# Patient Record
Sex: Female | Born: 1955 | Race: Black or African American | Hispanic: No | Smoking: Former smoker
Health system: Southern US, Community
[De-identification: ages and names within clinical notes are randomized; demographics above are authoritative.]

## PROBLEM LIST (undated history)

## (undated) DIAGNOSIS — D649 Anemia, unspecified: Secondary | ICD-10-CM

## (undated) DIAGNOSIS — F32A Depression, unspecified: Secondary | ICD-10-CM

## (undated) DIAGNOSIS — K219 Gastro-esophageal reflux disease without esophagitis: Secondary | ICD-10-CM

## (undated) DIAGNOSIS — B1920 Unspecified viral hepatitis C without hepatic coma: Secondary | ICD-10-CM

## (undated) DIAGNOSIS — C50919 Malignant neoplasm of unspecified site of unspecified female breast: Secondary | ICD-10-CM

## (undated) DIAGNOSIS — Z923 Personal history of irradiation: Secondary | ICD-10-CM

## (undated) DIAGNOSIS — K7460 Unspecified cirrhosis of liver: Secondary | ICD-10-CM

## (undated) DIAGNOSIS — G709 Myoneural disorder, unspecified: Secondary | ICD-10-CM

## (undated) DIAGNOSIS — F329 Major depressive disorder, single episode, unspecified: Secondary | ICD-10-CM

## (undated) DIAGNOSIS — I10 Essential (primary) hypertension: Secondary | ICD-10-CM

## (undated) DIAGNOSIS — F319 Bipolar disorder, unspecified: Secondary | ICD-10-CM

## (undated) DIAGNOSIS — I639 Cerebral infarction, unspecified: Secondary | ICD-10-CM

## (undated) DIAGNOSIS — M8580 Other specified disorders of bone density and structure, unspecified site: Secondary | ICD-10-CM

## (undated) DIAGNOSIS — M1990 Unspecified osteoarthritis, unspecified site: Secondary | ICD-10-CM

## (undated) DIAGNOSIS — F419 Anxiety disorder, unspecified: Secondary | ICD-10-CM

## (undated) DIAGNOSIS — R011 Cardiac murmur, unspecified: Secondary | ICD-10-CM

## (undated) HISTORY — DX: Bipolar disorder, unspecified: F31.9

## (undated) HISTORY — DX: Anxiety disorder, unspecified: F41.9

## (undated) HISTORY — PX: COLONOSCOPY: SHX174

## (undated) HISTORY — DX: Depression, unspecified: F32.A

## (undated) HISTORY — DX: Gastro-esophageal reflux disease without esophagitis: K21.9

## (undated) HISTORY — DX: Cardiac murmur, unspecified: R01.1

## (undated) HISTORY — DX: Personal history of irradiation: Z92.3

## (undated) HISTORY — DX: Major depressive disorder, single episode, unspecified: F32.9

## (undated) HISTORY — DX: Essential (primary) hypertension: I10

## (undated) HISTORY — DX: Unspecified osteoarthritis, unspecified site: M19.90

## (undated) HISTORY — PX: OTHER SURGICAL HISTORY: SHX169

## (undated) HISTORY — DX: Malignant neoplasm of unspecified site of unspecified female breast: C50.919

## (undated) HISTORY — DX: Anemia, unspecified: D64.9

## (undated) HISTORY — DX: Other specified disorders of bone density and structure, unspecified site: M85.80

---

## 1983-02-10 HISTORY — PX: DIAGNOSTIC LAPAROSCOPY: SUR761

## 2002-11-22 ENCOUNTER — Other Ambulatory Visit: Admission: RE | Admit: 2002-11-22 | Discharge: 2002-11-22 | Payer: Self-pay | Admitting: Obstetrics and Gynecology

## 2005-08-03 ENCOUNTER — Emergency Department (HOSPITAL_COMMUNITY): Admission: EM | Admit: 2005-08-03 | Discharge: 2005-08-03 | Payer: Self-pay | Admitting: Emergency Medicine

## 2005-10-13 ENCOUNTER — Inpatient Hospital Stay (HOSPITAL_COMMUNITY): Admission: AD | Admit: 2005-10-13 | Discharge: 2005-10-17 | Payer: Self-pay | Admitting: Psychiatry

## 2005-10-13 ENCOUNTER — Ambulatory Visit: Payer: Self-pay | Admitting: Psychiatry

## 2005-11-13 ENCOUNTER — Ambulatory Visit: Payer: Self-pay | Admitting: *Deleted

## 2005-12-11 ENCOUNTER — Ambulatory Visit: Payer: Self-pay | Admitting: Nurse Practitioner

## 2006-02-11 ENCOUNTER — Ambulatory Visit: Payer: Self-pay | Admitting: Internal Medicine

## 2006-03-26 ENCOUNTER — Encounter (INDEPENDENT_AMBULATORY_CARE_PROVIDER_SITE_OTHER): Payer: Self-pay | Admitting: *Deleted

## 2006-03-26 ENCOUNTER — Ambulatory Visit: Payer: Self-pay | Admitting: Nurse Practitioner

## 2006-04-01 ENCOUNTER — Ambulatory Visit (HOSPITAL_COMMUNITY): Admission: RE | Admit: 2006-04-01 | Discharge: 2006-04-01 | Payer: Self-pay | Admitting: Family Medicine

## 2006-04-19 ENCOUNTER — Ambulatory Visit: Payer: Self-pay | Admitting: Nurse Practitioner

## 2006-05-21 ENCOUNTER — Other Ambulatory Visit: Admission: RE | Admit: 2006-05-21 | Discharge: 2006-05-21 | Payer: Self-pay | Admitting: Family Medicine

## 2006-05-21 ENCOUNTER — Ambulatory Visit: Payer: Self-pay | Admitting: Family Medicine

## 2006-05-21 ENCOUNTER — Encounter: Payer: Self-pay | Admitting: Family Medicine

## 2006-05-25 ENCOUNTER — Encounter (INDEPENDENT_AMBULATORY_CARE_PROVIDER_SITE_OTHER): Payer: Self-pay | Admitting: Specialist

## 2006-06-03 ENCOUNTER — Ambulatory Visit: Payer: Self-pay | Admitting: Obstetrics & Gynecology

## 2006-06-11 ENCOUNTER — Ambulatory Visit (HOSPITAL_COMMUNITY): Admission: RE | Admit: 2006-06-11 | Discharge: 2006-06-11 | Payer: Self-pay | Admitting: Family Medicine

## 2006-07-26 ENCOUNTER — Ambulatory Visit: Payer: Self-pay | Admitting: Internal Medicine

## 2006-09-11 ENCOUNTER — Inpatient Hospital Stay (HOSPITAL_COMMUNITY): Admission: EM | Admit: 2006-09-11 | Discharge: 2006-09-12 | Payer: Self-pay | Admitting: Emergency Medicine

## 2006-09-12 ENCOUNTER — Inpatient Hospital Stay (HOSPITAL_COMMUNITY): Admission: AD | Admit: 2006-09-12 | Discharge: 2006-09-17 | Payer: Self-pay | Admitting: Psychiatry

## 2006-09-12 ENCOUNTER — Ambulatory Visit: Payer: Self-pay | Admitting: Psychiatry

## 2006-09-16 ENCOUNTER — Emergency Department (HOSPITAL_COMMUNITY): Admission: RE | Admit: 2006-09-16 | Discharge: 2006-09-16 | Payer: Self-pay | Admitting: *Deleted

## 2006-09-28 ENCOUNTER — Ambulatory Visit: Payer: Self-pay | Admitting: Psychiatry

## 2006-10-07 ENCOUNTER — Ambulatory Visit: Payer: Self-pay | Admitting: Family Medicine

## 2006-10-27 ENCOUNTER — Encounter (INDEPENDENT_AMBULATORY_CARE_PROVIDER_SITE_OTHER): Payer: Self-pay | Admitting: *Deleted

## 2007-10-19 ENCOUNTER — Ambulatory Visit: Payer: Self-pay | Admitting: Internal Medicine

## 2007-12-07 ENCOUNTER — Inpatient Hospital Stay (HOSPITAL_COMMUNITY): Admission: EM | Admit: 2007-12-07 | Discharge: 2007-12-11 | Payer: Self-pay | Admitting: Emergency Medicine

## 2008-04-11 ENCOUNTER — Encounter (INDEPENDENT_AMBULATORY_CARE_PROVIDER_SITE_OTHER): Payer: Self-pay | Admitting: Internal Medicine

## 2008-04-11 ENCOUNTER — Ambulatory Visit: Payer: Self-pay | Admitting: Internal Medicine

## 2008-04-11 LAB — CONVERTED CEMR LAB
ALT: 23 units/L (ref 0–35)
Alkaline Phosphatase: 127 units/L — ABNORMAL HIGH (ref 39–117)
BUN: 17 mg/dL (ref 6–23)
CO2: 22 meq/L (ref 19–32)
Chloride: 105 meq/L (ref 96–112)
Creatinine, Ser: 1.01 mg/dL (ref 0.40–1.20)
Glucose, Bld: 111 mg/dL — ABNORMAL HIGH (ref 70–99)
HDL: 108 mg/dL (ref >39)
LDL Cholesterol: 99 mg/dL (ref 0–99)
Sodium: 142 meq/L (ref 135–145)
Total Bilirubin: 0.3 mg/dL (ref 0.3–1.2)
Total CHOL/HDL Ratio: 2.1

## 2008-04-13 ENCOUNTER — Ambulatory Visit (HOSPITAL_COMMUNITY): Admission: RE | Admit: 2008-04-13 | Discharge: 2008-04-13 | Payer: Self-pay | Admitting: Family Medicine

## 2008-04-19 ENCOUNTER — Ambulatory Visit (HOSPITAL_COMMUNITY): Admission: RE | Admit: 2008-04-19 | Discharge: 2008-04-19 | Payer: Self-pay | Admitting: Internal Medicine

## 2008-04-19 ENCOUNTER — Encounter: Payer: Self-pay | Admitting: Internal Medicine

## 2008-04-19 ENCOUNTER — Ambulatory Visit: Payer: Self-pay | Admitting: Internal Medicine

## 2008-06-12 ENCOUNTER — Encounter (INDEPENDENT_AMBULATORY_CARE_PROVIDER_SITE_OTHER): Payer: Self-pay | Admitting: Internal Medicine

## 2008-06-12 ENCOUNTER — Ambulatory Visit: Payer: Self-pay | Admitting: Internal Medicine

## 2008-06-12 ENCOUNTER — Other Ambulatory Visit: Admission: RE | Admit: 2008-06-12 | Discharge: 2008-06-12 | Payer: Self-pay | Admitting: Family Medicine

## 2008-06-12 LAB — CONVERTED CEMR LAB
Chlamydia, DNA Probe: NEGATIVE
GC Probe Amp, Genital: NEGATIVE

## 2009-03-14 ENCOUNTER — Ambulatory Visit: Payer: Self-pay | Admitting: Internal Medicine

## 2009-05-23 ENCOUNTER — Ambulatory Visit: Payer: Self-pay | Admitting: Internal Medicine

## 2009-06-24 ENCOUNTER — Ambulatory Visit: Payer: Self-pay | Admitting: Internal Medicine

## 2009-06-24 LAB — CONVERTED CEMR LAB
CO2: 21 meq/L (ref 19–32)
Creatinine, Ser: 1.06 mg/dL (ref 0.40–1.20)
LDL Cholesterol: 57 mg/dL (ref 0–99)
Sodium: 139 meq/L (ref 135–145)
Triglycerides: 76 mg/dL (ref <150)

## 2009-07-15 ENCOUNTER — Ambulatory Visit (HOSPITAL_COMMUNITY): Admission: RE | Admit: 2009-07-15 | Discharge: 2009-07-15 | Payer: Self-pay | Admitting: Internal Medicine

## 2009-11-08 ENCOUNTER — Encounter (INDEPENDENT_AMBULATORY_CARE_PROVIDER_SITE_OTHER): Payer: Self-pay | Admitting: *Deleted

## 2010-03-02 ENCOUNTER — Encounter: Payer: Self-pay | Admitting: Internal Medicine
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
## 2010-06-24 NOTE — H&P (Signed)
NAME:  Tammy [name], Tammy [name] NO.:  [id number]   MEDICAL RECORD NO.:  [id number]          PATIENT TYPE:  WOC   LOCATION:  WOC                          FACILITY:  [facility]   PHYSICIAN:  Della Goo, M.D. DATE OF BIRTH:  25-Jun-1955   DATE OF ADMISSION:  09/11/2006  DATE OF DISCHARGE:                              HISTORY & PHYSICAL   PRIMARY CARE PHYSICIAN:  Dr. Ronne Binning.   CHIEF COMPLAINT:  Dizziness.   HISTORY OF PRESENT ILLNESS:  This is a 55 year old female with a history  of bipolar disorder who was brought to the emergency department this  evening after being found by her family with decreased responsiveness.  Per report of her family, the patient had been taking increased doses of  Seroquel therapy.  Her.  Prescription had just been filled 7 days ago,  and the patient had taken 90 tablets of Seroquel and one week.  Her  prescribed dosage was Seroquel 200 mg 3 tablets p.o. q.h.s.  The patient  was evaluated in the emergency department and was initially obtunded.  However, became more responsive over time.  When interviewed, the  patient reported that she felt dizzy.  She reports taking elevated  dosages of the Seroquel secondary to feeling bad and took more medicine  to feel better.  She denies that this was any type of suicidal gesture.   PAST MEDICAL HISTORY:  1. Hypertension.  2. Bipolar disorder.   PAST SURGICAL HISTORY:  C-section times one.   MEDICATIONS:  1. Lamictal 25 mg one p.o. daily.  2. Cymbalta 30 mg two p.o. q.a.m. and one p.o. q.h.s.  3. Seroquel 200 mg 3 tablets p.o. q.h.s.  4. Hydrochlorothiazide 25 mg one p.o. q.a.m.  5. Ibuprofen 800 mg one p.o. t.i.d. p.r.n. pain taken with food.  6. Prempro one tablet p.o. daily.   ALLERGIES:  NO KNOWN DRUG ALLERGIES.   SOCIAL HISTORY:  Positive tobacco one half a pack per day.  No history  of alcohol usage.   FAMILY HISTORY:  Strongly paternal family history of hypertension and  diabetes.   REVIEW OF SYSTEMS:  Pertinent for mentioned above.  The patient denies  having any nausea, vomiting, diarrhea, chest pain, shortness of breath.   PHYSICAL EXAMINATION:  GENERAL:  This is a 55 year old, obese female,  currently awake and alert in no acute distress.  VITAL SIGNS:  Temperature 98.9, blood pressure 148/79, heart rate 91,  respirations 20, O2 saturations 100% on room air.  HEENT:  Normocephalic, atraumatic.  Positive arcus senilis present on  both eyes.  Pupils are equally round and reactive to light.  Extraocular  muscles are intact.  Funduscopic benign.  Oropharynx is clear.  NECK:  Supple range of motion.  No thyromegaly, adenopathy or jugular  venous distension.  CARDIOVASCULAR:  Regular rate and rhythm.  No murmurs, gallops or rubs.  LUNGS:  Clear to auscultation bilaterally.  ABDOMEN:  Positive bowel sounds, soft, nontender, nondistended.  EXTREMITIES: Without cyanosis, clubbing or edema.  NEUROLOGIC:  The patient is alert and oriented x3.  Cranial nerves are  intact.  There are no focal deficits.  LABORATORY STUDIES:  White blood cell count 8.1, hemoglobin 11.1,  hematocrit 33.6, MCV 85.5, platelets 248, neutrophils 77%, lymphocytes  18, sodium 137, potassium 3.4, chloride 101, CO2 27, BUN 11, creatinine  1.05, glucose 115, calcium 8.9, albumin 3.2, AST 24, ALT 22 alcohol  level less than 5.  Chest x-ray reveals borderline cardiomegaly, mild  vascular congestion, tiny nodule right midlung.   ASSESSMENT:  A 55 year old female being admitted with:  1. Altered mental status.  2. Self-induced over medication of Seroquel.  3. Hypertension.  4. Mild anemia.  5. Mild hypokalemia.  6. History of bipolar disorder.   PLAN:  The patient will be admitted to a telemetry area for cardiac  monitoring.  She will be placed on neurologic checks q.2 h and one-to-  one supervision has been ordered.  A psychiatric consultation will be  placed.  The patient will continue on her  regular medications except the  Seroquel has been placed on hold for now.  GI and DVT prophylaxis have  also been ordered.      Della Goo, M.D.  Electronically Signed     HJ/MEDQ  D:  09/11/2006  T:  09/11/2006  Job:  [id number]

## 2010-06-24 NOTE — Discharge Summary (Signed)
NAMEHAANI, BAKULA NO.:  [id number]   MEDICAL RECORD NO.:  [id number]          PATIENT TYPE:  INP   LOCATION:  3706                         FACILITY:  [facility]   PHYSICIAN:  Lonia Blood, M.D.       DATE OF BIRTH:  [birth year]   DATE OF ADMISSION:  09/10/2006  DATE OF DISCHARGE:                               DISCHARGE SUMMARY   PRIMARY CARE PHYSICIAN:  [facility].   DISCHARGE DIAGNOSES:  1. Unintentional Seroquel overdose, with oversedation - resolved.  2. Hypertension.  3. Bipolar disorder.  4. Cesarean section.  5. Mild hypokalemia, resolved.  6. Mild normocytic anemia.  Outpatient followup recommended for      complete workup.  7. Morbid obesity.   DISCHARGE MEDICATIONS:  1. Abilify 10 mg daily.  2. Norvasc 10 mg daily.  3. Hydrochlorothiazide 25 mg daily.  4. Potassium 10 mEq daily.  5. Cymbalta 60 mg in the morning, and 30 mg at bedtime.  6. Lamictal 25 mg daily.  7. Ibuprofen 800 mg q.8 h. as needed for pain.  8. Depakote 500 mg twice a day.  9. Prempro 0.3/1.5 mg by mouth daily.   CONDITION AT DISCHARGE:  Mrs. Boss has requested transfer to  [facility], and the transfer is processing at the time of my  dictation.   CONSULTATIONS:  The patient was seen in consultation by Dr. Geoffery Lyons  from psychiatry.   PROCEDURES:  No procedures were done.   HISTORY AND PHYSICAL:  Refer to the dictated H&P done by Dr. Della Goo on September 11, 2006.   HOSPITAL COURSE:  1. Unintentional overdose.  Mrs. Radcliffe is a 55 year old woman with      bipolar disorder who reported that she has been experiencing      increased anxiety.  She thought that Seroquel can be taken as a      p.r.n., so she escalated her dose to about 3000 mg.  She became      poorly responsive, so her daughter brought her to the hospital.  In      the emergency room, she was placed on intravenous fluids and      observed, and slowly but surely her mental status cleared.   She was      admitted to a telemetry floor to rule out any arrhythmias or QT      prolongation, and indeed she had an uneventful stay.  Upon fully      awakening, the patient vigorously denied any suicidal intention.      Given the fact that the patient's behavior was very concerning for      a potential fatal event in the future, Mrs. Jahr was seen by      Dr. Geoffery Lyons from psychiatry, who offered the patient inpatient      evaluation.  The patient initially denied inpatient transfer to      [facility], but now she has changed her mind, and she would      like to go and have a full evaluation in the inpatient setting.  2. Hypertension.  We have noticed  on the patient's diuretic her blood      pressure is poorly controlled.  We have added Norvasc to her      current treatment.  3. Obesity.  We do feel that Mrs. Unangst's weight gain could be      related to her Seroquel.  We have switched this to Abilify at the      recommendation of Dr. Dub Mikes.  We have also provided the patient with      extensive education in regard to diet and exercise.      Lonia Blood, M.D.  Electronically Signed     SL/MEDQ  D:  09/12/2006  T:  09/12/2006  Job:  [id number]   cc:   Dala Dock

## 2010-06-27 NOTE — Discharge Summary (Signed)
NAMEMarland [name]  Tammy, [name] NO.:  [id number]   MEDICAL RECORD NO.:  [id number]          PATIENT TYPE:  IPS   LOCATION:  0301                          [facility]   PHYSICIAN:  Anselm Jungling, MD  DATE OF BIRTH:  07/12/1955   DATE OF ADMISSION:  10/13/2005  DATE OF DISCHARGE:  10/17/2005                                 DISCHARGE SUMMARY   IDENTIFYING DATA/REASON FOR ADMISSION:  This was an inpatient psychiatric  admission for Tammy [name], a 55 year old separated African American female,  unemployed, admitted with depression and suicidal ideation.  She reported a  previous history of diagnosis of bipolar disorder, but had not been on her  medications for many months.  She also admitted to alcohol abuse.  Please  refer to the admission note for further details pertaining to the symptoms,  circumstances and history that led to her hospitalization.   INITIAL DIAGNOSTIC IMPRESSION:  She was given an initial AXIS I diagnosis of  mood disorder not otherwise specified and alcohol abuse.   MEDICAL/LABORATORY:  The patient was medically and physically assessed by  the psychiatric nurse practitioner.  She came to [place] with a history of  hypertension and GERD, with chronic back pain.  She was continued on  hydrochlorothiazide 25 mg daily, Protonix 40 mg daily, and lidocaine dermal  patch q.12h.  There were no acute medical issues.   HOSPITAL COURSE:  The patient was admitted to the adult inpatient  psychiatric service.  She presented as a well-nourished, well-developed  [place] female who was pleasant, well-organized, but depressed with  sad affect.  She denied active suicidal ideation and verbalized a strong  desire for help.  Her thoughts and speech were within normal limits.  There  was nothing to suggest any signs or symptoms of psychosis.  She was  nontremulous.   The patient was admitted to the adult inpatient psychiatric service.  She  reported that she had  previously done well on the antidepressant Cymbalta.  This was restarted at 30 mg daily and was well-tolerated.   She was also given a trial of Seroquel which she had had previously to  stabilize her bipolar condition, along with Depakote.  These were well-  tolerated also.   The patient was a pleasant and active participant in the treatment program.  By the third day, the patient indicated that she was feeling much better.  Her mood and affect were brighter.  She had remained consistently absent  suicidal ideation since her admission.  Her sleeping was stable.   On the final hospital day, October 17, 2005, there was a family session  involving her daughter and grandson.  The patient spoke about going home  with her sister and aftercare outpatient treatment following her inpatient  discharge.  The patient was able to identify multiple supports in her  family, and getting medication through the [facility].  She also  identified a plan to use the 12-step support groups.  Following this  meeting, the patient was discharged.   AFTERCARE:  The patient was to follow up with an appointment at the [facility] with Dr. Lang Snow on October 22, 2005.   DISCHARGE MEDICATIONS:  1. Hydrochlorothiazide 25 mg daily.  2. Protonix 40 mg daily.  3. Seroquel 200 mg q.h.s.  4. Depakote ER 500 mg b.i.d.  5. Lidocaine 5% patch q.12h.  6. Cymbalta 30 mg daily.   DISCHARGE DIAGNOSES:  AXIS I:  Mood disorder not otherwise specified.  Alcohol abuse.  AXIS II:  Deferred.  AXIS III:  History of hypertension, gastroesophageal reflux disease, back  pain.  AXIS IV:  Stressors:  Severe.  AXIS V:  GAF on discharge 65.      Anselm Jungling, MD  Electronically Signed     SPB/MEDQ  D:  10/23/2005  T:  10/23/2005  Job:  [id number]
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
## 2010-11-10 LAB — POCT I-STAT, CHEM 8
BUN: 11
Chloride: 115 — ABNORMAL HIGH
Creatinine, Ser: 1.4 — ABNORMAL HIGH
Hemoglobin: 12.9
Potassium: 3.4 — ABNORMAL LOW
Sodium: 146 — ABNORMAL HIGH

## 2010-11-10 LAB — DIFFERENTIAL
Lymphocytes Relative: 21
Lymphs Abs: 2.4
Neutrophils Relative %: 75

## 2010-11-10 LAB — CBC
HCT: 37.2
Platelets: 279
Platelets: 314
WBC: 11.1 — ABNORMAL HIGH
WBC: 9.3

## 2010-11-10 LAB — URINALYSIS, ROUTINE W REFLEX MICROSCOPIC
Glucose, UA: NEGATIVE
Hgb urine dipstick: NEGATIVE
Ketones, ur: NEGATIVE
Protein, ur: 30 — AB
Urobilinogen, UA: 0.2

## 2010-11-10 LAB — POCT I-STAT 3, ART BLOOD GAS (G3+)
Acid-base deficit: 2
pCO2 arterial: 49.4 — ABNORMAL HIGH
pO2, Arterial: 136 — ABNORMAL HIGH

## 2010-11-10 LAB — BASIC METABOLIC PANEL
BUN: 7
GFR calc non Af Amer: >60
Potassium: 3.7

## 2010-11-10 LAB — POCT CARDIAC MARKERS
CKMB, poc: 2.6
Troponin i, poc: <0.05

## 2010-11-10 LAB — ETHANOL: Alcohol, Ethyl (B): 244 — ABNORMAL HIGH

## 2010-11-10 LAB — URINE MICROSCOPIC-ADD ON

## 2010-11-24 LAB — CBC
HCT: 33.6 — ABNORMAL LOW
HCT: 36.1
Hemoglobin: 10.7 — ABNORMAL LOW
Hemoglobin: 11.1 — ABNORMAL LOW
Hemoglobin: 12.1
MCHC: 32.9
MCHC: 33.1
MCHC: 33.4
MCV: 85.5
MCV: 85.8
RBC: 3.76 — ABNORMAL LOW
RDW: 13.4
RDW: 14.2 — ABNORMAL HIGH
RDW: 14.4 — ABNORMAL HIGH

## 2010-11-24 LAB — DIFFERENTIAL
Basophils Relative: 0
Lymphs Abs: 1.5
Monocytes Relative: 4
Neutro Abs: 6.2
Neutrophils Relative %: 77

## 2010-11-24 LAB — HEPATIC FUNCTION PANEL
Albumin: 3 — ABNORMAL LOW
Alkaline Phosphatase: 82
Alkaline Phosphatase: 85
Bilirubin, Direct: <0.1
Indirect Bilirubin: 0.2 — ABNORMAL LOW
Total Bilirubin: 0.3
Total Bilirubin: 0.5

## 2010-11-24 LAB — CK TOTAL AND CKMB (NOT AT ARMC)
CK, MB: 1.6
Relative Index: 1
Total CK: 167

## 2010-11-24 LAB — RAPID URINE DRUG SCREEN, HOSP PERFORMED
Barbiturates: NOT DETECTED
Benzodiazepines: POSITIVE — AB
Cocaine: NOT DETECTED
Opiates: NOT DETECTED

## 2010-11-24 LAB — BASIC METABOLIC PANEL
Calcium: 8.9
Creatinine, Ser: 0.97
GFR calc Af Amer: >60
GFR calc non Af Amer: >60
Glucose, Bld: 110 — ABNORMAL HIGH
Sodium: 141

## 2010-11-24 LAB — COMPREHENSIVE METABOLIC PANEL
BUN: 11
Calcium: 8.9
Creatinine, Ser: 1.05
GFR calc non Af Amer: 55 — ABNORMAL LOW
Glucose, Bld: 115 — ABNORMAL HIGH
Total Protein: 6.5

## 2010-11-24 LAB — CARDIAC PANEL(CRET KIN+CKTOT+MB+TROPI)
CK, MB: 1.5
Relative Index: 0.7
Troponin I: 0.01

## 2010-11-24 LAB — ACETAMINOPHEN LEVEL: Acetaminophen (Tylenol), Serum: <10 — ABNORMAL LOW

## 2010-11-24 LAB — TSH: TSH: 0.817

## 2012-01-25 ENCOUNTER — Encounter: Payer: Self-pay | Admitting: Internal Medicine

## 2012-02-22 ENCOUNTER — Encounter: Payer: Self-pay | Admitting: Internal Medicine

## 2012-02-22 ENCOUNTER — Ambulatory Visit: Payer: Self-pay | Admitting: Internal Medicine

## 2012-02-22 ENCOUNTER — Ambulatory Visit (INDEPENDENT_AMBULATORY_CARE_PROVIDER_SITE_OTHER): Payer: Medicare Other | Admitting: Internal Medicine

## 2012-02-22 VITALS — BP 126/90 | HR 60 | Ht 62.0 in | Wt 173.4 lb

## 2012-02-22 DIAGNOSIS — R195 Other fecal abnormalities: Secondary | ICD-10-CM

## 2012-02-22 DIAGNOSIS — K219 Gastro-esophageal reflux disease without esophagitis: Secondary | ICD-10-CM

## 2012-02-22 MED ORDER — MOVIPREP 100 G PO SOLR: 1.0000 | Freq: Once | ORAL | Status: DC

## 2012-02-22 NOTE — Progress Notes (Signed)
HISTORY OF PRESENT ILLNESS:  Tammy [name] is a 57 y.o. female , new to GI, with a history of hypertension, bipolar disorder, arthritis, and GERD. She is disabled due to bipolar disorder and chronic back pain secondary to work-related injury. She presents today regarding Hemoccult-positive stool on routine outpatient testing. She was notified by her primary provider and this office appointment set up. Review of outside records shows blood work from September 2013. Normal CBC. Comprehensive metabolic panel with elevated hepatic transaminases. TSH and Helicobacter pylori antibody unremarkable. Hemoccult studies positive x2. She has chronic GERD for which she stays on pantoprazole. No dysphagia. She does take NSAIDs. 20 pound weight gain over the past year. History of colon cancer in her uncle. GI review of systems otherwise negative.  REVIEW OF SYSTEMS:  All non-GI ROS negative except for anxiety, depression, fatigue, back pain, arthritis, sleeping problems, night sweats  Past Medical History  Diagnosis Date  . Arthritis   . Depression   . GERD (gastroesophageal reflux disease)   . HTN (hypertension)   . Bipolar disorder     Past Surgical History  Procedure Date  . Cesarean section 1986    Social History Tammy [name]  reports that she has been smoking Cigarettes.  She has a 18.5 pack-year smoking history. She has never used smokeless tobacco. She reports that she drinks alcohol. She reports that she does not use illicit drugs.  family history includes Breast cancer in her maternal aunt, mother, and paternal aunt; Colon cancer in her paternal uncle; and Diabetes in her brother, paternal aunt, and sister.  No Known Allergies     PHYSICAL EXAMINATION: Vital signs: BP 126/90  Pulse 60  Ht 5\' 2"  (1.575 m)  Wt 173 lb 6 oz (78.642 kg)  BMI 31.71 kg/m2  Constitutional: generally well-appearing, no acute distress Psychiatric: alert and oriented x3, cooperative Eyes: extraocular  movements intact, anicteric, conjunctiva pink. Bilateral cataracts Mouth: oral pharynx moist, no lesions Neck: supple no lymphadenopathy Cardiovascular: heart regular rate and rhythm, no murmur Lungs: clear to auscultation bilaterally Abdomen: soft, nontender, nondistended, no obvious ascites, no peritoneal signs, normal bowel sounds, no organomegaly Rectal: Deferred until colonoscopy Extremities: no lower extremity edema bilaterally Skin: no lesions on visible extremities Neuro: No focal deficits. No asterixis.     ASSESSMENT:  #1. Hemoccult-positive stool. Rule out GI mucosal lesions such as NSAID related, GERD related, or neoplasia #2. Chronic GERD #3. Colon cancer screening   PLAN:  #1. Colonoscopy and upper endoscopy.The nature of the procedure, as well as the risks, benefits, and alternatives were carefully and thoroughly reviewed with the patient. Ample time for discussion and questions allowed. The patient understood, was satisfied, and agreed to proceed.  #2. Reflux precautions #3. Continue PPI #4. Movi prep prescribed. The patient instructed on its use

## 2012-02-22 NOTE — Patient Instructions (Addendum)
You have been scheduled for an endoscopy and colonoscopy with propofol. Please follow the written instructions given to you at your visit today. Please pick up your prep at the pharmacy within the next 1-3 days. If you use inhalers (even only as needed) or a CPAP machine, please bring them with you on the day of your procedure. 

## 2012-03-29 ENCOUNTER — Telehealth: Payer: Self-pay | Admitting: Internal Medicine

## 2012-03-30 NOTE — Telephone Encounter (Signed)
No charge christy. Tammy [name], advise the patient to see her primary care physician regarding her depression. Hopefully she will be able to reschedule her procedures, as I think there are important for the reasons we discussed in the office. Thank you

## 2012-03-30 NOTE — Telephone Encounter (Signed)
Left message for pt to call back  °

## 2012-03-31 ENCOUNTER — Encounter: Payer: Medicare Other | Admitting: Internal Medicine

## 2012-04-01 NOTE — Telephone Encounter (Signed)
Left message for pt to call back  °

## 2012-04-06 NOTE — Telephone Encounter (Signed)
Left message for pt to call back.   Have been unable to reach pt. Letter mailed to pt.

## 2012-09-09 DIAGNOSIS — I639 Cerebral infarction, unspecified: Secondary | ICD-10-CM

## 2012-09-09 HISTORY — DX: Cerebral infarction, unspecified: I63.9

## 2012-10-04 ENCOUNTER — Emergency Department (HOSPITAL_COMMUNITY): Payer: Medicare Other

## 2012-10-04 ENCOUNTER — Encounter (HOSPITAL_COMMUNITY): Payer: Self-pay | Admitting: Emergency Medicine

## 2012-10-04 ENCOUNTER — Inpatient Hospital Stay (HOSPITAL_COMMUNITY): Payer: Medicare Other

## 2012-10-04 ENCOUNTER — Inpatient Hospital Stay (HOSPITAL_COMMUNITY)
Admission: EM | Admit: 2012-10-04 | Discharge: 2012-10-06 | DRG: 065 | Disposition: A | Discharge status: Home / Self Care | Payer: Medicare Other | Attending: Internal Medicine | Admitting: Internal Medicine

## 2012-10-04 DIAGNOSIS — M129 Arthropathy, unspecified: Secondary | ICD-10-CM | POA: Diagnosis present

## 2012-10-04 DIAGNOSIS — F411 Generalized anxiety disorder: Secondary | ICD-10-CM | POA: Diagnosis present

## 2012-10-04 DIAGNOSIS — I6350 Cerebral infarction due to unspecified occlusion or stenosis of unspecified cerebral artery: Principal | ICD-10-CM | POA: Diagnosis present

## 2012-10-04 DIAGNOSIS — R4789 Other speech disturbances: Secondary | ICD-10-CM | POA: Diagnosis present

## 2012-10-04 DIAGNOSIS — K219 Gastro-esophageal reflux disease without esophagitis: Secondary | ICD-10-CM | POA: Diagnosis present

## 2012-10-04 DIAGNOSIS — M545 Low back pain, unspecified: Secondary | ICD-10-CM | POA: Diagnosis present

## 2012-10-04 DIAGNOSIS — I10 Essential (primary) hypertension: Secondary | ICD-10-CM | POA: Diagnosis present

## 2012-10-04 DIAGNOSIS — G4700 Insomnia, unspecified: Secondary | ICD-10-CM | POA: Diagnosis present

## 2012-10-04 DIAGNOSIS — G8929 Other chronic pain: Secondary | ICD-10-CM | POA: Diagnosis present

## 2012-10-04 DIAGNOSIS — Z79899 Other long term (current) drug therapy: Secondary | ICD-10-CM

## 2012-10-04 DIAGNOSIS — E875 Hyperkalemia: Secondary | ICD-10-CM | POA: Diagnosis present

## 2012-10-04 DIAGNOSIS — I6789 Other cerebrovascular disease: Secondary | ICD-10-CM

## 2012-10-04 DIAGNOSIS — F17200 Nicotine dependence, unspecified, uncomplicated: Secondary | ICD-10-CM | POA: Diagnosis present

## 2012-10-04 DIAGNOSIS — R531 Weakness: Secondary | ICD-10-CM | POA: Diagnosis present

## 2012-10-04 DIAGNOSIS — G894 Chronic pain syndrome: Secondary | ICD-10-CM | POA: Diagnosis present

## 2012-10-04 DIAGNOSIS — M6281 Muscle weakness (generalized): Secondary | ICD-10-CM

## 2012-10-04 DIAGNOSIS — I639 Cerebral infarction, unspecified: Secondary | ICD-10-CM | POA: Diagnosis present

## 2012-10-04 DIAGNOSIS — F319 Bipolar disorder, unspecified: Secondary | ICD-10-CM | POA: Diagnosis present

## 2012-10-04 DIAGNOSIS — R29810 Facial weakness: Secondary | ICD-10-CM | POA: Diagnosis present

## 2012-10-04 DIAGNOSIS — G8190 Hemiplegia, unspecified affecting unspecified side: Secondary | ICD-10-CM | POA: Diagnosis present

## 2012-10-04 DIAGNOSIS — R4781 Slurred speech: Secondary | ICD-10-CM | POA: Diagnosis present

## 2012-10-04 DIAGNOSIS — R209 Unspecified disturbances of skin sensation: Secondary | ICD-10-CM | POA: Diagnosis present

## 2012-10-04 HISTORY — DX: Cerebral infarction, unspecified: I63.9

## 2012-10-04 LAB — PROTIME-INR: Prothrombin Time: 16.3 seconds — ABNORMAL HIGH (ref 11.6–15.2)

## 2012-10-04 LAB — COMPREHENSIVE METABOLIC PANEL
ALT: 93 U/L — ABNORMAL HIGH (ref 0–35)
BUN: 13 mg/dL (ref 6–23)
Calcium: 9.3 mg/dL (ref 8.4–10.5)
Creatinine, Ser: 0.69 mg/dL (ref 0.50–1.10)
GFR calc Af Amer: >90 mL/min (ref >90)
Glucose, Bld: 108 mg/dL — ABNORMAL HIGH (ref 70–99)
Sodium: 141 mEq/L (ref 135–145)
Total Protein: 8 g/dL (ref 6.0–8.3)

## 2012-10-04 LAB — ANTITHROMBIN III: AntiThromb III Func: 83 % (ref 75–120)

## 2012-10-04 LAB — DIFFERENTIAL
Eosinophils Absolute: 0.1 10*3/uL (ref 0.0–0.7)
Eosinophils Relative: 2 % (ref 0–5)
Lymphs Abs: 1.7 10*3/uL (ref 0.7–4.0)

## 2012-10-04 LAB — CBC
MCH: 29.7 pg (ref 26.0–34.0)
MCV: 90.2 fL (ref 78.0–100.0)
Platelets: 163 10*3/uL (ref 150–400)
RDW: 14 % (ref 11.5–15.5)

## 2012-10-04 LAB — TROPONIN I: Troponin I: <0.3 ng/mL (ref <0.30)

## 2012-10-04 MED ORDER — ACETAMINOPHEN 325 MG PO TABS
650.0000 mg | ORAL_TABLET | Freq: Once | ORAL | Status: AC
  Administered 2012-10-04: 650 mg via ORAL
  Filled 2012-10-04: qty 2

## 2012-10-04 MED ORDER — ASPIRIN 325 MG PO TABS
325.0000 mg | ORAL_TABLET | Freq: Every day | ORAL | Status: DC
  Administered 2012-10-04 – 2012-10-06 (×3): 325 mg via ORAL
  Filled 2012-10-04 (×5): qty 1

## 2012-10-04 MED ORDER — ASPIRIN 300 MG RE SUPP
300.0000 mg | Freq: Every day | RECTAL | Status: DC
  Filled 2012-10-04 (×3): qty 1

## 2012-10-04 MED ORDER — HYDRALAZINE HCL 20 MG/ML IJ SOLN
5.0000 mg | Freq: Four times a day (QID) | INTRAMUSCULAR | Status: DC | PRN
  Administered 2012-10-05: 5 mg via INTRAVENOUS
  Filled 2012-10-04: qty 1

## 2012-10-04 MED ORDER — PANTOPRAZOLE SODIUM 40 MG PO TBEC
40.0000 mg | DELAYED_RELEASE_TABLET | Freq: Every day | ORAL | Status: DC
  Administered 2012-10-04 – 2012-10-06 (×3): 40 mg via ORAL
  Filled 2012-10-04 (×2): qty 1

## 2012-10-04 MED ORDER — HEPARIN SODIUM (PORCINE) 5000 UNIT/ML IJ SOLN
5000.0000 [IU] | Freq: Three times a day (TID) | INTRAMUSCULAR | Status: DC
  Administered 2012-10-04 – 2012-10-06 (×6): 5000 [IU] via SUBCUTANEOUS
  Filled 2012-10-04 (×9): qty 1

## 2012-10-04 MED ORDER — ATENOLOL 25 MG PO TABS
25.0000 mg | ORAL_TABLET | Freq: Two times a day (BID) | ORAL | Status: DC
  Administered 2012-10-05 – 2012-10-06 (×4): 25 mg via ORAL
  Filled 2012-10-04 (×5): qty 1

## 2012-10-04 MED ORDER — ACETAMINOPHEN 325 MG PO TABS
650.0000 mg | ORAL_TABLET | ORAL | Status: DC | PRN
  Administered 2012-10-04 – 2012-10-06 (×3): 650 mg via ORAL
  Filled 2012-10-04 (×3): qty 2

## 2012-10-04 MED ORDER — ACETAMINOPHEN 650 MG RE SUPP: 650.0000 mg | RECTAL | Status: DC | PRN

## 2012-10-04 NOTE — ED Notes (Signed)
REPORT TO ZARA ON [address] NOR[address]

## 2012-10-04 NOTE — H&P (Signed)
[facility] History and Physical  Tammy [name] ZOX:[id number] DOB: 1955/10/07 DOA: 10/04/2012  Referring physician: Arnoldo Hooker, PA-C PCP: Quitman Livings, MD  Specialists:  Chief Complaint: Slurred speech and left-sided weakness  HPI: Tammy [name] is a 57 y.o. female with past medical history of hypertension and arthritis. Patient came in to the hospital because of slurred speech. Patient said since Friday she noticed slurred speech and left facial droop. There is also tingling over her left arm. Her symptoms resolved on Saturday and came back on Sunday, this is also resolved and when she woke up today she woke up with more slurred speech so she came into the ED for further evaluation. She denies any headache, any visual changes or confusion. Patient mentioned that she has sister recently diagnosed with protein C deficiency and renal infarct. Initial evaluation in the ED with the CT scan of the head showed new hypodense area in the right basal ganglia and periventricular white matter which could reflect acute subacute infarct.  Review of Systems:  Constitutional: negative for anorexia, fevers and sweats Eyes: negative for irritation, redness and visual disturbance Ears, nose, mouth, throat, and face: negative for earaches, epistaxis, nasal congestion and sore throat Respiratory: negative for cough, dyspnea on exertion, sputum and wheezing Cardiovascular: negative for chest pain, dyspnea, lower extremity edema, orthopnea, palpitations and syncope Gastrointestinal: negative for abdominal pain, constipation, diarrhea, melena, nausea and vomiting Genitourinary:negative for dysuria, frequency and hematuria Hematologic/lymphatic: negative for bleeding, easy bruising and lymphadenopathy Musculoskeletal:negative for arthralgias, muscle weakness and stiff joints Neurological: Per HPI Endocrine: negative for diabetic symptoms including polydipsia, polyuria and weight  loss Allergic/Immunologic: negative for anaphylaxis, hay fever and urticaria  Past Medical History  Diagnosis Date  . Arthritis   . Depression   . GERD (gastroesophageal reflux disease)   . HTN (hypertension)   . Bipolar disorder    Past Surgical History  Procedure Laterality Date  . Cesarean section  1986   Social History:  reports that she has been smoking Cigarettes.  She has a 18.5 pack-year smoking history. She has never used smokeless tobacco. She reports that  drinks alcohol. She reports that she does not use illicit drugs.   No Known Allergies  Family History  Problem Relation Age of Onset  . Breast cancer Mother   . Diabetes Brother   . Diabetes Sister   . Diabetes Paternal Aunt   . Breast cancer Maternal Aunt   . Breast cancer Paternal Aunt   . Colon cancer Paternal Uncle     Prior to Admission medications   Medication Sig Start Date End Date Taking? Authorizing Provider  atenolol (TENORMIN) 25 MG tablet Take 25 mg by mouth 2 (two) times daily.    Yes Historical Provider, MD  Ibuprofen-Diphenhydramine Cit (MOTRIN PM) 200-38 MG TABS Take 1 tablet by mouth at bedtime as needed (sleep/pain).   Yes Historical Provider, MD  lansoprazole (PREVACID) 15 MG capsule Take 15 mg by mouth once.   Yes Historical Provider, MD  pantoprazole (PROTONIX) 40 MG tablet Take 40 mg by mouth daily.   Yes Historical Provider, MD   Physical Exam: Filed Vitals:   10/04/12 1130  BP: 160/81  Pulse: 65  Temp:   Resp: 16   General appearance: alert, cooperative and no distress  Head: Normocephalic, without obvious abnormality, atraumatic  Eyes: conjunctivae/corneas clear. PERRL, EOM's intact. Fundi benign.  Nose: Nares normal. Septum midline. Mucosa normal. No drainage or sinus tenderness.  Throat: lips, mucosa, and tongue normal; teeth and gums  normal  Neck: Supple, no masses, no cervical lymphadenopathy, no JVD appreciated, no meningeal signs Resp: clear to auscultation bilaterally   Chest wall: no tenderness  Cardio: regular rate and rhythm, S1, S2 normal, no murmur, click, rub or gallop  GI: soft, non-tender; bowel sounds normal; no masses, no organomegaly  Extremities: extremities normal, atraumatic, no cyanosis or edema  Skin: Skin color, texture, turgor normal. No rashes or lesions  Neurologic: Slurred speech when she talks quickly, left-sided 4/5 weakness   Labs on Admission:  Basic Metabolic Panel:  Recent Labs Lab 10/04/12 1116  NA 139  K 6.8*  CL 109  GLUCOSE 122*  BUN 20  CREATININE 0.90   Liver Function Tests: No results found for this basename: AST, ALT, ALKPHOS, BILITOT, PROT, ALBUMIN,  in the last 168 hours No results found for this basename: LIPASE, AMYLASE,  in the last 168 hours No results found for this basename: AMMONIA,  in the last 168 hours CBC:  Recent Labs Lab 10/04/12 1108 10/04/12 1116  WBC 5.1  --   NEUTROABS 3.0  --   HGB 13.0 14.3  HCT 39.5 42.0  MCV 90.2  --   PLT 163  --    Cardiac Enzymes: No results found for this basename: CKTOTAL, CKMB, CKMBINDEX, TROPONINI,  in the last 168 hours  BNP (last 3 results) No results found for this basename: PROBNP,  in the last 8760 hours CBG: No results found for this basename: GLUCAP,  in the last 168 hours  Radiological Exams on Admission: Ct Head (brain) Wo Contrast  10/04/2012   CLINICAL DATA:  Left-sided weakness, slurred speech, facial droop.  EXAM: CT HEAD WITHOUT CONTRAST  TECHNIQUE: Contiguous axial images were obtained from the base of the skull through the vertex without intravenous contrast.  COMPARISON:  12/07/2007  FINDINGS: Hypodensity in the right basal ganglia and periventricular white matter, new since 2009. Cannot exclude acute to subacute infarct. No hemorrhage or hydrocephalus. No mass lesion or midline shift. No acute calvarial abnormality. Visualized paranasal sinuses and mastoids clear. Orbital soft tissues unremarkable.  IMPRESSION: New hypodense area in  the right basal ganglia and periventricular white matter which could reflect acute to subacute infarct.   Electronically Signed   By: Charlett Nose   On: 10/04/2012 11:26    EKG: Independently reviewed.   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 -Patient seen by neurology in the emergency department no tPA given because of symptoms onset about 3 days ago. -Obtain MRI/MRA of the brain without contrast. -2-D echocardiogram, carotid duplex, telemetry monitoring and 12-lead EKG. -Check hemoglobin A1c, fasting lipid profile. -Because of patient's age and family history hypercoagulability panel ordered. -Aspirin, PT/OT/SLP.  Hyperkalemia -No evidence of ARF, repeat BMP. -If hyperkalemia is true, Kayexalate it will be given.  Hypertension -Patient is on atenolol 25 mg by mouth twice a day, restart. -Hydralazine IV when necessary for systolic blood pressure more than 160.   Code Status: Full code Family Communication: Plan D/W the pt with her daughter at bedside. Disposition Plan: Inpatient, neuro telemetry.  Time spent: 70 minutes  [facility] A [facility] Pager [phone]  If 7PM-7AM, please contact night-coverage [email or web address] Password [email or web address] 10/04/2012, 1:33 PM

## 2012-10-04 NOTE — ED Notes (Addendum)
Results of chem 8 shown to [employer], PA-C

## 2012-10-04 NOTE — ED Notes (Signed)
Pt reports she had slurred speech and difficulty swallow and along with drooling out of left side of mouth, sts she is also feeling generalized weakness, sts this has all been intermittent since Friday but noticed it was worse this morning. sts last night she was feeling fatigued but this morning noticed she was even more tired and having difficulty speaking.

## 2012-10-04 NOTE — ED Provider Notes (Signed)
CSN: [id number]     Arrival date & time 10/04/12  1025 History   First MD Initiated Contact with Patient 10/04/12 1109     Chief Complaint  Patient presents with  . Aphasia  . Fatigue   (Consider location/radiation/quality/duration/timing/severity/associated sxs/prior Treatment) Patient is a 57 y.o. female presenting with weakness. The history is provided by the patient. No language interpreter was used.  Weakness This is a new problem. The current episode started in the past 7 days. Associated symptoms include fatigue, nausea and weakness. Pertinent negatives include no abdominal pain, chest pain, chills, fever, headaches, myalgias, neck pain, numbness or vomiting. Associated symptoms comments: She presents with symptoms of expressive aphasia that started 4 days ago as intermittent symptoms, now more constant. She has some drooling from the left mouth. No headache, fever, history of prior stroke or vision loss. She denies falls, imbalance or extremity weakness. .    Past Medical History  Diagnosis Date  . Arthritis   . Depression   . GERD (gastroesophageal reflux disease)   . HTN (hypertension)   . Bipolar disorder    Past Surgical History  Procedure Laterality Date  . Cesarean section  1986   Family History  Problem Relation Age of Onset  . Breast cancer Mother   . Diabetes Brother   . Diabetes Sister   . Diabetes Paternal Aunt   . Breast cancer Maternal Aunt   . Breast cancer Paternal Aunt   . Colon cancer Paternal Uncle    History  Substance Use Topics  . Smoking status: Current Every Day Smoker -- 0.50 packs/day for 37 years    Types: Cigarettes  . Smokeless tobacco: Never Used  . Alcohol Use: Yes     Comment: 1 per week   OB History   Grav Para Term Preterm Abortions TAB SAB Ect Mult Living                 Review of Systems  Constitutional: Positive for fatigue. Negative for fever and chills.  HENT: Negative.  Negative for neck pain.   Eyes: Negative for  visual disturbance.  Respiratory: Negative.  Negative for shortness of breath.   Cardiovascular: Negative.  Negative for chest pain.  Gastrointestinal: Positive for nausea. Negative for vomiting and abdominal pain.  Genitourinary: Negative.  Negative for dysuria.  Musculoskeletal: Negative.  Negative for myalgias.  Skin: Negative.   Neurological: Positive for speech difficulty and weakness. Negative for numbness and headaches.    Allergies  Review of patient's allergies indicates no known allergies.  Home Medications   Current Outpatient Rx  Name  Route  Sig  Dispense  Refill  . atenolol (TENORMIN) 25 MG tablet   Oral   Take 25 mg by mouth daily.         [name] HYDROcodone-acetaminophen (NORCO/VICODIN) 5-325 MG per tablet   Oral   Take 1 tablet by mouth 2 (two) times daily as needed for pain.          . meloxicam (MOBIC) 15 MG tablet   Oral   Take 15 mg by mouth daily.         . pantoprazole (PROTONIX) 40 MG tablet   Oral   Take 40 mg by mouth daily.          BP 160/81  Pulse 65  Temp(Src) 98 F (36.7 C) (Oral)  Resp 16  Ht 5\' 2"  (1.575 m)  Wt 164 lb (74.39 kg)  BMI 29.99 kg/m2  SpO2 100% Physical Exam  Constitutional: She is oriented to person, place, and time. She appears well-developed and well-nourished.  HENT:  Head: Normocephalic.  Eyes: Pupils are equal, round, and reactive to light.  Neck: Normal range of motion. Neck supple.  Cardiovascular: Normal rate and regular rhythm.   Pulmonary/Chest: Effort normal and breath sounds normal.  Abdominal: Soft. Bowel sounds are normal. There is no tenderness. There is no rebound and no guarding.  Musculoskeletal: Normal range of motion. She exhibits no edema and no tenderness.  Neurological: She is alert and oriented to person, place, and time. She has normal strength and normal reflexes. No sensory deficit. She displays a negative Romberg sign. Coordination normal.  Left facial weakness most prominent periorally.  Speech minimally slurred, slightly delayed. Oriented x 3. Neurosensory exam is intact. UE grip strength equal and with deficit. Left lower extremity weakness on plantar and dorsiflexion. Reflexes hyporeflexic bilaterally.  Skin: Skin is warm and dry. No rash noted.  Psychiatric: She has a normal mood and affect.    ED Course  Procedures (including critical care time) Labs Review Labs Reviewed  PROTIME-INR - Abnormal; Notable for the following:    Prothrombin Time 16.3 (*)    All other components within normal limits  APTT - Abnormal; Notable for the following:    aPTT 39 (*)    All other components within normal limits  POCT I-STAT, CHEM 8 - Abnormal; Notable for the following:    Potassium 6.8 (*)    Glucose, Bld 122 (*)    Calcium, Ion 1.07 (*)    All other components within normal limits  CBC  DIFFERENTIAL  COMPREHENSIVE METABOLIC PANEL  TROPONIN I   Imaging Review Ct Head (brain) Wo Contrast  10/04/2012   CLINICAL DATA:  Left-sided weakness, slurred speech, facial droop.  EXAM: CT HEAD WITHOUT CONTRAST  TECHNIQUE: Contiguous axial images were obtained from the base of the skull through the vertex without intravenous contrast.  COMPARISON:  12/07/2007  FINDINGS: Hypodensity in the right basal ganglia and periventricular white matter, new since 2009. Cannot exclude acute to subacute infarct. No hemorrhage or hydrocephalus. No mass lesion or midline shift. No acute calvarial abnormality. Visualized paranasal sinuses and mastoids clear. Orbital soft tissues unremarkable.  IMPRESSION: New hypodense area in the right basal ganglia and periventricular white matter which could reflect acute to subacute infarct.   Electronically Signed   By: Charlett Nose   On: 10/04/2012 11:26    MDM  No diagnosis found. 1. CVA  CT head shows evidence of acute vs subacute stroke with hypodensity in the right basal ganlia. Discussed findings with patient and family. Plan is for neurology consult, MRI and  admission to medicine.    Arnoldo Hooker, PA-C 10/04/12 1211

## 2012-10-04 NOTE — Consult Note (Signed)
Referring Physician: Mcmanus    Chief Complaint: left sided weakness and slurred speech starting 4 days ago  HPI:                                                                                                                                         Tammy [name] is an 57 y.o. female with known HTN and recently found out that her sister has protein C deficiency.  Patient states over the last week she has not been taking her BP medications due to running out.  She had a MD appointment this week to get refills. On Friday she noted a onset of left facial droop, slurred speech and left arm tingling around 10:30.  These symptoms persisted over the weekend and into today.  Due to symptoms not resolving, patient came to ED. Currently she has a left facial droop, slurred speech when talking quickly, left proximal weakness.   Date last known well: Date: 09/30/2012 Time last known well: Unable to determine tPA Given: No: symptoms started 4 days ago  Past Medical History  Diagnosis Date  . Arthritis   . Depression   . GERD (gastroesophageal reflux disease)   . HTN (hypertension)   . Bipolar disorder     Past Surgical History  Procedure Laterality Date  . Cesarean section  1986    Family History  Problem Relation Age of Onset  . Breast cancer Mother   . Diabetes Brother   . Diabetes Sister   . Diabetes Paternal Aunt   . Breast cancer Maternal Aunt   . Breast cancer Paternal Aunt   . Colon cancer Paternal Uncle    Social History:  reports that she has been smoking Cigarettes.  She has a 18.5 pack-year smoking history. She has never used smokeless tobacco. She reports that  drinks alcohol. She reports that she does not use illicit drugs.  Allergies: No Known Allergies  Medications:                                                                                                                           No current facility-administered medications for this encounter.   Current  Outpatient Prescriptions  Medication Sig Dispense Refill  . atenolol (TENORMIN) 25 MG tablet Take 25 mg by mouth 2 (two) times daily.       . Ibuprofen-Diphenhydramine Cit (MOTRIN PM)  200-38 MG TABS Take 1 tablet by mouth at bedtime as needed (sleep/pain).      [name] lansoprazole (PREVACID) 15 MG capsule Take 15 mg by mouth once.      . pantoprazole (PROTONIX) 40 MG tablet Take 40 mg by mouth daily.         ROS:                                                                                                                                       History obtained from the patient  General ROS: negative for - chills, fatigue, fever, night sweats, weight gain or weight loss Psychological ROS: negative for - behavioral disorder, hallucinations, memory difficulties, mood swings or suicidal ideation Ophthalmic ROS: negative for - blurry vision, double vision, eye pain or loss of vision ENT ROS: negative for - epistaxis, nasal discharge, oral lesions, sore throat, tinnitus or vertigo Allergy and Immunology ROS: negative for - hives or itchy/watery eyes Hematological and Lymphatic ROS: negative for - bleeding problems, bruising or swollen lymph nodes Endocrine ROS: negative for - galactorrhea, hair pattern changes, polydipsia/polyuria or temperature intolerance Respiratory ROS: negative for - cough, hemoptysis, shortness of breath or wheezing Cardiovascular ROS: negative for - chest pain, dyspnea on exertion, edema or irregular heartbeat Gastrointestinal ROS: negative for - abdominal pain, diarrhea, hematemesis, nausea/vomiting or stool incontinence Genito-Urinary ROS: negative for - dysuria, hematuria, incontinence or urinary frequency/urgency Musculoskeletal ROS: negative for - joint swelling or muscular weakness Neurological ROS: as noted in HPI Dermatological ROS: negative for rash and skin lesion changes  Neurologic Examination:                                                                                                       Blood pressure 160/81, pulse 65, temperature 98 F (36.7 C), temperature source Oral, resp. rate 16, height 5\' 2"  (1.575 m), weight 74.39 kg (164 lb), SpO2 100.00%.  Mental Status: Alert, oriented, thought content appropriate.  Speech fluent without evidence of aphasia.  Able to follow 3 step commands without difficulty. Cranial Nerves: II: Discs flat bilaterally; Visual fields grossly normal, pupils equal, round, reactive to light and accommodation III,IV, VI: ptosis not present, extra-ocular motions intact bilaterally V,VII: smile asymmetric on the left, facial light touch sensation normal bilaterally VIII: hearing normal bilaterally IX,X: gag reflex present XI: bilateral shoulder shrug XII: midline tongue extension Motor: Right : Upper extremity   5/5    Left:     Upper extremity  4/5 (proximal)  Lower extremity   5/5     Lower extremity   4/5 (proximal) --positive orbiting right around left hand, no drift noted  Tone and bulk:normal tone throughout; no atrophy noted Sensory: slightly decreased on the left arm only Deep Tendon Reflexes:  Right: Upper Extremity   Left: Upper extremity   biceps (C-5 to C-6) 2/4   biceps (C-5 to C-6) 2/4 tricep (C7) 2/4    triceps (C7) 2/4 Brachioradialis (C6) 2/4  Brachioradialis (C6) 2/4  Lower Extremity Lower Extremity  quadriceps (L-2 to L-4) 2/4   quadriceps (L-2 to L-4) 2/4 Achilles (S1) 2/4   Achilles (S1) 2/4  Plantars: Right: downgoing   Left: downgoing Cerebellar: normal finger-to-nose,  normal heel-to-shin test CV: pulses palpable throughout    Results for orders placed during the hospital encounter of 10/04/12 (from the past 48 hour(s))  PROTIME-INR     Status: Abnormal   Collection Time    10/04/12 11:08 AM      Result Value Range   Prothrombin Time 16.3 (*) 11.6 - 15.2 seconds   INR 1.34  0.00 - 1.49  APTT     Status: Abnormal   Collection Time    10/04/12 11:08 AM      Result Value Range    aPTT 39 (*) 24 - 37 seconds   Comment:            IF BASELINE aPTT IS ELEVATED,     SUGGEST PATIENT RISK ASSESSMENT     BE USED TO DETERMINE APPROPRIATE     ANTICOAGULANT THERAPY.  CBC     Status: None   Collection Time    10/04/12 11:08 AM      Result Value Range   WBC 5.1  4.0 - 10.5 K/uL   RBC 4.38  3.87 - 5.11 MIL/uL   Hemoglobin 13.0  12.0 - 15.0 g/dL   HCT 98.1  19.1 - 47.8 %   MCV 90.2  78.0 - 100.0 fL   MCH 29.7  26.0 - 34.0 pg   MCHC 32.9  30.0 - 36.0 g/dL   RDW 29.5  62.1 - 30.8 %   Platelets 163  150 - 400 K/uL  DIFFERENTIAL     Status: None   Collection Time    10/04/12 11:08 AM      Result Value Range   Neutrophils Relative % 59  43 - 77 %   Neutro Abs 3.0  1.7 - 7.7 K/uL   Lymphocytes Relative 33  12 - 46 %   Lymphs Abs 1.7  0.7 - 4.0 K/uL   Monocytes Relative 6  3 - 12 %   Monocytes Absolute 0.3  0.1 - 1.0 K/uL   Eosinophils Relative 2  0 - 5 %   Eosinophils Absolute 0.1  0.0 - 0.7 K/uL   Basophils Relative 0  0 - 1 %   Basophils Absolute 0.0  0.0 - 0.1 K/uL  POCT I-STAT, CHEM 8     Status: Abnormal   Collection Time    10/04/12 11:16 AM      Result Value Range   Sodium 139  135 - 145 mEq/L   Potassium 6.8 (*) 3.5 - 5.1 mEq/L   Chloride 109  96 - 112 mEq/L   BUN 20  6 - 23 mg/dL   Creatinine, Ser 6.57  0.50 - 1.10 mg/dL   Glucose, Bld 846 (*) 70 - 99 mg/dL   Calcium, Ion 9.62 (*) 1.12 - 1.23 mmol/L  TCO2 24  0 - 100 mmol/L   Hemoglobin 14.3  12.0 - 15.0 g/dL   HCT 16.1  09.6 - 04.5 %   Comment NOTIFIED PHYSICIAN     Ct Head (brain) Wo Contrast  10/04/2012   CLINICAL DATA:  Left-sided weakness, slurred speech, facial droop.  EXAM: CT HEAD WITHOUT CONTRAST  TECHNIQUE: Contiguous axial images were obtained from the base of the skull through the vertex without intravenous contrast.  COMPARISON:  12/07/2007  FINDINGS: Hypodensity in the right basal ganglia and periventricular white matter, new since 2009. Cannot exclude acute to subacute infarct. No  hemorrhage or hydrocephalus. No mass lesion or midline shift. No acute calvarial abnormality. Visualized paranasal sinuses and mastoids clear. Orbital soft tissues unremarkable.  IMPRESSION: New hypodense area in the right basal ganglia and periventricular white matter which could reflect acute to subacute infarct.   Electronically Signed   By: Charlett Nose   On: 10/04/2012 11:26    Assessment: 57 y.o. female with 4 day history of slurred speech, left facial droop, left arm and leg weakness in the setting of hypertensive medication noncompliance.  In addition, patient also noted her sister has recently been diagnosed with protein C deficiency. CT head shows new hypodensity in the right basal ganglia and periventricular white matter consist ant with small vessel CVA.  Stroke Risk Factors - hypertension  Recommend: 1. HgbA1c, fasting lipid panel,  2. MRI, MRA  of the brain without contrast 3. Echocardiogram 4. Carotid dopplers 5. Prophylactic therapy-Antiplatelet med: Aspirin - dose 81 mg daily 6. Risk factor modification 7. Telemetry monitoring 8. Frequent neuro checks 9. PT/OT SLP 10. hyper coag panel (ordered)  Felicie Morn PA-C [facility] [phone]  10/04/2012, 1:03 PM  I personally participate in this patient's evaluation and management, including formulating the above clinical impression and management recommendations.  Venetia Maxon M.D. [facility] [phone]

## 2012-10-04 NOTE — ED Notes (Signed)
Spoke with Lab, need recollect of CMP and Troponin, report the bld was hemolyzed.

## 2012-10-04 NOTE — Progress Notes (Signed)
*  PRELIMINARY RESULTS* Echocardiogram 2D Echocardiogram has been performed.  Tammy [name] 10/04/2012, 5:04 PM

## 2012-10-04 NOTE — ED Notes (Signed)
Pt returned from radiology.

## 2012-10-05 ENCOUNTER — Inpatient Hospital Stay (HOSPITAL_COMMUNITY): Payer: Medicare Other

## 2012-10-05 DIAGNOSIS — R4789 Other speech disturbances: Secondary | ICD-10-CM

## 2012-10-05 DIAGNOSIS — F411 Generalized anxiety disorder: Secondary | ICD-10-CM | POA: Diagnosis present

## 2012-10-05 DIAGNOSIS — F319 Bipolar disorder, unspecified: Secondary | ICD-10-CM | POA: Diagnosis present

## 2012-10-05 LAB — PROTEIN S ACTIVITY: Protein S Activity: 69 % (ref 69–129)

## 2012-10-05 LAB — PROTHROMBIN GENE MUTATION

## 2012-10-05 LAB — LIPID PANEL: HDL: 49 mg/dL (ref >39)

## 2012-10-05 LAB — LUPUS ANTICOAGULANT PANEL: Lupus Anticoagulant: NOT DETECTED

## 2012-10-05 LAB — BETA-2-GLYCOPROTEIN I ABS, IGG/M/A
Beta-2 Glyco I IgG: 2 G Units (ref <20)
Beta-2-Glycoprotein I IgA: 0 A Units (ref <20)
Beta-2-Glycoprotein I IgM: 0 M Units (ref <20)

## 2012-10-05 LAB — CARDIOLIPIN ANTIBODIES, IGG, IGM, IGA: Anticardiolipin IgA: 15 APL U/mL (ref <22)

## 2012-10-05 LAB — PROTEIN C ACTIVITY: Protein C Activity: 69 % — ABNORMAL LOW (ref 75–133)

## 2012-10-05 LAB — PROTEIN C, TOTAL: Protein C, Total: 43 % — ABNORMAL LOW (ref 72–160)

## 2012-10-05 LAB — HEMOGLOBIN A1C
Hgb A1c MFr Bld: 5.6 % (ref <5.7)
Mean Plasma Glucose: 114 mg/dL (ref <117)

## 2012-10-05 MED ORDER — NICOTINE 7 MG/24HR TD PT24
7.0000 mg | MEDICATED_PATCH | Freq: Every day | TRANSDERMAL | Status: DC
  Administered 2012-10-05 – 2012-10-06 (×2): 7 mg via TRANSDERMAL
  Filled 2012-10-05 (×2): qty 1

## 2012-10-05 MED ORDER — CHLORDIAZEPOXIDE HCL 5 MG PO CAPS
5.0000 mg | ORAL_CAPSULE | Freq: Every day | ORAL | Status: DC
  Administered 2012-10-05 – 2012-10-06 (×2): 5 mg via ORAL
  Filled 2012-10-05 (×2): qty 1

## 2012-10-05 MED ORDER — ZOLPIDEM TARTRATE 5 MG PO TABS
5.0000 mg | ORAL_TABLET | Freq: Every evening | ORAL | Status: DC | PRN
  Administered 2012-10-05: 5 mg via ORAL
  Filled 2012-10-05: qty 1

## 2012-10-05 MED ORDER — TRAMADOL HCL 50 MG PO TABS
100.0000 mg | ORAL_TABLET | Freq: Two times a day (BID) | ORAL | Status: DC | PRN
  Administered 2012-10-06: 100 mg via ORAL
  Filled 2012-10-05: qty 2

## 2012-10-05 MED ORDER — DIPHENHYDRAMINE HCL 25 MG PO CAPS
25.0000 mg | ORAL_CAPSULE | Freq: Once | ORAL | Status: AC
  Administered 2012-10-05: 25 mg via ORAL
  Filled 2012-10-05: qty 1

## 2012-10-05 NOTE — Progress Notes (Signed)
VASCULAR LAB PRELIMINARY  PRELIMINARY  PRELIMINARY  PRELIMINARY  Carotid duplex completed.    Preliminary report:  Right - Normal no evidence of ICA stenosis. Vertebral artery flow is antegrade. Left -  1-39% ICA stenosis in the bulb.  Vertebral artery flow is antegrade.     Lilliauna Van, RVS 10/05/2012, 10:15 AM
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
## 2012-10-05 NOTE — Progress Notes (Signed)
SLP Cancellation Note  Patient Details Name: Tammy [name] MRN: [id number] DOB: Jan 25, 1956   Cancelled treatment:       Reason Eval/Treat Not Completed: Patient at procedure or test/unavailable. Pt at procedure. Will re attempt at next date.   Harlon Ditty, [place] CCC-SLP [phone]  [name] 10/05/2012, 9:54 AM

## 2012-10-05 NOTE — Progress Notes (Signed)
Stroke Team Progress Note  HISTORY Tammy [name] is an 58 y.o. female with known HTN and recently found out that her sister has protein C deficiency. Patient states over the last week she has not been taking her BP medications due to running out. She had a MD appointment this week to get refills. On Friday she noted a onset of left facial droop, slurred speech and left arm tingling around 10:30. These symptoms persisted over the weekend and into today. Due to symptoms not resolving, patient came to ED. Currently she has a left facial droop, slurred speech when talking quickly, left proximal weakness.  Patient was not a TPA candidate secondary to delay in arrival, symptoms started 4 days ago. She was admitted for further evaluation and treatment.  SUBJECTIVE Her daughter is at the bedside.  Overall she feels her condition is gradually improving. She reports fluctuating symptoms since Fri - working outside.  OBJECTIVE Most recent Vital Signs: Filed Vitals:   10/05/12 0046 10/05/12 0146 10/05/12 0434 10/05/12 0645  BP: 198/80 180/88 140/77 169/81  Pulse: 63  56 53  Temp: 98.6 F (37 C)  98.3 F (36.8 C) 98.2 F (36.8 C)  TempSrc: Oral  Oral Oral  Resp: 16  16 18   Height:      Weight:      SpO2: 100%  97% 100%   CBG (last 3)  No results found for this basename: GLUCAP,  in the last 72 hours  IV Fluid Intake:     MEDICATIONS  . aspirin  300 mg Rectal Daily   Or  . aspirin  325 mg Oral Daily  . atenolol  25 mg Oral BID  . heparin  5,000 Units Subcutaneous Q8H  . nicotine  7 mg Transdermal Daily  . pantoprazole  40 mg Oral Daily   PRN:  acetaminophen, acetaminophen, hydrALAZINE  Diet:  Cardiac thin liquids Activity:  OOB with assistance DVT Prophylaxis:  Heparin 5000 units sq tid   CLINICALLY SIGNIFICANT STUDIES Basic Metabolic Panel:   Recent Labs Lab 10/04/12 1116 10/04/12 1310  NA 139 141  K 6.8* 3.2*  CL 109 107  CO2  --  23  GLUCOSE 122* 108*  BUN 20 13   CREATININE 0.90 0.69  CALCIUM  --  9.3   Liver Function Tests:   Recent Labs Lab 10/04/12 1310  AST 152*  ALT 93*  ALKPHOS 109  BILITOT 0.7  PROT 8.0  ALBUMIN 3.2*   CBC:   Recent Labs Lab 10/04/12 1108 10/04/12 1116  WBC 5.1  --   NEUTROABS 3.0  --   HGB 13.0 14.3  HCT 39.5 42.0  MCV 90.2  --   PLT 163  --    Coagulation:   Recent Labs Lab 10/04/12 1108  LABPROT 16.3*  INR 1.34   Cardiac Enzymes:   Recent Labs Lab 10/04/12 1310  TROPONINI <0.30   Urinalysis: No results found for this basename: COLORURINE, APPERANCEUR, LABSPEC, PHURINE, GLUCOSEU, HGBUR, BILIRUBINUR, KETONESUR, PROTEINUR, UROBILINOGEN, NITRITE, LEUKOCYTESUR,  in the last 168 hours Lipid Panel    Component Value Date/Time   CHOL 134 10/05/2012 0630   TRIG 92 10/05/2012 0630   HDL 49 10/05/2012 0630   CHOLHDL 2.7 10/05/2012 0630   VLDL 18 10/05/2012 0630   LDLCALC 67 10/05/2012 0630   HgbA1C  No results found for this basename: HGBA1C    Urine Drug Screen:     Component Value Date/Time   LABOPIA NONE DETECTED 09/10/2006 2042   COCAINSCRNUR  NONE DETECTED 09/10/2006 2042   LABBENZ POSITIVE* 09/10/2006 2042   AMPHETMU NONE DETECTED 09/10/2006 2042   THCU NONE DETECTED 09/10/2006 2042   LABBARB  Value: NONE DETECTED        DRUG SCREEN FOR MEDICAL PURPOSES ONLY.  IF CONFIRMATION IS NEEDED FOR ANY PURPOSE, NOTIFY LAB WITHIN 5 DAYS. 09/10/2006 2042    Alcohol Level: No results found for this basename: ETH,  in the last 168 hours   CT of the brain  10/04/2012   New hypodense area in the right basal ganglia and periventricular white matter which could reflect acute to subacute infarct.    MRI of the brain  10/04/2012     1. Acute right corona radiata to lentiform nuclei lacunar infarct. No mass effect or hemorrhage.  2. Underlying signal changes suggestive of chronic white matter small vessel ischemia.   MRA of the brain  10/04/2012    Negative intracranial MRA.  2D Echocardiogram    Carotid Doppler  No  evidence of hemodynamically significant internal carotid artery stenosis. Vertebral artery flow is antegrade.   CXR  10/04/2012    No acute cardiopulmonary abnormality.  EKG  normal sinus rhythm.   Therapy Recommendations   Physical Exam   Pleasant middle aged african american lady not in distress.Awake alert. Afebrile. Head is nontraumatic. Neck is supple without bruit. Hearing is normal. Cardiac exam no murmur or gallop. Lungs are clear to auscultation. Distal pulses are well felt. Neurological exam ; Awake alert oriented x 3 normal speech and language. Mild left lower face asymmetry. Tongue midline. No drift. Mild diminished fine finger movements on left. Orbits right over left upper extremity. Mild left grip weak.. Normal sensation . Normal coordination. ASSESSMENT Ms. Tammy [name] is a 57 y.o. female presenting with left sided weakness and slurred speech that started 4 days prior to admission. Imaging confirms a right corona radiata to lentiform nuclei lacunar infarct. Infarct felt to be thrombotic secondary to small vessel disease.  On no antithrombotics prior to admission. Now on aspirin 325 mg orally every day for secondary stroke prevention. Patient with resultant fine left hemiparesis. Work up underway.  Hypertension Bipolar Cigarette smoker - states she has, now with patch etoh use LDL 67, HgbA1c pending No hx of VTE or miscarriages  Hospital day # 1  TREATMENT/PLAN  Continue aspirin 325 mg orally every day for secondary stroke prevention.  F/u HgbA1c, 2D results, hypercoagulable testing  Stop smoking  Therapy evals  Stay hydrated  Tammy Main, MSN, RN, ANVP-BC, ANP-BC, GNP-BC [facility] Pager: [phone] 10/05/2012 11:13 AM  I have personally obtained a history, examined the patient, evaluated imaging results, and formulated the assessment and plan of care. I agree with the above. Tammy Heady, MD

## 2012-10-05 NOTE — ED Provider Notes (Signed)
Medical screening examination/treatment/procedure(s) were conducted as a shared visit with non-physician practitioner(s) and myself.  I personally evaluated the patient during the encounter  57yo F, c/o constant left sided weakness and slurred speech x4 days. Endorses she has not been taking her BP medicines regularly. Family states pt's sister recent dx with protein C deficiency and renal infarct. A&O, NAD, CTA, RRR, abd soft/NT. +left facial droop, speech slightly slurred, no aphasia, 4/5 strength LUE and LLE. CT-H with acute/subacute infarct. Pt not code stroke nor TPA candidate due to symptom onset 4 days ago. Consult Neuro, [facility] to admit.   Laray Anger, DO 10/05/12 2256

## 2012-10-05 NOTE — Evaluation (Signed)
Physical Therapy Evaluation Patient Details Name: Tammy [name] MRN: [id number] DOB: Oct 17, 1955 Today's Date: 10/05/2012 Time: 7829-5621 PT Time Calculation (min): 24 min  PT Assessment / Plan / Recommendation History of Present Illness  Pt presents with mild left sided weakness and slurred speech, MRI revealed right CVA. Pt with h/o bipolar disorder.  Clinical Impression  Pt is mobilizing well with mild left sided weakness that is causing mild balance deficit, mostly with sit to stand. Recommend outpt PT for strengthening of the left side and PT will follow her acutely as well to strengthen and improve balance for d/c home independently.     PT Assessment  Patient needs continued PT services    Follow Up Recommendations  Outpatient PT    Does the patient have the potential to tolerate intense rehabilitation      Barriers to Discharge        Equipment Recommendations  None recommended by PT    Recommendations for Other Services     Frequency Min 4X/week    Precautions / Restrictions Precautions Precautions: Other (comment) (CVA sxs reviewed) Restrictions Weight Bearing Restrictions: No   Pertinent Vitals/Pain No c/o pain      Mobility  Bed Mobility Bed Mobility: Supine to Sit;Sitting - Scoot to Edge of Bed Supine to Sit: 7: Independent Sitting - Scoot to Edge of Bed: 7: Independent Transfers Transfers: Stand to Sit;Sit to Stand Sit to Stand: 5: Supervision;From bed;With upper extremity assist Stand to Sit: 6: Modified independent (Device/Increase time);To chair/3-in-1 Details for Transfer Assistance: pt with mild left lean with standing and pushing on left knee with hands for support. Mild balance deviation to left with sit to stand but self-corrected Ambulation/Gait Ambulation/Gait Assistance: 5: Supervision Ambulation Distance (Feet): 250 Feet Assistive device: None Ambulation/Gait Assistance Details: no LOB with ambulation, speed and gait pattern WFL Gait  Pattern: Within Functional Limits Gait velocity: WFL Stairs: No Wheelchair Mobility Wheelchair Mobility: No Modified Rankin (Stroke Patients Only) Pre-Morbid Rankin Score: No symptoms Modified Rankin: No significant disability    Exercises     PT Diagnosis: Hemiplegia non-dominant side  PT Problem List: Decreased strength;Decreased balance;Decreased mobility;Decreased knowledge of precautions PT Treatment Interventions: Gait training;Stair training;Functional mobility training;Therapeutic activities;Therapeutic exercise;Balance training;Neuromuscular re-education;Patient/family education     PT Goals(Current goals can be found in the care plan section) Acute Rehab PT Goals Patient Stated Goal: return home PT Goal Formulation: With patient Time For Goal Achievement: 10/19/12 Potential to Achieve Goals: Good Additional Goals Additional Goal #1: Pt to perform >19 on DGI  Visit Information  Last PT Received On: 10/05/12 Assistance Needed: +1 History of Present Illness: Pt presents with mild left sided weakness and slurred speech, MRI revealed right CVA. Pt with h/o bipolar disorder.       Prior Functioning  Home Living Family/patient expects to be discharged to:: Private residence Living Arrangements: Children Available Help at Discharge: Family;Available PRN/intermittently Type of Home: Apartment Home Access: Stairs to enter Entrance Stairs-Number of Steps: flight Entrance Stairs-Rails: Right;Left Home Layout: One level Home Equipment: None Additional Comments: pt lives with daughter who can take her to appts. She did not drive PTA and is on disability for bipolar d.o. Has a tub/ shower and lives in 2nd level condo Prior Function Level of Independence: Independent Communication Communication: Expressive difficulties (mild slurring) Dominant Hand: Right    Cognition  Cognition Arousal/Alertness: Awake/alert Behavior During Therapy: WFL for tasks  assessed/performed Overall Cognitive Status: History of cognitive impairments - at baseline (due to bipolar)  Extremity/Trunk Assessment Upper Extremity Assessment Upper Extremity Assessment: Defer to OT evaluation;LUE deficits/detail LUE Deficits / Details: noted posturing of left arm during ambulation LUE Coordination: decreased fine motor Lower Extremity Assessment Lower Extremity Assessment: LLE deficits/detail LLE Deficits / Details: knee ext 4/5, knee flex 4+/5, hip flex 4/5. Noted weakness with sit to stand with increased effort required with left hip and knee extension, beginning to compensate by pushing on left knee with hands Cervical / Trunk Assessment Cervical / Trunk Assessment: Normal   Balance Balance Balance Assessed: Yes Standardized Balance Assessment Standardized Balance Assessment: Dynamic Gait Index Dynamic Gait Index Level Surface: Normal Gait with Horizontal Head Turns: Normal Gait with Vertical Head Turns: Normal Gait and Pivot Turn: Normal High Level Balance High Level Balance Comments: pt has difficulty maintaining balance in unilateral stance bilaterally, requires min A to achieve and maintain  End of Session PT - End of Session Equipment Utilized During Treatment: Gait belt Activity Tolerance: Patient tolerated treatment well Patient left: in chair;Other (comment) (w/c to go to test) Nurse Communication: Mobility status  GP    Tammy [name], PT  Acute Rehab Services  [phone]  Tammy [name] 10/05/2012, 11:20 AM

## 2012-10-05 NOTE — Evaluation (Signed)
Occupational Therapy Evaluation Patient Details Name: Tammy [name] MRN: [id number] DOB: 1955/08/29 Today's Date: 10/05/2012 Time: 4098-1191 OT Time Calculation (min): 34 min  OT Assessment / Plan / Recommendation History of present illness Pt presents with mild left sided weakness and slurred speech, MRI revealed right CVA. Pt with h/o bipolar disorder.   Clinical Impression   Pt presents to OT with weakness and coordination deficits of Lt UE and hand as well as impaired convergence making reading difficult without her reading glasses (different than baseline).  Pt provided with HEP for both Lt. UE and vision.  Anticipate she will be able to improve without formal OP OT; however, instructed pt to discuss with MD at follow up if deficits have not resolved as a referral to OP OT would then be appropriate.     OT Assessment  Patient needs continued OT Services    Follow Up Recommendations  No OT follow up;Supervision - Intermittent    Barriers to Discharge      Equipment Recommendations  None recommended by OT    Recommendations for Other Services    Frequency  Min 2X/week    Precautions / Restrictions Restrictions Weight Bearing Restrictions: No   Pertinent Vitals/Pain     ADL  Eating/Feeding: Modified independent Where Assessed - Eating/Feeding: Chair;Edge of bed Grooming: Wash/dry hands;Wash/dry face;Teeth care;Denture care;Brushing hair;Set up Where Assessed - Grooming: Unsupported standing Upper Body Bathing: Set up Where Assessed - Upper Body Bathing: Supported sitting;Unsupported sitting Lower Body Bathing: Supervision/safety Where Assessed - Lower Body Bathing: Supported sit to stand;Unsupported sit to stand Upper Body Dressing: Minimal assistance (assist for fasteners) Where Assessed - Upper Body Dressing: Unsupported sitting Lower Body Dressing: Minimal assistance (to tie shoes) Where Assessed - Lower Body Dressing: Supported sit to stand Toilet Transfer:  Supervision/safety Statistician Method: Sit to stand;Stand pivot Acupuncturist: Comfort height toilet Toileting - Clothing Manipulation and Hygiene: Minimal assistance (for fasteners) Where Assessed - Engineer, mining and Hygiene: Standing Tub/Shower Transfer: Therapist, sports Method: Science writer: Walk in shower Transfers/Ambulation Related to ADLs: supervision ADL Comments: Pt requires assist with fasteners and braiding hair due to incordination Lt. UE.  Pt instructed in HEP for Lt UE and vision and able to return demonstration     OT Diagnosis: Generalized weakness;Disturbance of vision  OT Problem List: Decreased strength;Decreased range of motion;Decreased coordination;Impaired UE functional use;Impaired vision/perception OT Treatment Interventions: Self-care/ADL training;DME and/or AE instruction;Therapeutic activities;Visual/perceptual remediation/compensation;Patient/family education;Therapeutic exercise   OT Goals(Current goals can be found in the care plan section) Acute Rehab OT Goals Patient Stated Goal: return home OT Goal Formulation: With patient Time For Goal Achievement: 10/12/12 Potential to Achieve Goals: Good  Visit Information  Last OT Received On: 10/05/12 Assistance Needed: +1 History of Present Illness: Pt presents with mild left sided weakness and slurred speech, MRI revealed right CVA. Pt with h/o bipolar disorder.       Prior Functioning     Home Living Family/patient expects to be discharged to:: Private residence Living Arrangements: Children Available Help at Discharge: Family;Available PRN/intermittently Type of Home: Apartment Home Access: Stairs to enter Entrance Stairs-Number of Steps: flight Entrance Stairs-Rails: Right;Left Home Layout: One level Home Equipment: None Additional Comments: pt lives with daughter who can take her to appts. She did not drive PTA  and is on disability for bipolar d.o. Lives in 2nd level condo Prior Function Level of Independence: Independent Communication Communication:  (mild slurring) Dominant Hand: Right  Vision/Perception Vision - History Baseline Vision: Wears glasses only for reading Patient Visual Report: Blurring of vision Vision - Assessment Eye Alignment: Within Functional Limits Vision Assessment: Vision tested Ocular Range of Motion: Within Functional Limits Tracking/Visual Pursuits: Able to track stimulus in all quads without difficulty Convergence: Impaired (comment) (~16" from nose) Visual Fields: No apparent deficits Additional Comments: Pt instructed in HEP to improve convergence and demonstrated understanding  Perception Perception: Within Functional Limits Praxis Praxis: Intact   Cognition  Cognition Arousal/Alertness: Awake/alert Behavior During Therapy: WFL for tasks assessed/performed Overall Cognitive Status: History of cognitive impairments - at baseline (due to bipolar)    Extremity/Trunk Assessment Upper Extremity Assessment Upper Extremity Assessment: LUE deficits/detail LUE Deficits / Details: Brunnstrom stage 5;  Strength grossly 4-/5 throoughout.   LUE Coordination: decreased fine motor;decreased gross motor Lower Extremity Assessment Lower Extremity Assessment: Defer to PT evaluation Cervical / Trunk Assessment Cervical / Trunk Assessment: Normal     Mobility Bed Mobility Bed Mobility: Supine to Sit;Sitting - Scoot to Edge of Bed;Sit to Supine Supine to Sit: 7: Independent Sitting - Scoot to Edge of Bed: 7: Independent Sit to Supine: 7: Independent Transfers Transfers: Sit to Stand;Stand to Sit Sit to Stand: 5: Supervision;With upper extremity assist;From bed Stand to Sit: 5: Supervision;With upper extremity assist;To bed     Exercise     Balance     End of Session OT - End of Session Activity Tolerance: Patient tolerated treatment well Patient  left: in bed;with call bell/phone within reach;with family/visitor present  GO     Tammy [name], Tammy [name] 10/05/2012, 2:00 PM

## 2012-10-06 ENCOUNTER — Other Ambulatory Visit: Payer: Self-pay | Admitting: *Deleted

## 2012-10-06 DIAGNOSIS — R4789 Other speech disturbances: Secondary | ICD-10-CM

## 2012-10-06 DIAGNOSIS — I6350 Cerebral infarction due to unspecified occlusion or stenosis of unspecified cerebral artery: Secondary | ICD-10-CM

## 2012-10-06 DIAGNOSIS — M6281 Muscle weakness (generalized): Secondary | ICD-10-CM

## 2012-10-06 DIAGNOSIS — G894 Chronic pain syndrome: Secondary | ICD-10-CM | POA: Diagnosis present

## 2012-10-06 DIAGNOSIS — F17200 Nicotine dependence, unspecified, uncomplicated: Secondary | ICD-10-CM | POA: Diagnosis present

## 2012-10-06 LAB — POCT I-STAT, CHEM 8
Calcium, Ion: 1.07 mmol/L — ABNORMAL LOW (ref 1.12–1.23)
Creatinine, Ser: 0.9 mg/dL (ref 0.50–1.10)
Hemoglobin: 14.3 g/dL (ref 12.0–15.0)
Sodium: 139 mEq/L (ref 135–145)
TCO2: 24 mmol/L (ref 0–100)

## 2012-10-06 MED ORDER — NICOTINE 7 MG/24HR TD PT24: 1.0000 | MEDICATED_PATCH | Freq: Every day | TRANSDERMAL | Status: DC

## 2012-10-06 MED ORDER — ASPIRIN 325 MG PO TABS: 325.0000 mg | ORAL_TABLET | Freq: Every day | ORAL | Status: DC

## 2012-10-06 MED ORDER — IBUPROFEN-DIPHENHYDRAMINE CIT 200-38 MG PO TABS: 1.0000 | ORAL_TABLET | Freq: Every evening | ORAL | Status: DC | PRN

## 2012-10-06 MED ORDER — PANTOPRAZOLE SODIUM 40 MG PO TBEC: 40.0000 mg | DELAYED_RELEASE_TABLET | Freq: Every day | ORAL | Status: DC

## 2012-10-06 MED ORDER — ATENOLOL 25 MG PO TABS: 25.0000 mg | ORAL_TABLET | Freq: Two times a day (BID) | ORAL | Status: DC

## 2012-10-06 MED ORDER — TRAMADOL HCL 50 MG PO TABS: 100.0000 mg | ORAL_TABLET | Freq: Two times a day (BID) | ORAL | Status: DC | PRN

## 2012-10-06 MED ORDER — CHLORDIAZEPOXIDE HCL 5 MG PO CAPS: 5.0000 mg | ORAL_CAPSULE | Freq: Every day | ORAL | Status: DC

## 2012-10-06 NOTE — Progress Notes (Signed)
Angie from [facility] calling about needing orders for PT and ST for this pt.  DONE.

## 2012-10-06 NOTE — Progress Notes (Signed)
AVS and D/C instruction given with medication script stroke education reinforcedsmoking cessation education also given pt demostrate good understanding

## 2012-10-06 NOTE — Care Management Note (Unsigned)
    Page 1 of 1   10/06/2012     11:40:30 AM   CARE MANAGEMENT NOTE 10/06/2012  Patient:  Tammy [name], Tammy [name]   Account Number:  [id number]  Date Initiated:  10/06/2012  Documentation initiated by:  Elmer Bales  Subjective/Objective Assessment:   patient admitted for CVA     Action/Plan:   will follow for discharge needs   Anticipated DC Date:  10/06/2012   Anticipated DC Plan:  HOME/SELF CARE      DC Planning Services  CM consult  OP Neuro Rehab      Choice offered to / List presented to:             Status of service:  Completed, signed off Medicare Important Message given?   (If response is "NO", the following Medicare IM given date fields will be blank) Date Medicare IM given:   Date Additional Medicare IM given:    Discharge Disposition:  HOME/SELF CARE  Per UR Regulation:    If discussed at Long Length of Stay Meetings, dates discussed:    Comments:  10/06/12 1045 Elmer Bales RN, MSN, CM- Met with patient and family to discuss outpatient Neuro Rehab.  Pt is agreeable to rehab and information was faxed out.

## 2012-10-06 NOTE — Progress Notes (Signed)
Stroke Team Progress Note  HISTORY Tammy [name] is an 57 y.o. female with known HTN and recently found out that her sister has protein C deficiency. Patient states over the last week she has not been taking her BP medications due to running out. She had a MD appointment this week to get refills. On Friday she noted a onset of left facial droop, slurred speech and left arm tingling around 10:30. These symptoms persisted over the weekend and into today. Due to symptoms not resolving, patient came to ED. Currently she has a left facial droop, slurred speech when talking quickly, left proximal weakness.  Patient was not a TPA candidate secondary to delay in arrival, symptoms started 4 days ago. She was admitted for further evaluation and treatment.  SUBJECTIVE Her daughter is at the bedside. Pt is getting ready to go home.  OBJECTIVE Most recent Vital Signs: Filed Vitals:   10/05/12 1835 10/05/12 2100 10/06/12 0200 10/06/12 0500  BP: 159/82 134/73 155/67 175/81  Pulse: 52 55 70 44  Temp: 98.2 F (36.8 C) 98.4 F (36.9 C) 98 F (36.7 C) 98 F (36.7 C)  TempSrc: Oral Oral Oral Oral  Resp: 16 20 20 20   Height:      Weight:      SpO2: 99% 100% 97% 98%   CBG (last 3)  No results found for this basename: GLUCAP,  in the last 72 hours  IV Fluid Intake:     MEDICATIONS  . aspirin  300 mg Rectal Daily   Or  . aspirin  325 mg Oral Daily  . atenolol  25 mg Oral BID  . chlordiazePOXIDE  5 mg Oral Daily  . heparin  5,000 Units Subcutaneous Q8H  . nicotine  7 mg Transdermal Daily  . pantoprazole  40 mg Oral Daily   PRN:  acetaminophen, acetaminophen, hydrALAZINE, traMADol, zolpidem  Diet:  Cardiac thin liquids Activity:  OOB with assistance DVT Prophylaxis:  Heparin 5000 units sq tid   CLINICALLY SIGNIFICANT STUDIES Basic Metabolic Panel:   Recent Labs Lab 10/04/12 1116 10/04/12 1310  NA 139 141  K 6.8* 3.2*  CL 109 107  CO2  --  23  GLUCOSE 122* 108*  BUN 20 13   CREATININE 0.90 0.69  CALCIUM  --  9.3   Liver Function Tests:   Recent Labs Lab 10/04/12 1310  AST 152*  ALT 93*  ALKPHOS 109  BILITOT 0.7  PROT 8.0  ALBUMIN 3.2*   CBC:   Recent Labs Lab 10/04/12 1108 10/04/12 1116  WBC 5.1  --   NEUTROABS 3.0  --   HGB 13.0 14.3  HCT 39.5 42.0  MCV 90.2  --   PLT 163  --    Coagulation:   Recent Labs Lab 10/04/12 1108  LABPROT 16.3*  INR 1.34   Cardiac Enzymes:   Recent Labs Lab 10/04/12 1310  TROPONINI <0.30   Urinalysis: No results found for this basename: COLORURINE, APPERANCEUR, LABSPEC, PHURINE, GLUCOSEU, HGBUR, BILIRUBINUR, KETONESUR, PROTEINUR, UROBILINOGEN, NITRITE, LEUKOCYTESUR,  in the last 168 hours Lipid Panel    Component Value Date/Time   CHOL 134 10/05/2012 0630   TRIG 92 10/05/2012 0630   HDL 49 10/05/2012 0630   CHOLHDL 2.7 10/05/2012 0630   VLDL 18 10/05/2012 0630   LDLCALC 67 10/05/2012 0630   HgbA1C  Lab Results  Component Value Date   HGBA1C 5.6 10/05/2012    Urine Drug Screen:     Component Value Date/Time   LABOPIA NONE  DETECTED 09/10/2006 2042   COCAINSCRNUR NONE DETECTED 09/10/2006 2042   LABBENZ POSITIVE* 09/10/2006 2042   AMPHETMU NONE DETECTED 09/10/2006 2042   THCU NONE DETECTED 09/10/2006 2042   LABBARB  Value: NONE DETECTED        DRUG SCREEN FOR MEDICAL PURPOSES ONLY.  IF CONFIRMATION IS NEEDED FOR ANY PURPOSE, NOTIFY LAB WITHIN 5 DAYS. 09/10/2006 2042    Alcohol Level: No results found for this basename: ETH,  in the last 168 hours  Hypercoagulable Workup Normal - Anti III, homocysteine, lupus anticoagulant, cardiolipin antibody Pending - factor V, Protein S activity, Protein S total,  Abnormal - Prot C activity 69 (low), Protein C total 43 (low)   CT of the brain  10/04/2012   New hypodense area in the right basal ganglia and periventricular white matter which could reflect acute to subacute infarct.    MRI of the brain  10/04/2012     1. Acute right corona radiata to lentiform nuclei  lacunar infarct. No mass effect or hemorrhage.  2. Underlying signal changes suggestive of chronic white matter small vessel ischemia.   MRA of the brain  10/04/2012    Negative intracranial MRA.  2D Echocardiogram  EF 55-60% with no source of embolus.   Carotid Doppler  No evidence of hemodynamically significant internal carotid artery stenosis. Vertebral artery flow is antegrade.   CXR  10/04/2012    No acute cardiopulmonary abnormality.  EKG  normal sinus rhythm.   Therapy Recommendations no OT, OP PT  Physical Exam   Pleasant middle aged african american lady not in distress.Awake alert. Afebrile. Head is nontraumatic. Neck is supple without bruit. Hearing is normal. Cardiac exam no murmur or gallop. Lungs are clear to auscultation. Distal pulses are well felt. Neurological exam ; Awake alert oriented x 3 normal speech and language. Mild left lower face asymmetry. Tongue midline. No drift. Mild diminished fine finger movements on left. Orbits right over left upper extremity. Mild left grip weak.. Normal sensation . Normal coordination.  ASSESSMENT Ms. Tammy [name] is a 56 y.o. female presenting with left sided weakness and slurred speech that started 4 days prior to admission. Imaging confirms a right corona radiata to lentiform nuclei lacunar infarct. Infarct felt to be thrombotic secondary to small vessel disease.  On no antithrombotics prior to admission. Now on aspirin 325 mg orally every day for secondary stroke prevention. Patient with resultant fine left hemiparesis with OP PT recommended. Work up completed.  Hypertension Bipolar Cigarette smoker - states she has, now with patch etoh use LDL 67, HgbA1c 5.6 No hx of VTE or miscarriages Low protein C, not associated with current stroke, not low enough to consider treatment  Hospital day # 2  TREATMENT/PLAN  Continue aspirin 325 mg orally every day for secondary stroke prevention.  Stop smoking  OP PT (ordered)  Stay  hydrated No further stroke workup indicated. Patient has a 10-15% risk of having another stroke over the next year, the highest risk is within 2 weeks of the most recent stroke/TIA (risk of having a stroke following a stroke or TIA is the same). Ongoing risk factor control by Primary Care Physician Stroke Service will sign off. Please call should any needs arise. Follow up with Dr. Pearlean Brownie, Stroke Clinic, in 2 months.   Annie Main, MSN, RN, ANVP-BC, ANP-BC, [facility] Pager: [phone] 10/06/2012 10:26 AM  I have personally obtained a history, examined the patient, evaluated imaging results, and formulated the assessment and plan  of care. I agree with the above.  Antony Contras, MD

## 2012-10-06 NOTE — Evaluation (Signed)
Speech Language Pathology Evaluation Patient Details Name: Matthew Pais MRN: [id number] DOB: 09-21-1955 Today's Date: 10/06/2012 Time: 1000-1013 SLP Time Calculation (min): 13 min  Problem List:  Patient Active Problem List   Diagnosis Date Noted  . Chronic pain syndrome 10/06/2012  . Anxiety state, unspecified 10/05/2012  . Bipolar disorder, unspecified 10/05/2012  . Slurred speech 10/04/2012  . Left-sided weakness 10/04/2012  . Hyperkalemia 10/04/2012  . Acute CVA (cerebrovascular accident) 10/04/2012  . HTN (hypertension)    Past Medical History:  Past Medical History  Diagnosis Date  . Arthritis   . Depression   . GERD (gastroesophageal reflux disease)   . HTN (hypertension)   . Bipolar disorder   . Stroke 09/2012    slurred speech and left sided weakness/notes 10/04/2012   Past Surgical History:  Past Surgical History  Procedure Laterality Date  . Cesarean section  1986   HPI:  Ms. Denys Labree is a 57 y.o. female presenting with left sided weakness and slurred speech that started 4 days prior to admission. Imaging confirms a right corona radiata to lentiform nuclei lacunar infarct. Infarct felt to be thrombotic secondary to small vessel disease.  On no antithrombotics prior to admission. Now on aspirin 325 mg orally every day for secondary stroke prevention. Patient with resultant fine left hemiparesis. Work up underway.   Assessment / Plan / Recommendation Clinical Impression  Pt demonstrates a mild dysarthria due to left labial and lingual weakness. She also reports mild drooping and pocketing of food on the left. Her cognition and language are WNL. SLP provided excercises and strategies to faciliate oral strength and improved intelligibility. Pt verbalized understanding. Outpatient SLP recommended for f/u; no further acute needs.     SLP Assessment  All further Speech Lanaguage Pathology  needs can be addressed in the next venue of care    Follow Up  Recommendations  Outpatient SLP    Frequency and Duration        Pertinent Vitals/Pain NA   SLP Goals     SLP Evaluation Prior Functioning  Cognitive/Linguistic Baseline: Within functional limits Type of Home: Apartment  Lives With: Daughter Available Help at Discharge: Family;Available PRN/intermittently Vocation: On disability   Cognition  Overall Cognitive Status: History of cognitive impairments - at baseline Arousal/Alertness: Awake/alert Orientation Level: Oriented X4 Attention: Alternating;Divided Alternating Attention: Appears intact Divided Attention: Appears intact Memory: Appears intact Awareness: Appears intact Problem Solving: Appears intact Safety/Judgment: Appears intact    Comprehension  Auditory Comprehension Overall Auditory Comprehension: Appears within functional limits for tasks assessed    Expression Verbal Expression Overall Verbal Expression: Appears within functional limits for tasks assessed   Oral / Motor Oral Motor/Sensory Function Overall Oral Motor/Sensory Function: Impaired Labial ROM: Reduced left Labial Symmetry: Abnormal symmetry left Labial Strength: Reduced Labial Sensation: Reduced Lingual ROM: Within Functional Limits Lingual Symmetry: Abnormal symmetry left Lingual Strength: Reduced Lingual Sensation: Reduced Facial ROM: Reduced left Facial Symmetry: Left droop Facial Strength: Reduced Facial Sensation: Reduced Velum: Within Functional Limits Mandible: Within Functional Limits Motor Speech Overall Motor Speech: Impaired Respiration: Within functional limits Phonation: Normal Resonance: Within functional limits Articulation: Impaired Level of Impairment: Conversation Intelligibility: Intelligible Motor Planning: Witnin functional limits Motor Speech Errors: Consistent;Aware Effective Techniques: Slow rate;Increased vocal intensity;Over-articulate;Pause   GO    Harlon Ditty, MA CCC-SLP [phone]  [name] 10/06/2012, 10:23 AM

## 2012-10-06 NOTE — Progress Notes (Signed)
Pt discharged home for out patient  PT  accompanied by daughter . IV SL removed tele D?C

## 2012-10-06 NOTE — Progress Notes (Signed)
Occupational Therapy Treatment Patient Details Name: Tammy [name] MRN: [id number] DOB: [birth year] Today's Date: 10/06/2012 Time: 4098-1191 OT Time Calculation (min): 31 min  OT Assessment / Plan / Recommendation  History of present illness Pt presents with mild left sided weakness and slurred speech, MRI revealed right CVA. Pt with h/o bipolar disorder.   OT comments  Pt with improving Lt. UE strength.  Pt reports hand feels tighter with decreased FMC - may be due to fatigue.  Pt is independent with HEP, and is able to perform BADLs at supervision to modified independent level   Follow Up Recommendations  No OT follow up;Supervision - Intermittent    Barriers to Discharge       Equipment Recommendations  None recommended by OT    Recommendations for Other Services    Frequency Min 2X/week   Progress towards OT Goals Progress towards OT goals: Progressing toward goals  Plan Discharge plan remains appropriate    Precautions / Restrictions     Pertinent Vitals/Pain     ADL  Grooming: Modified independent Where Assessed - Grooming: Supported standing;Supported sitting Lower Body Bathing: Set up Where Assessed - Lower Body Bathing: Supported sit to stand Upper Body Dressing: Set up Where Assessed - Upper Body Dressing: Supported sit to stand Lower Body Dressing: Set up Where Assessed - Lower Body Dressing: Unsupported sitting Toilet Transfer: Modified independent Toilet Transfer Method: Sit to stand;Stand pivot Acupuncturist: Comfort height toilet Toileting - Clothing Manipulation and Hygiene: Supervision/safety Where Assessed - Toileting Clothing Manipulation and Hygiene: Standing ADL Comments: Pt reports lt hand is tighter than it was yesterday and she is concerned.  Lt. UE now with movement in brunnstrom stage VI with strength grossly 4/5.  Pt reports she performed exercises all day yesterday and into night and confirms she is fatigued.  Encouragement  provided.  She is able to perform BADLs at supervision to modified independent level.  Pt reports difficulty pulling pants over Lt hip.  Instructed her in strategies to improve Lt UE function and she was able to return demonstration    OT Diagnosis:    OT Problem List:   OT Treatment Interventions:     OT Goals(current goals can now be found in the care plan section)    Visit Information  Last OT Received On: 10/06/12 Assistance Needed: +1 History of Present Illness: Pt presents with mild left sided weakness and slurred speech, MRI revealed right CVA. Pt with h/o bipolar disorder.    Subjective Data      Prior Functioning  Home Living Available Help at Discharge: Family;Available PRN/intermittently Type of Home: Apartment  Lives With: Daughter    Cognition  Cognition Arousal/Alertness: Awake/alert Behavior During Therapy: WFL for tasks assessed/performed Overall Cognitive Status: History of cognitive impairments - at baseline    Mobility  Bed Mobility Bed Mobility: Not assessed Transfers Transfers: Sit to Stand;Stand to Sit Sit to Stand: 6: Modified independent (Device/Increase time) Stand to Sit: 6: Modified independent (Device/Increase time)    Exercises      Balance     End of Session OT - End of Session Activity Tolerance: Patient tolerated treatment well Patient left: in chair;with nursing/sitter in room  GO     Yamilette Garretson M 10/06/2012, 11:49 AM
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
## 2012-10-06 NOTE — Progress Notes (Signed)
PT Cancellation Note  Patient Details Name: Tammy [name] MRN: [id number] DOB: [birth year]   Cancelled Treatment:     Pt d/cing today.  OT states pt moving well & does not need PT prior to d/c home today.      Verdell Face, [place] [phone] 10/06/2012

## 2012-10-12 ENCOUNTER — Ambulatory Visit: Payer: Medicare Other

## 2012-10-12 ENCOUNTER — Ambulatory Visit: Payer: Medicare Other | Attending: Neurology | Admitting: Physical Therapy

## 2012-10-12 DIAGNOSIS — R5381 Other malaise: Secondary | ICD-10-CM | POA: Insufficient documentation

## 2012-10-12 DIAGNOSIS — R1311 Dysphagia, oral phase: Secondary | ICD-10-CM | POA: Insufficient documentation

## 2012-10-12 DIAGNOSIS — R269 Unspecified abnormalities of gait and mobility: Secondary | ICD-10-CM | POA: Insufficient documentation

## 2012-10-12 DIAGNOSIS — M6281 Muscle weakness (generalized): Secondary | ICD-10-CM | POA: Insufficient documentation

## 2012-10-12 DIAGNOSIS — R471 Dysarthria and anarthria: Secondary | ICD-10-CM | POA: Insufficient documentation

## 2012-10-12 DIAGNOSIS — R41842 Visuospatial deficit: Secondary | ICD-10-CM | POA: Insufficient documentation

## 2012-10-12 DIAGNOSIS — Z5189 Encounter for other specified aftercare: Secondary | ICD-10-CM | POA: Insufficient documentation

## 2012-10-12 DIAGNOSIS — R279 Unspecified lack of coordination: Secondary | ICD-10-CM | POA: Insufficient documentation

## 2012-10-13 ENCOUNTER — Ambulatory Visit: Payer: Medicare Other

## 2012-10-13 ENCOUNTER — Ambulatory Visit: Payer: Medicare Other | Admitting: Physical Therapy

## 2012-10-18 ENCOUNTER — Ambulatory Visit: Payer: Medicare Other

## 2012-10-18 ENCOUNTER — Ambulatory Visit: Payer: Medicare Other | Admitting: Occupational Therapy

## 2012-10-18 ENCOUNTER — Ambulatory Visit: Payer: Medicare Other | Admitting: Physical Therapy

## 2012-10-19 ENCOUNTER — Ambulatory Visit: Payer: Medicare Other | Admitting: Speech Pathology

## 2012-10-19 ENCOUNTER — Ambulatory Visit: Payer: Medicare Other | Admitting: Occupational Therapy

## 2012-10-19 ENCOUNTER — Ambulatory Visit: Payer: Medicare Other | Admitting: Physical Therapy

## 2012-10-24 ENCOUNTER — Encounter: Payer: Medicare Other | Admitting: Occupational Therapy

## 2012-10-25 ENCOUNTER — Ambulatory Visit: Payer: Medicare Other | Admitting: Physical Therapy

## 2012-10-25 ENCOUNTER — Ambulatory Visit: Payer: Medicare Other | Admitting: Occupational Therapy

## 2012-10-27 ENCOUNTER — Ambulatory Visit: Payer: Medicare Other | Admitting: Physical Therapy

## 2012-10-27 ENCOUNTER — Ambulatory Visit: Payer: Medicare Other | Admitting: Occupational Therapy

## 2012-10-27 ENCOUNTER — Ambulatory Visit: Payer: Medicare Other | Admitting: Speech Pathology

## 2012-11-01 ENCOUNTER — Ambulatory Visit: Payer: Medicare Other | Admitting: Physical Therapy

## 2012-11-01 ENCOUNTER — Ambulatory Visit: Payer: Medicare Other

## 2012-11-01 ENCOUNTER — Ambulatory Visit: Payer: Medicare Other | Admitting: Occupational Therapy

## 2012-11-03 ENCOUNTER — Ambulatory Visit: Payer: Medicare Other | Admitting: Rehabilitative and Restorative Service Providers"

## 2012-11-03 ENCOUNTER — Ambulatory Visit: Payer: Medicare Other | Admitting: Occupational Therapy

## 2012-11-07 ENCOUNTER — Ambulatory Visit: Payer: Medicare Other

## 2012-11-07 ENCOUNTER — Ambulatory Visit: Payer: Medicare Other | Admitting: Physical Therapy

## 2012-11-07 ENCOUNTER — Encounter: Payer: Medicare Other | Admitting: Occupational Therapy

## 2012-11-09 ENCOUNTER — Ambulatory Visit: Payer: Medicare Other | Attending: Neurology | Admitting: Occupational Therapy

## 2012-11-09 ENCOUNTER — Ambulatory Visit: Payer: Medicare Other | Admitting: Physical Therapy

## 2012-11-09 DIAGNOSIS — M6281 Muscle weakness (generalized): Secondary | ICD-10-CM | POA: Insufficient documentation

## 2012-11-09 DIAGNOSIS — R279 Unspecified lack of coordination: Secondary | ICD-10-CM | POA: Insufficient documentation

## 2012-11-09 DIAGNOSIS — R269 Unspecified abnormalities of gait and mobility: Secondary | ICD-10-CM | POA: Insufficient documentation

## 2012-11-09 DIAGNOSIS — R41842 Visuospatial deficit: Secondary | ICD-10-CM | POA: Insufficient documentation

## 2012-11-09 DIAGNOSIS — R1311 Dysphagia, oral phase: Secondary | ICD-10-CM | POA: Insufficient documentation

## 2012-11-09 DIAGNOSIS — R471 Dysarthria and anarthria: Secondary | ICD-10-CM | POA: Insufficient documentation

## 2012-11-09 DIAGNOSIS — R5381 Other malaise: Secondary | ICD-10-CM | POA: Insufficient documentation

## 2012-11-09 DIAGNOSIS — Z5189 Encounter for other specified aftercare: Secondary | ICD-10-CM | POA: Insufficient documentation

## 2012-11-15 ENCOUNTER — Ambulatory Visit: Payer: Medicare Other | Admitting: Physical Therapy

## 2012-11-15 ENCOUNTER — Ambulatory Visit: Payer: Medicare Other | Admitting: Occupational Therapy

## 2012-11-17 ENCOUNTER — Ambulatory Visit: Payer: Medicare Other | Admitting: Occupational Therapy

## 2012-11-17 ENCOUNTER — Ambulatory Visit: Payer: Medicare Other | Admitting: Physical Therapy

## 2012-11-22 ENCOUNTER — Ambulatory Visit: Payer: Medicare Other | Admitting: Physical Therapy

## 2012-11-22 ENCOUNTER — Ambulatory Visit: Payer: Medicare Other | Admitting: Occupational Therapy

## 2012-11-24 ENCOUNTER — Ambulatory Visit: Payer: Medicare Other | Admitting: Physical Therapy

## 2012-11-24 ENCOUNTER — Ambulatory Visit: Payer: Medicare Other | Admitting: Occupational Therapy

## 2012-11-29 ENCOUNTER — Ambulatory Visit: Payer: Medicare Other | Admitting: Occupational Therapy

## 2012-11-29 ENCOUNTER — Ambulatory Visit: Payer: Medicare Other | Admitting: Physical Therapy

## 2012-12-01 ENCOUNTER — Ambulatory Visit: Payer: Medicare Other | Admitting: Occupational Therapy

## 2012-12-01 ENCOUNTER — Ambulatory Visit: Payer: Medicare Other | Admitting: Physical Therapy

## 2012-12-02 ENCOUNTER — Telehealth: Payer: Self-pay | Admitting: *Deleted

## 2012-12-02 NOTE — Telephone Encounter (Signed)
Called patient to make a 2 month stroke  F/u no one answered. LVM asking the patient  To return my call.

## 2012-12-06 ENCOUNTER — Ambulatory Visit: Payer: Medicare Other | Admitting: Occupational Therapy

## 2012-12-06 ENCOUNTER — Ambulatory Visit: Payer: Medicare Other | Admitting: Physical Therapy

## 2012-12-08 ENCOUNTER — Ambulatory Visit: Payer: Medicare Other | Admitting: Physical Therapy

## 2012-12-08 ENCOUNTER — Encounter: Payer: Medicare Other | Admitting: Occupational Therapy

## 2012-12-15 ENCOUNTER — Other Ambulatory Visit: Payer: Self-pay

## 2013-02-14 ENCOUNTER — Ambulatory Visit: Payer: Self-pay | Admitting: Neurology

## 2013-04-05 ENCOUNTER — Ambulatory Visit: Payer: Self-pay | Admitting: Neurology

## 2013-07-27 ENCOUNTER — Ambulatory Visit: Payer: Self-pay | Admitting: Neurology

## 2013-09-26 ENCOUNTER — Other Ambulatory Visit (HOSPITAL_COMMUNITY): Payer: Self-pay | Admitting: Internal Medicine

## 2013-09-26 DIAGNOSIS — Z1231 Encounter for screening mammogram for malignant neoplasm of breast: Secondary | ICD-10-CM

## 2013-10-26 ENCOUNTER — Ambulatory Visit (HOSPITAL_COMMUNITY): Payer: Medicare Other | Attending: Internal Medicine

## 2013-11-24 ENCOUNTER — Other Ambulatory Visit: Payer: Self-pay

## 2014-05-29 ENCOUNTER — Other Ambulatory Visit: Payer: Self-pay | Admitting: Internal Medicine

## 2014-05-29 DIAGNOSIS — Z1231 Encounter for screening mammogram for malignant neoplasm of breast: Secondary | ICD-10-CM

## 2014-06-05 ENCOUNTER — Ambulatory Visit: Payer: Medicare Other

## 2015-05-11 ENCOUNTER — Encounter (HOSPITAL_COMMUNITY): Payer: Self-pay | Admitting: *Deleted

## 2015-05-11 ENCOUNTER — Inpatient Hospital Stay (HOSPITAL_COMMUNITY)
Admission: AD | Admit: 2015-05-11 | Discharge: 2015-05-11 | Disposition: A | Discharge status: Home / Self Care | Payer: Medicare Other | Source: Ambulatory Visit | Attending: Obstetrics & Gynecology | Admitting: Obstetrics & Gynecology

## 2015-05-11 DIAGNOSIS — N764 Abscess of vulva: Secondary | ICD-10-CM | POA: Insufficient documentation

## 2015-05-11 DIAGNOSIS — Z8673 Personal history of transient ischemic attack (TIA), and cerebral infarction without residual deficits: Secondary | ICD-10-CM | POA: Insufficient documentation

## 2015-05-11 DIAGNOSIS — K219 Gastro-esophageal reflux disease without esophagitis: Secondary | ICD-10-CM | POA: Insufficient documentation

## 2015-05-11 DIAGNOSIS — R102 Pelvic and perineal pain: Secondary | ICD-10-CM | POA: Diagnosis present

## 2015-05-11 DIAGNOSIS — F319 Bipolar disorder, unspecified: Secondary | ICD-10-CM | POA: Insufficient documentation

## 2015-05-11 DIAGNOSIS — I10 Essential (primary) hypertension: Secondary | ICD-10-CM | POA: Insufficient documentation

## 2015-05-11 DIAGNOSIS — N762 Acute vulvitis: Secondary | ICD-10-CM | POA: Insufficient documentation

## 2015-05-11 DIAGNOSIS — F17210 Nicotine dependence, cigarettes, uncomplicated: Secondary | ICD-10-CM | POA: Insufficient documentation

## 2015-05-11 LAB — CBC
HCT: 35.8 % — ABNORMAL LOW (ref 36.0–46.0)
HEMOGLOBIN: 12.2 g/dL (ref 12.0–15.0)
MCH: 29.5 pg (ref 26.0–34.0)
MCHC: 34.1 g/dL (ref 30.0–36.0)
MCV: 86.5 fL (ref 78.0–100.0)
PLATELETS: 209 10*3/uL (ref 150–400)
RBC: 4.14 MIL/uL (ref 3.87–5.11)
RDW: 14.7 % (ref 11.5–15.5)
WBC: 7.6 10*3/uL (ref 4.0–10.5)

## 2015-05-11 MED ORDER — IBUPROFEN 600 MG PO TABS
600.0000 mg | ORAL_TABLET | Freq: Once | ORAL | Status: AC
  Administered 2015-05-11: 600 mg via ORAL
  Filled 2015-05-11: qty 1

## 2015-05-11 MED ORDER — OXYCODONE-ACETAMINOPHEN 5-325 MG PO TABS
1.0000 | ORAL_TABLET | Freq: Once | ORAL | Status: AC
  Administered 2015-05-11: 1 via ORAL
  Filled 2015-05-11: qty 1

## 2015-05-11 MED ORDER — SULFAMETHOXAZOLE-TRIMETHOPRIM 800-160 MG PO TABS: 1.0000 | ORAL_TABLET | Freq: Two times a day (BID) | ORAL | Status: AC

## 2015-05-11 MED ORDER — OXYCODONE-ACETAMINOPHEN 5-325 MG PO TABS: 1.0000 | ORAL_TABLET | ORAL | Status: DC | PRN

## 2015-05-11 NOTE — Discharge Instructions (Signed)
Abscess °An abscess is an infected area that contains a collection of pus and debris. It can occur in almost any part of the body. An abscess is also known as a furuncle or boil. °CAUSES  °An abscess occurs when tissue gets infected. This can occur from blockage of oil or sweat glands, infection of hair follicles, or a minor injury to the skin. As the body tries to fight the infection, pus collects in the area and creates pressure under the skin. This pressure causes pain. People with weakened immune systems have difficulty fighting infections and get certain abscesses more often.  °SYMPTOMS °Usually an abscess develops on the skin and becomes a painful mass that is red, warm, and tender. If the abscess forms under the skin, you may feel a moveable soft area under the skin. Some abscesses break open (rupture) on their own, but most will continue to get worse without care. The infection can spread deeper into the body and eventually into the bloodstream, causing you to feel ill.  °DIAGNOSIS  °Your caregiver will take your medical history and perform a physical exam. A sample of fluid may also be taken from the abscess to determine what is causing your infection. °TREATMENT  °Your caregiver may prescribe antibiotic medicines to fight the infection. However, taking antibiotics alone usually does not cure an abscess. Your caregiver may need to make a small cut (incision) in the abscess to drain the pus. In some cases, gauze is packed into the abscess to reduce pain and to continue draining the area. °HOME CARE INSTRUCTIONS  °· Only take over-the-counter or prescription medicines for pain, discomfort, or fever as directed by your caregiver. °· If you were prescribed antibiotics, take them as directed. Finish them even if you start to feel better. °· If gauze is used, follow your caregiver's directions for changing the gauze. °· To avoid spreading the infection: °· Keep your draining abscess covered with a  bandage. °· Wash your hands well. °· Do not share personal care items, towels, or whirlpools with others. °· Avoid skin contact with others. °· Keep your skin and clothes clean around the abscess. °· Keep all follow-up appointments as directed by your caregiver. °SEEK MEDICAL CARE IF:  °· You have increased pain, swelling, redness, fluid drainage, or bleeding. °· You have muscle aches, chills, or a general ill feeling. °· You have a fever. °MAKE SURE YOU:  °· Understand these instructions. °· Will watch your condition. °· Will get help right away if you are not doing well or get worse. °  °This information is not intended to replace advice given to you by your health care provider. Make sure you discuss any questions you have with your health care provider. °  °Document Released: 11/05/2004 Document Revised: 07/28/2011 Document Reviewed: 04/10/2011 °Elsevier Interactive Patient Education ©2016 Elsevier Inc. ° °Incision and Drainage °Incision and drainage is a procedure in which a sac-like structure (cystic structure) is opened and drained. The area to be drained usually contains material such as pus, fluid, or blood.  °LET YOUR CAREGIVER KNOW ABOUT:  °· Allergies to medicine. °· Medicines taken, including vitamins, herbs, eyedrops, over-the-counter medicines, and creams. °· Use of steroids (by mouth or creams). °· Previous problems with anesthetics or numbing medicines. °· History of bleeding problems or blood clots. °· Previous surgery. °· Other health problems, including diabetes and kidney problems. °· Possibility of pregnancy, if this applies. °RISKS AND COMPLICATIONS °· Pain. °· Bleeding. °· Scarring. °· Infection. °BEFORE THE PROCEDURE  °  You may need to have an ultrasound or other imaging tests to see how large or deep your cystic structure is. Blood tests may also be used to determine if you have an infection or how severe the infection is. You may need to have a tetanus shot. °PROCEDURE  °The affected area  is cleaned with a cleaning fluid. The cyst area will then be numbed with a medicine (local anesthetic). A small incision will be made in the cystic structure. A syringe or catheter may be used to drain the contents of the cystic structure, or the contents may be squeezed out. The area will then be flushed with a cleansing solution. After cleansing the area, it is often gently packed with a gauze or another wound dressing. Once it is packed, it will be covered with gauze and tape or some other type of wound dressing.  °AFTER THE PROCEDURE  °· Often, you will be allowed to go home right after the procedure. °· You may be given antibiotic medicine to prevent or heal an infection. °· If the area was packed with gauze or some other wound dressing, you will likely need to come back in 1 to 2 days to get it removed. °· The area should heal in about 14 days. °  °This information is not intended to replace advice given to you by your health care provider. Make sure you discuss any questions you have with your health care provider. °  °Document Released: 07/22/2000 Document Revised: 07/28/2011 Document Reviewed: 03/23/2011 °Elsevier Interactive Patient Education ©2016 Elsevier Inc. ° °

## 2015-05-11 NOTE — MAU Provider Note (Signed)
History     CSN: [id number]  Arrival date and time: 05/11/15 1112   First Provider Initiated Contact with Patient 05/11/15 1228      No chief complaint on file.  HPI   [name] is a  60 y.o. female G1P1001 at presenting with a 2 history of labial/ vaginal pain. 2 days ago she started experiencing groin pain; and yesterday she started experiencing swelling in that area, which appears to be spreading.   In the last 24 hours the area has grown in size and has become more painful. The area is red.   + fever; low grade " had flu like symptoms yesterday".  She has never had this happen before.  [facility] and [facility] told to come here.   OB History    Gravida Para Term Preterm AB TAB SAB Ectopic Multiple Living   1 1 1       1       Past Medical History  Diagnosis Date  . Arthritis   . Depression   . GERD (gastroesophageal reflux disease)   . HTN (hypertension)   . Bipolar disorder ([place])   . Stroke [facility]) 09/2012    slurred speech and left sided weakness/notes 10/04/2012    Past Surgical History  Procedure Laterality Date  . Cesarean section  1986  . Fracture of leg      2 months ago    Family History  Problem Relation Age of Onset  . Breast cancer Mother   . Diabetes Brother   . Diabetes Sister   . Diabetes Paternal Aunt   . Breast cancer Maternal Aunt   . Breast cancer Paternal Aunt   . Colon cancer Paternal Uncle     Social History  Substance Use Topics  . Smoking status: Current Every Day Smoker -- 0.50 packs/day for 37 years    Types: Cigarettes  . Smokeless tobacco: Never Used  . Alcohol Use: Yes     Comment: 1 per week    Allergies: No Known Allergies  No prescriptions prior to admission   Results for orders placed or performed during the hospital encounter of 05/11/15 (from the past 48 hour(s))  CBC     Status: Abnormal   Collection Time: 05/11/15  1:36 PM  Result Value Ref Range   WBC 7.6 4.0 - 10.5 K/uL   RBC 4.14  3.87 - 5.11 MIL/uL   Hemoglobin 12.2 12.0 - 15.0 g/dL   HCT 35.8 (L) 36.0 - 46.0 %   MCV 86.5 78.0 - 100.0 fL   MCH 29.5 26.0 - 34.0 pg   MCHC 34.1 30.0 - 36.0 g/dL   RDW 14.7 11.5 - 15.5 %   Platelets 209 150 - 400 K/uL    Review of Systems  Constitutional: Positive for chills. Negative for fever.  Gastrointestinal: Negative for nausea, vomiting and abdominal pain.   Physical Exam   Blood pressure 147/87, pulse 87, temperature 100.1 F (37.8 C), resp. rate 16.  Physical Exam  Constitutional: She is oriented to person, place, and time. She appears well-developed and well-nourished. No distress.  HENT:  Head: Normocephalic.  Respiratory: Effort normal.  Genitourinary:    There is tenderness on the left labia.  Indurated, left labial abscess extending up into the mons pubis; cellulitis noted. Tenderness with palpation. No drainage noted.   Neurological: She is alert and oriented to person, place, and time.  Skin: Skin is warm. She is not diaphoretic.    MAU  Course  Procedures  None  MDM  CBC Hgb A1C  Dr. Elonda [name] called to assess the patient and the site. Please see his note.   Assessment and Plan   A:  1. Labial abscess   2. Vulval cellulitis     P:  Discharge home in stable condition RX: Bactrim X 14 days, Percocet  Return to MAU if symptoms worsen Go to the [facility] on Monday @ 1300 for evaluation.  Surgical consult Vs. I&D   Tammy Lye, NP 05/11/2015 4:56 PM

## 2015-05-11 NOTE — MAU Note (Signed)
Pt sent to MAU after being evaluated by urgent care for a vaginal abscess. Pt states she noticed an abscess that started on Tuesday and has gotten worse throughout the week

## 2015-05-13 ENCOUNTER — Encounter: Payer: Self-pay | Admitting: Obstetrics & Gynecology

## 2015-05-13 ENCOUNTER — Ambulatory Visit (INDEPENDENT_AMBULATORY_CARE_PROVIDER_SITE_OTHER): Payer: Medicare Other | Admitting: Obstetrics & Gynecology

## 2015-05-13 VITALS — BP 131/76 | HR 70 | Temp 98.1°F | Ht 62.0 in | Wt 165.0 lb

## 2015-05-13 DIAGNOSIS — N764 Abscess of vulva: Secondary | ICD-10-CM

## 2015-05-13 LAB — HEMOGLOBIN A1C
Hgb A1c MFr Bld: 5.9 % — ABNORMAL HIGH (ref 4.8–5.6)
Mean Plasma Glucose: 123 mg/dL

## 2015-05-13 MED ORDER — IBUPROFEN 200 MG PO TABS
600.0000 mg | ORAL_TABLET | Freq: Once | ORAL | Status: AC
  Administered 2015-05-13: 600 mg via ORAL

## 2015-05-13 NOTE — Patient Instructions (Signed)

## 2015-05-13 NOTE — Progress Notes (Signed)
Patient ID: Tammy [name], female   DOB: Oct 03, 1955, 60 y.o.   MRN: [id number]  Chief Complaint  Patient presents with  . Follow-up  s/p MAU visit for vulvar abscess  HPI Tammy [name] is a 60 y.o. female.  G1P1001 No LMP recorded. Patient is postmenopausal.  Presented to MAU 4/1  HPI  with a 2 day history of labial/ vaginal pain. 2 days ago she started experiencing groin pain; and Friday she started experiencing swelling in that area, which appeared to be spreading.the area had grown in size and has became more painful.   Past Medical History  Diagnosis Date  . Arthritis   . Depression   . GERD (gastroesophageal reflux disease)   . HTN (hypertension)   . Bipolar disorder ([place])   . Stroke [facility]) 09/2012    slurred speech and left sided weakness/notes 10/04/2012    Past Surgical History  Procedure Laterality Date  . Cesarean section  1986  . Fracture of leg      2 months ago    Family History  Problem Relation Age of Onset  . Breast cancer Mother   . Diabetes Brother   . Diabetes Sister   . Diabetes Paternal Aunt   . Breast cancer Maternal Aunt   . Breast cancer Paternal Aunt   . Colon cancer Paternal Uncle     Social History Social History  Substance Use Topics  . Smoking status: Former Smoker -- 0.50 packs/day for 37 years    Types: Cigarettes  . Smokeless tobacco: Never Used  . Alcohol Use: 0.0 oz/week    0 Standard drinks or equivalent per week     Comment: 1 per week    No Known Allergies  Current Outpatient Prescriptions  Medication Sig Dispense Refill  . oxyCODONE-acetaminophen (PERCOCET/ROXICET) 5-325 MG tablet Take 1 tablet by mouth every 4 (four) hours as needed for severe pain. 10 tablet 0  . sulfamethoxazole-trimethoprim (BACTRIM DS,SEPTRA DS) 800-160 MG tablet Take 1 tablet by mouth 2 (two) times daily. 28 tablet 0   No current facility-administered medications for this visit.    Review of Systems Review of Systems  Constitutional:  Negative.   Genitourinary: Positive for vaginal discharge (drainage from vulva). Pelvic pain: vulvar pain much reduced.    Blood pressure 131/76, pulse 70, temperature 98.1 F (36.7 C), temperature source Oral, height 5\' 2"  (1.575 m), weight 165 lb (74.844 kg).  Physical Exam Physical Exam  Constitutional: She appears well-developed. No distress.  Pulmonary/Chest: Effort normal.  Genitourinary: No vaginal discharge found.  Vulva with 3-4 cm mass left superior c/w abscess and scant drainage. Consented to I&D, prepped with iodine and 1%lidocane infiltrated, incised with 11 blade and small amount of pus expressed, Word cath with 4 cc placed  Psychiatric: She has a normal mood and affect. Her behavior is normal.    Data Reviewed MAU note  Assessment    Left vulvar abscess     Plan    Word cath to  Maintain, warm soaks, RTC 1 week, finish ABX as Rx      ARNOLD,[name] 05/13/2015, 2:03 PM

## 2015-05-23 ENCOUNTER — Ambulatory Visit (INDEPENDENT_AMBULATORY_CARE_PROVIDER_SITE_OTHER): Payer: Medicare Other | Admitting: Family Medicine

## 2015-05-23 ENCOUNTER — Encounter: Payer: Self-pay | Admitting: Family Medicine

## 2015-05-23 VITALS — BP 156/97 | HR 114 | Ht 62.0 in | Wt 164.0 lb

## 2015-05-23 DIAGNOSIS — N764 Abscess of vulva: Secondary | ICD-10-CM | POA: Diagnosis not present

## 2015-05-23 NOTE — Progress Notes (Signed)
   Subjective:    Patient ID: Tammy [name], female    DOB: 03/13/1955, 60 y.o.   MRN: [id number]  HPI Patient seen for followup of vulvar abscess, which was I&D 10 days ago.  Has been on bactrim.  Has had scant drainage.  Feels that things are improving.  GI upset on bactrim - improved on yogurt.   Review of Systems  Constitutional: Negative for fever, chills and fatigue.  Gastrointestinal: Negative for nausea, vomiting, abdominal pain and constipation.       Objective:   Physical Exam  Constitutional: She appears well-developed and well-nourished.  HENT:  Head: Normocephalic and atraumatic.  Right Ear: External ear normal.  Left Ear: External ear normal.  Abdominal: Soft. Bowel sounds are normal. She exhibits no distension and no mass. There is no tenderness. There is no rebound and no guarding.  Genitourinary:     Skin: Skin is warm and dry.  Psychiatric: She has a normal mood and affect. Her behavior is normal. Judgment and thought content normal.       Assessment & Plan:  1. Vulvar abscess Word catheter removed.  Incision packed with 15cm of 1/2" iodoform ribbon.  Patient instructed to remove 1/2" every other day.  Pt to return in 1 week. Continue bactrim

## 2015-05-31 ENCOUNTER — Ambulatory Visit (INDEPENDENT_AMBULATORY_CARE_PROVIDER_SITE_OTHER): Payer: Medicare Other | Admitting: Family Medicine

## 2015-05-31 ENCOUNTER — Encounter: Payer: Self-pay | Admitting: Family Medicine

## 2015-05-31 VITALS — BP 172/90 | HR 68 | Wt 171.3 lb

## 2015-05-31 DIAGNOSIS — N764 Abscess of vulva: Secondary | ICD-10-CM | POA: Diagnosis not present

## 2015-05-31 NOTE — Progress Notes (Signed)
   Subjective:    Patient ID: Tammy [name], female    DOB: 1955-03-12, 61 y.o.   MRN: [id number]  HPI Patient seen for follow up of vulvar abscess.  She reports that things are improved.  Had pulled out part of the packing, but stopped.  The edge of the packing not seen.  Patient thinks that the packing may have fallen out.   Review of Systems     Objective:   Physical Exam  Constitutional: She appears well-developed and well-nourished.  Genitourinary:          Assessment & Plan:  1.  Vulvar abscess -return with any concerns or worsening.  Patient has some scar tissue, which should resolve.  Patient to follow up if begins to worsen, drain purulent discharge, or has worsening discomfort.

## 2016-02-10 HISTORY — PX: ANKLE FRACTURE SURGERY: SHX122

## 2016-03-18 ENCOUNTER — Other Ambulatory Visit (HOSPITAL_COMMUNITY): Payer: Self-pay | Admitting: Nurse Practitioner

## 2016-03-18 DIAGNOSIS — B182 Chronic viral hepatitis C: Secondary | ICD-10-CM

## 2016-03-18 DIAGNOSIS — B1920 Unspecified viral hepatitis C without hepatic coma: Secondary | ICD-10-CM

## 2016-04-08 ENCOUNTER — Ambulatory Visit (HOSPITAL_COMMUNITY)
Admission: RE | Admit: 2016-04-08 | Discharge: 2016-04-08 | Disposition: A | Discharge status: Home / Self Care | Payer: Medicare HMO | Source: Ambulatory Visit | Attending: Nurse Practitioner | Admitting: Nurse Practitioner

## 2016-04-08 DIAGNOSIS — K8020 Calculus of gallbladder without cholecystitis without obstruction: Secondary | ICD-10-CM | POA: Diagnosis not present

## 2016-04-08 DIAGNOSIS — B182 Chronic viral hepatitis C: Secondary | ICD-10-CM | POA: Diagnosis not present

## 2016-04-08 DIAGNOSIS — R932 Abnormal findings on diagnostic imaging of liver and biliary tract: Secondary | ICD-10-CM | POA: Insufficient documentation

## 2016-04-08 DIAGNOSIS — B1920 Unspecified viral hepatitis C without hepatic coma: Secondary | ICD-10-CM

## 2016-04-08 DIAGNOSIS — K838 Other specified diseases of biliary tract: Secondary | ICD-10-CM | POA: Insufficient documentation

## 2016-09-17 ENCOUNTER — Other Ambulatory Visit: Payer: Self-pay | Admitting: Nurse Practitioner

## 2016-09-17 DIAGNOSIS — K7400 Hepatic fibrosis, unspecified: Secondary | ICD-10-CM

## 2016-10-01 ENCOUNTER — Ambulatory Visit
Admission: RE | Admit: 2016-10-01 | Discharge: 2016-10-01 | Disposition: A | Discharge status: Home / Self Care | Payer: Medicare Other | Source: Ambulatory Visit | Attending: Internal Medicine | Admitting: Internal Medicine

## 2016-10-01 ENCOUNTER — Ambulatory Visit
Admission: RE | Admit: 2016-10-01 | Discharge: 2016-10-01 | Disposition: A | Discharge status: Home / Self Care | Payer: Medicare Other | Source: Ambulatory Visit | Attending: Nurse Practitioner | Admitting: Nurse Practitioner

## 2016-10-01 ENCOUNTER — Other Ambulatory Visit: Payer: Self-pay | Admitting: Internal Medicine

## 2016-10-01 DIAGNOSIS — R059 Cough, unspecified: Secondary | ICD-10-CM

## 2016-10-01 DIAGNOSIS — K7400 Hepatic fibrosis, unspecified: Secondary | ICD-10-CM

## 2016-10-01 DIAGNOSIS — R05 Cough: Secondary | ICD-10-CM

## 2016-10-19 ENCOUNTER — Other Ambulatory Visit: Payer: Self-pay | Admitting: Internal Medicine

## 2016-10-19 DIAGNOSIS — Z1231 Encounter for screening mammogram for malignant neoplasm of breast: Secondary | ICD-10-CM

## 2016-12-07 ENCOUNTER — Other Ambulatory Visit: Payer: Self-pay | Admitting: Internal Medicine

## 2016-12-07 DIAGNOSIS — Z139 Encounter for screening, unspecified: Secondary | ICD-10-CM

## 2016-12-28 ENCOUNTER — Ambulatory Visit
Admission: RE | Admit: 2016-12-28 | Discharge: 2016-12-28 | Disposition: A | Discharge status: Home / Self Care | Payer: Medicare HMO | Source: Ambulatory Visit | Attending: Internal Medicine | Admitting: Internal Medicine

## 2016-12-28 DIAGNOSIS — Z139 Encounter for screening, unspecified: Secondary | ICD-10-CM

## 2017-03-17 ENCOUNTER — Other Ambulatory Visit: Payer: Self-pay | Admitting: Nurse Practitioner

## 2017-03-17 DIAGNOSIS — K7469 Other cirrhosis of liver: Secondary | ICD-10-CM

## 2017-03-29 ENCOUNTER — Other Ambulatory Visit: Payer: Medicare HMO

## 2017-09-16 ENCOUNTER — Other Ambulatory Visit: Payer: Self-pay | Admitting: Nurse Practitioner

## 2017-09-16 DIAGNOSIS — K7469 Other cirrhosis of liver: Secondary | ICD-10-CM

## 2017-09-24 ENCOUNTER — Ambulatory Visit
Admission: RE | Admit: 2017-09-24 | Discharge: 2017-09-24 | Disposition: A | Discharge status: Home / Self Care | Payer: Medicare HMO | Source: Ambulatory Visit | Attending: Nurse Practitioner | Admitting: Nurse Practitioner

## 2017-09-24 DIAGNOSIS — K7469 Other cirrhosis of liver: Secondary | ICD-10-CM

## 2018-02-18 ENCOUNTER — Other Ambulatory Visit: Payer: Self-pay | Admitting: Internal Medicine

## 2018-02-18 DIAGNOSIS — Z1231 Encounter for screening mammogram for malignant neoplasm of breast: Secondary | ICD-10-CM

## 2018-03-11 ENCOUNTER — Ambulatory Visit
Admission: RE | Admit: 2018-03-11 | Discharge: 2018-03-11 | Disposition: A | Discharge status: Home / Self Care | Payer: Medicare HMO | Source: Ambulatory Visit | Attending: Internal Medicine | Admitting: Internal Medicine

## 2018-03-11 DIAGNOSIS — Z1231 Encounter for screening mammogram for malignant neoplasm of breast: Secondary | ICD-10-CM

## 2018-03-23 ENCOUNTER — Other Ambulatory Visit: Payer: Self-pay | Admitting: Nurse Practitioner

## 2018-03-23 DIAGNOSIS — K7469 Other cirrhosis of liver: Secondary | ICD-10-CM

## 2018-03-28 ENCOUNTER — Other Ambulatory Visit: Payer: Medicare HMO

## 2018-03-30 ENCOUNTER — Ambulatory Visit
Admission: RE | Admit: 2018-03-30 | Discharge: 2018-03-30 | Disposition: A | Discharge status: Home / Self Care | Payer: Medicare HMO | Source: Ambulatory Visit | Attending: Nurse Practitioner | Admitting: Nurse Practitioner

## 2018-03-30 DIAGNOSIS — K7469 Other cirrhosis of liver: Secondary | ICD-10-CM

## 2018-04-11 ENCOUNTER — Other Ambulatory Visit: Payer: Self-pay | Admitting: Nurse Practitioner

## 2018-04-11 DIAGNOSIS — J918 Pleural effusion in other conditions classified elsewhere: Secondary | ICD-10-CM

## 2018-04-11 DIAGNOSIS — K769 Liver disease, unspecified: Secondary | ICD-10-CM

## 2018-04-11 DIAGNOSIS — K7469 Other cirrhosis of liver: Secondary | ICD-10-CM

## 2018-09-03 ENCOUNTER — Other Ambulatory Visit: Payer: Self-pay

## 2018-09-03 ENCOUNTER — Ambulatory Visit
Admission: RE | Admit: 2018-09-03 | Discharge: 2018-09-03 | Disposition: A | Discharge status: Home / Self Care | Payer: Medicare HMO | Source: Ambulatory Visit | Attending: Nurse Practitioner | Admitting: Nurse Practitioner

## 2018-09-03 DIAGNOSIS — J918 Pleural effusion in other conditions classified elsewhere: Secondary | ICD-10-CM

## 2018-09-03 DIAGNOSIS — K7469 Other cirrhosis of liver: Secondary | ICD-10-CM

## 2018-09-03 MED ORDER — GADOBENATE DIMEGLUMINE 529 MG/ML IV SOLN
13.0000 mL | Freq: Once | INTRAVENOUS | Status: AC | PRN
  Administered 2018-09-03: 13 mL via INTRAVENOUS

## 2018-10-06 ENCOUNTER — Other Ambulatory Visit: Payer: Self-pay | Admitting: Internal Medicine

## 2018-10-06 DIAGNOSIS — Z1231 Encounter for screening mammogram for malignant neoplasm of breast: Secondary | ICD-10-CM

## 2018-12-01 ENCOUNTER — Encounter: Payer: Self-pay | Admitting: Neurology

## 2018-12-01 ENCOUNTER — Other Ambulatory Visit: Payer: Self-pay

## 2018-12-01 ENCOUNTER — Ambulatory Visit (INDEPENDENT_AMBULATORY_CARE_PROVIDER_SITE_OTHER): Payer: Medicare HMO | Admitting: Neurology

## 2018-12-01 VITALS — BP 150/84 | HR 56 | Temp 96.5°F | Ht 62.5 in | Wt 153.0 lb

## 2018-12-01 DIAGNOSIS — G603 Idiopathic progressive neuropathy: Secondary | ICD-10-CM | POA: Diagnosis not present

## 2018-12-01 MED ORDER — GABAPENTIN 300 MG PO CAPS: 300.0000 mg | ORAL_CAPSULE | Freq: Every day | ORAL | 6 refills | Status: DC

## 2018-12-01 NOTE — Patient Instructions (Signed)

## 2018-12-01 NOTE — Progress Notes (Signed)
[facility]    Provider:  Dr Jaynee Eagles Requesting Provider: [name], MD Primary Care Provider:  [name], MD  CC:  neuropathy  HPI:  Tammy [name] is a 63 y.o. female here as requested by [name], MD for neuropathy. PMHx HTN, CVA and left-sided weakness, hyperkalemia, anxiety, bipolar disorder, chronic pain syndrome, nicotine dependence. Symptoms started about a year. Numbness in the toes and now the feet. But she has soreness the back of the thighs. She wonders if it is circulation issues. She has cramping in her legs. Numbness in feet and toes bother her. She had injury to the back years ago. She has low back pain, she has pinched disks and this is why she left work because of back problems. The back hurts midline. She is a smoker. She is on gabapentin and helping. Worse in the mornings with back stiffness and prox LE cramping. Gabapentin helps. No symptoms in the hands or the upper body. She feels she is not exercising as much, no focal weakness. Residual left-sided weakness and sensory changes since stroke.   Reviewed notes, labs and imaging from outside physicians, which showed:  Reviewed report from 2014 MRI/MRA brain/head IMPRESSION: MRI HEAD IMPRESSION  1. Acute right corona radiata to lentiform nuclei lacunar infarct. No mass effect or hemorrhage.  2. Underlying signal changes suggestive of chronic white matter small vessel ischemia.  MRA HEAD IMPRESSION  Negative intracranial MRA.  hgba1c 5.9 05/11/2015   Review of Systems: Patient complains of symptoms per HPI as well as the following symptoms leg weakness, cramps, numbness. Pertinent negatives and positives per HPI. All others negative.   Social History   Socioeconomic History  . Marital status: Divorced    Spouse name: Not on file  . Number of children: 2  . Years of education: Not on file  . Highest education level: Not on file  Occupational History  . Occupation:  diabled  Social Needs  . Financial resource strain: Not on file  . Food insecurity    Worry: Not on file    Inability: Not on file  . Transportation needs    Medical: Not on file    Non-medical: Not on file  Tobacco Use  . Smoking status: Current Every Day Smoker    Packs/day: 0.50    Years: 40.00    Pack years: 20.00    Types: Cigarettes  . Smokeless tobacco: Never Used  . Tobacco comment: has used e-cigarettes, no vaping   Substance and Sexual Activity  . Alcohol use: Not Currently    Alcohol/week: 0.0 standard drinks    Comment: 1 per week  . Drug use: No  . Sexual activity: Not on file  Lifestyle  . Physical activity    Days per week: Not on file    Minutes per session: Not on file  . Stress: Not on file  Relationships  . Social Herbalist on phone: Not on file    Gets together: Not on file    Attends religious service: Not on file    Active member of club or organization: Not on file    Attends meetings of clubs or organizations: Not on file    Relationship status: Not on file  . Intimate partner violence    Fear of current or ex partner: Not on file    Emotionally abused: Not on file    Physically abused: Not on file    Forced sexual activity: Not on file  Other Topics Concern  . Not on file  Social History Narrative   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    Family History  Problem Relation Age of Onset  . Breast cancer Mother   . Other Mother        blood clots  . Diabetes Brother   . Neuropathy Brother        diabetic neuropathy  . Diabetes Sister   . Neuropathy Sister        diabetic neuropathy  . Diabetes Paternal Aunt   . Breast cancer Maternal Aunt   . Breast cancer Paternal Aunt   . Colon cancer Paternal Uncle     Past Medical History:  Diagnosis Date  . Arthritis   . Bipolar disorder ([place])   . Depression   . GERD (gastroesophageal reflux disease)   . HTN (hypertension)   . Stroke [facility]) 09/2012   slurred  speech and left sided weakness/notes 10/04/2012    Patient Active Problem List   Diagnosis Date Noted  . Chronic pain syndrome 10/06/2012  . Nicotine dependence 10/06/2012  . Anxiety state, unspecified 10/05/2012  . Bipolar disorder, unspecified ([place]) 10/05/2012  . Slurred speech 10/04/2012  . Left-sided weakness 10/04/2012  . Hyperkalemia 10/04/2012  . Acute CVA (cerebrovascular accident) ([place]) 10/04/2012  . HTN (hypertension)     Past Surgical History:  Procedure Laterality Date  . CESAREAN SECTION  1986  . fracture of leg     2 months ago    Current Outpatient Medications  Medication Sig Dispense Refill  . Acetaminophen (TYLENOL PO) Take by mouth as needed.    [name] amLODipine (NORVASC) 10 MG tablet Take 10 mg by mouth daily.    [name] atenolol (TENORMIN) 25 MG tablet Take 25 mg by mouth daily.    . hydrochlorothiazide (HYDRODIURIL) 25 MG tablet Take 25 mg by mouth daily.    [name] HYDROcodone-acetaminophen (NORCO/VICODIN) 5-325 MG tablet Take 1 tablet by mouth 3 (three) times daily as needed.    . IBUPROFEN PO Take by mouth as needed.    [name] lisinopril (ZESTRIL) 20 MG tablet Take 20 mg by mouth daily.    . Multiple Vitamin (MULTIVITAMIN PO) Take by mouth daily.    . pantoprazole (PROTONIX) 40 MG tablet Take 40 mg by mouth daily.    [name] gabapentin (NEURONTIN) 300 MG capsule Take 1-3 capsules (300-900 mg total) by mouth at bedtime. 90 capsule 6   No current facility-administered medications for this visit.     Allergies as of 12/01/2018  . (No Known Allergies)    Vitals: BP (!) 150/84 (BP Location: Left Arm, Patient Position: Sitting)   Pulse (!) 56   Temp (!) 96.5 F (35.8 C) Comment: taken at front door  Ht 5' 2.5" (1.588 m)   Wt 153 lb (69.4 kg)   BMI 27.54 kg/m  Last Weight:  Wt Readings from Last 1 Encounters:  12/01/18 153 lb (69.4 kg)   Last Height:   Ht Readings from Last 1 Encounters:  12/01/18 5' 2.5" (1.588 m)     Physical exam: Exam: Gen: NAD, conversant,  well nourised, well groomed                     CV: RRR, +SEM. No Carotid Bruits. No peripheral edema, warm, nontender Eyes: Conjunctivae clear without exudates or hemorrhage  Neuro: Detailed Neurologic Exam  Speech:    Speech is normal; fluent and spontaneous with normal comprehension.  Cognition:    The patient is oriented to person, place, and time;     recent and remote memory intact;     language fluent;     normal attention, concentration,     fund of knowledge Cranial Nerves:    The pupils are equal, round, and reactive to light. The fundi are normal and spontaneous venous pulsations are present. Visual fields are full to finger confrontation. Extraocular movements are intact. Trigeminal sensation is intact and the muscles of mastication are normal. The face is symmetric. The palate elevates in the midline. Hearing intact. Voice is normal. Shoulder shrug is normal. The tongue has normal motion without fasciculations.   Coordination:    Normal finger to nose   Gait:    Heel-toe and tandem gait with difficulty due to imbalance and weakness.   Motor Observation:    No asymmetry, no atrophy, and no involuntary movements noted. Flat feet, ankle weakness (familial) Tone:    Normal muscle tone.    Posture:    Posture is normal, she may bend at the waist slightly due to low back pain    Strength: left prox hip flexion 4/5, left leg flexion 4/5. Distal foot dorsifexion and plantar flexion weakness (cannot heel/toe) otherwise Strength is V/V in the upper and lower limbs.      Sensation: decreased pin prick to the ankles. Intact vibration     Reflex Exam:  DTR's:   Absent AJs.  .   Toes:    The toes are downgoing bilaterally.   Clonus:    Clonus is absent.    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  Wonder if this is Charcot Marie Tooth.   CMT genetic panel - will send free 80 panel and if negative will try to get the CMT panel for her as a reflex no charge  Increase gabapentin at betime  Orders Placed This Encounter  Procedures  . Hemoglobin A1c  . B12 and Folate Panel  . Methylmalonic acid, serum  . TSH  . Sjogren's syndrome antibods(ssa + ssb)  . Rheumatoid factor  . Heavy metals, blood  . Vitamin B6  . Multiple Myeloma Panel (SPEP&IFE w/QIG)  . Vitamin B1  . B. burgdorfi Antibody   Meds ordered this encounter  Medications  . gabapentin (NEURONTIN) 300 MG capsule    Sig: Take 1-3 capsules (300-900 mg total) by mouth at bedtime.    Dispense:  90 capsule    Refill:  6    Cc: [name], MD,  [name], MD  Sarina Ill, MD  [facility] [address] [place], [place] [address]  Phone [phone] Fax [phone]

## 2018-12-05 ENCOUNTER — Telehealth: Payer: Self-pay | Admitting: Neurology

## 2018-12-05 DIAGNOSIS — G603 Idiopathic progressive neuropathy: Secondary | ICD-10-CM | POA: Insufficient documentation

## 2018-12-05 NOTE — Telephone Encounter (Signed)
[place] comprehensive neuropathies and TTR genetic testing order form completed, signed and faxed to [facility]. I included a message request for [facility] to contact pt and mail a kit to her home. Received a receipt of confirmation.

## 2018-12-05 NOTE — Telephone Encounter (Signed)
Tammy [name], CMT possibly- please send  Free [facility] 80+ gene neuropathy panel and if negative will try to get the CMT panel for her as a reflex no charge

## 2018-12-08 LAB — MULTIPLE MYELOMA PANEL, SERUM
Albumin SerPl Elph-Mcnc: 3.5 g/dL (ref 2.9–4.4)
Albumin/Glob SerPl: 1.1 (ref 0.7–1.7)
Alpha 1: 0.3 g/dL (ref 0.0–0.4)
Alpha2 Glob SerPl Elph-Mcnc: 1 g/dL (ref 0.4–1.0)
B-Globulin SerPl Elph-Mcnc: 1 g/dL (ref 0.7–1.3)
Gamma Glob SerPl Elph-Mcnc: 1 g/dL (ref 0.4–1.8)
Globulin, Total: 3.3 g/dL (ref 2.2–3.9)
IgA/Immunoglobulin A, Serum: 228 mg/dL (ref 87–352)
IgG (Immunoglobin G), Serum: 1132 mg/dL (ref 586–1602)
IgM (Immunoglobulin M), Srm: 98 mg/dL (ref 26–217)
Total Protein: 6.8 g/dL (ref 6.0–8.5)

## 2018-12-08 LAB — HEMOGLOBIN A1C
Est. average glucose Bld gHb Est-mCnc: 114 mg/dL
Hgb A1c MFr Bld: 5.6 % (ref 4.8–5.6)

## 2018-12-08 LAB — RHEUMATOID FACTOR: Rhuematoid fact SerPl-aCnc: 10.1 IU/mL (ref 0.0–13.9)

## 2018-12-08 LAB — SJOGREN'S SYNDROME ANTIBODS(SSA + SSB)
ENA SSA (RO) Ab: <0.2 AI (ref 0.0–0.9)
ENA SSB (LA) Ab: 0.3 AI (ref 0.0–0.9)

## 2018-12-08 LAB — B. BURGDORFI ANTIBODIES: Lyme IgG/IgM Ab: <0.91 {ISR} (ref 0.00–0.90)

## 2018-12-08 LAB — METHYLMALONIC ACID, SERUM: Methylmalonic Acid: 126 nmol/L (ref 0–378)

## 2018-12-08 LAB — VITAMIN B1: Thiamine: 106 nmol/L (ref 66.5–200.0)

## 2018-12-08 LAB — HEAVY METALS, BLOOD
Arsenic: 8 ug/L (ref 2–23)
Lead, Blood: 1 ug/dL (ref 0–4)
Mercury: <1 ug/L (ref 0.0–14.9)

## 2018-12-08 LAB — TSH: TSH: 1.32 u[IU]/mL (ref 0.450–4.500)

## 2018-12-08 LAB — B12 AND FOLATE PANEL
Folate: 11.2 ng/mL (ref >3.0)
Vitamin B-12: 1507 pg/mL — ABNORMAL HIGH (ref 232–1245)

## 2018-12-08 LAB — VITAMIN B6: Vitamin B6: 5.5 ug/L (ref 2.0–32.8)

## 2018-12-29 ENCOUNTER — Encounter: Payer: Self-pay | Admitting: Neurology

## 2018-12-29 ENCOUNTER — Other Ambulatory Visit: Payer: Self-pay | Admitting: *Deleted

## 2018-12-29 MED ORDER — GABAPENTIN 300 MG PO CAPS: 300.0000 mg | ORAL_CAPSULE | Freq: Every day | ORAL | 1 refills | Status: DC

## 2018-12-29 NOTE — Telephone Encounter (Signed)
We received a mail order pharmacy request for gabapentin prescription. Per Dr. Jaynee Eagles, ok to send this in a 3 month supply to [facility]. Gabapentin 300 mg capsule e-scribed. I called [facility] on [address], spoke with Ria Comment, and put a hold on the gabapentin refills. She stated pt filled today in store #90 (one month supply).

## 2019-02-23 ENCOUNTER — Other Ambulatory Visit: Payer: Self-pay | Admitting: *Deleted

## 2019-02-23 MED ORDER — GABAPENTIN 300 MG PO CAPS: 300.0000 mg | ORAL_CAPSULE | Freq: Every day | ORAL | 0 refills | Status: DC

## 2019-02-23 NOTE — Telephone Encounter (Signed)
Received refill request for Gabapentin 300 mg to be sent to [facility]. I called [facility] and spoke with [facility]. I canceled the prescription refills on file. Last filled 12/30/2018. Spoke with [facility] Public affairs consultant. She stated they will get a kickback in events of refill requesting too soon.

## 2019-03-10 ENCOUNTER — Ambulatory Visit: Payer: Medicare Other | Attending: Internal Medicine

## 2019-03-10 DIAGNOSIS — Z20822 Contact with and (suspected) exposure to covid-19: Secondary | ICD-10-CM

## 2019-03-11 LAB — NOVEL CORONAVIRUS, NAA: SARS-CoV-2, NAA: NOT DETECTED

## 2019-03-13 ENCOUNTER — Ambulatory Visit
Admission: RE | Admit: 2019-03-13 | Discharge: 2019-03-13 | Disposition: A | Discharge status: Home / Self Care | Payer: Medicare Other | Source: Ambulatory Visit | Attending: Internal Medicine | Admitting: Internal Medicine

## 2019-03-13 ENCOUNTER — Other Ambulatory Visit: Payer: Self-pay

## 2019-03-13 DIAGNOSIS — Z1231 Encounter for screening mammogram for malignant neoplasm of breast: Secondary | ICD-10-CM

## 2019-04-03 ENCOUNTER — Ambulatory Visit: Payer: Medicare HMO | Admitting: Neurology

## 2019-04-07 ENCOUNTER — Other Ambulatory Visit: Payer: Self-pay | Admitting: Nurse Practitioner

## 2019-04-07 DIAGNOSIS — K7469 Other cirrhosis of liver: Secondary | ICD-10-CM

## 2019-04-10 ENCOUNTER — Ambulatory Visit: Payer: Medicare Other | Attending: Internal Medicine

## 2019-04-10 DIAGNOSIS — Z23 Encounter for immunization: Secondary | ICD-10-CM | POA: Insufficient documentation

## 2019-04-10 NOTE — Progress Notes (Signed)
   Covid-19 Vaccination Clinic  Name:  Tammy [name]    MRN: [id number] DOB: October 08, 1955  04/10/2019  Tammy [name] was observed post Covid-19 immunization for 15 minutes without incidence. She was provided with Vaccine Information Sheet and instruction to access the V-Safe system.   Tammy [name] was instructed to call 911 with any severe reactions post vaccine: [name] Difficulty breathing  . Swelling of your face and throat  . A fast heartbeat  . A bad rash all over your body  . Dizziness and weakness    Immunizations Administered    Name Date Dose VIS Date Route   Pfizer COVID-19 Vaccine 04/10/2019  6:12 PM 0.3 mL 01/20/2019 Intramuscular   Manufacturer: [place]   Lot: [id number]   [place]: [id number]

## 2019-04-13 ENCOUNTER — Telehealth: Payer: Self-pay | Admitting: *Deleted

## 2019-04-13 ENCOUNTER — Ambulatory Visit: Payer: Medicare Other

## 2019-04-13 ENCOUNTER — Other Ambulatory Visit: Payer: Self-pay | Admitting: *Deleted

## 2019-04-13 MED ORDER — GABAPENTIN 300 MG PO CAPS: 300.0000 mg | ORAL_CAPSULE | Freq: Every day | ORAL | 0 refills | Status: DC

## 2019-04-13 NOTE — Telephone Encounter (Signed)
Received request for Gabapentin capsule from [facility] mail order. They specified in request that any prescription that are too soon to fill will not be filled or shipped at this time and will be kept on file for the patient until the next available fill date. Order # [id number].   Ok to refill per Dr. Jaynee Eagles. Refill e-scribed to [facility]. I also called [facility] and spoke with [facility]. She stated pt last filled 90 day supply of gabapentin on 02/27/19.

## 2019-04-13 NOTE — Telephone Encounter (Signed)
We received a refill request for gabapentin. 3 month supply was ordered 02/22/19. Refill requested too soon. Faxed denial back [facility]. Received a receipt of confirmation.

## 2019-04-21 ENCOUNTER — Ambulatory Visit
Admission: RE | Admit: 2019-04-21 | Discharge: 2019-04-21 | Disposition: A | Discharge status: Home / Self Care | Payer: Medicare Other | Source: Ambulatory Visit | Attending: Nurse Practitioner | Admitting: Nurse Practitioner

## 2019-04-21 DIAGNOSIS — K7469 Other cirrhosis of liver: Secondary | ICD-10-CM

## 2019-05-02 ENCOUNTER — Ambulatory Visit: Payer: Medicare Other | Attending: Internal Medicine

## 2019-05-02 DIAGNOSIS — Z23 Encounter for immunization: Secondary | ICD-10-CM

## 2019-05-02 NOTE — Progress Notes (Signed)
   Covid-19 Vaccination Clinic  Name:  Ravan Wolsky    MRN: [id number] DOB: 1955-11-27  05/02/2019  Ms. Muldrow was observed post Covid-19 immunization for 15 minutes without incident. She was provided with Vaccine Information Sheet and instruction to access the V-Safe system.   Ms. Delis was instructed to call 911 with any severe reactions post vaccine: [name] Difficulty breathing  . Swelling of face and throat  . A fast heartbeat  . A bad rash all over body  . Dizziness and weakness   Immunizations Administered    Name Date Dose VIS Date Route   Pfizer COVID-19 Vaccine 05/02/2019 11:01 AM 0.3 mL 01/20/2019 Intramuscular   Manufacturer: [place]   Lot: [id number]   [place]: [id number]

## 2019-05-16 NOTE — Telephone Encounter (Signed)
Results from [facility]- comprehensive neuropathies panel & Transthyretin Amyloidosis Test: Negative.

## 2019-05-16 NOTE — Progress Notes (Signed)
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 She is taking gabapentin as well as hydrocodone for her foot pain. She does not have side effects to high doses of gabapentin so we can increase it and have her take it 3x a day. She denies any idney issues, says her kidney tests have been normal and recently had lab tests. We will send a reflex CMT panel via [facility].  HPI:  Tammy [name] is a 64 y.o. female here as requested by [name], MD for neuropathy. PMHx HTN, CVA and left-sided weakness, hyperkalemia, anxiety, bipolar disorder, chronic pain syndrome, nicotine dependence. Symptoms started about a year. Numbness in the toes and now the feet. But she has soreness the back of the thighs. She wonders if it is circulation issues. She has cramping in her legs. Numbness in feet and toes bother her. She had injury to the back years ago. She has low back pain, she has pinched disks and this is why she left work because of back problems. The back hurts midline. She is a smoker. She is on gabapentin and helping. Worse in the mornings with back stiffness and prox LE cramping. Gabapentin helps. No symptoms in the hands or the upper body. She feels she is not exercising as much, no focal weakness. Residual left-sided weakness and sensory changes since stroke.   Reviewed notes, labs and imaging from outside physicians, which showed:  Reviewed report from 2014 MRI/MRA brain/head IMPRESSION: MRI HEAD IMPRESSION  1. Acute right corona radiata to lentiform nuclei lacunar infarct. No mass effect or hemorrhage.  2. Underlying signal changes suggestive of chronic white matter small  vessel ischemia.  MRA HEAD IMPRESSION  Negative intracranial MRA.  hgba1c 5.9 05/11/2015   Review of Systems: Patient complains of symptoms per HPI as well as the following symptoms leg weakness, cramps, numbness. Pertinent negatives and positives per HPI. All others negative.   Social History   Socioeconomic History  . Marital status: Divorced    Spouse name: Not on file  . Number of children: 2  . Years of education: Not on file  . Highest education level: Not on file  Occupational History  . Occupation: diabled  Tobacco Use  . Smoking status: Current Every Day Smoker    Packs/day: 0.50    Years: 40.00    Pack years: 20.00    Types: Cigarettes  . Smokeless tobacco: Never Used  . Tobacco comment: has used e-cigarettes, no vaping   Substance and Sexual Activity  . Alcohol use: Not Currently    Alcohol/week: 0.0 standard drinks    Comment: 1 per week  . Drug use: No  . Sexual activity: Not on file  Other Topics Concern  . Not on file  Social History Narrative   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   Caffeine: 1 cup in the morning    Social Determinants of Health   Financial Resource Strain:   . Difficulty of Paying Living Expenses:   Food Insecurity:   . Worried About Charity fundraiser in the Last Year:   . Arboriculturist in the Last Year:   [employer]  Needs:   . Lack of Transportation (Medical):   [name] Lack of Transportation (Non-Medical):   Physical Activity:   . Days of Exercise per Week:   . Minutes of Exercise per Session:   Stress:   . Feeling of Stress :   Social Connections:   . Frequency of Communication with Friends and Family:   . Frequency of Social Gatherings with Friends and Family:   . Attends Religious Services:   . Active Member of Clubs or Organizations:   . Attends Archivist Meetings:   [name] Marital Status:   Intimate Partner Violence:   . Fear of Current or Ex-Partner:   . Emotionally Abused:   [name]  Physically Abused:   . Sexually Abused:     Family History  Problem Relation Age of Onset  . Breast cancer Mother   . Other Mother        blood clots  . Diabetes Brother   . Neuropathy Brother        diabetic neuropathy  . Diabetes Sister   . Neuropathy Sister        diabetic neuropathy  . Diabetes Paternal Aunt   . Breast cancer Maternal Aunt   . Breast cancer Paternal Aunt   . Colon cancer Paternal Uncle     Past Medical History:  Diagnosis Date  . Arthritis   . Bipolar disorder ([place])   . Depression   . GERD (gastroesophageal reflux disease)   . HTN (hypertension)   . Stroke [facility]) 09/2012   slurred speech and left sided weakness/notes 10/04/2012    Patient Active Problem List   Diagnosis Date Noted  . 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 Chronic pain syndrome 10/06/2012  . Nicotine dependence 10/06/2012  . Anxiety state, unspecified 10/05/2012  . Bipolar disorder, unspecified ([place]) 10/05/2012  . Slurred speech 10/04/2012  . Left-sided weakness 10/04/2012  . Hyperkalemia 10/04/2012  . Acute CVA (cerebrovascular accident) ([place]) 10/04/2012  . HTN (hypertension)     Past Surgical History:  Procedure Laterality Date  . CESAREAN SECTION  1986  . fracture of leg     2 months ago    Current Outpatient Medications  Medication Sig Dispense Refill  . Acetaminophen (TYLENOL PO) Take by mouth as needed.    [name] amLODipine (NORVASC) 10 MG tablet Take 10 mg by mouth daily.    [name] atenolol (TENORMIN) 25 MG tablet Take 25 mg by mouth daily.    . hydrochlorothiazide (HYDRODIURIL) 25 MG tablet Take 25 mg by mouth daily.    [name] HYDROcodone-acetaminophen (NORCO/VICODIN) 5-325 MG tablet Take 1 tablet by mouth 3 (three) times daily as needed.    . IBUPROFEN PO Take by mouth as needed.    [name] lisinopril (ZESTRIL) 20 MG tablet Take 20 mg by mouth daily.    . Multiple Vitamin (MULTIVITAMIN PO) Take by mouth daily.    . pantoprazole (PROTONIX) 40 MG tablet Take 40 mg by mouth  daily.    [name] gabapentin (NEURONTIN) 600 MG tablet Take 1-2 tablets (600-1,200 mg total) by mouth 3 (three) times daily. 540 tablet 3   No current facility-administered medications for this visit.    Allergies as of 05/17/2019  . (No Known Allergies)    Vitals: BP 138/78 (BP Location: Left Arm, Patient Position: Sitting)   Pulse 76   Temp (!) 96.9 F (36.1 C) Comment: taken at front  Ht 5' 2.5" (1.588 m)   Wt 157 lb (71.2 kg)   BMI 28.26 kg/m  Last Weight:  Wt Readings from Last 1 Encounters:  05/17/19 157 lb (71.2 kg)   Last Height:   Ht Readings from Last 1 Encounters:  05/17/19 5' 2.5" (1.588 m)     Physical exam: Exam: Gen: NAD, conversant, well nourised, well groomed                     CV: RRR, +SEM. No Carotid Bruits. No peripheral edema, warm, nontender Eyes: Conjunctivae clear without exudates or hemorrhage  Neuro: Detailed Neurologic Exam  Speech:    Speech is normal; fluent and spontaneous with normal comprehension.  Cognition:    The patient is oriented to person, place, and time;     recent and remote memory intact;     language fluent;     normal attention, concentration,     fund of knowledge Cranial Nerves:    The pupils are equal, round, and reactive to light. The fundi are normal and spontaneous venous pulsations are present. Visual fields are full to finger confrontation. Extraocular movements are intact. Trigeminal sensation is intact and the muscles of mastication are normal. The face is symmetric. The palate elevates in the midline. Hearing intact. Voice is normal. Shoulder shrug is normal. The tongue has normal motion without fasciculations.   Coordination:    Normal finger to nose   Gait:    Heel-toe and tandem gait with difficulty due to imbalance and weakness.   Motor Observation:    No asymmetry, no atrophy, and no involuntary movements noted. Flat feet, ankle weakness (familial) Tone:    Normal muscle tone.    Posture:    Posture  is normal, she may bend at the waist slightly due to low back pain    Strength: left prox hip flexion 4/5, left leg flexion 4/5. Distal foot dorsifexion and plantar flexion weakness (cannot heel/toe) otherwise Strength is V/V in the upper and lower limbs.      Sensation: decreased pin prick to the ankles. Intact vibration     Reflex Exam:  DTR's:   Absent AJs.  .   Toes:    The toes are downgoing bilaterally.   Clonus:    Clonus is absent.    Assessment/Plan:  2 64 year old with distal weakness and neuropathy in the feet.   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  Wonder if this is Charcot Marie Tooth.   CMT genetic panel - 80 panel was negative will try to get the CMT panel for her as a reflex no charge and also consider [facility], requested  Increase gabapentin to 1200mg  tid  No orders of the defined types were placed in this encounter.  Meds ordered this encounter  Medications  . gabapentin (NEURONTIN) 600 MG tablet    Sig: Take 1-2 tablets (600-1,200 mg total) by mouth 3 (three) times daily.    Dispense:  540 tablet    Refill:  3    Cc: [name], MD,    Sarina Ill, MD  [facility] [address] [place],  [address]  Phone [phone] Fax [phone]  I spent 25 minutes of face-to-face and non-face-to-face time with patient on the  1. Idiopathic progressive neuropathy    diagnosis.  This included previsit chart review, lab review, study review, order entry, electronic health  record documentation, patient education on the different diagnostic and therapeutic options, counseling and coordination of care, risks and benefits of management, compliance, or risk factor reduction

## 2019-05-17 ENCOUNTER — Encounter: Payer: Self-pay | Admitting: Neurology

## 2019-05-17 ENCOUNTER — Ambulatory Visit: Payer: Medicare Other | Admitting: Neurology

## 2019-05-17 ENCOUNTER — Other Ambulatory Visit: Payer: Self-pay

## 2019-05-17 VITALS — BP 138/78 | HR 76 | Temp 96.9°F | Ht 62.5 in | Wt 157.0 lb

## 2019-05-17 DIAGNOSIS — G603 Idiopathic progressive neuropathy: Secondary | ICD-10-CM

## 2019-05-17 MED ORDER — GABAPENTIN 600 MG PO TABS: 600.0000 mg | ORAL_TABLET | Freq: Three times a day (TID) | ORAL | 3 refills | Status: DC

## 2019-05-17 NOTE — Patient Instructions (Signed)
 Charcot-Marie-Tooth Disease, Adult  Charcot-Marie-Tooth disease (CMT) is a group of genetic nervous system diseases that cause the arms and legs to lose feeling and strength over time. The condition usually develops in childhood or early adolescence, but it may not be diagnosed until adulthood. CMT affects the peripheral nerves, which are the nerves outside the brain and spinal cord. There are several types of CMT. Over time, the arm and leg muscles may shrink (atrophy). Symptoms of CMT can range from mild to severe. What are the causes? This condition is caused by genes that are usually passed down through families (inherited). CMT is caused by mutations in the genes that affect the insulation around peripheral nerves (myelin). Over time, these mutations cause the myelin to break down, leading to numbness and weakness in the feet and hands. Rarely, a change in the genes (spontaneous mutation) can cause this condition even if there is no family history of it. What increases the risk? You may be at higher risk of CMT if one or both of your parents carry the gene for CMT. Some forms of CMT can develop from inheriting the gene from one parent, and other forms of CMT develop from inheriting the gene from both parents. What are the signs or symptoms? Symptoms of CMT may vary depending on the form of CMT you have. Symptoms start gradually and get worse over the course of years. Symptoms include:  Muscle weakness in the foot or lower leg.  Loss of feeling in the foot and lower leg.  Trouble walking or an unusual pace and stride (gait).  Frequent falls.  Deformity of the lower legs and feet, such as high arches.  Mild to severe pain. This is not common. As the disease progresses, it often affects other parts of the body. Later symptoms include:  Weakness in the hands.  Loss of fine motor skills (dexterity).  Loss of feeling in the hands. Rare types of CMT can lead to:  Hearing and vision  loss.  Shortness of breath.  Vocal cord weakness. How is this diagnosed? This condition may be diagnosed based on:  Your symptoms and a physical exam.  Your medical and family history.  An exam done by a health care provider who specializes in diseases of the nervous system (neurologist). You may also need to have tests, including:  Electromyography (EMG).  Nerve conduction studies.  Genetic testing. How is this treated? There is no cure for CMT. Treatment depends on the severity of your symptoms. Your team of health care providers will develop a treatment plan that is best for you. Treatment may include:  Exercises (physical therapy) to: ? Strengthen muscles. ? Reduce pain through stretching. ? Increase stamina. ? Prevent falls.  Occupational therapy to help you do your normal activities.  Assistive devices, such as shoe inserts (orthotics), ankle braces, and thumb splints.  Surgery to repair deformities in your feet or joints.  Medicine to relieve pain. Follow these instructions at home:  Lifestyle  Do any physical or occupational therapy exercises at home as instructed.  Use your assistive devices as needed to help with mobility and to prevent accidents and injuries.  Consider joining a support group for CMT disease. General instructions  Take over-the-counter and prescription medicines only as told by your health care provider.  Learn as much as you can about your condition and work closely with your team of health care providers.  Keep all follow-up visits as told by your health care provider. This is   important. Contact a health care provider if you:  Develop new or worsening symptoms.  Develop new wounds on your feet or legs.  Need more support at home.  Are falling.  Have hearing loss. Get help right away if you have:  Severe pain.  Shortness of breath.  Severe pain or injury after a fall. Summary  Charcot-Marie-Tooth disease (CMT) is a  group of genetic nervous system diseases that cause the arms and legs to lose feeling and strength over time. The condition usually develops in childhood or early adolescence.  CMT is caused by inheriting a genetic mutation.  Symptoms of CMT start gradually and get worse over the course of years. They can range from mild to severe.  There is no cure for CMT. Treatment may include physical therapy, braces, and pain management. This information is not intended to replace advice given to you by your health care provider. Make sure you discuss any questions you have with your health care provider. Document Revised: 05/11/2018 Document Reviewed: 05/11/2018 Elsevier Patient Education  2020 Elsevier Inc.  

## 2019-08-23 ENCOUNTER — Telehealth: Payer: Self-pay | Admitting: Neurology

## 2019-08-23 DIAGNOSIS — G603 Idiopathic progressive neuropathy: Secondary | ICD-10-CM

## 2019-08-23 MED ORDER — GABAPENTIN 300 MG PO CAPS: 300.0000 mg | ORAL_CAPSULE | Freq: Two times a day (BID) | ORAL | 2 refills | Status: DC

## 2019-08-23 NOTE — Telephone Encounter (Signed)
Spoke with the pt. She had Gabapentin capsules left so she just started taking the tablets about 4 weeks ago. Approx 2 weeks ago she noticed the swelling of lips and the nausea. She backed off her dose and noticed that the swelling was better when she woke up and she also noticed that she didn't need as high of a dose for control of her neuropathy. She is taking 600 mg twice a day. She wasn't sure if the tablets have some sort of ingredient that caused this reaction so she would like to go back to the capsules. She has made no other changes. She didn't know of any possible food allergy it could have been either. Pt verbalized appreciation for the call.

## 2019-08-23 NOTE — Telephone Encounter (Signed)
I e-scribed Gabapentin 300 mg capsules Take 300-600 mg twice daily per v.o. from Dr. Jaynee Eagles along with note to cancel Gabapentin 600 mg. I inadvertently sent to [facility] so I called and canceled this and sent the Rx to [facility] mail order pharmacy. I also called the pt and let her know the prescription was sent in and we reviewed the directions.  I also advised pt that if her reaction/swelling worsens, starts having swelling in throat, breathing problems, etc to call 911. She verbalized understanding and appreciation.

## 2019-08-23 NOTE — Addendum Note (Signed)
Addended by: Gildardo Griffes on: 08/23/2019 03:19 PM   Modules accepted: Orders

## 2019-08-23 NOTE — Telephone Encounter (Signed)
Pt has called to report she is having a reaction to her gabapentin (NEURONTIN) 600 MG tablet. Pt states she is having nausea  And lip swelling.  Please call

## 2019-08-23 NOTE — Telephone Encounter (Signed)
Spoke with Dr. Jaynee Eagles about this. She said to cancel the Gabapentin tablets and switch back to Gabapentin 300 mg capsules prescribing 1-2 capsules twice daily.

## 2019-09-26 ENCOUNTER — Other Ambulatory Visit: Payer: Self-pay | Admitting: Nurse Practitioner

## 2019-09-26 DIAGNOSIS — K7469 Other cirrhosis of liver: Secondary | ICD-10-CM

## 2019-09-27 ENCOUNTER — Ambulatory Visit
Admission: RE | Admit: 2019-09-27 | Discharge: 2019-09-27 | Disposition: A | Discharge status: Home / Self Care | Payer: Medicare Other | Source: Ambulatory Visit | Attending: Nurse Practitioner | Admitting: Nurse Practitioner

## 2019-09-27 DIAGNOSIS — K7469 Other cirrhosis of liver: Secondary | ICD-10-CM

## 2020-01-08 ENCOUNTER — Ambulatory Visit: Payer: Medicare Other

## 2020-01-09 ENCOUNTER — Ambulatory Visit: Payer: Medicare Other | Attending: Internal Medicine

## 2020-01-09 DIAGNOSIS — Z23 Encounter for immunization: Secondary | ICD-10-CM

## 2020-01-09 NOTE — Progress Notes (Signed)
° °  Covid-19 Vaccination Clinic  Name:  Makennah Omura    MRN: [id number] DOB: 1955/09/07  01/09/2020  Ms. Irigoyen was observed post Covid-19 immunization for 15 minutes without incident. She was provided with Vaccine Information Sheet and instruction to access the V-Safe system.   Ms. Mclaurin was instructed to call 911 with any severe reactions post vaccine:  Difficulty breathing   Swelling of face and throat   A fast heartbeat   A bad rash all over body   Dizziness and weakness   Immunizations Administered    Name Date Dose VIS Date Route   Pfizer COVID-19 Vaccine 01/09/2020  4:04 PM 0.3 mL 11/29/2019 Intramuscular   Manufacturer: [place]   Lot: [id number]   [place]: [id number]

## 2020-02-10 DIAGNOSIS — C50919 Malignant neoplasm of unspecified site of unspecified female breast: Secondary | ICD-10-CM

## 2020-02-10 HISTORY — DX: Malignant neoplasm of unspecified site of unspecified female breast: C50.919

## 2020-03-26 ENCOUNTER — Other Ambulatory Visit: Payer: Self-pay | Admitting: Internal Medicine

## 2020-03-26 DIAGNOSIS — Z1231 Encounter for screening mammogram for malignant neoplasm of breast: Secondary | ICD-10-CM

## 2020-04-03 ENCOUNTER — Other Ambulatory Visit: Payer: Self-pay | Admitting: Nurse Practitioner

## 2020-04-03 DIAGNOSIS — K7460 Unspecified cirrhosis of liver: Secondary | ICD-10-CM

## 2020-04-05 ENCOUNTER — Ambulatory Visit
Admission: RE | Admit: 2020-04-05 | Discharge: 2020-04-05 | Disposition: A | Discharge status: Home / Self Care | Payer: Medicare Other | Source: Ambulatory Visit | Attending: Nurse Practitioner | Admitting: Nurse Practitioner

## 2020-04-05 DIAGNOSIS — K7460 Unspecified cirrhosis of liver: Secondary | ICD-10-CM

## 2020-04-09 ENCOUNTER — Inpatient Hospital Stay: Admission: RE | Admit: 2020-04-09 | Payer: Medicare Other | Source: Ambulatory Visit

## 2020-05-21 ENCOUNTER — Other Ambulatory Visit: Payer: Self-pay | Admitting: Neurology

## 2020-05-21 DIAGNOSIS — G603 Idiopathic progressive neuropathy: Secondary | ICD-10-CM

## 2020-06-05 ENCOUNTER — Ambulatory Visit
Admission: RE | Admit: 2020-06-05 | Discharge: 2020-06-05 | Disposition: A | Discharge status: Home / Self Care | Payer: Medicare Other | Source: Ambulatory Visit | Attending: Internal Medicine | Admitting: Internal Medicine

## 2020-06-05 ENCOUNTER — Other Ambulatory Visit: Payer: Self-pay

## 2020-06-05 DIAGNOSIS — Z1231 Encounter for screening mammogram for malignant neoplasm of breast: Secondary | ICD-10-CM

## 2020-06-07 ENCOUNTER — Other Ambulatory Visit: Payer: Self-pay | Admitting: Internal Medicine

## 2020-06-07 DIAGNOSIS — R928 Other abnormal and inconclusive findings on diagnostic imaging of breast: Secondary | ICD-10-CM

## 2020-06-27 ENCOUNTER — Other Ambulatory Visit: Payer: Self-pay

## 2020-06-27 ENCOUNTER — Ambulatory Visit
Admission: RE | Admit: 2020-06-27 | Discharge: 2020-06-27 | Disposition: A | Discharge status: Home / Self Care | Payer: Medicare Other | Source: Ambulatory Visit | Attending: Internal Medicine | Admitting: Internal Medicine

## 2020-06-27 ENCOUNTER — Other Ambulatory Visit: Payer: Self-pay | Admitting: Internal Medicine

## 2020-06-27 DIAGNOSIS — R928 Other abnormal and inconclusive findings on diagnostic imaging of breast: Secondary | ICD-10-CM

## 2020-07-02 ENCOUNTER — Ambulatory Visit
Admission: RE | Admit: 2020-07-02 | Discharge: 2020-07-02 | Disposition: A | Discharge status: Home / Self Care | Payer: Medicare Other | Source: Ambulatory Visit | Attending: Internal Medicine | Admitting: Internal Medicine

## 2020-07-02 ENCOUNTER — Other Ambulatory Visit: Payer: Self-pay

## 2020-07-02 DIAGNOSIS — R928 Other abnormal and inconclusive findings on diagnostic imaging of breast: Secondary | ICD-10-CM

## 2020-07-03 ENCOUNTER — Telehealth: Payer: Self-pay | Admitting: *Deleted

## 2020-07-03 ENCOUNTER — Encounter: Payer: Self-pay | Admitting: *Deleted

## 2020-07-03 NOTE — Telephone Encounter (Signed)
Confirmed [facility] for 07/10/20 at 1215 .  Instructions and contact information given.

## 2020-07-09 ENCOUNTER — Encounter: Payer: Self-pay | Admitting: *Deleted

## 2020-07-09 DIAGNOSIS — C50412 Malignant neoplasm of upper-outer quadrant of left female breast: Secondary | ICD-10-CM | POA: Insufficient documentation

## 2020-07-09 DIAGNOSIS — Z170 Estrogen receptor positive status [ER+]: Secondary | ICD-10-CM

## 2020-07-10 ENCOUNTER — Inpatient Hospital Stay (HOSPITAL_BASED_OUTPATIENT_CLINIC_OR_DEPARTMENT_OTHER): Payer: Medicare Other | Admitting: Genetic Counselor

## 2020-07-10 ENCOUNTER — Encounter: Payer: Self-pay | Admitting: Physical Therapy

## 2020-07-10 ENCOUNTER — Other Ambulatory Visit: Payer: Self-pay | Admitting: *Deleted

## 2020-07-10 ENCOUNTER — Encounter: Payer: Self-pay | Admitting: Oncology

## 2020-07-10 ENCOUNTER — Inpatient Hospital Stay: Payer: Medicare Other

## 2020-07-10 ENCOUNTER — Other Ambulatory Visit: Payer: Self-pay | Admitting: Surgery

## 2020-07-10 ENCOUNTER — Other Ambulatory Visit: Payer: Self-pay

## 2020-07-10 ENCOUNTER — Encounter: Payer: Self-pay | Admitting: Genetic Counselor

## 2020-07-10 ENCOUNTER — Ambulatory Visit
Admission: RE | Admit: 2020-07-10 | Discharge: 2020-07-10 | Disposition: A | Discharge status: Home / Self Care | Payer: Medicare Other | Source: Ambulatory Visit | Attending: Radiation Oncology | Admitting: Radiation Oncology

## 2020-07-10 ENCOUNTER — Ambulatory Visit: Payer: Medicare Other | Attending: Surgery | Admitting: Physical Therapy

## 2020-07-10 ENCOUNTER — Encounter: Payer: Self-pay | Admitting: *Deleted

## 2020-07-10 ENCOUNTER — Inpatient Hospital Stay: Payer: Medicare Other | Attending: Oncology | Admitting: Oncology

## 2020-07-10 VITALS — BP 197/86 | HR 69 | Temp 97.5°F | Resp 18 | Ht 62.5 in | Wt 161.3 lb

## 2020-07-10 DIAGNOSIS — Z170 Estrogen receptor positive status [ER+]: Secondary | ICD-10-CM

## 2020-07-10 DIAGNOSIS — Z79899 Other long term (current) drug therapy: Secondary | ICD-10-CM | POA: Diagnosis not present

## 2020-07-10 DIAGNOSIS — Z8041 Family history of malignant neoplasm of ovary: Secondary | ICD-10-CM

## 2020-07-10 DIAGNOSIS — I10 Essential (primary) hypertension: Secondary | ICD-10-CM | POA: Diagnosis not present

## 2020-07-10 DIAGNOSIS — Z87891 Personal history of nicotine dependence: Secondary | ICD-10-CM | POA: Insufficient documentation

## 2020-07-10 DIAGNOSIS — C50412 Malignant neoplasm of upper-outer quadrant of left female breast: Secondary | ICD-10-CM

## 2020-07-10 DIAGNOSIS — R293 Abnormal posture: Secondary | ICD-10-CM | POA: Diagnosis present

## 2020-07-10 DIAGNOSIS — Z853 Personal history of malignant neoplasm of breast: Secondary | ICD-10-CM

## 2020-07-10 DIAGNOSIS — Z803 Family history of malignant neoplasm of breast: Secondary | ICD-10-CM

## 2020-07-10 DIAGNOSIS — Z800 Family history of malignant neoplasm of digestive organs: Secondary | ICD-10-CM | POA: Diagnosis not present

## 2020-07-10 HISTORY — PX: BREAST LUMPECTOMY: SHX2

## 2020-07-10 HISTORY — DX: Family history of malignant neoplasm of breast: Z80.3

## 2020-07-10 HISTORY — DX: Family history of malignant neoplasm of digestive organs: Z80.0

## 2020-07-10 HISTORY — DX: Family history of malignant neoplasm of ovary: Z80.41

## 2020-07-10 LAB — CMP (CANCER CENTER ONLY)
ALT: 14 U/L (ref 0–44)
AST: 16 U/L (ref 15–41)
Albumin: 3.7 g/dL (ref 3.5–5.0)
Alkaline Phosphatase: 103 U/L (ref 38–126)
Anion gap: 11 (ref 5–15)
BUN: 13 mg/dL (ref 8–23)
CO2: 26 mmol/L (ref 22–32)
Calcium: 9.5 mg/dL (ref 8.9–10.3)
Chloride: 105 mmol/L (ref 98–111)
Creatinine: 0.76 mg/dL (ref 0.44–1.00)
GFR, Estimated: >60 mL/min (ref >60)
Glucose, Bld: 107 mg/dL — ABNORMAL HIGH (ref 70–99)
Potassium: 4.1 mmol/L (ref 3.5–5.1)
Sodium: 142 mmol/L (ref 135–145)
Total Bilirubin: 0.3 mg/dL (ref 0.3–1.2)
Total Protein: 7.5 g/dL (ref 6.5–8.1)

## 2020-07-10 LAB — CBC WITH DIFFERENTIAL (CANCER CENTER ONLY)
Abs Immature Granulocytes: 0.01 10*3/uL (ref 0.00–0.07)
Basophils Absolute: 0 10*3/uL (ref 0.0–0.1)
Basophils Relative: 0 %
Eosinophils Absolute: 0.2 10*3/uL (ref 0.0–0.5)
Eosinophils Relative: 3 %
HCT: 35.7 % — ABNORMAL LOW (ref 36.0–46.0)
Hemoglobin: 11.4 g/dL — ABNORMAL LOW (ref 12.0–15.0)
Immature Granulocytes: 0 %
Lymphocytes Relative: 28 %
Lymphs Abs: 1.6 10*3/uL (ref 0.7–4.0)
MCH: 27.8 pg (ref 26.0–34.0)
MCHC: 31.9 g/dL (ref 30.0–36.0)
MCV: 87.1 fL (ref 80.0–100.0)
Monocytes Absolute: 0.3 10*3/uL (ref 0.1–1.0)
Monocytes Relative: 5 %
Neutro Abs: 3.7 10*3/uL (ref 1.7–7.7)
Neutrophils Relative %: 64 %
Platelet Count: 213 10*3/uL (ref 150–400)
RBC: 4.1 MIL/uL (ref 3.87–5.11)
RDW: 15.3 % (ref 11.5–15.5)
WBC Count: 5.9 10*3/uL (ref 4.0–10.5)
nRBC: 0 % (ref 0.0–0.2)

## 2020-07-10 LAB — GENETIC SCREENING ORDER

## 2020-07-10 NOTE — Patient Instructions (Signed)

## 2020-07-10 NOTE — Progress Notes (Signed)
[place]  [phone] [phone]     ID: Tammy [name] DOB: Mar 22, 1955  MR#: [id number]  HTD#:[id number]  Patient Care Team: [name], MD as PCP - General (Internal Medicine) [name], RN as Oncology Nurse Navigator [name], RN as Oncology Nurse Navigator [name], MD as Consulting Physician (General Surgery) Amna Welker, [name], MD as Consulting Physician (Oncology) [name], MD as Consulting Physician (Radiation Oncology) [name], MD as Consulting Physician (Neurology) [name], MD OTHER MD:  CHIEF COMPLAINT: Estrogen receptor positive breast cancer  CURRENT TREATMENT: Awaiting definitive surgery   HISTORY OF CURRENT ILLNESS: Tammy [name] had routine screening mammography on 06/05/2020 showing a possible abnormality in the left breast. She underwent left diagnostic mammography with tomography and left breast ultrasonography at [facility] on 06/27/2020 showing: breast density category C; 9 mm left breast mass at 1 o'clock; no enlarged or morphologically abnormal lymph nodes.  Accordingly on 07/02/2020 she proceeded to biopsy of the left breast area in question. The pathology from this procedure ([id number]) showed: invasive ductal carcinoma, grade 1; ductal carcinoma in situ. Prognostic indicators significant for: estrogen receptor, 100% positive and progesterone receptor, 5% positive, both with strong staining intensity. Proliferation marker Ki67 at 5%. HER2 equivocal by immunohistochemistry (2+), but negative by fluorescent in situ hybridization with a signals ratio 1.24 and number per cell 1.8.  Cancer Staging Malignant neoplasm of upper-outer quadrant of left breast in female, estrogen receptor positive ([place]) Staging form: Breast, AJCC 8th Edition - Clinical stage from 07/10/2020: Stage IA (cT1b, cN0, cM0, G1, ER+, PR+, HER2-) - Signed by [name], MD on  07/10/2020 Stage prefix: Initial diagnosis Histologic grading system: 3 grade system   The patient's subsequent history is as detailed below.   INTERVAL HISTORY: Tammy [name] was evaluated in the multidisciplinary breast cancer clinic on 07/10/2020 accompanied by her sister.  Her daughter Tammy [name] also participated by video conference. Her case was also presented at the multidisciplinary breast cancer conference on the same day. At that time a preliminary plan was proposed: Genetics testing, breast conserving surgery, Oncotype, adjuvant radiation and antiestrogens   REVIEW OF SYSTEMS: There were no specific symptoms leading to the original mammogram, which was routinely scheduled.  The patient does have a history of stroke and sometimes she has a poor grip on the right.  Sometimes her right leg "catches".  Otherwise she has few residuals.  The patient denies unusual headaches, visual changes, nausea, vomiting, stiff neck, dizziness, or gait imbalance.  She does have early cataracts and some reflux symptoms.  There has been no cough, phlegm production, or pleurisy, no chest pain or pressure, and no change in bowel or bladder habits. The patient denies fever, rash, bleeding, unexplained fatigue or unexplained weight loss. A detailed review of systems was otherwise entirely negative.   COVID 19 VACCINATION STATUS: fully vaccinated [employer]), with booster 12/2019   PAST MEDICAL HISTORY: Past Medical History:  Diagnosis Date  . Arthritis   . Bipolar disorder ([place])   . Breast cancer ([place])   . Depression   . GERD (gastroesophageal reflux disease)   . HTN (hypertension)   . Stroke [facility]) 09/2012   slurred speech and left sided weakness/notes 10/04/2012    PAST SURGICAL HISTORY: Past Surgical History:  Procedure Laterality Date  . CESAREAN SECTION  1986  . fracture of leg     2 months ago    FAMILY HISTORY: Family History  Problem Relation Age  of Onset  . Breast cancer Mother   . Other Mother         blood clots  . Diabetes Brother   . Neuropathy Brother        diabetic neuropathy  . Diabetes Sister   . Neuropathy Sister        diabetic neuropathy  . Breast cancer Sister   . Diabetes Paternal Aunt   . Breast cancer Maternal Aunt   . Breast cancer Paternal Aunt   . Colon cancer Paternal Uncle   The patient's father died from rhabdomyolysis at the age of 66.  The patient's mother died from congestive heart failure at the age of 41.  The patient has 3 brothers, 2 sisters, with no known history of cancer although one of her sisters is currently being evaluated by Dr. Burr Medico.  There are is 1 paternal aunt with breast cancer 1 maternal aunt with breast cancer and 1 maternal aunt with colon cancer all over the age of 32 at diagnosis   GYNECOLOGIC HISTORY:  No LMP recorded. Patient is postmenopausal. Menarche: 65 years old Age at first live birth: 65 years old [place] P 1 LMP 52 Contraceptive oral contraceptives approximately 10 years with no complications HRT no  Hysterectomy? no BSO? 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  She is disabled secondary to her stroke.  She is divorced.  At home she lives with her [place] who is a retired Engineering geologist.  They have 2 cats.  The patient has an adopted daughter, Tammy [name], 49, who has a Oceanographer in social work and lives in [place], and daughter Tammy [name], 35, lives in [place] and is a Librarian, academic for the call a nurse system.  The patient has 2 grandchildren, 1 of whom is a Higher education careers adviser in [place], and 1 great granddaughter.  She attends a local [employer] church    ADVANCED DIRECTIVES: Not in place.  At the 07/10/2020 visit the patient was given the appropriate documents to complete and notarized at her discretion.  She tells me she intends to name both her daughters as healthcare power of attorney   HEALTH MAINTENANCE: Social History   Tobacco Use  .  Smoking status: Former Smoker    Packs/day: 0.50    Years: 40.00    Pack years: 20.00    Types: Cigarettes  . Smokeless tobacco: Never Used  . Tobacco comment: has used e-cigarettes, no vaping   Vaping Use  . Vaping Use: Never used  Substance Use Topics  . Alcohol use: Not Currently    Alcohol/week: 0.0 standard drinks    Comment: 1 per week  . Drug use: No     Colonoscopy: Never  PAP: 2017  Bone density: Never   No Known Allergies  Current Outpatient Medications  Medication Sig Dispense Refill  . Acetaminophen (TYLENOL PO) Take by mouth as needed.    [name] amLODipine (NORVASC) 10 MG tablet Take 10 mg by mouth daily.    [name] atenolol (TENORMIN) 25 MG tablet Take 25 mg by mouth daily.    [name] gabapentin (NEURONTIN) 300 MG capsule Take 1-2 capsules (300-600 mg total) by mouth 2 (two) times daily. 360 capsule 2  . hydrochlorothiazide (HYDRODIURIL) 25 MG tablet Take 25 mg by mouth daily.    [name] HYDROcodone-acetaminophen (NORCO/VICODIN) 5-325 MG tablet Take 1 tablet by mouth 3 (three) times daily as needed.    . IBUPROFEN PO  Take by mouth as needed.    [name] lisinopril (ZESTRIL) 20 MG tablet Take 20 mg by mouth daily.    . Multiple Vitamin (MULTIVITAMIN PO) Take by mouth daily.    . pantoprazole (PROTONIX) 40 MG tablet Take 40 mg by mouth daily.     No current facility-administered medications for this visit.    OBJECTIVE: African-American woman in no acute distress  Vitals:   07/10/20 1306  BP: (!) 197/86  Pulse: 69  Resp: 18  Temp: (!) 97.5 F (36.4 C)  SpO2: 100%     Body mass index is 29.03 kg/m.   Wt Readings from Last 3 Encounters:  07/10/20 161 lb 4.8 oz (73.2 kg)  05/17/19 157 lb (71.2 kg)  12/01/18 153 lb (69.4 kg)      ECOG FS:1 - Symptomatic but completely ambulatory  Ocular: Sclerae unicteric, bilateral arcus senilis Ear-nose-throat: Wearing a mask Lymphatic: No cervical or supraclavicular adenopathy Lungs no rales or rhonchi Heart regular rate and rhythm Abd  soft, nontender, positive bowel sounds MSK no focal spinal tenderness, no joint edema Neuro: non-focal, well-oriented, appropriate affect Breasts: The right breast is unremarkable.  The left breast is status post recent biopsy.  There is a mild ecchymosis.  Both axillae are benign.   LAB RESULTS:  CMP     Component Value Date/Time   NA 142 07/10/2020 1220   K 4.1 07/10/2020 1220   CL 105 07/10/2020 1220   CO2 26 07/10/2020 1220   GLUCOSE 107 (H) 07/10/2020 1220   BUN 13 07/10/2020 1220   CREATININE 0.76 07/10/2020 1220   CALCIUM 9.5 07/10/2020 1220   PROT 7.5 07/10/2020 1220   PROT 6.8 12/01/2018 1511   ALBUMIN 3.7 07/10/2020 1220   AST 16 07/10/2020 1220   ALT 14 07/10/2020 1220   ALKPHOS 103 07/10/2020 1220   BILITOT 0.3 07/10/2020 1220   GFRNONAA >60 07/10/2020 1220   GFRAA >90 10/04/2012 1310    No results found for: TOTALPROTELP, ALBUMINELP, A1GS, A2GS, BETS, BETA2SER, GAMS, MSPIKE, SPEI  Lab Results  Component Value Date   WBC 5.9 07/10/2020   NEUTROABS 3.7 07/10/2020   HGB 11.4 (L) 07/10/2020   HCT 35.7 (L) 07/10/2020   MCV 87.1 07/10/2020   PLT 213 07/10/2020    No results found for: LABCA2  No components found for: FXOVAN191  No results for input(s): INR in the last 168 hours.  No results found for: LABCA2  No results found for: YOM600  No results found for: KHT977  No results found for: SFS239  No results found for: CA2729  No components found for: HGQUANT  No results found for: CEA1 / No results found for: CEA1   No results found for: AFPTUMOR  No results found for: CHROMOGRNA  No results found for: KPAFRELGTCHN, LAMBDASER, KAPLAMBRATIO (kappa/lambda light chains)  No results found for: HGBA, HGBA2QUANT, HGBFQUANT, HGBSQUAN (Hemoglobinopathy evaluation)   No results found for: LDH  No results found for: IRON, TIBC, IRONPCTSAT (Iron and TIBC)  No results found for: FERRITIN  Urinalysis    Component Value Date/Time    COLORURINE YELLOW 12/07/2007 0940   APPEARANCEUR CLEAR 12/07/2007 0940   LABSPEC 1.016 12/07/2007 0940   PHURINE 6.5 12/07/2007 0940   GLUCOSEU NEGATIVE 12/07/2007 0940   HGBUR NEGATIVE 12/07/2007 0940   BILIRUBINUR NEGATIVE 12/07/2007 0940   KETONESUR NEGATIVE 12/07/2007 0940   PROTEINUR 30 (A) 12/07/2007 0940   UROBILINOGEN 0.2 12/07/2007 0940   NITRITE NEGATIVE 12/07/2007 0940   LEUKOCYTESUR TRACE (  A) 12/07/2007 0940     STUDIES: US BREAST LTD UNI LEFT INC AXILLA  Result Date: 06/27/2020 CLINICAL DATA:  Patient returns today to evaluate a possible LEFT breast mass identified on recent screening EXAM: DIGITAL DIAGNOSTIC UNILATERAL LEFT MAMMOGRAM WITH TOMOSYNTHESIS AND CAD; ULTRASOUND LEFT BREAST LIMITED TECHNIQUE: Left digital diagnostic mammography and breast tomosynthesis was performed. The images were evaluated with computer-aided detection.; Targeted ultrasound examination of the left breast was performed COMPARISON:  Previous exams including recent screening mammogram dated 06/05/2020. ACR Breast Density Category c: The breast tissue is heterogeneously dense, which may obscure small masses. FINDINGS: LEFT breast diagnostic mammogram: On today's additional diagnostic views, there is a spiculated mass within the outer LEFT breast, 1-2 o'clock axis region, with associated architectural distortion, measuring approximately 4 mm greatest dimension. Targeted ultrasound is performed, showing a spiculated mass in the LEFT breast at the 1 o'clock axis, 7 cm from the nipple, measuring 9 x 6 x 7 mm, corresponding to the mammographic finding. LEFT axilla was evaluated with ultrasound showing no enlarged or morphologically abnormal lymph nodes. IMPRESSION: Highly suspicious mass within the LEFT breast at the 1 o'clock axis, 7 cm from the nipple, measuring 9 mm, corresponding to the mammographic finding. Ultrasound-guided biopsy is recommended. RECOMMENDATION: Ultrasound-guided biopsy of the LEFT breast  mass at the 1 o'clock axis. Ultrasound-guided biopsy is scheduled for May 24th. I have discussed the findings and recommendations with the patient. If applicable, a reminder letter will be sent to the patient regarding the next appointment. BI-RADS CATEGORY  5: Highly suggestive of malignancy. Electronically Signed   By: Franki Cabot M.D.   On: 06/27/2020 13:17   MM DIAG BREAST TOMO UNI LEFT  Result Date: 06/27/2020 CLINICAL DATA:  Patient returns today to evaluate a possible LEFT breast mass identified on recent screening EXAM: DIGITAL DIAGNOSTIC UNILATERAL LEFT MAMMOGRAM WITH TOMOSYNTHESIS AND CAD; ULTRASOUND LEFT BREAST LIMITED TECHNIQUE: Left digital diagnostic mammography and breast tomosynthesis was performed. The images were evaluated with computer-aided detection.; Targeted ultrasound examination of the left breast was performed COMPARISON:  Previous exams including recent screening mammogram dated 06/05/2020. ACR Breast Density Category c: The breast tissue is heterogeneously dense, which may obscure small masses. FINDINGS: LEFT breast diagnostic mammogram: On today's additional diagnostic views, there is a spiculated mass within the outer LEFT breast, 1-2 o'clock axis region, with associated architectural distortion, measuring approximately 4 mm greatest dimension. Targeted ultrasound is performed, showing a spiculated mass in the LEFT breast at the 1 o'clock axis, 7 cm from the nipple, measuring 9 x 6 x 7 mm, corresponding to the mammographic finding. LEFT axilla was evaluated with ultrasound showing no enlarged or morphologically abnormal lymph nodes. IMPRESSION: Highly suspicious mass within the LEFT breast at the 1 o'clock axis, 7 cm from the nipple, measuring 9 mm, corresponding to the mammographic finding. Ultrasound-guided biopsy is recommended. RECOMMENDATION: Ultrasound-guided biopsy of the LEFT breast mass at the 1 o'clock axis. Ultrasound-guided biopsy is scheduled for May 24th. I have  discussed the findings and recommendations with the patient. If applicable, a reminder letter will be sent to the patient regarding the next appointment. BI-RADS CATEGORY  5: Highly suggestive of malignancy. Electronically Signed   By: Franki Cabot M.D.   On: 06/27/2020 13:17   MM CLIP PLACEMENT LEFT  Result Date: 07/02/2020 CLINICAL DATA:  Patient status post ultrasound-guided biopsy left breast mass. EXAM: DIAGNOSTIC LEFT MAMMOGRAM POST ULTRASOUND BIOPSY COMPARISON:  Previous exam(s). FINDINGS: Mammographic images were obtained following ultrasound guided biopsy of left  breast mass 1 o'clock position. The biopsy marking clip is in expected position at the site of biopsy. There is a moderate-sized surrounding hematoma. IMPRESSION: Appropriate positioning of the ribbon shaped biopsy marking clip at the site of biopsy in the left breast 1 o'clock position. Final Assessment: Post Procedure Mammograms for Marker Placement Electronically Signed   By: Lovey Newcomer M.D.   On: 07/02/2020 08:31   [place] LT BREAST BX W LOC DEV 1ST LESION IMG BX SPEC US GUIDE  Addendum Date: 07/04/2020   ADDENDUM REPORT: 07/04/2020 14:11 ADDENDUM: Pathology revealed GRADE I INVASIVE DUCTAL CARCINOMA, DUCTAL CARCINOMA IN SITU of the LEFT breast, 1 o'clock. This was found to be concordant by Dr. Lovey Newcomer. Pathology results were discussed with the patient by telephone. The patient reported doing well after the biopsy with tenderness at the site. Post biopsy instructions and care were reviewed and questions were answered. The patient was encouraged to call [facility] for any additional concerns. The patient was referred to [facility] on July 10, 2020. Pathology results reported by Stacie Acres RN on 07/04/2020. Electronically Signed   By: Lovey Newcomer M.D.   On: 07/04/2020 14:11   Result Date: 07/04/2020 CLINICAL DATA:  Patient with  indeterminate left breast mass 1 o'clock position. EXAM: ULTRASOUND GUIDED LEFT BREAST CORE NEEDLE BIOPSY COMPARISON:  Previous exam(s). PROCEDURE: I met with the patient and we discussed the procedure of ultrasound-guided biopsy, including benefits and alternatives. We discussed the high likelihood of a successful procedure. We discussed the risks of the procedure, including infection, bleeding, tissue injury, clip migration, and inadequate sampling. Informed written consent was given. The usual time-out protocol was performed immediately prior to the procedure. Lesion quadrant: Upper outer quadrant Using sterile technique and 1% Lidocaine as local anesthetic, under direct ultrasound visualization, a 14 gauge spring-loaded device was used to perform biopsy of left breast mass 1 o'clock position using a lateral approach. At the conclusion of the procedure ribbon shaped tissue marker clip was deployed into the biopsy cavity. Follow up 2 view mammogram was performed and dictated separately. IMPRESSION: Ultrasound guided biopsy of left breast mass 1 o'clock position. No apparent complications. Electronically Signed: By: Lovey Newcomer M.D. On: 07/02/2020 08:24     ELIGIBLE FOR AVAILABLE RESEARCH PROTOCOL: No  ASSESSMENT: 65 y.o. [place] woman status post left breast upper outer quadrant biopsy 07/02/2020 for a clinical T1b N0, stage IA invasive ductal carcinoma, grade 1, estrogen and progesterone receptor positive, HER2 not amplified, with an MIB-1 of 5%  (1) genetics testing  (2) definitive surgery pending  (3) Oncotype to be sent from the definitive surgical sample: Chemotherapy not anticipated  (4) adjuvant radiation  (5) antiestrogens to start at the completion of local treatment.  PLAN: I met today with Tammy [name] to review her new diagnosis. Specifically we discussed the biology of her breast cancer, its diagnosis, staging, treatment  options and prognosis. We first reviewed the fact that cancer  is not one disease but more than 100 different diseases and that it is important to keep them separate-- otherwise when friends and relatives discuss their own cancer experiences with Tammy [name] confusion can result. Similarly we explained that if breast cancer spreads to the bone or liver, the patient would not have bone cancer or liver cancer, but breast cancer in the bone and breast cancer in the liver: one cancer in three places-- not 3 different cancers which otherwise  would have to be treated in 3 different ways.  We discussed the difference between local and systemic therapy. In terms of loco-regional treatment, lumpectomy plus radiation is equivalent to mastectomy as far as survival is concerned. For this reason, and because the cosmetic results are generally superior, we recommend breast conserving surgery.   We then discussed the rationale for systemic therapy. There is some risk that this cancer may have already spread to other parts of her body. Patients frequently ask at this point about bone scans, CAT scans and PET scans to find out if they have occult breast cancer somewhere else. The problem is that in early stage disease we are much more likely to find false positives then true cancers and this would expose the patient to unnecessary procedures as well as unnecessary radiation. Scans cannot answer the question the patient really would like to know, which is whether she has microscopic disease elsewhere in her body. For those reasons we do not recommend them.  Of course we would proceed to aggressive evaluation of any symptoms that might suggest metastatic disease, but that is not the case here.  Next we went over the options for systemic therapy which are anti-estrogens, anti-HER-2 immunotherapy, and chemotherapy. Tammy [name] does not meet criteria for anti-HER-2 immunotherapy. She is a good candidate for anti-estrogens.  The question of chemotherapy is more complicated. Chemotherapy is most  effective in rapidly growing, aggressive tumors. It is much less effective in low-grade, slow growing cancers, like Tammy [name] 's. For that reason we are going to request an Oncotype from the definitive surgical sample, as suggested by NCCN guidelines.   Tammy [name] also qualifies for genetics testing. In patients who carry a deleterious mutation [for example in a  BRCA gene], the risk of a new breast cancer developing in the future may be sufficiently great that the patient may choose bilateral mastectomies. However if she wishes to keep her breasts in that situation it is safe to do so. That would require intensified screening, which generally means not only yearly mammography but a yearly breast MRI as well.   Tammy [name] has a good understanding of the overall plan. She agrees with it. She knows the goal of treatment in her case is cure. She will call with any problems that may develop before her next visit here.  Total encounter time 65 minutes.Sarajane Jews C. Annika Selke, MD 07/10/2020 2:42 PM Medical Oncology and Hematology [facility] [place], [place] [address] Tel. [phone]    Fax. [phone]   This document serves as a record of services personally performed by Lurline Del, MD. It was created on his behalf by Wilburn Mylar, a trained medical scribe. The creation of this record is based on the scribe's personal observations and the provider's statements to them.   I, Lurline Del MD, have reviewed the above documentation for accuracy and completeness, and I agree with the above.    *Total Encounter Time as defined by the Centers for Medicare and Medicaid Services includes, in addition to the face-to-face time of a patient visit (documented in the note above) non-face-to-face time: obtaining and reviewing outside history, ordering and reviewing medications, tests or procedures, care coordination (communications with other health care professionals or  caregivers) and documentation in the medical record.

## 2020-07-10 NOTE — Therapy (Signed)
[place], [place], [address] Phone: [phone]   Fax:  [phone]  Physical Therapy Evaluation  Patient Details  Name: Mariane Burpee MRN: [id number] Date of Birth: 13-Sep-1955 Referring Provider (PT): Dr. [name]   Encounter Date: 07/10/2020   PT End of Session - 07/10/20 1423    Visit Number 1    Number of Visits 2    Date for PT Re-Evaluation 09/04/20    PT Start Time 5697    PT Stop Time 1508    PT Time Calculation (min) 30 min    Activity Tolerance Patient tolerated treatment well    Behavior During Therapy [facility] for tasks assessed/performed           Past Medical History:  Diagnosis Date  . Arthritis   . Bipolar disorder ([place])   . Breast cancer ([place])   . Depression   . GERD (gastroesophageal reflux disease)   . HTN (hypertension)   . Stroke [facility]) 09/2012   slurred speech and left sided weakness/notes 10/04/2012    Past Surgical History:  Procedure Laterality Date  . CESAREAN SECTION  1986  . fracture of leg     2 months ago    There were no vitals filed for this visit.    Subjective Assessment - 07/10/20 1416    Subjective Patient reports she is here today to be seen by her medical team for her newly diagnosed left breast cancer.    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 It measures 9 mm and is located in the upper outer quadrant. It is ER/PR positive and HER2 negative with a Ki67 of 5%.    Patient Stated Goals Reduce lymphedema risk and learn post op shoulder ROM HEP    Currently in Pain? No/denies              [facility] PT Assessment - 07/10/20 0001      Assessment   Medical Diagnosis Left breast cancer    Referring Provider (PT) Dr. [name]    Onset Date/Surgical Date 06/05/20    Hand Dominance Right    Prior Therapy none       Precautions   Precautions Other (comment)    Precaution Comments active cancer      Restrictions   Weight Bearing Restrictions No      Balance Screen   Has the patient fallen in the past 6 months No    Has the patient had a decrease in activity level because of a fear of falling?  No    Is the patient reluctant to leave their home because of a fear of falling?  No      Home Environment   Living Environment Private residence    Living Arrangements Alone    Available Help at Discharge Family      Prior Function   Level of [place] Retired    Leisure She does not exercise      Cognition   Overall Cognitive Status Within Functional Limits for tasks assessed      Posture/Postural Control   Posture/Postural Control Postural limitations    Postural Limitations Rounded Shoulders;Forward head      ROM / Strength   AROM / PROM / Strength AROM;Strength      AROM   Overall AROM Comments Cervical AROM is WNL  AROM Assessment Site Shoulder    Right/Left Shoulder Right;Left    Right Shoulder Extension 56 Degrees    Right Shoulder Flexion 158 Degrees    Right Shoulder ABduction 158 Degrees    Right Shoulder Internal Rotation 73 Degrees    Right Shoulder External Rotation 81 Degrees    Left Shoulder Extension 59 Degrees    Left Shoulder Flexion 154 Degrees    Left Shoulder ABduction 158 Degrees    Left Shoulder Internal Rotation 80 Degrees    Left Shoulder External Rotation 78 Degrees      Strength   Overall Strength Within functional limits for tasks performed             LYMPHEDEMA/ONCOLOGY QUESTIONNAIRE - 07/10/20 0001      Type   Cancer Type Left breast cancer      Lymphedema Assessments   Lymphedema Assessments Upper extremities      Right Upper Extremity Lymphedema   10 cm Proximal to Olecranon Process 26 cm    Olecranon Process 22.7 cm    10 cm Proximal to Ulnar Styloid Process 19.4 cm    Just Proximal to Ulnar Styloid Process  13.9 cm    Across Hand at [employer] 17.3 cm    At [place] of 2nd Digit 5.9 cm      Left Upper Extremity Lymphedema   10 cm Proximal to Olecranon Process 27.3 cm    Olecranon Process 23.4 cm    10 cm Proximal to Ulnar Styloid Process 18.4 cm    Just Proximal to Ulnar Styloid Process 14 cm    Across Hand at [employer] 17.5 cm    At [place] of 2nd Digit 5.4 cm           L-DEX FLOWSHEETS - 07/10/20 1400      L-DEX LYMPHEDEMA SCREENING   Measurement Type Unilateral    L-DEX MEASUREMENT EXTREMITY Upper Extremity    POSITION  Standing    DOMINANT SIDE Right    At Risk Side Left    BASELINE SCORE (UNILATERAL) 5.5           The patient was assessed using the L-Dex machine today to produce a lymphedema index baseline score. The patient will be reassessed on a regular basis (typically every 3 months) to obtain new L-Dex scores. If the score is > 6.5 points away from his/her baseline score indicating onset of subclinical lymphedema, it will be recommended to wear a compression garment for 4 weeks, 12 hours per day and then be reassessed. If the score continues to be > 6.5 points from baseline at reassessment, we will initiate lymphedema treatment. Assessing in this manner has a 95% rate of preventing clinically significant lymphedema.      Katina Dung - 07/10/20 0001    Open a tight or new jar Moderate difficulty    Do heavy household chores (wash walls, wash floors) Unable    Carry a shopping bag or briefcase Moderate difficulty    Wash your back Mild difficulty    Use a knife to cut food Mild difficulty    Recreational activities in which you take some force or impact through your arm, shoulder, or hand (golf, hammering, tennis) Unable    During the past week, to what extent has your arm, shoulder or hand problem interfered with your normal social activities with family, friends, neighbors, or groups? Not at all    During the past week, to what extent has your arm, shoulder  or  hand problem limited your work or other regular daily activities Not at all    Arm, shoulder, or hand pain. None    Tingling (pins and needles) in your arm, shoulder, or hand None    Difficulty Sleeping No difficulty    DASH Score 31.82 %            Objective measurements completed on examination: See above findings.       Patient was instructed today in a home exercise program today for post op shoulder range of motion. These included active assist shoulder flexion in sitting, scapular retraction, wall walking with shoulder abduction, and hands behind head external rotation.  She was encouraged to do these twice a day, holding 3 seconds and repeating 5 times when permitted by her physician.            PT Education - 07/10/20 1423    Education Details Lymphedema education and post op HEP    Person(s) Educated Patient    Methods Explanation;Demonstration;Handout    Comprehension Returned demonstration;Verbalized understanding               PT Long Term Goals - 07/10/20 1508      PT LONG TERM GOAL #1   Title Patient will demonstrate she has regained full shoulder ROM and function post operatively compared to baselines.    Time 8    Period Weeks    Status New    Target Date 09/04/20           Breast Clinic Goals - 07/10/20 1438      Patient will be able to verbalize understanding of pertinent lymphedema risk reduction practices relevant to her diagnosis specifically related to skin care.   Time 1    Period Days    Status Achieved      Patient will be able to return demonstrate and/or verbalize understanding of the post-op home exercise program related to regaining shoulder range of motion.   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 It measures 9 mm and is located in the upper outer  quadrant. It is ER/PR positive and HER2 negative with a Ki67 of 5%. Her multidisciplinary medical team met prior to her assessments to determine a recommended treatment plan. She is planning to have a left lumpectomy and sentinel node biopsy followed by Oncotype testing, radiation, and anti-estrogen therapy. She will benefit from a post op PT reassessment to determine needs and from L-Dex screens every 3 months for 2 years to detect subclinical lymphedema.    Stability/Clinical Decision Making Stable/Uncomplicated    Clinical Decision Making Low    Rehab Potential Excellent    PT Frequency --   Eval and 1 f/u visit   PT Treatment/Interventions ADLs/Self Care Home Management;Therapeutic exercise;Patient/family education    PT Next Visit Plan Will reassess 3-4 weeks post op    PT Home Exercise Plan Post op shoulder ROM HEP    Consulted and Agree with Plan of Care Patient           Patient will benefit from skilled therapeutic intervention in order to improve the following deficits and impairments:  Postural dysfunction,Decreased range of motion,Decreased knowledge of precautions,Impaired UE functional use,Pain  Visit Diagnosis: Malignant neoplasm  of upper-outer quadrant of left breast in female, estrogen receptor positive ([place]) - Plan: PT plan of care cert/re-cert  Abnormal posture - Plan: PT plan of care cert/re-cert   Patient will follow up at outpatient cancer rehab 3-4 weeks following surgery.  If the patient requires physical therapy at that time, a specific plan will be dictated and sent to the referring physician for approval. The patient was educated today on appropriate basic range of motion exercises to begin post operatively and the importance of attending the After Breast Cancer class following surgery.  Patient was educated today on lymphedema risk reduction practices as it pertains to recommendations that will benefit the patient immediately following surgery.  She verbalized good  understanding.      Problem List Patient Active Problem List   Diagnosis Date Noted  . Malignant neoplasm of upper-outer quadrant of left breast in female, estrogen receptor positive ([place]) 07/09/2020  . 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 Chronic pain syndrome 10/06/2012  . Nicotine dependence 10/06/2012  . Anxiety state, unspecified 10/05/2012  . Bipolar disorder, unspecified ([place]) 10/05/2012  . Slurred speech 10/04/2012  . Left-sided weakness 10/04/2012  . Hyperkalemia 10/04/2012  . Acute CVA (cerebrovascular accident) ([place]) 10/04/2012  . HTN (hypertension)    Annia Friendly, [place] 07/10/20 3:14 PM  [place], [place], [address] Phone: [phone]   Fax:  [phone]  Name: Marvelene Stoneberg MRN: [id number] Date of Birth: 10-02-1955

## 2020-07-10 NOTE — Progress Notes (Signed)
REFERRING PROVIDER: [name], MD [place],  [place] [address]  PRIMARY PROVIDER:  [name], MD  PRIMARY REASON FOR VISIT:  Encounter Diagnoses  Name Primary?  . Malignant neoplasm of upper-outer quadrant of left breast in female, estrogen receptor positive ([place]) Yes  . Family history of breast cancer   . Family history of ovarian cancer   . Family history of colon cancer      HISTORY OF PRESENT ILLNESS:   Tammy [name], a 65 y.o. female, was seen for a [facility] cancer genetics consultation at the request of Dr. Jana Hakim due to a personal and family history of cancer.  Tammy [name] presents to clinic today to discuss the possibility of a hereditary predisposition to cancer, to discuss genetic testing, and to further clarify her future cancer risks, as well as potential cancer risks for family members.   In 2022, at the age of 37, Tammy [name] was diagnosed with invasive ductal carcinoma of the left breast (ER+/PR+/HER2-). The preliminary treatment plan includes breast conserving surgery, Oncotype, adjuvant radiation, and anti-estrogens.    CANCER HISTORY:  Oncology History   No history exists.    RISK FACTORS:  Menarche was at age 49.  First live birth at age 54.  OCP use for approximately 11 years.  Ovaries intact: yes.  Hysterectomy: no.  Menopausal status: postmenopausal.  HRT use: 0 years. Colonoscopy: no; not examined. Mammogram within the last year: yes. Up to date with pelvic exams: most recent PAP in 2017.   Past Medical History:  Diagnosis Date  . Arthritis   . Bipolar disorder ([place])   . Depression   . GERD (gastroesophageal reflux disease)   . HTN (hypertension)   . Stroke [facility]) 09/2012   slurred speech and left sided weakness/notes 10/04/2012    Past Surgical History:  Procedure Laterality Date  . CESAREAN SECTION  1986  . fracture of leg     2 months ago    Social History   Socioeconomic History  . Marital  status: Divorced    Spouse name: Not on file  . Number of children: 2  . Years of education: Not on file  . Highest education level: Not on file  Occupational History  . Occupation: diabled  Tobacco Use  . Smoking status: Current Every Day Smoker    Packs/day: 0.50    Years: 40.00    Pack years: 20.00    Types: Cigarettes  . Smokeless tobacco: Never Used  . Tobacco comment: has used e-cigarettes, no vaping   Vaping Use  . Vaping Use: Never used  Substance and Sexual Activity  . Alcohol use: Not Currently    Alcohol/week: 0.0 standard drinks    Comment: 1 per week  . Drug use: No  . Sexual activity: Not on file  Other Topics Concern  . Not on file  Social History Narrative   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   Caffeine: 1 cup in the morning    Social Determinants of Health   Financial Resource Strain: Not on file  Food Insecurity: Not on file  Transportation Needs: Not on file  Physical Activity: Not on file  Stress: Not on file  Social Connections: Not on file     FAMILY HISTORY:  We obtained a detailed, 4-generation family history.  Significant diagnoses are listed below: Family History  Problem Relation Age of Onset  . Breast cancer Maternal Aunt  62  . Breast cancer Other        PGM's sister; dx mid 99s  . Colon cancer Maternal Aunt 69  . Colon cancer Other        PGM's brother; dx 58s  . Ovarian cancer Other        PGM's mother; dx unknown age    Tammy [name] is unaware of previous family history of genetic testing for hereditary cancer risks. There is no reported Ashkenazi Jewish ancestry. There is no known consanguinity.  GENETIC COUNSELING ASSESSMENT: Tammy [name] is a 65 y.o. female with a personal and family history of cancer which is somewhat suggestive of a hereditary cancer syndrome and predisposition to cancer given the presence of related cancers in her family. We, therefore, discussed and recommended the following at  today's visit.   DISCUSSION: We discussed that 5 - 10% of cancer is hereditary, with most cases of hereditary breast cancer associated with mutations in BRCA1/2.  There are other genes that can be associated with hereditary breast cancer syndromes.  Type of cancer risk and level of risk are gene-specific.  We discussed that testing is beneficial for several reasons including knowing how to follow individuals after completing their treatment, identifying whether potential treatment options would be beneficial, and understanding if other family members could be at risk for cancer and allowing them to undergo genetic testing.   We reviewed the characteristics, features and inheritance patterns of hereditary cancer syndromes. We also discussed genetic testing, including the appropriate family members to test, the process of testing, insurance coverage and turn-around-time for results. We discussed the implications of a negative, positive and/or variant of uncertain significant result. In order to get genetic test results in a timely manner so that Tammy [name] can use these genetic test results for surgical decisions, we recommended Tammy [name] pursue genetic testing for the [employer].  The BRCAplus panel offered by [employer] and includes sequencing and deletion/duplication analysis for the following 8 genes: ATM, BRCA1, BRCA2, CDH1, CHEK2, PALB2, PTEN, and TP53.Once complete, we recommend Tammy [name] pursue reflex genetic testing to a more comprehensive panel.   Tammy [name]  was offered a common hereditary cancer panel (47 genes) and an expanded pan-cancer panel (77 genes). Tammy [name] was informed of the benefits and limitations of each panel, including that expanded pan-cancer panels contain genes that do not have clear management guidelines at this point in time.  We also discussed that as the number of genes included on a panel increases, the chances of variants of uncertain significance  increases.  After considering the benefits and limitations of each gene panel, Tammy [name]  elected to have an expanded pan-cancer panel through [place].  The CancerNext-Expanded gene panel offered by [facility] and includes sequencing, rearrangement, and RNA analysis for the following 77 genes: AIP, ALK, APC, ATM, AXIN2, BAP1, BARD1, BLM, BMPR1A, BRCA1, BRCA2, BRIP1, CDC73, CDH1, CDK4, CDKN1B, CDKN2A, CHEK2, CTNNA1, DICER1, FANCC, FH, FLCN, GALNT12, KIF1B, LZTR1, MAX, MEN1, MET, MLH1, MSH2, MSH3, MSH6, MUTYH, NBN, NF1, NF2, NTHL1, PALB2, PHOX2B, PMS2, POT1, PRKAR1A, PTCH1, PTEN, RAD51C, RAD51D, RB1, RECQL, RET, SDHA, SDHAF2, SDHB, SDHC, SDHD, SMAD4, SMARCA4, SMARCB1, SMARCE1, STK11, SUFU, TMEM127, TP53, TSC1, TSC2, VHL and XRCC2 (sequencing and deletion/duplication); EGFR, EGLN1, HOXB13, KIT, MITF, PDGFRA, POLD1, and POLE (sequencing only); EPCAM and GREM1 (deletion/duplication only).   Based on Tammy [name]'s personal and family history of cancer, she meets medical criteria for genetic testing. Despite that she meets criteria, she may still have  an out of pocket cost. We discussed that if her out of pocket cost for testing is over $100, the laboratory should contact her to discuss self-pay options and patient pay assistance programs.   PLAN: After considering the risks, benefits, and limitations, Tammy [name] provided informed consent to pursue genetic testing and the blood sample was sent to [facility] for analysis of the [place] [facility]. Results should be available within approximately 1-2 weeks' time, at which point they will be disclosed by telephone to Tammy [name], as will any additional recommendations warranted by these results. Tammy [name] will receive a summary of her genetic counseling visit and a copy of her results once available. This information will also be available in Epic.   Lastly, we encouraged Tammy [name] to remain in contact with cancer  genetics annually so that we can continuously update the family history and inform her of any changes in cancer genetics and testing that may be of benefit for this family.   Tammy [name] questions were answered to her satisfaction today. Our contact information was provided should additional questions or concerns arise. Thank you for the referral and allowing [place] to share in the care of your patient.   Farris Blash M. Joette Catching, [place], [facility] Genetic Counselor [email or web address] (P) [phone]  The patient was seen for a total of 25 minutes in face-to-face genetic counseling.  The patient brought her cousin, Tammy [name].  Her daughter, Tammy [name], attended the session, virtually through WebEx. Drs. Magrinat, Lindi Adie and/or Burr Medico were available to discuss this case as needed.   _______________________________________________________________________ For Office Staff:  Number of people involved in session: 3 Was an Intern/ student involved with case: yes; pre-medical student observed this session

## 2020-07-10 NOTE — Progress Notes (Signed)
Radiation Oncology         [phone] ________________________________  Name: Tammy [name]        MRN: [id number]  Date of Service: 07/10/2020 DOB: 07-19-1955  PF:[id number], Sami, MD  [name], MD     REFERRING PHYSICIAN: [name], MD   DIAGNOSIS: The encounter diagnosis was Malignant neoplasm of upper-outer quadrant of left breast in female, estrogen receptor positive ([place]).   HISTORY OF PRESENT ILLNESS: Tammy [name] is a 65 y.o. female seen in the multidisciplinary breast clinic for a new diagnosis of left breast cancer. The patient was noted to have a screening detected mass in the left breast at the 1:00 position. The site measured 9 mm and her axilla was negative for adenopathy by diagnostic imaging. 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 Her cancer was ER/PR positive, Her2 was negative with a Ki 67 of 5%. She is seen today to discuss treatment of her cancer.     PREVIOUS RADIATION THERAPY: No   PAST MEDICAL HISTORY:  Past Medical History:  Diagnosis Date  . Arthritis   . Bipolar disorder ([place])   . Depression   . GERD (gastroesophageal reflux disease)   . HTN (hypertension)   . Stroke [employer]) 09/2012   slurred speech and left sided weakness/notes 10/04/2012       PAST SURGICAL HISTORY: Past Surgical History:  Procedure Laterality Date  . CESAREAN SECTION  1986  . fracture of leg     2 months ago     FAMILY HISTORY:  Family History  Problem Relation Age of Onset  . Breast cancer Mother   . Other Mother        blood clots  . Diabetes Brother   . Neuropathy Brother        diabetic neuropathy  . Diabetes Sister   . Neuropathy Sister        diabetic neuropathy  . Breast cancer Sister   . Diabetes Paternal Aunt   . Breast cancer Maternal Aunt   . Breast cancer Paternal Aunt   . Colon cancer Paternal Uncle      SOCIAL HISTORY:  reports that she has been smoking cigarettes.  She has a 20.00 pack-year smoking history. She has never used smokeless tobacco. She reports previous alcohol use. She reports that she does not use drugs.  The patient is divorced and lives in [place]. She is a retired Marine scientist.    ALLERGIES: Patient has no known allergies.   MEDICATIONS:  Current Outpatient Medications  Medication Sig Dispense Refill  . Acetaminophen (TYLENOL PO) Take by mouth as needed.    [name] amLODipine (NORVASC) 10 MG tablet Take 10 mg by mouth daily.    [name] atenolol (TENORMIN) 25 MG tablet Take 25 mg by mouth daily.    [name] gabapentin (NEURONTIN) 300 MG capsule Take 1-2 capsules (300-600 mg total) by mouth 2 (two) times daily. 360 capsule 2  . hydrochlorothiazide (HYDRODIURIL) 25 MG tablet Take 25 mg by mouth daily.    [name] HYDROcodone-acetaminophen (NORCO/VICODIN) 5-325 MG tablet Take 1 tablet by mouth 3 (three) times daily as needed.    . IBUPROFEN PO Take by mouth as needed.    [name] lisinopril (ZESTRIL) 20 MG tablet Take 20 mg by mouth daily.    . Multiple Vitamin (MULTIVITAMIN PO) Take by mouth daily.    . pantoprazole (PROTONIX) 40 MG tablet Take 40 mg by mouth daily.     No current  facility-administered medications for this encounter.     REVIEW OF SYSTEMS: On review of systems, the patient reports that she is doing well overall. She denies any specific breast related concerns at this time.      PHYSICAL EXAM:  Wt Readings from Last 3 Encounters:  05/17/19 157 lb (71.2 kg)  12/01/18 153 lb (69.4 kg)  05/31/15 171 lb 4.8 oz (77.7 kg)   Temp Readings from Last 3 Encounters:  05/17/19 (!) 96.9 F (36.1 C)  12/01/18 (!) 96.5 F (35.8 C)  05/13/15 98.1 F (36.7 C) (Oral)   BP Readings from Last 3 Encounters:  05/17/19 138/78  12/01/18 (!) 150/84  05/31/15 (!) 172/90   Pulse Readings from Last 3 Encounters:  05/17/19 76  12/01/18 (!) 56  05/31/15 68    In general this is a well appearing African-American female in no acute distress. She's alert and  oriented x4 and appropriate throughout the examination. Cardiopulmonary assessment is negative for acute distress and she exhibits normal effort. Bilateral breast exam is deferred.    ECOG = 0  0 - Asymptomatic (Fully active, able to carry on all predisease activities without restriction)  1 - Symptomatic but completely ambulatory (Restricted in physically strenuous activity but ambulatory and able to carry out work of a light or sedentary nature. For example, light housework, office work)  2 - Symptomatic, <50% in bed during the day (Ambulatory and capable of all self care but unable to carry out any work activities. Up and about more than 50% of waking hours)  3 - Symptomatic, >50% in bed, but not bedbound (Capable of only limited self-care, confined to bed or chair 50% or more of waking hours)  4 - Bedbound (Completely disabled. Cannot carry on any self-care. Totally confined to bed or chair)  5 - Death   Eustace Pen MM, Creech RH, Tormey DC, et al. [phone]). "Toxicity and response criteria of the [employer]". [place] Oncol. 5 (6): 649-55    LABORATORY DATA:  Lab Results  Component Value Date   WBC 7.6 05/11/2015   HGB 12.2 05/11/2015   HCT 35.8 (L) 05/11/2015   MCV 86.5 05/11/2015   PLT 209 05/11/2015   Lab Results  Component Value Date   NA 141 10/04/2012   K 3.2 (L) 10/04/2012   CL 107 10/04/2012   CO2 23 10/04/2012   Lab Results  Component Value Date   ALT 93 (H) 10/04/2012   AST 152 (H) 10/04/2012   ALKPHOS 109 10/04/2012   BILITOT 0.7 10/04/2012      RADIOGRAPHY: US BREAST LTD UNI LEFT INC AXILLA  Result Date: 06/27/2020 CLINICAL DATA:  Patient returns today to evaluate a possible LEFT breast mass identified on recent screening EXAM: DIGITAL DIAGNOSTIC UNILATERAL LEFT MAMMOGRAM WITH TOMOSYNTHESIS AND CAD; ULTRASOUND LEFT BREAST LIMITED TECHNIQUE: Left digital diagnostic mammography and breast tomosynthesis was performed. The images were  evaluated with computer-aided detection.; Targeted ultrasound examination of the left breast was performed COMPARISON:  Previous exams including recent screening mammogram dated 06/05/2020. ACR Breast Density Category c: The breast tissue is heterogeneously dense, which may obscure small masses. FINDINGS: LEFT breast diagnostic mammogram: On today's additional diagnostic views, there is a spiculated mass within the outer LEFT breast, 1-2 o'clock axis region, with associated architectural distortion, measuring approximately 4 mm greatest dimension. Targeted ultrasound is performed, showing a spiculated mass in the LEFT breast at the 1 o'clock axis, 7 cm from the nipple, measuring 9 x 6 x  7 mm, corresponding to the mammographic finding. LEFT axilla was evaluated with ultrasound showing no enlarged or morphologically abnormal lymph nodes. IMPRESSION: Highly suspicious mass within the LEFT breast at the 1 o'clock axis, 7 cm from the nipple, measuring 9 mm, corresponding to the mammographic finding. Ultrasound-guided biopsy is recommended. RECOMMENDATION: Ultrasound-guided biopsy of the LEFT breast mass at the 1 o'clock axis. Ultrasound-guided biopsy is scheduled for May 24th. I have discussed the findings and recommendations with the patient. If applicable, a reminder letter will be sent to the patient regarding the next appointment. BI-RADS CATEGORY  5: Highly suggestive of malignancy. Electronically Signed   By: Franki Cabot M.D.   On: 06/27/2020 13:17   MM DIAG BREAST TOMO UNI LEFT  Result Date: 06/27/2020 CLINICAL DATA:  Patient returns today to evaluate a possible LEFT breast mass identified on recent screening EXAM: DIGITAL DIAGNOSTIC UNILATERAL LEFT MAMMOGRAM WITH TOMOSYNTHESIS AND CAD; ULTRASOUND LEFT BREAST LIMITED TECHNIQUE: Left digital diagnostic mammography and breast tomosynthesis was performed. The images were evaluated with computer-aided detection.; Targeted ultrasound examination of the left  breast was performed COMPARISON:  Previous exams including recent screening mammogram dated 06/05/2020. ACR Breast Density Category c: The breast tissue is heterogeneously dense, which may obscure small masses. FINDINGS: LEFT breast diagnostic mammogram: On today's additional diagnostic views, there is a spiculated mass within the outer LEFT breast, 1-2 o'clock axis region, with associated architectural distortion, measuring approximately 4 mm greatest dimension. Targeted ultrasound is performed, showing a spiculated mass in the LEFT breast at the 1 o'clock axis, 7 cm from the nipple, measuring 9 x 6 x 7 mm, corresponding to the mammographic finding. LEFT axilla was evaluated with ultrasound showing no enlarged or morphologically abnormal lymph nodes. IMPRESSION: Highly suspicious mass within the LEFT breast at the 1 o'clock axis, 7 cm from the nipple, measuring 9 mm, corresponding to the mammographic finding. Ultrasound-guided biopsy is recommended. RECOMMENDATION: Ultrasound-guided biopsy of the LEFT breast mass at the 1 o'clock axis. Ultrasound-guided biopsy is scheduled for May 24th. I have discussed the findings and recommendations with the patient. If applicable, a reminder letter will be sent to the patient regarding the next appointment. BI-RADS CATEGORY  5: Highly suggestive of malignancy. Electronically Signed   By: Franki Cabot M.D.   On: 06/27/2020 13:17   MM CLIP PLACEMENT LEFT  Result Date: 07/02/2020 CLINICAL DATA:  Patient status post ultrasound-guided biopsy left breast mass. EXAM: DIAGNOSTIC LEFT MAMMOGRAM POST ULTRASOUND BIOPSY COMPARISON:  Previous exam(s). FINDINGS: Mammographic images were obtained following ultrasound guided biopsy of left breast mass 1 o'clock position. The biopsy marking clip is in expected position at the site of biopsy. There is a moderate-sized surrounding hematoma. IMPRESSION: Appropriate positioning of the ribbon shaped biopsy marking clip at the site of biopsy in  the left breast 1 o'clock position. Final Assessment: Post Procedure Mammograms for Marker Placement Electronically Signed   By: Lovey Newcomer M.D.   On: 07/02/2020 08:31   [place] LT BREAST BX W LOC DEV 1ST LESION IMG BX SPEC US GUIDE  Addendum Date: 07/04/2020   ADDENDUM REPORT: 07/04/2020 14:11 ADDENDUM: Pathology revealed GRADE I INVASIVE DUCTAL CARCINOMA, DUCTAL CARCINOMA IN SITU of the LEFT breast, 1 o'clock. This was found to be concordant by Dr. Lovey Newcomer. Pathology results were discussed with the patient by telephone. The patient reported doing well after the biopsy with tenderness at the site. Post biopsy instructions and care were reviewed and questions were answered. The patient was encouraged to call [facility] for any additional concerns. The patient was referred to [facility] on July 10, 2020. Pathology results reported by Stacie Acres RN on 07/04/2020. Electronically Signed   By: Lovey Newcomer M.D.   On: 07/04/2020 14:11   Result Date: 07/04/2020 CLINICAL DATA:  Patient with indeterminate left breast mass 1 o'clock position. EXAM: ULTRASOUND GUIDED LEFT BREAST CORE NEEDLE BIOPSY COMPARISON:  Previous exam(s). PROCEDURE: I met with the patient and we discussed the procedure of ultrasound-guided biopsy, including benefits and alternatives. We discussed the high likelihood of a successful procedure. We discussed the risks of the procedure, including infection, bleeding, tissue injury, clip migration, and inadequate sampling. Informed written consent was given. The usual time-out protocol was performed immediately prior to the procedure. Lesion quadrant: Upper outer quadrant Using sterile technique and 1% Lidocaine as local anesthetic, under direct ultrasound visualization, a 14 gauge spring-loaded device was used to perform biopsy of left breast mass 1 o'clock position using a lateral approach. At the  conclusion of the procedure ribbon shaped tissue marker clip was deployed into the biopsy cavity. Follow up 2 view mammogram was performed and dictated separately. IMPRESSION: Ultrasound guided biopsy of left breast mass 1 o'clock position. No apparent complications. Electronically Signed: By: Lovey Newcomer M.D. On: 07/02/2020 08:24       IMPRESSION/PLAN: 1. *Stage IA, [id number], grade 1, ER/PR positive of the left breast.   Dr. [name] discusses the pathology findings and reviews the nature of early stage left breast disease. The consensus from the breast conference includes breast conservation with lumpectomy with sentinel node biopsy. Depending on the size of the final tumor measurements rendered by pathology, the tumor may be tested for Oncotype Dx score to determine a role for systemic therapy. Provided that chemotherapy is not indicated, the patient's course would then be followed by external radiotherapy to the breast  to reduce risks of local recurrence followed by antiestrogen therapy. We discussed the risks, benefits, short, and long term effects of radiotherapy, as well as the curative intent, and the patient is interested in proceeding. Dr. [name] discusses the delivery and logistics of radiotherapy and anticipates a course of 4 or up to 6 1/2 weeks of radiotherapy to the left breast with deep inspiration breath-hold technique. We will see her back a few weeks after surgery to discuss the simulation process and anticipate we starting radiotherapy about 4-6 weeks after surgery.  2. Possible genetic predisposition to malignancy. The patient is a candidate for genetic testing given her personal and family history. She was offered referral and will meet with genetic testing.   In a visit lasting 60 minutes, greater than 50% of the time was spent face to face reviewing her case, as well as in preparation of, discussing, and coordinating the patient's care.  The above documentation reflects my direct  findings during this shared patient visit. Please see the separate note by Dr. [name] on this date for the remainder of the patient's plan of care.    Carola Rhine, [facility]    **Disclaimer: This note was dictated with voice recognition software. Similar sounding words can inadvertently be transcribed and this note may contain transcription errors which may not have been corrected upon publication of note.**

## 2020-07-11 ENCOUNTER — Other Ambulatory Visit: Payer: Self-pay | Admitting: Surgery

## 2020-07-11 ENCOUNTER — Encounter: Payer: Self-pay | Admitting: General Practice

## 2020-07-11 ENCOUNTER — Telehealth: Payer: Self-pay | Admitting: Oncology

## 2020-07-11 DIAGNOSIS — Z853 Personal history of malignant neoplasm of breast: Secondary | ICD-10-CM

## 2020-07-11 NOTE — Progress Notes (Signed)
[place] Psychosocial Distress Screening Spiritual Care  Left voicemail for Magin following Breast Multidisciplinary Clinic to introduce [facility] team/resources, reviewing distress screen per protocol, and to assess for other psychosocial needs.  The patient scored a 0 on the Psychosocial Distress Thermometer which indicates minimal distress.   ONCBCN DISTRESS SCREENING 07/11/2020  Screening Type Initial Screening  Distress experienced in past week (1-10) 0  Referral to support programs Yes   Ms Griffy received full packet of [place] [employer]) team and programming resources. Attempted follow-up call, leaving voicemail with encouragement to return call.  Follow up needed: No.   Chaplain Lorrin Jackson, [place], [facility] Pager [phone] Voicemail [phone]

## 2020-07-11 NOTE — Telephone Encounter (Signed)
Scheduled appointment per 06/01 los. Left a detailed message.

## 2020-07-16 ENCOUNTER — Telehealth: Payer: Self-pay | Admitting: *Deleted

## 2020-07-16 ENCOUNTER — Encounter: Payer: Self-pay | Admitting: *Deleted

## 2020-07-16 NOTE — Telephone Encounter (Signed)
Spoke to pt concerning [place] from 6.1.22. Denies questions or concerns regarding dx or treatment care plan. Encourage pt to call with needs. Received verbal understanding.

## 2020-07-17 ENCOUNTER — Telehealth: Payer: Self-pay | Admitting: Genetic Counselor

## 2020-07-17 ENCOUNTER — Encounter: Payer: Self-pay | Admitting: Genetic Counselor

## 2020-07-17 DIAGNOSIS — Z1379 Encounter for other screening for genetic and chromosomal anomalies: Secondary | ICD-10-CM | POA: Insufficient documentation

## 2020-07-17 NOTE — Telephone Encounter (Signed)
Contacted patient in attempt to disclose results of genetic testing.  LVM with contact information requesting a call back.  

## 2020-07-18 ENCOUNTER — Other Ambulatory Visit: Payer: Self-pay

## 2020-07-18 ENCOUNTER — Encounter (HOSPITAL_BASED_OUTPATIENT_CLINIC_OR_DEPARTMENT_OTHER): Payer: Self-pay | Admitting: Surgery

## 2020-07-18 NOTE — Telephone Encounter (Signed)
Revealed negative genetic testing.  Discussed that we do not know why she has breast cancer or why there is cancer in the family. It could be sporadic/familial, due to a different gene that we are not testing, or maybe our current technology may not be able to pick something up.  It will be important for her to keep in contact with genetics to keep up with whether additional testing may be needed.  Results of pan-cancer panel are pending.

## 2020-07-22 ENCOUNTER — Ambulatory Visit: Payer: Self-pay | Admitting: Genetic Counselor

## 2020-07-22 ENCOUNTER — Telehealth: Payer: Self-pay | Admitting: Genetic Counselor

## 2020-07-22 DIAGNOSIS — C50412 Malignant neoplasm of upper-outer quadrant of left female breast: Secondary | ICD-10-CM

## 2020-07-22 DIAGNOSIS — Z8041 Family history of malignant neoplasm of ovary: Secondary | ICD-10-CM

## 2020-07-22 DIAGNOSIS — Z800 Family history of malignant neoplasm of digestive organs: Secondary | ICD-10-CM

## 2020-07-22 DIAGNOSIS — Z803 Family history of malignant neoplasm of breast: Secondary | ICD-10-CM

## 2020-07-22 DIAGNOSIS — Z1379 Encounter for other screening for genetic and chromosomal anomalies: Secondary | ICD-10-CM

## 2020-07-22 NOTE — Telephone Encounter (Signed)
Revealed negative genetic testing of [place] [facility]-Expanded +RNAinsight.  Discussed that we do not know why she has breast cancer or why there is cancer in the family. It could be due to a different gene that we are not testing, or maybe our current technology may not be able to pick something up.  It will be important for her to keep in contact with genetics to keep up with whether additional testing may be needed.

## 2020-07-22 NOTE — Progress Notes (Signed)
HPI:  Ms. Ishibashi was previously seen in the [facility] [place] [facility] clinic due to a personal and family history of cancer and concerns regarding a hereditary predisposition to cancer. Please refer to our prior cancer genetics clinic note for more information regarding our discussion, assessment and recommendations, at the time. Ms. Brickner recent genetic test results were disclosed to her, as were recommendations warranted by these results. These results and recommendations are discussed in more detail below.  CANCER HISTORY:    In 2022, at the age of 65, Ms. Brecheisen was diagnosed with invasive ductal carcinoma of the left breast (ER+/PR+/HER2-). The preliminary treatment plan includes breast conserving surgery, Oncotype, adjuvant radiation, and anti-estrogens.   Oncology History  Malignant neoplasm of upper-outer quadrant of left breast in female, estrogen receptor positive ([place])  07/09/2020 Initial Diagnosis   Malignant neoplasm of upper-outer quadrant of left breast in female, estrogen receptor positive ([place])    07/10/2020 Cancer Staging   Staging form: Breast, AJCC 8th Edition - Clinical stage from 07/10/2020: Stage IA (cT1b, cN0, cM0, G1, ER+, PR+, HER2-) - Signed by [name], MD on 07/10/2020  Stage prefix: Initial diagnosis  Histologic grading system: 3 grade system    07/17/2020 Genetic Testing   Negative hereditary cancer genetic testing: no pathogenic variants detected in [facility] BRCAPlus Panel or [facility] CancerNext-Expanded +RNAinsight.  The report dates are July 17, 2020 and July 22, 2020, respectively.   The BRCAplus panel offered by [facility] and includes sequencing and deletion/duplication analysis for the following 8 genes: ATM, BRCA1, BRCA2, CDH1, CHEK2, PALB2, PTEN, and TP53.  The CancerNext-Expanded gene panel offered by [facility] and includes sequencing, rearrangement, and RNA analysis for the following 77 genes: AIP, ALK, APC, ATM, AXIN2, BAP1, BARD1, BLM,  BMPR1A, BRCA1, BRCA2, BRIP1, CDC73, CDH1, CDK4, CDKN1B, CDKN2A, CHEK2, CTNNA1, DICER1, FANCC, FH, FLCN, GALNT12, KIF1B, LZTR1, MAX, MEN1, MET, MLH1, MSH2, MSH3, MSH6, MUTYH, NBN, NF1, NF2, NTHL1, PALB2, PHOX2B, PMS2, POT1, PRKAR1A, PTCH1, PTEN, RAD51C, RAD51D, RB1, RECQL, RET, SDHA, SDHAF2, SDHB, SDHC, SDHD, SMAD4, SMARCA4, SMARCB1, SMARCE1, STK11, SUFU, TMEM127, TP53, TSC1, TSC2, VHL and XRCC2 (sequencing and deletion/duplication); EGFR, EGLN1, HOXB13, KIT, MITF, PDGFRA, POLD1, and POLE (sequencing only); EPCAM and GREM1 (deletion/duplication only).      FAMILY HISTORY:  We obtained a detailed, 4-generation family history.  Significant diagnoses are listed below: Family History  Problem Relation Age of Onset   Breast cancer Maternal Aunt        36   Breast cancer Other        PGM's sister; dx mid 72s   Colon cancer Maternal Aunt 69   Colon cancer Other        PGM's brother; dx 87s   Ovarian cancer Other        PGM's mother; dx unknown age    Ms. Bardon is unaware of previous family history of genetic testing for hereditary cancer risks. There is no reported Ashkenazi Jewish ancestry. There is no known consanguinity.  GENETIC TEST RESULTS: Genetic testing reported out on July 22, 2020.  The [facility] CancerNext-Expanded +RNAinsight Panel found no pathogenic mutations. The CancerNext-Expanded gene panel offered by [facility] and includes sequencing, rearrangement, and RNA analysis for the following 77 genes: AIP, ALK, APC, ATM, AXIN2, BAP1, BARD1, BLM, BMPR1A, BRCA1, BRCA2, BRIP1, CDC73, CDH1, CDK4, CDKN1B, CDKN2A, CHEK2, CTNNA1, DICER1, FANCC, FH, FLCN, GALNT12, KIF1B, LZTR1, MAX, MEN1, MET, MLH1, MSH2, MSH3, MSH6, MUTYH, NBN, NF1, NF2, NTHL1, PALB2, PHOX2B, PMS2, POT1, PRKAR1A, PTCH1, PTEN, RAD51C,  RAD51D, RB1, RECQL, RET, SDHA, SDHAF2, SDHB, SDHC, SDHD, SMAD4, SMARCA4, SMARCB1, SMARCE1, STK11, SUFU, TMEM127, TP53, TSC1, TSC2, VHL and XRCC2 (sequencing and deletion/duplication); EGFR, EGLN1,  HOXB13, KIT, MITF, PDGFRA, POLD1, and POLE (sequencing only); EPCAM and GREM1 (deletion/duplication only).   The test report has been scanned into EPIC and is located under the Molecular Pathology section of the Results Review tab.  A portion of the result report is included below for reference.    Ms. Aguado is unaware of previous family history of genetic testing for hereditary cancer risks. There is no reported Ashkenazi Jewish ancestry. There is no known consanguinity.  We discussed with Ms. Cutbirth that because current genetic testing is not perfect, it is possible there may be a gene mutation in one of these genes that current testing cannot detect, but that chance is small.  We also discussed, that there could be another gene that has not yet been discovered, or that we have not yet tested, that is responsible for the cancer diagnoses in the family. It is also possible there is a hereditary cause for the cancer in the family that Ms. Tokar did not inherit and therefore was not identified in her testing.  Therefore, it is important to remain in touch with cancer genetics in the future so that we can continue to offer Ms. Louks the most up to date genetic testing.   ADDITIONAL GENETIC TESTING: We discussed with Ms. Beckom that her genetic testing was fairly extensive.  If there are genes identified to increase cancer risk that can be analyzed in the future, we would be happy to discuss and coordinate this testing at that time.    CANCER SCREENING RECOMMENDATIONS: Ms. Pask test result is considered negative (normal).  This means that we have not identified a hereditary cause for her personal history of cancer at this time. Most cancers happen by chance and this negative test suggests that her cancer may fall into this category.    While reassuring, this does not definitively rule out a hereditary predisposition to cancer. It is still possible that there could be genetic mutations  that are undetectable by current technology. There could be genetic mutations in genes that have not been tested or identified to increase cancer risk.  Therefore, it is recommended she continue to follow the cancer management and screening guidelines provided by her oncology and primary healthcare provider.   An individual's cancer risk and medical management are not determined by genetic test results alone. Overall cancer risk assessment incorporates additional factors, including personal medical history, family history, and any available genetic information that may result in a personalized plan for cancer prevention and surveillance  RECOMMENDATIONS FOR FAMILY MEMBERS:  Individuals in this family might be at some increased risk of developing cancer, over the general population risk, simply due to the family history of cancer.  We recommended women in this family have a yearly mammogram beginning at age 19, or 69 years younger than the earliest onset of cancer, an annual clinical breast exam, and perform monthly breast self-exams. Women in this family should also have a gynecological exam as recommended by their primary provider.  First degree relatives of those with colon cancer should receive colonoscopies beginning at age 55, or 10 years prior to the earliest diagnosis of colon cancer in the family, and receive colonoscopies at least every 5 years, or as recommended by their gastroenterologist.    It is also possible there is a hereditary cause for the cancer  in Ms. Rappa family that she did not inherit and therefore was not identified in her.  Based on Ms. Goertz's family history, we recommend her maternal aunt, who was diagnosed with breast cancer at age 72, have genetic counseling and testing. Ms. Minkler can let us know if we can be of any assistance in coordinating genetic counseling and/or testing for this family member.   FOLLOW-UP: Lastly, we discussed with Ms. Scibilia that cancer  genetics is a rapidly advancing field and it is possible that new genetic tests will be appropriate for her and/or her family members in the future. We encouraged her to remain in contact with cancer genetics on an annual basis so we can update her personal and family histories and let her know of advances in cancer genetics that may benefit this family.   Our contact number was provided. Ms. Haltiwanger questions were answered to her satisfaction, and she knows she is welcome to call us at anytime with additional questions or concerns.     Oleg Oleson M. Joette Catching, [place], [facility] Genetic Counselor [email or web address] (P) [phone]

## 2020-07-23 ENCOUNTER — Other Ambulatory Visit: Payer: Self-pay

## 2020-07-23 ENCOUNTER — Encounter (HOSPITAL_BASED_OUTPATIENT_CLINIC_OR_DEPARTMENT_OTHER)
Admission: RE | Admit: 2020-07-23 | Discharge: 2020-07-23 | Disposition: A | Discharge status: Home / Self Care | Payer: Medicare Other | Source: Ambulatory Visit | Attending: Surgery | Admitting: Surgery

## 2020-07-23 DIAGNOSIS — Z01810 Encounter for preprocedural cardiovascular examination: Secondary | ICD-10-CM | POA: Insufficient documentation

## 2020-07-23 NOTE — Progress Notes (Signed)
EKG reviewed with Dr. Shellia Cleverly. Current BP 159/76, pt states that she feels ok and denies any SHOB or CP; also states that she has not yet taken her BP meds this morning. EKG cleared by MD. Pt free to go.

## 2020-07-25 ENCOUNTER — Other Ambulatory Visit: Payer: Self-pay

## 2020-07-25 ENCOUNTER — Ambulatory Visit
Admission: RE | Admit: 2020-07-25 | Discharge: 2020-07-25 | Disposition: A | Discharge status: Home / Self Care | Payer: Medicare Other | Source: Ambulatory Visit | Attending: Surgery | Admitting: Surgery

## 2020-07-25 DIAGNOSIS — Z853 Personal history of malignant neoplasm of breast: Secondary | ICD-10-CM

## 2020-07-25 NOTE — H&P (Signed)
Tammy [name] Appointment: 07/10/2020 1:00 PM Location: [facility] Patient #: [id number] DOB: 19-Mar-1955 Undefined / Language: Tammy [name] / Race: Black or African American Female   History of Present Illness (Tammy Sanagustin [name] MD; 07/10/2020 2:13 PM) The patient is a 65 year old female who presents with breast cancer. Chief complaint: Left breast cancer  This is a 65 year old female who was found on recent screening MRI.  A small mass in the outer quadrant of the left breast.  This measured approximately 0.9 cm in size.  Ultrasound of the axilla was unremarkable.  She underwent a biopsy of the mass showing invasive ductal carcinoma with DCIS as well.  It was 100% ER positive, 5% PR positive, HER2/neu negative, Ki-67 5%.  She has had no previous problems regarding her breast.  She has a strong family history of breast cancer.  She is otherwise without complaints.  She has had a CVA back in 2014 and is almost fully recovered.  She has no cardiopulmonary issues     PAST MEDICAL HISTORY:     Past Medical History:  Diagnosis Date   Arthritis     Bipolar disorder ([place])     Breast cancer ([place])     Depression     GERD (gastroesophageal reflux disease)     HTN (hypertension)     Stroke ([place]) 09/2012    slurred speech and left sided weakness/notes 10/04/2012      PAST SURGICAL HISTORY:      Past Surgical History:  Procedure Laterality Date   CESAREAN SECTION   1986   fracture of leg        2 months ago      FAMILY HISTORY:      Family History  Problem Relation Age of Onset   Breast cancer Mother     Other Mother          blood clots   Diabetes Brother     Neuropathy Brother          diabetic neuropathy   Diabetes Sister     Neuropathy Sister          diabetic neuropathy   Breast cancer Sister     Diabetes Paternal Aunt     Breast cancer Maternal Aunt     Breast cancer Paternal Aunt     Colon cancer Paternal Uncle    The patient's father died from rhabdomyolysis  at the age of 65.  The patient's mother died from congestive heart failure at the age of 71.  The patient has 3 brothers, 2 sisters, with no known history of cancer although one of her sisters is currently being evaluated by Dr. Burr Medico.  There are is 1 paternal aunt with breast cancer 1 maternal aunt with breast cancer and 1 maternal aunt with colon cancer all over the age of 32 at diagnosis     GYNECOLOGIC HISTORY:  No LMP recorded. Patient is postmenopausal. Menarche: 65 years old Age at first live birth: 65 years old [name] 1 LMP 40 Contraceptive oral contraceptives approximately 10 years with no complications HRT no  Hysterectomy? no BSO? 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  She is disabled secondary to her stroke.  She is divorced.  At home she lives with her Tammy [name] who is a retired Engineering geologist.  They have 2 cats.  The patient has an adopted daughter, [name]  [name], 49, who has a Oceanographer in social work and lives in [place], and daughter [name], 35, lives in [place] and is a Librarian, academic for the call a nurse system.  The patient has 2 grandchildren, 1 of whom is a Higher education careers adviser in [place], and 1 great granddaughter.  She attends a local [employer] church                          ADVANCED DIRECTIVES: Not in place.  At the 07/10/2020 visit the patient was given the appropriate documents to complete and notarized at her discretion.  She tells me she intends to name both her daughters as healthcare power of attorney     HEALTH MAINTENANCE: Social History         Tobacco Use   Smoking status: Former Smoker      Packs/day: 0.50      Years: 40.00      Pack years: 20.00      Types: Cigarettes   Smokeless tobacco: Never Used   Tobacco comment: has used e-cigarettes, no vaping  Scientific laboratory technician Use: Never used  Substance Use Topics   Alcohol use: Not Currently      Alcohol/week: 0.0  standard drinks      Comment: 1 per week   Drug use: No                  Colonoscopy: Never             PAP: 2017             Bone density: Never              No Known Allergies         Current Outpatient Medications  Medication Sig Dispense Refill   Acetaminophen (TYLENOL PO) Take by mouth as needed.       amLODipine (NORVASC) 10 MG tablet Take 10 mg by mouth daily.       atenolol (TENORMIN) 25 MG tablet Take 25 mg by mouth daily.       gabapentin (NEURONTIN) 300 MG capsule Take 1-2 capsules (300-600 mg total) by mouth 2 (two) times daily. 360 capsule 2   hydrochlorothiazide (HYDRODIURIL) 25 MG tablet Take 25 mg by mouth daily.       HYDROcodone-acetaminophen (NORCO/VICODIN) 5-325 MG tablet Take 1 tablet by mouth 3 (three) times daily as needed.       IBUPROFEN PO Take by mouth as needed.       lisinopril (ZESTRIL) 20 MG tablet Take 20 mg by mouth daily.       Multiple Vitamin (MULTIVITAMIN PO) Take by mouth daily.       pantoprazole (PROTONIX) 40 MG tablet Take 40 mg by mouth daily.        No current facility-administered medications for this visit.           Physical Exam (Leilana Mcquire [name] MD; 07/10/2020 2:14 PM) The physical exam findings are as follows: Note:  She appears well on exam  There is moderate ecchymosis of the left breast and a small hematoma from the biopsy. Otherwise, there are no masses. The nipple areolar complex is normal. There is no axillary adenopathy..  Lungs clear  CV RRR  Abdomen soft, NT  Neuro grossly intact  Assessment & Plan (Cayley Pester [name] MD; 07/10/2020 2:17 PM) INVASIVE DUCTAL CARCINOMA OF BREAST, LEFT (C50.912) Impression: I have reviewed her mammogram ultrasound, and pathology results. We  have also discussed her this morning multidisciplinary breast cancer conference. She has invasive and in situ ductal carcinoma breast. I discussed the diagnosis with pain in detail. We discussed surgical options regarding her breast. These  include lumpectomy versus mastectomy. She is interested in breast conservation. Long discussion regarding a radioactive seed guided left breast lumpectomy biopsy to evaluate lymph nodes for spread of disease. I discussed the surgical procedure with detail. I discussed the risks which includes but is not limited to bleeding, infection, the need for further surgery if margins or lymph nodes are positive, arm swelling, seroma formation, cardiopulmonary issues, postoperative recovery, etc. After discussion, she wished to proceed with surgery.

## 2020-07-26 ENCOUNTER — Ambulatory Visit (HOSPITAL_BASED_OUTPATIENT_CLINIC_OR_DEPARTMENT_OTHER): Payer: Medicare Other | Admitting: Anesthesiology

## 2020-07-26 ENCOUNTER — Ambulatory Visit (HOSPITAL_COMMUNITY)
Admission: RE | Admit: 2020-07-26 | Discharge: 2020-07-26 | Disposition: A | Discharge status: Home / Self Care | Payer: Medicare Other | Source: Ambulatory Visit | Attending: Surgery | Admitting: Surgery

## 2020-07-26 ENCOUNTER — Encounter (HOSPITAL_BASED_OUTPATIENT_CLINIC_OR_DEPARTMENT_OTHER): Payer: Self-pay | Admitting: Surgery

## 2020-07-26 ENCOUNTER — Encounter (HOSPITAL_BASED_OUTPATIENT_CLINIC_OR_DEPARTMENT_OTHER)
Admission: RE | Disposition: A | Discharge status: Home / Self Care | Payer: Self-pay | Source: Home / Self Care | Attending: Surgery

## 2020-07-26 ENCOUNTER — Other Ambulatory Visit: Payer: Self-pay

## 2020-07-26 ENCOUNTER — Ambulatory Visit
Admission: RE | Admit: 2020-07-26 | Discharge: 2020-07-26 | Disposition: A | Discharge status: Home / Self Care | Payer: Medicare Other | Source: Ambulatory Visit | Attending: Surgery | Admitting: Surgery

## 2020-07-26 ENCOUNTER — Ambulatory Visit (HOSPITAL_BASED_OUTPATIENT_CLINIC_OR_DEPARTMENT_OTHER)
Admission: RE | Admit: 2020-07-26 | Discharge: 2020-07-26 | Disposition: A | Discharge status: Home / Self Care | Payer: Medicare Other | Source: Home / Self Care | Attending: Surgery | Admitting: Surgery

## 2020-07-26 DIAGNOSIS — Z853 Personal history of malignant neoplasm of breast: Secondary | ICD-10-CM

## 2020-07-26 DIAGNOSIS — Z791 Long term (current) use of non-steroidal anti-inflammatories (NSAID): Secondary | ICD-10-CM | POA: Diagnosis not present

## 2020-07-26 DIAGNOSIS — I69354 Hemiplegia and hemiparesis following cerebral infarction affecting left non-dominant side: Secondary | ICD-10-CM | POA: Insufficient documentation

## 2020-07-26 DIAGNOSIS — C50912 Malignant neoplasm of unspecified site of left female breast: Secondary | ICD-10-CM | POA: Diagnosis present

## 2020-07-26 DIAGNOSIS — Z820 Family history of epilepsy and other diseases of the nervous system: Secondary | ICD-10-CM | POA: Insufficient documentation

## 2020-07-26 DIAGNOSIS — I69328 Other speech and language deficits following cerebral infarction: Secondary | ICD-10-CM | POA: Insufficient documentation

## 2020-07-26 DIAGNOSIS — Z8249 Family history of ischemic heart disease and other diseases of the circulatory system: Secondary | ICD-10-CM | POA: Insufficient documentation

## 2020-07-26 DIAGNOSIS — Z170 Estrogen receptor positive status [ER+]: Secondary | ICD-10-CM | POA: Insufficient documentation

## 2020-07-26 DIAGNOSIS — Z833 Family history of diabetes mellitus: Secondary | ICD-10-CM | POA: Insufficient documentation

## 2020-07-26 DIAGNOSIS — Z803 Family history of malignant neoplasm of breast: Secondary | ICD-10-CM | POA: Insufficient documentation

## 2020-07-26 DIAGNOSIS — C50411 Malignant neoplasm of upper-outer quadrant of right female breast: Secondary | ICD-10-CM | POA: Diagnosis not present

## 2020-07-26 DIAGNOSIS — Z79899 Other long term (current) drug therapy: Secondary | ICD-10-CM | POA: Insufficient documentation

## 2020-07-26 DIAGNOSIS — Z87891 Personal history of nicotine dependence: Secondary | ICD-10-CM | POA: Insufficient documentation

## 2020-07-26 DIAGNOSIS — C50812 Malignant neoplasm of overlapping sites of left female breast: Secondary | ICD-10-CM | POA: Insufficient documentation

## 2020-07-26 DIAGNOSIS — Z800 Family history of malignant neoplasm of digestive organs: Secondary | ICD-10-CM | POA: Insufficient documentation

## 2020-07-26 HISTORY — PX: BREAST LUMPECTOMY WITH RADIOACTIVE SEED AND SENTINEL LYMPH NODE BIOPSY: SHX6550

## 2020-07-26 SURGERY — BREAST LUMPECTOMY WITH RADIOACTIVE SEED AND SENTINEL LYMPH NODE BIOPSY
Anesthesia: Regional | Site: Breast | Laterality: Left

## 2020-07-26 MED ORDER — EPHEDRINE 5 MG/ML INJ
INTRAVENOUS | Status: AC
  Filled 2020-07-26: qty 10

## 2020-07-26 MED ORDER — HYDROMORPHONE HCL 1 MG/ML IJ SOLN
INTRAMUSCULAR | Status: AC
  Filled 2020-07-26: qty 0.5

## 2020-07-26 MED ORDER — SODIUM CHLORIDE (PF) 0.9 % IJ SOLN
INTRAMUSCULAR | Status: AC
  Filled 2020-07-26: qty 10

## 2020-07-26 MED ORDER — PROPOFOL 10 MG/ML IV BOLUS
INTRAVENOUS | Status: DC | PRN
  Administered 2020-07-26: 150 mg via INTRAVENOUS

## 2020-07-26 MED ORDER — LACTATED RINGERS IV SOLN: INTRAVENOUS | Status: DC

## 2020-07-26 MED ORDER — OXYCODONE HCL 5 MG/5ML PO SOLN: 5.0000 mg | Freq: Once | ORAL | Status: AC | PRN

## 2020-07-26 MED ORDER — BUPIVACAINE LIPOSOME 1.3 % IJ SUSP
INTRAMUSCULAR | Status: DC | PRN
  Administered 2020-07-26: 10 mL via PERINEURAL

## 2020-07-26 MED ORDER — BUPIVACAINE-EPINEPHRINE 0.5% -1:200000 IJ SOLN
INTRAMUSCULAR | Status: DC | PRN
  Administered 2020-07-26: 10 mL

## 2020-07-26 MED ORDER — PROPOFOL 10 MG/ML IV BOLUS
INTRAVENOUS | Status: AC
  Filled 2020-07-26: qty 20

## 2020-07-26 MED ORDER — BUPIVACAINE HCL (PF) 0.5 % IJ SOLN
INTRAMUSCULAR | Status: DC | PRN
  Administered 2020-07-26: 20 mL via PERINEURAL

## 2020-07-26 MED ORDER — CEFAZOLIN SODIUM-DEXTROSE 2-4 GM/100ML-% IV SOLN
INTRAVENOUS | Status: AC
  Filled 2020-07-26: qty 100

## 2020-07-26 MED ORDER — ONDANSETRON HCL 4 MG/2ML IJ SOLN
INTRAMUSCULAR | Status: AC
  Filled 2020-07-26: qty 2

## 2020-07-26 MED ORDER — SODIUM CHLORIDE (PF) 0.9 % IJ SOLN
INTRAVENOUS | Status: DC | PRN
  Administered 2020-07-26: 5 mL

## 2020-07-26 MED ORDER — ACETAMINOPHEN 500 MG PO TABS
ORAL_TABLET | ORAL | Status: AC
  Filled 2020-07-26: qty 1

## 2020-07-26 MED ORDER — ONDANSETRON HCL 4 MG/2ML IJ SOLN
INTRAMUSCULAR | Status: DC | PRN
  Administered 2020-07-26: 4 mg via INTRAVENOUS

## 2020-07-26 MED ORDER — FENTANYL CITRATE (PF) 100 MCG/2ML IJ SOLN
INTRAMUSCULAR | Status: AC
  Filled 2020-07-26: qty 2

## 2020-07-26 MED ORDER — CHLORHEXIDINE GLUCONATE CLOTH 2 % EX PADS: 6.0000 | MEDICATED_PAD | Freq: Once | CUTANEOUS | Status: DC

## 2020-07-26 MED ORDER — METHYLENE BLUE 0.5 % INJ SOLN
INTRAVENOUS | Status: AC
  Filled 2020-07-26: qty 10

## 2020-07-26 MED ORDER — CEFAZOLIN SODIUM-DEXTROSE 2-4 GM/100ML-% IV SOLN
2.0000 g | INTRAVENOUS | Status: AC
  Administered 2020-07-26: 2 g via INTRAVENOUS

## 2020-07-26 MED ORDER — OXYCODONE HCL 5 MG PO TABS
5.0000 mg | ORAL_TABLET | Freq: Once | ORAL | Status: AC | PRN
  Administered 2020-07-26: 5 mg via ORAL

## 2020-07-26 MED ORDER — MIDAZOLAM HCL 2 MG/2ML IJ SOLN
INTRAMUSCULAR | Status: AC
  Filled 2020-07-26: qty 2

## 2020-07-26 MED ORDER — EPHEDRINE SULFATE 50 MG/ML IJ SOLN
INTRAMUSCULAR | Status: DC | PRN
  Administered 2020-07-26: 10 mg via INTRAVENOUS

## 2020-07-26 MED ORDER — HYDROMORPHONE HCL 1 MG/ML IJ SOLN
0.2500 mg | INTRAMUSCULAR | Status: DC | PRN
  Administered 2020-07-26 (×2): 0.25 mg via INTRAVENOUS

## 2020-07-26 MED ORDER — ACETAMINOPHEN 500 MG PO TABS
1000.0000 mg | ORAL_TABLET | ORAL | Status: AC
  Administered 2020-07-26: 500 mg via ORAL

## 2020-07-26 MED ORDER — TRAMADOL HCL 50 MG PO TABS: 50.0000 mg | ORAL_TABLET | Freq: Four times a day (QID) | ORAL | 0 refills | Status: DC | PRN

## 2020-07-26 MED ORDER — FENTANYL CITRATE (PF) 100 MCG/2ML IJ SOLN
100.0000 ug | Freq: Once | INTRAMUSCULAR | Status: DC
Start: 2020-07-26 — End: 2020-07-26

## 2020-07-26 MED ORDER — LIDOCAINE HCL (CARDIAC) PF 100 MG/5ML IV SOSY
PREFILLED_SYRINGE | INTRAVENOUS | Status: DC | PRN
  Administered 2020-07-26: 60 mg via INTRAVENOUS

## 2020-07-26 MED ORDER — DEXAMETHASONE SODIUM PHOSPHATE 4 MG/ML IJ SOLN
INTRAMUSCULAR | Status: DC | PRN
  Administered 2020-07-26: 5 mg via INTRAVENOUS

## 2020-07-26 MED ORDER — PROMETHAZINE HCL 25 MG/ML IJ SOLN: 6.2500 mg | INTRAMUSCULAR | Status: DC | PRN

## 2020-07-26 MED ORDER — TECHNETIUM TC 99M TILMANOCEPT KIT
1.0000 | PACK | Freq: Once | INTRAVENOUS | Status: AC | PRN
  Administered 2020-07-26: 1 via INTRADERMAL

## 2020-07-26 MED ORDER — OXYCODONE HCL 5 MG PO TABS
ORAL_TABLET | ORAL | Status: AC
  Filled 2020-07-26: qty 1

## 2020-07-26 MED ORDER — ENSURE PRE-SURGERY PO LIQD: 296.0000 mL | Freq: Once | ORAL | Status: DC

## 2020-07-26 MED ORDER — MIDAZOLAM HCL 2 MG/2ML IJ SOLN: 2.0000 mg | Freq: Once | INTRAMUSCULAR | Status: DC

## 2020-07-26 MED ORDER — DEXAMETHASONE SODIUM PHOSPHATE 10 MG/ML IJ SOLN
INTRAMUSCULAR | Status: AC
  Filled 2020-07-26: qty 1

## 2020-07-26 SURGICAL SUPPLY — 48 items
ADH SKN CLS APL DERMABOND .7 (GAUZE/BANDAGES/DRESSINGS) ×1
APL PRP STRL LF DISP 70% ISPRP (MISCELLANEOUS) ×1
APPLIER CLIP 9.375 MED OPEN (MISCELLANEOUS) ×2
APR CLP MED 9.3 20 MLT OPN (MISCELLANEOUS) ×1
BLADE SURG 15 STRL LF DISP TIS (BLADE) ×1 IMPLANT
BLADE SURG 15 STRL SS (BLADE) ×2
CANISTER SUCT 1200ML W/VALVE (MISCELLANEOUS) ×1 IMPLANT
CHLORAPREP W/TINT 26 (MISCELLANEOUS) ×2 IMPLANT
CLIP APPLIE 9.375 MED OPEN (MISCELLANEOUS) ×1 IMPLANT
CLIP VESOCCLUDE SM WIDE 6/CT (CLIP) IMPLANT
COVER BACK TABLE 60X90IN (DRAPES) ×2 IMPLANT
COVER MAYO STAND STRL (DRAPES) ×2 IMPLANT
COVER PROBE W GEL 5X96 (DRAPES) ×2 IMPLANT
COVER WAND RF STERILE (DRAPES) IMPLANT
DECANTER SPIKE VIAL GLASS SM (MISCELLANEOUS) IMPLANT
DERMABOND ADVANCED (GAUZE/BANDAGES/DRESSINGS) ×1
DERMABOND ADVANCED .7 DNX12 (GAUZE/BANDAGES/DRESSINGS) ×1 IMPLANT
DRAPE LAPAROSCOPIC ABDOMINAL (DRAPES) ×2 IMPLANT
DRAPE UTILITY XL STRL (DRAPES) ×2 IMPLANT
ELECT REM PT RETURN 9FT ADLT (ELECTROSURGICAL) ×2
ELECTRODE REM PT RTRN 9FT ADLT (ELECTROSURGICAL) ×1 IMPLANT
GAUZE SPONGE 4X4 12PLY STRL LF (GAUZE/BANDAGES/DRESSINGS) IMPLANT
GLOVE SURG ENC MOIS LTX SZ6.5 (GLOVE) ×1 IMPLANT
GLOVE SURG SIGNA 7.5 PF LTX (GLOVE) ×2 IMPLANT
GLOVE SURG UNDER POLY LF SZ7 (GLOVE) ×2 IMPLANT
GOWN STRL REUS W/ TWL LRG LVL3 (GOWN DISPOSABLE) ×1 IMPLANT
GOWN STRL REUS W/ TWL XL LVL3 (GOWN DISPOSABLE) ×1 IMPLANT
GOWN STRL REUS W/TWL LRG LVL3 (GOWN DISPOSABLE)
GOWN STRL REUS W/TWL XL LVL3 (GOWN DISPOSABLE) ×4
KIT MARKER MARGIN INK (KITS) ×2 IMPLANT
NDL HYPO 25X1 1.5 SAFETY (NEEDLE) ×1 IMPLANT
NDL SAFETY ECLIPSE 18X1.5 (NEEDLE) ×1 IMPLANT
NEEDLE HYPO 18GX1.5 SHARP (NEEDLE)
NEEDLE HYPO 25X1 1.5 SAFETY (NEEDLE) ×4 IMPLANT
NS IRRIG 1000ML POUR BTL (IV SOLUTION) ×2 IMPLANT
PACK BASIN DAY SURGERY FS (CUSTOM PROCEDURE TRAY) ×2 IMPLANT
PENCIL SMOKE EVACUATOR (MISCELLANEOUS) ×2 IMPLANT
SLEEVE SCD COMPRESS KNEE MED (STOCKING) ×2 IMPLANT
SPONGE LAP 4X18 RFD (DISPOSABLE) ×2 IMPLANT
SUT MNCRL AB 4-0 PS2 18 (SUTURE) ×2 IMPLANT
SUT SILK 2 0 SH (SUTURE) IMPLANT
SUT VIC AB 3-0 SH 27 (SUTURE) ×2
SUT VIC AB 3-0 SH 27X BRD (SUTURE) ×1 IMPLANT
SYR CONTROL 10ML LL (SYRINGE) ×3 IMPLANT
TOWEL GREEN STERILE FF (TOWEL DISPOSABLE) ×2 IMPLANT
TRAY FAXITRON CT DISP (TRAY / TRAY PROCEDURE) ×2 IMPLANT
TUBE CONNECTING 20X1/4 (TUBING) ×1 IMPLANT
YANKAUER SUCT BULB TIP NO VENT (SUCTIONS) ×1 IMPLANT

## 2020-07-26 NOTE — Progress Notes (Signed)
Nuc med inj performed by nuc med staff. Pt tol well with no additional sedation. VSS, emotional support provided.

## 2020-07-26 NOTE — Anesthesia Postprocedure Evaluation (Signed)
Anesthesia Post Note  Patient: Tammy [name]  Procedure(s) Performed: LEFT BREAST LUMPECTOMY WITH RADIOACTIVE SEED AND SENTINEL LYMPH NODE BIOPSY (Left: Breast)     Patient location during evaluation: PACU Anesthesia Type: Regional and General Level of consciousness: awake and alert, oriented and patient cooperative Pain management: pain level controlled Vital Signs Assessment: post-procedure vital signs reviewed and stable Respiratory status: spontaneous breathing, nonlabored ventilation and respiratory function stable Cardiovascular status: blood pressure returned to baseline and stable Postop Assessment: no apparent nausea or vomiting Anesthetic complications: no   No notable events documented.  Last Vitals:  Vitals:   07/26/20 1000 07/26/20 1015  BP: (!) 148/69 (!) 130/59  Pulse: 69 66  Resp: 20 15  Temp:    SpO2: 97% 99%    Last Pain:  Vitals:   07/26/20 1015  TempSrc:   PainSc: [name]

## 2020-07-26 NOTE — Anesthesia Preprocedure Evaluation (Addendum)
Anesthesia Evaluation  Patient identified by MRN, date of birth, ID band Patient awake    Reviewed: Allergy & Precautions, NPO status , Patient's Chart, lab work & pertinent test results, reviewed documented beta blocker date and time   Airway Mallampati: II  TM Distance: >3 FB Neck ROM: Full    Dental  (+) Partial Lower, Partial Upper,    Pulmonary Current Smoker and Patient abstained from smoking.,  1ppd x 20 years, quit 1 week ago   Pulmonary exam normal breath sounds clear to auscultation       Cardiovascular hypertension, Pt. on medications and Pt. on home beta blockers Normal cardiovascular exam Rhythm:Regular Rate:Normal     Neuro/Psych PSYCHIATRIC DISORDERS Anxiety Depression Bipolar Disorder CVA (L sided weakness,slurred speech- 2014), Residual Symptoms    GI/Hepatic Neg liver ROS, GERD  Medicated and Controlled,  Endo/Other  negative endocrine ROS  Renal/GU negative Renal ROS  negative genitourinary   Musculoskeletal  (+) Arthritis , Osteoarthritis,    Abdominal   Peds negative pediatric ROS (+)  Hematology negative hematology ROS (+)   Anesthesia Other Findings L breast ca   Reproductive/Obstetrics negative OB ROS                             Anesthesia Physical Anesthesia Plan  ASA: 2  Anesthesia Plan: General and Regional   Post-op Pain Management: GA combined w/ Regional for post-op pain   Induction: Intravenous  PONV Risk Score and Plan: 2 and Ondansetron, Dexamethasone, Midazolam and Treatment may vary due to age or medical condition  Airway Management Planned: LMA  Additional Equipment: None  Intra-op Plan:   Post-operative Plan: Extubation in OR  Informed Consent: I have reviewed the patients History and Physical, chart, labs and discussed the procedure including the risks, benefits and alternatives for the proposed anesthesia with the patient or authorized  representative who has indicated his/her understanding and acceptance.     Dental advisory given  Plan Discussed with: CRNA  Anesthesia Plan Comments:        Anesthesia Quick Evaluation

## 2020-07-26 NOTE — Discharge Instructions (Addendum)
[place] Office Phone Number [phone]  BREAST BIOPSY/ PARTIAL MASTECTOMY: POST OP INSTRUCTIONS  Always review your discharge instruction sheet given to you by the facility where your surgery was performed.  IF YOU HAVE DISABILITY OR FAMILY LEAVE FORMS, YOU MUST BRING THEM TO THE OFFICE FOR PROCESSING.  DO NOT GIVE THEM TO YOUR DOCTOR.  A prescription for pain medication may be given to you upon discharge.  Take your pain medication as prescribed, if needed.  If narcotic pain medicine is not needed, then you may take acetaminophen (Tylenol) or ibuprofen (Advil) as needed. Take your usually prescribed medications unless otherwise directed If you need a refill on your pain medication, please contact your pharmacy.  They will contact our office to request authorization.  Prescriptions will not be filled after 5pm or on week-ends. You should eat very light the first 24 hours after surgery, such as soup, crackers, pudding, etc.  Resume your normal diet the day after surgery. Most patients will experience some swelling and bruising in the breast.  Ice packs and a good support bra will help.  Swelling and bruising can take several days to resolve.  It is common to experience some constipation if taking pain medication after surgery.  Increasing fluid intake and taking a stool softener will usually help or prevent this problem from occurring.  A mild laxative (Milk of Magnesia or Miralax) should be taken according to package directions if there are no bowel movements after 48 hours. Unless discharge instructions indicate otherwise, you may remove your bandages 24-48 hours after surgery, and you may shower at that time.  You may have steri-strips (small skin tapes) in place directly over the incision.  These strips should be left on the skin for 7-10 days.  If your surgeon used skin glue on the incision, you may shower in 24 hours.  The glue will flake off over the next 2-3 weeks.  Any  sutures or staples will be removed at the office during your follow-up visit. ACTIVITIES:  You may resume regular daily activities (gradually increasing) beginning the next day.  Wearing a good support bra or sports bra minimizes pain and swelling.  You may have sexual intercourse when it is comfortable. You may drive when you no longer are taking prescription pain medication, you can comfortably wear a seatbelt, and you can safely maneuver your car and apply brakes. RETURN TO WORK:  ______________________________________________________________________________________ Dennis Bast should see your doctor in the office for a follow-up appointment approximately two weeks after your surgery.  Your doctor's nurse will typically make your follow-up appointment when she calls you with your pathology report.  Expect your pathology report 2-3 business days after your surgery.  You may call to check if you do not hear from [place] after three days. OTHER INSTRUCTIONS: OK TO SHOWER STARTING TOMORROW ICE PACK, TYLENOL, AND IBUPROFEN FOR PAIN ALSO NO VIGOROUS ACTIVITY FOR ONE WEEK _______________________________________________________________________________________________ _____________________________________________________________________________________________________________________________________ _____________________________________________________________________________________________________________________________________ _____________________________________________________________________________________________________________________________________  WHEN TO CALL YOUR DOCTOR: Fever over 101.0 Nausea and/or vomiting. Extreme swelling or bruising. Continued bleeding from incision. Increased pain, redness, or drainage from the incision.  The clinic staff is available to answer your questions during regular business hours.  Please don't hesitate to call and ask to speak to one of the nurses for clinical  concerns.  If you have a medical emergency, go to the nearest emergency room or call 911.  A surgeon from [facility] is always on call at the hospital.  For further questions, please visit  [email or web address]    May take Tylenol after 1:40pm, if needed  Post Anesthesia Home Care Instructions  Activity: Get plenty of rest for the remainder of the day. A responsible individual must stay with you for 24 hours following the procedure.  For the next 24 hours, DO NOT: -Drive a car -Paediatric nurse -Drink alcoholic beverages -Take any medication unless instructed by your physician -Make any legal decisions or sign important papers.  Meals: Start with liquid foods such as gelatin or soup. Progress to regular foods as tolerated. Avoid greasy, spicy, heavy foods. If nausea and/or vomiting occur, drink only clear liquids until the nausea and/or vomiting subsides. Call your physician if vomiting continues.  Special Instructions/Symptoms: Your throat may feel dry or sore from the anesthesia or the breathing tube placed in your throat during surgery. If this causes discomfort, gargle with warm salt water. The discomfort should disappear within 24 hours.  If you had a scopolamine patch placed behind your ear for the management of post- operative nausea and/or vomiting:  1. The medication in the patch is effective for 72 hours, after which it should be removed.  Wrap patch in a tissue and discard in the trash. Wash hands thoroughly with soap and water. 2. You may remove the patch earlier than 72 hours if you experience unpleasant side effects which may include dry mouth, dizziness or visual disturbances. 3. Avoid touching the patch. Wash your hands with soap and water after contact with the patch.

## 2020-07-26 NOTE — Op Note (Signed)
   Tammy [name] 07/26/2020   Pre-op Diagnosis: LEFT BREAST CANCER     Post-op Diagnosis: same  Procedure(s): LEFT BREAST LUMPECTOMY WITH RADIOACTIVE SEED GUIDANCE LEFT BREAST DEEP AXILLARY SENTINEL NODE BIOPSY INJECTION OF BLUE DYE  Surgeon(s): [name], MD  Anesthesia: General  Staff:  Circulator: Ted Mcalpine, RN Scrub Person: Charisse March, RN  Estimated Blood Loss: Minimal               Specimens: sent to path  Indications: This is a 65 year old female recently diagnosed with a left breast mass.  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  The decision was made to proceed with a radioactive seed guided left breast lumpectomy and sentinel lymph node biopsy  Procedure: The patient was identified in the preoperative holding area.  Radioactive isotope was then injected around the areola by the radiation technologist.  She was then taken to the operating room.  She is placed upon the operating table and general anesthesia was induced.  I evaluated the axilla on the left side with the neoprobe and found no uptake of radioactive isotope.  I thus injected blue dye underneath the nipple areolar complex of the left breast and massaged the breast.  Her left breast axilla were then prepped and draped in usual sterile fashion.  The radioactive seed was located with the neoprobe in the upper outer quadrant of the right breast near the axilla.  I elected to make an incision in the axilla after anesthetized skin with a Marcaine.  I then dissected down to the breast tissue with the cautery.  I next dissected medially toward the radioactive seed with the aid of the neoprobe.  Hematoma had been created.  I completely undermined the skin over the top of the hematoma and seed.  I then performed a wide lumpectomy with the aid of the neoprobe removing the breast tissue around the radioactive seed.  There was a lot of firm calcified tissue.  Once  the specimen was removed I marked all margins with paint.  An x-ray was performed confirming that the radioactive seed and previous tissue marker in the specimen.  It was then sent to pathology for evaluation.  With the aid of the neoprobe and the blue dye I was able to identify 2 sentinel lymph nodes with uptake of blue dye with 1 also up taking radioactive isotope.  A third additional lymph node was also removed.  I performed this with the cautery as well as surgical clips.  The lymph nodes were sent to pathology for evaluation.  I could palpate no other enlarged lymph nodes and there was no other increased uptake of radioactive isotope.  At this point hemostasis appeared to be achieved.  I placed surgical clips around the biopsy cavity for marking purposes.  I then closed the subcutaneous tissue with interrupted 3-0 Vicryl sutures and closed the skin with running 4-0 Monocryl.  Dermabond was then applied.  The patient tolerated the procedure well.  All the counts were correct at the end of the procedure.  The patient was then extubated in the operating room and taken in stable condition to the recovery room.          [name]   Date: 07/26/2020  Time: 9:49 AM

## 2020-07-26 NOTE — Anesthesia Procedure Notes (Signed)
Anesthesia Regional Block: Pectoralis block   Pre-Anesthetic Checklist: , timeout performed,  Correct Patient, Correct Site, Correct Laterality,  Correct Procedure, Correct Position, site marked,  Risks and benefits discussed,  Surgical consent,  Pre-op evaluation,  At surgeon's request and post-op pain management  Laterality: Left  Prep: Maximum Sterile Barrier Precautions used, chloraprep       Needles:  Injection technique: Single-shot  Needle Type: Echogenic Stimulator Needle     Needle Length: 9cm  Needle Gauge: 22     Additional Needles:   Procedures:,,,, ultrasound used (permanent image in chart),,    Narrative:  Start time: 07/26/2020 8:15 AM End time: 07/26/2020 8:20 AM Injection made incrementally with aspirations every 5 mL.  Performed by: Personally  Anesthesiologist: Pervis Hocking, DO  Additional Notes: Monitors applied. No increased pain on injection. No increased resistance to injection. Injection made in 5cc increments. Good needle visualization. Patient tolerated procedure well.

## 2020-07-26 NOTE — Transfer of Care (Signed)
Immediate Anesthesia Transfer of Care Note  Patient: Tammy [name]  Procedure(s) Performed: LEFT BREAST LUMPECTOMY WITH RADIOACTIVE SEED AND SENTINEL LYMPH NODE BIOPSY (Left: Breast)  Patient Location: PACU  Anesthesia Type:GA combined with regional for post-op pain  Level of Consciousness: awake and alert   Airway & Oxygen Therapy: Patient Spontanous Breathing and Patient connected to face mask oxygen  Post-op Assessment: Report given to RN and Post -op Vital signs reviewed and stable  Post vital signs: Reviewed and stable  Last Vitals:  Vitals Value Taken Time  BP 131/70 07/26/20 0953  Temp    Pulse 73 07/26/20 0955  Resp 23 07/26/20 0955  SpO2 95 % 07/26/20 0955  Vitals shown include unvalidated device data.  Last Pain:  Vitals:   07/26/20 0732  TempSrc: Oral  PainSc: 0-No pain      Patients Stated Pain Goal: 5 (88/89/16 9450)  Complications: No notable events documented.

## 2020-07-26 NOTE — Progress Notes (Signed)
Assisted Dr. Doroteo Glassman with left, ultrasound guided, pectoralis block. Side rails up, monitors on throughout procedure. See vital signs in flow sheet. Tolerated Procedure well.

## 2020-07-26 NOTE — Anesthesia Procedure Notes (Signed)
Procedure Name: LMA Insertion Date/Time: 07/26/2020 9:03 AM Performed by: Tawni Millers, CRNA Pre-anesthesia Checklist: Patient identified, Emergency Drugs available, Suction available and Patient being monitored Patient Re-evaluated:Patient Re-evaluated prior to induction Oxygen Delivery Method: Circle system utilized Preoxygenation: Pre-oxygenation with 100% oxygen Induction Type: IV induction Ventilation: Mask ventilation without difficulty LMA: LMA inserted LMA Size: 4.0 Number of attempts: 1 Airway Equipment and Method: Bite block Placement Confirmation: positive ETCO2 Tube secured with: Tape Dental Injury: Teeth and Oropharynx as per pre-operative assessment

## 2020-07-26 NOTE — Interval H&P Note (Signed)
History and Physical Interval Note:no change in H and P  07/26/2020 7:15 AM  Tammy [name]  has presented today for surgery, with the diagnosis of LEFT BREAST CANCER.  The various methods of treatment have been discussed with the patient and family. After consideration of risks, benefits and other options for treatment, the patient has consented to  Procedure(s): LEFT BREAST LUMPECTOMY WITH RADIOACTIVE SEED AND SENTINEL LYMPH NODE BIOPSY (Left) as a surgical intervention.  The patient's history has been reviewed, patient examined, no change in status, stable for surgery.  I have reviewed the patient's chart and labs.  Questions were answered to the patient's satisfaction.     [name]

## 2020-07-28 ENCOUNTER — Encounter (HOSPITAL_BASED_OUTPATIENT_CLINIC_OR_DEPARTMENT_OTHER): Payer: Self-pay | Admitting: Surgery

## 2020-07-29 LAB — SURGICAL PATHOLOGY

## 2020-07-31 ENCOUNTER — Encounter: Payer: Self-pay | Admitting: *Deleted

## 2020-08-15 ENCOUNTER — Encounter: Payer: Self-pay | Admitting: *Deleted

## 2020-08-19 ENCOUNTER — Encounter: Payer: Self-pay | Admitting: *Deleted

## 2020-08-19 ENCOUNTER — Encounter: Payer: Self-pay | Admitting: Physical Therapy

## 2020-08-19 ENCOUNTER — Other Ambulatory Visit: Payer: Self-pay

## 2020-08-19 ENCOUNTER — Ambulatory Visit: Payer: Medicare Other | Attending: Surgery | Admitting: Physical Therapy

## 2020-08-19 ENCOUNTER — Telehealth: Payer: Self-pay | Admitting: *Deleted

## 2020-08-19 DIAGNOSIS — C50412 Malignant neoplasm of upper-outer quadrant of left female breast: Secondary | ICD-10-CM

## 2020-08-19 DIAGNOSIS — Z170 Estrogen receptor positive status [ER+]: Secondary | ICD-10-CM | POA: Insufficient documentation

## 2020-08-19 DIAGNOSIS — R293 Abnormal posture: Secondary | ICD-10-CM | POA: Insufficient documentation

## 2020-08-19 DIAGNOSIS — Z483 Aftercare following surgery for neoplasm: Secondary | ICD-10-CM | POA: Insufficient documentation

## 2020-08-19 NOTE — Telephone Encounter (Signed)
Received oncotype score of 16. Physician team notified. Called pt with results and discussed chemo not recommended and next step is xrt with Dr. [name]. Received verbal understanding. Referral placed for Dr. [name].

## 2020-08-19 NOTE — Patient Instructions (Signed)
            [facility]         [address], [place] [address]         [phone]         Annia Friendly, PT, CLT   After Breast Cancer Class It is recommended you attend the ABC class to be educated on lymphedema risk reduction. This class is free of charge and lasts for 1 hour. It is a 1-time class.  You are scheduled for July 18th at 11:00. We will send you a link and you need to have the Webex appt to open the link.  Scar massage You can begin using coconut oil to soften the scar and do some massage to reduce the hardness - a few minutes each day.   Compression garment You would benefit from a good compression bra. You can go to [employer] [facility] [employer] and they can fit you.   Home exercise Program You should continue your exercises until you complete radiation.   Follow up PT: It is recommended you return every 3 months for the first 3 years following surgery to be assessed on the SOZO machine for an L-Dex score. This helps prevent clinically significant lymphedema in 95% of patients. These follow up screens are 10 minute appointments that you are not billed for. WE ARE SCHEDULED TO MOVE TO [place] November 11, 2020. APPOINTMENTS FOR SOZO SCREENS AFTER 11/11/2020 WILL BE LOCATED AT [facility] AT [address] [place] [address]. Please call us to confirm we have moved if your appointment is scheduled after October 3rd, 2022. The phone number is [phone]. You are scheduled for September 26th at 2:40pm.

## 2020-08-19 NOTE — Therapy (Signed)
[place], [place], [address] Phone: [phone]   Fax:  [phone]  Physical Therapy Treatment  Patient Details  Name: Tammy [name] MRN: [id number] Date of Birth: 10-25-1955 Referring Provider (PT): Dr. [name]   Encounter Date: 08/19/2020   PT End of Session - 08/19/20 1047     Visit Number 2    Number of Visits 2    PT Start Time 1005    PT Stop Time 7544    PT Time Calculation (min) 30 min    Activity Tolerance Patient tolerated treatment well    Behavior During Therapy [facility] for tasks assessed/performed             Past Medical History:  Diagnosis Date   Arthritis    Bipolar disorder ([place])    Breast cancer ([place])    Depression    Family history of breast cancer 07/10/2020   Family history of colon cancer 07/10/2020   Family history of ovarian cancer 07/10/2020   GERD (gastroesophageal reflux disease)    HTN (hypertension)    Stroke ([place]) 09/2012   slurred speech and left sided weakness/notes 10/04/2012    Past Surgical History:  Procedure Laterality Date   BREAST LUMPECTOMY WITH RADIOACTIVE SEED AND SENTINEL LYMPH NODE BIOPSY Left 07/26/2020   Procedure: LEFT BREAST LUMPECTOMY WITH RADIOACTIVE SEED AND SENTINEL LYMPH NODE BIOPSY;  Surgeon: [name], MD;  Location: [facility];  Service: General;  Laterality: Left;   [place]   fracture of leg     2 months ago    There were no vitals filed for this visit.   Subjective Assessment - 08/19/20 1009     Subjective Patient underwent a left lumpectomy and sentinel node bipsy (3 negative nodes) on 07/26/2020. No chemo needed but will do radiation and anti-estrogen therapy.    Pertinent History Patient was diagnosed on 06/05/2020 with left grade I invasive ductal carcinoma breast cancer. She underwent a left lumpectomy and sentinel node bipsy (3 negative nodes) on 07/26/2020. It is ER/PR  positive and HER2 negative with a Ki67 of 5%.    Patient Stated Goals See how my arm is    Multiple Pain Sites No                [facility] PT Assessment - 08/19/20 0001       Assessment   Medical Diagnosis s/p left lumpectomy and SLNB    Referring Provider (PT) Dr. [name]    Onset Date/Surgical Date 07/26/20    Hand Dominance Right    Prior Therapy Baselines      Precautions   Precautions Other (comment)    Precaution Comments recent srugery; left arm lymphedema risk      Restrictions   Weight Bearing Restrictions No      Balance Screen   Has the patient fallen in the past 6 months No    Has the patient had a decrease in activity level because of a fear of falling?  No    Is the patient reluctant to leave their home because of a fear of falling?  No      Home Environment   Living Environment Private residence    Living Arrangements Alone    Available Help at Discharge Family      Prior Function   Level of [place] Retired    Leisure She is walking daily 10 minutes  Cognition   Overall Cognitive Status Within Functional Limits for tasks assessed      Observation/Other Assessments   Observations Significant hardness present around scar tissue with mild edema presetn in left upper outer breast. She is wearing a loose fitting bra.      Posture/Postural Control   Posture/Postural Control Postural limitations    Postural Limitations Rounded Shoulders;Forward head      ROM / Strength   AROM / PROM / Strength AROM      AROM   AROM Assessment Site Shoulder    Right/Left Shoulder Left    Left Shoulder Extension 54 Degrees    Left Shoulder Flexion 157 Degrees    Left Shoulder ABduction 157 Degrees    Left Shoulder Internal Rotation 80 Degrees    Left Shoulder External Rotation 82 Degrees      Strength   Overall Strength Within functional limits for tasks performed               LYMPHEDEMA/ONCOLOGY QUESTIONNAIRE -  08/19/20 0001       Type   Cancer Type Left breast cancer      Surgeries   Lumpectomy Date 07/26/20    Sentinel Lymph Node Biopsy Date 07/26/20    Number Lymph Nodes Removed 3      Treatment   Active Chemotherapy Treatment No    Past Chemotherapy Treatment No    Active Radiation Treatment No    Past Radiation Treatment No    Current Hormone Treatment No    Past Hormone Therapy No      What other symptoms do you have   Are you Having Heaviness or Tightness No    Are you having Pain No    Are you having pitting edema No    Is it Hard or Difficult finding clothes that fit No    Do you have infections No    Is there Decreased scar mobility No    [place] Sign No      Lymphedema Assessments   Lymphedema Assessments Upper extremities      Right Upper Extremity Lymphedema   10 cm Proximal to Olecranon Process 25.4 cm    Olecranon Process 22.5 cm    10 cm Proximal to Ulnar Styloid Process 19.8 cm    Just Proximal to Ulnar Styloid Process 14 cm    Across Hand at [employer] 17.9 cm    At [place] of 2nd Digit 5.7 cm      Left Upper Extremity Lymphedema   10 cm Proximal to Olecranon Process 27.3 cm    Olecranon Process 23.6 cm    10 cm Proximal to Ulnar Styloid Process 18.8 cm    Just Proximal to Ulnar Styloid Process 13.9 cm    Across Hand at [employer] 17.3 cm    At [place] of 2nd Digit 5.2 cm                Quick Dash - 08/19/20 0001     Open a tight or new jar Moderate difficulty    Do heavy household chores (wash walls, wash floors) Unable    Carry a shopping bag or briefcase Moderate difficulty    Wash your back Mild difficulty    Use a knife to cut food No difficulty    Recreational activities in which you take some force or impact through your arm, shoulder, or hand (golf, hammering, tennis) Unable    During the past week, to what extent has  your arm, shoulder or hand problem interfered with your normal social activities with family, friends, neighbors,  or groups? Not at all    During the past week, to what extent has your arm, shoulder or hand problem limited your work or other regular daily activities Not at all    Arm, shoulder, or hand pain. None    Tingling (pins and needles) in your arm, shoulder, or hand None    Difficulty Sleeping No difficulty    DASH Score 29.55 %                            PT Education - 08/19/20 1047     Education Details Aftercare, lymphedema info; scar massage    Person(s) Educated Patient;Child(ren)    Methods Explanation;Demonstration;Handout    Comprehension Returned demonstration;Verbalized understanding                 PT Long Term Goals - 08/19/20 1050       PT LONG TERM GOAL #1   Title Patient will demonstrate she has regained full shoulder ROM and function post operatively compared to baselines.    Time 8    Period Weeks    Status Achieved                   Plan - 08/19/20 1048     Clinical Impression Statement Patient is doing well s/p left lumpectomy and sentinel node biopsy (3 negative nodes) on 07/26/2020. She has regained shoulder ROM and function, shows no signs of lymphedema and her incision is healing well. She has significant scar tissue present and will benefit from doing home scar massage. Otherwise she has no PT needs at this time. Gave her information for obtaining a compression bra.    PT Treatment/Interventions ADLs/Self Care Home Management;Therapeutic exercise;Patient/family education    PT Next Visit Plan D/C    PT Home Exercise Plan Post op shoulder ROM HEP    Consulted and Agree with Plan of Care Patient;Family member/caregiver    Family Member Consulted daughter             Patient will benefit from skilled therapeutic intervention in order to improve the following deficits and impairments:  Postural dysfunction, Decreased range of motion, Decreased knowledge of precautions, Impaired UE functional use, Pain  Visit  Diagnosis: Malignant neoplasm of upper-outer quadrant of left breast in female, estrogen receptor positive ([place])  Abnormal posture  Aftercare following surgery for neoplasm     Problem List Patient Active Problem List   Diagnosis Date Noted   Genetic testing 07/17/2020   Family history of breast cancer 07/10/2020   Family history of ovarian cancer 07/10/2020   Family history of colon cancer 07/10/2020   Malignant neoplasm of upper-outer quadrant of left breast in female, estrogen receptor positive ([place]) 07/09/2020   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 See above for objective measurements.   Remaining deficits: Scar tissue present   Education / Equipment: HEP and lymphedema education  Patient agrees to discharge.  Patient goals were met. Patient is being discharged due to meeting the stated rehab goals. Annia Friendly, [place] 08/19/20 10:53 AM   [place], [place], [address] Phone: [phone]   Fax:  [phone]  Name: Tammy [name] MRN: [id number] Date of Birth: 1955/08/08

## 2020-08-20 ENCOUNTER — Encounter (HOSPITAL_COMMUNITY): Payer: Self-pay

## 2020-08-21 ENCOUNTER — Ambulatory Visit
Admission: RE | Admit: 2020-08-21 | Discharge: 2020-08-21 | Disposition: A | Discharge status: Home / Self Care | Payer: Medicare Other | Source: Ambulatory Visit | Attending: Radiation Oncology | Admitting: Radiation Oncology

## 2020-08-21 ENCOUNTER — Encounter: Payer: Self-pay | Admitting: Radiation Oncology

## 2020-08-21 ENCOUNTER — Ambulatory Visit: Payer: Medicare Other | Admitting: Physical Therapy

## 2020-08-21 ENCOUNTER — Other Ambulatory Visit: Payer: Self-pay

## 2020-08-21 VITALS — BP 171/88 | HR 61 | Resp 18 | Wt 164.5 lb

## 2020-08-21 DIAGNOSIS — Z510 Encounter for antineoplastic radiation therapy: Secondary | ICD-10-CM | POA: Insufficient documentation

## 2020-08-21 DIAGNOSIS — K219 Gastro-esophageal reflux disease without esophagitis: Secondary | ICD-10-CM | POA: Insufficient documentation

## 2020-08-21 DIAGNOSIS — Z800 Family history of malignant neoplasm of digestive organs: Secondary | ICD-10-CM | POA: Diagnosis not present

## 2020-08-21 DIAGNOSIS — F17210 Nicotine dependence, cigarettes, uncomplicated: Secondary | ICD-10-CM | POA: Insufficient documentation

## 2020-08-21 DIAGNOSIS — Z803 Family history of malignant neoplasm of breast: Secondary | ICD-10-CM | POA: Insufficient documentation

## 2020-08-21 DIAGNOSIS — C50412 Malignant neoplasm of upper-outer quadrant of left female breast: Secondary | ICD-10-CM

## 2020-08-21 DIAGNOSIS — Z8673 Personal history of transient ischemic attack (TIA), and cerebral infarction without residual deficits: Secondary | ICD-10-CM | POA: Insufficient documentation

## 2020-08-21 DIAGNOSIS — I10 Essential (primary) hypertension: Secondary | ICD-10-CM | POA: Insufficient documentation

## 2020-08-21 DIAGNOSIS — Z170 Estrogen receptor positive status [ER+]: Secondary | ICD-10-CM | POA: Insufficient documentation

## 2020-08-21 DIAGNOSIS — Z79899 Other long term (current) drug therapy: Secondary | ICD-10-CM | POA: Insufficient documentation

## 2020-08-21 DIAGNOSIS — Z8041 Family history of malignant neoplasm of ovary: Secondary | ICD-10-CM | POA: Diagnosis not present

## 2020-08-21 NOTE — Progress Notes (Addendum)
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 Vitals:   08/21/20 1323  BP: (!) 171/88  Pulse: 61  Resp: 18  SpO2: 100%  Weight: 74.6 kg  Patients blood pressure was elevated denies having any headaches ,chest pain or any  other s/s of hypertension. States that she has taken her blood pressure medication today.  Vitals:   08/21/20 1323  BP: (!) 171/88  Pulse: 61  Resp: 18  SpO2: 100%  Weight: 74.6 kg

## 2020-08-21 NOTE — Progress Notes (Signed)
Radiation Oncology         ([phone]) [phone] ________________________________  Name: Tammy [name]        MRN: [id number]  Date of Service: 08/21/2020 DOB: 11/21/1955  WU:[id number], Sami, MD  Magrinat, [name], MD     REFERRING PHYSICIAN: Magrinat, [name], MD   DIAGNOSIS: The encounter diagnosis was Malignant neoplasm of upper-outer quadrant of left breast in female, estrogen receptor positive ([place]).   HISTORY OF PRESENT ILLNESS: Tammy Denherder is a 65 y.o. female originally seen in the multidisciplinary breast clinic for a new diagnosis of left breast cancer. The patient was noted to have a screening detected mass in the left breast at the 1:00 position. The site measured 9 mm and her axilla was negative for adenopathy by diagnostic imaging. 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 Her cancer was ER/PR positive, Her2 was negative with a Ki 67 of 5%.   Since her last visit she has undergone left lumpectomy on 07/26/2020 which revealed a grade 1 invasive ductal carcinoma measuring 8 mm.  Her margins were negative for carcinoma and 3 sampled lymph nodes were also negative.  Her tumor was also tested for Oncotype which showed a score of 16.  No systemic chemotherapy is recommended based on this result.  She is seen today to discuss adjuvant radiotherapy.     PREVIOUS RADIATION THERAPY: No   PAST MEDICAL HISTORY:  Past Medical History:  Diagnosis Date   Arthritis    Bipolar disorder ([place])    Breast cancer ([place])    Depression    Family history of breast cancer 07/10/2020   Family history of colon cancer 07/10/2020   Family history of ovarian cancer 07/10/2020   GERD (gastroesophageal reflux disease)    HTN (hypertension)    Stroke ([place]) 09/2012   slurred speech and left sided weakness/notes 10/04/2012       PAST SURGICAL HISTORY: Past Surgical History:  Procedure Laterality Date   BREAST LUMPECTOMY WITH RADIOACTIVE SEED  AND SENTINEL LYMPH NODE BIOPSY Left 07/26/2020   Procedure: LEFT BREAST LUMPECTOMY WITH RADIOACTIVE SEED AND SENTINEL LYMPH NODE BIOPSY;  Surgeon: [name], MD;  Location: [facility];  Service: General;  Laterality: Left;   CESAREAN SECTION  1986   fracture of leg     2 months ago     FAMILY HISTORY:  Family History  Problem Relation Age of Onset   Other Mother        blood clots   Diabetes Brother    Neuropathy Brother        diabetic neuropathy   Diabetes Sister    Neuropathy Sister        diabetic neuropathy   Diabetes Paternal Aunt    Breast cancer Maternal Aunt        40   Breast cancer Other        PGM's sister; dx mid 23s   Colon cancer Maternal Aunt 69   Colon cancer Other        PGM's brother; dx 5s   Ovarian cancer Other        PGM's mother; dx unknown age     SOCIAL HISTORY:  reports that she has been smoking cigarettes. She has a 20.00 pack-year smoking history. She has never used smokeless tobacco. She reports previous alcohol use. She reports that she does not use drugs.  The patient is engaged and lives in [place]. She is a retired  nurse.    ALLERGIES: Patient has no known allergies.   MEDICATIONS:  Current Outpatient Medications  Medication Sig Dispense Refill   Acetaminophen (TYLENOL PO) Take by mouth as needed.     amLODipine (NORVASC) 10 MG tablet Take 10 mg by mouth daily.     atenolol (TENORMIN) 25 MG tablet Take 25 mg by mouth daily.     gabapentin (NEURONTIN) 300 MG capsule Take 1-2 capsules (300-600 mg total) by mouth 2 (two) times daily. 360 capsule 2   hydrochlorothiazide (HYDRODIURIL) 25 MG tablet Take 25 mg by mouth daily.     HYDROcodone-acetaminophen (NORCO/VICODIN) 5-325 MG tablet Take 1 tablet by mouth 3 (three) times daily as needed.     IBUPROFEN PO Take by mouth as needed.     lisinopril (ZESTRIL) 20 MG tablet Take 20 mg by mouth daily.     Multiple Vitamin (MULTIVITAMIN PO) Take by mouth daily.      pantoprazole (PROTONIX) 40 MG tablet Take 40 mg by mouth daily.     traMADol (ULTRAM) 50 MG tablet Take 1-2 tablets (50-100 mg total) by mouth every 6 (six) hours as needed. 25 tablet 0   No current facility-administered medications for this encounter.     REVIEW OF SYSTEMS: On review of systems, the patient reports that she is doing very well since surgery, she denies any significant concerns about her healing at this time.     PHYSICAL EXAM:  Wt Readings from Last 3 Encounters:  07/26/20 164 lb 14.5 oz (74.8 kg)  07/10/20 161 lb 4.8 oz (73.2 kg)  05/17/19 157 lb (71.2 kg)   Temp Readings from Last 3 Encounters:  07/26/20 98 F (36.7 C)  07/10/20 (!) 97.5 F (36.4 C) (Tympanic)  05/17/19 (!) 96.9 F (36.1 C)   BP Readings from Last 3 Encounters:  07/26/20 (!) 168/78  07/10/20 (!) 197/86  05/17/19 138/78   Pulse Readings from Last 3 Encounters:  07/26/20 60  07/10/20 69  05/17/19 76    In general this is a well appearing African-American female in no acute distress. She's alert and oriented x4 and appropriate throughout the examination. Cardiopulmonary assessment is negative for acute distress and she exhibits normal effort.  Left breast exam reveals a well-healed incision site.  No erythema is identified, there is eschar along her incision however.   ECOG = 0  0 - Asymptomatic (Fully active, able to carry on all predisease activities without restriction)  1 - Symptomatic but completely ambulatory (Restricted in physically strenuous activity but ambulatory and able to carry out work of a light or sedentary nature. For example, light housework, office work)  2 - Symptomatic, <50% in bed during the day (Ambulatory and capable of all self care but unable to carry out any work activities. Up and about more than 50% of waking hours)  3 - Symptomatic, >50% in bed, but not bedbound (Capable of only limited self-care, confined to bed or chair 50% or more of waking hours)  4 -  Bedbound (Completely disabled. Cannot carry on any self-care. Totally confined to bed or chair)  5 - Death   Eustace Pen MM, Creech RH, Tormey DC, et al. [phone]). "Toxicity and response criteria of the [facility]". Becker Oncol. 5 (6): 649-55    LABORATORY DATA:  Lab Results  Component Value Date   WBC 5.9 07/10/2020   HGB 11.4 (L) 07/10/2020   HCT 35.7 (L) 07/10/2020   MCV 87.1 07/10/2020   PLT 213  07/10/2020   Lab Results  Component Value Date   NA 142 07/10/2020   K 4.1 07/10/2020   CL 105 07/10/2020   CO2 26 07/10/2020   Lab Results  Component Value Date   ALT 14 07/10/2020   AST 16 07/10/2020   ALKPHOS 103 07/10/2020   BILITOT 0.3 07/10/2020      RADIOGRAPHY: NM Sentinel Node Inj-No Rpt (Breast)  Result Date: 07/26/2020 Sulfur Colloid was injected by the Nuclear Medicine Technologist for sentinel lymph node localization.   MM Breast Surgical Specimen  Result Date: 07/26/2020 CLINICAL DATA:  Status post seed localized LEFT lumpectomy. EXAM: SPECIMEN RADIOGRAPH OF THE LEFT BREAST COMPARISON:  Previous exam(s). FINDINGS: Status post excision of the left breast. The radioactive seed and ribbon shaped biopsy marker clip are present and intact. The findings are discussed with the operating room nurse at the time of interpretation. IMPRESSION: Specimen radiograph of the left breast. Electronically Signed   By: Nolon Nations M.D.   On: 07/26/2020 09:46  MM LT RADIOACTIVE SEED LOC MAMMO GUIDE  Result Date: 07/25/2020 CLINICAL DATA:  Radioactive seed localization of the left breast prior to lumpectomy. EXAM: MAMMOGRAPHIC GUIDED RADIOACTIVE SEED LOCALIZATION OF THE LEFT BREAST COMPARISON:  Previous exam(s). FINDINGS: Patient presents for radioactive seed localization prior to left breast lumpectomy. I met with the patient and we discussed the procedure of seed localization including benefits and alternatives. We discussed the high likelihood of a  successful procedure. We discussed the risks of the procedure including infection, bleeding, tissue injury and further surgery. We discussed the low dose of radioactivity involved in the procedure. Informed, written consent was given. The usual time-out protocol was performed immediately prior to the procedure. Using mammographic guidance, sterile technique, 1% lidocaine and an I-125 radioactive seed, the ribbon shaped biopsy marking clip in the upper-outer left breast was localized using a superior approach. The follow-up mammogram images confirm the seed in the expected location and were marked for Dr. [name]. Follow-up survey of the patient confirms presence of the radioactive seed. Order number of I-125 seed:  [id number]. Total activity:  8.144 millicuries reference Date: 06/28/2020 The patient tolerated the procedure well and was released from the [facility]. She was given instructions regarding seed removal. IMPRESSION: Radioactive seed localization left breast. No apparent complications. Electronically Signed   By: Ammie Ferrier M.D.   On: 07/25/2020 15:36       IMPRESSION/PLAN: 1. Stage IA, pT1bN0M0, grade 1, ER/PR positive of the left breast.  Dr. [name] reviews the final results of her surgical pathology as well as her Oncotype DX score.  Fortunately no additional systemic therapy is necessary.  She remains a good candidate for external radiotherapy to the breast  to reduce risks of local recurrence followed by antiestrogen therapy. We discussed the risks, benefits, short, and long term effects of radiotherapy, as well as the curative intent, and the patient is interested in proceeding. Dr. [name] discusses the delivery and logistics of radiotherapy and recommends 4 weeks of radiotherapy to the left breast with deep inspiration breath-hold technique. Written consent is obtained and placed in the chart, a copy was provided to the patient.  She will simulate this afternoon.   In a visit lasting  45 minutes, greater than 50% of the time was spent face to face reviewing her case, as well as in preparation of, discussing, and coordinating the patient's care.  The above documentation reflects my direct findings during this shared patient visit. Please see the separate note  by Dr. [name] on this date for the remainder of the patient's plan of care.    Carola Rhine, [facility]    **Disclaimer: This note was dictated with voice recognition software. Similar sounding words can inadvertently be transcribed and this note may contain transcription errors which may not have been corrected upon publication of note.**

## 2020-08-26 ENCOUNTER — Encounter: Payer: Self-pay | Admitting: *Deleted

## 2020-08-27 DIAGNOSIS — Z510 Encounter for antineoplastic radiation therapy: Secondary | ICD-10-CM | POA: Diagnosis not present

## 2020-08-28 ENCOUNTER — Ambulatory Visit
Admission: RE | Admit: 2020-08-28 | Discharge: 2020-08-28 | Disposition: A | Discharge status: Home / Self Care | Payer: Medicare Other | Source: Ambulatory Visit | Attending: Radiation Oncology | Admitting: Radiation Oncology

## 2020-08-28 ENCOUNTER — Other Ambulatory Visit: Payer: Self-pay

## 2020-08-28 DIAGNOSIS — Z510 Encounter for antineoplastic radiation therapy: Secondary | ICD-10-CM | POA: Diagnosis not present

## 2020-08-29 ENCOUNTER — Ambulatory Visit
Admission: RE | Admit: 2020-08-29 | Discharge: 2020-08-29 | Disposition: A | Discharge status: Home / Self Care | Payer: Medicare Other | Source: Ambulatory Visit | Attending: Radiation Oncology | Admitting: Radiation Oncology

## 2020-08-29 DIAGNOSIS — Z510 Encounter for antineoplastic radiation therapy: Secondary | ICD-10-CM | POA: Diagnosis not present

## 2020-08-29 NOTE — Progress Notes (Signed)
Pt here for patient teaching.  Pt given Radiation and You booklet, skin care instructions, Alra deodorant, and Radiaplex gel.  Reviewed areas of pertinence such as fatigue, hair loss, skin changes, breast tenderness, and breast swelling . Pt able to give teach back of to pat skin and use unscented/gentle soap,apply Radiaplex bid, avoid applying anything to skin within 4 hours of treatment, avoid wearing an under wire bra, and to use an electric razor if they must shave. Pt verbalizes understanding of information given and will contact nursing with any questions or concerns.     Http://rtanswers.org/treatmentinformation/whattoexpect/index  Aleea Hendry M. Salima Rumer RN, BSN       

## 2020-08-30 ENCOUNTER — Ambulatory Visit
Admission: RE | Admit: 2020-08-30 | Discharge: 2020-08-30 | Disposition: A | Discharge status: Home / Self Care | Payer: Medicare Other | Source: Ambulatory Visit | Attending: Radiation Oncology | Admitting: Radiation Oncology

## 2020-08-30 ENCOUNTER — Other Ambulatory Visit: Payer: Self-pay

## 2020-08-30 DIAGNOSIS — Z170 Estrogen receptor positive status [ER+]: Secondary | ICD-10-CM

## 2020-08-30 DIAGNOSIS — Z510 Encounter for antineoplastic radiation therapy: Secondary | ICD-10-CM | POA: Diagnosis not present

## 2020-08-30 DIAGNOSIS — C50412 Malignant neoplasm of upper-outer quadrant of left female breast: Secondary | ICD-10-CM

## 2020-08-30 MED ORDER — RADIAPLEXRX EX GEL: Freq: Once | CUTANEOUS | Status: AC

## 2020-08-30 MED ORDER — ALRA NON-METALLIC DEODORANT (RAD-ONC)
1.0000 | Freq: Once | TOPICAL | Status: AC
Start: 2020-08-30 — End: 2020-08-30
  Administered 2020-08-30: 1 via TOPICAL

## 2020-09-02 ENCOUNTER — Other Ambulatory Visit: Payer: Self-pay

## 2020-09-02 ENCOUNTER — Ambulatory Visit
Admission: RE | Admit: 2020-09-02 | Discharge: 2020-09-02 | Disposition: A | Discharge status: Home / Self Care | Payer: Medicare Other | Source: Ambulatory Visit | Attending: Radiation Oncology | Admitting: Radiation Oncology

## 2020-09-02 DIAGNOSIS — Z510 Encounter for antineoplastic radiation therapy: Secondary | ICD-10-CM | POA: Diagnosis not present

## 2020-09-03 ENCOUNTER — Ambulatory Visit
Admission: RE | Admit: 2020-09-03 | Discharge: 2020-09-03 | Disposition: A | Discharge status: Home / Self Care | Payer: Medicare Other | Source: Ambulatory Visit | Attending: Radiation Oncology | Admitting: Radiation Oncology

## 2020-09-03 ENCOUNTER — Encounter: Payer: Self-pay | Admitting: Oncology

## 2020-09-03 DIAGNOSIS — Z510 Encounter for antineoplastic radiation therapy: Secondary | ICD-10-CM | POA: Diagnosis not present

## 2020-09-04 ENCOUNTER — Ambulatory Visit: Admission: RE | Admit: 2020-09-04 | Payer: Medicare Other | Source: Ambulatory Visit

## 2020-09-04 ENCOUNTER — Ambulatory Visit: Payer: Medicare Other

## 2020-09-04 ENCOUNTER — Other Ambulatory Visit: Payer: Self-pay

## 2020-09-05 ENCOUNTER — Ambulatory Visit
Admission: RE | Admit: 2020-09-05 | Discharge: 2020-09-05 | Disposition: A | Discharge status: Home / Self Care | Payer: Medicare Other | Source: Ambulatory Visit | Attending: Radiation Oncology | Admitting: Radiation Oncology

## 2020-09-05 DIAGNOSIS — Z510 Encounter for antineoplastic radiation therapy: Secondary | ICD-10-CM | POA: Diagnosis not present

## 2020-09-06 ENCOUNTER — Other Ambulatory Visit: Payer: Self-pay

## 2020-09-06 ENCOUNTER — Ambulatory Visit
Admission: RE | Admit: 2020-09-06 | Discharge: 2020-09-06 | Disposition: A | Discharge status: Home / Self Care | Payer: Medicare Other | Source: Ambulatory Visit | Attending: Radiation Oncology | Admitting: Radiation Oncology

## 2020-09-06 DIAGNOSIS — Z510 Encounter for antineoplastic radiation therapy: Secondary | ICD-10-CM | POA: Diagnosis not present

## 2020-09-09 ENCOUNTER — Other Ambulatory Visit: Payer: Self-pay

## 2020-09-09 ENCOUNTER — Ambulatory Visit
Admission: RE | Admit: 2020-09-09 | Discharge: 2020-09-09 | Disposition: A | Discharge status: Home / Self Care | Payer: Medicare Other | Source: Ambulatory Visit | Attending: Radiation Oncology | Admitting: Radiation Oncology

## 2020-09-09 DIAGNOSIS — Z510 Encounter for antineoplastic radiation therapy: Secondary | ICD-10-CM | POA: Insufficient documentation

## 2020-09-09 DIAGNOSIS — Z170 Estrogen receptor positive status [ER+]: Secondary | ICD-10-CM | POA: Insufficient documentation

## 2020-09-09 DIAGNOSIS — C50412 Malignant neoplasm of upper-outer quadrant of left female breast: Secondary | ICD-10-CM | POA: Insufficient documentation

## 2020-09-10 ENCOUNTER — Ambulatory Visit
Admission: RE | Admit: 2020-09-10 | Discharge: 2020-09-10 | Disposition: A | Discharge status: Home / Self Care | Payer: Medicare Other | Source: Ambulatory Visit | Attending: Radiation Oncology | Admitting: Radiation Oncology

## 2020-09-10 DIAGNOSIS — C50412 Malignant neoplasm of upper-outer quadrant of left female breast: Secondary | ICD-10-CM | POA: Diagnosis not present

## 2020-09-11 ENCOUNTER — Ambulatory Visit
Admission: RE | Admit: 2020-09-11 | Discharge: 2020-09-11 | Disposition: A | Discharge status: Home / Self Care | Payer: Medicare Other | Source: Ambulatory Visit | Attending: Radiation Oncology | Admitting: Radiation Oncology

## 2020-09-11 ENCOUNTER — Other Ambulatory Visit: Payer: Self-pay

## 2020-09-11 DIAGNOSIS — C50412 Malignant neoplasm of upper-outer quadrant of left female breast: Secondary | ICD-10-CM | POA: Diagnosis not present

## 2020-09-12 ENCOUNTER — Ambulatory Visit
Admission: RE | Admit: 2020-09-12 | Discharge: 2020-09-12 | Disposition: A | Discharge status: Home / Self Care | Payer: Medicare Other | Source: Ambulatory Visit | Attending: Radiation Oncology | Admitting: Radiation Oncology

## 2020-09-12 DIAGNOSIS — C50412 Malignant neoplasm of upper-outer quadrant of left female breast: Secondary | ICD-10-CM | POA: Diagnosis not present

## 2020-09-13 ENCOUNTER — Ambulatory Visit: Payer: Medicare Other | Admitting: Radiation Oncology

## 2020-09-13 ENCOUNTER — Ambulatory Visit
Admission: RE | Admit: 2020-09-13 | Discharge: 2020-09-13 | Disposition: A | Discharge status: Home / Self Care | Payer: Medicare Other | Source: Ambulatory Visit | Attending: Radiation Oncology | Admitting: Radiation Oncology

## 2020-09-13 DIAGNOSIS — C50412 Malignant neoplasm of upper-outer quadrant of left female breast: Secondary | ICD-10-CM | POA: Diagnosis not present

## 2020-09-16 ENCOUNTER — Other Ambulatory Visit: Payer: Self-pay

## 2020-09-16 ENCOUNTER — Ambulatory Visit
Admission: RE | Admit: 2020-09-16 | Discharge: 2020-09-16 | Disposition: A | Discharge status: Home / Self Care | Payer: Medicare Other | Source: Ambulatory Visit | Attending: Radiation Oncology | Admitting: Radiation Oncology

## 2020-09-16 DIAGNOSIS — C50412 Malignant neoplasm of upper-outer quadrant of left female breast: Secondary | ICD-10-CM | POA: Diagnosis not present

## 2020-09-17 ENCOUNTER — Encounter: Payer: Self-pay | Admitting: Radiation Oncology

## 2020-09-17 ENCOUNTER — Ambulatory Visit
Admission: RE | Admit: 2020-09-17 | Discharge: 2020-09-17 | Disposition: A | Discharge status: Home / Self Care | Payer: Medicare Other | Source: Ambulatory Visit | Attending: Radiation Oncology | Admitting: Radiation Oncology

## 2020-09-17 DIAGNOSIS — C50412 Malignant neoplasm of upper-outer quadrant of left female breast: Secondary | ICD-10-CM | POA: Diagnosis not present

## 2020-09-18 ENCOUNTER — Other Ambulatory Visit: Payer: Self-pay

## 2020-09-18 ENCOUNTER — Ambulatory Visit
Admission: RE | Admit: 2020-09-18 | Discharge: 2020-09-18 | Disposition: A | Discharge status: Home / Self Care | Payer: Medicare Other | Source: Ambulatory Visit | Attending: Radiation Oncology | Admitting: Radiation Oncology

## 2020-09-18 ENCOUNTER — Ambulatory Visit: Payer: Medicare Other | Admitting: Radiation Oncology

## 2020-09-18 DIAGNOSIS — C50412 Malignant neoplasm of upper-outer quadrant of left female breast: Secondary | ICD-10-CM | POA: Diagnosis not present

## 2020-09-19 ENCOUNTER — Ambulatory Visit
Admission: RE | Admit: 2020-09-19 | Discharge: 2020-09-19 | Disposition: A | Discharge status: Home / Self Care | Payer: Medicare Other | Source: Ambulatory Visit | Attending: Radiation Oncology | Admitting: Radiation Oncology

## 2020-09-19 ENCOUNTER — Ambulatory Visit: Payer: Medicare Other

## 2020-09-19 DIAGNOSIS — C50412 Malignant neoplasm of upper-outer quadrant of left female breast: Secondary | ICD-10-CM | POA: Diagnosis not present

## 2020-09-20 ENCOUNTER — Ambulatory Visit
Admission: RE | Admit: 2020-09-20 | Discharge: 2020-09-20 | Disposition: A | Discharge status: Home / Self Care | Payer: Medicare Other | Source: Ambulatory Visit | Attending: Radiation Oncology | Admitting: Radiation Oncology

## 2020-09-20 DIAGNOSIS — C50412 Malignant neoplasm of upper-outer quadrant of left female breast: Secondary | ICD-10-CM | POA: Diagnosis not present

## 2020-09-23 ENCOUNTER — Ambulatory Visit
Admission: RE | Admit: 2020-09-23 | Discharge: 2020-09-23 | Disposition: A | Discharge status: Home / Self Care | Payer: Medicare Other | Source: Ambulatory Visit | Attending: Radiation Oncology | Admitting: Radiation Oncology

## 2020-09-23 DIAGNOSIS — C50412 Malignant neoplasm of upper-outer quadrant of left female breast: Secondary | ICD-10-CM | POA: Diagnosis not present

## 2020-09-24 ENCOUNTER — Encounter: Payer: Self-pay | Admitting: *Deleted

## 2020-09-24 ENCOUNTER — Ambulatory Visit: Payer: Medicare Other

## 2020-09-24 ENCOUNTER — Ambulatory Visit
Admission: RE | Admit: 2020-09-24 | Discharge: 2020-09-24 | Disposition: A | Discharge status: Home / Self Care | Payer: Medicare Other | Source: Ambulatory Visit | Attending: Radiation Oncology | Admitting: Radiation Oncology

## 2020-09-24 ENCOUNTER — Other Ambulatory Visit: Payer: Self-pay

## 2020-09-24 DIAGNOSIS — C50412 Malignant neoplasm of upper-outer quadrant of left female breast: Secondary | ICD-10-CM

## 2020-09-25 ENCOUNTER — Encounter: Payer: Self-pay | Admitting: Radiation Oncology

## 2020-09-25 ENCOUNTER — Ambulatory Visit
Admission: RE | Admit: 2020-09-25 | Discharge: 2020-09-25 | Disposition: A | Discharge status: Home / Self Care | Payer: Medicare Other | Source: Ambulatory Visit | Attending: Radiation Oncology | Admitting: Radiation Oncology

## 2020-09-25 DIAGNOSIS — C50412 Malignant neoplasm of upper-outer quadrant of left female breast: Secondary | ICD-10-CM | POA: Diagnosis not present

## 2020-09-30 NOTE — Progress Notes (Signed)
                                                                                                                                                             Patient Name: Tammy [name] MRN: [id number] DOB: 09-02-1955 Referring Physician: Lurline Del (Profile Not Attached) Date of Service: 09/25/2020 [facility] [place]                                                        End Of Treatment Note  Diagnoses: C50.412-Malignant neoplasm of upper-outer quadrant of left female breast  Cancer Staging: Stage IA, pT1bN0M0, grade 1, ER/PR positive of the left breast.   Intent: Curative  Radiation Treatment Dates: 08/28/2020 through 09/25/2020 Site Technique Total Dose (Gy) Dose per Fx (Gy) Completed Fx Beam Energies  Breast, Left: Breast_Lt 3D 42.56/42.56 2.66 16/16 6XFFF, 10XFFF  Breast, Left: Breast_Lt_Bst 3D 8/8 2 4/4 6X, 10X   Narrative: The patient tolerated radiation therapy relatively well with anticipated skin changes in the treatment field and mild fatigue.  Plan: The patient will receive a call in about one month from the radiation oncology department. She will continue follow up with Dr. Jana Hakim as well.   ________________________________________________    Carola Rhine, [facility]

## 2020-10-03 NOTE — Progress Notes (Signed)
  Radiation Oncology         [phone]) [phone] ________________________________  Name: Tammy [name] MRN: [id number]  Date: 09/17/2020  DOB: April 03, 1955  SIMULATION NOTE   NARRATIVE:  The patient underwent simulation today for ongoing radiation therapy.  The existing CT study set was employed for the purpose of virtual treatment planning.  The target and avoidance structures were reviewed and modified as necessary.  Treatment planning then occurred.  The radiation boost prescription was entered and confirmed.  A total of 3 complex treatment devices were fabricated in the form of multi-leaf collimators to shape radiation around the targets while maximally excluding nearby normal structures. I have requested : Isodose Plan.    PLAN:  This modified radiation beam arrangement is intended to continue the current radiation dose to an additional 8 Gy in 4 fractions for a total cumulative dose of 50.56 Gy.    ------------------------------------------------  [name], MD, PhD

## 2020-10-20 NOTE — Progress Notes (Signed)
Tammy [name]  [phone] [phone]     ID: Tammy [name] DOB: [birth year]  MR#: [id number]  AJO#:[id number]  Patient Care Team: [name], MD as PCP - General (Internal Medicine) [name], RN as Oncology Nurse Navigator [name], RN as Oncology Nurse Navigator [name], MD as Consulting Physician (General Surgery) [name], [name], MD as Consulting Physician (Oncology) [name], MD as Consulting Physician (Radiation Oncology) [name], MD as Consulting Physician (Neurology) [name], MD OTHER MD:  CHIEF COMPLAINT: Estrogen receptor positive breast cancer  CURRENT TREATMENT: To start anastrozole   INTERVAL HISTORY: Tammy [name] returns today for follow up of her estrogen receptor positive breast cancer. She was evaluated in the multidisciplinary breast cancer clinic on 07/10/2020.  She is accompanied by her husband Tammy [name]  She underwent genetic testing during clinic. Results were negative.  She proceeded to left lumpectomy with sentinel lymph node biopsy on 07/26/2020 under Dr. [name]. Pathology from the procedure [id number]) showed: invasive ductal carcinoma, grade 1, 0.8 cm.  The three biopsied lymph nodes were all negative for carcinoma (0/3).  Oncotype DX was obtained on the final surgical sample and the recurrence score of 16 predicts a risk of recurrence outside the breast over the next 9 years of 4%, if the patient's only systemic therapy is an antiestrogen for 5 years.  It also predicts no benefit from chemotherapy.   She was referred back to Dr. [name] on 08/21/2020 to review radiation therapy. She subsequently received treatment from 08/28/2020 through 09/25/2020.   REVIEW OF SYSTEMS: Sian did remarkably well with her surgery and initially did well with radiation but towards the end she was getting tired and she has not quite recovered.  Despite that she is walking about 30 minutes  most days which is terrific.  She does not have significant soreness sensitivity or shooting pains in the surgical breast.  A detailed review of systems was otherwise stable.   COVID 19 VACCINATION STATUS: fully vaccinated [employer]), with booster 12/2019   HISTORY OF CURRENT ILLNESS: From the original intake note:  Tammy [name] had routine screening mammography on 06/05/2020 showing a possible abnormality in the left breast. She underwent left diagnostic mammography with tomography and left breast ultrasonography at [facility] on 06/27/2020 showing: breast density category C; 9 mm left breast mass at 1 o'clock; no enlarged or morphologically abnormal lymph nodes.  Accordingly on 07/02/2020 she proceeded to biopsy of the left breast area in question. The pathology from this procedure ([id number]) showed: invasive ductal carcinoma, grade 1; ductal carcinoma in situ. Prognostic indicators significant for: estrogen receptor, 100% positive and progesterone receptor, 5% positive, both with strong staining intensity. Proliferation marker Ki67 at 5%. HER2 equivocal by immunohistochemistry (2+), but negative by fluorescent in situ hybridization with a signals ratio 1.24 and number per cell 1.8.  Cancer Staging Malignant neoplasm of upper-outer quadrant of left breast in female, estrogen receptor positive ([place]) Staging form: Breast, AJCC 8th Edition - Clinical stage from 07/10/2020: Stage IA (cT1b, cN0, cM0, G1, ER+, PR+, HER2-) - Signed by [name], MD on 07/10/2020 Stage prefix: Initial diagnosis Histologic grading system: 3 grade system  The patient's subsequent history is as detailed below.   PAST MEDICAL HISTORY: Past Medical History:  Diagnosis Date   Arthritis    Bipolar disorder ([place])    Breast cancer ([place])    Depression    Family history of breast cancer 07/10/2020   Family history of  colon cancer 07/10/2020   Family history of ovarian cancer 07/10/2020   GERD  (gastroesophageal reflux disease)    HTN (hypertension)    Stroke ([facility]) 09/2012   slurred speech and left sided weakness/notes 10/04/2012    PAST SURGICAL HISTORY: Past Surgical History:  Procedure Laterality Date   BREAST LUMPECTOMY WITH RADIOACTIVE SEED AND SENTINEL LYMPH NODE BIOPSY Left 07/26/2020   Procedure: LEFT BREAST LUMPECTOMY WITH RADIOACTIVE SEED AND SENTINEL LYMPH NODE BIOPSY;  Surgeon: Abigail Miyamoto, MD;  Location: [facility];  Service: General;  Laterality: Left;   CESAREAN SECTION  1986   fracture of leg     2 months ago    FAMILY HISTORY: Family History  Problem Relation Age of Onset   Other Mother        blood clots   Diabetes Brother    Neuropathy Brother        diabetic neuropathy   Diabetes Sister    Neuropathy Sister        diabetic neuropathy   Diabetes Paternal Aunt    Breast cancer Maternal Aunt        50   Breast cancer Other        PGM's sister; dx mid 23s   Colon cancer Maternal Aunt 58   Colon cancer Other        PGM's brother; dx 16s   Ovarian cancer Other        PGM's mother; dx unknown age  The patient's father died from rhabdomyolysis at the age of 22.  The patient's mother died from congestive heart failure at the age of 35.  The patient has 3 brothers, 2 sisters, with no known history of cancer although one of her sisters is currently being evaluated by Dr. Mosetta Putt.  There are is 1 paternal aunt with breast cancer 1 maternal aunt with breast cancer and 1 maternal aunt with colon cancer all over the age of 84 at diagnosis   GYNECOLOGIC HISTORY:  No LMP recorded. Patient is postmenopausal. Menarche: 65 years old Age at first live birth: 65 years old GX P 1 LMP 57 Contraceptive oral contraceptives approximately 10 years with no complications HRT no  Hysterectomy? no BSO? 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  She is disabled secondary to her  stroke.  She is divorced.  At home she lives with her fianc Lynden Oxford who is a retired Teacher, English as a foreign language.  They have 2 cats.  The patient has an adopted daughter, Tammy Sartorius, 49, who has a Scientist, water quality in social work and lives in [place], and daughter Lucio Edward, 35, lives in [place] and is a Merchandiser, retail for the call a nurse system.  The patient has 2 grandchildren, 1 of whom is a Nurse, adult in [place], and 1 great granddaughter.  She attends a local [employer] church    ADVANCED DIRECTIVES: Not in place.  At the 07/10/2020 visit the patient was given the appropriate documents to complete and notarized at her discretion.  She tells me she intends to name both her daughters as healthcare power of attorney   HEALTH MAINTENANCE: Social History   Tobacco Use   Smoking status: Every Day    Packs/day: 0.50    Years: 40.00    Pack years: 20.00    Types: Cigarettes   Smokeless tobacco: Never   Tobacco comments:    has used e-cigarettes, no vaping   Vaping Use  Vaping Use: Never used  Substance Use Topics   Alcohol use: Not Currently    Alcohol/week: 0.0 standard drinks    Comment: 1 per week   Drug use: No     Colonoscopy: Never  PAP: 2017  Bone density: Never   No Known Allergies  Current Outpatient Medications  Medication Sig Dispense Refill   anastrozole (ARIMIDEX) 1 MG tablet Take 1 tablet (1 mg total) by mouth daily. 90 tablet 4   Acetaminophen (TYLENOL PO) Take by mouth as needed.     amLODipine (NORVASC) 10 MG tablet Take 10 mg by mouth daily.     atenolol (TENORMIN) 25 MG tablet Take 25 mg by mouth daily.     gabapentin (NEURONTIN) 300 MG capsule Take 1-2 capsules (300-600 mg total) by mouth 2 (two) times daily. 360 capsule 2   hydrochlorothiazide (HYDRODIURIL) 25 MG tablet Take 25 mg by mouth daily.     HYDROcodone-acetaminophen (NORCO/VICODIN) 5-325 MG tablet Take 1 tablet by mouth 3 (three) times daily as needed.     IBUPROFEN PO Take by mouth as needed.      lisinopril (ZESTRIL) 20 MG tablet Take 20 mg by mouth daily.     Multiple Vitamin (MULTIVITAMIN PO) Take by mouth daily.     pantoprazole (PROTONIX) 40 MG tablet Take 40 mg by mouth daily.     traMADol (ULTRAM) 50 MG tablet Take 1-2 tablets (50-100 mg total) by mouth every 6 (six) hours as needed. 25 tablet 0   No current facility-administered medications for this visit.    OBJECTIVE: African-American woman who appears stated age  38:   10/21/20 1200  BP: (!) 153/78  Pulse: 68  Resp: 18  Temp: (!) 97.3 F (36.3 C)  SpO2: 100%      Body mass index is 31.39 kg/m.   Wt Readings from Last 3 Encounters:  10/21/20 171 lb 9.6 oz (77.8 kg)  08/21/20 164 lb 8 oz (74.6 kg)  07/26/20 164 lb 14.5 oz (74.8 kg)      ECOG FS:1 - Symptomatic but completely ambulatory  Sclerae unicteric, EOMs intact Wearing a mask No cervical or supraclavicular adenopathy Lungs no rales or rhonchi Heart regular rate and rhythm Abd soft, nontender, positive bowel sounds MSK no focal spinal tenderness, no upper extremity lymphedema Neuro: nonfocal, well oriented, appropriate affect Breasts: The right breast is benign.  The left breast is finished postlumpectomy and radiation.  The cosmetic result is good.  There is some coarsening of the skin anteriorly as expected.  There is some scar tissue also as expected.  There is hyperpigmentation.  There is no evidence of residual or recurrent disease.  Both axillae are benign.   LAB RESULTS:  CMP     Component Value Date/Time   NA 141 10/21/2020 1134   K 3.6 10/21/2020 1134   CL 105 10/21/2020 1134   CO2 27 10/21/2020 1134   GLUCOSE 94 10/21/2020 1134   BUN 13 10/21/2020 1134   CREATININE 0.85 10/21/2020 1134   CREATININE 0.76 07/10/2020 1220   CALCIUM 9.4 10/21/2020 1134   PROT 7.1 10/21/2020 1134   PROT 6.8 12/01/2018 1511   ALBUMIN 3.4 (L) 10/21/2020 1134   AST 12 (L) 10/21/2020 1134   AST 16 07/10/2020 1220   ALT 8 10/21/2020 1134   ALT 14  07/10/2020 1220   ALKPHOS 95 10/21/2020 1134   BILITOT <0.2 (L) 10/21/2020 1134   BILITOT 0.3 07/10/2020 1220   GFRNONAA >60 10/21/2020 1134   GFRNONAA >  60 07/10/2020 1220   GFRAA >90 10/04/2012 1310    No results found for: TOTALPROTELP, ALBUMINELP, A1GS, A2GS, BETS, BETA2SER, GAMS, MSPIKE, SPEI  Lab Results  Component Value Date   WBC 4.5 10/21/2020   NEUTROABS 3.5 10/21/2020   HGB 10.7 (L) 10/21/2020   HCT 33.5 (L) 10/21/2020   MCV 87.7 10/21/2020   PLT 182 10/21/2020    No results found for: LABCA2  No components found for: TKWIOX735  No results for input(s): INR in the last 168 hours.  No results found for: LABCA2  No results found for: HGD924  No results found for: QAS341  No results found for: DQQ229  No results found for: CA2729  No components found for: HGQUANT  No results found for: CEA1 / No results found for: CEA1   No results found for: AFPTUMOR  No results found for: CHROMOGRNA  No results found for: KPAFRELGTCHN, LAMBDASER, KAPLAMBRATIO (kappa/lambda light chains)  No results found for: HGBA, HGBA2QUANT, HGBFQUANT, HGBSQUAN (Hemoglobinopathy evaluation)   No results found for: LDH  No results found for: IRON, TIBC, IRONPCTSAT (Iron and TIBC)  No results found for: FERRITIN  Urinalysis    Component Value Date/Time   COLORURINE YELLOW 12/07/2007 0940   APPEARANCEUR CLEAR 12/07/2007 0940   LABSPEC 1.016 12/07/2007 0940   PHURINE 6.5 12/07/2007 0940   GLUCOSEU NEGATIVE 12/07/2007 0940   HGBUR NEGATIVE 12/07/2007 0940   BILIRUBINUR NEGATIVE 12/07/2007 0940   KETONESUR NEGATIVE 12/07/2007 0940   PROTEINUR 30 (A) 12/07/2007 0940   UROBILINOGEN 0.2 12/07/2007 0940   NITRITE NEGATIVE 12/07/2007 0940   LEUKOCYTESUR TRACE (A) 12/07/2007 0940    STUDIES: No results found.   ELIGIBLE FOR AVAILABLE RESEARCH PROTOCOL: No  ASSESSMENT: 65 y.o. [place] woman status post left breast upper outer quadrant biopsy 07/02/2020 for a clinical  T1b N0, stage IA invasive ductal carcinoma, grade 1, estrogen and progesterone receptor positive, HER2 not amplified, with an MIB-1 of 5%  (1) genetics testing 07/17/2020 and 07/22/2020 through the [facility] Panel or [facility] CancerNext-Expanded +RNAinsight found no deleterious mutations in   ATM, BRCA1, BRCA2, CDH1, CHEK2, PALB2, PTEN, and TP53.  The CancerNext-Expanded gene panel offered by [facility] and includes sequencing, rearrangement, and RNA analysis for the following 77 genes: AIP, ALK, APC, ATM, AXIN2, BAP1, BARD1, BLM, BMPR1A, BRCA1, BRCA2, BRIP1, CDC73, CDH1, CDK4, CDKN1B, CDKN2A, CHEK2, CTNNA1, DICER1, FANCC, FH, FLCN, GALNT12, KIF1B, LZTR1, MAX, MEN1, MET, MLH1, MSH2, MSH3, MSH6, MUTYH, NBN, NF1, NF2, NTHL1, PALB2, PHOX2B, PMS2, POT1, PRKAR1A, PTCH1, PTEN, RAD51C, RAD51D, RB1, RECQL, RET, SDHA, SDHAF2, SDHB, SDHC, SDHD, SMAD4, SMARCA4, SMARCB1, SMARCE1, STK11, SUFU, TMEM127, TP53, TSC1, TSC2, VHL and XRCC2 (sequencing and deletion/duplication); EGFR, EGLN1, HOXB13, KIT, MITF, PDGFRA, POLD1, and POLE (sequencing only); EPCAM and GREM1 (deletion/duplication only).   (2) left lumpectomy and sentinel lymph node sampling 07/26/2020 showed a pT1b pN0, stage IA invasive ductal carcinoma, grade 1, with negative margins.  (A) a total of 3 left axilla lymph nodes were removed  (3) Oncotype score of 16 predicts a risk of recurrence outside the breast within the next 9 years of 4% if the patient's only systemic therapy is an antiestrogen for 5 years.  It also predicts no benefit from chemotherapy  (4) adjuvant radiation 08/28/2020 through 09/25/2020 Site Technique Total Dose (Gy) Dose per Fx (Gy) Completed Fx Beam Energies  Breast, Left: Breast_Lt 3D 42.56/42.56 2.66 16/16 6XFFF, 10XFFF  Breast, Left: Breast_Lt_Bst 3D 8/8 2 4/4 6X, 10X   (5) anastrozole started 10/21/2020  (A)  bone density pending     PLAN: Nicki has completed local treatment for her breast cancer and is ready to start  antiestrogens.  She understands it will be another month or 2 before she more fully recovers from radiation but she is doing exactly the right thing by exercising.  I let her know that soreness sensitivity and shooting pains in the surgical breast are normal and if they recur this should not be a cause for worry.  We then discussed antiestrogens and specific the difference between tamoxifen and anastrozole in detail. She understands that anastrozole and the aromatase inhibitors in general work by blocking estrogen production. Accordingly vaginal dryness, decrease in bone density, and of course hot flashes can result. The aromatase inhibitors can also negatively affect the cholesterol profile, although that is a minor effect. One out of 5 women on aromatase inhibitors we will feel "old and achy". This arthralgia/myalgia syndrome, which resembles fibromyalgia clinically, does resolve with stopping the medications. Accordingly this is not a reason to not try an aromatase inhibitor but it is a frequent reason to stop it (in other words 20% of women will not be able to tolerate these medications).  Tamoxifen on the other hand does not block estrogen production. It does not "take away a woman's estrogen". It blocks the estrogen receptor in breast cells. Like anastrozole, it can also cause hot flashes. As opposed to anastrozole, tamoxifen has many estrogen-like effects. It is technically an estrogen receptor modulator. This means that in some tissues tamoxifen works like estrogen-- for example it helps strengthen the bones. It tends to improve the cholesterol profile. It can cause thickening of the endometrial lining, and even endometrial polyps or rarely cancer of the uterus.(The risk of uterine cancer due to tamoxifen is one additional cancer per thousand women year). It can cause vaginal wetness or stickiness. It can cause blood clots through this estrogen-like effect--the risk of blood clots with tamoxifen is  exactly the same as with birth control pills or hormone replacement.  Neither of these agents causes mood changes or weight gain, despite the popular belief that they can have these side effects. We have data from studies comparing either of these drugs with placebo, and in those cases the control group had the same amount of weight gain and depression as the group that took the drug.  We are going to start with anastrozole.  If she tolerates it well the plan will be to continue that for 5 years.  Otherwise we will consider switching to tamoxifen.  She will have a virtual visit with me in about 2 months to make sure she is tolerating treatment well.  Total encounter time 35 minutes.Sarajane Jews C. Seymore Brodowski, MD 10/21/2020 12:39 PM Medical Oncology and Hematology [facility] [place], [place] [address] Tel. [phone]    Fax. [phone]   This document serves as a record of services personally performed by Lurline Del, MD. It was created on his behalf by Wilburn Mylar, a trained medical scribe. The creation of this record is based on the scribe's personal observations and the provider's statements to them.   I, Lurline Del MD, have reviewed the above documentation for accuracy and completeness, and I agree with the above.   *Total Encounter Time as defined by the Centers for Medicare and Medicaid Services includes, in addition to the face-to-face time of a patient visit (documented in the note above) non-face-to-face time: obtaining and reviewing outside history, ordering and  reviewing medications, tests or procedures, care coordination (communications with other health care professionals or caregivers) and documentation in the medical record.  

## 2020-10-21 ENCOUNTER — Inpatient Hospital Stay: Payer: Medicare Other | Attending: Oncology

## 2020-10-21 ENCOUNTER — Other Ambulatory Visit: Payer: Self-pay

## 2020-10-21 ENCOUNTER — Inpatient Hospital Stay: Payer: Medicare Other | Admitting: Oncology

## 2020-10-21 VITALS — BP 153/78 | HR 68 | Temp 97.3°F | Resp 18 | Wt 171.6 lb

## 2020-10-21 DIAGNOSIS — F17210 Nicotine dependence, cigarettes, uncomplicated: Secondary | ICD-10-CM | POA: Diagnosis not present

## 2020-10-21 DIAGNOSIS — Z170 Estrogen receptor positive status [ER+]: Secondary | ICD-10-CM | POA: Diagnosis not present

## 2020-10-21 DIAGNOSIS — Z79811 Long term (current) use of aromatase inhibitors: Secondary | ICD-10-CM | POA: Diagnosis not present

## 2020-10-21 DIAGNOSIS — C50412 Malignant neoplasm of upper-outer quadrant of left female breast: Secondary | ICD-10-CM

## 2020-10-21 LAB — CBC WITH DIFFERENTIAL/PLATELET
Abs Immature Granulocytes: 0.01 10*3/uL (ref 0.00–0.07)
Basophils Absolute: 0 10*3/uL (ref 0.0–0.1)
Basophils Relative: 0 %
Eosinophils Absolute: 0.1 10*3/uL (ref 0.0–0.5)
Eosinophils Relative: 2 %
HCT: 33.5 % — ABNORMAL LOW (ref 36.0–46.0)
Hemoglobin: 10.7 g/dL — ABNORMAL LOW (ref 12.0–15.0)
Immature Granulocytes: 0 %
Lymphocytes Relative: 16 %
Lymphs Abs: 0.7 10*3/uL (ref 0.7–4.0)
MCH: 28 pg (ref 26.0–34.0)
MCHC: 31.9 g/dL (ref 30.0–36.0)
MCV: 87.7 fL (ref 80.0–100.0)
Monocytes Absolute: 0.3 10*3/uL (ref 0.1–1.0)
Monocytes Relative: 6 %
Neutro Abs: 3.5 10*3/uL (ref 1.7–7.7)
Neutrophils Relative %: 76 %
Platelets: 182 10*3/uL (ref 150–400)
RBC: 3.82 MIL/uL — ABNORMAL LOW (ref 3.87–5.11)
RDW: 14.6 % (ref 11.5–15.5)
WBC: 4.5 10*3/uL (ref 4.0–10.5)
nRBC: 0 % (ref 0.0–0.2)

## 2020-10-21 LAB — COMPREHENSIVE METABOLIC PANEL
ALT: 8 U/L (ref 0–44)
AST: 12 U/L — ABNORMAL LOW (ref 15–41)
Albumin: 3.4 g/dL — ABNORMAL LOW (ref 3.5–5.0)
Alkaline Phosphatase: 95 U/L (ref 38–126)
Anion gap: 9 (ref 5–15)
BUN: 13 mg/dL (ref 8–23)
CO2: 27 mmol/L (ref 22–32)
Calcium: 9.4 mg/dL (ref 8.9–10.3)
Chloride: 105 mmol/L (ref 98–111)
Creatinine, Ser: 0.85 mg/dL (ref 0.44–1.00)
GFR, Estimated: >60 mL/min (ref >60)
Glucose, Bld: 94 mg/dL (ref 70–99)
Potassium: 3.6 mmol/L (ref 3.5–5.1)
Sodium: 141 mmol/L (ref 135–145)
Total Bilirubin: <0.2 mg/dL — ABNORMAL LOW (ref 0.3–1.2)
Total Protein: 7.1 g/dL (ref 6.5–8.1)

## 2020-10-21 MED ORDER — ANASTROZOLE 1 MG PO TABS: 1.0000 mg | ORAL_TABLET | Freq: Every day | ORAL | 4 refills | Status: DC

## 2020-10-22 NOTE — Addendum Note (Signed)
Addended by: [name] on: 10/22/2020 07:50 AM   Modules accepted: Orders

## 2020-11-02 NOTE — Progress Notes (Signed)
  Radiation Oncology         [phone] ________________________________  Name: Tammy [name] MRN: [id number]  Date of Service: 11/04/2020  DOB: 1955-10-24  Post Treatment Telephone Note  Diagnosis:   Stage IA, pT1bN0M0, grade 1, ER/PR positive of the left breast.   Interval Since Last Radiation:  5 weeks   08/28/2020 through 09/25/2020 Site Technique Total Dose (Gy) Dose per Fx (Gy) Completed Fx Beam Energies  Breast, Left: Breast_Lt 3D 42.56/42.56 2.66 16/16 6XFFF, 10XFFF  Breast, Left: Breast_Lt_Bst 3D 8/8 2 4/4 6X, 10X    Narrative:  The patient was contacted today for routine follow-up. During treatment she did very well with radiotherapy and did not have significant desquamation.    Impression/Plan: 1. Stage IA, pT1bN0M0, grade 1, ER/PR positive of the left breast. I was unable to reach the patient but left a voicemail and on the message, I  discussed that we would be happy to continue to follow her as needed, but she will also continue to follow up with Dr. Jana Hakim in medical oncology. She was counseled on skin care as well as measures to avoid sun exposure to this area.  2. Survivorship. We discussed the importance of survivorship evaluation and encouraged her to attend her upcoming visit with that clinic.       Carola Rhine, PAC

## 2020-11-04 ENCOUNTER — Ambulatory Visit
Admission: RE | Admit: 2020-11-04 | Discharge: 2020-11-04 | Disposition: A | Discharge status: Home / Self Care | Payer: Medicare Other | Source: Ambulatory Visit | Attending: Oncology | Admitting: Oncology

## 2020-11-04 ENCOUNTER — Ambulatory Visit: Payer: Self-pay

## 2020-11-04 ENCOUNTER — Telehealth: Payer: Self-pay | Admitting: Adult Health

## 2020-11-04 DIAGNOSIS — Z170 Estrogen receptor positive status [ER+]: Secondary | ICD-10-CM

## 2020-11-04 DIAGNOSIS — C50412 Malignant neoplasm of upper-outer quadrant of left female breast: Secondary | ICD-10-CM

## 2020-11-04 NOTE — Telephone Encounter (Signed)
LMOM for patient to review the need for her to receive a survivorship care plan visit.  Schedule message sent.  My chart message sent.  Wilber Bihari, NP

## 2020-11-18 ENCOUNTER — Ambulatory Visit: Payer: Medicare Other

## 2020-12-04 ENCOUNTER — Inpatient Hospital Stay: Payer: Medicare Other | Attending: Oncology | Admitting: Adult Health

## 2020-12-23 NOTE — Progress Notes (Signed)
[place]  Telephone:([phone] Fax:([phone]) [phone]     ID: Tammy [name] DOB: 06/20/1955  MR#: [id number]  VAN#:[id number]  Patient Care Team: Tammy Jewel, MD as PCP - General (Internal Medicine) Tammy Keens, MD as Consulting Physician (General Surgery) Tammy [name], Tammy Dad, MD as Consulting Physician (Oncology) Tammy Rudd, MD as Consulting Physician (Radiation Oncology) Tammy Beam, MD as Consulting Physician (Neurology) Tammy Cruel, MD OTHER MD:    CHIEF COMPLAINT: Estrogen receptor positive breast cancer  CURRENT TREATMENT: anastrozole   INTERVAL HISTORY: Tammy [name] was phoned today for follow up of her estrogen receptor positive breast cancer.  However there was no answer and no way to leave a message.  I sent her an email asking her to call us back  She was prescribed anastrozole at her last visit on 10/21/2020.   REVIEW OF SYSTEMS: Tammy [name]    COVID 19 VACCINATION STATUS: fully vaccinated [employer]), with booster 12/2019   HISTORY OF CURRENT ILLNESS: From the original intake note:  Tammy [name] had routine screening mammography on 06/05/2020 showing a possible abnormality in the left breast. She underwent left diagnostic mammography with tomography and left breast ultrasonography at [facility] on 06/27/2020 showing: breast density category C; 9 mm left breast mass at 1 o'clock; no enlarged or morphologically abnormal lymph nodes.  Accordingly on 07/02/2020 she proceeded to biopsy of the left breast area in question. The pathology from this procedure ([id number]) showed: invasive ductal carcinoma, grade 1; ductal carcinoma in situ. Prognostic indicators significant for: estrogen receptor, 100% positive and progesterone receptor, 5% positive, both with strong staining intensity. Proliferation marker Ki67 at 5%. HER2 equivocal by immunohistochemistry (2+), but negative by fluorescent in situ hybridization with a signals  ratio 1.24 and number per cell 1.8.  Cancer Staging Malignant neoplasm of upper-outer quadrant of left breast in female, estrogen receptor positive ([place]) Staging form: Breast, AJCC 8th Edition - Clinical stage from 07/10/2020: Stage IA (cT1b, cN0, cM0, G1, ER+, PR+, HER2-) - Signed by Tammy Cruel, MD on 07/10/2020 Stage prefix: Initial diagnosis Histologic grading system: 3 grade system - Pathologic: Stage IA (pT1b, pN0, cM0, G1, ER+, PR+, HER2-) - Signed by Tammy Phlegm, NP on 12/04/2020 Histologic grading system: 3 grade system  The patient's subsequent history is as detailed below.   PAST MEDICAL HISTORY: Past Medical History:  Diagnosis Date   Arthritis    Bipolar disorder ([place])    Breast cancer ([place])    Depression    Family history of breast cancer 07/10/2020   Family history of colon cancer 07/10/2020   Family history of ovarian cancer 07/10/2020   GERD (gastroesophageal reflux disease)    HTN (hypertension)    Stroke ([place]) 09/2012   slurred speech and left sided weakness/notes 10/04/2012    PAST SURGICAL HISTORY: Past Surgical History:  Procedure Laterality Date   BREAST LUMPECTOMY WITH RADIOACTIVE SEED AND SENTINEL LYMPH NODE BIOPSY Left 07/26/2020   Procedure: LEFT BREAST LUMPECTOMY WITH RADIOACTIVE SEED AND SENTINEL LYMPH NODE BIOPSY;  Surgeon: Tammy Keens, MD;  Location: [facility];  Service: General;  Laterality: Left;   CESAREAN SECTION  1986   fracture of leg     2 months ago    FAMILY HISTORY: Family History  Problem Relation Age of Onset   Other Mother        blood clots   Diabetes Brother    Neuropathy Brother        diabetic neuropathy   Diabetes  Sister    Neuropathy Sister        diabetic neuropathy   Diabetes Paternal Aunt    Breast cancer Maternal Aunt        40   Breast cancer Other        PGM's sister; dx mid 16s   Colon cancer Maternal Aunt 69   Colon cancer Other        PGM's brother; dx 3s    Ovarian cancer Other        PGM's mother; dx unknown age  The patient's father died from rhabdomyolysis at the age of 6.  The patient's mother died from congestive heart failure at the age of 15.  The patient has 3 brothers, 2 sisters, with no known history of cancer although one of her sisters is currently being evaluated by Dr. Burr Medico.  There are is 1 paternal aunt with breast cancer 1 maternal aunt with breast cancer and 1 maternal aunt with colon cancer all over the age of 32 at diagnosis   GYNECOLOGIC HISTORY:  No LMP recorded. Patient is postmenopausal. Menarche: 65 years old Age at first live birth: 65 years old [place] P 1 LMP 21 Contraceptive oral contraceptives approximately 10 years with no complications HRT no  Hysterectomy? no BSO? 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  She is disabled secondary to her stroke.  She is divorced.  At home she lives with her Van Horne who is a retired Engineering geologist.  They have 2 cats.  The patient has an adopted daughter, Tammy [name], 49, who has a Oceanographer in social work and lives in [place], and daughter Tammy [name], 35, lives in [place] and is a Librarian, academic for the call a nurse system.  The patient has 2 grandchildren, 1 of whom is a Higher education careers adviser in [place], and 1 great granddaughter.  She attends a local [employer]    ADVANCED DIRECTIVES: Not in place.  At the 07/10/2020 visit the patient was given the appropriate documents to complete and notarized at her discretion.  She tells me she intends to name both her daughters as healthcare power of attorney   HEALTH MAINTENANCE: Social History   Tobacco Use   Smoking status: Every Day    Packs/day: 0.50    Years: 40.00    Pack years: 20.00    Types: Cigarettes   Smokeless tobacco: Never   Tobacco comments:    has used e-cigarettes, no vaping   Vaping Use   Vaping Use: Never used  Substance Use  Topics   Alcohol use: Not Currently    Alcohol/week: 0.0 standard drinks    Comment: 1 per week   Drug use: No     Colonoscopy: Never  PAP: 2017  Bone density: Never   No Known Allergies  Current Outpatient Medications  Medication Sig Dispense Refill   Acetaminophen (TYLENOL PO) Take by mouth as needed.     amLODipine (NORVASC) 10 MG tablet Take 10 mg by mouth daily.     anastrozole (ARIMIDEX) 1 MG tablet Take 1 tablet (1 mg total) by mouth daily. 90 tablet 4   atenolol (TENORMIN) 25 MG tablet Take 25 mg by mouth daily.     gabapentin (NEURONTIN) 300 MG capsule Take 1-2 capsules (300-600 mg total) by mouth 2 (two) times daily. 360 capsule 2   hydrochlorothiazide (HYDRODIURIL) 25 MG tablet Take 25 mg by mouth daily.  IBUPROFEN PO Take by mouth as needed.     lisinopril (ZESTRIL) 20 MG tablet Take 20 mg by mouth daily.     Multiple Vitamin (MULTIVITAMIN PO) Take by mouth daily.     pantoprazole (PROTONIX) 40 MG tablet Take 40 mg by mouth daily.     No current facility-administered medications for this visit.    OBJECTIVE: African-American woman who appears stated age  There were no vitals filed for this visit.     There is no height or weight on file to calculate BMI.   Wt Readings from Last 3 Encounters:  10/21/20 171 lb 9.6 oz (77.8 kg)  08/21/20 164 lb 8 oz (74.6 kg)  07/26/20 164 lb 14.5 oz (74.8 kg)     ECOG FS:1 - Symptomatic but completely ambulatory   LAB RESULTS:  CMP     Component Value Date/Time   NA 141 10/21/2020 1134   K 3.6 10/21/2020 1134   CL 105 10/21/2020 1134   CO2 27 10/21/2020 1134   GLUCOSE 94 10/21/2020 1134   BUN 13 10/21/2020 1134   CREATININE 0.85 10/21/2020 1134   CREATININE 0.76 07/10/2020 1220   CALCIUM 9.4 10/21/2020 1134   PROT 7.1 10/21/2020 1134   PROT 6.8 12/01/2018 1511   ALBUMIN 3.4 (L) 10/21/2020 1134   AST 12 (L) 10/21/2020 1134   AST 16 07/10/2020 1220   ALT 8 10/21/2020 1134   ALT 14 07/10/2020 1220   ALKPHOS 95  10/21/2020 1134   BILITOT <0.2 (L) 10/21/2020 1134   BILITOT 0.3 07/10/2020 1220   GFRNONAA >60 10/21/2020 1134   GFRNONAA >60 07/10/2020 1220   GFRAA >90 10/04/2012 1310    No results found for: TOTALPROTELP, ALBUMINELP, A1GS, A2GS, BETS, BETA2SER, GAMS, MSPIKE, SPEI  Lab Results  Component Value Date   WBC 4.5 10/21/2020   NEUTROABS 3.5 10/21/2020   HGB 10.7 (L) 10/21/2020   HCT 33.5 (L) 10/21/2020   MCV 87.7 10/21/2020   PLT 182 10/21/2020    No results found for: LABCA2  No components found for: HWKGSU110  No results for input(s): INR in the last 168 hours.  No results found for: LABCA2  No results found for: RPR945  No results found for: OPF292  No results found for: KMQ286  No results found for: CA2729  No components found for: HGQUANT  No results found for: CEA1 / No results found for: CEA1   No results found for: AFPTUMOR  No results found for: CHROMOGRNA  No results found for: KPAFRELGTCHN, LAMBDASER, KAPLAMBRATIO (kappa/lambda light chains)  No results found for: HGBA, HGBA2QUANT, HGBFQUANT, HGBSQUAN (Hemoglobinopathy evaluation)   No results found for: LDH  No results found for: IRON, TIBC, IRONPCTSAT (Iron and TIBC)  No results found for: FERRITIN  Urinalysis    Component Value Date/Time   COLORURINE YELLOW 12/07/2007 0940   APPEARANCEUR CLEAR 12/07/2007 0940   LABSPEC 1.016 12/07/2007 0940   PHURINE 6.5 12/07/2007 0940   GLUCOSEU NEGATIVE 12/07/2007 0940   HGBUR NEGATIVE 12/07/2007 0940   BILIRUBINUR NEGATIVE 12/07/2007 0940   KETONESUR NEGATIVE 12/07/2007 0940   PROTEINUR 30 (A) 12/07/2007 0940   UROBILINOGEN 0.2 12/07/2007 0940   NITRITE NEGATIVE 12/07/2007 0940   LEUKOCYTESUR TRACE (A) 12/07/2007 0940    STUDIES: No results found.   ELIGIBLE FOR AVAILABLE RESEARCH PROTOCOL: No  ASSESSMENT: 65 y.o. [place] woman status post left breast upper outer quadrant biopsy 07/02/2020 for a clinical T1b N0, stage IA invasive  ductal carcinoma, grade 1, estrogen and  progesterone receptor positive, HER2 not amplified, with an MIB-1 of 5%  (1) genetics testing 07/17/2020 and 07/22/2020 through the [facility] Panel or [facility] CancerNext-Expanded +RNAinsight found no deleterious mutations in   ATM, BRCA1, BRCA2, CDH1, CHEK2, PALB2, PTEN, and TP53.  The CancerNext-Expanded gene panel offered by [facility] and includes sequencing, rearrangement, and RNA analysis for the following 77 genes: AIP, ALK, APC, ATM, AXIN2, BAP1, BARD1, BLM, BMPR1A, BRCA1, BRCA2, BRIP1, CDC73, CDH1, CDK4, CDKN1B, CDKN2A, CHEK2, CTNNA1, DICER1, FANCC, FH, FLCN, GALNT12, KIF1B, LZTR1, MAX, MEN1, MET, MLH1, MSH2, MSH3, MSH6, MUTYH, NBN, NF1, NF2, NTHL1, PALB2, PHOX2B, PMS2, POT1, PRKAR1A, PTCH1, PTEN, RAD51C, RAD51D, RB1, RECQL, RET, SDHA, SDHAF2, SDHB, SDHC, SDHD, SMAD4, SMARCA4, SMARCB1, SMARCE1, STK11, SUFU, TMEM127, TP53, TSC1, TSC2, VHL and XRCC2 (sequencing and deletion/duplication); EGFR, EGLN1, HOXB13, KIT, MITF, PDGFRA, POLD1, and POLE (sequencing only); EPCAM and GREM1 (deletion/duplication only).   (2) left lumpectomy and sentinel lymph node sampling 07/26/2020 showed a pT1b pN0, stage IA invasive ductal carcinoma, grade 1, with negative margins.  (A) a total of 3 left axilla lymph nodes were removed  (3) Oncotype score of 16 predicts a risk of recurrence outside the breast within the next 9 years of 4% if the patient's only systemic therapy is an antiestrogen for 5 years.  It also predicts no benefit from chemotherapy  (4) adjuvant radiation 08/28/2020 through 09/25/2020 Site Technique Total Dose (Gy) Dose per Fx (Gy) Completed [facility]x [name]  Breast, Left: Breast_Lt 3D 42.56/42.56 2.66 16/16 6XFFF, 10XFFF  Breast, Left: Breast_Lt_Bst 3D 8/8 2 4/4 6X, 10X   (5) anastrozole started 10/21/2020  (A) bone density pending   PLAN: Tammy [name] could not be reached for her virtual visit today.  Her appointment is being rescheduled as an in person  visit     Sarajane Jews C. Masin Shatto, MD 12/24/2020 7:06 PM Medical Oncology and Hematology [facility] [place], [place] [address] Tel. [phone]    Fax. [phone]   This document serves as a record of services personally performed by Lurline Del, MD. It was created on his behalf by Wilburn Mylar, a trained medical scribe. The creation of this record is based on the scribe's personal observations and the provider's statements to them.   I, Lurline Del MD, have reviewed the above documentation for accuracy and completeness, and I agree with the above.   *Total Encounter Time as defined by the Centers for Medicare and Medicaid Services includes, in addition to the face-to-face time of a patient visit (documented in the note above) non-face-to-face time: obtaining and reviewing outside history, ordering and reviewing medications, tests or procedures, care coordination (communications with other health care professionals or caregivers) and documentation in the medical record.

## 2020-12-24 ENCOUNTER — Inpatient Hospital Stay: Payer: Medicare Other | Attending: Oncology | Admitting: Oncology

## 2020-12-24 ENCOUNTER — Other Ambulatory Visit: Payer: Self-pay

## 2020-12-24 DIAGNOSIS — C50412 Malignant neoplasm of upper-outer quadrant of left female breast: Secondary | ICD-10-CM

## 2020-12-24 DIAGNOSIS — Z170 Estrogen receptor positive status [ER+]: Secondary | ICD-10-CM

## 2021-01-01 DIAGNOSIS — K7469 Other cirrhosis of liver: Secondary | ICD-10-CM | POA: Insufficient documentation

## 2021-01-06 ENCOUNTER — Other Ambulatory Visit: Payer: Self-pay | Admitting: Nurse Practitioner

## 2021-01-06 DIAGNOSIS — K7469 Other cirrhosis of liver: Secondary | ICD-10-CM

## 2021-01-28 NOTE — Progress Notes (Signed)
[place]  [phone] [phone]     ID: Tammy [name] DOB: 1955-11-12  MR#: [id number]  GPQ#:[id number]  Patient Care Team: [name], MD as PCP - General (Internal Medicine) [name], MD as Consulting Physician (General Surgery) Maxximus Gotay, [name], MD as Consulting Physician (Oncology) [name], MD as Consulting Physician (Radiation Oncology) [name], MD as Consulting Physician (Neurology) [name], MD OTHER MD:   CHIEF COMPLAINT: Estrogen receptor positive breast cancer  CURRENT TREATMENT: anastrozole   INTERVAL HISTORY: [name] was scheduled today for follow up of her estrogen receptor positive breast cancer.  However she did not show.  She was prescribed anastrozole at her last visit on 10/21/2020.   REVIEW OF SYSTEMS: [name]    COVID 19 VACCINATION STATUS: fully vaccinated [employer]), with booster 12/2019   HISTORY OF CURRENT ILLNESS: From the original intake note:  Tammy [name] had routine screening mammography on 06/05/2020 showing a possible abnormality in the left breast. She underwent left diagnostic mammography with tomography and left breast ultrasonography at [facility] on 06/27/2020 showing: breast density category C; 9 mm left breast mass at 1 o'clock; no enlarged or morphologically abnormal lymph nodes.  Accordingly on 07/02/2020 she proceeded to biopsy of the left breast area in question. The pathology from this procedure ([id number]) showed: invasive ductal carcinoma, grade 1; ductal carcinoma in situ. Prognostic indicators significant for: estrogen receptor, 100% positive and progesterone receptor, 5% positive, both with strong staining intensity. Proliferation marker Ki67 at 5%. HER2 equivocal by immunohistochemistry (2+), but negative by fluorescent in situ hybridization with a signals ratio 1.24 and number per cell 1.8.   Cancer Staging  Malignant neoplasm of  upper-outer quadrant of left breast in female, estrogen receptor positive ([place]) Staging form: Breast, AJCC 8th Edition - Clinical stage from 07/10/2020: Stage IA (cT1b, cN0, cM0, G1, ER+, PR+, HER2-) - Signed by [name], MD on 07/10/2020 Stage prefix: Initial diagnosis Histologic grading system: 3 grade system - Pathologic: Stage IA (pT1b, pN0, cM0, G1, ER+, PR+, HER2-) - Signed by Gardenia Phlegm, NP on 12/04/2020 Histologic grading system: 3 grade system  The patient's subsequent history is as detailed below.   PAST MEDICAL HISTORY: Past Medical History:  Diagnosis Date   Arthritis    Bipolar disorder ([place])    Breast cancer ([place])    Depression    Family history of breast cancer 07/10/2020   Family history of colon cancer 07/10/2020   Family history of ovarian cancer 07/10/2020   GERD (gastroesophageal reflux disease)    HTN (hypertension)    Stroke ([place]) 09/2012   slurred speech and left sided weakness/notes 10/04/2012    PAST SURGICAL HISTORY: Past Surgical History:  Procedure Laterality Date   BREAST LUMPECTOMY WITH RADIOACTIVE SEED AND SENTINEL LYMPH NODE BIOPSY Left 07/26/2020   Procedure: LEFT BREAST LUMPECTOMY WITH RADIOACTIVE SEED AND SENTINEL LYMPH NODE BIOPSY;  Surgeon: [name], MD;  Location: [facility];  Service: General;  Laterality: Left;   CESAREAN SECTION  1986   fracture of leg     2 months ago    FAMILY HISTORY: Family History  Problem Relation Age of Onset   Other Mother        blood clots   Diabetes Brother    Neuropathy Brother        diabetic neuropathy   Diabetes Sister    Neuropathy Sister        diabetic neuropathy   Diabetes  Paternal Aunt    Breast cancer Maternal Aunt        37   Breast cancer Other        PGM's sister; dx mid 66s   Colon cancer Maternal Aunt 26   Colon cancer Other        PGM's brother; dx 28s   Ovarian cancer Other        PGM's mother; dx unknown age  The patient's father  died from rhabdomyolysis at the age of 23.  The patient's mother died from congestive heart failure at the age of 68.  The patient has 3 brothers, 2 sisters, with no known history of cancer although one of her sisters is currently being evaluated by Dr. Burr Medico.  There are is 1 paternal aunt with breast cancer 1 maternal aunt with breast cancer and 1 maternal aunt with colon cancer all over the age of 20 at diagnosis   GYNECOLOGIC HISTORY:  No LMP recorded. Patient is postmenopausal. Menarche: 65 years old Age at first live birth: 65 years old [place] P 1 LMP 77 Contraceptive oral contraceptives approximately 10 years with no complications HRT no  Hysterectomy? no BSO? 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  She is disabled secondary to her stroke.  She is divorced.  At home she lives with her [place] who is a retired Engineering geologist.  They have 2 cats.  The patient has an adopted daughter, Tammy [name], 49, who has a Oceanographer in social work and lives in [place], and daughter Tammy [name], 35, lives in [place] and is a Librarian, academic for the call a nurse system.  The patient has 2 grandchildren, 1 of whom is a Higher education careers adviser in [place], and 1 great granddaughter.  She attends a local [employer] church    ADVANCED DIRECTIVES: Not in place.  At the 07/10/2020 visit the patient was given the appropriate documents to complete and notarized at her discretion.  She tells me she intends to name both her daughters as healthcare power of attorney   HEALTH MAINTENANCE: Social History   Tobacco Use   Smoking status: Every Day    Packs/day: 0.50    Years: 40.00    Pack years: 20.00    Types: Cigarettes   Smokeless tobacco: Never   Tobacco comments:    has used e-cigarettes, no vaping   Vaping Use   Vaping Use: Never used  Substance Use Topics   Alcohol use: Not Currently    Alcohol/week: 0.0 standard drinks     Comment: 1 per week   Drug use: No     Colonoscopy: Never  PAP: 2017  Bone density: Never   No Known Allergies  Current Outpatient Medications  Medication Sig Dispense Refill   Acetaminophen (TYLENOL PO) Take by mouth as needed.     amLODipine (NORVASC) 10 MG tablet Take 10 mg by mouth daily.     anastrozole (ARIMIDEX) 1 MG tablet Take 1 tablet (1 mg total) by mouth daily. 90 tablet 4   atenolol (TENORMIN) 25 MG tablet Take 25 mg by mouth daily.     gabapentin (NEURONTIN) 300 MG capsule Take 1-2 capsules (300-600 mg total) by mouth 2 (two) times daily. 360 capsule 2   hydrochlorothiazide (HYDRODIURIL) 25 MG tablet Take 25 mg by mouth daily.     IBUPROFEN PO Take by mouth as needed.     lisinopril (ZESTRIL) 20 MG tablet Take  20 mg by mouth daily.     Multiple Vitamin (MULTIVITAMIN PO) Take by mouth daily.     pantoprazole (PROTONIX) 40 MG tablet Take 40 mg by mouth daily.     No current facility-administered medications for this visit.    OBJECTIVE: African-American woman who appears stated age  There were no vitals filed for this visit.     There is no height or weight on file to calculate BMI.   Wt Readings from Last 3 Encounters:  10/21/20 171 lb 9.6 oz (77.8 kg)  08/21/20 164 lb 8 oz (74.6 kg)  07/26/20 164 lb 14.5 oz (74.8 kg)     ECOG FS:1 - Symptomatic but completely ambulatory   LAB RESULTS:  CMP     Component Value Date/Time   NA 141 10/21/2020 1134   K 3.6 10/21/2020 1134   CL 105 10/21/2020 1134   CO2 27 10/21/2020 1134   GLUCOSE 94 10/21/2020 1134   BUN 13 10/21/2020 1134   CREATININE 0.85 10/21/2020 1134   CREATININE 0.76 07/10/2020 1220   CALCIUM 9.4 10/21/2020 1134   PROT 7.1 10/21/2020 1134   PROT 6.8 12/01/2018 1511   ALBUMIN 3.4 (L) 10/21/2020 1134   AST 12 (L) 10/21/2020 1134   AST 16 07/10/2020 1220   ALT 8 10/21/2020 1134   ALT 14 07/10/2020 1220   ALKPHOS 95 10/21/2020 1134   BILITOT <0.2 (L) 10/21/2020 1134   BILITOT 0.3 07/10/2020  1220   GFRNONAA >60 10/21/2020 1134   GFRNONAA >60 07/10/2020 1220   GFRAA >90 10/04/2012 1310    No results found for: TOTALPROTELP, ALBUMINELP, A1GS, A2GS, BETS, BETA2SER, GAMS, MSPIKE, SPEI  Lab Results  Component Value Date   WBC 4.5 10/21/2020   NEUTROABS 3.5 10/21/2020   HGB 10.7 (L) 10/21/2020   HCT 33.5 (L) 10/21/2020   MCV 87.7 10/21/2020   PLT 182 10/21/2020    No results found for: LABCA2  No components found for: [id number]  No results for input(s): INR in the last 168 hours.  No results found for: LABCA2  No results found for: VXB939  No results found for: QZE092  No results found for: ZRA076  No results found for: CA2729  No components found for: HGQUANT  No results found for: CEA1 / No results found for: CEA1   No results found for: AFPTUMOR  No results found for: CHROMOGRNA  No results found for: KPAFRELGTCHN, LAMBDASER, KAPLAMBRATIO (kappa/lambda light chains)  No results found for: HGBA, HGBA2QUANT, HGBFQUANT, HGBSQUAN (Hemoglobinopathy evaluation)   No results found for: LDH  No results found for: IRON, TIBC, IRONPCTSAT (Iron and TIBC)  No results found for: FERRITIN  Urinalysis    Component Value Date/Time   COLORURINE YELLOW 12/07/2007 0940   APPEARANCEUR CLEAR 12/07/2007 0940   LABSPEC 1.016 12/07/2007 0940   PHURINE 6.5 12/07/2007 0940   GLUCOSEU NEGATIVE 12/07/2007 0940   HGBUR NEGATIVE 12/07/2007 0940   BILIRUBINUR NEGATIVE 12/07/2007 0940   KETONESUR NEGATIVE 12/07/2007 0940   PROTEINUR 30 (A) 12/07/2007 0940   UROBILINOGEN 0.2 12/07/2007 0940   NITRITE NEGATIVE 12/07/2007 0940   LEUKOCYTESUR TRACE (A) 12/07/2007 0940    STUDIES: No results found.   ELIGIBLE FOR AVAILABLE RESEARCH PROTOCOL: No  ASSESSMENT: 65 y.o. [place] woman status post left breast upper outer quadrant biopsy 07/02/2020 for a clinical T1b N0, stage IA invasive ductal carcinoma, grade 1, estrogen and progesterone receptor positive, HER2 not  amplified, with an MIB-1 of 5%  (1) genetics testing 07/17/2020 and  07/22/2020 through the [facility] Panel or [facility] CancerNext-Expanded +RNAinsight found no deleterious mutations in   ATM, BRCA1, BRCA2, CDH1, CHEK2, PALB2, PTEN, and TP53.  The CancerNext-Expanded gene panel offered by [facility] and includes sequencing, rearrangement, and RNA analysis for the following 77 genes: AIP, ALK, APC, ATM, AXIN2, BAP1, BARD1, BLM, BMPR1A, BRCA1, BRCA2, BRIP1, CDC73, CDH1, CDK4, CDKN1B, CDKN2A, CHEK2, CTNNA1, DICER1, FANCC, FH, FLCN, GALNT12, KIF1B, LZTR1, MAX, MEN1, MET, MLH1, MSH2, MSH3, MSH6, MUTYH, NBN, NF1, NF2, NTHL1, PALB2, PHOX2B, PMS2, POT1, PRKAR1A, PTCH1, PTEN, RAD51C, RAD51D, RB1, RECQL, RET, SDHA, SDHAF2, SDHB, SDHC, SDHD, SMAD4, SMARCA4, SMARCB1, SMARCE1, STK11, SUFU, TMEM127, TP53, TSC1, TSC2, VHL and XRCC2 (sequencing and deletion/duplication); EGFR, EGLN1, HOXB13, KIT, MITF, PDGFRA, POLD1, and POLE (sequencing only); EPCAM and GREM1 (deletion/duplication only).   (2) left lumpectomy and sentinel lymph node sampling 07/26/2020 showed a pT1b pN0, stage IA invasive ductal carcinoma, grade 1, with negative margins.  (A) a total of 3 left axilla lymph nodes were removed  (3) Oncotype score of 16 predicts a risk of recurrence outside the breast within the next 9 years of 4% if the patient's only systemic therapy is an antiestrogen for 5 years.  It also predicts no benefit from chemotherapy  (4) adjuvant radiation 08/28/2020 through 09/25/2020 Site Technique Total Dose (Gy) Dose per Fx (Gy) Completed Fx Beam Energies  Breast, Left: Breast_Lt 3D 42.56/42.56 2.66 16/16 6XFFF, 10XFFF  Breast, Left: Breast_Lt_Bst 3D 8/8 2 4/4 6X, 10X   (5) anastrozole started 10/21/2020  (A) bone density pending   PLAN: Ranay could not be reached for her most recent virtual visit and did not show for an in person visit today 01/29/2021.. A follow-up letter has been sent    Sarajane Jews C. Dejana Pugsley, MD  01/29/2021 5:16 PM Medical Oncology and Hematology [facility],  [address] Tel. [phone]    Fax. [phone]   This document serves as a record of services personally performed by Lurline Del, MD. It was created on his behalf by Wilburn Mylar, a trained medical scribe. The creation of this record is based on the scribe's personal observations and the provider's statements to them.   I, Lurline Del MD, have reviewed the above documentation for accuracy and completeness, and I agree with the above.   *Total Encounter Time as defined by the Centers for Medicare and Medicaid Services includes, in addition to the face-to-face time of a patient visit (documented in the note above) non-face-to-face time: obtaining and reviewing outside history, ordering and reviewing medications, tests or procedures, care coordination (communications with other health care professionals or caregivers) and documentation in the medical record.

## 2021-01-29 ENCOUNTER — Inpatient Hospital Stay: Payer: Medicare Other | Attending: Oncology

## 2021-01-29 ENCOUNTER — Inpatient Hospital Stay (HOSPITAL_BASED_OUTPATIENT_CLINIC_OR_DEPARTMENT_OTHER): Payer: Medicare Other | Admitting: Oncology

## 2021-01-29 ENCOUNTER — Encounter: Payer: Self-pay | Admitting: Oncology

## 2021-01-29 DIAGNOSIS — C50412 Malignant neoplasm of upper-outer quadrant of left female breast: Secondary | ICD-10-CM

## 2021-01-29 DIAGNOSIS — Z170 Estrogen receptor positive status [ER+]: Secondary | ICD-10-CM

## 2021-01-31 ENCOUNTER — Telehealth: Payer: Self-pay | Admitting: Adult Health

## 2021-01-31 NOTE — Telephone Encounter (Signed)
Called patient to discuss setting up survivorship care plan visit and to review her follow-up plan.  She does not have another appointment scheduled with our office.  I was unable to leave a voicemail.  MyChart message sent.  Wilber Bihari, NP 01/31/21 5:18 PM Medical Oncology and Hematology [facility] [place], [place] [address] Tel. [phone]    Fax. [phone]

## 2021-02-17 ENCOUNTER — Telehealth: Payer: Self-pay | Admitting: *Deleted

## 2021-03-05 ENCOUNTER — Other Ambulatory Visit: Payer: Self-pay | Admitting: *Deleted

## 2021-03-05 MED ORDER — ANASTROZOLE 1 MG PO TABS: 1.0000 mg | ORAL_TABLET | Freq: Every day | ORAL | 4 refills | Status: DC

## 2021-03-06 ENCOUNTER — Telehealth: Payer: Self-pay | Admitting: *Deleted

## 2021-03-06 ENCOUNTER — Ambulatory Visit
Admission: RE | Admit: 2021-03-06 | Discharge: 2021-03-06 | Disposition: A | Discharge status: Home / Self Care | Payer: Medicare Other | Source: Ambulatory Visit | Attending: Nurse Practitioner | Admitting: Nurse Practitioner

## 2021-03-06 DIAGNOSIS — K7469 Other cirrhosis of liver: Secondary | ICD-10-CM

## 2021-03-10 ENCOUNTER — Telehealth: Payer: Self-pay | Admitting: *Deleted

## 2021-03-27 ENCOUNTER — Telehealth: Payer: Self-pay | Admitting: *Deleted

## 2021-04-08 ENCOUNTER — Emergency Department (HOSPITAL_COMMUNITY): Payer: Medicare Other

## 2021-04-08 ENCOUNTER — Inpatient Hospital Stay (HOSPITAL_COMMUNITY)
Admission: EM | Admit: 2021-04-08 | Discharge: 2021-04-16 | DRG: 481 | Disposition: A | Discharge status: Skilled Nursing Facility | Payer: Medicare Other | Attending: Internal Medicine | Admitting: Internal Medicine

## 2021-04-08 DIAGNOSIS — E871 Hypo-osmolality and hyponatremia: Secondary | ICD-10-CM | POA: Diagnosis not present

## 2021-04-08 DIAGNOSIS — F17210 Nicotine dependence, cigarettes, uncomplicated: Secondary | ICD-10-CM | POA: Diagnosis present

## 2021-04-08 DIAGNOSIS — D638 Anemia in other chronic diseases classified elsewhere: Secondary | ICD-10-CM | POA: Diagnosis present

## 2021-04-08 DIAGNOSIS — D649 Anemia, unspecified: Secondary | ICD-10-CM | POA: Diagnosis not present

## 2021-04-08 DIAGNOSIS — B182 Chronic viral hepatitis C: Secondary | ICD-10-CM | POA: Diagnosis present

## 2021-04-08 DIAGNOSIS — S72352A Displaced comminuted fracture of shaft of left femur, initial encounter for closed fracture: Secondary | ICD-10-CM | POA: Diagnosis not present

## 2021-04-08 DIAGNOSIS — M898X9 Other specified disorders of bone, unspecified site: Secondary | ICD-10-CM | POA: Diagnosis present

## 2021-04-08 DIAGNOSIS — Z800 Family history of malignant neoplasm of digestive organs: Secondary | ICD-10-CM

## 2021-04-08 DIAGNOSIS — K219 Gastro-esophageal reflux disease without esophagitis: Secondary | ICD-10-CM | POA: Diagnosis present

## 2021-04-08 DIAGNOSIS — E860 Dehydration: Secondary | ICD-10-CM | POA: Diagnosis present

## 2021-04-08 DIAGNOSIS — I10 Essential (primary) hypertension: Secondary | ICD-10-CM | POA: Diagnosis present

## 2021-04-08 DIAGNOSIS — Z803 Family history of malignant neoplasm of breast: Secondary | ICD-10-CM

## 2021-04-08 DIAGNOSIS — Z170 Estrogen receptor positive status [ER+]: Secondary | ICD-10-CM

## 2021-04-08 DIAGNOSIS — K7460 Unspecified cirrhosis of liver: Secondary | ICD-10-CM | POA: Diagnosis present

## 2021-04-08 DIAGNOSIS — E876 Hypokalemia: Secondary | ICD-10-CM

## 2021-04-08 DIAGNOSIS — E559 Vitamin D deficiency, unspecified: Secondary | ICD-10-CM | POA: Diagnosis present

## 2021-04-08 DIAGNOSIS — S72402A Unspecified fracture of lower end of left femur, initial encounter for closed fracture: Secondary | ICD-10-CM | POA: Diagnosis not present

## 2021-04-08 DIAGNOSIS — E669 Obesity, unspecified: Secondary | ICD-10-CM | POA: Diagnosis present

## 2021-04-08 DIAGNOSIS — Y9301 Activity, walking, marching and hiking: Secondary | ICD-10-CM | POA: Diagnosis present

## 2021-04-08 DIAGNOSIS — Y92512 Supermarket, store or market as the place of occurrence of the external cause: Secondary | ICD-10-CM

## 2021-04-08 DIAGNOSIS — Z8041 Family history of malignant neoplasm of ovary: Secondary | ICD-10-CM | POA: Diagnosis not present

## 2021-04-08 DIAGNOSIS — S72009S Fracture of unspecified part of neck of unspecified femur, sequela: Secondary | ICD-10-CM | POA: Diagnosis not present

## 2021-04-08 DIAGNOSIS — Z8673 Personal history of transient ischemic attack (TIA), and cerebral infarction without residual deficits: Secondary | ICD-10-CM | POA: Diagnosis not present

## 2021-04-08 DIAGNOSIS — T502X5A Adverse effect of carbonic-anhydrase inhibitors, benzothiadiazides and other diuretics, initial encounter: Secondary | ICD-10-CM | POA: Diagnosis not present

## 2021-04-08 DIAGNOSIS — Z20822 Contact with and (suspected) exposure to covid-19: Secondary | ICD-10-CM | POA: Diagnosis present

## 2021-04-08 DIAGNOSIS — F319 Bipolar disorder, unspecified: Secondary | ICD-10-CM | POA: Diagnosis present

## 2021-04-08 DIAGNOSIS — I69354 Hemiplegia and hemiparesis following cerebral infarction affecting left non-dominant side: Secondary | ICD-10-CM

## 2021-04-08 DIAGNOSIS — Z833 Family history of diabetes mellitus: Secondary | ICD-10-CM

## 2021-04-08 DIAGNOSIS — C50412 Malignant neoplasm of upper-outer quadrant of left female breast: Secondary | ICD-10-CM | POA: Diagnosis present

## 2021-04-08 DIAGNOSIS — M810 Age-related osteoporosis without current pathological fracture: Secondary | ICD-10-CM | POA: Diagnosis present

## 2021-04-08 DIAGNOSIS — W010XXA Fall on same level from slipping, tripping and stumbling without subsequent striking against object, initial encounter: Secondary | ICD-10-CM | POA: Diagnosis present

## 2021-04-08 DIAGNOSIS — Z6831 Body mass index (BMI) 31.0-31.9, adult: Secondary | ICD-10-CM

## 2021-04-08 DIAGNOSIS — K5900 Constipation, unspecified: Secondary | ICD-10-CM | POA: Diagnosis not present

## 2021-04-08 DIAGNOSIS — S72492D Other fracture of lower end of left femur, subsequent encounter for closed fracture with routine healing: Secondary | ICD-10-CM | POA: Diagnosis not present

## 2021-04-08 DIAGNOSIS — S7292XA Unspecified fracture of left femur, initial encounter for closed fracture: Secondary | ICD-10-CM | POA: Diagnosis present

## 2021-04-08 DIAGNOSIS — Z79811 Long term (current) use of aromatase inhibitors: Secondary | ICD-10-CM

## 2021-04-08 DIAGNOSIS — D62 Acute posthemorrhagic anemia: Secondary | ICD-10-CM | POA: Diagnosis present

## 2021-04-08 DIAGNOSIS — T148XXA Other injury of unspecified body region, initial encounter: Secondary | ICD-10-CM

## 2021-04-08 DIAGNOSIS — S72462A Displaced supracondylar fracture with intracondylar extension of lower end of left femur, initial encounter for closed fracture: Secondary | ICD-10-CM | POA: Diagnosis present

## 2021-04-08 DIAGNOSIS — E66811 Obesity, class 1: Secondary | ICD-10-CM

## 2021-04-08 DIAGNOSIS — Z79899 Other long term (current) drug therapy: Secondary | ICD-10-CM

## 2021-04-08 DIAGNOSIS — Z419 Encounter for procedure for purposes other than remedying health state, unspecified: Secondary | ICD-10-CM

## 2021-04-08 DIAGNOSIS — S72009A Fracture of unspecified part of neck of unspecified femur, initial encounter for closed fracture: Secondary | ICD-10-CM | POA: Diagnosis not present

## 2021-04-08 LAB — RESP PANEL BY RT-PCR (FLU A&B, COVID) ARPGX2
Influenza A by PCR: NEGATIVE
Influenza B by PCR: NEGATIVE
SARS Coronavirus 2 by RT PCR: NEGATIVE

## 2021-04-08 LAB — BASIC METABOLIC PANEL
Anion gap: 9 (ref 5–15)
BUN: 14 mg/dL (ref 8–23)
CO2: 25 mmol/L (ref 22–32)
Calcium: 9.3 mg/dL (ref 8.9–10.3)
Chloride: 103 mmol/L (ref 98–111)
Creatinine, Ser: 0.74 mg/dL (ref 0.44–1.00)
GFR, Estimated: >60 mL/min (ref >60)
Glucose, Bld: 115 mg/dL — ABNORMAL HIGH (ref 70–99)
Potassium: 3.6 mmol/L (ref 3.5–5.1)
Sodium: 137 mmol/L (ref 135–145)

## 2021-04-08 LAB — CBC
HCT: 33.9 % — ABNORMAL LOW (ref 36.0–46.0)
Hemoglobin: 10.9 g/dL — ABNORMAL LOW (ref 12.0–15.0)
MCH: 28.4 pg (ref 26.0–34.0)
MCHC: 32.2 g/dL (ref 30.0–36.0)
MCV: 88.3 fL (ref 80.0–100.0)
Platelets: 208 10*3/uL (ref 150–400)
RBC: 3.84 MIL/uL — ABNORMAL LOW (ref 3.87–5.11)
RDW: 14.7 % (ref 11.5–15.5)
WBC: 10.6 10*3/uL — ABNORMAL HIGH (ref 4.0–10.5)
nRBC: 0 % (ref 0.0–0.2)

## 2021-04-08 MED ORDER — SENNOSIDES-DOCUSATE SODIUM 8.6-50 MG PO TABS: 1.0000 | ORAL_TABLET | Freq: Every evening | ORAL | Status: DC | PRN

## 2021-04-08 MED ORDER — PANTOPRAZOLE SODIUM 40 MG PO TBEC
40.0000 mg | DELAYED_RELEASE_TABLET | Freq: Every day | ORAL | Status: DC
  Administered 2021-04-09 – 2021-04-16 (×7): 40 mg via ORAL
  Filled 2021-04-08 (×7): qty 1

## 2021-04-08 MED ORDER — ATENOLOL 25 MG PO TABS
25.0000 mg | ORAL_TABLET | Freq: Every day | ORAL | Status: DC
Start: 2021-04-09 — End: 2021-04-14
  Administered 2021-04-11 – 2021-04-13 (×2): 25 mg via ORAL
  Filled 2021-04-08 (×3): qty 1

## 2021-04-08 MED ORDER — CYCLOBENZAPRINE HCL 5 MG PO TABS
7.5000 mg | ORAL_TABLET | Freq: Once | ORAL | Status: AC
  Administered 2021-04-08: 7.5 mg via ORAL
  Filled 2021-04-08: qty 1.5

## 2021-04-08 MED ORDER — GABAPENTIN 100 MG PO CAPS
100.0000 mg | ORAL_CAPSULE | Freq: Two times a day (BID) | ORAL | Status: DC
  Administered 2021-04-09 – 2021-04-16 (×13): 100 mg via ORAL
  Filled 2021-04-08 (×13): qty 1

## 2021-04-08 MED ORDER — MORPHINE SULFATE (PF) 2 MG/ML IV SOLN
0.5000 mg | INTRAVENOUS | Status: DC | PRN
  Administered 2021-04-09 – 2021-04-15 (×13): 0.5 mg via INTRAVENOUS
  Filled 2021-04-08 (×13): qty 1

## 2021-04-08 MED ORDER — HYDROCODONE-ACETAMINOPHEN 5-325 MG PO TABS
1.0000 | ORAL_TABLET | Freq: Four times a day (QID) | ORAL | Status: DC | PRN
  Administered 2021-04-09: 15:00:00 2 via ORAL
  Administered 2021-04-09: 22:00:00 1 via ORAL
  Administered 2021-04-09 (×2): 2 via ORAL
  Administered 2021-04-10: 1 via ORAL
  Administered 2021-04-10 – 2021-04-13 (×10): 2 via ORAL
  Administered 2021-04-14: 1 via ORAL
  Administered 2021-04-14: 2 via ORAL
  Filled 2021-04-08 (×2): qty 1
  Filled 2021-04-08 (×8): qty 2
  Filled 2021-04-08: qty 1
  Filled 2021-04-08 (×3): qty 2
  Filled 2021-04-08: qty 1
  Filled 2021-04-08 (×3): qty 2

## 2021-04-08 MED ORDER — BACITRACIN ZINC 500 UNIT/GM EX OINT
TOPICAL_OINTMENT | CUTANEOUS | Status: AC
  Filled 2021-04-08: qty 0.9

## 2021-04-08 MED ORDER — ANASTROZOLE 1 MG PO TABS
1.0000 mg | ORAL_TABLET | Freq: Every day | ORAL | Status: DC
  Administered 2021-04-11 – 2021-04-16 (×6): 1 mg via ORAL
  Filled 2021-04-08 (×7): qty 1

## 2021-04-08 MED ORDER — HYDROCHLOROTHIAZIDE 12.5 MG PO TABS: 25.0000 mg | ORAL_TABLET | Freq: Every day | ORAL | Status: DC

## 2021-04-08 MED ORDER — HYDROMORPHONE HCL 1 MG/ML IJ SOLN
0.5000 mg | Freq: Once | INTRAMUSCULAR | Status: AC
  Administered 2021-04-08: 0.5 mg via INTRAVENOUS
  Filled 2021-04-08: qty 1

## 2021-04-08 MED ORDER — AMLODIPINE BESYLATE 10 MG PO TABS
10.0000 mg | ORAL_TABLET | Freq: Every day | ORAL | Status: DC
  Administered 2021-04-09 – 2021-04-16 (×8): 10 mg via ORAL
  Filled 2021-04-08 (×2): qty 1
  Filled 2021-04-08: qty 2
  Filled 2021-04-08 (×3): qty 1
  Filled 2021-04-08: qty 2
  Filled 2021-04-08: qty 1

## 2021-04-08 MED ORDER — FENTANYL CITRATE PF 50 MCG/ML IJ SOSY
50.0000 ug | PREFILLED_SYRINGE | Freq: Once | INTRAMUSCULAR | Status: AC
  Administered 2021-04-08: 50 ug via INTRAVENOUS
  Filled 2021-04-08: qty 1

## 2021-04-08 NOTE — ED Notes (Signed)
Called ortho tech for assistance; will be here as soon as he is able

## 2021-04-08 NOTE — H&P (Signed)
History and Physical    Patient: Tammy [name] QBH:[id number] DOB: [birth year] DOA: 04/08/2021 DOS: the patient was seen and examined on 04/08/2021 PCP: [name], MD  Patient coming from: Home  Chief Complaint:  Chief Complaint  Patient presents with   Fall    HPI: Tammy [name] is a 66 y.o. 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   She was in the parking lot of a grocery store and tripped on a concrete block and fell straight onto her left knee.  Needed help from bystander to call EMS.  In the ED, left femur/knee Xray showed comminuated and displaced fracture of distal femur metaphysis. Ortho Dr. Kathaleen Bury plans to take to OR and request transfer to [facility] due to lack of OR availability at [facility].   CBC and BMP pending at time of admission.   Review of Systems: As mentioned in the history of present illness. All other systems reviewed and are negative. Past Medical History:  Diagnosis Date   Arthritis    Bipolar disorder ([place])    Breast cancer ([place])    Depression    Family history of breast cancer 07/10/2020   Family history of colon cancer 07/10/2020   Family history of ovarian cancer 07/10/2020   GERD (gastroesophageal reflux disease)    HTN (hypertension)    Stroke ([place]) 09/2012   slurred speech and left sided weakness/notes 10/04/2012   Past Surgical History:  Procedure Laterality Date   BREAST LUMPECTOMY WITH RADIOACTIVE SEED AND SENTINEL LYMPH NODE BIOPSY Left 07/26/2020   Procedure: LEFT BREAST LUMPECTOMY WITH RADIOACTIVE SEED AND SENTINEL LYMPH NODE BIOPSY;  Surgeon: [name], MD;  Location: [facility];  Service: General;  Laterality: Left;   [place]   fracture of leg     2 months ago   Social History:  reports that  she has been smoking cigarettes. She has a 20.00 pack-year smoking history. She has never used smokeless tobacco. She reports that she does not currently use alcohol. She reports that she does not use drugs.  No Known Allergies  Family History  Problem Relation Age of Onset   Other Mother        blood clots   Diabetes Brother    Neuropathy Brother        diabetic neuropathy   Diabetes Sister    Neuropathy Sister        diabetic neuropathy   Diabetes Paternal Aunt    Breast cancer Maternal Aunt        32   Breast cancer Other        PGM's sister; dx mid 68s   Colon cancer Maternal Aunt 69   Colon cancer Other        PGM's brother; dx 15s   Ovarian cancer Other        PGM's mother; dx unknown age    Prior to Admission medications   Medication Sig Start Date End Date Taking? Authorizing Provider  Acetaminophen (TYLENOL PO) Take by mouth as needed.    [provider]  amLODipine (NORVASC) 10 MG tablet Take 10 mg by mouth daily.    [provider]  anastrozole (ARIMIDEX) 1 MG tablet Take 1 tablet (1 mg total) by mouth daily. 03/05/21   Gardenia Phlegm, NP  atenolol (TENORMIN) 25 MG tablet  Take 25 mg by mouth daily.    [provider]  gabapentin (NEURONTIN) 300 MG capsule Take 1-2 capsules (300-600 mg total) by mouth 2 (two) times daily. 08/23/19   [name], MD  hydrochlorothiazide (HYDRODIURIL) 25 MG tablet Take 25 mg by mouth daily.    [provider]  IBUPROFEN PO Take by mouth as needed.    [provider]  lisinopril (ZESTRIL) 20 MG tablet Take 20 mg by mouth daily.    [provider]  Multiple Vitamin (MULTIVITAMIN PO) Take by mouth daily.    [provider]  pantoprazole (PROTONIX) 40 MG tablet Take 40 mg by mouth daily. 11/22/18   [provider]    Physical Exam: Vitals:   04/08/21 1847 04/08/21 1852  BP:  (!) 149/63  Pulse:  (!) 57  Resp:  18  Temp:  97.9 F (36.6 C)  TempSrc:   Oral  SpO2:  95%  Weight: 77.6 kg   Height: 5\' 2"  (1.575 m)    Constitutional: NAD, calm, comfortable, elderly female appearing younger than stated age laying flat in bed Eyes:  lids and conjunctivae normal ENMT: Mucous membranes are moist.   Neck: normal, supple Respiratory: clear to auscultation bilaterally, no wheezing, no crackles. Normal respiratory effort. No accessory muscle use.  Cardiovascular: Regular rate and rhythm, no murmurs / rubs / gallops. No extremity edema. 2+ pedal pulses.  Abdomen: no tenderness, no masses palpated. NBowel sounds positive.  Musculoskeletal: no clubbing / cyanosis.  Significant circumferential edema of the left knee.  Left lower extremity appears internally rotated and placed in splint immobilizer Skin: no rashes, lesions, ulcers.  Neurologic: CN 2-12 grossly intact.  Strength 5/5 in all 4.  Psychiatric: Normal judgment and insight. Alert and oriented x 3. Normal mood.  Data Reviewed:  CBC with anemia with hemoglobin 10.9, mild leukocytosis of 10.6.  BMP otherwise unremarkable.  Negative flu and COVID PCR.  Assessment and Plan:  Left comminuated and displaced fracture of distal femur metaphysis -ortho Dr. Kathaleen Bury plans to take to OR at [facility] -NPO after midnight -PRN Norco for moderate pain and IV morphine for severe pan   Anemia Hgb of 10.9. Has been downward trending since at least 9 months ago. Denies melena.  -check iron panel, vitamin X52 and folic  estrogen receptor positive breast cancer s/p left lumpectomy and radiation -continue anastrozole  Compensated cirrhosis secondary to Hep C Follows with [facility]   HTN -Continue amlodipine, atenolol, HCTZ  Hx of CVA  -Pt reports she has not been on aspirin since "no one prescribed it to her." Needs to resume after femur repair.      Advance Care Planning:   Code Status: Prior Full  Consults: Orthopedic  Family Communication: Discussed with  fianc [name] at bedside  Severity of Illness: The appropriate patient status for this patient is INPATIENT. Inpatient status is judged to be reasonable and necessary in order to provide the required intensity of service to ensure the patient's safety. The patient's presenting symptoms, physical exam findings, and initial radiographic and laboratory data in the context of their chronic comorbidities is felt to place them at high risk for further clinical deterioration. Furthermore, it is not anticipated that the patient will be medically stable for discharge from the hospital within 2 midnights of admission.   * I certify that at the point of admission it is my clinical judgment that the patient will require inpatient hospital care spanning beyond  2 midnights from the point of admission due to high intensity of service, high risk for further deterioration and high frequency of surveillance required.*  Author: Orene Desanctis, DO 04/08/2021 9:09 PM  For on call review [email or web address].

## 2021-04-08 NOTE — ED Triage Notes (Signed)
Ems brings pt in for a fall. Pt tripped over a curb and landed on left knee. Denies hitting head.

## 2021-04-08 NOTE — ED Provider Notes (Signed)
[facility] DEPT Provider Note   CSN: [id number] Arrival date & time: 04/08/21  1837     History  Chief Complaint  Patient presents with   Tammy [name] is a 66 y.o. female with medical history significant for bipolar disorder, breast cancer, depression, GERD, hypertension, arthritis.  Patient presents ED via EMS for evaluation of left knee pain.  Patient states that she was walking out of [employer] earlier today when she tripped on a curb causing her to land on her left knee.  The patient reports that all of her weight landed on her left knee.  Patient reports immediate pain after falling on the knee and immediate swelling to the joint.  Patient reports that she called EMS at this time who transported her to the ED for further evaluation.  Patient denies being on blood thinners or striking her head.  Patient endorses left knee pain.  Patient denies numbness, tingling, loss of consciousness, nausea, vomiting.   Fall      Home Medications Prior to Admission medications   Medication Sig Start Date End Date Taking? Authorizing Provider  Acetaminophen (TYLENOL PO) Take by mouth as needed.    [provider]  amLODipine (NORVASC) 10 MG tablet Take 10 mg by mouth daily.    [provider]  anastrozole (ARIMIDEX) 1 MG tablet Take 1 tablet (1 mg total) by mouth daily. 03/05/21   Gardenia Phlegm, NP  atenolol (TENORMIN) 25 MG tablet Take 25 mg by mouth daily.    [provider]  gabapentin (NEURONTIN) 300 MG capsule Take 1-2 capsules (300-600 mg total) by mouth 2 (two) times daily. 08/23/19   [name], MD  hydrochlorothiazide (HYDRODIURIL) 25 MG tablet Take 25 mg by mouth daily.    [provider]  IBUPROFEN PO Take by mouth as needed.    [provider]  lisinopril (ZESTRIL) 20 MG tablet Take 20 mg by mouth daily.    [provider]  Multiple Vitamin (MULTIVITAMIN PO)  Take by mouth daily.    [provider]  pantoprazole (PROTONIX) 40 MG tablet Take 40 mg by mouth daily. 11/22/18   [provider]      Allergies    Patient has no known allergies.    Review of Systems   Review of Systems  Gastrointestinal:  Negative for nausea and vomiting.  Musculoskeletal:  Positive for arthralgias and joint swelling.  Neurological:  Negative for syncope.  All other systems reviewed and are negative.  Physical Exam Updated Vital Signs BP (!) 142/84    Pulse 63    Temp 97.9 F (36.6 C) (Oral)    Resp 18    Ht 5\' 2"  (1.575 m)    Wt 77.6 kg    SpO2 100%    BMI 31.28 kg/m  Physical Exam Vitals and nursing note reviewed.  Constitutional:      General: She is not in acute distress.    Appearance: She is not ill-appearing, toxic-appearing or diaphoretic.  HENT:     Head: Normocephalic and atraumatic.     Nose: Nose normal.     Mouth/Throat:     Mouth: Mucous membranes are moist.  Eyes:     Extraocular Movements: Extraocular movements intact.     Conjunctiva/sclera: Conjunctivae normal.     Pupils: Pupils are equal, round, and reactive to light.  Cardiovascular:     Rate and Rhythm: Normal rate and regular rhythm.  Pulses:          Dorsalis pedis pulses are 2+ on the right side and 2+ on the left side.  Pulmonary:     Effort: Pulmonary effort is normal.     Breath sounds: Normal breath sounds. No wheezing.  Abdominal:     General: Abdomen is flat.     Palpations: Abdomen is soft.     Tenderness: There is no abdominal tenderness.  Musculoskeletal:     Cervical back: Normal range of motion and neck supple. No tenderness.     Right knee: Normal.     Left knee: Swelling and deformity present. No erythema, ecchymosis or lacerations. Decreased range of motion.     Comments: Obvious deformity and swelling noted to left knee.  Patient unable to extend or flex knee.  Patient is able to appropriately range ankle.  Patient neurovascularly intact  with 2+ DP pulse noted to left foot.  Skin:    General: Skin is warm and dry.     Capillary Refill: Capillary refill takes less than 2 seconds.  Neurological:     Mental Status: She is alert and oriented to person, place, and time.    ED Results / Procedures / Treatments   Labs (all labs ordered are listed, but only abnormal results are displayed) Labs Reviewed  CBC - Abnormal; Notable for the following components:      Result Value   WBC 10.6 (*)    RBC 3.84 (*)    Hemoglobin 10.9 (*)    HCT 33.9 (*)    All other components within normal limits  BASIC METABOLIC PANEL - Abnormal; Notable for the following components:   Glucose, Bld 115 (*)    All other components within normal limits  RESP PANEL BY RT-PCR (FLU A&B, COVID) [id number]    EKG None  Radiology DG Knee Complete 4 Views Left  Result Date: 04/08/2021 CLINICAL DATA:  Fall. EXAM: LEFT FEMUR 2 VIEWS; LEFT KNEE - COMPLETE 4+ VIEW COMPARISON:  None. FINDINGS: There is a comminuted and displaced fracture of the distal femoral metaphysis. There is posterior displacement of the distal fracture fragment in relation to the femoral shaft and approximately 3.5 cm overlap. There is impaction of the distal end of the proximal fracture fragment on the anterior aspect of the distal fracture fragment and on the patella. The bones are osteopenic. No dislocation. There is moderate arthritic changes of the left knee with tricompartmental narrowing. The soft tissue swelling of the knee. No radiopaque foreign object or soft tissue gas. IMPRESSION: 1. Comminuted and displaced fracture of the distal femoral metaphysis. No dislocation. 2. Osteopenia. 3. Moderate arthritic changes of the left knee. Electronically Signed   By: Anner Crete M.D.   On: 04/08/2021 20:10   DG Femur Min 2 Views Left  Result Date: 04/08/2021 CLINICAL DATA:  Fall. EXAM: LEFT FEMUR 2 VIEWS; LEFT KNEE - COMPLETE 4+ VIEW COMPARISON:  None. FINDINGS: There is a comminuted and  displaced fracture of the distal femoral metaphysis. There is posterior displacement of the distal fracture fragment in relation to the femoral shaft and approximately 3.5 cm overlap. There is impaction of the distal end of the proximal fracture fragment on the anterior aspect of the distal fracture fragment and on the patella. The bones are osteopenic. No dislocation. There is moderate arthritic changes of the left knee with tricompartmental narrowing. The soft tissue swelling of the knee. No radiopaque foreign object or soft tissue gas. IMPRESSION: 1. Comminuted and displaced  fracture of the distal femoral metaphysis. No dislocation. 2. Osteopenia. 3. Moderate arthritic changes of the left knee. Electronically Signed   By: Anner Crete M.D.   On: 04/08/2021 20:10    Procedures Procedures    Medications Ordered in ED Medications  bacitracin 500 UNIT/GM ointment (  Given 04/08/21 2134)  cyclobenzaprine (FLEXERIL) tablet 7.5 mg (7.5 mg Oral Given 04/08/21 2112)  fentaNYL (SUBLIMAZE) injection 50 mcg (50 mcg Intravenous Given 04/08/21 2057)    ED Course/ Medical Decision Making/ A&P                           Medical Decision Making Amount and/or Complexity of Data Reviewed Radiology: ordered.  Risk Prescription drug management.   66 year old female presents to ED for evaluation of left knee pain suffered after falling and landing on left knee.  On examination, the patient's left knee is swollen.  There is no overlying ecchymosis or erythema.  Patient has 2+ DP pulse to left foot, sensation intact.  Patient able to appropriately range left ankle.  Patient unable to appropriately range left knee either actively or passively secondary to pain.  Patient evaluated utilizing plain imaging of left knee.  Plan for imaging of left knee does not show any signs of dislocation or fracture of the knee. Patient evaluated also utilizing plain film imaging of femur.  Plain film imaging of femur does show  comminuted distal femoral metaphysis fracture.  At this time, hospitalist was paged for admission.  Dr. Flossie Buffy has agreed to admit the patient. Orthopedic surgery also consulted at this time.  Dr. Kathaleen Bury has agreed to admit patient for operation. Dr. Kathaleen Bury has requested that the patient be transferred to [facility] due to no OR availability at [facility]. Patient advised of plan and agreeable. Patient stable at time of admission.    Final Clinical Impression(s) / ED Diagnoses Final diagnoses:  Closed displaced comminuted fracture of shaft of left femur, initial encounter [facility])    Rx / Van Orders ED Discharge Orders     None         Lawana Chambers 04/08/21 [place], DO 04/08/21 2214

## 2021-04-09 ENCOUNTER — Encounter (HOSPITAL_COMMUNITY)
Admission: EM | Disposition: A | Discharge status: Skilled Nursing Facility | Payer: Self-pay | Source: Home / Self Care | Attending: Internal Medicine

## 2021-04-09 LAB — VITAMIN B12: Vitamin B-12: 875 pg/mL (ref 180–914)

## 2021-04-09 LAB — IRON AND TIBC
Iron: 72 ug/dL (ref 28–170)
Saturation Ratios: 16 % (ref 10.4–31.8)
TIBC: 449 ug/dL (ref 250–450)
UIBC: 377 ug/dL

## 2021-04-09 LAB — FOLATE: Folate: 23 ng/mL (ref >5.9)

## 2021-04-09 SURGERY — OPEN REDUCTION INTERNAL FIXATION (ORIF) DISTAL FEMUR FRACTURE
Anesthesia: General | Laterality: Left

## 2021-04-09 MED ORDER — ENOXAPARIN SODIUM 40 MG/0.4ML IJ SOSY
40.0000 mg | PREFILLED_SYRINGE | Freq: Every day | INTRAMUSCULAR | Status: DC
Start: 2021-04-09 — End: 2021-04-11
  Administered 2021-04-09: 40 mg via SUBCUTANEOUS
  Filled 2021-04-09: qty 0.4

## 2021-04-09 MED ORDER — HYDRALAZINE HCL 25 MG PO TABS
25.0000 mg | ORAL_TABLET | Freq: Two times a day (BID) | ORAL | Status: DC
Start: 2021-04-09 — End: 2021-04-16
  Administered 2021-04-09 – 2021-04-16 (×14): 25 mg via ORAL
  Filled 2021-04-09 (×15): qty 1

## 2021-04-09 NOTE — Consult Note (Addendum)
Patient ID: Tammy [name] MRN: [id number] DOB/AGE: Mar 03, 1955 66 y.o.  Admit date: 04/08/2021  Admission Diagnoses:  Principal Problem:   Femur fracture, left ([place]) Active Problems:   HTN (hypertension)   Malignant neoplasm of upper-outer quadrant of left breast in female, estrogen receptor positive ([place])   Anemia   History of CVA (cerebrovascular accident)   HPI: Ortho consulted for left distal femur fracture sustained on 04/08/21. The patient was walking outside [employer] when she tripped and landed directly on the left knee. XR and CT in ED showed the aforementioned injury. She denies any antecedent femur or knee pain. She did not know she had prior knee arthritis. Her PMH is notable for bipolar disorder, breast cancer, depression, GERD, hypertension, arthritis. She denies injuries elsewhere.   Past Medical History: Past Medical History:  Diagnosis Date   Arthritis    Bipolar disorder ([place])    Breast cancer ([place])    Depression    Family history of breast cancer 07/10/2020   Family history of colon cancer 07/10/2020   Family history of ovarian cancer 07/10/2020   GERD (gastroesophageal reflux disease)    HTN (hypertension)    Stroke ([place]) 09/2012   slurred speech and left sided weakness/notes 10/04/2012    Surgical History: Past Surgical History:  Procedure Laterality Date   BREAST LUMPECTOMY WITH RADIOACTIVE SEED AND SENTINEL LYMPH NODE BIOPSY Left 07/26/2020   Procedure: LEFT BREAST LUMPECTOMY WITH RADIOACTIVE SEED AND SENTINEL LYMPH NODE BIOPSY;  Surgeon: [name], MD;  Location: [facility];  Service: General;  Laterality: Left;   [place]   fracture of leg     2 months ago    Family History: Family History  Problem Relation Age of Onset   Other Mother        blood clots   Diabetes Brother    Neuropathy Brother        diabetic neuropathy   Diabetes Sister    Neuropathy Sister        diabetic neuropathy    Diabetes Paternal Aunt    Breast cancer Maternal Aunt        87   Breast cancer Other        PGM's sister; dx mid 66s   Colon cancer Maternal Aunt 69   Colon cancer Other        PGM's brother; dx 47s   Ovarian cancer Other        PGM's mother; dx unknown age    Social History: Social History   Socioeconomic History   Marital status: Significant Other    Spouse name: Not on file   Number of children: 2   Years of education: Not on file   Highest education level: Not on file  Occupational History   Occupation: diabled  Tobacco Use   Smoking status: Every Day    Packs/day: 0.50    Years: 40.00    Pack years: 20.00    Types: Cigarettes   Smokeless tobacco: Never   Tobacco comments:    has used e-cigarettes, no vaping   Vaping Use   Vaping Use: Never used  Substance and Sexual Activity   Alcohol use: Not Currently    Alcohol/week: 0.0 standard drinks    Comment: 1 per week   Drug use: No   Sexual activity: Not on file  Other Topics Concern   Not on file  Social History Narrative   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   Disabled  nurse   Right handed   Caffeine: 1 cup in the morning    Social Determinants of Health   Financial Resource Strain: Not on file  Food Insecurity: Not on file  Transportation Needs: Not on file  Physical Activity: Not on file  Stress: Not on file  Social Connections: Not on file  Intimate Partner Violence: Not on file    Allergies: Lisinopril  Medications: I have reviewed the patient's current medications.  Vital Signs: Patient Vitals for the past 24 hrs:  BP Temp Temp src Pulse Resp SpO2 Height Weight  04/09/21 0630 (!) 158/75 -- -- 81 19 97 % -- --  04/09/21 0600 (!) 148/73 -- -- 65 16 97 % -- --  04/09/21 0530 140/72 -- -- (!) 59 19 94 % -- --  04/09/21 0400 (!) 144/72 -- -- (!) 58 15 95 % -- --  04/09/21 0345 (!) 148/104 -- -- 62 16 93 % -- --  04/09/21 0330 (!) 143/73 -- -- (!) 59 14 93 % -- --  04/09/21 0315 (!) 145/77 -- -- 60 16 95  % -- --  04/09/21 0245 (!) 144/76 -- -- (!) 58 14 95 % -- --  04/09/21 0230 136/72 -- -- 65 14 93 % -- --  04/09/21 0215 (!) 143/77 -- -- 66 15 94 % -- --  04/09/21 0200 137/74 -- -- 64 15 95 % -- --  04/09/21 0145 138/68 -- -- 63 14 94 % -- --  04/09/21 0130 132/78 -- -- 65 15 96 % -- --  04/09/21 0115 (!) 142/70 -- -- 61 13 96 % -- --  04/09/21 0100 132/75 -- -- 74 16 93 % -- --  04/09/21 0045 (!) 141/70 -- -- 64 13 95 % -- --  04/09/21 0030 132/71 -- -- 63 13 95 % -- --  04/09/21 0015 (!) 158/85 -- -- 63 18 98 % -- --  04/09/21 0000 (!) 152/79 -- -- 70 15 100 % -- --  04/08/21 2200 (!) 147/80 -- -- 65 -- 99 % -- --  04/08/21 2100 (!) 142/84 -- -- 63 18 100 % -- --  04/08/21 1852 (!) 149/63 97.9 F (36.6 C) Oral (!) 57 18 95 % -- --  04/08/21 1847 -- -- -- -- -- -- 5\' 2"  (1.575 m) 77.6 kg    Radiology: CT Knee Left Wo Contrast  Result Date: 04/08/2021 CLINICAL DATA:  Fall, left knee fracture EXAM: CT OF THE LEFT KNEE WITHOUT CONTRAST TECHNIQUE: Multidetector CT imaging of the left knee was performed according to the standard protocol. Multiplanar CT image reconstructions were also generated. RADIATION DOSE REDUCTION: This exam was performed according to the departmental dose-optimization program which includes automated exposure control, adjustment of the mA and/or kV according to patient size and/or use of iterative reconstruction technique. COMPARISON:  None. FINDINGS: Bones/Joint/Cartilage There is a markedly comminuted fracture of the distal femur identified. The dominant fracture plane extends transversely through the distal metadiaphysis and there is 1 shaft with posterior displacement, override and marked posterior angulation of the distal fracture fragments. Additionally, there is a major fracture plane that extends longitudinally into the intercondylar notch of the distal femur resulting in separation of the femoral condyles which appear minimally displaced with resultant gap within  the intercondylar notch. Fracture fragments appear aligned, however. Medial and lateral compartment joint spaces appear aligned. Patella, visualized tibia, and visualized fibula are intact. Several small intra-articular loose bodies are present measuring up to 9 mm. Superimposed  mild to moderate tricompartmental degenerative arthritis of the knee with joint space narrowing and osteophyte formation, most severe within the medial compartment. Ligaments Suboptimally assessed by CT. Muscles and Tendons Unremarkable Soft tissues Large lipohemarthrosis is present. IMPRESSION: Markedly comminuted fracture of the distal right femur demonstrating both transverse and longitudinal dominant fracture planes with 1 shaft with posterior displacement, override, and posterior angulation of the distal fracture fragment consisting of the femoral condyles. Electronically Signed   By: Fidela Salisbury M.D.   On: 04/08/2021 22:04   DG Knee Complete 4 Views Left  Result Date: 04/08/2021 CLINICAL DATA:  Fall. EXAM: LEFT FEMUR 2 VIEWS; LEFT KNEE - COMPLETE 4+ VIEW COMPARISON:  None. FINDINGS: There is a comminuted and displaced fracture of the distal femoral metaphysis. There is posterior displacement of the distal fracture fragment in relation to the femoral shaft and approximately 3.5 cm overlap. There is impaction of the distal end of the proximal fracture fragment on the anterior aspect of the distal fracture fragment and on the patella. The bones are osteopenic. No dislocation. There is moderate arthritic changes of the left knee with tricompartmental narrowing. The soft tissue swelling of the knee. No radiopaque foreign object or soft tissue gas. IMPRESSION: 1. Comminuted and displaced fracture of the distal femoral metaphysis. No dislocation. 2. Osteopenia. 3. Moderate arthritic changes of the left knee. Electronically Signed   By: Anner Crete M.D.   On: 04/08/2021 20:10   DG Femur Min 2 Views Left  Result Date:  04/08/2021 CLINICAL DATA:  Fall. EXAM: LEFT FEMUR 2 VIEWS; LEFT KNEE - COMPLETE 4+ VIEW COMPARISON:  None. FINDINGS: There is a comminuted and displaced fracture of the distal femoral metaphysis. There is posterior displacement of the distal fracture fragment in relation to the femoral shaft and approximately 3.5 cm overlap. There is impaction of the distal end of the proximal fracture fragment on the anterior aspect of the distal fracture fragment and on the patella. The bones are osteopenic. No dislocation. There is moderate arthritic changes of the left knee with tricompartmental narrowing. The soft tissue swelling of the knee. No radiopaque foreign object or soft tissue gas. IMPRESSION: 1. Comminuted and displaced fracture of the distal femoral metaphysis. No dislocation. 2. Osteopenia. 3. Moderate arthritic changes of the left knee. Electronically Signed   By: Anner Crete M.D.   On: 04/08/2021 20:10    Labs: Recent Labs    04/08/21 2136  WBC 10.6*  RBC 3.84*  HCT 33.9*  PLT 208   Recent Labs    04/08/21 2136  NA 137  K 3.6  CL 103  CO2 25  BUN 14  CREATININE 0.74  GLUCOSE 115*  CALCIUM 9.3   No results for input(s): LABPT, INR in the last 72 hours.  Review of Systems: ROS as detailed in HPI  Physical Exam: Body mass index is 31.28 kg/m.  Physical Exam   Gen: AAOx3, NAD Comfortable at rest  Left Lower Extremity: Skin intact, moderate knee effusion TTP over left knee ADF/APF/EHL intact, restricted by pain No pain with passive stretch, compartments soft SILT throughout DP, PT 2+ to palp CR < 2s   Assessment and Plan: 43 yr F with left distal femur fracture sustained on 04/08/21 and PMH notable for bipolar disorder, breast cancer, depression, GERD, hypertension, arthritis  -pt's history exam and imaging reviewed at length with patient as well as her daughter Briarrose Shor [phone]) by phone  -she will require left distal femur ORIF -per discussion with  control  desk, there is no OR availability today at [facility]. [facility] request to [facility] was placed overnight pending bed availability -please continue to keep pt NPO and hold VTE chemoppx until transfer status known later today -NWB LLE, monitor pulses and exam -knee immobilizer in place, avoid tight application to prevent skin injury  Armond Hang, MD Orthopaedic Surgeon [facility] [phone]  The risks and benefits were presented and reviewed. The risks due to hardware failure/irritation, new/persistent infection, stiffness, nerve/vessel/tendon injury, nonunion/malunion, wound healing issues, development of arthritis, failure of this surgery, possibility of external fixation with delayed definitive surgery, need for further surgery, thromboembolic events, anesthesia/medical complications, amputation, death among others were discussed. The patient acknowledged the explanation, agreed to proceed with the plan.

## 2021-04-09 NOTE — Anesthesia Preprocedure Evaluation (Addendum)
Anesthesia Evaluation  ?Patient identified by MRN, date of birth, ID band ?Patient awake ? ? ? ?Reviewed: ?Allergy & Precautions, NPO status , Patient's Chart, lab work & pertinent test results ? ?Airway ?Mallampati: II ? ?TM Distance: >3 FB ?Neck ROM: Full ? ? ? Dental ?no notable dental hx. ?(+) Partial Lower, Partial Upper,  ?  ?Pulmonary ?Current Smoker and Patient abstained from smoking.,  ?  ?Pulmonary exam normal ?breath sounds clear to auscultation ? ? ? ? ? ? Cardiovascular ?hypertension, Pt. on medications ?Normal cardiovascular exam ?Rhythm:Regular Rate:Normal ? ? ?  ?Neuro/Psych ?PSYCHIATRIC DISORDERS Bipolar Disorder CVA, No Residual Symptoms   ? GI/Hepatic ?Neg liver ROS, GERD  ,  ?Endo/Other  ?Lab Results ?     Component                Value               Date                 ?     CREATININE               0.74                04/08/2021            ?     K                        3.6                 04/08/2021           ?    ? Renal/GU ?negative Renal ROS  ? ?  ?Musculoskeletal ? ?(+) Arthritis ,  ? Abdominal ?(+) + obese (BMI 31.28),   ?Peds ? Hematology ? ?(+) Blood dyscrasia, anemia , Lab Results ?     Component                Value               Date                 ?     WBC                      10.6 (H)            04/08/2021           ?     HGB                      10.9 (L)            04/08/2021           ?     HCT                      33.9 (L)            04/08/2021           ?     PLT                      208                 04/08/2021           ?   ?Anesthesia Other Findings ?L breast CA ? ?[id number] ? Reproductive/Obstetrics ? ?  ? ? ? ? ? ? ? ? ? ? ? ? ? ?  ?  ? ? ? ? ? ? ? ?  Anesthesia Physical ?Anesthesia Plan ? ?ASA: 3 ? ?Anesthesia Plan: General  ? ?Post-op Pain Management: Dilaudid IV  ? ?Induction: Intravenous ? ?PONV Risk Score and Plan: 3 and Midazolam, Ondansetron and Treatment may vary due to age or medical condition ? ?Airway Management Planned:  Oral ETT and Nasal ETT ? ?Additional Equipment: None ? ?Intra-op Plan:  ? ?Post-operative Plan: Extubation in OR ? ?Informed Consent: I have reviewed the patients History and Physical, chart, labs and discussed the procedure including the risks, benefits and alternatives for the proposed anesthesia with the patient or authorized representative who has indicated his/her understanding and acceptance.  ? ? ? ?Dental advisory given ? ?Plan Discussed with: CRNA and Anesthesiologist ? ?Anesthesia Plan Comments:   ? ? ? ? ? ?Anesthesia Quick Evaluation ? ?

## 2021-04-09 NOTE — ED Notes (Signed)
Call patients daughter Cecille Rubin when patient is transferred to [facility].  ?[phone] ?

## 2021-04-09 NOTE — ED Notes (Signed)
Spoke with Baxter Flattery from [facility] to get an update regarding pt placement; pt has not been assigned a bed yet; pt is aware of situation ?

## 2021-04-09 NOTE — Progress Notes (Addendum)
?PROGRESS NOTE ? ? ? ?Tammy [name]  KKX:[id number] DOB: [birth year] DOA: 04/08/2021 ?PCP: [name], MD  ? ? ? ?Brief Narrative:  ? ? ?Tammy [name] is a 66 y.o. 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  ? ?Subjective: ? ?Need as needed analgesic for pain ?She is wondering when is she going to have surgery ?Significant other at bedside ? ?Assessment & Plan: ? Principal Problem: ?  Femur fracture, left ([place]) ?Active Problems: ?  HTN (hypertension) ?  Malignant neoplasm of upper-outer quadrant of left breast in female, estrogen receptor positive ([place]) ?  Anemia ?  History of CVA (cerebrovascular accident) ? ? ? ?Assessment and Plan: ?No notes have been filed under this hospital service. ?Service: Hospitalist ? ? ?Left comminuated and displaced fracture of distal femur metaphysis ?-Reported from mechanical fall ?--PRN Norco for moderate pain and IV morphine for severe pan  ?-Ortho wanted patient to go to [facility] for surgery, will follow Ortho recommendation ?  ?Anemia, normocytic ?Hgb of 10.9. Has been downward trending since at least 9 months ago. Denies melena.  ?- iron panel, vitamin J69 and folic unremarkable ?-Likely anemia of chronic disease ?  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  ?Compensated cirrhosis secondary to Hep C ?Follows with [facility]  ?  ?HTN ?-Continue amlodipine, atenolol, ?-Hold HCTZ ?  ?Hx of CVA , report residual left sided weakness ?-Pt reports she has not been on aspirin since "no one prescribed it to her." Needs to resume after femur repair.  ?Report follows with neurology Dr. Jaynee Eagles ?  ?Obesity: Body mass index is 31.28 kg/m?[name]. ? ? ? ?I have Reviewed nursing  notes, Vitals, pain scores, I/o's, Lab results and  imaging results since pt's last encounter, details please see discussion above  ?I ordered the following labs:  ?Unresulted Labs (From admission, onward)  ? ? None  ? ?  ? ? ? ?DVT prophylaxis: enoxaparin (LOVENOX) injection 40 mg Start: 04/09/21 1600 ?SCDs Start: 04/08/21 2318 ? ? ?Code Status:   Code Status: Full Code ? ?Family Communication: Patient and significant other at bedside with permission ?Disposition:  ? ?Status is: Inpatient ? ? ?Dispo: The patient is from: Home ?             Anticipated d/c is to: TBD ?             Anticipated d/c date is: TBD, patient is to transfer to [facility] for surgery, expect to stay at [facility]. ? ?Antimicrobials:   ? ?Anti-infectives (From admission, onward)  ? ? None  ? ?  ? ? ? ? ? ?Objective: ?Vitals:  ? 04/09/21 1430 04/09/21 1600 04/09/21 1800 04/09/21 1900  ?BP: (!) 191/90 (!) 202/80 (!) 182/79 (!) 170/84  ?Pulse: 67 65 67 80  ?Resp: 16 19 15 14   ?Temp:      ?TempSrc:      ?SpO2: 99% 96% 96% 99%  ?Weight:      ?Height:      ? ?No intake or output data in the 24 hours ending 04/09/21 1955 ?Filed Weights  ? 04/08/21 1847  ?Weight: 77.6 kg  ? ? ?Examination: ? ?General exam: alert, awake, communicative,calm, NAD ?Respiratory system: Clear to auscultation. Respiratory effort normal. ?Cardiovascular system:  RRR.  ?Gastrointestinal system: Abdomen is nondistended, soft and nontender.  Normal bowel sounds heard. ?Central nervous system: Alert and oriented. No focal neurological deficits. ?Extremities: Tender to palpation over left knee with associated edema/effusion ?Skin: No rashes, lesions or ulcers ?Psychiatry: Judgement and insight appear normal. Mood & affect appropriate.  ? ? ? ?Data Reviewed: I have personally reviewed  labs and visualized  imaging studies since the last encounter and formulate the plan  ? ? ? ? ? ? ?Scheduled Meds: ? amLODipine  10 mg Oral Daily  ? anastrozole  1 mg Oral Daily  ? atenolol  25 mg  Oral Daily  ? enoxaparin (LOVENOX) injection  40 mg Subcutaneous Q1400  ? gabapentin  100 mg Oral BID  ? hydrALAZINE  25 mg Oral BID  ? pantoprazole  40 mg Oral Daily  ? ?Continuous Infusions: ? ? LOS: 1 day  ? ? ? ?Florencia Reasons, MD PhD FACP ?[facility] ? ?Available via Epic secure chat 7am-7pm for nonurgent issues ?Please page for urgent issues ?To page the attending provider between 7A-7P or the covering provider during after hours 7P-7A, please log into the web site [email or web address] and access using universal [place] password for that web site. If you do not have the password, please call the hospital operator. ? ? ? ?04/09/2021, 7:55 PM  ? ? ?

## 2021-04-09 NOTE — ED Notes (Signed)
Patient surgery rescheduled and will be picked up at 0730 in the AM  ?

## 2021-04-09 NOTE — ED Notes (Signed)
[facility] contacted for patient placement. ? ?Patient to be at short stay [place] at 11am 04/10/21 ?[facility] has been scheduled for pickup ?Short stay number [phone] ?

## 2021-04-10 ENCOUNTER — Inpatient Hospital Stay (HOSPITAL_COMMUNITY): Payer: Medicare Other

## 2021-04-10 ENCOUNTER — Other Ambulatory Visit: Payer: Self-pay

## 2021-04-10 ENCOUNTER — Inpatient Hospital Stay (HOSPITAL_COMMUNITY): Payer: Medicare Other | Admitting: Anesthesiology

## 2021-04-10 ENCOUNTER — Encounter (HOSPITAL_COMMUNITY): Payer: Self-pay | Admitting: Family Medicine

## 2021-04-10 ENCOUNTER — Encounter (HOSPITAL_COMMUNITY)
Admission: EM | Disposition: A | Discharge status: Skilled Nursing Facility | Payer: Self-pay | Source: Home / Self Care | Attending: Internal Medicine

## 2021-04-10 DIAGNOSIS — Z8673 Personal history of transient ischemic attack (TIA), and cerebral infarction without residual deficits: Secondary | ICD-10-CM

## 2021-04-10 DIAGNOSIS — S72462A Displaced supracondylar fracture with intracondylar extension of lower end of left femur, initial encounter for closed fracture: Secondary | ICD-10-CM

## 2021-04-10 DIAGNOSIS — I10 Essential (primary) hypertension: Secondary | ICD-10-CM

## 2021-04-10 DIAGNOSIS — D649 Anemia, unspecified: Secondary | ICD-10-CM

## 2021-04-10 HISTORY — PX: ORIF FEMUR FRACTURE: SHX2119

## 2021-04-10 LAB — SURGICAL PCR SCREEN
MRSA, PCR: NEGATIVE
Staphylococcus aureus: NEGATIVE

## 2021-04-10 LAB — BASIC METABOLIC PANEL
Anion gap: 12 (ref 5–15)
BUN: 13 mg/dL (ref 8–23)
CO2: 21 mmol/L — ABNORMAL LOW (ref 22–32)
Calcium: 9.2 mg/dL (ref 8.9–10.3)
Chloride: 100 mmol/L (ref 98–111)
Creatinine, Ser: 0.71 mg/dL (ref 0.44–1.00)
GFR, Estimated: >60 mL/min (ref >60)
Glucose, Bld: 117 mg/dL — ABNORMAL HIGH (ref 70–99)
Potassium: 4.3 mmol/L (ref 3.5–5.1)
Sodium: 133 mmol/L — ABNORMAL LOW (ref 135–145)

## 2021-04-10 LAB — CBC
HCT: 35.5 % — ABNORMAL LOW (ref 36.0–46.0)
Hemoglobin: 11.1 g/dL — ABNORMAL LOW (ref 12.0–15.0)
MCH: 27.8 pg (ref 26.0–34.0)
MCHC: 31.3 g/dL (ref 30.0–36.0)
MCV: 89 fL (ref 80.0–100.0)
Platelets: 199 10*3/uL (ref 150–400)
RBC: 3.99 MIL/uL (ref 3.87–5.11)
RDW: 14.3 % (ref 11.5–15.5)
WBC: 9.8 10*3/uL (ref 4.0–10.5)
nRBC: 0 % (ref 0.0–0.2)

## 2021-04-10 LAB — MAGNESIUM: Magnesium: 2.4 mg/dL (ref 1.7–2.4)

## 2021-04-10 SURGERY — OPEN REDUCTION INTERNAL FIXATION (ORIF) DISTAL FEMUR FRACTURE
Anesthesia: General | Site: Leg Upper | Laterality: Left

## 2021-04-10 MED ORDER — CHLORHEXIDINE GLUCONATE 0.12 % MT SOLN
OROMUCOSAL | Status: AC
  Filled 2021-04-10: qty 15

## 2021-04-10 MED ORDER — CHLORHEXIDINE GLUCONATE 4 % EX LIQD: 60.0000 mL | Freq: Once | CUTANEOUS | Status: DC

## 2021-04-10 MED ORDER — ONDANSETRON HCL 4 MG/2ML IJ SOLN
INTRAMUSCULAR | Status: AC
  Filled 2021-04-10: qty 2

## 2021-04-10 MED ORDER — ORAL CARE MOUTH RINSE: 15.0000 mL | Freq: Once | OROMUCOSAL | Status: DC

## 2021-04-10 MED ORDER — CEFAZOLIN SODIUM-DEXTROSE 2-4 GM/100ML-% IV SOLN
2.0000 g | INTRAVENOUS | Status: AC
  Administered 2021-04-10: 2 g via INTRAVENOUS

## 2021-04-10 MED ORDER — HYDROMORPHONE HCL 1 MG/ML IJ SOLN
INTRAMUSCULAR | Status: DC | PRN
Start: 2021-04-10 — End: 2021-04-10
  Administered 2021-04-10: .5 mg via INTRAVENOUS

## 2021-04-10 MED ORDER — FENTANYL CITRATE (PF) 250 MCG/5ML IJ SOLN
INTRAMUSCULAR | Status: DC | PRN
  Administered 2021-04-10 (×3): 50 ug via INTRAVENOUS
  Administered 2021-04-10: 100 ug via INTRAVENOUS

## 2021-04-10 MED ORDER — PHENYLEPHRINE 40 MCG/ML (10ML) SYRINGE FOR IV PUSH (FOR BLOOD PRESSURE SUPPORT)
PREFILLED_SYRINGE | INTRAVENOUS | Status: DC | PRN
Start: 2021-04-10 — End: 2021-04-10
  Administered 2021-04-10: 40 ug via INTRAVENOUS
  Administered 2021-04-10: 80 ug via INTRAVENOUS

## 2021-04-10 MED ORDER — SUGAMMADEX SODIUM 200 MG/2ML IV SOLN
INTRAVENOUS | Status: DC | PRN
  Administered 2021-04-10: 200 mg via INTRAVENOUS

## 2021-04-10 MED ORDER — ROCURONIUM BROMIDE 10 MG/ML (PF) SYRINGE
PREFILLED_SYRINGE | INTRAVENOUS | Status: DC | PRN
Start: 2021-04-10 — End: 2021-04-10
  Administered 2021-04-10: 70 mg via INTRAVENOUS
  Administered 2021-04-10: 20 mg via INTRAVENOUS

## 2021-04-10 MED ORDER — CYCLOBENZAPRINE HCL 5 MG PO TABS
7.5000 mg | ORAL_TABLET | Freq: Once | ORAL | Status: AC
  Administered 2021-04-10: 7.5 mg via ORAL
  Filled 2021-04-10: qty 1.5

## 2021-04-10 MED ORDER — LACTATED RINGERS IV SOLN: INTRAVENOUS | Status: DC

## 2021-04-10 MED ORDER — DEXAMETHASONE SODIUM PHOSPHATE 10 MG/ML IJ SOLN
INTRAMUSCULAR | Status: DC | PRN
  Administered 2021-04-10: 5 mg via INTRAVENOUS

## 2021-04-10 MED ORDER — PROPOFOL 10 MG/ML IV BOLUS
INTRAVENOUS | Status: DC | PRN
  Administered 2021-04-10: 100 mg via INTRAVENOUS

## 2021-04-10 MED ORDER — MUPIROCIN 2 % EX OINT
1.0000 "application " | TOPICAL_OINTMENT | Freq: Two times a day (BID) | CUTANEOUS | Status: AC
  Administered 2021-04-10 – 2021-04-14 (×6): 1 via NASAL
  Filled 2021-04-10 (×2): qty 22

## 2021-04-10 MED ORDER — LIDOCAINE 2% (20 MG/ML) 5 ML SYRINGE
INTRAMUSCULAR | Status: DC | PRN
Start: 2021-04-10 — End: 2021-04-10
  Administered 2021-04-10: 100 mg via INTRAVENOUS

## 2021-04-10 MED ORDER — DEXAMETHASONE SODIUM PHOSPHATE 10 MG/ML IJ SOLN
INTRAMUSCULAR | Status: AC
  Filled 2021-04-10: qty 1

## 2021-04-10 MED ORDER — FENTANYL CITRATE (PF) 250 MCG/5ML IJ SOLN
INTRAMUSCULAR | Status: AC
  Filled 2021-04-10: qty 5

## 2021-04-10 MED ORDER — VANCOMYCIN HCL 1000 MG IV SOLR
INTRAVENOUS | Status: AC
  Filled 2021-04-10: qty 20

## 2021-04-10 MED ORDER — POVIDONE-IODINE 10 % EX SWAB: 2.0000 "application " | Freq: Once | CUTANEOUS | Status: DC

## 2021-04-10 MED ORDER — ONDANSETRON HCL 4 MG/2ML IJ SOLN
INTRAMUSCULAR | Status: DC | PRN
  Administered 2021-04-10: 4 mg via INTRAVENOUS

## 2021-04-10 MED ORDER — CEFAZOLIN SODIUM-DEXTROSE 2-4 GM/100ML-% IV SOLN
INTRAVENOUS | Status: AC
  Filled 2021-04-10: qty 100

## 2021-04-10 MED ORDER — ROCURONIUM BROMIDE 10 MG/ML (PF) SYRINGE
PREFILLED_SYRINGE | INTRAVENOUS | Status: AC
  Filled 2021-04-10: qty 10

## 2021-04-10 MED ORDER — HYDROMORPHONE HCL 1 MG/ML IJ SOLN
INTRAMUSCULAR | Status: AC
  Filled 2021-04-10: qty 0.5

## 2021-04-10 MED ORDER — CHLORHEXIDINE GLUCONATE 0.12 % MT SOLN: 15.0000 mL | Freq: Once | OROMUCOSAL | Status: DC

## 2021-04-10 MED ORDER — ATENOLOL 50 MG PO TABS
25.0000 mg | ORAL_TABLET | Freq: Once | ORAL | Status: AC
  Administered 2021-04-10: 25 mg via ORAL
  Filled 2021-04-10: qty 0.5

## 2021-04-10 SURGICAL SUPPLY — 79 items
BAG COUNTER SPONGE SURGICOUNT (BAG) ×3 IMPLANT
BAG SPNG CNTER NS LX DISP (BAG) ×1
BIT DRILL 4.3 (BIT) ×2
BIT DRILL 4.3X300MM (BIT) IMPLANT
BIT DRILL LONG 3.3 (BIT) ×2 IMPLANT
BIT DRILL QC 3.3X195 (BIT) ×1 IMPLANT
BLADE CLIPPER SURG (BLADE) IMPLANT
BNDG ELASTIC 4X5.8 VLCR STR LF (GAUZE/BANDAGES/DRESSINGS) ×3 IMPLANT
BNDG ELASTIC 6X5.8 VLCR STR LF (GAUZE/BANDAGES/DRESSINGS) ×3 IMPLANT
BNDG GAUZE ELAST 4 BULKY (GAUZE/BANDAGES/DRESSINGS) ×3 IMPLANT
BRUSH SCRUB EZ PLAIN DRY (MISCELLANEOUS) ×6 IMPLANT
CANISTER SUCT 3000ML PPV (MISCELLANEOUS) ×3 IMPLANT
CAP LOCK NCB (Cap) ×6 IMPLANT
COVER SURGICAL LIGHT HANDLE (MISCELLANEOUS) ×3 IMPLANT
DRAPE C-ARM 42X72 X-RAY (DRAPES) ×3 IMPLANT
DRAPE C-ARMOR (DRAPES) ×3 IMPLANT
DRAPE IMP U-DRAPE 54X76 (DRAPES) ×3 IMPLANT
DRAPE ORTHO SPLIT 77X108 STRL (DRAPES) ×6
DRAPE SURG ORHT 6 SPLT 77X108 (DRAPES) ×6 IMPLANT
DRAPE U-SHAPE 47X51 STRL (DRAPES) ×3 IMPLANT
DRESSING MEPILEX FLEX 4X4 (GAUZE/BANDAGES/DRESSINGS) IMPLANT
DRSG ADAPTIC 3X8 NADH LF (GAUZE/BANDAGES/DRESSINGS) ×3 IMPLANT
DRSG MEPILEX BORDER 4X12 (GAUZE/BANDAGES/DRESSINGS) ×1 IMPLANT
DRSG MEPILEX FLEX 4X4 (GAUZE/BANDAGES/DRESSINGS) ×2
DRSG PAD ABDOMINAL 8X10 ST (GAUZE/BANDAGES/DRESSINGS) ×12 IMPLANT
ELECT REM PT RETURN 9FT ADLT (ELECTROSURGICAL) ×2
ELECTRODE REM PT RTRN 9FT ADLT (ELECTROSURGICAL) ×2 IMPLANT
EVACUATOR 1/8 PVC DRAIN (DRAIN) IMPLANT
EVACUATOR 3/16  PVC DRAIN (DRAIN)
EVACUATOR 3/16 PVC DRAIN (DRAIN) IMPLANT
GAUZE SPONGE 4X4 12PLY STRL (GAUZE/BANDAGES/DRESSINGS) ×3 IMPLANT
GLOVE SRG 8 PF TXTR STRL LF DI (GLOVE) ×2 IMPLANT
GLOVE SURG ENC MOIS LTX SZ8 (GLOVE) ×3 IMPLANT
GLOVE SURG ORTHO LTX SZ7.5 (GLOVE) ×6 IMPLANT
GLOVE SURG UNDER POLY LF SZ7.5 (GLOVE) ×3 IMPLANT
GLOVE SURG UNDER POLY LF SZ8 (GLOVE) ×2
GOWN STRL REUS W/ TWL LRG LVL3 (GOWN DISPOSABLE) ×4 IMPLANT
GOWN STRL REUS W/ TWL XL LVL3 (GOWN DISPOSABLE) ×2 IMPLANT
GOWN STRL REUS W/TWL LRG LVL3 (GOWN DISPOSABLE) ×4
GOWN STRL REUS W/TWL XL LVL3 (GOWN DISPOSABLE) ×2
K-WIRE 2.0 (WIRE) ×2
K-WIRE FXSTD 280X2XNS SS (WIRE) ×1
KIT BASIN OR (CUSTOM PROCEDURE TRAY) ×3 IMPLANT
KIT TURNOVER KIT B (KITS) ×3 IMPLANT
KWIRE FXSTD 280X2XNS SS (WIRE) IMPLANT
NEEDLE 22X1 1/2 (OR ONLY) (NEEDLE) IMPLANT
NS IRRIG 1000ML POUR BTL (IV SOLUTION) ×3 IMPLANT
PACK TOTAL JOINT (CUSTOM PROCEDURE TRAY) ×3 IMPLANT
PACK UNIVERSAL I (CUSTOM PROCEDURE TRAY) ×3 IMPLANT
PAD ARMBOARD 7.5X6 YLW CONV (MISCELLANEOUS) ×6 IMPLANT
PAD CAST 4YDX4 CTTN HI CHSV (CAST SUPPLIES) ×2 IMPLANT
PADDING CAST COTTON 4X4 STRL (CAST SUPPLIES) ×2
PADDING CAST COTTON 6X4 STRL (CAST SUPPLIES) ×3 IMPLANT
PLATE BONE LOCK 238MM 9HOLE (Plate) ×1 IMPLANT
SCREW 5.0 32MM (Screw) ×1 IMPLANT
SCREW 5.0 70MM (Screw) ×1 IMPLANT
SCREW 5.0 80MM (Screw) ×2 IMPLANT
SCREW NCB 3.5X75X5X6.2XST (Screw) IMPLANT
SCREW NCB 4.0 32MM (Screw) ×1 IMPLANT
SCREW NCB 5.0X34MM (Screw) ×2 IMPLANT
SCREW NCB 5.0X38 (Screw) ×1 IMPLANT
SCREW NCB 5.0X75MM (Screw) ×6 IMPLANT
SPONGE T-LAP 18X18 ~~LOC~~+RFID (SPONGE) ×3 IMPLANT
STAPLER VISISTAT 35W (STAPLE) ×3 IMPLANT
SUCTION FRAZIER HANDLE 10FR (MISCELLANEOUS) ×2
SUCTION TUBE FRAZIER 10FR DISP (MISCELLANEOUS) ×2 IMPLANT
SUT ETHILON 2 0 PSLX (SUTURE) ×2 IMPLANT
SUT PROLENE 0 CT 2 (SUTURE) IMPLANT
SUT VIC AB 0 CT1 27 (SUTURE) ×2
SUT VIC AB 0 CT1 27XBRD ANBCTR (SUTURE) ×4 IMPLANT
SUT VIC AB 1 CT1 27 (SUTURE) ×4
SUT VIC AB 1 CT1 27XBRD ANBCTR (SUTURE) ×4 IMPLANT
SUT VIC AB 2-0 CT1 27 (SUTURE) ×2
SUT VIC AB 2-0 CT1 TAPERPNT 27 (SUTURE) ×4 IMPLANT
SYR 20ML ECCENTRIC (SYRINGE) IMPLANT
TOWEL GREEN STERILE (TOWEL DISPOSABLE) ×6 IMPLANT
TOWEL GREEN STERILE FF (TOWEL DISPOSABLE) ×3 IMPLANT
TRAY FOLEY MTR SLVR 16FR STAT (SET/KITS/TRAYS/PACK) IMPLANT
WATER STERILE IRR 1000ML POUR (IV SOLUTION) ×6 IMPLANT

## 2021-04-10 NOTE — Transfer of Care (Signed)
Immediate Anesthesia Transfer of Care Note ? ?Patient: Tkeya Stencil ? ?Procedure(s) Performed: OPEN REDUCTION INTERNAL FIXATION (ORIF) DISTAL FEMUR FRACTURE (Left: Leg Upper) ? ?Patient Location: PACU ? ?Anesthesia Type:General ? ?Level of Consciousness: awake, alert , oriented and patient cooperative ? ?Airway & Oxygen Therapy: Patient Spontanous Breathing and Patient connected to face mask oxygen ? ?Post-op Assessment: Report given to RN, Post -op Vital signs reviewed and stable and Patient moving all extremities X 4 ? ?Post vital signs: Reviewed and stable ? ?Last Vitals:  ?Vitals Value Taken Time  ?BP 127/77 04/10/21 1313  ?Temp    ?Pulse 59 04/10/21 1314  ?Resp 15 04/10/21 1314  ?SpO2 100 % 04/10/21 1314  ?Vitals shown include unvalidated device data. ? ?Last Pain:  ?Vitals:  ? 04/10/21 0833  ?TempSrc:   ?PainSc: 7   ?   ? ?Patients Stated Pain Goal: 3 (04/10/21 3300) ? ?Complications: No notable events documented. ?

## 2021-04-10 NOTE — Consult Note (Signed)
Orthopaedic Trauma Service (OTS) Consultation   Patient ID: Tammy [name] MRN: [id number] DOB/AGE: 03/17/1955 66 y.o.   Reason for Consult: Severely comminuted left distal femur fracture Referring Physician: Armond Hang, MD  HPI: Tammy [name] is an 66 y.o. female with ground level fall resulting in severely comminuted and displaced distal femur fracture with intra-articular extension. She denies other injury, associated numbness, and antecedent knee pain. Given the complexity of the distal femur fracture, Dr. Kathaleen Bury asserted this was outside his scope of practice and that it would be in the best interest of the patient to have these injuries evaluated and treated by a fellowship trained orthopaedic traumatologist. Consequently, I was consulted to provide further evaluation and management.   Past Medical History:  Diagnosis Date   Arthritis    Bipolar disorder ([place])    Breast cancer ([place])    Depression    Family history of breast cancer 07/10/2020   Family history of colon cancer 07/10/2020   Family history of ovarian cancer 07/10/2020   GERD (gastroesophageal reflux disease)    HTN (hypertension)    Stroke ([place]) 09/2012   slurred speech and left sided weakness/notes 10/04/2012    Past Surgical History:  Procedure Laterality Date   BREAST LUMPECTOMY WITH RADIOACTIVE SEED AND SENTINEL LYMPH NODE BIOPSY Left 07/26/2020   Procedure: LEFT BREAST LUMPECTOMY WITH RADIOACTIVE SEED AND SENTINEL LYMPH NODE BIOPSY;  Surgeon: [name], MD;  Location: [place];  Service: General;  Laterality: Left;   CESAREAN SECTION  1986   fracture of leg     2 months ago    Family History  Problem Relation Age of Onset   Other Mother        blood clots   Diabetes Brother    Neuropathy Brother        diabetic neuropathy   Diabetes Sister    Neuropathy Sister        diabetic neuropathy   Diabetes Paternal Aunt    Breast cancer  Maternal Aunt        7   Breast cancer Other        PGM's sister; dx mid 58s   Colon cancer Maternal Aunt 69   Colon cancer Other        PGM's brother; dx 9s   Ovarian cancer Other        PGM's mother; dx unknown age    Social History:  reports that she has been smoking cigarettes. She has a 20.00 pack-year smoking history. She has never used smokeless tobacco. She reports that she does not currently use alcohol. She reports that she does not use drugs.  Allergies:  Allergies  Allergen Reactions   Lisinopril Cough    Medications: Prior to Admission:  Medications Prior to Admission  Medication Sig Dispense Refill Last Dose   acetaminophen (TYLENOL) 500 MG tablet Take 1,000 mg by mouth every 6 (six) hours as needed for mild pain.   04/08/2021   amLODipine (NORVASC) 10 MG tablet Take 10 mg by mouth daily.   04/08/2021   anastrozole (ARIMIDEX) 1 MG tablet Take 1 tablet (1 mg total) by mouth daily. 90 tablet 4 04/08/2021   atenolol (TENORMIN) 25 MG tablet Take 25 mg by mouth daily.   04/08/2021 at 1000   gabapentin (NEURONTIN) 100 MG capsule Take 200 mg by mouth at bedtime.   04/08/2021   hydrALAZINE (APRESOLINE) 25 MG tablet Take 25 mg by mouth 2 (two)  times daily.   04/08/2021   hydrochlorothiazide (HYDRODIURIL) 25 MG tablet Take 25 mg by mouth daily.   04/08/2021   HYDROcodone-acetaminophen (NORCO/VICODIN) 5-325 MG tablet Take 1 tablet by mouth every 4 (four) hours as needed for pain.   04/08/2021   meloxicam (MOBIC) 15 MG tablet Take 15 mg by mouth daily.   04/08/2021   Multiple Vitamin (MULTIVITAMIN PO) Take 1 tablet by mouth daily.   04/08/2021   pantoprazole (PROTONIX) 40 MG tablet Take 40 mg by mouth daily.   04/08/2021   buPROPion (WELLBUTRIN SR) 150 MG 12 hr tablet Take 150 mg by mouth 2 (two) times daily.      gabapentin (NEURONTIN) 300 MG capsule Take 1-2 capsules (300-600 mg total) by mouth 2 (two) times daily. (Patient not taking: Reported on 04/09/2021) 360 capsule 2 Not Taking     Results for orders placed or performed during the hospital encounter of 04/08/21 (from the past 48 hour(s))  CBC     Status: Abnormal   Collection Time: 04/08/21  9:36 PM  Result Value Ref Range   WBC 10.6 (H) 4.0 - 10.5 K/uL   RBC 3.84 (L) 3.87 - 5.11 MIL/uL   Hemoglobin 10.9 (L) 12.0 - 15.0 g/dL   HCT 33.9 (L) 36.0 - 46.0 %   MCV 88.3 80.0 - 100.0 fL   MCH 28.4 26.0 - 34.0 pg   MCHC 32.2 30.0 - 36.0 g/dL   RDW 14.7 11.5 - 15.5 %   Platelets 208 150 - 400 K/uL   nRBC 0.0 0.0 - 0.2 %    Comment: Performed at [facility], [place] [address], [place], [place] [address]  Basic metabolic panel     Status: Abnormal   Collection Time: 04/08/21  9:36 PM  Result Value Ref Range   Sodium 137 135 - 145 mmol/L   Potassium 3.6 3.5 - 5.1 mmol/L   Chloride 103 98 - 111 mmol/L   CO2 25 22 - 32 mmol/L   Glucose, Bld 115 (H) 70 - 99 mg/dL    Comment: Glucose reference range applies only to samples taken after fasting for at least 8 hours.   BUN 14 8 - 23 mg/dL   Creatinine, Ser 0.74 0.44 - 1.00 mg/dL   Calcium 9.3 8.9 - 10.3 mg/dL   GFR, Estimated >60 >60 mL/min    Comment: (NOTE) Calculated using the CKD-EPI Creatinine Equation (2021)    Anion gap 9 5 - 15    Comment: Performed at [facility], [place] [address], [place], [place] [address]  Resp Panel by RT-PCR (Flu A&B, Covid) Nasopharyngeal Swab     Status: None   Collection Time: 04/08/21  9:37 PM   Specimen: Nasopharyngeal Swab; Nasopharyngeal(NP) swabs in vial transport medium  Result Value Ref Range   SARS Coronavirus 2 by RT PCR NEGATIVE NEGATIVE    Comment: (NOTE) SARS-CoV-2 target nucleic acids are NOT DETECTED.  The SARS-CoV-2 RNA is generally detectable in upper respiratory specimens during the acute phase of infection. The lowest concentration of SARS-CoV-2 viral copies this assay can detect is 138 copies/mL. A negative result does not preclude SARS-Cov-2 infection and should not be used  as the sole basis for treatment or other patient management decisions. A negative result may occur with  improper specimen collection/handling, submission of specimen other than nasopharyngeal swab, presence of viral mutation(s) within the areas targeted by this assay, and inadequate number of viral copies(<138 copies/mL). A negative result must be combined with  clinical observations, patient history, and epidemiological information. The expected result is Negative.  Fact Sheet for Patients:  EntrepreneurPulse.com.au  Fact Sheet for Healthcare Providers:  IncredibleEmployment.be  This test is no t yet approved or cleared by the [place] FDA and  has been authorized for detection and/or diagnosis of SARS-CoV-2 by FDA under an Emergency Use Authorization (EUA). This EUA will remain  in effect (meaning this test can be used) for the duration of the COVID-19 declaration under Section 564(b)(1) of the Act, 21 U.S.C.section 360bbb-3(b)(1), unless the authorization is terminated  or revoked sooner.       Influenza A by PCR NEGATIVE NEGATIVE   Influenza B by PCR NEGATIVE NEGATIVE    Comment: (NOTE) The Xpert Xpress SARS-CoV-2/FLU/RSV plus assay is intended as an aid in the diagnosis of influenza from Nasopharyngeal swab specimens and should not be used as a sole basis for treatment. Nasal washings and aspirates are unacceptable for Xpert Xpress SARS-CoV-2/FLU/RSV testing.  Fact Sheet for Patients: EntrepreneurPulse.com.au  Fact Sheet for Healthcare Providers: IncredibleEmployment.be  This test is not yet approved or cleared by the [place] FDA and has been authorized for detection and/or diagnosis of SARS-CoV-2 by FDA under an Emergency Use Authorization (EUA). This EUA will remain in effect (meaning this test can be used) for the duration of the COVID-19 declaration under Section 564(b)(1) of the Act,  21 U.S.C. section 360bbb-3(b)(1), unless the authorization is terminated or revoked.  Performed at [facility], [place] [address] [place], [place] [address]   Iron and TIBC     Status: None   Collection Time: 04/09/21  5:02 AM  Result Value Ref Range   Iron 72 28 - 170 ug/dL   TIBC 449 250 - 450 ug/dL   Saturation Ratios 16 10.4 - 31.8 %   UIBC 377 ug/dL    Comment: Performed at [facility], [place] [address], [place], [place] [address]  Vitamin B12     Status: None   Collection Time: 04/09/21  5:02 AM  Result Value Ref Range   Vitamin B-12 875 180 - 914 pg/mL    Comment: (NOTE) This assay is not validated for testing neonatal or myeloproliferative syndrome specimens for Vitamin B12 levels. Performed at [facility], [place] [address] [place], [place] [address]   Folate, serum, performed at [facility] lab     Status: None   Collection Time: 04/09/21  5:03 AM  Result Value Ref Range   Folate 23.0 >5.9 ng/mL    Comment: Performed at [facility], [place] [address], [place], [place] [address]  CBC     Status: Abnormal   Collection Time: 04/10/21  5:36 AM  Result Value Ref Range   WBC 9.8 4.0 - 10.5 K/uL   RBC 3.99 3.87 - 5.11 MIL/uL   Hemoglobin 11.1 (L) 12.0 - 15.0 g/dL   HCT 35.5 (L) 36.0 - 46.0 %   MCV 89.0 80.0 - 100.0 fL   MCH 27.8 26.0 - 34.0 pg   MCHC 31.3 30.0 - 36.0 g/dL   RDW 14.3 11.5 - 15.5 %   Platelets 199 150 - 400 K/uL   nRBC 0.0 0.0 - 0.2 %    Comment: Performed at [facility], [place] [address], [place], [place] [address]  Basic metabolic panel     Status: Abnormal   Collection Time: 04/10/21  5:36 AM  Result Value Ref Range   Sodium 133 (L) 135 - 145  mmol/L   Potassium 4.3 3.5 - 5.1 mmol/L   Chloride 100 98 - 111 mmol/L   CO2 21 (L) 22 - 32 mmol/L   Glucose, Bld 117 (H) 70 - 99 mg/dL    Comment: Glucose reference range applies only to samples taken after fasting  for at least 8 hours.   BUN 13 8 - 23 mg/dL   Creatinine, Ser 0.71 0.44 - 1.00 mg/dL   Calcium 9.2 8.9 - 10.3 mg/dL   GFR, Estimated >60 >60 mL/min    Comment: (NOTE) Calculated using the CKD-EPI Creatinine Equation (2021)    Anion gap 12 5 - 15    Comment: Performed at [facility], [place] [address], [place], [place] [address]  Magnesium     Status: None   Collection Time: 04/10/21  5:36 AM  Result Value Ref Range   Magnesium 2.4 1.7 - 2.4 mg/dL    Comment: Performed at [facility], [place] [address], [place], [place] [address]    CT Knee Left Wo Contrast  Result Date: 04/08/2021 CLINICAL DATA:  Fall, left knee fracture EXAM: CT OF THE LEFT KNEE WITHOUT CONTRAST TECHNIQUE: Multidetector CT imaging of the left knee was performed according to the standard protocol. Multiplanar CT image reconstructions were also generated. RADIATION DOSE REDUCTION: This exam was performed according to the departmental dose-optimization program which includes automated exposure control, adjustment of the mA and/or kV according to patient size and/or use of iterative reconstruction technique. COMPARISON:  None. FINDINGS: Bones/Joint/Cartilage There is a markedly comminuted fracture of the distal femur identified. The dominant fracture plane extends transversely through the distal metadiaphysis and there is 1 shaft with posterior displacement, override and marked posterior angulation of the distal fracture fragments. Additionally, there is a major fracture plane that extends longitudinally into the intercondylar notch of the distal femur resulting in separation of the femoral condyles which appear minimally displaced with resultant gap within the intercondylar notch. Fracture fragments appear aligned, however. Medial and lateral compartment joint spaces appear aligned. Patella, visualized tibia, and visualized fibula are intact. Several small intra-articular loose bodies are present  measuring up to 9 mm. Superimposed mild to moderate tricompartmental degenerative arthritis of the knee with joint space narrowing and osteophyte formation, most severe within the medial compartment. Ligaments Suboptimally assessed by CT. Muscles and Tendons Unremarkable Soft tissues Large lipohemarthrosis is present. IMPRESSION: Markedly comminuted fracture of the distal right femur demonstrating both transverse and longitudinal dominant fracture planes with 1 shaft with posterior displacement, override, and posterior angulation of the distal fracture fragment consisting of the femoral condyles. Electronically Signed   By: Fidela Salisbury M.D.   On: 04/08/2021 22:04   DG Knee Complete 4 Views Left  Result Date: 04/08/2021 CLINICAL DATA:  Fall. EXAM: LEFT FEMUR 2 VIEWS; LEFT KNEE - COMPLETE 4+ VIEW COMPARISON:  None. FINDINGS: There is a comminuted and displaced fracture of the distal femoral metaphysis. There is posterior displacement of the distal fracture fragment in relation to the femoral shaft and approximately 3.5 cm overlap. There is impaction of the distal end of the proximal fracture fragment on the anterior aspect of the distal fracture fragment and on the patella. The bones are osteopenic. No dislocation. There is moderate arthritic changes of the left knee with tricompartmental narrowing. The soft tissue swelling of the knee. No radiopaque foreign object or soft tissue gas. IMPRESSION: 1. Comminuted and displaced fracture of the distal femoral metaphysis. No dislocation. 2. Osteopenia. 3. Moderate arthritic changes of the  left knee. Electronically Signed   By: Anner Crete M.D.   On: 04/08/2021 20:10   DG Femur Min 2 Views Left  Result Date: 04/08/2021 CLINICAL DATA:  Fall. EXAM: LEFT FEMUR 2 VIEWS; LEFT KNEE - COMPLETE 4+ VIEW COMPARISON:  None. FINDINGS: There is a comminuted and displaced fracture of the distal femoral metaphysis. There is posterior displacement of the distal fracture  fragment in relation to the femoral shaft and approximately 3.5 cm overlap. There is impaction of the distal end of the proximal fracture fragment on the anterior aspect of the distal fracture fragment and on the patella. The bones are osteopenic. No dislocation. There is moderate arthritic changes of the left knee with tricompartmental narrowing. The soft tissue swelling of the knee. No radiopaque foreign object or soft tissue gas. IMPRESSION: 1. Comminuted and displaced fracture of the distal femoral metaphysis. No dislocation. 2. Osteopenia. 3. Moderate arthritic changes of the left knee. Electronically Signed   By: Anner Crete M.D.   On: 04/08/2021 20:10    Intake/Output    None      ROS No recent fever, bleeding abnormalities, urologic dysfunction, GI problems, or weight gain. Blood pressure (!) 208/101, pulse 87, temperature 98.3 F (36.8 C), resp. rate 18, height 5\' 2"  (1.575 m), weight 77.6 kg, SpO2 99 %. Physical Exam NCAT RRR  No wheezing or lung retractions LLE Foam splint in place, large knee hemarthrosis  Edema/ swelling controlled  Sens: DPN, SPN, TN intact  Motor: EHL, FHL, and lessor toe ext and flex all intact grossly  Brisk cap refill, warm to touch, DP 2+, PT palp  Assessment/Plan:  Severely comminuted distal femur fracture with intra-articular extension and shortening Osteoporosis H/o stroke H/o malignancy with elevated risk of thromboembolic complications  I discussed with the patient the risks and benefits of surgery, including the possibility of infection, nerve injury, vessel injury, wound breakdown, arthritis, symptomatic hardware, DVT/ PE, loss of motion, malunion, nonunion, and need for further surgery among others.  We also specifically discussed arthritis and potential for total knee arthroplasty. She acknowledged these risks and wished to proceed.   Weightbearing: NWB LLE Insicional and dressing care: OK to remove dressings after 48 hrs and leave open  to air with dry gauze PRN Orthopedic device(s): walker Showering: yes VTE prophylaxis: Lovenox 40mg  qd  2 wks with possible transition to Eliquis Pain control: Hydrocodone Follow - up plan: 2 weeks Contact information:  Altamese Union MD, Ainsley Spinner PA   Altamese [place], MD [facility], [facility] [phone]  04/10/2021, 9:45 AM  [facility] [place] [address] [phone] Jenetta [phone] (F)    After 5pm and on the weekends please log on to Amion, go to orthopaedics and the look under the [facility] Call for the provider(s) on call. You can also call our office at [phone] and then follow the prompts to be connected to the call team.

## 2021-04-10 NOTE — Anesthesia Postprocedure Evaluation (Signed)
Anesthesia Post Note ? ?Patient: Tammy [name] ? ?Procedure(s) Performed: OPEN REDUCTION INTERNAL FIXATION (ORIF) DISTAL FEMUR FRACTURE (Left: Leg Upper) ? ?  ? ?Patient location during evaluation: PACU ?Anesthesia Type: General ?Level of consciousness: awake and alert ?Pain management: pain level controlled ?Vital Signs Assessment: post-procedure vital signs reviewed and stable ?Respiratory status: spontaneous breathing, nonlabored ventilation, respiratory function stable and patient connected to nasal cannula oxygen ?Cardiovascular status: blood pressure returned to baseline and stable ?Postop Assessment: no apparent nausea or vomiting ?Anesthetic complications: no ? ? ?No notable events documented. ? ?Last Vitals:  ?Vitals:  ? 04/10/21 1415 04/10/21 1444  ?BP: (!) 151/78 (!) 160/81  ?Pulse: 60 62  ?Resp: 13 15  ?Temp: 37.2 ?C 36.9 ?C  ?SpO2: 98% 95%  ?  ?Last Pain:  ?Vitals:  ? 04/10/21 1415  ?TempSrc:   ?PainSc: Asleep  ? ? ?  ?  ?  ?  ?  ?  ? ?Barnet [place] ? ? ? ? ?

## 2021-04-10 NOTE — Anesthesia Procedure Notes (Signed)
Procedure Name: Intubation ?Date/Time: 04/10/2021 10:34 AM ?Performed by: Annamary Carolin, CRNA ?Pre-anesthesia Checklist: Patient identified, Emergency Drugs available, Suction available and Patient being monitored ?Patient Re-evaluated:Patient Re-evaluated prior to induction ?Oxygen Delivery Method: Circle System Utilized ?Preoxygenation: Pre-oxygenation with 100% oxygen ?Induction Type: IV induction ?Ventilation: Mask ventilation without difficulty ?Laryngoscope Size: Mac and 3 ?Grade View: Grade I ?Tube type: Oral ?Number of attempts: 1 ?Airway Equipment and Method: Stylet and Oral airway ?Placement Confirmation: ETT inserted through vocal cords under direct vision, positive ETCO2 and breath sounds checked- equal and bilateral ?Secured at: 22 cm ?Tube secured with: Tape ?Dental Injury: Teeth and Oropharynx as per pre-operative assessment  ?Comments: EMT student x1, CRNA x1. ? ? ? ? ?

## 2021-04-10 NOTE — Progress Notes (Signed)
Orthopedic Tech Progress Note ?Patient Details:  ?Tammy [name] ?[birth year] ?[id number] ? ?  ? Called in order to hanger for brace. ?  ? ?Patient ID: Tammy [name], female   DOB: 05-17-1955, 66 y.o.   MRN: [id number] ? ?Tammy [name] Tammy [name] ?04/10/2021, 4:12 PM ? ?

## 2021-04-10 NOTE — ED Notes (Signed)
Report given to short stay RN at [facility]. Pt transferred via carelink. ?

## 2021-04-10 NOTE — Progress Notes (Signed)
?PROGRESS NOTE ? ? ? ?Tammy [name]  EHM:[id number] DOB: 20-Sep-1955 DOA: 04/08/2021 ?PCP: [name], MD  ? ? ? ?Brief Narrative:  ? ? ?Tammy [name] is a 66 y.o. 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  ? ?Subjective: ? ?She is npo, awaiting transport to [place] for surgery at 9:30 this am ?She has been getting prn analgesics for pain control and prn hydralazine for elevated  blood pressure, no acute event overnight ? ?No new complaints  ?Assessment & Plan: ? Principal Problem: ?  Femur fracture, left ([place]) ?Active Problems: ?  HTN (hypertension) ?  Malignant neoplasm of upper-outer quadrant of left breast in female, estrogen receptor positive ([place]) ?  Anemia ?  History of CVA (cerebrovascular accident) ? ? ? ?Assessment and Plan: ?No notes have been filed under this hospital service. ?Service: Hospitalist ? ? ?Left comminuated and displaced fracture of distal femur metaphysis ?-Reported from mechanical fall ?--PRN Norco for moderate pain and IV morphine for severe pan  ?-to [facility] for surgery this am, will follow Ortho recommendation ? ?Hyponatremia ?Likely due to dehydration, hold hctz ?Hopefully sodium will improve once diet resumed after surgery ?Repeat bmp in am ?  ?Anemia, normocytic ?Hgb of 10.9. Has been downward trending since at least 9 months ago. Denies melena.  ?- iron panel, vitamin Z66 and folic unremarkable ?-Likely anemia of chronic disease ?  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  ?Compensated cirrhosis secondary to Hep C ?Follows with [facility]  ?  ?HTN ?-Continue amlodipine, atenolol, ?-Hold HCTZ ?-prn hydralazine  ?  ?Hx of CVA , report residual left sided  weakness ?-Pt reports she has not been on aspirin since "no one prescribed it to her." Needs to resume after femur repair.  ?Report follows with neurology Dr. Jaynee Eagles ?  ?Obesity: Body mass index is 31.28 kg/m?[name]. ? ? ? ?I have Reviewed nursing notes, Vitals, pain scores, I/o's, Lab results and  imaging results since pt's last encounter, details please see discussion above  ?I ordered the following labs:  ?Unresulted Labs (From admission, onward)  ? ?  Start     Ordered  ? 04/11/21 0500  CBC  Tomorrow morning,   R       ? 04/10/21 0735  ? 04/11/21 2947  Basic metabolic panel  Tomorrow morning,   R       ? 04/10/21 0735  ? ?  ?  ? ?  ? ? ? ?DVT prophylaxis: enoxaparin (LOVENOX) injection 40 mg Start: 04/09/21 1600 ?SCDs Start: 04/08/21 2318 ? ? ?Code Status:   Code Status: Full Code ? ?Family Communication: Patient  ?Disposition:  ? ?Status is: Inpatient ? ? ?Dispo: The patient is from: Home ?             Anticipated d/c is to: TBD ?             Anticipated d/c date is: TBD, patient is to transfer to [facility] for surgery, expect to stay at [facility] after surgery ? ?Antimicrobials:   ? ?Anti-infectives (From admission, onward)  ? ? None  ? ?  ? ? ? ? ? ?Objective: ?Vitals:  ? 04/10/21 0600 04/10/21 0630 04/10/21 0700 04/10/21 0715  ?BP: (!) 170/83 (!) 167/88 [name])  169/84 (!) 177/82  ?Pulse: 77 88 83 86  ?Resp: 17 19 16  (!) 22  ?Temp:      ?TempSrc:      ?SpO2: 95% 97% 95% 97%  ?Weight:      ?Height:      ? ?No intake or output data in the 24 hours ending 04/10/21 0735 ?Filed Weights  ? 04/08/21 1847  ?Weight: 77.6 kg  ? ? ?Examination: ? ?General exam: alert, awake, communicative,calm, NAD ?Respiratory system: Clear to auscultation. Respiratory effort normal. ?Cardiovascular system:  RRR.  ?Gastrointestinal system: Abdomen is nondistended, soft and nontender.  Normal bowel sounds heard. ?Central nervous system: Alert and oriented. No focal neurological deficits. ?Extremities: Tender to palpation over left knee with  associated edema/effusion ?Skin: No rashes, lesions or ulcers ?Psychiatry: Judgement and insight appear normal. Mood & affect appropriate.  ? ? ? ?Data Reviewed: I have personally reviewed  labs and visualized  imaging studies since the last encounter and formulate the plan  ? ? ? ? ? ? ?Scheduled Meds: ? amLODipine  10 mg Oral Daily  ? anastrozole  1 mg Oral Daily  ? atenolol  25 mg Oral Daily  ? enoxaparin (LOVENOX) injection  40 mg Subcutaneous Q1400  ? gabapentin  100 mg Oral BID  ? hydrALAZINE  25 mg Oral BID  ? pantoprazole  40 mg Oral Daily  ? ?Continuous Infusions: ? ? LOS: 2 days  ? ? ? ?Florencia Reasons, MD PhD FACP ?[facility] ? ?Available via Epic secure chat 7am-7pm for nonurgent issues ?Please page for urgent issues ?To page the attending provider between 7A-7P or the covering provider during after hours 7P-7A, please log into the web site [email or web address] and access using universal [place] password for that web site. If you do not have the password, please call the hospital operator. ? ? ? ?04/10/2021, 7:35 AM  ? ? ?

## 2021-04-10 NOTE — Op Note (Signed)
04/10/2021 ? ?1:22 PM ? ?PATIENT:  Tammy [name]  1955/09/27 female  ? ?MEDICAL RECORD NUMBER: [id number] ? ?PRE-OPERATIVE DIAGNOSIS:  LEFT DISTAL FEMUR FRACTURE WITH INTERCONDYLAR EXTENSION ? ?POST-OPERATIVE DIAGNOSIS:  LEFT DISTAL FEMUR FRACTURE WITH INTERCONDYLAR EXTENSION ? ?PROCEDURE:   ?OPEN REDUCTION INTERNAL FIXATION OF LEFT DISTAL FEMUR WITH INTERCONDYLAR EXTENSION ?MANUAL APPLICATION OF STRESS UNDER FLUOROSCOPY LEFT KNEE  ? ?SURGEON:  Astrid Divine. Marcelino Scot, M.D. ? ?ASSISTANT:  Ainsley Spinner, PA-C. ? ?ANESTHESIA:  General. ? ?COMPLICATIONS:  None. ? ?TOURNIQUET: None. ? ?ESTIMATED BLOOD LOSS:  100 mL. ? ?DISPOSITION:  To PACU. ? ?CONDITION:  Stable. ? ?DELAY START OF DVT PROPHYLAXIS BECAUSE OF BLEEDING RISK: NO ? ?BRIEF SUMMARY OF INDICATION FOR PROCEDURE:  Tammy [name] is a pleasant 66 y.o. who sustained knee injury in ground level fall. The patient presents for definitive repair of this comminuted supracondylar femur fracture with intercondylar extension.  I did discuss with both patient and family the risks and benefits of surgery including the possibility of infection, ?nerve injury, vessel injury, DVT/ PE, loss of motion, arthritis, symptomatic hardware, malunion, nonunion, and need for further surgery among others.  After full discussion, consent was given to proceed. ? ?BRIEF SUMMARY OF PROCEDURE:  The patient was taken to the operating room where general anesthesia was induced.  The left lower extremity was ?prepped and draped in usual sterile fashion with chlorhexidene wash then betadine scrub and paint.  No tourniquet was used during the procedure. Time out was held. With the leg on a radiolucent triangle the C-arm was brought in to mark the starting point on AP and lateral images distally, and the distal incision made.  Dissection was carried carefully down to the retinaculum, which was incised and divided to accommodate plate placement.  Using bumps and traction we were able to secure  an excellent reduction on the AP and lateral views. Appropriate length Biomet NCB plate was then inserted and positioned with the proximal portion on the jig centered on the shaft. ? ?Once we were satisfied with position on ?AP and lateral images, a pin was placed distally and then a drill bit in a proximal shaft hole. The large [facility] clamp compressed the intercondylar fracture site of the articular block, which was then secured with screws placed laterally through the plate. We were careful to watch for rotation and translation. We also brought the knee into full extension to checked rotation and alignment. We secured four bicortical screws proximally and multiple screws into the supracondylar region distally with the addition of locking caps.  Wounds were irrigated thoroughly.  Final C-arm images confirmed appropriate reduction, hardware placement, trajectory and ?length. ? ?I then evaluated the knee joint by applying manual stress under live fluoroscopy, which showed no ligamentous instability to varus and valgus. It did show recurvatum with hyperextension despite the fracture being appropriately reduced and well fixed. This was found to be symmetric with recurvatum on the contralateral side.  All wounds were irrigated thoroughly and then closed in standard layered fashion using #1 Vicryl, #0 Vicryl, 2-0 Vicryl, and 2-0 nylon.  A gently compressive dressing was applied from foot to thigh and then knee immobilizer. The patient was taken to PACU in stable condition.  Ainsley Spinner, PA-C, assisted me throughout and required to effectively produce, control, and maintain the reduction during provisional and definitive internal fixation. He also assisted with wound closure. ? ?PROGNOSIS:  The magnitude of injury and joint involvement significantly increase the risks of loss of motion and  arthritis. Patient will be nonweightbearing on the operative extremity with early mobilization encouraged. Pharmacologic DVT  prophylaxis will be with Lovenox. Hinged brace will be used as an adjunct to assist with mobilization so long as it does not hinder motion. If the plate is prominent or symptomatic late removal could be considered after six to twelve months with adequate healing. Total knee arthroplasty would not be unanticipated. She is at high risk for thromboembolic complications and we will start her on Eliquis with anticipated course of 6 weeks. ? ? ? ?Astrid Divine. Marcelino Scot, M.D.  ?

## 2021-04-11 ENCOUNTER — Encounter (HOSPITAL_COMMUNITY): Payer: Self-pay | Admitting: Orthopedic Surgery

## 2021-04-11 ENCOUNTER — Other Ambulatory Visit (HOSPITAL_COMMUNITY): Payer: Self-pay

## 2021-04-11 DIAGNOSIS — E871 Hypo-osmolality and hyponatremia: Secondary | ICD-10-CM | POA: Insufficient documentation

## 2021-04-11 DIAGNOSIS — S72352A Displaced comminuted fracture of shaft of left femur, initial encounter for closed fracture: Secondary | ICD-10-CM

## 2021-04-11 DIAGNOSIS — D62 Acute posthemorrhagic anemia: Secondary | ICD-10-CM

## 2021-04-11 DIAGNOSIS — E66811 Obesity, class 1: Secondary | ICD-10-CM

## 2021-04-11 DIAGNOSIS — E669 Obesity, unspecified: Secondary | ICD-10-CM

## 2021-04-11 LAB — VITAMIN D 25 HYDROXY (VIT D DEFICIENCY, FRACTURES): Vit D, 25-Hydroxy: 10.88 ng/mL — ABNORMAL LOW (ref 30–100)

## 2021-04-11 LAB — BASIC METABOLIC PANEL
Anion gap: 12 (ref 5–15)
BUN: 24 mg/dL — ABNORMAL HIGH (ref 8–23)
CO2: 23 mmol/L (ref 22–32)
Calcium: 9 mg/dL (ref 8.9–10.3)
Chloride: 103 mmol/L (ref 98–111)
Creatinine, Ser: 0.88 mg/dL (ref 0.44–1.00)
GFR, Estimated: >60 mL/min (ref >60)
Glucose, Bld: 117 mg/dL — ABNORMAL HIGH (ref 70–99)
Potassium: 4.3 mmol/L (ref 3.5–5.1)
Sodium: 138 mmol/L (ref 135–145)

## 2021-04-11 LAB — CBC
HCT: 28.1 % — ABNORMAL LOW (ref 36.0–46.0)
Hemoglobin: 9.1 g/dL — ABNORMAL LOW (ref 12.0–15.0)
MCH: 28.4 pg (ref 26.0–34.0)
MCHC: 32.4 g/dL (ref 30.0–36.0)
MCV: 87.8 fL (ref 80.0–100.0)
Platelets: 182 10*3/uL (ref 150–400)
RBC: 3.2 MIL/uL — ABNORMAL LOW (ref 3.87–5.11)
RDW: 14.2 % (ref 11.5–15.5)
WBC: 11.5 10*3/uL — ABNORMAL HIGH (ref 4.0–10.5)
nRBC: 0 % (ref 0.0–0.2)

## 2021-04-11 MED ORDER — METOCLOPRAMIDE HCL 5 MG/ML IJ SOLN: 5.0000 mg | Freq: Three times a day (TID) | INTRAMUSCULAR | Status: DC | PRN

## 2021-04-11 MED ORDER — APIXABAN 2.5 MG PO TABS: 2.5000 mg | ORAL_TABLET | Freq: Two times a day (BID) | ORAL | Status: DC

## 2021-04-11 MED ORDER — APIXABAN 2.5 MG PO TABS
2.5000 mg | ORAL_TABLET | Freq: Two times a day (BID) | ORAL | Status: DC
  Administered 2021-04-11 – 2021-04-16 (×11): 2.5 mg via ORAL
  Filled 2021-04-11 (×11): qty 1

## 2021-04-11 MED ORDER — ONDANSETRON HCL 4 MG/2ML IJ SOLN
4.0000 mg | Freq: Four times a day (QID) | INTRAMUSCULAR | Status: DC | PRN
Start: 2021-04-11 — End: 2021-04-16

## 2021-04-11 MED ORDER — ZINC SULFATE 220 (50 ZN) MG PO CAPS
220.0000 mg | ORAL_CAPSULE | Freq: Every day | ORAL | Status: DC
  Administered 2021-04-11 – 2021-04-16 (×6): 220 mg via ORAL
  Filled 2021-04-11 (×6): qty 1

## 2021-04-11 MED ORDER — CEFAZOLIN SODIUM-DEXTROSE 1-4 GM/50ML-% IV SOLN
1.0000 g | Freq: Four times a day (QID) | INTRAVENOUS | Status: AC
  Administered 2021-04-11 – 2021-04-12 (×3): 1 g via INTRAVENOUS
  Filled 2021-04-11 (×5): qty 50

## 2021-04-11 MED ORDER — CALCIUM CITRATE 950 (200 CA) MG PO TABS
200.0000 mg | ORAL_TABLET | Freq: Two times a day (BID) | ORAL | Status: DC
Start: 2021-04-11 — End: 2021-04-11

## 2021-04-11 MED ORDER — ASCORBIC ACID 500 MG PO TABS
500.0000 mg | ORAL_TABLET | Freq: Every day | ORAL | Status: DC
  Administered 2021-04-11 – 2021-04-16 (×6): 500 mg via ORAL
  Filled 2021-04-11 (×6): qty 1

## 2021-04-11 MED ORDER — DOCUSATE SODIUM 100 MG PO CAPS
100.0000 mg | ORAL_CAPSULE | Freq: Two times a day (BID) | ORAL | Status: DC
  Administered 2021-04-11 – 2021-04-16 (×10): 100 mg via ORAL
  Filled 2021-04-11 (×10): qty 1

## 2021-04-11 MED ORDER — ACETAMINOPHEN 500 MG PO TABS
500.0000 mg | ORAL_TABLET | Freq: Three times a day (TID) | ORAL | Status: DC
  Administered 2021-04-11 – 2021-04-16 (×14): 500 mg via ORAL
  Filled 2021-04-11 (×14): qty 1

## 2021-04-11 MED ORDER — METOCLOPRAMIDE HCL 5 MG PO TABS: 5.0000 mg | ORAL_TABLET | Freq: Three times a day (TID) | ORAL | Status: DC | PRN

## 2021-04-11 MED ORDER — VITAMIN D 25 MCG (1000 UNIT) PO TABS
2000.0000 [IU] | ORAL_TABLET | Freq: Two times a day (BID) | ORAL | Status: DC
  Administered 2021-04-11 – 2021-04-16 (×11): 2000 [IU] via ORAL
  Filled 2021-04-11 (×11): qty 2

## 2021-04-11 MED ORDER — ONDANSETRON HCL 4 MG PO TABS: 4.0000 mg | ORAL_TABLET | Freq: Four times a day (QID) | ORAL | Status: DC | PRN

## 2021-04-11 NOTE — Evaluation (Signed)
Physical Therapy Evaluation ?Patient Details ?Name: Tammy [name] ?MRN: [id number] ?DOB: 1955-03-23 ?Today's Date: 04/11/2021 ? ?History of Present Illness ? Pt is a 66 y.o. F who presents after a fall with left comminuted and displaced fracture of distal femur metaphysis s/p ORIF 04/10/2021. 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  ?Clinical Impression ? PTA, pt lives with her fiance in a mobile home with 5 steps to enter and is independent. Pt presents with decreased functional mobility secondary to LLE weakness, decreased ROM, impaired balance, and pain. Pt very pleasant and motivated to participate in physical therapy evaluation. Able to participate in bed level open chain exercises (see below) for left lower extremity ROM and strengthening. Pt requiring min assist for bed mobility and stand pivot transfers out of bed. Suspect excellent progress based on age, PLOF, and motivation. Recommend AIR to address deficits, maximize functional independence and decrease caregiver burden.  ?   ? ?Recommendations for follow up therapy are one component of a multi-disciplinary discharge planning process, led by the attending physician.  Recommendations may be updated based on patient status, additional functional criteria and insurance authorization. ? ?Follow Up Recommendations Acute inpatient rehab (3hours/day) ? ?  ?Assistance Recommended at Discharge PRN  ?Patient can return home with the following ? A little help with walking and/or transfers;A little help with bathing/dressing/bathroom;Help with stairs or ramp for entrance ? ?  ?Equipment Recommendations Rolling walker (2 wheels);BSC/3in1;Wheelchair (measurements PT);Wheelchair cushion (measurements PT)  ?Recommendations for Other Services ? Rehab consult  ?  ?Functional Status Assessment Patient has had a recent decline in their functional status and demonstrates the ability  to make significant improvements in function in a reasonable and predictable amount of time.  ? ?  ?Precautions / Restrictions Precautions ?Precautions: Fall ?Required Braces or Orthoses: Other Brace ?Other Brace: L hinged knee brace, unlocked ?Restrictions ?Weight Bearing Restrictions: Yes ?LLE Weight Bearing: Non weight bearing ?Other Position/Activity Restrictions: No LLE ROM restrictions  ? ?  ? ?Mobility ? Bed Mobility ?Overal bed mobility: Needs Assistance ?Bed Mobility: Supine to Sit ?  ?  ?Supine to sit: Min assist ?  ?  ?General bed mobility comments: Increased time/effort, heavy use of rails, assist for LLE off edge of bed. cues for scooting forward ?  ? ?Transfers ?Overall transfer level: Needs assistance ?Equipment used: Rolling walker (2 wheels) ?Transfers: Sit to/from Stand, Bed to chair/wheelchair/BSC ?Sit to Stand: Min assist ?Stand pivot transfers: Min assist ?  ?  ?  ?  ?General transfer comment: Cues for placing LLE anteriorly and for hand placement, minA to rise and initially steady, pt shimmying right foot and pivoting towards left to chair.Dynamic instability but good adherence to WB precautions ?  ? ?Ambulation/Gait ?  ?  ?  ?  ?  ?  ?  ?  ? ?Stairs ?  ?  ?  ?  ?  ? ?Wheelchair Mobility ?  ? ?Modified Rankin (Stroke Patients Only) ?  ? ?  ? ?Balance Overall balance assessment: Needs assistance ?Sitting-balance support: Feet supported ?Sitting balance-Leahy Scale: Fair ?  ?  ?Standing balance support: Bilateral upper extremity supported ?Standing balance-Leahy Scale: Poor ?Standing balance comment: heavily reliant on arms ?  ?  ?  ?  ?  ?  ?  ?  ?  ?  ?  ?   ? ? ? ?Pertinent Vitals/Pain Pain Assessment ?Pain Assessment: Faces ?Faces Pain Scale: Hurts little more ?  Pain Location: LLE ?Pain Descriptors / Indicators: Grimacing, Operative site guarding ?Pain Intervention(s): Monitored during session, Limited activity within patient's tolerance, Premedicated before session  ? ? ?Home Living  Family/patient expects to be discharged to:: Private residence ?Living Arrangements: Spouse/significant other (fiance) ?Available Help at Discharge: Family ?Type of Home: Mobile home ?Home Access: Stairs to enter ?Entrance Stairs-Rails: Right;Left ?Entrance Stairs-Number of Steps: 5 ?  ?Home Layout: One level ?Home Equipment: Shower seat ?   ?  ?Prior Function Prior Level of Function : Independent/Modified Independent ?  ?  ?  ?  ?  ?  ?Mobility Comments: retired Marine scientist ?  ?  ? ? ?Hand Dominance  ?   ? ?  ?Extremity/Trunk Assessment  ? Upper Extremity Assessment ?Upper Extremity Assessment: Defer to OT evaluation ?  ? ?Lower Extremity Assessment ?Lower Extremity Assessment: LLE deficits/detail ?LLE Deficits / Details: Femur fx s/p ORIF. Grossly weak, with limited heel slide, ankle DF WFL ?  ? ?Cervical / Trunk Assessment ?Cervical / Trunk Assessment: Normal  ?Communication  ? Communication: No difficulties  ?Cognition Arousal/Alertness: Awake/alert ?Behavior During Therapy: [facility] for tasks assessed/performed ?Overall Cognitive Status: Within Functional Limits for tasks assessed ?  ?  ?  ?  ?  ?  ?  ?  ?  ?  ?  ?  ?  ?  ?  ?  ?  ?  ?  ? ?  ?General Comments   ? ?  ?Exercises General Exercises - Lower Extremity ?Ankle Circles/Pumps: Both, 20 reps, Supine ?Quad Sets: 10 reps, Supine, Both ?Heel Slides: AAROM, Left, 5 reps, Supine ?Hip ABduction/ADduction: AAROM, Left, 10 reps, Supine ?Straight Leg Raises: AROM, Left, 5 reps, Supine ?Other Exercises ?Other Exercises: Supine: manual left gastroc stretch x 45 s hold  ? ?Assessment/Plan  ?  ?PT Assessment Patient needs continued PT services  ?PT Problem List Decreased strength;Decreased range of motion;Decreased activity tolerance;Decreased balance;Decreased mobility;Pain ? ?   ?  ?PT Treatment Interventions DME instruction;Gait training;Stair training;Functional mobility training;Therapeutic activities;Therapeutic exercise;Balance training;Patient/family education;Wheelchair  mobility training   ? ?PT Goals (Current goals can be found in the Care Plan section)  ?Acute Rehab PT Goals ?Patient Stated Goal: go to rehab ?PT Goal Formulation: With patient ?Time For Goal Achievement: 04/25/21 ?Potential to Achieve Goals: Good ? ?  ?Frequency Min 5X/week ?  ? ? ?Co-evaluation   ?  ?  ?  ?  ? ? ?  ?AM-PAC PT "6 Clicks" Mobility  ?Outcome Measure Help needed turning from your back to your side while in a flat bed without using bedrails?: A Little ?Help needed moving from lying on your back to sitting on the side of a flat bed without using bedrails?: A Little ?Help needed moving to and from a bed to a chair (including a wheelchair)?: A Little ?Help needed standing up from a chair using your arms (e.g., wheelchair or bedside chair)?: A Little ?Help needed to walk in hospital room?: Total ?Help needed climbing 3-5 steps with a railing? : Total ?6 Click Score: 14 ? ?  ?End of Session Equipment Utilized During Treatment: Gait belt;Other (comment) (hinged knee brace) ?Activity Tolerance: Patient tolerated treatment well ?Patient left: in chair;with call bell/phone within reach;with chair alarm set ?Nurse Communication: Mobility status ?PT Visit Diagnosis: Unsteadiness on feet (R26.81);Difficulty in walking, not elsewhere classified (R26.2);Pain ?Pain - Right/Left: Left ?Pain - part of body: Leg ?  ? ?Time: 7322-0254 ?PT Time Calculation (min) (ACUTE ONLY): 22 min ? ? ?Charges:   PT  Evaluation ?$PT Eval Moderate Complexity: 1 Mod ?  ?  ?   ? ? ?Wyona Almas, PT, DPT ?Acute Rehabilitation Services ?Pager [phone] ?Office [phone] ? ? ?Deno Etienne ?04/11/2021, 12:30 PM ? ?

## 2021-04-11 NOTE — TOC CAGE-AID Note (Signed)
Transition of Care (TOC) - CAGE-AID Screening ? ? ?Patient Details  ?Name: Tammy [name] ?MRN: [id number] ?Date of Birth: [birth year] ? ?Transition of Care (TOC) CM/SW Contact:    ?Anyely Cunning C Tarpley-Carter, LCSWA ?Phone Number: ?04/11/2021, 2:42 PM ? ? ?Clinical Narrative: ?Pt participated in [place].  Pt stated she does use substance.  Pt smokes cigarettes.  Pt was offered resources, due to usage of substance.    ? ?Passenger transport manager, MSW, LCSW-A ?Pronouns:  She/Her/Hers ?[facility] ?Clinical Social Worker ?Direct Number:  [phone] ?[email or web address] ? ?CAGE-AID Screening: ?  ? ?Have You Ever Felt You Ought to Cut Down on Your Drinking or Drug Use?: No ?Have People Annoyed You By Critizing Your Drinking Or Drug Use?: No ?Have You Felt Bad Or Guilty About Your Drinking Or Drug Use?: No ?Have You Ever Had a Drink or Used Drugs First Thing In The Morning to Steady Your Nerves or to Get Rid of a Hangover?: No ?CAGE-AID Score: 0 ? ?Substance Abuse Education Offered: Yes ? ?Substance abuse interventions: Educational Materials ? ? ? ? ? ? ?

## 2021-04-11 NOTE — Care Management Important Message (Signed)
Important Message ? ?Patient Details  ?Name: Tammy [name] ?MRN: [id number] ?Date of Birth: [birth year] ? ? ?Medicare Important Message Given:  Yes ? ? ? ? ?Levada Dy  Sammy Cassar-Martin ?04/11/2021, 2:13 PM ?

## 2021-04-11 NOTE — TOC Benefit Eligibility Note (Signed)
Patient Advocate Encounter ? ?Insurance verification completed.   ? ?The patient is currently admitted and upon discharge could be taking Eliquis 2.5 mg . ? ?The current 30 day co-pay is, $47.00.  ? ?The patient is insured through [employer] Part D  ? ? ? ?Lyndel Safe, CPhT ?Pharmacy Patient Advocate Specialist ?[facility] Patient Advocate Team ?Direct Number: [phone]  Fax: [phone] ? ? ? ? ? ?  ?

## 2021-04-11 NOTE — Progress Notes (Signed)
? ?                              Orthopaedic Trauma Service Progress Note ? ?Patient ID: ?Tammy [name] ?MRN: [id number] ?DOB/AGE: August 10, 1955 66 y.o. ? ?Subjective: ? ?Doing very well this am  ?Pain much improved from pre-op state ?No complaints ? ?Would like to go to SNF short term  ? ?Pt has never really appreciated excess laxity in her knees ?Has arthritis in both knees ?Very active all her life. 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 ? ?ROS ?As above ? ?Objective:  ? ?VITALS:   ?Vitals:  ? 04/10/21 2045 04/10/21 2149 04/11/21 0415 04/11/21 0747  ?BP: 135/80 126/70 (!) 143/72 123/77  ?Pulse: 60  (!) 52   ?Resp: 17  16   ?Temp: 98.3 ?F (36.8 ?C)     ?TempSrc: Oral     ?SpO2: 97%  98%   ?Weight:      ?Height:      ? ? ?Estimated body mass index is 31.28 kg/m? as calculated from the following: ?  Height as of this encounter: 5\' 2"  (1.575 m). ?  Weight as of this encounter: 77.6 kg. ? ? ?Intake/Output   ?   03/02 0701 ?03/03 0700 03/03 0701 ?03/04 0700  ? I.V. (mL/kg) 800 (10.3)   ? Total Intake(mL/kg) 800 (10.3)   ? Urine (mL/kg/hr) 400 (0.2)   ? Stool 1   ? Blood 100   ? Total Output 501   ? Net +299   ?     ?  ? ?LABS ? ?Results for orders placed or performed during the hospital encounter of 04/08/21 (from the past 24 hour(s))  ?CBC     Status: Abnormal  ? Collection Time: 04/11/21  2:19 AM  ?Result Value Ref Range  ? WBC 11.5 (H) 4.0 - 10.5 K/uL  ? RBC 3.20 (L) 3.87 - 5.11 MIL/uL  ? Hemoglobin 9.1 (L) 12.0 - 15.0 g/dL  ? HCT 28.1 (L) 36.0 - 46.0 %  ? MCV 87.8 80.0 - 100.0 fL  ? MCH 28.4 26.0 - 34.0 pg  ? MCHC 32.4 30.0 - 36.0 g/dL  ? RDW 14.2 11.5 - 15.5 %  ? Platelets 182 150 - 400 K/uL  ? nRBC 0.0 0.0 - 0.2 %  ?Basic metabolic panel     Status: Abnormal  ? Collection Time: 04/11/21  2:19 AM  ?Result Value Ref Range  ? Sodium 138 135 - 145 mmol/L  ? Potassium 4.3 3.5 - 5.1 mmol/L  ? Chloride 103 98 - 111 mmol/L  ? CO2 23 22 - 32 mmol/L  ?  Glucose, Bld 117 (H) 70 - 99 mg/dL  ? BUN 24 (H) 8 - 23 mg/dL  ? Creatinine, Ser 0.88 0.44 - 1.00 mg/dL  ? Calcium 9.0 8.9 - 10.3 mg/dL  ? GFR, Estimated >60 >60 mL/min  ? Anion gap 12 5 - 15  ? ? ? ?PHYSICAL EXAM:  ? ?Gen: sitting up in bed, NAD, appears well, very pleasant, appreciative for all the care she has received  ?Lungs: unlabored  ?Cardiac: reg ?Ext:  ?     Left Lower extremity  ? Hinged brace in place and unlocked, fitting well ? TED hose fitting well  ? Mepilex dressings with scant strikethrough  ? Ext warm  ? + DP pulse ? No DCT ? Compartments are soft  ?  No pain out of proportion with passive stretch  ? EHL, FHL, lesser toe motor intact ? Ankle flexion, extension, inversion and eversion intact ? Minimal swelling  ? Alignment nearly symmetric to contra-lateral side  ?  ?Assessment/Plan: ?1 Day Post-Op  ? ? ? ?Anti-infectives (From admission, onward)  ? ? Start     Dose/Rate Route Frequency Ordered Stop  ? 04/11/21 0950  ceFAZolin (ANCEF) IVPB 1 g/50 mL premix       ? 1 g ?100 mL/hr over 30 Minutes Intravenous Every 6 hours 04/11/21 0950 04/12/21 0444  ? 04/10/21 0845  ceFAZolin (ANCEF) IVPB 2g/100 mL premix       ? 2 g ?200 mL/hr over 30 Minutes Intravenous On call to O.R. 04/10/21 0831 04/10/21 1031  ? 04/10/21 0821  ceFAZolin (ANCEF) 2-4 GM/100ML-% IVPB       ?Note to Pharmacy: Debby Bud: cabinet override  ?    04/10/21 8657 04/10/21 1032  ? ?  ?. ? ?POD/HD#: 1 ? ?66 y/o female s/p ground level fall with comminuted L distal femur fracture ? ?-ground level fall ? ?-comminuted L distal femur fracture s/p ORIF  ? NWB L leg x 6-8 weeks---> walker for ambulation  ? Unrestricted ROM L knee in hinge  ?  Brace can be off when in bed  ? PT/OT evals ? Dressing changes on Sunday  ? ? Ice and elevate for swelling/pain control  ? ?PT- please teach HEP for L knee ROM- AROM, PROM.  No ROM restrictions.  Quad sets, SLR, LAQ, SAQ, heel slides, stretching. ?Ankle theraband program, heel cord stretching, toe  towel curls, etc ? ?No pillows under bend of knee when at rest, ok to place under heel to help work on extension. Can also use zero knee bone foam if available ? ?Hinged knee brace on at all times, unlocked.  Ok to work on [employer] without brace with therapist supervision.  Brace back on after ROM session if removed ?  ?- Pain management: ? Multimodal ? Minimize narcotics  ? ?- ABL anemia/Hemodynamics ? Monitor  ? ?- Medical issues  ? Per primary  ? ?- DVT/PE prophylaxis: ? Lovenox today ? Start eliquis tomorrow x 6 weeks ?- ID:  ? Periop abx ? ?- Metabolic Bone Disease: ? Fracture pattern/mechanism suggestive of fragility fracture ? Recommend DEXA in next 4-8 weeks ? Will refer to osteoporosis clinic  ? Vitamin d levels pending  ? ?- Activity: ? As above ? ?- Impediments to fracture healing: ? Poor bone quality/fragility fracture  ? Chronic pain/chronic opioid  use  ? ?- Dispo: ? Therapy evals ? TOC consult for SNF  ? ? ? ?Jari Pigg, PA-C ?[phone] (C) ?04/11/2021, 9:51 AM ? ?[facility] ?[place] [address] ?[phone] Jenetta Downer) ?[phone] (F) ? ? ? ?After 5pm and on the weekends please log on to Amion, go to orthopaedics and the look under the [facility] Call for the provider(s) on call. You can also call our office at [phone] and then follow the prompts to be connected to the call team.  ? Patient ID: Tammy [name], female   DOB: 03-13-1955, 66 y.o.   MRN: [id number] ? ?

## 2021-04-11 NOTE — Progress Notes (Signed)
ANTICOAGULATION CONSULT NOTE - Initial Consult ? ?Pharmacy Consult for Eliquis ?Indication: VTE prophylaxis s/p ORIF Left femur fx ? ?Allergies  ?Allergen Reactions  ? Lisinopril Cough  ? ? ?Patient Measurements: ?Height: 5\' 2"  (157.5 cm) ?Weight: 77.6 kg (171 lb) ?IBW/kg (Calculated) : 50.1 kg ? ? ?Vital Signs: ?BP: 123/77 (03/03 0747) ?Pulse Rate: 52 (03/03 0415) ? ?Labs: ?Recent Labs  ?  04/08/21 ?2136 04/10/21 ?8676 04/11/21 ?1950  ?HGB 10.9* 11.1* 9.1*  ?HCT 33.9* 35.5* 28.1*  ?PLT 208 199 182  ?CREATININE 0.74 0.71 0.88  ? ? ?Estimated Creatinine Clearance: 60.7 mL/min (by C-G formula based on SCr of 0.88 mg/dL). ? ? ?Medical History: ?Past Medical History:  ?Diagnosis Date  ? Arthritis   ? Bipolar disorder ([place])   ? Breast cancer ([place])   ? Depression   ? Family history of breast cancer 07/10/2020  ? Family history of colon cancer 07/10/2020  ? Family history of ovarian cancer 07/10/2020  ? GERD (gastroesophageal reflux disease)   ? HTN (hypertension)   ? Stroke [facility]) 09/2012  ? slurred speech and left sided weakness/notes 10/04/2012  ? ? ?Medications:  ?Medications Prior to Admission  ?Medication Sig Dispense Refill Last Dose  ? acetaminophen (TYLENOL) 500 MG tablet Take 1,000 mg by mouth every 6 (six) hours as needed for mild pain.   04/08/2021  ? amLODipine (NORVASC) 10 MG tablet Take 10 mg by mouth daily.   04/08/2021  ? anastrozole (ARIMIDEX) 1 MG tablet Take 1 tablet (1 mg total) by mouth daily. 90 tablet 4 04/08/2021  ? atenolol (TENORMIN) 25 MG tablet Take 25 mg by mouth daily.   04/08/2021 at 1000  ? gabapentin (NEURONTIN) 100 MG capsule Take 200 mg by mouth at bedtime.   04/08/2021  ? hydrALAZINE (APRESOLINE) 25 MG tablet Take 25 mg by mouth 2 (two) times daily.   04/08/2021  ? hydrochlorothiazide (HYDRODIURIL) 25 MG tablet Take 25 mg by mouth daily.   04/08/2021  ? HYDROcodone-acetaminophen (NORCO/VICODIN) 5-325 MG tablet Take 1 tablet by mouth every 4 (four) hours as needed for pain.   04/08/2021  ?  meloxicam (MOBIC) 15 MG tablet Take 15 mg by mouth daily.   04/08/2021  ? Multiple Vitamin (MULTIVITAMIN PO) Take 1 tablet by mouth daily.   04/08/2021  ? pantoprazole (PROTONIX) 40 MG tablet Take 40 mg by mouth daily.   04/08/2021  ? buPROPion (WELLBUTRIN SR) 150 MG 12 hr tablet Take 150 mg by mouth 2 (two) times daily.     ? gabapentin (NEURONTIN) 300 MG capsule Take 1-2 capsules (300-600 mg total) by mouth 2 (two) times daily. (Patient not taking: Reported on 04/09/2021) 360 capsule 2 Not Taking  ? ? ?Assessment: ?66 y.o female s/p ORIF L femur on 04/10/21.  ?Enoxabarin  40 mg SQ q24h>>changed to  Eliquis 2.5 mg bid for  VTE prophylaxis x 6 weeks post  ORIF surgery.  ?S/p ORIF femur rx LLE on 04/10/21 ?Last enoxaparin dose was given on 04/09/21 @ 16:27 preop, dose held postop day 3/2.  ? ? Hgb 9-11 stable, pltc wnl.  No bleeding reported ? ?Goal of Therapy:  ?Prevention of VTE ?Monitor platelets by anticoagulation protocol: Yes ?  ?Plan:  ?Discontinue sq lovenox now (last given 3/1 preop) ?Start Eliquis 2.5 mg bid now , continue x 6 weeks s/p ORIF left femur fx ?AVS  Eliquis education information posted ?Plan to educate patient prior to discharge ?Copay $47.00/30 day ? ?Thank you for allowing pharmacy to be part of  this patients care team. ?Nicole Cella, RPh ?Clinical Pharmacist ?[phone] ? ?04/11/2021,10:06 AM ? ?Please check AMION for all [place] phone numbers ?After 10:00 PM, call [place] [phone] ? ?

## 2021-04-11 NOTE — Progress Notes (Signed)
Orthopedic Tech Progress Note ?Patient Details:  ?Tammy [name] ?1955-02-27 ?[id number] ? ?Ortho Devices ?Type of Ortho Device: Bone foam zero knee ?Ortho Device/Splint Interventions: Ordered ?  ?  ? ?Tammy [name] ?04/11/2021, 1:38 PM ? ?

## 2021-04-11 NOTE — Progress Notes (Signed)
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  She was found to have a left comminuted and displaced fracture of the distal femur metaphysis, orthopedic surgery was consulted and she is status post operative repair on 3/2.  Subjective / 24h Interval events: Doing well this morning, she is happy that she no longer has as much pain as she used to.  Assesement and Plan: Principal Problem:   Left comminuted and displaced fracture of the distal femur metaphysis Active Problems:   HTN (hypertension)   Bipolar disorder, unspecified (HCC)   Malignant neoplasm of upper-outer quadrant of left breast in female, estrogen receptor positive ([place])   Anemia   History of CVA (cerebrovascular accident)   Hyponatremia   Acute postoperative anemia due to expected blood loss  Assessment and Plan: Principal problem Left comminuted and displaced fracture of the distal femoral metaphysis-orthopedic surgery consulted, followed patient while hospitalized.  She was taken to the OR on 3/2 by Dr. Marcelino Scot and she is status post ORIF left distal femur with intercondylar extension.  For now orthopedic surgery recommends nonweightbearing on the operative extremity with early mobilization encouraged.  DVT prophylaxis will be with Eliquis given high risk for thromboembolic complications.  PT to evaluate, patient expressed preference that she would like to be in short-term rehab  Active problems Hyponatremia-mild, resolved.  Due to HCTZ  Anemia, acute blood loss, postoperatively-monitor hemoglobin, no active  bleeding.  There is a component of anemia of chronic disease in the setting of underlying malignancy  Estrogen receptor positive breast cancer, history of lumpectomy and XRT-continue anastrozole  Compensated liver cirrhosis, due to hep C-follows as an outpatient.  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, she would benefit from weight loss  Scheduled Meds:  acetaminophen  500 mg Oral Q8H   amLODipine  10 mg Oral Daily   anastrozole  1 mg Oral Daily   apixaban  2.5 mg Oral BID   vitamin C  500 mg Oral Daily   atenolol  25 mg Oral Daily   cholecalciferol  2,000 Units Oral BID   docusate sodium  100 mg Oral BID   gabapentin  100 mg Oral BID   hydrALAZINE  25 mg Oral BID   mupirocin ointment  1 application Nasal BID   pantoprazole  40 mg Oral Daily   zinc sulfate  220 mg Oral Daily   Continuous Infusions:   ceFAZolin (ANCEF) IV     PRN Meds:.HYDROcodone-acetaminophen, metoCLOPramide **OR** metoCLOPramide (REGLAN) injection, morphine injection, ondansetron **OR** ondansetron (ZOFRAN) IV, senna-docusate  Diet Orders (From admission, onward)     Start     Ordered   04/10/21 1345  Diet regular Room service appropriate? Yes; Fluid consistency: Thin  Diet effective now       Question Answer Comment  Room service appropriate? Yes   Fluid consistency: Thin      04/10/21 1344            DVT prophylaxis: apixaban (ELIQUIS) tablet 2.5 mg Start: 04/11/21 1115 SCDs Start: 04/10/21 1342 SCDs Start: 04/08/21 2318 apixaban (ELIQUIS) tablet 2.5  mg   Lab Results  Component Value Date   PLT 182 04/11/2021      Code Status: Full Code  Family Communication: no family at bedside   Status is: Inpatient  Remains inpatient appropriate because: needs placement  Level of care: Telemetry Medical  Consultants:  Orthopedic surgery  Procedures:  ORIF 3/2  Microbiology  none  Antimicrobials: none     Objective: Vitals:   04/10/21 2149 04/11/21 0415 04/11/21 0747 04/11/21 1105  BP: 126/70 (!) 143/72 123/77   Pulse:  (!) 52  (!) 55  Resp:  16    Temp:      TempSrc:      SpO2:  98%    Weight:      Height:        Intake/Output Summary (Last 24 hours) at 04/11/2021 1124 Last data filed at 04/11/2021 0523 Gross per 24 hour  Intake 800 ml  Output 501 ml  Net 299 ml   Wt Readings from Last 3 Encounters:  04/08/21 77.6 kg  10/21/20 77.8 kg  08/21/20 74.6 kg    Examination:  Constitutional: NAD Eyes: no scleral icterus ENMT: Mucous membranes are moist.  Neck: normal, supple Respiratory: clear to auscultation bilaterally, no wheezing, no crackles. Normal respiratory effort.  Cardiovascular: Regular rate and rhythm, no murmurs / rubs / gallops. No LE edema.  Abdomen: non distended, no tenderness. Bowel sounds positive.  Musculoskeletal: no clubbing / cyanosis.  Skin: no rashes Neurologic: non focal    Data Reviewed: I have independently reviewed following labs and imaging studies  CBC Recent Labs  Lab 04/08/21 2136 04/10/21 0536 04/11/21 0219  WBC 10.6* 9.8 11.5*  HGB 10.9* 11.1* 9.1*  HCT 33.9* 35.5* 28.1*  PLT 208 199 182  MCV 88.3 89.0 87.8  MCH 28.4 27.8 28.4  MCHC 32.2 31.3 32.4  RDW 14.7 14.3 14.2    Recent Labs  Lab 04/08/21 2136 04/10/21 0536 04/11/21 0219  NA 137 133* 138  K 3.6 4.3 4.3  CL 103 100 103  CO2 25 21* 23  GLUCOSE 115* 117* 117*  BUN 14 13 24*  CREATININE 0.74 0.71 0.88  CALCIUM 9.3 9.2 9.0  MG  --  2.4  --     ------------------------------------------------------------------------------------------------------------------ No results for input(s): CHOL, HDL, LDLCALC, TRIG, CHOLHDL, LDLDIRECT in the last 72 hours.  Lab Results  Component Value Date   HGBA1C 5.6 12/01/2018   ------------------------------------------------------------------------------------------------------------------ No results for input(s): TSH,  T4TOTAL, T3FREE, THYROIDAB in the last 72 hours.  Invalid input(s): FREET3  Cardiac Enzymes No results for input(s): CKMB, TROPONINI, MYOGLOBIN in the last 168 hours.  Invalid input(s): CK ------------------------------------------------------------------------------------------------------------------ No results found for: BNP  CBG: No results for input(s): GLUCAP in the last 168 hours.  Recent Results (from the past 240 hour(s))  Resp Panel by RT-PCR (Flu A&B, Covid) Nasopharyngeal Swab     Status: None   Collection Time: 04/08/21  9:37 PM   Specimen: Nasopharyngeal Swab; Nasopharyngeal(NP) swabs in vial transport medium  Result Value Ref Range Status   SARS Coronavirus 2 by RT PCR NEGATIVE NEGATIVE Final    Comment: (NOTE) SARS-CoV-2 target nucleic acids are NOT DETECTED.  The SARS-CoV-2 RNA is generally detectable in upper respiratory specimens during the acute phase of infection. The lowest concentration of SARS-CoV-2 viral copies this assay can detect is 138 copies/mL. A negative result does not preclude SARS-Cov-2 infection and should not be used as the sole basis for treatment or other patient management decisions. A negative  result may occur with  improper specimen collection/handling, submission of specimen other than nasopharyngeal swab, presence of viral mutation(s) within the areas targeted by this assay, and inadequate number of viral copies(<138 copies/mL). A negative result must be combined with clinical observations, patient history, and epidemiological information. The expected result is Negative.  Fact Sheet for Patients:  EntrepreneurPulse.com.au  Fact Sheet for Healthcare Providers:  IncredibleEmployment.be  This test is no t yet approved or cleared by the [place] FDA and  has been authorized for detection and/or diagnosis of SARS-CoV-2 by FDA under an Emergency Use Authorization (EUA). This EUA will remain  in  effect (meaning this test can be used) for the duration of the COVID-19 declaration under Section 564(b)(1) of the Act, 21 U.S.C.section 360bbb-3(b)(1), unless the authorization is terminated  or revoked sooner.       Influenza A by PCR NEGATIVE NEGATIVE Final   Influenza B by PCR NEGATIVE NEGATIVE Final    Comment: (NOTE) The Xpert Xpress SARS-CoV-2/FLU/RSV plus assay is intended as an aid in the diagnosis of influenza from Nasopharyngeal swab specimens and should not be used as a sole basis for treatment. Nasal washings and aspirates are unacceptable for Xpert Xpress SARS-CoV-2/FLU/RSV testing.  Fact Sheet for Patients: EntrepreneurPulse.com.au  Fact Sheet for Healthcare Providers: IncredibleEmployment.be  This test is not yet approved or cleared by the [place] FDA and has been authorized for detection and/or diagnosis of SARS-CoV-2 by FDA under an Emergency Use Authorization (EUA). This EUA will remain in effect (meaning this test can be used) for the duration of the COVID-19 declaration under Section 564(b)(1) of the Act, 21 U.S.C. section 360bbb-3(b)(1), unless the authorization is terminated or revoked.  Performed at [facility], [place] [address], [place], [place] [address]   Surgical PCR screen     Status: None   Collection Time: 04/10/21  8:07 AM   Specimen: Nasal Mucosa; Nasal Swab  Result Value Ref Range Status   MRSA, PCR NEGATIVE NEGATIVE Final   Staphylococcus aureus NEGATIVE NEGATIVE Final    Comment: (NOTE) The Xpert SA Assay (FDA approved for NASAL specimens in patients 53 years of age and older), is one component of a comprehensive surveillance program. It is not intended to diagnose infection nor to guide or monitor treatment. Performed at [facility], [place] [address], [place],  [address]      Radiology Studies: DG Knee 1-2 Views Right  Result Date: 04/10/2021 CLINICAL DATA:   Elective surgery EXAM: RIGHT KNEE - 1-2 VIEW COMPARISON:  None FINDINGS: Single AP view of the RIGHT knee was obtained intraoperatively. Osseous demineralization with mild degenerative changes. No fracture dislocation. IMPRESSION: Osseous demineralization RIGHT knee with mild scattered degenerative changes. Electronically Signed   By: Lavonia Dana M.D.   On: 04/10/2021 13:18   DG C-Arm 1-60 Min-No Report  Result Date: 04/10/2021 Fluoroscopy was utilized by the requesting physician.  No radiographic interpretation.   DG C-Arm 1-60 Min-No Report  Result Date: 04/10/2021 Fluoroscopy was utilized by the requesting physician.  No radiographic interpretation.   DG FEMUR MIN 2 VIEWS LEFT  Result Date: 04/10/2021 CLINICAL DATA:  ORIF distal LEFT femur fracture EXAM: LEFT FEMUR 2 VIEWS COMPARISON:  04/08/2021 FLUOROSCOPY TIME: 1 minute 26.4 seconds Dose: 6.82 mGy Images: 9 FINDINGS: Osseous demineralization. Comminuted distal LEFT femoral metadiaphyseal fracture again seen. Images demonstrate sequential placement of a lateral plate and multiple screws across the distal femoral fracture. Knee joint alignment normal. Patellofemoral degenerative changes. IMPRESSION: Post  ORIF of comminuted distal LEFT femoral metadiaphyseal fracture. Electronically Signed   By: Lavonia Dana M.D.   On: 04/10/2021 13:17   DG FEMUR PORT MIN 2 VIEWS LEFT  Result Date: 04/10/2021 CLINICAL DATA:  Status post LEFT femur ORIF EXAM: LEFT FEMUR PORTABLE 2 VIEWS COMPARISON:  April 08, 2021 FINDINGS: Osteopenia. Status post ORIF of the distal femur. Orthopedic hardware is intact and without new periprosthetic fracture or lucency. Revisualization of a comminuted fracture of the distal femur. There is improved alignment of the fracture fragments with decreased foreshortening. Soft tissue air consistent with recent surgical intervention. Air within the knee joint space consistent with surgical intervention. Moderate joint effusion. IMPRESSION:  Expected postsurgical appearance status post ORIF of the LEFT distal femur. Electronically Signed   By: Valentino Saxon M.D.   On: 04/10/2021 14:20     Marzetta Board, MD, PhD [facility]  Between 7 am - 7 pm I am available, please contact me via Amion (for emergencies) or Securechat (non urgent messages)  Between 7 pm - 7 am I am not available, please contact night coverage MD/APP via Amion

## 2021-04-11 NOTE — TOC Initial Note (Signed)
Transition of Care (TOC) - Initial/Assessment Note  ? ? ?Patient Details  ?Name: Tammy [name] ?MRN: [id number] ?Date of Birth: 12-Jan-1956 ? ?Transition of Care [facility]) CM/SW Contact:    ?Sharin Mons, RN ?Phone Number: ?04/11/2021, 2:25 PM ? ?Clinical Narrative:                 ?Admitted after falling. Suffered L femur fx.  ?   - s/p  ORIF OF L DISTAL FEMUR ?NCM spoke with pt regarding d/c planning. Pt states from home with fiance' Tammy [name]. Has supportive daughter Tammy [name]. States PTA independent with ADL's , no DME usage. ? ?Per PT's recommendations: Acute inpatient rehab (3hours/day). Pt states interested , however, if not available interested in [facility]. States sister recently d/c  (3/3)from [facility] to [facility].  ? ?TOC team following and will assist with needs. ? ? ?Expected Discharge Plan: [place] (SNF) ?Barriers to Discharge: Continued Medical Work up ? ? ?Patient Goals and CMS Choice ?  ?  ?  ? ?Expected Discharge Plan and Services ?Expected Discharge Plan: [place] (SNF) ?  ?Discharge Planning Services: CM Consult ?  ?Living arrangements for the past 2 months: Mobile Home ?                ?  ?  ?  ?  ?  ?  ?  ?  ?  ?  ? ?Prior Living Arrangements/Services ?Living arrangements for the past 2 months: Mobile Home ?Lives with:: Significant Other ?Patient language and need for interpreter reviewed:: Yes ?Do you feel safe going back to the place where you live?: Yes      ?Need for Family Participation in Patient Care: Yes (Comment) ?Care giver support system in place?: Yes (comment) ?  ?Criminal Activity/Legal Involvement Pertinent to Current Situation/Hospitalization: No - Comment as needed ? ?Activities of Daily Living ?Home Assistive Devices/Equipment: None ?ADL Screening (condition at time of admission) ?Patient's cognitive ability adequate to safely complete daily activities?: No ?Is the patient deaf or have difficulty hearing?: No ?Does the patient have difficulty  seeing, even when wearing glasses/contacts?: No ?Does the patient have difficulty concentrating, remembering, or making decisions?: No ?Patient able to express need for assistance with ADLs?: Yes ?Does the patient have difficulty dressing or bathing?: No ?Independently performs ADLs?: Yes (appropriate for developmental age) ?Communication: Independent ?Dressing (OT): Needs assistance ?Is this a change from baseline?: Change from baseline, expected to last >3 days ?Grooming: Independent ?Feeding: Independent ?Bathing: Needs assistance ?Is this a change from baseline?: Change from baseline, expected to last >3 days ?Toileting: Needs assistance ?Is this a change from baseline?: Change from baseline, expected to last >3days ?In/Out Bed: Needs assistance ?Is this a change from baseline?: Change from baseline, expected to last >3 days ?Walks in Home: Needs assistance ?Is this a change from baseline?: Change from baseline, expected to last >3 days ?Does the patient have difficulty walking or climbing stairs?: Yes ?Weakness of Legs: Left ?Weakness of Arms/Hands: None ? ?Permission Sought/Granted ?  ?  ?   ?   ?   ?   ? ?Emotional Assessment ?Appearance:: Appears stated age ?Attitude/Demeanor/Rapport: Gracious ?Affect (typically observed): Accepting ?Orientation: : Oriented to Self, Oriented to Place, Oriented to  Time, Oriented to Situation ?Alcohol / Substance Use: Not Applicable ?Psych Involvement: No (comment) ? ?Admission diagnosis:  Femur fracture, left ([place]) [S72.92XA] ?Closed displaced comminuted fracture of shaft of left femur, initial encounter ([place]) [S72.352A] ?Patient Active Problem List  ?  Diagnosis Date Noted  ? Hyponatremia 04/11/2021  ? Acute postoperative anemia due to expected blood loss 04/11/2021  ? Class 1 obesity 04/11/2021  ? Left comminuted and displaced fracture of the distal femur metaphysis 04/08/2021  ? Anemia 04/08/2021  ? History of CVA (cerebrovascular accident) 04/08/2021  ? Genetic testing  07/17/2020  ? Family history of breast cancer 07/10/2020  ? Family history of ovarian cancer 07/10/2020  ? Family history of colon cancer 07/10/2020  ? Malignant neoplasm of upper-outer quadrant of left breast in female, estrogen receptor positive ([place]) 07/09/2020  ? 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 Chronic pain syndrome 10/06/2012  ? Nicotine dependence 10/06/2012  ? Anxiety state, unspecified 10/05/2012  ? Bipolar disorder, unspecified ([place]) 10/05/2012  ? Slurred speech 10/04/2012  ? Left-sided weakness 10/04/2012  ? Hyperkalemia 10/04/2012  ? Acute CVA (cerebrovascular accident) ([place]) 10/04/2012  ? HTN (hypertension)   ? ?PCP:  [name], MD ?Pharmacy:   ?[facility] [place], [place] - [address] AT [address] ?[address] ?[place] [address] ?Phone: [phone] Fax: [phone] ? ?[id number] - [place], [address] ?[address] ?[place] [address] ?Phone: [phone] Fax: [phone] ? ?[facility] ([place], [place] ?[place] ?[address] ?[place] [address] ?Phone: [phone] Fax: [phone] ? ? ? ? ?Social Determinants of Health (SDOH) Interventions ?  ? ?Readmission Risk Interventions ?No flowsheet data found. ? ? ?

## 2021-04-11 NOTE — Discharge Instructions (Addendum)
Information on my medicine - ELIQUIS (apixaban)  This medication education was reviewed with me or my healthcare representative as part of my discharge preparation.    Why was Eliquis prescribed for you? Eliquis was prescribed for you to reduce the risk of blood clots forming after orthopedic surgery.    What do You need to know about Eliquis? Take your Eliquis TWICE DAILY - one tablet in the morning and one tablet in the evening with or without food.  It would be best to take the dose about the same time each day.  If you have difficulty swallowing the tablet whole please discuss with your pharmacist how to take the medication safely.  Take Eliquis exactly as prescribed by your doctor and DO NOT stop taking Eliquis without talking to the doctor who prescribed the medication.  Stopping without other medication to take the place of Eliquis may increase your risk of developing a clot.  After discharge, you should have regular check-up appointments with your healthcare provider that is prescribing your Eliquis.  What do you do if you miss a dose? If a dose of ELIQUIS is not taken at the scheduled time, take it as soon as possible on the same day and twice-daily administration should be resumed.  The dose should not be doubled to make up for a missed dose.  Do not take more than one tablet of ELIQUIS at the same time.  Important Safety Information A possible side effect of Eliquis is bleeding. You should call your healthcare provider right away if you experience any of the following: Bleeding from an injury or your nose that does not stop. Unusual colored urine (red or dark brown) or unusual colored stools (red or black). Unusual bruising for unknown reasons. A serious fall or if you hit your head (even if there is no bleeding).  Some medicines may interact with Eliquis and might increase your risk of bleeding or clotting while on Eliquis. To help avoid this, consult your  healthcare provider or pharmacist prior to using any new prescription or non-prescription medications, including herbals, vitamins, non-steroidal anti-inflammatory drugs (NSAIDs) and supplements.  This website has more information on Eliquis (apixaban): http://www.eliquis.com/eliquis/home      Orthopaedic Trauma Service Discharge Instructions   General Discharge Instructions  Orthopaedic Injuries:  Left distal femur fracture treated with open reduction and internal fixation using plate and screws  WEIGHT BEARING STATUS: Nonweightbearing left leg using walker  RANGE OF MOTION/ACTIVITY: Unrestricted range of motion left knee.  Activity as tolerated while maintaining nonweightbearing restrictions  Bone health: Labs show vitamin D deficiency.  Continue with vitamin D supplementation.  Recommend bone density scan in the next month.  You will be referred to osteoporosis clinic for discussion of treatment options  Review the following resource for additional information regarding bone health  [email or web address]  Wound Care: Daily wound care as needed.  Can leave incisions open to the air or can cover with silicone foam dressing (Mepilex) or 4 x 4 gauze and tape.  Please see below  Discharge Wound Care Instructions  Do NOT apply any ointments, solutions or lotions to pin sites or surgical wounds.  These prevent needed drainage and even though solutions like hydrogen peroxide kill bacteria, they also damage cells lining the pin sites that help fight infection.  Applying lotions or ointments can keep the wounds moist and can cause them to breakdown and open up as well. This can increase the risk for infection. When in doubt call the office.  Surgical incisions should be dressed daily.  If any drainage is noted, use one layer of adaptic or Mepitel, then gauze, Kerlix, and an ace  wrap.  [email or web address]  [email or web address].gl  These dressing supplies should be available at local medical supply stores ([facility], [facility], etc). They are not usually carried at places like [facility], [facility], [facility], etc  Once the incision is completely dry and without drainage, it may be left open to air out.  Showering may begin 36-48 hours later.  Cleaning gently with soap and water.  DVT/PE prophylaxis: eliquis 2.5 mg every 12 hours x 6 weeks   Diet: as you were eating previously.  Can use over the counter stool softeners and bowel preparations, such as Miralax, to help with bowel movements.  Narcotics can be constipating.  Be sure to drink plenty of fluids  PAIN MEDICATION USE AND EXPECTATIONS  You have likely been given narcotic medications to help control your pain.  After a traumatic event that results in an fracture (broken bone) with or without surgery, it is ok to use narcotic pain medications to help control one's pain.  We understand that everyone responds to pain differently and each individual patient will be evaluated on a regular basis for the continued need for narcotic medications. Ideally, narcotic medication use should last no more than 6-8 weeks (coinciding with fracture healing).   As a patient it is your responsibility as well to monitor narcotic medication use and report the amount and frequency you use these medications when you come to your office visit.   We would also advise that if you are using narcotic medications, you should take a dose prior to therapy to maximize you participation.  IF YOU ARE ON NARCOTIC MEDICATIONS IT IS NOT PERMISSIBLE TO OPERATE A MOTOR VEHICLE (MOTORCYCLE/CAR/TRUCK/MOPED) OR HEAVY MACHINERY DO  NOT MIX NARCOTICS WITH OTHER CNS (CENTRAL NERVOUS SYSTEM) DEPRESSANTS SUCH AS ALCOHOL   POST-OPERATIVE OPIOID TAPER INSTRUCTIONS: It is important to wean off of your opioid medication as soon as possible. If you do not need pain medication after your surgery it is ok to stop day one. Opioids include: Codeine, Hydrocodone(Norco, Vicodin), Oxycodone(Percocet, oxycontin) and hydromorphone amongst others.  Long term and even short term use of opiods can cause: Increased pain response Dependence Constipation Depression Respiratory depression And more.  Withdrawal symptoms can include Flu like symptoms Nausea, vomiting And more Techniques to manage these symptoms Hydrate well Eat regular healthy meals Stay active Use relaxation techniques(deep breathing, meditating, yoga) Do Not substitute Alcohol to help with tapering If you have been on opioids for less than two weeks and do not have pain than it is ok to stop all together.  Plan to wean off of opioids This plan should start within one week post op of your fracture surgery  Maintain the same interval or time between taking each dose and first decrease the dose.  Cut the total daily intake of opioids by one tablet each day Next start to increase the time between doses. The last dose that should be eliminated is the evening dose.    STOP SMOKING OR USING NICOTINE PRODUCTS!!!!  As discussed nicotine severely impairs your body's ability to heal surgical and traumatic wounds but also impairs bone healing.  Wounds and bone heal by forming microscopic blood vessels (angiogenesis) and nicotine is a vasoconstrictor (essentially, shrinks blood vessels).  Therefore, if vasoconstriction occurs to these microscopic blood vessels they essentially disappear and are unable to deliver necessary nutrients to the  healing tissue.  This is one modifiable factor that you can do to dramatically increase your chances of healing your injury.    (This means no  smoking, no nicotine gum, patches, etc)  DO NOT USE NONSTEROIDAL ANTI-INFLAMMATORY DRUGS (NSAID'S)  Using products such as Advil (ibuprofen), Aleve (naproxen), Motrin (ibuprofen) for additional pain control during fracture healing can delay and/or prevent the healing response.  If you would like to take over the counter (OTC) medication, Tylenol (acetaminophen) is ok.  However, some narcotic medications that are given for pain control contain acetaminophen as well. Therefore, you should not exceed more than 4000 mg of tylenol in a day if you do not have liver disease.  Also note that there are may OTC medicines, such as cold medicines and allergy medicines that my contain tylenol as well.  If you have any questions about medications and/or interactions please ask your doctor/PA or your pharmacist.      ICE AND ELEVATE INJURED/OPERATIVE EXTREMITY  Using ice and elevating the injured extremity above your heart can help with swelling and pain control.  Icing in a pulsatile fashion, such as 20 minutes on and 20 minutes off, can be followed.    Do not place ice directly on skin. Make sure there is a barrier between to skin and the ice pack.    Using frozen items such as frozen peas works well as the conform nicely to the are that needs to be iced.  USE AN ACE WRAP OR TED HOSE FOR SWELLING CONTROL  In addition to icing and elevation, Ace wraps or TED hose are used to help limit and resolve swelling.  It is recommended to use Ace wraps or TED hose until you are informed to stop.    When using Ace Wraps start the wrapping distally (farthest away from the body) and wrap proximally (closer to the body)   Example: If you had surgery on your leg or thing and you do not have a splint on, start the ace wrap at the toes and work your way up to the thigh        If you had surgery on your upper extremity and do not have a splint on, start the ace wrap at your fingers and work your way up to the upper arm  IF YOU ARE  IN A SPLINT OR CAST DO NOT [place]   If your splint gets wet for any reason please contact the office immediately. You may shower in your splint or cast as long as you keep it dry.  This can be done by wrapping in a cast cover or garbage back (or similar)  Do Not stick any thing down your splint or cast such as pencils, money, or hangers to try and scratch yourself with.  If you feel itchy take benadryl as prescribed on the bottle for itching  IF YOU ARE IN A CAM BOOT (BLACK BOOT)  You may remove boot periodically. Perform daily dressing changes as noted below.  Wash the liner of the boot regularly and wear a sock when wearing the boot. It is recommended that you sleep in the boot until told otherwise    Call office for the following: Temperature greater than 101F Persistent nausea and vomiting Severe uncontrolled pain Redness, tenderness, or signs of infection (pain, swelling, redness, odor or green/yellow discharge around the site) Difficulty breathing, headache or visual disturbances Hives Persistent dizziness or light-headedness Extreme fatigue Any other questions or concerns you  may have after discharge  In an emergency, call 911 or go to an Emergency Department at a nearby hospital  HELPFUL INFORMATION  If you had a block, it will wear off between 8-24 hrs postop typically.  This is period when your pain may go from nearly zero to the pain you would have had postop without the block.  This is an abrupt transition but nothing dangerous is happening.  You may take an extra dose of narcotic when this happens.  You should wean off your narcotic medicines as soon as you are able.  Most patients will be off or using minimal narcotics before their first postop appointment.   We suggest you use the pain medication the first night prior to going to bed, in order to ease any pain when the anesthesia wears off. You should avoid taking pain medications on an empty stomach as it  will make you nauseous.  Do not drink alcoholic beverages or take illicit drugs when taking pain medications.  In most states it is against the law to drive while you are in a splint or sling.  And certainly against the law to drive while taking narcotics.  You may return to work/school in the next couple of days when you feel up to it.   Pain medication may make you constipated.  Below are a few solutions to try in this order: Decrease the amount of pain medication if you arent having pain. Drink lots of decaffeinated fluids. Drink prune juice and/or each dried prunes  If the first 3 dont work start with additional solutions Take Colace - an over-the-counter stool softener Take Senokot - an over-the-counter laxative Take Miralax - a stronger over-the-counter laxative     CALL THE OFFICE WITH ANY QUESTIONS OR CONCERNS: [phone]   VISIT OUR WEBSITE FOR ADDITIONAL INFORMATION: [email or web address]

## 2021-04-11 NOTE — Evaluation (Signed)
Occupational Therapy Evaluation ?Patient Details ?Name: Tammy [name] ?MRN: [id number] ?DOB: October 20, 1955 ?Today's Date: 04/11/2021 ? ? ?History of Present Illness Pt is a 66 y.o. F who presents after a fall with left comminuted and displaced fracture of distal femur metaphysis s/p ORIF 04/10/2021. 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  ? ?Clinical Impression ?  ?Pt was independent and active prior to admission. Presents with impaired balance, but ability to maintain NWB in standing with use of RW and verbal cues for technique. Pt's pain is well managed. She needs set up to min assist for ADLs. She is likely to progress well. Will follow acutely. ?   ? ?Recommendations for follow up therapy are one component of a multi-disciplinary discharge planning process, led by the attending physician.  Recommendations may be updated based on patient status, additional functional criteria and insurance authorization.  ? ?Follow Up Recommendations ? Acute inpatient rehab (3hours/day)  ?  ?Assistance Recommended at Discharge Frequent or constant Supervision/Assistance  ?Patient can return home with the following A little help with walking and/or transfers;A little help with bathing/dressing/bathroom;Assistance with cooking/housework;Assist for transportation;Help with stairs or ramp for entrance ? ?  ?Functional Status Assessment ? Patient has had a recent decline in their functional status and demonstrates the ability to make significant improvements in function in a reasonable and predictable amount of time.  ?Equipment Recommendations ? BSC/3in1  ?  ?Recommendations for Other Services   ? ? ?  ?Precautions / Restrictions Precautions ?Precautions: Fall ?Required Braces or Orthoses: Other Brace ?Other Brace: L hinged knee brace, unlocked ?Restrictions ?Weight Bearing Restrictions: Yes ?LLE Weight Bearing: Non weight bearing ?Other  Position/Activity Restrictions: No LLE ROM restrictions  ? ?  ? ?Mobility Bed Mobility ?  ?  ?  ?  ?  ?  ?  ?General bed mobility comments: pt in chair ?  ? ?Transfers ?Overall transfer level: Needs assistance ?Equipment used: Rolling walker (2 wheels) ?Transfers: Sit to/from Stand ?Sit to Stand: Min guard ?  ?  ?  ?  ?  ?General transfer comment: verbal cues for technique and hand placement ?  ? ?  ?Balance Overall balance assessment: Needs assistance ?Sitting-balance support: Feet supported ?Sitting balance-Leahy Scale: Good ?Sitting balance - Comments: can reach feet in sitting ?  ?Standing balance support: Bilateral upper extremity supported ?Standing balance-Leahy Scale: Poor ?Standing balance comment: heavily reliant on arms ?  ?  ?  ?  ?  ?  ?  ?  ?  ?  ?  ?   ? ?ADL either performed or assessed with clinical judgement  ? ?ADL Overall ADL's : Needs assistance/impaired ?Eating/Feeding: Independent;Sitting ?  ?Grooming: Set up;Sitting ?  ?Upper Body Bathing: Set up;Sitting ?  ?Lower Body Bathing: Minimal assistance;Sit to/from stand ?  ?Upper Body Dressing : Set up;Sitting ?  ?Lower Body Dressing: Minimal assistance;Sit to/from stand ?  ?Toilet Transfer: Min guard;BSC/3in1;Rolling walker (2 wheels) ?  ?Toileting- Clothing Manipulation and Hygiene: Set up;Sitting/lateral lean ?  ?  ?  ?  ?   ? ? ? ?Vision Ability to See in Adequate Light: 0 Adequate ?Patient Visual Report: No change from baseline ?   ?   ?Perception   ?  ?Praxis   ?  ? ?Pertinent Vitals/Pain Pain Assessment ?Pain Assessment: No/denies pain  ? ? ? ?Hand Dominance Right ?  ?Extremity/Trunk Assessment Upper Extremity Assessment ?Upper Extremity Assessment: Overall WFL for tasks assessed ?  ?  Lower Extremity Assessment ?Lower Extremity Assessment: Defer to PT evaluation ?LLE Deficits / Details: Femur fx s/p ORIF. Grossly weak, with limited heel slide, ankle DF WFL ?  ?Cervical / Trunk Assessment ?Cervical / Trunk Assessment: Normal ?  ?Communication  Communication ?Communication: No difficulties ?  ?Cognition Arousal/Alertness: Awake/alert ?Behavior During Therapy: [facility] for tasks assessed/performed ?Overall Cognitive Status: Within Functional Limits for tasks assessed ?  ?  ?  ?  ?  ?  ?  ?  ?  ?  ?  ?  ?  ?  ?  ?  ?  ?  ?  ?General Comments    ? ?  ?Exercises   ?  ?Shoulder Instructions    ? ? ?Home Living Family/patient expects to be discharged to:: Private residence ?Living Arrangements: Spouse/significant other (fiance) ?Available Help at Discharge: Family ?Type of Home: Mobile home ?Home Access: Stairs to enter ?Entrance Stairs-Number of Steps: 5 ?Entrance Stairs-Rails: Right;Left ?Home Layout: One level ?  ?  ?Bathroom Shower/Tub: Tub/shower unit ?  ?  ?  ?  ?Home Equipment: Shower seat ?  ?  ?  ? ?  ?Prior Functioning/Environment Prior Level of Function : Independent/Modified Independent ?  ?  ?  ?  ?  ?  ?Mobility Comments: retired Marine scientist ?  ?  ? ?  ?  ?OT Problem List: Impaired balance (sitting and/or standing);Decreased knowledge of use of DME or AE ?  ?   ?OT Treatment/Interventions: Self-care/ADL training;DME and/or AE instruction;Therapeutic activities;Patient/family education;Balance training  ?  ?OT Goals(Current goals can be found in the care plan section) Acute Rehab OT Goals ?OT Goal Formulation: With patient ?Time For Goal Achievement: 04/25/21 ?Potential to Achieve Goals: Good ?ADL Goals ?Pt Will Perform Grooming: with set-up;sitting ?Pt Will Perform Lower Body Bathing: sitting/lateral leans;with set-up ?Pt Will Perform Lower Body Dressing: sitting/lateral leans;with set-up ?Pt Will Transfer to Toilet: with modified independence;ambulating;bedside commode (over toilet) ?Pt Will Perform Toileting - Clothing Manipulation and hygiene: with modified independence;sitting/lateral leans ?Additional ADL Goal #1: Pt will maintain NWB on L LE during ADLs and ADL transfers.  ?OT Frequency: Min 2X/week ?  ? ?Co-evaluation   ?  ?  ?  ?  ? ?  ?AM-PAC OT "6  Clicks" Daily Activity     ?Outcome Measure Help from another person eating meals?: None ?Help from another person taking care of personal grooming?: A Little ?Help from another person toileting, which includes using toliet, bedpan, or urinal?: A Little ?Help from another person bathing (including washing, rinsing, drying)?: A Little ?Help from another person to put on and taking off regular upper body clothing?: A Little ?Help from another person to put on and taking off regular lower body clothing?: A Little ?6 Click Score: 19 ?  ?End of Session Equipment Utilized During Treatment: Rolling walker (2 wheels);Left knee immobilizer ? ?Activity Tolerance: Patient tolerated treatment well ?Patient left: in chair;with call bell/phone within reach;with chair alarm set;with family/visitor present ? ?OT Visit Diagnosis: Unsteadiness on feet (R26.81);Other abnormalities of gait and mobility (R26.89);Pain  ?              ?Time: 4132-4401 ?OT Time Calculation (min): 24 min ?Charges:  OT General Charges ?$OT Visit: 1 Visit ?OT Evaluation ?$OT Eval Moderate Complexity: 1 Mod ?OT Treatments ?$Self Care/Home Management : 8-22 mins ? ?Nestor Lewandowsky, OTR/L ?Acute Rehabilitation Services ?Pager: [phone] ?Office: [phone]  ? ?Malka So ?04/11/2021, 3:44 PM ?

## 2021-04-11 NOTE — Plan of Care (Signed)
?  Problem: Education: ?Goal: Knowledge of General Education information will improve ?Description: Including pain rating scale, medication(s)/side effects and non-pharmacologic comfort measures ?04/11/2021 0640 by Guinevere Scarlet, RN ?Outcome: Progressing ?04/11/2021 0640 by Guinevere Scarlet, RN ?Outcome: Progressing ?  ?Problem: Health Behavior/Discharge Planning: ?Goal: Ability to manage health-related needs will improve ?04/11/2021 0640 by Guinevere Scarlet, RN ?Outcome: Progressing ?04/11/2021 0640 by Guinevere Scarlet, RN ?Outcome: Progressing ?  ?Problem: Clinical Measurements: ?Goal: Ability to maintain clinical measurements within normal limits will improve ?04/11/2021 0640 by Guinevere Scarlet, RN ?Outcome: Progressing ?04/11/2021 0640 by Guinevere Scarlet, RN ?Outcome: Progressing ?Goal: Will remain free from infection ?Outcome: Progressing ?Goal: Diagnostic test results will improve ?Outcome: Progressing ?Goal: Respiratory complications will improve ?Outcome: Progressing ?Goal: Cardiovascular complication will be avoided ?Outcome: Progressing ?  ?Problem: Activity: ?Goal: Risk for activity intolerance will decrease ?Outcome: Progressing ?  ?Problem: Nutrition: ?Goal: Adequate nutrition will be maintained ?Outcome: Progressing ?  ?Problem: Coping: ?Goal: Level of anxiety will decrease ?Outcome: Progressing ?  ?Problem: Elimination: ?Goal: Will not experience complications related to bowel motility ?Outcome: Progressing ?Goal: Will not experience complications related to urinary retention ?Outcome: Progressing ?  ?Problem: Pain Managment: ?Goal: General experience of comfort will improve ?Outcome: Progressing ?  ?Problem: Safety: ?Goal: Ability to remain free from injury will improve ?Outcome: Progressing ?  ?Problem: Skin Integrity: ?Goal: Risk for impaired skin integrity will decrease ?Outcome: Progressing ?  ?

## 2021-04-12 DIAGNOSIS — E559 Vitamin D deficiency, unspecified: Secondary | ICD-10-CM | POA: Diagnosis present

## 2021-04-12 LAB — CBC
HCT: 25.6 % — ABNORMAL LOW (ref 36.0–46.0)
Hemoglobin: 8.2 g/dL — ABNORMAL LOW (ref 12.0–15.0)
MCH: 28 pg (ref 26.0–34.0)
MCHC: 32 g/dL (ref 30.0–36.0)
MCV: 87.4 fL (ref 80.0–100.0)
Platelets: 182 10*3/uL (ref 150–400)
RBC: 2.93 MIL/uL — ABNORMAL LOW (ref 3.87–5.11)
RDW: 14.4 % (ref 11.5–15.5)
WBC: 8.9 10*3/uL (ref 4.0–10.5)
nRBC: 0 % (ref 0.0–0.2)

## 2021-04-12 LAB — BASIC METABOLIC PANEL
Anion gap: 7 (ref 5–15)
BUN: 30 mg/dL — ABNORMAL HIGH (ref 8–23)
CO2: 26 mmol/L (ref 22–32)
Calcium: 8.8 mg/dL — ABNORMAL LOW (ref 8.9–10.3)
Chloride: 105 mmol/L (ref 98–111)
Creatinine, Ser: 0.81 mg/dL (ref 0.44–1.00)
GFR, Estimated: >60 mL/min (ref >60)
Glucose, Bld: 106 mg/dL — ABNORMAL HIGH (ref 70–99)
Potassium: 3.7 mmol/L (ref 3.5–5.1)
Sodium: 138 mmol/L (ref 135–145)

## 2021-04-12 MED ORDER — VITAMIN D (ERGOCALCIFEROL) 1.25 MG (50000 UNIT) PO CAPS
50000.0000 [IU] | ORAL_CAPSULE | ORAL | Status: DC
  Administered 2021-04-14: 50000 [IU] via ORAL
  Filled 2021-04-12 (×2): qty 1

## 2021-04-12 NOTE — Progress Notes (Signed)
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  She was found to have a left comminuted and displaced fracture of the distal femur metaphysis, orthopedic surgery was consulted and she is status post operative repair on 3/2.  Subjective / 24h Interval events: No significant complaints, pain is controlled.  Worked with PT yesterday, did well, CIR was recommended  Assesement and Plan: Principal Problem:   Left comminuted and displaced fracture of the distal femur metaphysis Active Problems:   HTN (hypertension)   Bipolar disorder, unspecified ([place])   Malignant neoplasm of upper-outer quadrant of left breast in female, estrogen receptor positive ([place])   Anemia   History of CVA (cerebrovascular accident)   Hyponatremia   Acute postoperative anemia due to expected blood loss   Class 1 obesity   Vitamin D deficiency  Assessment and Plan: Principal problem Left comminuted and displaced fracture of the distal femoral metaphysis-orthopedic surgery consulted, followed patient while hospitalized.  She was taken to the OR on 3/2 by Dr. Marcelino Scot and she is status post ORIF left distal femur with intercondylar extension.  For now orthopedic surgery recommends nonweightbearing on the operative extremity with early mobilization encouraged.  DVT prophylaxis will be with Eliquis given high risk for thromboembolic complications.  PT recommends CIR, awaiting evaluation from rehab team.  Active problems Hyponatremia-mild, resolved.  Due to HCTZ.  Sodium normalized this morning  Anemia, acute  blood loss, postoperatively-monitor hemoglobin, no active bleeding.  There is a component of anemia of chronic disease in the setting of underlying malignancy  Estrogen receptor positive breast cancer, history of lumpectomy and XRT-continue anastrozole  Compensated liver cirrhosis, due to hep C-follows as an outpatient.  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, she would benefit from weight loss  Vitamin D deficiency-replete  Scheduled Meds:  acetaminophen  500 mg Oral Q8H   amLODipine  10 mg Oral Daily   anastrozole  1 mg Oral Daily   apixaban  2.5 mg Oral BID   vitamin C  500 mg Oral Daily   atenolol  25 mg Oral Daily   cholecalciferol  2,000 Units Oral BID   docusate sodium  100 mg Oral BID   gabapentin  100 mg Oral BID   hydrALAZINE  25 mg Oral BID   mupirocin ointment  1 application Nasal BID   pantoprazole  40 mg Oral Daily   Vitamin D (Ergocalciferol)  50,000 Units Oral Q7 days   zinc sulfate  220 mg Oral Daily   Continuous Infusions:   PRN Meds:.HYDROcodone-acetaminophen, metoCLOPramide **OR** metoCLOPramide (REGLAN) injection, morphine injection, ondansetron **OR** ondansetron (ZOFRAN) IV, senna-docusate  Diet Orders (From admission, onward)     Start     Ordered   04/10/21 1345  Diet regular Room service appropriate? Yes; Fluid consistency: Thin  Diet effective now       Question Answer Comment  Room service appropriate? Yes   Fluid consistency: Thin      04/10/21 1344            DVT prophylaxis: apixaban (ELIQUIS) tablet 2.5 mg Start: 04/11/21  1115 SCDs Start: 04/10/21 1342 SCDs Start: 04/08/21 2318 apixaban (ELIQUIS) tablet 2.5 mg   Lab Results  Component Value Date   PLT 182 04/12/2021      Code Status: Full Code  Family Communication: no family at bedside   Status is: Inpatient  Remains inpatient appropriate because: needs placement  Level of care: Telemetry  Medical  Consultants:  Orthopedic surgery  Procedures:  ORIF 3/2  Microbiology  none  Antimicrobials: none    Objective: Vitals:   04/12/21 0512 04/12/21 0741 04/12/21 0925 04/12/21 0930  BP: (!) 120/59 122/66  131/60  Pulse: (!) 50 (!) 58 (!) 59 (!) 54  Resp: '16 14 16   '$ Temp:  98.3 F (36.8 C)    TempSrc:  Oral    SpO2: 96% 100% 100%   Weight:      Height:        Intake/Output Summary (Last 24 hours) at 04/12/2021 1142 Last data filed at 04/12/2021 0513 Gross per 24 hour  Intake 1110 ml  Output 2100 ml  Net -990 ml   Wt Readings from Last 3 Encounters:  04/08/21 77.6 kg  10/21/20 77.8 kg  08/21/20 74.6 kg    Examination:  Constitutional: NAD Eyes: lids and conjunctivae normal, no scleral icterus ENMT: mmm Neck: normal, supple Respiratory: clear to auscultation bilaterally, no wheezing, no crackles. Normal respiratory effort.  Cardiovascular: Regular rate and rhythm, no murmurs / rubs / gallops. No LE edema. Abdomen: soft, no distention, no tenderness. Bowel sounds positive.  Skin: no rashes Neurologic: no focal deficits, equal strength   Data Reviewed: I have independently reviewed following labs and imaging studies  CBC Recent Labs  Lab 04/08/21 2136 04/10/21 0536 04/11/21 0219 04/12/21 0231  WBC 10.6* 9.8 11.5* 8.9  HGB 10.9* 11.1* 9.1* 8.2*  HCT 33.9* 35.5* 28.1* 25.6*  PLT 208 199 182 182  MCV 88.3 89.0 87.8 87.4  MCH 28.4 27.8 28.4 28.0  MCHC 32.2 31.3 32.4 32.0  RDW 14.7 14.3 14.2 14.4    Recent Labs  Lab 04/08/21 2136 04/10/21 0536 04/11/21 0219 04/12/21 0231  NA 137 133* 138 138  K 3.6 4.3 4.3 3.7  CL 103 100 103 105  CO2 25 21* 23 26  GLUCOSE 115* 117* 117* 106*  BUN 14 13 24* 30*  CREATININE 0.74 0.71 0.88 0.81  CALCIUM 9.3 9.2 9.0 8.8*  MG  --  2.4  --   --     ------------------------------------------------------------------------------------------------------------------ No results for input(s): CHOL, HDL, LDLCALC,  TRIG, CHOLHDL, LDLDIRECT in the last 72 hours.  Lab Results  Component Value Date   HGBA1C 5.6 12/01/2018   ------------------------------------------------------------------------------------------------------------------ No results for input(s): TSH, T4TOTAL, T3FREE, THYROIDAB in the last 72 hours.  Invalid input(s): FREET3  Cardiac Enzymes No results for input(s): CKMB, TROPONINI, MYOGLOBIN in the last 168 hours.  Invalid input(s): CK ------------------------------------------------------------------------------------------------------------------ No results found for: BNP  CBG: No results for input(s): GLUCAP in the last 168 hours.  Recent Results (from the past 240 hour(s))  Resp Panel by RT-PCR (Flu A&B, Covid) Nasopharyngeal Swab     Status: None   Collection Time: 04/08/21  9:37 PM   Specimen: Nasopharyngeal Swab; Nasopharyngeal(NP) swabs in vial transport medium  Result Value Ref Range Status   SARS Coronavirus 2 by RT PCR NEGATIVE NEGATIVE Final    Comment: (NOTE) SARS-CoV-2 target nucleic acids are NOT DETECTED.  The SARS-CoV-2 RNA is generally detectable in upper respiratory specimens during the acute phase of infection. The lowest concentration  of SARS-CoV-2 viral copies this assay can detect is 138 copies/mL. A negative result does not preclude SARS-Cov-2 infection and should not be used as the sole basis for treatment or other patient management decisions. A negative result may occur with  improper specimen collection/handling, submission of specimen other than nasopharyngeal swab, presence of viral mutation(s) within the areas targeted by this assay, and inadequate number of viral copies(<138 copies/mL). A negative result must be combined with clinical observations, patient history, and epidemiological information. The expected result is Negative.  Fact Sheet for Patients:  EntrepreneurPulse.com.au  Fact Sheet for Healthcare Providers:   IncredibleEmployment.be  This test is no t yet approved or cleared by the [place] FDA and  has been authorized for detection and/or diagnosis of SARS-CoV-2 by FDA under an Emergency Use Authorization (EUA). This EUA will remain  in effect (meaning this test can be used) for the duration of the COVID-19 declaration under Section 564(b)(1) of the Act, 21 U.S.C.section 360bbb-3(b)(1), unless the authorization is terminated  or revoked sooner.       Influenza A by PCR NEGATIVE NEGATIVE Final   Influenza B by PCR NEGATIVE NEGATIVE Final    Comment: (NOTE) The Xpert Xpress SARS-CoV-2/FLU/RSV plus assay is intended as an aid in the diagnosis of influenza from Nasopharyngeal swab specimens and should not be used as a sole basis for treatment. Nasal washings and aspirates are unacceptable for Xpert Xpress SARS-CoV-2/FLU/RSV testing.  Fact Sheet for Patients: EntrepreneurPulse.com.au  Fact Sheet for Healthcare Providers: IncredibleEmployment.be  This test is not yet approved or cleared by the [place] FDA and has been authorized for detection and/or diagnosis of SARS-CoV-2 by FDA under an Emergency Use Authorization (EUA). This EUA will remain in effect (meaning this test can be used) for the duration of the COVID-19 declaration under Section 564(b)(1) of the Act, 21 U.S.C. section 360bbb-3(b)(1), unless the authorization is terminated or revoked.  Performed at [facility], [place] [address] [place], [place] [address]   Surgical PCR screen     Status: None   Collection Time: 04/10/21  8:07 AM   Specimen: Nasal Mucosa; Nasal Swab  Result Value Ref Range Status   MRSA, PCR NEGATIVE NEGATIVE Final   Staphylococcus aureus NEGATIVE NEGATIVE Final    Comment: (NOTE) The Xpert SA Assay (FDA approved for NASAL specimens in patients 22 years of age and older), is one component of a  comprehensive surveillance program. It is not intended to diagnose infection nor to guide or monitor treatment. Performed at [facility], [place] [address], [place], [place] [address]      Radiology Studies: No results found.   Marzetta Board, MD, PhD [facility]  Between 7 am - 7 pm I am available, please contact me via Amion (for emergencies) or Securechat (non urgent messages)  Between 7 pm - 7 am I am not available, please contact night coverage MD/APP via Amion

## 2021-04-12 NOTE — Progress Notes (Signed)
Inpatient Rehab Admissions Coordinator:  ? ? I spoke with Pt. Regarding potential CIR admit. I explained that [facility] Medicare is unlikely to approve CIR admission, but Pt. Still wants to try. She is open to SNF if insurance denies CIR.  ? ?Clemens Catholic, MS, CCC-SLP ?Rehab Admissions Coordinator  ?[phone] (celll) ?[phone] (office) ? ?

## 2021-04-12 NOTE — Progress Notes (Signed)
ANTICOAGULATION CONSULT NOTE - Initial Consult ? ?Pharmacy Consult for Eliquis ?Indication: VTE prophylaxis s/p ORIF Left femur fx ? ?Allergies  ?Allergen Reactions  ? Lisinopril Cough  ? ? ?Patient Measurements: ?Height: '5\' 2"'$  (157.5 cm) ?Weight: 77.6 kg (171 lb) ?IBW/kg (Calculated) : 50.1 kg ? ? ?Vital Signs: ?Temp: 98.3 ?F (36.8 ?C) (03/04 7425) ?Temp Source: Oral (03/04 0741) ?BP: 131/60 (03/04 0930) ?Pulse Rate: 54 (03/04 0930) ? ?Labs: ?Recent Labs  ?  04/10/21 ?9563 04/11/21 ?8756 04/12/21 ?0231  ?HGB 11.1* 9.1* 8.2*  ?HCT 35.5* 28.1* 25.6*  ?PLT 199 182 182  ?CREATININE 0.71 0.88 0.81  ? ? ? ?Estimated Creatinine Clearance: 65.9 mL/min (by C-G formula based on SCr of 0.81 mg/dL). ? ? ?Medical History: ?Past Medical History:  ?Diagnosis Date  ? Arthritis   ? Bipolar disorder ([place])   ? Breast cancer ([place])   ? Depression   ? Family history of breast cancer 07/10/2020  ? Family history of colon cancer 07/10/2020  ? Family history of ovarian cancer 07/10/2020  ? GERD (gastroesophageal reflux disease)   ? HTN (hypertension)   ? Stroke [facility]) 09/2012  ? slurred speech and left sided weakness/notes 10/04/2012  ? ? ?Medications:  ?Medications Prior to Admission  ?Medication Sig Dispense Refill Last Dose  ? acetaminophen (TYLENOL) 500 MG tablet Take 1,000 mg by mouth every 6 (six) hours as needed for mild pain.   04/08/2021  ? amLODipine (NORVASC) 10 MG tablet Take 10 mg by mouth daily.   04/08/2021  ? anastrozole (ARIMIDEX) 1 MG tablet Take 1 tablet (1 mg total) by mouth daily. 90 tablet 4 04/08/2021  ? atenolol (TENORMIN) 25 MG tablet Take 25 mg by mouth daily.   04/08/2021 at 1000  ? gabapentin (NEURONTIN) 100 MG capsule Take 200 mg by mouth at bedtime.   04/08/2021  ? hydrALAZINE (APRESOLINE) 25 MG tablet Take 25 mg by mouth 2 (two) times daily.   04/08/2021  ? hydrochlorothiazide (HYDRODIURIL) 25 MG tablet Take 25 mg by mouth daily.   04/08/2021  ? HYDROcodone-acetaminophen (NORCO/VICODIN) 5-325 MG tablet Take 1 tablet  by mouth every 4 (four) hours as needed for pain.   04/08/2021  ? meloxicam (MOBIC) 15 MG tablet Take 15 mg by mouth daily.   04/08/2021  ? Multiple Vitamin (MULTIVITAMIN PO) Take 1 tablet by mouth daily.   04/08/2021  ? pantoprazole (PROTONIX) 40 MG tablet Take 40 mg by mouth daily.   04/08/2021  ? buPROPion (WELLBUTRIN SR) 150 MG 12 hr tablet Take 150 mg by mouth 2 (two) times daily.     ? gabapentin (NEURONTIN) 300 MG capsule Take 1-2 capsules (300-600 mg total) by mouth 2 (two) times daily. (Patient not taking: Reported on 04/09/2021) 360 capsule 2 Not Taking  ? ? ?Assessment: ?66 y.o female s/p ORIF L femur on 04/10/21.  ?Enoxabarin  40 mg SQ q24h>>changed to  Eliquis 2.5 mg bid for  VTE prophylaxis x 6 weeks post  ORIF surgery.  ?S/p ORIF femur rx LLE on 04/10/21 ?Last enoxaparin dose was given on 04/09/21 @ 16:27 preop, dose held postop day 3/2.  ? ? Hgb 9-11 stable, pltc wnl.  No bleeding reported ? ?Stop date for apixaban (DVT px) added.  ? ?Goal of Therapy:  ?Prevention of VTE ?Monitor platelets by anticoagulation protocol: Yes ?  ?Plan:  ?Apixaban 2.'5mg'$  BID until 4/9 ?Rx will monitor peripherally ? ?Onnie Boer, PharmD, BCIDP, AAHIVP, CPP ?Infectious Disease Pharmacist ?04/12/2021 11:59 AM ? ? ? ?

## 2021-04-12 NOTE — Progress Notes (Signed)
? ?                              Orthopaedic Trauma Service Progress Note ? ?Patient ID: ?Tammy [name] ?MRN: [id number] ?DOB/AGE: 67/05/1955 67 y.o. ? ?Subjective: ? ?Doing very well this am  ?Pain much improved from pre-op state ?No complaints ?She is hopeful to go to rehab ? ? ? ?ROS ?As above ? ?Objective:  ? ?VITALS:   ?Vitals:  ? 04/11/21 1105 04/11/21 2000 04/12/21 0512 04/12/21 0741  ?BP:  119/65 (!) 120/59 122/66  ?Pulse: (!) 55 (!) 53 (!) 50 (!) 58  ?Resp:  '18 16 14  '$ ?Temp:  98 ?F (36.7 ?C)  98.3 ?F (36.8 ?C)  ?TempSrc:  Oral  Oral  ?SpO2:  96% 96% 100%  ?Weight:      ?Height:      ? ? ?Estimated body mass index is 31.28 kg/m? as calculated from the following: ?  Height as of this encounter: '5\' 2"'$  (1.575 m). ?  Weight as of this encounter: 77.6 kg. ? ? ?Intake/Output   ?   03/03 0701 ?03/04 0700 03/04 0701 ?03/05 0700  ? P.O. 1320   ? I.V. (mL/kg)    ? IV Piggyback 150   ? Total Intake(mL/kg) 1470 (18.9)   ? Urine (mL/kg/hr) 2100 (1.1)   ? Stool    ? Blood    ? Total Output 2100   ? Net -283   ?     ?  ? ?LABS ? ?Results for orders placed or performed during the hospital encounter of 04/08/21 (from the past 24 hour(s))  ?VITAMIN D 25 Hydroxy (Vit-D Deficiency, Fractures)     Status: Abnormal  ? Collection Time: 04/11/21 10:19 AM  ?Result Value Ref Range  ? Vit D, 25-Hydroxy 10.88 (L) 30 - 100 ng/mL  ?CBC     Status: Abnormal  ? Collection Time: 04/12/21  2:31 AM  ?Result Value Ref Range  ? WBC 8.9 4.0 - 10.5 K/uL  ? RBC 2.93 (L) 3.87 - 5.11 MIL/uL  ? Hemoglobin 8.2 (L) 12.0 - 15.0 g/dL  ? HCT 25.6 (L) 36.0 - 46.0 %  ? MCV 87.4 80.0 - 100.0 fL  ? MCH 28.0 26.0 - 34.0 pg  ? MCHC 32.0 30.0 - 36.0 g/dL  ? RDW 14.4 11.5 - 15.5 %  ? Platelets 182 150 - 400 K/uL  ? nRBC 0.0 0.0 - 0.2 %  ?Basic metabolic panel     Status: Abnormal  ? Collection Time: 04/12/21  2:31 AM  ?Result Value Ref Range  ? Sodium 138 135 - 145 mmol/L  ? Potassium 3.7 3.5 - 5.1 mmol/L  ?  Chloride 105 98 - 111 mmol/L  ? CO2 26 22 - 32 mmol/L  ? Glucose, Bld 106 (H) 70 - 99 mg/dL  ? BUN 30 (H) 8 - 23 mg/dL  ? Creatinine, Ser 0.81 0.44 - 1.00 mg/dL  ? Calcium 8.8 (L) 8.9 - 10.3 mg/dL  ? GFR, Estimated >60 >60 mL/min  ? Anion gap 7 5 - 15  ? ? ? ?PHYSICAL EXAM:  ? ?Gen: sitting up in bed, NAD, appears well, very pleasant, appreciative for all the care she has received  ?Lungs: unlabored  ?Cardiac: reg ?Ext:  ?     Left Lower extremity  ? Hinged brace in place and unlocked, fitting well ? TED hose fitting well  ? Mepilex dressings with scant strikethrough  ?  Ext warm  ? + DP pulse ? No DCT ? Compartments are soft  ? No pain out of proportion with passive stretch  ? EHL, FHL, lesser toe motor intact ? Ankle flexion, extension, inversion and eversion intact ? Minimal swelling  ? Alignment nearly symmetric to contra-lateral side  ?  ?Assessment/Plan: ?2 Days Post-Op  ? ? ? ?Anti-infectives (From admission, onward)  ? ? Start     Dose/Rate Route Frequency Ordered Stop  ? 04/11/21 1600  ceFAZolin (ANCEF) IVPB 1 g/50 mL premix       ? 1 g ?100 mL/hr over 30 Minutes Intravenous Every 6 hours 04/11/21 0950 04/12/21 0434  ? 04/10/21 0845  ceFAZolin (ANCEF) IVPB 2g/100 mL premix       ? 2 g ?200 mL/hr over 30 Minutes Intravenous On call to O.R. 04/10/21 0831 04/10/21 1031  ? 04/10/21 0821  ceFAZolin (ANCEF) 2-4 GM/100ML-% IVPB       ?Note to Pharmacy: Debby Bud: cabinet override  ?    04/10/21 0100 04/10/21 1032  ? ?  ?. ? ?POD/HD#: 1 ? ?65 y/o female s/p ground level fall with comminuted L distal femur fracture ? ?-ground level fall ? ?-comminuted L distal femur fracture s/p ORIF  ? NWB L leg x 6-8 weeks---> walker for ambulation  ? Unrestricted ROM L knee in hinge  ?  Brace can be off when in bed  ? PT/OT evals ? Dressing changes on Sunday  ? ? Ice and elevate for swelling/pain control  ? ?PT- please teach HEP for L knee ROM- AROM, PROM.  No ROM restrictions.  Quad sets, SLR, LAQ, SAQ, heel slides,  stretching. ?Ankle theraband program, heel cord stretching, toe towel curls, etc ? ?No pillows under bend of knee when at rest, ok to place under heel to help work on extension. Can also use zero knee bone foam if available ? ?Hinged knee brace on at all times, unlocked.  Ok to work on [employer] without brace with therapist supervision.  Brace back on after ROM session if removed ?  ?- Pain management: ? Multimodal ? Minimize narcotics  ? ?- ABL anemia/Hemodynamics ? Monitor  ? ?- Medical issues  ? Per primary  ? ?- DVT/PE prophylaxis: ? Lovenox today ? Start eliquis tomorrow x 6 weeks ?- ID:  ? Periop abx ? ?- Metabolic Bone Disease: ? Fracture pattern/mechanism suggestive of fragility fracture ? Recommend DEXA in next 4-8 weeks ? Will refer to osteoporosis clinic  ? Vitamin d levels pending  ? ?- Activity: ? As above ? ?- Impediments to fracture healing: ? Poor bone quality/fragility fracture  ? Chronic pain/chronic opioid  use  ? ?- Dispo: ? Therapy evals ? TOC consult for SNF  ? ? ? ?Charlies Constable, MD ?Orthopaedic Surgery ? ? ? ? ?After 5pm and on the weekends please log on to Amion, go to orthopaedics and the look under the [facility] Call for the provider(s) on call. You can also call our office at [phone] and then follow the prompts to be connected to the call team.  ? Patient ID: Tammy [name], female   DOB: 1955/03/02, 66 y.o.   MRN: [id number] ? ?

## 2021-04-12 NOTE — Progress Notes (Signed)
Inpatient Rehab Admissions Coordinator:  ?  ?I spoke with Pt. Regarding potential CIR admit and let her know that her insurance is very unlikely to approve CIR based on her diagnosis. She states that she still may want to try and get approved but is also considering [facility]. She will get back with me with a decision.  ? ?Clemens Catholic, MS, CCC-SLP ?Rehab Admissions Coordinator  ?[phone] (celll) ?[phone] (office) ? ?

## 2021-04-12 NOTE — Plan of Care (Signed)
  Problem: Activity: Goal: Risk for activity intolerance will decrease Outcome: Progressing   Problem: Nutrition: Goal: Adequate nutrition will be maintained Outcome: Progressing   Problem: Coping: Goal: Level of anxiety will decrease Outcome: Progressing   Problem: Elimination: Goal: Will not experience complications related to bowel motility Outcome: Progressing   Problem: Pain Managment: Goal: General experience of comfort will improve Outcome: Progressing   

## 2021-04-12 NOTE — Plan of Care (Signed)

## 2021-04-12 NOTE — Plan of Care (Signed)
  Problem: Coping: Goal: Level of anxiety will decrease Outcome: Progressing   Problem: Pain Managment: Goal: General experience of comfort will improve Outcome: Progressing   Problem: Safety: Goal: Ability to remain free from injury will improve Outcome: Progressing   Problem: Skin Integrity: Goal: Risk for impaired skin integrity will decrease Outcome: Progressing   

## 2021-04-13 NOTE — Progress Notes (Signed)
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  She was found to have a left comminuted and displaced fracture of the distal femur metaphysis, orthopedic surgery was consulted and she is status post operative repair on 3/2.  Subjective / 24h Interval events: Complains of constipation  Assesement and Plan: Principal Problem:   Left comminuted and displaced fracture of the distal femur metaphysis Active Problems:   HTN (hypertension)   Bipolar disorder, unspecified (HCC)   Malignant neoplasm of upper-outer quadrant of left breast in female, estrogen receptor positive ([place])   Anemia   History of CVA (cerebrovascular accident)   Hyponatremia   Acute postoperative anemia due to expected blood loss   Class 1 obesity   Vitamin D deficiency  Assessment and Plan: Principal problem Left comminuted and displaced fracture of the distal femoral metaphysis-orthopedic surgery consulted, followed patient while hospitalized.  She was taken to the OR on 3/2 by Dr. Marcelino Scot and she is status post ORIF left distal femur with intercondylar extension.  For now orthopedic surgery recommends nonweightbearing on the operative extremity with early mobilization encouraged.  DVT prophylaxis will be with Eliquis given high risk for thromboembolic complications.  PT recommends CIR, awaiting insurance authorization  Active problems Hyponatremia-mild, resolved.  Due to HCTZ.  Sodium remains normal  Anemia, acute blood loss, postoperatively-monitor hemoglobin, no active bleeding.  There is a component of  anemia of chronic disease in the setting of underlying malignancy.  Hemoglobin stable  Estrogen receptor positive breast cancer, history of lumpectomy and XRT-continue anastrozole  Constipation-increase bowel regimen  Compensated liver cirrhosis, due to hep C-follows as an outpatient.  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, she would benefit from weight loss  Vitamin D deficiency-replete  Scheduled Meds:  acetaminophen  500 mg Oral Q8H   amLODipine  10 mg Oral Daily   anastrozole  1 mg Oral Daily   apixaban  2.5 mg Oral BID   vitamin C  500 mg Oral Daily   atenolol  25 mg Oral Daily   cholecalciferol  2,000 Units Oral BID   docusate sodium  100 mg Oral BID   gabapentin  100 mg Oral BID   hydrALAZINE  25 mg Oral BID   mupirocin ointment  1 application Nasal BID   pantoprazole  40 mg Oral Daily   Vitamin D (Ergocalciferol)  50,000 Units Oral Q7 days   zinc sulfate  220 mg Oral Daily   Continuous Infusions:   PRN Meds:.HYDROcodone-acetaminophen, metoCLOPramide **OR** metoCLOPramide (REGLAN) injection, morphine injection, ondansetron **OR** ondansetron (ZOFRAN) IV, senna-docusate  Diet Orders (From admission, onward)     Start     Ordered   04/10/21 1345  Diet regular Room service appropriate? Yes; Fluid consistency: Thin  Diet effective now       Question Answer Comment  Room service appropriate? Yes   Fluid consistency: Thin      04/10/21 1344            DVT prophylaxis: apixaban (ELIQUIS) tablet 2.5 mg Start: 04/11/21 1115 SCDs Start: 04/10/21 1342 SCDs Start: 04/08/21 2318  apixaban (ELIQUIS) tablet 2.5 mg   Lab Results  Component Value Date   PLT 182 04/12/2021      Code Status: Full Code  Family Communication: no family at bedside   Status is: Inpatient  Remains inpatient appropriate because: needs placement  Level of care: Telemetry Medical  Consultants:   Orthopedic surgery  Procedures:  ORIF 3/2  Microbiology  none  Antimicrobials: none    Objective: Vitals:   04/12/21 0930 04/12/21 1532 04/12/21 2146 04/13/21 0739  BP: 131/60 (!) 142/63 139/60 (!) 141/68  Pulse: (!) 54 66 61 67  Resp:  '19 19 17  '$ Temp:  98.5 F (36.9 C) 97.7 F (36.5 C) 98.8 F (37.1 C)  TempSrc:  Oral Oral Oral  SpO2:   100%   Weight:      Height:       No intake or output data in the 24 hours ending 04/13/21 1404  Wt Readings from Last 3 Encounters:  04/08/21 77.6 kg  10/21/20 77.8 kg  08/21/20 74.6 kg    Examination:  Constitutional: NAD Eyes: lids and conjunctivae normal, no scleral icterus ENMT: mmm Neck: normal, supple Respiratory: clear to auscultation bilaterally, no wheezing, no crackles. Normal respiratory effort.  Cardiovascular: Regular rate and rhythm, no murmurs / rubs / gallops. No LE edema. Abdomen: soft, no distention, no tenderness. Bowel sounds positive.  Skin: no rashes Neurologic: no focal deficits, equal strength   Data Reviewed: I have independently reviewed following labs and imaging studies  CBC Recent Labs  Lab 04/08/21 2136 04/10/21 0536 04/11/21 0219 04/12/21 0231  WBC 10.6* 9.8 11.5* 8.9  HGB 10.9* 11.1* 9.1* 8.2*  HCT 33.9* 35.5* 28.1* 25.6*  PLT 208 199 182 182  MCV 88.3 89.0 87.8 87.4  MCH 28.4 27.8 28.4 28.0  MCHC 32.2 31.3 32.4 32.0  RDW 14.7 14.3 14.2 14.4     Recent Labs  Lab 04/08/21 2136 04/10/21 0536 04/11/21 0219 04/12/21 0231  NA 137 133* 138 138  K 3.6 4.3 4.3 3.7  CL 103 100 103 105  CO2 25 21* 23 26  GLUCOSE 115* 117* 117* 106*  BUN 14 13 24* 30*  CREATININE 0.74 0.71 0.88 0.81  CALCIUM 9.3 9.2 9.0 8.8*  MG  --  2.4  --   --      ------------------------------------------------------------------------------------------------------------------ No results for input(s): CHOL, HDL, LDLCALC, TRIG, CHOLHDL, LDLDIRECT in the last 72 hours.  Lab Results  Component Value  Date   HGBA1C 5.6 12/01/2018   ------------------------------------------------------------------------------------------------------------------ No results for input(s): TSH, T4TOTAL, T3FREE, THYROIDAB in the last 72 hours.  Invalid input(s): FREET3  Cardiac Enzymes No results for input(s): CKMB, TROPONINI, MYOGLOBIN in the last 168 hours.  Invalid input(s): CK ------------------------------------------------------------------------------------------------------------------ No results found for: BNP  CBG: No results for input(s): GLUCAP in the last 168 hours.  Recent Results (from the past 240 hour(s))  Resp Panel by RT-PCR (Flu A&B, Covid) Nasopharyngeal Swab     Status: None   Collection Time: 04/08/21  9:37 PM   Specimen: Nasopharyngeal Swab; Nasopharyngeal(NP) swabs in vial transport medium  Result Value Ref Range Status   SARS Coronavirus 2 by RT PCR NEGATIVE NEGATIVE Final    Comment: (NOTE) SARS-CoV-2 target nucleic acids are NOT DETECTED.  The SARS-CoV-2 RNA is generally detectable in upper respiratory specimens during the acute phase of infection. The lowest concentration of SARS-CoV-2 viral copies this assay can detect is 138 copies/mL. A negative result does not preclude SARS-Cov-2 infection and should not be  used as the sole basis for treatment or other patient management decisions. A negative result may occur with  improper specimen collection/handling, submission of specimen other than nasopharyngeal swab, presence of viral mutation(s) within the areas targeted by this assay, and inadequate number of viral copies(<138 copies/mL). A negative result must be combined with clinical observations, patient history, and epidemiological information. The expected result is Negative.  Fact Sheet for Patients:  EntrepreneurPulse.com.au  Fact Sheet for Healthcare Providers:  IncredibleEmployment.be  This test is no t yet approved or  cleared by the [place] FDA and  has been authorized for detection and/or diagnosis of SARS-CoV-2 by FDA under an Emergency Use Authorization (EUA). This EUA will remain  in effect (meaning this test can be used) for the duration of the COVID-19 declaration under Section 564(b)(1) of the Act, 21 U.S.C.section 360bbb-3(b)(1), unless the authorization is terminated  or revoked sooner.       Influenza A by PCR NEGATIVE NEGATIVE Final   Influenza B by PCR NEGATIVE NEGATIVE Final    Comment: (NOTE) The Xpert Xpress SARS-CoV-2/FLU/RSV plus assay is intended as an aid in the diagnosis of influenza from Nasopharyngeal swab specimens and should not be used as a sole basis for treatment. Nasal washings and aspirates are unacceptable for Xpert Xpress SARS-CoV-2/FLU/RSV testing.  Fact Sheet for Patients: EntrepreneurPulse.com.au  Fact Sheet for Healthcare Providers: IncredibleEmployment.be  This test is not yet approved or cleared by the [place] FDA and has been authorized for detection and/or diagnosis of SARS-CoV-2 by FDA under an Emergency Use Authorization (EUA). This EUA will remain in effect (meaning this test can be used) for the duration of the COVID-19 declaration under Section 564(b)(1) of the Act, 21 U.S.C. section 360bbb-3(b)(1), unless the authorization is terminated or revoked.  Performed at [facility], [place] [address], [place], [place] [address]   Surgical PCR screen     Status: None   Collection Time: 04/10/21  8:07 AM   Specimen: Nasal Mucosa; Nasal Swab  Result Value Ref Range Status   MRSA, PCR NEGATIVE NEGATIVE Final   Staphylococcus aureus NEGATIVE NEGATIVE Final    Comment: (NOTE) The Xpert SA Assay (FDA approved for NASAL specimens in patients 20 years of age and older), is one component of a comprehensive surveillance program. It is not intended to diagnose infection nor to guide or monitor  treatment. Performed at [facility], [place] [address] [place], [place] [address]      Radiology Studies: No results found.   Marzetta Board, MD, PhD [facility]  Between 7 am - 7 pm I am available, please contact me via Amion (for emergencies) or Securechat (non urgent messages)  Between 7 pm - 7 am I am not available, please contact night coverage MD/APP via Amion

## 2021-04-13 NOTE — Plan of Care (Signed)
  Problem: Activity: Goal: Risk for activity intolerance will decrease Outcome: Progressing   Problem: Nutrition: Goal: Adequate nutrition will be maintained Outcome: Progressing   Problem: Coping: Goal: Level of anxiety will decrease Outcome: Progressing   Problem: Elimination: Goal: Will not experience complications related to bowel motility Outcome: Progressing   

## 2021-04-14 ENCOUNTER — Other Ambulatory Visit: Payer: Self-pay

## 2021-04-14 LAB — CBC
HCT: 26.9 % — ABNORMAL LOW (ref 36.0–46.0)
Hemoglobin: 8.7 g/dL — ABNORMAL LOW (ref 12.0–15.0)
MCH: 28.4 pg (ref 26.0–34.0)
MCHC: 32.3 g/dL (ref 30.0–36.0)
MCV: 87.9 fL (ref 80.0–100.0)
Platelets: 287 10*3/uL (ref 150–400)
RBC: 3.06 MIL/uL — ABNORMAL LOW (ref 3.87–5.11)
RDW: 14.3 % (ref 11.5–15.5)
WBC: 8.1 10*3/uL (ref 4.0–10.5)
nRBC: 0 % (ref 0.0–0.2)

## 2021-04-14 LAB — CALCIUM, IONIZED: Calcium, Ionized, Serum: 4.9 mg/dL (ref 4.5–5.6)

## 2021-04-14 MED ORDER — OXYCODONE HCL 5 MG PO TABS
5.0000 mg | ORAL_TABLET | Freq: Four times a day (QID) | ORAL | Status: DC | PRN
  Administered 2021-04-14 – 2021-04-16 (×7): 10 mg via ORAL
  Filled 2021-04-14 (×8): qty 2

## 2021-04-14 NOTE — Progress Notes (Signed)
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  She was found to have a left comminuted and displaced fracture of the distal femur metaphysis, orthopedic surgery was consulted and she is status post operative repair on 3/2.  Subjective / 24h Interval events: Constipation is better this morning.  No other significant complaints.  Assesement and Plan: Principal Problem:   Left comminuted and displaced fracture of the distal femur metaphysis Active Problems:   HTN (hypertension)   Bipolar disorder, unspecified (HCC)   Malignant neoplasm of upper-outer quadrant of left breast in female, estrogen receptor positive ([place])   Anemia   History of CVA (cerebrovascular accident)   Hyponatremia   Acute postoperative anemia due to expected blood loss   Class 1 obesity   Vitamin D deficiency  Assessment and Plan: Principal problem Left comminuted and displaced fracture of the distal femoral metaphysis-orthopedic surgery consulted, followed patient while hospitalized.  She was taken to the OR on 3/2 by Dr. Marcelino Scot and she is status post ORIF left distal femur with intercondylar extension.  For now orthopedic surgery recommends nonweightbearing on the operative extremity with early mobilization encouraged.  DVT prophylaxis will be with Eliquis given high risk for thromboembolic complications.  PT recommends CIR, will discuss with admission coordinators from rehab, likely needs insurance authorization.  Patient wants to pursue CIR  Active problems Hyponatremia-mild, resolved.  Due to HCTZ.   Repeat sodium in a.m.  Anemia, acute blood loss, postoperatively-monitor hemoglobin, no active bleeding.  There is a component of anemia of chronic disease in the setting of underlying malignancy.  Repeat hemoglobin in a.m.  Estrogen receptor positive breast cancer, history of lumpectomy and XRT-continue anastrozole  Constipation-increase bowel regimen  Compensated liver cirrhosis, due to hep C-follows as an outpatient.  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, she would benefit from weight loss  Vitamin D deficiency-replete  Scheduled Meds:  acetaminophen  500 mg Oral Q8H   amLODipine  10 mg Oral Daily   anastrozole  1 mg Oral Daily   apixaban  2.5 mg Oral BID   vitamin C  500 mg Oral Daily   cholecalciferol  2,000 Units Oral BID   docusate sodium  100 mg Oral BID   gabapentin  100 mg Oral BID   hydrALAZINE  25 mg Oral BID   mupirocin ointment  1 application Nasal BID   pantoprazole  40 mg Oral Daily   Vitamin D (Ergocalciferol)  50,000 Units Oral Q7 days   zinc sulfate  220 mg Oral Daily   Continuous Infusions:   PRN Meds:.HYDROcodone-acetaminophen, metoCLOPramide **OR** metoCLOPramide (REGLAN) injection, morphine injection, ondansetron **OR** ondansetron (ZOFRAN) IV, senna-docusate  Diet Orders (From admission, onward)     Start     Ordered   04/10/21 1345  Diet regular Room service appropriate? Yes; Fluid consistency: Thin  Diet effective now       Question Answer Comment  Room service appropriate? Yes   Fluid consistency: Thin      04/10/21 1344            DVT prophylaxis:  apixaban (ELIQUIS) tablet 2.5 mg Start: 04/11/21 1115 SCDs Start: 04/10/21 1342 SCDs Start: 04/08/21 2318 apixaban (ELIQUIS) tablet 2.5 mg   Lab Results  Component Value Date   PLT 182 04/12/2021      Code Status: Full Code  Family Communication: no family at bedside   Status is: Inpatient  Remains  inpatient appropriate because: needs placement  Level of care: Telemetry Medical  Consultants:  Orthopedic surgery  Procedures:  ORIF 3/2  Microbiology  none  Antimicrobials: none    Objective: Vitals:   04/13/21 1527 04/13/21 2055 04/14/21 0500 04/14/21 0849  BP: 100/60 138/61 (!) 142/64 132/68  Pulse: (!) 54 (!) 24 (!) 57   Resp: '17 17 11   '$ Temp: 98.4 F (36.9 C) 98.6 F (37 C) 98 F (36.7 C)   TempSrc: Oral Oral Oral   SpO2:  100% 98%   Weight:      Height:       No intake or output data in the 24 hours ending 04/14/21 0912  Wt Readings from Last 3 Encounters:  04/08/21 77.6 kg  10/21/20 77.8 kg  08/21/20 74.6 kg    Examination:  Constitutional: NAD Eyes: lids and conjunctivae normal, no scleral icterus ENMT: mmm Neck: normal, supple Respiratory: clear to auscultation bilaterally, no wheezing, no crackles. Normal respiratory effort.  Cardiovascular: Regular rate and rhythm, no murmurs / rubs / gallops. No LE edema. Abdomen: soft, no distention, no tenderness. Bowel sounds positive.  Skin: no rashes Neurologic: no focal deficits, equal strength   Data Reviewed: I have independently reviewed following labs and imaging studies  CBC Recent Labs  Lab 04/08/21 2136 04/10/21 0536 04/11/21 0219 04/12/21 0231  WBC 10.6* 9.8 11.5* 8.9  HGB 10.9* 11.1* 9.1* 8.2*  HCT 33.9* 35.5* 28.1* 25.6*  PLT 208 199 182 182  MCV 88.3 89.0 87.8 87.4  MCH 28.4 27.8 28.4 28.0  MCHC 32.2 31.3 32.4 32.0  RDW 14.7 14.3 14.2 14.4     Recent Labs  Lab 04/08/21 2136 04/10/21 0536 04/11/21 0219 04/12/21 0231  NA 137 133* 138 138  K 3.6 4.3 4.3 3.7  CL 103 100 103 105  CO2 25 21* 23 26  GLUCOSE 115* 117* 117* 106*  BUN 14 13 24* 30*  CREATININE 0.74 0.71 0.88 0.81  CALCIUM 9.3 9.2 9.0 8.8*  MG  --  2.4  --   --      ------------------------------------------------------------------------------------------------------------------ No results for input(s):  CHOL, HDL, LDLCALC, TRIG, CHOLHDL, LDLDIRECT in the last 72 hours.  Lab Results  Component Value Date   HGBA1C 5.6 12/01/2018   ------------------------------------------------------------------------------------------------------------------ No results for input(s): TSH, T4TOTAL, T3FREE, THYROIDAB in the last 72 hours.  Invalid input(s): FREET3  Cardiac Enzymes No results for input(s): CKMB, TROPONINI, MYOGLOBIN in the last 168 hours.  Invalid input(s): CK ------------------------------------------------------------------------------------------------------------------ No results found for: BNP  CBG: No results for input(s): GLUCAP in the last 168 hours.  Recent Results (from the past 240 hour(s))  Resp Panel by RT-PCR (Flu A&B, Covid) Nasopharyngeal Swab     Status: None   Collection Time: 04/08/21  9:37 PM   Specimen: Nasopharyngeal Swab; Nasopharyngeal(NP) swabs in vial transport medium  Result Value Ref Range Status   SARS Coronavirus 2 by RT PCR NEGATIVE NEGATIVE Final    Comment: (NOTE) SARS-CoV-2 target nucleic acids are NOT DETECTED.  The SARS-CoV-2 RNA is generally detectable in upper respiratory specimens during the acute phase of infection. The lowest concentration of SARS-CoV-2 viral copies this assay  can detect is 138 copies/mL. A negative result does not preclude SARS-Cov-2 infection and should not be used as the sole basis for treatment or other patient management decisions. A negative result may occur with  improper specimen collection/handling, submission of specimen other than nasopharyngeal swab, presence of viral mutation(s) within the areas targeted by this assay, and inadequate number of viral copies(<138 copies/mL). A negative result must be combined with clinical observations, patient history, and epidemiological information. The expected result is Negative.  Fact Sheet for Patients:  EntrepreneurPulse.com.au  Fact Sheet for  Healthcare Providers:  IncredibleEmployment.be  This test is no t yet approved or cleared by the [place] FDA and  has been authorized for detection and/or diagnosis of SARS-CoV-2 by FDA under an Emergency Use Authorization (EUA). This EUA will remain  in effect (meaning this test can be used) for the duration of the COVID-19 declaration under Section 564(b)(1) of the Act, 21 U.S.C.section 360bbb-3(b)(1), unless the authorization is terminated  or revoked sooner.       Influenza A by PCR NEGATIVE NEGATIVE Final   Influenza B by PCR NEGATIVE NEGATIVE Final    Comment: (NOTE) The Xpert Xpress SARS-CoV-2/FLU/RSV plus assay is intended as an aid in the diagnosis of influenza from Nasopharyngeal swab specimens and should not be used as a sole basis for treatment. Nasal washings and aspirates are unacceptable for Xpert Xpress SARS-CoV-2/FLU/RSV testing.  Fact Sheet for Patients: EntrepreneurPulse.com.au  Fact Sheet for Healthcare Providers: IncredibleEmployment.be  This test is not yet approved or cleared by the [place] FDA and has been authorized for detection and/or diagnosis of SARS-CoV-2 by FDA under an Emergency Use Authorization (EUA). This EUA will remain in effect (meaning this test can be used) for the duration of the COVID-19 declaration under Section 564(b)(1) of the Act, 21 U.S.C. section 360bbb-3(b)(1), unless the authorization is terminated or revoked.  Performed at [facility], [place] [address] [place], [place] [address]   Surgical PCR screen     Status: None   Collection Time: 04/10/21  8:07 AM   Specimen: Nasal Mucosa; Nasal Swab  Result Value Ref Range Status   MRSA, PCR NEGATIVE NEGATIVE Final   Staphylococcus aureus NEGATIVE NEGATIVE Final    Comment: (NOTE) The Xpert SA Assay (FDA approved for NASAL specimens in patients 29 years of age and older), is one component of a  comprehensive surveillance program. It is not intended to diagnose infection nor to guide or monitor treatment. Performed at [facility], [place] [address] [place], [place] [address]      Radiology Studies: No results found.   Marzetta Board, MD, PhD [facility]  Between 7 am - 7 pm I am available, please contact me via Amion (for emergencies) or Securechat (non urgent messages)  Between 7 pm - 7 am I am not available, please contact night coverage MD/APP via Amion

## 2021-04-14 NOTE — TOC Progression Note (Signed)
Transition of Care (TOC) - Progression Note  ? ? ?Patient Details  ?Name: Tammy [name] ?MRN: [id number] ?Date of Birth: 1955/10/15 ? ?Transition of Care (TOC) CM/SW Contact  ?Joanne Chars, LCSW ?Phone Number: ?04/14/2021, 2:44 PM ? ?Clinical Narrative:   [facility] has gone to peer to peer review, CSW spoke with pt about back up plan for SNF if [facility] is not approved.  Pt agreeable to this, choice document given, pt would like [facility] or [facility].   ? ?Referral sent out in hub for SNF. ? ? ? ?Expected Discharge Plan: [place] (SNF) ?Barriers to Discharge: Continued Medical Work up ? ?Expected Discharge Plan and Services ?Expected Discharge Plan: [place] (SNF) ?  ?Discharge Planning Services: CM Consult ?  ?Living arrangements for the past 2 months: Mobile Home ?                ?  ?  ?  ?  ?  ?  ?  ?  ?  ?  ? ? ?Social Determinants of Health (SDOH) Interventions ?  ? ?Readmission Risk Interventions ?No flowsheet data found. ? ?

## 2021-04-14 NOTE — [place] FL2 (Signed)
?[place] MEDICAID FL2 LEVEL OF CARE SCREENING TOOL  ?  ? ?IDENTIFICATION  ?Patient Name: ?Tammy [name] Birthdate: March 09, 1955 Sex: female Admission Date (Current Location): ?04/08/2021  ?[place] and [place] Number: ? [place] ?  Facility and Address:  ?[facility], [place] [address] [place], [place] [address] ?     Provider Number: ?[id number]  ?Attending Physician Name and Address:  ?Caren Griffins, MD ? Relative Name and Phone Number:  ?Curless,Loressa Daughter [phone] ?   ?Current Level of Care: ?Hospital Recommended Level of Care: ?[place] Prior Approval Number: ?  ? ?Date Approved/Denied: ?  PASRR Number: ?  ? ?Discharge Plan: ?SNF ?  ? ?Current Diagnoses: ?Patient Active Problem List  ? Diagnosis Date Noted  ? Vitamin D deficiency 04/12/2021  ? Hyponatremia 04/11/2021  ? Acute postoperative anemia due to expected blood loss 04/11/2021  ? Class 1 obesity 04/11/2021  ? Left comminuted and displaced fracture of the distal femur metaphysis 04/08/2021  ? Anemia 04/08/2021  ? History of CVA (cerebrovascular accident) 04/08/2021  ? Genetic testing 07/17/2020  ? Family history of breast cancer 07/10/2020  ? Family history of ovarian cancer 07/10/2020  ? Family history of colon cancer 07/10/2020  ? Malignant neoplasm of upper-outer quadrant of left breast in female, estrogen receptor positive ([place]) 07/09/2020  ? 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 Chronic pain syndrome 10/06/2012  ? Nicotine dependence 10/06/2012  ? Anxiety state, unspecified 10/05/2012  ? Bipolar disorder, unspecified ([place]) 10/05/2012  ? Slurred speech 10/04/2012  ? Left-sided weakness 10/04/2012  ? Hyperkalemia 10/04/2012  ? Acute CVA (cerebrovascular accident) ([place]) 10/04/2012  ? HTN (hypertension)   ? ? ?Orientation RESPIRATION BLADDER Height & Weight   ?  ?Self, Time, Situation, Place ? Normal Continent, External catheter Weight: 171 lb (77.6 kg) ?Height:  '5\' 2"'$  (157.5 cm)   ?BEHAVIORAL SYMPTOMS/MOOD NEUROLOGICAL BOWEL NUTRITION STATUS  ?    Continent Diet (see discharge summary)  ?AMBULATORY STATUS COMMUNICATION OF NEEDS Skin   ?Total Care Verbally Normal ?  ?  ?  ?    ?     ?     ? ? ?Personal Care Assistance Level of Assistance  ?Bathing, Feeding, Dressing Bathing Assistance: Limited assistance ?Feeding assistance: Independent ?Dressing Assistance: Limited assistance ?   ? ?Functional Limitations Info  ?Sight, Hearing, Speech Sight Info: Adequate ?Hearing Info: Adequate ?Speech Info: Adequate  ? ? ?SPECIAL CARE FACTORS FREQUENCY  ?PT (By licensed PT), OT (By licensed OT)   ?  ?PT Frequency: 5x week ?OT Frequency: 5x week ?  ?  ?  ?   ? ? ?Contractures Contractures Info: Not present  ? ? ?Additional Factors Info  ?Code Status, Allergies Code Status Info: full ?Allergies Info: Lisinopril ?  ?  ?  ?   ? ?Current Medications (04/14/2021):  This is the current hospital active medication list ?Current Facility-Administered Medications  ?Medication Dose Route Frequency Provider Last Rate Last Admin  ? acetaminophen (TYLENOL) tablet 500 mg  500 mg Oral Q8H Ainsley Spinner, PA-C   500 mg at 04/14/21 0848  ? amLODipine (NORVASC) tablet 10 mg  10 mg Oral Daily Ainsley Spinner, PA-C   10 mg at 04/14/21 6789  ? anastrozole (ARIMIDEX) tablet 1 mg  1 mg Oral Daily Ainsley Spinner, PA-C   1 mg at 04/14/21 0848  ? apixaban (ELIQUIS) tablet 2.5 mg  2.5 mg Oral BID Pham, Minh Q, RPH-CPP   2.5 mg at 04/14/21 0848  ?  ascorbic acid (VITAMIN C) tablet 500 mg  500 mg Oral Daily Ainsley Spinner, PA-C   500 mg at 04/14/21 0848  ? cholecalciferol (VITAMIN D3) tablet 2,000 Units  2,000 Units Oral BID Ainsley Spinner, PA-C   2,000 Units at 04/14/21 6606  ? docusate sodium (COLACE) capsule 100 mg  100 mg Oral BID Ainsley Spinner, PA-C   100 mg at 04/14/21 3016  ? gabapentin (NEURONTIN) capsule 100 mg  100 mg Oral BID Ainsley Spinner, PA-C   100 mg at 04/14/21 0109  ? hydrALAZINE (APRESOLINE) tablet 25 mg  25 mg Oral BID Ainsley Spinner, PA-C    25 mg at 04/14/21 3235  ? HYDROcodone-acetaminophen (NORCO/VICODIN) 5-325 MG per tablet 1-2 tablet  1-2 tablet Oral Q6H PRN Ainsley Spinner, PA-C   2 tablet at 04/14/21 0848  ? metoCLOPramide (REGLAN) tablet 5-10 mg  5-10 mg Oral Q8H PRN Ainsley Spinner, PA-C      ? Or  ? metoCLOPramide (REGLAN) injection 5-10 mg  5-10 mg Intravenous Q8H PRN Ainsley Spinner, PA-C      ? morphine (PF) 2 MG/ML injection 0.5 mg  0.5 mg Intravenous Q2H PRN Ainsley Spinner, PA-C   0.5 mg at 04/12/21 2140  ? mupirocin ointment (BACTROBAN) 2 % 1 application  1 application. Nasal BID Ainsley Spinner, PA-C   1 application. at 04/14/21 0849  ? ondansetron (ZOFRAN) tablet 4 mg  4 mg Oral Q6H PRN Ainsley Spinner, PA-C      ? Or  ? ondansetron (ZOFRAN) injection 4 mg  4 mg Intravenous Q6H PRN Ainsley Spinner, PA-C      ? pantoprazole (PROTONIX) EC tablet 40 mg  40 mg Oral Daily Ainsley Spinner, PA-C   40 mg at 04/14/21 0848  ? senna-docusate (Senokot-S) tablet 1 tablet  1 tablet Oral QHS PRN Ainsley Spinner, PA-C      ? Vitamin D (Ergocalciferol) (DRISDOL) capsule 50,000 Units  50,000 Units Oral Q7 days Caren Griffins, MD   50,000 Units at 04/14/21 0848  ? zinc sulfate capsule 220 mg  220 mg Oral Daily Ainsley Spinner, PA-C   220 mg at 04/14/21 0848  ? ? ? ?Discharge Medications: ?Please see discharge summary for a list of discharge medications. ? ?Relevant Imaging Results: ? ?Relevant Lab Results: ? ? ?Additional Information ?SSN: [id number].  Pt is vaccinated for covid with one booster. ? ?Joanne Chars, LCSW ? ? ? ? ?

## 2021-04-14 NOTE — Progress Notes (Signed)
Physical Therapy Treatment ?Patient Details ?Name: Tammy [name] ?MRN: [id number] ?DOB: 02-10-1955 ?Today's Date: 04/14/2021 ? ? ?History of Present Illness Pt is a 66 y.o. F who presents after a fall with left comminuted and displaced fracture of distal femur metaphysis s/p ORIF 04/10/2021. 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 ? ?  ?PT Comments  ? ? Pt received OOB in recliner with continued good motivation, participation and progress towards goals. Pt needing up to min assist to come to standing from recliner with cues needed for safe hand placement and balance. Pt with increased tolerance for gait training this session needing grossly min guard for safety and balance, adjusted RW to youth RW with good result and better stability. Good compliance throughout session with WB precautions. Pt with fair tolerance for LE exercise for increased ROM and strength. Current plan remains appropriate to address deficits and maximize functional independence and decrease caregiver burden. Pt continues to benefit from skilled PT services to progress toward functional mobility goals.  ?  ?Recommendations for follow up therapy are one component of a multi-disciplinary discharge planning process, led by the attending physician.  Recommendations may be updated based on patient status, additional functional criteria and insurance authorization. ? ?Follow Up Recommendations ? Acute inpatient rehab (3hours/day) ?  ?  ?Assistance Recommended at Discharge PRN  ?Patient can return home with the following A little help with walking and/or transfers;A little help with bathing/dressing/bathroom;Help with stairs or ramp for entrance ?  ?Equipment Recommendations ? Rolling walker (2 wheels);BSC/3in1;Wheelchair (measurements PT);Wheelchair cushion (measurements PT) (youth RW)  ?  ?Recommendations for Other Services Rehab consult ? ? ?  ?Precautions /  Restrictions Precautions ?Precautions: Fall ?Required Braces or Orthoses: Other Brace ?Other Brace: L hinged knee brace, unlocked ?Restrictions ?Weight Bearing Restrictions: Yes ?LLE Weight Bearing: Non weight bearing ?Other Position/Activity Restrictions: No LLE ROM restrictions  ?  ? ?Mobility ? Bed Mobility ?  ?  ?  ?  ?  ?  ?  ?General bed mobility comments: pt OOB in recliner on arrival ?  ? ?Transfers ?Overall transfer level: Needs assistance ?Equipment used: Rolling walker (2 wheels) ?Transfers: Sit to/from Stand, Bed to chair/wheelchair/BSC ?Sit to Stand: Min assist, Mod assist (mod assist for cues) ?  ?  ?  ?  ?  ?General transfer comment: Cues for hand placement, good compliance with WB precautions ?  ? ?Ambulation/Gait ?Ambulation/Gait assistance: Min guard ?Gait Distance (Feet): 18 Feet ?Assistive device: Rolling walker (2 wheels) (youth RW) ?Gait Pattern/deviations: Step-to pattern ?  ?  ?  ?General Gait Details: hop to gait pattern on RLE, good compliance with NWB on LLE, distance limitied by fatigue and to pt stated tolerance ? ? ?Stairs ?  ?  ?  ?  ?  ? ? ?Wheelchair Mobility ?  ? ?Modified Rankin (Stroke Patients Only) ?  ? ? ?  ?Balance Overall balance assessment: Needs assistance ?Sitting-balance support: Feet supported ?Sitting balance-Leahy Scale: Fair ?  ?  ?Standing balance support: Bilateral upper extremity supported ?Standing balance-Leahy Scale: Poor ?Standing balance comment: heavily reliant on arms ?  ?  ?  ?  ?  ?  ?  ?  ?  ?  ?  ?  ? ?  ?Cognition Arousal/Alertness: Awake/alert ?Behavior During Therapy: [facility] for tasks assessed/performed ?Overall Cognitive Status: Within Functional Limits for tasks assessed ?  ?  ?  ?  ?  ?  ?  ?  ?  ?  ?  ?  ?  ?  ?  ?  ?  ?  ?  ? ?  ?  Exercises General Exercises - Lower Extremity ?Quad Sets: AROM, Left, 10 reps, Supine ?Gluteal Sets: AROM, Both, 10 reps, Supine ?Long Arc Quad: AROM, Left, 10 reps, Seated ?Heel Slides: AAROM, Left, Supine, 10 reps ?Toe  Raises: AROM, Strengthening, Left, 10 reps, Supine (with yellow Tband) ?Heel Raises: AROM, Strengthening, Left, 10 reps, Supine (with yellow Tband) ? ?  ?General Comments General comments (skin integrity, edema, etc.): able to verbalize WB precautions and use of knee brace ?  ?  ? ?Pertinent Vitals/Pain Pain Assessment ?Pain Assessment: Faces ?Faces Pain Scale: Hurts little more ?Pain Location: LLE during flexion therex ?Pain Descriptors / Indicators: Grimacing, Operative site guarding ?Pain Intervention(s): Limited activity within patient's tolerance, Monitored during session, Repositioned  ? ? ?Home Living Family/patient expects to be discharged to:: Private residence ?Living Arrangements: Spouse/significant other ?  ?  ?  ?  ?  ?  ?  ?  ?   ?  ?Prior Function    ?  ?  ?   ? ?PT Goals (current goals can now be found in the care plan section) Acute Rehab PT Goals ?Patient Stated Goal: go to rehab ?PT Goal Formulation: With patient ?Time For Goal Achievement: 04/25/21 ?Potential to Achieve Goals: Good ? ?  ?Frequency ? ? ? Min 5X/week ? ? ? ?  ?PT Plan    ? ? ?Co-evaluation   ?  ?  ?  ?  ? ?  ?AM-PAC PT "6 Clicks" Mobility   ?Outcome Measure ? Help needed turning from your back to your side while in a flat bed without using bedrails?: A Little ?Help needed moving from lying on your back to sitting on the side of a flat bed without using bedrails?: A Little ?Help needed moving to and from a bed to a chair (including a wheelchair)?: A Little ?Help needed standing up from a chair using your arms (e.g., wheelchair or bedside chair)?: A Little ?Help needed to walk in hospital room?: A Lot (cueing) ?Help needed climbing 3-5 steps with a railing? : Total ?6 Click Score: 15 ? ?  ?End of Session Equipment Utilized During Treatment: Gait belt;Other (comment) (hinged knee brace) ?Activity Tolerance: Patient tolerated treatment well ?Patient left: in chair;with call bell/phone within reach;with family/visitor present ?Nurse  Communication: Mobility status ?PT Visit Diagnosis: Unsteadiness on feet (R26.81);Difficulty in walking, not elsewhere classified (R26.2);Pain ?Pain - Right/Left: Left ?Pain - part of body: Leg ?  ? ? ?Time: 5537-4827 ?PT Time Calculation (min) (ACUTE ONLY): 26 min ? ?Charges:  $Gait Training: 8-22 mins ?$Therapeutic Exercise: 8-22 mins          ?          ?Audry Riles. PTA ?Acute Rehabilitation Services ?Office: [phone] ? ?Betsey Holiday Jazmin Vensel ?04/14/2021, 11:47 AM ? ?

## 2021-04-14 NOTE — Progress Notes (Signed)
? ?  RE:  Tammy [name]       ?Date of Birth:  30-Jul-1955     ?Date:  04/14/21      ? ? ?To Whom It May Concern: ? ?Please be advised that the above-named patient will require a short-term nursing home stay - anticipated 30 days or less for rehabilitation and strengthening.  The plan is for return home. ? ? ?              ?MD signature ? ?              ?Date ?

## 2021-04-14 NOTE — Plan of Care (Signed)
  Problem: Activity: Goal: Risk for activity intolerance will decrease Outcome: Progressing   Problem: Coping: Goal: Level of anxiety will decrease Outcome: Progressing   Problem: Pain Managment: Goal: General experience of comfort will improve Outcome: Progressing   Problem: Safety: Goal: Ability to remain free from injury will improve Outcome: Progressing   Problem: Skin Integrity: Goal: Risk for impaired skin integrity will decrease Outcome: Progressing   

## 2021-04-14 NOTE — Progress Notes (Signed)
Inpatient Rehab Admissions Coordinator:  ? ?I do not have a bed for this pt. On CIR today. Insurance has requested a peer to peer, which I am coordinating with acute MD. Pt. States that if denied she does want to appeal. ? ?Clemens Catholic, MS, CCC-SLP ?Rehab Admissions Coordinator  ?[phone] (celll) ?[phone] (office) ? ?

## 2021-04-14 NOTE — Progress Notes (Signed)
Occupational Therapy Treatment ?Patient Details ?Name: Tammy [name] ?MRN: [id number] ?DOB: 04/07/1955 ?Today's Date: 04/14/2021 ? ? ?History of present illness Pt is a 66 y.o. F who presents after a fall with left comminuted and displaced fracture of distal femur metaphysis s/p ORIF 04/10/2021. 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 ?  ?OT comments ? Pt progressing well towards goals, requiring min guard - min A for ADLs, bed mobility, and transfers with RW. Pt adheres well to WB precautions throughout session. Increased assistance needed for static standing tasks, as pt is more reliant on external support. Pt presenting with impairments listed below, will follow acutely, pt remains a good AIR/CIR candidate at d/c.  ? ?Recommendations for follow up therapy are one component of a multi-disciplinary discharge planning process, led by the attending physician.  Recommendations may be updated based on patient status, additional functional criteria and insurance authorization. ?   ?Follow Up Recommendations ? Acute inpatient rehab (3hours/day)  ?  ?Assistance Recommended at Discharge Frequent or constant Supervision/Assistance  ?Patient can return home with the following ? A little help with walking and/or transfers;A little help with bathing/dressing/bathroom;Assistance with cooking/housework;Assist for transportation;Help with stairs or ramp for entrance ?  ?Equipment Recommendations ? BSC/3in1  ?  ?Recommendations for Other Services   ? ?  ?Precautions / Restrictions Precautions ?Precautions: Fall ?Required Braces or Orthoses: Other Brace ?Other Brace: L hinged knee brace, unlocked ?Restrictions ?Weight Bearing Restrictions: Yes ?LLE Weight Bearing: Non weight bearing ?Other Position/Activity Restrictions: No LLE ROM restrictions  ? ? ?  ? ?Mobility Bed Mobility ?Overal bed mobility: Needs Assistance ?Bed Mobility: Supine to  Sit, Sit to Supine ?  ?  ?Supine to sit: Min guard ?Sit to supine: Min guard ?  ?General bed mobility comments: pt passively lifts LLE with hinged brace ?  ? ?Transfers ?Overall transfer level: Needs assistance ?Equipment used: Rolling walker (2 wheels) ?Transfers: Sit to/from Stand ?Sit to Stand: Min guard, Min assist ?  ?  ?  ?  ?  ?General transfer comment: adheres well to WB precautions ?  ?  ?Balance Overall balance assessment: Needs assistance ?Sitting-balance support: Feet supported ?Sitting balance-Leahy Scale: Fair ?Sitting balance - Comments: can reach feet in sitting ?  ?Standing balance support: Bilateral upper extremity supported ?Standing balance-Leahy Scale: Poor ?Standing balance comment: heavy reliance on RW, unsteady in static standing ?  ?  ?  ?  ?  ?  ?  ?  ?  ?  ?  ?   ? ?ADL either performed or assessed with clinical judgement  ? ?ADL Overall ADL's : Needs assistance/impaired ?  ?  ?Grooming: Set up;Sitting ?Grooming Details (indicate cue type and reason): washes hands ?  ?  ?  ?  ?  ?  ?Lower Body Dressing: Minimal assistance;Sit to/from stand ?Lower Body Dressing Details (indicate cue type and reason): Min A to assist with clothing mgmt ?Toilet Transfer: Nature conservation officer;Ambulation;Rolling walker (2 wheels) ?  ?Toileting- Clothing Manipulation and Hygiene: Sitting/lateral lean;Minimal assistance ?Toileting - Clothing Manipulation Details (indicate cue type and reason): pericare completed on commode ?  ?  ?Functional mobility during ADLs: Min guard;Rolling walker (2 wheels) ?  ?  ? ?Extremity/Trunk Assessment Upper Extremity Assessment ?Upper Extremity Assessment: Overall WFL for tasks assessed ?  ?Lower Extremity Assessment ?Lower Extremity Assessment: Defer to PT evaluation ?  ?  ?  ? ?Vision   ?Vision Assessment?: No apparent visual  deficits ?  ?Perception Perception ?Perception: Not tested ?  ?Praxis Praxis ?Praxis: Not tested ?  ? ?Cognition Arousal/Alertness:  Awake/alert ?Behavior During Therapy: [facility] for tasks assessed/performed ?Overall Cognitive Status: Within Functional Limits for tasks assessed ?  ?  ?  ?  ?  ?  ?  ?  ?  ?  ?  ?  ?  ?  ?  ?  ?  ?  ?  ?   ?Exercises   ? ?  ?Shoulder Instructions   ? ? ?  ?General Comments HR in 130's with ambulation to bathroom, resolved once back in bed  ? ? ?Pertinent Vitals/ Pain       Pain Assessment ?Pain Assessment: Faces ?Pain Score: 2  ?Faces Pain Scale: Hurts a little bit ?Pain Location: LLE ?Pain Descriptors / Indicators: Grimacing, Operative site guarding ?Pain Intervention(s): Limited activity within patient's tolerance, Monitored during session ? ?Home Living   ?  ?  ?  ?  ?  ?  ?  ?  ?  ?  ?  ?  ?  ?  ?  ?  ?  ?  ? ?  ?Prior Functioning/Environment    ?  ?  ?  ?   ? ?Frequency ? Min 2X/week  ? ? ? ? ?  ?Progress Toward Goals ? ?OT Goals(current goals can now be found in the care plan section) ? Progress towards OT goals: Progressing toward goals ? ?Acute Rehab OT Goals ?OT Goal Formulation: With patient ?Time For Goal Achievement: 04/25/21 ?Potential to Achieve Goals: Good ?ADL Goals ?Pt Will Perform Grooming: with set-up;sitting ?Pt Will Perform Lower Body Bathing: sitting/lateral leans;with set-up ?Pt Will Perform Lower Body Dressing: sitting/lateral leans;with set-up ?Pt Will Transfer to Toilet: with modified independence;ambulating;bedside commode ?Pt Will Perform Toileting - Clothing Manipulation and hygiene: with modified independence;sitting/lateral leans ?Additional ADL Goal #1: Pt will maintain NWB on L LE during ADLs and ADL transfers.  ?Plan Discharge plan remains appropriate;Frequency remains appropriate   ? ?Co-evaluation ? ? ?   ?  ?  ?  ?  ? ?  ?AM-PAC OT "6 Clicks" Daily Activity     ?Outcome Measure ? ? Help from another person eating meals?: None ?Help from another person taking care of personal grooming?: A Little ?Help from another person toileting, which includes using toliet, bedpan, or urinal?: A  Little ?Help from another person bathing (including washing, rinsing, drying)?: A Little ?Help from another person to put on and taking off regular upper body clothing?: A Little ?Help from another person to put on and taking off regular lower body clothing?: A Little ?6 Click Score: 19 ? ?  ?End of Session Equipment Utilized During Treatment: Rolling walker (2 wheels);Left knee immobilizer ? ?OT Visit Diagnosis: Unsteadiness on feet (R26.81);Other abnormalities of gait and mobility (R26.89);Pain ?  ?Activity Tolerance Patient tolerated treatment well ?  ?Patient Left in bed;with call bell/phone within reach;with bed alarm set ?  ?Nurse Communication Mobility status ?  ? ?   ? ?Time: 2637-8588 ?OT Time Calculation (min): 20 min ? ?Charges: OT General Charges ?$OT Visit: 1 Visit ?OT Treatments ?$Self Care/Home Management : 8-22 mins ? ?Lynnda Child, OTD, OTR/L ?Acute Rehab ?[phone] ? ? ?Kaylyn Lim ?04/14/2021, 1:39 PM ?

## 2021-04-14 NOTE — Progress Notes (Signed)
Inpatient Rehab Admissions Coordinator:  ? ?Insurance denied CIR, Pt. Wishes to appeal, so I have filed for an expedited appeal. TOC also working on SNF for back up. ? ?Clemens Catholic, MS, CCC-SLP ?Rehab Admissions Coordinator  ?[phone] (celll) ?[phone] (office) ? ?

## 2021-04-15 LAB — BASIC METABOLIC PANEL
Anion gap: 10 (ref 5–15)
BUN: 16 mg/dL (ref 8–23)
CO2: 26 mmol/L (ref 22–32)
Calcium: 9.2 mg/dL (ref 8.9–10.3)
Chloride: 104 mmol/L (ref 98–111)
Creatinine, Ser: 0.71 mg/dL (ref 0.44–1.00)
GFR, Estimated: >60 mL/min (ref >60)
Glucose, Bld: 130 mg/dL — ABNORMAL HIGH (ref 70–99)
Potassium: 3.3 mmol/L — ABNORMAL LOW (ref 3.5–5.1)
Sodium: 140 mmol/L (ref 135–145)

## 2021-04-15 LAB — CBC
HCT: 25.2 % — ABNORMAL LOW (ref 36.0–46.0)
Hemoglobin: 8.2 g/dL — ABNORMAL LOW (ref 12.0–15.0)
MCH: 28.6 pg (ref 26.0–34.0)
MCHC: 32.5 g/dL (ref 30.0–36.0)
MCV: 87.8 fL (ref 80.0–100.0)
Platelets: 257 10*3/uL (ref 150–400)
RBC: 2.87 MIL/uL — ABNORMAL LOW (ref 3.87–5.11)
RDW: 14.2 % (ref 11.5–15.5)
WBC: 6.8 10*3/uL (ref 4.0–10.5)
nRBC: 0 % (ref 0.0–0.2)

## 2021-04-15 MED ORDER — POTASSIUM CHLORIDE CRYS ER 20 MEQ PO TBCR
40.0000 meq | EXTENDED_RELEASE_TABLET | Freq: Once | ORAL | Status: AC
  Administered 2021-04-15: 40 meq via ORAL
  Filled 2021-04-15: qty 2

## 2021-04-15 NOTE — Progress Notes (Signed)
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  She was found to have a left comminuted and displaced fracture of the distal femur metaphysis, orthopedic surgery was consulted and she is status post operative repair on 3/2. Awaiting placement  Subjective / 24h Interval events: She has no complaints this morning.  She appealed [employer] as she was denied for CIR and now awaiting response.  Assesement and Plan: Principal Problem:   Left comminuted and displaced fracture of the distal femur metaphysis Active Problems:   HTN (hypertension)   Bipolar disorder, unspecified (HCC)   Malignant neoplasm of upper-outer quadrant of left breast in female, estrogen receptor positive ([place])   Anemia   History of CVA (cerebrovascular accident)   Hyponatremia   Acute postoperative anemia due to expected blood loss   Class 1 obesity   Vitamin D deficiency  Assessment and Plan: Principal problem Left comminuted and displaced fracture of the distal femoral metaphysis-orthopedic surgery consulted, followed patient while hospitalized.  She was taken to the OR on 3/2 by Dr. Marcelino Scot and she is status post ORIF left distal femur with intercondylar extension.  For now orthopedic surgery recommends nonweightbearing on the operative extremity with early mobilization encouraged.  DVT prophylaxis will be with Eliquis given high risk for thromboembolic complications.  PT recommends CIR, will discuss with admission coordinators from rehab, likely needs insurance authorization.  Patient wants to  pursue CIR, insurance denied, I did a peer to peer yesterday, again denied, patient is appealing  Active problems Hyponatremia-mild, resolved.  Sodium normalized this morning.  Anemia, acute blood loss, postoperatively-monitor hemoglobin, no active bleeding.  There is a component of anemia of chronic disease in the setting of underlying malignancy.  Hemoglobin overall stable  Estrogen receptor positive breast cancer, history of lumpectomy and XRT-continue anastrozole  Hypokalemia-replete potassium today  Constipation-increase bowel regimen  Compensated liver cirrhosis, due to hep C-follows as an outpatient.  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, she would benefit from weight loss  Vitamin D deficiency-replete  Scheduled Meds:  acetaminophen  500 mg Oral Q8H   amLODipine  10 mg Oral Daily   anastrozole  1 mg Oral Daily   apixaban  2.5 mg Oral BID   vitamin C  500 mg Oral Daily   cholecalciferol  2,000 Units Oral BID   docusate sodium  100 mg Oral BID   gabapentin  100 mg Oral BID   hydrALAZINE  25 mg Oral BID   pantoprazole  40 mg Oral Daily   Vitamin D (Ergocalciferol)  50,000 Units Oral Q7 days   zinc sulfate  220 mg Oral Daily   Continuous Infusions:   PRN Meds:.metoCLOPramide **OR** metoCLOPramide (REGLAN) injection, morphine injection, ondansetron **OR** ondansetron (ZOFRAN) IV, oxyCODONE, senna-docusate  Diet Orders (From admission, onward)     Start     Ordered   04/10/21 1345  Diet regular Room service appropriate? Yes; Fluid consistency: Thin  Diet effective now       Question Answer Comment  Room service appropriate? Yes   Fluid consistency: Thin  04/10/21 1344            DVT prophylaxis: apixaban (ELIQUIS) tablet 2.5 mg Start: 04/11/21 1115 SCDs Start: 04/10/21 1342 SCDs Start: 04/08/21 2318 apixaban (ELIQUIS) tablet 2.5 mg   Lab Results  Component Value Date    PLT 257 04/15/2021      Code Status: Full Code  Family Communication: no family at bedside   Status is: Inpatient  Remains inpatient appropriate because: needs placement  Level of care: Telemetry Medical  Consultants:  Orthopedic surgery  Procedures:  ORIF 3/2  Microbiology  none  Antimicrobials: none    Objective: Vitals:   04/14/21 0849 04/14/21 1500 04/14/21 1951 04/15/21 0741  BP: 132/68  (!) 155/66 135/74  Pulse:   70 67  Resp:   15 18  Temp:  98.3 F (36.8 C) 98.6 F (37 C) 98.5 F (36.9 C)  TempSrc:  Oral Oral Oral  SpO2:   100% 100%  Weight:      Height:       No intake or output data in the 24 hours ending 04/15/21 1332  Wt Readings from Last 3 Encounters:  04/08/21 77.6 kg  10/21/20 77.8 kg  08/21/20 74.6 kg    Examination:  Constitutional: NAD Eyes: lids and conjunctivae normal, no scleral icterus ENMT: mmm Neck: normal, supple Respiratory: clear to auscultation bilaterally, no wheezing, no crackles. Normal respiratory effort.  Cardiovascular: Regular rate and rhythm, no murmurs / rubs / gallops. No LE edema. Abdomen: soft, no distention, no tenderness. Bowel sounds positive.  Skin: no rashes Neurologic: no focal deficits, equal strength   Data Reviewed: I have independently reviewed following labs and imaging studies  CBC Recent Labs  Lab 04/10/21 0536 04/11/21 0219 04/12/21 0231 04/14/21 1409 04/15/21 0302  WBC 9.8 11.5* 8.9 8.1 6.8  HGB 11.1* 9.1* 8.2* 8.7* 8.2*  HCT 35.5* 28.1* 25.6* 26.9* 25.2*  PLT 199 182 182 287 257  MCV 89.0 87.8 87.4 87.9 87.8  MCH 27.8 28.4 28.0 28.4 28.6  MCHC 31.3 32.4 32.0 32.3 32.5  RDW 14.3 14.2 14.4 14.3 14.2     Recent Labs  Lab 04/08/21 2136 04/10/21 0536 04/11/21 0219 04/12/21 0231 04/15/21 0302  NA 137 133* 138 138 140  K 3.6 4.3 4.3 3.7 3.3*  CL 103 100 103 105 104  CO2 25 21* '23 26 26  '$ GLUCOSE 115* 117* 117* 106* 130*  BUN 14 13 24* 30* 16  CREATININE 0.74 0.71 0.88 0.81  0.71  CALCIUM 9.3 9.2 9.0 8.8* 9.2  MG  --  2.4  --   --   --      ------------------------------------------------------------------------------------------------------------------ No results for input(s): CHOL, HDL, LDLCALC, TRIG, CHOLHDL, LDLDIRECT in the last 72 hours.  Lab Results  Component Value Date   HGBA1C 5.6 12/01/2018   ------------------------------------------------------------------------------------------------------------------ No results for input(s): TSH, T4TOTAL, T3FREE, THYROIDAB in the last 72 hours.  Invalid input(s): FREET3  Cardiac Enzymes No results for input(s): CKMB, TROPONINI, MYOGLOBIN in the last 168 hours.  Invalid input(s): CK ------------------------------------------------------------------------------------------------------------------ No results found for: BNP  CBG: No results for input(s): GLUCAP in the last 168 hours.  Recent Results (from the past 240 hour(s))  Resp Panel by RT-PCR (Flu A&B, Covid) Nasopharyngeal Swab     Status: None   Collection Time: 04/08/21  9:37 PM   Specimen: Nasopharyngeal Swab; Nasopharyngeal(NP) swabs in vial transport medium  Result Value Ref Range Status   SARS Coronavirus 2 by RT PCR NEGATIVE NEGATIVE Final  Comment: (NOTE) SARS-CoV-2 target nucleic acids are NOT DETECTED.  The SARS-CoV-2 RNA is generally detectable in upper respiratory specimens during the acute phase of infection. The lowest concentration of SARS-CoV-2 viral copies this assay can detect is 138 copies/mL. A negative result does not preclude SARS-Cov-2 infection and should not be used as the sole basis for treatment or other patient management decisions. A negative result may occur with  improper specimen collection/handling, submission of specimen other than nasopharyngeal swab, presence of viral mutation(s) within the areas targeted by this assay, and inadequate number of viral copies(<138 copies/mL). A negative result must be  combined with clinical observations, patient history, and epidemiological information. The expected result is Negative.  Fact Sheet for Patients:  EntrepreneurPulse.com.au  Fact Sheet for Healthcare Providers:  IncredibleEmployment.be  This test is no t yet approved or cleared by the [place] FDA and  has been authorized for detection and/or diagnosis of SARS-CoV-2 by FDA under an Emergency Use Authorization (EUA). This EUA will remain  in effect (meaning this test can be used) for the duration of the COVID-19 declaration under Section 564(b)(1) of the Act, 21 U.S.C.section 360bbb-3(b)(1), unless the authorization is terminated  or revoked sooner.       Influenza A by PCR NEGATIVE NEGATIVE Final   Influenza B by PCR NEGATIVE NEGATIVE Final    Comment: (NOTE) The Xpert Xpress SARS-CoV-2/FLU/RSV plus assay is intended as an aid in the diagnosis of influenza from Nasopharyngeal swab specimens and should not be used as a sole basis for treatment. Nasal washings and aspirates are unacceptable for Xpert Xpress SARS-CoV-2/FLU/RSV testing.  Fact Sheet for Patients: EntrepreneurPulse.com.au  Fact Sheet for Healthcare Providers: IncredibleEmployment.be  This test is not yet approved or cleared by the [place] FDA and has been authorized for detection and/or diagnosis of SARS-CoV-2 by FDA under an Emergency Use Authorization (EUA). This EUA will remain in effect (meaning this test can be used) for the duration of the COVID-19 declaration under Section 564(b)(1) of the Act, 21 U.S.C. section 360bbb-3(b)(1), unless the authorization is terminated or revoked.  Performed at [facility], [place] [address], [place], [place] [address]   Surgical PCR screen     Status: None   Collection Time: 04/10/21  8:07 AM   Specimen: Nasal Mucosa; Nasal Swab  Result Value Ref Range Status   MRSA, PCR  NEGATIVE NEGATIVE Final   Staphylococcus aureus NEGATIVE NEGATIVE Final    Comment: (NOTE) The Xpert SA Assay (FDA approved for NASAL specimens in patients 50 years of age and older), is one component of a comprehensive surveillance program. It is not intended to diagnose infection nor to guide or monitor treatment. Performed at [facility], [place] [address] [place], [place] [address]      Radiology Studies: No results found.   Marzetta Board, MD, PhD [facility]  Between 7 am - 7 pm I am available, please contact me via Amion (for emergencies) or Securechat (non urgent messages)  Between 7 pm - 7 am I am not available, please contact night coverage MD/APP via Amion

## 2021-04-15 NOTE — Progress Notes (Signed)
Physical Therapy Treatment ?Patient Details ?Name: Tammy [name] ?MRN: [id number] ?DOB: May 16, 1955 ?Today's Date: 04/15/2021 ? ? ?History of Present Illness Pt is a 66 y.o. F who presents after a fall with left comminuted and displaced fracture of distal femur metaphysis s/p ORIF 04/10/2021. 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 ? ?  ?PT Comments  ? ? Pt continuing to make steady progress towards goals with session focused on LE therex for increased ROM and strength. Pt requiring min guard grossly for ambulation in room with hop-to gait pattern on RLE with good compliance for WB precautions throughout session, distance continues to be limited by pt fatigue and increased HR, as HR up to 130's during ambulation, resolving upon sitting in recliner. Pt with good tolerance for LE exercise for increased ROM and strength. Current plan remains appropriate to address deficits and maximize functional independence and decrease caregiver burden. Pt continues to benefit from skilled PT services to progress toward functional mobility goals.  ?  ?Recommendations for follow up therapy are one component of a multi-disciplinary discharge planning process, led by the attending physician.  Recommendations may be updated based on patient status, additional functional criteria and insurance authorization. ? ?Follow Up Recommendations ? Acute inpatient rehab (3hours/day) ?  ?  ?Assistance Recommended at Discharge PRN  ?Patient can return home with the following A little help with walking and/or transfers;A little help with bathing/dressing/bathroom;Help with stairs or ramp for entrance ?  ?Equipment Recommendations ? Rolling walker (2 wheels);BSC/3in1;Wheelchair (measurements PT);Wheelchair cushion (measurements PT)  ?  ?Recommendations for Other Services Rehab consult ? ? ?  ?Precautions / Restrictions Precautions ?Precautions: Fall ?Required  Braces or Orthoses: Other Brace ?Other Brace: L hinged knee brace, unlocked ?Restrictions ?Weight Bearing Restrictions: Yes ?LLE Weight Bearing: Non weight bearing ?Other Position/Activity Restrictions: No LLE ROM restrictions  ?  ? ?Mobility ? Bed Mobility ?  ?  ?  ?  ?  ?  ?  ?General bed mobility comments: pt sitting EOB on arrival ?  ? ?Transfers ?Overall transfer level: Needs assistance ?Equipment used: Rolling walker (2 wheels) ?Transfers: Sit to/from Stand ?Sit to Stand: Min guard ?  ?  ?  ?  ?  ?General transfer comment: adheres well to WB precautions ?  ? ?Ambulation/Gait ?Ambulation/Gait assistance: Min guard ?Gait Distance (Feet): 20 Feet ?Assistive device: Rolling walker (2 wheels) ?Gait Pattern/deviations: Step-to pattern ?Gait velocity: decr ?  ?  ?General Gait Details: hop to gait pattern on RLE, good compliance with NWB on LLE, distance limitied by fatigue and to pt stated tolerance ? ? ?Stairs ?  ?  ?  ?  ?  ? ? ?Wheelchair Mobility ?  ? ?Modified Rankin (Stroke Patients Only) ?  ? ? ?  ?Balance Overall balance assessment: Needs assistance ?Sitting-balance support: Feet supported ?Sitting balance-Leahy Scale: Fair ?  ?  ?Standing balance support: Bilateral upper extremity supported ?Standing balance-Leahy Scale: Poor ?Standing balance comment: heavy reliance on RW, ?  ?  ?  ?  ?  ?  ?  ?  ?  ?  ?  ?  ? ?  ?Cognition Arousal/Alertness: Awake/alert ?Behavior During Therapy: [facility] for tasks assessed/performed ?Overall Cognitive Status: Within Functional Limits for tasks assessed ?  ?  ?  ?  ?  ?  ?  ?  ?  ?  ?  ?  ?  ?  ?  ?  ?  ?  ?  ? ?  ?  Exercises General Exercises - Lower Extremity ?Quad Sets: AROM, Left, 10 reps, Supine ?Long Arc Quad: AROM, Left, 10 reps, Seated ?Straight Leg Raises: AROM, Left, 10 reps, Supine ?Toe Raises: AROM, Strengthening, Left, 10 reps, Supine ?Heel Raises: AROM, Strengthening, Left, 10 reps, Supine ? ?  ?General Comments General comments (skin integrity, edema, etc.): HR in  130's with ambulation to window in room, resolved once sitting in recliner ?  ?  ? ?Pertinent Vitals/Pain Pain Assessment ?Pain Assessment: Faces ?Faces Pain Scale: Hurts a little bit ?Pain Location: LLE ?Pain Descriptors / Indicators: Grimacing, Operative site guarding ?Pain Intervention(s): Limited activity within patient's tolerance, Monitored during session, Repositioned  ? ? ?Home Living   ?  ?  ?  ?  ?  ?  ?  ?  ?  ?   ?  ?Prior Function    ?  ?  ?   ? ?PT Goals (current goals can now be found in the care plan section) Acute Rehab PT Goals ?PT Goal Formulation: With patient ?Time For Goal Achievement: 04/25/21 ? ?  ?Frequency ? ? ? Min 5X/week ? ? ? ?  ?PT Plan    ? ? ?Co-evaluation   ?  ?  ?  ?  ? ?  ?AM-PAC PT "6 Clicks" Mobility   ?Outcome Measure ? Help needed turning from your back to your side while in a flat bed without using bedrails?: A Little ?Help needed moving from lying on your back to sitting on the side of a flat bed without using bedrails?: A Little ?Help needed moving to and from a bed to a chair (including a wheelchair)?: A Little ?Help needed standing up from a chair using your arms (e.g., wheelchair or bedside chair)?: A Little ?Help needed to walk in hospital room?: A Lot ?Help needed climbing 3-5 steps with a railing? : Total ?6 Click Score: 15 ? ?  ?End of Session Equipment Utilized During Treatment: Gait belt;Other (comment) (hinged knee brace) ?Activity Tolerance: Patient tolerated treatment well ?Patient left: in chair;with call bell/phone within reach ?Nurse Communication: Mobility status ?PT Visit Diagnosis: Unsteadiness on feet (R26.81);Difficulty in walking, not elsewhere classified (R26.2);Pain ?Pain - Right/Left: Left ?Pain - part of body: Leg ?  ? ? ?Time: 2694-8546 ?PT Time Calculation (min) (ACUTE ONLY): 20 min ? ?Charges:  $Therapeutic Exercise: 8-22 mins          ?          ? ?Audry Riles. PTA ?Acute Rehabilitation Services ?Office: [phone] ? ? ? ?Betsey Holiday Chevon Fomby ?04/15/2021,  10:45 AM ? ?

## 2021-04-15 NOTE — TOC Progression Note (Signed)
Transition of Care (TOC) - Progression Note  ? ? ?Patient Details  ?Name: Tammy [name] ?MRN: [id number] ?Date of Birth: 1955-07-07 ? ?Transition of Care (TOC) CM/SW Contact  ?Joanne Chars, LCSW ?Phone Number: ?04/15/2021, 2:41 PM ? ?Clinical Narrative:   CIR Josem Kaufmann remains under appeal.  CSW presented bed offers to pt and she would like to choose [facility], if CIR is denied.  [facility] contacted and could accept pt if needed.  Level 2 passr docs uploaded.   ? ? ? ?Expected Discharge Plan: [facility] (SNF) ?Barriers to Discharge: Continued Medical Work up ? ?Expected Discharge Plan and Services ?Expected Discharge Plan: [place] (SNF) ?  ?Discharge Planning Services: CM Consult ?  ?Living arrangements for the past 2 months: Mobile Home ?                ?  ?  ?  ?  ?  ?  ?  ?  ?  ?  ? ? ?Social Determinants of Health (SDOH) Interventions ?  ? ?Readmission Risk Interventions ?No flowsheet data found. ? ?

## 2021-04-15 NOTE — Progress Notes (Signed)
Inpatient Rehab Admissions Coordinator:  ? ?I continue to await decision on insurance appeal for CIR. Anticipate a response on Wednesday or Thursday. Pt. Updated.  ? ?Clemens Catholic, MS, CCC-SLP ?Rehab Admissions Coordinator  ?[phone] (celll) ?[phone] (office) ? ?

## 2021-04-15 NOTE — Progress Notes (Signed)
Mobility Specialist Progress Note  ? ? 04/15/21 1404  ?Mobility  ?Activity Ambulated with assistance in room  ?Level of Assistance Contact guard assist, steadying assist  ?Assistive Device Front wheel walker  ?LLE Weight Bearing NWB  ?Distance Ambulated (ft) 60 ft ?(20+16+24)  ?Activity Response Tolerated well  ?$Mobility charge 1 Mobility  ? ?Pt received in chair and agreeable. Took x2 seated rest breaks and returned to bed with call bell in reach and bed alarm on.  ? ?Tammy [name] ?Mobility Specialist  ?M.S. 5N: [phone]  ?

## 2021-04-15 NOTE — Progress Notes (Signed)
? ?                              Orthopaedic Trauma Service Progress Note ? ?Patient ID: ?Tammy [name] ?MRN: [id number] ?DOB/AGE: 1955/11/01 66 y.o. ? ?Subjective: ? ?Doing great  ?Pain well controlled ?No complaints ? ?Has appealed CIR denial  ? ?ROS ?As above ? ?Objective:  ? ?VITALS:   ?Vitals:  ? 04/14/21 0849 04/14/21 1500 04/14/21 1951 04/15/21 0741  ?BP: 132/68  (!) 155/66 135/74  ?Pulse:   70 67  ?Resp:   15 18  ?Temp:  98.3 ?F (36.8 ?C) 98.6 ?F (37 ?C) 98.5 ?F (36.9 ?C)  ?TempSrc:  Oral Oral Oral  ?SpO2:   100% 100%  ?Weight:      ?Height:      ? ? ?Estimated body mass index is 31.28 kg/m? as calculated from the following: ?  Height as of this encounter: '5\' 2"'$  (1.575 m). ?  Weight as of this encounter: 77.6 kg. ? ? ?Intake/Output   ?   03/06 0701 ?03/07 0700 03/07 0701 ?03/08 0700  ? P.O. 240   ? Total Intake(mL/kg) 240 (3.1)   ? Net +240   ?     ? Urine Occurrence 2 x   ?  ? ?LABS ? ?Results for orders placed or performed during the hospital encounter of 04/08/21 (from the past 24 hour(s))  ?CBC     Status: Abnormal  ? Collection Time: 04/14/21  2:09 PM  ?Result Value Ref Range  ? WBC 8.1 4.0 - 10.5 K/uL  ? RBC 3.06 (L) 3.87 - 5.11 MIL/uL  ? Hemoglobin 8.7 (L) 12.0 - 15.0 g/dL  ? HCT 26.9 (L) 36.0 - 46.0 %  ? MCV 87.9 80.0 - 100.0 fL  ? MCH 28.4 26.0 - 34.0 pg  ? MCHC 32.3 30.0 - 36.0 g/dL  ? RDW 14.3 11.5 - 15.5 %  ? Platelets 287 150 - 400 K/uL  ? nRBC 0.0 0.0 - 0.2 %  ?CBC     Status: Abnormal  ? Collection Time: 04/15/21  3:02 AM  ?Result Value Ref Range  ? WBC 6.8 4.0 - 10.5 K/uL  ? RBC 2.87 (L) 3.87 - 5.11 MIL/uL  ? Hemoglobin 8.2 (L) 12.0 - 15.0 g/dL  ? HCT 25.2 (L) 36.0 - 46.0 %  ? MCV 87.8 80.0 - 100.0 fL  ? MCH 28.6 26.0 - 34.0 pg  ? MCHC 32.5 30.0 - 36.0 g/dL  ? RDW 14.2 11.5 - 15.5 %  ? Platelets 257 150 - 400 K/uL  ? nRBC 0.0 0.0 - 0.2 %  ?Basic metabolic panel     Status: Abnormal  ? Collection Time: 04/15/21  3:02 AM  ?Result Value Ref Range   ? Sodium 140 135 - 145 mmol/L  ? Potassium 3.3 (L) 3.5 - 5.1 mmol/L  ? Chloride 104 98 - 111 mmol/L  ? CO2 26 22 - 32 mmol/L  ? Glucose, Bld 130 (H) 70 - 99 mg/dL  ? BUN 16 8 - 23 mg/dL  ? Creatinine, Ser 0.71 0.44 - 1.00 mg/dL  ? Calcium 9.2 8.9 - 10.3 mg/dL  ? GFR, Estimated >60 >60 mL/min  ? Anion gap 10 5 - 15  ? ? ? ?PHYSICAL EXAM:  ? ?Gen: ambulating in room with therapy, looks good ?Lungs: unlabored  ?Cardiac: reg ?Ext:  ?     Left Lower extremity  ?  Hinged brace in place and unlocked, fitting well ?            TED hose fitting well  ?            Mepilex dressings stable ?            Ext warm  ?            + DP pulse ?            No DCT ?            Compartments are soft  ?            No pain out of proportion with passive stretch  ?            EHL, FHL, lesser toe motor intact ?            Ankle flexion, extension, inversion and eversion intact ?            Knee comes into full extension and can get her to about 60 degrees of flexion without difficulty or pain  ? ?Assessment/Plan: ?5 Days Post-Op  ? ? ? ? ?Anti-infectives (From admission, onward)  ? ? Start     Dose/Rate Route Frequency Ordered Stop  ? 04/11/21 1600  ceFAZolin (ANCEF) IVPB 1 g/50 mL premix       ? 1 g ?100 mL/hr over 30 Minutes Intravenous Every 6 hours 04/11/21 0950 04/12/21 0434  ? 04/10/21 0845  ceFAZolin (ANCEF) IVPB 2g/100 mL premix       ? 2 g ?200 mL/hr over 30 Minutes Intravenous On call to O.R. 04/10/21 0831 04/10/21 1031  ? 04/10/21 0821  ceFAZolin (ANCEF) 2-4 GM/100ML-% IVPB       ?Note to Pharmacy: Debby Bud: cabinet override  ?    04/10/21 0240 04/10/21 1032  ? ?  ?. ? ?POD/HD#: 5 ? ?66 y/o female s/p ground level fall with comminuted L distal femur fracture ?  ?-ground level fall ?  ?-comminuted L distal femur fracture s/p ORIF  ?            NWB L leg x 6-8 weeks---> walker for ambulation  ?            Unrestricted ROM L knee in hinge  ?                        Brace can be off when in bed  ?            PT/OT  evals ?            Dressing changes as needed  ?  Sutures out around 04/24/2021 ?  ?            Ice and elevate for swelling/pain control  ?  ?PT- please teach HEP for L knee ROM- AROM, PROM.  No ROM restrictions.  Quad sets, SLR, LAQ, SAQ, heel slides, stretching. ?Ankle theraband program, heel cord stretching, toe towel curls, etc ?  ?No pillows under bend of knee when at rest, ok to place under heel to help work on extension. Can also use zero knee bone foam if available ?  ?Hinged knee brace on at all times, unlocked.  Ok to work on [employer] without brace with therapist supervision.  Brace back on after ROM session if removed ?  ?- Pain management: ?            Multimodal ?  Minimize narcotics  ?  ?- ABL anemia/Hemodynamics ?            Monitor  ?  ?- Medical issues  ?            Per primary  ?  ?- DVT/PE prophylaxis: ?            eliquis x 6 weeks ?- ID:  ?            Periop abx completed  ?  ?- Metabolic Bone Disease: ?            Fracture pattern/mechanism suggestive of fragility fracture ?            Recommend DEXA in next 4-8 weeks ?            Will refer to osteoporosis clinic  ?            Vitamin d deficiency  ?  Supplement  ?  ?- Activity: ?            As above ?  ?- Impediments to fracture healing: ?            Poor bone quality/fragility fracture  ?            Chronic pain/chronic opioid  use  ?  ?- Dispo: ?            ortho issues stable  ? Follow up with OTS in 10 days  ?  Sutures out around 04/24/2021 ? ? ?Jari Pigg, PA-C ?[phone] (C) ?04/15/2021, 9:42 AM ? ?[facility] [place] [address] ?[phone] Jenetta Downer) ?[phone] (F) ? ? ? ?After 5pm and on the weekends please log on to Amion, go to orthopaedics and the look under the [facility] Call for the provider(s) on call. You can also call our office at [phone] and then follow the prompts to be connected to the call team.  ? Patient ID: Tammy [name], female   DOB: 01/07/1956,  66 y.o.   MRN: [id number] ? ?

## 2021-04-16 ENCOUNTER — Other Ambulatory Visit: Payer: Self-pay

## 2021-04-16 ENCOUNTER — Inpatient Hospital Stay (HOSPITAL_COMMUNITY)
Admission: RE | Admit: 2021-04-16 | Discharge: 2021-04-22 | DRG: 560 | Disposition: A | Discharge status: Home / Self Care | Payer: Medicare Other | Source: Intra-hospital | Attending: Physical Medicine and Rehabilitation | Admitting: Physical Medicine and Rehabilitation

## 2021-04-16 ENCOUNTER — Encounter (HOSPITAL_COMMUNITY): Payer: Self-pay | Admitting: Physical Medicine and Rehabilitation

## 2021-04-16 DIAGNOSIS — Z7901 Long term (current) use of anticoagulants: Secondary | ICD-10-CM | POA: Diagnosis not present

## 2021-04-16 DIAGNOSIS — B182 Chronic viral hepatitis C: Secondary | ICD-10-CM | POA: Diagnosis present

## 2021-04-16 DIAGNOSIS — E559 Vitamin D deficiency, unspecified: Secondary | ICD-10-CM | POA: Diagnosis present

## 2021-04-16 DIAGNOSIS — S72492D Other fracture of lower end of left femur, subsequent encounter for closed fracture with routine healing: Principal | ICD-10-CM

## 2021-04-16 DIAGNOSIS — D62 Acute posthemorrhagic anemia: Secondary | ICD-10-CM | POA: Diagnosis present

## 2021-04-16 DIAGNOSIS — S72009A Fracture of unspecified part of neck of unspecified femur, initial encounter for closed fracture: Secondary | ICD-10-CM

## 2021-04-16 DIAGNOSIS — I10 Essential (primary) hypertension: Secondary | ICD-10-CM | POA: Diagnosis present

## 2021-04-16 DIAGNOSIS — Z79899 Other long term (current) drug therapy: Secondary | ICD-10-CM

## 2021-04-16 DIAGNOSIS — S72402S Unspecified fracture of lower end of left femur, sequela: Secondary | ICD-10-CM

## 2021-04-16 DIAGNOSIS — S7292XA Unspecified fracture of left femur, initial encounter for closed fracture: Secondary | ICD-10-CM

## 2021-04-16 DIAGNOSIS — E663 Overweight: Secondary | ICD-10-CM | POA: Diagnosis present

## 2021-04-16 DIAGNOSIS — K219 Gastro-esophageal reflux disease without esophagitis: Secondary | ICD-10-CM | POA: Diagnosis present

## 2021-04-16 DIAGNOSIS — W010XXD Fall on same level from slipping, tripping and stumbling without subsequent striking against object, subsequent encounter: Secondary | ICD-10-CM | POA: Diagnosis present

## 2021-04-16 DIAGNOSIS — Z87891 Personal history of nicotine dependence: Secondary | ICD-10-CM | POA: Diagnosis not present

## 2021-04-16 DIAGNOSIS — C50912 Malignant neoplasm of unspecified site of left female breast: Secondary | ICD-10-CM | POA: Diagnosis present

## 2021-04-16 DIAGNOSIS — Z888 Allergy status to other drugs, medicaments and biological substances status: Secondary | ICD-10-CM

## 2021-04-16 DIAGNOSIS — Z803 Family history of malignant neoplasm of breast: Secondary | ICD-10-CM | POA: Diagnosis not present

## 2021-04-16 DIAGNOSIS — G8918 Other acute postprocedural pain: Secondary | ICD-10-CM | POA: Diagnosis present

## 2021-04-16 DIAGNOSIS — M1990 Unspecified osteoarthritis, unspecified site: Secondary | ICD-10-CM | POA: Diagnosis present

## 2021-04-16 DIAGNOSIS — E876 Hypokalemia: Secondary | ICD-10-CM

## 2021-04-16 DIAGNOSIS — I69354 Hemiplegia and hemiparesis following cerebral infarction affecting left non-dominant side: Secondary | ICD-10-CM | POA: Diagnosis not present

## 2021-04-16 DIAGNOSIS — Z713 Dietary counseling and surveillance: Secondary | ICD-10-CM

## 2021-04-16 DIAGNOSIS — Z6828 Body mass index (BMI) 28.0-28.9, adult: Secondary | ICD-10-CM | POA: Diagnosis not present

## 2021-04-16 DIAGNOSIS — S72009S Fracture of unspecified part of neck of unspecified femur, sequela: Secondary | ICD-10-CM

## 2021-04-16 DIAGNOSIS — Z79811 Long term (current) use of aromatase inhibitors: Secondary | ICD-10-CM

## 2021-04-16 DIAGNOSIS — Z923 Personal history of irradiation: Secondary | ICD-10-CM | POA: Diagnosis not present

## 2021-04-16 DIAGNOSIS — K7460 Unspecified cirrhosis of liver: Secondary | ICD-10-CM | POA: Diagnosis present

## 2021-04-16 DIAGNOSIS — F319 Bipolar disorder, unspecified: Secondary | ICD-10-CM | POA: Diagnosis present

## 2021-04-16 DIAGNOSIS — I69328 Other speech and language deficits following cerebral infarction: Secondary | ICD-10-CM | POA: Diagnosis not present

## 2021-04-16 MED ORDER — FLEET ENEMA 7-19 GM/118ML RE ENEM: 1.0000 | ENEMA | Freq: Once | RECTAL | Status: DC | PRN

## 2021-04-16 MED ORDER — DOCUSATE SODIUM 100 MG PO CAPS: 100.0000 mg | ORAL_CAPSULE | Freq: Two times a day (BID) | ORAL | 0 refills | Status: DC

## 2021-04-16 MED ORDER — AMLODIPINE BESYLATE 10 MG PO TABS
10.0000 mg | ORAL_TABLET | Freq: Every day | ORAL | Status: DC
  Administered 2021-04-17 – 2021-04-22 (×6): 10 mg via ORAL
  Filled 2021-04-16 (×6): qty 1

## 2021-04-16 MED ORDER — BISACODYL 10 MG RE SUPP
10.0000 mg | Freq: Every day | RECTAL | Status: DC | PRN
Start: 2021-04-16 — End: 2021-04-22

## 2021-04-16 MED ORDER — OXYCODONE HCL 5 MG PO TABS
5.0000 mg | ORAL_TABLET | Freq: Four times a day (QID) | ORAL | Status: DC | PRN
  Administered 2021-04-16 – 2021-04-19 (×11): 10 mg via ORAL
  Administered 2021-04-20: 5 mg via ORAL
  Administered 2021-04-20 – 2021-04-22 (×8): 10 mg via ORAL
  Filled 2021-04-16 (×3): qty 2
  Filled 2021-04-16: qty 1
  Filled 2021-04-16 (×4): qty 2
  Filled 2021-04-16: qty 1
  Filled 2021-04-16 (×6): qty 2
  Filled 2021-04-16: qty 1
  Filled 2021-04-16 (×5): qty 2

## 2021-04-16 MED ORDER — HYDRALAZINE HCL 25 MG PO TABS
25.0000 mg | ORAL_TABLET | Freq: Two times a day (BID) | ORAL | Status: DC
  Administered 2021-04-16 – 2021-04-22 (×12): 25 mg via ORAL
  Filled 2021-04-16 (×12): qty 1

## 2021-04-16 MED ORDER — ALUM & MAG HYDROXIDE-SIMETH 200-200-20 MG/5ML PO SUSP: 30.0000 mL | ORAL | Status: DC | PRN

## 2021-04-16 MED ORDER — ZINC SULFATE 220 (50 ZN) MG PO CAPS
220.0000 mg | ORAL_CAPSULE | Freq: Every day | ORAL | Status: DC
  Administered 2021-04-17 – 2021-04-22 (×6): 220 mg via ORAL
  Filled 2021-04-16 (×6): qty 1

## 2021-04-16 MED ORDER — VITAMIN D 25 MCG (1000 UNIT) PO TABS
2000.0000 [IU] | ORAL_TABLET | Freq: Two times a day (BID) | ORAL | Status: DC
  Administered 2021-04-16 – 2021-04-18 (×4): 2000 [IU] via ORAL
  Filled 2021-04-16 (×4): qty 2

## 2021-04-16 MED ORDER — PROCHLORPERAZINE 25 MG RE SUPP: 12.5000 mg | Freq: Four times a day (QID) | RECTAL | Status: DC | PRN

## 2021-04-16 MED ORDER — TRAZODONE HCL 50 MG PO TABS: 25.0000 mg | ORAL_TABLET | Freq: Every evening | ORAL | Status: DC | PRN

## 2021-04-16 MED ORDER — POLYETHYLENE GLYCOL 3350 17 G PO PACK: 17.0000 g | PACK | Freq: Every day | ORAL | Status: DC | PRN

## 2021-04-16 MED ORDER — DOCUSATE SODIUM 100 MG PO CAPS
100.0000 mg | ORAL_CAPSULE | Freq: Two times a day (BID) | ORAL | Status: DC
  Filled 2021-04-16 (×2): qty 1

## 2021-04-16 MED ORDER — VITAMIN D (ERGOCALCIFEROL) 1.25 MG (50000 UNIT) PO CAPS
50000.0000 [IU] | ORAL_CAPSULE | ORAL | Status: DC
  Administered 2021-04-19: 50000 [IU] via ORAL
  Filled 2021-04-16: qty 1

## 2021-04-16 MED ORDER — DIPHENHYDRAMINE HCL 12.5 MG/5ML PO ELIX: 12.5000 mg | ORAL_SOLUTION | Freq: Four times a day (QID) | ORAL | Status: DC | PRN

## 2021-04-16 MED ORDER — VITAMIN D (ERGOCALCIFEROL) 1.25 MG (50000 UNIT) PO CAPS
50000.0000 [IU] | ORAL_CAPSULE | ORAL | 0 refills | Status: DC
Start: 2021-04-19 — End: 2021-04-22

## 2021-04-16 MED ORDER — POTASSIUM CHLORIDE CRYS ER 20 MEQ PO TBCR
40.0000 meq | EXTENDED_RELEASE_TABLET | Freq: Once | ORAL | Status: AC
  Administered 2021-04-16: 40 meq via ORAL
  Filled 2021-04-16: qty 2

## 2021-04-16 MED ORDER — ACETAMINOPHEN 500 MG PO TABS
500.0000 mg | ORAL_TABLET | Freq: Three times a day (TID) | ORAL | Status: DC
Start: 2021-04-16 — End: 2021-04-18
  Administered 2021-04-16 – 2021-04-18 (×4): 500 mg via ORAL
  Filled 2021-04-16 (×4): qty 1

## 2021-04-16 MED ORDER — ANASTROZOLE 1 MG PO TABS
1.0000 mg | ORAL_TABLET | Freq: Every day | ORAL | Status: DC
  Administered 2021-04-17 – 2021-04-22 (×6): 1 mg via ORAL
  Filled 2021-04-16 (×6): qty 1

## 2021-04-16 MED ORDER — APIXABAN 2.5 MG PO TABS
2.5000 mg | ORAL_TABLET | Freq: Two times a day (BID) | ORAL | Status: DC
  Administered 2021-04-16 – 2021-04-22 (×12): 2.5 mg via ORAL
  Filled 2021-04-16 (×12): qty 1

## 2021-04-16 MED ORDER — ACETAMINOPHEN 325 MG PO TABS
325.0000 mg | ORAL_TABLET | ORAL | Status: DC | PRN
  Administered 2021-04-17 (×2): 650 mg via ORAL
  Administered 2021-04-20: 325 mg via ORAL
  Administered 2021-04-20 – 2021-04-21 (×2): 650 mg via ORAL
  Filled 2021-04-16 (×5): qty 2

## 2021-04-16 MED ORDER — GUAIFENESIN-DM 100-10 MG/5ML PO SYRP: 5.0000 mL | ORAL_SOLUTION | Freq: Four times a day (QID) | ORAL | Status: DC | PRN

## 2021-04-16 MED ORDER — ASCORBIC ACID 500 MG PO TABS
500.0000 mg | ORAL_TABLET | Freq: Every day | ORAL | Status: DC
  Administered 2021-04-17 – 2021-04-22 (×6): 500 mg via ORAL
  Filled 2021-04-16 (×6): qty 1

## 2021-04-16 MED ORDER — PROCHLORPERAZINE EDISYLATE 10 MG/2ML IJ SOLN: 5.0000 mg | Freq: Four times a day (QID) | INTRAMUSCULAR | Status: DC | PRN

## 2021-04-16 MED ORDER — GABAPENTIN 100 MG PO CAPS
100.0000 mg | ORAL_CAPSULE | Freq: Two times a day (BID) | ORAL | Status: DC
  Administered 2021-04-16 – 2021-04-22 (×12): 100 mg via ORAL
  Filled 2021-04-16 (×12): qty 1

## 2021-04-16 MED ORDER — VITAMIN D3 25 MCG PO TABS: 2000.0000 [IU] | ORAL_TABLET | Freq: Two times a day (BID) | ORAL | Status: DC

## 2021-04-16 MED ORDER — SENNOSIDES-DOCUSATE SODIUM 8.6-50 MG PO TABS: 2.0000 | ORAL_TABLET | Freq: Every evening | ORAL | Status: DC | PRN

## 2021-04-16 MED ORDER — OXYCODONE HCL 5 MG PO TABS: 5.0000 mg | ORAL_TABLET | Freq: Four times a day (QID) | ORAL | 0 refills | Status: DC | PRN

## 2021-04-16 MED ORDER — PANTOPRAZOLE SODIUM 40 MG PO TBEC
40.0000 mg | DELAYED_RELEASE_TABLET | Freq: Every day | ORAL | Status: DC
  Administered 2021-04-17: 07:00:00 40 mg via ORAL
  Filled 2021-04-16: qty 1

## 2021-04-16 MED ORDER — PROCHLORPERAZINE MALEATE 5 MG PO TABS: 5.0000 mg | ORAL_TABLET | Freq: Four times a day (QID) | ORAL | Status: DC | PRN

## 2021-04-16 MED ORDER — APIXABAN 2.5 MG PO TABS: 2.5000 mg | ORAL_TABLET | Freq: Two times a day (BID) | ORAL | 0 refills | Status: DC

## 2021-04-16 NOTE — Progress Notes (Signed)
Mobility Specialist Progress Note  ? ? 04/16/21 1250  ?Mobility  ?Activity Ambulated with assistance in room  ?Level of Assistance Contact guard assist, steadying assist  ?Assistive Device Front wheel walker  ?LLE Weight Bearing NWB  ?Distance Ambulated (ft) 50 ft  ?Activity Response Tolerated well  ?$Mobility charge 1 Mobility  ? ?Pt received in chair and agreeable. Had void in BR. No complaints. Took x1 short standing rest break. Returned to chair with call bell in reach and visitor present.  ? ?Hildred Alamin ?Mobility Specialist  ?M.S. 5N: [phone]  ?

## 2021-04-16 NOTE — Progress Notes (Signed)
Physical Therapy Treatment ?Patient Details ?Name: Tammy [name] ?MRN: [id number] ?DOB: [birth year] ?Today's Date: 04/16/2021 ? ? ?History of Present Illness Pt is a 66 y.o. F who presents after a fall with left comminuted and displaced fracture of distal femur metaphysis s/p ORIF 04/10/2021. 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 ? ?  ?PT Comments  ? ? Pt continues to make progress towards goals with good tolerance for LE therex for increased ROM and strength. Pt with good compliance with WB precautions throughout session. Pt with noted fatigue after short bout of ambulation in room but recovers well once seated. Current plan remains appropriate to address deficits and maximize functional independence and decrease caregiver burden. Pt continues to benefit from skilled PT services to progress toward functional mobility goals.  ?  ?Recommendations for follow up therapy are one component of a multi-disciplinary discharge planning process, led by the attending physician.  Recommendations may be updated based on patient status, additional functional criteria and insurance authorization. ? ?Follow Up Recommendations ? Acute inpatient rehab (3hours/day) ?  ?  ?Assistance Recommended at Discharge PRN  ?Patient can return home with the following A little help with walking and/or transfers;A little help with bathing/dressing/bathroom;Help with stairs or ramp for entrance ?  ?Equipment Recommendations ? Rolling walker (2 wheels);BSC/3in1;Wheelchair (measurements PT);Wheelchair cushion (measurements PT)  ?  ?Recommendations for Other Services Rehab consult ? ? ?  ?Precautions / Restrictions Precautions ?Precautions: Fall ?Required Braces or Orthoses: Other Brace ?Other Brace: L hinged knee brace, unlocked ?Restrictions ?Weight Bearing Restrictions: Yes ?LLE Weight Bearing: Non weight bearing ?Other Position/Activity Restrictions: No  LLE ROM restrictions  ?  ? ?Mobility ? Bed Mobility ?  ?  ?  ?  ?  ?  ?  ?General bed mobility comments: pt OOB upone arrival ?  ? ?Transfers ?Overall transfer level: Needs assistance ?Equipment used: Rolling walker (2 wheels) ?Transfers: Sit to/from Stand ?Sit to Stand: Min guard ?  ?  ?  ?  ?  ?General transfer comment: adheres well to WB precautions ?  ? ?Ambulation/Gait ?Ambulation/Gait assistance: Min guard ?Gait Distance (Feet): 20 Feet ?Assistive device: Rolling walker (2 wheels) ?Gait Pattern/deviations: Step-to pattern ?Gait velocity: decr ?  ?  ?General Gait Details: hop to gait pattern on RLE, good compliance with NWB on LLE, distance limitied by fatigue and to pt stated tolerance ? ? ?Stairs ?  ?  ?  ?  ?  ? ? ?Wheelchair Mobility ?  ? ?Modified Rankin (Stroke Patients Only) ?  ? ? ?  ?Balance Overall balance assessment: Needs assistance ?Sitting-balance support: Feet supported ?Sitting balance-Leahy Scale: Good ?Sitting balance - Comments: can reach feet in sitting ?  ?Standing balance support: Bilateral upper extremity supported ?Standing balance-Leahy Scale: Fair ?Standing balance comment: reliance on RW, able to static stand with single UE support ?  ?  ?  ?  ?  ?  ?  ?  ?  ?  ?  ?  ? ?  ?Cognition Arousal/Alertness: Awake/alert ?Behavior During Therapy: [facility] for tasks assessed/performed ?Overall Cognitive Status: Within Functional Limits for tasks assessed ?  ?  ?  ?  ?  ?  ?  ?  ?  ?  ?  ?  ?  ?  ?  ?  ?  ?  ?  ? ?  ?Exercises General Exercises - Lower Extremity ?Quad Sets: AROM, Left, 10 reps, Supine ?Long Arc Quad:  AROM, Left, 10 reps, Seated ?Heel Slides: Left, Supine, AROM, 5 reps ?Hip ABduction/ADduction: AROM, Left, 10 reps, Supine ?Straight Leg Raises: AROM, Left, 10 reps, Supine ?Toe Raises: AROM, Strengthening, Left, 10 reps, Supine ?Heel Raises: AROM, Strengthening, Left, 10 reps, Supine ? ?  ?General Comments General comments (skin integrity, edema, etc.): HR 130's with ambulation,  resolvled to 80s once seated ?  ?  ? ?Pertinent Vitals/Pain Pain Assessment ?Pain Assessment: Faces ?Faces Pain Scale: Hurts a little bit ?Pain Location: LLE ?Pain Descriptors / Indicators: Grimacing, Operative site guarding ?Pain Intervention(s): Limited activity within patient's tolerance, Monitored during session, Repositioned  ? ? ?Home Living   ?  ?  ?  ?  ?  ?  ?  ?  ?  ?   ?  ?Prior Function    ?  ?  ?   ? ?PT Goals (current goals can now be found in the care plan section) Acute Rehab PT Goals ?PT Goal Formulation: With patient ?Time For Goal Achievement: 04/25/21 ? ?  ?Frequency ? ? ? Min 5X/week ? ? ? ?  ?PT Plan    ? ? ?Co-evaluation   ?  ?  ?  ?  ? ?  ?AM-PAC PT "6 Clicks" Mobility   ?Outcome Measure ? Help needed turning from your back to your side while in a flat bed without using bedrails?: A Little ?Help needed moving from lying on your back to sitting on the side of a flat bed without using bedrails?: A Little ?Help needed moving to and from a bed to a chair (including a wheelchair)?: A Little ?Help needed standing up from a chair using your arms (e.g., wheelchair or bedside chair)?: A Little ?Help needed to walk in hospital room?: A Little ?Help needed climbing 3-5 steps with a railing? : Total ?6 Click Score: 16 ? ?  ?End of Session Equipment Utilized During Treatment: Gait belt;Other (comment) (hinged knee brace) ?Activity Tolerance: Patient tolerated treatment well ?Patient left: in chair;with call bell/phone within reach ?Nurse Communication: Mobility status ?PT Visit Diagnosis: Unsteadiness on feet (R26.81);Difficulty in walking, not elsewhere classified (R26.2);Pain ?Pain - Right/Left: Left ?Pain - part of body: Leg ?  ? ? ?Time: 8341-9622 ?PT Time Calculation (min) (ACUTE ONLY): 19 min ? ?Charges:  $Therapeutic Exercise: 8-22 mins          ?          ? ?Audry Riles. PTA ?Acute Rehabilitation Services ?Office: [phone] ? ? ? ?Betsey Holiday Yajaira Doffing ?04/16/2021, 9:52 AM ? ?

## 2021-04-16 NOTE — Assessment & Plan Note (Signed)
Started on supplementation ?

## 2021-04-16 NOTE — Assessment & Plan Note (Signed)
Continue anastrozole. 

## 2021-04-16 NOTE — Plan of Care (Signed)

## 2021-04-16 NOTE — Assessment & Plan Note (Signed)
Currently blood pressure stable.  On amlodipine, hydralazine,Also takes atenolol, hydrochlorothiazide at home ?

## 2021-04-16 NOTE — Progress Notes (Signed)
Report called to CIR.  All questions answered.   ?

## 2021-04-16 NOTE — Progress Notes (Signed)
Occupational Therapy Treatment ?Patient Details ?Name: Tammy [name] ?MRN: [id number] ?DOB: 03/07/1955 ?Today's Date: 04/16/2021 ? ? ?History of present illness Pt is a 66 y.o. F who presents after a fall with left comminuted and displaced fracture of distal femur metaphysis s/p ORIF 04/10/2021. 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 ?  ?OT comments ? Pt progressing towards goals this session, able to complete ADLs with set up -min A during session, pt min guard for bed mobility and transfers. Pt with fatigue after short distance ambulation to bathroom, however recovers quickly. Pt presenting with impairments listed below, will follow acutely. Continue to recommend AIR/CIR at d/c.  ? ?Recommendations for follow up therapy are one component of a multi-disciplinary discharge planning process, led by the attending physician.  Recommendations may be updated based on patient status, additional functional criteria and insurance authorization. ?   ?Follow Up Recommendations ? Acute inpatient rehab (3hours/day)  ?  ?Assistance Recommended at Discharge Frequent or constant Supervision/Assistance  ?Patient can return home with the following ? A little help with walking and/or transfers;A little help with bathing/dressing/bathroom;Assistance with cooking/housework;Assist for transportation;Help with stairs or ramp for entrance ?  ?Equipment Recommendations ? BSC/3in1  ?  ?Recommendations for Other Services   ? ?  ?Precautions / Restrictions Precautions ?Precautions: Fall ?Required Braces or Orthoses: Other Brace ?Other Brace: L hinged knee brace, unlocked ?Restrictions ?Weight Bearing Restrictions: Yes ?LLE Weight Bearing: Non weight bearing ?Other Position/Activity Restrictions: No LLE ROM restrictions  ? ? ?  ? ?Mobility Bed Mobility ?Overal bed mobility: Needs Assistance ?Bed Mobility: Supine to Sit, Sit to Supine ?  ?  ?Supine to  sit: Min guard ?Sit to supine: Min guard ?  ?  ?  ? ?Transfers ?Overall transfer level: Needs assistance ?Equipment used: Rolling walker (2 wheels) ?Transfers: Sit to/from Stand ?Sit to Stand: Min guard ?  ?  ?  ?  ?  ?General transfer comment: adheres well to WB precautions ?  ?  ?Balance Overall balance assessment: Needs assistance ?Sitting-balance support: Feet supported ?Sitting balance-Leahy Scale: Good ?Sitting balance - Comments: can reach feet in sitting ?  ?Standing balance support: Bilateral upper extremity supported ?Standing balance-Leahy Scale: Poor ?Standing balance comment: heavy reliance on RW, ?  ?  ?  ?  ?  ?  ?  ?  ?  ?  ?  ?   ? ?ADL either performed or assessed with clinical judgement  ? ?ADL   ?  ?  ?  ?  ?Upper Body Bathing: Set up;Sitting ?Upper Body Bathing Details (indicate cue type and reason): pt set up with bath supplies in chair ?Lower Body Bathing: Set up;Sitting/lateral leans ?Lower Body Bathing Details (indicate cue type and reason): pt set up with bath supplies in chair ?Upper Body Dressing : Set up;Sitting ?Upper Body Dressing Details (indicate cue type and reason): pt set up with gown after bathing ?Lower Body Dressing: Supervision/safety;Sitting/lateral leans ?Lower Body Dressing Details (indicate cue type and reason): dons sock sitting EOB ?Toilet Transfer: Nature conservation officer;Ambulation;Rolling walker (2 wheels) ?  ?Toileting- Clothing Manipulation and Hygiene: Sit to/from stand;Min guard ?Toileting - Clothing Manipulation Details (indicate cue type and reason): performs pericare in standing ?  ?  ?Functional mobility during ADLs: Min guard;Rolling walker (2 wheels) ?  ?  ? ?Extremity/Trunk Assessment Upper Extremity Assessment ?Upper Extremity Assessment: Overall WFL for tasks assessed ?  ?Lower Extremity Assessment ?Lower Extremity Assessment: Defer  to PT evaluation ?  ?  ?  ? ?Vision   ?Vision Assessment?: No apparent visual deficits ?  ?Perception  Perception ?Perception: Not tested ?  ?Praxis Praxis ?Praxis: Not tested ?  ? ?Cognition Arousal/Alertness: Awake/alert ?Behavior During Therapy: [facility] for tasks assessed/performed ?Overall Cognitive Status: Within Functional Limits for tasks assessed ?  ?  ?  ?  ?  ?  ?  ?  ?  ?  ?  ?  ?  ?  ?  ?  ?  ?  ?  ?   ?Exercises   ? ?  ?Shoulder Instructions   ? ? ?  ?General Comments HR in 120's with ambulation to bathroom  ? ? ?Pertinent Vitals/ Pain       Pain Assessment ?Pain Assessment: Faces ?Pain Score: 3  ?Faces Pain Scale: Hurts a little bit ?Pain Location: LLE ?Pain Descriptors / Indicators: Grimacing, Operative site guarding ?Pain Intervention(s): Limited activity within patient's tolerance, Monitored during session, Repositioned, Patient requesting pain meds-RN notified ? ?Home Living   ?  ?  ?  ?  ?  ?  ?  ?  ?  ?  ?  ?  ?  ?  ?  ?  ?  ?  ? ?  ?Prior Functioning/Environment    ?  ?  ?  ?   ? ?Frequency ? Min 2X/week  ? ? ? ? ?  ?Progress Toward Goals ? ?OT Goals(current goals can now be found in the care plan section) ? Progress towards OT goals: Progressing toward goals ? ?Acute Rehab OT Goals ?OT Goal Formulation: With patient ?Time For Goal Achievement: 04/25/21 ?Potential to Achieve Goals: Good ?ADL Goals ?Pt Will Perform Grooming: with set-up;sitting ?Pt Will Perform Lower Body Bathing: sitting/lateral leans;with set-up ?Pt Will Perform Lower Body Dressing: sitting/lateral leans;with set-up ?Pt Will Transfer to Toilet: with modified independence;ambulating;bedside commode ?Pt Will Perform Toileting - Clothing Manipulation and hygiene: with modified independence;sitting/lateral leans ?Additional ADL Goal #1: Pt will maintain NWB on L LE during ADLs and ADL transfers.  ?Plan Discharge plan remains appropriate;Frequency remains appropriate   ? ?Co-evaluation ? ? ?   ?  ?  ?  ?  ? ?  ?AM-PAC OT "6 Clicks" Daily Activity     ?Outcome Measure ? ? Help from another person eating meals?: None ?Help from another  person taking care of personal grooming?: A Little ?Help from another person toileting, which includes using toliet, bedpan, or urinal?: A Little ?Help from another person bathing (including washing, rinsing, drying)?: A Little ?Help from another person to put on and taking off regular upper body clothing?: A Little ?Help from another person to put on and taking off regular lower body clothing?: A Little ?6 Click Score: 19 ? ?  ?End of Session Equipment Utilized During Treatment: Gait belt;Rolling walker (2 wheels) ? ?OT Visit Diagnosis: Unsteadiness on feet (R26.81);Other abnormalities of gait and mobility (R26.89);Pain ?  ?Activity Tolerance Patient tolerated treatment well ?  ?Patient Left in chair;with call bell/phone within reach;with chair alarm set ?  ?Nurse Communication Mobility status ?  ? ?   ? ?Time: 7902-4097 ?OT Time Calculation (min): 19 min ? ?Charges: OT General Charges ?$OT Visit: 1 Visit ?OT Treatments ?$Self Care/Home Management : 8-22 mins ? ?Lynnda Child, OTD, OTR/L ?Acute Rehab ?[phone] ? ? ?Kaylyn Lim ?04/16/2021, 9:14 AM ?

## 2021-04-16 NOTE — Care Management Important Message (Signed)
Important Message ? ?Patient Details  ?Name: Tammy [name] ?MRN: [id number] ?Date of Birth: [birth year] ? ? ?Medicare Important Message Given:  Yes ? ? ? ? ?Levada Dy  [name] ?04/16/2021, 2:21 PM ?

## 2021-04-16 NOTE — Progress Notes (Signed)
Inpatient Rehab Admissions Coordinator:    I have a CIR bed and can admit this Pt. Today. RN may call report to [phone]  Sabirin Baray, MS, CCC-SLP Rehab Admissions Coordinator  [phone] (celll) [phone] (office)  

## 2021-04-16 NOTE — Progress Notes (Signed)
Patient arrived on unit, oriented to unit. Reviewed medications, therapy schedule, rehab routine and plan of care. States an understanding of information reviewed. No complications noted at this time. Patient is AX4 ?Tammy [name]   ?

## 2021-04-16 NOTE — PMR Pre-admission (Signed)
PMR Admission Coordinator Pre-Admission Assessment  Patient: Tammy [name] is an 66 y.o., female MRN: [id number] DOB: 14-Dec-1955 Height: _0  (157.5 cm) Weight: 77.6 kg  Insurance Information HMO: yes     PPO:      PCP:      IPA:      80/20:      OTHER:  PRIMARY: [employer] Medicare      Policy#: [id number]      Subscriber: Pt.  CM Name:       Phone#: [phone]     Fax#: [phone] Pre-Cert#: [id number]      Employer: I received notice from Lia Foyer with [employer] that appeal was overturned and CIR stay was approved 3/7-3/14 with updates due 3/13 Benefits:  Phone #:      Name:  Irene Shipper Date: 02/09/2021 - still active Deductible: $0 (does not have deductible) OOP Max: $4,500 ($84.39 met)  CIR: $325/day co-pay for days 1-5, $0/day co-pay for days 6+  SNF: $0/day copay for days 1-20, $196/day copay for days 21-43, $0/day copay for days 44-100; limited to 100 days/cal yr  Outpatient: $20/visit co-pay  Home Health:  100% coverage, 0% co-insurance; limited by medical necessity  DME: 80% coverage; 20% co-insurance  Providers: In network SECONDARY:       Policy#:      Phone#:   Development worker, community:       Phone#:   The Actuary for patients in Inpatient Rehabilitation Facilities with attached Privacy Act [place] Records was provided and verbally reviewed with: Patient  Emergency Contact Information Contact Information     Name Relation Home Work Mobile   Robers,Loressa Daughter [phone]  [phone]   [name] Daughter   [phone]   Lorrin Jackson   [phone]       Current Medical History  Patient Admitting Diagnosis: Femur fracture  History of Present Illness: Tammy [name] is a 67 y.o. 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 In the ED, left femur/knee Xray showed comminuated and displaced fracture of distal femur metaphysis. Ortho Dr. Kathaleen Bury plans to take to OR and request transfer to [facility] due to lack of OR availability at [facility].  Pt underwent ORIF of L femur 04/10/21. PT and OT were consulted and recommended CIR to assist return to PLOF.      Patient's medical record from [facility]  has been reviewed by the rehabilitation admission coordinator and physician.  Past Medical History  Past Medical History:  Diagnosis Date   Arthritis    Bipolar disorder ([place])    Breast cancer ([place])    Depression    Family history of breast cancer 07/10/2020   Family history of colon cancer 07/10/2020   Family history of ovarian cancer 07/10/2020   GERD (gastroesophageal reflux disease)    HTN (hypertension)    Stroke ([place]) 09/2012   slurred speech and left sided weakness/notes 10/04/2012    Has the patient had major surgery during 100 days prior to admission? Yes  Family History   family history includes Breast cancer in her maternal aunt and another family member; Colon cancer in an other family member; Colon cancer (age of onset: 29) in her maternal aunt; Diabetes in her brother, paternal aunt, and sister; Neuropathy in her brother and sister; Other in her mother; Ovarian cancer  in an other family member.  Current Medications  Current Facility-Administered Medications:    acetaminophen (TYLENOL) tablet 500 mg, 500 mg, Oral, Q8H, Ainsley Spinner, PA-C, 500 mg at 04/16/21 0858   amLODipine (NORVASC) tablet 10 mg, 10 mg, Oral, Daily, Ainsley Spinner, PA-C, 10 mg at 04/16/21 0857   anastrozole (ARIMIDEX) tablet 1 mg, 1 mg, Oral, Daily, Ainsley Spinner, PA-C, 1 mg at 04/16/21 0902   apixaban (ELIQUIS) tablet 2.5 mg, 2.5 mg, Oral, BID, Pham, Minh Q, RPH-CPP, 2.5 mg at 04/16/21 9163   ascorbic acid (VITAMIN C) tablet 500 mg, 500 mg, Oral, Daily, Ainsley Spinner, PA-C, 500 mg at 04/16/21 8466    cholecalciferol (VITAMIN D3) tablet 2,000 Units, 2,000 Units, Oral, BID, Ainsley Spinner, PA-C, 2,000 Units at 04/16/21 0858   docusate sodium (COLACE) capsule 100 mg, 100 mg, Oral, BID, Ainsley Spinner, PA-C, 100 mg at 04/16/21 5993   gabapentin (NEURONTIN) capsule 100 mg, 100 mg, Oral, BID, Ainsley Spinner, PA-C, 100 mg at 04/16/21 5701   hydrALAZINE (APRESOLINE) tablet 25 mg, 25 mg, Oral, BID, Ainsley Spinner, PA-C, 25 mg at 04/16/21 0858   metoCLOPramide (REGLAN) tablet 5-10 mg, 5-10 mg, Oral, Q8H PRN **OR** metoCLOPramide (REGLAN) injection 5-10 mg, 5-10 mg, Intravenous, Q8H PRN, Ainsley Spinner, PA-C   morphine (PF) 2 MG/ML injection 0.5 mg, 0.5 mg, Intravenous, Q2H PRN, Ainsley Spinner, PA-C, 0.5 mg at 04/15/21 0236   ondansetron (ZOFRAN) tablet 4 mg, 4 mg, Oral, Q6H PRN **OR** ondansetron (ZOFRAN) injection 4 mg, 4 mg, Intravenous, Q6H PRN, Ainsley Spinner, PA-C   oxyCODONE (Oxy IR/ROXICODONE) immediate release tablet 5-10 mg, 5-10 mg, Oral, Q6H PRN, Caren Griffins, MD, 10 mg at 04/16/21 0858   pantoprazole (PROTONIX) EC tablet 40 mg, 40 mg, Oral, Daily, Ainsley Spinner, PA-C, 40 mg at 04/16/21 7793   senna-docusate (Senokot-S) tablet 1 tablet, 1 tablet, Oral, QHS PRN, Ainsley Spinner, PA-C   Vitamin D (Ergocalciferol) (DRISDOL) capsule 50,000 Units, 50,000 Units, Oral, Q7 days, Caren Griffins, MD, 50,000 Units at 04/14/21 0848   zinc sulfate capsule 220 mg, 220 mg, Oral, Daily, Ainsley Spinner, PA-C, 220 mg at 04/16/21 9030  Patients Current Diet:  Diet Order             Diet - low sodium heart healthy           Diet regular Room service appropriate? Yes; Fluid consistency: Thin  Diet effective now                   Precautions / Restrictions Precautions Precautions: Fall Other Brace: L hinged knee brace, unlocked Restrictions Weight Bearing Restrictions: Yes LLE Weight Bearing: Non weight bearing Other Position/Activity Restrictions: No LLE ROM restrictions   Has the patient had 2 or more falls or a  fall with injury in the past year? Yes  Prior Activity Level Community (5-7x/wk): Pt. active in the community PTA  Prior Functional Level Self Care: Did the patient need help bathing, dressing, using the toilet or eating? Independent  Indoor Mobility: Did the patient need assistance with walking from room to room (with or without device)? Independent  Stairs: Did the patient need assistance with internal or external stairs (with or without device)? Independent  Functional Cognition: Did the patient need help planning regular tasks such as shopping or remembering to take medications? Independent  Patient Information Are you of Hispanic, Latino/a,or Spanish origin?: A. No, not of Hispanic, Latino/a, or Spanish origin What is your race?: B. Black or African American Do you  need or want an interpreter to communicate with a doctor or health care staff?: 0. No  Patient's Response To:  Health Literacy and Transportation Is the patient able to respond to health literacy and transportation needs?: Yes Health Literacy - How often do you need to have someone help you when you read instructions, pamphlets, or other written material from your doctor or pharmacy?: Never In the past 12 months, has lack of transportation kept you from medical appointments or from getting medications?: No In the past 12 months, has lack of transportation kept you from meetings, work, or from getting things needed for daily living?: No  Development worker, international aid / [place] Devices/Equipment: None Home Equipment: Shower seat  Prior Device Use: Indicate devices/aids used by the patient prior to current illness, exacerbation or injury? None of the above  Current Functional Level Cognition  Overall Cognitive Status: Within Functional Limits for tasks assessed Orientation Level: Oriented X4    Extremity Assessment (includes Sensation/Coordination)  Upper Extremity Assessment: Overall WFL for tasks  assessed  Lower Extremity Assessment: Defer to PT evaluation LLE Deficits / Details: Femur fx s/p ORIF. Grossly weak, with limited heel slide, ankle DF WFL    ADLs  Overall ADL's : Needs assistance/impaired Eating/Feeding: Independent, Sitting Grooming: Set up, Sitting Grooming Details (indicate cue type and reason): washes hands Upper Body Bathing: Set up, Sitting Upper Body Bathing Details (indicate cue type and reason): pt set up with bath supplies in chair Lower Body Bathing: Set up, Sitting/lateral leans Lower Body Bathing Details (indicate cue type and reason): pt set up with bath supplies in chair Upper Body Dressing : Set up, Sitting Upper Body Dressing Details (indicate cue type and reason): pt set up with gown after bathing Lower Body Dressing: Supervision/safety, Sitting/lateral leans Lower Body Dressing Details (indicate cue type and reason): dons sock sitting EOB Toilet Transfer: Min guard, Regular Toilet, Ambulation, Rolling walker (2 wheels) Toileting- Water quality scientist and Hygiene: Sit to/from stand, Min guard Toileting - Clothing Manipulation Details (indicate cue type and reason): performs pericare in standing Functional mobility during ADLs: Min guard, Rolling walker (2 wheels)    Mobility  Overal bed mobility: Needs Assistance Bed Mobility: Supine to Sit, Sit to Supine Supine to sit: Min guard Sit to supine: Min guard General bed mobility comments: pt OOB upone arrival    Transfers  Overall transfer level: Needs assistance Equipment used: Rolling walker (2 wheels) Transfers: Sit to/from Stand Sit to Stand: Min guard Bed to/from chair/wheelchair/BSC transfer type:: Stand pivot Stand pivot transfers: Min assist General transfer comment: adheres well to [employer] precautions    Ambulation / Gait / Stairs / Wheelchair Mobility  Ambulation/Gait Ambulation/Gait assistance: Counsellor (Feet): 20 Feet Assistive device: Rolling walker (2 wheels) Gait  Pattern/deviations: Step-to pattern General Gait Details: hop to gait pattern on RLE, good compliance with NWB on LLE, distance limitied by fatigue and to pt stated tolerance Gait velocity: decr    Posture / Balance Dynamic Sitting Balance Sitting balance - Comments: can reach feet in sitting Balance Overall balance assessment: Needs assistance Sitting-balance support: Feet supported Sitting balance-Leahy Scale: Good Sitting balance - Comments: can reach feet in sitting Standing balance support: Bilateral upper extremity supported Standing balance-Leahy Scale: Fair Standing balance comment: reliance on RW, able to static stand with single UE support    Special needs/care consideration Skin surgical incision   Previous Home Environment (from acute therapy documentation) Living Arrangements: Spouse/significant other Available Help at Discharge: Family Type of  Home: Mobile home Home Layout: One level Home Access: Stairs to enter Entrance Stairs-Rails: Right, Left Entrance Stairs-Number of Steps: 5 Bathroom Shower/Tub: Tub/shower unit Home Care Services: No  Discharge Living Setting Plans for Discharge Living Setting: Patient's home Type of Home at Discharge: Mobile home Discharge Home Layout: One level Discharge Home Access: Stairs to enter Entrance Stairs-Rails: Right, Left Entrance Stairs-Number of Steps: 5 Discharge Bathroom Shower/Tub: Tub/shower unit Discharge Bathroom Toilet: Standard Discharge Bathroom Accessibility: No Does the patient have any problems obtaining your medications?: No  Social/Family/Support Systems Patient Roles: Other (Comment) Contact Information: [phone] Anticipated Caregiver: Kollyns Mickelson Ability/Limitations of Caregiver: Family can rotate to do 24/7 Caregiver Availability: 24/7 Discharge Plan Discussed with Primary Caregiver: Yes Is Caregiver In Agreement with Plan?: Yes Does Caregiver/Family have Issues with Lodging/Transportation  while Pt is in Rehab?: No  Goals Patient/Family Goal for Rehab: PT/OT Mod I Expected length of stay: 5-7 days Pt/Family Agrees to Admission and willing to participate: Yes Program Orientation Provided & Reviewed with Pt/Caregiver Including Roles  & Responsibilities: Yes  Decrease burden of Care through IP rehab admission: Specialzed equipment needs, Decrease number of caregivers, Bowel and bladder program, and Patient/family education  Possible need for SNF placement upon discharge: not anticipated   Patient Condition: I have reviewed medical records from [facility] , spoken with CM, and patient. I met with patient at the bedside for inpatient rehabilitation assessment.  Patient will benefit from ongoing PT and OT, can actively participate in 3 hours of therapy a day 5 days of the week, and can make measurable gains during the admission.  Patient will also benefit from the coordinated team approach during an Inpatient Acute Rehabilitation admission.  The patient will receive intensive therapy as well as Rehabilitation physician, nursing, social worker, and care management interventions.  Due to safety, skin/wound care, disease management, medication administration, pain management, and patient education the patient requires 24 hour a day rehabilitation nursing.  The patient is currently min A- min guard A with mobility and basic ADLs.  Discharge setting and therapy post discharge at home with home health is anticipated.  Patient has agreed to participate in the Acute Inpatient Rehabilitation Program and will admit today.  Preadmission Screen Completed By:  Genella Mech, 04/16/2021 1:05 PM ______________________________________________________________________   Discussed status with Dr. Ranell Patrick  on 04/10/21 at 1311 and received approval for admission today.  Admission Coordinator:  Genella Mech, CCC-SLP, time 1311/Date 04/16/21   Assessment/Plan: Diagnosis: Left distal femur  fracture Does the need for close, 24 hr/day Medical supervision in concert with the patient's rehab needs make it unreasonable for this patient to be served in a less intensive setting? Yes Co-Morbidities requiring supervision/potential complications: HTN, bipolar disorder, malignant breast cancer, anemia, history of CV Due to bladder management, bowel management, safety, skin/wound care, disease management, medication administration, pain management, and patient education, does the patient require 24 hr/day rehab nursing? Yes Does the patient require coordinated care of a physician, rehab nurse, PT, OT to address physical and functional deficits in the context of the above medical diagnosis(es)? Yes Addressing deficits in the following areas: balance, endurance, locomotion, strength, transferring, bowel/bladder control, bathing, dressing, feeding, grooming, and toileting Can the patient actively participate in an intensive therapy program of at least 3 hrs of therapy 5 days a week? Yes The potential for patient to make measurable gains while on inpatient rehab is excellent Anticipated functional outcomes upon discharge from inpatient rehab: modified independent PT, modified independent OT,  modified independent SLP Estimated rehab length of stay to reach the above functional goals is: 10-12 DAYS Anticipated discharge destination: Home 10. Overall Rehab/Functional Prognosis: excellent   MD Signature: Leeroy Cha, MD

## 2021-04-16 NOTE — Assessment & Plan Note (Addendum)
orthopedic surgery consulted, followed patient while hospitalized.  She was taken to the OR on 3/2 by Dr. Marcelino Scot and she is status post ORIF left distal femur with intercondylar extension.  For now orthopedic surgery recommends nonweightbearing on the operative extremity with early mobilization encouraged.  DVT prophylaxis will be with Eliquis given high risk for thromboembolic complications.  PT recommends CIR ?

## 2021-04-16 NOTE — H&P (Signed)
Physical Medicine and Rehabilitation Admission H&P  CC: debility secondary to left femur fracture  HPI: Tammy [name] is a 66 year old female who was in her usual state of good health when she fell in a store parking lot on 04/08/2021.  She tripped on a concrete block and needed help getting up from a bystander who called EMS.  She was brought to [facility] emergency department where imaging revealed comminuted and displaced fracture of distal left femur metaphysis.  Orthopedic surgery was consulted and recommended transfer to [facility] due to lack of operating room availability at [facility]. She had no other injuries. She was on no blood thinners. Dr. Marcelino Scot was consulted and the patient was taken to the OR on 3/2 and underwent ORIF of left distal femur with intercondylar extension.  Found to be hypokalemic on follow-up blood work yesterday and this was repleted.  Anemia noted with hemoglobin of 8.2.  Iron studies pending.  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 Jaynee Eagles.  History of left breast cancer 2022 status postlumpectomy, radiation therapy on anastrozole, history of liver cirrhosis due to to chronic hepatitis C virus and is followed by [facility]. History of hypertension and bipolar disorder.  Last bowel movement was on Monday. Says she recently quit smoking. No alcohol intake. Lives with fiance in single level home with 4 steps to entry. Currently complains of chest muscle soreness.   Review of Systems  Constitutional:  Negative for chills and fever.  HENT:  Negative for congestion and sore throat.   Eyes:  Negative for blurred vision and double vision.  Respiratory:  Negative for shortness of breath.   Cardiovascular:  Negative for chest pain.  Gastrointestinal:  Negative for abdominal pain, constipation, diarrhea, nausea and vomiting.  Genitourinary:  Negative for  dysuria and urgency.  Musculoskeletal:        Anterior chest muscle soreness from using walker. Proximal left lower leg pain.  Skin:  Negative for itching and rash.   Past Medical History:  Diagnosis Date   Arthritis    Bipolar disorder ([place])    Breast cancer ([place])    Depression    Family history of breast cancer 07/10/2020   Family history of colon cancer 07/10/2020   Family history of ovarian cancer 07/10/2020   GERD (gastroesophageal reflux disease)    HTN (hypertension)    Stroke ([place]) 09/2012   slurred speech and left sided weakness/notes 10/04/2012   Past Surgical History:  Procedure Laterality Date   BREAST LUMPECTOMY WITH RADIOACTIVE SEED AND SENTINEL LYMPH NODE BIOPSY Left 07/26/2020   Procedure: LEFT BREAST LUMPECTOMY WITH RADIOACTIVE SEED AND SENTINEL LYMPH NODE BIOPSY;  Surgeon: [name], MD;  Location: [facility];  Service: General;  Laterality: Left;   [place]   fracture of leg     2 months ago   ORIF FEMUR FRACTURE Left 04/10/2021   Procedure: OPEN REDUCTION INTERNAL FIXATION (ORIF) DISTAL FEMUR FRACTURE;  Surgeon: Altamese Bradbury, MD;  Location: [facility];  Service: Orthopedics;  Laterality: Left;   Family History  Problem Relation Age of Onset   Other Mother        blood clots   Diabetes Brother    Neuropathy Brother        diabetic neuropathy   Diabetes Sister    Neuropathy Sister        diabetic neuropathy   Diabetes Paternal Aunt  Breast cancer Maternal Aunt        15   Breast cancer Other        PGM's sister; dx mid 64s   Colon cancer Maternal Aunt 69   Colon cancer Other        PGM's brother; dx 59s   Ovarian cancer Other        PGM's mother; dx unknown age   Social History:  reports that she has been smoking cigarettes. She has a 20.00 pack-year smoking history. She has never used smokeless tobacco. She reports that she does not currently use alcohol. She reports that she does not use drugs. Allergies:   Allergies  Allergen Reactions   Lisinopril Cough   Medications Prior to Admission  Medication Sig Dispense Refill   acetaminophen (TYLENOL) 500 MG tablet Take 1,000 mg by mouth every 6 (six) hours as needed for mild pain.     amLODipine (NORVASC) 10 MG tablet Take 10 mg by mouth daily.     anastrozole (ARIMIDEX) 1 MG tablet Take 1 tablet (1 mg total) by mouth daily. 90 tablet 4   [START ON 04/17/2021] apixaban (ELIQUIS) 2.5 MG TABS tablet Take 1 tablet (2.5 mg total) by mouth 2 (two) times daily. 64 tablet 0   buPROPion (WELLBUTRIN SR) 150 MG 12 hr tablet Take 150 mg by mouth 2 (two) times daily.     cholecalciferol (VITAMIN D) 25 MCG tablet Take 2 tablets (2,000 Units total) by mouth 2 (two) times daily.     docusate sodium (COLACE) 100 MG capsule Take 1 capsule (100 mg total) by mouth 2 (two) times daily. 10 capsule 0   gabapentin (NEURONTIN) 100 MG capsule Take 200 mg by mouth at bedtime.     hydrALAZINE (APRESOLINE) 25 MG tablet Take 25 mg by mouth 2 (two) times daily.     meloxicam (MOBIC) 15 MG tablet Take 15 mg by mouth daily.     Multiple Vitamin (MULTIVITAMIN PO) Take 1 tablet by mouth daily.     oxyCODONE (OXY IR/ROXICODONE) 5 MG immediate release tablet Take 1-2 tablets (5-10 mg total) by mouth every 6 (six) hours as needed for severe pain, breakthrough pain or moderate pain. 15 tablet 0   pantoprazole (PROTONIX) 40 MG tablet Take 40 mg by mouth daily.     [START ON 04/19/2021] Vitamin D, Ergocalciferol, (DRISDOL) 1.25 MG (50000 UNIT) CAPS capsule Take 1 capsule (50,000 Units total) by mouth every 7 (seven) days. 5 capsule 0   Home: Home Living Family/patient expects to be discharged to:: Private residence Living Arrangements: Spouse/significant other Available Help at Discharge: Family Type of Home: Mobile home Home Access: Stairs to enter Technical brewer of Steps: 5 Entrance Stairs-Rails: Right, Left Home Layout: One level Bathroom Shower/Tub: Tub/shower unit Home  Equipment: Industrial/product designer History: Prior Function Prior Level of Function : Independent/Modified Independent Mobility Comments: retired Designer, television/film set Status:  Mobility: Bed Mobility Overal bed mobility: Needs Assistance Bed Mobility: Supine to Sit, Sit to Supine Supine to sit: Min guard Sit to supine: Min guard General bed mobility comments: pt OOB upone arrival Transfers Overall transfer level: Needs assistance Equipment used: Rolling walker (2 wheels) Transfers: Sit to/from Stand Sit to Stand: Min guard Bed to/from chair/wheelchair/BSC transfer type:: Stand pivot Stand pivot transfers: Min assist General transfer comment: adheres well to [employer] precautions Ambulation/Gait Ambulation/Gait assistance: Min guard Gait Distance (Feet): 20 Feet Assistive device: Rolling walker (2 wheels) Gait Pattern/deviations: Step-to pattern General Gait Details: hop  to gait pattern on RLE, good compliance with NWB on LLE, distance limitied by fatigue and to pt stated tolerance Gait velocity: decr   ADL: ADL Overall ADL's : Needs assistance/impaired Eating/Feeding: Independent, Sitting Grooming: Set up, Sitting Grooming Details (indicate cue type and reason): washes hands Upper Body Bathing: Set up, Sitting Upper Body Bathing Details (indicate cue type and reason): pt set up with bath supplies in chair Lower Body Bathing: Set up, Sitting/lateral leans Lower Body Bathing Details (indicate cue type and reason): pt set up with bath supplies in chair Upper Body Dressing : Set up, Sitting Upper Body Dressing Details (indicate cue type and reason): pt set up with gown after bathing Lower Body Dressing: Supervision/safety, Sitting/lateral leans Lower Body Dressing Details (indicate cue type and reason): dons sock sitting EOB Toilet Transfer: Min guard, Regular Toilet, Ambulation, Rolling walker (2 wheels) Toileting- Water quality scientist and Hygiene: Sit to/from stand, Min  guard Toileting - Clothing Manipulation Details (indicate cue type and reason): performs pericare in standing Functional mobility during ADLs: Min guard, Rolling walker (2 wheels)   Cognition: Cognition Overall Cognitive Status: Within Functional Limits for tasks assessed Orientation Level: Oriented X4 Cognition Arousal/Alertness: Awake/alert Behavior During Therapy: WFL for tasks assessed/performed Overall Cognitive Status: Within Functional Limits for tasks assessed   Physical Exam: Blood pressure 135/70, pulse 72, temperature 98.9 F (37.2 C), temperature source Oral, resp. rate 16, height '5\' 2"'$  (1.575 m), weight 71.6 kg, SpO2 100 %. Physical Exam Constitutional:      General: She is not in acute distress.    Appearance: Normal appearance.     Comments: OOB to chair with legs elevated. BMI 28.87 HENT:     Head: Normocephalic and atraumatic.  Cardiovascular:     Rate and Rhythm: Normal rate and regular rhythm.     Comments: Systolic murmur Pulmonary:     Effort: Pulmonary effort is normal.     Breath sounds: Normal breath sounds.  Abdominal:     General: Bowel sounds are normal. There is no distension.     Palpations: Abdomen is soft.     Tenderness: There is no abdominal tenderness.  Musculoskeletal:     Comments: Left surgical incisional dressing intact; TED hose in place; LLE hinged brace in place  Skin:    General: Skin is warm and dry.  Neurological:     Mental Status: She is alert. Mental status is at baseline.   Results for orders placed or performed during the hospital encounter of 04/08/21 (from the past 48 hour(s))  CBC     Status: Abnormal   Collection Time: 04/15/21  3:02 AM  Result Value Ref Range   WBC 6.8 4.0 - 10.5 K/uL   RBC 2.87 (L) 3.87 - 5.11 MIL/uL   Hemoglobin 8.2 (L) 12.0 - 15.0 g/dL   HCT 25.2 (L) 36.0 - 46.0 %   MCV 87.8 80.0 - 100.0 fL   MCH 28.6 26.0 - 34.0 pg   MCHC 32.5 30.0 - 36.0 g/dL   RDW 14.2 11.5 - 15.5 %   Platelets 257 150 -  400 K/uL   nRBC 0.0 0.0 - 0.2 %    Comment: Performed at [facility], [place] [address] [place],  [address]  Basic metabolic panel     Status: Abnormal   Collection Time: 04/15/21  3:02 AM  Result Value Ref Range   Sodium 140 135 - 145 mmol/L   Potassium 3.3 (L) 3.5 - 5.1 mmol/L   Chloride 104 98 -  111 mmol/L   CO2 26 22 - 32 mmol/L   Glucose, Bld 130 (H) 70 - 99 mg/dL    Comment: Glucose reference range applies only to samples taken after fasting for at least 8 hours.   BUN 16 8 - 23 mg/dL   Creatinine, Ser 0.71 0.44 - 1.00 mg/dL   Calcium 9.2 8.9 - 10.3 mg/dL   GFR, Estimated >60 >60 mL/min    Comment: (NOTE) Calculated using the CKD-EPI Creatinine Equation (2021)    Anion gap 10 5 - 15    Comment: Performed at [facility] [address] [place], [place] [address]   No results found.    Blood pressure 135/70, pulse 72, temperature 98.9 F (37.2 C), temperature source Oral, resp. rate 16, height '5\' 2"'$  (1.575 m), weight 71.6 kg, SpO2 100 %.  Medical Problem List and Plan: 1. Functional deficits secondary to left femur fracture  -patient may shower but incisions must be covered  -ELOS/Goals: 10-12 days S  -Admit to CIR 2.  Antithrombotics: -DVT/anticoagulation:  Pharmaceutical: Other (comment) apixaban 2.5 mg twice daily.  Anticipated course of 6 weeks per ortho  -antiplatelet therapy: None 3. Postoperative pain: Tylenol, oxycodone as needed -- Neurontin 100 mg twice daily --Ice and elevate for swelling/pain control 4. Mood: LCSW to evaluate and provide emotional support  -antipsychotic agents: None 5. Neuropsych: This patient is capable of making decisions on her own behalf. 6. Skin/Wound Care: Routine skin care checks  -- Monitor surgical incision  -- Nylon sutures in place; remove approximately 3/16  -- Dressing changes as needed 7. Fluids/Electrolytes/Nutrition: Routine I's and O's and follow-up chemistries 8: Hypokalemia: 3.3 on 04/15/21. Repeat  tomorrow.  9: Vitamin D deficiency: Level 10.88 on 04/11/21.Continue ergocalciferol 50,000U once per week for 7 weeks.  -- Ortho recommending DEXA scan in next 4 to 8 weeks  -- Referral to osteoporosis clinic 10: Acute blood loss anemia: 11.4>>8.2. Iron panel reviewed and stable.  11: Hypertension: Continue amlodipine, hydralazine 12: Recent treatment for breast cancer: Continue Arimidex daily 13: Left femur fracture status post ORIF:  -- Hinged brace in place   -- Nonweightbearing left lower extremity x 6 to 8 weeks  -- Unrestricted range of motion left knee in hinged brace (this can be off when in bed)  --May work on range of motion without brace with therapist supervision  I have personally performed a face to face diagnostic evaluation, including, but not limited to relevant history and physical exam findings, of this patient and developed relevant assessment and plan.  Additionally, I have reviewed and concur with the physician assistant's documentation above.  Risa Grill, PA  Izora Ribas, MD 04/16/2021

## 2021-04-16 NOTE — TOC Progression Note (Signed)
Transition of Care (TOC) - Progression Note  ? ? ?Patient Details  ?Name: Tammy [name] ?MRN: [id number] ?Date of Birth: November 30, 1955 ? ?Transition of Care (TOC) CM/SW Contact  ?Joanne Chars, LCSW ?Phone Number: ?04/16/2021, 8:24 AM ? ?Clinical Narrative:   Level 2 passr received: [id number] ? ? ? ?Expected Discharge Plan: [place] (SNF) ?Barriers to Discharge: Continued Medical Work up ? ?Expected Discharge Plan and Services ?Expected Discharge Plan: [place] (SNF) ?  ?Discharge Planning Services: CM Consult ?  ?Living arrangements for the past 2 months: Mobile Home ?                ?  ?  ?  ?  ?  ?  ?  ?  ?  ?  ? ? ?Social Determinants of Health (SDOH) Interventions ?  ? ?Readmission Risk Interventions ?No flowsheet data found. ? ?

## 2021-04-16 NOTE — Progress Notes (Signed)
PMR Admission Coordinator Pre-Admission Assessment   Patient: Tammy [name] is an 66 y.o., female MRN: [id number] DOB: 01/31/1956 Height: _0  (157.5 cm) Weight: 77.6 kg   Insurance Information HMO: yes     PPO:      PCP:      IPA:      80/20:      OTHER:  PRIMARY: [employer] Medicare      Policy#: [id number]      Subscriber: Pt.  CM Name:       Phone#: [phone]     Fax#: [phone] Pre-Cert#: [id number]      Employer: I received notice from Lia Foyer with [employer] that appeal was overturned and CIR stay was approved 3/7-3/14 with updates due 3/13 Benefits:  Phone #:      Name:  Irene Shipper Date: 02/09/2021 - still active Deductible: $0 (does not have deductible) OOP Max: $4,500 ($84.39 met)  CIR: $325/day co-pay for days 1-5, $0/day co-pay for days 6+  SNF: $0/day copay for days 1-20, $196/day copay for days 21-43, $0/day copay for days 44-100; limited to 100 days/cal yr  Outpatient: $20/visit co-pay  Home Health:  100% coverage, 0% co-insurance; limited by medical necessity  DME: 80% coverage; 20% co-insurance  Providers: In network SECONDARY:       Policy#:      Phone#:    Development worker, community:       Phone#:    The Actuary for patients in Inpatient Rehabilitation Facilities with attached Privacy Act [place] Records was provided and verbally reviewed with: Patient   Emergency Contact Information Contact Information       Name Relation Home Work Mobile    Smithhart,Loressa Daughter [phone]   [phone]    [name] Daughter     [phone]    Lorrin Jackson     [phone]           Current Medical History  Patient Admitting Diagnosis: Femur fracture  History of Present Illness: Tammy [name] is a 66 y.o. 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 In the ED, left femur/knee Xray showed comminuated and displaced fracture of distal femur metaphysis. Ortho Dr. Kathaleen Bury plans to take to OR and request transfer to [facility] due to lack of OR availability at [facility].  Pt underwent ORIF of L femur 04/10/21. PT and OT were consulted and recommended CIR to assist return to PLOF.      Patient's medical record from [facility]  has been reviewed by the rehabilitation admission coordinator and physician.   Past Medical History      Past Medical History:  Diagnosis Date   Arthritis     Bipolar disorder ([place])     Breast cancer ([place])     Depression     Family history of breast cancer 07/10/2020   Family history of colon cancer 07/10/2020   Family history of ovarian cancer 07/10/2020   GERD (gastroesophageal reflux disease)     HTN (hypertension)     Stroke ([place]) 09/2012    slurred speech and left sided weakness/notes 10/04/2012      Has the patient had major surgery during 100 days prior to admission? Yes   Family History   family history includes Breast cancer in her maternal aunt and another family member; Colon cancer in  an other family member; Colon cancer (age of onset: 48) in her maternal aunt; Diabetes in her brother, paternal aunt, and sister; Neuropathy in her brother and sister; Other in her mother; Ovarian cancer in an other family member.   Current Medications   Current Facility-Administered Medications:    acetaminophen (TYLENOL) tablet 500 mg, 500 mg, Oral, Q8H, Ainsley Spinner, PA-C, 500 mg at 04/16/21 0858   amLODipine (NORVASC) tablet 10 mg, 10 mg, Oral, Daily, Ainsley Spinner, PA-C, 10 mg at 04/16/21 0857   anastrozole (ARIMIDEX) tablet 1 mg, 1 mg, Oral, Daily, Ainsley Spinner, PA-C, 1 mg at 04/16/21 0902   apixaban (ELIQUIS) tablet 2.5 mg, 2.5 mg, Oral, BID, Pham, Minh Q, RPH-CPP, 2.5 mg at 04/16/21 4765   ascorbic acid (VITAMIN C) tablet 500 mg, 500 mg, Oral, Daily, Ainsley Spinner,  PA-C, 500 mg at 04/16/21 4650   cholecalciferol (VITAMIN D3) tablet 2,000 Units, 2,000 Units, Oral, BID, Ainsley Spinner, PA-C, 2,000 Units at 04/16/21 0858   docusate sodium (COLACE) capsule 100 mg, 100 mg, Oral, BID, Ainsley Spinner, PA-C, 100 mg at 04/16/21 3546   gabapentin (NEURONTIN) capsule 100 mg, 100 mg, Oral, BID, Ainsley Spinner, PA-C, 100 mg at 04/16/21 5681   hydrALAZINE (APRESOLINE) tablet 25 mg, 25 mg, Oral, BID, Ainsley Spinner, PA-C, 25 mg at 04/16/21 0858   metoCLOPramide (REGLAN) tablet 5-10 mg, 5-10 mg, Oral, Q8H PRN **OR** metoCLOPramide (REGLAN) injection 5-10 mg, 5-10 mg, Intravenous, Q8H PRN, Ainsley Spinner, PA-C   morphine (PF) 2 MG/ML injection 0.5 mg, 0.5 mg, Intravenous, Q2H PRN, Ainsley Spinner, PA-C, 0.5 mg at 04/15/21 0236   ondansetron (ZOFRAN) tablet 4 mg, 4 mg, Oral, Q6H PRN **OR** ondansetron (ZOFRAN) injection 4 mg, 4 mg, Intravenous, Q6H PRN, Ainsley Spinner, PA-C   oxyCODONE (Oxy IR/ROXICODONE) immediate release tablet 5-10 mg, 5-10 mg, Oral, Q6H PRN, Caren Griffins, MD, 10 mg at 04/16/21 0858   pantoprazole (PROTONIX) EC tablet 40 mg, 40 mg, Oral, Daily, Ainsley Spinner, PA-C, 40 mg at 04/16/21 2751   senna-docusate (Senokot-S) tablet 1 tablet, 1 tablet, Oral, QHS PRN, Ainsley Spinner, PA-C   Vitamin D (Ergocalciferol) (DRISDOL) capsule 50,000 Units, 50,000 Units, Oral, Q7 days, Caren Griffins, MD, 50,000 Units at 04/14/21 0848   zinc sulfate capsule 220 mg, 220 mg, Oral, Daily, Ainsley Spinner, PA-C, 220 mg at 04/16/21 7001   Patients Current Diet:  Diet Order                  Diet - low sodium heart healthy             Diet regular Room service appropriate? Yes; Fluid consistency: Thin  Diet effective now                         Precautions / Restrictions Precautions Precautions: Fall Other Brace: L hinged knee brace, unlocked Restrictions Weight Bearing Restrictions: Yes LLE Weight Bearing: Non weight bearing Other Position/Activity Restrictions: No LLE ROM restrictions     Has the patient had 2 or more falls or a fall with injury in the past year? Yes   Prior Activity Level Community (5-7x/wk): Pt. active in the community PTA   Prior Functional Level Self Care: Did the patient need help bathing, dressing, using the toilet or eating? Independent   Indoor Mobility: Did the patient need assistance with walking from room to room (with or without device)? Independent   Stairs: Did the patient need assistance with internal or external stairs (  with or without device)? Independent   Functional Cognition: Did the patient need help planning regular tasks such as shopping or remembering to take medications? Independent   Patient Information Are you of Hispanic, Latino/a,or Spanish origin?: A. No, not of Hispanic, Latino/a, or Spanish origin What is your race?: B. Black or African American Do you need or want an interpreter to communicate with a doctor or health care staff?: 0. No   Patient's Response To:  Health Literacy and Transportation Is the patient able to respond to health literacy and transportation needs?: Yes Health Literacy - How often do you need to have someone help you when you read instructions, pamphlets, or other written material from your doctor or pharmacy?: Never In the past 12 months, has lack of transportation kept you from medical appointments or from getting medications?: No In the past 12 months, has lack of transportation kept you from meetings, work, or from getting things needed for daily living?: No   Development worker, international aid / [place] Devices/Equipment: None Home Equipment: Shower seat   Prior Device Use: Indicate devices/aids used by the patient prior to current illness, exacerbation or injury? None of the above   Current Functional Level Cognition   Overall Cognitive Status: Within Functional Limits for tasks assessed Orientation Level: Oriented X4    Extremity Assessment (includes Sensation/Coordination)    Upper Extremity Assessment: Overall WFL for tasks assessed  Lower Extremity Assessment: Defer to PT evaluation LLE Deficits / Details: Femur fx s/p ORIF. Grossly weak, with limited heel slide, ankle DF WFL     ADLs   Overall ADL's : Needs assistance/impaired Eating/Feeding: Independent, Sitting Grooming: Set up, Sitting Grooming Details (indicate cue type and reason): washes hands Upper Body Bathing: Set up, Sitting Upper Body Bathing Details (indicate cue type and reason): pt set up with bath supplies in chair Lower Body Bathing: Set up, Sitting/lateral leans Lower Body Bathing Details (indicate cue type and reason): pt set up with bath supplies in chair Upper Body Dressing : Set up, Sitting Upper Body Dressing Details (indicate cue type and reason): pt set up with gown after bathing Lower Body Dressing: Supervision/safety, Sitting/lateral leans Lower Body Dressing Details (indicate cue type and reason): dons sock sitting EOB Toilet Transfer: Min guard, Regular Toilet, Ambulation, Rolling walker (2 wheels) Toileting- Water quality scientist and Hygiene: Sit to/from stand, Min guard Toileting - Clothing Manipulation Details (indicate cue type and reason): performs pericare in standing Functional mobility during ADLs: Min guard, Rolling walker (2 wheels)     Mobility   Overal bed mobility: Needs Assistance Bed Mobility: Supine to Sit, Sit to Supine Supine to sit: Min guard Sit to supine: Min guard General bed mobility comments: pt OOB upone arrival     Transfers   Overall transfer level: Needs assistance Equipment used: Rolling walker (2 wheels) Transfers: Sit to/from Stand Sit to Stand: Min guard Bed to/from chair/wheelchair/BSC transfer type:: Stand pivot Stand pivot transfers: Min assist General transfer comment: adheres well to [employer] precautions     Ambulation / Gait / Stairs / Wheelchair Mobility   Ambulation/Gait Ambulation/Gait assistance: Counsellor (Feet): 20  Feet Assistive device: Rolling walker (2 wheels) Gait Pattern/deviations: Step-to pattern General Gait Details: hop to gait pattern on RLE, good compliance with NWB on LLE, distance limitied by fatigue and to pt stated tolerance Gait velocity: decr     Posture / Balance Dynamic Sitting Balance Sitting balance - Comments: can reach feet in sitting Balance Overall balance assessment: Needs  assistance Sitting-balance support: Feet supported Sitting balance-Leahy Scale: Good Sitting balance - Comments: can reach feet in sitting Standing balance support: Bilateral upper extremity supported Standing balance-Leahy Scale: Fair Standing balance comment: reliance on RW, able to static stand with single UE support     Special needs/care consideration Skin surgical incision    Previous Home Environment (from acute therapy documentation) Living Arrangements: Spouse/significant other Available Help at Discharge: Family Type of Home: Mobile home Home Layout: One level Home Access: Stairs to enter Entrance Stairs-Rails: Right, Left Entrance Stairs-Number of Steps: 5 Bathroom Shower/Tub: Tub/shower unit Home Care Services: No   Discharge Living Setting Plans for Discharge Living Setting: Patient's home Type of Home at Discharge: Mobile home Discharge Home Layout: One level Discharge Home Access: Stairs to enter Entrance Stairs-Rails: Right, Left Entrance Stairs-Number of Steps: 5 Discharge Bathroom Shower/Tub: Tub/shower unit Discharge Bathroom Toilet: Standard Discharge Bathroom Accessibility: No Does the patient have any problems obtaining your medications?: No   Social/Family/Support Systems Patient Roles: Other (Comment) Contact Information: [phone] Anticipated Caregiver: Kinzly Pierrelouis Ability/Limitations of Caregiver: Family can rotate to do 24/7 Caregiver Availability: 24/7 Discharge Plan Discussed with Primary Caregiver: Yes Is Caregiver In Agreement with Plan?:  Yes Does Caregiver/Family have Issues with Lodging/Transportation while Pt is in Rehab?: No   Goals Patient/Family Goal for Rehab: PT/OT Mod I Expected length of stay: 5-7 days Pt/Family Agrees to Admission and willing to participate: Yes Program Orientation Provided & Reviewed with Pt/Caregiver Including Roles  & Responsibilities: Yes   Decrease burden of Care through IP rehab admission: Specialzed equipment needs, Decrease number of caregivers, Bowel and bladder program, and Patient/family education   Possible need for SNF placement upon discharge: not anticipated    Patient Condition: I have reviewed medical records from [facility] , spoken with CM, and patient. I met with patient at the bedside for inpatient rehabilitation assessment.  Patient will benefit from ongoing PT and OT, can actively participate in 3 hours of therapy a day 5 days of the week, and can make measurable gains during the admission.  Patient will also benefit from the coordinated team approach during an Inpatient Acute Rehabilitation admission.  The patient will receive intensive therapy as well as Rehabilitation physician, nursing, social worker, and care management interventions.  Due to safety, skin/wound care, disease management, medication administration, pain management, and patient education the patient requires 24 hour a day rehabilitation nursing.  The patient is currently min A- min guard A with mobility and basic ADLs.  Discharge setting and therapy post discharge at home with home health is anticipated.  Patient has agreed to participate in the Acute Inpatient Rehabilitation Program and will admit today.   Preadmission Screen Completed By:  Genella Mech, 04/16/2021 1:05 PM ______________________________________________________________________   Discussed status with Dr. Ranell Patrick  on 04/10/21 at 1311 and received approval for admission today.   Admission Coordinator:  Genella Mech, CCC-SLP, time  1311/Date 04/16/21    Assessment/Plan: Diagnosis: Left distal femur fracture Does the need for close, 24 hr/day Medical supervision in concert with the patient's rehab needs make it unreasonable for this patient to be served in a less intensive setting? Yes Co-Morbidities requiring supervision/potential complications: HTN, bipolar disorder, malignant breast cancer, anemia, history of CV Due to bladder management, bowel management, safety, skin/wound care, disease management, medication administration, pain management, and patient education, does the patient require 24 hr/day rehab nursing? Yes Does the patient require coordinated care of a physician, rehab nurse, PT, OT to  address physical and functional deficits in the context of the above medical diagnosis(es)? Yes Addressing deficits in the following areas: balance, endurance, locomotion, strength, transferring, bowel/bladder control, bathing, dressing, feeding, grooming, and toileting Can the patient actively participate in an intensive therapy program of at least 3 hrs of therapy 5 days a week? Yes The potential for patient to make measurable gains while on inpatient rehab is excellent Anticipated functional outcomes upon discharge from inpatient rehab: modified independent PT, modified independent OT, modified independent SLP Estimated rehab length of stay to reach the above functional goals is: 10-12 DAYS Anticipated discharge destination: Home 10. Overall Rehab/Functional Prognosis: excellent     MD Signature: Leeroy Cha, MD

## 2021-04-16 NOTE — Progress Notes (Signed)
Patient ID: Tammy [name], female   DOB: Oct 16, 1955, 66 y.o.   MRN: [id number] ? ?Met with patient in room, introduced myself, and explained my role in her care. Assisted nurse with 2-person skin assessment. Explained to patient the rehab process, therapy schedule, team conferences, and additional educational materials added to her [employer]. This RN explained that the notebook is hers and to take home at discharge. Patient verbalized understanding. Will continue to monitor patient's progress while on CIR. ? ?Dorthula Nettles, RN3, BSN, CBIS, CRRN, WTA ?Care Coordinator, Inpatient Rehabilitation ?Office [phone] ? ?

## 2021-04-16 NOTE — Discharge Summary (Addendum)
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  She was found to have a left comminuted and displaced fracture of the distal femur metaphysis, orthopedic surgery was consulted and she is status post operative repair on 3/2. PT/OT recommended CIR, medically stable for discharge.  Following problems were addressed during her hospitalization:  Left comminuted and displaced fracture of the distal femur metaphysis Orthopedic surgery consulted, followed patient while hospitalized.  She was taken to the OR on 3/2 by Dr. Marcelino Scot and she is status post ORIF left distal femur with intercondylar extension.   DVT prophylaxis will be with Eliquis given high risk for thromboembolic complications.  PT recommended CIR   Hypokalemia Supplemented.   Vitamin D deficiency Started on supplementation.  Check vitamin D level in 4 to 6 weeks   Acute postoperative anemia due to expected blood loss Currently hemoglobin stable.  Do a CBC test in a week   History of CVA (cerebrovascular accident) Has mild left-sided residual weakness.  Follows with neurology   Malignant neoplasm of upper-outer quadrant of left breast in female, estrogen receptor positive ([place]) Continue anastrozole   HTN (hypertension) Currently blood pressure stable.  On amlodipine, hydralazine.Also takes atenolol, hydrochlorothiazide at home, this medicine will be  discontinued because her blood pressure is stable on amlodipine and hydralazine    Discharge Diagnoses:  Principal Problem:   Left comminuted and displaced fracture of the distal femur metaphysis Active Problems:   HTN (hypertension)   Bipolar disorder, unspecified ([place])   Malignant neoplasm of upper-outer quadrant of left breast in female, estrogen receptor positive ([place])   Anemia   History of CVA (cerebrovascular accident)   Acute postoperative anemia due to expected blood loss   Class 1 obesity   Vitamin D deficiency   Hypokalemia    Discharge Instructions  Discharge Instructions     Diet - low sodium heart healthy   Complete by: As directed    Discharge instructions   Complete by: As directed    1)Please take prescribed medications as instructed 2)Follow up with orthopedics as an outpatient.  Name and number the provider has been attached   Increase activity slowly   Complete by: As directed    No wound care   Complete by: As directed       Allergies as of 04/16/2021       Reactions   Lisinopril Cough        Medication List     STOP taking these medications    atenolol 25 MG tablet Commonly known as: TENORMIN   hydrochlorothiazide 25 MG tablet Commonly known as: HYDRODIURIL   HYDROcodone-acetaminophen 5-325 MG tablet Commonly known as: NORCO/VICODIN       TAKE these medications    acetaminophen 500 MG tablet Commonly known as: TYLENOL Take 1,000 mg by mouth every 6 (six) hours as needed for mild pain.   amLODipine 10 MG tablet Commonly known as: NORVASC Take 10 mg by mouth daily.   anastrozole 1 MG tablet Commonly known as: ARIMIDEX Take 1 tablet (  1 mg total) by mouth daily.   apixaban 2.5 MG Tabs tablet Commonly known as: ELIQUIS Take 1 tablet (2.5 mg total) by mouth 2 (two) times daily. Start taking on: April 17, 2021   buPROPion 150 MG 12 hr tablet Commonly known as: WELLBUTRIN SR Take 150 mg by mouth 2 (two) times daily.    docusate sodium 100 MG capsule Commonly known as: COLACE Take 1 capsule (100 mg total) by mouth 2 (two) times daily.   gabapentin 100 MG capsule Commonly known as: NEURONTIN Take 200 mg by mouth at bedtime. What changed: Another medication with the same name was removed. Continue taking this medication, and follow the directions you see here.   hydrALAZINE 25 MG tablet Commonly known as: APRESOLINE Take 25 mg by mouth 2 (two) times daily.   meloxicam 15 MG tablet Commonly known as: MOBIC Take 15 mg by mouth daily.   MULTIVITAMIN PO Take 1 tablet by mouth daily.   oxyCODONE 5 MG immediate release tablet Commonly known as: Oxy IR/ROXICODONE Take 1-2 tablets (5-10 mg total) by mouth every 6 (six) hours as needed for severe pain, breakthrough pain or moderate pain.   pantoprazole 40 MG tablet Commonly known as: PROTONIX Take 40 mg by mouth daily.   Vitamin D (Ergocalciferol) 1.25 MG (50000 UNIT) Caps capsule Commonly known as: DRISDOL Take 1 capsule (50,000 Units total) by mouth every 7 (seven) days. Start taking on: April 19, 2021   Vitamin D3 25 MCG tablet Commonly known as: Vitamin D Take 2 tablets (2,000 Units total) by mouth 2 (two) times daily.        Follow-up Information     [name], MD Follow up.   Specialty: Neurology Contact information: [address] [place] [address] [phone]         [name], MD Follow up in 1 week(s).   Specialty: Internal Medicine Why: hospital discharge follow up Contact information: [address] [place] [address] [phone]         Altamese McHenry, MD. Schedule an appointment as soon as possible for a visit in 10 day(s).   Specialty: Orthopedic Surgery Contact information: [place] [address] [phone]                Allergies  Allergen Reactions   Lisinopril Cough    Consultations: Orthopedics   Procedures/Studies: DG Knee 1-2 Views  Right  Result Date: 04/10/2021 CLINICAL DATA:  Elective surgery EXAM: RIGHT KNEE - 1-2 VIEW COMPARISON:  None FINDINGS: Single AP view of the RIGHT knee was obtained intraoperatively. Osseous demineralization with mild degenerative changes. No fracture dislocation. IMPRESSION: Osseous demineralization RIGHT knee with mild scattered degenerative changes. Electronically Signed   By: Lavonia Dana M.D.   On: 04/10/2021 13:18   CT Knee Left Wo Contrast  Result Date: 04/08/2021 CLINICAL DATA:  Fall, left knee fracture EXAM: CT OF THE LEFT KNEE WITHOUT CONTRAST TECHNIQUE: Multidetector CT imaging of the left knee was performed according to the standard protocol. Multiplanar CT image reconstructions were also generated. RADIATION DOSE REDUCTION: This exam was performed according to the departmental dose-optimization program which includes automated exposure control, adjustment of the mA and/or kV according to patient size and/or use of iterative reconstruction technique. COMPARISON:  None. FINDINGS: Bones/Joint/Cartilage There is a markedly comminuted fracture of the distal femur identified. The dominant fracture plane extends transversely through the distal metadiaphysis and there is 1 shaft with posterior displacement, override and marked  posterior angulation of the distal fracture fragments. Additionally, there is a major fracture plane that extends longitudinally into the intercondylar notch of the distal femur resulting in separation of the femoral condyles which appear minimally displaced with resultant gap within the intercondylar notch. Fracture fragments appear aligned, however. Medial and lateral compartment joint spaces appear aligned. Patella, visualized tibia, and visualized fibula are intact. Several small intra-articular loose bodies are present measuring up to 9 mm. Superimposed mild to moderate tricompartmental degenerative arthritis of the knee with joint space narrowing and osteophyte formation, most  severe within the medial compartment. Ligaments Suboptimally assessed by CT. Muscles and Tendons Unremarkable Soft tissues Large lipohemarthrosis is present. IMPRESSION: Markedly comminuted fracture of the distal right femur demonstrating both transverse and longitudinal dominant fracture planes with 1 shaft with posterior displacement, override, and posterior angulation of the distal fracture fragment consisting of the femoral condyles. Electronically Signed   By: Fidela Salisbury M.D.   On: 04/08/2021 22:04   DG Knee Complete 4 Views Left  Result Date: 04/08/2021 CLINICAL DATA:  Fall. EXAM: LEFT FEMUR 2 VIEWS; LEFT KNEE - COMPLETE 4+ VIEW COMPARISON:  None. FINDINGS: There is a comminuted and displaced fracture of the distal femoral metaphysis. There is posterior displacement of the distal fracture fragment in relation to the femoral shaft and approximately 3.5 cm overlap. There is impaction of the distal end of the proximal fracture fragment on the anterior aspect of the distal fracture fragment and on the patella. The bones are osteopenic. No dislocation. There is moderate arthritic changes of the left knee with tricompartmental narrowing. The soft tissue swelling of the knee. No radiopaque foreign object or soft tissue gas. IMPRESSION: 1. Comminuted and displaced fracture of the distal femoral metaphysis. No dislocation. 2. Osteopenia. 3. Moderate arthritic changes of the left knee. Electronically Signed   By: Anner Crete M.D.   On: 04/08/2021 20:10   DG C-Arm 1-60 Min-No Report  Result Date: 04/10/2021 Fluoroscopy was utilized by the requesting physician.  No radiographic interpretation.   DG C-Arm 1-60 Min-No Report  Result Date: 04/10/2021 Fluoroscopy was utilized by the requesting physician.  No radiographic interpretation.   DG FEMUR MIN 2 VIEWS LEFT  Result Date: 04/10/2021 CLINICAL DATA:  ORIF distal LEFT femur fracture EXAM: LEFT FEMUR 2 VIEWS COMPARISON:  04/08/2021 FLUOROSCOPY TIME: 1  minute 26.4 seconds Dose: 6.82 mGy Images: 9 FINDINGS: Osseous demineralization. Comminuted distal LEFT femoral metadiaphyseal fracture again seen. Images demonstrate sequential placement of a lateral plate and multiple screws across the distal femoral fracture. Knee joint alignment normal. Patellofemoral degenerative changes. IMPRESSION: Post ORIF of comminuted distal LEFT femoral metadiaphyseal fracture. Electronically Signed   By: Lavonia Dana M.D.   On: 04/10/2021 13:17   DG Femur Min 2 Views Left  Result Date: 04/08/2021 CLINICAL DATA:  Fall. EXAM: LEFT FEMUR 2 VIEWS; LEFT KNEE - COMPLETE 4+ VIEW COMPARISON:  None. FINDINGS: There is a comminuted and displaced fracture of the distal femoral metaphysis. There is posterior displacement of the distal fracture fragment in relation to the femoral shaft and approximately 3.5 cm overlap. There is impaction of the distal end of the proximal fracture fragment on the anterior aspect of the distal fracture fragment and on the patella. The bones are osteopenic. No dislocation. There is moderate arthritic changes of the left knee with tricompartmental narrowing. The soft tissue swelling of the knee. No radiopaque foreign object or soft tissue gas. IMPRESSION: 1. Comminuted and displaced fracture of the distal femoral metaphysis. No dislocation.  2. Osteopenia. 3. Moderate arthritic changes of the left knee. Electronically Signed   By: Anner Crete M.D.   On: 04/08/2021 20:10   DG FEMUR PORT MIN 2 VIEWS LEFT  Result Date: 04/10/2021 CLINICAL DATA:  Status post LEFT femur ORIF EXAM: LEFT FEMUR PORTABLE 2 VIEWS COMPARISON:  April 08, 2021 FINDINGS: Osteopenia. Status post ORIF of the distal femur. Orthopedic hardware is intact and without new periprosthetic fracture or lucency. Revisualization of a comminuted fracture of the distal femur. There is improved alignment of the fracture fragments with decreased foreshortening. Soft tissue air consistent with recent  surgical intervention. Air within the knee joint space consistent with surgical intervention. Moderate joint effusion. IMPRESSION: Expected postsurgical appearance status post ORIF of the LEFT distal femur. Electronically Signed   By: Valentino Saxon M.D.   On: 04/10/2021 14:20      Subjective: Patient seen and examined at the bedside this morning.  Hemodynamically stable.  Comfortable.  Stable for discharge whenever possible  Discharge Exam: Vitals:   04/16/21 0321 04/16/21 0805  BP: 130/69 136/82  Pulse:  65  Resp: 18 16  Temp: 99 F (37.2 C) 98.5 F (36.9 C)  SpO2: 100% 100%   Vitals:   04/15/21 2327 04/16/21 0235 04/16/21 0321 04/16/21 0805  BP: (!) 144/72  130/69 136/82  Pulse:    65  Resp: '17 20 18 16  '$ Temp: 98.4 F (36.9 C)  99 F (37.2 C) 98.5 F (36.9 C)  TempSrc: Oral  Oral Oral  SpO2: 99%  100% 100%  Weight:      Height:        General: Pt is alert, awake, not in acute distress Cardiovascular: RRR, S1/S2 +, no rubs, no gallops Respiratory: CTA bilaterally, no wheezing, no rhonchi Abdominal: Soft, NT, ND, bowel sounds + Extremities: no edema, no cyanosis    The results of significant diagnostics from this hospitalization (including imaging, microbiology, ancillary and laboratory) are listed below for reference.     Microbiology: Recent Results (from the past 240 hour(s))  Resp Panel by RT-PCR (Flu A&B, Covid) Nasopharyngeal Swab     Status: None   Collection Time: 04/08/21  9:37 PM   Specimen: Nasopharyngeal Swab; Nasopharyngeal(NP) swabs in vial transport medium  Result Value Ref Range Status   SARS Coronavirus 2 by RT PCR NEGATIVE NEGATIVE Final    Comment: (NOTE) SARS-CoV-2 target nucleic acids are NOT DETECTED.  The SARS-CoV-2 RNA is generally detectable in upper respiratory specimens during the acute phase of infection. The lowest concentration of SARS-CoV-2 viral copies this assay can detect is 138 copies/mL. A negative result does not  preclude SARS-Cov-2 infection and should not be used as the sole basis for treatment or other patient management decisions. A negative result may occur with  improper specimen collection/handling, submission of specimen other than nasopharyngeal swab, presence of viral mutation(s) within the areas targeted by this assay, and inadequate number of viral copies(<138 copies/mL). A negative result must be combined with clinical observations, patient history, and epidemiological information. The expected result is Negative.  Fact Sheet for Patients:  EntrepreneurPulse.com.au  Fact Sheet for Healthcare Providers:  IncredibleEmployment.be  This test is no t yet approved or cleared by the [place] FDA and  has been authorized for detection and/or diagnosis of SARS-CoV-2 by FDA under an Emergency Use Authorization (EUA). This EUA will remain  in effect (meaning this test can be used) for the duration of the COVID-19 declaration under Section 564(b)(1) of the Act, 21  U.S.C.section 360bbb-3(b)(1), unless the authorization is terminated  or revoked sooner.       Influenza A by PCR NEGATIVE NEGATIVE Final   Influenza B by PCR NEGATIVE NEGATIVE Final    Comment: (NOTE) The Xpert Xpress SARS-CoV-2/FLU/RSV plus assay is intended as an aid in the diagnosis of influenza from Nasopharyngeal swab specimens and should not be used as a sole basis for treatment. Nasal washings and aspirates are unacceptable for Xpert Xpress SARS-CoV-2/FLU/RSV testing.  Fact Sheet for Patients: EntrepreneurPulse.com.au  Fact Sheet for Healthcare Providers: IncredibleEmployment.be  This test is not yet approved or cleared by the [place] FDA and has been authorized for detection and/or diagnosis of SARS-CoV-2 by FDA under an Emergency Use Authorization (EUA). This EUA will remain in effect (meaning this test can be used) for the  duration of the COVID-19 declaration under Section 564(b)(1) of the Act, 21 U.S.C. section 360bbb-3(b)(1), unless the authorization is terminated or revoked.  Performed at [facility], [place] [address], [place], [place] [address]   Surgical PCR screen     Status: None   Collection Time: 04/10/21  8:07 AM   Specimen: Nasal Mucosa; Nasal Swab  Result Value Ref Range Status   MRSA, PCR NEGATIVE NEGATIVE Final   Staphylococcus aureus NEGATIVE NEGATIVE Final    Comment: (NOTE) The Xpert SA Assay (FDA approved for NASAL specimens in patients 73 years of age and older), is one component of a comprehensive surveillance program. It is not intended to diagnose infection nor to guide or monitor treatment. Performed at [facility], [place] [address], [place], [place] [address]      Labs: BNP (last 3 results) No results for input(s): BNP in the last 8760 hours. Basic Metabolic Panel: Recent Labs  Lab 04/10/21 0536 04/11/21 0219 04/12/21 0231 04/15/21 0302  NA 133* 138 138 140  K 4.3 4.3 3.7 3.3*  CL 100 103 105 104  CO2 21* '23 26 26  '$ GLUCOSE 117* 117* 106* 130*  BUN 13 24* 30* 16  CREATININE 0.71 0.88 0.81 0.71  CALCIUM 9.2 9.0 8.8* 9.2  MG 2.4  --   --   --    Liver Function Tests: No results for input(s): AST, ALT, ALKPHOS, BILITOT, PROT, ALBUMIN in the last 168 hours. No results for input(s): LIPASE, AMYLASE in the last 168 hours. No results for input(s): AMMONIA in the last 168 hours. CBC: Recent Labs  Lab 04/10/21 0536 04/11/21 0219 04/12/21 0231 04/14/21 1409 04/15/21 0302  WBC 9.8 11.5* 8.9 8.1 6.8  HGB 11.1* 9.1* 8.2* 8.7* 8.2*  HCT 35.5* 28.1* 25.6* 26.9* 25.2*  MCV 89.0 87.8 87.4 87.9 87.8  PLT 199 182 182 287 257   Cardiac Enzymes: No results for input(s): CKTOTAL, CKMB, CKMBINDEX, TROPONINI in the last 168 hours. BNP: Invalid input(s): POCBNP CBG: No results for input(s): GLUCAP in the last 168 hours. D-Dimer No results for  input(s): DDIMER in the last 72 hours. Hgb A1c No results for input(s): HGBA1C in the last 72 hours. Lipid Profile No results for input(s): CHOL, HDL, LDLCALC, TRIG, CHOLHDL, LDLDIRECT in the last 72 hours. Thyroid function studies No results for input(s): TSH, T4TOTAL, T3FREE, THYROIDAB in the last 72 hours.  Invalid input(s): FREET3 Anemia work up No results for input(s): VITAMINB12, FOLATE, FERRITIN, TIBC, IRON, RETICCTPCT in the last 72 hours. Urinalysis    Component Value Date/Time   COLORURINE YELLOW 12/07/2007 0940   APPEARANCEUR CLEAR 12/07/2007 0940   LABSPEC 1.016 12/07/2007 0940  PHURINE 6.5 12/07/2007 0940   GLUCOSEU NEGATIVE 12/07/2007 0940   HGBUR NEGATIVE 12/07/2007 0940   BILIRUBINUR NEGATIVE 12/07/2007 0940   KETONESUR NEGATIVE 12/07/2007 0940   PROTEINUR 30 (A) 12/07/2007 0940   UROBILINOGEN 0.2 12/07/2007 0940   NITRITE NEGATIVE 12/07/2007 0940   LEUKOCYTESUR TRACE (A) 12/07/2007 0940   Sepsis Labs Invalid input(s): PROCALCITONIN,  WBC,  LACTICIDVEN Microbiology Recent Results (from the past 240 hour(s))  Resp Panel by RT-PCR (Flu A&B, Covid) Nasopharyngeal Swab     Status: None   Collection Time: 04/08/21  9:37 PM   Specimen: Nasopharyngeal Swab; Nasopharyngeal(NP) swabs in vial transport medium  Result Value Ref Range Status   SARS Coronavirus 2 by RT PCR NEGATIVE NEGATIVE Final    Comment: (NOTE) SARS-CoV-2 target nucleic acids are NOT DETECTED.  The SARS-CoV-2 RNA is generally detectable in upper respiratory specimens during the acute phase of infection. The lowest concentration of SARS-CoV-2 viral copies this assay can detect is 138 copies/mL. A negative result does not preclude SARS-Cov-2 infection and should not be used as the sole basis for treatment or other patient management decisions. A negative result may occur with  improper specimen collection/handling, submission of specimen other than nasopharyngeal swab, presence of viral mutation(s)  within the areas targeted by this assay, and inadequate number of viral copies(<138 copies/mL). A negative result must be combined with clinical observations, patient history, and epidemiological information. The expected result is Negative.  Fact Sheet for Patients:  EntrepreneurPulse.com.au  Fact Sheet for Healthcare Providers:  IncredibleEmployment.be  This test is no t yet approved or cleared by the [place] FDA and  has been authorized for detection and/or diagnosis of SARS-CoV-2 by FDA under an Emergency Use Authorization (EUA). This EUA will remain  in effect (meaning this test can be used) for the duration of the COVID-19 declaration under Section 564(b)(1) of the Act, 21 U.S.C.section 360bbb-3(b)(1), unless the authorization is terminated  or revoked sooner.       Influenza A by PCR NEGATIVE NEGATIVE Final   Influenza B by PCR NEGATIVE NEGATIVE Final    Comment: (NOTE) The Xpert Xpress SARS-CoV-2/FLU/RSV plus assay is intended as an aid in the diagnosis of influenza from Nasopharyngeal swab specimens and should not be used as a sole basis for treatment. Nasal washings and aspirates are unacceptable for Xpert Xpress SARS-CoV-2/FLU/RSV testing.  Fact Sheet for Patients: EntrepreneurPulse.com.au  Fact Sheet for Healthcare Providers: IncredibleEmployment.be  This test is not yet approved or cleared by the [place] FDA and has been authorized for detection and/or diagnosis of SARS-CoV-2 by FDA under an Emergency Use Authorization (EUA). This EUA will remain in effect (meaning this test can be used) for the duration of the COVID-19 declaration under Section 564(b)(1) of the Act, 21 U.S.C. section 360bbb-3(b)(1), unless the authorization is terminated or revoked.  Performed at [facility], [place] [address], [place], [place] [address]   Surgical PCR screen     Status:  None   Collection Time: 04/10/21  8:07 AM   Specimen: Nasal Mucosa; Nasal Swab  Result Value Ref Range Status   MRSA, PCR NEGATIVE NEGATIVE Final   Staphylococcus aureus NEGATIVE NEGATIVE Final    Comment: (NOTE) The Xpert SA Assay (FDA approved for NASAL specimens in patients 26 years of age and older), is one component of a comprehensive surveillance program. It is not intended to diagnose infection nor to guide or monitor treatment. Performed at [facility], [place] [address] [place], [place]  [address]     Please note: You were cared for by a hospitalist during your hospital stay. Once you are discharged, your primary care physician will handle any further medical issues. Please note that NO REFILLS for any discharge medications will be authorized once you are discharged, as it is imperative that you return to your primary care physician (or establish a relationship with a primary care physician if you do not have one) for your post hospital discharge needs so that they can reassess your need for medications and monitor your lab values.    Time coordinating discharge: 40 minutes  SIGNED:   Shelly Coss, MD  [facility] 04/16/2021, 11:51 AM Pager [phone]  If 7PM-7AM, please contact night-coverage [email or web address] Password [email or web address]

## 2021-04-16 NOTE — Assessment & Plan Note (Signed)
Has mild left-sided residual weakness.  Follows with neurology ?

## 2021-04-16 NOTE — H&P (Incomplete)
Physical Medicine and Rehabilitation Admission H&P    Chief Complaint  Patient presents with   Fall  CC: debility secondary to left femur fracture  HPI: Tammy [name] is a 66 year old female who was in her usual state of good health when she fell in a store parking lot on 04/08/2021.  She tripped on a concrete block and needed help getting up from a bystander who called EMS.  She was brought to [facility] emergency department where imaging revealed comminuted and displaced fracture of distal left femur metaphysis.  Orthopedic surgery was consulted and recommended transfer to [facility] due to lack of operating room availability at [facility]. She had no other injuries. She was on no blood thinners. Dr. Marcelino Scot was consulted and the patient was taken to the OR on 3/2 and underwent ORIF of left distal femur with intercondylar extension.  Found to be hypokalemic on follow-up blood work yesterday and this was repleted.  Anemia noted with hemoglobin of 8.2.  Iron studies pending.  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 Jaynee Eagles.  History of left breast cancer 2022 status postlumpectomy, radiation therapy on anastrozole, history of liver cirrhosis due to to chronic hepatitis C virus and is followed by [facility]. History of hypertension and bipolar disorder.  Last bowel movement was on Monday. Says she recently quit smoking. No alcohol intake. Lives with fiance in single level home with 4 steps to entry.  Review of Systems  Constitutional:  Negative for chills and fever.  HENT:  Negative for congestion and sore throat.   Eyes:  Negative for blurred vision and double vision.  Respiratory:  Negative for shortness of breath.   Cardiovascular:  Negative for chest pain.  Gastrointestinal:  Negative for abdominal pain, constipation, diarrhea, nausea and vomiting.  Genitourinary:  Negative for  dysuria and urgency.  Musculoskeletal:        Anterior chest muscle soreness from using walker. Proximal left lower leg pain.  Skin:  Negative for itching and rash.   Past Medical History:  Diagnosis Date   Arthritis    Bipolar disorder ([place])    Breast cancer ([place])    Depression    Family history of breast cancer 07/10/2020   Family history of colon cancer 07/10/2020   Family history of ovarian cancer 07/10/2020   GERD (gastroesophageal reflux disease)    HTN (hypertension)    Stroke ([place]) 09/2012   slurred speech and left sided weakness/notes 10/04/2012   Past Surgical History:  Procedure Laterality Date   BREAST LUMPECTOMY WITH RADIOACTIVE SEED AND SENTINEL LYMPH NODE BIOPSY Left 07/26/2020   Procedure: LEFT BREAST LUMPECTOMY WITH RADIOACTIVE SEED AND SENTINEL LYMPH NODE BIOPSY;  Surgeon: [name], MD;  Location: [facility];  Service: General;  Laterality: Left;   [place]   fracture of leg     2 months ago   ORIF FEMUR FRACTURE Left 04/10/2021   Procedure: OPEN REDUCTION INTERNAL FIXATION (ORIF) DISTAL FEMUR FRACTURE;  Surgeon: Altamese Aromas, MD;  Location: [facility];  Service: Orthopedics;  Laterality: Left;   Family History  Problem Relation Age of Onset   Other Mother        blood clots   Diabetes Brother    Neuropathy Brother        diabetic neuropathy   Diabetes Sister    Neuropathy Sister        diabetic neuropathy  Diabetes Paternal Aunt    Breast cancer Maternal Aunt        16   Breast cancer Other        PGM's sister; dx mid 25s   Colon cancer Maternal Aunt 29   Colon cancer Other        PGM's brother; dx 70s   Ovarian cancer Other        PGM's mother; dx unknown age   Social History:  reports that she has been smoking cigarettes. She has a 20.00 pack-year smoking history. She has never used smokeless tobacco. She reports that she does not currently use alcohol. She reports that she does not use drugs. Allergies:   Allergies  Allergen Reactions   Lisinopril Cough   Medications Prior to Admission  Medication Sig Dispense Refill   acetaminophen (TYLENOL) 500 MG tablet Take 1,000 mg by mouth every 6 (six) hours as needed for mild pain.     amLODipine (NORVASC) 10 MG tablet Take 10 mg by mouth daily.     anastrozole (ARIMIDEX) 1 MG tablet Take 1 tablet (1 mg total) by mouth daily. 90 tablet 4   atenolol (TENORMIN) 25 MG tablet Take 25 mg by mouth daily.     gabapentin (NEURONTIN) 100 MG capsule Take 200 mg by mouth at bedtime.     hydrALAZINE (APRESOLINE) 25 MG tablet Take 25 mg by mouth 2 (two) times daily.     hydrochlorothiazide (HYDRODIURIL) 25 MG tablet Take 25 mg by mouth daily.     HYDROcodone-acetaminophen (NORCO/VICODIN) 5-325 MG tablet Take 1 tablet by mouth every 4 (four) hours as needed for pain.     meloxicam (MOBIC) 15 MG tablet Take 15 mg by mouth daily.     Multiple Vitamin (MULTIVITAMIN PO) Take 1 tablet by mouth daily.     pantoprazole (PROTONIX) 40 MG tablet Take 40 mg by mouth daily.     buPROPion (WELLBUTRIN SR) 150 MG 12 hr tablet Take 150 mg by mouth 2 (two) times daily.     gabapentin (NEURONTIN) 300 MG capsule Take 1-2 capsules (300-600 mg total) by mouth 2 (two) times daily. (Patient not taking: Reported on 04/09/2021) 360 capsule 2      Home: Home Living Family/patient expects to be discharged to:: Private residence Living Arrangements: Spouse/significant other Available Help at Discharge: Family Type of Home: Mobile home Home Access: Stairs to enter Technical brewer of Steps: 5 Entrance Stairs-Rails: Right, Left Home Layout: One level Bathroom Shower/Tub: Tub/shower unit Home Equipment: Industrial/product designer History: Prior Function Prior Level of Function : Independent/Modified Independent Mobility Comments: retired Pharmacologist Status:  Mobility: Bed Mobility Overal bed mobility: Needs Assistance Bed Mobility: Supine to Sit, Sit to  Supine Supine to sit: Min guard Sit to supine: Min guard General bed mobility comments: pt OOB upone arrival Transfers Overall transfer level: Needs assistance Equipment used: Rolling walker (2 wheels) Transfers: Sit to/from Stand Sit to Stand: Min guard Bed to/from chair/wheelchair/BSC transfer type:: Stand pivot Stand pivot transfers: Min assist General transfer comment: adheres well to WB precautions Ambulation/Gait Ambulation/Gait assistance: Min guard Gait Distance (Feet): 20 Feet Assistive device: Rolling walker (2 wheels) Gait Pattern/deviations: Step-to pattern General Gait Details: hop to gait pattern on RLE, good compliance with NWB on LLE, distance limitied by fatigue and to pt stated tolerance Gait velocity: decr    ADL: ADL Overall ADL's : Needs assistance/impaired Eating/Feeding: Independent, Sitting Grooming: Set up, Sitting Grooming Details (indicate cue type and reason): washes  hands Upper Body Bathing: Set up, Sitting Upper Body Bathing Details (indicate cue type and reason): pt set up with bath supplies in chair Lower Body Bathing: Set up, Sitting/lateral leans Lower Body Bathing Details (indicate cue type and reason): pt set up with bath supplies in chair Upper Body Dressing : Set up, Sitting Upper Body Dressing Details (indicate cue type and reason): pt set up with gown after bathing Lower Body Dressing: Supervision/safety, Sitting/lateral leans Lower Body Dressing Details (indicate cue type and reason): dons sock sitting EOB Toilet Transfer: Min guard, Regular Toilet, Ambulation, Rolling walker (2 wheels) Toileting- Water quality scientist and Hygiene: Sit to/from stand, Min guard Toileting - Clothing Manipulation Details (indicate cue type and reason): performs pericare in standing Functional mobility during ADLs: Min guard, Rolling walker (2 wheels)  Cognition: Cognition Overall Cognitive Status: Within Functional Limits for tasks assessed Orientation  Level: Oriented X4 Cognition Arousal/Alertness: Awake/alert Behavior During Therapy: WFL for tasks assessed/performed Overall Cognitive Status: Within Functional Limits for tasks assessed  Physical Exam: Blood pressure 136/82, pulse 65, temperature 98.5 F (36.9 C), temperature source Oral, resp. rate 16, height '5\' 2"'$  (1.575 m), weight 77.6 kg, SpO2 100 %. Physical Exam Constitutional:      General: She is not in acute distress.    Appearance: Normal appearance.     Comments: OOB to chair with legs elevated.  HENT:     Head: Normocephalic and atraumatic.  Cardiovascular:     Rate and Rhythm: Normal rate and regular rhythm.     Comments: Systolic murmur Pulmonary:     Effort: Pulmonary effort is normal.     Breath sounds: Normal breath sounds.  Abdominal:     General: Bowel sounds are normal. There is no distension.     Palpations: Abdomen is soft.     Tenderness: There is no abdominal tenderness.  Musculoskeletal:     Comments: Left surgical incisional dressing intact; TED hose in place; LLE hinged brace in place  Skin:    General: Skin is warm and dry.  Neurological:     Mental Status: She is alert. Mental status is at baseline.    Results for orders placed or performed during the hospital encounter of 04/08/21 (from the past 48 hour(s))  CBC     Status: Abnormal   Collection Time: 04/14/21  2:09 PM  Result Value Ref Range   WBC 8.1 4.0 - 10.5 K/uL   RBC 3.06 (L) 3.87 - 5.11 MIL/uL   Hemoglobin 8.7 (L) 12.0 - 15.0 g/dL   HCT 26.9 (L) 36.0 - 46.0 %   MCV 87.9 80.0 - 100.0 fL   MCH 28.4 26.0 - 34.0 pg   MCHC 32.3 30.0 - 36.0 g/dL   RDW 14.3 11.5 - 15.5 %   Platelets 287 150 - 400 K/uL   nRBC 0.0 0.0 - 0.2 %    Comment: Performed at [facility], [place] [address] [place], [place] [address]  CBC     Status: Abnormal   Collection Time: 04/15/21  3:02 AM  Result Value Ref Range   WBC 6.8 4.0 - 10.5 K/uL   RBC 2.87 (L) 3.87 - 5.11 MIL/uL   Hemoglobin 8.2 (L) 12.0  - 15.0 g/dL   HCT 25.2 (L) 36.0 - 46.0 %   MCV 87.8 80.0 - 100.0 fL   MCH 28.6 26.0 - 34.0 pg   MCHC 32.5 30.0 - 36.0 g/dL   RDW 14.2 11.5 - 15.5 %   Platelets 257 150 -  400 K/uL   nRBC 0.0 0.0 - 0.2 %    Comment: Performed at [facility], [place] [address], [place], [place] [address]  Basic metabolic panel     Status: Abnormal   Collection Time: 04/15/21  3:02 AM  Result Value Ref Range   Sodium 140 135 - 145 mmol/L   Potassium 3.3 (L) 3.5 - 5.1 mmol/L   Chloride 104 98 - 111 mmol/L   CO2 26 22 - 32 mmol/L   Glucose, Bld 130 (H) 70 - 99 mg/dL    Comment: Glucose reference range applies only to samples taken after fasting for at least 8 hours.   BUN 16 8 - 23 mg/dL   Creatinine, Ser 0.71 0.44 - 1.00 mg/dL   Calcium 9.2 8.9 - 10.3 mg/dL   GFR, Estimated >60 >60 mL/min    Comment: (NOTE) Calculated using the CKD-EPI Creatinine Equation (2021)    Anion gap 10 5 - 15    Comment: Performed at [facility] [address], [place], [place] [address]   No results found.    Blood pressure 136/82, pulse 65, temperature 98.5 F (36.9 C), temperature source Oral, resp. rate 16, height '5\' 2"'$  (1.575 m), weight 77.6 kg, SpO2 100 %.  Medical Problem List and Plan: 1. Functional deficits secondary to left femur fracture  -patient may *** shower  -ELOS/Goals: *** 2.  Antithrombotics: -DVT/anticoagulation:  Pharmaceutical: Other (comment) apixaban 2.5 mg twice daily.  Anticipated course of 6 weeks per ortho  -antiplatelet therapy: None 3. Pain Management: Tylenol, oxycodone as needed -- Neurontin 100 mg twice daily --Ice and elevate for swelling/pain control 4. Mood: LCSW to evaluate and provide emotional support  -antipsychotic agents: None 5. Neuropsych: This patient is capable of making decisions on her own behalf. 6. Skin/Wound Care: Routine skin care checks  -- Monitor surgical incision  -- Nylon sutures in place; remove approximately 3/16  -- Dressing changes as  needed 7. Fluids/Electrolytes/Nutrition: Routine I's and O's and follow-up chemistries 8: Hypokalemia: Recheck/monitor BMP 9: Vitamin D deficiency: Continue supplementation  -- Ortho recommending DEXA scan in next 4 to 8 weeks  -- Referral to osteoporosis clinic 10: Acute blood loss anemia: 11.4>>8.2  Recheck/monitor CBC 11: Hypertension: Continue amlodipine, hydralazine 12: Recent treatment for breast cancer: Continue Arimidex daily 13: Left femur fracture status post ORIF:  -- Hinged brace in place   -- Nonweightbearing left lower extremity x 6 to 8 weeks  -- Unrestricted range of motion left knee in hinged brace (this can be off when in bed)  --May work on range of motion without brace with therapist supervision   ***  Barbie Banner, PA-C 04/16/2021

## 2021-04-16 NOTE — Assessment & Plan Note (Signed)
Supplemented.Check BMP tomorrow ?

## 2021-04-16 NOTE — Assessment & Plan Note (Signed)
-  monitor hemoglobin, no active bleeding.  There is a component of anemia of chronic disease in the setting of underlying malignancy.  Hemoglobin overall stable ?

## 2021-04-16 NOTE — Progress Notes (Signed)
Inpatient Rehabilitation Admission Medication Review by a Pharmacist ? ?A complete drug regimen review was completed for this patient to identify any potential clinically significant medication issues. ? ?High Risk Drug Classes Is patient taking? Indication by Medication  ?Antipsychotic Yes Compazine prn N/V  ?Anticoagulant Yes Eliquis for VTE ppx  ?Antibiotic No   ?Opioid Yes Oxycodone for pain  ?Antiplatelet No   ?Hypoglycemics/insulin No   ?Vasoactive Medication Yes Amlodipine, hydralazine for BP  ?Chemotherapy Yes, Oral Chemotherapy Arimidex for breast cancer  ?Other Yes Gabapentin for pain  ? ? ? ?Type of Medication Issue Identified Description of Issue Recommendation(s)  ?Drug Interaction(s) (clinically significant) ?    ?Duplicate Therapy ?    ?Allergy ?    ?No Medication Administration End Date ?    ?Incorrect Dose ?    ?Additional Drug Therapy Needed ?    ?Significant med changes from prior encounter (inform family/care partners about these prior to discharge).    ?Other ?    ? ? ?Clinically significant medication issues were identified that warrant physician communication and completion of prescribed/recommended actions by midnight of the next day:  No ? ? ?Pharmacist comments: Newly prescribed Wellbutrin as an outpatient ? ?Time spent performing this drug regimen review (minutes):  20 minutes ? ? ?Tad Moore ?04/16/2021 3:44 PM ?

## 2021-04-17 LAB — CBC WITH DIFFERENTIAL/PLATELET
Abs Immature Granulocytes: 0.03 10*3/uL (ref 0.00–0.07)
Basophils Absolute: 0 10*3/uL (ref 0.0–0.1)
Basophils Relative: 0 %
Eosinophils Absolute: 0.2 10*3/uL (ref 0.0–0.5)
Eosinophils Relative: 4 %
HCT: 25 % — ABNORMAL LOW (ref 36.0–46.0)
Hemoglobin: 7.9 g/dL — ABNORMAL LOW (ref 12.0–15.0)
Immature Granulocytes: 1 %
Lymphocytes Relative: 17 %
Lymphs Abs: 0.9 10*3/uL (ref 0.7–4.0)
MCH: 27.9 pg (ref 26.0–34.0)
MCHC: 31.6 g/dL (ref 30.0–36.0)
MCV: 88.3 fL (ref 80.0–100.0)
Monocytes Absolute: 0.4 10*3/uL (ref 0.1–1.0)
Monocytes Relative: 8 %
Neutro Abs: 3.7 10*3/uL (ref 1.7–7.7)
Neutrophils Relative %: 70 %
Platelets: 285 10*3/uL (ref 150–400)
RBC: 2.83 MIL/uL — ABNORMAL LOW (ref 3.87–5.11)
RDW: 14.6 % (ref 11.5–15.5)
WBC: 5.2 10*3/uL (ref 4.0–10.5)
nRBC: 0 % (ref 0.0–0.2)

## 2021-04-17 LAB — COMPREHENSIVE METABOLIC PANEL
ALT: 14 U/L (ref 0–44)
AST: 16 U/L (ref 15–41)
Albumin: 3 g/dL — ABNORMAL LOW (ref 3.5–5.0)
Alkaline Phosphatase: 81 U/L (ref 38–126)
Anion gap: 9 (ref 5–15)
BUN: 13 mg/dL (ref 8–23)
CO2: 26 mmol/L (ref 22–32)
Calcium: 9.3 mg/dL (ref 8.9–10.3)
Chloride: 105 mmol/L (ref 98–111)
Creatinine, Ser: 0.66 mg/dL (ref 0.44–1.00)
GFR, Estimated: >60 mL/min (ref >60)
Glucose, Bld: 110 mg/dL — ABNORMAL HIGH (ref 70–99)
Potassium: 3.6 mmol/L (ref 3.5–5.1)
Sodium: 140 mmol/L (ref 135–145)
Total Bilirubin: 0.7 mg/dL (ref 0.3–1.2)
Total Protein: 6.8 g/dL (ref 6.5–8.1)

## 2021-04-17 MED ORDER — MUSCLE RUB 10-15 % EX CREA
TOPICAL_CREAM | CUTANEOUS | Status: DC | PRN
  Administered 2021-04-18 – 2021-04-19 (×2): 1 via TOPICAL
  Filled 2021-04-17: qty 85

## 2021-04-17 MED ORDER — POTASSIUM CHLORIDE 20 MEQ PO PACK: 40.0000 meq | PACK | Freq: Once | ORAL | Status: DC

## 2021-04-17 MED ORDER — DOCUSATE SODIUM 100 MG PO CAPS
100.0000 mg | ORAL_CAPSULE | Freq: Every day | ORAL | Status: DC | PRN
  Administered 2021-04-18 – 2021-04-20 (×2): 100 mg via ORAL
  Filled 2021-04-17 (×2): qty 1

## 2021-04-17 MED ORDER — PANTOPRAZOLE SODIUM 40 MG PO TBEC
40.0000 mg | DELAYED_RELEASE_TABLET | Freq: Every day | ORAL | Status: DC
  Administered 2021-04-18 – 2021-04-22 (×5): 40 mg via ORAL
  Filled 2021-04-17 (×5): qty 1

## 2021-04-17 MED ORDER — MAGNESIUM GLUCONATE 500 MG PO TABS
250.0000 mg | ORAL_TABLET | Freq: Every day | ORAL | Status: DC
  Administered 2021-04-17: 21:00:00 250 mg via ORAL
  Filled 2021-04-17: qty 1

## 2021-04-17 NOTE — Progress Notes (Signed)
?                                                       PROGRESS NOTE ? ? ?Subjective/Complaints: ?C/o chest soreness ?Has pain in LLE but it improves with pain medication and this lasts until next dose ?She does not like the taste of the potassium supplements ? ?ROS: +LLE pain ? ? ?Objective: ?  ?No results found. ?Recent Labs  ?  04/15/21 ?0302 04/17/21 ?6387  ?WBC 6.8 5.2  ?HGB 8.2* 7.9*  ?HCT 25.2* 25.0*  ?PLT 257 285  ? ?Recent Labs  ?  04/15/21 ?0302 04/17/21 ?5643  ?NA 140 140  ?K 3.3* 3.6  ?CL 104 105  ?CO2 26 26  ?GLUCOSE 130* 110*  ?BUN 16 13  ?CREATININE 0.71 0.66  ?CALCIUM 9.2 9.3  ? ? ?Intake/Output Summary (Last 24 hours) at 04/17/2021 1039 ?Last data filed at 04/16/2021 1900 ?Gross per 24 hour  ?Intake 120 ml  ?Output --  ?Net 120 ml  ?  ? ?  ? ?Physical Exam: ?Vital Signs ?Blood pressure 139/63, pulse 65, temperature 97.6 ?F (36.4 ?C), temperature source Oral, resp. rate 18, height '5\' 2"'$  (1.575 m), weight 71.6 kg, SpO2 100 %. ?Gen: no distress, normal appearing ?HEENT: oral mucosa pink and moist, NCAT ?Cardiovascular:  ?   Rate and Rhythm: Normal rate and regular rhythm.  ?   Comments: Systolic murmur ?Pulmonary:  ?   Effort: Pulmonary effort is normal.  ?   Breath sounds: Normal breath sounds.  ?Abdominal:  ?   General: Bowel sounds are normal. There is no distension.  ?   Palpations: Abdomen is soft.  ?   Tenderness: There is no abdominal tenderness.  ?Musculoskeletal:  ?   Comments: Left surgical incisional dressing intact; TED hose in place; LLE hinged brace in place  ?Skin: ?   General: Skin is warm and dry.  ?Neurological:  ?   Mental Status: She is alert. Mental status is at baseline.  ? ? ?Assessment/Plan: ?1. Functional deficits which require 3+ hours per day of interdisciplinary therapy in a comprehensive inpatient rehab setting. ?Physiatrist is providing close team supervision and 24 hour management of active medical problems listed below. ?Physiatrist and rehab team continue to assess  barriers to discharge/monitor patient progress toward functional and medical goals ? ?Care Tool: ? ?Bathing ?   ?   ?   ?  ?  ?Bathing assist   ?  ?  ?Upper Body Dressing/Undressing ?Upper body dressing Upper body dressing/undressing activity did not occur (including orthotics): N/A ?What is the patient wearing?: [place] only ?   ?Upper body assist Assist Level: Minimal Assistance - Patient > 75% ?   ?Lower Body Dressing/Undressing ?Lower body dressing ? ? ? Lower body dressing activity did not occur: N/A ?What is the patient wearing?: Underwear/pull up ? ?  ? ?Lower body assist Assist for lower body dressing: Maximal Assistance - Patient 25 - 49% ?   ? ?Toileting ?Toileting Toileting Activity did not occur (Probation officer and hygiene only): N/A (no void or bm)  ?Toileting assist Assist for toileting: (P) Moderate Assistance - Patient 50 - 74% ?  ?  ?Transfers ?Chair/bed transfer ? ?Transfers assist ?   ? ?Chair/bed transfer assist level: Moderate Assistance - Patient 50 - 74% ?  ?  ?  Locomotion ?Ambulation ? ? ?Ambulation assist ? ?   ? ?  ?  ?   ? ?Walk 10 feet activity ? ? ?Assist ?   ? ?  ?   ? ?Walk 50 feet activity ? ? ?Assist   ? ?  ?   ? ? ?Walk 150 feet activity ? ? ?Assist   ? ?  ?  ?  ? ?Walk 10 feet on uneven surface  ?activity ? ? ?Assist   ? ? ?  ?   ? ?Wheelchair ? ? ? ? ?Assist Is the patient using a wheelchair?: No ?  ?  ? ?  ?   ? ? ?Wheelchair 50 feet with 2 turns activity ? ? ? ?Assist ? ?  ?  ? ? ?   ? ?Wheelchair 150 feet activity  ? ? ? ?Assist ?   ? ? ?   ? ?Blood pressure 139/63, pulse 65, temperature 97.6 ?F (36.4 ?C), temperature source Oral, resp. rate 18, height '5\' 2"'$  (1.575 m), weight 71.6 kg, SpO2 100 %. ? ? ?Medical Problem List and Plan: ?1. Functional deficits secondary to left femur fracture ?            -patient may shower but incisions must be covered ?            -ELOS/Goals: 10-12 days S ?           Initial CIR evals today ?2.  Antithrombotics: ?-DVT/anticoagulation:   Pharmaceutical: Other (comment) apixaban 2.5 mg twice daily.  Anticipated course of 6 weeks per ortho ?            -antiplatelet therapy: None ?3. Postoperative pain: Tylenol, oxycodone as needed. Asked nursing to bring her an oxycodone now.  ?-- Neurontin 100 mg twice daily ?--Ice and elevate for swelling/pain control ?4. Mood: LCSW to evaluate and provide emotional support ?            -antipsychotic agents: None ?5. Neuropsych: This patient is capable of making decisions on her own behalf. ?6. Skin/Wound Care: Routine skin care checks ?            -- Monitor surgical incision ?            -- Nylon sutures in place; remove approximately 3/16 ?            -- Dressing changes as needed ?7. Fluids/Electrolytes/Nutrition: Routine I's and O's and follow-up chemistries ?8: Hypokalemia: improved with supplementation, advised regarding high potassium foods ?9: Vitamin D deficiency: Level 10.88 on 04/11/21.Continue ergocalciferol 50,000U once per week for 7 weeks. ?            -- Ortho recommending DEXA scan in next 4 to 8 weeks ?            -- Referral to osteoporosis clinic ?10: Acute blood loss anemia: 11.4>>8.2. Iron panel reviewed and stable.  ?11: Hypertension: Continue amlodipine, hydralazine. Start magnesium gluconate '250mg'$  HS ?12: Recent treatment for breast cancer: Continue Arimidex daily ?13: Left femur fracture status post ORIF: ?            -- Hinged brace in place  ?            -- Nonweightbearing left lower extremity x 6 to 8 weeks ?            -- Unrestricted range of motion left knee in hinged brace (this can be off when in bed) ?            --  May work on range of motion without brace with therapist supervision ? -discussed best to keep compression garment in place to reduce swelling ?LOS: ?1 days ?A FACE TO FACE EVALUATION WAS PERFORMED ? ?Martha Clan P Brenae Lasecki ?04/17/2021, 10:39 AM  ? ?  ?

## 2021-04-17 NOTE — Progress Notes (Signed)
Physical Therapy Session Note ? ?Patient Details  ?Name: Tammy [name] ?MRN: [id number] ?Date of Birth: 12-07-1955 ? ?Today's Date: 04/17/2021 ?PT Individual Time: 9528-4132 ?PT Individual Time Calculation (min): 54 min  ? ?Short Term Goals: ?Week 1:  PT Short Term Goal 1 (Week 1): STG=LTG due to ELOS. ? ?Skilled Therapeutic Interventions/Progress Updates:  ?Ambulation/gait training;Discharge planning;DME/adaptive equipment instruction;Functional mobility training;Pain management;Psychosocial support;Splinting/orthotics;Therapeutic Activities;UE/LE Strength taining/ROM;Wheelchair propulsion/positioning;UE/LE Coordination activities;Therapeutic Exercise;Stair training;Skin care/wound management;Patient/family education;Neuromuscular re-education;Functional electrical stimulation;Disease management/prevention;Community reintegration;Balance/vestibular training  ? ?Pt received supine in bed and agrees to therapy. Reports 2/10 pain in L leg. PT provides mobility and rest breaks to manage pain. Supine to sit with verbal cues for sequencing and positioning at EOB. Pt perform sit to stand to RW with CGA and cues for hand placement and body mechanics. Stand pivot transfer to [facility] with CGA and cues for positioning. WC transport to gym. Stand pivot to mat with RW and CGA. Sit to supine with cues for positioning. PT repositions bledsoe brace for better stability and fit. Pt then performs supine therex for strengthening and ROM of L lower extremity, including 1x10: ?Quad sets ?Glute sets ?Hip abduction  (AAORM) ?Heel slides ?SAQs ?SLRs ?Pt then rolls to prone with minA and performs 1x10 hamstring curls. PT provides verbal and tactile cues for correct performance and NM feedback. Sit to supine with cues for positioning. Stand pivot back to WC with CGA. Pt left seated in WC with all needs within reach. ? ?Therapy Documentation ?Precautions:  ?Precautions ?Precautions: Fall ?Precaution Comments: LLE NWB ?Required Braces or  Orthoses: Other Brace ?Other Brace: L hinged knee brace, unlocked ?Restrictions ?Weight Bearing Restrictions: Yes ?LLE Weight Bearing: Non weight bearing ?Other Position/Activity Restrictions: No LLE ROM restrictions per acute ? ? ?Therapy/Group: Individual Therapy ? ?Breck Coons, PT, DPT ?04/17/2021, 3:51 PM  ?

## 2021-04-17 NOTE — Progress Notes (Signed)
Inpatient Rehabilitation  Patient information reviewed and entered into eRehab system by Mckensie Scotti M. Gardner Servantes, M.A., CCC/SLP, PPS Coordinator.  Information including medical coding, functional ability and quality indicators will be reviewed and updated through discharge.    

## 2021-04-17 NOTE — Discharge Summary (Incomplete)
Physician Discharge Summary  Patient ID: Tammy [name] MRN: [id number] DOB/AGE: October 17, 1955 66 y.o.  Admit date: 04/16/2021 Discharge date:   Discharge Diagnoses:  Principal Problem:   Hip fracture requiring operative repair [facility]) Active problems: Debility secondary to left distal femur fracture Hypokalemia Vitamin D deficiency Acute blood loss anemia Hypertension Breast cancer  Discharged Condition: {condition:18240}  Significant Diagnostic Studies: DG Knee 1-2 Views Right  Result Date: 04/10/2021 CLINICAL DATA:  Elective surgery EXAM: RIGHT KNEE - 1-2 VIEW COMPARISON:  None FINDINGS: Single AP view of the RIGHT knee was obtained intraoperatively. Osseous demineralization with mild degenerative changes. No fracture dislocation. IMPRESSION: Osseous demineralization RIGHT knee with mild scattered degenerative changes. Electronically Signed   By: Lavonia Dana M.D.   On: 04/10/2021 13:18   CT Knee Left Wo Contrast  Result Date: 04/08/2021 CLINICAL DATA:  Fall, left knee fracture EXAM: CT OF THE LEFT KNEE WITHOUT CONTRAST TECHNIQUE: Multidetector CT imaging of the left knee was performed according to the standard protocol. Multiplanar CT image reconstructions were also generated. RADIATION DOSE REDUCTION: This exam was performed according to the departmental dose-optimization program which includes automated exposure control, adjustment of the mA and/or kV according to patient size and/or use of iterative reconstruction technique. COMPARISON:  None. FINDINGS: Bones/Joint/Cartilage There is a markedly comminuted fracture of the distal femur identified. The dominant fracture plane extends transversely through the distal metadiaphysis and there is 1 shaft with posterior displacement, override and marked posterior angulation of the distal fracture fragments. Additionally, there is a major fracture plane that extends longitudinally into the intercondylar notch of the distal femur resulting in  separation of the femoral condyles which appear minimally displaced with resultant gap within the intercondylar notch. Fracture fragments appear aligned, however. Medial and lateral compartment joint spaces appear aligned. Patella, visualized tibia, and visualized fibula are intact. Several small intra-articular loose bodies are present measuring up to 9 mm. Superimposed mild to moderate tricompartmental degenerative arthritis of the knee with joint space narrowing and osteophyte formation, most severe within the medial compartment. Ligaments Suboptimally assessed by CT. Muscles and Tendons Unremarkable Soft tissues Large lipohemarthrosis is present. IMPRESSION: Markedly comminuted fracture of the distal right femur demonstrating both transverse and longitudinal dominant fracture planes with 1 shaft with posterior displacement, override, and posterior angulation of the distal fracture fragment consisting of the femoral condyles. Electronically Signed   By: Fidela Salisbury M.D.   On: 04/08/2021 22:04   DG Knee Complete 4 Views Left  Result Date: 04/08/2021 CLINICAL DATA:  Fall. EXAM: LEFT FEMUR 2 VIEWS; LEFT KNEE - COMPLETE 4+ VIEW COMPARISON:  None. FINDINGS: There is a comminuted and displaced fracture of the distal femoral metaphysis. There is posterior displacement of the distal fracture fragment in relation to the femoral shaft and approximately 3.5 cm overlap. There is impaction of the distal end of the proximal fracture fragment on the anterior aspect of the distal fracture fragment and on the patella. The bones are osteopenic. No dislocation. There is moderate arthritic changes of the left knee with tricompartmental narrowing. The soft tissue swelling of the knee. No radiopaque foreign object or soft tissue gas. IMPRESSION: 1. Comminuted and displaced fracture of the distal femoral metaphysis. No dislocation. 2. Osteopenia. 3. Moderate arthritic changes of the left knee. Electronically Signed   By: Anner Crete M.D.   On: 04/08/2021 20:10   DG C-Arm 1-60 Min-No Report  Result Date: 04/10/2021 Fluoroscopy was utilized by the requesting physician.  No radiographic interpretation.   DG  C-Arm 1-60 Min-No Report  Result Date: 04/10/2021 Fluoroscopy was utilized by the requesting physician.  No radiographic interpretation.   DG FEMUR MIN 2 VIEWS LEFT  Result Date: 04/10/2021 CLINICAL DATA:  ORIF distal LEFT femur fracture EXAM: LEFT FEMUR 2 VIEWS COMPARISON:  04/08/2021 FLUOROSCOPY TIME: 1 minute 26.4 seconds Dose: 6.82 mGy Images: 9 FINDINGS: Osseous demineralization. Comminuted distal LEFT femoral metadiaphyseal fracture again seen. Images demonstrate sequential placement of a lateral plate and multiple screws across the distal femoral fracture. Knee joint alignment normal. Patellofemoral degenerative changes. IMPRESSION: Post ORIF of comminuted distal LEFT femoral metadiaphyseal fracture. Electronically Signed   By: Lavonia Dana M.D.   On: 04/10/2021 13:17   DG Femur Min 2 Views Left  Result Date: 04/08/2021 CLINICAL DATA:  Fall. EXAM: LEFT FEMUR 2 VIEWS; LEFT KNEE - COMPLETE 4+ VIEW COMPARISON:  None. FINDINGS: There is a comminuted and displaced fracture of the distal femoral metaphysis. There is posterior displacement of the distal fracture fragment in relation to the femoral shaft and approximately 3.5 cm overlap. There is impaction of the distal end of the proximal fracture fragment on the anterior aspect of the distal fracture fragment and on the patella. The bones are osteopenic. No dislocation. There is moderate arthritic changes of the left knee with tricompartmental narrowing. The soft tissue swelling of the knee. No radiopaque foreign object or soft tissue gas. IMPRESSION: 1. Comminuted and displaced fracture of the distal femoral metaphysis. No dislocation. 2. Osteopenia. 3. Moderate arthritic changes of the left knee. Electronically Signed   By: Anner Crete M.D.   On: 04/08/2021 20:10    DG FEMUR PORT MIN 2 VIEWS LEFT  Result Date: 04/10/2021 CLINICAL DATA:  Status post LEFT femur ORIF EXAM: LEFT FEMUR PORTABLE 2 VIEWS COMPARISON:  April 08, 2021 FINDINGS: Osteopenia. Status post ORIF of the distal femur. Orthopedic hardware is intact and without new periprosthetic fracture or lucency. Revisualization of a comminuted fracture of the distal femur. There is improved alignment of the fracture fragments with decreased foreshortening. Soft tissue air consistent with recent surgical intervention. Air within the knee joint space consistent with surgical intervention. Moderate joint effusion. IMPRESSION: Expected postsurgical appearance status post ORIF of the LEFT distal femur. Electronically Signed   By: Valentino Saxon M.D.   On: 04/10/2021 14:20    Labs:  Basic Metabolic Panel: Recent Labs  Lab 04/11/21 0219 04/12/21 0231 04/15/21 0302 04/17/21 0548  NA 138 138 140 140  K 4.3 3.7 3.3* 3.6  CL 103 105 104 105  CO2 '23 26 26 26  '$ GLUCOSE 117* 106* 130* 110*  BUN 24* 30* 16 13  CREATININE 0.88 0.81 0.71 0.66  CALCIUM 9.0 8.8* 9.2 9.3    CBC: Recent Labs  Lab 04/14/21 1409 04/15/21 0302 04/17/21 0548  WBC 8.1 6.8 5.2  NEUTROABS  --   --  3.7  HGB 8.7* 8.2* 7.9*  HCT 26.9* 25.2* 25.0*  MCV 87.9 87.8 88.3  PLT 287 257 285    CBG: No results for input(s): GLUCAP in the last 168 hours.  Brief HPI:   Dajae Kizer is a 66 y.o. female who tripped on a concrete block in a shopping store parking lot on 04/08/2021.  She was unable to ambulate and a bystander called EMS.  She was brought to [facility] emergency department where imaging revealed distal left femur fracture.  Orthopedic surgery was consulted and she was transferred to [facility] due to operating room availability.  She was taken  to the operating room on 3/2 and underwent ORIF of left distal femur.   Hospital Course: Faven Watterson was admitted to rehab 04/16/2021 for inpatient  therapies to consist of PT, ST and OT at least three hours five days a week. Past admission physiatrist, therapy team and rehab RN have worked together to provide customized collaborative inpatient rehab.   Blood pressures were monitored on TID basis and    Rehab course: During patient's stay in rehab weekly team conferences were held to monitor patient's progress, set goals and discuss barriers to discharge. At admission, patient required min assist with basic self-care skills and mobility.  He/She  has had improvement in activity tolerance, balance, postural control as well as ability to compensate for deficits. He/She has had improvement in functional use RUE/LUE  and RLE/LLE as well as improvement in awareness       Disposition: Home There are no questions and answers to display.         Diet: Regular  Special Instructions:  No driving, alcohol consumption or tobacco use.   30-35 minutes were spent on discharge planning and discharge summary.  Allergies as of 04/17/2021       Reactions   Lisinopril Cough     Med Rec must be completed prior to using this [facility]***       Follow-up Information     Raulkar, Clide Deutscher, MD Follow up.   Specialty: Physical Medicine and Rehabilitation Why: 09/23/21 please arrive at 1:00pm for 1:20pm appointment, thank you! Contact information: [address] [place] [address] [phone]                 Signed: Barbie Banner 04/17/2021, 4:58 PM

## 2021-04-17 NOTE — Discharge Instructions (Signed)
Information on my medicine - ELIQUIS (apixaban)  Why was Eliquis prescribed for you? Eliquis was prescribed for you to reduce the risk of blood clots forming after orthopedic surgery.    What do You need to know about Eliquis? Take your Eliquis TWICE DAILY - one tablet in the morning and one tablet in the evening with or without food.  It would be best to take the dose about the same time each day.  If you have difficulty swallowing the tablet whole please discuss with your pharmacist how to take the medication safely.  Take Eliquis exactly as prescribed by your doctor and DO NOT stop taking Eliquis without talking to the doctor who prescribed the medication.  Stopping without other medication to take the place of Eliquis may increase your risk of developing a clot.  After discharge, you should have regular check-up appointments with your healthcare provider that is prescribing your Eliquis.  What do you do if you miss a dose? If a dose of ELIQUIS is not taken at the scheduled time, take it as soon as possible on the same day and twice-daily administration should be resumed.  The dose should not be doubled to make up for a missed dose.  Do not take more than one tablet of ELIQUIS at the same time.  Important Safety Information A possible side effect of Eliquis is bleeding. You should call your healthcare provider right away if you experience any of the following: Bleeding from an injury or your nose that does not stop. Unusual colored urine (red or dark brown) or unusual colored stools (red or black). Unusual bruising for unknown reasons. A serious fall or if you hit your head (even if there is no bleeding).  Some medicines may interact with Eliquis and might increase your risk of bleeding or clotting while on Eliquis. To help avoid this, consult your healthcare provider or pharmacist prior to using any new prescription or non-prescription medications, including herbals,  vitamins, non-steroidal anti-inflammatory drugs (NSAIDs) and supplements.  This website has more information on Eliquis (apixaban): http://www.eliquis.com/eliquis/home

## 2021-04-17 NOTE — Evaluation (Signed)
Occupational Therapy Assessment and Plan  Patient Details  Name: Tammy [name] MRN: [id number] Date of Birth: 12-Oct-1955  OT Diagnosis: abnormal posture, acute pain, and muscle weakness (generalized) Rehab Potential:   ELOS:  5-7 days   Today's Date: 04/17/2021 OT Individual Time: 1001-1054 OT Individual Time Calculation (min): 62 min     Hospital Problem: Principal Problem:   Hip fracture requiring operative repair [facility])   Past Medical History:  Past Medical History:  Diagnosis Date   Arthritis    Bipolar disorder ([place])    Breast cancer ([place])    Depression    Family history of breast cancer 07/10/2020   Family history of colon cancer 07/10/2020   Family history of ovarian cancer 07/10/2020   GERD (gastroesophageal reflux disease)    HTN (hypertension)    Stroke ([place]) 09/2012   slurred speech and left sided weakness/notes 10/04/2012   Past Surgical History:  Past Surgical History:  Procedure Laterality Date   BREAST LUMPECTOMY WITH RADIOACTIVE SEED AND SENTINEL LYMPH NODE BIOPSY Left 07/26/2020   Procedure: LEFT BREAST LUMPECTOMY WITH RADIOACTIVE SEED AND SENTINEL LYMPH NODE BIOPSY;  Surgeon: [name], MD;  Location: [place];  Service: General;  Laterality: Left;   [place]   fracture of leg     2 months ago   ORIF FEMUR FRACTURE Left 04/10/2021   Procedure: OPEN REDUCTION INTERNAL FIXATION (ORIF) DISTAL FEMUR FRACTURE;  Surgeon: Altamese Santa Nella, MD;  Location: [place];  Service: Orthopedics;  Laterality: Left;    Assessment & Plan Clinical Impression: Tammy [name] is a 66 year old female who was in her usual state of good health when she fell in a store parking lot on 04/08/2021.  She tripped on a concrete block and needed help getting up from a bystander who called EMS.  She was brought to [facility] emergency department where imaging revealed comminuted and displaced fracture of distal left femur metaphysis.  Orthopedic  surgery was consulted and recommended transfer to [facility] due to lack of operating room availability at [facility]. She had no other injuries. She was on no blood thinners. Dr. Marcelino Scot was consulted and the patient was taken to the OR on 3/2 and underwent ORIF of left distal femur with intercondylar extension.  Found to be hypokalemic on follow-up blood work yesterday and this was repleted.  Anemia noted with hemoglobin of 8.2.  Iron studies pending.Patient transferred to CIR on 04/16/2021 .    Patient currently requires min with basic self-care skills secondary to muscle weakness, decreased cardiorespiratoy endurance, decreased coordination and decreased motor planning, decreased motor planning, and decreased standing balance, decreased postural control, and decreased balance strategies.  Prior to hospitalization, patient could complete BADL and IADL with modified independent .  Patient will benefit from skilled intervention to decrease level of assist with basic self-care skills and increase independence with basic self-care skills prior to discharge home with care partner.  Anticipate patient will require intermittent supervision and no further OT follow recommended.  OT - End of Session Activity Tolerance: Tolerates 30+ min activity with multiple rests Endurance Deficit: Yes OT Assessment OT Patient demonstrates impairments in the following area(s): Balance;Endurance;Motor;Pain;Perception;Safety;Skin Integrity OT Basic ADL's Functional Problem(s): Grooming;Bathing;Dressing;Toileting OT Transfers Functional Problem(s): Toilet;Tub/Shower OT Additional Impairment(s): None OT Plan OT Intensity: Minimum of 1-2 x/day, 45 to 90 minutes OT Frequency: 5 out of 7 days OT Treatment/Interventions: Balance/vestibular training;Discharge planning;Functional electrical stimulation;Therapeutic Activities;Pain management;Self Care/advanced ADL retraining;UE/LE Coordination  activities;Visual/perceptual remediation/compensation;Therapeutic Exercise;Skin care/wound  managment;Patient/family education;Functional mobility training;Disease mangement/prevention;Cognitive remediation/compensation;Community reintegration;DME/adaptive equipment instruction;Neuromuscular re-education;Psychosocial support;Splinting/orthotics;UE/LE Strength taining/ROM;Wheelchair propulsion/positioning OT Self Feeding Anticipated Outcome(s): no goal OT Basic Self-Care Anticipated Outcome(s): Mod I OT Toileting Anticipated Outcome(s): Mod I OT Bathroom Transfers Anticipated Outcome(s): Mod I OT Recommendation Patient destination: Home Follow Up Recommendations: None Equipment Recommended: To be determined   OT Evaluation Precautions/Restrictions  Precautions Precautions: Fall Precaution Comments: LLE NWB Required Braces or Orthoses: Other Brace Other Brace: L hinged knee brace, unlocked Restrictions Weight Bearing Restrictions: Yes LLE Weight Bearing: Non weight bearing Other Position/Activity Restrictions: No LLE ROM restrictions per acute Pain Pain Assessment Pain Scale: 0-10 Pain Score: 4  Home Living/Prior Functioning Home Living Family/patient expects to be discharged to:: Private residence Living Arrangements: Spouse/significant other Available Help at Discharge: Family Type of Home: Mobile home Home Layout: One level Bathroom Shower/Tub: Chiropodist: Standard  Lives With: Significant other Prior Function Level of Independence: Independent with basic ADLs, Independent with homemaking with ambulation, Independent with gait, Independent with transfers Driving: Yes Vision Baseline Vision/History: 0 No visual deficits Ability to See in Adequate Light: 0 Adequate Patient Visual Report: No change from baseline Vision Assessment?: No apparent visual deficits Perception  Perception: Within Functional Limits Praxis Praxis: Intact Cognition Overall  Cognitive Status: Within Functional Limits for tasks assessed Arousal/Alertness: Awake/alert Orientation Level: Person;Place;Situation Person: Oriented Place: Oriented Situation: Oriented Year: 2023 Month: March Day of Week: Correct Memory: Appears intact Immediate Memory Recall: Sock;Blue;Bed Memory Recall Sock: Without Cue Memory Recall Blue: Without Cue Memory Recall Bed: Without Cue Awareness: Appears intact Problem Solving: Appears intact Safety/Judgment: Appears intact Sensation Sensation Light Touch: Appears Intact Coordination Gross Motor Movements are Fluid and Coordinated: No Fine Motor Movements are Fluid and Coordinated: Yes Coordination and Movement Description: grossly uncoordinated 2/2 NWB status Motor  Motor Motor: Other (comment) (grossly uncoordinated 2/2 WB status) Motor - Skilled Clinical Observations: grossly uncoordinated 2/2 LLE NWB  Trunk/Postural Assessment  Cervical Assessment Cervical Assessment: Within Functional Limits Thoracic Assessment Thoracic Assessment: Within Functional Limits Lumbar Assessment Lumbar Assessment: Within Functional Limits Postural Control Postural Control: Within Functional Limits  Balance Balance Balance Assessed: Yes Dynamic Sitting Balance Dynamic Sitting - Balance Support: During functional activity Dynamic Sitting - Level of Assistance: 6: Modified independent (Device/Increase time) Dynamic Sitting - Balance Activities: Lateral lean/weight shifting;Forward lean/weight shifting;Reaching for objects Static Standing Balance Static Standing - Balance Support: During functional activity Static Standing - Level of Assistance: 5: Stand by assistance Dynamic Standing Balance Dynamic Standing - Balance Support: During functional activity Dynamic Standing - Level of Assistance: 4: Min assist Dynamic Standing - Balance Activities: Lateral lean/weight shifting;Forward lean/weight shifting;Reaching for  objects Extremity/Trunk Assessment RUE Assessment RUE Assessment: Within Functional Limits LUE Assessment LUE Assessment: Within Functional Limits  Care Tool Care Tool Self Care Eating   Eating Assist Level: Independent    Oral Care    Oral Care Assist Level: Independent with assistive device    Bathing   Body parts bathed by patient: Right arm;Left arm;Chest;Abdomen;Front perineal area;Buttocks;Right upper leg;Left upper leg;Face;Right lower leg (simulated) Body parts bathed by helper: Left lower leg   Assist Level: Minimal Assistance - Patient > 75%    Upper Body Dressing(including orthotics)   What is the patient wearing?: Pull over shirt   Assist Level: Set up assist    Lower Body Dressing (excluding footwear)   What is the patient wearing?: Pants Assist for lower body dressing: Minimal Assistance - Patient > 75%    Putting on/Taking off footwear  MOD A         Care Tool Toileting Toileting activity   Assist for toileting: Minimal Assistance - Patient > 75%     Care Tool Bed Mobility Roll left and right activity    CGA    Sit to lying activity    CGA    Lying to sitting on side of bed activity    CGA     Care Tool Transfers Sit to stand transfer   Sit to stand assist level: Minimal Assistance - Patient > 75%    Chair/bed transfer   Chair/bed transfer assist level: Minimal Assistance - Patient > 75%     Toilet transfer   Assist Level: Minimal Assistance - Patient > 75%     Care Tool Cognition  Expression of Ideas and Wants Expression of Ideas and Wants: 4. Without difficulty (complex and basic) - expresses complex messages without difficulty and with speech that is clear and easy to understand  Understanding Verbal and Non-Verbal Content Understanding Verbal and Non-Verbal Content: 4. Understands (complex and basic) - clear comprehension without cues or repetitions   Memory/Recall Ability Memory/Recall Ability : That he or she is in a  hospital/hospital unit;Location of own room;Current season   Refer to Care Plan for [place] 1 OT Short Term Goal 1 (Week 1): STG = LTG 2/2 ELOS  Recommendations for other services: None    Skilled Therapeutic Intervention ADL ADL Eating: Independent Grooming: Setup Upper Body Bathing: Setup Lower Body Bathing: Minimal assistance Upper Body Dressing: Setup Lower Body Dressing: Minimal assistance Toileting: Minimal assistance Toilet Transfer: Minimal assistance;Contact guard Toilet Transfer Method: Ambulating Mobility  Transfers Sit to Stand: Minimal Assistance - Patient > 75% Stand to Sit: Minimal Assistance - Patient > 75%    Skilled Interventions: Pt greeted at time of session sitting up in wheelchair agreeable to OT session requesting pain medication at end of session and nursing aware, did not rate pain. Focus of session on simulated bathing tasks with Min A, wanting to shower/bathe at a later time. Ambulatory toilet transfer approx 15 feet hopping and good adherence to NWB on LLE with Min/CGA to bathroom, Min A for toileting and techniques for alternating UE support and safety tips. Discussion throughout session regarding DC planning and DME needs as well. Donned shirt with set up and pants with Min A to thread LLE. Transported to ADL apartment and demonstrated TTB transfer as well as pt does have one at home but didn't use before. Back in room, stand pivot to bed CGA/Min 2/2 fatigue. Alarmon call bell in reach.    Discharge Criteria: Patient will be discharged from OT if patient refuses treatment 3 consecutive times without medical reason, if treatment goals not met, if there is a change in medical status, if patient makes no progress towards goals or if patient is discharged from hospital.  The above assessment, treatment plan, treatment alternatives and goals were discussed and mutually agreed upon: by patient  Viona Gilmore 04/17/2021, 12:25  PM

## 2021-04-17 NOTE — Evaluation (Signed)
Physical Therapy Assessment and Plan  Patient Details  Name: Tammy [name] MRN: [id number] Date of Birth: Jun 15, 1955  PT Diagnosis: Abnormality of gait, Difficulty walking, Edema, Muscle weakness, and Pain in joint Rehab Potential: Excellent ELOS: 5-7 days   Today's Date: 04/17/2021 PT Individual Time: 0800-0900 PT Individual Time Calculation (min): 60 min    Hospital Problem: Principal Problem:   Hip fracture requiring operative repair [facility])   Past Medical History:  Past Medical History:  Diagnosis Date   Arthritis    Bipolar disorder ([place])    Breast cancer ([place])    Depression    Family history of breast cancer 07/10/2020   Family history of colon cancer 07/10/2020   Family history of ovarian cancer 07/10/2020   GERD (gastroesophageal reflux disease)    HTN (hypertension)    Stroke ([place]) 09/2012   slurred speech and left sided weakness/notes 10/04/2012   Past Surgical History:  Past Surgical History:  Procedure Laterality Date   BREAST LUMPECTOMY WITH RADIOACTIVE SEED AND SENTINEL LYMPH NODE BIOPSY Left 07/26/2020   Procedure: LEFT BREAST LUMPECTOMY WITH RADIOACTIVE SEED AND SENTINEL LYMPH NODE BIOPSY;  Surgeon: Tammy Keens, MD;  Location: [place];  Service: General;  Laterality: Left;   [place]   fracture of leg     2 months ago   ORIF FEMUR FRACTURE Left 04/10/2021   Procedure: OPEN REDUCTION INTERNAL FIXATION (ORIF) DISTAL FEMUR FRACTURE;  Surgeon: Tammy Union, MD;  Location: [place];  Service: Orthopedics;  Laterality: Left;    Assessment & Plan Clinical Impression: Patient is a 66 y.o. year old female who was in her usual state of good health when she fell in a store parking lot on 04/08/2021.  She tripped on a concrete block and needed help getting up from a bystander who called EMS.  She was brought to [facility] emergency department where imaging revealed comminuted and displaced fracture of distal left femur  metaphysis.  Orthopedic surgery was consulted and recommended transfer to [facility] due to lack of operating room availability at [facility]. She had no other injuries. She was on no blood thinners. Dr. Marcelino [name] was consulted and the patient was taken to the OR on 3/2 and underwent ORIF of left distal femur with intercondylar extension.  Found to be hypokalemic on follow-up blood work yesterday and this was repleted.  Anemia noted with hemoglobin of 8.2.  Iron studies pending.   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 Jaynee [name].  History of left breast cancer 2022 status postlumpectomy, radiation therapy on anastrozole, history of liver cirrhosis due to to chronic hepatitis C virus and is followed by Tammy [name]. History of hypertension and bipolar disorder. Patient transferred to [facility] on 04/16/2021 .   Patient currently requires min with mobility secondary to muscle weakness and muscle joint tightness, decreased cardiorespiratoy endurance, and decreased standing balance, decreased postural control, and decreased balance strategies.  Prior to hospitalization, patient was independent  with mobility and lived with Significant other in a Mobile home home.  Home access is 4-5Stairs to enter.  Patient will benefit from skilled PT intervention to maximize safe functional mobility, minimize fall risk, and decrease caregiver burden for planned discharge home with intermittent assist.  Anticipate patient will benefit from follow up [facility] at discharge.  PT - End of Session Activity Tolerance: Tolerates 30+ min activity with multiple rests Endurance Deficit: Yes PT Assessment Rehab Potential (  ACUTE/IP ONLY): Excellent PT Barriers to Discharge: Home environment access/layout;Weight bearing restrictions PT Patient demonstrates impairments in the following area(s): Balance;Safety;Edema;Endurance;Skin  Integrity;Motor;Nutrition;Pain PT Transfers Functional Problem(s): Bed Mobility;Bed to Chair;Car;Furniture PT Locomotion Functional Problem(s): Ambulation;Wheelchair Mobility;Stairs PT Plan PT Intensity: Minimum of 1-2 x/day ,45 to 90 minutes PT Frequency: 5 out of 7 days PT Duration Estimated Length of Stay: 5-7 days PT Treatment/Interventions: Ambulation/gait training;Discharge planning;DME/adaptive equipment instruction;Functional mobility training;Pain management;Psychosocial support;Splinting/orthotics;Therapeutic Activities;UE/LE Strength taining/ROM;Wheelchair propulsion/positioning;UE/LE Coordination activities;Therapeutic Exercise;Stair training;Skin care/wound management;Patient/family education;Neuromuscular re-education;Functional electrical stimulation;Disease management/prevention;Community reintegration;Balance/vestibular training PT Transfers Anticipated Outcome(s): mod I using LRAD PT Locomotion Anticipated Outcome(s): Supervision 50 ft using LRAD PT Recommendation Follow Up Recommendations: Home health PT Patient destination: Home Equipment Recommended: Rolling walker with 5" wheels;Wheelchair cushion (measurements) Equipment Details: 18"x16" light weight wheelchair with standard cushion   PT Evaluation Precautions/Restrictions Precautions Precautions: Fall Precaution Comments: LLE NWB Required Braces or Orthoses: Other Brace Other Brace: L hinged knee brace, unlocked Restrictions Weight Bearing Restrictions: Yes LLE Weight Bearing: Non weight bearing Other Position/Activity Restrictions: No LLE ROM restrictions per acute Pain Interference Pain Interference Pain Effect on Sleep: 2. Occasionally Pain Interference with Therapy Activities: 1. Rarely or not at all Pain Interference with Day-to-Day Activities: 1. Rarely or not at all Home Living/Prior [place] Available Help at Discharge: Family Type of Home: Mobile home Home Access: Stairs to  enter Entrance Stairs-Number of Steps: 4-5 Home Layout: One level Bathroom Shower/Tub: Chiropodist: Standard  Lives With: Significant other Prior Function Level of Independence: Independent with basic ADLs;Independent with homemaking with ambulation;Independent with gait;Independent with transfers Driving: Yes Vision/Perception  Vision - History Ability to See in Adequate Light: 0 Adequate Perception Perception: Within Functional Limits Praxis Praxis: Intact  Cognition Overall Cognitive Status: Within Functional Limits for tasks assessed Arousal/Alertness: Awake/alert Orientation Level: Oriented X4 Year: 2023 Month: March Day of Week: Correct Memory: Appears intact Immediate Memory Recall: Sock;Blue;Bed Memory Recall Sock: Without Cue Memory Recall Blue: Without Cue Memory Recall Bed: Without Cue Awareness: Appears intact Problem Solving: Appears intact Safety/Judgment: Appears intact Sensation Sensation Light Touch: Appears Intact Proprioception: Appears Intact Coordination Gross Motor Movements are Fluid and Coordinated: No Fine Motor Movements are Fluid and Coordinated: Yes Coordination and Movement Description: grossly uncoordinated 2/2 NWB status Motor  Motor Motor: Other (comment) (grossly uncoordinated 2/2 WB status) Motor - Skilled Clinical Observations: grossly uncoordinated 2/2 LLE NWB   Trunk/Postural Assessment  Cervical Assessment Cervical Assessment: Within Functional Limits Thoracic Assessment Thoracic Assessment: Within Functional Limits Lumbar Assessment Lumbar Assessment: Within Functional Limits Postural Control Postural Control: Within Functional Limits  Balance Balance Balance Assessed: Yes Dynamic Sitting Balance Dynamic Sitting - Balance Support: During functional activity Dynamic Sitting - Level of Assistance: 6: Modified independent (Device/Increase time) Dynamic Sitting - Balance Activities: Lateral lean/weight  shifting;Forward lean/weight shifting;Reaching for objects Static Standing Balance Static Standing - Balance Support: During functional activity Static Standing - Level of Assistance: 5: Stand by assistance Dynamic Standing Balance Dynamic Standing - Balance Support: During functional activity Dynamic Standing - Level of Assistance: 4: Min assist Dynamic Standing - Balance Activities: Lateral lean/weight shifting;Forward lean/weight shifting;Reaching for objects Extremity Assessment  RUE Assessment RUE Assessment: Within Functional Limits LUE Assessment LUE Assessment: Within Functional Limits RLE Assessment RLE Assessment: Within Functional Limits Active Range of Motion (AROM) Comments: WFL for all functional mobility General Strength Comments: Grossly 5/5 throughout in sitting LLE Assessment LLE Assessment: Exceptions to [facility] Passive Range of Motion (PROM) Comments: DF ROM 12 deg, knee flexion 50  deg, knee extension lacking 5 deg General Strength Comments: Grossly 3/5 throughout in available range  Care Tool Care Tool Bed Mobility Roll left and right activity   Roll left and right assist level: Supervision/Verbal cueing    Sit to lying activity   Sit to lying assist level: Supervision/Verbal cueing    Lying to sitting on side of bed activity   Lying to sitting on side of bed assist level: the ability to move from lying on the back to sitting on the side of the bed with no back support.: Supervision/Verbal cueing     Care Tool Transfers Sit to stand transfer   Sit to stand assist level: Minimal Assistance - Patient > 75%    Chair/bed transfer   Chair/bed transfer assist level: Minimal Assistance - Patient > 75%     Toilet transfer   Assist Level: Minimal Assistance - Patient > 75%    Car transfer   Car transfer assist level: Minimal Assistance - Patient > 75%      Care Tool Locomotion Ambulation   Assist level: Minimal Assistance - Patient > 75% Assistive device:  Walker-rolling Max distance: 15 ft  Walk 10 feet activity   Assist level: Minimal Assistance - Patient > 75% Assistive device: Walker-rolling   Walk 50 feet with 2 turns activity Walk 50 feet with 2 turns activity did not occur: Safety/medical concerns (decreased endurance)      Walk 150 feet activity Walk 150 feet activity did not occur: Safety/medical concerns      Walk 10 feet on uneven surfaces activity Walk 10 feet on uneven surfaces activity did not occur: Safety/medical concerns      Stairs Stair activity did not occur: Safety/medical concerns (decreased strength/endurance)        Walk up/down 1 step activity Walk up/down 1 step or curb (drop down) activity did not occur: Safety/medical concerns      Walk up/down 4 steps activity Walk up/down 4 steps activity did not occur: Safety/medical concerns      Walk up/down 12 steps activity Walk up/down 12 steps activity did not occur: Safety/medical concerns      Pick up small objects from floor Pick up small object from the floor (from standing position) activity did not occur: Safety/medical concerns      Wheelchair Is the patient using a wheelchair?: Yes Type of Wheelchair: Manual   Wheelchair assist level: Supervision/Verbal cueing Max wheelchair distance: 65 ft  Wheel 50 feet with 2 turns activity   Assist Level: Supervision/Verbal cueing  Wheel 150 feet activity   Assist Level: Maximal Assistance - Patient 25 - 49%    Refer to Care Plan for Long Term Goals  SHORT TERM GOAL WEEK 1 PT Short Term Goal 1 (Week 1): STG=LTG due to ELOS.  Recommendations for other services: None   Skilled Therapeutic Intervention Evaluation completed (see details above and below) with education on PT POC and goals and individual treatment initiated with focus on functional mobility/transfers, LE strength, dynamic standing balance/coordination, ambulation, stair navigation, simulated car transfers, and improved endurance with  activity Patient provided with 18"x16" wheelchair with standard cushion and adjustments made to promote optimal seating posture and pressure distribution. Patient also provided with youth RW for use in room and therapist adjusted to proper height for patient.  Patient in bed with L thigh high TED hose and L Bledsoe brace donned upon PT arrival. Patient alert and agreeable to PT session. Patient reported 3-5/10 L lower extremity pain during session, RN  made aware. PT provided repositioning, rest breaks, and distraction as pain interventions throughout session.   Therapeutic Activity: Bed Mobility: Patient rolling R/L and supine to/from sit with supervision on a mat table for cues for technique to minimize L lower extremity pain.  Transfers: Patient performed sit to/from stand x2 with min A progressing to CGA using a RW. Provided verbal cues for hand placement, forward and R weight shift to stand, and reaching back to sit. She performed a squat pivot transfer with supervision following PT demonstration.   Gait Training:  Patient ambulated 15 feet using RW with CGA and min A x1 for minor LOB. Ambulated with hop-to gait pattern on R, maintained L lower extremity NWB throughout. Provided verbal cues for scapular retraction and depression and increased triceps and latissimus activation to clear her R foot from the floor. Demonstrated going up/down steps using a shower chair to maintain L lower extremity NWB precautions. Patient to discuss retrieving a used shower chair for stair management at d/c.   Wheelchair Mobility:  Patient propelled wheelchair 65 feet and >50 feet with supervision. Provided verbal cues for propulsion and turning technique. Educated on use of breaks and total A required leg rest management.   Instructed pt in results of PT evaluation as detailed above, PT POC, rehab potential, rehab goals, and discharge recommendations. Additionally discussed [facility]'s policies regarding fall safety and  use of chair alarm and/or quick release belt. Pt verbalized understanding and in agreement. Will update pt's family members as they become available.   Patient in w/c in the room at end of session with breaks locked, seat belt alarm set, and all needs within reach.   Discharge Criteria: Patient will be discharged from PT if patient refuses treatment 3 consecutive times without medical reason, if treatment goals not met, if there is a change in medical status, if patient makes no progress towards goals or if patient is discharged from hospital.  The above assessment, treatment plan, treatment alternatives and goals were discussed and mutually agreed upon: by patient  Doreene Burke PT, DPT  04/17/2021, 12:54 PM

## 2021-04-17 NOTE — Plan of Care (Signed)
?  Problem: RH Balance ?Goal: LTG Patient will maintain dynamic standing with ADLs (OT) ?Description: LTG:  Patient will maintain dynamic standing balance with assist during activities of daily living (OT)  ?Flowsheets (Taken 04/17/2021 1235) ?LTG: Pt will maintain dynamic standing balance during ADLs with: Independent with assistive device ?  ?Problem: Sit to Stand ?Goal: LTG:  Patient will perform sit to stand in prep for activites of daily living with assistance level (OT) ?Description: LTG:  Patient will perform sit to stand in prep for activites of daily living with assistance level (OT) ?Flowsheets (Taken 04/17/2021 1235) ?LTG: PT will perform sit to stand in prep for activites of daily living with assistance level: Independent with assistive device ?  ?Problem: RH Grooming ?Goal: LTG Patient will perform grooming w/assist,cues/equip (OT) ?Description: LTG: Patient will perform grooming with assist, with/without cues using equipment (OT) ?Flowsheets (Taken 04/17/2021 1235) ?LTG: Pt will perform grooming with assistance level of: Independent with assistive device  ?  ?Problem: RH Bathing ?Goal: LTG Patient will bathe all body parts with assist levels (OT) ?Description: LTG: Patient will bathe all body parts with assist levels (OT) ?Flowsheets (Taken 04/17/2021 1235) ?LTG: Pt will perform bathing with assistance level/cueing: Independent with assistive device  ?  ?Problem: RH Dressing ?Goal: LTG Patient will perform upper body dressing (OT) ?Description: LTG Patient will perform upper body dressing with assist, with/without cues (OT). ?Flowsheets (Taken 04/17/2021 1235) ?LTG: Pt will perform upper body dressing with assistance level of: Independent with assistive device ?Goal: LTG Patient will perform lower body dressing w/assist (OT) ?Description: LTG: Patient will perform lower body dressing with assist, with/without cues in positioning using equipment (OT) ?Flowsheets (Taken 04/17/2021 1235) ?LTG: Pt will perform lower  body dressing with assistance level of: Independent with assistive device ?  ?Problem: RH Toileting ?Goal: LTG Patient will perform toileting task (3/3 steps) with assistance level (OT) ?Description: LTG: Patient will perform toileting task (3/3 steps) with assistance level (OT)  ?Flowsheets (Taken 04/17/2021 1235) ?LTG: Pt will perform toileting task (3/3 steps) with assistance level: Independent with assistive device ?  ?Problem: RH Toilet Transfers ?Goal: LTG Patient will perform toilet transfers w/assist (OT) ?Description: LTG: Patient will perform toilet transfers with assist, with/without cues using equipment (OT) ?Flowsheets (Taken 04/17/2021 1235) ?LTG: Pt will perform toilet transfers with assistance level of: Independent with assistive device ?  ?Problem: RH Tub/Shower Transfers ?Goal: LTG Patient will perform tub/shower transfers w/assist (OT) ?Description: LTG: Patient will perform tub/shower transfers with assist, with/without cues using equipment (OT) ?Flowsheets (Taken 04/17/2021 1235) ?LTG: Pt will perform tub/shower stall transfers with assistance level of: Independent with assistive device ?  ?

## 2021-04-18 MED ORDER — MAGNESIUM GLUCONATE 500 MG PO TABS
500.0000 mg | ORAL_TABLET | Freq: Every day | ORAL | Status: DC
  Administered 2021-04-18 – 2021-04-21 (×4): 500 mg via ORAL
  Filled 2021-04-18 (×4): qty 1

## 2021-04-18 MED ORDER — ACETAMINOPHEN 325 MG PO TABS
650.0000 mg | ORAL_TABLET | Freq: Three times a day (TID) | ORAL | Status: DC
  Administered 2021-04-18 – 2021-04-19 (×3): 650 mg via ORAL
  Filled 2021-04-18 (×4): qty 2

## 2021-04-18 NOTE — Progress Notes (Signed)
?                                                       PROGRESS NOTE ? ? ?Subjective/Complaints: ?No new complaints this morning ?Chest soreness feels better after 2 applications of Bengay.  ?Discussed increasing scheduled tylenol ? ?ROS: +LLE pain ? ? ?Objective: ?  ?No results found. ?Recent Labs  ?  04/17/21 ?7408  ?WBC 5.2  ?HGB 7.9*  ?HCT 25.0*  ?PLT 285  ? ?Recent Labs  ?  04/17/21 ?1448  ?NA 140  ?K 3.6  ?CL 105  ?CO2 26  ?GLUCOSE 110*  ?BUN 13  ?CREATININE 0.66  ?CALCIUM 9.3  ? ? ?Intake/Output Summary (Last 24 hours) at 04/18/2021 1022 ?Last data filed at 04/18/2021 0827 ?Gross per 24 hour  ?Intake 360 ml  ?Output --  ?Net 360 ml  ?  ? ?  ? ?Physical Exam: ?Vital Signs ?Blood pressure (!) 141/69, pulse 65, temperature 98.7 ?F (37.1 ?C), temperature source Oral, resp. rate 18, height '5\' 2"'$  (1.575 m), weight 71.6 kg, SpO2 97 %. ?Gen: no distress, normal appearing, BMI 28.87 ?HEENT: oral mucosa pink and moist, NCAT ?Cardiovascular:  ?   Rate and Rhythm: Normal rate and regular rhythm.  ?   Comments: Systolic murmur ?Pulmonary:  ?   Effort: Pulmonary effort is normal.  ?   Breath sounds: Normal breath sounds.  ?Abdominal:  ?   General: Bowel sounds are normal. There is no distension.  ?   Palpations: Abdomen is soft.  ?   Tenderness: There is no abdominal tenderness.  ?Musculoskeletal:  ?   Comments: Left surgical incisional dressing intact; TED hose in place; LLE hinged brace in place  ?Skin: ?   General: Skin is warm and dry.  ?Neurological:  ?   Mental Status: She is alert. Mental status is at baseline.  ? ? ?Assessment/Plan: ?1. Functional deficits which require 3+ hours per day of interdisciplinary therapy in a comprehensive inpatient rehab setting. ?Physiatrist is providing close team supervision and 24 hour management of active medical problems listed below. ?Physiatrist and rehab team continue to assess barriers to discharge/monitor patient progress toward functional and medical goals ? ?Care  Tool: ? ?Bathing ?   ?Body parts bathed by patient: Right arm, Left arm, Chest, Abdomen, Front perineal area, Buttocks, Right upper leg, Left upper leg, Face, Right lower leg (simulated)  ? Body parts bathed by helper: Left lower leg ?  ?  ?Bathing assist Assist Level: Minimal Assistance - Patient > 75% ?  ?  ?Upper Body Dressing/Undressing ?Upper body dressing Upper body dressing/undressing activity did not occur (including orthotics): N/A ?What is the patient wearing?: Pull over shirt ?   ?Upper body assist Assist Level: Set up assist ?   ?Lower Body Dressing/Undressing ?Lower body dressing ? ? ? Lower body dressing activity did not occur: N/A ?What is the patient wearing?: Ballinger only ? ?  ? ?Lower body assist Assist for lower body dressing: Minimal Assistance - Patient > 75% ?   ? ?Toileting ?Toileting Toileting Activity did not occur (Probation officer and hygiene only): N/A (no void or bm)  ?Toileting assist Assist for toileting: Minimal Assistance - Patient > 75% ?  ?  ?Transfers ?Chair/bed transfer ? ?Transfers assist ?   ? ?Chair/bed transfer assist level: Minimal Assistance -  Patient > 75% ?  ?  ?Locomotion ?Ambulation ? ? ?Ambulation assist ? ?   ? ?Assist level: Minimal Assistance - Patient > 75% ?Assistive device: Walker-rolling ?Max distance: 15 ft  ? ?Walk 10 feet activity ? ? ?Assist ?   ? ?Assist level: Minimal Assistance - Patient > 75% ?Assistive device: Walker-rolling  ? ?Walk 50 feet activity ? ? ?Assist Walk 50 feet with 2 turns activity did not occur: Safety/medical concerns (decreased endurance) ? ?  ?   ? ? ?Walk 150 feet activity ? ? ?Assist Walk 150 feet activity did not occur: Safety/medical concerns ? ?  ?  ?  ? ?Walk 10 feet on uneven surface  ?activity ? ? ?Assist Walk 10 feet on uneven surfaces activity did not occur: Safety/medical concerns ? ? ?  ?   ? ?Wheelchair ? ? ? ? ?Assist Is the patient using a wheelchair?: Yes ?Type of Wheelchair: Manual ?  ? ?Wheelchair assist  level: Supervision/Verbal cueing ?Max wheelchair distance: 65 ft  ? ? ?Wheelchair 50 feet with 2 turns activity ? ? ? ?Assist ? ?  ?  ? ? ?Assist Level: Supervision/Verbal cueing  ? ?Wheelchair 150 feet activity  ? ? ? ?Assist ?   ? ? ?Assist Level: Maximal Assistance - Patient 25 - 49%  ? ?Blood pressure (!) 141/69, pulse 65, temperature 98.7 ?F (37.1 ?C), temperature source Oral, resp. rate 18, height '5\' 2"'$  (1.575 m), weight 71.6 kg, SpO2 97 %. ? ? ?Medical Problem List and Plan: ?1. Functional deficits secondary to left femur fracture ?            -patient may shower but incisions must be covered ?            -ELOS/Goals: 10-12 days S ?           HFU scheduled ?2.  Antithrombotics: ?-DVT/anticoagulation:  Pharmaceutical: Other (comment) apixaban 2.5 mg twice daily.  Anticipated course of 6 weeks per ortho ?            -antiplatelet therapy: None ?3. Postoperative pain: Tylenol, oxycodone as needed. Asked nursing to bring her an oxycodone now.  ?-- Neurontin 100 mg twice daily ?--Ice and elevate for swelling/pain control ?Increase scheduled tylenol to '650mg'$  TID ?4. Mood: LCSW to evaluate and provide emotional support ?            -antipsychotic agents: None ?5. Neuropsych: This patient is capable of making decisions on her own behalf. ?6. Skin/Wound Care: Routine skin care checks ?            -- Monitor surgical incision ?            -- Nylon sutures in place; remove approximately 3/16 ?            -- Dressing changes as needed ?7. Fluids/Electrolytes/Nutrition: Routine I's and O's and follow-up chemistries ?8: Hypokalemia: improved with supplementation, advised regarding high potassium foods ?9: Vitamin D deficiency: Level 10.88 on 04/11/21.Continue ergocalciferol 50,000U once per week for 7 weeks. Discontinue BID 2,000U supplement.  ?            -- Ortho recommending DEXA scan in next 4 to 8 weeks ?            -- Referral to osteoporosis clinic ?10: Acute blood loss anemia: 11.4>>8.2. Iron panel reviewed and  stable.  ?11: Hypertension: Continue amlodipine, hydralazine. Increase magnesium gluconate to 500 mg HS ?12: Recent treatment for breast cancer: Continue Arimidex daily ?13:  Left femur fracture status post ORIF: ?            -- Hinged brace in place  ?            -- Nonweightbearing left lower extremity x 6 to 8 weeks ?            -- Unrestricted range of motion left knee in hinged brace (this can be off when in bed) ?            --May work on range of motion without brace with therapist supervision ? -discussed best to keep compression garment in place to reduce swelling ?14. Overweight: BMI 28.87: provide dietary education ? ?LOS: ?2 days ?A FACE TO FACE EVALUATION WAS PERFORMED ? ?Martha Clan P Rodger Giangregorio ?04/18/2021, 10:22 AM  ? ?  ?

## 2021-04-18 NOTE — Progress Notes (Signed)
Physical Therapy Session Note ? ?Patient Details  ?Name: Tammy [name] ?MRN: [id number] ?Date of Birth: November 09, 1955 ? ?Today's Date: 04/18/2021 ?PT Individual Time: 4782-9562 and 1308-6578 ?PT Individual Time Calculation (min): 25 min and 53 min ? ?Short Term Goals: ?Week 1:  PT Short Term Goal 1 (Week 1): STG=LTG due to ELOS. ? ?Skilled Therapeutic Interventions/Progress Updates:  ?Treatment Session 1  ?Received pt sitting in WC, pt agreeable to PT treatment, and did not state pain level during session. Session with emphasis on functional mobility/transfers, generalized strengthening, dynamic standing balance/coordination, and gait training. Pt performed WC mobility 162f using BUE and supervision to main therapy gym and adjusted L bledsoe brace as it was sliding down. Sit<>stand with RW and CGA and pt ambulated 152fx 2 trials with RW and CGA - demonstrating great adherance to LLE NWB precautions. Discussed equipment for D/C with overall recommendations for youth RW and WC - will notify CSW. Pt reports biggest barrier is 5 STE with 2 rails but cannot reach both - plan to further discuss stair navigation techniques this afternoon. [facility] and concluded session with pt sitting in WC, needs within reach, and seatbelt alarm on.  ? ?Treatment Session 2 ?Received pt sitting in WC, pt agreeable to PT treatment, and reported pain 5/10 in RLE during session (premedicated) and urge to use restroom. Session with emphasis on functional mobility/transfers, toileting, generalized strengthening and ROM, dynamic standing balance/coordination, stair navigation, and gait training. Sit<>stand with RW and CGA and ambulated 1073f 2 trials with RW and CGA in/out of bathroom. Pt required mod A for clothing management and able to void - performed peri-care standing with CGA. Sat in WC and washed hands then transported to therapy gym [facility] purposes. Demonstrated  technique for stair navigation using shower chair method and pt navigated 4 steps with L handrail and mod A to ascend (CGA to descend) using shower chair technique with cues for foot placement with total A from therapist to move chair - good adherence to LLE NWB precautions. Sit<>stand with RW and CGA and ambulated 21f57fth RW and CGA - limited by fatigue and increased RLE pain. Pt then performed RLE strengthening on Kinetron at 20cm/sec for 1 minute increasing to 10 cm/sec for 1 minute with therapist providing manual counter resistance with emphasis on glute/quad strengthening. Pt performed WC mobility 125ft47fng BUE and supervision back to room. Concluded session with pt sitting in WC, needs within reach, and seatbelt alarm on. CSW present at bedside and provided pt with ice pack for R knee.  ? ?Therapy Documentation ?Precautions:  ?Precautions ?Precautions: Fall ?Precaution Comments: LLE NWB ?Required Braces or Orthoses: Other Brace ?Other Brace: L hinged knee brace, unlocked ?Restrictions ?Weight Bearing Restrictions: Yes ?LLE Weight Bearing: Non weight bearing ?Other Position/Activity Restrictions: No LLE ROM restrictions per acute ? ?Therapy/Group: Individual Therapy ?Doron Shake Blenda Nicelya Becky SaxDPT  ? ?04/18/2021, 7:16 AM  ?
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
## 2021-04-18 NOTE — Care Management (Signed)
Inpatient Rehabilitation Center ?Individual Statement of Services ? ?Patient Name:  Tammy [name]  ?Date:  04/18/2021 ? ?Welcome to the [facility].  Our goal is to provide you with an individualized program based on your diagnosis and situation, designed to meet your specific needs.  With this comprehensive rehabilitation program, you will be expected to participate in at least 3 hours of rehabilitation therapies Monday-Friday, with modified therapy programming on the weekends. ? ?Your rehabilitation program will include the following services:  Physical Therapy (PT), Occupational Therapy (OT), 24 hour per day rehabilitation nursing, Therapeutic Recreaction (TR), Psychology, Neuropsychology, Care Coordinator, Rehabilitation Medicine, Nutrition Services, Pharmacy Services, and Other ? ?Weekly team conferences will be held on Wednesdays to discuss your progress.  Your Inpatient Rehabilitation Care Coordinator will talk with you frequently to get your input and to update you on team discussions.  Team conferences with you and your family in attendance may also be held. ? ?Expected length of stay: 5-7 days   ? ?Overall anticipated outcome: Independent with Assistive Device ? ?Depending on your progress and recovery, your program may change. Your Inpatient Rehabilitation Care Coordinator will coordinate services and will keep you informed of any changes. Your Inpatient Rehabilitation Care Coordinator's name and contact numbers are listed  below. ? ?The following services may also be recommended but are not provided by the [facility]:  ?Driving Evaluations ?Home Health Rehabiltiation Services ?Outpatient Rehabilitation Services ?Vocational Rehabilitation ?  ?Arrangements will be made to provide these services after discharge if needed.  Arrangements include referral to agencies that provide these services. ? ?Your insurance has been verified to be:  [facility] Medicare ? ?Your  primary doctor is:  Sami Sheryle Hail ? ?Pertinent information will be shared with your doctor and your insurance company. ? ?Inpatient Rehabilitation Care Coordinator:  Erlene Quan, [place] or ([phone] ? ?Information discussed with and copy given to patient by: Rana Snare, 04/18/2021, 3:57 PM    ?

## 2021-04-18 NOTE — Progress Notes (Signed)
Physical Therapy Session Note ? ?Patient Details  ?Name: Tammy [name] ?MRN: [id number] ?Date of Birth: 06-13-1955 ? ?Today's Date: 04/18/2021 ?PT Individual Time: 381-0175 ?PT minutes: 60 minutes  ? ?Short Term Goals: ?Week 1:  PT Short Term Goal 1 (Week 1): STG=LTG due to ELOS. ? ?Skilled Therapeutic Interventions/Progress Updates: Pt presents sitting EOB and agreeable to therapy after changing.  Pt transfers sit to stand w/ CGA and amb x 12' into BR.  Pt required CGA for toilet transfer.  Pt continent of B and B in toilet.  Pt required A for brief placement, then stood w/ CGA and pt able to perform pericare w/o LOB.  Pt required min A for pulling up brief 2/2 velcro and metal stays of hinged knee brace.  Pt then required min A for threading scrub pants over L foot and then able to perform R LE w/ supervision.  Pt required min A to complete pulling up pants.  Pt amb to w/c for seated rest break.  Pt then amb x 3' to sink for brushing teeth.  Pt wheeled self to dayroom w/ supervision.  Pt performed UB there ex w/ 4# weighted bar for shoulder press and chest press 3 x 10.  Pt returned to room and remained sitting in w/c w/ chair alarm on and all needs in reach. ?   ? ?Therapy Documentation ?Precautions:  ?Precautions ?Precautions: Fall ?Precaution Comments: LLE NWB ?Required Braces or Orthoses: Other Brace ?Other Brace: L hinged knee brace, unlocked ?Restrictions ?Weight Bearing Restrictions: Yes ?LLE Weight Bearing: Non weight bearing ?Other Position/Activity Restrictions: No LLE ROM restrictions per acute ?General: ?  ?Vital Signs: ?Therapy Vitals ?Temp: 98.4 ?F (36.9 ?C) ?Temp Source: Oral ?Pulse Rate: 78 ?Resp: 18 ?BP: 123/68 ?Patient Position (if appropriate): Lying ?Oxygen Therapy ?SpO2: 100 % ?O2 Device: Room Air ?Pain:5/10 L knee ?  ?Mobility: ?  ? ? ? ? ?Therapy/Group: Individual Therapy ? ?Ladoris Gene ?04/18/2021, 3:32 PM  ?

## 2021-04-18 NOTE — Progress Notes (Signed)
Occupational Therapy Session Note ? ?Patient Details  ?Name: Tammy [name] ?MRN: [id number] ?Date of Birth: [birth year] ? ?Today's Date: 04/18/2021 ?OT Individual Time: 8372-9021 ?OT Individual Time Calculation (min): 39 min  ? ? ?Short Term Goals: ?Week 1:  OT Short Term Goal 1 (Week 1): STG = LTG 2/2 ELOS ? ?Skilled Therapeutic Interventions/Progress Updates:  ?Skilled OT intervention completed with focus on AE education, LB clothing management, shower transfers. Pt received seated in w/c, agreeable to session. Pt reported difficulty with LB clothing, toileting and desiring a shower. Education provided to pt about using reacher for LB threading, with pt able to return demonstrate use at the supervision level with cues only for efficiency. Pt able to donn bilateral socks with supervision. Incontinence was brought up by pt, with OT educating on less invasive method of wearing underwear/pad for light leakage vs full brief to protect skin integrity and be more functional, however pt reports that her fiance is bringing pull ups tonight. Pt completed sit > stand with CGA using RW, then ambulatory transfer <> TTB in shower with CGA. Able to sit pivot on bench with supervision, then education provided on shower techniques with covering the leg prior to shower, and DME that could be useful in the home. Plan to shower with next OT session. Pt was left seated in w/c, with belt alarm on and all needs in reach at end of session. ?  ? ?Therapy Documentation ?Precautions:  ?Precautions ?Precautions: Fall ?Precaution Comments: LLE NWB ?Required Braces or Orthoses: Other Brace ?Other Brace: L hinged knee brace, unlocked ?Restrictions ?Weight Bearing Restrictions: Yes ?LLE Weight Bearing: Non weight bearing ?Other Position/Activity Restrictions: No LLE ROM restrictions per acute ? ?Pain: ?No c/o pain ? ? ?Therapy/Group: Individual Therapy ? ?Nilan Iddings E Bana Borgmeyer ?04/18/2021, 7:44 AM ?

## 2021-04-19 MED ORDER — ACETAMINOPHEN 500 MG PO TABS
1000.0000 mg | ORAL_TABLET | Freq: Three times a day (TID) | ORAL | Status: DC
  Administered 2021-04-19 – 2021-04-22 (×7): 1000 mg via ORAL
  Filled 2021-04-19 (×9): qty 2

## 2021-04-19 NOTE — IPOC Note (Signed)
Overall Plan of Care (IPOC) ?Patient Details ?Name: Tammy [name] ?MRN: [id number] ?DOB: Nov 21, 1955 ? ?Admitting Diagnosis: Hip fracture requiring operative repair [facility]) ? ?Hospital Problems: Principal Problem: ?  Hip fracture requiring operative repair [facility]) ? ? ? ? Functional Problem List: ?Nursing Bowel, Endurance, Motor, Pain, Safety, Skin Integrity  ?PT Balance, Safety, Edema, Endurance, Skin Integrity, Motor, Nutrition, Pain  ?OT Balance, Endurance, Motor, Pain, Perception, Safety, Skin Integrity  ?SLP    ?TR    ?    ? Basic ADL?s: ?OT Grooming, Bathing, Dressing, Toileting  ? ?  Advanced  ADL?s: ?OT    ?   ?Transfers: ?PT Bed Mobility, Bed to Chair, Car, Furniture  ?OT Toilet, Tub/Shower  ? ?  Locomotion: ?PT Ambulation, Wheelchair Mobility, Stairs  ? ?  Additional Impairments: ?OT None  ?SLP   ?  ?   ?TR    ? ? ?Anticipated Outcomes ?Item Anticipated Outcome  ?Self Feeding no goal  ?Swallowing ?   ?  ?Basic self-care ? Mod I  ?Toileting ? Mod I ?  ?Bathroom Transfers Mod I  ?Bowel/Bladder ? Mod I  ?Transfers ? mod I using LRAD  ?Locomotion ? Supervision 50 ft using LRAD  ?Communication ?    ?Cognition ?    ?Pain ? < 3  ?Safety/Judgment ? Mod I  ? ?Therapy Plan: ?PT Intensity: Minimum of 1-2 x/day ,45 to 90 minutes ?PT Frequency: 5 out of 7 days ?PT Duration Estimated Length of Stay: 5-7 days ?OT Intensity: Minimum of 1-2 x/day, 45 to 90 minutes ?OT Frequency: 5 out of 7 days ?OT Duration/Estimated Length of Stay: 5-7 days ?   ? ?Due to the current state of emergency, patients may not be receiving their 3-hours of Medicare-mandated therapy. ? ? Team Interventions: ?Nursing Interventions Patient/Family Education, Bowel Management, Pain Management, Discharge Planning, Skin Care/Wound Management  ?PT interventions Ambulation/gait training, Discharge planning, DME/adaptive equipment instruction, Functional mobility training, Pain management, Psychosocial support, Splinting/orthotics, Therapeutic  Activities, UE/LE Strength taining/ROM, Wheelchair propulsion/positioning, UE/LE Coordination activities, Therapeutic Exercise, Stair training, Skin care/wound management, Patient/family education, Neuromuscular re-education, Functional electrical stimulation, Disease management/prevention, Academic librarian, Training and development officer  ?OT Interventions Balance/vestibular training, Discharge planning, Functional electrical stimulation, Therapeutic Activities, Pain management, Self Care/advanced ADL retraining, UE/LE Coordination activities, Visual/perceptual remediation/compensation, Therapeutic Exercise, Skin care/wound managment, Patient/family education, Functional mobility training, Disease mangement/prevention, Cognitive remediation/compensation, Community reintegration, DME/adaptive equipment instruction, Neuromuscular re-education, Psychosocial support, Splinting/orthotics, UE/LE Strength taining/ROM, Wheelchair propulsion/positioning  ?SLP Interventions    ?TR Interventions    ?SW/CM Interventions Discharge Planning, Psychosocial Support, Patient/Family Education  ? ?Barriers to Discharge ?MD    ?Nursing Decreased caregiver support, Home environment access/layout, Wound Care, Weight bearing restrictions ?Mobile home, 1 level, 5 steps, 2 rails. [place] anticipated caregiver. Family can rotate 24/7.  ?PT Home environment access/layout, Weight bearing restrictions ?   ?OT   ?   ?SLP   ?   ?SW Decreased caregiver support, Lack of/limited family support ?   ? ?Team Discharge Planning: ?Destination: PT-Home ,OT- Home , SLP-  ?Projected Follow-up: PT-Home health PT, OT-  None, SLP-  ?Projected Equipment Needs: PT-Rolling walker with 5" wheels, Wheelchair cushion (measurements), OT- To be determined, SLP-  ?Equipment Details: PT-18"x16" light weight wheelchair with standard cushion, OT-  ?Patient/family involved in discharge planning: PT- Patient,  OT-Patient, SLP-  ? ?MD ELOS: 10-12 days ?Medical Rehab  Prognosis:  Excellent ?Assessment: The patient has been admitted for CIR therapies with the diagnosis of left femur fracture. The team will be addressing  functional mobility, strength, stamina, balance, safety, adaptive techniques and equipment, self-care, bowel and bladder mgt, patient and caregiver education. Goals have been set at supervision. Anticipated discharge destination is home. ? ? ? ?See Team Conference Notes for weekly updates to the plan of care  ?

## 2021-04-19 NOTE — Progress Notes (Signed)
Occupational Therapy Session Note ? ?Patient Details  ?Name: Tammy [name] ?MRN: [id number] ?Date of Birth: [birth year] ? ?Today's Date: 04/19/2021 ?OT Individual Time: 1001-1101 ?OT Individual Time Calculation (min): 60 min  ? ? ?Short Term Goals: ?Week 1:  OT Short Term Goal 1 (Week 1): STG = LTG 2/2 ELOS ? ?Skilled Therapeutic Interventions/Progress Updates:  ?Skilled OT intervention completed with focus on ADL retraining, functional transfers. Pt received seated in w/c, requesting to use restroom. Pt completed sit > stand with supervision using RW, then stand hop transfer from w/c <> commode with CGA using RW. Good adherence to NWB precautions throughout session. Pt was continent of bladder only. Pt's fiance had brought pull ups for pt to wear with pt utilizing reacher to donn while on commode with supervision, then CGA needed for balance while donning over hips in stance. Seated at sink pt completed UB bathing/dressing with set up A, then LB bathing with min A for reaching L foot. Pt expressed that her TED hose was soiled, with therapist checking and receiving clearance from MD (Raulkar) prior to removing leg brace. Therapist provided total A for removal of brace, washing of LLE, and redonning new TEDS, with nursing notified of pt's surgical bandage coming off and nurse in room to remove. Education provided to pt about donning brace for maximizing her independence. Pt utilized reach for donning shorts with supervision, at the sit > stand level. Pt was left seated in w/c, with chair alarm on and all needs in reach at end of session.  ? ? ?Therapy Documentation ?Precautions:  ?Precautions ?Precautions: Fall ?Precaution Comments: LLE NWB ?Required Braces or Orthoses: Other Brace ?Other Brace: L hinged knee brace, unlocked ?Restrictions ?Weight Bearing Restrictions: Yes ?LLE Weight Bearing: Non weight bearing ?Other Position/Activity Restrictions: No LLE ROM restrictions per acute ? ?Pain: ?No c/o  pain ? ? ?Therapy/Group: Individual Therapy ? ?[place] ?04/19/2021, 7:42 AM ?

## 2021-04-19 NOTE — Progress Notes (Signed)
?                                                       PROGRESS NOTE ? ? ?Subjective/Complaints: ?No new complaints this morning ?She asks if brace can be removed to shower.  ?Had a large BM ? ? ?ROS: +LLE pain, constipation resolved ? ? ?Objective: ?  ?No results found. ?Recent Labs  ?  04/17/21 ?5784  ?WBC 5.2  ?HGB 7.9*  ?HCT 25.0*  ?PLT 285  ? ?Recent Labs  ?  04/17/21 ?6962  ?NA 140  ?K 3.6  ?CL 105  ?CO2 26  ?GLUCOSE 110*  ?BUN 13  ?CREATININE 0.66  ?CALCIUM 9.3  ? ? ?Intake/Output Summary (Last 24 hours) at 04/19/2021 1457 ?Last data filed at 04/19/2021 0900 ?Gross per 24 hour  ?Intake 480 ml  ?Output --  ?Net 480 ml  ?  ? ?  ? ?Physical Exam: ?Vital Signs ?Blood pressure 120/64, pulse 67, temperature 98.2 ?F (36.8 ?C), resp. rate 18, height '5\' 2"'$  (1.575 m), weight 71.6 kg, SpO2 99 %. ?Gen: no distress, normal appearing, BMI 28.87 ?HEENT: oral mucosa pink and moist, NCAT ?Cardiovascular:  ?   Rate and Rhythm: Normal rate and regular rhythm.  ?   Comments: Systolic murmur ?Pulmonary:  ?   Effort: Pulmonary effort is normal.  ?   Breath sounds: Normal breath sounds.  ?Abdominal:  ?   General: Bowel sounds are normal. There is no distension.  ?   Palpations: Abdomen is soft.  ?   Tenderness: There is no abdominal tenderness.  ?Musculoskeletal:  ?   Comments: Left surgical incisional dressing intact; TED hose in place; LLE hinged brace in place  ?Skin: ?   General: Skin is warm and dry.  ?Neurological:  ?   Mental Status: She is alert. Mental status is at baseline.  ?GU: Continent of bladder ? ? ?Assessment/Plan: ?1. Functional deficits which require 3+ hours per day of interdisciplinary therapy in a comprehensive inpatient rehab setting. ?Physiatrist is providing close team supervision and 24 hour management of active medical problems listed below. ?Physiatrist and rehab team continue to assess barriers to discharge/monitor patient progress toward functional and medical goals ? ?Care Tool: ? ?Bathing ?   ?Body  parts bathed by patient: Right arm, Left arm, Chest, Abdomen, Front perineal area, Buttocks, Right upper leg, Left upper leg, Face, Right lower leg  ? Body parts bathed by helper: Left lower leg ?  ?  ?Bathing assist Assist Level: Minimal Assistance - Patient > 75% ?  ?  ?Upper Body Dressing/Undressing ?Upper body dressing Upper body dressing/undressing activity did not occur (including orthotics): N/A ?What is the patient wearing?: Pull over shirt ?   ?Upper body assist Assist Level: Set up assist ?   ?Lower Body Dressing/Undressing ?Lower body dressing ? ? ? Lower body dressing activity did not occur: N/A ?What is the patient wearing?: Incontinence brief, Pants ? ?  ? ?Lower body assist Assist for lower body dressing: Contact Guard/Touching assist ?   ? ?Toileting ?Toileting Toileting Activity did not occur (Probation officer and hygiene only): N/A (no void or bm)  ?Toileting assist Assist for toileting: Minimal Assistance - Patient > 75% ?  ?  ?Transfers ?Chair/bed transfer ? ?Transfers assist ?   ? ?Chair/bed transfer assist level: Contact Guard/Touching assist ?  ?  ?  Locomotion ?Ambulation ? ? ?Ambulation assist ? ?   ? ?Assist level: Contact Guard/Touching assist ?Assistive device: Walker-rolling ?Max distance: 35f  ? ?Walk 10 feet activity ? ? ?Assist ?   ? ?Assist level: Contact Guard/Touching assist ?Assistive device: Walker-rolling  ? ?Walk 50 feet activity ? ? ?Assist Walk 50 feet with 2 turns activity did not occur: Safety/medical concerns (decreased endurance) ? ?  ?   ? ? ?Walk 150 feet activity ? ? ?Assist Walk 150 feet activity did not occur: Safety/medical concerns ? ?  ?  ?  ? ?Walk 10 feet on uneven surface  ?activity ? ? ?Assist Walk 10 feet on uneven surfaces activity did not occur: Safety/medical concerns ? ? ?  ?   ? ?Wheelchair ? ? ? ? ?Assist Is the patient using a wheelchair?: Yes ?Type of Wheelchair: Manual ?  ? ?Wheelchair assist level: Supervision/Verbal cueing ?Max wheelchair  distance: 1520f ? ? ?Wheelchair 50 feet with 2 turns activity ? ? ? ?Assist ? ?  ?  ? ? ?Assist Level: Supervision/Verbal cueing  ? ?Wheelchair 150 feet activity  ? ? ? ?Assist ?   ? ? ?Assist Level: Supervision/Verbal cueing  ? ?Blood pressure 120/64, pulse 67, temperature 98.2 ?F (36.8 ?C), resp. rate 18, height '5\' 2"'$  (1.575 m), weight 71.6 kg, SpO2 99 %. ? ? ?Medical Problem List and Plan: ?1. Functional deficits secondary to left femur fracture ?            -patient may shower but incisions must be covered ?            -ELOS/Goals: 10-12 days S ?           HFU scheduled ? Continue CIR ?2.  Antithrombotics: ?-DVT/anticoagulation:  Pharmaceutical: Other (comment) apixaban 2.5 mg twice daily.  Anticipated course of 6 weeks per ortho ?            -antiplatelet therapy: None ?3. Postoperative pain: Tylenol, oxycodone as needed. Asked nursing to bring her an oxycodone now.  ?-- continue Neurontin 100 mg twice daily ?--Ice and elevate for swelling/pain control ?Increase scheduled tylenol to '650mg'$  TID ?4. Mood: LCSW to evaluate and provide emotional support ?            -antipsychotic agents: None ?5. Neuropsych: This patient is capable of making decisions on her own behalf. ?6. Skin/Wound Care: Routine skin care checks ?            -- Monitor surgical incision ?            -- Nylon sutures in place; remove approximately 3/16 ?            -- Dressing changes as needed ?7. Fluids/Electrolytes/Nutrition: Routine I's and O's and follow-up chemistries ?8: Hypokalemia: improved with supplementation, advised regarding high potassium foods ?9: Vitamin D deficiency: Level 10.88 on 04/11/21.Continue ergocalciferol 50,000U once per week for 7 weeks. Discontinue BID 2,000U supplement.  ?            -- Ortho recommending DEXA scan in next 4 to 8 weeks ?            -- Referral to osteoporosis clinic ?10: Acute blood loss anemia: 11.4>>8.2. Iron panel reviewed and stable.  ?11: Hypertension: Continue amlodipine, hydralazine. Increase  magnesium gluconate to 500 mg HS ?12: Recent treatment for breast cancer: Continue Arimidex daily ?13: Left femur fracture status post ORIF: ?            -- Hinged  brace in place  ?            -- Nonweightbearing left lower extremity x 6 to 8 weeks ?            -- Unrestricted range of motion left knee in hinged brace (this can be off when in bed and when showering- discussed with patient) ?            --May work on range of motion without brace with therapist supervision ? -discussed best to keep compression garment in place to reduce swelling ?14. Overweight: BMI 28.87: provide dietary education ? ?LOS: ?3 days ?A FACE TO FACE EVALUATION WAS PERFORMED ? ?Tammy [name] ?04/19/2021, 2:57 PM  ? ?  ?

## 2021-04-19 NOTE — Progress Notes (Signed)
Physical Therapy Session Note ? ?Patient Details  ?Name: Tammy [name] ?MRN: [id number] ?Date of Birth: [birth year] ? ?Today's Date: 04/19/2021 ?PT Individual Time:  Session 1: 5956-3875 ?   Session 2: 13:45-14:43 ?PT Individual Time Calculation (min):  Session 1: 58 min  ?      Session 2: 58 min      ? ?Short Term Goals: ?Week 1:  PT Short Term Goal 1 (Week 1): STG=LTG due to ELOS. ? ?Skilled Therapeutic Interventions/Progress Updates:  ?Treatment Session 1  ?Received pt sitting in WC, pt agreeable to PT treatment, and reported 2/10 pain in L knee. Session with emphasis on functional mobility/transfers, safety awareness, injury prevention in setting of precautions, ROM, and dynamic standing balance/coordination. Pt transferred to [facility] with SSTT using supervision + RW. Pt then performed WC mobility 138f using BUE and supervision to main therapy gym . In gym, pt completed 2x10 shoulder press with 4# dumbbell. Pt then instructed to ambulate as far as possible and determine point at which she needed to return to chair as exercise for self-awareness of energy level. Pt completed 2 rounds and was able to amb 16 ft and then 226fwith CGA + RW. Pt then completed 2 rounds of UT/LS stretch for 2x30 sec and 2 rounds of L knee ROM. Pt able to achieve 50deg knee flex for 2 rounds. Pt completed 20036fC propulsion back to room with supervision. In room, pt was educated and demonstrated completion of [facility] balance at sink for self care requiring supervision + RW. Pt concluded session with pt sitting in WC, needs within reach, and seatbelt alarm on.  ?  ?Treatment Session 2 ?Received pt sitting in WC, pt agreeable to PT treatment, and reported pain 2/10 in RLE during session.  Session with emphasis on functional mobility/transfers, self-care, generalized strengthening, and ROM. Pt started session with amb of 47f70fth CGA+ RW. Pt then demonstrated technique for returning to bed in  home setup. Pt talked through placement of WC, WC management, RW management, and safety precautions. Pt transferred with supervision + RW. Once in bed, pt demonstrated home exercises and was assisted with ROM exercises. Pt completed 4x8 knee flex AROM reaching 70deg. Pt verbalized and demonstrated correct technique for SLR and hip abd for 4x10 with min VC to correct technique. Pt then sat EOB and completed B scap flex with yellow T-band for 2x10 and VC for technique. Pt was able to recall UT/LS stretch, but required explanation to correct technique. Pt then repeated amb of 47ft95fh sup + RW. Concluded session with pt supine in bed, needs within reach, and seatbelt alarm on.   ? ?Therapy Documentation ?Precautions:  ?Precautions ?Precautions: Fall ?Precaution Comments: LLE NWB ?Required Braces or Orthoses: Other Brace ?Other Brace: L hinged knee brace, unlocked ?Restrictions ?Weight Bearing Restrictions: Yes ?LLE Weight Bearing: Non weight bearing ?Other Position/Activity Restrictions: No LLE ROM restrictions per acute ?General: ?  ? ? ?Therapy/Group: Individual Therapy ? ?Kalani Sthilaire Marquette Saa DPT ?04/19/2021, 3:51 PM  ?

## 2021-04-20 NOTE — Progress Notes (Signed)
Physical Therapy Session Note ? ?Patient Details  ?Name: Tammy [name] ?MRN: [id number] ?Date of Birth: [birth year] ? ?Today's Date: 04/20/2021 ?PT Individual Time: 4098-1191 ?PT Individual Time Calculation (min): 41 min  ? ?Short Term Goals: ?Week 1:  PT Short Term Goal 1 (Week 1): STG=LTG due to ELOS. ? ?Skilled Therapeutic Interventions/Progress Updates:  ?  Session focused on d/c planning and functional mobility retraining. Pt able to remove straps for Bledsoe brace and PT donned TEdhose and educated on ways to adjust the brace to modify due to varying levels of swelling. Pt performed bed mobility independent to come to EOB to don shorts and performed sit > stand with Supervision to pull up pants. SHort distance gait in room with close supervision/CGA to w/c. Pt self propelled w/c mod I on unit for functional UE strengthening and endurance x 120', limited by LUE fatigue and soreness. Practiced mobility in ADL apartment to simulate home environment mobility and discussed set up. Practiced manuevering in w/c as well as with RW and navigating around small spaces and around furniture. Pt also practiced bed mobility and transfers on the regular bed to simulate home environment with transfer at supervision level. Pt demonstrated and verbalized some of her HEP prior to heading back to room. Set up with all needs in place.  ? ?Therapy Documentation ?Precautions:  ?Precautions ?Precautions: Fall ?Precaution Comments: LLE NWB ?Required Braces or Orthoses: Other Brace ?Other Brace: L hinged knee brace, unlocked ?Restrictions ?Weight Bearing Restrictions: Yes ?LLE Weight Bearing: Weight bearing as tolerated ?Other Position/Activity Restrictions: No LLE ROM restrictions per acute ? ?Pain: ?Premedicated for pain in LLE. Pt did not rate.  ? ? ? ?Therapy/Group: Individual Therapy ? ?[name] ?Lars Masson, PT, DPT, CBIS ? ?04/20/2021, 8:13 AM  ?

## 2021-04-21 ENCOUNTER — Telehealth: Payer: Self-pay | Admitting: *Deleted

## 2021-04-21 LAB — CBC
HCT: 24.3 % — ABNORMAL LOW (ref 36.0–46.0)
Hemoglobin: 7.8 g/dL — ABNORMAL LOW (ref 12.0–15.0)
MCH: 28.4 pg (ref 26.0–34.0)
MCHC: 32.1 g/dL (ref 30.0–36.0)
MCV: 88.4 fL (ref 80.0–100.0)
Platelets: 283 10*3/uL (ref 150–400)
RBC: 2.75 MIL/uL — ABNORMAL LOW (ref 3.87–5.11)
RDW: 14.9 % (ref 11.5–15.5)
WBC: 4.9 10*3/uL (ref 4.0–10.5)
nRBC: 0 % (ref 0.0–0.2)

## 2021-04-21 LAB — COMPREHENSIVE METABOLIC PANEL
ALT: 13 U/L (ref 0–44)
AST: 13 U/L — ABNORMAL LOW (ref 15–41)
Albumin: 2.9 g/dL — ABNORMAL LOW (ref 3.5–5.0)
Alkaline Phosphatase: 78 U/L (ref 38–126)
Anion gap: 8 (ref 5–15)
BUN: 9 mg/dL (ref 8–23)
CO2: 25 mmol/L (ref 22–32)
Calcium: 9.2 mg/dL (ref 8.9–10.3)
Chloride: 107 mmol/L (ref 98–111)
Creatinine, Ser: 0.57 mg/dL (ref 0.44–1.00)
GFR, Estimated: >60 mL/min (ref >60)
Glucose, Bld: 102 mg/dL — ABNORMAL HIGH (ref 70–99)
Potassium: 3.4 mmol/L — ABNORMAL LOW (ref 3.5–5.1)
Sodium: 140 mmol/L (ref 135–145)
Total Bilirubin: 0.4 mg/dL (ref 0.3–1.2)
Total Protein: 6.4 g/dL — ABNORMAL LOW (ref 6.5–8.1)

## 2021-04-21 MED ORDER — POTASSIUM CHLORIDE CRYS ER 20 MEQ PO TBCR
40.0000 meq | EXTENDED_RELEASE_TABLET | Freq: Once | ORAL | Status: AC
  Administered 2021-04-21: 40 meq via ORAL
  Filled 2021-04-21: qty 2

## 2021-04-21 NOTE — Progress Notes (Signed)
?                                                       PROGRESS NOTE ? ? ?Subjective/Complaints: ? ?Pt reports slept OK- Kpad is "amazing" and really helps pain.  ?LBM 2 days ago, but feels like needs to go already.  ? ?Pain doing better.  ?No issues otherwise.  ? ? ?ROS:  ?Pt denies SOB, abd pain, CP, N/V/C/D, and vision changes ? ?Objective: ?  ?No results found. ?Recent Labs  ?  04/21/21 ?9476  ?WBC 4.9  ?HGB 7.8*  ?HCT 24.3*  ?PLT 283  ? ?Recent Labs  ?  04/21/21 ?5465  ?NA 140  ?K 3.4*  ?CL 107  ?CO2 25  ?GLUCOSE 102*  ?BUN 9  ?CREATININE 0.57  ?CALCIUM 9.2  ? ? ?Intake/Output Summary (Last 24 hours) at 04/21/2021 0956 ?Last data filed at 04/20/2021 1801 ?Gross per 24 hour  ?Intake 440 ml  ?Output --  ?Net 440 ml  ?  ? ?  ? ?Physical Exam: ?Vital Signs ?Blood pressure 134/72, pulse 69, temperature 98 ?F (36.7 ?C), temperature source Oral, resp. rate 17, height '5\' 2"'$  (1.575 m), weight 71.6 kg, SpO2 100 %. ? ? ? ?General: awake, alert, appropriate, sitting up in bed; NAD ?HENT: conjugate gaze; oropharynx moist ?CV: regular rate; no JVD ?Pulmonary: CTA B/L; no W/R/R- good air movement ?GI: soft, NT, ND, (+)BS- normoactive ?Psychiatric: appropriate ?Neurological: Ox3 ?[name]  ?Musculoskeletal:  ?   Comments: Left surgical incisional dressing intact; TED hose in place; LLE hinged brace in place - no change ?Skin: ?   General: Skin is warm and dry.  ?Neurological:  ?   Mental Status: She is alert. Mental status is at baseline.  ?GU: Continent of bladder ? ? ?Assessment/Plan: ?1. Functional deficits which require 3+ hours per day of interdisciplinary therapy in a comprehensive inpatient rehab setting. ?Physiatrist is providing close team supervision and 24 hour management of active medical problems listed below. ?Physiatrist and rehab team continue to assess barriers to discharge/monitor patient progress toward functional and medical goals ? ?Care Tool: ? ?Bathing ?   ?Body parts bathed by patient: Right arm, Left arm,  Chest, Abdomen, Front perineal area, Buttocks, Right upper leg, Left upper leg, Face, Right lower leg  ? Body parts bathed by helper: Left lower leg ?  ?  ?Bathing assist Assist Level: Minimal Assistance - Patient > 75% ?  ?  ?Upper Body Dressing/Undressing ?Upper body dressing Upper body dressing/undressing activity did not occur (including orthotics): N/A ?What is the patient wearing?: Pull over shirt ?   ?Upper body assist Assist Level: Set up assist ?   ?Lower Body Dressing/Undressing ?Lower body dressing ? ? ? Lower body dressing activity did not occur: N/A ?What is the patient wearing?: Incontinence brief, Pants ? ?  ? ?Lower body assist Assist for lower body dressing: Contact Guard/Touching assist ?   ? ?Toileting ?Toileting Toileting Activity did not occur (Probation officer and hygiene only): N/A (no void or bm)  ?Toileting assist Assist for toileting: Minimal Assistance - Patient > 75% ?  ?  ?Transfers ?Chair/bed transfer ? ?Transfers assist ?   ? ?Chair/bed transfer assist level: Supervision/Verbal cueing ?  ?  ?Locomotion ?Ambulation ? ? ?Ambulation assist ? ?   ? ?Assist level: Contact Guard/Touching assist ?  Assistive device: Walker-rolling ?Max distance: 72'  ? ?Walk 10 feet activity ? ? ?Assist ?   ? ?Assist level: Contact Guard/Touching assist ?Assistive device: Walker-rolling  ? ?Walk 50 feet activity ? ? ?Assist Walk 50 feet with 2 turns activity did not occur: Safety/medical concerns (decreased endurance) ? ?  ?   ? ? ?Walk 150 feet activity ? ? ?Assist Walk 150 feet activity did not occur: Safety/medical concerns ? ?  ?  ?  ? ?Walk 10 feet on uneven surface  ?activity ? ? ?Assist Walk 10 feet on uneven surfaces activity did not occur: Safety/medical concerns ? ? ?  ?   ? ?Wheelchair ? ? ? ? ?Assist Is the patient using a wheelchair?: Yes ?Type of Wheelchair: Manual ?  ? ?Wheelchair assist level: Independent ?Max wheelchair distance: 120'  ? ? ?Wheelchair 50 feet with 2 turns  activity ? ? ? ?Assist ? ?  ?  ? ? ?Assist Level: Independent  ? ?Wheelchair 150 feet activity  ? ? ? ?Assist ?   ? ? ?Assist Level: Supervision/Verbal cueing  ? ?Blood pressure 134/72, pulse 69, temperature 98 ?F (36.7 ?C), temperature source Oral, resp. rate 17, height '5\' 2"'$  (1.575 m), weight 71.6 kg, SpO2 100 %. ? ? ?Medical Problem List and Plan: ?1. Functional deficits secondary to left femur fracture ?            -patient may shower but incisions must be covered ?            -ELOS/Goals: 10-12 days S ?           HFU scheduled ? Con't CIR- PT and OT ?2.  Antithrombotics: ?-DVT/anticoagulation:  Pharmaceutical: Other (comment) apixaban 2.5 mg twice daily.  Anticipated course of 6 weeks per ortho ?            -antiplatelet therapy: None ?3. Postoperative pain: Tylenol, oxycodone as needed. Asked nursing to bring her an oxycodone now.  ?-- continue Neurontin 100 mg twice daily ?--Ice and elevate for swelling/pain control ?Increase scheduled tylenol to '650mg'$  TID ?3/13- pain doing better esp with kpad- con't regimen ?4. Mood: LCSW to evaluate and provide emotional support ?            -antipsychotic agents: None ?5. Neuropsych: This patient is capable of making decisions on her own behalf. ?6. Skin/Wound Care: Routine skin care checks ?            -- Monitor surgical incision ?            -- Nylon sutures in place; remove approximately 3/16 ?            -- Dressing changes as needed ?7. Fluids/Electrolytes/Nutrition: Routine I's and O's and follow-up chemistries ?8: Hypokalemia: improved with supplementation, advised regarding high potassium foods ?9: Vitamin D deficiency: Level 10.88 on 04/11/21.Continue ergocalciferol 50,000U once per week for 7 weeks. Discontinue BID 2,000U supplement.  ?            -- Ortho recommending DEXA scan in next 4 to 8 weeks ?            -- Referral to osteoporosis clinic ?10: Acute blood loss anemia: 11.4>>8.2. Iron panel reviewed and stable.  ? 3/13- Hb down slightly more to 7.8- will  recheck Thursday just to make sure stable ?11: Hypertension: Continue amlodipine, hydralazine. Increase magnesium gluconate to 500 mg HS ? 3/13- BP controlled- con't regimen ?12: Recent treatment for breast cancer: Continue Arimidex daily ?13: Left  femur fracture status post ORIF: ?            -- Hinged brace in place  ?            -- Nonweightbearing left lower extremity x 6 to 8 weeks ?            -- Unrestricted range of motion left knee in hinged brace (this can be off when in bed and when showering- discussed with patient) ?            --May work on range of motion without brace with therapist supervision ? -discussed best to keep compression garment in place to reduce swelling ?14. Overweight: BMI 28.87: provide dietary education ?15. Hypokalemia ? 3/13- K+ 3.4- will replete 40 mEq x1` ? ?I spent a total of  35  minutes on total care today- >50% coordination of care- due to education on kpad; and review of chart as well as BP/K+.  ? ?LOS: ?5 days ?A FACE TO FACE EVALUATION WAS PERFORMED ? ?Paw Karstens ?04/21/2021, 9:56 AM  ? ?  ?

## 2021-04-21 NOTE — Progress Notes (Signed)
Physical Therapy Session Note ? ?Patient Details  ?Name: Tammy [name] ?MRN: [id number] ?Date of Birth: 06/03/1955 ? ?Today's Date: 04/21/2021 ?PT Individual Time: 5051-8335 ?PT Individual Time Calculation (min): 56 min  ? ?Short Term Goals: ?Week 1:  PT Short Term Goal 1 (Week 1): STG=LTG due to ELOS. ? ?Skilled Therapeutic Interventions/Progress Updates:  ? Received pt sitting in [facility] with fiance and daughter present for family education training. Pt agreeable to PT treatment and reported pain 3/10 in LLE increasing to 5/10 with mobility - pain medication and ice pack provided at end of session. Session with emphasis on discharge planning, functional mobility/transfers, generalized strengthening and endurance, dynamic standing balance/coordination, stair navigation, and simulated car transfers. Pt's equipment delivered; therefore adjusted height of RW - pt reported shower chair will be delivered to home tomorrow and [facility] will arrive to hospital tomorrow per CSW. Pt transported to main therapy gym in [facility] dependently for time management purposes. Of note, pt demonstrating good adherence to LLE NWB precautions throughout session. Pt then navigated 4 steps with L handrail and mod A (ascending) and CGA (descending) with therapist using shower chair technique - total A from therapist to move shower chair and min cues for R foot placement on step. Pt took seated rest break then navigated additional 2 steps with L handrail with fiance providing mod A to ascend and CGA to descend, and moving shower chair - limited by fatigue/pain. Pt performed WC mobility 183f using BUE and supervision/mod I to ortho gym and performed simulated car transfer with RW and supervision with cues for set up of WC and reminder to use RW for safety with transfer. Educated pt's family on WC parts management including donning/doffing legrests and family verbalized and demonstrated confidence with task. Pt politely declined ambulating due to  pain/fatigue and performed WC mobility >1527fusing BUE and supervision/mod I back to room. Provided pt with HEP and educated on frequency/duration/technique for the following exercises: ?-Single Leg Heel Raise with Unilateral Counter Support - 1 x daily - 7 x weekly - 2 sets - 10 reps ?-Supine Ankle Pumps - 1 x daily - 7 x weekly - 3 sets - 10 reps ?-Supine Hip Abduction - 1 x daily - 7 x weekly - 3 sets - 10 reps ?-Supine Heel Slide - 1 x daily - 7 x weekly - 3 sets - 5 reps ?-Supine Isometric Hip Adduction with Pillow at Knees - 1 x daily - 7 x weekly - 3 sets - 10 reps ?-Supine Active Straight Leg Raise - 1 x daily - 7 x weekly - 2 sets - 10 reps ?[facility] and NT present checking vitals.   ? ?Therapy Documentation ?Precautions:  ?Precautions ?Precautions: Fall ?Precaution Comments: LLE NWB ?Required Braces or Orthoses: Other Brace ?Other Brace: L hinged knee brace, unlocked ?Restrictions ?Weight Bearing Restrictions: Yes ?LLE Weight Bearing: Weight bearing as tolerated ?Other Position/Activity Restrictions: No LLE ROM restrictions per acute ? ?Therapy/Group: Individual Therapy ?AnBlenda NicelyAnBecky SaxT, DPT  ?04/21/2021, 7:33 AM  ?

## 2021-04-21 NOTE — Progress Notes (Signed)
Occupational Therapy Session Note ? ?Patient Details  ?Name: Tammy [name] ?MRN: [id number] ?Date of Birth: [birth year] ? ?Today's Date: 04/21/2021 ?OT Individual Time: 1101-1200 ?OT Individual Time Calculation (min): 59 min  ? ? ?Short Term Goals: ?Week 1:  OT Short Term Goal 1 (Week 1): STG = LTG 2/2 ELOS ? ?Skilled Therapeutic Interventions/Progress Updates:  ?Patient met seated in wc in agreement with OT treatment session. 4/10 pain reported at rest and with activity. Patient hopped 19f with RW and close chair follow. Good adherence to LLE NWB precautions. Wc mobility the rest of the way to the dayroom. Patient participated in [place] in standing with focus on several sit to stand transfers, static standing tolerance and adherence to LLE NWB precautions. Wc mobility to hospital gift shop and functional mobility ~144fwith RW before reporting increased fatigue. Able to self-propel wc and navigate wc into/out of elevator to return to hospital room. Session concluded with patient seated in wc with call bell within reach, belt alarm activated and all needs met.  ? ?Therapy Documentation ?Precautions:  ?Precautions ?Precautions: Fall ?Precaution Comments: LLE NWB ?Required Braces or Orthoses: Other Brace ?Other Brace: L hinged knee brace, unlocked ?Restrictions ?Weight Bearing Restrictions: Yes ?LLE Weight Bearing: Weight bearing as tolerated ?Other Position/Activity Restrictions: No LLE ROM restrictions per acute ?General: ?  ?Therapy/Group: Individual Therapy ? ?Parisha Beaulac R Howerton-Davis ?04/21/2021, 9:32 AM ?

## 2021-04-21 NOTE — Progress Notes (Signed)
Occupational Therapy Session Note ? ?Patient Details  ?Name: Tammy [name] ?MRN: [id number] ?Date of Birth: 1955-05-23 ? ?Today's Date: 04/21/2021 ?OT Individual Time: 6286-3817 ?OT Individual Time Calculation (min): 55 min  ? ? ?Short Term Goals: ?Week 1:  OT Short Term Goal 1 (Week 1): STG = LTG 2/2 ELOS ? ?Skilled Therapeutic Interventions/Progress Updates:  ?  Initially pt missed ~7 min with enuropsych finishing session ?Pt received in bed with premedicated pain  ?ADL: ?Pt completes ADL at overall Set/up to MOD I Level. Skilled interventions include: edu re bagging LLE since hinged knee brace needed to stay on and positioning of TTB/stool  for good positioning in Tub shower. Toileting, bathing, and dressing including transfers and transitional movements completed at MOD I level. Pt with good safety awareness and SL balance during peri care/dressing. Pt stands to groom at sink with MOD I stabilizing hips on sink. Pt edu re walker tray for IADLs and energy conservation. Handout provided.  ? ?Therapeutic exercise ?3x10 e ?W/x push ups ?Overhead press  ?Chest press  ?BUE strengthning required for functional mob/ADLs ? ?Pt left at end of session in w/c with exit alarm on, call light in reach and all needs met ? ? ?Therapy Documentation ?Precautions:  ?Precautions ?Precautions: Fall ?Precaution Comments: LLE NWB ?Required Braces or Orthoses: Other Brace ?Other Brace: L hinged knee brace, unlocked ?Restrictions ?Weight Bearing Restrictions: Yes ?LLE Weight Bearing: Weight bearing as tolerated ?Other Position/Activity Restrictions: No LLE ROM restrictions per acute ? ?Therapy/Group: Individual Therapy ? ?Lowella Dell Syenna Nazir ?04/21/2021, 7:28 AM ?

## 2021-04-22 MED ORDER — ZINC SULFATE 220 (50 ZN) MG PO CAPS: 220.0000 mg | ORAL_CAPSULE | Freq: Every day | ORAL | 0 refills | Status: DC

## 2021-04-22 MED ORDER — HYDRALAZINE HCL 25 MG PO TABS: 25.0000 mg | ORAL_TABLET | Freq: Two times a day (BID) | ORAL | 0 refills | Status: DC

## 2021-04-22 MED ORDER — APIXABAN 2.5 MG PO TABS: 2.5000 mg | ORAL_TABLET | Freq: Two times a day (BID) | ORAL | 0 refills | Status: DC

## 2021-04-22 MED ORDER — AMLODIPINE BESYLATE 10 MG PO TABS: 10.0000 mg | ORAL_TABLET | Freq: Every day | ORAL | 0 refills | Status: DC

## 2021-04-22 MED ORDER — MAGNESIUM GLUCONATE 500 MG PO TABS: 500.0000 mg | ORAL_TABLET | Freq: Every day | ORAL | 0 refills | Status: DC

## 2021-04-22 MED ORDER — ASCORBIC ACID 500 MG PO TABS: 500.0000 mg | ORAL_TABLET | Freq: Every day | ORAL | 0 refills | Status: DC

## 2021-04-22 MED ORDER — GABAPENTIN 100 MG PO CAPS: 100.0000 mg | ORAL_CAPSULE | Freq: Two times a day (BID) | ORAL | 0 refills | Status: DC

## 2021-04-22 MED ORDER — VITAMIN D (ERGOCALCIFEROL) 1.25 MG (50000 UNIT) PO CAPS
50000.0000 [IU] | ORAL_CAPSULE | ORAL | 0 refills | Status: DC
Start: 2021-04-26 — End: 2021-06-02

## 2021-04-22 MED ORDER — MULTIVITAMIN PO TABS: 1.0000 | ORAL_TABLET | Freq: Every day | ORAL | Status: DC

## 2021-04-22 MED ORDER — PANTOPRAZOLE SODIUM 40 MG PO TBEC: 40.0000 mg | DELAYED_RELEASE_TABLET | Freq: Every day | ORAL | 0 refills | Status: DC

## 2021-04-22 MED ORDER — ACETAMINOPHEN 325 MG PO TABS: 325.0000 mg | ORAL_TABLET | ORAL | Status: DC | PRN

## 2021-04-22 NOTE — Progress Notes (Signed)
Inpatient Rehabilitation Care Coordinator ?Discharge Note  ? ?Patient Details  ?Name: Tammy [name] ?MRN: [id number] ?Date of Birth: [birth year] ? ? ?Discharge location: Home ? ?Length of Stay: 6 Days ? ?Discharge activity level: MOD I W/ AD ? ?Home/community participation: daughter ? ?Patient response QK:MMNOTR Literacy - How often do you need to have someone help you when you read instructions, pamphlets, or other written material from your doctor or pharmacy?: Never ? ?Patient response RN:HAFBXU Isolation - How often do you feel lonely or isolated from those around you?: Never ? ?Services provided included: SW, Pharmacy, TR, CM, RN, SLP, OT, PT, RD, MD ? ?Financial Services:  ?Charity fundraiser Utilized: Private Insurance ?[employer] ? ?Choices offered to/list presented to: n/a (OP once WB restrictions are released) ? ?Follow-up services arranged:  ?Outpatient ?   ?Outpatient Servicies: (OP once WB restrictions are released) ?  ?  ? ?Patient response to transportation need: ?Is the patient able to respond to transportation needs?: Yes ?In the past 12 months, has lack of transportation kept you from medical appointments or from getting medications?: No ?In the past 12 months, has lack of transportation kept you from meetings, work, or from getting things needed for daily living?: No ? ? ? ?Comments (or additional information): ? ?Patient/Family verbalized understanding of follow-up arrangements:  Yes ? ?Individual responsible for coordination of the follow-up plan: patient ? ?Confirmed correct DME delivered: Dyanne Iha 04/22/2021   ? ?Dyanne Iha ?

## 2021-04-22 NOTE — Progress Notes (Signed)
Physical Therapy Discharge Summary ? ?Patient Details  ?Name: Tammy [name] ?MRN: [id number] ?Date of Birth: 15-Feb-1955 ? ?Today's Date: 04/22/2021 ?PT Individual Time: 1030-1100 ?PT Individual Time Calculation (min): 30 min   ?Today's Date: 04/22/2021 ?PT Missed Time: 30 Minutes ?Missed Time Reason: Other (Comment) (prepared for discharge) ? ? ?Patient has met 8 of 9 long term goals due to improved activity tolerance, improved balance, improved postural control, increased strength, increased range of motion, decreased pain, ability to compensate for deficits, and improved coordination.  Patient to discharge at a wheelchair level Modified Independent.  Patient's care partner is independent to provide the necessary physical assistance at discharge. Pt's fiance and daughter were present on 3/13 for family education training and verbalized and demonstrated confidence with all tasks to ensure safe discharge home.  ? ?Reasons goals not met: Pt did not meet stair navigation goal of 5 steps with 1 rail and min A. Pt currently requires mod A when ascending and CGA when descending steps using shower chair technique due to increased pain with RLE in dependent positions, weakness, and fatigue.  ? ?Recommendation:  ?Patient will benefit from ongoing skilled PT services in outpatient setting once WB restrictions have been lifted, to continue to advance safe functional mobility, address ongoing impairments in transfers, generalized strengthening and ROM, dynamic standing balance/coordination, gait training, and to minimize fall risk. ? ?Equipment: ?Youth RW, 18x16 manual WC with L elevating legrest. Pt purchased shower chair for stair navigation ? ?Reasons for discharge: treatment goals met ? ?Patient/family agrees with progress made and goals achieved: Yes ? ?Today's Interventions: ?Received pt sitting in WC with PA present at bedside. Pt reports MD has cleared her to D/C after morning therapies. Pt agreeable to PT  treatment and reported pain 2/10 in LLE (premedicated). Session with emphasis on discharge planning, functional mobility/transfers, generalized strengthening and endurance, dynamic standing balance/coordination, and gait training. Pt's new WC delivered and transferred into new WC mod I and therapist adjusted lengths of legrests. Pt then transported to ortho gym dependently for energy conservation purposes and ambulated 9f on uneven surfaces (ramp) with RW and supervision - cues for eccentric control when descending ramp. Pt then ambulated 578fwith RW and supervision/mod I (first 2567fod I fading to supervision for remaining 61f74fe to fatigue, resulting in poor R foot clearance). Pt verbalized confidence with stair navigation and declined practicing again, requesting to save the rest of her energy for discharge home. Performed WC mobility >150ft70fng BUE and mod I back to room. [facility] awaiting PA's arrival with discharge paperwork. 30 minutes missed of skilled physical therapy due to pt discharging.  ? ?PT Discharge ?Precautions/Restrictions ?Precautions ?Precautions: Fall ?Precaution Comments: LLE NWB ?Required Braces or Orthoses: Other Brace ?Other Brace: L hinged knee brace, unlocked ?Restrictions ?Weight Bearing Restrictions: Yes ?LLE Weight Bearing: Non weight bearing ?Other Position/Activity Restrictions: No LLE ROM restrictions per acute ?Pain Interference ?Pain Interference ?Pain Effect on Sleep: 3. Frequently ?Pain Interference with Therapy Activities: 0. Does not apply - I have not received rehabilitationtherapy in the past 5 days ?Pain Interference with Day-to-Day Activities: 1. Rarely or not at all ?Cognition ?Overall Cognitive Status: Within Functional Limits for tasks assessed ?Arousal/Alertness: Awake/alert ?Orientation Level: Oriented X4 ?Memory: Appears intact ?Awareness: Appears intact ?Problem Solving: Appears  intact ?Safety/Judgment: Appears intact ?Sensation ?Sensation ?Light Touch: Appears Intact ?Hot/Cold: Appears Intact ?Proprioception: Appears Intact ?Stereognosis: Not tested ?Additional Comments: numbness along 3rd toe on L  foot ?Coordination ?Gross Motor Movements are Fluid and Coordinated: Yes ?Fine Motor Movements are Fluid and Coordinated: Yes ?Coordination and Movement Description: altered balance strategies due to LLE NWB status ?Finger Nose Finger Test: [facility] bilaterally ?Motor  ?Motor ?Motor: Within Functional Limits ?Motor - Skilled Clinical Observations: altered balance strategies due to LLE NWB status  ?Mobility ?Bed Mobility ?Bed Mobility: Rolling Right;Rolling Left;Sit to Supine;Supine to Sit ?Rolling Right: Independent with assistive device ?Rolling Left: Independent with assistive device ?Supine to Sit: Independent with assistive device ?Sit to Supine: Independent with assistive device ?Transfers ?Transfers: Sit to Stand;Stand to [employer] Transfers ?Sit to Stand: Independent with assistive device ?Stand to Sit: Independent with assistive device ?Stand Pivot Transfers: Independent with assistive device ?Transfer (Assistive device): Rolling walker (youth) ?Locomotion  ?Gait ?Ambulation: Yes ?Gait Assistance: Independent with assistive device ?Gait Distance (Feet): 25 Feet ?Assistive device: Rolling walker ?Gait ?Gait: Yes ?Gait Pattern: Impaired ?Gait Pattern: Step-to pattern;Decreased step length - right;Decreased stride length;Antalgic;Poor foot clearance - right ?Gait velocity: decresaed ?Stairs / Additional Locomotion ?Stairs: Yes ?Stairs Assistance: Moderate Assistance - Patient 50 - 74% ?Stair Management Technique: One rail Left;Other (comment) (shower chair) ?Number of Stairs: 4 ?Height of Stairs: 6 ?Ramp: Supervision/Verbal cueing (RW) ?Pick up small object from the floor assist level: Dependent - Patient 0% ?Wheelchair Mobility ?Wheelchair Mobility: Yes ?Wheelchair Assistance: Independent  with assistive device ?Wheelchair Propulsion: Both upper extremities ?Wheelchair Parts Management: Needs assistance ?Distance: >125f  ?Trunk/Postural Assessment  ?Cervical Assessment ?Cervical Assessment: Within Functional Limits ?Thoracic Assessment ?Thoracic Assessment: Within Functional Limits ?Lumbar Assessment ?Lumbar Assessment: Within Functional Limits ?Postural Control ?Postural Control: Within Functional Limits  ?Balance ?Balance ?Balance Assessed: Yes ?Dynamic Sitting Balance ?Dynamic Sitting - Balance Support: During functional activity;Feet supported;No upper extremity supported ?Dynamic Sitting - Level of Assistance: 6: Modified independent (Device/Increase time) ?Static Standing Balance ?Static Standing - Balance Support: Bilateral upper extremity supported (youth RW) ?Static Standing - Level of Assistance: 6: Modified independent (Device/Increase time) ?Dynamic Standing Balance ?Dynamic Standing - Balance Support: Bilateral upper extremity supported (youth RW) ?Dynamic Standing - Level of Assistance: 6: Modified independent (Device/Increase time) ?Extremity Assessment  ?RLE Assessment ?RLE Assessment: [facility]?General Strength Comments: Grossly 5/5 throughout in sitting ?LLE Assessment ?LLE Assessment: [facility] to [facility]?General Strength Comments: Grossly 3+/5 throughout in available range ? ?ABlenda Nicely?ABecky SaxPT, DPT  ?04/22/2021, 7:38 AM ?

## 2021-04-22 NOTE — Patient Care Conference (Signed)
Inpatient RehabilitationTeam Conference and Plan of Care Update ?Date: 04/22/2021   Time: 12:52 PM  ? ? ?Patient Name: Tammy [name]      ?Medical Record Number: [id number]  ?Date of Birth: 18-Jul-1955 ?Sex: Female         ?Room/Bed: 5H29J/2E26S-34 ?Payor Info: Payor: Marine scientist / Plan: [facility] MEDICARE / Product Type: *No Product type* /   ? ?Admit Date/Time:  04/16/2021  3:34 PM ? ?Primary Diagnosis:  Hip fracture requiring operative repair [facility]) ? ?Hospital Problems: Principal Problem: ?  Hip fracture requiring operative repair [facility]) ? ? ? ?Expected Discharge Date: Expected Discharge Date: 04/22/21 ? ?Team Members Present: ?Physician leading conference: Dr. Leeroy Cha ?Social Worker Present: Erlene Quan, BSW ?Nurse Present: Dorthula Nettles, RN ?PT Present: Becky Sax, PT ?OT Present: Other (comment) [facility] Augusto Garbe, [place]) ?PPS Coordinator present : Gunnar Fusi, SLP ? ?   Current Status/Progress Goal Weekly Team Focus  ?Bowel/Bladder ? ? continent b/b  remain continent  toilet as needed   ?Swallow/Nutrition/ Hydration ? ?           ?ADL's ? ? mod I for self-care at the shower & toileting/ambulatory level  At goal level- mod I  d/c planning   ?Mobility ? ? bed mobility mod I, transfers with RW mod I, gait 77f with RW supervision/mod I, 4 steps 1 rail min/mod A, mod I WC mobility  Mod I  family ed, D/C planning, functional mobility/transfers, generalized strengthening and endurance, HEP   ?Communication ? ?           ?Safety/Cognition/ Behavioral Observations ?           ?Pain ? ? no pain reported  remain pain free  assess pain q 4hr and prn   ?Skin ? ? skin tear right wrist, right hip incision  no new breakdown/infection  assess skin q shift and prn   ? ? ?Discharge Planning:  ?Discharging home on 3/14   ?Team Discussion: ?Hip fracture, discontinue sutures. Continent B/B, pain management with Tylenol and Oxy IR. K-pad provided for pain. Incision CDI. Discharging home. ? ?Patient on  target to meet rehab goals: ?yes, mod I goals. Discharging at mod I. ? ?*See Care Plan and progress notes for long and short-term goals.  ? ?Revisions to Treatment Plan:  ?Finalizing discharge. ?  ?Teaching Needs: ?Family education completed. ?  ?Current Barriers to Discharge: ?No barriers noted. ? ?Possible Resolutions to Barriers: ?All barriers addressed. ?  ? ? Medical Summary ?Current Status: hip fracture requiring opertive repair, overweight, hypokalemia, hypoalbuminemia, hyperglycemia, hypertension ? Barriers to Discharge: Medical stability;Weight bearing restrictions;Weight ? Barriers to Discharge Comments: hip fracture requiring opertive repair, overweight, hypokalemia, hypoalbuminemia, hyperglycemia, hypertension ?Possible Resolutions to BR[employer]on monitor incision, discussed follow-up with orthopedics, provided dietary education, continue norvasc ? ? ?Continued Need for Acute Rehabilitation Level of Care: The patient requires daily medical management by a physician with specialized training in physical medicine and rehabilitation for the following reasons: ?Direction of a multidisciplinary physical rehabilitation program to maximize functional independence : Yes ?Medical management of patient stability for increased activity during participation in an intensive rehabilitation regime.: Yes ?Analysis of laboratory values and/or radiology reports with any subsequent need for medication adjustment and/or medical intervention. : Yes ? ? ?I attest that I was present, lead the team conference, and concur with the assessment and plan of the team. ? ? ?JDorthula NettlesG ?04/22/2021, 4:49 PM  ? ? ? ? ? ? ?

## 2021-04-22 NOTE — Progress Notes (Signed)
Occupational Therapy Session Note ? ?Patient Details  ?Name: Tammy [name] ?MRN: [id number] ?Date of Birth: October 28, 1955 ? ?Today's Date: 04/22/2021 ?OT Individual Time: 7829-5621 ?OT Individual Time Calculation (min): 55 min ? ? ?Short Term Goals: ?Week 1:  OT Short Term Goal 1 (Week 1): STG = LTG 2/2 ELOS ? ?Skilled Therapeutic Interventions/Progress Updates:  ?Session 1 ?Skilled OT intervention completed with focus on d/c planning, discussion of rehab goals and POC as well as self-care, functional transfers. Pt received seated EOB, agreeable to session. Pt was at a mod I level for all ambulatory transfers with RW and self-care this session, with pt donning LB clothing at EOB, then seated at sink sponge bathed UB and completed grooming tasks. Education provided to pt about safety considerations while home, as well as practicing her independence during today's sessions with her gathering her own items as if she were truly mod I at home alone. BUE HEP provided, with pt with good return demonstration, including the following to promote strength needed for functional tasks: ? ?(10 reps each) ?Horizontal abduction ?Bicep flexion each side ?Shoulder flexion each side ?Chest presses ?Shoulder external rotation ?Shoulder extension ?Shoulder diagonal pulls ? ?Verbal cues needed for form and technique. Pt was left seated in w/c, with chair alarm on and all needs in reach at end of session with MD present.  ? ?Therapy Documentation ?Precautions:  ?Precautions ?Precautions: Fall ?Precaution Comments: LLE NWB ?Required Braces or Orthoses: Other Brace ?Other Brace: L hinged knee brace, unlocked ?Restrictions ?Weight Bearing Restrictions: Yes ?LLE Weight Bearing: Weight bearing as tolerated ?Other Position/Activity Restrictions: No LLE ROM restrictions per acute ? ?Pain: ?No c/o pain ? ? ?Therapy/Group: Individual Therapy ? ?[place] ?04/22/2021, 7:26 AM ?

## 2021-04-22 NOTE — Progress Notes (Signed)
Inpatient Rehabilitation Discharge Medication Review by a Pharmacist ? ?A complete drug regimen review was completed for this patient to identify any potential clinically significant medication issues. ? ?High Risk Drug Classes Is patient taking? Indication by Medication  ?Antipsychotic No   ?Anticoagulant Yes Apixaban- s/p ORIF LT femur fracture  ?Antibiotic No   ?Opioid No   ?Antiplatelet No   ?Hypoglycemics/insulin No   ?Vasoactive Medication Yes Hydralazine- hypertension  ?Chemotherapy No   ?Other Yes Arimidex- Breast CA (LT) ?Wellbutrin- MDD ?Gabapentin- neuropathic pain ?Protonix- GERD  ? ? ? ?Type of Medication Issue Identified Description of Issue Recommendation(s)  ?Drug Interaction(s) (clinically significant) ?    ?Duplicate Therapy ?    ?Allergy ?    ?No Medication Administration End Date ?    ?Incorrect Dose ?    ?Additional Drug Therapy Needed ?    ?Significant med changes from prior encounter (inform family/care partners about these prior to discharge).    ?Other ?    ? ? ?Clinically significant medication issues were identified that warrant physician communication and completion of prescribed/recommended actions by midnight of the next day:  No ? ?Time spent performing this drug regimen review (minutes):  30 ? ? ?Gao Mitnick BS, PharmD, BCPS ?Clinical Pharmacist ?04/22/2021 11:36 AM ? ?

## 2021-04-22 NOTE — Progress Notes (Signed)
?                                                       PROGRESS NOTE ? ? ?Subjective/Complaints: ?Stable for d/c today ?Wound evaluated and healing very well! Discussed outpatient follow-up with dr. Marcelino Scot.  ? ? ?ROS:  ?Pt denies SOB, abd pain, CP, N/V/C/D, and vision changes ? ?Objective: ?  ?No results found. ?Recent Labs  ?  04/21/21 ?1610  ?WBC 4.9  ?HGB 7.8*  ?HCT 24.3*  ?PLT 283  ? ?Recent Labs  ?  04/21/21 ?9604  ?NA 140  ?K 3.4*  ?CL 107  ?CO2 25  ?GLUCOSE 102*  ?BUN 9  ?CREATININE 0.57  ?CALCIUM 9.2  ? ? ?Intake/Output Summary (Last 24 hours) at 04/22/2021 1016 ?Last data filed at 04/22/2021 0737 ?Gross per 24 hour  ?Intake 720 ml  ?Output --  ?Net 720 ml  ?  ? ?  ? ?Physical Exam: ?Vital Signs ?Blood pressure (!) 151/85, pulse 79, temperature 97.8 ?F (36.6 ?C), temperature source Oral, resp. rate 18, height '5\' 2"'$  (1.575 m), weight 71.6 kg, SpO2 100 %. ?Gen: no distress, normal appearing ?HEENT: oral mucosa pink and moist, NCAT ?Cardio: Reg rate ?Chest: normal effort, normal rate of breathing ?Abd: soft, non-distended ?Ext: no edema ?Psych: pleasant, normal affect ?[name]  ?Musculoskeletal:  ?   Comments: Left surgical incisional dressing intact; TED hose in place; LLE hinged brace in place - no change ?Skin: ?   General: Skin is warm and dry. Incision healing well ?Neurological:  ?   Mental Status: She is alert. Mental status is at baseline.  ?GU: Continent of bladder ? ? ?Assessment/Plan: ?1. Functional deficits which require 3+ hours per day of interdisciplinary therapy in a comprehensive inpatient rehab setting. ?Physiatrist is providing close team supervision and 24 hour management of active medical problems listed below. ?Physiatrist and rehab team continue to assess barriers to discharge/monitor patient progress toward functional and medical goals ? ?Care Tool: ? ?Bathing ?   ?Body parts bathed by patient: Right arm, Left arm, Chest, Abdomen, Front perineal area, Buttocks, Right upper leg, Left upper leg,  Face, Right lower leg  ? Body parts bathed by helper: Left lower leg ?Body parts n/a: Left lower leg (covered with bag during shower for brace/stitches) ?  ?Bathing assist Assist Level: Independent with assistive device ?  ?  ?Upper Body Dressing/Undressing ?Upper body dressing   ?What is the patient wearing?: Pull over shirt ?   ?Upper body assist Assist Level: Independent ?   ?Lower Body Dressing/Undressing ?Lower body dressing ? ? ?   ?What is the patient wearing?: Underwear/pull up, Pants ? ?  ? ?Lower body assist Assist for lower body dressing: Independent with assitive device ?Assistive Device Comment: reacher  ? ?Toileting ?Toileting    ?Toileting assist Assist for toileting: Independent with assistive device ?  ?  ?Transfers ?Chair/bed transfer ? ?Transfers assist ?   ? ?Chair/bed transfer assist level: Supervision/Verbal cueing ?  ?  ?Locomotion ?Ambulation ? ? ?Ambulation assist ? ?   ? ?Assist level: Contact Guard/Touching assist ?Assistive device: Walker-rolling ?Max distance: 94'  ? ?Walk 10 feet activity ? ? ?Assist ?   ? ?Assist level: Contact Guard/Touching assist ?Assistive device: Walker-rolling  ? ?Walk 50 feet activity ? ? ?Assist Walk 50 feet with 2  turns activity did not occur: Safety/medical concerns (decreased endurance) ? ?  ?   ? ? ?Walk 150 feet activity ? ? ?Assist Walk 150 feet activity did not occur: Safety/medical concerns ? ?  ?  ?  ? ?Walk 10 feet on uneven surface  ?activity ? ? ?Assist Walk 10 feet on uneven surfaces activity did not occur: Safety/medical concerns ? ? ?  ?   ? ?Wheelchair ? ? ? ? ?Assist Is the patient using a wheelchair?: Yes ?Type of Wheelchair: Manual ?  ? ?Wheelchair assist level: Independent ?Max wheelchair distance: 120'  ? ? ?Wheelchair 50 feet with 2 turns activity ? ? ? ?Assist ? ?  ?  ? ? ?Assist Level: Independent  ? ?Wheelchair 150 feet activity  ? ? ? ?Assist ?   ? ? ?Assist Level: Supervision/Verbal cueing  ? ?Blood pressure (!) 151/85, pulse 79,  temperature 97.8 ?F (36.6 ?C), temperature source Oral, resp. rate 18, height '5\' 2"'$  (1.575 m), weight 71.6 kg, SpO2 100 %. ? ? ?Medical Problem List and Plan: ?1. Functional deficits secondary to left femur fracture ?            -patient may shower but incisions must be covered ?            -ELOS/Goals: 6 days S ?           HFU scheduled ? D/c home today ?2.  Antithrombotics: ?-DVT/anticoagulation:  Pharmaceutical: Other (comment) apixaban 2.5 mg twice daily.  Anticipated course of 6 weeks per ortho ?            -antiplatelet therapy: None ?3. Postoperative pain: Tylenol, oxycodone as needed. Asked nursing to bring her an oxycodone now.  ?-- continue Neurontin 100 mg twice daily ?--Ice and elevate for swelling/pain control ?Increase scheduled tylenol to '650mg'$  TID ?3/13- pain doing better esp with kpad- con't regimen ?4. Mood: LCSW to evaluate and provide emotional support ?            -antipsychotic agents: None ?5. Neuropsych: This patient is capable of making decisions on her own behalf. ?6. Surgical incision ?            -- Monitor surgical incision, healing well. Nylon sutures in place; remove outpatient at ortho follow-up.  ?            -- Dressing changes as needed ?7. Fluids/Electrolytes/Nutrition: Routine I's and O's and follow-up chemistries ?8: Hypokalemia: improved with supplementation, advised regarding high potassium foods ?9: Vitamin D deficiency: Level 10.88 on 04/11/21.Continue ergocalciferol 50,000U once per week for 7 weeks. Discontinue BID 2,000U supplement.  ?            -- Ortho recommending DEXA scan in next 4 to 8 weeks ?            -- Referral to osteoporosis clinic ?10: Acute blood loss anemia: 11.4>>8.2. Iron panel reviewed and stable.  ? 3/13- Hb down slightly more to 7.8- will recheck Thursday just to make sure stable ?11: Hypertension: Continue amlodipine, hydralazine. Increase magnesium gluconate to 500 mg HS ? 3/13- BP controlled- con't regimen ?12: Recent treatment for breast cancer:  Continue Arimidex daily ?13: Left femur fracture status post ORIF: ?            -- Hinged brace in place  ?            -- Nonweightbearing left lower extremity x 6 to 8 weeks ?            --  Unrestricted range of motion left knee in hinged brace (this can be off when in bed and when showering- discussed with patient) ?            --May work on range of motion without brace with therapist supervision ? -discussed best to keep compression garment in place to reduce swelling ?14. Overweight: BMI 28.87: provide dietary education ?15. Hypokalemia ? 3/13- K+ 3.4- will replete 40 mEq x1` ? ? >30 minutes spent in discharge of patient including review of medications and follow-up appointments, physical examination, and in answering all patient's questions  ? ?LOS: ?6 days ?A FACE TO FACE EVALUATION WAS PERFORMED ? ?Tammy [name] ?04/22/2021, 10:16 AM  ? ?  ?

## 2021-04-22 NOTE — Progress Notes (Signed)
Occupational Therapy Discharge Summary ? ?Patient Details  ?Name: Tammy [name] ?MRN: [id number] ?Date of Birth: January 19, 1956 ? ? ?Patient has met 9 of 9 long term goals due to improved activity tolerance, improved balance, improved awareness, and improved coordination.  Patient to discharge at overall Modified Independent level.  Patient's fiance is available to provide intermittent assist for higher level tasks, and has completed family education with OT. ? ?Reasons goals not met: n/a ? ?Recommendation:  ?No f/u OT as pt is at mod I level ? ?Equipment: ?3 in 1 BSC, pt already has TTB for tub/shower per report ? ?Reasons for discharge: treatment goals met ? ?Patient/family agrees with progress made and goals achieved: Yes ? ?OT Discharge ?Precautions/Restrictions  ?Precautions ?Precautions: Fall ?Precaution Comments: LLE NWB ?Required Braces or Orthoses: Other Brace ?Other Brace: L hinged knee brace, unlocked ?Restrictions ?Weight Bearing Restrictions: Yes ?LLE Weight Bearing: Non weight bearing ?Other Position/Activity Restrictions: No LLE ROM restrictions per acute ?ADL ?ADL ?Eating: Independent ?Where Assessed-Eating: Wheelchair ?Grooming: Independent ?Where Assessed-Grooming: Standing at sink ?Upper Body Bathing: Independent ?Where Assessed-Upper Body Bathing: Shower ?Lower Body Bathing: Modified independent ?Where Assessed-Lower Body Bathing: Shower ?Upper Body Dressing: Independent ?Where Assessed-Upper Body Dressing: Sitting at sink ?Lower Body Dressing: Modified independent ?Where Assessed-Lower Body Dressing: Sitting at sink, Standing at sink ?Toileting: Modified independent ?Where Assessed-Toileting: Bedside Commode ?Toilet Transfer: Modified independent ?Toilet Transfer Method: Stand pivot, Ambulating ?Science writer: Bedside commode, Grab bars ?Tub/Shower Transfer: Modified independent ?Tub/Shower Transfer Method: Sit pivot ?Tub/Shower Equipment: Radio broadcast assistant ?Walk-In Shower  Transfer: Modified independent ?Walk-In Shower Transfer Method: Ambulating ?Walk-In Shower Equipment: Radio broadcast assistant ?Vision ?Baseline Vision/History: 0 No visual deficits ?Patient Visual Report: No change from baseline ?Vision Assessment?: No apparent visual deficits ?Perception  ?Perception: Within Functional Limits ?Praxis ?Praxis: Intact ?Cognition ?Overall Cognitive Status: Within Functional Limits for tasks assessed ?Arousal/Alertness: Awake/alert ?Orientation Level: Oriented X4 ?Year: 2023 ?Month: March ?Day of Week: Correct ?Memory: Appears intact ?Immediate Memory Recall: Sock;Blue;Bed ?Memory Recall Sock: Without Cue ?Memory Recall Blue: Without Cue ?Memory Recall Bed: Without Cue ?Awareness: Appears intact ?Problem Solving: Appears intact ?Safety/Judgment: Appears intact ?Sensation ?Sensation ?Light Touch: Appears Intact ?Hot/Cold: Appears Intact ?Proprioception: Appears Intact ?Stereognosis: Not tested ?Coordination ?Gross Motor Movements are Fluid and Coordinated: Yes ?Fine Motor Movements are Fluid and Coordinated: Yes ?Motor  ?Motor ?Motor: Within Functional Limits ?Motor - Skilled Clinical Observations: altered balance strategies due to LLE NWB status ?Mobility  ?Bed Mobility ?Bed Mobility: Rolling Right;Rolling Left;Sit to Supine;Supine to Sit ?Rolling Right: Independent with assistive device ?Rolling Left: Independent with assistive device ?Supine to Sit: Independent with assistive device ?Sit to Supine: Independent with assistive device ?Transfers ?Sit to Stand: Independent with assistive device ?Stand to Sit: Independent with assistive device  ?Trunk/Postural Assessment  ?Cervical Assessment ?Cervical Assessment: Within Functional Limits ?Thoracic Assessment ?Thoracic Assessment: Within Functional Limits ?Lumbar Assessment ?Lumbar Assessment: Within Functional Limits ?Postural Control ?Postural Control: Within Functional Limits  ?Balance ?Balance ?Balance Assessed: Yes ?Dynamic Sitting  Balance ?Dynamic Sitting - Balance Support: During functional activity;Feet supported;No upper extremity supported ?Dynamic Sitting - Level of Assistance: 6: Modified independent (Device/Increase time) ?Static Standing Balance ?Static Standing - Balance Support: Bilateral upper extremity supported (youth RW) ?Static Standing - Level of Assistance: 6: Modified independent (Device/Increase time) ?Dynamic Standing Balance ?Dynamic Standing - Balance Support: Bilateral upper extremity supported (youth RW) ?Dynamic Standing - Level of Assistance: 6: Modified independent (Device/Increase time) ?Extremity/Trunk Assessment ?RUE Assessment ?RUE Assessment: Within Functional Limits ?LUE Assessment ?LUE Assessment: Within Functional  Limits ? ? ?[place] ?04/22/2021, 7:44 AM ?

## 2021-04-22 NOTE — Progress Notes (Signed)
Alert and oriented x4, compliant with medication administration. No signs or symptoms of distress noted.  Medications reviewed and discharge instructions provided by Risa Grill, PA. Staff assisted patient off the unit into private car with no issues. Patient discharged with no complications. ? ? ? ?Yehuda Mao, LPN  ?

## 2021-04-23 ENCOUNTER — Telehealth: Payer: Self-pay | Admitting: Adult Health

## 2021-04-23 NOTE — Telephone Encounter (Signed)
.  Called patient to schedule appointment per 3/13 inbasket, patient is aware of date and time.   ?

## 2021-05-19 ENCOUNTER — Other Ambulatory Visit: Payer: Self-pay | Admitting: Physician Assistant

## 2021-05-20 ENCOUNTER — Encounter (HOSPITAL_COMMUNITY): Payer: Self-pay

## 2021-05-22 ENCOUNTER — Telehealth: Payer: Self-pay | Admitting: *Deleted

## 2021-05-29 ENCOUNTER — Encounter: Payer: Self-pay | Admitting: Adult Health

## 2021-05-29 ENCOUNTER — Inpatient Hospital Stay: Payer: Medicare Other | Attending: Adult Health | Admitting: Adult Health

## 2021-05-29 ENCOUNTER — Other Ambulatory Visit: Payer: Self-pay

## 2021-05-29 VITALS — BP 129/73 | HR 66 | Temp 97.9°F | Resp 16 | Wt 162.6 lb

## 2021-05-29 DIAGNOSIS — Z8041 Family history of malignant neoplasm of ovary: Secondary | ICD-10-CM | POA: Diagnosis not present

## 2021-05-29 DIAGNOSIS — R5383 Other fatigue: Secondary | ICD-10-CM | POA: Insufficient documentation

## 2021-05-29 DIAGNOSIS — Z170 Estrogen receptor positive status [ER+]: Secondary | ICD-10-CM | POA: Insufficient documentation

## 2021-05-29 DIAGNOSIS — R232 Flushing: Secondary | ICD-10-CM | POA: Insufficient documentation

## 2021-05-29 DIAGNOSIS — Z803 Family history of malignant neoplasm of breast: Secondary | ICD-10-CM | POA: Insufficient documentation

## 2021-05-29 DIAGNOSIS — I10 Essential (primary) hypertension: Secondary | ICD-10-CM | POA: Insufficient documentation

## 2021-05-29 DIAGNOSIS — N951 Menopausal and female climacteric states: Secondary | ICD-10-CM | POA: Diagnosis not present

## 2021-05-29 DIAGNOSIS — Z800 Family history of malignant neoplasm of digestive organs: Secondary | ICD-10-CM | POA: Diagnosis not present

## 2021-05-29 DIAGNOSIS — F17210 Nicotine dependence, cigarettes, uncomplicated: Secondary | ICD-10-CM | POA: Insufficient documentation

## 2021-05-29 DIAGNOSIS — C50412 Malignant neoplasm of upper-outer quadrant of left female breast: Secondary | ICD-10-CM | POA: Diagnosis present

## 2021-05-29 DIAGNOSIS — E2839 Other primary ovarian failure: Secondary | ICD-10-CM | POA: Diagnosis not present

## 2021-05-29 NOTE — Progress Notes (Signed)
SURVIVORSHIP VISIT: ? ?  ? ?BRIEF ONCOLOGIC HISTORY:  ?Oncology History  ?Malignant neoplasm of upper-outer quadrant of left breast in female, estrogen receptor positive ([place])  ?07/02/2020 Initial Diagnosis  ? status post left breast upper outer quadrant biopsy 07/02/2020 for a clinical T1b N0, stage IA invasive ductal carcinoma, grade 1, estrogen and progesterone receptor positive, HER2 not amplified, with an MIB-1 of 5% ?  ?07/10/2020 Cancer Staging  ? Staging form: Breast, AJCC 8th Edition ?- Clinical stage from 07/10/2020: Stage IA (cT1b, cN0, cM0, G1, ER+, PR+, HER2-) - Signed by [name], MD on 07/10/2020 ?Stage prefix: Initial diagnosis ?Histologic grading system: 3 grade system ? ?  ?07/17/2020 Genetic Testing  ? Negative hereditary cancer genetic testing: no pathogenic variants detected in [facility] BRCAPlus Panel or [facility] CancerNext-Expanded +RNAinsight.  The report dates are July 17, 2020 and July 22, 2020, respectively.  ? ?The BRCAplus panel offered by [employer] and includes sequencing and deletion/duplication analysis for the following 8 genes: ATM, BRCA1, BRCA2, CDH1, CHEK2, PALB2, PTEN, and TP53.  The CancerNext-Expanded gene panel offered by [facility] and includes sequencing, rearrangement, and RNA analysis for the following 77 genes: AIP, ALK, APC, ATM, AXIN2, BAP1, BARD1, BLM, BMPR1A, BRCA1, BRCA2, BRIP1, CDC73, CDH1, CDK4, CDKN1B, CDKN2A, CHEK2, CTNNA1, DICER1, FANCC, FH, FLCN, GALNT12, KIF1B, LZTR1, MAX, MEN1, MET, MLH1, MSH2, MSH3, MSH6, MUTYH, NBN, NF1, NF2, NTHL1, PALB2, PHOX2B, PMS2, POT1, PRKAR1A, PTCH1, PTEN, RAD51C, RAD51D, RB1, RECQL, RET, SDHA, SDHAF2, SDHB, SDHC, SDHD, SMAD4, SMARCA4, SMARCB1, SMARCE1, STK11, SUFU, TMEM127, TP53, TSC1, TSC2, VHL and XRCC2 (sequencing and deletion/duplication); EGFR, EGLN1, HOXB13, KIT, MITF, PDGFRA, POLD1, and POLE (sequencing only); EPCAM and GREM1 (deletion/duplication only).  ?  ?07/26/2020 Surgery  ? left lumpectomy and sentinel lymph node  sampling 07/26/2020 showed a pT1b pN0, stage IA invasive ductal carcinoma, grade 1, with negative margins. ?            (A) a total of 3 left axilla lymph nodes were removed ?  ?07/26/2020 Oncotype testing  ? Oncotype score of 16 predicts a risk of recurrence outside the breast within the next 9 years of 4% if the patient's only systemic therapy is an antiestrogen for 5 years.  It also predicts no benefit from chemotherapy ?  ?08/28/2020 - 09/25/2020 Radiation Therapy  ? adjuvant radiation 08/28/2020 through 09/25/2020 ?Site Technique Total Dose (Gy) Dose per Fx (Gy) Completed Fx Beam Energies  ?Breast, Left: Breast_Lt 3D 42.56/42.56 2.66 16/16 6XFFF, 10XFFF  ?Breast, Left: Breast_Lt_Bst 3D 8/8 2 4/4 6X, 10X  ?  ?10/21/2020 -  Anti-estrogen oral therapy  ? Anastrozole  ?  ?12/04/2020 Cancer Staging  ? Staging form: Breast, AJCC 8th Edition ?- Pathologic: Stage IA (pT1b, pN0, cM0, G1, ER+, PR+, HER2-) - Signed by Gardenia Phlegm, NP on 12/04/2020 ?Histologic grading system: 3 grade system ? ?  ? ? ?INTERVAL HISTORY:  ?Tammy [name] to review her survivorship care plan detailing her treatment course for breast cancer, as well as monitoring long-term side effects of that treatment, education regarding health maintenance, screening, and overall wellness and health promotion.    ? ?Overall, Tammy [name] reports feeling quite well.  She is taking Anastrozole daily and experiences hot flashes about three per day and at night time too.  She notes these started prior to taking anastrozole.  She is fatigued on occasion.  She denies any new issues. ? ?She is in a wheelchair today because she broke her hip.  She is working on rebuilding her  strength.  She says her left arm aches on occasion.  She does not notice any heaviness or weakness of that arm because she does have a history of stroke with left-sided weakness. ? ?REVIEW OF SYSTEMS:  ?Review of Systems  ?Constitutional:  Positive for fatigue. Negative for appetite  change, chills, fever and unexpected weight change.  ?HENT:   Negative for hearing loss, lump/mass and trouble swallowing.   ?Eyes:  Negative for eye problems and icterus.  ?Respiratory:  Negative for chest tightness, cough and shortness of breath.   ?Cardiovascular:  Negative for chest pain, leg swelling and palpitations.  ?Gastrointestinal:  Negative for abdominal distention, abdominal pain, constipation, diarrhea, nausea and vomiting.  ?Endocrine: Positive for hot flashes.  ?Genitourinary:  Negative for difficulty urinating.   ?Musculoskeletal:  Negative for arthralgias.  ?Skin:  Negative for itching and rash.  ?Neurological:  Positive for numbness. Negative for dizziness, extremity weakness and headaches.  ?Hematological:  Negative for adenopathy. Does not bruise/bleed easily.  ?Psychiatric/Behavioral:  Positive for depression. The patient is nervous/anxious.   ?Breast: Denies any new nodularity, masses, tenderness, nipple changes, or nipple discharge.  ? ? ? ? ?ONCOLOGY TREATMENT TEAM:  ?1. Surgeon:  Dr. [name] at [facility] ?2. Medical Oncologist: Dr. Jana Hakim ?3. Radiation Oncologist: Dr. [name] ?  ? ?PAST MEDICAL/SURGICAL HISTORY:  ?Past Medical History:  ?Diagnosis Date  ? Arthritis   ? Bipolar disorder ([place])   ? Breast cancer ([place])   ? Depression   ? Family history of breast cancer 07/10/2020  ? Family history of colon cancer 07/10/2020  ? Family history of ovarian cancer 07/10/2020  ? GERD (gastroesophageal reflux disease)   ? HTN (hypertension)   ? Stroke [facility]) 09/2012  ? slurred speech and left sided weakness/notes 10/04/2012  ? ?Past Surgical History:  ?Procedure Laterality Date  ? BREAST LUMPECTOMY WITH RADIOACTIVE SEED AND SENTINEL LYMPH NODE BIOPSY Left 07/26/2020  ? Procedure: LEFT BREAST LUMPECTOMY WITH RADIOACTIVE SEED AND SENTINEL LYMPH NODE BIOPSY;  Surgeon: [name], MD;  Location: [place];  Service: General;  Laterality: Left;  ? [place]   ? fracture of leg    ? 2 months ago  ? ORIF FEMUR FRACTURE Left 04/10/2021  ? Procedure: OPEN REDUCTION INTERNAL FIXATION (ORIF) DISTAL FEMUR FRACTURE;  Surgeon: Altamese  Bend, MD;  Location: [place];  Service: Orthopedics;  Laterality: Left;  ? ? ? ?ALLERGIES:  ?Allergies  ?Allergen Reactions  ? Lisinopril Cough  ? ? ? ?CURRENT MEDICATIONS:  ?Outpatient Encounter Medications as of 05/29/2021  ?Medication Sig  ? acetaminophen (TYLENOL) 325 MG tablet Take 1-2 tablets (325-650 mg total) by mouth every 4 (four) hours as needed for mild pain.  ? amLODipine (NORVASC) 10 MG tablet Take 1 tablet (10 mg total) by mouth daily.  ? anastrozole (ARIMIDEX) 1 MG tablet Take 1 tablet (1 mg total) by mouth daily.  ? ascorbic acid (VITAMIN C) 500 MG tablet Take 1 tablet (500 mg total) by mouth daily.  ? buPROPion (WELLBUTRIN SR) 150 MG 12 hr tablet Take 150 mg by mouth 2 (two) times daily.  ? gabapentin (NEURONTIN) 100 MG capsule Take 1 capsule (100 mg total) by mouth 2 (two) times daily.  ? hydrALAZINE (APRESOLINE) 25 MG tablet Take 1 tablet (25 mg total) by mouth 2 (two) times daily.  ? Multiple Vitamin (MULTIVITAMIN) TABS Take 1 tablet by mouth daily.  ? pantoprazole (PROTONIX) 40 MG tablet Take 1 tablet (40 mg total) by mouth daily.  ?  Vitamin D, Ergocalciferol, (DRISDOL) 1.25 MG (50000 UNIT) CAPS capsule Take 1 capsule (50,000 Units total) by mouth every 7 (seven) days.  ? magnesium gluconate (MAGONATE) 500 MG tablet Take 1 tablet (500 mg total) by mouth at bedtime. (Patient not taking: Reported on 05/29/2021)  ? zinc sulfate 220 (50 Zn) MG capsule Take 1 capsule (220 mg total) by mouth daily. (Patient not taking: Reported on 05/29/2021)  ? [DISCONTINUED] ELIQUIS 2.5 MG TABS tablet TAKE 1 TABLET(2.5 MG) BY MOUTH TWICE DAILY (Patient not taking: Reported on 05/29/2021)  ? ?No facility-administered encounter medications on file as of 05/29/2021.  ? ? ? ?ONCOLOGIC FAMILY HISTORY:  ?Family History  ?Problem Relation Age of Onset  ? Other  Mother   ?     blood clots  ? Diabetes Brother   ? Neuropathy Brother   ?     diabetic neuropathy  ? Diabetes Sister   ? Neuropathy Sister   ?     diabetic neuropathy  ? Diabetes Paternal Aunt   ? Breast cance

## 2021-05-30 ENCOUNTER — Telehealth: Payer: Self-pay | Admitting: Adult Health

## 2021-05-30 NOTE — Telephone Encounter (Signed)
.  Called patient to schedule appointment per 4/21 inbasket, patient is aware of date and time.   ?

## 2021-05-31 ENCOUNTER — Other Ambulatory Visit: Payer: Self-pay | Admitting: Physician Assistant

## 2021-06-17 ENCOUNTER — Ambulatory Visit (HOSPITAL_BASED_OUTPATIENT_CLINIC_OR_DEPARTMENT_OTHER)
Admission: RE | Admit: 2021-06-17 | Discharge: 2021-06-17 | Disposition: A | Discharge status: Home / Self Care | Payer: Medicare Other | Source: Ambulatory Visit | Attending: Adult Health | Admitting: Adult Health

## 2021-06-17 DIAGNOSIS — E2839 Other primary ovarian failure: Secondary | ICD-10-CM | POA: Diagnosis present

## 2021-06-18 ENCOUNTER — Telehealth: Payer: Self-pay

## 2021-06-18 NOTE — Telephone Encounter (Signed)
-----   Message from Gardenia Phlegm, NP sent at 06/17/2021  2:16 PM EDT ----- ?Bone density shows mild osteopenia.  REcommend calcium, vitamin d and weight bearing exercise.  ?----- Message ----- ?From: Interface, Rad Results In ?Sent: 06/17/2021  12:05 PM EDT ?To: Gardenia Phlegm, NP ? ? ?

## 2021-06-18 NOTE — Telephone Encounter (Signed)
Called and spoke with pt, per Mendel Ryder bone density testing shows mild osteopenia, encouraged pt to continue supplements of calcium, vit D, and weight bearing exercises once able since she recently broke her leg.  Pt verbalized understanding and thanks  ?

## 2021-07-01 ENCOUNTER — Other Ambulatory Visit: Payer: Self-pay | Admitting: Nurse Practitioner

## 2021-07-01 ENCOUNTER — Ambulatory Visit
Admission: RE | Admit: 2021-07-01 | Discharge: 2021-07-01 | Disposition: A | Discharge status: Home / Self Care | Payer: Medicare Other | Source: Ambulatory Visit | Attending: Adult Health | Admitting: Adult Health

## 2021-07-01 DIAGNOSIS — Z170 Estrogen receptor positive status [ER+]: Secondary | ICD-10-CM

## 2021-07-01 DIAGNOSIS — K7469 Other cirrhosis of liver: Secondary | ICD-10-CM

## 2021-07-08 ENCOUNTER — Ambulatory Visit
Admission: RE | Admit: 2021-07-08 | Discharge: 2021-07-08 | Disposition: A | Discharge status: Home / Self Care | Payer: Medicare Other | Source: Ambulatory Visit | Attending: Nurse Practitioner | Admitting: Nurse Practitioner

## 2021-07-08 DIAGNOSIS — K7469 Other cirrhosis of liver: Secondary | ICD-10-CM

## 2021-07-09 ENCOUNTER — Ambulatory Visit: Payer: Medicare Other | Admitting: Rehabilitation

## 2021-07-16 ENCOUNTER — Encounter: Payer: Self-pay | Admitting: Rehabilitation

## 2021-07-16 ENCOUNTER — Ambulatory Visit: Payer: Medicare Other | Attending: Adult Health | Admitting: Rehabilitation

## 2021-07-16 DIAGNOSIS — C50412 Malignant neoplasm of upper-outer quadrant of left female breast: Secondary | ICD-10-CM | POA: Insufficient documentation

## 2021-07-16 DIAGNOSIS — Z170 Estrogen receptor positive status [ER+]: Secondary | ICD-10-CM | POA: Insufficient documentation

## 2021-07-16 DIAGNOSIS — M6281 Muscle weakness (generalized): Secondary | ICD-10-CM | POA: Insufficient documentation

## 2021-07-16 DIAGNOSIS — R262 Difficulty in walking, not elsewhere classified: Secondary | ICD-10-CM | POA: Insufficient documentation

## 2021-07-16 DIAGNOSIS — I890 Lymphedema, not elsewhere classified: Secondary | ICD-10-CM | POA: Insufficient documentation

## 2021-07-16 DIAGNOSIS — M25662 Stiffness of left knee, not elsewhere classified: Secondary | ICD-10-CM | POA: Diagnosis present

## 2021-07-16 DIAGNOSIS — S72402S Unspecified fracture of lower end of left femur, sequela: Secondary | ICD-10-CM | POA: Diagnosis present

## 2021-07-16 DIAGNOSIS — Z483 Aftercare following surgery for neoplasm: Secondary | ICD-10-CM | POA: Insufficient documentation

## 2021-07-16 DIAGNOSIS — R293 Abnormal posture: Secondary | ICD-10-CM | POA: Diagnosis present

## 2021-07-16 DIAGNOSIS — E2839 Other primary ovarian failure: Secondary | ICD-10-CM | POA: Insufficient documentation

## 2021-07-16 NOTE — Therapy (Signed)
OUTPATIENT PHYSICAL THERAPY ONCOLOGY EVALUATION  Patient Name: Tammy [name] MRN: [id number] DOB:12/13/1955, 66 y.o., female Today's Date: 07/16/2021   PT End of Session - 07/16/21 2020     Visit Number 3    Number of Visits 3    Date for PT Re-Evaluation 08/13/21    PT Start Time 1406    PT Stop Time 1456    PT Time Calculation (min) 50 min    Activity Tolerance Patient tolerated treatment well    Behavior During Therapy WFL for tasks assessed/performed             Past Medical History:  Diagnosis Date   Arthritis    Bipolar disorder ([facility])    Breast cancer (H[facility])    Depression    Family history of breast cancer 07/10/2020   Family history of colon cancer 07/10/2020   Family history of ovarian cancer 07/10/2020   GERD (gastroesophageal reflux disease)    HTN (hypertension)    Stroke ([facility]) 09/2012   slurred speech and left sided weakness/notes 10/04/2012   Past Surgical History:  Procedure Laterality Date   BREAST LUMPECTOMY WITH RADIOACTIVE SEED AND SENTINEL LYMPH NODE BIOPSY Left 07/26/2020   Procedure: LEFT BREAST LUMPECTOMY WITH RADIOACTIVE SEED AND SENTINEL LYMPH NODE BIOPSY;  Surgeon: Blackman, Douglas, MD;  Location: [facility];  Service: General;  Laterality: Left;   CESAREAN SECTION  1986   fracture of leg     2 months ago   ORIF FEMUR FRACTURE Left 04/10/2021   Procedure: OPEN REDUCTION INTERNAL FIXATION (ORIF) DISTAL FEMUR FRACTURE;  Surgeon: Handy, Michael, MD;  Location: [facility];  Service: Orthopedics;  Laterality: Left;   Patient Active Problem List   Diagnosis Date Noted   Hypokalemia 04/16/2021   Hip fracture requiring operative repair (HCC) 04/16/2021   Vitamin D deficiency 04/12/2021   Hyponatremia 04/11/2021   Acute postoperative anemia due to expected blood loss 04/11/2021   Class 1 obesity 04/11/2021   Left comminuted and displaced fracture of the distal femur metaphysis 04/08/2021   Anemia 04/08/2021   History of CVA  (cerebrovascular accident) 04/08/2021   Genetic testing 07/17/2020   Family history of breast cancer 07/10/2020   Family history of ovarian cancer 07/10/2020   Family history of colon cancer 07/10/2020   Malignant neoplasm of upper-outer quadrant of left breast in female, estrogen receptor positive (HCC) 07/09/2020   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   Chronic pain syndrome 10/06/2012   Nicotine dependence 10/06/2012   Anxiety state, unspecified 10/05/2012   Bipolar disorder, unspecified (HCC) 10/05/2012   Slurred speech 10/04/2012   Left-sided weakness 10/04/2012   Hyperkalemia 10/04/2012   Acute CVA (cerebrovascular accident) (HCC) 10/04/2012   HTN (hypertension)     PCP: Dr. Hassan  REFERRING PROVIDER: Lindsey Causey, NP  REFERRING DIAG: breast swelling  THERAPY DIAG:  Aftercare following surgery for neoplasm  Abnormal posture  Malignant neoplasm of upper-outer quadrant of left breast in female, estrogen receptor positive (HCC)  Lymphedema, not elsewhere classified  Rationale for Evaluation and Treatment Rehabilitation  ONSET DATE: 07/26/20  SUBJECTIVE                                                                                                                                                                                         SUBJECTIVE STATEMENT: I am not sure I need to be here.  I think I had just mentioned something to them.  I just fractured my hip and need to start PT for that.   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 She underwent a left lumpectomy and sentinel node bipsy (3 negative nodes) on 07/26/2020. It is ER/PR positive and HER2 negative with a Ki67 of 5%.   PAIN:  Are you having pain? No  PRECAUTIONS: WBAT Lt LE and lymphedema risk Lt LUE  FALLS:  Has patient fallen in last 6 months? Yes. Number of falls 1  LIVING ENVIRONMENT: Lives with: lives with their  spouse  OCCUPATION: retired  LEISURE: walking  HAND DOMINANCE : right   PRIOR LEVEL OF FUNCTION: Independent  PATIENT GOALS anything I need to do   OBJECTIVE COGNITION:  Overall cognitive status: Within functional limits for tasks assessed   PALPATION: Soft edema vs extra skin lateral trunk, enlarged pores and peau de'orange skin inferior left breast with fibrosis inferior breast.   OBSERVATIONS / OTHER ASSESSMENTS: wearing compression bra, divot type mark from incision/increased scar tissue  UPPER EXTREMITY AROM/PROM:  A/PROM RIGHT   eval    Shoulder extension 56   Shoulder flexion 158   Shoulder abduction 158   Shoulder internal rotation 73   Shoulder external rotation 81     (Blank rows = not tested)  A/PROM LEFT   07/10/20 07/16/21  Shoulder extension 59 55  Shoulder flexion 154 155  Shoulder abduction 158 163  Shoulder internal rotation 80   Shoulder external rotation 78 85    (Blank rows = not tested)  L-DEX LYMPHEDEMA SCREENING: performed today and WNL - restarted 3 month screenings  TODAY'S TREATMENT  Pt permission and consent throughout each step of examination and treatment with modification and draping if requested when working on sensitive areas  07/16/21: education on left breast self MLD to address fibrosis of the inferior breast.  Made chip pack for use in compression bra. Performed seated: PT read and demonstrated all steps and then Pt performed with changes as needed.  Per instruction section below without changes made. Pt performed moderately well today  PATIENT EDUCATION:  Education details: self MLD Lt breast Person educated: Patient and Spouse Education method: Explanation, Demonstration, Tactile cues, Verbal cues, and Handouts Education comprehension: verbalized understanding, returned demonstration, verbal cues required, and tactile cues required  HOME EXERCISE PROGRAM: Self MLD lt breast  ASSESSMENT:  CLINICAL IMPRESSION: Patient is a 66  y.o. female who was seen today for physical therapy evaluation and treatment for her left breast cancer recovery. Pt realized that she "fell off" from her PT surveillance so she returned today for reassessment.  She does have some mild Lt breast lymphedema so pt was given chip pack and education on self MLD that she would prefer to attempt ind for now due to recent femur fracture and needing to rehab this as top priority.  Pt has returned to baseline AROM, feels like she has full function and no breast pain.  Pt knows she can return for review or assistance with breast lymphedema/MLD.    OBJECTIVE IMPAIRMENTS increased edema.   ACTIVITY LIMITATIONS none  PARTICIPATION LIMITATIONS: none  PERSONAL FACTORS 1-2 comorbidities: radiation and SLNB hx  are also affecting patient's functional outcome.   REHAB POTENTIAL: Excellent  CLINICAL DECISION MAKING: Stable/uncomplicated  EVALUATION COMPLEXITY: Low  GOALS: Goals reviewed with patient? Yes  SHORT  TERM GOALS: Target date: 07/16/21  Pt will education on self MLD for the Lt breast and use of foam inserts Baseline: Goal status: MET  2.  Pt will resume SOZO screenings for lymphedema surveillance Baseline:  Goal status: MET PLAN: PT FREQUENCY: one time visit and SOZO  PT DURATION: 1 session  PLANNED INTERVENTIONS: Patient/Family education and Manual therapy  PLAN FOR NEXT SESSION: continue 3 month SOZO screenings until at least 07/27/2022   Stark Bray, PT 07/16/2021, 8:21 PM  Manual Lymph Drainage for Left Breast.   Do daily.  Do slowly. Use flat hands with just enough pressure to stretch the skin. Do not slide over the skin, stretch the skin with the hand. (Stretch  Relax  Move) Lie down or sit comfortably (in a recliner, for example) to do this.   Do circles at each collar bone near neck 5-7 times (to "wake up" lots of lymph nodes in this area).  Take slow deep breaths, allowing your belly to balloon out as you breathe in,  5x (to "wake up" abdominal lymph nodes).  Both armpits--stretch skin in small circles to stimulate intact lymph nodes there, 5-7x.  Left groin area, at panty line--stretch skin in small circles to stimulate lymph nodes 5-7x.  Redirect fluid from left chest toward right armpit (stretch skin starting at left chest in 3-4 spots working toward right armpit) 3-4x across the chest.  Redirect fluid from left armpit toward left groin (cup your hand around the curve of your left side and do 3-4 "pumps" from armpit to groin) 3-4x down your side.  Draw an imaginary diagonal line from upper outer breast through the nipple area toward lower inner breast.  Direct fluid upward and inward from this line toward the pathway across your upper chest (established in #5).  Do this in three rows to treat all the upper inner breast and do each row 3-4x.  Then direct the fluid down and out from this line toward the pathway down your side going towards the left groin. Do this in three rows and do each row 3-4x. (established in #6)   Then repeat your pathways (#5 and #6)  End with repeating #3 and #4 above. (circles in both armpits and the left groin)

## 2021-07-17 NOTE — Therapy (Signed)
OUTPATIENT PHYSICAL THERAPY LOWER EXTREMITY EVALUATION   Patient Name: Tammy [name] MRN: [id number] DOB:29-Nov-1955, 66 y.o., female Today's Date: 07/18/2021   PT End of Session - 07/18/21 0757     Visit Number 1    Number of Visits 16    Date for PT Re-Evaluation 09/12/21    Authorization Type UHC    PT Start Time 0800    PT Stop Time 0845    PT Time Calculation (min) 45 min    Activity Tolerance Patient tolerated treatment well    Behavior During Therapy [facility] for tasks assessed/performed             Past Medical History:  Diagnosis Date   Arthritis    Bipolar disorder ([place])    Breast cancer ([place])    Depression    Family history of breast cancer 07/10/2020   Family history of colon cancer 07/10/2020   Family history of ovarian cancer 07/10/2020   GERD (gastroesophageal reflux disease)    HTN (hypertension)    Stroke ([place]) 09/2012   slurred speech and left sided weakness/notes 10/04/2012   Past Surgical History:  Procedure Laterality Date   BREAST LUMPECTOMY WITH RADIOACTIVE SEED AND SENTINEL LYMPH NODE BIOPSY Left 07/26/2020   Procedure: LEFT BREAST LUMPECTOMY WITH RADIOACTIVE SEED AND SENTINEL LYMPH NODE BIOPSY;  Surgeon: [name], MD;  Location: [place];  Service: General;  Laterality: Left;   [place]   fracture of leg     2 months ago   ORIF FEMUR FRACTURE Left 04/10/2021   Procedure: OPEN REDUCTION INTERNAL FIXATION (ORIF) DISTAL FEMUR FRACTURE;  Surgeon: Altamese Tolleson, MD;  Location: [place];  Service: Orthopedics;  Laterality: Left;   Patient Active Problem List   Diagnosis Date Noted   Hypokalemia 04/16/2021   Hip fracture requiring operative repair ([place]) 04/16/2021   Vitamin D deficiency 04/12/2021   Hyponatremia 04/11/2021   Acute postoperative anemia due to expected blood loss 04/11/2021   Class 1 obesity 04/11/2021   Left comminuted and displaced fracture of the distal femur metaphysis 04/08/2021    Anemia 04/08/2021   History of CVA (cerebrovascular accident) 04/08/2021   Genetic testing 07/17/2020   Family history of breast cancer 07/10/2020   Family history of ovarian cancer 07/10/2020   Family history of colon cancer 07/10/2020   Malignant neoplasm of upper-outer quadrant of left breast in female, estrogen receptor positive ([place]) 07/09/2020   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   Chronic pain syndrome 10/06/2012   Nicotine dependence 10/06/2012   Anxiety state, unspecified 10/05/2012   Bipolar disorder, unspecified ([place]) 10/05/2012   Slurred speech 10/04/2012   Left-sided weakness 10/04/2012   Hyperkalemia 10/04/2012   Acute CVA (cerebrovascular accident) ([place]) 10/04/2012   HTN (hypertension)     PCP: [name] MD  REFERRING PROVIDER: Ainsley Spinner PA-C  REFERRING DIAG: Lt distal femur fracture  THERAPY DIAG:  Stiffness of left knee, not elsewhere classified  Closed fracture of distal end of left femur, unspecified fracture morphology, sequela  Muscle weakness (generalized)  Rationale for Evaluation and Treatment Rehabilitation  ONSET DATE: 04/08/21  SUBJECTIVE:   SUBJECTIVE STATEMENT: Patient is presenting to PT with L knee pain following ORIF L distal femur.  She went to Inpatient rehab for a few days but did not have HHPT.  She and her partner have been working on her knee at home. She does her exercises a few times per week.  She only really has pain when bending her knee.  Going up the stairs is somewhat hard but she can down alright.  She has begun working on walking with a cane in her home but uses a walker today.   PERTINENT HISTORY:  Tammy [name] is a 66 year old female who was in her usual state of good health when she fell in a store parking lot on 04/08/2021.  She tripped on a concrete block and needed help getting up from a bystander who called EMS.  She was brought to [facility] emergency department where imaging revealed comminuted and  displaced fracture of distal left femur metaphysis.  Orthopedic surgery was consulted and recommended transfer to [facility] due to lack of operating room availability at [facility]. She had no other injuries. She was on no blood thinners. Dr. Marcelino Scot was consulted and the patient was taken to the OR on 3/2 and underwent ORIF of left distal femur with intercondylar extension.  Found to be hypokalemic on follow-up blood work yesterday and this was repleted.  Anemia noted with hemoglobin of 8.2.  Iron studies pending.   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 Jaynee Eagles.  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 History of hypertension and bipolar disorder.  PAIN:  Are you having pain? Yes: NPRS scale: 2/10 Pain location: L posterior knee Pain description: achy, stiffness Aggravating factors: bending her knee,  walking and standing too long Relieving factors: resting, meds, propping it , rubbing it   PRECAUTIONS: None  WEIGHT BEARING RESTRICTIONS  WBAT  FALLS:  Has patient fallen in last 6 months? Yes. Number of falls 1  LIVING ENVIRONMENT: Lives with: lives with their partner Lives in: House/apartment Stairs: Yes: External: 8 steps; on right going up Has following equipment at home: Single point cane and Walker - 2 wheeled  OCCUPATION: Therapist, sports, retiered  PLOF: Independent with basic ADLs, Independent with household mobility with device, Independent with community mobility with device, Independent with transfers, Needs assistance with homemaking, and Leisure: travelling  PATIENT GOALS I want to be able to walk independently   OBJECTIVE:   DIAGNOSTIC FINDINGS: none   PATIENT SURVEYS:  FOTO NT   COGNITION:  Overall cognitive status: Within  functional limits for tasks assessed     SENSATION: WFL  EDEMA:  Circumferential: Rt   35 cm Lt.  36 cm   MUSCLE LENGTH: Hamstrings: tight bilateral  Thomas test: tight bilateral   POSTURE: weight shift right and genu valgus, pes planus   PALPATION: Tender to palpation with patellar mobs, posterior lateral  LOWER EXTREMITY ROM:  Passive ROM Right eval Left eval  Hip flexion    Hip extension    Hip abduction    Hip adduction    Hip internal rotation  50  Hip external rotation  40 pain   Knee flexion 132 96 AAROM  Knee extension -8 -8 deg   Ankle dorsiflexion    Ankle plantarflexion    Ankle inversion    Ankle eversion     (Blank rows = not tested)  LOWER EXTREMITY MMT:  MMT Right eval Left eval  Hip flexion 4/5 4-/5  Hip extension  4/5  Hip abduction  3+/5  Hip adduction    Hip internal rotation    Hip external rotation    Knee flexion 5/5 4+/5  Knee extension 5/5 4/5  Ankle dorsiflexion 5/5 5/5  Ankle plantarflexion    Ankle inversion    Ankle eversion     (Blank rows = not tested)  FUNCTIONAL TESTS:  5 times sit to stand: 17 sec   GAIT: Distance walked: 210  Assistive device utilized: Environmental consultant - 2 wheeled Level of assistance: Modified independence Comments: 2 min walk test     TODAY'S TREATMENT: PT eval and HEP, POC    PATIENT EDUCATION:  Education details: HEP, POC Person educated: Patient Education method: Explanation, Demonstration, and Handouts Education comprehension: verbalized understanding and needs further education   HOME EXERCISE PROGRAM: Access Code: [email or web address] URL: [email or web address] Date: 07/18/2021 Prepared by: Raeford Razor  Exercises - Supine Hamstring Stretch with Strap  - 2 x daily - 7 x weekly - 1 sets - 5 reps - 20 hold - Supine Heel Slide with Strap  - 2 x daily - 7 x weekly - 1 sets - 10 reps - 20 hold - Supine Single Knee to Chest Stretch  - 2 x daily - 7 x weekly - 1 sets - 5 reps - 30 hold -  Supine Bridge  - 2 x daily - 7 x weekly - 2 sets - 10 reps - 5 hold - Sit to Stand Without Arm Support  - 2 x daily - 7 x weekly - 2 sets - 10 reps - 5 hold  ASSESSMENT:  CLINICAL IMPRESSION: Patient is a 66 y.o. female who was seen today for physical therapy evaluation and treatment for L distal femur fracture. She will cont with cancer rehab for lymphedema q. 3 months.  She has significant malalignment of ankle, knee and hip.  Encouraged more supportive footwear. Educated about moving seat back to improve comfort with car transfers.  She will benefit from skilled PT in order to work toward goal of independent gait.    OBJECTIVE IMPAIRMENTS decreased activity tolerance, decreased mobility, difficulty walking, decreased ROM, decreased strength, hypomobility, increased edema, increased fascial restrictions, impaired flexibility, and postural dysfunction.   ACTIVITY LIMITATIONS carrying, lifting, bending, standing, squatting, sleeping, stairs, transfers, bed mobility, and locomotion level  PARTICIPATION LIMITATIONS: meal prep, cleaning, laundry, interpersonal relationship, driving, shopping, community activity, and church  PERSONAL FACTORS Time since onset of injury/illness/exacerbation and 1-2 comorbidities: cancer and history of L sided weakness after stroke   are also affecting patient's functional outcome.   REHAB POTENTIAL: Excellent  CLINICAL DECISION MAKING: Stable/uncomplicated  EVALUATION COMPLEXITY: Low   GOALS: Goals reviewed with patient? Yes  SHORT TERM GOALS: Target date: 08/08/2021  Pt will be able to walk in her home with cane without increased pain  Baseline: beginning to use cane vs RW Goal status: INITIAL  2.  Pt will be screened for balance and goal set if needed  Baseline: NT on eval  Goal status: INITIAL  3.  Pt will be able to show independence with HEP for mobility and strength Baseline:  Goal status: INITIAL   LONG TERM GOALS: Target date: 09/12/2021    Pt will be able to walk up and down stairs to enter her home with just 1 rail, reciprocal pattern.  Baseline: leads with Rt  Goal status: INITIAL  2.  Pt will be able to show independence with HEP for LLE  Baseline: given on eval  Goal status: INITIAL  3.  Pt will demo 5/5 strength on LLE for maximal gait stability  Baseline: 4/5, see above  Goal status: INITIAL  4.  Pt will walk without the cane in the home  with confidence and no increased pain .  Baseline:  Goal status: INITIAL  5.  Pt will bend L knee to 110 deg in sitting for more normal transfers Baseline:  Goal status: INITIAL   PLAN: PT FREQUENCY: 2x/week  PT DURATION: 8 weeks  PLANNED INTERVENTIONS: Therapeutic exercises, Therapeutic activity, Neuromuscular re-education, Balance training, Gait training, Patient/Family education, Joint mobilization, Stair training, DME instructions, Electrical stimulation, Cryotherapy, Moist heat, Taping, Vasopneumatic device, Manual therapy, and Re-evaluation  PLAN FOR NEXT SESSION: check HEP, gait, manual and Nustep  Raeford Razor, PT 07/18/21 12:57 PM Phone: [phone] Fax: [phone]  Kaysha Parsell, PT 07/18/2021, 12:38 PM

## 2021-07-18 ENCOUNTER — Ambulatory Visit: Payer: Medicare Other | Admitting: Physical Therapy

## 2021-07-18 DIAGNOSIS — M25662 Stiffness of left knee, not elsewhere classified: Secondary | ICD-10-CM

## 2021-07-18 DIAGNOSIS — S72402S Unspecified fracture of lower end of left femur, sequela: Secondary | ICD-10-CM

## 2021-07-18 DIAGNOSIS — M6281 Muscle weakness (generalized): Secondary | ICD-10-CM

## 2021-07-18 DIAGNOSIS — Z483 Aftercare following surgery for neoplasm: Secondary | ICD-10-CM | POA: Diagnosis not present

## 2021-07-21 ENCOUNTER — Other Ambulatory Visit: Payer: Self-pay | Admitting: Internal Medicine

## 2021-07-21 DIAGNOSIS — M8580 Other specified disorders of bone density and structure, unspecified site: Secondary | ICD-10-CM

## 2021-07-29 ENCOUNTER — Ambulatory Visit: Payer: Medicare Other

## 2021-08-06 ENCOUNTER — Ambulatory Visit: Payer: Medicare Other

## 2021-08-06 DIAGNOSIS — M25662 Stiffness of left knee, not elsewhere classified: Secondary | ICD-10-CM

## 2021-08-06 DIAGNOSIS — Z483 Aftercare following surgery for neoplasm: Secondary | ICD-10-CM | POA: Diagnosis not present

## 2021-08-06 DIAGNOSIS — R262 Difficulty in walking, not elsewhere classified: Secondary | ICD-10-CM

## 2021-08-06 DIAGNOSIS — M6281 Muscle weakness (generalized): Secondary | ICD-10-CM

## 2021-08-06 NOTE — Therapy (Signed)
OUTPATIENT PHYSICAL THERAPY TREATMENT NOTE   Patient Name: Tammy [name] MRN: [id number] DOB:31-Dec-1955, 66 y.o., female Today's Date: 08/06/2021  PCP: Tammy Jewel, MD REFERRING PROVIDER: Ainsley Spinner PA-C  END OF SESSION:   PT End of Session - 08/06/21 0941     Visit Number 2    Number of Visits 16    Date for PT Re-Evaluation 09/12/21    Authorization Type UHC    PT Start Time 7322    PT Stop Time 1017    PT Time Calculation (min) 40 min    Activity Tolerance Patient tolerated treatment well    Behavior During Therapy [facility] for tasks assessed/performed             Past Medical History:  Diagnosis Date   Arthritis    Bipolar disorder ([place])    Breast cancer ([place])    Depression    Family history of breast cancer 07/10/2020   Family history of colon cancer 07/10/2020   Family history of ovarian cancer 07/10/2020   GERD (gastroesophageal reflux disease)    HTN (hypertension)    Stroke ([place]) 09/2012   slurred speech and left sided weakness/notes 10/04/2012   Past Surgical History:  Procedure Laterality Date   BREAST LUMPECTOMY WITH RADIOACTIVE SEED AND SENTINEL LYMPH NODE BIOPSY Left 07/26/2020   Procedure: LEFT BREAST LUMPECTOMY WITH RADIOACTIVE SEED AND SENTINEL LYMPH NODE BIOPSY;  Surgeon: Tammy Keens, MD;  Location: [place];  Service: General;  Laterality: Left;   [place]   fracture of leg     2 months ago   ORIF FEMUR FRACTURE Left 04/10/2021   Procedure: OPEN REDUCTION INTERNAL FIXATION (ORIF) DISTAL FEMUR FRACTURE;  Surgeon: Tammy [place], MD;  Location: [place];  Service: Orthopedics;  Laterality: Left;   Patient Active Problem List   Diagnosis Date Noted   Hypokalemia 04/16/2021   Hip fracture requiring operative repair ([place]) 04/16/2021   Vitamin D deficiency 04/12/2021   Hyponatremia 04/11/2021   Acute postoperative anemia due to expected blood loss 04/11/2021   Class 1 obesity 04/11/2021   Left comminuted  and displaced fracture of the distal femur metaphysis 04/08/2021   Anemia 04/08/2021   History of CVA (cerebrovascular accident) 04/08/2021   Genetic testing 07/17/2020   Family history of breast cancer 07/10/2020   Family history of ovarian cancer 07/10/2020   Family history of colon cancer 07/10/2020   Malignant neoplasm of upper-outer quadrant of left breast in female, estrogen receptor positive ([place]) 07/09/2020   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   Chronic pain syndrome 10/06/2012   Nicotine dependence 10/06/2012   Anxiety state, unspecified 10/05/2012   Bipolar disorder, unspecified ([place]) 10/05/2012   Slurred speech 10/04/2012   Left-sided weakness 10/04/2012   Hyperkalemia 10/04/2012   Acute CVA (cerebrovascular accident) ([place]) 10/04/2012   HTN (hypertension)     REFERRING DIAG: Lt distal femur fracture  THERAPY DIAG:  Stiffness of left knee, not elsewhere classified  Muscle weakness (generalized)  Difficulty in walking, not elsewhere classified  Rationale for Evaluation and Treatment   ONSET DATE: 04/08/21   SUBJECTIVE:    SUBJECTIVE STATEMENT: Pt reports she is getting better. She notes she is now using a SPC part of the time. She is using a RW when she wakes up and going to the bathroom at night.   PAIN:  Are you having pain? Yes: NPRS scale: 4/10 Pain location: L posterior knee Pain description: achy, stiffness Aggravating factors: bending her knee,  walking and  standing too long Relieving factors: resting, meds, propping it , rubbing it   PERTINENT HISTORY:  Tammy [name] is a 66 year old female who was in her usual state of good health when she fell in a store parking lot on 04/08/2021.  She tripped on a concrete block and needed help getting up from a bystander who called EMS.  She was brought to [facility] emergency department where imaging revealed comminuted and displaced fracture of distal left femur metaphysis.  Orthopedic surgery was  consulted and recommended transfer to [facility] due to lack of operating room availability at [facility]. She had no other injuries. She was on no blood thinners. Dr. Marcelino [name] was consulted and the patient was taken to the OR on 3/2 and underwent ORIF of left distal femur with intercondylar extension.  Found to be hypokalemic on follow-up blood work yesterday and this was repleted.  Anemia noted with hemoglobin of 8.2.  Iron studies pending.   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 Jaynee [name].  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 History of hypertension and bipolar disorder.   PRECAUTIONS: None   WEIGHT BEARING RESTRICTIONS  WBAT   PATIENT GOALS I want to be able to walk independently   OBJECTIVE: (objective measures completed at initial evaluation unless otherwise dated):    DIAGNOSTIC FINDINGS: none    PATIENT SURVEYS:  FOTO NT    COGNITION:           Overall cognitive status: Within functional limits for tasks assessed                          SENSATION: WFL   EDEMA:  Circumferential: Rt   35 cm Lt.  36 cm    MUSCLE LENGTH: Hamstrings: tight bilateral  Thomas test: tight bilateral    POSTURE: weight shift right and genu valgus, pes planus    PALPATION: Tender to palpation with patellar mobs, posterior lateral   LOWER EXTREMITY ROM:   Passive ROM Right eval Left eval Lt 08/06/21  Hip flexion       Hip extension       Hip abduction       Hip adduction       Hip internal rotation   50   Hip external rotation   40 pain    Knee flexion 132 96 AAROM 100 AAROM  Knee extension -8 -8 deg    Ankle dorsiflexion       Ankle plantarflexion       Ankle inversion       Ankle eversion        (Blank rows = not tested)    LOWER EXTREMITY MMT:   MMT Right eval Left eval  Hip flexion 4/5 4-/5  Hip extension   4/5  Hip abduction   3+/5  Hip adduction      Hip internal rotation      Hip external rotation      Knee flexion 5/5 4+/5  Knee extension 5/5 4/5  Ankle dorsiflexion 5/5 5/5  Ankle plantarflexion      Ankle inversion      Ankle eversion       (Blank rows = not tested)   FUNCTIONAL TESTS:  5 times sit to stand: 17 sec  GAIT: Distance walked: 210  Assistive device utilized: Environmental consultant - 2 wheeled Level of assistance: Modified independence Comments: 2 min walk test      TODAY'S TREATMENT: [facility] PT Treatment:                                                DATE: 08/06/21 Therapeutic Exercise: Nustep 5 mins L4 UE/LE Quad set (wash clothe under knee) f/b SLR x15  Bridging attempted, but stopped due to report of knee pain Heel slide x10 f/b assisted heel slide knee flexion stretch x5 20 " Supine hip clams 2x10 GTB STS s hands 2x10 Updated HEP  Eval Treatment: PT eval and HEP, POC      PATIENT EDUCATION:  Education details: HEP, POC Person educated: Patient Education method: Explanation, Demonstration, and Handouts Education comprehension: verbalized understanding and needs further education     HOME EXERCISE PROGRAM: Access Code: [email or web address] URL: [email or web address] Date: 08/06/2021 Prepared by: Gar Ponto  Exercises - Supine Hamstring Stretch with Strap  - 2 x daily - 7 x weekly - 1 sets - 5 reps - 20 hold - Supine Heel Slide with Strap  - 2 x daily - 7 x weekly - 1 sets - 10 reps - 20 hold - Supine Single Knee to Chest Stretch  - 2 x daily - 7 x weekly - 1 sets - 5 reps - 30 hold - Supine Bridge  - 2 x daily - 7 x weekly - 2 sets - 10 reps - 5 hold - Sit to Stand Without Arm Support  - 2 x daily - 7 x weekly - 2 sets - 10 reps - 5 hold - Active Straight Leg Raise with Quad Set  - 1 x daily - 7 x weekly - 2 sets - 10 reps - 5 hold - Hooklying Clamshell with  Resistance  - 1 x daily - 7 x weekly - 2 sets - 10 reps - 5 hold   ASSESSMENT:   CLINICAL IMPRESSION: Pt returns to Pt for her first visit after th initial eval. Pt presents walking with a SPC. PT demonstrates appropriate balance. Pt has a min/mod limp over the L LE PT was completed for therex to address ROM and strength. L knee flexion was min improved at 100d. Pt tolerated today's session without adverse effects.   OBJECTIVE IMPAIRMENTS decreased activity tolerance, decreased mobility, difficulty walking, decreased ROM, decreased strength, hypomobility, increased edema, increased fascial restrictions, impaired flexibility, and postural dysfunction.     GOALS: Goals reviewed with patient? Yes   SHORT TERM GOALS: Target date: 08/08/2021  Pt will be able to walk in her home with cane without increased pain  Baseline: beginning to use cane vs RW Goal status: INITIAL   2.  Pt will be screened for balance and goal set if needed  Baseline: NT on eval  Goal status: INITIAL   3.  Pt will be able to show independence with HEP for mobility and strength Baseline:  Goal status: INITIAL     LONG TERM GOALS: Target date: 09/12/2021    Pt will be able to walk up and down stairs to enter her home with just 1 rail, reciprocal pattern.  Baseline: leads with Rt  Goal status: INITIAL   2.  Pt will be able to show independence with HEP for LLE  Baseline: given on eval  Goal status: INITIAL   3.  Pt will demo 5/5 strength on LLE for maximal gait stability  Baseline: 4/5, see above  Goal status: INITIAL   4.  Pt will walk without the cane in the home with confidence and no increased pain .  Baseline:  Goal status: INITIAL   5.  Pt will bend L knee to 110 deg in sitting for more normal transfers Baseline:  Goal status: INITIAL     PLAN: PT FREQUENCY: 2x/week   PT DURATION: 8 weeks   PLANNED INTERVENTIONS: Therapeutic exercises, Therapeutic activity, Neuromuscular re-education, Balance  training, Gait training, Patient/Family education, Joint mobilization, Stair training, DME instructions, Electrical stimulation, Cryotherapy, Moist heat, Taping, Vasopneumatic device, Manual therapy, and Re-evaluation   PLAN FOR NEXT SESSION: check HEP, gait, manual and [employer], PT 08/06/2021, 9:38 PM

## 2021-08-07 NOTE — Therapy (Signed)
OUTPATIENT PHYSICAL THERAPY TREATMENT NOTE   Patient Name: Tammy [name] MRN: [id number] DOB:09-10-1955, 66 y.o., female Today's Date: 08/08/2021  PCP: [name], MD REFERRING PROVIDER: Ainsley Spinner PA-C  END OF SESSION:   PT End of Session - 08/08/21 0858     Visit Number 3    Number of Visits 16    Date for PT Re-Evaluation 09/12/21    Authorization Type UHC    PT Start Time 0852    PT Stop Time 0932    PT Time Calculation (min) 40 min    Activity Tolerance Patient tolerated treatment well    Behavior During Therapy [facility] for tasks assessed/performed              Past Medical History:  Diagnosis Date   Arthritis    Bipolar disorder ([place])    Breast cancer ([place])    Depression    Family history of breast cancer 07/10/2020   Family history of colon cancer 07/10/2020   Family history of ovarian cancer 07/10/2020   GERD (gastroesophageal reflux disease)    HTN (hypertension)    Stroke ([place]) 09/2012   slurred speech and left sided weakness/notes 10/04/2012   Past Surgical History:  Procedure Laterality Date   BREAST LUMPECTOMY WITH RADIOACTIVE SEED AND SENTINEL LYMPH NODE BIOPSY Left 07/26/2020   Procedure: LEFT BREAST LUMPECTOMY WITH RADIOACTIVE SEED AND SENTINEL LYMPH NODE BIOPSY;  Surgeon: [name], MD;  Location: [place];  Service: General;  Laterality: Left;   [place]   fracture of leg     2 months ago   ORIF FEMUR FRACTURE Left 04/10/2021   Procedure: OPEN REDUCTION INTERNAL FIXATION (ORIF) DISTAL FEMUR FRACTURE;  Surgeon: Altamese Newell, MD;  Location: [place];  Service: Orthopedics;  Laterality: Left;   Patient Active Problem List   Diagnosis Date Noted   Hypokalemia 04/16/2021   Hip fracture requiring operative repair ([place]) 04/16/2021   Vitamin D deficiency 04/12/2021   Hyponatremia 04/11/2021   Acute postoperative anemia due to expected blood loss 04/11/2021   Class 1 obesity 04/11/2021   Left comminuted  and displaced fracture of the distal femur metaphysis 04/08/2021   Anemia 04/08/2021   History of CVA (cerebrovascular accident) 04/08/2021   Genetic testing 07/17/2020   Family history of breast cancer 07/10/2020   Family history of ovarian cancer 07/10/2020   Family history of colon cancer 07/10/2020   Malignant neoplasm of upper-outer quadrant of left breast in female, estrogen receptor positive ([place]) 07/09/2020   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   Chronic pain syndrome 10/06/2012   Nicotine dependence 10/06/2012   Anxiety state, unspecified 10/05/2012   Bipolar disorder, unspecified ([place]) 10/05/2012   Slurred speech 10/04/2012   Left-sided weakness 10/04/2012   Hyperkalemia 10/04/2012   Acute CVA (cerebrovascular accident) ([place]) 10/04/2012   HTN (hypertension)     REFERRING DIAG: Lt distal femur fracture  THERAPY DIAG:  Stiffness of left knee, not elsewhere classified  Muscle weakness (generalized)  Difficulty in walking, not elsewhere classified  Rationale for Evaluation and Treatment   ONSET DATE: 04/08/21   SUBJECTIVE:    SUBJECTIVE STATEMENT: Pt reports being a somewhat sore after the last PT session, but is encouraged about getting stronger and getting around better.   PAIN:  Are you having pain? Yes: NPRS scale: 0/10 c pain medication this AM Pain location: L posterior knee Pain description: achy, stiffness Aggravating factors: bending her knee,  walking and standing too long Relieving factors: resting, meds,  propping it , rubbing it   PERTINENT HISTORY:  Tammy [name] is a 66 year old female who was in her usual state of good health when she fell in a store parking lot on 04/08/2021.  She tripped on a concrete block and needed help getting up from a bystander who called EMS.  She was brought to [facility] emergency department where imaging revealed comminuted and displaced fracture of distal left femur metaphysis.  Orthopedic surgery was  consulted and recommended transfer to [facility] due to lack of operating room availability at [facility] She had no other injuries. She was on no blood thinners. Dr. Marcelino Scot was consulted and the patient was taken to the OR on 3/2 and underwent ORIF of left distal femur with intercondylar extension.  Found to be hypokalemic on follow-up blood work yesterday and this was repleted.  Anemia noted with hemoglobin of 8.2.  Iron studies pending.   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 Jaynee Eagles.  History of left breast cancer 2022 status postlumpectomy, radiation therapy on anastrozole, history of liver cirrhosis due to to chronic hepatitis C virus and is followed by [facility]. History of hypertension and bipolar disorder.   PRECAUTIONS: None   WEIGHT BEARING RESTRICTIONS  WBAT   PATIENT GOALS I want to be able to walk independently   OBJECTIVE: (objective measures completed at initial evaluation unless otherwise dated):    DIAGNOSTIC FINDINGS: none    PATIENT SURVEYS:  FOTO NT    COGNITION:           Overall cognitive status: Within functional limits for tasks assessed                          SENSATION: WFL   EDEMA:  Circumferential: Rt   35 cm Lt.  36 cm    MUSCLE LENGTH: Hamstrings: tight bilateral  Thomas test: tight bilateral    POSTURE: weight shift right and genu valgus, pes planus    PALPATION: Tender to palpation with patellar mobs, posterior lateral   LOWER EXTREMITY ROM:   Passive ROM Right eval Left eval Lt 08/06/21   Hip flexion        Hip extension        Hip abduction        Hip adduction        Hip internal rotation   50    Hip external rotation   40 pain     Knee flexion 132 96 AAROM 100 AAROM 103 AAROM  Knee extension -8 -8 deg   -4 AAROM  Ankle dorsiflexion        Ankle plantarflexion        Ankle inversion        Ankle eversion          (Blank rows = not tested)   LOWER EXTREMITY MMT:   MMT Right eval Left eval  Hip flexion 4/5 4-/5  Hip extension   4/5  Hip abduction   3+/5  Hip adduction      Hip internal rotation      Hip external rotation      Knee flexion 5/5 4+/5  Knee extension 5/5 4/5  Ankle dorsiflexion 5/5 5/5  Ankle plantarflexion      Ankle inversion      Ankle eversion       (Blank rows = not tested)   FUNCTIONAL TESTS:  5 times sit to stand: 17 sec    GAIT: Distance walked: 210  Assistive device utilized: Environmental consultant - 2 wheeled Level of assistance: Modified independence Comments: 2 min walk test      TODAY'S TREATMENT: [facility] Adult PT Treatment:                                                DATE: 08/08/21 Therapeutic Exercise: Nustep 5 mins L4 UE/LE Seated knee flexion stretch x2 20" LAQ 2x10 3# STS s hands 2x10 AROM heel slides x5 10"; AAROM heel slides x5 20" x strap Supine hip clams 2x10 GTB Bridging x5, stopped at 5 reps with pt reporting knee pain  HEP updated  [facility] Adult PT Treatment:                                                DATE: 08/06/21 Therapeutic Exercise: Nustep 5 mins L4 UE/LE Quad set (wash clothe under knee) f/b SLR x15  Bridging attempted, but stopped due to report of knee pain Heel slide x10 f/b assisted heel slide knee flexion stretch x5 20 " Supine hip clams 2x10 GTB STS s hands 2x10 Updated HEP  Eval Treatment: PT eval and HEP, POC      PATIENT EDUCATION:  Education details: HEP, POC Person educated: Patient Education method: Explanation, Demonstration, and Handouts Education comprehension: verbalized understanding and needs further education     HOME EXERCISE PROGRAM: Access Code: [email or web address] URL: [email or web address] Date: 08/08/2021 Prepared by: Gar Ponto  Exercises - Supine Hamstring Stretch with Strap  - 2 x daily - 7 x weekly - 1 sets - 5 reps - 20 hold - Supine Heel Slide with Strap  - 2 x daily - 7 x weekly - 1 sets - 10  reps - 20 hold - Supine Single Knee to Chest Stretch  - 2 x daily - 7 x weekly - 1 sets - 5 reps - 30 hold - Supine Bridge  - 2 x daily - 7 x weekly - 2 sets - 10 reps - 5 hold - Sit to Stand Without Arm Support  - 2 x daily - 7 x weekly - 2 sets - 10 reps - 5 hold - Active Straight Leg Raise with Quad Set  - 1 x daily - 7 x weekly - 2 sets - 10 reps - 5 hold - Hooklying Clamshell with Resistance  - 1 x daily - 7 x weekly - 2 sets - 10 reps - 5 hold - Seated Long Arc Quad  - 1 x daily - 7 x weekly - 2 sets - 10 reps - 5 hold   ASSESSMENT:   CLINICAL IMPRESSION: PT participated in PT for L knee ROM and LE strengthening. Bridging continues to bother pt's L knee associated with increased strain while the knee is loaded in this position. Pt tolerates STS well. Pt's walking was min improved today c pt walking at a better pace. L knee ROM is making appropriate gains. Pt will continue to benefit from skilled PT to address impairments to improve function with less pain.  OBJECTIVE IMPAIRMENTS decreased activity tolerance, decreased mobility, difficulty walking, decreased ROM, decreased strength, hypomobility, increased edema, increased fascial restrictions, impaired flexibility, and postural dysfunction.  GOALS: Goals reviewed with patient? Yes   SHORT TERM GOALS: Target date: 08/08/2021  Pt will be able to walk in her home with cane without increased pain  Baseline: beginning to use cane vs RW Goal status: INITIAL   2.  Pt will be screened for balance and goal set if needed  Baseline: NT on eval  Goal status: INITIAL   3.  Pt will be able to show independence with HEP for mobility and strength Baseline:  Goal status: INITIAL     LONG TERM GOALS: Target date: 09/12/2021    Pt will be able to walk up and down stairs to enter her home with just 1 rail, reciprocal pattern.  Baseline: leads with Rt  Goal status: INITIAL   2.  Pt will be able to show independence with HEP for LLE  Baseline:  given on eval  Goal status: INITIAL   3.  Pt will demo 5/5 strength on LLE for maximal gait stability  Baseline: 4/5, see above  Goal status: INITIAL   4.  Pt will walk without the cane in the home with confidence and no increased pain .  Baseline:  Goal status: INITIAL   5.  Pt will bend L knee to 110 deg in sitting for more normal transfers Baseline:  Goal status: INITIAL     PLAN: PT FREQUENCY: 2x/week   PT DURATION: 8 weeks   PLANNED INTERVENTIONS: Therapeutic exercises, Therapeutic activity, Neuromuscular re-education, Balance training, Gait training, Patient/Family education, Joint mobilization, Stair training, DME instructions, Electrical stimulation, Cryotherapy, Moist heat, Taping, Vasopneumatic device, Manual therapy, and Re-evaluation   PLAN FOR NEXT SESSION: check HEP, gait, Nustep, manual for L knee mobility, balance activities

## 2021-08-08 ENCOUNTER — Ambulatory Visit: Payer: Medicare Other

## 2021-08-08 DIAGNOSIS — M25662 Stiffness of left knee, not elsewhere classified: Secondary | ICD-10-CM

## 2021-08-08 DIAGNOSIS — M6281 Muscle weakness (generalized): Secondary | ICD-10-CM

## 2021-08-08 DIAGNOSIS — Z483 Aftercare following surgery for neoplasm: Secondary | ICD-10-CM | POA: Diagnosis not present

## 2021-08-08 DIAGNOSIS — R262 Difficulty in walking, not elsewhere classified: Secondary | ICD-10-CM

## 2021-08-13 NOTE — Therapy (Unsigned)
OUTPATIENT PHYSICAL THERAPY TREATMENT NOTE   Patient Name: Tammy [name] MRN: [id number] DOB:December 12, 1955, 66 y.o., female Today's Date: 08/14/2021  PCP: [name], MD REFERRING PROVIDER: Ainsley Spinner PA-C  END OF SESSION:   PT End of Session - 08/14/21 0938     Visit Number 4    Number of Visits 16    Date for PT Re-Evaluation 09/12/21    Authorization Type UHC    PT Start Time 0932    PT Stop Time 1735    PT Time Calculation (min) 43 min    Activity Tolerance Patient tolerated treatment well    Behavior During Therapy [facility] for tasks assessed/performed               Past Medical History:  Diagnosis Date   Arthritis    Bipolar disorder ([place])    Breast cancer ([place])    Depression    Family history of breast cancer 07/10/2020   Family history of colon cancer 07/10/2020   Family history of ovarian cancer 07/10/2020   GERD (gastroesophageal reflux disease)    HTN (hypertension)    Stroke ([place]) 09/2012   slurred speech and left sided weakness/notes 10/04/2012   Past Surgical History:  Procedure Laterality Date   BREAST LUMPECTOMY WITH RADIOACTIVE SEED AND SENTINEL LYMPH NODE BIOPSY Left 07/26/2020   Procedure: LEFT BREAST LUMPECTOMY WITH RADIOACTIVE SEED AND SENTINEL LYMPH NODE BIOPSY;  Surgeon: [name], MD;  Location: [place];  Service: General;  Laterality: Left;   [place]   fracture of leg     2 months ago   ORIF FEMUR FRACTURE Left 04/10/2021   Procedure: OPEN REDUCTION INTERNAL FIXATION (ORIF) DISTAL FEMUR FRACTURE;  Surgeon: Altamese Tampico, MD;  Location: [place];  Service: Orthopedics;  Laterality: Left;   Patient Active Problem List   Diagnosis Date Noted   Hypokalemia 04/16/2021   Hip fracture requiring operative repair ([place]) 04/16/2021   Vitamin D deficiency 04/12/2021   Hyponatremia 04/11/2021   Acute postoperative anemia due to expected blood loss 04/11/2021   Class 1 obesity 04/11/2021   Left comminuted  and displaced fracture of the distal femur metaphysis 04/08/2021   Anemia 04/08/2021   History of CVA (cerebrovascular accident) 04/08/2021   Genetic testing 07/17/2020   Family history of breast cancer 07/10/2020   Family history of ovarian cancer 07/10/2020   Family history of colon cancer 07/10/2020   Malignant neoplasm of upper-outer quadrant of left breast in female, estrogen receptor positive ([place]) 07/09/2020   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   Chronic pain syndrome 10/06/2012   Nicotine dependence 10/06/2012   Anxiety state, unspecified 10/05/2012   Bipolar disorder, unspecified ([place]) 10/05/2012   Slurred speech 10/04/2012   Left-sided weakness 10/04/2012   Hyperkalemia 10/04/2012   Acute CVA (cerebrovascular accident) ([place]) 10/04/2012   HTN (hypertension)     REFERRING DIAG: Lt distal femur fracture  THERAPY DIAG:  Stiffness of left knee, not elsewhere classified  Muscle weakness (generalized)  Difficulty in walking, not elsewhere classified  Closed fracture of distal end of left femur, unspecified fracture morphology, sequela  Rationale for Evaluation and Treatment   ONSET DATE: 04/08/21   SUBJECTIVE:    SUBJECTIVE STATEMENT: I am going to the grocery store and getting what I want and need.  I think I am going to my reunion.  I wasn't going to but now I am! No pain right now.    PAIN:  Are you having pain? Yes: NPRS scale:  0/10 c pain medication this AM Pain location: L posterior knee Pain description: achy, stiffness Aggravating factors: bending her knee,  walking and standing too long Relieving factors: resting, meds, propping it , rubbing it   PERTINENT HISTORY:  Tammy [name] is a 66 year old female who was in her usual state of good health when she fell in a store parking lot on 04/08/2021.  She tripped on a concrete block and needed help getting up from a bystander who called EMS.  She was brought to [facility] emergency department where  imaging revealed comminuted and displaced fracture of distal left femur metaphysis.  Orthopedic surgery was consulted and recommended transfer to [facility] due to lack of operating room availability at [facility]. She had no other injuries. She was on no blood thinners. Dr. Marcelino [name] was consulted and the patient was taken to the OR on 3/2 and underwent ORIF of left distal femur with intercondylar extension.  Found to be hypokalemic on follow-up blood work yesterday and this was repleted.  Anemia noted with hemoglobin of 8.2.  Iron studies pending.   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 Jaynee [name].  History of left breast cancer 2022 status postlumpectomy, radiation therapy on anastrozole, history of liver cirrhosis due to to chronic hepatitis C virus and is followed by [facility]. History of hypertension and bipolar disorder.   PRECAUTIONS: None   WEIGHT BEARING RESTRICTIONS  WBAT   PATIENT GOALS I want to be able to walk independently   OBJECTIVE: (objective measures completed at initial evaluation unless otherwise dated):    DIAGNOSTIC FINDINGS: none    PATIENT SURVEYS:  FOTO NT    COGNITION:           Overall cognitive status: Within functional limits for tasks assessed                          SENSATION: WFL   EDEMA:  Circumferential: Rt   35 cm Lt.  36 cm    MUSCLE LENGTH: Hamstrings: tight bilateral  Thomas test: tight bilateral    POSTURE: weight shift right and genu valgus, pes planus    PALPATION: Tender to palpation with patellar mobs, posterior lateral   LOWER EXTREMITY ROM:   Passive ROM Right eval Left eval Lt 08/06/21   Hip flexion        Hip extension        Hip abduction        Hip adduction        Hip internal rotation   50    Hip external rotation   40 pain     Knee flexion 132 96 AAROM 100 AAROM 103 AAROM  Knee extension -8 -8 deg   -4 AAROM   Ankle dorsiflexion        Ankle plantarflexion        Ankle inversion        Ankle eversion         (Blank rows = not tested)   LOWER EXTREMITY MMT:   MMT Right eval Left eval  Hip flexion 4/5 4-/5  Hip extension   4/5  Hip abduction   3+/5  Hip adduction      Hip internal rotation      Hip external rotation      Knee flexion 5/5 4+/5  Knee extension 5/5 4/5  Ankle dorsiflexion 5/5 5/5  Ankle plantarflexion  Ankle inversion      Ankle eversion       (Blank rows = not tested)   FUNCTIONAL TESTS:  5 times sit to stand: 17 sec    GAIT: Distance walked: 210  Assistive device utilized: Environmental consultant - 2 wheeled Level of assistance: Modified independence Comments: 2 min walk test      TODAY'S TREATMENT:   [facility] Adult PT Treatment:                                                DATE: 08/14/21 Therapeutic Exercise: NuStep L6 UE and LE for 5 min  Sidelying hip abd, clam and small circles  x 15 each  Supine bridge x 10 , cramping LLE  Hamstring stretch 3 x 30 sec strap SLR x 10  AAROM flexion with strap x 5 , long hold time  Ball squeeze x 10  Manual Therapy:  Soft tissue to L quads, lateral hip, ITB,lateral hamstring to tolerance to reduce tension and promote mobility   [facility] Adult PT Treatment:                                                DATE: 08/08/21 Therapeutic Exercise: Nustep 5 mins L4 UE/LE Seated knee flexion stretch x2 20" LAQ 2x10 3# STS s hands 2x10 AROM heel slides x5 10"; AAROM heel slides x5 20" x strap Supine hip clams 2x10 GTB Bridging x5, stopped at 5 reps with pt reporting knee pain  HEP updated  [facility] Adult PT Treatment:                                                DATE: 08/06/21 Therapeutic Exercise: Nustep 5 mins L4 UE/LE Quad set (wash clothe under knee) f/b SLR x15  Bridging attempted, but stopped due to report of knee pain Heel slide x10 f/b assisted heel slide knee flexion stretch x5 20 " Supine hip clams 2x10 GTB STS s hands  2x10 Updated HEP  Eval Treatment: PT eval and HEP, POC      PATIENT EDUCATION:  Education details: HEP, POC Person educated: Patient Education method: Explanation, Demonstration, and Handouts Education comprehension: verbalized understanding and needs further education     HOME EXERCISE PROGRAM: Access Code: [email or web address] URL: [email or web address] Date: 08/08/2021 Prepared by: Gar Ponto  Exercises - Supine Hamstring Stretch with Strap  - 2 x daily - 7 x weekly - 1 sets - 5 reps - 20 hold - Supine Heel Slide with Strap  - 2 x daily - 7 x weekly - 1 sets - 10 reps - 20 hold - Supine Single Knee to Chest Stretch  - 2 x daily - 7 x weekly - 1 sets - 5 reps - 30 hold - Supine Bridge  - 2 x daily - 7 x weekly - 2 sets - 10 reps - 5 hold - Sit to Stand Without Arm Support  - 2 x daily - 7 x weekly - 2 sets - 10 reps - 5 hold - Active Straight Leg Raise with Quad Set  - 1 x daily - 7  x weekly - 2 sets - 10 reps - 5 hold - Hooklying Clamshell with Resistance  - 1 x daily - 7 x weekly - 2 sets - 10 reps - 5 hold - Seated Long Arc Quad  - 1 x daily - 7 x weekly - 2 sets - 10 reps - 5 hold   ASSESSMENT:   CLINICAL IMPRESSION:  Patient is very pleased with her current level of function, in that she is driving and doing her own shopping .  She cont to have weakness in hips, stiffness in L knee.  Able to flex L knee to 95 deg with pain today. Will cont to progress her ability to do standing exercises.    OBJECTIVE IMPAIRMENTS decreased activity tolerance, decreased mobility, difficulty walking, decreased ROM, decreased strength, hypomobility, increased edema, increased fascial restrictions, impaired flexibility, and postural dysfunction.     GOALS: Goals reviewed with patient? Yes   SHORT TERM GOALS: Target date: 08/08/2021  Pt will be able to walk in her home with cane without increased pain  Baseline: pain if I overdo it but barely Goal status: MET   2.  Pt will be screened  for balance and goal set if needed  Baseline: NT on eval  Goal status: INITIAL   3.  Pt will be able to show independence with HEP for mobility and strength Baseline:  Goal status: MET      LONG TERM GOALS: Target date: 09/12/2021    Pt will be able to walk up and down stairs to enter her home with just 1 rail, reciprocal pattern.  Baseline: leads with Rt  Goal status: in progress    2.  Pt will be able to show independence with HEP for LLE  Baseline: given on eval  Goal status: in progress    3.  Pt will demo 5/5 strength on LLE for maximal gait stability  Baseline: 4/5, see above  Goal status: in progress    4.  Pt will walk without the cane in the home with confidence and no increased pain .  Baseline:  Goal status: in progress   5.  Pt will bend L knee to 110 deg in sitting for more normal transfers Baseline:  Goal status: in progress      PLAN: PT FREQUENCY: 2x/week   PT DURATION: 8 weeks   PLANNED INTERVENTIONS: Therapeutic exercises, Therapeutic activity, Neuromuscular re-education, Balance training, Gait training, Patient/Family education, Joint mobilization, Stair training, DME instructions, Electrical stimulation, Cryotherapy, Moist heat, Taping, Vasopneumatic device, Manual therapy, and Re-evaluation   PLAN FOR NEXT SESSION: check HEP, gait, Nustep, manual for L knee mobility, balance activities     Raeford Razor, PT 08/14/21 1:38 PM Phone: [phone] Fax: [phone]

## 2021-08-14 ENCOUNTER — Ambulatory Visit: Payer: Medicare Other | Attending: Adult Health | Admitting: Physical Therapy

## 2021-08-14 ENCOUNTER — Encounter: Payer: Self-pay | Admitting: Physical Therapy

## 2021-08-14 DIAGNOSIS — G8929 Other chronic pain: Secondary | ICD-10-CM | POA: Diagnosis present

## 2021-08-14 DIAGNOSIS — M6281 Muscle weakness (generalized): Secondary | ICD-10-CM | POA: Diagnosis present

## 2021-08-14 DIAGNOSIS — R262 Difficulty in walking, not elsewhere classified: Secondary | ICD-10-CM | POA: Insufficient documentation

## 2021-08-14 DIAGNOSIS — R293 Abnormal posture: Secondary | ICD-10-CM | POA: Diagnosis present

## 2021-08-14 DIAGNOSIS — S72402S Unspecified fracture of lower end of left femur, sequela: Secondary | ICD-10-CM | POA: Diagnosis present

## 2021-08-14 DIAGNOSIS — M25662 Stiffness of left knee, not elsewhere classified: Secondary | ICD-10-CM | POA: Diagnosis present

## 2021-08-14 DIAGNOSIS — M25562 Pain in left knee: Secondary | ICD-10-CM | POA: Insufficient documentation

## 2021-08-14 DIAGNOSIS — R2689 Other abnormalities of gait and mobility: Secondary | ICD-10-CM | POA: Diagnosis present

## 2021-08-19 ENCOUNTER — Ambulatory Visit: Payer: Medicare Other

## 2021-08-21 ENCOUNTER — Ambulatory Visit: Payer: Medicare Other | Admitting: Physical Therapy

## 2021-08-21 ENCOUNTER — Encounter: Payer: Self-pay | Admitting: Physical Therapy

## 2021-08-21 DIAGNOSIS — M25662 Stiffness of left knee, not elsewhere classified: Secondary | ICD-10-CM | POA: Diagnosis not present

## 2021-08-21 DIAGNOSIS — R262 Difficulty in walking, not elsewhere classified: Secondary | ICD-10-CM

## 2021-08-21 DIAGNOSIS — M6281 Muscle weakness (generalized): Secondary | ICD-10-CM

## 2021-08-21 DIAGNOSIS — R293 Abnormal posture: Secondary | ICD-10-CM

## 2021-08-21 DIAGNOSIS — S72402S Unspecified fracture of lower end of left femur, sequela: Secondary | ICD-10-CM

## 2021-08-21 NOTE — Therapy (Signed)
OUTPATIENT PHYSICAL THERAPY TREATMENT NOTE   Patient Name: Tammy [name] MRN: [id number] DOB:04-Jul-1955, 66 y.o., female Today's Date: 08/21/2021  PCP: [name], MD REFERRING PROVIDER: Ainsley Spinner PA-C  END OF SESSION:   PT End of Session - 08/21/21 0940     Visit Number 5    Number of Visits 16    Date for PT Re-Evaluation 09/12/21    Authorization Type [employer]    PT Start Time 0933    PT Stop Time 1016    PT Time Calculation (min) 43 min    Activity Tolerance Patient tolerated treatment well    Behavior During Therapy [facility] for tasks assessed/performed               Past Medical History:  Diagnosis Date   Arthritis    Bipolar disorder ([place])    Breast cancer ([place])    Depression    Family history of breast cancer 07/10/2020   Family history of colon cancer 07/10/2020   Family history of ovarian cancer 07/10/2020   GERD (gastroesophageal reflux disease)    HTN (hypertension)    Stroke ([place]) 09/2012   slurred speech and left sided weakness/notes 10/04/2012   Past Surgical History:  Procedure Laterality Date   BREAST LUMPECTOMY WITH RADIOACTIVE SEED AND SENTINEL LYMPH NODE BIOPSY Left 07/26/2020   Procedure: LEFT BREAST LUMPECTOMY WITH RADIOACTIVE SEED AND SENTINEL LYMPH NODE BIOPSY;  Surgeon: [name], MD;  Location: [facility];  Service: General;  Laterality: Left;   [place]   fracture of leg     2 months ago   ORIF FEMUR FRACTURE Left 04/10/2021   Procedure: OPEN REDUCTION INTERNAL FIXATION (ORIF) DISTAL FEMUR FRACTURE;  Surgeon: Altamese Charles Mix, MD;  Location: [facility];  Service: Orthopedics;  Laterality: Left;   Patient Active Problem List   Diagnosis Date Noted   Hypokalemia 04/16/2021   Hip fracture requiring operative repair ([place]) 04/16/2021   Vitamin D deficiency 04/12/2021   Hyponatremia 04/11/2021   Acute postoperative anemia due to expected blood loss 04/11/2021   Class 1 obesity 04/11/2021   Left  comminuted and displaced fracture of the distal femur metaphysis 04/08/2021   Anemia 04/08/2021   History of CVA (cerebrovascular accident) 04/08/2021   Genetic testing 07/17/2020   Family history of breast cancer 07/10/2020   Family history of ovarian cancer 07/10/2020   Family history of colon cancer 07/10/2020   Malignant neoplasm of upper-outer quadrant of left breast in female, estrogen receptor positive ([place]) 07/09/2020   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   Chronic pain syndrome 10/06/2012   Nicotine dependence 10/06/2012   Anxiety state, unspecified 10/05/2012   Bipolar disorder, unspecified ([place]) 10/05/2012   Slurred speech 10/04/2012   Left-sided weakness 10/04/2012   Hyperkalemia 10/04/2012   Acute CVA (cerebrovascular accident) ([place]) 10/04/2012   HTN (hypertension)     REFERRING DIAG: Lt distal femur fracture  THERAPY DIAG:  Stiffness of left knee, not elsewhere classified  Muscle weakness (generalized)  Difficulty in walking, not elsewhere classified  Closed fracture of distal end of left femur, unspecified fracture morphology, sequela  Abnormal posture  Rationale for Evaluation and Treatment   ONSET DATE: 04/08/21   SUBJECTIVE:    SUBJECTIVE STATEMENT: I am in no pain .  I feel so grateful  I need to work on this bending this knee.    PAIN:  Are you having pain? Yes: NPRS scale: 0/10 c pain medication this AM Pain location: L posterior knee Pain  description: achy, stiffness Aggravating factors: bending her knee,  walking and standing too long Relieving factors: resting, meds, propping it , rubbing it   PERTINENT HISTORY:  Tammy [name] is a 67 year old female who was in her usual state of good health when she fell in a store parking lot on 04/08/2021.  She tripped on a concrete block and needed help getting up from a bystander who called EMS.  She was brought to [facility] emergency department where imaging revealed comminuted and displaced  fracture of distal left femur metaphysis.  Orthopedic surgery was consulted and recommended transfer to [facility] due to lack of operating room availability at [facility]. She had no other injuries. She was on no blood thinners. Dr. Marcelino Scot was consulted and the patient was taken to the OR on 3/2 and underwent ORIF of left distal femur with intercondylar extension.  Found to be hypokalemic on follow-up blood work yesterday and this was repleted.  Anemia noted with hemoglobin of 8.2.  Iron studies pending.   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 Jaynee Eagles.  History of left breast cancer 2022 status postlumpectomy, radiation therapy on anastrozole, history of liver cirrhosis due to to chronic hepatitis C virus and is followed by [facility]. History of hypertension and bipolar disorder.   PRECAUTIONS: None   WEIGHT BEARING RESTRICTIONS  WBAT   PATIENT GOALS I want to be able to walk independently   OBJECTIVE: (objective measures completed at initial evaluation unless otherwise dated):    DIAGNOSTIC FINDINGS: none    PATIENT SURVEYS:  FOTO NT    COGNITION:           Overall cognitive status: Within functional limits for tasks assessed                          SENSATION: WFL   EDEMA:  Circumferential: Rt   35 cm Lt.  36 cm    MUSCLE LENGTH: Hamstrings: tight bilateral  Thomas test: tight bilateral    POSTURE: weight shift right and genu valgus, pes planus    PALPATION: Tender to palpation with patellar mobs, posterior lateral   LOWER EXTREMITY ROM:   Passive ROM Right eval Left eval Lt 08/06/21 Lt  Hip flexion        Hip extension        Hip abduction        Hip adduction        Hip internal rotation   50    Hip external rotation   40 pain     Knee flexion 132 96 AAROM 100 AAROM 103 AAROM  Knee extension -8 -8 deg   -4 AAROM  Ankle dorsiflexion        Ankle  plantarflexion        Ankle inversion        Ankle eversion         (Blank rows = not tested)   LOWER EXTREMITY MMT:   MMT Right eval Left eval Lt.  08/22/21  Hip flexion 4/5 4-/5   Hip extension   4/5   Hip abduction   3+/5   Hip adduction       Hip internal rotation       Hip external rotation       Knee flexion 5/5 4+/5   Knee extension 5/5 4/5   Ankle dorsiflexion 5/5 5/5   Ankle plantarflexion  Ankle inversion       Ankle eversion        (Blank rows = not tested)   FUNCTIONAL TESTS:  5 times sit to stand: 17 sec    GAIT: Distance walked: 210  Assistive device utilized: Walker - 2 wheeled Level of assistance: Modified independence Comments: 2 min walk test      TODAY'S TREATMENT:  [facility] Adult PT Treatment:                                                DATE: 08/21/21 Therapeutic Exercise: Nustep L6 UE and LE for 6 min  LAQ 6 lbs x 20  Theraband HS curl (blue ) x 15, 5 sec hold  Hip flexion 6 lbs x 10  Standing hip extension forearms on high mat table x 10 bilateral  Hip abduction x 10  Physical Performance Testing BERG BALANCE  Sitting to Standing: Numbers; 0-4: 4  4. Stands without using hands and stabilize independently  3. Stands independently using hands  2. Stands using hands after multiple trials  1. Min A to stand  0. Mod-Max A to stand Standing unsupported: Numbers; 0-4: 4  4. Stands safely for 2 minutes  3. Stands 2 minutes with supervision  2. Stands 30 seconds unsupported  1. Needs several tries to stand unsupported for 30 seconds  0. Unable to stand unsupported for 30 seconds Sitting unsupported: Numbers; 0-4: 4  4. Sits for 2 minutes independently  3. Sits for 2 minutes with supervision  2. Able to sit 30 seconds  1. Able to sit 10 seconds  0. Unable to sit for 10 seconds Standing to Sitting: Numbers; 0-4: 4 4. Sits safely with minimal use of hands 3. Controls descent with hands 2. Uses back of legs against chair to control  descent 1. Sits independently, but uncontrolled descent 0. Needs assistance Transfers: Numbers; 0-4: 4  4. Transfers safely with minor use of hands  3. Transfers safely definite use of hands  2. Transfers with verbal cueing/supervision  1. Needs 1 person assist  0. Needs 2 person assist  Standing with eyes closed: Numbers; 0-4: 4  4. Stands safely for 10 seconds  3. Stands 10 seconds with supervision   2. Able to stand for 3 seconds  1. Unable to keep eyes closed for 3 seconds, but is safe  0. Needs assist to keep from falling Standing with feet together: Numbers; 0-4: 4 4. Stands for 1 minute safely 3. Stands for 1 minute with supervision 2. Unable to hold for 30 seconds  1. Needs help to attain position but can hold for 15 seconds  0. Needs help to attain position and unable to hold for 15 seconds Reaching forward with outstretched arm: Numbers; 0-4: 4  4. Reaches forward 10 inches  3. Reaches forward 5 inches  2. Reaches forward 2 inches  1. Reaches forward with supervision  0. Loses balance/requires assistace Retrieving object from the floor: Numbers; 0-4: 4 4. Able to pick up easily and safely 3. Able to pick up with supervision 2. Unable to pick up, but reaches within 1-2 inches independently 1. Unable to pick up and needs supervision 0. Unable/needs assistance to keep from falling  Turning to look behind: Numbers; 0-4: 4  4. Looks behind from both sides and weight shifts well  3. Looks behind one   side only, other side less weight shift  2. Turns sideways only, maintains balance  1. Needs supervision when turning  0. Needs assistance  Turning 360 degrees: Numbers; 0-4: 2  4. Able to turn in </=4 seconds  3. Able to turn on one side in </= 4 seconds   2. Able to turn slowly, but safely  1. Needs supervision or verbal cueing  0. Needs assistance Place alternate foot on stool: Numbers; 0-4: 1 4. Completes 8 steps in 20 seconds 3. Completes 8 steps in >20 seconds 2.  4 steps without assistance/supervision 1. Completes >2 steps with minimal assist 0. Unable, needs assist to keep from falling Standing with one foot in front: Numbers; 0-4: 2  4. Independent tandem for 30 seconds  3. Independent foot ahead for 30 seconds  2. Independent small step for 30 seconds  1. Needs help to step, but can hold for 15 seconds  0. Loses balance while standing/stepping Standing on one foot: Numbers; 0-4: 2 CHOSE RT LE  4. Holds >10 seconds 3. Holds 5-10 seconds 2. Holds >/=3 seconds  1. Holds <3 seconds 0. Unable   Total Score: 47/56 , mod fall risk without cane   Manual Therapy: PROM L knee , belt mobilization seated EOB   [facility] Adult PT Treatment:                                                DATE: 08/14/21 Therapeutic Exercise: NuStep L6 UE and LE for 5 min  Sidelying hip abd, clam and small circles  x 15 each  Supine bridge x 10 , cramping LLE  Hamstring stretch 3 x 30 sec strap SLR x 10  AAROM flexion with strap x 5 , long hold time  Ball squeeze x 10  Manual Therapy:  Soft tissue to L quads, lateral hip, ITB,lateral hamstring to tolerance to reduce tension and promote mobility   [facility] Adult PT Treatment:                                                DATE: 08/08/21 Therapeutic Exercise: Nustep 5 mins L4 UE/LE Seated knee flexion stretch x2 20" LAQ 2x10 3# STS s hands 2x10 AROM heel slides x5 10"; AAROM heel slides x5 20" x strap Supine hip clams 2x10 GTB Bridging x5, stopped at 5 reps with pt reporting knee pain  HEP updated  [facility] Adult PT Treatment:                                                DATE: 08/06/21 Therapeutic Exercise: Nustep 5 mins L4 UE/LE Quad set (wash clothe under knee) f/b SLR x15  Bridging attempted, but stopped due to report of knee pain Heel slide x10 f/b assisted heel slide knee flexion stretch x5 20 " Supine hip clams 2x10 GTB STS s hands 2x10 Updated HEP  Eval Treatment: PT eval and HEP, POC      PATIENT  EDUCATION:  Education details: HEP, POC Person educated: Patient Education method: Explanation, Demonstration, and Handouts Education comprehension: verbalized understanding and needs further education       HOME EXERCISE PROGRAM: Access Code: [email or web address] URL: [email or web address] Date: 08/08/2021 Prepared by: Allen Ralls  Exercises - Supine Hamstring Stretch with Strap  - 2 x daily - 7 x weekly - 1 sets - 5 reps - 20 hold - Supine Heel Slide with Strap  - 2 x daily - 7 x weekly - 1 sets - 10 reps - 20 hold - Supine Single Knee to Chest Stretch  - 2 x daily - 7 x weekly - 1 sets - 5 reps - 30 hold - Supine Bridge  - 2 x daily - 7 x weekly - 2 sets - 10 reps - 5 hold - Sit to Stand Without Arm Support  - 2 x daily - 7 x weekly - 2 sets - 10 reps - 5 hold - Active Straight Leg Raise with Quad Set  - 1 x daily - 7 x weekly - 2 sets - 10 reps - 5 hold - Hooklying Clamshell with Resistance  - 1 x daily - 7 x weekly - 2 sets - 10 reps - 5 hold - Seated Long Arc Quad  - 1 x daily - 7 x weekly - 2 sets - 10 reps - 5 hold   ASSESSMENT:   CLINICAL IMPRESSION:  Patient continues to do well.  She has very little to no pain most of the time. Her Berg balance score was 47/56 with significant difficulty with SLS on LLE and tandem.  She has weakness in hips, core and she reminded me she has had a stroke which did affect her L side to a degree.    Edema still present in knee and lower leg.  Cont POC. And will make more visits until end of plan.   OBJECTIVE IMPAIRMENTS decreased activity tolerance, decreased mobility, difficulty walking, decreased ROM, decreased strength, hypomobility, increased edema, increased fascial restrictions, impaired flexibility, and postural dysfunction.     GOALS: Goals reviewed with patient? Yes   SHORT TERM GOALS: Target date: 08/08/2021  Pt will be able to walk in her home with cane without increased pain  Baseline: pain if I overdo it but barely Goal status:  MET   2.  Pt will be screened for balance and goal set if needed  Baseline: NT on eval  Goal status: INITIAL   3.  Pt will be able to show independence with HEP for mobility and strength Baseline:  Goal status: MET      LONG TERM GOALS: Target date: 09/12/2021    Pt will be able to walk up and down stairs to enter her home with just 1 rail, reciprocal pattern.  Baseline: leads with Rt  Goal status: in progress    2.  Pt will be able to show independence with HEP for LLE  Baseline: given on eval  Goal status: in progress    3.  Pt will demo 5/5 strength on LLE for maximal gait stability  Baseline: 4/5, see above  Goal status: in progress    4.  Pt will walk without the cane in the home with confidence and no increased pain .  Baseline:  Goal status: in progress   5.  Pt will bend L knee to 110 deg in sitting for more normal transfers Baseline:  Goal status: in progress      PLAN: PT FREQUENCY: 2x/week   PT DURATION: 8 weeks   PLANNED INTERVENTIONS: Therapeutic exercises, Therapeutic activity, Neuromuscular re-education, Balance training, Gait training, Patient/Family education, Joint mobilization, Stair training, DME instructions, Electrical stimulation,   Cryotherapy, Moist heat, Taping, Vasopneumatic device, Manual therapy, and Re-evaluation   PLAN FOR NEXT SESSION: MMT check HEP, gait, Nustep, manual for L knee mobility, balance activities     Raeford Razor, PT 08/21/21 11:50 AM Phone: [phone] Fax: [phone]

## 2021-08-22 ENCOUNTER — Ambulatory Visit: Payer: Medicare Other

## 2021-08-22 DIAGNOSIS — R2689 Other abnormalities of gait and mobility: Secondary | ICD-10-CM

## 2021-08-22 DIAGNOSIS — G8929 Other chronic pain: Secondary | ICD-10-CM

## 2021-08-22 DIAGNOSIS — M6281 Muscle weakness (generalized): Secondary | ICD-10-CM

## 2021-08-22 DIAGNOSIS — M25662 Stiffness of left knee, not elsewhere classified: Secondary | ICD-10-CM | POA: Diagnosis not present

## 2021-08-22 DIAGNOSIS — R262 Difficulty in walking, not elsewhere classified: Secondary | ICD-10-CM

## 2021-08-22 NOTE — Therapy (Addendum)
OUTPATIENT PHYSICAL THERAPY TREATMENT NOTE/Discharge   Patient Name: Tammy [name] MRN: [id number] DOB:05/21/1955, 66 y.o., female Today's Date: 08/23/2021  PCP: [name], MD REFERRING PROVIDER: Ainsley Spinner PA-C  END OF SESSION:   PT End of Session - 08/22/21 0812     Visit Number 6    Number of Visits 16    Date for PT Re-Evaluation 09/12/21    Authorization Type [employer]    PT Start Time 0803    PT Stop Time 0845    PT Time Calculation (min) 42 min    Activity Tolerance Patient tolerated treatment well    Behavior During Therapy [facility] for tasks assessed/performed                Past Medical History:  Diagnosis Date   Arthritis    Bipolar disorder ([place])    Breast cancer ([place])    Depression    Family history of breast cancer 07/10/2020   Family history of colon cancer 07/10/2020   Family history of ovarian cancer 07/10/2020   GERD (gastroesophageal reflux disease)    HTN (hypertension)    Stroke ([place]) 09/2012   slurred speech and left sided weakness/notes 10/04/2012   Past Surgical History:  Procedure Laterality Date   BREAST LUMPECTOMY WITH RADIOACTIVE SEED AND SENTINEL LYMPH NODE BIOPSY Left 07/26/2020   Procedure: LEFT BREAST LUMPECTOMY WITH RADIOACTIVE SEED AND SENTINEL LYMPH NODE BIOPSY;  Surgeon: [name], MD;  Location: [place];  Service: General;  Laterality: Left;   [place]   fracture of leg     2 months ago   ORIF FEMUR FRACTURE Left 04/10/2021   Procedure: OPEN REDUCTION INTERNAL FIXATION (ORIF) DISTAL FEMUR FRACTURE;  Surgeon: Altamese [place], MD;  Location: [place];  Service: Orthopedics;  Laterality: Left;   Patient Active Problem List   Diagnosis Date Noted   Hypokalemia 04/16/2021   Hip fracture requiring operative repair ([place]) 04/16/2021   Vitamin D deficiency 04/12/2021   Hyponatremia 04/11/2021   Acute postoperative anemia due to expected blood loss 04/11/2021   Class 1 obesity 04/11/2021    Left comminuted and displaced fracture of the distal femur metaphysis 04/08/2021   Anemia 04/08/2021   History of CVA (cerebrovascular accident) 04/08/2021   Genetic testing 07/17/2020   Family history of breast cancer 07/10/2020   Family history of ovarian cancer 07/10/2020   Family history of colon cancer 07/10/2020   Malignant neoplasm of upper-outer quadrant of left breast in female, estrogen receptor positive ([place]) 07/09/2020   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   Chronic pain syndrome 10/06/2012   Nicotine dependence 10/06/2012   Anxiety state, unspecified 10/05/2012   Bipolar disorder, unspecified ([place]) 10/05/2012   Slurred speech 10/04/2012   Left-sided weakness 10/04/2012   Hyperkalemia 10/04/2012   Acute CVA (cerebrovascular accident) ([place]) 10/04/2012   HTN (hypertension)     REFERRING DIAG: Lt distal femur fracture  THERAPY DIAG:  Chronic pain of left knee  Difficulty in walking, not elsewhere classified  Muscle weakness (generalized)  Other abnormalities of gait and mobility  Rationale for Evaluation and Treatment   ONSET DATE: 04/08/21   SUBJECTIVE:    SUBJECTIVE STATEMENT: Pt reports she is having a good day. No pain currently. Sometimes I'll have pain at night.    PAIN:  Are you having pain? Yes: NPRS scale: 0/10 c pain medication this AM Pain location: L posterior knee Pain description: achy, stiffness Aggravating factors: bending her knee,  walking and standing too long  Relieving factors: resting, meds, propping it , rubbing it   PERTINENT HISTORY:  Tammy [name] is a 66 year old female who was in her usual state of good health when she fell in a store parking lot on 04/08/2021.  She tripped on a concrete block and needed help getting up from a bystander who called EMS.  She was brought to [facility] emergency department where imaging revealed comminuted and displaced fracture of distal left femur metaphysis.  Orthopedic surgery was  consulted and recommended transfer to [facility] due to lack of operating room availability at [facility]. She had no other injuries. She was on no blood thinners. Dr. Marcelino Scot was consulted and the patient was taken to the OR on 3/2 and underwent ORIF of left distal femur with intercondylar extension.  Found to be hypokalemic on follow-up blood work yesterday and this was repleted.  Anemia noted with hemoglobin of 8.2.  Iron studies pending.   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 Jaynee Eagles.  History of left breast cancer 2022 status postlumpectomy, radiation therapy on anastrozole, history of liver cirrhosis due to to chronic hepatitis C virus and is followed by [place] [facility]. History of hypertension and bipolar disorder.   PRECAUTIONS: None   WEIGHT BEARING RESTRICTIONS  WBAT   PATIENT GOALS I want to be able to walk independently   OBJECTIVE: (objective measures completed at initial evaluation unless otherwise dated):    DIAGNOSTIC FINDINGS: none    PATIENT SURVEYS:  FOTO NT    COGNITION:           Overall cognitive status: Within functional limits for tasks assessed                          SENSATION: WFL   EDEMA:  Circumferential: Rt   35 cm Lt.  36 cm    MUSCLE LENGTH: Hamstrings: tight bilateral  Thomas test: tight bilateral    POSTURE: weight shift right and genu valgus, pes planus    PALPATION: Tender to palpation with patellar mobs, posterior lateral   LOWER EXTREMITY ROM:   Passive ROM Right eval Left eval Lt 08/06/21 Lt  Hip flexion        Hip extension        Hip abduction        Hip adduction        Hip internal rotation   50    Hip external rotation   40 pain     Knee flexion 132 96 AAROM 100 AAROM 103 AAROM  Knee extension -8 -8 deg   -4 AAROM  Ankle dorsiflexion        Ankle plantarflexion        Ankle inversion        Ankle eversion          (Blank rows = not tested)   LOWER EXTREMITY MMT:   MMT Right eval Left eval   Hip flexion 4/5 4-/5   Hip extension   4/5   Hip abduction   3+/5   Hip adduction       Hip internal rotation       Hip external rotation       Knee flexion 5/5 4+/5   Knee extension 5/5 4/5   Ankle dorsiflexion 5/5 5/5   Ankle plantarflexion       Ankle inversion       Ankle eversion        (  Blank rows = not tested)   FUNCTIONAL TESTS:  5 times sit to stand: 17 sec    GAIT: Distance walked: 210  Assistive device utilized: Environmental consultant - 2 wheeled Level of assistance: Modified independence Comments: 2 min walk test      TODAY'S TREATMENT: [facility] Adult PT Treatment:                                                DATE: 08/22/21 Therapeutic Exercise: Nustep L5 UE and LE for 10 min c progressive knee flexion SLR c quad set 2x10 2# SL L hip abd 2x10 2# SL L hip ER 2x10 GTB LAQ 2x10 5# STS 2x10, pt was light headed with 1st stand after being supine and was assisted for balance. Balance on airex, narrow base, and partial tandem Lateral step ups L LE to airex, hand A as needed  [facility] Adult PT Treatment:                                                DATE: 08/21/21 Therapeutic Exercise: Nustep L6 UE and LE for 6 min  LAQ 6 lbs x 20  Theraband HS curl (blue ) x 15, 5 sec hold  Hip flexion 6 lbs x 10  Standing hip extension forearms on high mat table x 10 bilateral  Hip abduction x 10  Physical Performance Testing BERG BALANCE  Sitting to Standing: Numbers; 0-4: 4  4. Stands without using hands and stabilize independently  3. Stands independently using hands  2. Stands using hands after multiple trials  1. Min A to stand  0. Mod-Max A to stand Standing unsupported: Numbers; 0-4: 4  4. Stands safely for 2 minutes  3. Stands 2 minutes with supervision  2. Stands 30 seconds unsupported  1. Needs several tries to stand unsupported for 30 seconds  0. Unable to stand unsupported for 30 seconds Sitting  unsupported: Numbers; 0-4: 4  4. Sits for 2 minutes independently  3. Sits for 2 minutes with supervision  2. Able to sit 30 seconds  1. Able to sit 10 seconds  0. Unable to sit for 10 seconds Standing to Sitting: Numbers; 0-4: 4 4. Sits safely with minimal use of hands 3. Controls descent with hands 2. Uses back of legs against chair to control descent 1. Sits independently, but uncontrolled descent 0. Needs assistance Transfers: Numbers; 0-4: 4  4. Transfers safely with minor use of hands  3. Transfers safely definite use of hands  2. Transfers with verbal cueing/supervision  1. Needs 1 person assist  0. Needs 2 person assist  Standing with eyes closed: Numbers; 0-4: 4  4. Stands safely for 10 seconds  3. Stands 10 seconds with supervision   2. Able to stand for 3 seconds  1. Unable to keep eyes closed for 3 seconds, but is safe  0. Needs assist to keep from falling Standing with feet together: Numbers; 0-4: 4 4. Stands for 1 minute safely 3. Stands for 1 minute with supervision 2. Unable to hold for 30 seconds  1. Needs help to attain position but can hold for 15 seconds  0. Needs help to attain position and unable to hold for 15 seconds Reaching forward with  outstretched arm: Numbers; 0-4: 4  4. Reaches forward 10 inches  3. Reaches forward 5 inches  2. Reaches forward 2 inches  1. Reaches forward with supervision  0. Loses balance/requires assistace Retrieving object from the floor: Numbers; 0-4: 4 4. Able to pick up easily and safely 3. Able to pick up with supervision 2. Unable to pick up, but reaches within 1-2 inches independently 1. Unable to pick up and needs supervision 0. Unable/needs assistance to keep from falling  Turning to look behind: Numbers; 0-4: 4  4. Looks behind from both sides and weight shifts well  3. Looks behind one side only, other side less weight shift  2. Turns sideways only, maintains balance  1. Needs supervision when turning  0. Needs  assistance  Turning 360 degrees: Numbers; 0-4: 2  4. Able to turn in </=4 seconds  3. Able to turn on one side in </= 4 seconds   2. Able to turn slowly, but safely  1. Needs supervision or verbal cueing  0. Needs assistance Place alternate foot on stool: Numbers; 0-4: 1 4. Completes 8 steps in 20 seconds 3. Completes 8 steps in >20 seconds 2. 4 steps without assistance/supervision 1. Completes >2 steps with minimal assist 0. Unable, needs assist to keep from falling Standing with one foot in front: Numbers; 0-4: 2  4. Independent tandem for 30 seconds  3. Independent foot ahead for 30 seconds  2. Independent small step for 30 seconds  1. Needs help to step, but can hold for 15 seconds  0. Loses balance while standing/stepping Standing on one foot: Numbers; 0-4: 2 CHOSE RT LE  4. Holds >10 seconds 3. Holds 5-10 seconds 2. Holds >/=3 seconds  1. Holds <3 seconds 0. Unable   Total Score: 47/56 , mod fall risk without cane   Manual Therapy: PROM L knee , belt mobilization seated EOB   [facility] Adult PT Treatment:                                                DATE: 08/14/21 Therapeutic Exercise: NuStep L6 UE and LE for 5 min  Sidelying hip abd, clam and small circles  x 15 each  Supine bridge x 10 , cramping LLE  Hamstring stretch 3 x 30 sec strap SLR x 10  AAROM flexion with strap x 5 , long hold time  Ball squeeze x 10  Manual Therapy:  Soft tissue to L quads, lateral hip, ITB,lateral hamstring to tolerance to reduce tension and promote mobility      PATIENT EDUCATION:  Education details: HEP, POC Person educated: Patient Education method: Explanation, Demonstration, and Handouts Education comprehension: verbalized understanding and needs further education     HOME EXERCISE PROGRAM: Access Code: [email or web address] URL: [email or web address] Date: 08/08/2021 Prepared by: Gar Ponto  Exercises - Supine Hamstring Stretch with Strap  - 2 x daily - 7 x weekly - 1  sets - 5 reps - 20 hold - Supine Heel Slide with Strap  - 2 x daily - 7 x weekly - 1 sets - 10 reps - 20 hold - Supine Single Knee to Chest Stretch  - 2 x daily - 7 x weekly - 1 sets - 5 reps - 30 hold - Supine Bridge  - 2 x daily - 7 x weekly - 2 sets - 10 reps - 5  hold - Sit to Stand Without Arm Support  - 2 x daily - 7 x weekly - 2 sets - 10 reps - 5 hold - Active Straight Leg Raise with Quad Set  - 1 x daily - 7 x weekly - 2 sets - 10 reps - 5 hold - Hooklying Clamshell with Resistance  - 1 x daily - 7 x weekly - 2 sets - 10 reps - 5 hold - Seated Long Arc Quad  - 1 x daily - 7 x weekly - 2 sets - 10 reps - 5 hold   ASSESSMENT:   CLINICAL IMPRESSION: Pt completed PT for L LE ROM, strengthening and balance. Pt's strength and function are better. Use of a SPC is needed for assist for safety. Pt experienced light headiness with her initial stand following being supine. Pt advised not to start walking until light headedness resolves or to sit back down is not decreasing. Pt will continue to benefit from skilled PT to address impairments to optimize functional mobility and safety.  OBJECTIVE IMPAIRMENTS decreased activity tolerance, decreased mobility, difficulty walking, decreased ROM, decreased strength, hypomobility, increased edema, increased fascial restrictions, impaired flexibility, and postural dysfunction.     GOALS: Goals reviewed with patient? Yes   SHORT TERM GOALS: Target date: 08/08/2021  Pt will be able to walk in her home with cane without increased pain  Baseline: pain if I overdo it but barely Goal status: MET   2.  Pt will be screened for balance and goal set if needed  Baseline: NT on eval  Goal status: MET   3.  Pt will be able to show independence with HEP for mobility and strength Baseline:  Goal status: MET      LONG TERM GOALS: Target date: 09/12/2021    Pt will be able to walk up and down stairs to enter her home with just 1 rail, reciprocal pattern.   Baseline: leads with Rt  Goal status: in progress    2.  Pt will be able to show independence with HEP for LLE  Baseline: given on eval  Goal status: in progress    3.  Pt will demo 5/5 strength on LLE for maximal gait stability  Baseline: 4/5, see above  Goal status: in progress    4.  Pt will walk without the cane in the home with confidence and no increased pain .  Baseline:  Goal status: in progress   5.  Pt will bend L knee to 110 deg in sitting for more normal transfers Baseline:  Goal status: in progress      PLAN: PT FREQUENCY: 2x/week   PT DURATION: 8 weeks   PLANNED INTERVENTIONS: Therapeutic exercises, Therapeutic activity, Neuromuscular re-education, Balance training, Gait training, Patient/Family education, Joint mobilization, Stair training, DME instructions, Electrical stimulation, Cryotherapy, Moist heat, Taping, Vasopneumatic device, Manual therapy, and Re-evaluation   PLAN FOR NEXT SESSION: MMT check HEP, gait, Nustep, manual for L knee mobility, balance activities     [employer] MS, PT 08/23/21 11:49 AM  PHYSICAL THERAPY DISCHARGE SUMMARY  Visits from Start of Care: 6  Current functional level related to goals / functional outcomes: See clinical impression and PT goals    Remaining deficits: See clinical impression and PT goals    Education / Equipment: HEP   Patient agrees to discharge. Patient goals were met. Patient is being discharged due to not returning since the last visit.  Tammy Ventola MS, PT 01/09/22 7:49 AM

## 2021-09-03 ENCOUNTER — Ambulatory Visit: Payer: Medicare Other

## 2021-09-23 ENCOUNTER — Encounter: Payer: Medicare PPO | Attending: Physical Medicine and Rehabilitation | Admitting: Physical Medicine and Rehabilitation

## 2021-10-18 IMAGING — US US ABDOMEN LIMITED
1 series · 14 of 25 positions shown · non-contrast
Comparison: 03/30/2018

CLINICAL DATA: Cirrhosis

EXAM:
ULTRASOUND ABDOMEN LIMITED RIGHT UPPER QUADRANT

[Series 1: us abdomen limited · 0.17mm/px · 14 of 51 slices shown]
[im 1/51]
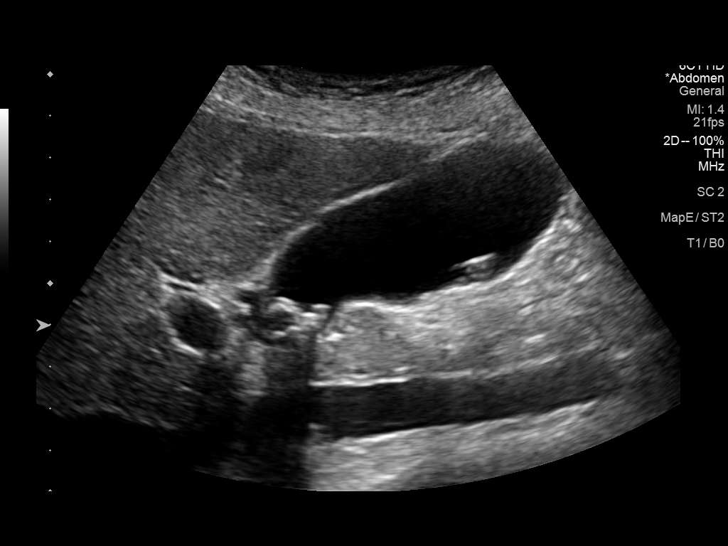
[im 5/51]
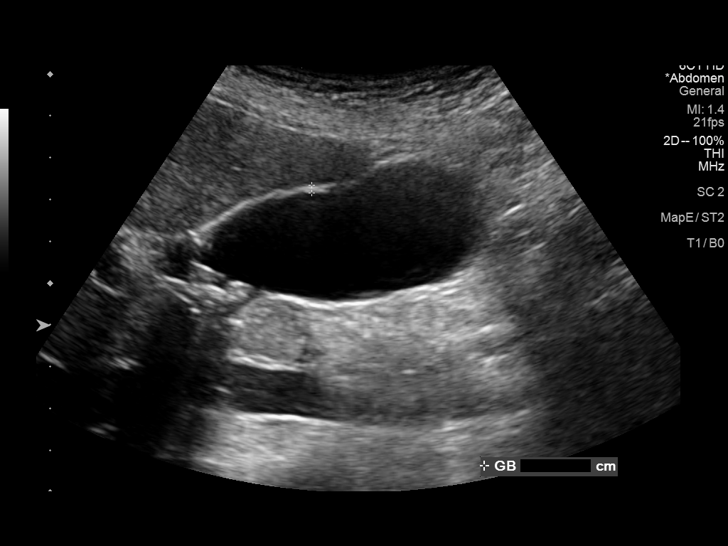
[im 9/51]
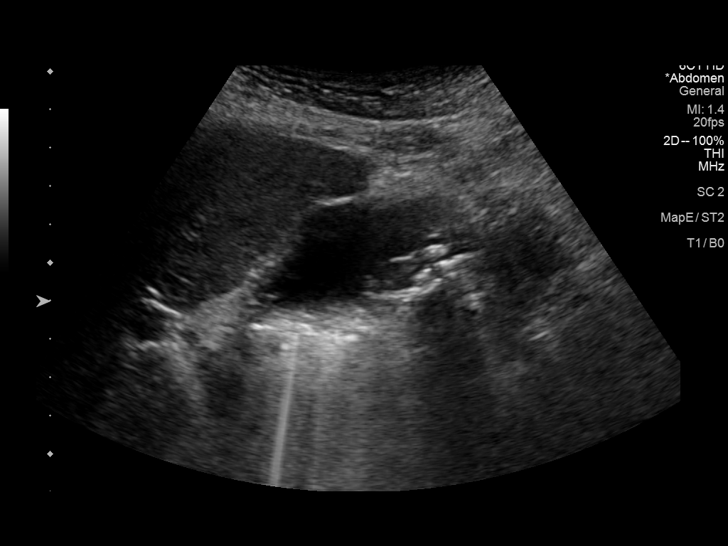
[im 13/51]
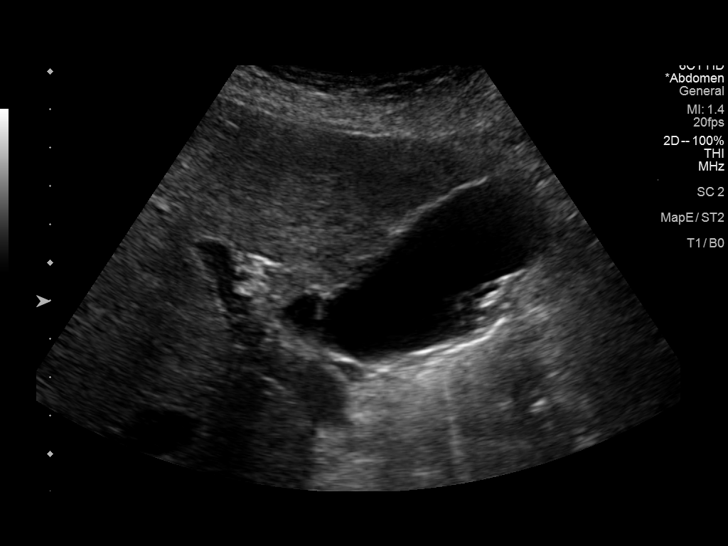
[im 17/51]
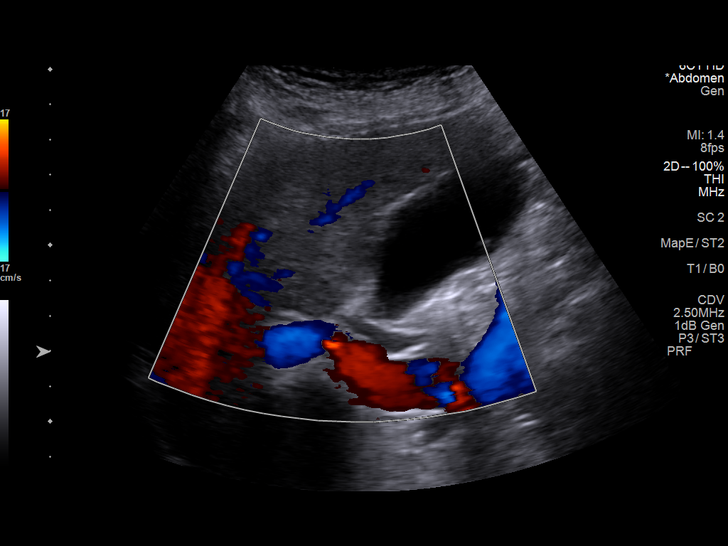
[im 19/51]
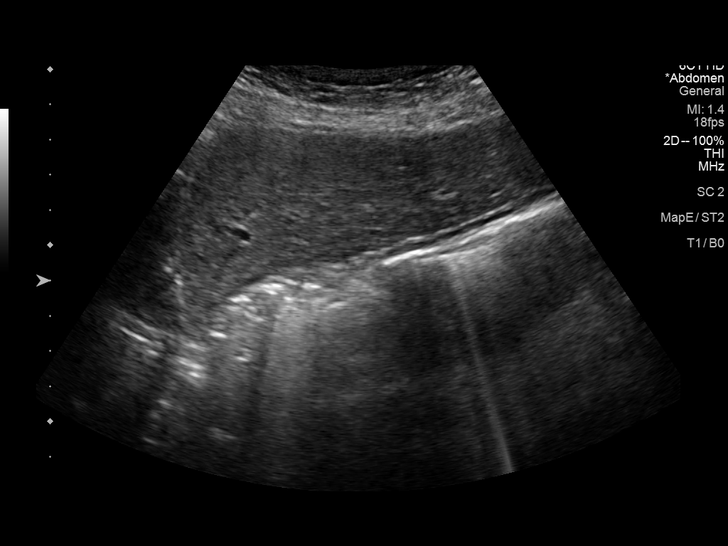
[im 23/51]
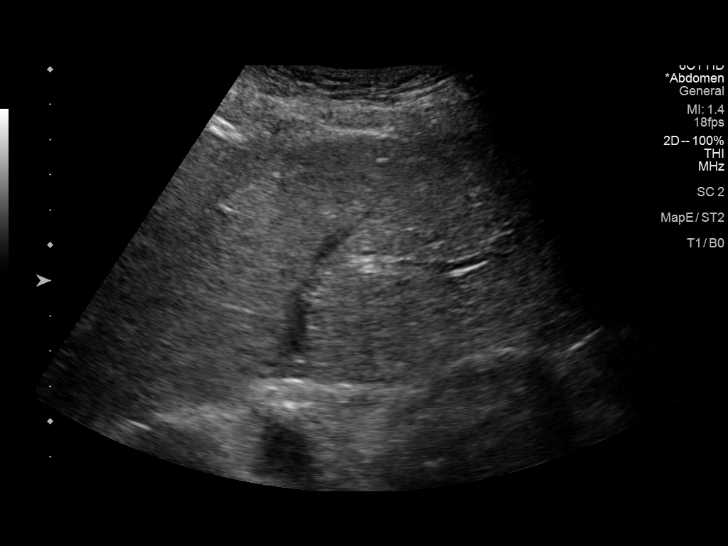
[im 28/51]
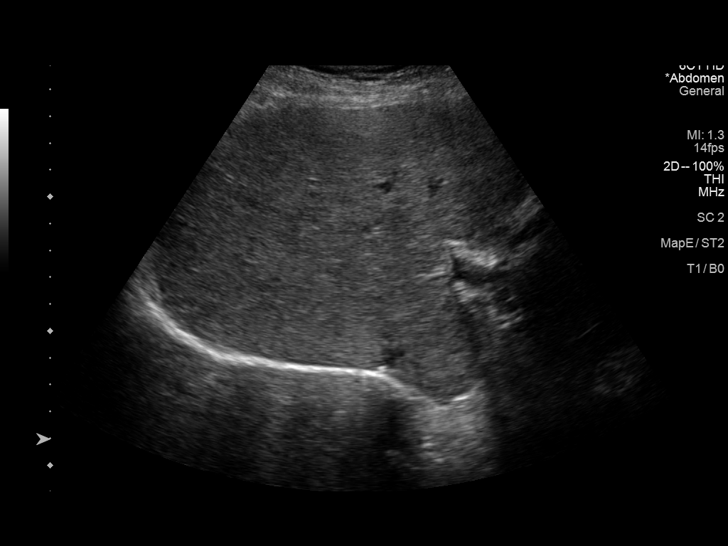
[im 32/51]
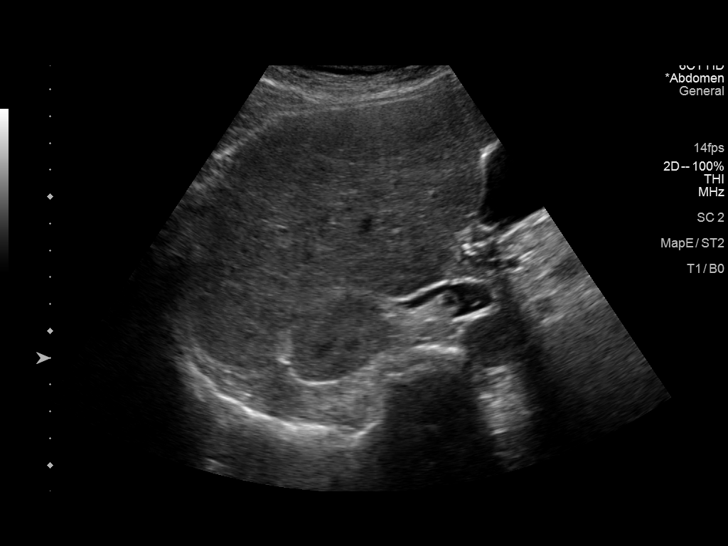
[im 34/51]
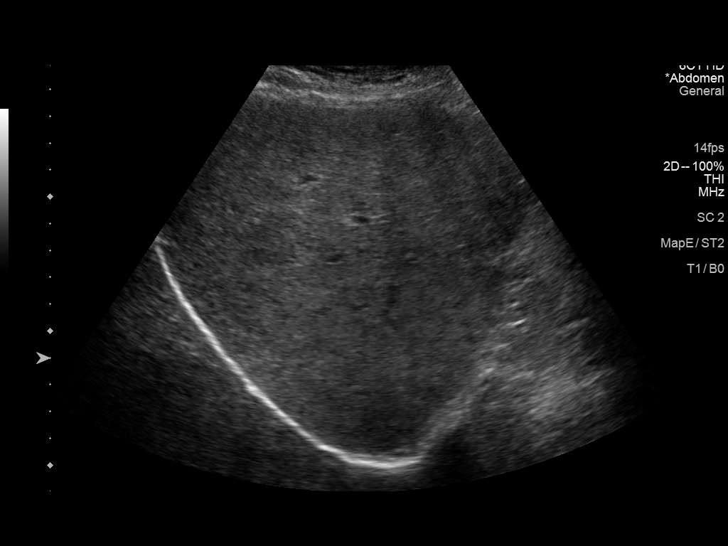
[im 38/51]
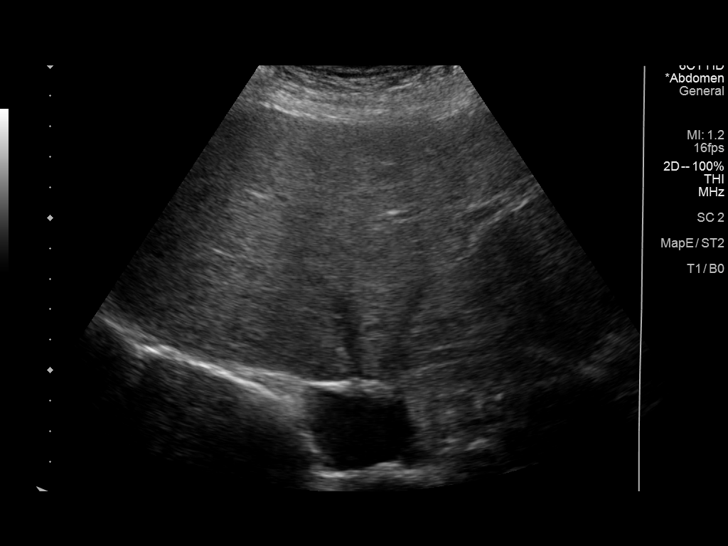
[im 42/51]
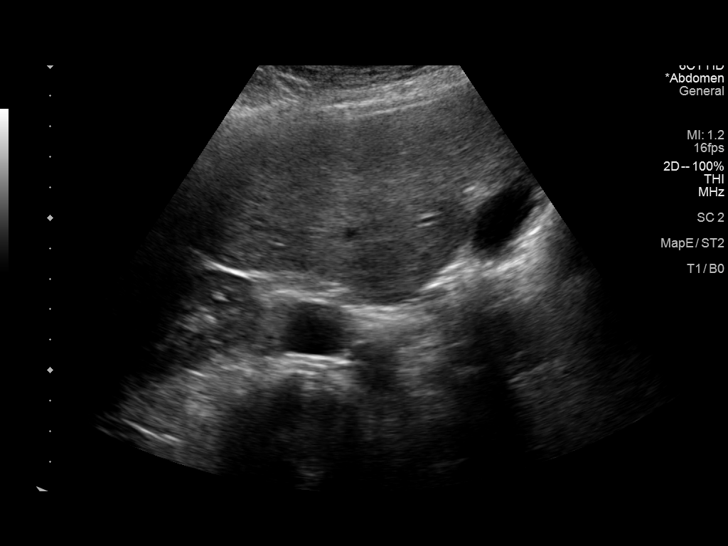
[im 46/51]
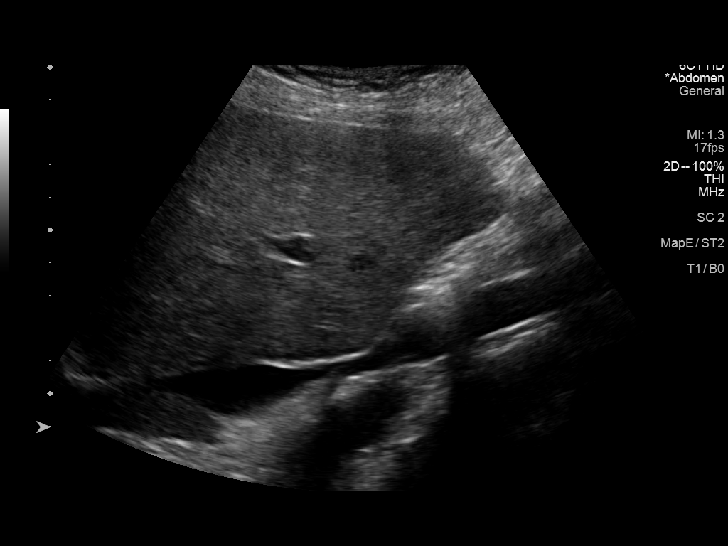
[im 51/51]
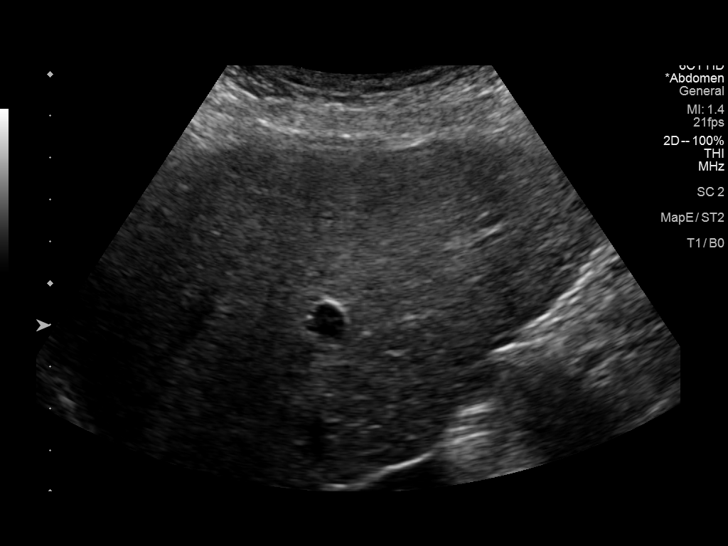

[14 of 25 positions shown; findings below may reference images not displayed]

FINDINGS: Gallbladder:

Multiple stones are identified within the gallbladder measuring up
to 6 mm. No gallbladder wall thickening, pericholecystic fluid or
sonographic Murphy's sign.

Common bile duct:

Diameter: 4 mm.

Liver:

The liver has a coarsened echotexture. No focal liver abnormality.
Portal vein is patent on color Doppler imaging with normal direction
of blood flow towards the liver.

Other: None.
IMPRESSION: 1. Cirrhosis. No focal liver abnormality to suggest hepatocellular
carcinoma.
2. Gallstones.

## 2021-11-03 ENCOUNTER — Ambulatory Visit: Payer: Medicare Other

## 2021-11-17 ENCOUNTER — Ambulatory Visit: Payer: Medicare PPO | Attending: Adult Health

## 2021-11-17 DIAGNOSIS — R293 Abnormal posture: Secondary | ICD-10-CM | POA: Insufficient documentation

## 2021-11-17 DIAGNOSIS — M6281 Muscle weakness (generalized): Secondary | ICD-10-CM | POA: Insufficient documentation

## 2021-11-17 DIAGNOSIS — R2689 Other abnormalities of gait and mobility: Secondary | ICD-10-CM | POA: Insufficient documentation

## 2021-11-17 DIAGNOSIS — S72402S Unspecified fracture of lower end of left femur, sequela: Secondary | ICD-10-CM | POA: Insufficient documentation

## 2021-11-17 DIAGNOSIS — M25662 Stiffness of left knee, not elsewhere classified: Secondary | ICD-10-CM | POA: Insufficient documentation

## 2021-11-17 DIAGNOSIS — G8929 Other chronic pain: Secondary | ICD-10-CM | POA: Insufficient documentation

## 2021-11-17 DIAGNOSIS — R262 Difficulty in walking, not elsewhere classified: Secondary | ICD-10-CM | POA: Insufficient documentation

## 2021-11-17 DIAGNOSIS — M25562 Pain in left knee: Secondary | ICD-10-CM | POA: Insufficient documentation

## 2021-11-18 ENCOUNTER — Encounter: Payer: Self-pay | Admitting: Gastroenterology

## 2021-11-27 ENCOUNTER — Inpatient Hospital Stay: Payer: Medicare PPO | Admitting: Adult Health

## 2021-11-27 ENCOUNTER — Encounter: Payer: Self-pay | Admitting: *Deleted

## 2021-12-02 ENCOUNTER — Inpatient Hospital Stay: Payer: Medicare PPO | Attending: Adult Health | Admitting: Adult Health

## 2021-12-02 VITALS — BP 156/67 | HR 65 | Temp 97.8°F | Resp 14 | Wt 163.0 lb

## 2021-12-02 DIAGNOSIS — Z8673 Personal history of transient ischemic attack (TIA), and cerebral infarction without residual deficits: Secondary | ICD-10-CM | POA: Diagnosis not present

## 2021-12-02 DIAGNOSIS — Z8041 Family history of malignant neoplasm of ovary: Secondary | ICD-10-CM | POA: Insufficient documentation

## 2021-12-02 DIAGNOSIS — M8588 Other specified disorders of bone density and structure, other site: Secondary | ICD-10-CM | POA: Diagnosis not present

## 2021-12-02 DIAGNOSIS — I10 Essential (primary) hypertension: Secondary | ICD-10-CM | POA: Insufficient documentation

## 2021-12-02 DIAGNOSIS — Z170 Estrogen receptor positive status [ER+]: Secondary | ICD-10-CM | POA: Diagnosis not present

## 2021-12-02 DIAGNOSIS — C50412 Malignant neoplasm of upper-outer quadrant of left female breast: Secondary | ICD-10-CM | POA: Insufficient documentation

## 2021-12-02 DIAGNOSIS — Z79811 Long term (current) use of aromatase inhibitors: Secondary | ICD-10-CM | POA: Diagnosis not present

## 2021-12-02 DIAGNOSIS — Z803 Family history of malignant neoplasm of breast: Secondary | ICD-10-CM | POA: Insufficient documentation

## 2021-12-02 DIAGNOSIS — Z800 Family history of malignant neoplasm of digestive organs: Secondary | ICD-10-CM | POA: Diagnosis not present

## 2021-12-02 DIAGNOSIS — Z923 Personal history of irradiation: Secondary | ICD-10-CM | POA: Diagnosis not present

## 2021-12-02 NOTE — Progress Notes (Signed)
SURVIVORSHIP VISIT:   BRIEF ONCOLOGIC HISTORY:  Oncology History  Malignant neoplasm of upper-outer quadrant of left breast in female, estrogen receptor positive (HCC)  07/02/2020 Initial Diagnosis   status post left breast upper outer quadrant biopsy 07/02/2020 for a clinical T1b N0, stage IA invasive ductal carcinoma, grade 1, estrogen and progesterone receptor positive, HER2 not amplified, with an MIB-1 of 5%   07/10/2020 Cancer Staging   Staging form: Breast, AJCC 8th Edition - Clinical stage from 07/10/2020: Stage IA (cT1b, cN0, cM0, G1, ER+, PR+, HER2-) - Signed by Lowella Dell, MD on 07/10/2020 Stage prefix: Initial diagnosis Histologic grading system: 3 grade system   07/17/2020 Genetic Testing   Negative hereditary cancer genetic testing: no pathogenic variants detected in [facility] BRCAPlus Panel or [facility] CancerNext-Expanded +RNAinsight.  The report dates are July 17, 2020 and July 22, 2020, respectively.   The BRCAplus panel offered by [employer] and includes sequencing and deletion/duplication analysis for the following 8 genes: ATM, BRCA1, BRCA2, CDH1, CHEK2, PALB2, PTEN, and TP53.  The CancerNext-Expanded gene panel offered by [facility] and includes sequencing, rearrangement, and RNA analysis for the following 77 genes: AIP, ALK, APC, ATM, AXIN2, BAP1, BARD1, BLM, BMPR1A, BRCA1, BRCA2, BRIP1, CDC73, CDH1, CDK4, CDKN1B, CDKN2A, CHEK2, CTNNA1, DICER1, FANCC, FH, FLCN, GALNT12, KIF1B, LZTR1, MAX, MEN1, MET, MLH1, MSH2, MSH3, MSH6, MUTYH, NBN, NF1, NF2, NTHL1, PALB2, PHOX2B, PMS2, POT1, PRKAR1A, PTCH1, PTEN, RAD51C, RAD51D, RB1, RECQL, RET, SDHA, SDHAF2, SDHB, SDHC, SDHD, SMAD4, SMARCA4, SMARCB1, SMARCE1, STK11, SUFU, TMEM127, TP53, TSC1, TSC2, VHL and XRCC2 (sequencing and deletion/duplication); EGFR, EGLN1, HOXB13, KIT, MITF, PDGFRA, POLD1, and POLE (sequencing only); EPCAM and GREM1 (deletion/duplication only).    07/26/2020 Surgery   left lumpectomy and sentinel lymph node sampling  07/26/2020 showed a pT1b pN0, stage IA invasive ductal carcinoma, grade 1, with negative margins.             (A) a total of 3 left axilla lymph nodes were removed   07/26/2020 Oncotype testing   Oncotype score of 16 predicts a risk of recurrence outside the breast within the next 9 years of 4% if the patient's only systemic therapy is an antiestrogen for 5 years.  It also predicts no benefit from chemotherapy   08/28/2020 - 09/25/2020 Radiation Therapy   adjuvant radiation 08/28/2020 through 09/25/2020 Site Technique Total Dose (Gy) Dose per Fx (Gy) Completed Fx Beam Energies  Breast, Left: Breast_Lt 3D 42.56/42.56 2.66 16/16 6XFFF, 10XFFF  Breast, Left: Breast_Lt_Bst 3D 8/8 2 4/4 6X, 10X    10/21/2020 -  Anti-estrogen oral therapy   Anastrozole    12/04/2020 Cancer Staging   Staging form: Breast, AJCC 8th Edition - Pathologic: Stage IA (pT1b, pN0, cM0, G1, ER+, PR+, HER2-) - Signed by Loa Socks, NP on 12/04/2020 Histologic grading system: 3 grade system     INTERVAL HISTORY:  Tammy [name] to review her survivorship care plan detailing her treatment course for breast cancer, as well as monitoring long-term side effects of that treatment, education regarding health maintenance, screening, and overall wellness and health promotion.     Overall, Tammy [name] reports feeling quite well.  She tells me that she is doing moderately well.  Her most recent mammogram occurred in 06/2021 and showed no evidence of malignancy and breast density category C.  She had previously fallen and injured her left leg and was overwhelmed because of this and felt like she needed to repeat her survivorship visit which we were happy to do today.  Her previous oncologist was Dr. Darnelle Catalan and she would like to change to Dr. Blake Divine.  She is feeling quite well today.  She is taking anastrozole with good tolerance.  REVIEW OF SYSTEMS:  Review of Systems  Constitutional:  Negative for appetite change, chills,  fatigue, fever and unexpected weight change.  HENT:   Negative for hearing loss, lump/mass and trouble swallowing.   Eyes:  Negative for eye problems and icterus.  Respiratory:  Negative for chest tightness, cough and shortness of breath.   Cardiovascular:  Negative for chest pain, leg swelling and palpitations.  Gastrointestinal:  Negative for abdominal distention, abdominal pain, constipation, diarrhea, nausea and vomiting.  Endocrine: Negative for hot flashes.  Genitourinary:  Negative for difficulty urinating.   Musculoskeletal:  Negative for arthralgias.  Skin:  Negative for itching and rash.  Neurological:  Negative for dizziness, extremity weakness, headaches and numbness.  Hematological:  Negative for adenopathy. Does not bruise/bleed easily.  Psychiatric/Behavioral:  Negative for depression. The patient is not nervous/anxious.    Breast: Denies any new nodularity, masses, tenderness, nipple changes, or nipple discharge.       PAST MEDICAL/SURGICAL HISTORY:  Past Medical History:  Diagnosis Date   Arthritis    Bipolar disorder (HCC)    Breast cancer (HCC)    Depression    Family history of breast cancer 07/10/2020   Family history of colon cancer 07/10/2020   Family history of ovarian cancer 07/10/2020   GERD (gastroesophageal reflux disease)    HTN (hypertension)    Stroke (HCC) 09/2012   slurred speech and left sided weakness/notes 10/04/2012   Past Surgical History:  Procedure Laterality Date   BREAST LUMPECTOMY WITH RADIOACTIVE SEED AND SENTINEL LYMPH NODE BIOPSY Left 07/26/2020   Procedure: LEFT BREAST LUMPECTOMY WITH RADIOACTIVE SEED AND SENTINEL LYMPH NODE BIOPSY;  Surgeon: Abigail Miyamoto, MD;  Location: [facility];  Service: General;  Laterality: Left;   CESAREAN SECTION  1986   fracture of leg     2 months ago   ORIF FEMUR FRACTURE Left 04/10/2021   Procedure: OPEN REDUCTION INTERNAL FIXATION (ORIF) DISTAL FEMUR FRACTURE;  Surgeon: Myrene Galas, MD;  Location: [facility];  Service: Orthopedics;  Laterality: Left;     ALLERGIES:  Allergies  Allergen Reactions   Lisinopril Cough    Other Reaction(s): Cough     CURRENT MEDICATIONS:  Outpatient Encounter Medications as of 12/02/2021  Medication Sig   acetaminophen (TYLENOL) 325 MG tablet Take by mouth.   amLODipine (NORVASC) 10 MG tablet Take 1 tablet (10 mg total) by mouth daily.   anastrozole (ARIMIDEX) 1 MG tablet Take 1 tablet (1 mg total) by mouth daily.   gabapentin (NEURONTIN) 100 MG capsule Take 1 capsule (100 mg total) by mouth 2 (two) times daily.   gabapentin (NEURONTIN) 300 MG capsule Take 300 mg by mouth daily.   hydrALAZINE (APRESOLINE) 25 MG tablet Take 1 tablet (25 mg total) by mouth 2 (two) times daily.   HYDROcodone-acetaminophen (NORCO/VICODIN) 5-325 MG tablet Take 1 tablet by mouth every 4 (four) hours as needed.   meloxicam (MOBIC) 15 MG tablet Take 15 mg by mouth daily.   Multiple Vitamin (MULTIVITAMIN) TABS Take 1 tablet by mouth daily.   pantoprazole (PROTONIX) 40 MG tablet Take 1 tablet (40 mg total) by mouth daily.   [DISCONTINUED] hydrochlorothiazide (HYDRODIURIL) 25 MG tablet Take 25 mg by mouth daily.   No facility-administered encounter medications on file as of 12/02/2021.     ONCOLOGIC FAMILY HISTORY:  Family History  Problem Relation Age of Onset   Other Mother        blood clots   Diabetes Brother    Neuropathy Brother        diabetic neuropathy   Diabetes Sister    Neuropathy Sister        diabetic neuropathy   Diabetes Paternal Aunt    Breast cancer Maternal Aunt        87   Breast cancer Other        PGM's sister; dx mid 58s   Colon cancer Maternal Aunt 38   Colon cancer Other        PGM's brother; dx 82s   Ovarian cancer Other        PGM's mother; dx unknown age     SOCIAL HISTORY:  Social History   Socioeconomic History   Marital status: Significant Other    Spouse name: Not on file   Number of children: 2    Years of education: Not on file   Highest education level: Not on file  Occupational History   Occupation: diabled  Tobacco Use   Smoking status: Every Day    Packs/day: 1.50    Years: 40.00    Total pack years: 60.00    Types: Cigarettes   Smokeless tobacco: Never   Tobacco comments:    has used e-cigarettes, no vaping   Vaping Use   Vaping Use: Never used  Substance and Sexual Activity   Alcohol use: Not Currently    Alcohol/week: 0.0 standard drinks of alcohol    Comment: 1 per week   Drug use: No   Sexual activity: Not on file  Other Topics Concern   Not on file  Social History Narrative   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   Caffeine: 1 cup in the morning    Social Determinants of Health   Financial Resource Strain: Not on file  Food Insecurity: Not on file  Transportation Needs: Not on file  Physical Activity: Not on file  Stress: Not on file  Social Connections: Not on file  Intimate Partner Violence: Not on file     OBSERVATIONS/OBJECTIVE:  BP (!) 156/67 (BP Location: Left Arm, Patient Position: Sitting) Comment: 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  Oropharynx clear and moist. No ulcerations or evidence of oropharyngeal candidiasis. Neck is supple.  NODES:  No cervical, supraclavicular, or axillary lymphadenopathy palpated.  BREAST EXAM: Left breast status postlumpectomy and radiation no sign of local recurrence right breast is benign. LUNGS:  Clear to auscultation bilaterally.  No wheezes or rhonchi. HEART:  Regular rate and rhythm. No murmur appreciated. ABDOMEN:  Soft, nontender.  Positive, normoactive bowel sounds. No organomegaly palpated. MSK:  No focal spinal tenderness to palpation. Full range of motion bilaterally in the upper  extremities. EXTREMITIES:  No peripheral edema.   SKIN:  Clear with no obvious rashes or skin changes. No nail dyscrasia. NEURO:  Nonfocal. Well oriented.  Appropriate affect.   LABORATORY DATA:  None for this visit.  DIAGNOSTIC IMAGING:  None for this visit.      ASSESSMENT AND PLAN:  Tammy [name] is a pleasant 66 y.o. female with Stage 1A left  breast invasive ductal carcinoma, ER+/PR+/HER2-, diagnosed in 06/2020, treated with lumpectomy, adjuvant radiation therapy, and anti-estrogen therapy with anastrozole beginning in September 2022.  She presents to the Survivorship Clinic for our initial meeting and routine follow-up post-completion of treatment for breast cancer.    1. Stage 1A left breast cancer:  Tammy [name] is continuing to recover from definitive treatment for breast cancer. She will follow-up with her medical oncologist, Dr. Mosetta Putt in 6 months with history and physical exam per surveillance protocol.  She will continue her anti-estrogen therapy with anastrozole. Thus far, she is tolerating the anastrozole well, with minimal side effects. She was instructed to make Dr. Pamelia Hoit or myself aware if she begins to experience any worsening side effects of the medication and I could see her back in clinic to help manage those side effects, as needed. Her mammogram is due May 2024. Today, a comprehensive survivorship care plan and treatment summary was reviewed with the patient today detailing her breast cancer diagnosis, treatment course, potential late/long-term effects of treatment, appropriate follow-up care with recommendations for the future, and patient education resources.  A copy of this summary, along with a letter will be sent to the patient's primary care provider via mail/fax/In Basket message after today's visit.    2. Bone health:  Given Tammy [name]'s age/history of breast cancer and her current treatment regimen including anti-estrogen therapy with anastrozole, she is at risk  for bone demineralization.  Her last DEXA scan was Jun 17, 2021 and showed osteopenia with a T score -1.9 in the AP spine.  I recommended that she repeat testing in May 2025.  She was given education on specific activities to promote bone health.  3. Cancer screening:  Due to Tammy [name]'s history and her age, she should receive screening for skin cancers, colon cancer, and gynecologic cancers.  The information and recommendations are listed on the patient's comprehensive care plan/treatment summary and were reviewed in detail with the patient.    4. Health maintenance and wellness promotion: Tammy [name] was encouraged to consume 5-7 servings of fruits and vegetables per day. We reviewed the "Nutrition Rainbow" handout.  She was also encouraged to engage in moderate to vigorous exercise for 30 minutes per day most days of the week. We discussed the [employer] fitness program, which is designed for cancer survivors to help them become more physically fit after cancer treatments.  She was instructed to limit her alcohol consumption and continue to abstain from tobacco use.   5. Support services/counseling: It is not uncommon for this period of the patient's cancer care trajectory to be one of many emotions and stressors.  She was given information regarding our available services and encouraged to contact me with any questions or for help enrolling in any of our support group/programs.    Follow up instructions:    -Return to cancer center in 6 months for follow-up with Dr. Mosetta Putt -Mammogram due in May 2024 -Bone density testing due in May 2025 -She is welcome to return back to the Survivorship Clinic at any time; no additional follow-up needed at this time.  -Consider referral back to survivorship as a long-term survivor for continued surveillance  The patient was provided an opportunity to ask questions and all were answered. The patient agreed with the plan and demonstrated an understanding of  the instructions.   Total encounter time:40 minutes*in face-to-face visit time, chart review, lab review, care coordination, order entry, and documentation of the encounter time.  Lillard Anes, NP 12/04/21 8:52 AM Medical Oncology and Hematology [facility] [address] [place], [place] [address] Tel. [phone]    Fax. [phone]  *Total Encounter Time as defined by the Centers for Medicare and Medicaid Services includes, in addition to the face-to-face time of a patient visit (documented in the note above) non-face-to-face time: obtaining and reviewing outside history, ordering and reviewing medications, tests or procedures, care coordination (communications with other health care professionals or caregivers) and documentation in the medical record.

## 2021-12-04 ENCOUNTER — Encounter: Payer: Self-pay | Admitting: Adult Health

## 2021-12-05 ENCOUNTER — Ambulatory Visit (AMBULATORY_SURGERY_CENTER): Payer: Self-pay

## 2021-12-05 VITALS — Ht 62.0 in | Wt 161.0 lb

## 2021-12-05 DIAGNOSIS — Z1211 Encounter for screening for malignant neoplasm of colon: Secondary | ICD-10-CM

## 2021-12-05 MED ORDER — NA SULFATE-K SULFATE-MG SULF 17.5-3.13-1.6 GM/177ML PO SOLN: 1.0000 | Freq: Once | ORAL | 0 refills | Status: AC

## 2021-12-05 NOTE — Progress Notes (Signed)
No egg or soy allergy known to patient  No issues known to pt with past sedation with any surgeries or procedures Patient denies ever being told they had issues or difficulty with intubation  No FH of Malignant Hyperthermia Pt is not on diet pills Pt is not on home 02  Pt is not on blood thinners  Pt reports issues with constipation - patient reports taking Colace daily to assist with constipation (daily BM with meds) No A fib or A flutter Have any cardiac testing pending--NO Pt instructed to use Singlecare.com or GoodRx for a price reduction on prep ---GoodRx coupon for [facility] given to patient at time of PV;  Insurance verified during [place] appt=[employer]  Patient's chart reviewed by Osvaldo Angst CNRA prior to previsit and patient appropriate for the [facility].  Previsit completed and red dot placed by patient's name on their procedure day (on provider's schedule).

## 2021-12-31 ENCOUNTER — Encounter: Payer: Self-pay | Admitting: Gastroenterology

## 2022-01-05 ENCOUNTER — Other Ambulatory Visit: Payer: Self-pay | Admitting: Nurse Practitioner

## 2022-01-05 DIAGNOSIS — K7469 Other cirrhosis of liver: Secondary | ICD-10-CM

## 2022-01-07 ENCOUNTER — Encounter: Payer: Self-pay | Admitting: Certified Registered Nurse Anesthetist

## 2022-01-08 ENCOUNTER — Ambulatory Visit (AMBULATORY_SURGERY_CENTER): Payer: Medicare PPO | Admitting: Gastroenterology

## 2022-01-08 ENCOUNTER — Encounter: Payer: Self-pay | Admitting: Gastroenterology

## 2022-01-08 VITALS — BP 173/80 | HR 65 | Temp 98.0°F | Resp 15 | Ht 62.0 in | Wt 161.0 lb

## 2022-01-08 DIAGNOSIS — K635 Polyp of colon: Secondary | ICD-10-CM | POA: Diagnosis not present

## 2022-01-08 DIAGNOSIS — D124 Benign neoplasm of descending colon: Secondary | ICD-10-CM | POA: Diagnosis not present

## 2022-01-08 DIAGNOSIS — D128 Benign neoplasm of rectum: Secondary | ICD-10-CM

## 2022-01-08 DIAGNOSIS — Z1211 Encounter for screening for malignant neoplasm of colon: Secondary | ICD-10-CM

## 2022-01-08 DIAGNOSIS — D125 Benign neoplasm of sigmoid colon: Secondary | ICD-10-CM | POA: Diagnosis not present

## 2022-01-08 DIAGNOSIS — K5730 Diverticulosis of large intestine without perforation or abscess without bleeding: Secondary | ICD-10-CM

## 2022-01-08 DIAGNOSIS — D122 Benign neoplasm of ascending colon: Secondary | ICD-10-CM | POA: Diagnosis not present

## 2022-01-08 MED ORDER — SODIUM CHLORIDE 0.9 % IV SOLN: 500.0000 mL | Freq: Once | INTRAVENOUS | Status: DC

## 2022-01-08 NOTE — Progress Notes (Signed)
GASTROENTEROLOGY PROCEDURE H&P NOTE   Primary Care Physician: [name], MD    Reason for Procedure:  Colon Cancer screening  Plan:    Colonoscopy  Patient is appropriate for endoscopic procedure(s) in the ambulatory ([place]) setting.  The nature of the procedure, as well as the risks, benefits, and alternatives were carefully and thoroughly reviewed with the patient. Ample time for discussion and questions allowed. The patient understood, was satisfied, and agreed to proceed.     HPI: Tammy [name] is a 66 y.o. female who presents for colonoscopy for routine Colon Cancer screening.  No active GI symptoms.    Fhx n/f maternal aunt with CRC at age 80.   Past Medical History:  Diagnosis Date   Anemia    hx of   Anxiety    on meds   Arthritis    back/bilateral ankles   Bipolar disorder ([place])    Breast cancer ([place]) 2022   LEFT breast-radiation   Depression    on meds   Family history of breast cancer 07/10/2020   Family history of colon cancer 07/10/2020   Family history of ovarian cancer 07/10/2020   GERD (gastroesophageal reflux disease)    on meds   Heart murmur    dx by PCP   HTN (hypertension)    on meds   Hx of radiation therapy    Osteopenia    Stroke ([place]) 09/2012   slurred speech and LEFT sided weakness/notes 10/04/2012    Past Surgical History:  Procedure Laterality Date   ANKLE FRACTURE SURGERY Left 2018   BREAST LUMPECTOMY WITH RADIOACTIVE SEED AND SENTINEL LYMPH NODE BIOPSY Left 07/26/2020   Procedure: LEFT BREAST LUMPECTOMY WITH RADIOACTIVE SEED AND SENTINEL LYMPH NODE BIOPSY;  Surgeon: [name], MD;  Location: [facility];  Service: General;  Laterality: Left;   [place]   ORIF FEMUR FRACTURE Left 04/10/2021   Procedure: OPEN REDUCTION INTERNAL FIXATION (ORIF) DISTAL FEMUR FRACTURE;  Surgeon: Altamese [place], MD;  Location: [facility];  Service:  Orthopedics;  Laterality: Left;    Prior to Admission medications   Medication Sig Start Date End Date Taking? Authorizing Provider  acetaminophen (TYLENOL) 325 MG tablet Take 325 mg by mouth every 4 (four) hours as needed for mild pain. 04/22/21  Yes [provider]  amLODipine (NORVASC) 10 MG tablet Take 1 tablet (10 mg total) by mouth daily. 04/22/21  Yes Setzer, Edman Circle, PA-C  anastrozole (ARIMIDEX) 1 MG tablet Take 1 tablet (1 mg total) by mouth daily. 03/05/21  Yes Causey, Charlestine Massed, NP  buPROPion (WELLBUTRIN SR) 150 MG 12 hr tablet Take 150 mg by mouth 2 (two) times daily. 10/04/21  Yes [provider]  gabapentin (NEURONTIN) 100 MG capsule Take 1 capsule (100 mg total) by mouth 2 (two) times daily. 04/22/21  Yes Setzer, Edman Circle, PA-C  hydrALAZINE (APRESOLINE) 25 MG tablet Take 1 tablet (25 mg total) by mouth 2 (two) times daily. 04/22/21  Yes Setzer, Edman Circle, PA-C  HYDROcodone-acetaminophen (NORCO/VICODIN) 5-325 MG tablet Take 1 tablet by mouth every 4 (four) hours as needed. 08/11/21  Yes [provider]  meloxicam (MOBIC) 15 MG tablet Take 15 mg by mouth daily. 07/22/21  Yes [provider]  Multiple Vitamin (MULTIVITAMIN) TABS Take 1 tablet by mouth daily. 04/22/21  Yes Setzer, Edman Circle, PA-C  pantoprazole (PROTONIX) 40 MG tablet Take 1 tablet (40 mg total) by  mouth daily. 04/22/21  Yes Setzer, Edman Circle, PA-C  Docusate Sodium (COLACE PO) Take 1 tablet by mouth daily at 6 (six) AM.    [provider]  gabapentin (NEURONTIN) 300 MG capsule Take 300 mg by mouth daily. 06/24/21   [provider]    Current Outpatient Medications  Medication Sig Dispense Refill   acetaminophen (TYLENOL) 325 MG tablet Take 325 mg by mouth every 4 (four) hours as needed for mild pain.     amLODipine (NORVASC) 10 MG tablet Take 1 tablet (10 mg total) by mouth daily. 30 tablet 0   anastrozole (ARIMIDEX) 1 MG tablet Take 1 tablet (1 mg total) by mouth daily.  90 tablet 4   buPROPion (WELLBUTRIN SR) 150 MG 12 hr tablet Take 150 mg by mouth 2 (two) times daily.     gabapentin (NEURONTIN) 100 MG capsule Take 1 capsule (100 mg total) by mouth 2 (two) times daily. 30 capsule 0   hydrALAZINE (APRESOLINE) 25 MG tablet Take 1 tablet (25 mg total) by mouth 2 (two) times daily. 60 tablet 0   HYDROcodone-acetaminophen (NORCO/VICODIN) 5-325 MG tablet Take 1 tablet by mouth every 4 (four) hours as needed.     meloxicam (MOBIC) 15 MG tablet Take 15 mg by mouth daily.     Multiple Vitamin (MULTIVITAMIN) TABS Take 1 tablet by mouth daily. 30 tablet    pantoprazole (PROTONIX) 40 MG tablet Take 1 tablet (40 mg total) by mouth daily. 30 tablet 0   Docusate Sodium (COLACE PO) Take 1 tablet by mouth daily at 6 (six) AM.     gabapentin (NEURONTIN) 300 MG capsule Take 300 mg by mouth daily.     Current Facility-Administered Medications  Medication Dose Route Frequency Provider Last Rate Last Admin   0.9 %  sodium chloride infusion  500 mL Intravenous Once Maleigh Bagot V, DO        Allergies as of 01/08/2022 - Review Complete 01/08/2022  Allergen Reaction Noted   Lisinopril Cough 04/09/2021    Family History  Problem Relation Age of Onset   Other Mother        blood clots   Diabetes Sister    Neuropathy Sister        diabetic neuropathy   Diabetes Brother    Neuropathy Brother        diabetic neuropathy   Breast cancer Maternal Aunt        32   Colon cancer Maternal Aunt 69   Diabetes Paternal Aunt    Breast cancer Other        PGM's sister; dx mid 66s   Colon cancer Other        PGM's brother; dx 16s   Ovarian cancer Other        PGM's mother; dx unknown age   Esophageal cancer Neg Hx    Rectal cancer Neg Hx    Stomach cancer Neg Hx    Colon polyps Neg Hx     Social History   Socioeconomic History   Marital status: Significant Other    Spouse name: Not on file   Number of children: 2   Years of education: Not on file   Highest education  level: Not on file  Occupational History   Occupation: diabled  Tobacco Use   Smoking status: Former    Packs/day: 1.50    Years: 40.00    Total pack years: 60.00    Types: Cigarettes   Smokeless tobacco: Never   Tobacco  comments:    has used e-cigarettes, no vaping   Vaping Use   Vaping Use: Never used  Substance and Sexual Activity   Alcohol use: Not Currently   Drug use: No   Sexual activity: Not on file  Other Topics Concern   Not on file  Social History Narrative   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   Caffeine: 1 cup in the morning    Social Determinants of Health   Financial Resource Strain: Not on file  Food Insecurity: Not on file  Transportation Needs: Not on file  Physical Activity: Not on file  Stress: Not on file  Social Connections: Not on file  Intimate Partner Violence: Not on file    Physical Exam: Vital signs in last 24 hours: '@BP'$  114/69   Pulse 89   Temp 98 F (36.7 C) (Temporal)   Ht '5\' 2"'$  (1.575 m)   Wt 161 lb (73 kg)   SpO2 96%   BMI 29.45 kg/m  GEN: NAD EYE: Sclerae anicteric ENT: MMM CV: Non-tachycardic Pulm: CTA b/l GI: Soft, NT/ND NEURO:  Alert & Oriented x 3   Gerrit Heck, DO [facility]   01/08/2022 8:22 AM

## 2022-01-08 NOTE — Progress Notes (Signed)
Report given to PACU, vss 

## 2022-01-08 NOTE — Progress Notes (Signed)
Pt's states no medical or surgical changes since previsit or office visit. 

## 2022-01-08 NOTE — Progress Notes (Signed)
Called to room to assist during endoscopic procedure.  Patient ID and intended procedure confirmed with present staff. Received instructions for my participation in the procedure from the performing physician.  

## 2022-01-08 NOTE — Patient Instructions (Signed)
Please read handouts provided. Continue present medications. Await pathology results. Return to GI office as needed.   YOU HAD AN ENDOSCOPIC PROCEDURE TODAY AT [facility]:   Refer to the procedure report that was given to you for any specific questions about what was found during the examination.  If the procedure report does not answer your questions, please call your gastroenterologist to clarify.  If you requested that your care partner not be given the details of your procedure findings, then the procedure report has been included in a sealed envelope for you to review at your convenience later.  YOU SHOULD EXPECT: Some feelings of bloating in the abdomen. Passage of more gas than usual.  Walking can help get rid of the air that was put into your GI tract during the procedure and reduce the bloating. If you had a lower endoscopy (such as a colonoscopy or flexible sigmoidoscopy) you may notice spotting of blood in your stool or on the toilet paper. If you underwent a bowel prep for your procedure, you may not have a normal bowel movement for a few days.  Please Note:  You might notice some irritation and congestion in your nose or some drainage.  This is from the oxygen used during your procedure.  There is no need for concern and it should clear up in a day or so.  SYMPTOMS TO REPORT IMMEDIATELY:  Following lower endoscopy (colonoscopy or flexible sigmoidoscopy):  Excessive amounts of blood in the stool  Significant tenderness or worsening of abdominal pains  Swelling of the abdomen that is new, acute  Fever of 100F or higher  For urgent or emergent issues, a gastroenterologist can be reached at any hour by calling [phone]. Do not use MyChart messaging for urgent concerns.    DIET:  We do recommend a small meal at first, but then you may proceed to your regular diet.  Drink plenty of fluids but you should avoid alcoholic beverages for 24 hours.  ACTIVITY:  You  should plan to take it easy for the rest of today and you should NOT DRIVE or use heavy machinery until tomorrow (because of the sedation medicines used during the test).    FOLLOW UP: Our staff will call the number listed on your records the next business day following your procedure.  We will call around 7:15- 8:00 am to check on you and address any questions or concerns that you may have regarding the information given to you following your procedure. If we do not reach you, we will leave a message.     If any biopsies were taken you will be contacted by phone or by letter within the next 1-3 weeks.  Please call us at [phone] if you have not heard about the biopsies in 3 weeks.    SIGNATURES/CONFIDENTIALITY: You and/or your care partner have signed paperwork which will be entered into your electronic medical record.  These signatures attest to the fact that that the information above on your After Visit Summary has been reviewed and is understood.  Full responsibility of the confidentiality of this discharge information lies with you and/or your care-partner.

## 2022-01-08 NOTE — Op Note (Signed)
[facility] Patient Name: Tammy [name] Procedure Date: 01/08/2022 8:23 AM MRN: [id number] Endoscopist: Gerrit Heck , MD, [id number] Age: 66 Referring MD:  Date of Birth: 27-Nov-1955 Gender: Female Account #: [id number] Procedure:                Colonoscopy Indications:              Screening for colorectal malignant neoplasm, This                            is the patient's first colonoscopy Medicines:                Monitored Anesthesia Care Procedure:                Pre-Anesthesia Assessment:                           - Prior to the procedure, a History and Physical                            was performed, and patient medications and                            allergies were reviewed. The patient's tolerance of                            previous anesthesia was also reviewed. The risks                            and benefits of the procedure and the sedation                            options and risks were discussed with the patient.                            All questions were answered, and informed consent                            was obtained. Prior Anticoagulants: The patient has                            taken no anticoagulant or antiplatelet agents. ASA                            Grade Assessment: III - A patient with severe                            systemic disease. After reviewing the risks and                            benefits, the patient was deemed in satisfactory                            condition to undergo the procedure.  After obtaining informed consent, the colonoscope                            was passed under direct vision. Throughout the                            procedure, the patient's blood pressure, pulse, and                            oxygen saturations were monitored continuously. The                            Olympus Scope [id number] was introduced through the                            anus and advanced  to the the cecum, identified by                            appendiceal orifice and ileocecal valve. The                            colonoscopy was performed without difficulty. The                            patient tolerated the procedure well. The quality                            of the bowel preparation was good. The ileocecal                            valve, appendiceal orifice, and rectum were                            photographed. Scope In: 8:28:33 AM Scope Out: 8:59:31 AM Scope Withdrawal Time: 0 hours 27 minutes 16 seconds  Total Procedure Duration: 0 hours 30 minutes 58 seconds  Findings:                 The perianal and digital rectal examinations were                            normal.                           A 6 mm polyp was found in the ascending colon. The                            polyp was sessile. The polyp was removed with a                            cold snare. Resection and retrieval were complete.                            Estimated blood loss was minimal.  A 5 mm polyp was found in the descending colon. The                            polyp was sessile. The polyp was removed with a                            cold snare. Resection and retrieval were complete.                            Estimated blood loss was minimal.                           Seven sessile polyps were found in the rectum and                            distal sigmoid colon. The polyps were 2 to 4 mm in                            size. These polyps were removed with a cold snare.                            Resection and retrieval were complete. Estimated                            blood loss was minimal.                           Multiple medium-mouthed and small-mouthed                            diverticula were found in the ascending colon.                           The retroflexed view of the distal rectum and anal                            verge was normal and  showed no anal or rectal                            abnormalities. Complications:            No immediate complications. Estimated Blood Loss:     Estimated blood loss was minimal. Impression:               - One 6 mm polyp in the ascending colon, removed                            with a cold snare. Resected and retrieved.                           - One 5 mm polyp in the descending colon, removed                            with  a cold snare. Resected and retrieved.                           - Seven 2 to 4 mm polyps in the rectum and in the                            distal sigmoid colon, removed with a cold snare.                            Resected and retrieved.                           - Diverticulosis in the ascending colon.                           - The distal rectum and anal verge are normal on                            retroflexion view. Recommendation:           - Patient has a contact number available for                            emergencies. The signs and symptoms of potential                            delayed complications were discussed with the                            patient. Return to normal activities tomorrow.                            Written discharge instructions were provided to the                            patient.                           - Resume previous diet.                           - Continue present medications.                           - Await pathology results.                           - Repeat colonoscopy for surveillance based on                            pathology results.                           - Return to GI office PRN. Gerrit Heck, MD 01/08/2022 9:04:11 AM

## 2022-01-09 ENCOUNTER — Telehealth: Payer: Self-pay | Admitting: *Deleted

## 2022-01-09 NOTE — Telephone Encounter (Signed)
  Follow up Call-     01/08/2022    7:58 AM  Call back number  Post procedure Call Back phone  # [phone]  Permission to leave phone message Yes     Patient questions:  Do you have a fever, pain , or abdominal swelling? No. Pain Score  0 *  Have you tolerated food without any problems? Yes.    Have you been able to return to your normal activities? Yes.    Do you have any questions about your discharge instructions: Diet   No. Medications  No. Follow up visit  No.  Do you have questions or concerns about your Care? No.  Actions: * If pain score is 4 or above: No action needed, pain <4.

## 2022-01-14 ENCOUNTER — Ambulatory Visit
Admission: RE | Admit: 2022-01-14 | Discharge: 2022-01-14 | Disposition: A | Discharge status: Home / Self Care | Payer: Medicare PPO | Source: Ambulatory Visit | Attending: Nurse Practitioner | Admitting: Nurse Practitioner

## 2022-01-14 DIAGNOSIS — K7469 Other cirrhosis of liver: Secondary | ICD-10-CM

## 2022-01-16 ENCOUNTER — Encounter: Payer: Self-pay | Admitting: Gastroenterology

## 2022-02-12 ENCOUNTER — Other Ambulatory Visit: Payer: Self-pay | Admitting: Orthopedic Surgery

## 2022-02-12 DIAGNOSIS — S72462K Displaced supracondylar fracture with intracondylar extension of lower end of left femur, subsequent encounter for closed fracture with nonunion: Secondary | ICD-10-CM

## 2022-02-16 ENCOUNTER — Other Ambulatory Visit: Payer: Medicare Other

## 2022-02-18 ENCOUNTER — Ambulatory Visit
Admission: RE | Admit: 2022-02-18 | Discharge: 2022-02-18 | Disposition: A | Discharge status: Home / Self Care | Payer: Medicare Other | Source: Ambulatory Visit | Attending: Orthopedic Surgery | Admitting: Orthopedic Surgery

## 2022-02-18 DIAGNOSIS — S72462K Displaced supracondylar fracture with intracondylar extension of lower end of left femur, subsequent encounter for closed fracture with nonunion: Secondary | ICD-10-CM

## 2022-03-13 ENCOUNTER — Other Ambulatory Visit: Payer: Self-pay

## 2022-03-13 ENCOUNTER — Encounter (HOSPITAL_COMMUNITY): Payer: Self-pay | Admitting: Orthopedic Surgery

## 2022-03-13 NOTE — Progress Notes (Signed)
Tammy [name]- Instructions   Your procedure is scheduled on Tues., Feb. 6, 2024 from 10:08AM-3:13PM.  Report to [facility] Main Entrance "A" at 7:50 A.M., then check in with the Admitting office.  Call this number if you have problems the morning of surgery:  [phone]             If you experience any cold or flu symptoms such as cough, fever, chills, shortness of breath, etc. between now and your scheduled surgery, please notify us at the above         number.  Masks are now required throughout our facilities due to the increasing cases of Covid, Flu, and RSV infections.   Remember:  Do not eat after midnight on Feb. 5th  You may drink clear liquids until 3 hours (6:30AM) prior to surgery time, the morning of your surgery.   Clear liquids allowed are: Water, Non-Citrus Juices (without pulp), Carbonated Beverages, Clear Tea, Black Coffee ONLY (NO MILK, CREAM OR POWDERED CREAMER of any kind), and Gatorade    Take these medicines the morning of surgery with A SIP OF WATER:  AmLODipine (NORVASC)  Anastrozole (ARIMIDEX)  BuPROPion (WELLBUTRIN SR)  Gabapentin (NEURONTIN)  HydrALAZINE (APRESOLINE)  Pantoprazole (PROTONIX)   If Needed: HYDROcodone-acetaminophen (NORCO/VICODIN)   As of today, STOP taking any Aspirin (unless otherwise instructed by your surgeon) Aleve, Naproxen, Ibuprofen, Meloxicam (MOBIC), Motrin, Advil, Goody's, BC's, all herbal medications, fish oil, and all vitamins.          Do not wear jewelry. Do not wear lotions, powders, perfumes or deodorant. Do not shave 48 hours prior to surgery.  Do not bring valuables to the hospital. Do not wear nail polish, gel polish, artificial nails, or any other type of covering on natural nails (fingers and toes) If you have artificial nails or gel coating that need to be removed by a nail salon, please have this removed prior to surgery. Artificial nails or gel coating may interfere with anesthesia's ability to adequately monitor your  vital signs.  [facility] is not responsible for any belongings or valuables.    Do NOT Smoke (Tobacco/Vaping)  24 hours prior to your procedure  If you use a CPAP at night, you may bring your mask for your overnight stay.   Contacts, glasses, hearing aids, dentures or partials may not be worn into surgery, please bring cases for these belongings   For patients admitted to the hospital, discharge time will be determined by your treatment team.   Patients discharged the day of surgery will not be allowed to drive home, and someone needs to stay with them for 24 hours.  Special instructions:    Oral Hygiene is also important to reduce your risk of infection.  Remember - BRUSH YOUR TEETH THE MORNING OF SURGERY WITH YOUR REGULAR TOOTHPASTE  [facility]- Preparing For Surgery  Before surgery, you can play an important role. Because skin is not sterile, your skin needs to be as free of germs as possible. You can reduce the number of germs on your skin by washing with Antibacterial Soap before surgery.     Please follow these instructions carefully.     Shower the NIGHT BEFORE SURGERY and the MORNING OF SURGERY with Antibacterial Soap.   Pat yourself dry with a CLEAN TOWEL.  Wear CLEAN PAJAMAS to bed the night before surgery  Place CLEAN SHEETS on your bed the night before your surgery  DO NOT SLEEP WITH PETS.  Day of Surgery:  Take  a shower with Antibacterial soap. Wear Clean/Comfortable clothing the morning of surgery Do not apply any deodorants/lotions.   Remember to brush your teeth WITH YOUR REGULAR TOOTHPASTE.   If you test positive for Covid, or been in contact with anyone that has tested positive in the last 10 days, please notify your surgeon.  SURGICAL WAITING ROOM VISITATION Patients having surgery or a procedure may have no more than 2 support people in the waiting area - these visitors may rotate.   Children under the age of 73 must have an adult with them who is  not the patient. If the patient needs to stay at the hospital during part of their recovery, the visitor guidelines for inpatient rooms apply. Pre-op nurse will coordinate an appropriate time for 1 support person to accompany patient in pre-op.  This support person may not rotate.   Please refer to the [facility] website for the visitor guidelines for Inpatients (after your surgery is over and you are in a regular room).

## 2022-03-13 NOTE — Progress Notes (Signed)
PCP - Dr. Randell Patient  Cardiologist - Denies  EP- Denies  Endocrine- Denies  Pulm- Denies  Chest x-ray - Denies  EKG - 03/17/22- Day of surgery  Stress Test - Denies  ECHO - 10/04/12 (E)  Cardiac Cath - Denies  AICD-na PM-na LOOP-na  Nerve Stimulator- Denies   Dialysis- Denies  Sleep Study - Denies CPAP - Denies  LABS- 03/17/22: CBC, CMP  ASA- Denies  ERAS- Yes- clears until 0630  HA1C- Denies  Anesthesia- No  Pt denies having chest pain, sob, or fever during the pre-op phone call. All instructions explained to the pt, with a verbal understanding of the material. Pt also instructed to wear a mask and social distance if she goes out. The opportunity to ask questions was provided.

## 2022-03-15 ENCOUNTER — Other Ambulatory Visit: Payer: Self-pay | Admitting: Adult Health

## 2022-03-16 ENCOUNTER — Inpatient Hospital Stay (HOSPITAL_COMMUNITY): Payer: Medicare Other | Admitting: Anesthesiology

## 2022-03-16 NOTE — H&P (Signed)
Orthopaedic Trauma Service (OTS) Consult   Patient ID: Tammy [name] MRN: [id number] DOB/AGE: 68-Aug-1957 68 y.o.    HPI: Tammy [name] is an 67 y.o. female s/p ORIF R distal femur fracture 04/2021. Pt had done very well and felt to have been radiographically united. Unfortunately she had an acute change in her function with increased pain, difficulty weightbearing and mobilizing early December 2023. Denies any acute trauma, falls, etc. Pt presented to the office and was found to have a broken plate. CT confirmed nonunion with sizable defect. Recommendation was made to return to OR for repair of nonunion. Pt is in agreement with the plan. DEXA from 06/2021 shows osteopenia in lumbar spine and L femur with t-score of -1.9 and -1.6 respectively.   73 OH vitamin D levels at the time of index fracture were 10.88 ng/mL, pt was placed on vitamin d supplementation (D2 and D3)  History of nicotine use   Potential issues leading to nonunion include use of aromatase inhibitor (Arimidex) for breast CA  No known history of DM  Fracture was not open   Past Medical History:  Diagnosis Date   Anemia    hx of   Anxiety    on meds   Arthritis    back/bilateral ankles   Bipolar disorder ([place])    Breast cancer ([place]) 2022   Tammy [name]   Depression    on meds   Family history of breast cancer 07/10/2020   Family history of colon cancer 07/10/2020   Family history of ovarian cancer 07/10/2020   GERD (gastroesophageal reflux disease)    on meds   Heart murmur    dx by PCP   HTN (hypertension)    on meds   Hx of radiation therapy    Osteopenia    Stroke ([place]) 09/2012   slurred speech and Tammy sided weakness/notes 10/04/2012    Past Surgical History:  Procedure Laterality Date   ANKLE FRACTURE SURGERY Tammy 2018   BREAST LUMPECTOMY WITH RADIOACTIVE SEED AND SENTINEL LYMPH NODE BIOPSY Tammy 07/26/2020   Procedure: Tammy BREAST LUMPECTOMY WITH  RADIOACTIVE SEED AND SENTINEL LYMPH NODE BIOPSY;  Surgeon: [name], MD;  Location: [facility];  Service: General;  Laterality: Tammy;   [place]   ORIF FEMUR FRACTURE Tammy 04/10/2021   Procedure: OPEN REDUCTION INTERNAL FIXATION (ORIF) DISTAL FEMUR FRACTURE;  Surgeon: Altamese [place], MD;  Location: [facility];  Service: Orthopedics;  Laterality: Tammy;    Family History  Problem Relation Age of Onset   Other Mother        blood clots   Diabetes Sister    Neuropathy Sister        diabetic neuropathy   Diabetes Brother    Neuropathy Brother        diabetic neuropathy   Breast cancer Maternal Aunt        68   Colon cancer Maternal Aunt 69   Diabetes Paternal Aunt    Breast cancer Other        PGM's sister; dx mid 11s   Colon cancer Other        PGM's brother; dx 34s   Ovarian cancer Other        PGM's mother; dx unknown age   Esophageal cancer Neg Hx    Rectal cancer Neg Hx    Stomach cancer Neg Hx  Colon polyps Neg Hx     Social History:  reports that she has quit smoking. Her smoking use included cigarettes. She has a 60.00 pack-year smoking history. She has never used smokeless tobacco. She reports that she does not currently use alcohol. She reports that she does not use drugs.  Allergies:  Allergies  Allergen Reactions   Lisinopril Cough    Medications:  Current Outpatient Medications  Medication Instructions   acetaminophen (TYLENOL) 1,000 mg, Oral, Every 6 hours PRN   amLODipine (NORVASC) 10 mg, Oral, Daily   anastrozole (ARIMIDEX) 1 mg, Oral, Daily   buPROPion (WELLBUTRIN SR) 150 mg, Oral, 2 times daily   docusate sodium (COLACE) 100 mg, Oral, Daily PRN   gabapentin (NEURONTIN) 100 mg, Oral, 2 times daily   gabapentin (NEURONTIN) 300 mg, Oral, Daily at bedtime   hydrALAZINE (APRESOLINE) 25 mg, Oral, 2 times daily   HYDROcodone-acetaminophen (NORCO/VICODIN) 5-325 MG tablet 1  tablet, Oral, Every 4 hours PRN   meloxicam (MOBIC) 15 mg, Oral, Daily   Menthol, Topical Analgesic, (BENGAY EX) 1 Application, Topical, Daily PRN   Multiple Vitamin (MULTIVITAMIN) TABS 1 tablet, Oral, Daily   pantoprazole (PROTONIX) 40 mg, Oral, Daily   Vitamin D 2,000 Units, Oral, Daily     No results found for this or any previous visit (from the past 48 hour(s)).  No results found.  Intake/Output    None      Review of Systems  Constitutional:  Negative for chills and fever.  Respiratory:  Negative for shortness of breath and wheezing.   Cardiovascular:  Negative for chest pain and palpitations.  Gastrointestinal:  Negative for nausea and vomiting.  Musculoskeletal:        Tammy knee/distal thigh pain   Neurological:  Negative for tingling and sensory change.   Height '5\' 2"'$  (1.575 m), weight 76.2 kg. Physical Exam Constitutional:      General: She is not in acute distress.    Appearance: Normal appearance. She is normal weight.  HENT:     Head: Normocephalic.  Eyes:     Extraocular Movements: Extraocular movements intact.  Cardiovascular:     Rate and Rhythm: Normal rate.  Pulmonary:     Effort: Pulmonary effort is normal.  Musculoskeletal:     Comments: Tammy Lower Extremity  TTP L distal femur  Motor and sensory functions intact distally  Ext warm  Surgical wounds well healed No pitting edema  Knee ROM 0-100 degrees + DP pulse No DCT Compartments are soft    CT L femur shows nonunion of L distal femur with broken distal femoral plate   Skin:    General: Skin is warm and dry.  Neurological:     General: No focal deficit present.     Mental Status: She is alert and oriented to person, place, and time.  Psychiatric:        Mood and Affect: Mood normal.        Behavior: Behavior normal.        Thought Content: Thought content normal.        Judgment: Judgment normal.     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  Will also plan for grafting   TDWB post op  Unrestricted ROM L knee post op   Metabolic bone workup but  likely culprit is aromatase inhibitor which pt needs to be on for breast CA   Will reach out to Heme/onc as pt would likely benefit from pharmacologic treatment to address potential aromatase inhibitor-induced bone loss   Lab workup: 25 OH vitamin d, hgb A1c, ESR, CRP, CMET, nicotine and metabolites    Admit post op for pain control, therapies  - Pain management:  Multimodal   - ABL anemia/Hemodynamics  Monitor   - Medical issues   Resume home meds as needed   - DVT/PE prophylaxis:  Lovenox post op   - Dispo:  OR for repair of Tammy distal femur nonunion      Jari Pigg, PA-C [phone] (C) 03/16/2022, 10:34 AM  [facility] [place] [address] [phone] Jenetta [phone] (F)    After 5pm and on the weekends please log on to Am[facility], go to orthopaedics and the look under the [facility] Call for the provider(s) on call. You can also call our office at [phone] and then follow the prompts to be connected to the call team.

## 2022-03-17 ENCOUNTER — Ambulatory Visit (HOSPITAL_COMMUNITY)
Admission: RE | Admit: 2022-03-17 | Discharge: 2022-03-17 | Disposition: A | Discharge status: Home / Self Care | Payer: Medicare Other | Attending: Orthopedic Surgery | Admitting: Orthopedic Surgery

## 2022-03-17 ENCOUNTER — Encounter (HOSPITAL_COMMUNITY): Payer: Self-pay | Admitting: Orthopedic Surgery

## 2022-03-17 ENCOUNTER — Inpatient Hospital Stay (HOSPITAL_COMMUNITY): Payer: Medicare Other

## 2022-03-17 ENCOUNTER — Other Ambulatory Visit: Payer: Self-pay

## 2022-03-17 ENCOUNTER — Encounter (HOSPITAL_COMMUNITY)
Admission: RE | Disposition: A | Discharge status: Home / Self Care | Payer: Self-pay | Source: Home / Self Care | Attending: Orthopedic Surgery

## 2022-03-17 DIAGNOSIS — Z87891 Personal history of nicotine dependence: Secondary | ICD-10-CM | POA: Insufficient documentation

## 2022-03-17 DIAGNOSIS — I119 Hypertensive heart disease without heart failure: Secondary | ICD-10-CM | POA: Insufficient documentation

## 2022-03-17 DIAGNOSIS — S72492K Other fracture of lower end of left femur, subsequent encounter for closed fracture with nonunion: Secondary | ICD-10-CM | POA: Insufficient documentation

## 2022-03-17 DIAGNOSIS — F319 Bipolar disorder, unspecified: Secondary | ICD-10-CM | POA: Insufficient documentation

## 2022-03-17 DIAGNOSIS — Z853 Personal history of malignant neoplasm of breast: Secondary | ICD-10-CM | POA: Diagnosis not present

## 2022-03-17 DIAGNOSIS — S72402K Unspecified fracture of lower end of left femur, subsequent encounter for closed fracture with nonunion: Secondary | ICD-10-CM | POA: Diagnosis not present

## 2022-03-17 DIAGNOSIS — K219 Gastro-esophageal reflux disease without esophagitis: Secondary | ICD-10-CM | POA: Diagnosis not present

## 2022-03-17 DIAGNOSIS — X58XXXD Exposure to other specified factors, subsequent encounter: Secondary | ICD-10-CM | POA: Diagnosis not present

## 2022-03-17 DIAGNOSIS — M8589 Other specified disorders of bone density and structure, multiple sites: Secondary | ICD-10-CM | POA: Insufficient documentation

## 2022-03-17 DIAGNOSIS — I824Y9 Acute embolism and thrombosis of unspecified deep veins of unspecified proximal lower extremity: Secondary | ICD-10-CM | POA: Diagnosis not present

## 2022-03-17 LAB — COMPREHENSIVE METABOLIC PANEL
ALT: 13 U/L (ref 0–44)
AST: 23 U/L (ref 15–41)
Albumin: 3.6 g/dL (ref 3.5–5.0)
Alkaline Phosphatase: 90 U/L (ref 38–126)
Anion gap: 11 (ref 5–15)
BUN: 14 mg/dL (ref 8–23)
CO2: 24 mmol/L (ref 22–32)
Calcium: 9.6 mg/dL (ref 8.9–10.3)
Chloride: 104 mmol/L (ref 98–111)
Creatinine, Ser: 0.81 mg/dL (ref 0.44–1.00)
GFR, Estimated: >60 mL/min (ref >60)
Glucose, Bld: 94 mg/dL (ref 70–99)
Potassium: 3.2 mmol/L — ABNORMAL LOW (ref 3.5–5.1)
Sodium: 139 mmol/L (ref 135–145)
Total Bilirubin: 0.2 mg/dL — ABNORMAL LOW (ref 0.3–1.2)
Total Protein: 7.4 g/dL (ref 6.5–8.1)

## 2022-03-17 LAB — SEDIMENTATION RATE: Sed Rate: 38 mm/hr — ABNORMAL HIGH (ref 0–22)

## 2022-03-17 LAB — CBC WITH DIFFERENTIAL/PLATELET
Abs Immature Granulocytes: 0 10*3/uL (ref 0.00–0.07)
Basophils Absolute: 0 10*3/uL (ref 0.0–0.1)
Basophils Relative: 0 %
Eosinophils Absolute: 0.1 10*3/uL (ref 0.0–0.5)
Eosinophils Relative: 2 %
HCT: 35 % — ABNORMAL LOW (ref 36.0–46.0)
Hemoglobin: 11.6 g/dL — ABNORMAL LOW (ref 12.0–15.0)
Immature Granulocytes: 0 %
Lymphocytes Relative: 27 %
Lymphs Abs: 1.2 10*3/uL (ref 0.7–4.0)
MCH: 29.6 pg (ref 26.0–34.0)
MCHC: 33.1 g/dL (ref 30.0–36.0)
MCV: 89.3 fL (ref 80.0–100.0)
Monocytes Absolute: 0.3 10*3/uL (ref 0.1–1.0)
Monocytes Relative: 7 %
Neutro Abs: 2.9 10*3/uL (ref 1.7–7.7)
Neutrophils Relative %: 64 %
Platelets: 247 10*3/uL (ref 150–400)
RBC: 3.92 MIL/uL (ref 3.87–5.11)
RDW: 15.9 % — ABNORMAL HIGH (ref 11.5–15.5)
WBC: 4.4 10*3/uL (ref 4.0–10.5)
nRBC: 0 % (ref 0.0–0.2)

## 2022-03-17 LAB — PROTIME-INR
INR: 1.1 (ref 0.8–1.2)
Prothrombin Time: 14 seconds (ref 11.4–15.2)

## 2022-03-17 LAB — C-REACTIVE PROTEIN: CRP: <0.5 mg/dL (ref <1.0)

## 2022-03-17 LAB — HEMOGLOBIN A1C
Hgb A1c MFr Bld: 5.6 % (ref 4.8–5.6)
Mean Plasma Glucose: 114.02 mg/dL

## 2022-03-17 SURGERY — OPEN REDUCTION INTERNAL FIXATION (ORIF) DISTAL FEMUR FRACTURE
Anesthesia: General | Laterality: Left

## 2022-03-17 MED ORDER — ORAL CARE MOUTH RINSE: 15.0000 mL | Freq: Once | OROMUCOSAL | Status: AC

## 2022-03-17 MED ORDER — LIDOCAINE 2% (20 MG/ML) 5 ML SYRINGE
INTRAMUSCULAR | Status: AC
  Filled 2022-03-17: qty 5

## 2022-03-17 MED ORDER — PROPOFOL 10 MG/ML IV BOLUS
INTRAVENOUS | Status: AC
  Filled 2022-03-17: qty 20

## 2022-03-17 MED ORDER — LACTATED RINGERS IV SOLN: INTRAVENOUS | Status: DC

## 2022-03-17 MED ORDER — CHLORHEXIDINE GLUCONATE 0.12 % MT SOLN
15.0000 mL | Freq: Once | OROMUCOSAL | Status: AC
  Administered 2022-03-17: 15 mL via OROMUCOSAL
  Filled 2022-03-17: qty 15

## 2022-03-17 MED ORDER — FENTANYL CITRATE (PF) 250 MCG/5ML IJ SOLN
INTRAMUSCULAR | Status: AC
  Filled 2022-03-17: qty 5

## 2022-03-17 MED ORDER — MIDAZOLAM HCL 2 MG/2ML IJ SOLN
INTRAMUSCULAR | Status: AC
  Filled 2022-03-17: qty 2

## 2022-03-17 MED ORDER — ONDANSETRON HCL 4 MG/2ML IJ SOLN
INTRAMUSCULAR | Status: AC
  Filled 2022-03-17: qty 2

## 2022-03-17 MED ORDER — DEXAMETHASONE SODIUM PHOSPHATE 10 MG/ML IJ SOLN
INTRAMUSCULAR | Status: AC
  Filled 2022-03-17: qty 1

## 2022-03-17 NOTE — Progress Notes (Signed)
Orthopedic Tech Progress Note Patient Details:  Tammy [name] 13-Aug-1955 [id number]  Ortho Devices Type of Ortho Device: Knee Immobilizer Ortho Device/Splint Location: LLE Ortho Device/Splint Interventions: Ordered, Application, Adjustment   Post Interventions Patient Tolerated: Well Instructions Provided: [facility] 03/17/2022, 11:25 AM

## 2022-03-17 NOTE — Progress Notes (Signed)
Per Dr. Marcelino Scot today's surgery is cancelled. PIV removed. Knee Immobilizer placed by ortho tech as ordered. Patient escorted to lobby via wheelchair.

## 2022-03-17 NOTE — Anesthesia Preprocedure Evaluation (Deleted)
Anesthesia Evaluation  Patient identified by MRN, date of birth, ID band Patient awake    Reviewed: Allergy & Precautions, NPO status , Patient's Chart, lab work & pertinent test results, reviewed documented beta blocker date and time   Airway Mallampati: II  TM Distance: >3 FB Neck ROM: Full    Dental  (+) Partial Lower, Partial Upper, Dental Advisory Given,    Pulmonary former smoker   Pulmonary exam normal breath sounds clear to auscultation       Cardiovascular hypertension, Pt. on medications Normal cardiovascular exam+ Valvular Problems/Murmurs  Rhythm:Regular Rate:Normal     Neuro/Psych  PSYCHIATRIC DISORDERS Anxiety Depression Bipolar Disorder   CVA, Residual Symptoms    GI/Hepatic Neg liver ROS,GERD  Medicated,,  Endo/Other  Obesity Hx/o left breast Ca S/P lumpectomy and RT  Renal/GU negative Renal ROS  negative genitourinary   Musculoskeletal  (+) Arthritis , Osteoarthritis,  Non union and broken hardware left distal femur Fx   Abdominal  (+) + obese  Peds  Hematology  (+) Blood dyscrasia, anemia   Anesthesia Other Findings   Reproductive/Obstetrics                              Anesthesia Physical Anesthesia Plan  ASA: 3  Anesthesia Plan: General   Post-op Pain Management: Dilaudid IV, Ofirmev IV (intra-op)*, Precedex and Ketamine IV*   Induction: Intravenous  PONV Risk Score and Plan: 4 or greater and Treatment may vary due to age or medical condition, Midazolam, Dexamethasone and Ondansetron  Airway Management Planned: Oral ETT and LMA  Additional Equipment: None  Intra-op Plan:   Post-operative Plan: Extubation in OR  Informed Consent: I have reviewed the patients History and Physical, chart, labs and discussed the procedure including the risks, benefits and alternatives for the proposed anesthesia with the patient or authorized representative who has indicated  his/her understanding and acceptance.     Dental advisory given  Plan Discussed with: Anesthesiologist, CRNA and Surgeon  Anesthesia Plan Comments:          Anesthesia Quick Evaluation

## 2022-03-17 NOTE — H&P (Addendum)
As noted. Case has been cancelled given clinical shortening evident today. She will instead require distal femoral replacement from my total joint colleague.      Orthopedic Trauma Service   Previous H&P note should show ORIF of L distal femur on 04/2021.  The remainder of the H&P is correct in terms of laterality of injury   Jari Pigg, PA-C [phone] (C) 03/17/2022, 10:30 AM  [facility] [place] [phone] (F)

## 2022-03-18 LAB — MISC LABCORP TEST (SEND OUT): Labcorp test code: 81950

## 2022-03-23 LAB — NICOTINE/COTININE METABOLITES
Cotinine: 20.4 ng/mL
Nicotine: 1.4 ng/mL

## 2022-04-06 ENCOUNTER — Other Ambulatory Visit: Payer: Self-pay | Admitting: Gastroenterology

## 2022-04-21 ENCOUNTER — Ambulatory Visit: Payer: Self-pay | Admitting: Student

## 2022-04-23 NOTE — Patient Instructions (Addendum)
SURGICAL WAITING ROOM VISITATION Patients having surgery or a procedure may have no more than 2 support people in the waiting area - these visitors may rotate in the visitor waiting room.   Due to an increase in RSV and influenza rates and associated hospitalizations, children ages 68 and under may not visit patients in [place]. If the patient needs to stay at the hospital during part of their recovery, the visitor guidelines for inpatient rooms apply.  PRE-OP VISITATION  Pre-op nurse will coordinate an appropriate time for 1 support person to accompany the patient in pre-op.  This support person may not rotate.  This visitor will be contacted when the time is appropriate for the visitor to come back in the pre-op area.  Please refer to the [facility] website for the visitor guidelines for Inpatients (after your surgery is over and you are in a regular room).  You are not required to quarantine at this time prior to your surgery. However, you must do this: Hand Hygiene often Do NOT share personal items Notify your provider if you are in close contact with someone who has COVID or you develop fever 100.4 or greater, new onset of sneezing, cough, sore throat, shortness of breath or body aches.  If you test positive for Covid or have been in contact with anyone that has tested positive in the last 10 days please notify you surgeon.    Your procedure is scheduled on:  Thursday  April 30, 2022  Report to [facility] Main Entrance: [place] entrance where the [employer] is available.   Report to admitting at: 05:15    AM  +++++Call this number if you have any questions or problems the morning of surgery [phone]  Do not eat food after Midnight the night prior to your surgery/procedure.  After Midnight you may have the following liquids until  04:30 AM  DAY OF SURGERY  Clear Liquid Diet Water Black Coffee (sugar ok, NO MILK/CREAM OR CREAMERS)  Tea (sugar ok, NO  MILK/CREAM OR CREAMERS) regular and decaf                             Plain Jell-O  with no fruit (NO RED)                                           Fruit ices (not with fruit pulp, NO RED)                                     Popsicles (NO RED)                                                                  Juice: apple, WHITE grape, WHITE cranberry Sports drinks like Gatorade or Powerade (NO RED)                   The day of surgery:  Drink ONE (1) Pre-Surgery Clear Ensure at  04:30 AM the morning of surgery. Drink in one sitting.  Do not sip.  This drink was given to you during your hospital pre-op appointment visit. Nothing else to drink after completing the Pre-Surgery Clear Ensure : No candy, chewing gum or throat lozenges.    FOLLOW  ANY ADDITIONAL PRE OP INSTRUCTIONS YOU RECEIVED FROM YOUR SURGEON'S OFFICE!!!   Oral Hygiene is also important to reduce your risk of infection.        Remember - BRUSH YOUR TEETH THE MORNING OF SURGERY WITH YOUR REGULAR TOOTHPASTE  Do NOT smoke after Midnight the night before surgery.  Take ONLY these medicines the morning of surgery with A SIP OF WATER: Pantoprazole (Protonix), amlodipine, anastrozole (Arimidex), hydralazine (Apresoline), Bupropion, gabapentin. You may take either Tylenol or Hydrocodone if needed for pain.                     You may not have any metal on your body including hair pins, jewelry, and body piercing  Do not wear make-up, lotions, powders, perfumes or deodorant  Do not wear nail polish including gel and S&S, artificial / acrylic nails, or any other type of covering on natural nails including finger and toenails. If you have artificial nails, gel coating, etc., that needs to be removed by a nail salon, Please have this removed prior to surgery. Not doing so may mean that your surgery could be cancelled or delayed if the Surgeon or anesthesia staff feels like they are unable to monitor you safely.   Do not shave 48 hours  prior to surgery to avoid nicks in your skin which may contribute to postoperative infections.   Contacts, Hearing Aids, dentures or bridgework may not be worn into surgery. DENTURES WILL BE REMOVED PRIOR TO SURGERY PLEASE DO NOT APPLY "Poly grip" OR ADHESIVES!!!  You may bring a small overnight bag with you on the day of surgery, only pack items that are not valuable. [place] IS NOT RESPONSIBLE   FOR VALUABLES THAT ARE LOST OR STOLEN.   Do not bring your home medications to the hospital. The Pharmacy will dispense medications listed on your medication list to you during your admission in the Hospital.  Special Instructions: Bring a copy of your healthcare power of attorney and living will documents the day of surgery, if you wish to have them scanned into your [facility] Records- EPIC  Please read over the following fact sheets you were given: IF YOU HAVE QUESTIONS ABOUT YOUR [place], [place] [phone].   [facility] - Preparing for Surgery Before surgery, you can play an important role.  Because skin is not sterile, your skin needs to be as free of germs as possible.  You can reduce the number of germs on your skin by washing with CHG (chlorahexidine gluconate) soap before surgery.  CHG is an antiseptic cleaner which kills germs and bonds with the skin to continue killing germs even after washing. Please DO NOT use if you have an allergy to CHG or antibacterial soaps.  If your skin becomes reddened/irritated stop using the CHG and inform your nurse when you arrive at Short Stay. Do not shave (including legs and underarms) for at least 48 hours prior to the first CHG shower.  You may shave your face/neck.  Please follow these instructions carefully:  1.  Shower with CHG Soap the night before surgery and the  morning of surgery.  2.  If you choose to wash your hair, wash your hair first as usual with your normal  shampoo.  3.  After you shampoo, rinse your hair and  body thoroughly to remove the shampoo.                             4.  Use CHG as you would any other liquid soap.  You can apply chg directly to the skin and wash.  Gently with a scrungie or clean washcloth.  5.  Apply the CHG Soap to your body ONLY FROM THE NECK DOWN.   Do not use on face/ open                           Wound or open sores. Avoid contact with eyes, ears mouth and genitals (private parts).                       Wash face,  Genitals (private parts) with your normal soap.             6.  Wash thoroughly, paying special attention to the area where your  surgery  will be performed.  7.  Thoroughly rinse your body with warm water from the neck down.  8.  DO NOT shower/wash with your normal soap after using and rinsing off the CHG Soap.            9.  Pat yourself dry with a clean towel.            10.  Wear clean pajamas.            11.  Place clean sheets on your bed the night of your first shower and do not  sleep with pets.  ON THE DAY OF SURGERY : Do not apply any lotions/deodorants the morning of surgery.  Please wear clean clothes to the hospital/surgery center.    FAILURE TO FOLLOW THESE INSTRUCTIONS MAY RESULT IN THE CANCELLATION OF YOUR SURGERY  PATIENT SIGNATURE_________________________________  NURSE SIGNATURE__________________________________  ________________________________________________________________________       Rogelia Mire    An incentive spirometer is a tool that can help keep your lungs clear and active. This tool measures how well you are filling your lungs with each breath. Taking long deep breaths may help reverse or decrease the chance of developing breathing (pulmonary) problems (especially infection) following: A long period of time when you are unable to move or be active. BEFORE THE PROCEDURE  If the spirometer includes an indicator to show your best effort, your nurse or respiratory therapist will set it to a desired goal. If  possible, sit up straight or lean slightly forward. Try not to slouch. Hold the incentive spirometer in an upright position. INSTRUCTIONS FOR USE  Sit on the edge of your bed if possible, or sit up as far as you can in bed or on a chair. Hold the incentive spirometer in an upright position. Breathe out normally. Place the mouthpiece in your mouth and seal your lips tightly around it. Breathe in slowly and as deeply as possible, raising the piston or the ball toward the top of the column. Hold your breath for 3-5 seconds or for as long as possible. Allow the piston or ball to fall to the bottom of the column. Remove the mouthpiece from your mouth and breathe out normally. Rest for a few seconds and repeat Steps 1 through 7 at least 10 times every 1-2 hours when you are awake. Take your time  and take a few normal breaths between deep breaths. The spirometer may include an indicator to show your best effort. Use the indicator as a goal to work toward during each repetition. After each set of 10 deep breaths, practice coughing to be sure your lungs are clear. If you have an incision (the cut made at the time of surgery), support your incision when coughing by placing a pillow or rolled up towels firmly against it. Once you are able to get out of bed, walk around indoors and cough well. You may stop using the incentive spirometer when instructed by your caregiver.  RISKS AND COMPLICATIONS Take your time so you do not get dizzy or light-headed. If you are in pain, you may need to take or ask for pain medication before doing incentive spirometry. It is harder to take a deep breath if you are having pain. AFTER USE Rest and breathe slowly and easily. It can be helpful to keep track of a log of your progress. Your caregiver can provide you with a simple table to help with this. If you are using the spirometer at home, follow these instructions: [place] IF:  You are having difficultly using the  spirometer. You have trouble using the spirometer as often as instructed. Your pain medication is not giving enough relief while using the spirometer. You develop fever of 100.5 F (38.1 C) or higher.                                                                                                    SEEK IMMEDIATE MEDICAL CARE IF:  You cough up bloody sputum that had not been present before. You develop fever of 102 F (38.9 C) or greater. You develop worsening pain at or near the incision site. MAKE SURE YOU:  Understand these instructions. Will watch your condition. Will get help right away if you are not doing well or get worse. Document Released: 06/08/2006 Document Revised: 04/20/2011 Document Reviewed: 08/09/2006 [employer] Patient Information 2014 [place], [place].

## 2022-04-23 NOTE — Progress Notes (Addendum)
COVID Vaccine received:  []  No [x]  Yes Date of any COVID positive Test in last 90 days:  None  PCP - [name], MD at [place] Cardiologist - none Neurology- Sarina Ill, MD Liver Transplant [facility] at  [facility], NP  [phone]   Chest x-ray -  EKG -  03-17-2022  epic Stress Test -  ECHO - 10-05-2012 epic Cardiac Cath -   PCR screen: []  Ordered & Completed                      []   No Order but Needs PROFEND                      [x]   N/A for this surgery  Surgery Plan:  []  Ambulatory                            [x]  Outpatient in bed                            []  Admit  Anesthesia:    []  General  [x]  Spinal                           []   Choice []   MAC  Pacemaker / ICD device [x]  No []  Yes        Device order form faxed [x]  No    []   Yes      Faxed to:  Spinal Cord Stimulator:[x]  No []  Yes      (Remind patient to bring remote DOS) Other Implants:   History of Sleep Apnea? [x]  No []  Yes   CPAP used?- [x]  No []  Yes    Does the patient monitor blood sugar? []  No []  Yes  [x]  N/A  Blood Thinner / Instructions: none Aspirin Instructions:  none  ERAS Protocol Ordered: []  No  [x]  Yes PRE-SURGERY [x]  ENSURE  []  G2  Patient is to be NPO after:  04:30  am  Activity level: Patient is unable to climb a flight of stairs without difficulty she is nonambulatory. Patient can perform ADLs without assistance.   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 Charcot-Marie-Tooth Disease  Patient denies shortness of breath, fever, cough and chest pain at PAT appointment.  Patient verbalized understanding and agreement to the Pre-Surgical Instructions that were given to them at this PAT appointment. Patient was also educated of the need to review these PAT instructions again prior to his/her  surgery.I reviewed the appropriate phone numbers to call if they have any and questions or concerns.

## 2022-04-24 ENCOUNTER — Encounter (HOSPITAL_COMMUNITY): Payer: Self-pay

## 2022-04-24 ENCOUNTER — Encounter (HOSPITAL_COMMUNITY)
Admission: RE | Admit: 2022-04-24 | Discharge: 2022-04-24 | Disposition: A | Discharge status: Home / Self Care | Payer: Medicare Other | Source: Ambulatory Visit | Attending: Orthopedic Surgery | Admitting: Orthopedic Surgery

## 2022-04-24 ENCOUNTER — Other Ambulatory Visit: Payer: Self-pay

## 2022-04-24 VITALS — BP 152/70 | HR 60 | Temp 98.2°F | Resp 16 | Ht 62.0 in | Wt 168.0 lb

## 2022-04-24 DIAGNOSIS — Z01812 Encounter for preprocedural laboratory examination: Secondary | ICD-10-CM | POA: Diagnosis present

## 2022-04-24 DIAGNOSIS — I10 Essential (primary) hypertension: Secondary | ICD-10-CM | POA: Diagnosis not present

## 2022-04-24 DIAGNOSIS — K769 Liver disease, unspecified: Secondary | ICD-10-CM | POA: Diagnosis not present

## 2022-04-24 HISTORY — DX: Unspecified cirrhosis of liver: K74.60

## 2022-04-24 HISTORY — DX: Myoneural disorder, unspecified: G70.9

## 2022-04-24 LAB — CBC
HCT: 35.1 % — ABNORMAL LOW (ref 36.0–46.0)
Hemoglobin: 10.7 g/dL — ABNORMAL LOW (ref 12.0–15.0)
MCH: 28.2 pg (ref 26.0–34.0)
MCHC: 30.5 g/dL (ref 30.0–36.0)
MCV: 92.6 fL (ref 80.0–100.0)
Platelets: 263 10*3/uL (ref 150–400)
RBC: 3.79 MIL/uL — ABNORMAL LOW (ref 3.87–5.11)
RDW: 14.7 % (ref 11.5–15.5)
WBC: 5.5 10*3/uL (ref 4.0–10.5)
nRBC: 0 % (ref 0.0–0.2)

## 2022-04-24 LAB — COMPREHENSIVE METABOLIC PANEL
ALT: 17 U/L (ref 0–44)
AST: 19 U/L (ref 15–41)
Albumin: 4 g/dL (ref 3.5–5.0)
Alkaline Phosphatase: 85 U/L (ref 38–126)
Anion gap: 7 (ref 5–15)
BUN: 18 mg/dL (ref 8–23)
CO2: 27 mmol/L (ref 22–32)
Calcium: 9.2 mg/dL (ref 8.9–10.3)
Chloride: 104 mmol/L (ref 98–111)
Creatinine, Ser: 0.72 mg/dL (ref 0.44–1.00)
GFR, Estimated: >60 mL/min (ref >60)
Glucose, Bld: 108 mg/dL — ABNORMAL HIGH (ref 70–99)
Potassium: 4.4 mmol/L (ref 3.5–5.1)
Sodium: 138 mmol/L (ref 135–145)
Total Bilirubin: 0.4 mg/dL (ref 0.3–1.2)
Total Protein: 7.4 g/dL (ref 6.5–8.1)

## 2022-04-24 NOTE — Anesthesia Preprocedure Evaluation (Signed)
Anesthesia Evaluation  Patient identified by MRN, date of birth, ID band Patient awake    Reviewed: Allergy & Precautions, NPO status , Patient's Chart, lab work & pertinent test results  Airway Mallampati: II  TM Distance: >3 FB Neck ROM: Full    Dental  (+) Missing, Dental Advisory Given,    Pulmonary Patient abstained from smoking., former smoker   Pulmonary exam normal breath sounds clear to auscultation       Cardiovascular hypertension, Pt. on medications + Valvular Problems/Murmurs  Rhythm:Regular Rate:Normal + Systolic murmurs    Neuro/Psych  PSYCHIATRIC DISORDERS Anxiety Depression Bipolar Disorder   CVA, No Residual Symptoms    GI/Hepatic Neg liver ROS,GERD  ,,  Endo/Other  Lab Results      Component                Value               Date                      CREATININE               0.74                04/08/2021                 K                        3.6                 04/08/2021                Renal/GU negative Renal ROS     Musculoskeletal  (+) Arthritis ,    Abdominal  (+) + obese (BMI 31.28)  Peds  Hematology  (+) Blood dyscrasia, anemia Lab Results      Component                Value               Date                      WBC                      10.6 (H)            04/08/2021                HGB                      10.9 (L)            04/08/2021                HCT                      33.9 (L)            04/08/2021                PLT                      208                 04/08/2021              Anesthesia Other Findings L breast CA  [id number]  Reproductive/Obstetrics  Anesthesia Physical Anesthesia Plan  ASA: 3  Anesthesia Plan: General   Post-op Pain Management: Tylenol PO (pre-op)*, Regional block* and Gabapentin PO (pre-op)*   Induction: Intravenous  PONV Risk Score and Plan: 3 and Midazolam, Ondansetron, Treatment  may vary due to age or medical condition and Dexamethasone  Airway Management Planned: Oral ETT  Additional Equipment: None  Intra-op Plan:   Post-operative Plan: Extubation in OR  Informed Consent: I have reviewed the patients History and Physical, chart, labs and discussed the procedure including the risks, benefits and alternatives for the proposed anesthesia with the patient or authorized representative who has indicated his/her understanding and acceptance.     Dental advisory given  Plan Discussed with: CRNA  Anesthesia Plan Comments: (2 x PIV  See APP note by Joslyn Hy, FNP  )        Anesthesia Quick Evaluation

## 2022-04-24 NOTE — Progress Notes (Signed)
Left a voicemail message that Tammy [name] has been cleared for surgery as planned. I also told her to followup with her PCP about this murmur and the need to get an ECHO in the near future.

## 2022-04-24 NOTE — Progress Notes (Addendum)
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 HRRR; pt does have a 2/6 systolic heart murmur. Pt denies, chest pain, fatigue, SOB, dizziness.   Reviewed heart murmur status with Dr. Smith Robert. Pt can proceed as scheduled but should have murmur evaluated in the  near future  VS: BP (!) 152/70   Pulse 60   Temp 36.8 C (Oral)   Resp 16   Ht 5\' 2"  (1.575 m)   Wt 76.2 kg   SpO2 100%   BMI 30.73 kg/m   PROVIDERS: - PCP is [name], MD - Hepatology care at [facility] (notes in care everywhere). Last office visit 01/05/22 with Roosevelt Locks, NP   LABS: Labs reviewed: Acceptable for surgery. (all labs ordered are listed, but only abnormal results are displayed)  Labs Reviewed  COMPREHENSIVE METABOLIC PANEL - Abnormal; Notable for the following components:      Result Value   Glucose, Bld 108 (*)    All other components within normal limits  CBC - Abnormal; Notable for the following components:   RBC 3.79 (*)    Hemoglobin 10.7 (*)    HCT 35.1 (*)    All other components within normal limits     IMAGES: [place] abd 01/14/22:  1. Cholelithiasis without secondary signs of acute cholecystitis. 2. Heterogeneous echogenicity and mildly nodular contour of the liver. No focal lesion.   EKG 03/17/22: NSR. Possible Left atrial enlargement   CV: Echo 2014  Carotid US 10/05/12:  - Right - No evidence of ICA stenosis. Vertebral artery flow is antegrade.  - Left - 1% to 39% ICA stenosis lower end of scale.  Vertebral artery flow is antegrade.    Past Medical History:  Diagnosis Date   Anemia    hx of   Anxiety    on meds   Arthritis    back/bilateral ankles   Bipolar disorder ([place])    Breast cancer ([place]) 2022   LEFT breast-radiation   Depression    on meds   Family history of breast cancer 07/10/2020   Family history of colon cancer 07/10/2020   Family history of ovarian cancer 07/10/2020   GERD (gastroesophageal reflux disease)    on meds   Heart murmur    dx by PCP   HTN (hypertension)    on meds   Hx of radiation therapy    Liver cirrhosis ([place])    Neuromuscular disorder ([place])    Osteopenia    Stroke ([place]) 09/2012   slurred speech and LEFT sided weakness/notes 10/04/2012    Past Surgical History:  Procedure Laterality Date   ANKLE FRACTURE SURGERY Left 2018   BREAST LUMPECTOMY WITH RADIOACTIVE SEED AND SENTINEL LYMPH NODE BIOPSY Left 07/26/2020   Procedure: LEFT BREAST LUMPECTOMY WITH RADIOACTIVE SEED AND SENTINEL LYMPH NODE BIOPSY;  Surgeon: [name], MD;  Location: [facility];  Service: General;  Laterality: Left;   [place]  COLONOSCOPY     DIAGNOSTIC LAPAROSCOPY  1985   ORIF FEMUR FRACTURE Left 04/10/2021   Procedure: OPEN REDUCTION INTERNAL FIXATION (ORIF) DISTAL FEMUR FRACTURE;  Surgeon: Altamese [place], MD;  Location: [facility];  Service: Orthopedics;  Laterality: Left;    MEDICATIONS:  acetaminophen (TYLENOL) 500 MG tablet   amLODipine (NORVASC) 10 MG tablet   anastrozole (ARIMIDEX) 1 MG tablet   buPROPion (WELLBUTRIN SR) 150 MG 12 hr tablet   CALCIUM PO   Cholecalciferol (VITAMIN D) 50 MCG (2000 UT) CAPS   docusate sodium (COLACE) 100 MG capsule   gabapentin (NEURONTIN) 100 MG capsule   gabapentin (NEURONTIN) 300 MG capsule   hydrALAZINE (APRESOLINE) 25 MG tablet   HYDROcodone-acetaminophen (NORCO) 10-325 MG tablet   meloxicam (MOBIC) 15 MG tablet   Menthol, Topical Analgesic, (BENGAY EX)   Multiple Vitamin  (MULTIVITAMIN) TABS   pantoprazole (PROTONIX) 40 MG tablet   potassium chloride (KLOR-CON) 10 MEQ tablet   No current facility-administered medications for this encounter.    If no changes, I anticipate pt can proceed with surgery as scheduled.   Willeen Cass, PhD, FNP-BC [facility] Short Stay Surgical Center/Anesthesiology Phone: [phone] 04/24/2022 10:26 AM

## 2022-04-29 ENCOUNTER — Ambulatory Visit: Payer: Self-pay | Admitting: Student

## 2022-04-29 NOTE — H&P (View-Only) (Signed)
PREOPERATIVE H&P  Chief Complaint: left knee pain  HPI: Tammy [name] is a 67 y.o. 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 She is 11 months s/p left distal femur ORIF fracture 04/10/21 with Dr. Marcelino Scot. She has been unable to weightbear in the left leg. 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 Symptoms are rated as moderate to severe, and have been worsening.  This is significantly impairing activities of daily living.  She has elected for surgical management with hardware removal and distal femur replacement.     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   Neuromuscular disorder ([place])    Osteopenia    Stroke ([place]) 09/2012   slurred speech and LEFT sided weakness/notes 10/04/2012   Past Surgical History:  Procedure Laterality Date   ANKLE FRACTURE SURGERY Left 2018   BREAST LUMPECTOMY WITH RADIOACTIVE SEED AND SENTINEL LYMPH NODE BIOPSY Left 07/26/2020   Procedure: LEFT BREAST LUMPECTOMY WITH RADIOACTIVE SEED AND SENTINEL LYMPH NODE BIOPSY;  Surgeon: [name], MD;  Location: [facility];  Service: General;  Laterality: Left;   [facility]   ORIF FEMUR  FRACTURE Left 04/10/2021   Procedure: OPEN REDUCTION INTERNAL FIXATION (ORIF) DISTAL FEMUR FRACTURE;  Surgeon: Altamese Peetz, MD;  Location: [facility];  Service: Orthopedics;  Laterality: Left;   Social History   Socioeconomic History   Marital status: Significant Other    Spouse name: Not on file   Number of children: 2   Years of education: Not on file   Highest education level: Not on file  Occupational History   Occupation: diabled  Tobacco Use   Smoking status: Former    Packs/day: 1.50    Years: 40.00    Additional pack years: 0.00    Total pack years: 60.00    Types: Cigarettes   Smokeless tobacco: Never   Tobacco comments:    has used e-cigarettes, no vaping   Vaping Use   Vaping Use: Never used  Substance and Sexual Activity   Alcohol use: Not Currently   Drug use: No   Sexual activity: Not Currently  Other Topics Concern   Not on file  Social History Narrative   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   Caffeine: 1 cup in the morning    Social Determinants of Health   Financial Resource Strain: Not on file  Food Insecurity: Not on file  Transportation Needs: Not on file  Physical Activity: Not  on file  Stress: Not on file  Social Connections: Not on file   Family History  Problem Relation Age of Onset   Other Mother        blood clots   Diabetes Sister    Neuropathy Sister        diabetic neuropathy   Diabetes Brother    Neuropathy Brother        diabetic neuropathy   Breast cancer Maternal Aunt        50   Colon cancer Maternal Aunt 69   Diabetes Paternal Aunt    Breast cancer Other        PGM's sister; dx mid 3s   Colon cancer Other        PGM's brother; dx 81s   Ovarian cancer Other        PGM's mother; dx unknown age   Esophageal cancer Neg Hx    Rectal cancer Neg Hx    Stomach cancer Neg Hx    Colon polyps Neg Hx    Allergies  Allergen Reactions   Lisinopril Cough   Prior to Admission medications   Medication Sig  Start Date End Date Taking? Authorizing Provider  acetaminophen (TYLENOL) 500 MG tablet Take 1,000 mg by mouth every 6 (six) hours as needed for moderate pain.    [provider]  amLODipine (NORVASC) 10 MG tablet Take 1 tablet (10 mg total) by mouth daily. 04/22/21   Setzer, Edman Circle, PA-C  anastrozole (ARIMIDEX) 1 MG tablet TAKE 1 TABLET BY MOUTH DAILY 03/16/22   Gardenia Phlegm, NP  buPROPion [facility] SR) 150 MG 12 hr tablet Take 150 mg by mouth 2 (two) times daily. 10/04/21   [provider]  CALCIUM PO Take 1 tablet by mouth daily.    [provider]  Cholecalciferol (VITAMIN D) 50 MCG (2000 UT) CAPS Take 2,000 Units by mouth daily.    [provider]  docusate sodium (COLACE) 100 MG capsule Take 100 mg by mouth daily as needed for moderate constipation.    [provider]  gabapentin (NEURONTIN) 100 MG capsule Take 1 capsule (100 mg total) by mouth 2 (two) times daily. Patient taking differently: Take 100 mg by mouth in the morning. 04/22/21   Setzer, Edman Circle, PA-C  gabapentin (NEURONTIN) 300 MG capsule Take 300 mg by mouth at bedtime. 06/24/21   [provider]  hydrALAZINE (APRESOLINE) 25 MG tablet Take 1 tablet (25 mg total) by mouth 2 (two) times daily. 04/22/21   Setzer, Edman Circle, PA-C  HYDROcodone-acetaminophen (NORCO) 10-325 MG tablet Take 1 tablet by mouth every 4 (four) hours as needed for moderate pain. 08/11/21   [provider]  meloxicam (MOBIC) 15 MG tablet Take 15 mg by mouth daily. 07/22/21   [provider]  Menthol, Topical Analgesic, (BENGAY EX) Apply 1 Application topically daily as needed (pain).    [provider]  Multiple Vitamin (MULTIVITAMIN) TABS Take 1 tablet by mouth daily. 04/22/21   Setzer, Edman Circle, PA-C  pantoprazole (PROTONIX) 40 MG tablet Take 1 tablet (40 mg total) by mouth daily. 04/22/21   Setzer, Edman Circle, PA-C  potassium chloride (KLOR-CON) 10 MEQ tablet Take 10 mEq by  mouth daily. 04/07/22   [provider]     Positive ROS: All other systems have been reviewed and were otherwise negative with the exception of those mentioned in the HPI and as above.  Physical Exam: General: Alert, no acute distress Cardiovascular: No pedal edema  Respiratory: No cyanosis, no use of accessory musculature GI: No organomegaly, abdomen is soft and non-tender Skin: No lesions in the area of chief complaint Neurologic: Sensation intact distally Psychiatric: Patient is competent for consent with normal mood and affect Lymphatic: No axillary or cervical lymphadenopathy  MUSCULOSKELETAL: Examination of the left lower extremity reveals healed incision over the distal lateral femur and healed stab incisions over the mid aspect of the femur. Her femur is grossly short when compared to the contralateral side. There is no effusion or erythema. She has pain with attempted range of motion of the knee.  Distally, there is no focal motor or sensory deficit. She is palpable pedal pulses.  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 Failed hardware left femur.  Plan: Plan for left femur hardware removal and distal femur replacement. Patient discussed with Dr. Lyla Glassing that there are risks associated with the surgery including, but not limited to, problems with anesthesia, (death), infection, dislocation, instability (giving out of the joint), differences in leg length/angulation/rotation, fractures of bones, loosening or failure of implants, hematoma (blood accumulation in/near the hip joint) which may require surgical drainage, blood clots, pulmonary embolism, nerve injury (lateral thigh numbness and foot drop), and blood vessel injury. They were willing to proceed. Patient is being admitted for inpatient treatment for surgery, pain control, PT, OT, prophylactic antibiotics, VTE prophylaxis, progressive ambulation and ADL's and discharge planning.The patient  is planning to be discharged home after a 2-3 night stay with OPPT.   Therapy Plans: outpatient therapy. [place] printed copy given to patient  Disposition: Home with Celesta Gentile Planned DVT Prophylaxis: aspirin 81mg  BID DME needed: Has rolling walker.  PCP: Cleared. Hepatology: Cleared. Recommends aspirin vs eliquis for DVT ppx due to increased risk of thrombocytopenia. TXA: IV Allergies:  - Lisinopril - cough Anesthesia Concerns: None.  BMI: 30.7 Last HgbA1c: Not diabetic.  Other: - Patient quit smoking 2 weeks ago. We discussed importance of not smoking for procedure due to increased risk of infection and healing complications. Patient understands and is in agreement to this plan.  - Stroke ~2017, some left sided weakness.  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 We discussed that she is at increased risk of chronic pain syndrome postoperatively due to chronic narcotic use.  - 04/24/22: Hgb 10.7, Cr. 0.72, K+ 4.4.   Charlott Rakes, PA-C [phone]   04/29/2022 8:17 PM

## 2022-04-29 NOTE — H&P (Signed)
PREOPERATIVE H&P  Chief Complaint: left knee pain  HPI: Tammy [name] is a 67 y.o. 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 She is 11 months s/p left distal femur ORIF fracture 04/10/21 with Dr. Marcelino Scot. She has been unable to weightbear in the left leg. 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 Symptoms are rated as moderate to severe, and have been worsening.  This is significantly impairing activities of daily living.  She has elected for surgical management with hardware removal and distal femur replacement.     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   Neuromuscular disorder ([place])    Osteopenia    Stroke ([place]) 09/2012   slurred speech and LEFT sided weakness/notes 10/04/2012   Past Surgical History:  Procedure Laterality Date   ANKLE FRACTURE SURGERY Left 2018   BREAST LUMPECTOMY WITH RADIOACTIVE SEED AND SENTINEL LYMPH NODE BIOPSY Left 07/26/2020   Procedure: LEFT BREAST LUMPECTOMY WITH RADIOACTIVE SEED AND SENTINEL LYMPH NODE BIOPSY;  Surgeon: [name], MD;  Location: [facility];  Service: General;  Laterality: Left;   [facility]   ORIF FEMUR  FRACTURE Left 04/10/2021   Procedure: OPEN REDUCTION INTERNAL FIXATION (ORIF) DISTAL FEMUR FRACTURE;  Surgeon: Altamese Prichard, MD;  Location: [facility];  Service: Orthopedics;  Laterality: Left;   Social History   Socioeconomic History   Marital status: Significant Other    Spouse name: Not on file   Number of children: 2   Years of education: Not on file   Highest education level: Not on file  Occupational History   Occupation: diabled  Tobacco Use   Smoking status: Former    Packs/day: 1.50    Years: 40.00    Additional pack years: 0.00    Total pack years: 60.00    Types: Cigarettes   Smokeless tobacco: Never   Tobacco comments:    has used e-cigarettes, no vaping   Vaping Use   Vaping Use: Never used  Substance and Sexual Activity   Alcohol use: Not Currently   Drug use: No   Sexual activity: Not Currently  Other Topics Concern   Not on file  Social History Narrative   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   Caffeine: 1 cup in the morning    Social Determinants of Health   Financial Resource Strain: Not on file  Food Insecurity: Not on file  Transportation Needs: Not on file  Physical Activity: Not  on file  Stress: Not on file  Social Connections: Not on file   Family History  Problem Relation Age of Onset   Other Mother        blood clots   Diabetes Sister    Neuropathy Sister        diabetic neuropathy   Diabetes Brother    Neuropathy Brother        diabetic neuropathy   Breast cancer Maternal Aunt        50   Colon cancer Maternal Aunt 69   Diabetes Paternal Aunt    Breast cancer Other        PGM's sister; dx mid 31s   Colon cancer Other        PGM's brother; dx 72s   Ovarian cancer Other        PGM's mother; dx unknown age   Esophageal cancer Neg Hx    Rectal cancer Neg Hx    Stomach cancer Neg Hx    Colon polyps Neg Hx    Allergies  Allergen Reactions   Lisinopril Cough   Prior to Admission medications   Medication Sig  Start Date End Date Taking? Authorizing Provider  acetaminophen (TYLENOL) 500 MG tablet Take 1,000 mg by mouth every 6 (six) hours as needed for moderate pain.    [provider]  amLODipine (NORVASC) 10 MG tablet Take 1 tablet (10 mg total) by mouth daily. 04/22/21   Setzer, Edman Circle, PA-C  anastrozole (ARIMIDEX) 1 MG tablet TAKE 1 TABLET BY MOUTH DAILY 03/16/22   Gardenia Phlegm, NP  buPROPion [facility] SR) 150 MG 12 hr tablet Take 150 mg by mouth 2 (two) times daily. 10/04/21   [provider]  CALCIUM PO Take 1 tablet by mouth daily.    [provider]  Cholecalciferol (VITAMIN D) 50 MCG (2000 UT) CAPS Take 2,000 Units by mouth daily.    [provider]  docusate sodium (COLACE) 100 MG capsule Take 100 mg by mouth daily as needed for moderate constipation.    [provider]  gabapentin (NEURONTIN) 100 MG capsule Take 1 capsule (100 mg total) by mouth 2 (two) times daily. Patient taking differently: Take 100 mg by mouth in the morning. 04/22/21   Setzer, Edman Circle, PA-C  gabapentin (NEURONTIN) 300 MG capsule Take 300 mg by mouth at bedtime. 06/24/21   [provider]  hydrALAZINE (APRESOLINE) 25 MG tablet Take 1 tablet (25 mg total) by mouth 2 (two) times daily. 04/22/21   Setzer, Edman Circle, PA-C  HYDROcodone-acetaminophen (NORCO) 10-325 MG tablet Take 1 tablet by mouth every 4 (four) hours as needed for moderate pain. 08/11/21   [provider]  meloxicam (MOBIC) 15 MG tablet Take 15 mg by mouth daily. 07/22/21   [provider]  Menthol, Topical Analgesic, (BENGAY EX) Apply 1 Application topically daily as needed (pain).    [provider]  Multiple Vitamin (MULTIVITAMIN) TABS Take 1 tablet by mouth daily. 04/22/21   Setzer, Edman Circle, PA-C  pantoprazole (PROTONIX) 40 MG tablet Take 1 tablet (40 mg total) by mouth daily. 04/22/21   Setzer, Edman Circle, PA-C  potassium chloride (KLOR-CON) 10 MEQ tablet Take 10 mEq by  mouth daily. 04/07/22   [provider]     Positive ROS: All other systems have been reviewed and were otherwise negative with the exception of those mentioned in the HPI and as above.  Physical Exam: General: Alert, no acute distress Cardiovascular: No pedal edema  Respiratory: No cyanosis, no use of accessory musculature GI: No organomegaly, abdomen is soft and non-tender Skin: No lesions in the area of chief complaint Neurologic: Sensation intact distally Psychiatric: Patient is competent for consent with normal mood and affect Lymphatic: No axillary or cervical lymphadenopathy  MUSCULOSKELETAL: Examination of the left lower extremity reveals healed incision over the distal lateral femur and healed stab incisions over the mid aspect of the femur. Her femur is grossly short when compared to the contralateral side. There is no effusion or erythema. She has pain with attempted range of motion of the knee.  Distally, there is no focal motor or sensory deficit. She is palpable pedal pulses.  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 Failed hardware left femur.  Plan: Plan for left femur hardware removal and distal femur replacement. Patient discussed with Dr. Lyla Glassing that there are risks associated with the surgery including, but not limited to, problems with anesthesia, (death), infection, dislocation, instability (giving out of the joint), differences in leg length/angulation/rotation, fractures of bones, loosening or failure of implants, hematoma (blood accumulation in/near the hip joint) which may require surgical drainage, blood clots, pulmonary embolism, nerve injury (lateral thigh numbness and foot drop), and blood vessel injury. They were willing to proceed. Patient is being admitted for inpatient treatment for surgery, pain control, PT, OT, prophylactic antibiotics, VTE prophylaxis, progressive ambulation and ADL's and discharge planning.The patient  is planning to be discharged home after a 2-3 night stay with OPPT.   Therapy Plans: outpatient therapy. [place] printed copy given to patient  Disposition: Home with Celesta Gentile Planned DVT Prophylaxis: aspirin 81mg  BID DME needed: Has rolling walker.  PCP: Cleared. Hepatology: Cleared. Recommends aspirin vs eliquis for DVT ppx due to increased risk of thrombocytopenia. TXA: IV Allergies:  - Lisinopril - cough Anesthesia Concerns: None.  BMI: 30.7 Last HgbA1c: Not diabetic.  Other: - Patient quit smoking 2 weeks ago. We discussed importance of not smoking for procedure due to increased risk of infection and healing complications. Patient understands and is in agreement to this plan.  - Stroke ~2017, some left sided weakness.  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 We discussed that she is at increased risk of chronic pain syndrome postoperatively due to chronic narcotic use.  - 04/24/22: Hgb 10.7, Cr. 0.72, K+ 4.4.   Charlott Rakes, PA-C [phone]   04/29/2022 8:17 PM

## 2022-04-30 ENCOUNTER — Inpatient Hospital Stay (HOSPITAL_COMMUNITY): Payer: Medicare Other | Admitting: Emergency Medicine

## 2022-04-30 ENCOUNTER — Encounter (HOSPITAL_COMMUNITY): Payer: Self-pay | Admitting: Orthopedic Surgery

## 2022-04-30 ENCOUNTER — Inpatient Hospital Stay (HOSPITAL_COMMUNITY)
Admission: RE | Admit: 2022-04-30 | Discharge: 2022-05-05 | DRG: 470 | Disposition: A | Discharge status: Home / Self Care | Payer: Medicare Other | Source: Ambulatory Visit | Attending: Orthopedic Surgery | Admitting: Orthopedic Surgery

## 2022-04-30 ENCOUNTER — Encounter (HOSPITAL_COMMUNITY)
Admission: RE | Disposition: A | Discharge status: Home / Self Care | Payer: Self-pay | Source: Ambulatory Visit | Attending: Orthopedic Surgery

## 2022-04-30 ENCOUNTER — Inpatient Hospital Stay (HOSPITAL_COMMUNITY): Payer: Medicare Other | Admitting: Anesthesiology

## 2022-04-30 ENCOUNTER — Inpatient Hospital Stay (HOSPITAL_COMMUNITY): Payer: Medicare Other

## 2022-04-30 ENCOUNTER — Other Ambulatory Visit: Payer: Self-pay

## 2022-04-30 DIAGNOSIS — K7460 Unspecified cirrhosis of liver: Secondary | ICD-10-CM | POA: Diagnosis present

## 2022-04-30 DIAGNOSIS — Z853 Personal history of malignant neoplasm of breast: Secondary | ICD-10-CM | POA: Diagnosis not present

## 2022-04-30 DIAGNOSIS — M1712 Unilateral primary osteoarthritis, left knee: Secondary | ICD-10-CM | POA: Diagnosis present

## 2022-04-30 DIAGNOSIS — Z803 Family history of malignant neoplasm of breast: Secondary | ICD-10-CM | POA: Diagnosis not present

## 2022-04-30 DIAGNOSIS — Z79811 Long term (current) use of aromatase inhibitors: Secondary | ICD-10-CM

## 2022-04-30 DIAGNOSIS — Y792 Prosthetic and other implants, materials and accessory orthopedic devices associated with adverse incidents: Secondary | ICD-10-CM | POA: Diagnosis present

## 2022-04-30 DIAGNOSIS — M8580 Other specified disorders of bone density and structure, unspecified site: Secondary | ICD-10-CM | POA: Diagnosis present

## 2022-04-30 DIAGNOSIS — K219 Gastro-esophageal reflux disease without esophagitis: Secondary | ICD-10-CM | POA: Diagnosis present

## 2022-04-30 DIAGNOSIS — F319 Bipolar disorder, unspecified: Secondary | ICD-10-CM

## 2022-04-30 DIAGNOSIS — Z79899 Other long term (current) drug therapy: Secondary | ICD-10-CM

## 2022-04-30 DIAGNOSIS — S72492K Other fracture of lower end of left femur, subsequent encounter for closed fracture with nonunion: Secondary | ICD-10-CM | POA: Diagnosis not present

## 2022-04-30 DIAGNOSIS — T84115A Breakdown (mechanical) of internal fixation device of left femur, initial encounter: Secondary | ICD-10-CM

## 2022-04-30 DIAGNOSIS — Y798 Miscellaneous orthopedic devices associated with adverse incidents, not elsewhere classified: Secondary | ICD-10-CM | POA: Diagnosis present

## 2022-04-30 DIAGNOSIS — Z791 Long term (current) use of non-steroidal anti-inflammatories (NSAID): Secondary | ICD-10-CM

## 2022-04-30 DIAGNOSIS — I10 Essential (primary) hypertension: Secondary | ICD-10-CM

## 2022-04-30 DIAGNOSIS — Z8041 Family history of malignant neoplasm of ovary: Secondary | ICD-10-CM | POA: Diagnosis not present

## 2022-04-30 DIAGNOSIS — Z800 Family history of malignant neoplasm of digestive organs: Secondary | ICD-10-CM | POA: Diagnosis not present

## 2022-04-30 DIAGNOSIS — Z833 Family history of diabetes mellitus: Secondary | ICD-10-CM | POA: Diagnosis not present

## 2022-04-30 DIAGNOSIS — D62 Acute posthemorrhagic anemia: Secondary | ICD-10-CM | POA: Diagnosis not present

## 2022-04-30 DIAGNOSIS — Z87891 Personal history of nicotine dependence: Secondary | ICD-10-CM

## 2022-04-30 DIAGNOSIS — F419 Anxiety disorder, unspecified: Secondary | ICD-10-CM | POA: Diagnosis present

## 2022-04-30 DIAGNOSIS — Z923 Personal history of irradiation: Secondary | ICD-10-CM

## 2022-04-30 DIAGNOSIS — Z8673 Personal history of transient ischemic attack (TIA), and cerebral infarction without residual deficits: Secondary | ICD-10-CM | POA: Diagnosis not present

## 2022-04-30 DIAGNOSIS — E876 Hypokalemia: Secondary | ICD-10-CM | POA: Diagnosis present

## 2022-04-30 DIAGNOSIS — B1920 Unspecified viral hepatitis C without hepatic coma: Secondary | ICD-10-CM | POA: Diagnosis present

## 2022-04-30 DIAGNOSIS — S72452K Displaced supracondylar fracture without intracondylar extension of lower end of left femur, subsequent encounter for closed fracture with nonunion: Secondary | ICD-10-CM

## 2022-04-30 DIAGNOSIS — X58XXXD Exposure to other specified factors, subsequent encounter: Secondary | ICD-10-CM | POA: Diagnosis present

## 2022-04-30 DIAGNOSIS — T84011D Broken internal left hip prosthesis, subsequent encounter: Principal | ICD-10-CM

## 2022-04-30 HISTORY — PX: PARTIAL KNEE ARTHROPLASTY: SHX2174

## 2022-04-30 HISTORY — PX: HARDWARE REMOVAL: SHX979

## 2022-04-30 LAB — ABO/RH: ABO/RH(D): AB POS

## 2022-04-30 SURGERY — REMOVAL, HARDWARE
Anesthesia: General | Site: Knee | Laterality: Left

## 2022-04-30 MED ORDER — GABAPENTIN 100 MG PO CAPS
100.0000 mg | ORAL_CAPSULE | Freq: Every morning | ORAL | Status: DC
  Administered 2022-05-01 – 2022-05-05 (×5): 100 mg via ORAL
  Filled 2022-04-30 (×5): qty 1

## 2022-04-30 MED ORDER — LACTATED RINGERS IV SOLN: INTRAVENOUS | Status: DC

## 2022-04-30 MED ORDER — PHENOL 1.4 % MT LIQD: 1.0000 | OROMUCOSAL | Status: DC | PRN

## 2022-04-30 MED ORDER — METOCLOPRAMIDE HCL 5 MG/ML IJ SOLN: 5.0000 mg | Freq: Three times a day (TID) | INTRAMUSCULAR | Status: DC | PRN

## 2022-04-30 MED ORDER — POLYETHYLENE GLYCOL 3350 17 G PO PACK: 17.0000 g | PACK | Freq: Every day | ORAL | Status: DC | PRN

## 2022-04-30 MED ORDER — HYDROMORPHONE HCL 1 MG/ML IJ SOLN
INTRAMUSCULAR | Status: AC
  Filled 2022-04-30: qty 1

## 2022-04-30 MED ORDER — AMLODIPINE BESYLATE 10 MG PO TABS
10.0000 mg | ORAL_TABLET | Freq: Every day | ORAL | Status: DC
  Administered 2022-05-02 – 2022-05-05 (×3): 10 mg via ORAL
  Filled 2022-04-30 (×4): qty 1

## 2022-04-30 MED ORDER — DOCUSATE SODIUM 100 MG PO CAPS
100.0000 mg | ORAL_CAPSULE | Freq: Two times a day (BID) | ORAL | Status: DC
  Administered 2022-04-30 – 2022-05-03 (×7): 100 mg via ORAL
  Filled 2022-04-30 (×10): qty 1

## 2022-04-30 MED ORDER — OXYCODONE HCL 5 MG PO TABS
5.0000 mg | ORAL_TABLET | ORAL | Status: DC | PRN
  Administered 2022-04-30: 10 mg via ORAL
  Administered 2022-04-30: 5 mg via ORAL
  Administered 2022-05-01 – 2022-05-05 (×12): 10 mg via ORAL
  Filled 2022-04-30 (×10): qty 2

## 2022-04-30 MED ORDER — POVIDONE-IODINE 10 % EX SWAB
2.0000 | Freq: Once | CUTANEOUS | Status: AC
  Administered 2022-04-30: 2 via TOPICAL

## 2022-04-30 MED ORDER — HYDRALAZINE HCL 25 MG PO TABS
25.0000 mg | ORAL_TABLET | Freq: Two times a day (BID) | ORAL | Status: DC
  Administered 2022-04-30 – 2022-05-05 (×6): 25 mg via ORAL
  Filled 2022-04-30 (×9): qty 1

## 2022-04-30 MED ORDER — ROCURONIUM BROMIDE 10 MG/ML (PF) SYRINGE
PREFILLED_SYRINGE | INTRAVENOUS | Status: AC
  Filled 2022-04-30: qty 10

## 2022-04-30 MED ORDER — METHOCARBAMOL 500 MG PO TABS
500.0000 mg | ORAL_TABLET | Freq: Four times a day (QID) | ORAL | Status: DC | PRN
  Administered 2022-04-30 – 2022-05-02 (×4): 500 mg via ORAL
  Filled 2022-04-30 (×4): qty 1

## 2022-04-30 MED ORDER — FENTANYL CITRATE (PF) 100 MCG/2ML IJ SOLN
INTRAMUSCULAR | Status: AC
  Filled 2022-04-30: qty 2

## 2022-04-30 MED ORDER — ISOPROPYL ALCOHOL 70 % SOLN
Status: AC
  Filled 2022-04-30: qty 480

## 2022-04-30 MED ORDER — KETOROLAC TROMETHAMINE 30 MG/ML IJ SOLN: INTRAMUSCULAR | Status: DC | PRN

## 2022-04-30 MED ORDER — ACETAMINOPHEN 325 MG PO TABS
325.0000 mg | ORAL_TABLET | Freq: Four times a day (QID) | ORAL | Status: DC | PRN
  Administered 2022-05-01 – 2022-05-03 (×2): 650 mg via ORAL
  Filled 2022-04-30 (×2): qty 2

## 2022-04-30 MED ORDER — FENTANYL CITRATE (PF) 100 MCG/2ML IJ SOLN
INTRAMUSCULAR | Status: DC | PRN
  Administered 2022-04-30 (×2): 50 ug via INTRAVENOUS

## 2022-04-30 MED ORDER — PANTOPRAZOLE SODIUM 40 MG PO TBEC
40.0000 mg | DELAYED_RELEASE_TABLET | Freq: Every day | ORAL | Status: DC
  Administered 2022-05-01 – 2022-05-05 (×5): 40 mg via ORAL
  Filled 2022-04-30 (×5): qty 1

## 2022-04-30 MED ORDER — SODIUM CHLORIDE (PF) 0.9 % IJ SOLN
INTRAMUSCULAR | Status: AC
  Filled 2022-04-30: qty 30

## 2022-04-30 MED ORDER — KETOROLAC TROMETHAMINE 30 MG/ML IJ SOLN
INTRAMUSCULAR | Status: AC
  Filled 2022-04-30: qty 1

## 2022-04-30 MED ORDER — ROPIVACAINE HCL 5 MG/ML IJ SOLN
INTRAMUSCULAR | Status: DC | PRN
  Administered 2022-04-30: 30 mL via PERINEURAL

## 2022-04-30 MED ORDER — DEXAMETHASONE SODIUM PHOSPHATE 4 MG/ML IJ SOLN
INTRAMUSCULAR | Status: DC | PRN
  Administered 2022-04-30: 5 mg via PERINEURAL

## 2022-04-30 MED ORDER — LIDOCAINE HCL (CARDIAC) PF 100 MG/5ML IV SOSY
PREFILLED_SYRINGE | INTRAVENOUS | Status: DC | PRN
  Administered 2022-04-30: 60 mg via INTRAVENOUS

## 2022-04-30 MED ORDER — HYDROMORPHONE HCL 1 MG/ML IJ SOLN
0.2500 mg | INTRAMUSCULAR | Status: DC | PRN
  Administered 2022-04-30 (×4): 0.5 mg via INTRAVENOUS

## 2022-04-30 MED ORDER — PROMETHAZINE HCL 25 MG/ML IJ SOLN: 6.2500 mg | INTRAMUSCULAR | Status: DC | PRN

## 2022-04-30 MED ORDER — AMISULPRIDE (ANTIEMETIC) 5 MG/2ML IV SOLN: 10.0000 mg | Freq: Once | INTRAVENOUS | Status: DC | PRN

## 2022-04-30 MED ORDER — BUPIVACAINE-EPINEPHRINE (PF) 0.25% -1:200000 IJ SOLN
INTRAMUSCULAR | Status: AC
  Filled 2022-04-30: qty 30

## 2022-04-30 MED ORDER — ONDANSETRON HCL 4 MG/2ML IJ SOLN
INTRAMUSCULAR | Status: DC | PRN
  Administered 2022-04-30: 4 mg via INTRAVENOUS

## 2022-04-30 MED ORDER — HYDROMORPHONE HCL 1 MG/ML IJ SOLN
INTRAMUSCULAR | Status: DC | PRN
  Administered 2022-04-30 (×4): .5 mg via INTRAVENOUS

## 2022-04-30 MED ORDER — GABAPENTIN 300 MG PO CAPS
300.0000 mg | ORAL_CAPSULE | Freq: Once | ORAL | Status: AC
  Administered 2022-04-30: 300 mg via ORAL
  Filled 2022-04-30: qty 1

## 2022-04-30 MED ORDER — ACETAMINOPHEN 500 MG PO TABS
1000.0000 mg | ORAL_TABLET | Freq: Once | ORAL | Status: AC
  Administered 2022-04-30: 1000 mg via ORAL
  Filled 2022-04-30: qty 2

## 2022-04-30 MED ORDER — TRANEXAMIC ACID-NACL 1000-0.7 MG/100ML-% IV SOLN
1000.0000 mg | INTRAVENOUS | Status: AC
  Administered 2022-04-30: 1000 mg via INTRAVENOUS
  Filled 2022-04-30: qty 100

## 2022-04-30 MED ORDER — OXYCODONE HCL 5 MG PO TABS
10.0000 mg | ORAL_TABLET | ORAL | Status: DC | PRN
  Administered 2022-05-02 (×2): 15 mg via ORAL
  Administered 2022-05-05: 10 mg via ORAL
  Filled 2022-04-30 (×3): qty 2
  Filled 2022-04-30 (×2): qty 3
  Filled 2022-04-30: qty 2

## 2022-04-30 MED ORDER — ANASTROZOLE 1 MG PO TABS
1.0000 mg | ORAL_TABLET | Freq: Every day | ORAL | Status: DC
  Administered 2022-05-01 – 2022-05-05 (×5): 1 mg via ORAL
  Filled 2022-04-30 (×5): qty 1

## 2022-04-30 MED ORDER — BUPIVACAINE-EPINEPHRINE 0.25% -1:200000 IJ SOLN: INTRAMUSCULAR | Status: DC | PRN

## 2022-04-30 MED ORDER — GABAPENTIN 300 MG PO CAPS
300.0000 mg | ORAL_CAPSULE | Freq: Every day | ORAL | Status: DC
  Administered 2022-04-30 – 2022-05-04 (×5): 300 mg via ORAL
  Filled 2022-04-30 (×5): qty 1

## 2022-04-30 MED ORDER — DEXAMETHASONE SODIUM PHOSPHATE 10 MG/ML IJ SOLN
INTRAMUSCULAR | Status: DC | PRN
  Administered 2022-04-30: 5 mg via INTRAVENOUS

## 2022-04-30 MED ORDER — DEXAMETHASONE SODIUM PHOSPHATE 10 MG/ML IJ SOLN
INTRAMUSCULAR | Status: AC
  Filled 2022-04-30: qty 1

## 2022-04-30 MED ORDER — HYDROMORPHONE HCL 1 MG/ML IJ SOLN: 0.5000 mg | INTRAMUSCULAR | Status: DC | PRN

## 2022-04-30 MED ORDER — CLONIDINE HCL (ANALGESIA) 100 MCG/ML EP SOLN
EPIDURAL | Status: DC | PRN
  Administered 2022-04-30: 80 ug

## 2022-04-30 MED ORDER — POVIDONE-IODINE 10 % EX SWAB: 2.0000 | Freq: Once | CUTANEOUS | Status: DC

## 2022-04-30 MED ORDER — HYDROMORPHONE HCL 2 MG/ML IJ SOLN
INTRAMUSCULAR | Status: AC
  Filled 2022-04-30: qty 1

## 2022-04-30 MED ORDER — FENTANYL CITRATE PF 50 MCG/ML IJ SOSY
50.0000 ug | PREFILLED_SYRINGE | Freq: Once | INTRAMUSCULAR | Status: AC
  Administered 2022-04-30: 50 ug via INTRAVENOUS
  Filled 2022-04-30: qty 2

## 2022-04-30 MED ORDER — MEPERIDINE HCL 50 MG/ML IJ SOLN: 6.2500 mg | INTRAMUSCULAR | Status: DC | PRN

## 2022-04-30 MED ORDER — ONDANSETRON HCL 4 MG/2ML IJ SOLN: 4.0000 mg | Freq: Four times a day (QID) | INTRAMUSCULAR | Status: DC | PRN

## 2022-04-30 MED ORDER — DIPHENHYDRAMINE HCL 12.5 MG/5ML PO ELIX: 12.5000 mg | ORAL_SOLUTION | ORAL | Status: DC | PRN

## 2022-04-30 MED ORDER — CEFAZOLIN SODIUM-DEXTROSE 2-4 GM/100ML-% IV SOLN
2.0000 g | Freq: Four times a day (QID) | INTRAVENOUS | Status: AC
  Administered 2022-04-30 – 2022-05-01 (×2): 2 g via INTRAVENOUS
  Filled 2022-04-30 (×2): qty 100

## 2022-04-30 MED ORDER — ONDANSETRON HCL 4 MG/2ML IJ SOLN
INTRAMUSCULAR | Status: AC
  Filled 2022-04-30: qty 2

## 2022-04-30 MED ORDER — SUGAMMADEX SODIUM 200 MG/2ML IV SOLN
INTRAVENOUS | Status: DC | PRN
  Administered 2022-04-30: 200 mg via INTRAVENOUS

## 2022-04-30 MED ORDER — MENTHOL 3 MG MT LOZG: 1.0000 | LOZENGE | OROMUCOSAL | Status: DC | PRN

## 2022-04-30 MED ORDER — ASPIRIN 81 MG PO CHEW
81.0000 mg | CHEWABLE_TABLET | Freq: Two times a day (BID) | ORAL | Status: DC
  Administered 2022-04-30 – 2022-05-05 (×10): 81 mg via ORAL
  Filled 2022-04-30 (×10): qty 1

## 2022-04-30 MED ORDER — ONDANSETRON HCL 4 MG PO TABS: 4.0000 mg | ORAL_TABLET | Freq: Four times a day (QID) | ORAL | Status: DC | PRN

## 2022-04-30 MED ORDER — ROCURONIUM BROMIDE 100 MG/10ML IV SOLN
INTRAVENOUS | Status: DC | PRN
  Administered 2022-04-30: 60 mg via INTRAVENOUS

## 2022-04-30 MED ORDER — ALUM & MAG HYDROXIDE-SIMETH 200-200-20 MG/5ML PO SUSP: 30.0000 mL | ORAL | Status: DC | PRN

## 2022-04-30 MED ORDER — BUPROPION HCL ER (SR) 150 MG PO TB12
150.0000 mg | ORAL_TABLET | Freq: Two times a day (BID) | ORAL | Status: DC
  Administered 2022-04-30 – 2022-05-05 (×10): 150 mg via ORAL
  Filled 2022-04-30 (×10): qty 1

## 2022-04-30 MED ORDER — SODIUM CHLORIDE (PF) 0.9 % IJ SOLN: INTRAMUSCULAR | Status: DC | PRN

## 2022-04-30 MED ORDER — METOCLOPRAMIDE HCL 5 MG PO TABS: 5.0000 mg | ORAL_TABLET | Freq: Three times a day (TID) | ORAL | Status: DC | PRN

## 2022-04-30 MED ORDER — CHLORHEXIDINE GLUCONATE 0.12 % MT SOLN
15.0000 mL | Freq: Once | OROMUCOSAL | Status: AC
  Administered 2022-04-30: 15 mL via OROMUCOSAL

## 2022-04-30 MED ORDER — SODIUM CHLORIDE 0.9 % IV SOLN: INTRAVENOUS | Status: DC

## 2022-04-30 MED ORDER — POTASSIUM CHLORIDE ER 10 MEQ PO TBCR
10.0000 meq | EXTENDED_RELEASE_TABLET | Freq: Every day | ORAL | Status: DC
  Administered 2022-05-01 – 2022-05-05 (×5): 10 meq via ORAL
  Filled 2022-04-30 (×10): qty 1

## 2022-04-30 MED ORDER — SENNA 8.6 MG PO TABS
1.0000 | ORAL_TABLET | Freq: Two times a day (BID) | ORAL | Status: DC
  Administered 2022-04-30 – 2022-05-03 (×7): 8.6 mg via ORAL
  Filled 2022-04-30 (×10): qty 1

## 2022-04-30 MED ORDER — ORAL CARE MOUTH RINSE: 15.0000 mL | Freq: Once | OROMUCOSAL | Status: AC

## 2022-04-30 MED ORDER — CELECOXIB 200 MG PO CAPS
200.0000 mg | ORAL_CAPSULE | Freq: Two times a day (BID) | ORAL | Status: DC
  Administered 2022-04-30 – 2022-05-05 (×10): 200 mg via ORAL
  Filled 2022-04-30 (×10): qty 1

## 2022-04-30 MED ORDER — CEFAZOLIN SODIUM-DEXTROSE 2-4 GM/100ML-% IV SOLN
2.0000 g | INTRAVENOUS | Status: AC
  Administered 2022-04-30: 2 g via INTRAVENOUS
  Filled 2022-04-30: qty 100

## 2022-04-30 MED ORDER — PROPOFOL 10 MG/ML IV BOLUS
INTRAVENOUS | Status: DC | PRN
  Administered 2022-04-30: 140 mg via INTRAVENOUS
  Administered 2022-04-30: 60 mg via INTRAVENOUS

## 2022-04-30 MED ORDER — MIDAZOLAM HCL 2 MG/2ML IJ SOLN
1.0000 mg | Freq: Once | INTRAMUSCULAR | Status: AC
  Administered 2022-04-30: 2 mg via INTRAVENOUS
  Filled 2022-04-30: qty 2

## 2022-04-30 MED ORDER — METHOCARBAMOL 500 MG IVPB - SIMPLE MED: 500.0000 mg | Freq: Four times a day (QID) | INTRAVENOUS | Status: DC | PRN

## 2022-04-30 MED ORDER — VITAMIN D 25 MCG (1000 UNIT) PO TABS
2000.0000 [IU] | ORAL_TABLET | Freq: Every day | ORAL | Status: DC
  Administered 2022-05-01 – 2022-05-05 (×5): 2000 [IU] via ORAL
  Filled 2022-04-30 (×5): qty 2

## 2022-04-30 MED ORDER — MIDAZOLAM HCL 2 MG/2ML IJ SOLN
INTRAMUSCULAR | Status: AC
  Filled 2022-04-30: qty 4

## 2022-04-30 MED ORDER — OXYCODONE HCL 5 MG PO TABS
ORAL_TABLET | ORAL | Status: AC
  Filled 2022-04-30: qty 1

## 2022-04-30 MED ORDER — METHOCARBAMOL 500 MG IVPB - SIMPLE MED
INTRAVENOUS | Status: AC
  Administered 2022-04-30: 500 mg
  Filled 2022-04-30: qty 55

## 2022-04-30 SURGICAL SUPPLY — 116 items
ADH SKN CLS APL DERMABOND .7 (GAUZE/BANDAGES/DRESSINGS) ×4
APL PRP STRL LF DISP 70% ISPRP (MISCELLANEOUS) ×4
AXLE ORTHOPEDIC SALVAGE SYSTEM (Knees) IMPLANT
AXLE TIB LFRIC INTFC KN OSS (Knees) ×2 IMPLANT
BAG COUNTER SPONGE SURGICOUNT (BAG) IMPLANT
BAG DECANTER FOR FLEXI CONT (MISCELLANEOUS) ×6 IMPLANT
BAG SPEC THK2 15X12 ZIP CLS (MISCELLANEOUS) ×2
BAG SPNG CNTER NS LX DISP (BAG)
BAG ZIPLOCK 12X15 (MISCELLANEOUS) ×3 IMPLANT
BANDAGE ESMARK 6X9 LF (GAUZE/BANDAGES/DRESSINGS) ×3 IMPLANT
BASEPLATE TIB OSS KNEE LONG 63 (Joint) IMPLANT
BEARING TIBIAL OSS ARCOM 12MM (Knees) IMPLANT
BIT DRILL QUICK REL 1/8 2PK SL (DRILL) IMPLANT
BLADE HEX COATED 2.75 (ELECTRODE) ×3 IMPLANT
BLADE SAW RECIPROCATING 77.5 (BLADE) ×3 IMPLANT
BLADE SAW SGTL 81X20 HD (BLADE) ×3 IMPLANT
BLADE SURG SZ10 CARB STEEL (BLADE) ×3 IMPLANT
BNDG CMPR 5X62 HK CLSR LF (GAUZE/BANDAGES/DRESSINGS) ×2
BNDG CMPR 9X6 STRL LF SNTH (GAUZE/BANDAGES/DRESSINGS) ×2
BNDG CMPR MED 10X6 ELC LF (GAUZE/BANDAGES/DRESSINGS) ×2
BNDG ELASTIC 6INX 5YD STR LF (GAUZE/BANDAGES/DRESSINGS) ×3 IMPLANT
BNDG ELASTIC 6X10 VLCR STRL LF (GAUZE/BANDAGES/DRESSINGS) IMPLANT
BNDG ESMARK 6X9 LF (GAUZE/BANDAGES/DRESSINGS) ×2
BRNG TIB STD 12 STRL KN (Knees) ×2 IMPLANT
BUSHING TIBIAL OSS ARCOM (Knees) IMPLANT
CANISTER WOUND CARE 500ML ATS (WOUND CARE) IMPLANT
CEMENT BONE REFOBACIN R1X40 US (Cement) IMPLANT
CEMENT RESTRICTOR BONE PREP ST (Cement) IMPLANT
CHLORAPREP W/TINT 26 (MISCELLANEOUS) ×6 IMPLANT
COVER MAYO STAND STRL (DRAPES) IMPLANT
COVER SURGICAL LIGHT HANDLE (MISCELLANEOUS) ×3 IMPLANT
CUFF TOURN SGL QUICK 34 (TOURNIQUET CUFF) ×2
CUFF TRNQT CYL 34X4.125X (TOURNIQUET CUFF) ×3 IMPLANT
DERMABOND ADVANCED .7 DNX12 (GAUZE/BANDAGES/DRESSINGS) ×6 IMPLANT
DRAPE C-ARM 42X120 X-RAY (DRAPES) IMPLANT
DRAPE C-ARMOR (DRAPES) ×3 IMPLANT
DRAPE HIP W/POCKET STRL (MISCELLANEOUS) ×3 IMPLANT
DRAPE INCISE IOBAN 66X45 STRL (DRAPES) ×9 IMPLANT
DRAPE INCISE IOBAN 85X60 (DRAPES) ×3 IMPLANT
DRAPE ORTHO SPLIT 77X108 STRL (DRAPES) ×4
DRAPE POUCH INSTRU U-SHP 10X18 (DRAPES) ×3 IMPLANT
DRAPE SHEET LG 3/4 BI-LAMINATE (DRAPES) ×9 IMPLANT
DRAPE SURG 17X11 SM STRL (DRAPES) ×3 IMPLANT
DRAPE SURG ORHT 6 SPLT 77X108 (DRAPES) ×6 IMPLANT
DRAPE U-SHAPE 47X51 STRL (DRAPES) ×3 IMPLANT
DRILL QUICK RELEASE 1/8 INCH (DRILL) ×4
DRSG AQUACEL AG ADV 3.5X 4 (GAUZE/BANDAGES/DRESSINGS) ×3 IMPLANT
DRSG AQUACEL AG ADV 3.5X10 (GAUZE/BANDAGES/DRESSINGS) ×3 IMPLANT
DRSG EMULSION OIL 3X16 NADH (GAUZE/BANDAGES/DRESSINGS) ×3 IMPLANT
DRSG TEGADERM 4X4.75 (GAUZE/BANDAGES/DRESSINGS) IMPLANT
ELECT BLADE TIP CTD 4 INCH (ELECTRODE) ×3 IMPLANT
ELECT REM PT RETURN 15FT ADLT (MISCELLANEOUS) ×3 IMPLANT
EVACUATOR 1/8 PVC DRAIN (DRAIN) ×3 IMPLANT
FACESHIELD WRAPAROUND (MASK) ×2 IMPLANT
FACESHIELD WRAPAROUND OR TEAM (MASK) ×9 IMPLANT
FEMORAL BUSHING OSS ARCOM SET (Knees) IMPLANT
FEMORAL COMP OSS 7CM LEFT (Knees) IMPLANT
GAUZE SPONGE 4X4 12PLY STRL (GAUZE/BANDAGES/DRESSINGS) ×3 IMPLANT
GLOVE BIO SURGEON STRL SZ8.5 (GLOVE) ×6 IMPLANT
GLOVE BIOGEL M 7.0 STRL (GLOVE) ×3 IMPLANT
GLOVE BIOGEL PI IND STRL 7.5 (GLOVE) ×3 IMPLANT
GLOVE BIOGEL PI IND STRL 8 (GLOVE) ×3 IMPLANT
GLOVE BIOGEL PI IND STRL 8.5 (GLOVE) ×3 IMPLANT
GLOVE SURG LX STRL 7.5 STRW (GLOVE) ×6 IMPLANT
GOWN SPEC L3 XXLG W/TWL (GOWN DISPOSABLE) ×6 IMPLANT
GUIDEWIRE BALL NOSE 80CM (WIRE) IMPLANT
HANDPIECE INTERPULSE COAX TIP (DISPOSABLE) ×2
HOLDER FOLEY CATH W/STRAP (MISCELLANEOUS) IMPLANT
HOOD PEEL AWAY T7 (MISCELLANEOUS) ×12 IMPLANT
IMMOBILIZER KNEE 20 (SOFTGOODS) ×2
IMMOBILIZER KNEE 20 THIGH 36 (SOFTGOODS) IMPLANT
JET LAVAGE IRRISEPT WOUND (IRRIGATION / IRRIGATOR)
KIT BASIN OR (CUSTOM PROCEDURE TRAY) ×3 IMPLANT
KIT TURNOVER KIT A (KITS) IMPLANT
LAVAGE JET IRRISEPT WOUND (IRRIGATION / IRRIGATOR) IMPLANT
MANIFOLD NEPTUNE II (INSTRUMENTS) ×3 IMPLANT
MARKER SKIN DUAL TIP RULER LAB (MISCELLANEOUS) ×3 IMPLANT
NDL SPNL 18GX3.5 QUINCKE PK (NEEDLE) ×3 IMPLANT
NEEDLE SPNL 18GX3.5 QUINCKE PK (NEEDLE) ×2 IMPLANT
NS IRRIG 1000ML POUR BTL (IV SOLUTION) ×3 IMPLANT
PACK TOTAL JOINT (CUSTOM PROCEDURE TRAY) ×3 IMPLANT
PACK TOTAL KNEE CUSTOM (KITS) ×3 IMPLANT
PADDING CAST COTTON 6X4 STRL (CAST SUPPLIES) IMPLANT
PIN LOCKING OSS ARCOM (Knees) IMPLANT
PROTECTOR NERVE ULNAR (MISCELLANEOUS) ×3 IMPLANT
SAW OSC TIP CART 19.5X105X1.3 (SAW) ×3 IMPLANT
SEALER BIPOLAR AQUA 6.0 (INSTRUMENTS) ×3 IMPLANT
SEGMENT OSS DIAPHYSEAL KNEE 5 (Joint) IMPLANT
SET HNDPC FAN SPRY TIP SCT (DISPOSABLE) ×3 IMPLANT
SET PAD KNEE POSITIONER (MISCELLANEOUS) ×3 IMPLANT
SPIKE FLUID TRANSFER (MISCELLANEOUS) IMPLANT
SPONGE DRAIN TRACH 4X4 STRL 2S (GAUZE/BANDAGES/DRESSINGS) ×3 IMPLANT
STAPLER VISISTAT 35W (STAPLE) IMPLANT
STEM BOWED IM W/SCREW 11X150 (Stem) IMPLANT
STEM POLY PAT PLY 32M KNEE (Knees) IMPLANT
SUT ETHILON 3 0 FSL (SUTURE) IMPLANT
SUT MNCRL AB 3-0 PS2 18 (SUTURE) ×3 IMPLANT
SUT MON AB 2-0 CT1 36 (SUTURE) ×6 IMPLANT
SUT STRATAFIX PDO 1 14 VIOLET (SUTURE) ×2
SUT STRATFX PDO 1 14 VIOLET (SUTURE) ×2
SUT VIC AB 1 CT1 36 (SUTURE) ×9 IMPLANT
SUT VIC AB 1 CTX 36 (SUTURE) ×4
SUT VIC AB 1 CTX36XBRD ANBCTR (SUTURE) ×6 IMPLANT
SUT VIC AB 2-0 CT1 27 (SUTURE) ×2
SUT VIC AB 2-0 CT1 TAPERPNT 27 (SUTURE) ×3 IMPLANT
SUTURE STRATFX PDO 1 14 VIOLET (SUTURE) ×3 IMPLANT
SYSTEM YOKE ORTHOPEDIC SALVAGE (Knees) IMPLANT
TIBIAL BEARING OSS ARCOM 12MM (Knees) ×2 IMPLANT
TIBIAL BUSHING OSS ARCOM (Knees) ×2 IMPLANT
TOWEL OR 17X26 10 PK STRL BLUE (TOWEL DISPOSABLE) ×6 IMPLANT
TOWER CARTRIDGE SMART MIX (DISPOSABLE) ×6 IMPLANT
TRAY FOLEY MTR SLVR 16FR STAT (SET/KITS/TRAYS/PACK) ×3 IMPLANT
TUBE SUCTION HIGH CAP CLEAR NV (SUCTIONS) ×3 IMPLANT
WATER STERILE IRR 1000ML POUR (IV SOLUTION) ×3 IMPLANT
WRAP KNEE MAXI GEL POST OP (GAUZE/BANDAGES/DRESSINGS) ×3 IMPLANT
YOKE ORTHOPEDIC SALVAGE SYSTEM (Knees) ×2 IMPLANT

## 2022-04-30 NOTE — Transfer of Care (Signed)
Immediate Anesthesia Transfer of Care Note  Patient: Tammy [name]  Procedure(s) Performed: HARDWARE REMOVAL LEFT FEMUR (Left) DISTAL FEMUR REPLACEMENT (Left: Knee)  Patient Location: PACU  Anesthesia Type:General  Level of Consciousness: awake, alert , and oriented  Airway & Oxygen Therapy: Patient Spontanous Breathing and Patient connected to face mask oxygen  Post-op Assessment: Report given to RN and Post -op Vital signs reviewed and stable  Post vital signs: Reviewed and stable  Last Vitals:  Vitals Value Taken Time  BP 152/69 04/30/22 1837  Temp 36.7 C 04/30/22 1837  Pulse 72 04/30/22 1841  Resp 18 04/30/22 1841  SpO2 95 % 04/30/22 1841  Vitals shown include unvalidated device data.  Last Pain:  Vitals:   04/30/22 1315  TempSrc:   PainSc: 0-No pain      Patients Stated Pain Goal: 4 ([id number])  Complications: No notable events documented.

## 2022-04-30 NOTE — Progress Notes (Signed)
Orthopedic Tech Progress Note Patient Details:  Tammy [name] Jan 19, 1956 [id number]  Patient ID: Tammy [name], female   DOB: [birth year], 67 y.o.   MRN: [id number]  Tammy [name] 04/30/2022, 7:43 PM Knee immobilizer applied in OR.

## 2022-04-30 NOTE — Op Note (Signed)
OPERATIVE REPORT  SURGEON: Rod Can, MD   PHYSICIAN ASSISTANT: Larene Pickett, PA-C, present and scrubbed throughout the case, critical for completion in a timely fashion, and for retraction, instrumentation, and closure.   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 Left knee osteoarthritis.  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 Left knee osteoarthritis.  PROCEDURE: 1. Removal of deep implant left femur. 2. Left distal femur replacement.  ANESTHESIA:  General and femoral block.  ESTIMATED BLOOD LOSS:-100 mL.    ANTIBIOTICS: 2 g Ancef.  TUBES AND DRAINS: 1. Medium Hemovac x1. 2. Negative pressure incisional dressing at 75 mmHg.   SPECIMENS: Left distal femur nonunion for routine aerobic and anaerobic culture swab.   EXPLANTS: NCB distal femur locking plate and screws.   IMPLANTS: [employer] orthopedics salvage system segmented femur size 7 cm with 5 cm diaphyseal segment and 11 x 150 mm bowed IM stem. Orthopedic salvage system nonmodular long tibial baseplate, size 63 mm tray with 160 x 10 mm stem. 12 mm ArCom tibial bearing with yolk, axial, femoral bushings, tibial bearing, and locking pin. 32 mm cemented all poly patella. Refobacin R bone cement.   TOURNIQUET TIME: 120 minutes at 350 mmHg.   COMPLICATIONS: None.   DISPOSITION: Stable to PACU.  BRIEF CLINICAL NOTE: Tammy [name] is a 67 y.o. female who had a ground-level fall and sustained a comminuted intra-articular left distal femur fracture that underwent open reduction internal fixation with locking plate and screw construct on 04/10/2021 by Dr. Marcelino Scot.  She developed a nonunion and fractured her plate.  I saw her in the office.  Dr. Marcelino Scot did a workup for infection which was negative.  She also had a CT scan.  She was indicated for hardware removal and conversion to left distal femur replacement.  The risks, benefits, and alternatives to the  procedure were explained, and the patient elected to proceed.  The risks, benefits, and alternatives were discussed with the patient. There are risks associated with the surgery including, but not limited to, problems with anesthesia (death), infection, instability (giving out of the joint), dislocation, differences in leg length/angulation/rotation, fracture of bones, loosening or failure of implants, hematoma (blood accumulation) which may require surgical drainage, blood clots, pulmonary embolism, nerve injury (foot drop and lateral thigh numbness), and blood vessel injury. The patient understands these risks and elects to proceed.  PROCEDURE IN DETAIL: Femoral nerve block was obtained in the pre-op holding area. Once inside the operative room, spinal anesthesia was obtained, and a foley catheter was inserted. The patient was then positioned and the lower extremity was prepped and draped in the normal sterile surgical fashion.  A time-out was called verifying side and site of surgery. The patient received IV antibiotics within 60 minutes of beginning the procedure.  Esmarch exsanguination was utilized, and the tourniquet was inflated to 350 mmHg.  Using a #10 blade, I made a modified swashbuckler incision, taking care to stay greater than 5 cm away from her previous lateral distal femur incision.  Full-thickness skin flaps were created. A standard lateral peripatellar arthrotomy was made to the level of the vastus lateralis with Bovie electrocautery.  The vastus lateralis was then peeled off the posterior intermuscular septum and retracted anteriorly.  The patella was subluxated medially.  I turned my attention to the distal femur. Retractors were placed medially and laterally.  The lateral locking plate was identified.  The site of the nonunion was identified.  There was no purulence or evidence  of infection. A Cobb was used to lift scar tissue from the plate.  I identified the proximal end of the plate.   I removed the proximal interlocking screws usually with a screwdriver.  The proximal plate was removed.  I debrided the screw holes with a curette.  I returned my attention to the nonunion site.  Bennett retractors were placed medially and laterally.  Approximately 2 cm proximal to the site of the nonunion, I used a saw to transect the femur.  A bone hook was placed in the intramedullary canal of the distal fracture fragment, and the femur was lifted anteriorly and distally. Careful subperiosteal dissection was performed with Bovie electrocautery to remove the distal femur. The distal femur had full thickness cartilage loss.  An entry drill was used to gain access to the intramedullary canal of the tibia.  The intramedullary guide rod was placed into the tibia.  Using an extramedullary cutting guide, the proximal tibia cut was made, taking a 12 mm resection off of the lateral compartment.  I then sized the tibia to a 63 mm component.  The trial component was pinned to the tibia after setting the appropriate rotation.  The one-step reamer was used to prepare the tibia. Keel punch was used.  The trial tibia was placed.  I then returned my attention to the femur.  Flexible reaming was performed up to a size 14 mm reamer with excellent chatter.  The proximal chamfer reamer was used followed by the planer.  The trial femoral component was assembled on the back table and placed.  Trial polyliner was placed.   The knee was brought into extension.  A 5 mm diaphyseal segment provided excellent soft tissue tension.  The location of the patella was noted to be appropriate with respect to the femoral component.  A freehand resection of the patella was performed.  There was full thickness cartilage loss on the patella. The patella was sized to a 32 mm implant, and the patella was prepared with 3 lug holes.  The trial components were removed. The real implants were opened and assembled on the back table. The  intramedullary canals were cleaned with a brush and irrigated with pulsatile lavage.  The canals were dried with intramedullary tampons.. Cement plugs were placed in the femur and tibia. Cement was mixed on the back table and pressurized into the tibial and femoral canals. The real tibial and femoral components were inserted, taking care to maintain appropriate external rotation.  The patellar component was also cemented into place.  Final trialing with a 12 mm bearing was performed confirming adequate soft tissue tension. The appropriate bushings were inserted and the real 12 mm bearing was placed. The axle was placed followed by the locking pin. There is excellent range of motion and stability. The patella tracked centrally with no thumbs technique.    The tourniquet was then let down, and meticulous hemostasis was achieved with the Aquamantys and Bovie electrocautery. The arthrotomy was closed over a medium Hemovac drain using #1 Vicryl and #1 Stratafix. Deep dermal layer was closed with 2-0 interrupted Monocryl sutures. Skin was reapproximated with staples.  A custom negative pressure dressing was placed with Adaptic and cut portions of black granufoam sponge.  Suction was applied at 75 mmHg.  There was excellent seal without any leak.. Bulky sterile dressing with cast padding and Ace wrap was applied.  A knee immobilizer was placed.  Pulse examination of the lower extremity revealed intact palpable pedal pulses, symmetric  to preop. She was then awakened from anesthesia and taken to the PACU in stable condition. Sponge, needle, and instrument counts were correct at the end of the case x2. There were no known complications.

## 2022-04-30 NOTE — Anesthesia Postprocedure Evaluation (Signed)
Anesthesia Post Note  Patient: Tammy [name]  Procedure(s) Performed: HARDWARE REMOVAL LEFT FEMUR (Left) DISTAL FEMUR REPLACEMENT (Left: Knee)     Patient location during evaluation: PACU Anesthesia Type: General Level of consciousness: awake and alert Pain management: pain level controlled Vital Signs Assessment: post-procedure vital signs reviewed and stable Respiratory status: spontaneous breathing, nonlabored ventilation, respiratory function stable and patient connected to nasal cannula oxygen Cardiovascular status: blood pressure returned to baseline and stable Postop Assessment: no apparent nausea or vomiting Anesthetic complications: no  No notable events documented.  Last Vitals:  Vitals:   04/30/22 1930 04/30/22 2016  BP: (!) 148/79 127/81  Pulse: 84 85  Resp: 12 18  Temp: 36.6 C 37.2 C  SpO2: 97% 100%    Last Pain:  Vitals:   04/30/22 2016  TempSrc: Oral  PainSc:                  [name]

## 2022-04-30 NOTE — Anesthesia Procedure Notes (Signed)
Procedure Name: Intubation Date/Time: 04/30/2022 2:23 PM  Performed by: Randye Lobo, CRNAPre-anesthesia Checklist: Patient identified, Emergency Drugs available, Suction available and Patient being monitored Patient Re-evaluated:Patient Re-evaluated prior to induction Oxygen Delivery Method: Circle System Utilized Preoxygenation: Pre-oxygenation with 100% oxygen Induction Type: IV induction Ventilation: Mask ventilation without difficulty Laryngoscope Size: Mac and 3 Grade View: Grade II Tube type: Oral Tube size: 7.0 mm Number of attempts: 1 Airway Equipment and Method: Stylet and Oral airway Placement Confirmation: ETT inserted through vocal cords under direct vision, positive ETCO2 and breath sounds checked- equal and bilateral Secured at: 22 cm Tube secured with: Tape Dental Injury: Teeth and Oropharynx as per pre-operative assessment

## 2022-04-30 NOTE — Progress Notes (Signed)
Type and screen requsted- Done prior to surgery

## 2022-04-30 NOTE — Anesthesia Procedure Notes (Signed)
Anesthesia Regional Block: Femoral nerve block   Pre-Anesthetic Checklist: , timeout performed,  Correct Patient, Correct Site, Correct Laterality,  Correct Procedure, Correct Position, site marked,  Risks and benefits discussed,  Surgical consent,  Pre-op evaluation,  At surgeon's request and post-op pain management  Laterality: Lower and Left  Prep: chloraprep       Needles:  Injection technique: Single-shot  Needle Type: Stimulator Needle - 80     Needle Length: 9cm  Needle Gauge: 22   Needle insertion depth: 6 cm   Additional Needles:   Procedures:, nerve stimulator,,, ultrasound used (permanent image in chart),,     Nerve Stimulator or Paresthesia:  Response: Patellar snap, 0.5 mA  Additional Responses:   Narrative:  Start time: 04/30/2022 12:50 PM End time: 04/30/2022 1:10 PM Injection made incrementally with aspirations every 5 mL.  Performed by: Personally  Anesthesiologist: Nolon Nations, MD  Additional Notes: BP cuff, EKG monitors applied. Sedation begun. Femoral artery palpated for location of nerve. After nerve location verified with U/S, anesthetic injected incrementally, slowly, and after negative aspirations under direct u/s guidance. Good perineural spread. Patient tolerated well.

## 2022-04-30 NOTE — Interval H&P Note (Signed)
History and Physical Interval Note:  04/30/2022 2:18 PM  Tammy [name] Suheyla Grumbling  has presented today for surgery, with the diagnosis of Left femur nonunion, retained hardware.  The various methods of treatment have been discussed with the patient and family. After consideration of risks, benefits and other options for treatment, the patient has consented to  Procedure(s) with comments: HARDWARE REMOVAL LEFT FEMUR (Left) - 150 DISTAL FEMUR REPLACEMENT (Left) - 150 as a surgical intervention.  The patient's history has been reviewed, patient examined, no change in status, stable for surgery.  I have reviewed the patient's chart and labs.  Questions were answered to the patient's satisfaction.     Tammy [name]

## 2022-05-01 ENCOUNTER — Other Ambulatory Visit: Payer: Self-pay

## 2022-05-01 LAB — BASIC METABOLIC PANEL
Anion gap: 7 (ref 5–15)
BUN: 13 mg/dL (ref 8–23)
CO2: 22 mmol/L (ref 22–32)
Calcium: 7.8 mg/dL — ABNORMAL LOW (ref 8.9–10.3)
Chloride: 104 mmol/L (ref 98–111)
Creatinine, Ser: 0.74 mg/dL (ref 0.44–1.00)
GFR, Estimated: >60 mL/min (ref >60)
Glucose, Bld: 152 mg/dL — ABNORMAL HIGH (ref 70–99)
Potassium: 3.6 mmol/L (ref 3.5–5.1)
Sodium: 133 mmol/L — ABNORMAL LOW (ref 135–145)

## 2022-05-01 LAB — CBC
HCT: 26.4 % — ABNORMAL LOW (ref 36.0–46.0)
Hemoglobin: 8.3 g/dL — ABNORMAL LOW (ref 12.0–15.0)
MCH: 28.9 pg (ref 26.0–34.0)
MCHC: 31.4 g/dL (ref 30.0–36.0)
MCV: 92 fL (ref 80.0–100.0)
Platelets: 189 10*3/uL (ref 150–400)
RBC: 2.87 MIL/uL — ABNORMAL LOW (ref 3.87–5.11)
RDW: 13.8 % (ref 11.5–15.5)
WBC: 8 10*3/uL (ref 4.0–10.5)
nRBC: 0 % (ref 0.0–0.2)

## 2022-05-01 NOTE — Evaluation (Signed)
Physical Therapy Evaluation Patient Details Name: Tammy [name] MRN: [id number] DOB: 1955-04-22 Today's Date: 05/01/2022  History of Present Illness  67 y.o. 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 She is 11 months s/p left distal femur ORIF fracture 04/10/21 with Dr. Marcelino Scot. She has been unable to weightbear in the left leg. 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 s/p removal of implant and replacemtn of L distal femur 04/30/22. PMH: bipolar, CVA.  Clinical Impression  Pt admitted with above diagnosis. Pt ambulated 18' with RW and L KI, no loss of balance. Bloody drainage noted on bedsheet, RN notified. Good progress expected.  Pt currently with functional limitations due to the deficits listed below (see PT Problem List). Pt will benefit from acute skilled PT to increase their independence and safety with mobility to allow discharge.          Recommendations for follow up therapy are one component of a multi-disciplinary discharge planning process, led by the attending physician.  Recommendations may be updated based on patient status, additional functional criteria and insurance authorization.  Follow Up Recommendations Follow physician's recommendations for discharge plan and follow up therapies      Assistance Recommended at Discharge Intermittent Supervision/Assistance  Patient can return home with the following  A little help with walking and/or transfers;A little help with bathing/dressing/bathroom;Assist for transportation;Help with stairs or ramp for entrance;Assistance with cooking/housework    Equipment Recommendations None recommended by PT  Recommendations for Other Services       Functional Status Assessment Patient  has had a recent decline in their functional status and demonstrates the ability to make significant improvements in function in a reasonable and predictable amount of time.     Precautions / Restrictions Precautions Precautions: Fall Precaution Comments: denies falls in past 6 months Required Braces or Orthoses: Knee Immobilizer - Left Knee Immobilizer - Left: On when out of bed or walking Restrictions Weight Bearing Restrictions: No LLE Weight Bearing: Weight bearing as tolerated      Mobility  Bed Mobility Overal bed mobility: Needs Assistance Bed Mobility: Supine to Sit     Supine to sit: HOB elevated, Min assist     General bed mobility comments: used rail, used gait belt as leg lifter, min A to raise trunk    Transfers Overall transfer level: Needs assistance Equipment used: Rolling walker (2 wheels) Transfers: Sit to/from Stand Sit to Stand: Min guard, From elevated surface           General transfer comment: VCs hand placement    Ambulation/Gait Ambulation/Gait assistance: Supervision Gait Distance (Feet): 36 Feet Assistive device: Rolling walker (2 wheels) Gait Pattern/deviations: Step-to pattern Gait velocity: decr     General Gait Details: KI LLE, distance limited by fatigue, no loss of balance  Stairs            Wheelchair Mobility    Modified Rankin (Stroke Patients Only)       Balance Overall balance assessment: Modified Independent                                           Pertinent Vitals/Pain Pain Assessment Pain  Assessment: 0-10 Pain Score: 2  Pain Location: L knee with walking Pain Descriptors / Indicators: Operative site guarding Pain Intervention(s): Limited activity within patient's tolerance, Monitored during session, Premedicated before session, Repositioned    Home Living Family/patient expects to be discharged to:: Private residence Living Arrangements: Spouse/significant other Available Help at  Discharge: Family;Available 24 hours/day   Home Access: Stairs to enter Entrance Stairs-Rails: Can reach both;Left;Right Entrance Stairs-Number of Steps: 5   Home Layout: One level Home Equipment: Conservation officer, nature (2 wheels);Rollator (4 wheels);Shower seat;Hand held shower head;Toilet riser      Prior Function Prior Level of Function : Driving;Independent/Modified Independent             Mobility Comments: walked with RW, couldn't WB thru LLE denies falls in past 6 months ADLs Comments: independent, used shower seat     Hand Dominance        Extremity/Trunk Assessment   Upper Extremity Assessment Upper Extremity Assessment: Overall WFL for tasks assessed    Lower Extremity Assessment Lower Extremity Assessment: LLE deficits/detail LLE Deficits / Details: ankle WNL, hip SLR 2/5, knee NT 2* active bloody drainage (RN notified) LLE Sensation: WNL       Communication   Communication: No difficulties  Cognition Arousal/Alertness: Awake/alert Behavior During Therapy: WFL for tasks assessed/performed Overall Cognitive Status: Within Functional Limits for tasks assessed                                          General Comments      Exercises General Exercises - Lower Extremity Ankle Circles/Pumps: AROM, Both, 10 reps, Supine   Assessment/Plan    PT Assessment Patient needs continued PT services  PT Problem List Decreased range of motion;Decreased strength;Decreased mobility;Decreased activity tolerance;Decreased balance;Pain       PT Treatment Interventions Gait training;Therapeutic activities;DME instruction;Stair training;Therapeutic exercise    PT Goals (Current goals can be found in the Care Plan section)  Acute Rehab PT Goals Patient Stated Goal: walk PT Goal Formulation: With patient Time For Goal Achievement: 05/08/22 Potential to Achieve Goals: Good    Frequency Min 5X/week     Co-evaluation               AM-PAC PT "6  Clicks" Mobility  Outcome Measure Help needed turning from your back to your side while in a flat bed without using bedrails?: A Little Help needed moving from lying on your back to sitting on the side of a flat bed without using bedrails?: A Little Help needed moving to and from a bed to a chair (including a wheelchair)?: A Little Help needed standing up from a chair using your arms (e.g., wheelchair or bedside chair)?: A Little Help needed to walk in hospital room?: A Little Help needed climbing 3-5 steps with a railing? : A Lot 6 Click Score: 17    End of Session Equipment Utilized During Treatment: Gait belt;Left knee immobilizer Activity Tolerance: Patient tolerated treatment well Patient left: in chair;with chair alarm set;with call bell/phone within reach Nurse Communication: Mobility status PT Visit Diagnosis: Difficulty in walking, not elsewhere classified (R26.2);Pain Pain - Right/Left: Left Pain - part of body: Knee    Time: [id number] PT Time Calculation (min) (ACUTE ONLY): 20 min   Charges:   PT Evaluation $PT Eval Moderate Complexity: 1 Mod         Blondell Reveal Kistler PT 05/01/2022  [place]  Office [phone]

## 2022-05-01 NOTE — Plan of Care (Signed)

## 2022-05-01 NOTE — TOC Transition Note (Signed)
Transition of Care [facility]) - CM/SW Discharge Note   Patient Details  Name: Tammy [name] MRN: [id number] Date of Birth: 1955/05/09  [facility] CM/SW Contact:  Lennart Pall, LCSW Phone Number: 05/01/2022, 1:55 PM   Clinical Narrative:     Met with pt today who confirms she has needed DME at home.  Pt is setting up her OPPT on her own (prescription already provided).  No further TOC needs.  Final next level of care: OP Rehab Barriers to Discharge: No Barriers Identified   Patient Goals and CMS Choice      Discharge Placement                         Discharge Plan and Services Additional resources added to the After Visit Summary for                  DME Arranged: N/A DME Agency: NA                  Social Determinants of Health (SDOH) Interventions SDOH Screenings   Tobacco Use: Medium Risk (04/30/2022)     Readmission Risk Interventions    05/01/2022    1:55 PM  Readmission Risk Prevention Plan  Post Dischage Appt Complete  Medication Screening Complete  Transportation Screening Complete

## 2022-05-01 NOTE — Plan of Care (Signed)
  Problem: Education: Goal: Knowledge of General Education information will improve Description Including pain rating scale, medication(s)/side effects and non-pharmacologic comfort measures Outcome: Progressing   

## 2022-05-01 NOTE — Progress Notes (Signed)
    Subjective:  Patient reports pain as mild to moderate.  Denies N/V/CP/SOB/Abd pain. She reports mild knee pain. She denies any tingling or numbness in LE bilaterally. She is moving her foot well on exam this morning.   Objective:   VITALS:   Vitals:   04/30/22 2229 05/01/22 0156 05/01/22 0552 05/01/22 0835  BP: (!) 161/86 138/74 (!) 140/68 117/68  Pulse: 79 74 68 68  Resp: 16 16 15 16   Temp: 98.7 F (37.1 C) 98.8 F (37.1 C) 98.1 F (36.7 C) 98.8 F (37.1 C)  TempSrc: Oral     SpO2: 100% 100% 100% 100%  Weight:      Height:        Patient is lying in bed. NAD.  Neurologically intact ABD soft Neurovascular intact Sensation intact distally Intact pulses distally Dorsiflexion/Plantar flexion intact No cellulitis present Compartment soft Negative pressure dressing intact, no leaks detected. House vac on 75 mmHg. No drainage.  Hemovac drain 50cc bloody fluid.  She is in the knee immobilizer.   Lab Results  Component Value Date   WBC 8.0 05/01/2022   HGB 8.3 (L) 05/01/2022   HCT 26.4 (L) 05/01/2022   MCV 92.0 05/01/2022   PLT 189 05/01/2022   BMET    Component Value Date/Time   NA 133 (L) 05/01/2022 0330   K 3.6 05/01/2022 0330   CL 104 05/01/2022 0330   CO2 22 05/01/2022 0330   GLUCOSE 152 (H) 05/01/2022 0330   BUN 13 05/01/2022 0330   CREATININE 0.74 05/01/2022 0330   CREATININE 0.76 07/10/2020 1220   CALCIUM 7.8 (L) 05/01/2022 0330   GFRNONAA >60 05/01/2022 0330   GFRNONAA >60 07/10/2020 1220     Assessment/Plan: 1 Day Post-Op   Principal Problem:   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, left, with nonunion, subsequent encounter Active Problems:   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  ABLA. Hemoglobin 8.3. Asymptomatic. Continue to monitor.   WBAT with walker DVT ppx: Aspirin, SCDs, TEDS PO pain control PT/OT Dispo:  - Knee immobilizer can be removed once patient can  extend her knee on her own and work on range of motion. WBAT w/walker.  - Continue hemovac drain do not remove.  - Negative pressure dressing intact no leaks detected.  - Upon discharge nurse to exchange house vac to portable prevena suction unit.    Charlott Rakes, PA-C 05/01/2022, 1:00 PM   [facility]  [place] [address], [place], [place] [address] Phone: [phone] [email or web address] Facebook  [employer]

## 2022-05-01 NOTE — Progress Notes (Signed)
245 ml has been emptied from Hemovac in the past two hours. Hemovac reinforced with ABD pads and tape. Larene Pickett, [place] made aware of patient's Hemovac drainage. No new orders at this time. Was told by PA to notify her if drainage is greater than 400 ml in a 4 hour time frame.

## 2022-05-02 LAB — CBC WITH DIFFERENTIAL/PLATELET
Abs Immature Granulocytes: 0.02 10*3/uL (ref 0.00–0.07)
Basophils Absolute: 0 10*3/uL (ref 0.0–0.1)
Basophils Relative: 0 %
Eosinophils Absolute: 0.1 10*3/uL (ref 0.0–0.5)
Eosinophils Relative: 2 %
HCT: 23.9 % — ABNORMAL LOW (ref 36.0–46.0)
Hemoglobin: 7.5 g/dL — ABNORMAL LOW (ref 12.0–15.0)
Immature Granulocytes: 0 %
Lymphocytes Relative: 16 %
Lymphs Abs: 1.3 10*3/uL (ref 0.7–4.0)
MCH: 28.8 pg (ref 26.0–34.0)
MCHC: 31.4 g/dL (ref 30.0–36.0)
MCV: 91.9 fL (ref 80.0–100.0)
Monocytes Absolute: 0.9 10*3/uL (ref 0.1–1.0)
Monocytes Relative: 12 %
Neutro Abs: 5.5 10*3/uL (ref 1.7–7.7)
Neutrophils Relative %: 70 %
Platelets: 194 10*3/uL (ref 150–400)
RBC: 2.6 MIL/uL — ABNORMAL LOW (ref 3.87–5.11)
RDW: 14.3 % (ref 11.5–15.5)
WBC: 7.8 10*3/uL (ref 4.0–10.5)
nRBC: 0 % (ref 0.0–0.2)

## 2022-05-02 LAB — CBC
HCT: 22.5 % — ABNORMAL LOW (ref 36.0–46.0)
Hemoglobin: 7 g/dL — ABNORMAL LOW (ref 12.0–15.0)
MCH: 28.3 pg (ref 26.0–34.0)
MCHC: 31.1 g/dL (ref 30.0–36.0)
MCV: 91.1 fL (ref 80.0–100.0)
Platelets: 172 10*3/uL (ref 150–400)
RBC: 2.47 MIL/uL — ABNORMAL LOW (ref 3.87–5.11)
RDW: 13.9 % (ref 11.5–15.5)
WBC: 6.7 10*3/uL (ref 4.0–10.5)
nRBC: 0 % (ref 0.0–0.2)

## 2022-05-02 MED ORDER — FERROUS SULFATE 325 (65 FE) MG PO TABS
325.0000 mg | ORAL_TABLET | Freq: Every day | ORAL | Status: DC
  Administered 2022-05-02 – 2022-05-05 (×4): 325 mg via ORAL
  Filled 2022-05-02 (×4): qty 1

## 2022-05-02 NOTE — Plan of Care (Signed)
  Problem: Education: Goal: Knowledge of General Education information will improve Description Including pain rating scale, medication(s)/side effects and non-pharmacologic comfort measures Outcome: Progressing   

## 2022-05-02 NOTE — Progress Notes (Addendum)
Physical Therapy Treatment Patient Details Name: Tammy [name] MRN: [id number] DOB: 02-09-1956 Today's Date: 05/02/2022   History of Present Illness 67 y.o. 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 She is 11 months s/p left distal femur ORIF fracture 04/10/21 with Dr. Marcelino Scot. She has been unable to weightbear in the left leg. 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 s/p removal of implant and replacemtn of L distal femur 04/30/22. PMH: bipolar, CVA.    PT Comments    Noted significant bloody drainage on bed pad and saturated through ace wrap and bandages L posterior thigh, RN notified. Pt ambulated 20' with RW, distance limited by pain. Instructed in HEP, she tolerated this well. Noted Hgb 7.0 today, pt denied dizziness throughout PT session.    Recommendations for follow up therapy are one component of a multi-disciplinary discharge planning process, led by the attending physician.  Recommendations may be updated based on patient status, additional functional criteria and insurance authorization.  Follow Up Recommendations  Follow physician's recommendations for discharge plan and follow up therapies     Assistance Recommended at Discharge Intermittent Supervision/Assistance  Patient can return home with the following A little help with walking and/or transfers;A little help with bathing/dressing/bathroom;Assist for transportation;Help with stairs or ramp for entrance;Assistance with cooking/housework   Equipment Recommendations  None recommended by PT    Recommendations for Other Services       Precautions / Restrictions Precautions Precautions: Fall Precaution Comments: denies falls in past 6 months; Ki when walking, can  remove for ROM Required Braces or Orthoses: Knee Immobilizer - Left Knee Immobilizer - Left: On when out of bed or walking;Discontinue once straight leg raise with < 10 degree lag (per ortho PA progress note, KI can be removed for ROM, wear KI "until pt can extend knee herself") Restrictions Weight Bearing Restrictions: No LLE Weight Bearing: Weight bearing as tolerated     Mobility  Bed Mobility Overal bed mobility: Modified Independent Bed Mobility: Supine to Sit     Supine to sit: HOB elevated, Modified independent (Device/Increase time)     General bed mobility comments: used rail, HOB up    Transfers Overall transfer level: Needs assistance Equipment used: Rolling walker (2 wheels) Transfers: Sit to/from Stand Sit to Stand: From elevated surface, Supervision           General transfer comment: VCs hand placement    Ambulation/Gait Ambulation/Gait assistance: Supervision Gait Distance (Feet): 20 Feet Assistive device: Rolling walker (2 wheels) Gait Pattern/deviations: Step-to pattern Gait velocity: decr     General Gait Details: distance limited by pain   Stairs             Wheelchair Mobility    Modified Rankin (Stroke Patients Only)       Balance Overall balance assessment: Modified Independent                                          Cognition Arousal/Alertness: Awake/alert Behavior During Therapy: WFL for tasks assessed/performed Overall Cognitive Status: Within Functional Limits for tasks assessed  Exercises General Exercises - Lower Extremity Ankle Circles/Pumps: AROM, Both, 10 reps, Supine Quad Sets: AROM, Both, 5 reps, Supine Long Arc Quad: AROM, Left, 5 reps, Seated Heel Slides: AAROM, Left, 5 reps, Supine Hip ABduction/ADduction: AAROM, Left, 5 reps, Supine    General Comments        Pertinent Vitals/Pain Pain Assessment Pain Score: 5  Pain  Location: L knee with walking Pain Descriptors / Indicators: Operative site guarding Pain Intervention(s): Limited activity within patient's tolerance, Monitored during session, Premedicated before session, Ice applied    Home Living                          Prior Function            PT Goals (current goals can now be found in the care plan section) Acute Rehab PT Goals Patient Stated Goal: walk PT Goal Formulation: With patient Time For Goal Achievement: 05/08/22 Potential to Achieve Goals: Good Progress towards PT goals: Progressing toward goals    Frequency    Min 5X/week      PT Plan Current plan remains appropriate    Co-evaluation              AM-PAC PT "6 Clicks" Mobility   Outcome Measure  Help needed turning from your back to your side while in a flat bed without using bedrails?: A Little Help needed moving from lying on your back to sitting on the side of a flat bed without using bedrails?: A Little Help needed moving to and from a bed to a chair (including a wheelchair)?: A Little Help needed standing up from a chair using your arms (e.g., wheelchair or bedside chair)?: A Little Help needed to walk in hospital room?: A Little Help needed climbing 3-5 steps with a railing? : A Lot 6 Click Score: 17    End of Session Equipment Utilized During Treatment: Gait belt;Left knee immobilizer Activity Tolerance: Patient tolerated treatment well Patient left: in chair;with chair alarm set;with call bell/phone within reach Nurse Communication: Mobility status PT Visit Diagnosis: Difficulty in walking, not elsewhere classified (R26.2);Pain Pain - Right/Left: Left Pain - part of body: Knee     Time: [id number] PT Time Calculation (min) (ACUTE ONLY): 23 min  Charges:  $Gait Training: 8-22 mins $Therapeutic Exercise: 8-22 mins                    Blondell Reveal Kistler PT 05/02/2022  Acute Rehabilitation Services  Office [phone]

## 2022-05-02 NOTE — Progress Notes (Signed)
Physical Therapy Treatment Patient Details Name: Tammy [name] MRN: [id number] DOB: November 26, 1955 Today's Date: 05/02/2022   History of Present Illness 67 y.o. 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 She is 11 months s/p left distal femur ORIF fracture 04/10/21 with Dr. Marcelino Scot. She has been unable to weightbear in the left leg. 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 s/p removal of implant and replacemtn of L distal femur 04/30/22. PMH: bipolar, CVA.    PT Comments    Pt is progressing well with mobility, she ambulated 23' with RW, no loss of balance. Will plan to do stair training tomorrow, then I expect she'll be ready to DC from a PT standpoint.    Recommendations for follow up therapy are one component of a multi-disciplinary discharge planning process, led by the attending physician.  Recommendations may be updated based on patient status, additional functional criteria and insurance authorization.  Follow Up Recommendations  Follow physician's recommendations for discharge plan and follow up therapies     Assistance Recommended at Discharge Intermittent Supervision/Assistance  Patient can return home with the following A little help with walking and/or transfers;A little help with bathing/dressing/bathroom;Assist for transportation;Help with stairs or ramp for entrance;Assistance with cooking/housework   Equipment Recommendations  None recommended by PT    Recommendations for Other Services       Precautions / Restrictions Precautions Precautions: Fall Precaution Comments: denies falls in past 6 months; Ki when walking, can remove for ROM Required Braces or Orthoses: Knee Immobilizer - Left Knee Immobilizer - Left: On when  out of bed or walking;Discontinue once straight leg raise with < 10 degree lag (per ortho PA progress note, KI can be removed for ROM, wear KI "until pt can extend knee herself") Restrictions Weight Bearing Restrictions: No LLE Weight Bearing: Weight bearing as tolerated     Mobility  Bed Mobility Overal bed mobility: Modified Independent Bed Mobility: Supine to Sit     Supine to sit: HOB elevated, Modified independent (Device/Increase time)     General bed mobility comments: up in recliner    Transfers Overall transfer level: Needs assistance Equipment used: Rolling walker (2 wheels) Transfers: Sit to/from Stand Sit to Stand: From elevated surface, Supervision           General transfer comment: VCs hand placement    Ambulation/Gait Ambulation/Gait assistance: Supervision Gait Distance (Feet): 80 Feet Assistive device: Rolling walker (2 wheels) Gait Pattern/deviations: Step-to pattern Gait velocity: decr     General Gait Details: distance limited by pain   Stairs             Wheelchair Mobility    Modified Rankin (Stroke Patients Only)       Balance Overall balance assessment: Modified Independent                                          Cognition Arousal/Alertness: Awake/alert Behavior During Therapy: WFL for tasks assessed/performed Overall Cognitive Status: Within Functional Limits for tasks assessed  Exercises General Exercises - Lower Extremity Ankle Circles/Pumps: AROM, Both, 10 reps, Supine Quad Sets: AROM, Both, 5 reps, Supine Long Arc Quad: AROM, Left, 5 reps, Seated Heel Slides: AAROM, Left, 5 reps, Supine Hip ABduction/ADduction: AAROM, Left, 5 reps, Supine    General Comments        Pertinent Vitals/Pain Pain Assessment Pain Score: 2  Pain Location: L knee with walking Pain Descriptors / Indicators: Operative site guarding Pain Intervention(s):  Limited activity within patient's tolerance, Monitored during session, Premedicated before session, Ice applied    Home Living                          Prior Function            PT Goals (current goals can now be found in the care plan section) Acute Rehab PT Goals Patient Stated Goal: go to [place], get married without using a walker PT Goal Formulation: With patient Time For Goal Achievement: 05/08/22 Potential to Achieve Goals: Good Progress towards PT goals: Progressing toward goals    Frequency    Min 5X/week      PT Plan Current plan remains appropriate    Co-evaluation              AM-PAC PT "6 Clicks" Mobility   Outcome Measure  Help needed turning from your back to your side while in a flat bed without using bedrails?: A Little Help needed moving from lying on your back to sitting on the side of a flat bed without using bedrails?: A Little Help needed moving to and from a bed to a chair (including a wheelchair)?: A Little Help needed standing up from a chair using your arms (e.g., wheelchair or bedside chair)?: A Little Help needed to walk in hospital room?: A Little Help needed climbing 3-5 steps with a railing? : A Lot 6 Click Score: 17    End of Session Equipment Utilized During Treatment: Gait belt;Left knee immobilizer Activity Tolerance: Patient tolerated treatment well Patient left: in chair;with chair alarm set;with call bell/phone within reach Nurse Communication: Mobility status PT Visit Diagnosis: Difficulty in walking, not elsewhere classified (R26.2);Pain Pain - Right/Left: Left Pain - part of body: Knee     Time: 1359-1414 PT Time Calculation (min) (ACUTE ONLY): 15 min  Charges:  $Gait Training: 8-22 mins                     Blondell Reveal Kistler PT 05/02/2022  Acute Rehabilitation Services  Office [phone]

## 2022-05-02 NOTE — Progress Notes (Signed)
During assessment, ABD pads from Hemovac were saturated with blood. Two bed pads also saturated in blood. Old ABD pads around Hemovac were removed. New ABD pads and tape applied for Hemovac reinforcement. Elizabeth Sauer, PA notified about saturated, unmeasured drainage. 55 ml were measured. Elizabeth Sauer, PA stated to reinforce with tegaderm, 4x4's, ABD pads, and tape if ABD pads saturate again. Elizabeth Sauer, PA stated she would let Dr. Sid Falcon team know.

## 2022-05-02 NOTE — Progress Notes (Signed)
Subjective: 2 Days Post-Op Procedure(s) (LRB): HARDWARE REMOVAL LEFT FEMUR (Left) DISTAL FEMUR REPLACEMENT (Left) Patient seen in rounds this morning for Dr. Lyla Glassing Patient reports pain as 7 on 0-10 scale.   Patient doing okay this a.m.  Denies any chest pain, shortness of breath or difficulty breathing Patient but has been having a lot of drainage in her drain at this time, especially overnight  Objective: Vital signs in last 24 hours: Temp:  [98.6 F (37 C)-99.5 F (37.5 C)] 99.5 F (37.5 C) (03/23 0544) Pulse Rate:  [69-86] 86 (03/23 0544) Resp:  [16-17] 16 (03/23 0544) BP: (111-146)/(55-74) 146/74 (03/23 0544) SpO2:  [91 %-100 %] 91 % (03/23 0544)  Intake/Output from previous day: 03/22 0701 - 03/23 0700 In: 1832.3 [P.O.:960; I.V.:872.3] Out: 445 [Urine:200; Drains:245] Intake/Output this shift: No intake/output data recorded.  Recent Labs    05/01/22 0330 05/02/22 0324  HGB 8.3* 7.0*   Recent Labs    05/01/22 0330 05/02/22 0324  WBC 8.0 6.7  RBC 2.87* 2.47*  HCT 26.4* 22.5*  PLT 189 172   Recent Labs    05/01/22 0330  NA 133*  K 3.6  CL 104  CO2 22  BUN 13  CREATININE 0.74  GLUCOSE 152*  CALCIUM 7.8*   No results for input(s): "LABPT", "INR" in the last 72 hours.  Neurologically intact Neurovascular intact Sensation intact distally Intact pulses distally Dorsiflexion/Plantar flexion intact Incision: dressing C/D/I Compartment soft   Assessment/Plan: 2 Days Post-Op Procedure(s) (LRB): HARDWARE REMOVAL LEFT FEMUR (Left) DISTAL FEMUR REPLACEMENT (Left) Up with therapy continue working with therapy, weightbearing as tolerated.  We are going to leave her drains and at this time because of the large output Her hemoglobin is 7.0, she is asymptomatic at this time.  She does tell me that her normal level of hemoglobin is usually around 7.  She does report that she has taken iron tablets in the past.  I am going to order some for her today.  If she  starts to become symptomatic then I would recommend a transfusion.   Nettie Elm [facility] [phone] 05/02/2022, 9:00 AM

## 2022-05-02 NOTE — Progress Notes (Signed)
Notified Elizabeth Sauer, PA about patient's excess drainage from ABD pads. Received verbal order for CBC/H&H. Bloody ABD pads removed. New guaze, tegaderm, ABD pads, and tape applied to Hemovac site.

## 2022-05-03 LAB — HEMOGLOBIN AND HEMATOCRIT, BLOOD
HCT: 26.9 % — ABNORMAL LOW (ref 36.0–46.0)
Hemoglobin: 8.7 g/dL — ABNORMAL LOW (ref 12.0–15.0)

## 2022-05-03 LAB — PREPARE RBC (CROSSMATCH)

## 2022-05-03 LAB — CBC WITH DIFFERENTIAL/PLATELET
Abs Immature Granulocytes: 0.01 10*3/uL (ref 0.00–0.07)
Basophils Absolute: 0 10*3/uL (ref 0.0–0.1)
Basophils Relative: 0 %
Eosinophils Absolute: 0.1 10*3/uL (ref 0.0–0.5)
Eosinophils Relative: 3 %
HCT: 22.2 % — ABNORMAL LOW (ref 36.0–46.0)
Hemoglobin: 6.8 g/dL — CL (ref 12.0–15.0)
Immature Granulocytes: 0 %
Lymphocytes Relative: 21 %
Lymphs Abs: 1.2 10*3/uL (ref 0.7–4.0)
MCH: 28.7 pg (ref 26.0–34.0)
MCHC: 30.6 g/dL (ref 30.0–36.0)
MCV: 93.7 fL (ref 80.0–100.0)
Monocytes Absolute: 0.7 10*3/uL (ref 0.1–1.0)
Monocytes Relative: 12 %
Neutro Abs: 3.6 10*3/uL (ref 1.7–7.7)
Neutrophils Relative %: 64 %
Platelets: 168 10*3/uL (ref 150–400)
RBC: 2.37 MIL/uL — ABNORMAL LOW (ref 3.87–5.11)
RDW: 14.3 % (ref 11.5–15.5)
WBC: 5.6 10*3/uL (ref 4.0–10.5)
nRBC: 0 % (ref 0.0–0.2)

## 2022-05-03 LAB — BASIC METABOLIC PANEL
Anion gap: 7 (ref 5–15)
BUN: 12 mg/dL (ref 8–23)
CO2: 25 mmol/L (ref 22–32)
Calcium: 8.6 mg/dL — ABNORMAL LOW (ref 8.9–10.3)
Chloride: 104 mmol/L (ref 98–111)
Creatinine, Ser: 0.52 mg/dL (ref 0.44–1.00)
GFR, Estimated: >60 mL/min (ref >60)
Glucose, Bld: 131 mg/dL — ABNORMAL HIGH (ref 70–99)
Potassium: 3.6 mmol/L (ref 3.5–5.1)
Sodium: 136 mmol/L (ref 135–145)

## 2022-05-03 MED ORDER — SODIUM CHLORIDE 0.9% IV SOLUTION: Freq: Once | INTRAVENOUS | Status: AC

## 2022-05-03 NOTE — Progress Notes (Signed)
   05/03/22 0420  Provider Notification  Provider Name/Title Elizabeth Sauer, PA  Date Provider Notified 05/03/22  Time Provider Notified 0425  Method of Notification Call  Notification Reason Critical Result  Test performed and critical result Hgb 6.8  Date Critical Result Received 05/03/22  Time Critical Result Received 0420  Provider response See new orders  Date of Provider Response 05/03/22  Time of Provider Response 0425

## 2022-05-03 NOTE — Progress Notes (Signed)
Subjective:  Patient reports pain as mild to moderate.  Denies N/V/CP/SOB/Abd pain. Currently denies any dizziness but states she had some slight dizziness last night. She reports that her pain has continued to improve.  We discussed this morning about transfusing 1 unit PRBCs.  She reports she has been doing well with PT.   Objective:   VITALS:   Vitals:   05/02/22 1349 05/02/22 2144 05/03/22 0630 05/03/22 1020  BP: 129/65 138/64 (!) 144/64 122/65  Pulse: 87 75 70 73  Resp: 20 18 18    Temp: 99 F (37.2 C) 99.2 F (37.3 C) 99.6 F (37.6 C)   TempSrc: Oral Oral Oral   SpO2: 97% 95% 92%   Weight:      Height:        Patient is lying in bed. NAD.  Neurologically intact ABD soft Neurovascular intact Sensation intact distally Intact pulses distally Dorsiflexion/Plantar flexion intact No cellulitis present Compartment soft Negative pressure dressing intact, no leaks detected. House vac on 75 mmHg. No drainage.  Hemovac drain 40 cc bloody fluid. Drainage from drain site, dressing changed, continue to reinforce as needed.   She can perform a straight leg raise.   Lab Results  Component Value Date   WBC 5.6 05/03/2022   HGB 6.8 (LL) 05/03/2022   HCT 22.2 (L) 05/03/2022   MCV 93.7 05/03/2022   PLT 168 05/03/2022   BMET    Component Value Date/Time   NA 136 05/03/2022 0842   K 3.6 05/03/2022 0842   CL 104 05/03/2022 0842   CO2 25 05/03/2022 0842   GLUCOSE 131 (H) 05/03/2022 0842   BUN 12 05/03/2022 0842   CREATININE 0.52 05/03/2022 0842   CREATININE 0.76 07/10/2020 1220   CALCIUM 8.6 (L) 05/03/2022 0842   GFRNONAA >60 05/03/2022 0842   GFRNONAA >60 07/10/2020 1220   Results for orders placed or performed during the hospital encounter of 04/30/22  Aerobic/Anaerobic Culture w Gram Stain (surgical/deep wound)     Status: None (Preliminary result)   Collection Time: 04/30/22  4:09 PM   Specimen: PATH Bone biopsy; Tissue  Result Value Ref Range Status   Specimen  Description   Final    BONE Performed at [facility] [address], [place], [place] [address]    Special Requests   Final    NONE Performed at [facility], [place] [address], [place], [place] [address]    Gram Stain NO WBC SEEN NO ORGANISMS SEEN   Final   Culture   Final    NO GROWTH 2 DAYS NO ANAEROBES ISOLATED; CULTURE IN PROGRESS FOR 5 DAYS Performed at [facility] [address] [place], [place] [address]    Report Status PENDING  Incomplete     Assessment/Plan: 3 Days Post-Op   Principal Problem:   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, left, with nonunion, subsequent encounter Active Problems:   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 She reported some dizziness last night. Hemoglobin 6.8 this morning. Transfusing 1 unit of PRBCs. Continue to monitor.   WBAT with walker DVT ppx: Aspirin, SCDs, TEDS PO pain control PT/OT: Patient ambulated 80 feet with PT yesterday. Continue PT today.  Dispo:  - Continue hemovac drain. Continue to reinforce dressing as needed.  - Negative pressure dressing intact no leaks detected.  - Upon discharge nurse to exchange house vac to portable prevena suction  unit. Will bring to the room today.    Charlott Rakes, PA-C 05/03/2022, 10:45 AM   [facility]  [place] [address], [place], [place] [address] Phone: [phone] [email or web address]  [employer]

## 2022-05-03 NOTE — Progress Notes (Signed)
Physical Therapy Treatment Patient Details Name: Tammy [name] MRN: [id number] DOB: 04/18/1955 Today's Date: 05/03/2022   History of Present Illness 67 y.o. 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 She is 11 months s/p left distal femur ORIF fracture 04/10/21 with Dr. Marcelino Scot. She has been unable to weightbear in the left leg. 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 s/p removal of implant and replacement of L distal femur 04/30/22. PMH: bipolar, CVA.    PT Comments    Pt reports performing exercises in bed today and just finished with one unit of PRBCs.  Pt very eager to mobilize and ambulated in hallway.  Pt also practiced safe stair technique with spouse observing.  Pt reports not wearing KI so did perform mobility today without KI in place and no buckling observed, also monitored frequently for bleeding and none observed on dressing or from drain site (pt has hemovac and wound vac in place).  Pt pleased with progress today.    Recommendations for follow up therapy are one component of a multi-disciplinary discharge planning process, led by the attending physician.  Recommendations may be updated based on patient status, additional functional criteria and insurance authorization.  Follow Up Recommendations  Follow physician's recommendations for discharge plan and follow up therapies     Assistance Recommended at Discharge Intermittent Supervision/Assistance  Patient can return home with the following A little help with walking and/or transfers;A little help with bathing/dressing/bathroom;Assist for transportation;Help with stairs or ramp for entrance;Assistance with cooking/housework   Equipment Recommendations  None recommended by  PT    Recommendations for Other Services       Precautions / Restrictions Precautions Precautions: Fall Precaution Comments: denies falls in past 6 months; KI when walking, can remove for ROM  (per notes but pt reports not wearing KI the past couple days, performed today without KI in place); hemovac, wound vac Knee Immobilizer - Left:  (per ortho PA progress note, KI can be removed for ROM, wear KI "until pt can extend knee herself") Restrictions Weight Bearing Restrictions: No LLE Weight Bearing: Weight bearing as tolerated     Mobility  Bed Mobility Overal bed mobility: Needs Assistance Bed Mobility: Supine to Sit     Supine to sit: Min guard     General bed mobility comments: assisted with line management    Transfers Overall transfer level: Needs assistance Equipment used: Rolling walker (2 wheels) Transfers: Sit to/from Stand Sit to Stand: Supervision                Ambulation/Gait Ambulation/Gait assistance: Supervision Gait Distance (Feet): 200 Feet Assistive device: Rolling walker (2 wheels) Gait Pattern/deviations: Step-to pattern, Decreased stance time - left, Antalgic       General Gait Details: cues for RW positioning, pt reports improvement in pain with ambulating, no increased bleeding observed (performed frequent checks of dressing during mobility)   Stairs Stairs: Yes Stairs assistance: Min guard Stair Management: One rail Left, With cane, Step to pattern, Forwards Number of Stairs: 4 General stair comments: verbal cues for sequence and safety; pt able to perform well, spouse observed   Wheelchair Mobility    Modified Rankin (Stroke Patients Only)       Balance  Cognition Arousal/Alertness: Awake/alert Behavior During Therapy: WFL for tasks assessed/performed Overall Cognitive Status: Within Functional Limits for tasks assessed                                           Exercises      General Comments        Pertinent Vitals/Pain Pain Assessment Pain Assessment: 0-10 Pain Score: 2  Pain Location: L knee Pain Descriptors / Indicators: Sore Pain Intervention(s): Repositioned, Monitored during session    Home Living                          Prior Function            PT Goals (current goals can now be found in the care plan section) Progress towards PT goals: Progressing toward goals    Frequency    Min 5X/week      PT Plan Current plan remains appropriate    Co-evaluation              AM-PAC PT "6 Clicks" Mobility   Outcome Measure  Help needed turning from your back to your side while in a flat bed without using bedrails?: A Little Help needed moving from lying on your back to sitting on the side of a flat bed without using bedrails?: A Little Help needed moving to and from a bed to a chair (including a wheelchair)?: A Little Help needed standing up from a chair using your arms (e.g., wheelchair or bedside chair)?: A Little Help needed to walk in hospital room?: A Little Help needed climbing 3-5 steps with a railing? : A Little 6 Click Score: 18    End of Session Equipment Utilized During Treatment: Gait belt Activity Tolerance: Patient tolerated treatment well Patient left: in chair;with call bell/phone within reach;with chair alarm set;with family/visitor present Nurse Communication: Mobility status PT Visit Diagnosis: Difficulty in walking, not elsewhere classified (R26.2)     Time: [id number] PT Time Calculation (min) (ACUTE ONLY): 26 min  Charges:  $Gait Training: 23-37 mins                     Jannette Spanner PT, DPT Physical Therapist Acute Rehabilitation Services Preferred contact method: Secure Chat Weekend Pager Only: [phone] Office: [phone]    Myrtis Hopping [place] 05/03/2022, 5:06 PM

## 2022-05-03 NOTE — Plan of Care (Signed)
  Problem: Education: Goal: Knowledge of General Education information will improve Description Including pain rating scale, medication(s)/side effects and non-pharmacologic comfort measures Outcome: Progressing   

## 2022-05-04 LAB — CBC
HCT: 27.2 % — ABNORMAL LOW (ref 36.0–46.0)
Hemoglobin: 8.8 g/dL — ABNORMAL LOW (ref 12.0–15.0)
MCH: 29.5 pg (ref 26.0–34.0)
MCHC: 32.4 g/dL (ref 30.0–36.0)
MCV: 91.3 fL (ref 80.0–100.0)
Platelets: 184 10*3/uL (ref 150–400)
RBC: 2.98 MIL/uL — ABNORMAL LOW (ref 3.87–5.11)
RDW: 14.2 % (ref 11.5–15.5)
WBC: 6.1 10*3/uL (ref 4.0–10.5)
nRBC: 0 % (ref 0.0–0.2)

## 2022-05-04 LAB — BPAM RBC
Blood Product Expiration Date: 202404182359
ISSUE DATE / TIME: 202403241211
Unit Type and Rh: 6200

## 2022-05-04 LAB — TYPE AND SCREEN
ABO/RH(D): AB POS
Antibody Screen: NEGATIVE
Unit division: 0

## 2022-05-04 LAB — BASIC METABOLIC PANEL
Anion gap: 10 (ref 5–15)
BUN: 18 mg/dL (ref 8–23)
CO2: 25 mmol/L (ref 22–32)
Calcium: 8.3 mg/dL — ABNORMAL LOW (ref 8.9–10.3)
Chloride: 103 mmol/L (ref 98–111)
Creatinine, Ser: 0.67 mg/dL (ref 0.44–1.00)
GFR, Estimated: >60 mL/min (ref >60)
Glucose, Bld: 100 mg/dL — ABNORMAL HIGH (ref 70–99)
Potassium: 3.4 mmol/L — ABNORMAL LOW (ref 3.5–5.1)
Sodium: 138 mmol/L (ref 135–145)

## 2022-05-04 NOTE — Plan of Care (Signed)
Plan of care reviewed and discussed. Problem: Education: Goal: Knowledge of General Education information will improve Description: Including pain rating scale, medication(s)/side effects and non-pharmacologic comfort measures Outcome: Adequate for Discharge   Problem: Health Behavior/Discharge Planning: Goal: Ability to manage health-related needs will improve Outcome: Adequate for Discharge   Problem: Clinical Measurements: Goal: Ability to maintain clinical measurements within normal limits will improve Outcome: Adequate for Discharge Goal: Will remain free from infection Outcome: Progressing Goal: Diagnostic test results will improve Outcome: Adequate for Discharge Goal: Respiratory complications will improve Outcome: Completed/Met Goal: Cardiovascular complication will be avoided Outcome: Completed/Met

## 2022-05-04 NOTE — Progress Notes (Signed)
    Subjective:  Patient reports pain as mild to moderate.  Denies N/V/CP/SOB/Abd pain. She reports her pain has continued to improve. She denies any tingling or numbness in LE bilaterally. She is eager to d/c home. We discussed still limiting factor is her hemovac drain output. She understands.   Objective:   VITALS:   Vitals:   05/03/22 2118 05/04/22 0545 05/04/22 0931 05/04/22 1317  BP: 137/63 (!) 156/72 (!) 159/67 138/71  Pulse: 65 64 66 66  Resp: 18 18  16   Temp: 98.7 F (37.1 C) 98.4 F (36.9 C)  98.3 F (36.8 C)  TempSrc: Oral   Oral  SpO2: 98% 99%  100%  Weight:      Height:        Patient lying comfortably in bed. NAD.  Neurologically intact ABD soft Neurovascular intact Sensation intact distally Intact pulses distally Dorsiflexion/Plantar flexion intact No cellulitis present Compartment soft Negative pressure dressing intact, no leaks detected. House vac on 75 mmHg. No drainage.  Hemovac drain 50 cc bloody fluid. Drainage from drain site, continue to reinforce as needed.   She can perform a straight leg raise.   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 Hemoglobin 8.8 this  morning, s/p 1 unit PRBC yesterday. Continue to monitor.   Hypokalemia. K+ 3.4. Scheduled potassium today. Will recheck tomorrow.   WBAT with walker DVT ppx: Aspirin, SCDs, TEDS PO pain control PT/OT: She ambulated 200 feet with PT yesterday.  Dispo: - Continue hemovac drain. Continue to reinforce dressing as needed.  - Negative pressure dressing intact no leaks detected.  - Upon discharge nurse to exchange house vac to portable prevena suction unit.     Charlott Rakes, PA-C 05/04/2022, 3:31 PM   [facility]  [facility] [address], [place], [place] [address] Phone: [phone] [email or web address] Facebook  [employer]

## 2022-05-04 NOTE — Progress Notes (Signed)
Physical Therapy Treatment Patient Details Name: Tammy [name] MRN: [id number] DOB: January 17, 1956 Today's Date: 05/04/2022   History of Present Illness 66 y.o. 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 She is 11 months s/p left distal femur ORIF fracture 04/10/21 with Dr. Marcelino Scot. She has been unable to weightbear in the left leg. 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 s/p removal of implant and replacement of L distal femur 04/30/22. PMH: bipolar, CVA.    PT Comments    Pt assisted to bathroom and then ambulated in hallway.  Pt's dressing with old drainage and no new bleeding observed after session.  Pt reports she performed LE exercises already this morning and has been ambulating to/from bathroom.  Pt denies any dizziness with mobility.  Pt reports possible d/c home tomorrow.     Recommendations for follow up therapy are one component of a multi-disciplinary discharge planning process, led by the attending physician.  Recommendations may be updated based on patient status, additional functional criteria and insurance authorization.  Follow Up Recommendations       Assistance Recommended at Discharge Intermittent Supervision/Assistance  Patient can return home with the following A little help with walking and/or transfers;A little help with bathing/dressing/bathroom;Assist for transportation;Help with stairs or ramp for entrance;Assistance with cooking/housework   Equipment Recommendations  None recommended by PT    Recommendations for Other Services       Precautions / Restrictions Precautions Precautions: Fall Precaution Comments: hemovac, wound vac, pt able to perform SLR Restrictions LLE Weight Bearing: Weight bearing as  tolerated     Mobility  Bed Mobility Overal bed mobility: Needs Assistance Bed Mobility: Supine to Sit     Supine to sit: Supervision     General bed mobility comments: assisted with drain management    Transfers Overall transfer level: Needs assistance Equipment used: Rolling walker (2 wheels) Transfers: Sit to/from Stand Sit to Stand: Supervision           General transfer comment: VCs hand placement    Ambulation/Gait Ambulation/Gait assistance: Supervision Gait Distance (Feet): 80 Feet Assistive device: Rolling walker (2 wheels) Gait Pattern/deviations: Step-to pattern, Decreased stance time - left, Antalgic Gait velocity: decr     General Gait Details: cues for RW positioning, step length   Stairs             Wheelchair Mobility    Modified Rankin (Stroke Patients Only)       Balance                                            Cognition Arousal/Alertness: Awake/alert Behavior During Therapy: WFL for tasks assessed/performed Overall Cognitive Status: Within Functional Limits for tasks assessed                                          Exercises      General Comments        Pertinent Vitals/Pain Pain Assessment Pain Assessment: 0-10 Pain Score: 2  Pain Location: L knee Pain Descriptors / Indicators: Sore Pain  Intervention(s): Repositioned, Premedicated before session, Monitored during session    Home Living                          Prior Function            PT Goals (current goals can now be found in the care plan section)      Frequency    Min 5X/week      PT Plan Current plan remains appropriate    Co-evaluation              AM-PAC PT "6 Clicks" Mobility   Outcome Measure  Help needed turning from your back to your side while in a flat bed without using bedrails?: A Little Help needed moving from lying on your back to sitting on the side of a flat bed without  using bedrails?: A Little Help needed moving to and from a bed to a chair (including a wheelchair)?: A Little Help needed standing up from a chair using your arms (e.g., wheelchair or bedside chair)?: A Little Help needed to walk in hospital room?: A Little Help needed climbing 3-5 steps with a railing? : A Little 6 Click Score: 18    End of Session Equipment Utilized During Treatment: Gait belt Activity Tolerance: Patient tolerated treatment well Patient left: in chair;with call bell/phone within reach;with chair alarm set;with family/visitor present Nurse Communication: Mobility status PT Visit Diagnosis: Difficulty in walking, not elsewhere classified (R26.2)     Time: [id number] PT Time Calculation (min) (ACUTE ONLY): 17 min  Charges:  $Gait Training: 8-22 mins                    Arlyce Dice, DPT Physical Therapist Acute Rehabilitation Services Office: [place] 05/04/2022, 2:09 PM

## 2022-05-04 NOTE — Plan of Care (Signed)

## 2022-05-05 ENCOUNTER — Ambulatory Visit: Payer: Medicare Other | Admitting: Physical Therapy

## 2022-05-05 LAB — CBC
HCT: 30 % — ABNORMAL LOW (ref 36.0–46.0)
Hemoglobin: 9.5 g/dL — ABNORMAL LOW (ref 12.0–15.0)
MCH: 29.1 pg (ref 26.0–34.0)
MCHC: 31.7 g/dL (ref 30.0–36.0)
MCV: 91.7 fL (ref 80.0–100.0)
Platelets: 213 10*3/uL (ref 150–400)
RBC: 3.27 MIL/uL — ABNORMAL LOW (ref 3.87–5.11)
RDW: 14.1 % (ref 11.5–15.5)
WBC: 5.9 10*3/uL (ref 4.0–10.5)
nRBC: 0 % (ref 0.0–0.2)

## 2022-05-05 LAB — AEROBIC/ANAEROBIC CULTURE W GRAM STAIN (SURGICAL/DEEP WOUND)
Culture: NO GROWTH
Gram Stain: NONE SEEN

## 2022-05-05 LAB — BASIC METABOLIC PANEL
Anion gap: 9 (ref 5–15)
BUN: 12 mg/dL (ref 8–23)
CO2: 27 mmol/L (ref 22–32)
Calcium: 9.1 mg/dL (ref 8.9–10.3)
Chloride: 104 mmol/L (ref 98–111)
Creatinine, Ser: 0.58 mg/dL (ref 0.44–1.00)
GFR, Estimated: >60 mL/min (ref >60)
Glucose, Bld: 108 mg/dL — ABNORMAL HIGH (ref 70–99)
Potassium: 3.6 mmol/L (ref 3.5–5.1)
Sodium: 140 mmol/L (ref 135–145)

## 2022-05-05 MED ORDER — DOCUSATE SODIUM 100 MG PO CAPS: 100.0000 mg | ORAL_CAPSULE | Freq: Two times a day (BID) | ORAL | 0 refills | Status: DC

## 2022-05-05 MED ORDER — POLYETHYLENE GLYCOL 3350 17 G PO PACK: 17.0000 g | PACK | Freq: Every day | ORAL | 0 refills | Status: DC | PRN

## 2022-05-05 MED ORDER — SENNA 8.6 MG PO TABS: 2.0000 | ORAL_TABLET | Freq: Every day | ORAL | 0 refills | Status: AC

## 2022-05-05 MED ORDER — ASPIRIN 81 MG PO CHEW: 81.0000 mg | CHEWABLE_TABLET | Freq: Two times a day (BID) | ORAL | 0 refills | Status: DC

## 2022-05-05 MED ORDER — MELOXICAM 15 MG PO TABS: 15.0000 mg | ORAL_TABLET | Freq: Every day | ORAL | 2 refills | Status: DC

## 2022-05-05 MED ORDER — OXYCODONE HCL 5 MG PO TABS: 5.0000 mg | ORAL_TABLET | Freq: Four times a day (QID) | ORAL | 0 refills | Status: AC | PRN

## 2022-05-05 MED ORDER — ONDANSETRON HCL 4 MG PO TABS: 4.0000 mg | ORAL_TABLET | Freq: Three times a day (TID) | ORAL | 0 refills | Status: DC | PRN

## 2022-05-05 NOTE — Progress Notes (Addendum)
    Subjective:  Patient reports pain as mild to moderate.  Denies N/V/CP/SOB/Abd pain. She reports her pain has continued to improve. She denies any tingling or numbness in LE bilaterally. She is eager to d/c home. We discussed still limiting factor is her hemovac drain output. She understands.   Objective:   VITALS:   Vitals:   05/04/22 0931 05/04/22 1317 05/04/22 2050 05/05/22 0537  BP: (!) 159/67 138/71 134/66 (!) 166/74  Pulse: 66 66 68 (!) 58  Resp:  16 18 17   Temp:  98.3 F (36.8 C) 98.3 F (36.8 C) 98.2 F (36.8 C)  TempSrc:  Oral Oral Oral  SpO2:  100% 96% 99%  Weight:      Height:        Patient lying comfortably in bed. NAD.  Neurologically intact ABD soft Neurovascular intact Sensation intact distally Intact pulses distally Dorsiflexion/Plantar flexion intact No cellulitis present Compartment soft Negative pressure dressing intact, no leaks detected. House vac on 75 mmHg. No drainage.  Hemovac drain 30 cc bloody fluid. Drainage from drain site, continue to reinforce as needed.   She can perform a straight leg raise.   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 Hemoglobin 8.8  yesterday, s/p 1 unit PRBC yesterday. New labs ordered this morning.    Hypokalemia. K+ 3.4 yesterday. Scheduled potassium. New labs ordered this morning.    WBAT with walker DVT ppx: Aspirin, SCDs, TEDS PO pain control PT/OT: She ambulated 80 feet with PT yesterday.  Dispo: - Remove hemovac drain. Continue to reinforce dressing as needed.  - Negative pressure dressing intact no leaks detected.  - Upon discharge nurse to exchange house vac to portable prevena suction unit.    Charlott Rakes, PA-C 05/05/2022, 8:05 AM   [facility]  [facility] [address], [address], [place], [place] [address] Phone: [phone] [email or web address] Facebook  [employer]

## 2022-05-05 NOTE — Care Management Important Message (Signed)
Important Message  Patient Details IM Letter given. Name: Tammy [name] MRN: [id number] Date of Birth: 1955-06-23   Medicare Important Message Given:  Yes     Kerin Salen 05/05/2022, 11:32 AM

## 2022-05-05 NOTE — Progress Notes (Signed)
Physical Therapy Treatment Patient Details Name: Tammy [name] MRN: [id number] DOB: [birth year] Today's Date: 05/05/2022   History of Present Illness 67 y.o. 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 She is 11 months s/p left distal femur ORIF fracture 04/10/21 with Dr. Marcelino Scot. She has been unable to weightbear in the left leg. 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 s/p removal of implant and replacement of L distal femur 04/30/22. PMH: bipolar, CVA.    PT Comments    Pt ambulated 66' with RW with no loss of balance, no buckling of LLE. She is able to perform a SLR independently with LLE. Reviewed HEP. Pt is progressing well. No saturation of dressing at drainage site at end of session.     Recommendations for follow up therapy are one component of a multi-disciplinary discharge planning process, led by the attending physician.  Recommendations may be updated based on patient status, additional functional criteria and insurance authorization.  Follow Up Recommendations       Assistance Recommended at Discharge Intermittent Supervision/Assistance  Patient can return home with the following A little help with bathing/dressing/bathroom;Assist for transportation;Help with stairs or ramp for entrance;Assistance with cooking/housework   Equipment Recommendations  None recommended by PT    Recommendations for Other Services       Precautions / Restrictions Precautions Precautions: Fall Precaution Comments: hemovac, wound vac, pt able to perform SLR Restrictions LLE Weight Bearing: Weight bearing as tolerated     Mobility  Bed Mobility Overal bed mobility: Modified Independent       Supine to sit: Modified independent  (Device/Increase time), HOB elevated     General bed mobility comments: used bedrail    Transfers Overall transfer level: Needs assistance Equipment used: Rolling walker (2 wheels) Transfers: Sit to/from Stand Sit to Stand: Supervision           General transfer comment: VCs hand placement    Ambulation/Gait Ambulation/Gait assistance: Supervision Gait Distance (Feet): 90 Feet Assistive device: Rolling walker (2 wheels) Gait Pattern/deviations: Step-to pattern, Decreased stance time - left, Antalgic Gait velocity: decr     General Gait Details: no buckling of LLE without KI, no loss of balance   Stairs             Wheelchair Mobility    Modified Rankin (Stroke Patients Only)       Balance Overall balance assessment: Modified Independent                                          Cognition Arousal/Alertness: Awake/alert Behavior During Therapy: WFL for tasks assessed/performed Overall Cognitive Status: Within Functional Limits for tasks assessed                                          Exercises Total Joint Exercises Knee Flexion: AROM, Left, 10 reps, Seated Goniometric ROM: ~5-60* AAROM L knee General Exercises - Lower Extremity Ankle Circles/Pumps: AROM, Both, 10 reps, Supine Quad Sets: AROM, Both, 5 reps, Supine Short Arc Quad: AROM, Left, 10 reps, Supine Long Arc  Quad: AROM, Left, 5 reps, Seated Hip ABduction/ADduction: AAROM, Left, 5 reps, Supine Straight Leg Raises: AROM, Left, 10 reps, Supine    General Comments        Pertinent Vitals/Pain Pain Assessment Pain Score: 2  Pain Descriptors / Indicators: Sore Pain Intervention(s): Limited activity within patient's tolerance, Monitored during session, Repositioned, Premedicated before session (pt declined ice)    Home Living                          Prior Function            PT Goals (current goals can now be found in the care plan  section) Acute Rehab PT Goals Patient Stated Goal: go to [place], get married without using a walker PT Goal Formulation: With patient Time For Goal Achievement: 05/08/22 Potential to Achieve Goals: Good Progress towards PT goals: Progressing toward goals    Frequency    Min 5X/week      PT Plan Current plan remains appropriate    Co-evaluation              AM-PAC PT "6 Clicks" Mobility   Outcome Measure  Help needed turning from your back to your side while in a flat bed without using bedrails?: None Help needed moving from lying on your back to sitting on the side of a flat bed without using bedrails?: A Little Help needed moving to and from a bed to a chair (including a wheelchair)?: None Help needed standing up from a chair using your arms (e.g., wheelchair or bedside chair)?: None Help needed to walk in hospital room?: None Help needed climbing 3-5 steps with a railing? : A Little 6 Click Score: 22    End of Session Equipment Utilized During Treatment: Gait belt Activity Tolerance: Patient tolerated treatment well Patient left: in chair;with call bell/phone within reach;with chair alarm set;with family/visitor present Nurse Communication: Mobility status PT Visit Diagnosis: Difficulty in walking, not elsewhere classified (R26.2) Pain - Right/Left: Left Pain - part of body: Knee     Time: [id number] PT Time Calculation (min) (ACUTE ONLY): 24 min  Charges:  $Gait Training: 8-22 mins $Therapeutic Exercise: 8-22 mins                    Blondell Reveal Kistler PT 05/05/2022  [place]  Office [phone]

## 2022-05-05 NOTE — Discharge Instructions (Signed)
Dr. Rod Can Total Joint Specialist [facility] [address], [place], [place] [address] [phone]  TOTAL KNEE REPLACEMENT POSTOPERATIVE DIRECTIONS    Knee Rehabilitation, Guidelines Following Surgery  Results after knee surgery are often greatly improved when you follow the exercise, range of motion and muscle strengthening exercises prescribed by your doctor. Safety measures are also important to protect the knee from further injury. Any time any of these exercises cause you to have increased pain or swelling in your knee joint, decrease the amount until you are comfortable again and slowly increase them. If you have problems or questions, call your caregiver or physical therapist for advice.   WEIGHT BEARING Weight bearing as tolerated with assist device (walker, cane, etc) as directed, use it as long as suggested by your surgeon or therapist, typically at least 4-6 weeks.  HOME CARE INSTRUCTIONS  Remove items at home which could result in a fall. This includes throw rugs or furniture in walking pathways.  Continue medications as instructed at time of discharge. You may have some home medications which will be placed on hold until you complete the course of blood thinner medication.  Walk with walker as instructed.  You may resume a sexual relationship in one month or when given the OK by your doctor.  Use walker as long as suggested by your caregivers. Avoid periods of inactivity such as sitting longer than an hour when not asleep. This helps prevent blood clots.  You may put full weight on your legs and walk as much as is comfortable.  You may return to work once you are cleared by your doctor.  Do not drive a car for 6 weeks or until released by you surgeon.  Do not drive while taking narcotics.  Wear the elastic stockings for three weeks following surgery during the day but you may remove then at night. Make sure you keep all of your  appointments after your operation with all of your doctors and caregivers. You should call the office at the above phone number and make an appointment for approximately two weeks after the date of your surgery. Do not remove your surgical dressing. The dressing is waterproof; you may take showers in 3 days, but do not take tub baths or submerge the dressing. Please pick up a stool softener and laxative for home use as long as you are requiring pain medications. ICE to the affected knee every three hours for 30 minutes at a time and then as needed for pain and swelling.  Continue to use ice on the knee for pain and swelling from surgery. You may notice swelling that will progress down to the foot and ankle.  This is normal after surgery.  Elevate the leg when you are not up walking on it.   It is important for you to complete the blood thinner medication as prescribed by your doctor. Continue to use the breathing machine which will help keep your temperature down.  It is common for your temperature to cycle up and down following surgery, especially at night when you are not up moving around and exerting yourself.  The breathing machine keeps your lungs expanded and your temperature down.  RANGE OF MOTION AND STRENGTHENING EXERCISES  Rehabilitation of the knee is important following a knee injury or an operation. After just a few days of immobilization, the muscles of the thigh which control the knee become weakened and shrink (atrophy). Knee exercises are designed to build up the  tone and strength of the thigh muscles and to improve knee motion. Often times heat used for twenty to thirty minutes before working out will loosen up your tissues and help with improving the range of motion but do not use heat for the first two weeks following surgery. These exercises can be done on a training (exercise) mat, on the floor, on a table or on a bed. Use what ever works the best and is most comfortable for you Knee  exercises include:  Leg Lifts - While your knee is still immobilized in a splint or cast, you can do straight leg raises. Lift the leg to 60 degrees, hold for 3 sec, and slowly lower the leg. Repeat 10-20 times 2-3 times daily. Perform this exercise against resistance later as your knee gets better.  Quad and Hamstring Sets - Tighten up the muscle on the front of the thigh (Quad) and hold for 5-10 sec. Repeat this 10-20 times hourly. Hamstring sets are done by pushing the foot backward against an object and holding for 5-10 sec. Repeat as with quad sets.  A rehabilitation program following serious knee injuries can speed recovery and prevent re-injury in the future due to weakened muscles. Contact your doctor or a physical therapist for more information on knee rehabilitation.   POST-OPERATIVE OPIOID TAPER INSTRUCTIONS: It is important to wean off of your opioid medication as soon as possible. If you do not need pain medication after your surgery it is ok to stop day one. Opioids include: Codeine, Hydrocodone(Norco, Vicodin), Oxycodone(Percocet, oxycontin) and hydromorphone amongst others.  Long term and even short term use of opiods can cause: Increased pain response Dependence Constipation Depression Respiratory depression And more.  Withdrawal symptoms can include Flu like symptoms Nausea, vomiting And more Techniques to manage these symptoms Hydrate well Eat regular healthy meals Stay active Use relaxation techniques(deep breathing, meditating, yoga) Do Not substitute Alcohol to help with tapering If you have been on opioids for less than two weeks and do not have pain than it is ok to stop all together.  Plan to wean off of opioids This plan should start within one week post op of your joint replacement. Maintain the same interval or time between taking each dose and first decrease the dose.  Cut the total daily intake of opioids by one tablet each day Next start to increase the  time between doses. The last dose that should be eliminated is the evening dose.    SKILLED REHAB INSTRUCTIONS: If the patient is transferred to a skilled rehab facility following release from the hospital, a list of the current medications will be sent to the facility for the patient to continue.  When discharged from the skilled rehab facility, please have the facility set up the patient's [place] prior to being released. Also, the skilled facility will be responsible for providing the patient with their medications at time of release from the facility to include their pain medication, the muscle relaxants, and their blood thinner medication. If the patient is still at the rehab facility at time of the two week follow up appointment, the skilled rehab facility will also need to assist the patient in arranging follow up appointment in our office and any transportation needs.  MAKE SURE YOU:  Understand these instructions.  Will watch your condition.  Will get help right away if you are not doing well or get worse.    Pick up stool softner and laxative for home use following  surgery while on pain medications. Do NOT remove your incisional dressing. Do not take tub baths or submerge incision under water. Keep dressing clean and dry.  Charge your prevena wound vac nightly. Make sure it stays charged.  Continue to use ice for pain and swelling after surgery. Do not use any lotions or creams on the incision until instructed by your surgeon. Change hemovac drain site dressing as needed with ABD pad and dry gauze. It may drain for several days after removal.

## 2022-05-05 NOTE — Discharge Summary (Signed)
Physician Discharge Summary  Patient ID: Tammy [name] MRN: [id number] DOB/AGE: Jan 19, 1956 67 y.o.  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)    from hep c   Neuromuscular disorder ([place])    Osteopenia    Stroke ([place]) 09/2012   slurred speech and LEFT sided weakness/notes 10/04/2012    Surgeries: Procedure(s): HARDWARE REMOVAL LEFT FEMUR DISTAL FEMUR REPLACEMENT on 04/30/2022   Consultants (if any):   Discharged Condition: Improved  Hospital Course: Tammy [name] is an 67 y.o. 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    She was given perioperative antibiotics:  Anti-infectives (From admission, onward)    Start     Dose/Rate Route Frequency Ordered Stop   04/30/22 2030  ceFAZolin (ANCEF) IVPB 2g/100 mL premix        2 g 200 mL/hr over 30 Minutes Intravenous  Every 6 hours 04/30/22 1935 05/01/22 0311   04/30/22 1015  ceFAZolin (ANCEF) IVPB 2g/100 mL premix        2 g 200 mL/hr over 30 Minutes Intravenous On call to O.R. 04/30/22 1010 04/30/22 1434       She was given sequential compression devices, early ambulation, and aspirin for DVT prophylaxis.  POD#1 Patient in [place] and negative pressure incisional dressing on house vac suction unit 72mmHg. Hemovac drain with 253 cc bloody fluid dressing reinforced dye to drainage. Patient ABLA hemoglobin 8.3 monitored. She ambulated 36 feet with PT.  POD#2. KI removed as she has full knee extension. Negative pressure dressing continued. Hemovac drain with large amount of fluid. Hemoglobin 7.0, asymptomatic, patient has long standing history of low hemoglobin. She ambulated 20 and 80 feet with PT.  POD#3 Negative pressure dressing continued. Hemovac drain with 40cc fluid, dressing changed and reinforced. Hemoglobin critical at 6.8 she was transfused with 1 unit PRBCs. She ambulated 200 feet with PT.  POD#4 Negative pressure dressing continued. Hemovac drain with 50 cc fluid per shift. Hemoglobin 8.8 this morning, s/p 1 unit PRBC yesterday. She ambulated 80 feet with PT.  POD#5 Negative pressure dressing continued. Hemovac drain with 30 cc fluid. Hemoglobin 9.5 this morning.  She ambulated 90 and 100 feet with PT.  Removed hemovac drain. Continued to reinforce dressing as needed.  Nurse to exchange house vac to portable prevena suction unit and she was discharged home. Patient  to follow-up in office within 7 days for removal of negative pressure dressing.   She benefited maximally from the hospital stay and there were no complications.    Recent vital signs:  Vitals:   05/05/22 0838 05/05/22 1312  BP: (!) 153/76 (!) 133/59  Pulse: 61 71  Resp: 18 18  Temp: 98.2 F (36.8 C) 98.4 F (36.9 C)  SpO2: 98% 100%    Recent laboratory studies:  Lab Results  Component Value Date   HGB 9.5 (L) 05/05/2022   HGB  8.8 (L) 05/04/2022   HGB 8.7 (L) 05/03/2022   Lab Results  Component Value Date   WBC 5.9 05/05/2022   PLT 213 05/05/2022   Lab Results  Component Value Date   INR 1.1 03/17/2022   Lab Results  Component Value Date   NA 140 05/05/2022   K 3.6 05/05/2022   CL 104 05/05/2022   CO2 27 05/05/2022   BUN 12 05/05/2022   CREATININE 0.58 05/05/2022   GLUCOSE 108 (H) 05/05/2022     Allergies as of 05/05/2022       Reactions   Lisinopril Cough        Medication List     STOP taking these medications    HYDROcodone-acetaminophen 10-325 MG tablet Commonly known as: NORCO       TAKE these medications    acetaminophen 500 MG tablet Commonly known as: TYLENOL Take 1,000 mg by mouth every 6 (six) hours as needed for moderate pain.   amLODipine 10 MG tablet Commonly known as: NORVASC Take 1 tablet (10 mg total) by mouth daily.   anastrozole 1 MG tablet Commonly known as: ARIMIDEX TAKE 1 TABLET BY MOUTH DAILY   aspirin 81 MG chewable tablet Commonly known as: Aspirin Childrens Chew 1 tablet (81 mg total) by mouth 2 (two) times daily with a meal.   BENGAY EX Apply 1 Application topically daily as needed (pain).   buPROPion 150 MG 12 hr tablet Commonly known as: WELLBUTRIN SR Take 150 mg by mouth 2 (two) times daily.   CALCIUM PO Take 1 tablet by mouth daily.   docusate sodium 100 MG capsule Commonly known as: Colace Take 1 capsule (100 mg total) by mouth 2 (two) times daily. What changed:  when to take this reasons to take this   gabapentin 100 MG capsule Commonly known as: NEURONTIN Take 1 capsule (100 mg total) by mouth 2 (two) times daily. What changed: when to take this   gabapentin 300 MG capsule Commonly known as: NEURONTIN Take 300 mg by mouth at bedtime. What changed: Another medication with the same name was changed. Make sure you understand how and when to take each.   hydrALAZINE 25 MG tablet Commonly known as: APRESOLINE Take 1 tablet  (25 mg total) by mouth 2 (two) times daily.   meloxicam 15 MG tablet Commonly known as: MOBIC Take 1 tablet (15 mg total) by mouth daily.   Multivitamin Tabs Take 1 tablet by mouth daily.   ondansetron 4 MG tablet Commonly known as: Zofran Take 1 tablet (4 mg total) by mouth every 8 (eight) hours as needed for nausea or vomiting.   oxyCODONE 5 MG immediate release tablet Commonly known as: Roxicodone Take 1-2 tablets (5-10 mg total) by mouth every 6 (six) hours as needed for up to 7 days for severe pain or moderate pain.   pantoprazole 40 MG tablet Commonly known as: PROTONIX Take 1 tablet (40 mg total) by mouth daily.  polyethylene glycol 17 g packet Commonly known as: MiraLax Take 17 g by mouth daily as needed for mild constipation or moderate constipation.   potassium chloride 10 MEQ tablet Commonly known as: KLOR-CON Take 10 mEq by mouth daily.   senna 8.6 MG Tabs tablet Commonly known as: SENOKOT Take 2 tablets (17.2 mg total) by mouth at bedtime for 15 days.   Vitamin D 50 MCG (2000 UT) Caps Take 2,000 Units by mouth daily.               Discharge Care Instructions  (From admission, onward)           Start     Ordered   05/05/22 0000  Weight bearing as tolerated        05/05/22 1328   05/05/22 0000  Change dressing       Comments: Do not remove your dressing.   05/05/22 1328              WEIGHT BEARING   Weight bearing as tolerated with assist device (walker, cane, etc) as directed, use it as long as suggested by your surgeon or therapist, typically at least 4-6 weeks.   EXERCISES  Results after joint replacement surgery are often greatly improved when you follow the exercise, range of motion and muscle strengthening exercises prescribed by your doctor. Safety measures are also important to protect the joint from further injury. Any time any of these exercises cause you to have increased pain or swelling, decrease what you are doing until  you are comfortable again and then slowly increase them. If you have problems or questions, call your caregiver or physical therapist for advice.   Rehabilitation is important following a joint replacement. After just a few days of immobilization, the muscles of the leg can become weakened and shrink (atrophy).  These exercises are designed to build up the tone and strength of the thigh and leg muscles and to improve motion. Often times heat used for twenty to thirty minutes before working out will loosen up your tissues and help with improving the range of motion but do not use heat for the first two weeks following surgery (sometimes heat can increase post-operative swelling).   These exercises can be done on a training (exercise) mat, on the floor, on a table or on a bed. Use whatever works the best and is most comfortable for you.    Use music or television while you are exercising so that the exercises are a pleasant break in your day. This will make your life better with the exercises acting as a break in your routine that you can look forward to.   Perform all exercises about fifteen times, three times per day or as directed.  You should exercise both the operative leg and the other leg as well.  Exercises include:   Quad Sets - Tighten up the muscle on the front of the thigh (Quad) and hold for 5-10 seconds.   Straight Leg Raises - With your knee straight (if you were given a brace, keep it on), lift the leg to 60 degrees, hold for 3 seconds, and slowly lower the leg.  Perform this exercise against resistance later as your leg gets stronger.  Leg Slides: Lying on your back, slowly slide your foot toward your buttocks, bending your knee up off the floor (only go as far as is comfortable). Then slowly slide your foot back down until your leg is flat on the floor again.  Angel Wings: Lying on your back spread your legs to the side as far apart as you can without causing discomfort.  Hamstring  Strength:  Lying on your back, push your heel against the floor with your leg straight by tightening up the muscles of your buttocks.  Repeat, but this time bend your knee to a comfortable angle, and push your heel against the floor.  You may put a pillow under the heel to make it more comfortable if necessary.   A rehabilitation program following joint replacement surgery can speed recovery and prevent re-injury in the future due to weakened muscles. Contact your doctor or a physical therapist for more information on knee rehabilitation.    CONSTIPATION  Constipation is defined medically as fewer than three stools per week and severe constipation as less than one stool per week.  Even if you have a regular bowel pattern at home, your normal regimen is likely to be disrupted due to multiple reasons following surgery.  Combination of anesthesia, postoperative narcotics, change in appetite and fluid intake all can affect your bowels.   YOU MUST use at least one of the following options; they are listed in order of increasing strength to get the job done.  They are all available over the counter, and you may need to use some, POSSIBLY even all of these options:    Drink plenty of fluids (prune juice may be helpful) and high fiber foods Colace 100 mg by mouth twice a day  Senokot for constipation as directed and as needed Dulcolax (bisacodyl), take with full glass of water  Miralax (polyethylene glycol) once or twice a day as needed.  If you have tried all these things and are unable to have a bowel movement in the first 3-4 days after surgery call either your surgeon or your primary doctor.    If you experience loose stools or diarrhea, hold the medications until you stool forms back up.  If your symptoms do not get better within 1 week or if they get worse, check with your doctor.  If you experience "the worst abdominal pain ever" or develop nausea or vomiting, please contact the office immediately  for further recommendations for treatment.   ITCHING:  If you experience itching with your medications, try taking only a single pain pill, or even half a pain pill at a time.  You can also use Benadryl over the counter for itching or also to help with sleep.   TED HOSE STOCKINGS:  Use stockings on both legs until for at least 2 weeks or as directed by physician office. They may be removed at night for sleeping.  MEDICATIONS:  See your medication summary on the "After Visit Summary" that nursing will review with you.  You may have some home medications which will be placed on hold until you complete the course of blood thinner medication.  It is important for you to complete the blood thinner medication as prescribed.  PRECAUTIONS:  If you experience chest pain or shortness of breath - call 911 immediately for transfer to the hospital emergency department.   If you develop a fever greater that 101 F, purulent drainage from wound, increased redness or drainage from wound, foul odor from the wound/dressing, or calf pain - CONTACT YOUR SURGEON.  FOLLOW-UP APPOINTMENTS:  If you do not already have a post-op appointment, please call the office for an appointment to be seen by your surgeon.  Guidelines for how soon to be seen are listed in your "After Visit Summary", but are typically between 1-4 weeks after surgery.  OTHER INSTRUCTIONS:   Knee Replacement:  Do not place pillow under knee, focus on keeping the knee straight while resting. CPM instructions: 0-90 degrees, 2 hours in the morning, 2 hours in the afternoon, and 2 hours in the evening. Place foam block, curve side up under heel at all times except when in CPM or when walking.  DO NOT modify, tear, cut, or change the foam block in any way.   MAKE SURE YOU:  Understand these instructions.  Get help right away if you are not doing well or get worse.    Thank you for letting us be a part of  your medical care team.  It is a privilege we respect greatly.  We hope these instructions will help you stay on track for a fast and full recovery!   Diagnostic Studies: DG FEMUR MIN 2 VIEWS LEFT  Result Date: 04/30/2022 CLINICAL DATA:  [id number] Surgery, elective [phone]. Hardware removal and replacement. EXAM: LEFT FEMUR 2 VIEWS COMPARISON:  03/17/2022 FINDINGS: Interval removal of the previously seen plate and screw fixation device in the mid to distal left femur. Changes of left knee replacement. No hardware or bony complicating feature. IMPRESSION: Changes of left knee replacement without visible complicating feature. Electronically Signed   By: Rolm Baptise M.D.   On: 04/30/2022 20:29    Disposition: Discharge disposition: 01-Home or Self Care       Discharge Instructions     Call MD / Call 911   Complete by: As directed    If you experience chest pain or shortness of breath, CALL 911 and be transported to the hospital emergency room.  If you develope a fever above 101 F, pus (white drainage) or increased drainage or redness at the wound, or calf pain, call your surgeon's office.   Change dressing   Complete by: As directed    Do not remove your dressing.   Constipation Prevention   Complete by: As directed    Drink plenty of fluids.  Prune juice may be helpful.  You may use a stool softener, such as Colace (over the counter) 100 mg twice a day.  Use MiraLax (over the counter) for constipation as needed.   Diet - low sodium heart healthy   Complete by: As directed    Discharge instructions   Complete by: As directed    Elevate toes above nose. Use cryotherapy as needed for pain and swelling.   Do not put a pillow under the knee. Place it under the heel.   Complete by: As directed    Driving restrictions   Complete by: As directed    No driving for 6 weeks   Increase activity slowly as tolerated   Complete by: As directed    Lifting restrictions   Complete by: As directed     No lifting for 6 weeks   Post-operative opioid taper instructions:   Complete by: As directed    POST-OPERATIVE OPIOID TAPER INSTRUCTIONS: It is important to wean off of your opioid medication as soon as possible. If you do not need pain medication after your surgery it is ok to stop day one. Opioids include: Codeine, Hydrocodone(Norco, Vicodin), Oxycodone(Percocet, oxycontin) and hydromorphone amongst others.  Long term and even short term use of opiods can cause: Increased pain response Dependence Constipation Depression Respiratory depression And more.  Withdrawal symptoms can include Flu like symptoms Nausea, vomiting And more Techniques to manage these symptoms Hydrate well Eat regular healthy meals Stay active Use relaxation techniques(deep breathing, meditating, yoga) Do Not substitute Alcohol to help with tapering If you have been on opioids for less than two weeks and do not have pain than it is ok to stop all together.  Plan to wean off of opioids This plan should start within one week post op of your joint replacement. Maintain the same interval or time between taking each dose and first decrease the dose.  Cut the total daily intake of opioids by one tablet each day Next start to increase the time between doses. The last dose that should be eliminated is the evening dose.      TED hose   Complete by: As directed    Use stockings (TED hose) for 2 weeks on both leg(s).  You may remove them at night for sleeping.   Weight bearing as tolerated   Complete by: As directed         Follow-up Information     Drue Novel, PA. Schedule an appointment as soon as possible for a visit on 05/11/2022.   Specialty: Orthopedic Surgery Why: For removal of negative pressure dressing and wound re-check. Contact information: [address] [place] [address] [id number]                  Signed: Charlott Rakes 05/05/2022, 8:29 PM

## 2022-05-05 NOTE — Progress Notes (Signed)
Physical Therapy Treatment Patient Details Name: Tammy [name] MRN: [id number] DOB: [birth year] Today's Date: 05/05/2022   History of Present Illness 67 y.o. 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 She is 11 months s/p left distal femur ORIF fracture 04/10/21 with Dr. Marcelino Scot. She has been unable to weightbear in the left leg. 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 s/p removal of implant and replacement of L distal femur 04/30/22. PMH: bipolar, CVA.    PT Comments    Pt is progressing well with mobility, she ambulated 100' with RW, no loss of balance. She is ready to DC home from a PT standpoint.    Recommendations for follow up therapy are one component of a multi-disciplinary discharge planning process, led by the attending physician.  Recommendations may be updated based on patient status, additional functional criteria and insurance authorization.  Follow Up Recommendations       Assistance Recommended at Discharge Intermittent Supervision/Assistance  Patient can return home with the following A little help with bathing/dressing/bathroom;Assist for transportation;Help with stairs or ramp for entrance;Assistance with cooking/housework   Equipment Recommendations  None recommended by PT    Recommendations for Other Services       Precautions / Restrictions Precautions Precautions: Fall Precaution Comments: hemovac, wound vac, pt able to perform SLR Restrictions Weight Bearing Restrictions: No LLE Weight Bearing: Weight bearing as tolerated     Mobility  Bed Mobility Overal bed mobility: Modified Independent       Supine to sit: Modified independent (Device/Increase time), HOB elevated     General bed mobility  comments: up in recliner    Transfers Overall transfer level: Modified independent Equipment used: Rolling walker (2 wheels) Transfers: Sit to/from Stand Sit to Stand: Modified independent (Device/Increase time)           General transfer comment: VCs hand placement    Ambulation/Gait Ambulation/Gait assistance: Modified independent (Device/Increase time) Gait Distance (Feet): 100 Feet Assistive device: Rolling walker (2 wheels) Gait Pattern/deviations: Step-to pattern, Decreased stance time - left, Antalgic Gait velocity: decr     General Gait Details: no buckling of LLE without KI, no loss of balance   Stairs             Wheelchair Mobility    Modified Rankin (Stroke Patients Only)       Balance Overall balance assessment: Modified Independent                                          Cognition Arousal/Alertness: Awake/alert Behavior During Therapy: WFL for tasks assessed/performed Overall Cognitive Status: Within Functional Limits for tasks assessed                                          Exercises Total Joint Exercises Knee Flexion: AROM, Left, 10 reps, Seated Goniometric ROM: ~5-60* AAROM L knee General Exercises - Lower Extremity Ankle Circles/Pumps: AROM, Both, 10 reps, Supine Quad Sets: AROM, Both, 5 reps, Supine Short Arc Quad: AROM, Left, 10 reps, Supine Long Arc Quad: AROM, Left, 5 reps, Seated  Hip ABduction/ADduction: AAROM, Left, 5 reps, Supine Straight Leg Raises: AROM, Left, 10 reps, Supine    General Comments        Pertinent Vitals/Pain Pain Assessment Pain Score: 3  Pain Location: L knee Pain Descriptors / Indicators: Sore Pain Intervention(s): Limited activity within patient's tolerance, Monitored during session    Home Living                          Prior Function            PT Goals (current goals can now be found in the care plan section) Acute Rehab PT  Goals Patient Stated Goal: go to [place], get married without using a walker PT Goal Formulation: With patient Time For Goal Achievement: 05/08/22 Potential to Achieve Goals: Good Progress towards PT goals: Progressing toward goals    Frequency    Min 5X/week      PT Plan Current plan remains appropriate    Co-evaluation              AM-PAC PT "6 Clicks" Mobility   Outcome Measure  Help needed turning from your back to your side while in a flat bed without using bedrails?: None Help needed moving from lying on your back to sitting on the side of a flat bed without using bedrails?: A Little Help needed moving to and from a bed to a chair (including a wheelchair)?: None Help needed standing up from a chair using your arms (e.g., wheelchair or bedside chair)?: None Help needed to walk in hospital room?: None Help needed climbing 3-5 steps with a railing? : A Little 6 Click Score: 22    End of Session Equipment Utilized During Treatment: Gait belt Activity Tolerance: Patient tolerated treatment well Patient left: in chair;with call bell/phone within reach Nurse Communication: Mobility status PT Visit Diagnosis: Difficulty in walking, not elsewhere classified (R26.2) Pain - Right/Left: Left Pain - part of body: Knee     Time: [id number] PT Time Calculation (min) (ACUTE ONLY): 16 min  Charges:  $Gait Training: 8-22 mins                     Blondell Reveal Kistler PT 05/05/2022  Acute Rehabilitation Services  Office [phone]

## 2022-05-06 ENCOUNTER — Encounter (HOSPITAL_COMMUNITY): Payer: Self-pay | Admitting: Orthopedic Surgery

## 2022-05-13 ENCOUNTER — Ambulatory Visit: Payer: Medicare Other | Admitting: Physical Therapy

## 2022-05-15 ENCOUNTER — Ambulatory Visit: Payer: Medicare Other | Attending: Student

## 2022-05-15 ENCOUNTER — Other Ambulatory Visit: Payer: Self-pay

## 2022-05-15 ENCOUNTER — Encounter: Payer: Medicare Other | Admitting: Physical Therapy

## 2022-05-15 DIAGNOSIS — G8929 Other chronic pain: Secondary | ICD-10-CM | POA: Diagnosis present

## 2022-05-15 DIAGNOSIS — M25562 Pain in left knee: Secondary | ICD-10-CM | POA: Diagnosis present

## 2022-05-15 DIAGNOSIS — M6281 Muscle weakness (generalized): Secondary | ICD-10-CM | POA: Insufficient documentation

## 2022-05-15 DIAGNOSIS — R262 Difficulty in walking, not elsewhere classified: Secondary | ICD-10-CM | POA: Diagnosis present

## 2022-05-15 NOTE — Therapy (Signed)
OUTPATIENT PHYSICAL THERAPY LOWER EXTREMITY EVALUATION   Patient Name: Tammy [name] MRN: [id number] DOB:05/06/1955, 67 y.o., female Today's Date: 05/15/2022  END OF SESSION:  PT End of Session - 05/15/22 1042     Visit Number 1    Number of Visits 17    Date for PT Re-Evaluation 06/12/22    Authorization Type Medicare - FOTO    PT Start Time 1042    PT Stop Time 1130    PT Time Calculation (min) 48 min    Activity Tolerance Patient limited by pain    Behavior During Therapy [facility] for tasks assessed/performed             Past Medical History:  Diagnosis Date   Anemia    hx of   Anxiety    on meds   Arthritis    back/bilateral ankles   Bipolar disorder    Breast cancer 2022   LEFT breast-radiation   Depression    on meds   Family history of breast cancer 07/10/2020   Family history of colon cancer 07/10/2020   Family history of ovarian cancer 07/10/2020   GERD (gastroesophageal reflux disease)    on meds   Heart murmur    dx by PCP   HTN (hypertension)    on meds   Hx of radiation therapy    Liver cirrhosis    from hep c   Neuromuscular disorder    Osteopenia    Stroke 09/2012   slurred speech and LEFT sided weakness/notes 10/04/2012   Past Surgical History:  Procedure Laterality Date   ANKLE FRACTURE SURGERY Left 2018   BREAST LUMPECTOMY WITH RADIOACTIVE SEED AND SENTINEL LYMPH NODE BIOPSY Left 07/26/2020   Procedure: LEFT BREAST LUMPECTOMY WITH RADIOACTIVE SEED AND SENTINEL LYMPH NODE BIOPSY;  Surgeon: Abigail Miyamoto, MD;  Location: [facility];  Service: General;  Laterality: Left;   CESAREAN SECTION  1986   COLONOSCOPY     DIAGNOSTIC LAPAROSCOPY  1985   HARDWARE REMOVAL Left 04/30/2022   Procedure: HARDWARE REMOVAL LEFT FEMUR;  Surgeon: Samson Frederic, MD;  Location: [facility];  Service: Orthopedics;  Laterality: Left;  150   ORIF FEMUR FRACTURE Left 04/10/2021   Procedure: OPEN REDUCTION INTERNAL FIXATION (ORIF) DISTAL FEMUR  FRACTURE;  Surgeon: Myrene Galas, MD;  Location: [facility];  Service: Orthopedics;  Laterality: Left;   PARTIAL KNEE ARTHROPLASTY Left 04/30/2022   Procedure: DISTAL FEMUR REPLACEMENT;  Surgeon: Samson Frederic, MD;  Location: [facility];  Service: Orthopedics;  Laterality: Left;  150   Patient Active Problem List   Diagnosis Date Noted   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, left, with nonunion, subsequent encounter 04/30/2022   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 04/30/2022   Hypokalemia 04/16/2021   Hip fracture requiring operative repair 04/16/2021   Vitamin D deficiency 04/12/2021   Hyponatremia 04/11/2021   Acute postoperative anemia due to expected blood loss 04/11/2021   Class 1 obesity 04/11/2021   Left comminuted and displaced fracture of the distal femur metaphysis 04/08/2021   Anemia 04/08/2021   History of CVA (cerebrovascular accident) 04/08/2021   Genetic testing 07/17/2020   Family history of breast cancer 07/10/2020   Family history of ovarian cancer 07/10/2020   Family history of colon cancer 07/10/2020   Malignant neoplasm of upper-outer quadrant of left breast in female, estrogen receptor positive 07/09/2020   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   Chronic pain syndrome 10/06/2012   Nicotine  dependence 10/06/2012   Anxiety state, unspecified 10/05/2012   Bipolar disorder, unspecified (HCC) 10/05/2012   Slurred speech 10/04/2012   Left-sided weakness 10/04/2012   Hyperkalemia 10/04/2012   Acute CVA (cerebrovascular accident) 10/04/2012   HTN (hypertension)     PCP: Quitman LivingsHassan, Sami, MD   REFERRING PROVIDER: Clois DupesHill, Avery S, PA-C   REFERRING DIAG: Closed fracture of distal end of left femur, unspecified fracture morphology, initial encounter [S72.402A]   THERAPY DIAG:  Chronic pain of left knee  Difficulty in walking, not elsewhere classified  Muscle weakness (generalized)  Rationale  for Evaluation and Treatment: Rehabilitation  ONSET DATE: 04/2122  SUBJECTIVE:   SUBJECTIVE STATEMENT: Patient states that she underwent a [partial] knee replacement on 04/30/22 following hardware removal from previous ORIF for distal femur fracture.    She states that she has not able to drive, walk as needed, since ORIF procedure last year.   PERTINENT HISTORY: HARDWARE REMOVAL LEFT FEMUR DISTAL FEMUR REPLACEMENT on 04/30/2022 d/t 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, left, with nonunion, subsequent encounter   pmhx includes ORIF following distal femur fracture, hardware removal and partial knee arthroplasty, CVA, Bipolar Disorder, HTN   PAIN:  Are you having pain? Yes: NPRS scale: 8/10 Pain location: see below  Pain description: Burning (in the front), Aching (in the back), Throbbing (in the calf) Aggravating factors: constant Relieving factors: Sitting while its hanging  PRECAUTIONS: None reported  WEIGHT BEARING RESTRICTIONS:  WBAT  FALLS:  Has patient fallen in last 6 months? No  LIVING ENVIRONMENT: Lives with: lives with their partner Lives in: Mobile home Stairs: Yes: External: 5 steps; bilateral but cannot reach both Has following equipment at home: Walker - 2 wheeled  OCCUPATION: Retired/Disability  PLOF: Independent prior to initial injury  PATIENT GOALS: "I want to be able to move my leg better and get the motion back in my knee. I want to be able to travel and get married without having to use a walker."   NEXT MD VISIT: 05/18/2022  OBJECTIVE:   DIAGNOSTIC FINDINGS:  XRAY Left Femur 04/30/22, post op imaging   FINDINGS: Interval removal of the previously seen plate and screw fixation device in the mid to distal left femur. Changes of left knee replacement. No hardware or bony complicating feature.   IMPRESSION: Changes of left knee replacement without visible complicating feature.  PATIENT SURVEYS:  FOTO 41 current, 60  predicted  COGNITION: Overall cognitive status: Within functional limits for tasks assessed     SENSATION: WFL  EDEMA:  Circumferential: L knee 16", R knee 13.5"   PALPATION: Patient has tenderness to palpation along medial L knee and distal portion of lateral tibial femoral joint. She is reporting some postop numbness along proximal portion of lateral tibial femoral joint.   LOWER EXTREMITY ROM:  Active ROM Right eval Left eval  Hip flexion    Hip extension    Hip abduction    Hip adduction    Hip internal rotation    Hip external rotation    Knee flexion 70 WFL  Knee extension -12 WFL  Ankle dorsiflexion    Ankle plantarflexion    Ankle inversion    Ankle eversion     (Blank rows = not tested)  LOWER EXTREMITY MMT:  MMT Right eval Left eval  Hip flexion    Hip extension    Hip abduction    Hip adduction    Hip internal rotation    Hip external rotation    Knee flexion  Knee extension    Ankle dorsiflexion    Ankle plantarflexion    Ankle inversion    Ankle eversion     (Blank rows = not tested)   FUNCTIONAL TESTS:  5 times sit to stand: not test at time of evaluation, to be assessed at later time  GAIT: Assistive device utilized: Walker - 2 wheeled Level of assistance: Complete Independence Comments: Patient states that she is able to use SPC at home without LOB.    [facility] Adult PT Treatment:                                                DATE: 05/15/2022  Therapeutic Exercise: Supine Quad Sets with towel roll, 3 sec hold x 10, with clinician overpressure with 5 repetitions   Supine Heel Slides with clinician overpressure, 3 sec hold, 2 x 4   Therapeutic Activity: Patient education regarding prognosis, goals of PT, prevention of contracture and blood clots. Discussed importance of adherence to HEP provided handout for home instruction.  Discussed indications/contraindications for cold vs. Hot pack usage, per post-op status    PATIENT  EDUCATION:  Education details: HEP provided, discussed prognosis and POC.  Person educated: Patient Education method: Explanation, Demonstration, and Handouts Education comprehension: verbalized understanding and returned demonstration  HOME EXERCISE PROGRAM: Access Code: [email or web address] URL: [email or web address] Date: 05/15/2022 Prepared by: Mauri Reading  Exercises - Supine Ankle Pumps  - 1 x daily - 7 x weekly - 2-3 sets - 10 reps - Supine Quad Set  - 1 x daily - 7 x weekly - 2-3 sets - 10 reps - Supine Heel Slide with Strap  - 1 x daily - 7 x weekly - 2-3 sets - 10 reps - Long Sitting 4 Way Patellar Glide  - 1 x daily - 7 x weekly - 3 sets - 10 reps - Supine Short Arc Quad  - 1 x daily - 7 x weekly - 3 sets - 10 reps - 3 sec hold - Supine Straight Leg Raises  - 1 x daily - 7 x weekly - 2-3 sets - 10 reps - Supine Hip Abduction  - 1 x daily - 7 x weekly - 2-3 sets - 10 reps  ASSESSMENT:  CLINICAL IMPRESSION: Patient is a 67 y.o. female who was seen today for physical therapy evaluation and treatment for L knee pain and mobility deficits following L partial knee arthroplasty on 04/30/22. She is demonstrating decreased L knee AROM in flexion and extension, limited L knee MMT due to current ROM deficits, altered gait mechanics, and diminished independence with normal daily activities. She will benefit from skilled PT intervention to address current impairment and functional limitations.   OBJECTIVE IMPAIRMENTS: Abnormal gait, decreased activity tolerance, decreased balance, decreased endurance, decreased mobility, difficulty walking, decreased ROM, decreased strength, and pain.   ACTIVITY LIMITATIONS: squatting, sleeping, stairs, toileting, and locomotion level  PARTICIPATION LIMITATIONS: cleaning, driving, shopping, and community activity  PERSONAL FACTORS: Past/current experiences, Time since onset of injury/illness/exacerbation, and 1-2 comorbidities: CVA, Bipolar Disorder,  HTN  are also affecting patient's functional outcome.   REHAB POTENTIAL: Fair hx of persistent extension lag following previous L knee ORIF  CLINICAL DECISION MAKING: Evolving/moderate complexity  EVALUATION COMPLEXITY: Moderate   GOALS: Goals reviewed with patient? Yes  SHORT TERM GOALS: Target date: 06/12/2022   Patient will be independent with initial HEP,  including exercises provided by surgeon.  Baseline: provided and reviewed at eval Goal status: INITIAL  2.  Patient will demonstrate full range active knee extension. Baseline: lacking 12 degrees of extension at time of evaluation.  Goal status: INITIAL  3.  Patient will demonstrate 90 degrees of knee flexion AROM. Baseline: 70 degrees of active knee flexion  Goal status: INITIAL   LONG TERM GOALS: Target date: 07/10/2022   Patient will demonstrate ability to ambulate at least 200 ft safely with LRAD.  Baseline: able to ambulate safely with 2 wheeled walker.  Goal status: INITIAL  2.  Patient will demonstrate ability to ascend/descend stairs at least 10 times with use of railing, and <3/10 pain.  Baseline:  Goal status: INITIAL  3.  Patient will demonstrate improved functional ability by score 60 or greater on FOTO.  Baseline: 41 at eval Goal status: INITIAL     PLAN:  PT FREQUENCY: 2x/week  PT DURATION: 8 weeks  PLANNED INTERVENTIONS: Therapeutic exercises, Therapeutic activity, Neuromuscular re-education, Balance training, Gait training, Patient/Family education, Self Care, Joint mobilization, Stair training, Electrical stimulation, Cryotherapy, Moist heat, Manual lymph drainage, scar mobilization, Taping, Vasopneumatic device, Manual therapy, and Re-evaluation  PLAN FOR NEXT SESSION: Review and update HEP as needed. Continue with PROM/AAROM activities with progression to AROM per post-op status.    Mauri Reading, PT 05/15/2022, 2:59 PM

## 2022-05-18 ENCOUNTER — Encounter: Payer: Medicare Other | Admitting: Physical Therapy

## 2022-05-20 ENCOUNTER — Encounter: Payer: Medicare Other | Admitting: Physical Therapy

## 2022-05-25 ENCOUNTER — Ambulatory Visit: Payer: Medicare Other | Admitting: Physical Therapy

## 2022-05-27 ENCOUNTER — Ambulatory Visit: Payer: Medicare Other | Admitting: Physical Therapy

## 2022-05-28 ENCOUNTER — Ambulatory Visit: Payer: Medicare Other | Admitting: Physical Therapy

## 2022-06-01 ENCOUNTER — Ambulatory Visit: Payer: Medicare Other | Admitting: Physical Therapy

## 2022-06-02 NOTE — Assessment & Plan Note (Deleted)
-  Stage IA, pT1bN0, invasive ductal carcinoma, grade 1, ER and PR positive, HER2 negative -Diagnosed in May 2022 -Status post left lumpectomy and sentinel lymph node biopsy, Oncotype recurrence score 16. -Genetic testing was negative -She received adjuvant radiation -She is currently on anastrozole, started in September 2022 -Patient is clinically doing well, tolerating anastrozole well, exam was unremarkable, no clinical concern for recurrence. -Continue breast cancer surveillance

## 2022-06-03 ENCOUNTER — Ambulatory Visit: Payer: Medicare Other | Admitting: Physical Therapy

## 2022-06-03 ENCOUNTER — Inpatient Hospital Stay: Payer: Medicare Other | Attending: Hematology | Admitting: Hematology

## 2022-06-03 ENCOUNTER — Other Ambulatory Visit: Payer: Self-pay

## 2022-06-03 ENCOUNTER — Encounter (HOSPITAL_COMMUNITY): Payer: Self-pay | Admitting: Orthopedic Surgery

## 2022-06-03 DIAGNOSIS — Z170 Estrogen receptor positive status [ER+]: Secondary | ICD-10-CM

## 2022-06-03 NOTE — Progress Notes (Signed)
For Short Stay: COVID SWAB appointment date:  Bowel Prep reminder:   For Anesthesia: PCP - Dr. Quitman Livings Cardiologist - N/A  Chest x-ray -  EKG - 03/17/22 Stress Test -  ECHO -  Cardiac Cath -  Pacemaker/ICD device last checked: Pacemaker orders received: Device Rep notified:  Spinal Cord Stimulator: N/A  Sleep Study - N/A CPAP -   Fasting Blood Sugar - N/A Checks Blood Sugar _____ times a day Date and result of last Hgb A1c-  Last dose of GLP1 agonist-  GLP1 instructions:   Last dose of SGLT-2 inhibitors- N/A SGLT-2 instructions:   Blood Thinner Instructions: Aspirin Instructions: No instructions Last Dose:  Activity level: Can go up a flight of stairs and activities of daily living without stopping and without chest pain and/or shortness of breath   Able to exercise without chest pain and/or shortness of breath  Anesthesia review: Hx: HTN,Stroke,heart murmur.  Patient denies shortness of breath, fever, cough and chest pain at PAT appointment   Patient verbalized understanding of instructions that were given to them at the PAT appointment. Patient was also instructed that they will need to review over the PAT instructions again at home before surgery.

## 2022-06-03 NOTE — Progress Notes (Signed)
Pt. Needs orders for surgery tomorrow.

## 2022-06-04 ENCOUNTER — Ambulatory Visit (HOSPITAL_COMMUNITY): Payer: Medicare Other | Admitting: Certified Registered Nurse Anesthetist

## 2022-06-04 ENCOUNTER — Other Ambulatory Visit: Payer: Self-pay

## 2022-06-04 ENCOUNTER — Inpatient Hospital Stay (HOSPITAL_COMMUNITY)
Admission: AD | Admit: 2022-06-04 | Discharge: 2022-06-18 | DRG: 857 | Disposition: A | Discharge status: Home Health | Payer: Medicare Other | Attending: Orthopedic Surgery | Admitting: Orthopedic Surgery

## 2022-06-04 ENCOUNTER — Encounter (HOSPITAL_COMMUNITY)
Admission: AD | Disposition: A | Discharge status: Home Health | Payer: Self-pay | Source: Home / Self Care | Attending: Orthopedic Surgery

## 2022-06-04 ENCOUNTER — Encounter (HOSPITAL_COMMUNITY): Payer: Self-pay | Admitting: Orthopedic Surgery

## 2022-06-04 DIAGNOSIS — E876 Hypokalemia: Secondary | ICD-10-CM | POA: Diagnosis present

## 2022-06-04 DIAGNOSIS — Z79899 Other long term (current) drug therapy: Secondary | ICD-10-CM | POA: Diagnosis not present

## 2022-06-04 DIAGNOSIS — M19072 Primary osteoarthritis, left ankle and foot: Secondary | ICD-10-CM | POA: Diagnosis present

## 2022-06-04 DIAGNOSIS — F319 Bipolar disorder, unspecified: Secondary | ICD-10-CM | POA: Diagnosis present

## 2022-06-04 DIAGNOSIS — D62 Acute posthemorrhagic anemia: Secondary | ICD-10-CM | POA: Diagnosis not present

## 2022-06-04 DIAGNOSIS — K219 Gastro-esophageal reflux disease without esophagitis: Secondary | ICD-10-CM | POA: Diagnosis present

## 2022-06-04 DIAGNOSIS — F418 Other specified anxiety disorders: Secondary | ICD-10-CM | POA: Diagnosis not present

## 2022-06-04 DIAGNOSIS — K7460 Unspecified cirrhosis of liver: Secondary | ICD-10-CM | POA: Diagnosis present

## 2022-06-04 DIAGNOSIS — Z800 Family history of malignant neoplasm of digestive organs: Secondary | ICD-10-CM

## 2022-06-04 DIAGNOSIS — Z170 Estrogen receptor positive status [ER+]: Secondary | ICD-10-CM

## 2022-06-04 DIAGNOSIS — Z8673 Personal history of transient ischemic attack (TIA), and cerebral infarction without residual deficits: Secondary | ICD-10-CM

## 2022-06-04 DIAGNOSIS — T8131XA Disruption of external operation (surgical) wound, not elsewhere classified, initial encounter: Secondary | ICD-10-CM | POA: Diagnosis present

## 2022-06-04 DIAGNOSIS — B952 Enterococcus as the cause of diseases classified elsewhere: Secondary | ICD-10-CM | POA: Diagnosis present

## 2022-06-04 DIAGNOSIS — F32A Depression, unspecified: Secondary | ICD-10-CM | POA: Diagnosis present

## 2022-06-04 DIAGNOSIS — Z923 Personal history of irradiation: Secondary | ICD-10-CM | POA: Diagnosis not present

## 2022-06-04 DIAGNOSIS — I69354 Hemiplegia and hemiparesis following cerebral infarction affecting left non-dominant side: Secondary | ICD-10-CM | POA: Diagnosis not present

## 2022-06-04 DIAGNOSIS — Z8041 Family history of malignant neoplasm of ovary: Secondary | ICD-10-CM

## 2022-06-04 DIAGNOSIS — C50412 Malignant neoplasm of upper-outer quadrant of left female breast: Secondary | ICD-10-CM

## 2022-06-04 DIAGNOSIS — T847XXA Infection and inflammatory reaction due to other internal orthopedic prosthetic devices, implants and grafts, initial encounter: Secondary | ICD-10-CM | POA: Diagnosis not present

## 2022-06-04 DIAGNOSIS — Z888 Allergy status to other drugs, medicaments and biological substances status: Secondary | ICD-10-CM

## 2022-06-04 DIAGNOSIS — T847XXD Infection and inflammatory reaction due to other internal orthopedic prosthetic devices, implants and grafts, subsequent encounter: Secondary | ICD-10-CM | POA: Diagnosis not present

## 2022-06-04 DIAGNOSIS — I10 Essential (primary) hypertension: Secondary | ICD-10-CM | POA: Diagnosis present

## 2022-06-04 DIAGNOSIS — I639 Cerebral infarction, unspecified: Secondary | ICD-10-CM | POA: Diagnosis not present

## 2022-06-04 DIAGNOSIS — I96 Gangrene, not elsewhere classified: Secondary | ICD-10-CM | POA: Diagnosis present

## 2022-06-04 DIAGNOSIS — Z79811 Long term (current) use of aromatase inhibitors: Secondary | ICD-10-CM

## 2022-06-04 DIAGNOSIS — Z833 Family history of diabetes mellitus: Secondary | ICD-10-CM | POA: Diagnosis not present

## 2022-06-04 DIAGNOSIS — Z87891 Personal history of nicotine dependence: Secondary | ICD-10-CM

## 2022-06-04 DIAGNOSIS — F17211 Nicotine dependence, cigarettes, in remission: Secondary | ICD-10-CM | POA: Diagnosis not present

## 2022-06-04 DIAGNOSIS — T8141XA Infection following a procedure, superficial incisional surgical site, initial encounter: Secondary | ICD-10-CM | POA: Diagnosis present

## 2022-06-04 DIAGNOSIS — B182 Chronic viral hepatitis C: Secondary | ICD-10-CM | POA: Diagnosis present

## 2022-06-04 DIAGNOSIS — D638 Anemia in other chronic diseases classified elsewhere: Secondary | ICD-10-CM | POA: Diagnosis present

## 2022-06-04 DIAGNOSIS — M8580 Other specified disorders of bone density and structure, unspecified site: Secondary | ICD-10-CM | POA: Diagnosis present

## 2022-06-04 DIAGNOSIS — M479 Spondylosis, unspecified: Secondary | ICD-10-CM | POA: Diagnosis present

## 2022-06-04 DIAGNOSIS — Z791 Long term (current) use of non-steroidal anti-inflammatories (NSAID): Secondary | ICD-10-CM

## 2022-06-04 DIAGNOSIS — M19071 Primary osteoarthritis, right ankle and foot: Secondary | ICD-10-CM | POA: Diagnosis present

## 2022-06-04 DIAGNOSIS — Z853 Personal history of malignant neoplasm of breast: Secondary | ICD-10-CM | POA: Diagnosis not present

## 2022-06-04 DIAGNOSIS — L02416 Cutaneous abscess of left lower limb: Secondary | ICD-10-CM | POA: Diagnosis present

## 2022-06-04 DIAGNOSIS — T8459XA Infection and inflammatory reaction due to other internal joint prosthesis, initial encounter: Secondary | ICD-10-CM | POA: Diagnosis not present

## 2022-06-04 DIAGNOSIS — Z96659 Presence of unspecified artificial knee joint: Secondary | ICD-10-CM | POA: Diagnosis not present

## 2022-06-04 DIAGNOSIS — Z89612 Acquired absence of left leg above knee: Secondary | ICD-10-CM | POA: Diagnosis not present

## 2022-06-04 DIAGNOSIS — S81002A Unspecified open wound, left knee, initial encounter: Secondary | ICD-10-CM | POA: Diagnosis not present

## 2022-06-04 DIAGNOSIS — M869 Osteomyelitis, unspecified: Secondary | ICD-10-CM | POA: Diagnosis not present

## 2022-06-04 DIAGNOSIS — Z803 Family history of malignant neoplasm of breast: Secondary | ICD-10-CM | POA: Diagnosis not present

## 2022-06-04 DIAGNOSIS — T8454XA Infection and inflammatory reaction due to internal left knee prosthesis, initial encounter: Secondary | ICD-10-CM

## 2022-06-04 DIAGNOSIS — F3170 Bipolar disorder, currently in remission, most recent episode unspecified: Secondary | ICD-10-CM | POA: Diagnosis not present

## 2022-06-04 DIAGNOSIS — F419 Anxiety disorder, unspecified: Secondary | ICD-10-CM | POA: Diagnosis present

## 2022-06-04 DIAGNOSIS — B9689 Other specified bacterial agents as the cause of diseases classified elsewhere: Secondary | ICD-10-CM | POA: Diagnosis not present

## 2022-06-04 DIAGNOSIS — M8618 Other acute osteomyelitis, other site: Secondary | ICD-10-CM | POA: Diagnosis not present

## 2022-06-04 DIAGNOSIS — T8189XA Other complications of procedures, not elsewhere classified, initial encounter: Secondary | ICD-10-CM | POA: Diagnosis not present

## 2022-06-04 HISTORY — PX: INCISION AND DRAINAGE OF WOUND: SHX1803

## 2022-06-04 HISTORY — DX: Unspecified viral hepatitis C without hepatic coma: B19.20

## 2022-06-04 LAB — POCT I-STAT, CHEM 8
BUN: 9 mg/dL (ref 8–23)
Calcium, Ion: 1.15 mmol/L (ref 1.15–1.40)
Chloride: 104 mmol/L (ref 98–111)
Creatinine, Ser: 0.9 mg/dL (ref 0.44–1.00)
Glucose, Bld: 97 mg/dL (ref 70–99)
HCT: 30 % — ABNORMAL LOW (ref 36.0–46.0)
Hemoglobin: 10.2 g/dL — ABNORMAL LOW (ref 12.0–15.0)
Potassium: 3.3 mmol/L — ABNORMAL LOW (ref 3.5–5.1)
Sodium: 142 mmol/L (ref 135–145)
TCO2: 27 mmol/L (ref 22–32)

## 2022-06-04 LAB — TYPE AND SCREEN: Antibody Screen: NEGATIVE

## 2022-06-04 LAB — AEROBIC/ANAEROBIC CULTURE W GRAM STAIN (SURGICAL/DEEP WOUND)

## 2022-06-04 SURGERY — IRRIGATION AND DEBRIDEMENT WOUND
Anesthesia: General | Laterality: Left

## 2022-06-04 MED ORDER — PROPOFOL 10 MG/ML IV BOLUS
INTRAVENOUS | Status: AC
  Filled 2022-06-04: qty 20

## 2022-06-04 MED ORDER — CALCIUM CARBONATE 1250 (500 CA) MG PO TABS
1.0000 | ORAL_TABLET | Freq: Every day | ORAL | Status: DC
  Administered 2022-06-05 – 2022-06-18 (×13): 1250 mg via ORAL
  Filled 2022-06-04 (×14): qty 1

## 2022-06-04 MED ORDER — TRANEXAMIC ACID-NACL 1000-0.7 MG/100ML-% IV SOLN
1000.0000 mg | INTRAVENOUS | Status: AC
  Administered 2022-06-04: 1000 mg via INTRAVENOUS
  Filled 2022-06-04: qty 100

## 2022-06-04 MED ORDER — POTASSIUM CHLORIDE CRYS ER 10 MEQ PO TBCR
10.0000 meq | EXTENDED_RELEASE_TABLET | Freq: Every day | ORAL | Status: DC
  Administered 2022-06-04 – 2022-06-18 (×16): 10 meq via ORAL
  Filled 2022-06-04 (×17): qty 1

## 2022-06-04 MED ORDER — CELECOXIB 200 MG PO CAPS
200.0000 mg | ORAL_CAPSULE | Freq: Two times a day (BID) | ORAL | Status: DC
  Administered 2022-06-04 – 2022-06-18 (×28): 200 mg via ORAL
  Filled 2022-06-04 (×28): qty 1

## 2022-06-04 MED ORDER — CEFAZOLIN SODIUM-DEXTROSE 2-4 GM/100ML-% IV SOLN: 2.0000 g | INTRAVENOUS | Status: DC

## 2022-06-04 MED ORDER — ACETAMINOPHEN 325 MG PO TABS: 325.0000 mg | ORAL_TABLET | Freq: Four times a day (QID) | ORAL | Status: DC | PRN

## 2022-06-04 MED ORDER — FENTANYL CITRATE (PF) 250 MCG/5ML IJ SOLN
INTRAMUSCULAR | Status: DC | PRN
  Administered 2022-06-04: 25 ug via INTRAVENOUS
  Administered 2022-06-04: 50 ug via INTRAVENOUS
  Administered 2022-06-04: 25 ug via INTRAVENOUS
  Administered 2022-06-04 (×2): 50 ug via INTRAVENOUS

## 2022-06-04 MED ORDER — METHOCARBAMOL 500 MG PO TABS
500.0000 mg | ORAL_TABLET | Freq: Four times a day (QID) | ORAL | Status: DC | PRN
  Administered 2022-06-04 – 2022-06-07 (×4): 500 mg via ORAL
  Filled 2022-06-04 (×4): qty 1

## 2022-06-04 MED ORDER — SODIUM CHLORIDE 0.9 % IR SOLN
Status: DC | PRN
  Administered 2022-06-04: 3000 mL

## 2022-06-04 MED ORDER — ASPIRIN 81 MG PO CHEW
81.0000 mg | CHEWABLE_TABLET | Freq: Two times a day (BID) | ORAL | Status: DC
  Administered 2022-06-04 – 2022-06-05 (×2): 81 mg via ORAL
  Filled 2022-06-04 (×2): qty 1

## 2022-06-04 MED ORDER — ACETAMINOPHEN 500 MG PO TABS: 1000.0000 mg | ORAL_TABLET | Freq: Once | ORAL | Status: DC

## 2022-06-04 MED ORDER — SODIUM CHLORIDE 0.9 % IV SOLN: INTRAVENOUS | Status: DC

## 2022-06-04 MED ORDER — PHENOL 1.4 % MT LIQD: 1.0000 | OROMUCOSAL | Status: DC | PRN

## 2022-06-04 MED ORDER — MIDAZOLAM HCL 2 MG/2ML IJ SOLN
INTRAMUSCULAR | Status: AC
  Filled 2022-06-04: qty 2

## 2022-06-04 MED ORDER — HYDRALAZINE HCL 25 MG PO TABS
25.0000 mg | ORAL_TABLET | Freq: Two times a day (BID) | ORAL | Status: DC
  Administered 2022-06-04 – 2022-06-10 (×12): 25 mg via ORAL
  Filled 2022-06-04 (×13): qty 1

## 2022-06-04 MED ORDER — ALUM & MAG HYDROXIDE-SIMETH 200-200-20 MG/5ML PO SUSP: 30.0000 mL | ORAL | Status: DC | PRN

## 2022-06-04 MED ORDER — SENNA 8.6 MG PO TABS
1.0000 | ORAL_TABLET | Freq: Two times a day (BID) | ORAL | Status: DC
  Administered 2022-06-04 – 2022-06-06 (×5): 8.6 mg via ORAL
  Filled 2022-06-04 (×5): qty 1

## 2022-06-04 MED ORDER — MIDAZOLAM HCL 5 MG/5ML IJ SOLN
INTRAMUSCULAR | Status: DC | PRN
  Administered 2022-06-04: 2 mg via INTRAVENOUS

## 2022-06-04 MED ORDER — HYDROCODONE-ACETAMINOPHEN 7.5-325 MG PO TABS: 1.0000 | ORAL_TABLET | ORAL | Status: DC | PRN

## 2022-06-04 MED ORDER — MORPHINE SULFATE (PF) 2 MG/ML IV SOLN
0.5000 mg | INTRAVENOUS | Status: DC | PRN
  Administered 2022-06-07: 1 mg via INTRAVENOUS
  Filled 2022-06-04: qty 1

## 2022-06-04 MED ORDER — FENTANYL CITRATE (PF) 100 MCG/2ML IJ SOLN
INTRAMUSCULAR | Status: AC
  Filled 2022-06-04: qty 2

## 2022-06-04 MED ORDER — POVIDONE-IODINE 10 % EX SWAB: 2.0000 | Freq: Once | CUTANEOUS | Status: DC

## 2022-06-04 MED ORDER — BISACODYL 10 MG RE SUPP: 10.0000 mg | Freq: Every day | RECTAL | Status: DC | PRN

## 2022-06-04 MED ORDER — DOCUSATE SODIUM 100 MG PO CAPS
100.0000 mg | ORAL_CAPSULE | Freq: Two times a day (BID) | ORAL | Status: DC
  Administered 2022-06-04 – 2022-06-06 (×5): 100 mg via ORAL
  Filled 2022-06-04 (×5): qty 1

## 2022-06-04 MED ORDER — ACETAMINOPHEN 500 MG PO TABS
500.0000 mg | ORAL_TABLET | Freq: Four times a day (QID) | ORAL | Status: AC
  Filled 2022-06-04 (×3): qty 1

## 2022-06-04 MED ORDER — ADULT MULTIVITAMIN W/MINERALS CH
1.0000 | ORAL_TABLET | Freq: Every day | ORAL | Status: DC
  Administered 2022-06-05 – 2022-06-18 (×12): 1 via ORAL
  Filled 2022-06-04 (×12): qty 1

## 2022-06-04 MED ORDER — CEFAZOLIN SODIUM-DEXTROSE 2-4 GM/100ML-% IV SOLN
2.0000 g | INTRAVENOUS | Status: AC
  Administered 2022-06-04: 2 g via INTRAVENOUS
  Filled 2022-06-04: qty 100

## 2022-06-04 MED ORDER — ACETAMINOPHEN 10 MG/ML IV SOLN
INTRAVENOUS | Status: DC | PRN
  Administered 2022-06-04: 1000 mg via INTRAVENOUS
  Administered 2022-06-04: 600 mg via INTRAVENOUS

## 2022-06-04 MED ORDER — OXYCODONE HCL 5 MG PO TABS
5.0000 mg | ORAL_TABLET | Freq: Once | ORAL | Status: AC | PRN
  Administered 2022-06-04: 5 mg via ORAL

## 2022-06-04 MED ORDER — VITAMIN B-12 1000 MCG PO TABS
1000.0000 ug | ORAL_TABLET | Freq: Every day | ORAL | Status: DC
  Administered 2022-06-05 – 2022-06-18 (×12): 1000 ug via ORAL
  Filled 2022-06-04 (×12): qty 1

## 2022-06-04 MED ORDER — ZINC SULFATE 220 (50 ZN) MG PO CAPS
220.0000 mg | ORAL_CAPSULE | Freq: Every day | ORAL | Status: DC
  Administered 2022-06-05 – 2022-06-18 (×12): 220 mg via ORAL
  Filled 2022-06-04 (×12): qty 1

## 2022-06-04 MED ORDER — PHENYLEPHRINE HCL-NACL 20-0.9 MG/250ML-% IV SOLN
INTRAVENOUS | Status: DC | PRN
  Administered 2022-06-04: 50 ug/min via INTRAVENOUS

## 2022-06-04 MED ORDER — HYDROMORPHONE HCL 1 MG/ML IJ SOLN
INTRAMUSCULAR | Status: AC
  Administered 2022-06-04: 0.5 mg via INTRAVENOUS
  Filled 2022-06-04: qty 2

## 2022-06-04 MED ORDER — CEFAZOLIN SODIUM-DEXTROSE 2-4 GM/100ML-% IV SOLN: 2.0000 g | Freq: Four times a day (QID) | INTRAVENOUS | Status: DC

## 2022-06-04 MED ORDER — GABAPENTIN 100 MG PO CAPS
100.0000 mg | ORAL_CAPSULE | Freq: Two times a day (BID) | ORAL | Status: DC
  Administered 2022-06-04 – 2022-06-18 (×28): 100 mg via ORAL
  Filled 2022-06-04 (×28): qty 1

## 2022-06-04 MED ORDER — ONDANSETRON HCL 4 MG/2ML IJ SOLN
INTRAMUSCULAR | Status: DC | PRN
  Administered 2022-06-04: 4 mg via INTRAVENOUS

## 2022-06-04 MED ORDER — PIPERACILLIN-TAZOBACTAM 3.375 G IVPB
3.3750 g | Freq: Three times a day (TID) | INTRAVENOUS | Status: DC
  Administered 2022-06-04 – 2022-06-09 (×15): 3.375 g via INTRAVENOUS
  Filled 2022-06-04 (×16): qty 50

## 2022-06-04 MED ORDER — DEXAMETHASONE SODIUM PHOSPHATE 10 MG/ML IJ SOLN
INTRAMUSCULAR | Status: DC | PRN
  Administered 2022-06-04: 10 mg via INTRAVENOUS

## 2022-06-04 MED ORDER — BUPROPION HCL ER (SR) 150 MG PO TB12
150.0000 mg | ORAL_TABLET | Freq: Two times a day (BID) | ORAL | Status: DC
  Administered 2022-06-04 – 2022-06-18 (×27): 150 mg via ORAL
  Filled 2022-06-04 (×28): qty 1

## 2022-06-04 MED ORDER — SODIUM CHLORIDE 0.9 % IV SOLN: INTRAVENOUS | Status: DC | PRN

## 2022-06-04 MED ORDER — HYDROMORPHONE HCL 1 MG/ML IJ SOLN
0.2500 mg | INTRAMUSCULAR | Status: DC | PRN
  Administered 2022-06-04 (×3): 0.5 mg via INTRAVENOUS

## 2022-06-04 MED ORDER — ONDANSETRON HCL 4 MG PO TABS: 4.0000 mg | ORAL_TABLET | Freq: Four times a day (QID) | ORAL | Status: DC | PRN

## 2022-06-04 MED ORDER — LIP MEDEX EX OINT
TOPICAL_OINTMENT | CUTANEOUS | Status: AC
  Filled 2022-06-04: qty 7

## 2022-06-04 MED ORDER — PROMETHAZINE HCL 25 MG/ML IJ SOLN: 6.2500 mg | INTRAMUSCULAR | Status: DC | PRN

## 2022-06-04 MED ORDER — ANASTROZOLE 1 MG PO TABS
1.0000 mg | ORAL_TABLET | Freq: Every day | ORAL | Status: DC
  Administered 2022-06-04 – 2022-06-18 (×15): 1 mg via ORAL
  Filled 2022-06-04 (×15): qty 1

## 2022-06-04 MED ORDER — ZINC GLUCONATE 50 MG PO TABS: 50.0000 mg | ORAL_TABLET | Freq: Every day | ORAL | Status: DC

## 2022-06-04 MED ORDER — VITAMIN C 500 MG PO TABS
1000.0000 mg | ORAL_TABLET | Freq: Every day | ORAL | Status: DC
  Administered 2022-06-05 – 2022-06-18 (×12): 1000 mg via ORAL
  Filled 2022-06-04 (×12): qty 2

## 2022-06-04 MED ORDER — PANTOPRAZOLE SODIUM 40 MG PO TBEC
40.0000 mg | DELAYED_RELEASE_TABLET | Freq: Every day | ORAL | Status: DC
  Administered 2022-06-05 – 2022-06-18 (×14): 40 mg via ORAL
  Filled 2022-06-04 (×15): qty 1

## 2022-06-04 MED ORDER — LACTATED RINGERS IV SOLN: INTRAVENOUS | Status: DC

## 2022-06-04 MED ORDER — OXYCODONE HCL 5 MG/5ML PO SOLN: 5.0000 mg | Freq: Once | ORAL | Status: AC | PRN

## 2022-06-04 MED ORDER — DIPHENHYDRAMINE HCL 12.5 MG/5ML PO ELIX: 12.5000 mg | ORAL_SOLUTION | ORAL | Status: DC | PRN

## 2022-06-04 MED ORDER — METHOCARBAMOL 1000 MG/10ML IJ SOLN: 500.0000 mg | Freq: Four times a day (QID) | INTRAVENOUS | Status: DC | PRN

## 2022-06-04 MED ORDER — HYDROCODONE-ACETAMINOPHEN 5-325 MG PO TABS
1.0000 | ORAL_TABLET | ORAL | Status: DC | PRN
  Administered 2022-06-04 – 2022-06-06 (×9): 2 via ORAL
  Filled 2022-06-04 (×10): qty 2

## 2022-06-04 MED ORDER — POLYETHYLENE GLYCOL 3350 17 G PO PACK: 17.0000 g | PACK | Freq: Every day | ORAL | Status: DC | PRN

## 2022-06-04 MED ORDER — VANCOMYCIN HCL 1250 MG/250ML IV SOLN
1250.0000 mg | INTRAVENOUS | Status: DC
  Administered 2022-06-05 – 2022-06-09 (×5): 1250 mg via INTRAVENOUS
  Filled 2022-06-04 (×5): qty 250

## 2022-06-04 MED ORDER — METOCLOPRAMIDE HCL 5 MG PO TABS: 5.0000 mg | ORAL_TABLET | Freq: Three times a day (TID) | ORAL | Status: DC | PRN

## 2022-06-04 MED ORDER — CALCIUM 500 MG PO TABS: 500.0000 mg | ORAL_TABLET | Freq: Every day | ORAL | Status: DC

## 2022-06-04 MED ORDER — METOCLOPRAMIDE HCL 5 MG/ML IJ SOLN: 5.0000 mg | Freq: Three times a day (TID) | INTRAMUSCULAR | Status: DC | PRN

## 2022-06-04 MED ORDER — VANCOMYCIN HCL IN DEXTROSE 1-5 GM/200ML-% IV SOLN
1000.0000 mg | INTRAVENOUS | Status: AC
  Administered 2022-06-04: 1000 mg via INTRAVENOUS
  Filled 2022-06-04: qty 200

## 2022-06-04 MED ORDER — PROPOFOL 10 MG/ML IV BOLUS
INTRAVENOUS | Status: DC | PRN
  Administered 2022-06-04: 150 mg via INTRAVENOUS

## 2022-06-04 MED ORDER — LIDOCAINE 2% (20 MG/ML) 5 ML SYRINGE
INTRAMUSCULAR | Status: DC | PRN
  Administered 2022-06-04: 20 mg via INTRAVENOUS

## 2022-06-04 MED ORDER — MIDAZOLAM HCL 2 MG/2ML IJ SOLN: 0.5000 mg | Freq: Once | INTRAMUSCULAR | Status: DC | PRN

## 2022-06-04 MED ORDER — ONDANSETRON HCL 4 MG/2ML IJ SOLN
4.0000 mg | Freq: Four times a day (QID) | INTRAMUSCULAR | Status: DC | PRN
  Administered 2022-06-05: 4 mg via INTRAVENOUS
  Filled 2022-06-04: qty 2

## 2022-06-04 MED ORDER — MENTHOL 3 MG MT LOZG: 1.0000 | LOZENGE | OROMUCOSAL | Status: DC | PRN

## 2022-06-04 MED ORDER — OXYCODONE HCL 5 MG PO TABS
ORAL_TABLET | ORAL | Status: AC
  Filled 2022-06-04: qty 1

## 2022-06-04 MED ORDER — ACETAMINOPHEN 10 MG/ML IV SOLN
INTRAVENOUS | Status: AC
  Filled 2022-06-04: qty 100

## 2022-06-04 SURGICAL SUPPLY — 53 items
ADH SKN CLS APL DERMABOND .7 (GAUZE/BANDAGES/DRESSINGS) ×1
APL PRP STRL LF DISP 70% ISPRP (MISCELLANEOUS) ×1
BAG COUNTER SPONGE SURGICOUNT (BAG) IMPLANT
BAG SPEC THK2 15X12 ZIP CLS (MISCELLANEOUS) ×1
BAG SPNG CNTER NS LX DISP (BAG)
BAG ZIPLOCK 12X15 (MISCELLANEOUS) ×2 IMPLANT
BANDAGE ESMARK 6X9 LF (GAUZE/BANDAGES/DRESSINGS) ×2 IMPLANT
BNDG CMPR 9X6 STRL LF SNTH (GAUZE/BANDAGES/DRESSINGS) ×1
BNDG CMPR MED 10X6 ELC LF (GAUZE/BANDAGES/DRESSINGS) ×1
BNDG ELASTIC 6X10 VLCR STRL LF (GAUZE/BANDAGES/DRESSINGS) IMPLANT
BNDG ESMARK 6X9 LF (GAUZE/BANDAGES/DRESSINGS) ×1
CHLORAPREP W/TINT 26 (MISCELLANEOUS) ×2 IMPLANT
COVER SURGICAL LIGHT HANDLE (MISCELLANEOUS) ×2 IMPLANT
CUFF TOURN SGL QUICK 34 (TOURNIQUET CUFF) ×1
CUFF TRNQT CYL 34X4.125X (TOURNIQUET CUFF) ×2 IMPLANT
DERMABOND ADVANCED .7 DNX12 (GAUZE/BANDAGES/DRESSINGS) ×2 IMPLANT
DRAPE SHEET LG 3/4 BI-LAMINATE (DRAPES) ×6 IMPLANT
DRSG ADAPTIC 3X8 NADH LF (GAUZE/BANDAGES/DRESSINGS) IMPLANT
DRSG AQUACEL AG ADV 3.5X10 (GAUZE/BANDAGES/DRESSINGS) ×2 IMPLANT
DRSG VERSA FOAM LRG 10X15 (GAUZE/BANDAGES/DRESSINGS) IMPLANT
ELECT REM PT RETURN 15FT ADLT (MISCELLANEOUS) ×2 IMPLANT
EVACUATOR 1/8 PVC DRAIN (DRAIN) IMPLANT
GAUZE SPONGE 4X4 12PLY STRL (GAUZE/BANDAGES/DRESSINGS) ×2 IMPLANT
GLOVE BIO SURGEON STRL SZ8.5 (GLOVE) ×4 IMPLANT
GLOVE BIOGEL M 7.0 STRL (GLOVE) ×2 IMPLANT
GLOVE BIOGEL PI IND STRL 7.5 (GLOVE) ×2 IMPLANT
GLOVE BIOGEL PI IND STRL 8 (GLOVE) ×2 IMPLANT
GLOVE BIOGEL PI IND STRL 8.5 (GLOVE) ×2 IMPLANT
GLOVE SURG LX STRL 7.5 STRW (GLOVE) ×4 IMPLANT
GOWN SPEC L3 XXLG W/TWL (GOWN DISPOSABLE) ×2 IMPLANT
HANDPIECE INTERPULSE COAX TIP (DISPOSABLE) ×1
IMMOBILIZER KNEE 20 (SOFTGOODS) ×1
IMMOBILIZER KNEE 20 THIGH 36 (SOFTGOODS) IMPLANT
KIT BASIN OR (CUSTOM PROCEDURE TRAY) ×2 IMPLANT
KIT TURNOVER KIT A (KITS) IMPLANT
MANIFOLD NEPTUNE II (INSTRUMENTS) ×2 IMPLANT
PACK TOTAL JOINT (CUSTOM PROCEDURE TRAY) ×2 IMPLANT
PADDING CAST COTTON 6X4 STRL (CAST SUPPLIES) ×2 IMPLANT
PROTECTOR NERVE ULNAR (MISCELLANEOUS) ×2 IMPLANT
SET HNDPC FAN SPRY TIP SCT (DISPOSABLE) ×2 IMPLANT
SOLUTION PRONTOSAN WOUND 350ML (IRRIGATION / IRRIGATOR) ×2 IMPLANT
STAPLER VISISTAT 35W (STAPLE) IMPLANT
SUT ETHILON 2 0 PSLX (SUTURE) IMPLANT
SUT MNCRL AB 4-0 PS2 18 (SUTURE) ×2 IMPLANT
SUT MON AB 2-0 SH 27 (SUTURE) ×2
SUT MON AB 2-0 SH27 (SUTURE) IMPLANT
SUT PDS AB 1 CTX 36 (SUTURE) IMPLANT
SUT VIC AB 1 CT1 36 (SUTURE) ×4 IMPLANT
SUT VIC AB 2-0 CT1 27 (SUTURE) ×3
SUT VIC AB 2-0 CT1 TAPERPNT 27 (SUTURE) ×6 IMPLANT
SWAB COLLECTION DEVICE MRSA (MISCELLANEOUS) ×2 IMPLANT
SWAB CULTURE ESWAB REG 1ML (MISCELLANEOUS) ×2 IMPLANT
TOWEL OR 17X26 10 PK STRL BLUE (TOWEL DISPOSABLE) ×4 IMPLANT

## 2022-06-04 NOTE — Progress Notes (Signed)
Pharmacy Antibiotic Note  Tammy [name] is a 67 y.o. female admitted on 06/04/2022 with left knee wound dehiscence. Underwent I&D, placement of wound VAC in OR today. Pharmacy has been consulted for Vancomycin and Zosyn dosing post-operatively.  Plan: Vancomycin 1g IV x 1 given at 1323. Start Vancomycin  IV q24h on 4/26. Goal AUC 400-550. Vancomycin levels at steady state, as indicated. Zosyn 3.375g IV q8h (each dose infused over 4 hours) Monitor renal function (daily SCr while on Vancomycin/Zosyn combination), cultures, clinical course.   Height:  (157.5 cm) Weight: 76 kg (167 lb 8.8 oz) IBW/kg (Calculated) : 50.1  Temp (24hrs), Avg:98.8 F (37.1 C), Min:98.4 F (36.9 C), Max:99.2 F (37.3 C)  Recent Labs  Lab 06/04/22 1201  CREATININE 0.90    Estimated Creatinine Clearance: 57.9 mL/min (by C-G formula based on SCr of 0.9 mg/dL).    Allergies  Allergen Reactions   Lisinopril Cough    Antimicrobials this admission: 4/25 Cefazolin x 1 4/25 Vancomycin >> 4/25 Zosyn >>  Dose adjustments this admission: --  Microbiology results: 4/25 L knee synovial fluid:   Thank you for allowing pharmacy to be a part of this patient's care.   Greer Pickerel, PharmD, BCPS Clinical Pharmacist 06/04/2022 6:29 PM

## 2022-06-04 NOTE — Op Note (Signed)
OPERATIVE REPORT   06/04/2022  4:18 PM  PATIENT:  Tammy [name]   SURGEON:  Jonette Pesa, MD  ASSISTANT:  Clint Bolder, PA-C.   PREOPERATIVE DIAGNOSIS:  1.  Wound edge necrosis, left knee. 2.  Superficial abscess, left knee.  POSTOPERATIVE DIAGNOSIS:  Same.  PROCEDURE:  1.  Debridement of skin and subcutaneous tissue left knee. 2.  Placement of wound VAC.  ANESTHESIA:   GETA.  ANTIBIOTICS: 2 g Ancef. 1 g vancomycin.  IMPLANTS: None.  SPECIMENS: 1. Left knee superficial wound swab for aerobic and anaerobic culture. 2.  Left knee subcutaneous tissue for aerobic and anaerobic culture.  COMPLICATIONS: None.  DISPOSITION: Stable to PACU.  SURGICAL INDICATIONS:  Tammy [name] is a 67 y.o. female who underwent hardware removal, left distal femur replacement on 04/30/2022.  Postoperatively, she was found to have ecchymosis to her lateral skin flap.  She was placed on oral antibiotics.  She presented to the office earlier this week with new onset of drainage from the incision.  She was indicated for debridement.  The risks, benefits, and alternatives were discussed with the patient preoperatively including but not limited to the risks of infection, bleeding, nerve / blood vessel injury, cardiopulmonary complications, the need for repeat surgery, among others, and the patient was willing to proceed.  PROCEDURE IN DETAIL: Patient was identified in the holding area using 2 identifiers.  The surgical site was marked by myself.  She was taken to the operating room and placed supine on the operating room table.  General anesthesia was obtained.  All bony prominences were well-padded.  A bump was placed under the left hip.  The left lower extremity was prepped and draped in the normal sterile surgical fashion.  Timeout was called, verifying site and site of surgery.  She did receive IV antibiotics within 60 minutes of beginning the procedure.  I began by examining  the left knee.  Over the distal pole of the patella, the lateral skin edge was necrotic and an area measuring 9 x 7 cm.  Just superior to the necrotic area there was a small pinhole that was draining serous fluid.  Using a #10 blade, I sharply excised all the necrotic skin into subcutaneous tissue, which was removed from the wound.  I also extended the incision a few centimeters proximally.  There was a small fluid collection just superior to this involved area, which was swabbed for aerobic and anaerobic culture.  There is a small amount of purulent material.  There was subcutaneous tunneling of the fluid collection laterally and proximally underneath her healed incision.  A representative sample of subcutaneous tissue was sent for aerobic and anaerobic culture.  At this point, I examined her arthrotomy.  At the superior pole the patella, there was a small rent in the fascia.  I removed a couple of fascial sutures and I inspected the knee joint.  There was no purulent material within the joint.  The knee was then irrigated with 6 L of normal saline using pulsatile lavage.  The arthrotomy was reapproximated with #1 PDS suture.  The superior part of the wound was closed with 2-0 Monocryl and staples.  I placed a black GranuFoam sponge into the lateral aspect of the wound proximally and also over the superior part of the wound.  Over the patellar tendon I placed the white VAC sponge.  I made a incisional dressing proximally using Adaptic and black GranuFoam sponge.  The VAC was then hooked up  to suction at 125 mmHg.  There was excellent seal without any leak.  A bulky dressing was placed with cast padding and an Ace wrap.  A knee immobilizer was applied.  The patient was then awakened from anesthesia and taken to the PACU in stable condition.  Sponge, needle, and instrument counts were correct at the end the case x 2.  There were no known complications.  POSTOPERATIVE PLAN: Postoperatively, the patient be admitted  to the orthopedic floor.  She may weight-bear as tolerated.  Knee immobilizer at all times; do not bend the knee.  We will follow her intraoperative cultures.  Broad-spectrum IV antibiotics for now.  I will discuss future treatment options with her.

## 2022-06-04 NOTE — Anesthesia Preprocedure Evaluation (Addendum)
Anesthesia Evaluation  Patient identified by MRN, date of birth, ID band Patient awake    Reviewed: Allergy & Precautions, NPO status , Patient's Chart, lab work & pertinent test results  History of Anesthesia Complications Negative for: history of anesthetic complications  Airway Mallampati: II  TM Distance: >3 FB Neck ROM: Full    Dental  (+) Missing, Dental Advisory Given   Pulmonary former smoker   breath sounds clear to auscultation       Cardiovascular hypertension, Pt. on medications (-) angina  Rhythm:Regular Rate:Normal     Neuro/Psych   Anxiety Depression Bipolar Disorder   CVA (L weakness), No Residual Symptoms    GI/Hepatic ,GERD  Controlled and Medicated,,(+) Cirrhosis       , Hepatitis -  Endo/Other  BMI 30  Renal/GU negative Renal ROS     Musculoskeletal   Abdominal  (+) + obese  Peds  Hematology  (+) Blood dyscrasia (Hb 10.2), anemia   Anesthesia Other Findings H/o breast cancer  Reproductive/Obstetrics                             Anesthesia Physical Anesthesia Plan  ASA: 3  Anesthesia Plan: General   Post-op Pain Management: Minimal or no pain anticipated   Induction: Intravenous  PONV Risk Score and Plan: 3 and Ondansetron, Dexamethasone and Treatment may vary due to age or medical condition  Airway Management Planned: LMA  Additional Equipment: None  Intra-op Plan:   Post-operative Plan:   Informed Consent: I have reviewed the patients History and Physical, chart, labs and discussed the procedure including the risks, benefits and alternatives for the proposed anesthesia with the patient or authorized representative who has indicated his/her understanding and acceptance.     Dental advisory given  Plan Discussed with: CRNA and Surgeon  Anesthesia Plan Comments: (Pt understands and accepts adductor canal block if infection is limited to her knee)         Anesthesia Quick Evaluation

## 2022-06-04 NOTE — Progress Notes (Signed)
Patient blood pressure arouind 170/90s. Tammy [name] notified. Patient will be taking her evening scheduling oral dose of hydralazine on the floor.

## 2022-06-04 NOTE — Transfer of Care (Signed)
Immediate Anesthesia Transfer of Care Note  Patient: Tammy [name]  Procedure(s) Performed: IRRIGATION AND DEBRIDEMENT KNEE (Left)  Patient Location: PACU  Anesthesia Type:General  Level of Consciousness: awake, alert , oriented, and patient cooperative  Airway & Oxygen Therapy: Patient Spontanous Breathing and Patient connected to face mask oxygen  Post-op Assessment: Report given to RN and Post -op Vital signs reviewed and stable  Post vital signs: Reviewed and stable  Last Vitals:  Vitals Value Taken Time  BP 166/85 06/04/22 1624  Temp    Pulse 72 06/04/22 1627  Resp 10 06/04/22 1627  SpO2 100 % 06/04/22 1627  Vitals shown include unvalidated device data.  Last Pain:  Vitals:   06/04/22 1155  TempSrc:   PainSc: 0-No pain         Complications: No notable events documented.

## 2022-06-04 NOTE — Anesthesia Procedure Notes (Signed)
Procedure Name: LMA Insertion Date/Time: 06/04/2022 2:55 PM  Performed by: Ponciano Ort, CRNAPre-anesthesia Checklist: Patient identified, Emergency Drugs available, Suction available and Patient being monitored Patient Re-evaluated:Patient Re-evaluated prior to induction Oxygen Delivery Method: Circle system utilized Preoxygenation: Pre-oxygenation with 100% oxygen Induction Type: IV induction LMA: LMA with gastric port inserted LMA Size: 4.0 Number of attempts: 1 Placement Confirmation: positive ETCO2 and breath sounds checked- equal and bilateral Tube secured with: Tape Dental Injury: Teeth and Oropharynx as per pre-operative assessment

## 2022-06-04 NOTE — H&P (Signed)
PREOPERATIVE H&P  Chief Complaint: Left knee wound dehiscence  HPI: Tammy [name] is a 67 y.o. 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 She presented to her routine postop follow up with an ecchymotic lateral skin edge, which we followed closely, and it subsequently necrosed.  She has elected for surgical management.   Past Medical History:  Diagnosis Date   Anemia    hx of   Anxiety    on meds   Arthritis    back/bilateral ankles   Bipolar disorder    Breast cancer 2022   LEFT breast-radiation   Depression    on meds   Family history of breast cancer 07/10/2020   Family history of colon cancer 07/10/2020   Family history of ovarian cancer 07/10/2020   GERD (gastroesophageal reflux disease)    on meds   Heart murmur    dx by PCP   Hepatitis C    HTN (hypertension)    on meds   Hx of radiation therapy    Liver cirrhosis    from hep c   Neuromuscular disorder    Osteopenia    Stroke 09/2012   slurred speech and LEFT sided weakness/notes 10/04/2012   Past Surgical History:  Procedure Laterality Date   ANKLE FRACTURE SURGERY Left 2018   BREAST LUMPECTOMY WITH RADIOACTIVE SEED AND SENTINEL LYMPH NODE BIOPSY Left 07/26/2020   Procedure: LEFT BREAST LUMPECTOMY WITH RADIOACTIVE SEED AND SENTINEL LYMPH NODE BIOPSY;  Surgeon: Abigail Miyamoto, MD;  Location: [facility];  Service: General;  Laterality: Left;   CESAREAN SECTION  1986   COLONOSCOPY     DIAGNOSTIC LAPAROSCOPY  1985   HARDWARE REMOVAL Left 04/30/2022   Procedure: HARDWARE REMOVAL LEFT FEMUR;  Surgeon: Samson Frederic, MD;  Location: [facility];  Service: Orthopedics;  Laterality: Left;  150   ORIF FEMUR FRACTURE Left 04/10/2021   Procedure: OPEN REDUCTION INTERNAL FIXATION (ORIF) DISTAL FEMUR FRACTURE;  Surgeon: Myrene Galas, MD;  Location: [facility];  Service: Orthopedics;  Laterality: Left;   PARTIAL KNEE ARTHROPLASTY Left 04/30/2022   Procedure: DISTAL  FEMUR REPLACEMENT;  Surgeon: Samson Frederic, MD;  Location: [facility];  Service: Orthopedics;  Laterality: Left;  150   Social History   Socioeconomic History   Marital status: Significant Other    Spouse name: Not on file   Number of children: 2   Years of education: Not on file   Highest education level: Not on file  Occupational History   Occupation: diabled  Tobacco Use   Smoking status: Former    Packs/day: 1.50    Years: 40.00    Additional pack years: 0.00    Total pack years: 60.00    Types: Cigarettes   Smokeless tobacco: Never   Tobacco comments:    has used e-cigarettes, no vaping   Vaping Use   Vaping Use: Never used  Substance and Sexual Activity   Alcohol use: Not Currently   Drug use: No   Sexual activity: Not Currently  Other Topics Concern   Not on file  Social History Narrative   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   Caffeine: 1 cup in the morning    Social Determinants of Health   Financial Resource Strain: Not on file  Food Insecurity: Not on file  Transportation Needs: Not on file  Physical Activity: Not on file  Stress: Not on file  Social Connections: Not on file  Family History  Problem Relation Age of Onset   Other Mother        blood clots   Diabetes Sister    Neuropathy Sister        diabetic neuropathy   Diabetes Brother    Neuropathy Brother        diabetic neuropathy   Breast cancer Maternal Aunt        81   Colon cancer Maternal Aunt 35   Diabetes Paternal Aunt    Breast cancer Other        PGM's sister; dx mid 57s   Colon cancer Other        PGM's brother; dx 34s   Ovarian cancer Other        PGM's mother; dx unknown age   Esophageal cancer Neg Hx    Rectal cancer Neg Hx    Stomach cancer Neg Hx    Colon polyps Neg Hx    Allergies  Allergen Reactions   Lisinopril Cough   Prior to Admission medications   Medication Sig Start Date End Date Taking? Authorizing Provider  acetaminophen (TYLENOL)  500 MG tablet Take 1,000 mg by mouth every 6 (six) hours as needed for moderate pain.   Yes [provider]  amLODipine (NORVASC) 10 MG tablet Take 1 tablet (10 mg total) by mouth daily. 04/22/21  Yes Setzer, Lynnell Jude, PA-C  anastrozole (ARIMIDEX) 1 MG tablet TAKE 1 TABLET BY MOUTH DAILY 03/16/22  Yes Causey, Larna Daughters, NP  Ascorbic Acid (VITAMIN C) 1000 MG tablet Take 1,000 mg by mouth daily.   Yes [provider]  aspirin (ASPIRIN CHILDRENS) 81 MG chewable tablet Chew 1 tablet (81 mg total) by mouth 2 (two) times daily with a meal. 05/05/22 06/19/22 Yes Hill, Alain Honey, PA-C  buPROPion (WELLBUTRIN SR) 150 MG 12 hr tablet Take 150 mg by mouth 2 (two) times daily. 10/04/21  Yes [provider]  CALCIUM PO Take 500 mg by mouth daily.   Yes [provider]  cefadroxil (DURICEF) 500 MG capsule Take 500 mg by mouth 2 (two) times daily. 06/01/22  Yes [provider]  cyanocobalamin (VITAMIN B12) 1000 MCG tablet Take 1,000 mcg by mouth daily.   Yes [provider]  docusate sodium (COLACE) 100 MG capsule Take 1 capsule (100 mg total) by mouth 2 (two) times daily. 05/05/22 06/04/22 Yes Hill, Alain Honey, PA-C  gabapentin (NEURONTIN) 100 MG capsule Take 1 capsule (100 mg total) by mouth 2 (two) times daily. Patient taking differently: Take 100-300 mg by mouth See admin instructions. Take 100 mg in the morning and 300 mg at bedtime 04/22/21  Yes Setzer, Lynnell Jude, PA-C  hydrALAZINE (APRESOLINE) 25 MG tablet Take 1 tablet (25 mg total) by mouth 2 (two) times daily. 04/22/21  Yes Setzer, Lynnell Jude, PA-C  meloxicam (MOBIC) 15 MG tablet Take 1 tablet (15 mg total) by mouth daily. 05/05/22 05/05/23 Yes Hill, Alain Honey, PA-C  Menthol, Topical Analgesic, (BENGAY EX) Apply 1 Application topically daily as needed (pain).   Yes [provider]  Multiple Vitamin (MULTIVITAMIN) TABS Take 1 tablet by mouth daily. 04/22/21  Yes Setzer, Lynnell Jude, PA-C  pantoprazole (PROTONIX) 40  MG tablet Take 1 tablet (40 mg total) by mouth daily. 04/22/21  Yes Setzer, Lynnell Jude, PA-C  polyethylene glycol (MIRALAX) 17 g packet Take 17 g by mouth daily as needed for mild constipation or moderate constipation. 05/05/22 06/04/22 Yes Hill, Alain Honey, PA-C  potassium chloride (KLOR-CON) 10  MEQ tablet Take 10 mEq by mouth daily. 04/07/22  Yes [provider]  REGLAN 10 MG tablet Take 10 mg by mouth daily as needed for nausea. 05/18/22  Yes [provider]  senna (SENOKOT) 8.6 MG TABS tablet Take 1 tablet by mouth.   Yes [provider]  zinc gluconate 50 MG tablet Take 50 mg by mouth daily.   Yes [provider]  HYDROcodone-acetaminophen (NORCO/VICODIN) 5-325 MG tablet Take 1 tablet by mouth every 4 (four) hours as needed for moderate pain or severe pain. 05/28/22   [provider]  ondansetron (ZOFRAN) 4 MG tablet Take 1 tablet (4 mg total) by mouth every 8 (eight) hours as needed for nausea or vomiting. Patient not taking: Reported on 06/03/2022 05/05/22 05/05/23  Clois Dupes, PA-C     Positive ROS: All other systems have been reviewed and were otherwise negative with the exception of those mentioned in the HPI and as above.  Physical Exam: General: Alert, no acute distress Cardiovascular: No pedal edema Respiratory: No cyanosis, no use of accessory musculature GI: No organomegaly, abdomen is soft and non-tender Skin: No lesions in the area of chief complaint Neurologic: Sensation intact distally Psychiatric: Patient is competent for consent with normal mood and affect Lymphatic: No axillary or cervical lymphadenopathy  MUSCULOSKELETAL: necrotic lateral skin edge. No effusion. NVI.  Assessment: Left knee wound dehiscence  Plan: Plan for Procedure(s): IRRIGATION AND DEBRIDEMENT KNEE  The risks benefits and alternatives were discussed with the patient including but not limited to the risks of nonoperative treatment, versus surgical intervention  including infection, bleeding, repeat surgical procedures,  blood clots, cardiopulmonary complications, morbidity, mortality, among others, and they were willing to proceed.   She will very likely need postoperative plastic surgery consultation for closure.  Jonette Pesa, MD [phone]   06/04/2022 1:01 PM

## 2022-06-04 NOTE — Anesthesia Postprocedure Evaluation (Signed)
Anesthesia Post Note  Patient: Tammy [name]  Procedure(s) Performed: IRRIGATION AND DEBRIDEMENT KNEE (Left)     Patient location during evaluation: PACU Anesthesia Type: General Level of consciousness: awake and alert, patient cooperative and oriented Pain management: pain level controlled Vital Signs Assessment: post-procedure vital signs reviewed and stable Respiratory status: spontaneous breathing, nonlabored ventilation and respiratory function stable Cardiovascular status: blood pressure returned to baseline and stable Postop Assessment: no apparent nausea or vomiting Anesthetic complications: no   No notable events documented.  Last Vitals:  Vitals:   06/04/22 1700 06/04/22 1715  BP: (!) 160/88 (!) 171/95  Pulse: 80 79  Resp: 15 11  Temp:    SpO2: 100% 100%    Last Pain:  Vitals:   06/04/22 1715  TempSrc:   PainSc: 2                  Jaysha Lasure,E. Theadora Noyes

## 2022-06-05 ENCOUNTER — Telehealth: Payer: Self-pay

## 2022-06-05 ENCOUNTER — Encounter (HOSPITAL_COMMUNITY): Payer: Self-pay | Admitting: Orthopedic Surgery

## 2022-06-05 ENCOUNTER — Ambulatory Visit: Payer: Self-pay | Admitting: Student

## 2022-06-05 DIAGNOSIS — F17211 Nicotine dependence, cigarettes, in remission: Secondary | ICD-10-CM | POA: Diagnosis not present

## 2022-06-05 DIAGNOSIS — S81002A Unspecified open wound, left knee, initial encounter: Secondary | ICD-10-CM

## 2022-06-05 LAB — BASIC METABOLIC PANEL
Anion gap: 11 (ref 5–15)
BUN: 12 mg/dL (ref 8–23)
CO2: 24 mmol/L (ref 22–32)
Calcium: 8.1 mg/dL — ABNORMAL LOW (ref 8.9–10.3)
Chloride: 102 mmol/L (ref 98–111)
Creatinine, Ser: 0.99 mg/dL (ref 0.44–1.00)
GFR, Estimated: >60 mL/min (ref >60)
Glucose, Bld: 141 mg/dL — ABNORMAL HIGH (ref 70–99)
Potassium: 2.8 mmol/L — ABNORMAL LOW (ref 3.5–5.1)
Sodium: 137 mmol/L (ref 135–145)

## 2022-06-05 LAB — TYPE AND SCREEN: ABO/RH(D): AB POS

## 2022-06-05 LAB — CBC
HCT: 20.4 % — ABNORMAL LOW (ref 36.0–46.0)
Hemoglobin: 6.5 g/dL — CL (ref 12.0–15.0)
MCH: 28.3 pg (ref 26.0–34.0)
MCHC: 31.9 g/dL (ref 30.0–36.0)
MCV: 88.7 fL (ref 80.0–100.0)
Platelets: 344 10*3/uL (ref 150–400)
RBC: 2.3 MIL/uL — ABNORMAL LOW (ref 3.87–5.11)
RDW: 14.8 % (ref 11.5–15.5)
WBC: 5.7 10*3/uL (ref 4.0–10.5)
nRBC: 0 % (ref 0.0–0.2)

## 2022-06-05 LAB — BPAM RBC
Blood Product Expiration Date: 202405222359
Blood Product Expiration Date: 202405222359
ISSUE DATE / TIME: 202404260736

## 2022-06-05 LAB — HEMOGLOBIN AND HEMATOCRIT, BLOOD
HCT: 26.6 % — ABNORMAL LOW (ref 36.0–46.0)
Hemoglobin: 8.6 g/dL — ABNORMAL LOW (ref 12.0–15.0)

## 2022-06-05 LAB — PREPARE RBC (CROSSMATCH)

## 2022-06-05 LAB — AEROBIC/ANAEROBIC CULTURE W GRAM STAIN (SURGICAL/DEEP WOUND)

## 2022-06-05 MED ORDER — POTASSIUM CHLORIDE CRYS ER 20 MEQ PO TBCR
40.0000 meq | EXTENDED_RELEASE_TABLET | Freq: Once | ORAL | Status: AC
  Administered 2022-06-05: 40 meq via ORAL
  Filled 2022-06-05: qty 2

## 2022-06-05 MED ORDER — SODIUM CHLORIDE 0.9% IV SOLUTION: Freq: Once | INTRAVENOUS | Status: DC

## 2022-06-05 NOTE — Progress Notes (Signed)
PT Cancellation Note  Patient Details Name: Tammy [name] MRN: [id number] DOB: 18-Apr-1955   Cancelled Treatment:    Reason Eval/Treat Not Completed: Medical issues which prohibited therapy (Hgb 6.5, pt to receive blood, will check back later today.)   [name] PT 06/05/2022  Acute Rehabilitation Services  Office [phone]

## 2022-06-05 NOTE — Evaluation (Addendum)
Physical Therapy Evaluation Patient Details Name: Tammy [name] MRN: [id number] DOB: 1955/04/10 Today's Date: 06/05/2022  History of Present Illness  67 y.o. 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 She is s/p left distal femur ORIF fracture 04/10/21 with Dr. 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 s/p removal of implant and replacement of L distal femur 04/30/22. Now admitted 06/04/22 with necrotic wound, s/p surgical debridelment.  PMH: bipolar, CVA.  Clinical Impression  Pt admitted with above diagnosis. Min/guard assist for stand pivot transfer to recliner with RW and L KI. Noted plan to return to surgery tomorrow, will reassess following surgery.  Pt currently with functional limitations due to the deficits listed below (see PT Problem List). Pt will benefit from acute skilled PT to increase their independence and safety with mobility to allow discharge.          Recommendations for follow up therapy are one component of a multi-disciplinary discharge planning process, led by the attending physician.  Recommendations may be updated based on patient status, additional functional criteria and insurance authorization.  Follow Up Recommendations       Assistance Recommended at Discharge Intermittent Supervision/Assistance  Patient can return home with the following  A little help with bathing/dressing/bathroom;Assist for transportation;Help with stairs or ramp for entrance;Assistance with cooking/housework    Equipment Recommendations None recommended by PT  Recommendations for Other Services       Functional Status Assessment Patient has had a recent decline in their functional status and demonstrates the ability to make  significant improvements in function in a reasonable and predictable amount of time.     Precautions / Restrictions Precautions Precautions: Fall;Other (comment) Precaution Comments: wound vac L knee Required Braces or Orthoses: Knee Immobilizer - Left Knee Immobilizer - Left: On at all times (on at all times except for hygiene) Restrictions Weight Bearing Restrictions: No LLE Weight Bearing: Weight bearing as tolerated      Mobility  Bed Mobility Overal bed mobility: Modified Independent Bed Mobility: Supine to Sit     Supine to sit: HOB elevated, Modified independent (Device/Increase time)          Transfers Overall transfer level: Needs assistance Equipment used: Rolling walker (2 wheels) Transfers: Sit to/from Stand, Bed to chair/wheelchair/BSC Sit to Stand: Min guard Stand pivot transfers: Min guard         General transfer comment: VCs hand placement, steady, no loss of balance, KI on LLE    Ambulation/Gait                  Stairs            Wheelchair Mobility    Modified Rankin (Stroke Patients Only)       Balance Overall balance assessment: Modified Independent                                           Pertinent Vitals/Pain Pain Assessment Pain Score: 0-No pain Pain Intervention(s): Repositioned    Home Living Family/patient expects to be discharged to:: Private residence Living Arrangements: Spouse/significant other Available Help at Discharge: Family;Available 24 hours/day   Home Access: Stairs to  enter Entrance Stairs-Rails: Can reach both;Left;Right Entrance Stairs-Number of Steps: 5   Home Layout: One level Home Equipment: Agricultural consultant (2 wheels);Rollator (4 wheels);Shower seat;Hand held shower head;Toilet riser      Prior Function Prior Level of Function : Independent/Modified Independent             Mobility Comments: walked with RW ADLs Comments: independent, used shower seat      Hand Dominance        Extremity/Trunk Assessment   Upper Extremity Assessment Upper Extremity Assessment: Overall WFL for tasks assessed    Lower Extremity Assessment Lower Extremity Assessment: LLE deficits/detail LLE Deficits / Details: KI on L knee at all times, SLR +2/5 LLE: Unable to fully assess due to immobilization LLE Sensation: WNL    Cervical / Trunk Assessment Cervical / Trunk Assessment: Normal  Communication   Communication: No difficulties  Cognition Arousal/Alertness: Awake/alert Behavior During Therapy: WFL for tasks assessed/performed Overall Cognitive Status: Within Functional Limits for tasks assessed                                          General Comments      Exercises     Assessment/Plan    PT Assessment Patient needs continued PT services  PT Problem List Decreased range of motion;Decreased strength;Decreased mobility;Decreased activity tolerance;Decreased balance       PT Treatment Interventions Gait training;Therapeutic activities;DME instruction;Stair training;Therapeutic exercise;Patient/family education    PT Goals (Current goals can be found in the Care Plan section)  Acute Rehab PT Goals Patient Stated Goal: go to [place], get married without using a walker PT Goal Formulation: With patient Time For Goal Achievement: 05/08/22 Potential to Achieve Goals: Good    Frequency Min 1X/week     Co-evaluation               AM-PAC PT "6 Clicks" Mobility  Outcome Measure Help needed turning from your back to your side while in a flat bed without using bedrails?: None Help needed moving from lying on your back to sitting on the side of a flat bed without using bedrails?: A Little Help needed moving to and from a bed to a chair (including a wheelchair)?: A Little Help needed standing up from a chair using your arms (e.g., wheelchair or bedside chair)?: A Little Help needed to walk in hospital room?: A Lot Help  needed climbing 3-5 steps with a railing? : A Lot 6 Click Score: 17    End of Session Equipment Utilized During Treatment: Gait belt Activity Tolerance: Patient limited by pain Patient left: in chair;with call bell/phone within reach;with chair alarm set Nurse Communication: Mobility status PT Visit Diagnosis: Difficulty in walking, not elsewhere classified (R26.2);Other abnormalities of gait and mobility (R26.89)    Time: 9604-5409 PT Time Calculation (min) (ACUTE ONLY): 30 min   Charges:   PT Evaluation $PT Eval Moderate Complexity: 1 Mod PT Treatments $Therapeutic Activity: 8-22 mins      Ralene Bathe Kistler PT 06/05/2022  Acute Rehabilitation Services  Office [phone]

## 2022-06-05 NOTE — Telephone Encounter (Signed)
Pt called stating that she's been in the hospital for several weeks and was scheduled to see Dr. Mosetta Putt earlier this month which she missed her appt.  Pt stated she was in the hospital; thus, why the appt was missed and she does not want Dr. Mosetta Putt to think she does not want to see her.  Pt stated she's admitted to [facility] and had surgery yesterday.  Pt stated she's suppose to have another surgery tomorrow 06/06/2022.  Pt stated she does not know when she's going to be discharged but wanted to make Dr. Mosetta Putt aware of her hospitalization.  Pt also stated she missed her mammogram appts as well d/t her hospitalization.  Informed pt that this RN will make Dr. Mosetta Putt aware of her current hospitalization.  Pt verbalized understanding and had no further questions or concerns.

## 2022-06-05 NOTE — Plan of Care (Signed)

## 2022-06-05 NOTE — H&P (View-Only) (Signed)
[facility]  Reason for Consult:knee wound Referring Physician: Dr. Swinteck  Tammy [name] is an 67 y.o. female.  HPI: This is a 67 YOF seen today for evaluation of left lower extremity wound. The patient notes that she suffered a left distal femur fracture approximately one year ago. She underwent  ORIF on 04/10/21 with Dr. Handy. She reports doing well but after waking up from a colonoscopy she had pain in her left leg at the site of the previous fracture. 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 She was taken back to the OR for left DRF on 04/30/2022 with Dr. Swinteck. She followed up in clinic postoperatively and was noted to have some wound breakdown. She was taken to the OR on 06/04/2022 for irrigation and debridement with placement of wound vac. She is scheduled for repeat irrigation and debridement with poly liner exchange tomorrow.    Past Medical History:  Diagnosis Date   Anemia    hx of   Anxiety    on meds   Arthritis    back/bilateral ankles   Bipolar disorder (HCC)    Breast cancer (HCC) 2022   LEFT breast-radiation   Depression    on meds   Family history of breast cancer 07/10/2020   Family history of colon cancer 07/10/2020   Family history of ovarian cancer 07/10/2020   GERD (gastroesophageal reflux disease)    on meds   Heart murmur    dx by PCP   Hepatitis C    HTN (hypertension)    on meds   Hx of radiation therapy    Liver cirrhosis (HCC)    from hep c   Neuromuscular disorder (HCC)    Osteopenia    Stroke (HCC) 09/2012   slurred speech and LEFT sided weakness/notes 10/04/2012    Past Surgical History:  Procedure Laterality Date   ANKLE FRACTURE SURGERY Left 2018   BREAST LUMPECTOMY WITH RADIOACTIVE SEED AND SENTINEL LYMPH NODE BIOPSY Left 07/26/2020   Procedure: LEFT BREAST LUMPECTOMY WITH RADIOACTIVE SEED AND SENTINEL LYMPH NODE BIOPSY;  Surgeon: Blackman, Douglas, MD;   Location: [facility];  Service: General;  Laterality: Left;   CESAREAN SECTION  1986   COLONOSCOPY     DIAGNOSTIC LAPAROSCOPY  1985   HARDWARE REMOVAL Left 04/30/2022   Procedure: HARDWARE REMOVAL LEFT FEMUR;  Surgeon: Swinteck, Brian, MD;  [facility];  Service: Orthopedics;  Laterality: Left;  150   INCISION AND DRAINAGE OF WOUND Left 06/04/2022   Procedure: IRRIGATION AND DEBRIDEMENT KNEE;  Surgeon: Swinteck, Brian, MD;  Location: [facility];  Service: Orthopedics;  Laterality: Left;  60   ORIF FEMUR FRACTURE Left 04/10/2021   Procedure: OPEN REDUCTION INTERNAL FIXATION (ORIF) DISTAL FEMUR FRACTURE;  Surgeon: Handy, Michael, MD;  [facility];  Service: Orthopedics;  Laterality: Left;   PARTIAL KNEE ARTHROPLASTY Left 04/30/2022   Procedure: DISTAL FEMUR REPLACEMENT;  Surgeon: Swinteck, Brian, MD;  [facility];  Service: Orthopedics;  Laterality: Left;  150    Family History  Problem Relation Age of Onset   Other Mother        blood clots   Diabetes Sister    Neuropathy Sister        diabetic neuropathy   Diabetes Brother    Neuropathy Brother        diabetic neuropathy   Breast cancer Maternal Aunt          50   Colon cancer Maternal Aunt 69   Diabetes Paternal Aunt    Breast cancer Other        PGM's sister; dx mid 70s   Colon cancer Other        PGM's brother; dx 70s   Ovarian cancer Other        PGM's mother; dx unknown age   Esophageal cancer Neg Hx    Rectal cancer Neg Hx    Stomach cancer Neg Hx    Colon polyps Neg Hx     Social History:  reports that she has quit smoking. Her smoking use included cigarettes. She has a 60.00 pack-year smoking history. She has never used smokeless tobacco. She reports that she does not currently use alcohol. She reports that she does not use drugs.  Allergies:  Allergies  Allergen Reactions   Lisinopril Cough    Medications:   Results for orders placed or performed during the hospital encounter of 06/04/22  (from the past 48 hour(s))  I-STAT, chem 8     Status: Abnormal   Collection Time: 06/04/22 12:01 PM  Result Value Ref Range   Sodium 142 135 - 145 mmol/L   Potassium 3.3 (L) 3.5 - 5.1 mmol/L   Chloride 104 98 - 111 mmol/L   BUN 9 8 - 23 mg/dL   Creatinine, Ser 0.90 0.44 - 1.00 mg/dL   Glucose, Bld 97 70 - 99 mg/dL    Comment: Glucose reference range applies only to samples taken after fasting for at least 8 hours.   Calcium, Ion 1.15 1.15 - 1.40 mmol/L   TCO2 27 22 - 32 mmol/L   Hemoglobin 10.2 (L) 12.0 - 15.0 g/dL   HCT 30.0 (L) 36.0 - 46.0 %  Aerobic/Anaerobic Culture w Gram Stain (surgical/deep wound)     Status: None (Preliminary result)   Collection Time: 06/04/22  3:38 PM   Specimen: Synovial, Left Knee; Body Fluid  Result Value Ref Range   Specimen Description      WOUND Performed at [facility], [address] [place], [place] [address]    Special Requests      LEFT KNEE Performed at [facility], [address] [place], [place] [address]    Gram Stain      ABUNDANT WBC PRESENT, PREDOMINANTLY PMN NO ORGANISMS SEEN    Culture      CULTURE REINCUBATED FOR BETTER GROWTH Performed at [facility], [address] [place], [place] [address]    Report Status PENDING   Aerobic/Anaerobic Culture w Gram Stain (surgical/deep wound)     Status: None (Preliminary result)   Collection Time: 06/04/22  3:50 PM   Specimen: Synovial, Left Knee; Body Fluid  Result Value Ref Range   Specimen Description      WOUND Performed at [facility], [address] [place], [place] [address]    Special Requests      LEFT KNEE Performed at [facility], [address] [place], [place] [address]    Gram Stain      FEW WBC PRESENT, PREDOMINANTLY PMN NO ORGANISMS SEEN    Culture      CULTURE REINCUBATED FOR BETTER GROWTH Performed at [facility], [address] [place],  [address]     Report Status PENDING   Type and screen Order type and screen if day of surgery is less than 15 days from draw of preadmission visit or order   morning of surgery if day of surgery is greater than 6 days from preadmission visit.     Status: None (Preliminary result)   Collection Time: 06/04/22  4:32 PM  Result Value Ref Range   ABO/RH(D) AB POS    Antibody Screen NEG    Sample Expiration 06/07/2022,2359    Unit Number [id number]    Blood Component Type RED CELLS,LR    Unit division 00    Status of Unit ISSUED    Transfusion Status OK TO TRANSFUSE    Crossmatch Result      Compatible Performed at [facility], [address] [place], [place] [address]    Unit Number [id number]    Blood Component Type RBC LR PHER2    Unit division 00    Status of Unit ISSUED    Transfusion Status OK TO TRANSFUSE    Crossmatch Result Compatible   CBC     Status: Abnormal   Collection Time: 06/05/22  3:27 AM  Result Value Ref Range   WBC 5.7 4.0 - 10.5 K/uL   RBC 2.30 (L) 3.87 - 5.11 MIL/uL   Hemoglobin 6.5 (LL) 12.0 - 15.0 g/dL    Comment: REPEATED TO VERIFY THIS CRITICAL RESULT HAS VERIFIED AND BEEN CALLED TO C. ANDERSON RN BY ATCHISON,MARY ON 04 26 2024 AT 0406, AND HAS BEEN READ BACK.     HCT 20.4 (L) 36.0 - 46.0 %   MCV 88.7 80.0 - 100.0 fL   MCH 28.3 26.0 - 34.0 pg   MCHC 31.9 30.0 - 36.0 g/dL   RDW 14.8 11.5 - 15.5 %   Platelets 344 150 - 400 K/uL   nRBC 0.0 0.0 - 0.2 %    Comment: Performed at [facility], [address] [place], [place] [address]  Basic metabolic panel     Status: Abnormal   Collection Time: 06/05/22  3:27 AM  Result Value Ref Range   Sodium 137 135 - 145 mmol/L   Potassium 2.8 (L) 3.5 - 5.1 mmol/L   Chloride 102 98 - 111 mmol/L   CO2 24 22 - 32 mmol/L   Glucose, Bld 141 (H) 70 - 99 mg/dL    Comment: Glucose reference range applies only to samples taken after fasting for at least 8 hours.   BUN 12 8 - 23 mg/dL    Creatinine, Ser 0.99 0.44 - 1.00 mg/dL   Calcium 8.1 (L) 8.9 - 10.3 mg/dL   GFR, Estimated >60 >60 mL/min    Comment: (NOTE) Calculated using the CKD-EPI Creatinine Equation (2021)    Anion gap 11 5 - 15    Comment: Performed at [facility], [address] [place], [place] [address]  Prepare RBC (crossmatch)     Status: None   Collection Time: 06/05/22  6:03 AM  Result Value Ref Range   Order Confirmation      ORDER PROCESSED BY BLOOD BANK Performed at [facility], [address] [place],  [address]     No results found.  ROS Positive for knee wound Blood pressure (!) 158/76, pulse 64, temperature 98.7 F (37.1 C), temperature source Oral, resp. rate 20, height 5' 2" (1.575 m), weight 76 kg, SpO2 100 %. Physical Exam Left lower extremity with wound vac in place, ace bandage in place, CDI. Distal cap refill intact  Assessment/Plan:  This is a 67 YOF seen today for wound of left leg. The plan is for OR management with orthopedics   tomorrow.  We will review intraoperative images of the wound and discuss care plan next week. We will continue to follow.     Jamyah Folk Todd Meztli Llanas, PA-C 06/05/2022, 3:32 PM   

## 2022-06-05 NOTE — Progress Notes (Signed)
Subjective:  Patient reports pain as mild to moderate.  Denies N/V/CP/SOB/Abd pain. She reports her pain is well controlled with the hydrocodone. Denies any tinging or numbness in LE bilaterally.     Objective:   VITALS:   Vitals:   06/05/22 1042 06/05/22 1121 06/05/22 1153 06/05/22 1402  BP: (!) 165/81 (!) 179/92 (!) 171/80 (!) 155/74  Pulse: 65 72 66 64  Resp: 18 20 20 18   Temp: 98.5 F (36.9 C) 98.1 F (36.7 C) 99 F (37.2 C) 98.7 F (37.1 C)  TempSrc: Oral Oral Oral   SpO2: 100% 100% 100% 97%  Weight:      Height:        Patient sitting in recliner. NAD. She is currently receiving 2 units PRBCs.  Neurologically intact ABD soft Neurovascular intact Sensation intact distally Intact pulses distally Dorsiflexion/Plantar flexion intact No cellulitis present Compartment soft Negative pressure dressing C/D/I. No leaks detected. Wound vac . 200 cc SS output.  Knee immobilizer on.    Lab Results  Component Value Date   WBC 5.7 06/05/2022   HGB 6.5 (LL) 06/05/2022   HCT 20.4 (L) 06/05/2022   MCV 88.7 06/05/2022   PLT 344 06/05/2022   BMET    Component Value Date/Time   NA 137 06/05/2022 0327   K 2.8 (L) 06/05/2022 0327   CL 102 06/05/2022 0327   CO2 24 06/05/2022 0327   GLUCOSE 141 (H) 06/05/2022 0327   BUN 12 06/05/2022 0327   CREATININE 0.99 06/05/2022 0327   CREATININE 0.76 07/10/2020 1220   CALCIUM 8.1 (L) 06/05/2022 0327   GFRNONAA >60 06/05/2022 0327   GFRNONAA >60 07/10/2020 1220   Results for orders placed or performed during the hospital encounter of 06/04/22  Aerobic/Anaerobic Culture w Gram Stain (surgical/deep wound)     Status: None (Preliminary result)   Collection Time: 06/04/22  3:38 PM   Specimen: Synovial, Left Knee; Body Fluid  Result Value Ref Range Status   Specimen Description   Final    WOUND Performed at [name], [address], [place], [place] [address]    Special Requests   Final    LEFT  KNEE Performed at [facility], [address], [place], [place] [address]    Gram Stain   Final    ABUNDANT WBC PRESENT, PREDOMINANTLY PMN NO ORGANISMS SEEN    Culture   Final    CULTURE REINCUBATED FOR BETTER GROWTH Performed at [facility], [address], [place], [place] [address]    Report Status PENDING  Incomplete  Aerobic/Anaerobic Culture w Gram Stain (surgical/deep wound)     Status: None (Preliminary result)   Collection Time: 06/04/22  3:50 PM   Specimen: Synovial, Left Knee; Body Fluid  Result Value Ref Range Status   Specimen Description   Final    WOUND Performed at [facility], [address], [place], [place] [address]    Special Requests   Final    LEFT KNEE Performed at [facility], [address], [place], [place] [address]    Gram Stain   Final    FEW WBC PRESENT, PREDOMINANTLY PMN NO ORGANISMS SEEN    Culture   Final    CULTURE REINCUBATED FOR BETTER GROWTH Performed at [facility], [address], [place], [place] [address]    Report Status PENDING  Incomplete     Assessment/Plan: 1 Day Post-Op   Principal Problem:  Surgical wound dehiscence  ABLA. Hemoglobin 6.5 this morning. 2 units PRBCs ordered. Patient asymptomatic. Continue to monitor. Recheck H&H post transfusion.   Hypokalemia. Potassium 2.8 this morning. Kdurr 40 mEq dose ordered. She is on daily 10 mEq potassium.   WBAT with walker and Knee Immobilizer do not bend the knee.  DVT ppx: Aspirin, SCDs, TEDS PO pain control PT/OT Dispo:  - Intraoperative cultures pending. Continue to monitor. Continue broad spectrum IV antibiotics, will tailor based on culture results.  - Continue wound vac and monitor output.  - Spoke with patient about surgical plans. She will need repeat I&D with poly liner exchange. Plan for tomorrow morning. Hold chemical DVT ppx. Patient to be NPO after midnight tonight. All questions  solicited and answered.   Clois Dupes, PA-C 06/05/2022, 2:43 PM   [facility]  [place] [address], [place], [place] [address] Phone: [phone] [email or web address] Facebook  [employer]

## 2022-06-05 NOTE — Consult Note (Signed)
[facility]  Reason for Consult:knee wound Referring Physician: Dr. Emilee Hero Tammy [name] is an 67 y.o. female.  HPI: This is a 81 YOF seen today for evaluation of left lower extremity wound. The patient notes that she suffered a left distal femur fracture approximately one year ago. She underwent  ORIF on 04/10/21 with Dr. Carola Frost. She reports doing well but after waking up from a colonoscopy she had pain in her left leg at the site of the previous fracture. 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 She was taken back to the OR for left DRF on 04/30/2022 with Dr. [name]. She followed up in clinic postoperatively and was noted to have some wound breakdown. She was taken to the OR on 06/04/2022 for irrigation and debridement with placement of wound vac. She is scheduled for repeat irrigation and debridement with poly liner exchange tomorrow.    Past Medical History:  Diagnosis Date   Anemia    hx of   Anxiety    on meds   Arthritis    back/bilateral ankles   Bipolar disorder (HCC)    Breast cancer (HCC) 2022   LEFT breast-radiation   Depression    on meds   Family history of breast cancer 07/10/2020   Family history of colon cancer 07/10/2020   Family history of ovarian cancer 07/10/2020   GERD (gastroesophageal reflux disease)    on meds   Heart murmur    dx by PCP   Hepatitis C    HTN (hypertension)    on meds   Hx of radiation therapy    Liver cirrhosis (HCC)    from hep c   Neuromuscular disorder (HCC)    Osteopenia    Stroke (HCC) 09/2012   slurred speech and LEFT sided weakness/notes 10/04/2012    Past Surgical History:  Procedure Laterality Date   ANKLE FRACTURE SURGERY Left 2018   BREAST LUMPECTOMY WITH RADIOACTIVE SEED AND SENTINEL LYMPH NODE BIOPSY Left 07/26/2020   Procedure: LEFT BREAST LUMPECTOMY WITH RADIOACTIVE SEED AND SENTINEL LYMPH NODE BIOPSY;  Surgeon: Abigail Miyamoto, MD;   Location: [facility];  Service: General;  Laterality: Left;   CESAREAN SECTION  1986   COLONOSCOPY     DIAGNOSTIC LAPAROSCOPY  1985   HARDWARE REMOVAL Left 04/30/2022   Procedure: HARDWARE REMOVAL LEFT FEMUR;  Surgeon: Samson Frederic, MD;  [facility];  Service: Orthopedics;  Laterality: Left;  150   INCISION AND DRAINAGE OF WOUND Left 06/04/2022   Procedure: IRRIGATION AND DEBRIDEMENT KNEE;  Surgeon: Samson Frederic, MD;  [facility];  Service: Orthopedics;  Laterality: Left;  60   ORIF FEMUR FRACTURE Left 04/10/2021   Procedure: OPEN REDUCTION INTERNAL FIXATION (ORIF) DISTAL FEMUR FRACTURE;  Surgeon: Myrene Galas, MD;  [facility];  Service: Orthopedics;  Laterality: Left;   PARTIAL KNEE ARTHROPLASTY Left 04/30/2022   Procedure: DISTAL FEMUR REPLACEMENT;  Surgeon: Samson Frederic, MD;  [facility];  Service: Orthopedics;  Laterality: Left;  150    Family History  Problem Relation Age of Onset   Other Mother        blood clots   Diabetes Sister    Neuropathy Sister        diabetic neuropathy   Diabetes Brother    Neuropathy Brother        diabetic neuropathy   Breast cancer Maternal Aunt  50   Colon cancer Maternal Aunt 69   Diabetes Paternal Aunt    Breast cancer Other        PGM's sister; dx mid 64s   Colon cancer Other        PGM's brother; dx 65s   Ovarian cancer Other        PGM's mother; dx unknown age   Esophageal cancer Neg Hx    Rectal cancer Neg Hx    Stomach cancer Neg Hx    Colon polyps Neg Hx     Social History:  reports that she has quit smoking. Her smoking use included cigarettes. She has a 60.00 pack-year smoking history. She has never used smokeless tobacco. She reports that she does not currently use alcohol. She reports that she does not use drugs.  Allergies:  Allergies  Allergen Reactions   Lisinopril Cough    Medications:   Results for orders placed or performed during the hospital encounter of 06/04/22  (from the past 48 hour(s))  I-STAT, chem 8     Status: Abnormal   Collection Time: 06/04/22 12:01 PM  Result Value Ref Range   Sodium 142 135 - 145 mmol/L   Potassium 3.3 (L) 3.5 - 5.1 mmol/L   Chloride 104 98 - 111 mmol/L   BUN 9 8 - 23 mg/dL   Creatinine, Ser 1.61 0.44 - 1.00 mg/dL   Glucose, Bld 97 70 - 99 mg/dL    Comment: Glucose reference range applies only to samples taken after fasting for at least 8 hours.   Calcium, Ion 1.15 1.15 - 1.40 mmol/L   TCO2 27 22 - 32 mmol/L   Hemoglobin 10.2 (L) 12.0 - 15.0 g/dL   HCT 09.6 (L) 04.5 - 40.9 %  Aerobic/Anaerobic Culture w Gram Stain (surgical/deep wound)     Status: None (Preliminary result)   Collection Time: 06/04/22  3:38 PM   Specimen: Synovial, Left Knee; Body Fluid  Result Value Ref Range   Specimen Description      WOUND Performed at [facility], [address] [place], [place] [address]    Special Requests      LEFT KNEE Performed at [facility], [address], [place], [place] [address]    Gram Stain      ABUNDANT WBC PRESENT, PREDOMINANTLY PMN NO ORGANISMS SEEN    Culture      CULTURE REINCUBATED FOR BETTER GROWTH Performed at [facility], [address] [place], [place] [address]    Report Status PENDING   Aerobic/Anaerobic Culture w Gram Stain (surgical/deep wound)     Status: None (Preliminary result)   Collection Time: 06/04/22  3:50 PM   Specimen: Synovial, Left Knee; Body Fluid  Result Value Ref Range   Specimen Description      WOUND Performed at [facility], [address], [place], [place] [address]    Special Requests      LEFT KNEE Performed at [facility], [address], [place], [place] [address]    Gram Stain      FEW WBC PRESENT, PREDOMINANTLY PMN NO ORGANISMS SEEN    Culture      CULTURE REINCUBATED FOR BETTER GROWTH Performed at [facility], [address], [place], [place] [address]     Report Status PENDING   Type and screen Order type and screen if day of surgery is less than 15 days from draw of preadmission visit or order  morning of surgery if day of surgery is greater than 6 days from preadmission visit.     Status: None (Preliminary result)   Collection Time: 06/04/22  4:32 PM  Result Value Ref Range   ABO/RH(D) AB POS    Antibody Screen NEG    Sample Expiration 06/07/2022,2359    Unit Number [id number]    Blood Component Type RED CELLS,LR    Unit division 00    Status of Unit ISSUED    Transfusion Status OK TO TRANSFUSE    Crossmatch Result      Compatible Performed at [facility], [address] [place], [place] [address]    Unit Number [id number]    Blood Component Type RBC LR PHER2    Unit division 00    Status of Unit ISSUED    Transfusion Status OK TO TRANSFUSE    Crossmatch Result Compatible   CBC     Status: Abnormal   Collection Time: 06/05/22  3:27 AM  Result Value Ref Range   WBC 5.7 4.0 - 10.5 K/uL   RBC 2.30 (L) 3.87 - 5.11 MIL/uL   Hemoglobin 6.5 (LL) 12.0 - 15.0 g/dL    Comment: REPEATED TO VERIFY THIS CRITICAL RESULT HAS VERIFIED AND BEEN CALLED TO C. ANDERSON RN BY ATCHISON,MARY ON 04 26 2024 AT 0406, AND HAS BEEN READ BACK.     HCT 20.4 (L) 36.0 - 46.0 %   MCV 88.7 80.0 - 100.0 fL   MCH 28.3 26.0 - 34.0 pg   MCHC 31.9 30.0 - 36.0 g/dL   RDW 25.3 66.4 - 40.3 %   Platelets 344 150 - 400 K/uL   nRBC 0.0 0.0 - 0.2 %    Comment: Performed at [facility], [address], [place], [place] [address]  Basic metabolic panel     Status: Abnormal   Collection Time: 06/05/22  3:27 AM  Result Value Ref Range   Sodium 137 135 - 145 mmol/L   Potassium 2.8 (L) 3.5 - 5.1 mmol/L   Chloride 102 98 - 111 mmol/L   CO2 24 22 - 32 mmol/L   Glucose, Bld 141 (H) 70 - 99 mg/dL    Comment: Glucose reference range applies only to samples taken after fasting for at least 8 hours.   BUN 12 8 - 23 mg/dL    Creatinine, Ser 9.56 0.44 - 1.00 mg/dL   Calcium 8.1 (L) 8.9 - 10.3 mg/dL   GFR, Estimated >38 >75 mL/min    Comment: (NOTE) Calculated using the CKD-EPI Creatinine Equation (2021)    Anion gap 11 5 - 15    Comment: Performed at [facility], [address], [place], [place] [address]  Prepare RBC (crossmatch)     Status: None   Collection Time: 06/05/22  6:03 AM  Result Value Ref Range   Order Confirmation      ORDER PROCESSED BY BLOOD BANK Performed at [facility], [address], [place], [place] [address]     No results found.  ROS Positive for knee wound Blood pressure (!) 158/76, pulse 64, temperature 98.7 F (37.1 C), temperature source Oral, resp. rate 20, height 5\' 2"  (1.575 m), weight 76 kg, SpO2 100 %. Physical Exam Left lower extremity with wound vac in place, ace bandage in place, CDI. Distal cap refill intact  Assessment/Plan:  This is a 55 YOF seen today for wound of left leg. The plan is for OR management with orthopedics  tomorrow.  We will review intraoperative images of the wound and discuss care plan next week. We will continue to follow.     Kelle Darting Chelesea Weiand, PA-C 06/05/2022, 3:32 PM

## 2022-06-05 NOTE — TOC Initial Note (Signed)
Transition of Care [facility] [place] [facility]) - Initial/Assessment Note    Patient Details  Name: Tammy [name] MRN: [id number] Date of Birth: [birth year]  Transition of Care [facility]) CM/SW Contact:    Amada Jupiter, LCSW Phone Number: 06/05/2022, 2:23 PM  Clinical Narrative:                  Met with pt to reintroduce TOC/ CSW role (pt know to this CSW from prior stay).  Pt confirms plan to return home when medically ready, however, is aware that further surgery planned as well as a consult from plastic surgery.  Discussed with pt that CSW will follow along for possibility she may need wound VAC or IV abx at home - still TBD.  Pt agreeable.    Expected Discharge Plan: Home w Home Health Services Barriers to Discharge: Continued Medical Work up   Patient Goals and CMS Choice Patient states their goals for this hospitalization and ongoing recovery are:: return home          Expected Discharge Plan and Services In-house Referral: Clinical Social Work   Post Acute Care Choice: Home Health Living arrangements for the past 2 months: Single Family Home                                      Prior Living Arrangements/Services Living arrangements for the past 2 months: Single Family Home Lives with:: Significant Other Patient language and need for interpreter reviewed:: Yes Do you feel safe going back to the place where you live?: Yes      Need for Family Participation in Patient Care: Yes (Comment) Care giver support system in place?: Yes (comment)   Criminal Activity/Legal Involvement Pertinent to Current Situation/Hospitalization: No - Comment as needed  Activities of Daily Living Home Assistive Devices/Equipment: Walker (specify type) ADL Screening (condition at time of admission) Patient's cognitive ability adequate to safely complete daily activities?: Yes Is the patient deaf or have difficulty hearing?: No Does the patient have difficulty seeing, even when wearing  glasses/contacts?: No Does the patient have difficulty concentrating, remembering, or making decisions?: No Patient able to express need for assistance with ADLs?: Yes Does the patient have difficulty dressing or bathing?: No Independently performs ADLs?: Yes (appropriate for developmental age) Does the patient have difficulty walking or climbing stairs?: Yes Weakness of Legs: Left Weakness of Arms/Hands: None  Permission Sought/Granted Permission sought to share information with : Family Supports Permission granted to share information with : Yes, Verbal Permission Granted  Share Information with NAME: Lynden Oxford, s/o @ [phone]           Emotional Assessment Appearance:: Appears stated age Attitude/Demeanor/Rapport: Engaged, Gracious Affect (typically observed): Accepting Orientation: : Oriented to Self, Oriented to Place, Oriented to  Time, Oriented to Situation Alcohol / Substance Use: Not Applicable Psych Involvement: No (comment)  Admission diagnosis:  Surgical wound dehiscence [T81.31XA] Patient Active Problem List   Diagnosis Date Noted   Surgical wound dehiscence 06/04/2022   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, left, with nonunion, subsequent encounter 04/30/2022   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 04/30/2022   Hypokalemia 04/16/2021   Hip fracture requiring operative repair (HCC) 04/16/2021   Vitamin D deficiency 04/12/2021   Hyponatremia 04/11/2021   Acute postoperative anemia due to expected blood loss 04/11/2021   Class 1 obesity 04/11/2021   Left comminuted and displaced  fracture of the distal femur metaphysis 04/08/2021   Anemia 04/08/2021   History of CVA (cerebrovascular accident) 04/08/2021   Genetic testing 07/17/2020   Family history of breast cancer 07/10/2020   Family history of ovarian cancer 07/10/2020   Family history of colon cancer 07/10/2020   Malignant neoplasm of  upper-outer quadrant of left breast in female, estrogen receptor positive (HCC) 07/09/2020   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   Chronic pain syndrome 10/06/2012   Nicotine dependence 10/06/2012   Anxiety state, unspecified 10/05/2012   Bipolar disorder, unspecified (HCC) 10/05/2012   Slurred speech 10/04/2012   Left-sided weakness 10/04/2012   Hyperkalemia 10/04/2012   Acute CVA (cerebrovascular accident) (HCC) 10/04/2012   HTN (hypertension)    PCP:  Quitman Livings, MD Pharmacy:   [id number] - [place], [place] - [address] AT [facility] [address] [place] [address] Phone: [phone] Fax: [phone]  [id number] [place], [place] - [address] AT [facility] OF [address] [place] [address] Phone: [phone] Fax: [phone]  [facility]) - [place], [place] - [address] [facility] [address] [place] [address] [place] [address] Phone: [phone] Fax: [phone]  [facility] [address] [place] [address] Phone: [phone] Fax: [phone]  [facility] - [place], [place] - [address] [place] [address] Phone: [phone] Fax: [phone]     Social Determinants of Health (SDOH) Social History: SDOH Screenings   Food Insecurity: No Food Insecurity (06/04/2022)  Housing: Low Risk  (06/04/2022)  Transportation Needs: No Transportation Needs (06/04/2022)  Utilities: Not At Risk (06/04/2022)  Tobacco Use: Medium Risk (06/05/2022)   SDOH Interventions:     Readmission Risk Interventions    06/05/2022    2:21 PM 05/01/2022    1:55 PM  Readmission Risk Prevention Plan  Post Dischage Appt  Complete  Medication Screening  Complete  Transportation Screening Complete Complete  PCP or Specialist Appt within 5-7 Days  Complete   Home Care Screening Complete   Medication Review (RN CM) Complete

## 2022-06-06 DIAGNOSIS — F3170 Bipolar disorder, currently in remission, most recent episode unspecified: Secondary | ICD-10-CM | POA: Diagnosis not present

## 2022-06-06 DIAGNOSIS — K7460 Unspecified cirrhosis of liver: Secondary | ICD-10-CM | POA: Diagnosis present

## 2022-06-06 DIAGNOSIS — B182 Chronic viral hepatitis C: Secondary | ICD-10-CM | POA: Diagnosis present

## 2022-06-06 LAB — CBC
HCT: 26.4 % — ABNORMAL LOW (ref 36.0–46.0)
Hemoglobin: 8.7 g/dL — ABNORMAL LOW (ref 12.0–15.0)
MCH: 28.9 pg (ref 26.0–34.0)
MCHC: 33 g/dL (ref 30.0–36.0)
MCV: 87.7 fL (ref 80.0–100.0)
Platelets: 308 10*3/uL (ref 150–400)
RBC: 3.01 MIL/uL — ABNORMAL LOW (ref 3.87–5.11)
RDW: 15.3 % (ref 11.5–15.5)
WBC: 7.7 10*3/uL (ref 4.0–10.5)
nRBC: 0 % (ref 0.0–0.2)

## 2022-06-06 LAB — BPAM RBC
Blood Product Expiration Date: 202405022359
Unit Type and Rh: 600
Unit Type and Rh: 6200
Unit Type and Rh: 6200

## 2022-06-06 LAB — COMPREHENSIVE METABOLIC PANEL
ALT: 9 U/L (ref 0–44)
AST: 13 U/L — ABNORMAL LOW (ref 15–41)
Albumin: 2.5 g/dL — ABNORMAL LOW (ref 3.5–5.0)
Alkaline Phosphatase: 54 U/L (ref 38–126)
Anion gap: 11 (ref 5–15)
BUN: 12 mg/dL (ref 8–23)
CO2: 23 mmol/L (ref 22–32)
Calcium: 8.3 mg/dL — ABNORMAL LOW (ref 8.9–10.3)
Chloride: 103 mmol/L (ref 98–111)
Creatinine, Ser: 0.79 mg/dL (ref 0.44–1.00)
GFR, Estimated: >60 mL/min (ref >60)
Glucose, Bld: 88 mg/dL (ref 70–99)
Potassium: 2.9 mmol/L — ABNORMAL LOW (ref 3.5–5.1)
Sodium: 137 mmol/L (ref 135–145)
Total Bilirubin: 0.6 mg/dL (ref 0.3–1.2)
Total Protein: 6.3 g/dL — ABNORMAL LOW (ref 6.5–8.1)

## 2022-06-06 LAB — AEROBIC/ANAEROBIC CULTURE W GRAM STAIN (SURGICAL/DEEP WOUND)

## 2022-06-06 LAB — BASIC METABOLIC PANEL
Anion gap: 11 (ref 5–15)
BUN: 13 mg/dL (ref 8–23)
CO2: 22 mmol/L (ref 22–32)
Calcium: 8.2 mg/dL — ABNORMAL LOW (ref 8.9–10.3)
Chloride: 106 mmol/L (ref 98–111)
Creatinine, Ser: 0.78 mg/dL (ref 0.44–1.00)
GFR, Estimated: >60 mL/min (ref >60)
Glucose, Bld: 85 mg/dL (ref 70–99)
Potassium: 2.9 mmol/L — ABNORMAL LOW (ref 3.5–5.1)
Sodium: 139 mmol/L (ref 135–145)

## 2022-06-06 LAB — TYPE AND SCREEN: Unit division: 0

## 2022-06-06 LAB — PREPARE RBC (CROSSMATCH)

## 2022-06-06 LAB — POTASSIUM: Potassium: 3.3 mmol/L — ABNORMAL LOW (ref 3.5–5.1)

## 2022-06-06 LAB — HEMOGLOBIN AND HEMATOCRIT, BLOOD
HCT: 33.8 % — ABNORMAL LOW (ref 36.0–46.0)
Hemoglobin: 11.2 g/dL — ABNORMAL LOW (ref 12.0–15.0)

## 2022-06-06 LAB — CREATININE, SERUM
Creatinine, Ser: 0.92 mg/dL (ref 0.44–1.00)
GFR, Estimated: >60 mL/min (ref >60)

## 2022-06-06 LAB — MAGNESIUM: Magnesium: 1.8 mg/dL (ref 1.7–2.4)

## 2022-06-06 MED ORDER — SODIUM CHLORIDE 0.9% IV SOLUTION: Freq: Once | INTRAVENOUS | Status: AC

## 2022-06-06 MED ORDER — POTASSIUM CHLORIDE CRYS ER 20 MEQ PO TBCR
60.0000 meq | EXTENDED_RELEASE_TABLET | Freq: Once | ORAL | Status: AC
  Administered 2022-06-06: 60 meq via ORAL
  Filled 2022-06-06: qty 3

## 2022-06-06 MED ORDER — HYDRALAZINE HCL 20 MG/ML IJ SOLN: 5.0000 mg | Freq: Four times a day (QID) | INTRAMUSCULAR | Status: DC | PRN

## 2022-06-06 NOTE — Anesthesia Preprocedure Evaluation (Signed)
Anesthesia Evaluation  Patient identified by MRN, date of birth, ID band Patient awake    Reviewed: Allergy & Precautions, H&P , NPO status , Patient's Chart, lab work & pertinent test results  Airway Mallampati: II  TM Distance: >3 FB Neck ROM: Full    Dental no notable dental hx. (+) Poor Dentition, Chipped, Missing, Dental Advisory Given, Partial Upper, Partial Lower   Pulmonary former smoker   Pulmonary exam normal breath sounds clear to auscultation       Cardiovascular hypertension, Pt. on medications Normal cardiovascular exam Rhythm:Regular Rate:Normal     Neuro/Psych  PSYCHIATRIC DISORDERS Anxiety Depression Bipolar Disorder    Neuromuscular disease CVA, No Residual Symptoms    GI/Hepatic ,GERD  ,,(+) Cirrhosis       , Hepatitis -, C  Endo/Other  negative endocrine ROS    Renal/GU negative Renal ROS  negative genitourinary   Musculoskeletal  (+) Arthritis , Osteoarthritis,    Abdominal   Peds negative pediatric ROS (+)  Hematology  (+) Blood dyscrasia, anemia   Anesthesia Other Findings   Reproductive/Obstetrics negative OB ROS                             Anesthesia Physical Anesthesia Plan  ASA: 3  Anesthesia Plan: General   Post-op Pain Management: Toradol IV (intra-op)*, Ofirmev IV (intra-op)* and Dilaudid IV   Induction: Intravenous  PONV Risk Score and Plan: 3 and Ondansetron, Dexamethasone and Treatment may vary due to age or medical condition  Airway Management Planned: LMA  Additional Equipment: None  Intra-op Plan:   Post-operative Plan: Extubation in OR  Informed Consent: I have reviewed the patients History and Physical, chart, labs and discussed the procedure including the risks, benefits and alternatives for the proposed anesthesia with the patient or authorized representative who has indicated his/her understanding and acceptance.     Dental  advisory given  Plan Discussed with: CRNA and Anesthesiologist  Anesthesia Plan Comments:        Anesthesia Quick Evaluation

## 2022-06-06 NOTE — Progress Notes (Addendum)
Subjective:  Patient reports pain as mild to moderate.  Denies N/V/CP/SOB/Abd pain. She reports her pain is well controlled with the hydrocodone. Denies any tinging or numbness in LE bilaterally.   Dr. [name] discussed surgery options with patient today. We are postponing surgery until tomorrow due to low hemoglobin and low potassium and need for further medical optimization.   Objective:   VITALS:   Vitals:   06/06/22 0952 06/06/22 1007 06/06/22 1215 06/06/22 1433  BP: (!) 160/73 (!) 177/82 (!) 165/75 139/72  Pulse: (!) 50 (!) 52 (!) 55 (!) 58  Resp: 18 18 16 17   Temp: 98.3 F (36.8 C) 98.6 F (37 C) 97.6 F (36.4 C) 98.4 F (36.9 C)  TempSrc: Oral  Oral Oral  SpO2: 100%  95% 98%  Weight:      Height:        Patient sitting up in bed. NAD.  Currently receiving 2 units PRBCs.  Neurologically intact ABD soft Neurovascular intact Sensation intact distally Intact pulses distally Dorsiflexion/Plantar flexion intact No cellulitis present Compartment soft Negative pressure dressing C/D/I. No leaks detected. Wound vac . 200 cc SS output.  Knee immobilizer on.   Lab Results  Component Value Date   WBC 7.7 06/06/2022   HGB 8.7 (L) 06/06/2022   HCT 26.4 (L) 06/06/2022   MCV 87.7 06/06/2022   PLT 308 06/06/2022   BMET    Component Value Date/Time   NA 137 06/06/2022 1016   K 2.9 (L) 06/06/2022 1016   CL 103 06/06/2022 1016   CO2 23 06/06/2022 1016   GLUCOSE 88 06/06/2022 1016   BUN 12 06/06/2022 1016   CREATININE 0.79 06/06/2022 1016   CREATININE 0.76 07/10/2020 1220   CALCIUM 8.3 (L) 06/06/2022 1016   GFRNONAA >60 06/06/2022 1016   GFRNONAA >60 07/10/2020 1220   Results for orders placed or performed during the hospital encounter of 06/04/22  Aerobic/Anaerobic Culture w Gram Stain (surgical/deep wound)     Status: None (Preliminary result)   Collection Time: 06/04/22  3:38 PM   Specimen: Synovial, Left Knee; Body Fluid  Result Value Ref Range  Status   Specimen Description   Final    WOUND Performed at [facility], [address] [place], [place] [address]    Special Requests   Final    LEFT KNEE Performed at [facility], [address] [place], [place] [address]    Gram Stain   Final    ABUNDANT WBC PRESENT, PREDOMINANTLY PMN NO ORGANISMS SEEN Performed at [facility], [address], [place], [place] [address]    Culture   Final    CULTURE REINCUBATED FOR BETTER GROWTH NO ANAEROBES ISOLATED; CULTURE IN PROGRESS FOR 5 DAYS    Report Status PENDING  Incomplete  Aerobic/Anaerobic Culture w Gram Stain (surgical/deep wound)     Status: None (Preliminary result)   Collection Time: 06/04/22  3:50 PM   Specimen: Synovial, Left Knee; Body Fluid  Result Value Ref Range Status   Specimen Description   Final    WOUND Performed at [facility], [address] [place], [place] [address]    Special Requests   Final    LEFT KNEE Performed at [facility], [address] [place], [place] [address]    Gram Stain   Final    FEW WBC PRESENT, PREDOMINANTLY PMN NO ORGANISMS SEEN Performed at [facility], [address] [place], [place]  [address]    Culture   Final    CULTURE REINCUBATED FOR BETTER GROWTH NO ANAEROBES ISOLATED; CULTURE IN PROGRESS FOR 5 DAYS    Report Status PENDING  Incomplete     Assessment/Plan: 2 Days Post-Op   Principal Problem:   Surgical wound dehiscence Active Problems:   HTN (hypertension)   Bipolar disorder, unspecified (HCC)   History of CVA (cerebrovascular accident)   Hypokalemia   Liver cirrhosis (HCC)  ABLA. Hemoglobin 8.7 this morning. Hemoglobin still not hight enough for surgery. 2 additional units PRBCs ordered. Patient asymptomatic. Continue to monitor. Recheck H&H post transfusion.    Hypokalemia. Potassium 2.9 this morning despite 50 mEq yesterday.   WBAT with walker and Knee  Immobilizer do not bend the knee.  PO pain control PT/OT Dispo: - [facility] consulted for medical management of patient. Appreciate their assistance.  - Intraoperative cultures pending. Continue to monitor. Continue broad spectrum IV antibiotics, will tailor based on culture results.  - Continue wound vac and monitor output.  - Surgery postponed to tomorrow due to low hemoglobin and potassium. Spoke with patient about surgical plans. She will need repeat I&D with poly liner exchange. Plan for tomorrow morning. Continue to hold chemical DVT ppx. Patient to be NPO after midnight tonight.    Clois Dupes, PA-C 06/06/2022, 2:47 PM   [facility]  [place] [address], [place], [place] [address] Phone: [phone] [email or web address] Facebook  [employer]

## 2022-06-06 NOTE — Progress Notes (Signed)
Pharmacy Antibiotic Note  Tammy [name] is a 68 y.o. female admitted on 06/04/2022 with left knee wound dehiscence. Underwent I&D, placement of wound VAC in OR today. Pharmacy has been consulted for Vancomycin and Zosyn dosing post-operatively.  Day #3 of abx for L knee wound infection s/p I&D. Plan to take back to OR on 4/28 for further I&D. SCr stable. Cx's have not grown any bacteria yet. Afebrile, WBC wnl.  Plan: Continue Zosyn 3.375 gm IV q8h (4 hour infusion) Continue vancomycin 1,250mg  IV Q24h Monitor clinical picture, renal function, vanc levels prn F/U C&S, abx deescalation / LOT  Recommendations: Consider changing zosyn to ceftriaxone BP has been elevated, would recommend restarting PTA amlodipine.  Height: 5\' 2"  (157.5 cm) Weight: 76 kg (167 lb 8.8 oz) IBW/kg (Calculated) : 50.1  Temp (24hrs), Avg:98.4 F (36.9 C), Min:97.6 F (36.4 C), Max:99.3 F (37.4 C)  Recent Labs  Lab 06/04/22 1201 06/05/22 0327 06/06/22 0324 06/06/22 0849 06/06/22 1016  WBC  --  5.7 7.7  --   --   CREATININE 0.90 0.99 0.92 0.78 0.79     Estimated Creatinine Clearance: 65.2 mL/min (by C-G formula based on SCr of 0.79 mg/dL).    Allergies  Allergen Reactions   Lisinopril Cough    Antimicrobials this admission: 4/25 Cefazolin x 1 4/25 Vancomycin >> 4/25 Zosyn >>  Dose adjustments this admission: --  Microbiology results: 4/25 L knee synovial fluid: ngtd  Thank you for allowing pharmacy to be a part of this patient's care.  Enzo Bi, PharmD, BCPS, BCIDP Clinical Pharmacist 06/06/2022 12:30 PM

## 2022-06-06 NOTE — Consult Note (Signed)
[facility] Initial Consultation Note  Tammy [name] ZOX:[id number] DOB: [birth year] DOA: 06/04/2022  PCP: Quitman Livings, MD   Requesting Physician: Tammy Frederic, MD   Reason for Consultation: Medical Management  HPI: Tammy [name] is a 67 y.o. female with medical history significant for prior CVA, liver cirrhosis from hepatitis C treated, history of breast cancer on Arimidex and chronic hypokalemia admitted to the hospital by Dr. Samson [name] for multistage operative repair of her left distal femur wound.  She unfortunately fell and broke her distal femur last year, was taken to the OR 04/10/21 by Tammy [name] for ORIF left distal femur with intercondylar extension.  She then underwent hardware removal and distal femur replacement on 04/30/2022.  Now, she has been readmitted by the orthopedic service on 4/25 due to wound dehiscence of the surgical site.  She was taken for debridement and washout as well as placement of wound VAC by Tammy [name] on 4/25 which she tolerated well.  Since that time, she was found to be slightly anemic, and has had recurrent hypokalemia.  Tammy [name] plans larger reconstruction surgery with the assistance of plastic surgery during this hospital stay, requests hospitalist assistance for electrolyte management and to ensure patient readiness for surgery.  Review of Systems: Please see HPI for pertinent positives and negatives. A complete 10 system review of systems are otherwise negative.  Past Medical History:  Diagnosis Date   Anemia    hx of   Anxiety    on meds   Arthritis    back/bilateral ankles   Bipolar disorder (HCC)    Breast cancer (HCC) 2022   LEFT breast-radiation   Depression    on meds   Family history of breast cancer 07/10/2020   Family history of colon cancer 07/10/2020   Family history of ovarian cancer 07/10/2020   GERD (gastroesophageal reflux disease)    on meds   Heart murmur    dx by PCP   Hepatitis C     HTN (hypertension)    on meds   Hx of radiation therapy    Liver cirrhosis (HCC)    from hep c   Neuromuscular disorder (HCC)    Osteopenia    Stroke (HCC) 09/2012   slurred speech and LEFT sided weakness/notes 10/04/2012   Past Surgical History:  Procedure Laterality Date   ANKLE FRACTURE SURGERY Left 2018   BREAST LUMPECTOMY WITH RADIOACTIVE SEED AND SENTINEL LYMPH NODE BIOPSY Left 07/26/2020   Procedure: LEFT BREAST LUMPECTOMY WITH RADIOACTIVE SEED AND SENTINEL LYMPH NODE BIOPSY;  Surgeon: Tammy Miyamoto, MD;  Location: [facility];  Service: General;  Laterality: Left;   CESAREAN SECTION  1986   COLONOSCOPY     DIAGNOSTIC LAPAROSCOPY  1985   HARDWARE REMOVAL Left 04/30/2022   Procedure: HARDWARE REMOVAL LEFT FEMUR;  Surgeon: Tammy Frederic, MD;  Location: [facility];  Service: Orthopedics;  Laterality: Left;  150   INCISION AND DRAINAGE OF WOUND Left 06/04/2022   Procedure: IRRIGATION AND DEBRIDEMENT KNEE;  Surgeon: Tammy Frederic, MD;  Location: [facility];  Service: Orthopedics;  Laterality: Left;  60   ORIF FEMUR FRACTURE Left 04/10/2021   Procedure: OPEN REDUCTION INTERNAL FIXATION (ORIF) DISTAL FEMUR FRACTURE;  Surgeon: Tammy Galas, MD;  Location: [facility];  Service: Orthopedics;  Laterality: Left;   PARTIAL KNEE ARTHROPLASTY Left 04/30/2022   Procedure: DISTAL FEMUR REPLACEMENT;  Surgeon: Tammy Frederic, MD;  Location: [facility];  Service: Orthopedics;  Laterality: Left;  150  Social History:  reports that she has quit smoking. Her smoking use included cigarettes. She has a 60.00 pack-year smoking history. She has never used smokeless tobacco. She reports that she does not currently use alcohol. She reports that she does not use drugs.  Allergies  Allergen Reactions   Lisinopril Cough    Family History  Problem Relation Age of Onset   Other Mother        blood clots   Diabetes Sister    Neuropathy Sister        diabetic neuropathy   Diabetes Brother     Neuropathy Brother        diabetic neuropathy   Breast cancer Maternal Aunt        23   Colon cancer Maternal Aunt 2   Diabetes Paternal Aunt    Breast cancer Other        PGM's sister; dx mid 29s   Colon cancer Other        PGM's brother; dx 29s   Ovarian cancer Other        PGM's mother; dx unknown age   Esophageal cancer Neg Hx    Rectal cancer Neg Hx    Stomach cancer Neg Hx    Colon polyps Neg Hx      Prior to Admission medications   Medication Sig Start Date End Date Taking? Authorizing Provider  acetaminophen (TYLENOL) 500 MG tablet Take 1,000 mg by mouth every 6 (six) hours as needed for moderate pain.   Yes [provider]  amLODipine (NORVASC) 10 MG tablet Take 1 tablet (10 mg total) by mouth daily. 04/22/21  Yes Setzer, Lynnell Jude, PA-C  anastrozole (ARIMIDEX) 1 MG tablet TAKE 1 TABLET BY MOUTH DAILY 03/16/22  Yes Causey, Larna Daughters, NP  Ascorbic Acid (VITAMIN C) 1000 MG tablet Take 1,000 mg by mouth daily.   Yes [provider]  aspirin (ASPIRIN CHILDRENS) 81 MG chewable tablet Chew 1 tablet (81 mg total) by mouth 2 (two) times daily with a meal. 05/05/22 06/19/22 Yes Hill, Alain Honey, PA-C  buPROPion (WELLBUTRIN SR) 150 MG 12 hr tablet Take 150 mg by mouth 2 (two) times daily. 10/04/21  Yes [provider]  CALCIUM PO Take 500 mg by mouth daily.   Yes [provider]  cefadroxil (DURICEF) 500 MG capsule Take 500 mg by mouth 2 (two) times daily. 06/01/22  Yes [provider]  cyanocobalamin (VITAMIN B12) 1000 MCG tablet Take 1,000 mcg by mouth daily.   Yes [provider]  gabapentin (NEURONTIN) 100 MG capsule Take 1 capsule (100 mg total) by mouth 2 (two) times daily. Patient taking differently: Take 100-300 mg by mouth See admin instructions. Take 100 mg in the morning and 300 mg at bedtime 04/22/21  Yes Setzer, Lynnell Jude, PA-C  hydrALAZINE (APRESOLINE) 25 MG tablet Take 1 tablet (25 mg total) by mouth 2 (two) times daily.  04/22/21  Yes Setzer, Lynnell Jude, PA-C  meloxicam (MOBIC) 15 MG tablet Take 1 tablet (15 mg total) by mouth daily. 05/05/22 05/05/23 Yes Hill, Alain Honey, PA-C  Menthol, Topical Analgesic, (BENGAY EX) Apply 1 Application topically daily as needed (pain).   Yes [provider]  Multiple Vitamin (MULTIVITAMIN) TABS Take 1 tablet by mouth daily. 04/22/21  Yes Setzer, Lynnell Jude, PA-C  pantoprazole (PROTONIX) 40 MG tablet Take 1 tablet (40 mg total) by mouth daily. 04/22/21  Yes Setzer, Lynnell Jude, PA-C  potassium chloride (KLOR-CON) 10 MEQ tablet Take 10 mEq by  mouth daily. 04/07/22  Yes [provider]  REGLAN 10 MG tablet Take 10 mg by mouth daily as needed for nausea. 05/18/22  Yes [provider]  senna (SENOKOT) 8.6 MG TABS tablet Take 1 tablet by mouth.   Yes [provider]  zinc gluconate 50 MG tablet Take 50 mg by mouth daily.   Yes [provider]  HYDROcodone-acetaminophen (NORCO/VICODIN) 5-325 MG tablet Take 1 tablet by mouth every 4 (four) hours as needed for moderate pain or severe pain. 05/28/22   [provider]  ondansetron (ZOFRAN) 4 MG tablet Take 1 tablet (4 mg total) by mouth every 8 (eight) hours as needed for nausea or vomiting. Patient not taking: Reported on 06/03/2022 05/05/22 05/05/23  Clois Dupes, PA-C   Physical Exam: BP (!) 160/71 (BP Location: Right Arm)   Pulse (!) 51   Temp 97.6 F (36.4 C)   Resp 19   Ht 5\' 2"  (1.575 m)   Wt 76 kg   SpO2 99%   BMI 30.65 kg/m   General:  Alert, oriented, calm, in no acute distress, sitting up in bed looks very comfortable  Eyes: EOMI, clear conjuctivae, white sclerea Neck: supple, no masses, trachea mildline  Cardiovascular: RRR, no murmurs or rubs, no peripheral edema  Respiratory: clear to auscultation bilaterally, no wheezes, no crackles  Abdomen: soft, nontender, nondistended, normal bowel tones heard  Skin: dry, no rashes  Musculoskeletal: left leg in knee immobilizer, wound vac in  place with about 200 cc SS output  Psychiatric: appropriate affect, normal speech  Neurologic: extraocular muscles intact, clear speech, moving all extremities with intact sensorium          Recent Labs and Imaging Reviewed:  Basic Metabolic Panel: Recent Labs  Lab 06/04/22 1201 06/05/22 0327 06/06/22 0324  NA 142 137  --   K 3.3* 2.8*  --   CL 104 102  --   CO2  --  24  --   GLUCOSE 97 141*  --   BUN 9 12  --   CREATININE 0.90 0.99 0.92  CALCIUM  --  8.1*  --    Liver Function Tests: No results for input(s): "AST", "ALT", "ALKPHOS", "BILITOT", "PROT", "ALBUMIN" in the last 168 hours. No results for input(s): "LIPASE", "AMYLASE" in the last 168 hours. No results for input(s): "AMMONIA" in the last 168 hours. CBC: Recent Labs  Lab 06/04/22 1201 06/05/22 0327 06/05/22 1844 06/06/22 0324  WBC  --  5.7  --  7.7  HGB 10.2* 6.5* 8.6* 8.7*  HCT 30.0* 20.4* 26.6* 26.4*  MCV  --  88.7  --  87.7  PLT  --  344  --  308   Cardiac Enzymes: No results for input(s): "CKTOTAL", "CKMB", "CKMBINDEX", "TROPONINI" in the last 168 hours.  BNP (last 3 results) No results for input(s): "BNP" in the last 8760 hours.  ProBNP (last 3 results) No results for input(s): "PROBNP" in the last 8760 hours.  CBG: No results for input(s): "GLUCAP" in the last 168 hours.  Radiological Exams on Admission: No results found.  Summary and Recommendations: This is a pleasant 67 year old female history of hypertension, bipolar disorder, history of CVA, liver cirrhosis and recurrent hypokalemia who was admitted by the orthopedic service for left distal femur wound repair.  Orthopedics and plastics plan for repeat irrigation and debridement with polyliner exchange.  Hypokalemia-this is a chronic problem for the patient, for the last 3 years or so she has been intermittently on  potassium as an outpatient.  Magnesium checked and is normal.  No diarrhea, vomiting, or high drain output to explain electrolyte  losses. -Await repeat BMP from this morning -Replete potassium as indicated, and recheck BMP this afternoon as well as with morning labs  Anemia-unclear etiology, no significant bleeding identified, patient denies melena or hematochezia.  Agree with transfusion, follow with morning labs.  Thank you for involving [place] in the care of your patient. [facility] will continue to follow along with you.  Time spent: 49 minutes  Cyndie Woodbeck Sharlette Dense MD [facility] Pager [phone]  If 7PM-7AM, please contact night-coverage [email or web address] Password [facility]  06/06/2022, 8:15 AM

## 2022-06-06 NOTE — Plan of Care (Signed)
  Problem: Education: Goal: Knowledge of General Education information will improve Description: Including pain rating scale, medication(s)/side effects and non-pharmacologic comfort measures Outcome: Progressing   Problem: Activity: Goal: Risk for activity intolerance will decrease Outcome: Progressing   Problem: Pain Managment: Goal: General experience of comfort will improve Outcome: Progressing   

## 2022-06-06 NOTE — Plan of Care (Signed)
  Problem: Education: Goal: Knowledge of General Education information will improve Description: Including pain rating scale, medication(s)/side effects and non-pharmacologic comfort measures Outcome: Progressing   Problem: Nutrition: Goal: Adequate nutrition will be maintained Outcome: Adequate for Discharge   Problem: Pain Managment: Goal: General experience of comfort will improve Outcome: Progressing   

## 2022-06-07 ENCOUNTER — Encounter (HOSPITAL_COMMUNITY)
Admission: AD | Disposition: A | Discharge status: Home Health | Payer: Self-pay | Source: Home / Self Care | Attending: Orthopedic Surgery

## 2022-06-07 ENCOUNTER — Other Ambulatory Visit: Payer: Self-pay

## 2022-06-07 ENCOUNTER — Inpatient Hospital Stay (HOSPITAL_COMMUNITY): Payer: Medicare Other | Admitting: Certified Registered Nurse Anesthetist

## 2022-06-07 DIAGNOSIS — I10 Essential (primary) hypertension: Secondary | ICD-10-CM | POA: Diagnosis not present

## 2022-06-07 DIAGNOSIS — T8189XA Other complications of procedures, not elsewhere classified, initial encounter: Secondary | ICD-10-CM

## 2022-06-07 DIAGNOSIS — S81002A Unspecified open wound, left knee, initial encounter: Secondary | ICD-10-CM

## 2022-06-07 DIAGNOSIS — Z87891 Personal history of nicotine dependence: Secondary | ICD-10-CM

## 2022-06-07 DIAGNOSIS — E876 Hypokalemia: Secondary | ICD-10-CM

## 2022-06-07 HISTORY — PX: I & D KNEE WITH POLY EXCHANGE: SHX5024

## 2022-06-07 LAB — TYPE AND SCREEN
Unit division: 0
Unit division: 0
Unit division: 0

## 2022-06-07 LAB — BPAM RBC
Blood Product Expiration Date: 202405022359
ISSUE DATE / TIME: 202404261132
ISSUE DATE / TIME: 202404270947
ISSUE DATE / TIME: 202404271443
Unit Type and Rh: 600

## 2022-06-07 LAB — CBC
HCT: 34.9 % — ABNORMAL LOW (ref 36.0–46.0)
Hemoglobin: 11.5 g/dL — ABNORMAL LOW (ref 12.0–15.0)
MCH: 28.9 pg (ref 26.0–34.0)
MCHC: 33 g/dL (ref 30.0–36.0)
MCV: 87.7 fL (ref 80.0–100.0)
Platelets: 317 10*3/uL (ref 150–400)
RBC: 3.98 MIL/uL (ref 3.87–5.11)
RDW: 15.4 % (ref 11.5–15.5)
WBC: 5.5 10*3/uL (ref 4.0–10.5)
nRBC: 0 % (ref 0.0–0.2)

## 2022-06-07 LAB — AEROBIC/ANAEROBIC CULTURE W GRAM STAIN (SURGICAL/DEEP WOUND)

## 2022-06-07 LAB — BASIC METABOLIC PANEL
Anion gap: 10 (ref 5–15)
BUN: 14 mg/dL (ref 8–23)
CO2: 23 mmol/L (ref 22–32)
Calcium: 8.6 mg/dL — ABNORMAL LOW (ref 8.9–10.3)
Chloride: 105 mmol/L (ref 98–111)
Creatinine, Ser: 0.85 mg/dL (ref 0.44–1.00)
GFR, Estimated: >60 mL/min (ref >60)
Glucose, Bld: 94 mg/dL (ref 70–99)
Potassium: 3.7 mmol/L (ref 3.5–5.1)
Sodium: 138 mmol/L (ref 135–145)

## 2022-06-07 SURGERY — IRRIGATION AND DEBRIDEMENT KNEE WITH POLY EXCHANGE
Anesthesia: General | Site: Knee | Laterality: Left

## 2022-06-07 MED ORDER — OXYCODONE HCL 5 MG/5ML PO SOLN: 5.0000 mg | Freq: Once | ORAL | Status: AC | PRN

## 2022-06-07 MED ORDER — ONDANSETRON HCL 4 MG/2ML IJ SOLN
INTRAMUSCULAR | Status: AC
  Filled 2022-06-07: qty 2

## 2022-06-07 MED ORDER — KETOROLAC TROMETHAMINE 30 MG/ML IJ SOLN
INTRAMUSCULAR | Status: AC
  Filled 2022-06-07: qty 1

## 2022-06-07 MED ORDER — POVIDONE-IODINE 10 % EX SWAB: 2.0000 | Freq: Once | CUTANEOUS | Status: DC

## 2022-06-07 MED ORDER — ACETAMINOPHEN 10 MG/ML IV SOLN
INTRAVENOUS | Status: DC | PRN
  Administered 2022-06-07: 1000 mg via INTRAVENOUS

## 2022-06-07 MED ORDER — MEPERIDINE HCL 50 MG/ML IJ SOLN: 6.2500 mg | INTRAMUSCULAR | Status: DC | PRN

## 2022-06-07 MED ORDER — PROPOFOL 10 MG/ML IV BOLUS
INTRAVENOUS | Status: AC
  Filled 2022-06-07: qty 20

## 2022-06-07 MED ORDER — FENTANYL CITRATE (PF) 250 MCG/5ML IJ SOLN
INTRAMUSCULAR | Status: AC
  Filled 2022-06-07: qty 5

## 2022-06-07 MED ORDER — ALUM & MAG HYDROXIDE-SIMETH 200-200-20 MG/5ML PO SUSP: 30.0000 mL | ORAL | Status: DC | PRN

## 2022-06-07 MED ORDER — MORPHINE SULFATE (PF) 2 MG/ML IV SOLN
0.5000 mg | INTRAVENOUS | Status: DC | PRN
  Administered 2022-06-08 – 2022-06-09 (×2): 1 mg via INTRAVENOUS
  Filled 2022-06-07 (×2): qty 1

## 2022-06-07 MED ORDER — MENTHOL 3 MG MT LOZG
1.0000 | LOZENGE | OROMUCOSAL | Status: DC | PRN
  Administered 2022-06-16: 3 mg via ORAL
  Filled 2022-06-07: qty 9

## 2022-06-07 MED ORDER — SODIUM CHLORIDE 0.9 % IV SOLN: INTRAVENOUS | Status: DC

## 2022-06-07 MED ORDER — ONDANSETRON HCL 4 MG/2ML IJ SOLN: 4.0000 mg | Freq: Four times a day (QID) | INTRAMUSCULAR | Status: DC | PRN

## 2022-06-07 MED ORDER — FENTANYL CITRATE PF 50 MCG/ML IJ SOSY
25.0000 ug | PREFILLED_SYRINGE | INTRAMUSCULAR | Status: DC | PRN
  Administered 2022-06-07 (×2): 50 ug via INTRAVENOUS

## 2022-06-07 MED ORDER — FENTANYL CITRATE (PF) 250 MCG/5ML IJ SOLN
INTRAMUSCULAR | Status: DC | PRN
  Administered 2022-06-07 (×10): 50 ug via INTRAVENOUS

## 2022-06-07 MED ORDER — LACTATED RINGERS IV SOLN: INTRAVENOUS | Status: DC

## 2022-06-07 MED ORDER — ISOPROPYL ALCOHOL 70 % SOLN
Status: AC
  Filled 2022-06-07: qty 480

## 2022-06-07 MED ORDER — METHOCARBAMOL 1000 MG/10ML IJ SOLN: 500.0000 mg | Freq: Four times a day (QID) | INTRAVENOUS | Status: DC | PRN

## 2022-06-07 MED ORDER — ONDANSETRON HCL 4 MG/2ML IJ SOLN: 4.0000 mg | Freq: Once | INTRAMUSCULAR | Status: DC | PRN

## 2022-06-07 MED ORDER — ONDANSETRON HCL 4 MG/2ML IJ SOLN
INTRAMUSCULAR | Status: DC | PRN
  Administered 2022-06-07: 4 mg via INTRAVENOUS

## 2022-06-07 MED ORDER — PHENYLEPHRINE HCL (PRESSORS) 10 MG/ML IV SOLN
INTRAVENOUS | Status: AC
  Filled 2022-06-07: qty 1

## 2022-06-07 MED ORDER — OXYCODONE HCL 5 MG PO TABS
5.0000 mg | ORAL_TABLET | Freq: Once | ORAL | Status: AC | PRN
  Administered 2022-06-07: 5 mg via ORAL

## 2022-06-07 MED ORDER — FENTANYL CITRATE PF 50 MCG/ML IJ SOSY
PREFILLED_SYRINGE | INTRAMUSCULAR | Status: AC
  Filled 2022-06-07: qty 3

## 2022-06-07 MED ORDER — METHOCARBAMOL 500 MG PO TABS
500.0000 mg | ORAL_TABLET | Freq: Four times a day (QID) | ORAL | Status: DC | PRN
  Administered 2022-06-07 – 2022-06-18 (×26): 500 mg via ORAL
  Filled 2022-06-07 (×28): qty 1

## 2022-06-07 MED ORDER — KETOROLAC TROMETHAMINE 30 MG/ML IJ SOLN: INTRAMUSCULAR | Status: DC | PRN

## 2022-06-07 MED ORDER — MIDAZOLAM HCL 2 MG/2ML IJ SOLN
INTRAMUSCULAR | Status: AC
  Filled 2022-06-07: qty 2

## 2022-06-07 MED ORDER — SODIUM CHLORIDE 0.9 % IR SOLN
Status: DC | PRN
  Administered 2022-06-07: 3000 mL
  Administered 2022-06-07: 1000 mL
  Administered 2022-06-07: 3000 mL

## 2022-06-07 MED ORDER — PROPOFOL 10 MG/ML IV BOLUS
INTRAVENOUS | Status: DC | PRN
  Administered 2022-06-07: 100 mg via INTRAVENOUS

## 2022-06-07 MED ORDER — EPINEPHRINE PF 1 MG/ML IJ SOLN
INTRAMUSCULAR | Status: AC
  Filled 2022-06-07: qty 1

## 2022-06-07 MED ORDER — DEXAMETHASONE SODIUM PHOSPHATE 10 MG/ML IJ SOLN
INTRAMUSCULAR | Status: DC | PRN
  Administered 2022-06-07: 5 mg via INTRAVENOUS

## 2022-06-07 MED ORDER — SODIUM CHLORIDE (PF) 0.9 % IJ SOLN: INTRAMUSCULAR | Status: DC | PRN

## 2022-06-07 MED ORDER — EPHEDRINE 5 MG/ML INJ
INTRAVENOUS | Status: AC
  Filled 2022-06-07: qty 5

## 2022-06-07 MED ORDER — DEXAMETHASONE SODIUM PHOSPHATE 10 MG/ML IJ SOLN
INTRAMUSCULAR | Status: AC
  Filled 2022-06-07: qty 1

## 2022-06-07 MED ORDER — HYDROMORPHONE HCL 1 MG/ML IJ SOLN
INTRAMUSCULAR | Status: AC
  Filled 2022-06-07: qty 2

## 2022-06-07 MED ORDER — KETOROLAC TROMETHAMINE 30 MG/ML IJ SOLN
30.0000 mg | Freq: Once | INTRAMUSCULAR | Status: AC
  Administered 2022-06-07: 30 mg via INTRAVENOUS

## 2022-06-07 MED ORDER — ONDANSETRON HCL 4 MG PO TABS: 4.0000 mg | ORAL_TABLET | Freq: Four times a day (QID) | ORAL | Status: DC | PRN

## 2022-06-07 MED ORDER — SENNA 8.6 MG PO TABS
1.0000 | ORAL_TABLET | Freq: Two times a day (BID) | ORAL | Status: DC
  Administered 2022-06-07 – 2022-06-18 (×6): 8.6 mg via ORAL
  Filled 2022-06-07 (×13): qty 1

## 2022-06-07 MED ORDER — TRANEXAMIC ACID-NACL 1000-0.7 MG/100ML-% IV SOLN
INTRAVENOUS | Status: AC
  Filled 2022-06-07: qty 100

## 2022-06-07 MED ORDER — CEFAZOLIN SODIUM-DEXTROSE 2-4 GM/100ML-% IV SOLN
INTRAVENOUS | Status: AC
  Filled 2022-06-07: qty 100

## 2022-06-07 MED ORDER — BUPIVACAINE-EPINEPHRINE 0.25% -1:200000 IJ SOLN: INTRAMUSCULAR | Status: DC | PRN

## 2022-06-07 MED ORDER — OXYCODONE HCL 5 MG PO TABS
ORAL_TABLET | ORAL | Status: AC
  Filled 2022-06-07: qty 1

## 2022-06-07 MED ORDER — MIDAZOLAM HCL 2 MG/2ML IJ SOLN
INTRAMUSCULAR | Status: DC | PRN
  Administered 2022-06-07 (×2): 1 mg via INTRAVENOUS

## 2022-06-07 MED ORDER — CHLORHEXIDINE GLUCONATE 4 % EX SOLN: 60.0000 mL | Freq: Once | CUTANEOUS | Status: DC

## 2022-06-07 MED ORDER — SUCCINYLCHOLINE CHLORIDE 200 MG/10ML IV SOSY
PREFILLED_SYRINGE | INTRAVENOUS | Status: AC
  Filled 2022-06-07: qty 10

## 2022-06-07 MED ORDER — ACETAMINOPHEN 325 MG PO TABS: 325.0000 mg | ORAL_TABLET | ORAL | Status: DC | PRN

## 2022-06-07 MED ORDER — PROPOFOL 500 MG/50ML IV EMUL
INTRAVENOUS | Status: AC
  Filled 2022-06-07: qty 50

## 2022-06-07 MED ORDER — PHENOL 1.4 % MT LIQD: 1.0000 | OROMUCOSAL | Status: DC | PRN

## 2022-06-07 MED ORDER — ENOXAPARIN SODIUM 30 MG/0.3ML IJ SOSY
30.0000 mg | PREFILLED_SYRINGE | Freq: Two times a day (BID) | INTRAMUSCULAR | Status: DC
  Administered 2022-06-08 – 2022-06-18 (×21): 30 mg via SUBCUTANEOUS
  Filled 2022-06-07 (×21): qty 0.3

## 2022-06-07 MED ORDER — DOCUSATE SODIUM 100 MG PO CAPS
100.0000 mg | ORAL_CAPSULE | Freq: Two times a day (BID) | ORAL | Status: DC
  Administered 2022-06-07 – 2022-06-18 (×9): 100 mg via ORAL
  Filled 2022-06-07 (×15): qty 1

## 2022-06-07 MED ORDER — HYDROMORPHONE HCL 1 MG/ML IJ SOLN
0.2500 mg | INTRAMUSCULAR | Status: AC | PRN
  Administered 2022-06-07 (×4): 0.5 mg via INTRAVENOUS

## 2022-06-07 MED ORDER — METOCLOPRAMIDE HCL 5 MG/ML IJ SOLN: 5.0000 mg | Freq: Three times a day (TID) | INTRAMUSCULAR | Status: DC | PRN

## 2022-06-07 MED ORDER — DIPHENHYDRAMINE HCL 12.5 MG/5ML PO ELIX
12.5000 mg | ORAL_SOLUTION | ORAL | Status: DC | PRN
  Administered 2022-06-11: 25 mg via ORAL
  Filled 2022-06-07: qty 10

## 2022-06-07 MED ORDER — METOCLOPRAMIDE HCL 5 MG PO TABS: 5.0000 mg | ORAL_TABLET | Freq: Three times a day (TID) | ORAL | Status: DC | PRN

## 2022-06-07 MED ORDER — DEXMEDETOMIDINE HCL IN NACL 80 MCG/20ML IV SOLN
INTRAVENOUS | Status: AC
  Filled 2022-06-07: qty 20

## 2022-06-07 MED ORDER — HYDROCODONE-ACETAMINOPHEN 5-325 MG PO TABS
1.0000 | ORAL_TABLET | ORAL | Status: DC | PRN
  Administered 2022-06-07 – 2022-06-09 (×7): 2 via ORAL
  Filled 2022-06-07 (×7): qty 2

## 2022-06-07 MED ORDER — CEFAZOLIN SODIUM-DEXTROSE 2-4 GM/100ML-% IV SOLN
2.0000 g | INTRAVENOUS | Status: AC
  Administered 2022-06-07: 2 g via INTRAVENOUS

## 2022-06-07 MED ORDER — SODIUM CHLORIDE (PF) 0.9 % IJ SOLN
INTRAMUSCULAR | Status: AC
  Filled 2022-06-07: qty 50

## 2022-06-07 MED ORDER — ROCURONIUM BROMIDE 10 MG/ML (PF) SYRINGE
PREFILLED_SYRINGE | INTRAVENOUS | Status: AC
  Filled 2022-06-07: qty 10

## 2022-06-07 MED ORDER — BUPIVACAINE HCL 0.25 % IJ SOLN
INTRAMUSCULAR | Status: AC
  Filled 2022-06-07: qty 1

## 2022-06-07 MED ORDER — DEXMEDETOMIDINE HCL IN NACL 80 MCG/20ML IV SOLN
INTRAVENOUS | Status: DC | PRN
  Administered 2022-06-07 (×2): 4 ug via INTRAVENOUS
  Administered 2022-06-07: 8 ug via INTRAVENOUS

## 2022-06-07 MED ORDER — EPHEDRINE SULFATE-NACL 50-0.9 MG/10ML-% IV SOSY
PREFILLED_SYRINGE | INTRAVENOUS | Status: DC | PRN
  Administered 2022-06-07 (×3): 7.5 mg via INTRAVENOUS

## 2022-06-07 MED ORDER — ACETAMINOPHEN 10 MG/ML IV SOLN
INTRAVENOUS | Status: AC
  Filled 2022-06-07: qty 100

## 2022-06-07 MED ORDER — LACTATED RINGERS IV SOLN: INTRAVENOUS | Status: DC | PRN

## 2022-06-07 MED ORDER — TRANEXAMIC ACID-NACL 1000-0.7 MG/100ML-% IV SOLN
1000.0000 mg | INTRAVENOUS | Status: AC
  Administered 2022-06-07: 1000 mg via INTRAVENOUS

## 2022-06-07 MED ORDER — AMLODIPINE BESYLATE 10 MG PO TABS
10.0000 mg | ORAL_TABLET | Freq: Every day | ORAL | Status: DC
  Administered 2022-06-07 – 2022-06-18 (×12): 10 mg via ORAL
  Filled 2022-06-07 (×12): qty 1

## 2022-06-07 MED ORDER — LIDOCAINE HCL (PF) 2 % IJ SOLN
INTRAMUSCULAR | Status: AC
  Filled 2022-06-07: qty 10

## 2022-06-07 MED ORDER — METHOCARBAMOL 500 MG PO TABS
ORAL_TABLET | ORAL | Status: AC
  Filled 2022-06-07: qty 1

## 2022-06-07 MED ORDER — ACETAMINOPHEN 325 MG PO TABS
325.0000 mg | ORAL_TABLET | Freq: Four times a day (QID) | ORAL | Status: DC | PRN
  Administered 2022-06-11 – 2022-06-16 (×7): 650 mg via ORAL
  Filled 2022-06-07 (×7): qty 2

## 2022-06-07 MED ORDER — HYDROCODONE-ACETAMINOPHEN 7.5-325 MG PO TABS: 1.0000 | ORAL_TABLET | ORAL | Status: DC | PRN

## 2022-06-07 MED ORDER — ACETAMINOPHEN 160 MG/5ML PO SOLN: 325.0000 mg | ORAL | Status: DC | PRN

## 2022-06-07 MED ORDER — LIDOCAINE 2% (20 MG/ML) 5 ML SYRINGE
INTRAMUSCULAR | Status: DC | PRN
  Administered 2022-06-07: 60 mg via INTRAVENOUS

## 2022-06-07 MED ORDER — POLYETHYLENE GLYCOL 3350 17 G PO PACK: 17.0000 g | PACK | Freq: Every day | ORAL | Status: DC | PRN

## 2022-06-07 MED ORDER — PHENYLEPHRINE 80 MCG/ML (10ML) SYRINGE FOR IV PUSH (FOR BLOOD PRESSURE SUPPORT)
PREFILLED_SYRINGE | INTRAVENOUS | Status: AC
  Filled 2022-06-07: qty 10

## 2022-06-07 SURGICAL SUPPLY — 76 items
ADH SKN CLS APL DERMABOND .7 (GAUZE/BANDAGES/DRESSINGS) ×2
APL PRP STRL LF DISP 70% ISPRP (MISCELLANEOUS) ×2
AXLE ORTHOPEDIC SALVAGE SYSTEM (Knees) IMPLANT
AXLE TIB LFRIC INTFC KN OSS (Knees) ×1 IMPLANT
BAG COUNTER SPONGE SURGICOUNT (BAG) IMPLANT
BAG SPNG CNTER NS LX DISP (BAG)
BEARING TIBIAL OSS ARCOM 12MM (Knees) IMPLANT
BNDG CMPR 5X4 KNIT ELC UNQ LF (GAUZE/BANDAGES/DRESSINGS) ×1
BNDG CMPR 5X62 HK CLSR LF (GAUZE/BANDAGES/DRESSINGS) ×1
BNDG CMPR MED 10X6 ELC LF (GAUZE/BANDAGES/DRESSINGS) ×1
BNDG ELASTIC 4INX 5YD STR LF (GAUZE/BANDAGES/DRESSINGS) ×2 IMPLANT
BNDG ELASTIC 6INX 5YD STR LF (GAUZE/BANDAGES/DRESSINGS) ×2 IMPLANT
BNDG ELASTIC 6X10 VLCR STRL LF (GAUZE/BANDAGES/DRESSINGS) IMPLANT
BRNG TIB STD 12 STRL KN (Knees) ×1 IMPLANT
BUSHING TIBIAL OSS ARCOM (Knees) IMPLANT
CHLORAPREP W/TINT 26 (MISCELLANEOUS) ×4 IMPLANT
COVER SURGICAL LIGHT HANDLE (MISCELLANEOUS) ×2 IMPLANT
CUFF TOURN SGL QUICK 34 (TOURNIQUET CUFF) ×1
CUFF TRNQT CYL 34X4.125X (TOURNIQUET CUFF) ×2 IMPLANT
DERMABOND ADVANCED .7 DNX12 (GAUZE/BANDAGES/DRESSINGS) ×4 IMPLANT
DRAPE INCISE IOBAN 66X45 STRL (DRAPES) ×6 IMPLANT
DRAPE SHEET LG 3/4 BI-LAMINATE (DRAPES) ×6 IMPLANT
DRAPE U-SHAPE 47X51 STRL (DRAPES) ×2 IMPLANT
DRSG AQUACEL AG ADV 3.5X10 (GAUZE/BANDAGES/DRESSINGS) ×2 IMPLANT
DRSG TEGADERM 4X4.75 (GAUZE/BANDAGES/DRESSINGS) IMPLANT
DRSG VAC GRANUFOAM LG (GAUZE/BANDAGES/DRESSINGS) IMPLANT
DRSG VERSA FOAM LRG 10X15 (GAUZE/BANDAGES/DRESSINGS) IMPLANT
ELECT BLADE TIP CTD 4 INCH (ELECTRODE) IMPLANT
ELECT REM PT RETURN 15FT ADLT (MISCELLANEOUS) ×2 IMPLANT
EVACUATOR 1/8 PVC DRAIN (DRAIN) IMPLANT
FEMORAL BUSHING OSS ARCOM SET (Knees) IMPLANT
GAUZE SPONGE 2X2 8PLY STRL LF (GAUZE/BANDAGES/DRESSINGS) IMPLANT
GAUZE SPONGE 4X4 12PLY STRL (GAUZE/BANDAGES/DRESSINGS) ×2 IMPLANT
GLOVE BIO SURGEON STRL SZ8.5 (GLOVE) ×4 IMPLANT
GLOVE BIOGEL M 7.0 STRL (GLOVE) ×2 IMPLANT
GLOVE BIOGEL PI IND STRL 7.5 (GLOVE) ×2 IMPLANT
GLOVE BIOGEL PI IND STRL 8 (GLOVE) ×2 IMPLANT
GLOVE BIOGEL PI IND STRL 8.5 (GLOVE) ×2 IMPLANT
GLOVE SURG LX STRL 7.5 STRW (GLOVE) ×4 IMPLANT
GOWN STRL REUS W/TWL 2XL LVL3 (GOWN DISPOSABLE) ×2 IMPLANT
HANDPIECE INTERPULSE COAX TIP (DISPOSABLE) ×1
HOOD PEEL AWAY T7 (MISCELLANEOUS) ×2 IMPLANT
IMMOBILIZER KNEE 20 (SOFTGOODS) ×1 IMPLANT
IMMOBILIZER KNEE 20 THIGH 36 (SOFTGOODS) IMPLANT
JET LAVAGE IRRISEPT WOUND (IRRIGATION / IRRIGATOR)
KIT TURNOVER KIT A (KITS) IMPLANT
LAVAGE JET IRRISEPT WOUND (IRRIGATION / IRRIGATOR) IMPLANT
MARKER SKIN DUAL TIP RULER LAB (MISCELLANEOUS) ×2 IMPLANT
NDL SPNL 18GX3.5 QUINCKE PK (NEEDLE) ×2 IMPLANT
NEEDLE SPNL 18GX3.5 QUINCKE PK (NEEDLE) ×1 IMPLANT
NS IRRIG 1000ML POUR BTL (IV SOLUTION) ×2 IMPLANT
PACK TOTAL KNEE CUSTOM (KITS) ×2 IMPLANT
PADDING CAST ABS COTTON 6X4 NS (CAST SUPPLIES) IMPLANT
PIN LOCKING OSS ARCOM (Knees) IMPLANT
PROTECTOR NERVE ULNAR (MISCELLANEOUS) ×2 IMPLANT
SET HNDPC FAN SPRY TIP SCT (DISPOSABLE) ×2 IMPLANT
SPIKE FLUID TRANSFER (MISCELLANEOUS) ×2 IMPLANT
SUT ETHILON 2 0 PSLX (SUTURE) IMPLANT
SUT MNCRL AB 3-0 PS2 18 (SUTURE) ×2 IMPLANT
SUT MON AB 2-0 CT1 36 (SUTURE) ×2 IMPLANT
SUT PDS AB 1 CTX 36 (SUTURE) IMPLANT
SUT STRATAFIX PDO 1 14 VIOLET (SUTURE) ×1
SUT STRATFX PDO 1 14 VIOLET (SUTURE) ×1
SUT VIC AB 1 CT1 36 (SUTURE) ×2 IMPLANT
SUT VIC AB 2-0 CT1 27 (SUTURE) ×1
SUT VIC AB 2-0 CT1 TAPERPNT 27 (SUTURE) ×2 IMPLANT
SUTURE STRATFX PDO 1 14 VIOLET (SUTURE) ×2 IMPLANT
SWAB COLLECTION DEVICE MRSA (MISCELLANEOUS) ×2 IMPLANT
SWAB CULTURE ESWAB REG 1ML (MISCELLANEOUS) ×2 IMPLANT
SYR 50ML LL SCALE MARK (SYRINGE) ×2 IMPLANT
SYSTEM YOKE ORTHOPEDIC SALVAGE (Knees) IMPLANT
TIBIAL BEARING OSS ARCOM 12MM (Knees) ×1 IMPLANT
TIBIAL BUSHING OSS ARCOM (Knees) ×1 IMPLANT
TRAY FOLEY MTR SLVR 16FR STAT (SET/KITS/TRAYS/PACK) ×2 IMPLANT
WRAP KNEE MAXI GEL POST OP (GAUZE/BANDAGES/DRESSINGS) ×2 IMPLANT
YOKE ORTHOPEDIC SALVAGE SYSTEM (Knees) ×1 IMPLANT

## 2022-06-07 NOTE — Anesthesia Postprocedure Evaluation (Signed)
Anesthesia Post Note  Patient: Tammy [name]  Procedure(s) Performed: IRRIGATION AND DEBRIDEMENT KNEE WITH POLY EXCHANGE (Left: Knee)     Patient location during evaluation: PACU Anesthesia Type: General Level of consciousness: awake and alert Pain management: pain level controlled Vital Signs Assessment: post-procedure vital signs reviewed and stable Respiratory status: spontaneous breathing, nonlabored ventilation, respiratory function stable and patient connected to nasal cannula oxygen Cardiovascular status: blood pressure returned to baseline and stable Postop Assessment: no apparent nausea or vomiting Anesthetic complications: no   No notable events documented.  Last Vitals:  Vitals:   06/07/22 1130 06/07/22 1145  BP: (!) 140/72 132/69  Pulse: 63 69  Resp: 11 16  Temp:    SpO2: 100% 100%    Last Pain:  Vitals:   06/07/22 1145  TempSrc:   PainSc: 8                  Kathleene Bergemann

## 2022-06-07 NOTE — Plan of Care (Signed)
  Problem: Education: Goal: Knowledge of General Education information will improve Description Including pain rating scale, medication(s)/side effects and non-pharmacologic comfort measures Outcome: Progressing   Problem: Activity: Goal: Risk for activity intolerance will decrease Outcome: Progressing   Problem: Safety: Goal: Ability to remain free from injury will improve Outcome: Progressing   

## 2022-06-07 NOTE — Op Note (Addendum)
OPERATIVE REPORT   06/07/2022  10:20 AM  PATIENT:  Tammy [name]   SURGEON:  Tammy Pesa, MD  ASSISTANT:  Tammy Bolder, PA-C.   PREOPERATIVE DIAGNOSIS:  Left knee wound status post debridement and VAC placement.  POSTOPERATIVE DIAGNOSIS:  Same.  PROCEDURE:  1.  Excisional debridement of skin and subcutaneous tissue left knee. 2.  Left knee debridement, radical synovectomy, and rotating hinge bearing exchange. 3.  Placement of wound VAC.  ANESTHESIA:   GETA.  ESTIMATED BLOOD LOSS: 200 mL.  ANTIBIOTICS:  Receiving scheduled IV vancomycin and zosyn.  EXPLANTS: Rotating hinge bearing with 12 mm poly insert.  IMPLANTS:  Rotating hinge bearing with 12 mm poly insert.  TUBES AND DRAINS: 1. Medium HV in knee joint. 2. Wound VAC at 125 mmHg.  SPECIMENS:  Left knee pseudocapsule for aerobic and anaerobic tissue culture.  COMPLICATIONS:  None.  DISPOSITION:  Stable to PACU.  SURGICAL INDICATIONS:  Tammy [name] is a 67 y.o. female who underwent hardware removal, left distal femur replacement on 04/30/2022.  I brought her to the operating room on 06/04/2022 for debridement of necrotic skin.  She was found to have a small subcutaneous fluid collection.  The culture is growing rare Enterococcus faecalis with pending susceptibilities.  She has been on IV vancomycin and Zosyn.  At the time of the last debridement, she was found to have popped 2 of her arthrotomy sutures.  The plan was made to bring her [name] to the operating room for repeat debridement and exchange of her rotating-hinge bearing.  She presents today for this.  The risks, benefits, and alternatives were discussed with the patient preoperatively including but not limited to the risks of infection, bleeding, nerve / blood vessel injury, wound healing problems, loss of limb, cardiopulmonary complications, the need for repeat surgery, among others, and the patient was willing to proceed.  PROCEDURE IN  DETAIL: Patient was identified in the holding area using 2 identifiers.  The surgical site was marked by myself.  She was taken to the operating room and placed supine on the operating room table.  General anesthesia was obtained.  All bony prominences were well-padded.  A bump was placed under the left hip.  Her wound VAC and sutures were removed.  The left lower extremity was prepped and draped in the normal sterile surgical fashion.  Timeout was called, verifying site and site of surgery.  She did receive IV antibiotics within 60 minutes of beginning the procedure.   I began by examining the left knee.  Her wound looked great.  There was 1 small area of nonviable skin at the superior aspect of her open wound which was excisionally debrided with a 10 blade.  Using a #10 blade, I sharply excised her previous incision.  Full-thickness skin flaps were raised.  There was no subcutaneous fluid collection.  There was no necrotic tissue.  She had healthy granulation tissue within the wound proximal to the patella.  I identified her arthrotomy and removed the sutures.  The fluid within the knee was benign.  I created a limited lateral release for tibial exposure.  With the knee in full extension, I performed a radical synovectomy of the medial gutter, lateral gutter, and suprapatellar pouch.  I debrided pseudocapsule from around the patellar component.  The patellar component was well-fixed.  The knee was flexed.  I removed the entire rotating-hinge bearing including all bushings.  The knee was brought into extension, and I performed a synovectomy of  the posterior compartment.  Her knee joint was benign without any purulent material or necrotic tissue.  A representative sample of pseudocapsule was sent for tissue culture. The knee was then irrigated with Irrisept solution and 6 L of normal saline using pulsatile lavage.  The new 12 mm rotating hinge bearing including all bushings was inserted.  The arthrotomy was  reapproximated with #1 PDS suture and #1 strata fix over a medium Hemovac drain.  The superior and inferior parts of the incision were closed with 2-0 Monocryl for the deep dermal layer, and staples + 2-0 nylon vertical mattress sutures.  I placed a black GranuFoam sponge into the lateral aspect of the wound proximally to prevent formation of seroma.  Black GranuFoam was also placed over Adaptic at the inferior and superior aspects of the surgical incision to form an incisional VAC.  Over the patellar tendon I placed the white VAC sponge.  The VAC was then hooked up to suction at 125 mmHg.  There was excellent seal without any leak.  A bulky dressing was placed with cast padding and an Ace wrap.  A knee immobilizer was applied.  The patient was then awakened from anesthesia and taken to the PACU in stable condition.  Sponge, needle, and instrument counts were correct at the end the case x 2.  There were no known complications.   POSTOPERATIVE PLAN: Postoperatively, the patient be readmitted to the orthopedic floor.  She may weight-bear as tolerated.  Knee immobilizer at all times; do not bend the knee.  We will follow her intraoperative cultures.  Broad-spectrum IV antibiotics for now.  I have consulted Dr. Ulice [name] with plastic surgery, who plans to take the patient [name] to the OR this upcoming week.  Once we have the final culture and susceptibilities, we will place an infectious disease consult for IV selection and duration.  She will need a PICC line for long-term IV antibiotics.  Routine chemical DVT prophylaxis to start tomorrow am.

## 2022-06-07 NOTE — Interval H&P Note (Signed)
History and Physical Interval Note:  06/07/2022 7:37 AM  Tammy [name] [name]  has presented today for surgery, with the diagnosis of poly exchange.  The various methods of treatment have been discussed with the patient and family. After consideration of risks, benefits and other options for treatment, the patient has consented to  Procedure(s): IRRIGATION AND DEBRIDEMENT KNEE WITH POLY EXCHANGE (Left) as a surgical intervention.  The patient's history has been reviewed, patient examined, no change in status, stable for surgery.  I have reviewed the patient's chart and labs.  Questions were answered to the patient's satisfaction.     Iline Oven Pratt Bress

## 2022-06-07 NOTE — Anesthesia Procedure Notes (Signed)
Date/Time: 06/07/2022 10:27 AM  Performed by: Minerva Ends, CRNAOxygen Delivery Method: Simple face mask Placement Confirmation: positive ETCO2 and breath sounds checked- equal and bilateral Dental Injury: Teeth and Oropharynx as per pre-operative assessment

## 2022-06-07 NOTE — Anesthesia Procedure Notes (Signed)
Procedure Name: LMA Insertion Date/Time: 06/07/2022 7:46 AM  Performed by: Minerva Ends, CRNAPre-anesthesia Checklist: Patient identified, Emergency Drugs available, Suction available and Patient being monitored Patient Re-evaluated:Patient Re-evaluated prior to induction Oxygen Delivery Method: Circle System Utilized Preoxygenation: Pre-oxygenation with 100% oxygen Induction Type: IV induction Ventilation: Mask ventilation without difficulty LMA: LMA inserted and LMA with gastric port inserted LMA Size: 4.0 Number of attempts: 1 Placement Confirmation: positive ETCO2 Tube secured with: Tape Dental Injury: Teeth and Oropharynx as per pre-operative assessment  Comments: IV induction Oddo no-- LMA inserted am crna-- atrumatic-- missing many teeth -- partial plates out prior to arrival to holding-- remaining teeth unchanged

## 2022-06-07 NOTE — Transfer of Care (Signed)
Immediate Anesthesia Transfer of Care Note  Patient: Tammy [name]  Procedure(s) Performed: IRRIGATION AND DEBRIDEMENT KNEE WITH POLY EXCHANGE (Left: Knee)  Patient Location: PACU  Anesthesia Type:General  Level of Consciousness: sedated  Airway & Oxygen Therapy: Patient Spontanous Breathing and Patient connected to face mask oxygen  Post-op Assessment: Report given to RN and Post -op Vital signs reviewed and stable  Post vital signs: Reviewed and stable  Last Vitals:  Vitals Value Taken Time  BP 147/83 06/07/22 1035  Temp 36.5 C 06/07/22 1035  Pulse 75 06/07/22 1037  Resp 18 06/07/22 1037  SpO2 98 % 06/07/22 1037  Vitals shown include unvalidated device data.  Last Pain:  Vitals:   06/07/22 0710  TempSrc:   PainSc: 0-No pain      Patients Stated Pain Goal: 3 (06/06/22 1817)  Complications: No notable events documented.

## 2022-06-07 NOTE — Progress Notes (Signed)
Pharmacy Antibiotic Note  Tammy [name] is a 67 y.o. female admitted on 06/04/2022 with left knee wound dehiscence. Underwent I&D, placement of wound VAC in OR today. Pharmacy has been consulted for Vancomycin and Zosyn dosing post-operatively.  Day #4 of abx for L knee wound infection s/p I&D. Went to OR again today for I&D and VAC placement. Cx's have now been growing rare E. Faecalis. Amp sensitive. Cx's have not grown other bacteria.  Plan: Continue Zosyn 3.375 gm IV q8h (4 hour infusion) Continue vancomycin 1,250mg  IV Q24h Monitor clinical picture, renal function, vanc levels prn F/U C&S, abx deescalation / LOT  May be able to narrow to Unasyn or Ampicillin for targeted therapy. Ortho consulting ID for recs  Height: 5\' 2"  (157.5 cm) Weight: 76 kg (167 lb 8.8 oz) IBW/kg (Calculated) : 50.1  Temp (24hrs), Avg:98.1 F (36.7 C), Min:97.7 F (36.5 C), Max:98.7 F (37.1 C)  Recent Labs  Lab 06/05/22 0327 06/06/22 0324 06/06/22 0849 06/06/22 1016 06/07/22 0324  WBC 5.7 7.7  --   --  5.5  CREATININE 0.99 0.92 0.78 0.79 0.85     Estimated Creatinine Clearance: 61.3 mL/min (by C-G formula based on SCr of 0.85 mg/dL).    Allergies  Allergen Reactions   Lisinopril Cough    Antimicrobials this admission: 4/25 Cefazolin x 1 4/25 Vancomycin >> 4/25 Zosyn >>  Dose adjustments this admission: --  Microbiology results: 4/25 L knee synovial fluid: rare E. faecalis  Thank you for allowing pharmacy to be a part of this patient's care.  Enzo Bi, PharmD, BCPS, BCIDP Clinical Pharmacist 06/07/2022 12:51 PM

## 2022-06-07 NOTE — Progress Notes (Signed)
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 [name] for multistage operative repair of left distal femur wound.  She fell and broke her distal femur last year, was taken to the OR on 04/10/2021 by Dr. Carola Frost for ORIF, left distal femur with intercondylar extension.  She then underwent hardware removal and distal femur replacement on 04/30/2022.  She has not been readmitted by orthopedic surgery on 4/25 due to wound dehiscence of surgical site.  She was taken for debridement and washout with placement of wound VAC on 4/25.   Dr. [name] plans larger reconstruction surgery with the assistance of plastic surgery during this hospital stay, requests hospitalist assistance for electrolyte management and to ensure patient readiness for surgery.   Subjective   Patient seen and examined, came back from surgery, underwent I&D and placement of wound VAC in OR today.   Assessment/Plan:    Hypokalemia -Patient has had chronically low potassium for past 3 years -In the last she was on hydrochlorothiazide for 15 years which she stopped 3 years ago -No other home medication likely causing hypokalemia -Serum magnesium 1.8 -Potassium has improved to 3.7 this morning  Hypertension -Currently she is on hydralazine 25 mg p.o. twice daily, patient's home dose -Amlodipine is currently on hold; will restart amlodipine from tonight as blood pressure started to rise -Patient states that she developed cough with lisinopril, has not tried ARB's in the past  Normocytic anemia -Chronic, unclear etiology -Likely anemia of chronic disease -S/p 2 unit PRBC, hemoglobin this morning is 11.5 -Denies melena or hematochezia -Follow labs in the  hospital  Left distal femur wound repair -Wound VAC in place -Orthopedics following  Medications     anastrozole  1 mg Oral Daily   vitamin C  1,000 mg Oral Daily   buPROPion  150 mg Oral BID   calcium carbonate  1 tablet Oral Q breakfast   celecoxib  200 mg Oral BID   cyanocobalamin  1,000 mcg Oral Daily   docusate sodium  100 mg Oral BID   [START ON 06/08/2022] enoxaparin (LOVENOX) injection  30 mg Subcutaneous Q12H   fentaNYL       gabapentin  100 mg Oral BID   hydrALAZINE  25 mg Oral BID   HYDROmorphone       ketorolac       methocarbamol       multivitamin with minerals  1 tablet Oral Daily   oxyCODONE       pantoprazole  40 mg Oral Daily   potassium chloride  10 mEq Oral Daily   senna  1 tablet Oral BID   zinc sulfate  220 mg Oral Daily     Data Reviewed:   CBG:  No results for input(s): "GLUCAP" in the last 168 hours.  SpO2: 99 % O2 Flow Rate (L/min): 2 L/min    Vitals:   06/07/22 1215 06/07/22 1230 06/07/22 1244 06/07/22 1333  BP: 129/71 124/70 130/74 (!) 145/89  Pulse: 72 70 75 78  Resp: 12 12 16 20   Temp:   98 F (36.7 C) 99.4 F (37.4 C)  TempSrc:   Oral Oral  SpO2: 100% 100% 98% 99%  Weight:      Height:  Data Reviewed:  Basic Metabolic Panel: Recent Labs  Lab 06/04/22 1201 06/05/22 0327 06/06/22 0324 06/06/22 0849 06/06/22 1016 06/06/22 1654 06/07/22 0324  NA 142 137  --  139 137  --  138  K 3.3* 2.8*  --  2.9* 2.9* 3.3* 3.7  CL 104 102  --  106 103  --  105  CO2  --  24  --  22 23  --  23  GLUCOSE 97 141*  --  85 88  --  94  BUN 9 12  --  13 12  --  14  CREATININE 0.90 0.99 0.92 0.78 0.79  --  0.85  CALCIUM  --  8.1*  --  8.2* 8.3*  --  8.6*  MG  --   --   --  1.8  --   --   --     CBC: Recent Labs  Lab 06/05/22 0327 06/05/22 1844 06/06/22 0324 06/06/22 1944 06/07/22 0324  WBC 5.7  --  7.7  --  5.5  HGB 6.5* 8.6* 8.7* 11.2* 11.5*  HCT 20.4* 26.6* 26.4* 33.8* 34.9*  MCV 88.7  --  87.7  --  87.7  PLT 344   --  308  --  317    LFT Recent Labs  Lab 06/06/22 1016  AST 13*  ALT 9  ALKPHOS 54  BILITOT 0.6  PROT 6.3*  ALBUMIN 2.5*     Antibiotics: Anti-infectives (From admission, onward)    Start     Dose/Rate Route Frequency Ordered Stop   06/07/22 0730  ceFAZolin (ANCEF) IVPB 2g/100 mL premix        2 g 200 mL/hr over 30 Minutes Intravenous On call to O.R. 06/07/22 0631 06/07/22 0749   06/07/22 1610  ceFAZolin (ANCEF) 2-4 GM/100ML-% IVPB       Note to Pharmacy: Myrlene Broker M: cabinet override      06/07/22 0633 06/07/22 0825   06/05/22 0600  ceFAZolin (ANCEF) IVPB 2g/100 mL premix  Status:  Discontinued        2 g 200 mL/hr over 30 Minutes Intravenous On call to O.R. 06/04/22 1314 06/04/22 1317   06/05/22 0500  vancomycin (VANCOREADY) IVPB 1250 mg/250 mL        1,250 mg 166.7 mL/hr over 90 Minutes Intravenous Every 24 hours 06/04/22 1829     06/04/22 2000  piperacillin-tazobactam (ZOSYN) IVPB 3.375 g        3.375 g 12.5 mL/hr over 240 Minutes Intravenous Every 8 hours 06/04/22 1819     06/04/22 1900  ceFAZolin (ANCEF) IVPB 2g/100 mL premix  Status:  Discontinued        2 g 200 mL/hr over 30 Minutes Intravenous Every 6 hours 06/04/22 1802 06/04/22 1810   06/04/22 1330  vancomycin (VANCOCIN) IVPB 1000 mg/200 mL premix        1,000 mg 200 mL/hr over 60 Minutes Intravenous On call to O.R. 06/04/22 1314 06/04/22 1423   06/04/22 1330  ceFAZolin (ANCEF) IVPB 2g/100 mL premix        2 g 200 mL/hr over 30 Minutes Intravenous On call to O.R. 06/04/22 1317 06/04/22 1528           Objective    Physical Examination:   General-appears in no acute distress Heart-S1-S2, regular, no murmur auscultated Lungs-clear to auscultation bilaterally, no wheezing or crackles auscultated Abdomen-soft, nontender, no organomegaly Extremities-wound VAC in place in left lower extremity, left knee in dressing Neuro-alert, oriented x3, no focal deficit  noted             Meredeth Ide   [facility] If 7PM-7AM, please contact night-coverage at [email or web address], Office  [phone]   06/07/2022, 2:15 PM  LOS: 3 days

## 2022-06-08 ENCOUNTER — Ambulatory Visit: Payer: Medicare Other | Admitting: Physical Therapy

## 2022-06-08 ENCOUNTER — Encounter (HOSPITAL_COMMUNITY): Payer: Self-pay | Admitting: Orthopedic Surgery

## 2022-06-08 DIAGNOSIS — I10 Essential (primary) hypertension: Secondary | ICD-10-CM | POA: Diagnosis not present

## 2022-06-08 DIAGNOSIS — E876 Hypokalemia: Secondary | ICD-10-CM | POA: Diagnosis not present

## 2022-06-08 LAB — BASIC METABOLIC PANEL
Anion gap: 8 (ref 5–15)
BUN: 13 mg/dL (ref 8–23)
CO2: 24 mmol/L (ref 22–32)
Calcium: 8.5 mg/dL — ABNORMAL LOW (ref 8.9–10.3)
Chloride: 105 mmol/L (ref 98–111)
Creatinine, Ser: 0.78 mg/dL (ref 0.44–1.00)
GFR, Estimated: >60 mL/min (ref >60)
Glucose, Bld: 121 mg/dL — ABNORMAL HIGH (ref 70–99)
Potassium: 3.7 mmol/L (ref 3.5–5.1)
Sodium: 137 mmol/L (ref 135–145)

## 2022-06-08 LAB — CBC
HCT: 25.8 % — ABNORMAL LOW (ref 36.0–46.0)
Hemoglobin: 8.3 g/dL — ABNORMAL LOW (ref 12.0–15.0)
MCH: 28.9 pg (ref 26.0–34.0)
MCHC: 32.2 g/dL (ref 30.0–36.0)
MCV: 89.9 fL (ref 80.0–100.0)
Platelets: 307 10*3/uL (ref 150–400)
RBC: 2.87 MIL/uL — ABNORMAL LOW (ref 3.87–5.11)
RDW: 15.5 % (ref 11.5–15.5)
WBC: 9.2 10*3/uL (ref 4.0–10.5)
nRBC: 0 % (ref 0.0–0.2)

## 2022-06-08 LAB — AEROBIC/ANAEROBIC CULTURE W GRAM STAIN (SURGICAL/DEEP WOUND)

## 2022-06-08 MED ORDER — JUVEN PO PACK
1.0000 | PACK | Freq: Two times a day (BID) | ORAL | Status: DC
  Administered 2022-06-08 – 2022-06-18 (×17): 1 via ORAL
  Filled 2022-06-08 (×21): qty 1

## 2022-06-08 NOTE — Discharge Instructions (Signed)
Potassium Content Of Foods  You can eat healthy foods with potassium every day. Your body needs this mineral to work properly. It helps your nerves to function and muscles to contract and keeps your heartbeat regular. High levels of potassium in the blood can cause irregular heartbeats and can be life threatening.  A low blood level can cause severe muscle weakness and cramps. The key to selecting foods with potassium is having the right balance for your needs. Your registered dietitian nutritionist (RDN) can help create a daily plan that's right for you.  Tips Check portion sizes closely. For example, a serving of dried raisins is  cup and a serving of fresh grapes is  cup. The Nutrition Facts label presents the amount of potassium per serving. Products labeled "low salt" or "low sodium" may have more potassium since potassium is used as a salt substitute. Check the ingredient list for sources of potassium: Potassium chloride, potassium sorbate, tetrapotassium phosphate, dipotassium phosphate.  Potassium in Foods A low-potassium food can become a high potassium food if you eat a large amount. A high-potassium food can be considered a low potassium food if you only eat a small amount of it.   Lower Potassium Choices Less than 200 milligrams per serving Higher Potassium Choices 200 milligrams or more per serving  Serving size Serving size of food is  cup, 1 small fruit, or 1 cup leafy green unless otherwise noted. Potassium levels may change if a food is fresh, cooked, or canned  Vegetables Asparagus Beans (green, wax, yellow) Bean sprouts Broccoli Cabbage (red or green) Carrots  Cauliflower Celery Corn Cucumbers Eggplant Greens (kale, mustard greens, endive, watercress) Peppers (green, red, orange) Kale Lettuce (all types) Leeks  Mushrooms Onions Peas (green, sugar snap, snow peas) Radishes (5) Summer squash Turnip Artichokes (1) Avocado Bamboo shoots (raw) Beets (canned,  fresh) Brussels sprouts (fresh, frozen) Chard (cooked) Chinese cabbage/bok choy (cooked) Corn (1 ear) Kohlrabi Potatoes (white, sweet, yams) Parsnips Pumpkin Rutabaga Spinach (cooked, canned, frozen) Squash (winter, acorn, butternut, rhubarb) Tomatoes (raw, boiled, canned, purees, sauces) Vegetable or tomato juice    Fruits Apple, applesauce Apricot (1) Berries: blackberries blueberries cranberries, raspberries, strawberries Cherries Clementine (1), mandarin orange, tangerine Dried apples, blueberries, cherries, cranberries ( cup) Fruit cup: any fruit, fruit cocktail Grapes Grapefruit () Lemon and limes Plum (1) Pear Watermelon (1 cup) Fruit juice: apple, cranberry, grape, pineapple Nectars: apricot, mango, papaya, peach, pear Bananas Dried fruit (such as apricots, dates, figs, prunes, raisins, currants)  cup Kiwi (1) Melon: Cantaloupe, honeydew Nectarine (1 medium) Orange (fresh, 1 medium) Papaya (1/2 medium) Peach Plantain Pomegranate Fruit juice: pomegranate, prune, orange, carrot    Grains Cereal, whole grain and plain (1cup)  Oatmeal (1 cup)  Bread, whole grain, raisin, white (1 slice) Pasta (whole grain and white) Rice (white or brown) Bran cereals Granola  Protein Foods Peanut butter (1 tablespoon)  Hummus ( cup)    Tofu (firm, soft, silken) Vegetarian patty (2 ounces, size of  palm of hand) Legumes/Pulses: Black, kidney, pinto or white beans, black-eyed peas, split peas, lentils, chickpeas, garbanzos Beef, fish, and poultry (3 ounces, size of palm of hand) Nuts ( cup) Soy: soy nuts , tofu (extra firm or lite)  Dairy and Alternatives Rice, almond, or oat dairy alternates Cream cheese (1 tablespoon) Natural cheese (blue, brie, cheddar, Swiss) (1 ounce) Sour cream (1 tablespoon) Milk (8 ounces)  Yogurt (fruit flavors, 8 ounces) Soy milk Cottage cheese (1 cup) Coconut milk  Beverages Lemonade (8 ounces) Tea (8  ounces, fresh brewed)   Coconut  water Low-sodium broths and soups (1 cup,  can,1 packet, bouillon cube) Bottled/instant tea  Copyright 2020  [employer]y [employer]etics. All rights reserved

## 2022-06-08 NOTE — Progress Notes (Signed)
[facility]  PROGRESS NOTE  Tammy [name] ZOX:[id number] DOB: 14-Mar-1955 DOA: 06/04/2022 PCP: Quitman Livings, MD   Brief HPI:   67 year old female with medical history of prior CVA, liver cirrhosis from hepatitis C, history of breast cancer on Arimidex, chronic hypokalemia admitted to hospital by Dr. Linna [name] for multistage operative repair of left distal femur wound.  She fell and broke her distal femur last year, was taken to the OR on 04/10/2021 by Dr. Carola Frost for ORIF, left distal femur with intercondylar extension.  She then underwent hardware removal and distal femur replacement on 04/30/2022.  She has not been readmitted by orthopedic surgery on 4/25 due to wound dehiscence of surgical site.  She was taken for debridement and washout with placement of wound VAC on 4/25.   Dr. Linna [name] plans larger reconstruction surgery with the assistance of plastic surgery during this hospital stay, requests hospitalist assistance for electrolyte management and to ensure patient readiness for surgery.   Subjective   Patient seen and examined, no new complaints.  Hemoglobin dropped this morning.  Likely dilutional from IV fluids and blood loss from surgery   Assessment/Plan:    Hypokalemia -Resolved -Patient has had chronically low potassium for past 3 years -In the last she was on hydrochlorothiazide for 15 years which she stopped 3 years ago -No other home medication likely causing hypokalemia -Serum magnesium 1.8 -Potassium has improved to 3.7 this morning  Hypertension -Blood pressure is stable -Currently she is on hydralazine 25 mg p.o. twice daily, patient's home dose along with amlodipine 10 mg daily, started yesterday -Patient states that she developed cough with lisinopril, has not tried ARB's in the past  Normocytic anemia -Chronic, unclear etiology -Likely anemia of chronic disease -S/p 2 unit PRBC, hemoglobin was 11.5 yesterday  -Dropped to 8.3 this morning, likely from  IV fluids as well as acute blood loss anemia from surgery -Patient is now eating well, change IV fluids to [facility] -Denies melena or hematochezia -Follow labs in the hospital -Transfuse for Hb less than 7  History of breast cancer -Continue Arimidex  Left distal femur wound repair -Wound VAC in place -Orthopedics following  Medications     amLODipine  10 mg Oral Daily   anastrozole  1 mg Oral Daily   vitamin C  1,000 mg Oral Daily   buPROPion  150 mg Oral BID   calcium carbonate  1 tablet Oral Q breakfast   celecoxib  200 mg Oral BID   cyanocobalamin  1,000 mcg Oral Daily   docusate sodium  100 mg Oral BID   enoxaparin (LOVENOX) injection  30 mg Subcutaneous Q12H   gabapentin  100 mg Oral BID   hydrALAZINE  25 mg Oral BID   multivitamin with minerals  1 tablet Oral Daily   pantoprazole  40 mg Oral Daily   potassium chloride  10 mEq Oral Daily   senna  1 tablet Oral BID   zinc sulfate  220 mg Oral Daily     Data Reviewed:   CBG:  No results for input(s): "GLUCAP" in the last 168 hours.  SpO2: 97 % O2 Flow Rate (L/min): 2 L/min    Vitals:   06/07/22 2153 06/08/22 0104 06/08/22 0518 06/08/22 0533  BP: (!) 153/72 136/66 (!) 157/68   Pulse: 70 75 63   Resp: 18 18 18    Temp: 98.3 F (36.8 C) 98.4 F (36.9 C) 98.3 F (36.8 C)   TempSrc:      SpO2: 100% 100% [name])  84% 97%  Weight:      Height:          Data Reviewed:  Basic Metabolic Panel: Recent Labs  Lab 06/05/22 0327 06/06/22 0324 06/06/22 0849 06/06/22 1016 06/06/22 1654 06/07/22 0324 06/08/22 0334  NA 137  --  139 137  --  138 137  K 2.8*  --  2.9* 2.9* 3.3* 3.7 3.7  CL 102  --  106 103  --  105 105  CO2 24  --  22 23  --  23 24  GLUCOSE 141*  --  85 88  --  94 121*  BUN 12  --  13 12  --  14 13  CREATININE 0.99 0.92 0.78 0.79  --  0.85 0.78  CALCIUM 8.1*  --  8.2* 8.3*  --  8.6* 8.5*  MG  --   --  1.8  --   --   --   --     CBC: Recent Labs  Lab 06/05/22 0327 06/05/22 1844  06/06/22 0324 06/06/22 1944 06/07/22 0324 06/08/22 0334  WBC 5.7  --  7.7  --  5.5 9.2  HGB 6.5* 8.6* 8.7* 11.2* 11.5* 8.3*  HCT 20.4* 26.6* 26.4* 33.8* 34.9* 25.8*  MCV 88.7  --  87.7  --  87.7 89.9  PLT 344  --  308  --  317 307    LFT Recent Labs  Lab 06/06/22 1016  AST 13*  ALT 9  ALKPHOS 54  BILITOT 0.6  PROT 6.3*  ALBUMIN 2.5*     Antibiotics: Anti-infectives (From admission, onward)    Start     Dose/Rate Route Frequency Ordered Stop   06/07/22 0730  ceFAZolin (ANCEF) IVPB 2g/100 mL premix        2 g 200 mL/hr over 30 Minutes Intravenous On call to O.R. 06/07/22 0631 06/07/22 0749   06/07/22 1610  ceFAZolin (ANCEF) 2-4 GM/100ML-% IVPB       Note to Pharmacy: Myrlene Broker M: cabinet override      06/07/22 0633 06/07/22 0825   06/05/22 0600  ceFAZolin (ANCEF) IVPB 2g/100 mL premix  Status:  Discontinued        2 g 200 mL/hr over 30 Minutes Intravenous On call to O.R. 06/04/22 1314 06/04/22 1317   06/05/22 0500  vancomycin (VANCOREADY) IVPB 1250 mg/250 mL        1,250 mg 166.7 mL/hr over 90 Minutes Intravenous Every 24 hours 06/04/22 1829     06/04/22 2000  piperacillin-tazobactam (ZOSYN) IVPB 3.375 g        3.375 g 12.5 mL/hr over 240 Minutes Intravenous Every 8 hours 06/04/22 1819     06/04/22 1900  ceFAZolin (ANCEF) IVPB 2g/100 mL premix  Status:  Discontinued        2 g 200 mL/hr over 30 Minutes Intravenous Every 6 hours 06/04/22 1802 06/04/22 1810   06/04/22 1330  vancomycin (VANCOCIN) IVPB 1000 mg/200 mL premix        1,000 mg 200 mL/hr over 60 Minutes Intravenous On call to O.R. 06/04/22 1314 06/04/22 1423   06/04/22 1330  ceFAZolin (ANCEF) IVPB 2g/100 mL premix        2 g 200 mL/hr over 30 Minutes Intravenous On call to O.R. 06/04/22 1317 06/04/22 1528           Objective    Physical Examination:  General-appears in no acute distress Heart-S1-S2, regular, no murmur auscultated Lungs-clear to auscultation bilaterally, no wheezing or  crackles auscultated  Abdomen-soft, nontender, no organomegaly Extremities-no edema in the lower extremities Neuro-alert, oriented x3, no focal deficit noted            Haely Leyland S Elleanor Guyett   [facility] If 7PM-7AM, please contact night-coverage at [email or web address], Office  [phone]   06/08/2022, 8:22 AM  LOS: 4 days

## 2022-06-08 NOTE — Progress Notes (Signed)
Physical Therapy Treatment Patient Details Name: Tammy [name] MRN: [id number] DOB: 25-Nov-1955 Today's Date: 06/08/2022   History of Present Illness 67 y.o. 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 She is s/p left distal femur ORIF fracture 04/10/21 with Dr. 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 s/p removal of implant and replacement of L distal femur 04/30/22. Now admitted 06/04/22 with necrotic wound s/p surgical debridement. And s/p repeat debridement with exchange of rotating hinge bearing 06/07/22.  PMH: bipolar, CVA.    PT Comments    Pt transferred from bed to bedside commode, then took a few pivotal steps from bedside commode to recliner with RW and L KI.  Increased time for all mobility 2* pain. Good progress expected, pt puts forth good effort.     Recommendations for follow up therapy are one component of a multi-disciplinary discharge planning process, led by the attending physician.  Recommendations may be updated based on patient status, additional functional criteria and insurance authorization.  Follow Up Recommendations       Assistance Recommended at Discharge Intermittent Supervision/Assistance  Patient can return home with the following A little help with bathing/dressing/bathroom;Assist for transportation;Help with stairs or ramp for entrance;Assistance with cooking/housework;A little help with walking and/or transfers   Equipment Recommendations  None recommended by PT    Recommendations for Other Services       Precautions / Restrictions Precautions Precautions: Fall;Other (comment) Precaution Comments: wound vac L knee; KI all times, no L knee flexion Required Braces or Orthoses: Knee Immobilizer  - Left Knee Immobilizer - Left: On at all times Restrictions Weight Bearing Restrictions: No LLE Weight Bearing: Weight bearing as tolerated     Mobility  Bed Mobility Overal bed mobility: Needs Assistance Bed Mobility: Supine to Sit     Supine to sit: HOB elevated, Min assist     General bed mobility comments: min A to support LLE, pt used gait belt as LLE lifter    Transfers Overall transfer level: Needs assistance Equipment used: Rolling walker (2 wheels) Transfers: Sit to/from Stand, Bed to chair/wheelchair/BSC Sit to Stand: Min guard Stand pivot transfers: Min guard Step pivot transfers: Min guard       General transfer comment: VCs hand placement, steady, no loss of balance, KI on LLE; pt pivoted bed to bedside commode, then took a few pivotal steps to recliner    Ambulation/Gait Ambulation/Gait assistance: Min guard Gait Distance (Feet): 4 Feet Assistive device: Rolling walker (2 wheels) Gait Pattern/deviations: Step-to pattern, Decreased stance time - left, Antalgic Gait velocity: decr     General Gait Details: KI on L knee, pt took a few pivotal steps from bedside commode to Engineer, production    Modified Rankin (Stroke Patients Only)       Balance Overall balance assessment: Needs assistance   Sitting balance-Leahy Scale: Good     Standing balance support: Bilateral upper extremity supported, During functional activity, Reliant on assistive device for balance Standing balance-Leahy Scale: Poor                              Cognition Arousal/Alertness:  Awake/alert Behavior During Therapy: WFL for tasks assessed/performed Overall Cognitive Status: Within Functional Limits for tasks assessed                                          Exercises Total Joint Exercises Ankle Circles/Pumps: AROM, Both, 10 reps, Supine    General Comments        Pertinent Vitals/Pain Pain  Assessment Pain Score: 5  Pain Location: L knee with activity Pain Descriptors / Indicators: Sore Pain Intervention(s): Limited activity within patient's tolerance, Monitored during session, Premedicated before session, Ice applied, Repositioned    Home Living                          Prior Function            PT Goals (current goals can now be found in the care plan section) Acute Rehab PT Goals Patient Stated Goal: go to [place], get married without using a walker PT Goal Formulation: With patient Time For Goal Achievement: 05/08/22 Potential to Achieve Goals: Good Progress towards PT goals: Progressing toward goals    Frequency    Min 1X/week      PT Plan Current plan remains appropriate    Co-evaluation              AM-PAC PT "6 Clicks" Mobility   Outcome Measure  Help needed turning from your back to your side while in a flat bed without using bedrails?: None Help needed moving from lying on your back to sitting on the side of a flat bed without using bedrails?: A Little Help needed moving to and from a bed to a chair (including a wheelchair)?: A Little Help needed standing up from a chair using your arms (e.g., wheelchair or bedside chair)?: A Little Help needed to walk in hospital room?: A Lot Help needed climbing 3-5 steps with a railing? : A Little 6 Click Score: 18    End of Session Equipment Utilized During Treatment: Gait belt Activity Tolerance: Patient limited by pain Patient left: in chair;with call bell/phone within reach;with chair alarm set;with family/visitor present Nurse Communication: Mobility status PT Visit Diagnosis: Difficulty in walking, not elsewhere classified (R26.2);Other abnormalities of gait and mobility (R26.89) Pain - Right/Left: Left Pain - part of body: Knee     Time: 4098-1191 PT Time Calculation (min) (ACUTE ONLY): 46 min  Charges:  $Gait Training: 8-22 mins $Therapeutic Exercise: 8-22 mins $Therapeutic  Activity: 8-22 mins                     Ralene Bathe Kistler PT 06/08/2022  Acute Rehabilitation Services  Office [phone]

## 2022-06-08 NOTE — Progress Notes (Signed)
    Subjective:  Patient reports pain as moderate.  Denies N/V/CP/SOB/Abd pain. She denies any tingling or numbness in LE bilaterally. Calves soft and non-tender. She reports some difficulty with pain overnight but states she is feeling better now. Will continue to monitor pain and adjust medications as necessary.   Objective:   VITALS:   Vitals:   06/07/22 2153 06/08/22 0104 06/08/22 0518 06/08/22 0533  BP: (!) 153/72 136/66 (!) 157/68   Pulse: 70 75 63   Resp: 18 18 18    Temp: 98.3 F (36.8 C) 98.4 F (36.9 C) 98.3 F (36.8 C)   TempSrc:      SpO2: 100% 100% (!) 84% 97%  Weight:      Height:        Patient sitting up in bed. NAD.  Neurologically intact ABD soft Neurovascular intact Sensation intact distally Intact pulses distally Dorsiflexion/Plantar flexion intact No cellulitis present Compartment soft Negative pressure dressing C/D/I. No leaks detected. Wound vac . 100 cc SS output.  Hemovac drain dressing C/D/I. 60 cc bloody fluid. Continue drain.  Knee immobilizer on.   Lab Results  Component Value Date   WBC 9.2 06/08/2022   HGB 8.3 (L) 06/08/2022   HCT 25.8 (L) 06/08/2022   MCV 89.9 06/08/2022   PLT 307 06/08/2022   BMET    Component Value Date/Time   NA 137 06/08/2022 0334   K 3.7 06/08/2022 0334   CL 105 06/08/2022 0334   CO2 24 06/08/2022 0334   GLUCOSE 121 (H) 06/08/2022 0334   BUN 13 06/08/2022 0334   CREATININE 0.78 06/08/2022 0334   CREATININE 0.76 07/10/2020 1220   CALCIUM 8.5 (L) 06/08/2022 0334   GFRNONAA >60 06/08/2022 0334   GFRNONAA >60 07/10/2020 1220     Assessment/Plan: 1 Day Post-Op   Principal Problem:   Surgical wound dehiscence Active Problems:   HTN (hypertension)   Bipolar disorder, unspecified (HCC)   History of CVA (cerebrovascular accident)   Hypokalemia   Liver cirrhosis (HCC)  ABLA. Hemoglobin 8.3 this morning. Continue to monitor.    Hypokalemia. Potassium 3.7 this morning. TRH monitoring.    Weight-bear as tolerated with walker. Knee immobilizer at all times; do not bend the knee.   DVT ppx: Lovenox, SCDs, TEDS PO pain control Dispo:  - Nutrition consult placed. [facility] consulting for medical management of patient. Appreciate their assistance.  - Continue wound vac and hemovac drain. - Dr. Ulice Bold with plastic surgery, plans to take the patient back to the OR this upcoming week.   - Continue to follow intraoperative cultures. Broad-spectrum IV antibiotics for now. Once we have the final culture and susceptibilities, we will place an infectious disease consult for IV selection and duration.  She will need a PICC line for long-term IV antibiotics.   Clois Dupes, PA-C 06/08/2022, 12:42 PM   [facility]  [place] [address], [address], [place], [place] [address] Phone: [phone] [email or web address] Facebook  [employer]

## 2022-06-08 NOTE — Care Management Important Message (Signed)
Important Message  Patient Details IM Letter given to the Patient Name: Tammy [name] MRN: [id number] Date of Birth: 11-Mar-1955   Medicare Important Message Given:  Yes     Caren Macadam 06/08/2022, 2:41 PM

## 2022-06-08 NOTE — Progress Notes (Signed)
Initial Nutrition Assessment  INTERVENTION:   -1 packet Juven BID, each packet provides 95 calories, 2.5 grams of protein (collagen), and 9.8 grams of carbohydrate (3 grams sugar); also contains 7 grams of L-arginine and L-glutamine, 300 mg vitamin C, 15 mg vitamin E, 1.2 mcg vitamin B-12, 9.5 mg zinc, 200 mg calcium, and 1.5 g  Calcium Beta-hydroxy-Beta-methylbutyrate to support wound healing   -Placed "Potassium Content of Foods" handout in AVS   NUTRITION DIAGNOSIS:   Increased nutrient needs related to post-op healing as evidenced by estimated needs.  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 [name] for multistage operative repair of left distal femur wound.  She fell and broke her distal femur last year, was taken to the OR on 04/10/2021 by Dr. Carola Frost for ORIF, left distal femur with intercondylar extension.  She then underwent hardware removal and distal femur replacement on 04/30/2022.  She has not been readmitted by orthopedic surgery on 4/25 due to wound dehiscence of surgical site.  She was taken for debridement and washout with placement of wound VAC on 4/25.  Patient in room, family at bedside. Pt reports she eats pretty well, may not have meats with every meal but does consume fish and eggs frequently. Sometimes eats chicken but very rarely beef. Pt takes multiple vitamin supplements at home: Vitamin D, C, Zinc, calcium, MVI and Vitamin B-12. She is receiving these this admission as well. Pt consuming 100% of meals at this time. She is concerned about her potassium levels, will provide potassium food list for pt to review. Pt agreeable  to receiving Juven to aid in wound healing as well.   Per weight records, pt's weight has remained stable.   Medications: Vitamin C, OSCAL, Vitamin B-12, Colace, Multivitamin with minerals daily, KLOR-CON, Senokot, Zinc sulfate  Labs reviewed.  NUTRITION - FOCUSED PHYSICAL EXAM:  No depletions noted.  Diet Order:   Diet Order             Diet Heart Room service appropriate? Yes; Fluid consistency: Thin  Diet effective now                   EDUCATION NEEDS:   Education needs have been addressed  Skin:  Skin Assessment: Skin Integrity Issues: Skin Integrity Issues:: Incisions Incisions: 4/25 left knee, 4/28 left knee  Last BM:  4/26-type 4  Height:   Ht Readings from Last 1 Encounters:  06/04/22 5\' 2"  (1.575 m)    Weight:   Wt Readings from Last 1 Encounters:  06/04/22 76 kg    BMI:  Body mass index is 30.65 kg/m.  Estimated Nutritional Needs:   Kcal:  1400-1600  Protein:  70-85g  Fluid:  1.6L/day  Tilda Franco, MS, RD, LDN Inpatient Clinical Dietitian Contact information available via Amion

## 2022-06-09 ENCOUNTER — Telehealth: Payer: Self-pay

## 2022-06-09 DIAGNOSIS — I10 Essential (primary) hypertension: Secondary | ICD-10-CM | POA: Diagnosis not present

## 2022-06-09 DIAGNOSIS — E876 Hypokalemia: Secondary | ICD-10-CM | POA: Diagnosis not present

## 2022-06-09 DIAGNOSIS — B9689 Other specified bacterial agents as the cause of diseases classified elsewhere: Secondary | ICD-10-CM

## 2022-06-09 DIAGNOSIS — T847XXA Infection and inflammatory reaction due to other internal orthopedic prosthetic devices, implants and grafts, initial encounter: Secondary | ICD-10-CM

## 2022-06-09 LAB — AEROBIC/ANAEROBIC CULTURE W GRAM STAIN (SURGICAL/DEEP WOUND)

## 2022-06-09 LAB — BASIC METABOLIC PANEL
Anion gap: 7 (ref 5–15)
BUN: 15 mg/dL (ref 8–23)
CO2: 25 mmol/L (ref 22–32)
Calcium: 8.2 mg/dL — ABNORMAL LOW (ref 8.9–10.3)
Chloride: 106 mmol/L (ref 98–111)
Creatinine, Ser: 0.66 mg/dL (ref 0.44–1.00)
GFR, Estimated: >60 mL/min (ref >60)
Glucose, Bld: 114 mg/dL — ABNORMAL HIGH (ref 70–99)
Potassium: 3.4 mmol/L — ABNORMAL LOW (ref 3.5–5.1)
Sodium: 138 mmol/L (ref 135–145)

## 2022-06-09 LAB — CBC
HCT: 24.7 % — ABNORMAL LOW (ref 36.0–46.0)
Hemoglobin: 7.9 g/dL — ABNORMAL LOW (ref 12.0–15.0)
MCH: 28.9 pg (ref 26.0–34.0)
MCHC: 32 g/dL (ref 30.0–36.0)
MCV: 90.5 fL (ref 80.0–100.0)
Platelets: 282 10*3/uL (ref 150–400)
RBC: 2.73 MIL/uL — ABNORMAL LOW (ref 3.87–5.11)
RDW: 15.9 % — ABNORMAL HIGH (ref 11.5–15.5)
WBC: 6.3 10*3/uL (ref 4.0–10.5)
nRBC: 0 % (ref 0.0–0.2)

## 2022-06-09 MED ORDER — OXYCODONE HCL 5 MG PO TABS
5.0000 mg | ORAL_TABLET | ORAL | Status: DC | PRN
  Administered 2022-06-09: 5 mg via ORAL
  Administered 2022-06-11 – 2022-06-12 (×7): 10 mg via ORAL
  Administered 2022-06-12: 5 mg via ORAL
  Administered 2022-06-13 – 2022-06-14 (×3): 10 mg via ORAL
  Filled 2022-06-09 (×6): qty 2
  Filled 2022-06-09: qty 1
  Filled 2022-06-09 (×3): qty 2

## 2022-06-09 MED ORDER — HYDROMORPHONE HCL 1 MG/ML IJ SOLN
0.5000 mg | INTRAMUSCULAR | Status: DC | PRN
  Administered 2022-06-09 – 2022-06-17 (×5): 0.5 mg via INTRAVENOUS
  Filled 2022-06-09 (×5): qty 0.5

## 2022-06-09 MED ORDER — OXYCODONE HCL 5 MG PO TABS
10.0000 mg | ORAL_TABLET | ORAL | Status: DC | PRN
  Administered 2022-06-09 – 2022-06-17 (×23): 15 mg via ORAL
  Administered 2022-06-18: 10 mg via ORAL
  Administered 2022-06-18 (×2): 15 mg via ORAL
  Filled 2022-06-09 (×12): qty 3
  Filled 2022-06-09: qty 2
  Filled 2022-06-09 (×10): qty 3
  Filled 2022-06-09: qty 2
  Filled 2022-06-09 (×7): qty 3

## 2022-06-09 MED ORDER — POTASSIUM CHLORIDE CRYS ER 20 MEQ PO TBCR
40.0000 meq | EXTENDED_RELEASE_TABLET | Freq: Once | ORAL | Status: AC
  Administered 2022-06-09: 40 meq via ORAL
  Filled 2022-06-09: qty 2

## 2022-06-09 MED ORDER — SODIUM CHLORIDE 0.9 % IV SOLN
3.0000 g | Freq: Four times a day (QID) | INTRAVENOUS | Status: DC
  Administered 2022-06-09 – 2022-06-11 (×7): 3 g via INTRAVENOUS
  Filled 2022-06-09 (×8): qty 8

## 2022-06-09 NOTE — Consult Note (Signed)
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 Likely will need long term antibiotics suppression   Plan: Follow up final 4/25 culture Change abx to amp-sulbactam for now Anticipate 6 weeks iv abx and then transitioning to amoxicillin chronic suppression.  Discussed with primary team     I spent more than 80 minute reviewing data/chart, and coordinating care, providing direct face to face time providing counseling/discussing diagnostics/treatment plan with patient and treatment team       ------------------------------------------------ Principal Problem:   Surgical wound dehiscence Active Problems:   HTN (hypertension)   Bipolar disorder, unspecified (HCC)   History of CVA (cerebrovascular accident)   Hypokalemia   Liver cirrhosis (HCC)    HPI: Tammy [name] is a 67 y.o. female admitted for surgical site infection of left distal femoral reconstruction, complicated by hardware presence/involvement   Patient developed wound dehiscence and discharge/pain at the surgical site several weeks from 04/30/22 revision surgery.  She had received 3 days of cefadroxil prior to this admission for concern of cellulitis at surgical site  Surgical timeline: 04/2021 orif left distal femur and intercondyler extension for femoral fracture 04/30/22 removal of deep implant left femur; new implant of biomet orthopedics salvage system. No purulence described 4/25 I&D SQ tissue and left knee joint; wound vac placement. Necrotic area and serous fluid noted tracking into SQ tissue. Also purulent material found and tunneling beneath the incision. No purulent material in the knee joint 4/28  I&D SQ tissue of left knee and exchange of rotating hinge bearing  She also received periop cefazolin and empiric vanc/piptazo since admission  There was swab culture on 04/30/22 OR which was  negative. The 4/25 operative cultures did grow e faecalis in both samples   She denies fever chill   She is doing well from most recent surgery    Family History  Problem Relation Age of Onset   Other Mother        blood clots   Diabetes Sister    Neuropathy Sister        diabetic neuropathy   Diabetes Brother    Neuropathy Brother        diabetic neuropathy   Breast cancer Maternal Aunt        68   Colon cancer Maternal Aunt 23   Diabetes Paternal Aunt    Breast cancer Other        PGM's sister; dx mid 23s   Colon cancer Other        PGM's brother; dx 59s   Ovarian cancer Other        PGM's mother; dx unknown age   Esophageal cancer Neg  Hx    Rectal cancer Neg Hx    Stomach cancer Neg Hx    Colon polyps Neg Hx     Social History   Tobacco Use   Smoking status: Former    Packs/day: 1.50    Years: 40.00    Additional pack years: 0.00    Total pack years: 60.00    Types: Cigarettes   Smokeless tobacco: Never   Tobacco comments:    has used e-cigarettes, no vaping   Vaping Use   Vaping Use: Never used  Substance Use Topics   Alcohol use: Not Currently   Drug use: No    Allergies  Allergen Reactions   Lisinopril Cough     Review of Systems: ROS All Other ROS was negative, except mentioned above   Past Medical History:  Diagnosis Date   Anemia    hx of   Anxiety    on meds   Arthritis    back/bilateral ankles   Bipolar disorder (HCC)    Breast cancer (HCC) 2022   LEFT breast-radiation   Depression    on meds   Family history of breast cancer 07/10/2020   Family history of colon cancer 07/10/2020   Family history of ovarian cancer 07/10/2020   GERD (gastroesophageal reflux disease)    on meds   Heart murmur    dx by PCP   Hepatitis C    HTN (hypertension)    on meds   Hx of radiation therapy    Liver cirrhosis (HCC)    from hep c   Neuromuscular disorder (HCC)    Osteopenia    Stroke (HCC) 09/2012   slurred speech and LEFT sided weakness/notes 10/04/2012       Scheduled Meds:  amLODipine  10 mg Oral Daily   anastrozole  1 mg Oral Daily   vitamin C  1,000 mg Oral Daily   buPROPion  150 mg Oral BID   calcium carbonate  1 tablet Oral Q breakfast   celecoxib  200 mg Oral BID   cyanocobalamin  1,000 mcg Oral Daily   docusate sodium  100 mg Oral BID   enoxaparin (LOVENOX) injection  30 mg Subcutaneous Q12H   gabapentin  100 mg Oral BID   hydrALAZINE  25 mg Oral BID   multivitamin with minerals  1 tablet Oral Daily   nutrition supplement (JUVEN)  1 packet Oral BID BM   pantoprazole  40 mg Oral Daily   potassium chloride  10 mEq Oral Daily   potassium chloride  40 mEq Oral Once   senna  1 tablet Oral BID   zinc sulfate  220 mg Oral Daily   Continuous Infusions:  sodium chloride 10 mL/hr at 06/08/22 0854   methocarbamol (ROBAXIN) IV     piperacillin-tazobactam (ZOSYN)  IV 3.375 g (06/09/22 1129)   vancomycin 1,250 mg (06/09/22 0513)   PRN Meds:.acetaminophen, alum & mag hydroxide-simeth, diphenhydrAMINE, hydrALAZINE, HYDROmorphone (DILAUDID) injection, menthol-cetylpyridinium **OR** phenol, methocarbamol **OR** methocarbamol (ROBAXIN) IV, metoCLOPramide **OR** metoCLOPramide  (REGLAN) injection, ondansetron **OR** ondansetron (ZOFRAN) IV, oxyCODONE, oxyCODONE, polyethylene glycol   OBJECTIVE: Blood pressure (!) 158/58, pulse 64, temperature 98 F (36.7 C), temperature source Oral, resp. rate 18, height 5\' 2"  (1.575 m), weight 76 kg, SpO2 99 %.  Physical Exam  General/constitutional: no distress, pleasant HEENT: Normocephalic, PER, Conj Clear, EOMI, Oropharynx clear Neck supple CV: rrr no mrg Lungs: clear to auscultation, normal respiratory effort Abd: Soft, Nontender Ext: no edema Skin: No Rash Neuro: nonfocal  MSK: left LE in brace   Lab Results Lab Results  Component Value Date   WBC 6.3 06/09/2022   HGB 7.9 (L) 06/09/2022   HCT 24.7 (L) 06/09/2022   MCV 90.5 06/09/2022   PLT 282 06/09/2022    Lab Results  Component Value Date   CREATININE 0.66 06/09/2022   BUN 15 06/09/2022   NA 138 06/09/2022   K 3.4 (L) 06/09/2022   CL 106 06/09/2022   CO2 25 06/09/2022    Lab Results  Component Value Date   ALT 9 06/06/2022   AST 13 (L) 06/06/2022   ALKPHOS 54 06/06/2022   BILITOT 0.6 06/06/2022      Microbiology: Recent Results (from the past 240 hour(s))  Aerobic/Anaerobic Culture w Gram Stain (surgical/deep wound)     Status: None (Preliminary result)   Collection Time: 06/04/22  3:38 PM   Specimen: Synovial, Left Knee; Body Fluid  Result Value Ref Range Status   Specimen Description   Final    WOUND Performed at [facility], [address] [place], [place] [address]    Special Requests   Final    LEFT KNEE Performed at [facility], [address] [place], [place] [address]    Gram Stain   Final    ABUNDANT WBC PRESENT, PREDOMINANTLY PMN NO ORGANISMS SEEN Performed at [facility], [address] [place], [place] [address]    Culture   Final    RARE ENTEROCOCCUS FAECALIS NO ANAEROBES ISOLATED; CULTURE IN PROGRESS FOR 5 DAYS    Report Status PENDING  Incomplete   Organism ID,  Bacteria ENTEROCOCCUS FAECALIS  Final      Susceptibility   Enterococcus faecalis - MIC*    AMPICILLIN <=2 SENSITIVE Sensitive     VANCOMYCIN 1 SENSITIVE Sensitive     GENTAMICIN SYNERGY SENSITIVE Sensitive     * RARE ENTEROCOCCUS FAECALIS  Aerobic/Anaerobic Culture w Gram Stain (surgical/deep wound)     Status: None (Preliminary result)   Collection Time: 06/04/22  3:50 PM   Specimen: Synovial, Left Knee; Body Fluid  Result Value Ref Range Status   Specimen Description   Final    WOUND Performed at [facility], [address] [place], [place] [address]    Special Requests   Final    LEFT KNEE Performed at [facility], [address], [place], [place] [address]    Gram Stain   Final    FEW WBC PRESENT, PREDOMINANTLY PMN NO ORGANISMS SEEN Performed at [facility], [address], [place], [place] [address]    Culture   Final    RARE ENTEROCOCCUS FAECALIS SUSCEPTIBILITIES PERFORMED ON PREVIOUS CULTURE WITHIN THE LAST 5 DAYS. NO ANAEROBES ISOLATED; CULTURE IN PROGRESS FOR 5 DAYS    Report Status PENDING  Incomplete  Aerobic/Anaerobic Culture w Gram Stain (surgical/deep wound)     Status: None (Preliminary result)   Collection Time: 06/07/22  8:59 AM   Specimen: PATH Soft tissue  Result Value Ref Range Status   Specimen Description   Final    TISSUE Performed at [facility], [address], [place], [place] [address]    Special Requests   Final    LEFT KNEE Performed at [facility], [address], [place], [place] [address]    Gram Stain   Final    MODERATE WBC PRESENT, PREDOMINANTLY PMN NO ORGANISMS SEEN    Culture   Final  CULTURE REINCUBATED FOR BETTER GROWTH Performed at [facility], [address], [place], [place] [address]    Report Status PENDING  Incomplete     Serology:    Imaging: If present, new imagings (plain films, ct scans, and mri) have been  personally visualized and interpreted; radiology reports have been reviewed. Decision making incorporated into the Impression / Recommendations.  04/30/22 xray left femur Changes of left knee replacement without visible complicating feature.  03/17/22 xray left femur FINDINGS: Lateral plate and screw fixation of the distal femur again identified. Again identified is fracture of the plate approximately 6 cm proximal to the knee joint. Similar angulation to on the 02/18/2022 CT. The femoral fracture is also angulated at this level.   There is healing across the fracture site most apparent on the AP view.   Disuse osteopenia.   IMPRESSION: Similar appearance of hardware fracture as detailed above.   Interval partial healing of comminuted distal femur fracture since 02/18/2022 CT.       Raymondo Band, MD [facility] [phone] pager    06/09/2022, 12:52 PM

## 2022-06-09 NOTE — Progress Notes (Signed)
Physical Therapy Treatment Patient Details Name: Tammy [name] MRN: [id number] DOB: 11-01-1955 Today's Date: 06/09/2022   History of Present Illness 67 y.o. 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 She is s/p left distal femur ORIF fracture 04/10/21 with Dr. 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 s/p removal of implant and replacement of L distal femur 04/30/22. Now admitted 06/04/22 with necrotic wound s/p surgical debridement. And s/p repeat debridement with exchange of rotating hinge bearing 06/07/22.  PMH: bipolar, CVA.    PT Comments    POD #2 Assisted pt to transfer from supine to sit EOB, pt said that her pain "felt better when up walking", stated it was currently a 3/10. Assisted pt to transfer to [facility] where pt voided. Educated pt on weight bearing status and assisted pt to amb in hallway for 15 ft, then amb back to room to assist to transfer to recliner. Assisted pt to go through ankles pumps, quad sets, hip ab/adduction, and SLRs. Addressed all mobility questions, discussed appropriate activity, educated on use of ICE. Pt is progressing with mobility with an increase in distance amb and decreased bouts of fatigue.    Recommendations for follow up therapy are one component of a multi-disciplinary discharge planning process, led by the attending physician.  Recommendations may be updated based on patient status, additional functional criteria and insurance authorization.  Follow Up Recommendations       Assistance Recommended at Discharge Intermittent Supervision/Assistance  Patient can return home with the following A little help with bathing/dressing/bathroom;Assist for transportation;Help with stairs or ramp for  entrance;Assistance with cooking/housework;A little help with walking and/or transfers   Equipment Recommendations  None recommended by PT    Recommendations for Other Services       Precautions / Restrictions Precautions Precautions: Fall;Other (comment) Precaution Comments: wound vac L knee; KI all times, no L knee flexion Required Braces or Orthoses: Knee Immobilizer - Left Knee Immobilizer - Left: On at all times Restrictions Weight Bearing Restrictions: No LLE Weight Bearing: Weight bearing as tolerated     Mobility  Bed Mobility Overal bed mobility: Needs Assistance Bed Mobility: Supine to Sit     Supine to sit: HOB elevated, Min guard     General bed mobility comments: pt used gait belt as LLE lifter    Transfers Overall transfer level: Needs assistance Equipment used: Rolling walker (2 wheels) Transfers: Sit to/from Stand, Bed to chair/wheelchair/BSC Sit to Stand: Min guard   Step pivot transfers: Min guard       General transfer comment: VCs hand placement, steady, no loss of balance, KI on LLE; pt pivoted bed to bedside commode, then took a few pivotal steps to recliner    Ambulation/Gait Ambulation/Gait assistance: Min guard Gait Distance (Feet): 15 Feet Assistive device: Rolling walker (2 wheels) Gait Pattern/deviations: Step-to pattern, Decreased stance time - left, Antalgic Gait velocity: decr     General Gait Details: KI on L knee   Stairs             Wheelchair Mobility    Modified Rankin (Stroke Patients Only)       Balance  Cognition Arousal/Alertness: Awake/alert Behavior During Therapy: WFL for tasks assessed/performed Overall Cognitive Status: Within Functional Limits for tasks assessed                                          Exercises Total Joint Exercises Ankle Circles/Pumps: AROM, Both, 10 reps, Seated Quad Sets: AROM, Both, 5 reps,  Seated Hip ABduction/ADduction: AAROM, Left, 5 reps, Seated Straight Leg Raises: AAROM, Left, 5 reps, Seated    General Comments        Pertinent Vitals/Pain Pain Assessment Pain Score: 3  Pain Location: L knee with rest, "feels better when walking" Pain Descriptors / Indicators: Sore, Sharp Pain Intervention(s): Monitored during session, Repositioned    Home Living                          Prior Function            PT Goals (current goals can now be found in the care plan section) Acute Rehab PT Goals Patient Stated Goal: go to [place], get married without using a walker PT Goal Formulation: With patient Time For Goal Achievement: 05/08/22 Potential to Achieve Goals: Good Progress towards PT goals: Progressing toward goals    Frequency    Min 1X/week      PT Plan Current plan remains appropriate    Co-evaluation              AM-PAC PT "6 Clicks" Mobility   Outcome Measure  Help needed turning from your back to your side while in a flat bed without using bedrails?: None Help needed moving from lying on your back to sitting on the side of a flat bed without using bedrails?: A Little Help needed moving to and from a bed to a chair (including a wheelchair)?: A Little Help needed standing up from a chair using your arms (e.g., wheelchair or bedside chair)?: A Little Help needed to walk in hospital room?: A Lot Help needed climbing 3-5 steps with a railing? : A Little 6 Click Score: 18    End of Session Equipment Utilized During Treatment: Gait belt Activity Tolerance: Patient tolerated treatment well Patient left: in chair;with call bell/phone within reach;with chair alarm set Nurse Communication: Mobility status PT Visit Diagnosis: Difficulty in walking, not elsewhere classified (R26.2);Other abnormalities of gait and mobility (R26.89) Pain - Right/Left: Left Pain - part of body: Knee     Time: 1610-9604 PT Time Calculation (min) (ACUTE  ONLY): 27 min  Charges:  $Gait Training: 8-22 mins $Therapeutic Exercise: 8-22 mins                     Sharlene Motts, [place]

## 2022-06-09 NOTE — Progress Notes (Signed)
[facility]  PROGRESS NOTE  Tammy [name] WJX:[id number] DOB: 1955-06-06 DOA: 06/04/2022 PCP: Quitman Livings, MD   Brief HPI:   67 year old female with medical history of prior CVA, liver cirrhosis from hepatitis C, history of breast cancer on Arimidex, chronic hypokalemia admitted to hospital by Dr. [name] for multistage operative repair of left distal femur wound.  She fell and broke her distal femur last year, was taken to the OR on 04/10/2021 by Dr. Carola Frost for ORIF, left distal femur with intercondylar extension.  She then underwent hardware removal and distal femur replacement on 04/30/2022.  She has not been readmitted by orthopedic surgery on 4/25 due to wound dehiscence of surgical site.  She was taken for debridement and washout with placement of wound VAC on 4/25.   Dr. [name] plans larger reconstruction surgery with the assistance of plastic surgery during this hospital stay, requests hospitalist assistance for electrolyte management and to ensure patient readiness for surgery.   Subjective   Patient seen and examined, denies shortness of breath.  Potassium again dropped to 3.4 this morning.   Assessment/Plan:    Hypokalemia -Resolved -Patient has had chronically low potassium for past 3 years -In the last she was on hydrochlorothiazide for 15 years which she stopped 3 years ago -No other home medication likely causing hypokalemia -Serum magnesium 1.8 -Potassium is 3.4 this morning. -Replace potassium and follow BMP in am  Hypertension -Blood pressure is stable -Currently she is on hydralazine 25 mg p.o. twice daily, patient'[name] home dose along with amlodipine 10 mg daily, started yesterday -Patient states that she developed cough with lisinopril, has not tried ARB'[name] in the past -Patient with hypertension and hypokalemia; suspicious for primary hyperaldosteronism.  Will check plasma renin activity  and aldosterone/renin ratio  Normocytic anemia -Chronic,  unclear etiology -Likely anemia of chronic disease -[name]/p 2 unit PRBC, hemoglobin was 11.5 yesterday  -Dropped to 7.9  this morning, likely from IV fluids as well as acute blood loss anemia from surgery -Patient is now eating well, IV fluids changed to [facility] -Denies melena or hematochezia -Follow labs in the hospital -Transfuse for Hb less than 7  History of breast cancer -Continue Arimidex  Left distal femur wound repair -Wound VAC in place -Orthopedics following  Medications     amLODipine  10 mg Oral Daily   anastrozole  1 mg Oral Daily   vitamin C  1,000 mg Oral Daily   buPROPion  150 mg Oral BID   calcium carbonate  1 tablet Oral Q breakfast   celecoxib  200 mg Oral BID   cyanocobalamin  1,000 mcg Oral Daily   docusate sodium  100 mg Oral BID   enoxaparin (LOVENOX) injection  30 mg Subcutaneous Q12H   gabapentin  100 mg Oral BID   hydrALAZINE  25 mg Oral BID   multivitamin with minerals  1 tablet Oral Daily   nutrition supplement (JUVEN)  1 packet Oral BID BM   pantoprazole  40 mg Oral Daily   potassium chloride  10 mEq Oral Daily   senna  1 tablet Oral BID   zinc sulfate  220 mg Oral Daily     Data Reviewed:   CBG:  No results for input([name]): "GLUCAP" in the last 168 hours.  SpO2: 99 % O2 Flow Rate (L/min): 2 L/min    Vitals:   06/08/22 0533 06/08/22 1321 06/08/22 2121 06/09/22 0511  BP:  (!) 165/81 (!) 145/67 (!) 158/58  Pulse:  70 64 64  Resp:  18 18 18   Temp:  98.9 F (37.2 C) 98.5 F (36.9 C) 98 F (36.7 C)  TempSrc:  Oral Oral Oral  SpO2: 97% 96% 97% 99%  Weight:      Height:          Data Reviewed:  Basic Metabolic Panel: Recent Labs  Lab 06/06/22 0849 06/06/22 1016 06/06/22 1654 06/07/22 0324 06/08/22 0334 06/09/22 0334  NA 139 137  --  138 137 138  K 2.9* 2.9* 3.3* 3.7 3.7 3.4*  CL 106 103  --  105 105 106  CO2 22 23  --  23 24 25   GLUCOSE 85 88  --  94 121* 114*  BUN 13 12  --  14 13 15   CREATININE 0.78 0.79  --  0.85 0.78  0.66  CALCIUM 8.2* 8.3*  --  8.6* 8.5* 8.2*  MG 1.8  --   --   --   --   --     CBC: Recent Labs  Lab 06/05/22 0327 06/05/22 1844 06/06/22 0324 06/06/22 1944 06/07/22 0324 06/08/22 0334 06/09/22 0334  WBC 5.7  --  7.7  --  5.5 9.2 6.3  HGB 6.5*   < > 8.7* 11.2* 11.5* 8.3* 7.9*  HCT 20.4*   < > 26.4* 33.8* 34.9* 25.8* 24.7*  MCV 88.7  --  87.7  --  87.7 89.9 90.5  PLT 344  --  308  --  317 307 282   < > = values in this interval not displayed.    LFT Recent Labs  Lab 06/06/22 1016  AST 13*  ALT 9  ALKPHOS 54  BILITOT 0.6  PROT 6.3*  ALBUMIN 2.5*     Antibiotics: Anti-infectives (From admission, onward)    Start     Dose/Rate Route Frequency Ordered Stop   06/07/22 0730  ceFAZolin (ANCEF) IVPB 2g/100 mL premix        2 g 200 mL/hr over 30 Minutes Intravenous On call to O.R. 06/07/22 0631 06/07/22 0749   06/07/22 4098  ceFAZolin (ANCEF) 2-4 GM/100ML-% IVPB       Note to Pharmacy: Myrlene Broker M: cabinet override      06/07/22 0633 06/07/22 0825   06/05/22 0600  ceFAZolin (ANCEF) IVPB 2g/100 mL premix  Status:  Discontinued        2 g 200 mL/hr over 30 Minutes Intravenous On call to O.R. 06/04/22 1314 06/04/22 1317   06/05/22 0500  vancomycin (VANCOREADY) IVPB 1250 mg/250 mL        1,250 mg 166.7 mL/hr over 90 Minutes Intravenous Every 24 hours 06/04/22 1829     06/04/22 2000  piperacillin-tazobactam (ZOSYN) IVPB 3.375 g        3.375 g 12.5 mL/hr over 240 Minutes Intravenous Every 8 hours 06/04/22 1819     06/04/22 1900  ceFAZolin (ANCEF) IVPB 2g/100 mL premix  Status:  Discontinued        2 g 200 mL/hr over 30 Minutes Intravenous Every 6 hours 06/04/22 1802 06/04/22 1810   06/04/22 1330  vancomycin (VANCOCIN) IVPB 1000 mg/200 mL premix        1,000 mg 200 mL/hr over 60 Minutes Intravenous On call to O.R. 06/04/22 1314 06/04/22 1423   06/04/22 1330  ceFAZolin (ANCEF) IVPB 2g/100 mL premix        2 g 200 mL/hr over 30 Minutes Intravenous On call to O.R.  06/04/22 1317 06/04/22 1528           Objective  Physical Examination:  Appears in no acute distress S1-S2, regular, no murmur auscultated Lungs clear to auscultation bilaterally Abdomen is soft, nontender, no organomegaly           Tammy [name] Tammy Jarrard   [facility] If 7PM-7AM, please contact night-coverage at [email or web address], Office  [phone]   06/09/2022, 10:00 AM  LOS: 5 days

## 2022-06-09 NOTE — Progress Notes (Signed)
Subjective:  Patient reports pain as moderate.  Denies N/V/CP/SOB/Abd pain. She denies any tingling or numbness in LE bilaterally. Calves soft and non-tender. She reports some difficulty with pain with PT yesterday. We discussed adjusting pain medications today.   Objective:   VITALS:   Vitals:   06/08/22 0533 06/08/22 1321 06/08/22 2121 06/09/22 0511  BP:  (!) 165/81 (!) 145/67 (!) 158/58  Pulse:  70 64 64  Resp:  18 18 18   Temp:  98.9 F (37.2 C) 98.5 F (36.9 C) 98 F (36.7 C)  TempSrc:  Oral Oral Oral  SpO2: 97% 96% 97% 99%  Weight:      Height:        Patient sitting up in bed. NAD.  Neurologically intact ABD soft Neurovascular intact Sensation intact distally Intact pulses distally Dorsiflexion/Plantar flexion intact No cellulitis present Compartment soft Negative pressure dressing C/D/I. No leaks detected. Wound vac . 100 cc SS output, 200 total today.  Hemovac drain dressing C/D/I. 15 cc bloody fluid. Continue drain.  Knee immobilizer on.   Lab Results  Component Value Date   WBC 6.3 06/09/2022   HGB 7.9 (L) 06/09/2022   HCT 24.7 (L) 06/09/2022   MCV 90.5 06/09/2022   PLT 282 06/09/2022   BMET    Component Value Date/Time   NA 138 06/09/2022 0334   K 3.4 (L) 06/09/2022 0334   CL 106 06/09/2022 0334   CO2 25 06/09/2022 0334   GLUCOSE 114 (H) 06/09/2022 0334   BUN 15 06/09/2022 0334   CREATININE 0.66 06/09/2022 0334   CREATININE 0.76 07/10/2020 1220   CALCIUM 8.2 (L) 06/09/2022 0334   GFRNONAA >60 06/09/2022 0334   GFRNONAA >60 07/10/2020 1220   Results for orders placed or performed during the hospital encounter of 06/04/22  Aerobic/Anaerobic Culture w Gram Stain (surgical/deep wound)     Status: None (Preliminary result)   Collection Time: 06/04/22  3:38 PM   Specimen: Synovial, Left Knee; Body Fluid  Result Value Ref Range Status   Specimen Description   Final    WOUND Performed at [facility], [address], [place], [place] [address]    Special Requests   Final    LEFT KNEE Performed at [facility], [address] [place], [place] [address]    Gram Stain   Final    ABUNDANT WBC PRESENT, PREDOMINANTLY PMN NO ORGANISMS SEEN Performed at [facility], [address], [place], [place] [address]    Culture   Final    RARE ENTEROCOCCUS FAECALIS NO ANAEROBES ISOLATED; CULTURE IN PROGRESS FOR 5 DAYS    Report Status PENDING  Incomplete   Organism ID, Bacteria ENTEROCOCCUS FAECALIS  Final      Susceptibility   Enterococcus faecalis - MIC*    AMPICILLIN <=2 SENSITIVE Sensitive     VANCOMYCIN 1 SENSITIVE Sensitive     GENTAMICIN SYNERGY SENSITIVE Sensitive     * RARE ENTEROCOCCUS FAECALIS  Aerobic/Anaerobic Culture w Gram Stain (surgical/deep wound)     Status: None (Preliminary result)   Collection Time: 06/04/22  3:50 PM   Specimen: Synovial, Left Knee; Body Fluid  Result Value Ref Range Status   Specimen Description   Final    WOUND Performed at [facility], [address], [place], [place] [address]    Special Requests   Final    LEFT KNEE Performed at [facility], [address] [place], [place] [address]  Gram Stain   Final    FEW WBC PRESENT, PREDOMINANTLY PMN NO ORGANISMS SEEN Performed at [facility], [address], [place], [place] [address]    Culture   Final    RARE ENTEROCOCCUS FAECALIS SUSCEPTIBILITIES PERFORMED ON PREVIOUS CULTURE WITHIN THE LAST 5 DAYS. NO ANAEROBES ISOLATED; CULTURE IN PROGRESS FOR 5 DAYS    Report Status PENDING  Incomplete  Aerobic/Anaerobic Culture w Gram Stain (surgical/deep wound)     Status: None (Preliminary result)   Collection Time: 06/07/22  8:59 AM   Specimen: PATH Soft tissue  Result Value Ref Range Status   Specimen Description   Final    TISSUE Performed at [facility], [address] [place], [place] [address]    Special Requests    Final    LEFT KNEE Performed at [facility], [address], [place], [place] [address]    Gram Stain   Final    MODERATE WBC PRESENT, PREDOMINANTLY PMN NO ORGANISMS SEEN    Culture   Final    CULTURE REINCUBATED FOR BETTER GROWTH Performed at [facility], [address] [place], [place] [address]    Report Status PENDING  Incomplete     Assessment/Plan: 2 Days Post-Op   Principal Problem:   Surgical wound dehiscence Active Problems:   HTN (hypertension)   Bipolar disorder, unspecified (HCC)   History of CVA (cerebrovascular accident)   Hypokalemia   Liver cirrhosis (HCC)   ABLA. Hemoglobin 7.9 this morning. Continue to monitor.    Hypokalemia. Potassium 3.4 this morning. TRH monitoring.    Weight-bear as tolerated with walker. Knee immobilizer at all times; do not bend the knee.   DVT ppx: Lovenox, SCDs, TEDS PO pain control: Pain medication adjusted.  Patient ambulated 4 feet with PT yesterday. Limited due to pain.  Dispo:  - Nutrition saw patient yesterday.  [facility] consulting for medical management of patient. Appreciate their assistance.  - Continue wound vac and hemovac drain. - Dr. Ulice Bold with plastic surgery, plans to take the patient back to the OR this upcoming week.   - Continue to follow intraoperative cultures. Broad-spectrum IV antibiotics for now. Once we have the final culture and susceptibilities, we will place an infectious disease consult for IV selection and duration.  She will need a PICC line for long-term IV antibiotics.   Clois Dupes, PA-C 06/09/2022, 7:53 AM   [facility]  [facility] [address], [place], [place] [address] Phone: [phone] [email or web address] Facebook  [employer]

## 2022-06-09 NOTE — Telephone Encounter (Signed)
Spoke with pt via telephone to inform pt that Dr. Mosetta Putt has read through her chart and is aware of her hospitalization.  Informed pt that Dr. Mosetta Putt would like for her to contact our office once she's been d/c from the hospital to schedule a f/u appt.  Pt verbalized understanding and had no further questions or concerns.

## 2022-06-10 ENCOUNTER — Ambulatory Visit: Payer: Medicare Other | Admitting: Physical Therapy

## 2022-06-10 DIAGNOSIS — C50412 Malignant neoplasm of upper-outer quadrant of left female breast: Secondary | ICD-10-CM

## 2022-06-10 DIAGNOSIS — Z170 Estrogen receptor positive status [ER+]: Secondary | ICD-10-CM

## 2022-06-10 DIAGNOSIS — E876 Hypokalemia: Secondary | ICD-10-CM | POA: Diagnosis not present

## 2022-06-10 DIAGNOSIS — D62 Acute posthemorrhagic anemia: Secondary | ICD-10-CM | POA: Diagnosis not present

## 2022-06-10 DIAGNOSIS — K7460 Unspecified cirrhosis of liver: Secondary | ICD-10-CM

## 2022-06-10 DIAGNOSIS — I10 Essential (primary) hypertension: Secondary | ICD-10-CM | POA: Diagnosis not present

## 2022-06-10 DIAGNOSIS — T8131XA Disruption of external operation (surgical) wound, not elsewhere classified, initial encounter: Secondary | ICD-10-CM | POA: Diagnosis not present

## 2022-06-10 DIAGNOSIS — B182 Chronic viral hepatitis C: Secondary | ICD-10-CM

## 2022-06-10 LAB — BASIC METABOLIC PANEL
Anion gap: 8 (ref 5–15)
BUN: 13 mg/dL (ref 8–23)
CO2: 28 mmol/L (ref 22–32)
Calcium: 9 mg/dL (ref 8.9–10.3)
Chloride: 104 mmol/L (ref 98–111)
Creatinine, Ser: 0.56 mg/dL (ref 0.44–1.00)
GFR, Estimated: >60 mL/min (ref >60)
Glucose, Bld: 98 mg/dL (ref 70–99)
Potassium: 3.5 mmol/L (ref 3.5–5.1)
Sodium: 140 mmol/L (ref 135–145)

## 2022-06-10 LAB — CBC
HCT: 29 % — ABNORMAL LOW (ref 36.0–46.0)
Hemoglobin: 9.2 g/dL — ABNORMAL LOW (ref 12.0–15.0)
MCH: 28.8 pg (ref 26.0–34.0)
MCHC: 31.7 g/dL (ref 30.0–36.0)
MCV: 90.9 fL (ref 80.0–100.0)
Platelets: 334 10*3/uL (ref 150–400)
RBC: 3.19 MIL/uL — ABNORMAL LOW (ref 3.87–5.11)
RDW: 16.3 % — ABNORMAL HIGH (ref 11.5–15.5)
WBC: 5.5 10*3/uL (ref 4.0–10.5)
nRBC: 0 % (ref 0.0–0.2)

## 2022-06-10 MED ORDER — HYDRALAZINE HCL 20 MG/ML IJ SOLN: 5.0000 mg | Freq: Four times a day (QID) | INTRAMUSCULAR | Status: DC | PRN

## 2022-06-10 NOTE — Hospital Course (Addendum)
67 year old female with medical history of prior CVA, liver cirrhosis from hepatitis C, history of breast cancer on Arimidex, chronic hypokalemia admitted to hospital by Dr. [name] for multistage operative repair of left distal femur wound.  She fell and broke her distal femur last year, was taken to the OR on 04/10/2021 by Dr. Carola Frost for ORIF, left distal femur with intercondylar extension.  She then underwent hardware removal and distal femur replacement on 04/30/2022.  She has been readmitted by orthopedic surgery on 4/25 due to wound dehiscence of surgical site.  She was taken for debridement and washout with placement of wound VAC on 4/25.   The hospitalist group was consulted for assistance with electrolyte management, management of hypertension and ensuring patient readiness for surgery.  In the days that followed blood pressures became much better controlled with titration of multiple antihypertensives.  There was concern for possible primary aldosteronism.  Laboratory workup was obtained.  Hypokalemia was repeatedly corrected with oral potassium chloride supplementation.  Dr. [name] consulted Dr. Ulice Bold with plastic surgery and patient underwent reconstructive plastic surgery of the wound on 06/15/2022.

## 2022-06-10 NOTE — Progress Notes (Addendum)
CONSULT PROGRESS NOTE   Tammy [name]  ZOX:[id number] DOB: 05/29/1955 DOA: 06/04/2022 PCP: Quitman Livings, MD   Date of Service: the patient was seen and examined on 06/10/2022  Brief Narrative:  67 year old female with medical history of prior CVA, liver cirrhosis from hepatitis C, history of breast cancer on Arimidex, chronic hypokalemia admitted to hospital by Dr. [name] for multistage operative repair of left distal femur wound.  She fell and broke her distal femur last year, was taken to the OR on 04/10/2021 by Dr. Carola Frost for ORIF, left distal femur with intercondylar extension.  She then underwent hardware removal and distal femur replacement on 04/30/2022.  She has been readmitted by orthopedic surgery on 4/25 due to wound dehiscence of surgical site.  She was taken for debridement and washout with placement of wound VAC on 4/25.   Dr. [name] plans larger reconstruction surgery with the assistance of plastic surgery during this hospital stay group has been consulted for assistance with electrolyte management, management of hypertension and ensuring patient readiness for surgery.   Assessment and Plan: * Surgical wound dehiscence, initial encounter Continued management per primary team and infectious disease Wound VAC in place Following cultures Continuing intravenous antibiotics with Unasyn with anticipation of total of 6 weeks of antibiotic therapy Dr. Drake Leach him with plastic surgery planning to take patient back to the operating room on 06/15/2022  Hypertension Concern for possible secondary cause of hypertension such as primary aldosteronism considering concurrent hypokalemia While workup is warranted (plasma renin activity and aldosterone/renin ratio pending), pretest probability is not extremely high as the patient is classically have extremely difficult to control hypertension Following workup Continuing hydralazine and amlodipine. As needed intravenous hydralazine for  markedly elevated blood pressure  Hypokalemia Improved potassium with replacement Continue to monitor potassium and magnesium levels closely and replacing as necessary Possible association with primary aldosteronism as mentioned above  Acute postoperative anemia due to expected blood loss Likely multifactorial due to expected postoperative blood losses and hemodilution Hemoglobin already uptrending today Will obtain basic anemia workup with vitamin B12, folate, iron panel to evaluate for any concurrent deficiencies Continue to monitor hemoglobin and hematocrit, transfuse if hemoglobin less than 7 or evidence of active bleeding  Chronic hepatitis C with cirrhosis (HCC) Known history of hepatitis C cirrhosis per review of prior records No sequela of advanced liver disease Last INR in Care Everywhere with revealed good synthetic function at 1.0  Malignant neoplasm of upper-outer quadrant of left breast in female, estrogen receptor positive (HCC) Continue Arimidex    Subjective:  Patient reports pain is improving, mild to moderate intensity, sharp in quality, located in the left thigh, worse with movement and improved with rest.  Physical Exam:  Vitals:   06/09/22 2249 06/10/22 0628 06/10/22 1323 06/10/22 2126  BP: (!) 155/71 (!) 164/71 130/62 (!) 148/76  Pulse: 71 62 68 63  Resp: 15 16 18 17   Temp: 98.6 F (37 C) 98.2 F (36.8 C) 99 F (37.2 C) 98.4 F (36.9 C)  TempSrc: Oral Oral Oral   SpO2: 100% 100% 96% 95%  Weight:      Height:        Constitutional: Awake alert and oriented x3, no associated distress.   Skin: Wound VAC in place. Eyes: Pupils are equally reactive to light.  No evidence of scleral icterus or conjunctival pallor.  ENMT: Moist mucous membranes noted.  Posterior pharynx clear of any exudate or lesions.   Respiratory: clear to auscultation bilaterally, no wheezing, no crackles.  Normal respiratory effort. No accessory muscle use.  Cardiovascular: Regular  rate and rhythm, no murmurs / rubs / gallops. No extremity edema. 2+ pedal pulses. No carotid bruits.  Abdomen: Abdomen is soft and nontender.  No evidence of intra-abdominal masses.  Positive bowel sounds noted in all quadrants.   Musculoskeletal: Pain of the left lower extremity with passive and active range of motion..    Data Reviewed:  I have personally reviewed and interpreted labs, imaging.  Significant findings are   CBC: Recent Labs  Lab 06/06/22 0324 06/06/22 1944 06/07/22 0324 06/08/22 0334 06/09/22 0334 06/10/22 0348  WBC 7.7  --  5.5 9.2 6.3 5.5  HGB 8.7* 11.2* 11.5* 8.3* 7.9* 9.2*  HCT 26.4* 33.8* 34.9* 25.8* 24.7* 29.0*  MCV 87.7  --  87.7 89.9 90.5 90.9  PLT 308  --  317 307 282 334   Basic Metabolic Panel: Recent Labs  Lab 06/06/22 0849 06/06/22 1016 06/06/22 1654 06/07/22 0324 06/08/22 0334 06/09/22 0334 06/10/22 0348  NA 139 137  --  138 137 138 140  K 2.9* 2.9* 3.3* 3.7 3.7 3.4* 3.5  CL 106 103  --  105 105 106 104  CO2 22 23  --  23 24 25 28   GLUCOSE 85 88  --  94 121* 114* 98  BUN 13 12  --  14 13 15 13   CREATININE 0.78 0.79  --  0.85 0.78 0.66 0.56  CALCIUM 8.2* 8.3*  --  8.6* 8.5* 8.2* 9.0  MG 1.8  --   --   --   --   --   --    GFR: Estimated Creatinine Clearance: 65.2 mL/min (by C-G formula based on SCr of 0.56 mg/dL). Liver Function Tests: Recent Labs  Lab 06/06/22 1016  AST 13*  ALT 9  ALKPHOS 54  BILITOT 0.6  PROT 6.3*  ALBUMIN 2.5*     Code Status:  Full code.     Time spent:  38 minutes  Author:  Marinda Elk MD  06/10/2022 11:05 PM

## 2022-06-10 NOTE — Progress Notes (Signed)
Physical Therapy Treatment Patient Details Name: Tammy [name] MRN: [id number] DOB: [birth year] Today's Date: 06/10/2022   History of Present Illness 67 y.o. 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 She is s/p left distal femur ORIF fracture 04/10/21 with Dr. 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 s/p removal of implant and replacement of L distal femur 04/30/22. Now admitted 06/04/22 with necrotic wound s/p surgical debridement. And s/p repeat debridement with exchange of rotating hinge bearing 06/07/22.  PMH: bipolar, CVA.    PT Comments    Pt is progressing well with mobility, she tolerated increased ambulation distance of 50' with RW and L knee immobilizer, no loss of balance.    Recommendations for follow up therapy are one component of a multi-disciplinary discharge planning process, led by the attending physician.  Recommendations may be updated based on patient status, additional functional criteria and insurance authorization.  Follow Up Recommendations       Assistance Recommended at Discharge Intermittent Supervision/Assistance  Patient can return home with the following A little help with bathing/dressing/bathroom;Assist for transportation;Help with stairs or ramp for entrance;Assistance with cooking/housework;A little help with walking and/or transfers   Equipment Recommendations  None recommended by PT    Recommendations for Other Services       Precautions / Restrictions Precautions Precautions: Fall Precaution Comments: wound vac L knee; KI all times, no L knee flexion Required Braces or Orthoses: Knee Immobilizer - Left Knee Immobilizer - Left: On at all times Restrictions Weight Bearing Restrictions:  No LLE Weight Bearing: Weight bearing as tolerated     Mobility  Bed Mobility Overal bed mobility: Needs Assistance Bed Mobility: Supine to Sit     Supine to sit: HOB elevated, Supervision     General bed mobility comments: pt used gait belt as LLE lifter    Transfers Overall transfer level: Needs assistance Equipment used: Rolling walker (2 wheels) Transfers: Sit to/from Stand, Bed to chair/wheelchair/BSC Sit to Stand: Supervision   Step pivot transfers: Min guard       General transfer comment: VCs hand placement, steady, no loss of balance, KI on LLE    Ambulation/Gait Ambulation/Gait assistance: Supervision Gait Distance (Feet): 50 Feet Assistive device: Rolling walker (2 wheels) Gait Pattern/deviations: Step-to pattern, Decreased stance time - left, Antalgic Gait velocity: decr     General Gait Details: KI on L knee, steady, no loss of balance   Stairs             Wheelchair Mobility    Modified Rankin (Stroke Patients Only)       Balance Overall balance assessment: Needs assistance   Sitting balance-Leahy Scale: Good     Standing balance support: Bilateral upper extremity supported, During functional activity, Reliant on assistive device for balance Standing balance-Leahy Scale: Poor                              Cognition Arousal/Alertness: Awake/alert Behavior During Therapy: WFL for tasks assessed/performed Overall Cognitive Status: Within Functional Limits for tasks assessed  Exercises Total Joint Exercises Ankle Circles/Pumps: AROM, Both, 10 reps, Seated Quad Sets: AROM, Both, 5 reps, Seated Hip ABduction/ADduction: AAROM, Left, 5 reps, Seated Straight Leg Raises: AAROM, Left, 5 reps, Seated    General Comments        Pertinent Vitals/Pain Pain Assessment Pain Score: 2  Pain Location: L knee with walking Pain Descriptors / Indicators: Sore Pain  Intervention(s): Limited activity within patient's tolerance, Monitored during session, Premedicated before session    Home Living                          Prior Function            PT Goals (current goals can now be found in the care plan section) Acute Rehab PT Goals Patient Stated Goal: go to [place], get married without using a walker PT Goal Formulation: With patient Time For Goal Achievement: 05/08/22 Potential to Achieve Goals: Good Progress towards PT goals: Progressing toward goals    Frequency    Min 1X/week      PT Plan Current plan remains appropriate    Co-evaluation              AM-PAC PT "6 Clicks" Mobility   Outcome Measure  Help needed turning from your back to your side while in a flat bed without using bedrails?: None Help needed moving from lying on your back to sitting on the side of a flat bed without using bedrails?: A Little Help needed moving to and from a bed to a chair (including a wheelchair)?: A Little Help needed standing up from a chair using your arms (e.g., wheelchair or bedside chair)?: A Little Help needed to walk in hospital room?: A Lot Help needed climbing 3-5 steps with a railing? : A Little 6 Click Score: 18    End of Session Equipment Utilized During Treatment: Gait belt Activity Tolerance: Patient tolerated treatment well Patient left: in chair;with call bell/phone within reach;with chair alarm set Nurse Communication: Mobility status PT Visit Diagnosis: Difficulty in walking, not elsewhere classified (R26.2);Other abnormalities of gait and mobility (R26.89) Pain - Right/Left: Left Pain - part of body: Knee     Time: 1005-1026 PT Time Calculation (min) (ACUTE ONLY): 21 min  Charges:  $Gait Training: 8-22 mins                     Ralene Bathe Kistler PT 06/10/2022  Acute Rehabilitation Services  Office [phone]

## 2022-06-10 NOTE — Progress Notes (Signed)
PHARMACY CONSULT NOTE FOR:  OUTPATIENT  PARENTERAL ANTIBIOTIC THERAPY (OPAT)  Indication: LLE SSI w/ hardware Regimen: Ampicillin 12g IV daily as a continuous infusion End date: 07/19/22 (6 weeks from 06/07/22)  IV antibiotic discharge orders are pended. To discharging provider:  please sign these orders via discharge navigator,  Select New Orders & click on the button choice - Manage This Unsigned Work.     Thank you for allowing pharmacy to be a part of this patient's care.  Georgina Pillion, PharmD, BCPS Infectious Diseases Clinical Pharmacist 06/12/2022 8:23 AM   **Pharmacist phone directory can now be found on [email or web address] ([email or web address]).  Listed under [facility].

## 2022-06-10 NOTE — Assessment & Plan Note (Addendum)
Known history of hepatitis C cirrhosis per review of prior records No sequela of advanced liver disease INR  consistent with good synthetic function of the liver

## 2022-06-10 NOTE — Assessment & Plan Note (Addendum)
Likely multifactorial  Hemoglobin has stabilized. Iron panel revealing low iron saturation with borderline iron level.   Received intravenous Ferrlecit given on 5/2 and 5/4. Continue to monitor hemoglobin and hematocrit, transfuse if hemoglobin less than 7 or evidence of active bleeding

## 2022-06-10 NOTE — Plan of Care (Signed)
  Problem: Health Behavior/Discharge Planning: Goal: Ability to manage health-related needs will improve Outcome: Progressing   Problem: Clinical Measurements: Goal: Will remain free from infection Outcome: Progressing   Problem: Activity: Goal: Risk for activity intolerance will decrease Outcome: Progressing   

## 2022-06-10 NOTE — Assessment & Plan Note (Addendum)
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

## 2022-06-10 NOTE — Assessment & Plan Note (Signed)
Continue Arimidex 

## 2022-06-10 NOTE — Assessment & Plan Note (Addendum)
Improved antihypertensive control Concern for possible secondary cause of hypertension such as primary aldosteronism considering concurrent hypokalemia Following aldosterone and renin activity with ratio still pending Continuing hydralazine to 25 mg 3 times daily.  Continuing amlodipine 10 mg daily Losartan to 50 mg mg daily.  As needed intravenous hydralazine for markedly elevated blood pressure

## 2022-06-10 NOTE — Assessment & Plan Note (Addendum)
Continued management per primary team and infectious disease Wound VAC in place Intraoperative cultures on 4/25 and 4/28 both growing out Enterococcus faecalis Continuing intravenous antibiotics.  Patient has been transition to intravenous ampicillin based on recent sensitivities with anticipation of total of 6 weeks of antibiotic therapy ending on 07/19/2022 PICC line placed 5/3 Dr. Ulice Bold with plastic surgery who is planning to take patient to the operating room on 06/15/2022

## 2022-06-10 NOTE — Progress Notes (Signed)
Subjective:  Patient reports pain as mild to moderate.  Denies N/V/CP/SOB/Abd pain. She states that her pain did much better yesterday. She states she did well with PT. She denies any tinging or numbness in LE bilaterally.   Objective:   VITALS:   Vitals:   06/09/22 1416 06/09/22 1743 06/09/22 2249 06/10/22 0628  BP: (!) 151/69  (!) 155/71 (!) 164/71  Pulse: 77  71 62  Resp: 20  15 16   Temp: 98.5 F (36.9 C) 98.4 F (36.9 C) 98.6 F (37 C) 98.2 F (36.8 C)  TempSrc: Oral  Oral Oral  SpO2: 98%  100% 100%  Weight:      Height:        Patient sitting up in bed. NAD.  Neurologically intact ABD soft Neurovascular intact Sensation intact distally Intact pulses distally Dorsiflexion/Plantar flexion intact No cellulitis present Compartment soft Negative pressure dressing C/D/I. No leaks detected. Wound vac . 75 cc SS output, 275 total today.  Hemovac drain dressing C/D/I. 10 cc bloody fluid. Continue drain.  Knee immobilizer on.   Lab Results  Component Value Date   WBC 5.5 06/10/2022   HGB 9.2 (L) 06/10/2022   HCT 29.0 (L) 06/10/2022   MCV 90.9 06/10/2022   PLT 334 06/10/2022   BMET    Component Value Date/Time   NA 140 06/10/2022 0348   K 3.5 06/10/2022 0348   CL 104 06/10/2022 0348   CO2 28 06/10/2022 0348   GLUCOSE 98 06/10/2022 0348   BUN 13 06/10/2022 0348   CREATININE 0.56 06/10/2022 0348   CREATININE 0.76 07/10/2020 1220   CALCIUM 9.0 06/10/2022 0348   GFRNONAA >60 06/10/2022 0348   GFRNONAA >60 07/10/2020 1220   Results for orders placed or performed during the hospital encounter of 06/04/22  Aerobic/Anaerobic Culture w Gram Stain (surgical/deep wound)     Status: None   Collection Time: 06/04/22  3:38 PM   Specimen: Synovial, Left Knee; Body Fluid  Result Value Ref Range Status   Specimen Description   Final    WOUND Performed at [facility], [address], [place], [place] [address]    Special Requests   Final     LEFT KNEE Performed at [facility], [address], [place], [place] [address]    Gram Stain   Final    ABUNDANT WBC PRESENT, PREDOMINANTLY PMN NO ORGANISMS SEEN    Culture   Final    RARE ENTEROCOCCUS FAECALIS NO ANAEROBES ISOLATED Performed at [facility], [address] [place], [place] [address]    Report Status 06/09/2022 FINAL  Final   Organism ID, Bacteria ENTEROCOCCUS FAECALIS  Final      Susceptibility   Enterococcus faecalis - MIC*    AMPICILLIN <=2 SENSITIVE Sensitive     VANCOMYCIN 1 SENSITIVE Sensitive     GENTAMICIN SYNERGY SENSITIVE Sensitive     * RARE ENTEROCOCCUS FAECALIS  Aerobic/Anaerobic Culture w Gram Stain (surgical/deep wound)     Status: None   Collection Time: 06/04/22  3:50 PM   Specimen: Synovial, Left Knee; Body Fluid  Result Value Ref Range Status   Specimen Description   Final    WOUND Performed at [facility], [address], [place], [place] [address]    Special Requests   Final    LEFT KNEE Performed at [facility], [address], [place], [place] [address]    Gram Stain   Final  FEW WBC PRESENT, PREDOMINANTLY PMN NO ORGANISMS SEEN    Culture   Final    RARE ENTEROCOCCUS FAECALIS SUSCEPTIBILITIES PERFORMED ON PREVIOUS CULTURE WITHIN THE LAST 5 DAYS. NO ANAEROBES ISOLATED Performed at [facility], [address], [place], [place] [address]    Report Status 06/09/2022 FINAL  Final  Aerobic/Anaerobic Culture w Gram Stain (surgical/deep wound)     Status: None (Preliminary result)   Collection Time: 06/07/22  8:59 AM   Specimen: PATH Soft tissue  Result Value Ref Range Status   Specimen Description   Final    TISSUE Performed at [facility], [address], [place], [place] [address]    Special Requests   Final    LEFT KNEE Performed at [facility], [address], [place], [place] [address]    Gram Stain   Final     MODERATE WBC PRESENT, PREDOMINANTLY PMN NO ORGANISMS SEEN Performed at [facility], [address] [place], [place] [address]    Culture   Final    RARE ENTEROCOCCUS FAECALIS SUSCEPTIBILITIES TO FOLLOW NO ANAEROBES ISOLATED; CULTURE IN PROGRESS FOR 5 DAYS    Report Status PENDING  Incomplete     Assessment/Plan: 3 Days Post-Op   Principal Problem:   Surgical wound dehiscence Active Problems:   HTN (hypertension)   Bipolar disorder, unspecified (HCC)   History of CVA (cerebrovascular accident)   Hypokalemia   Liver cirrhosis (HCC)   ABLA. Hemoglobin 9.2 this morning. Continue to monitor.    Hypokalemia. Potassium 3.5 this morning. TRH monitoring.    Weight-bear as tolerated with walker. Knee immobilizer at all times; do not bend the knee.   DVT ppx: Lovenox, SCDs, TEDS PO pain control: Better controlled today.  PT/OT: Patient ambulated well with PT yesterday 15 feet.  Dispo:  - Nutrition consult completed.  [facility] consulting for medical management of patient. Appreciate their assistance.  - Continue wound vac and hemovac drain. - Dr. Ulice Bold with plastic surgery, plans to take the patient back to the OR 06/15/22.  - Continue to follow intraoperative cultures.  - Infectious disease consulted yesterday. IV antibiotics adjusted to amp-sulbactam. She will need a PICC line for long-term IV antibiotics for at least 6 weeks.  Clois Dupes, PA-C 06/10/2022, 7:54 AM   [facility]  [place] [address], [address], [place], [place] [address] Phone: [phone] [email or web address] Facebook  [employer]

## 2022-06-11 DIAGNOSIS — F319 Bipolar disorder, unspecified: Secondary | ICD-10-CM

## 2022-06-11 DIAGNOSIS — M8618 Other acute osteomyelitis, other site: Secondary | ICD-10-CM | POA: Diagnosis not present

## 2022-06-11 DIAGNOSIS — T847XXD Infection and inflammatory reaction due to other internal orthopedic prosthetic devices, implants and grafts, subsequent encounter: Secondary | ICD-10-CM

## 2022-06-11 DIAGNOSIS — I10 Essential (primary) hypertension: Secondary | ICD-10-CM | POA: Diagnosis not present

## 2022-06-11 DIAGNOSIS — E876 Hypokalemia: Secondary | ICD-10-CM | POA: Diagnosis not present

## 2022-06-11 DIAGNOSIS — T8131XA Disruption of external operation (surgical) wound, not elsewhere classified, initial encounter: Secondary | ICD-10-CM | POA: Diagnosis not present

## 2022-06-11 DIAGNOSIS — Z8673 Personal history of transient ischemic attack (TIA), and cerebral infarction without residual deficits: Secondary | ICD-10-CM

## 2022-06-11 DIAGNOSIS — D62 Acute posthemorrhagic anemia: Secondary | ICD-10-CM | POA: Diagnosis not present

## 2022-06-11 LAB — CBC
HCT: 27.5 % — ABNORMAL LOW (ref 36.0–46.0)
Hemoglobin: 8.9 g/dL — ABNORMAL LOW (ref 12.0–15.0)
MCH: 29.6 pg (ref 26.0–34.0)
MCHC: 32.4 g/dL (ref 30.0–36.0)
MCV: 91.4 fL (ref 80.0–100.0)
Platelets: 316 10*3/uL (ref 150–400)
RBC: 3.01 MIL/uL — ABNORMAL LOW (ref 3.87–5.11)
RDW: 16.4 % — ABNORMAL HIGH (ref 11.5–15.5)
WBC: 5.9 10*3/uL (ref 4.0–10.5)
nRBC: 0 % (ref 0.0–0.2)

## 2022-06-11 LAB — VITAMIN B12: Vitamin B-12: 1256 pg/mL — ABNORMAL HIGH (ref 180–914)

## 2022-06-11 LAB — IRON AND TIBC
Iron: 33 ug/dL (ref 28–170)
Saturation Ratios: 9 % — ABNORMAL LOW (ref 10.4–31.8)
TIBC: 388 ug/dL (ref 250–450)
UIBC: 355 ug/dL

## 2022-06-11 LAB — FERRITIN: Ferritin: 34 ng/mL (ref 11–307)

## 2022-06-11 LAB — BASIC METABOLIC PANEL
Anion gap: 11 (ref 5–15)
BUN: 17 mg/dL (ref 8–23)
CO2: 27 mmol/L (ref 22–32)
Calcium: 9.1 mg/dL (ref 8.9–10.3)
Chloride: 101 mmol/L (ref 98–111)
Creatinine, Ser: 0.57 mg/dL (ref 0.44–1.00)
GFR, Estimated: >60 mL/min (ref >60)
Glucose, Bld: 98 mg/dL (ref 70–99)
Potassium: 3.6 mmol/L (ref 3.5–5.1)
Sodium: 139 mmol/L (ref 135–145)

## 2022-06-11 LAB — PROTIME-INR
INR: 0.9 (ref 0.8–1.2)
Prothrombin Time: 12.5 seconds (ref 11.4–15.2)

## 2022-06-11 LAB — MAGNESIUM: Magnesium: 2 mg/dL (ref 1.7–2.4)

## 2022-06-11 LAB — FOLATE: Folate: 7.9 ng/mL (ref >5.9)

## 2022-06-11 LAB — APTT: aPTT: 27 seconds (ref 24–36)

## 2022-06-11 MED ORDER — LOSARTAN POTASSIUM 25 MG PO TABS
25.0000 mg | ORAL_TABLET | Freq: Every day | ORAL | Status: DC
  Administered 2022-06-11 – 2022-06-12 (×2): 25 mg via ORAL
  Filled 2022-06-11 (×2): qty 1

## 2022-06-11 MED ORDER — HYDRALAZINE HCL 25 MG PO TABS: 25.0000 mg | ORAL_TABLET | Freq: Three times a day (TID) | ORAL | Status: DC

## 2022-06-11 MED ORDER — HYDRALAZINE HCL 25 MG PO TABS
25.0000 mg | ORAL_TABLET | Freq: Three times a day (TID) | ORAL | Status: DC
  Administered 2022-06-11 – 2022-06-18 (×20): 25 mg via ORAL
  Filled 2022-06-11 (×19): qty 1

## 2022-06-11 MED ORDER — HYDRALAZINE HCL 25 MG PO TABS: 25.0000 mg | ORAL_TABLET | Freq: Two times a day (BID) | ORAL | Status: DC

## 2022-06-11 MED ORDER — SODIUM CHLORIDE 0.9 % IV SOLN
250.0000 mg | Freq: Once | INTRAVENOUS | Status: AC
  Administered 2022-06-11: 250 mg via INTRAVENOUS
  Filled 2022-06-11: qty 20

## 2022-06-11 MED ORDER — SODIUM CHLORIDE 0.9 % IV SOLN
2.0000 g | INTRAVENOUS | Status: DC
  Administered 2022-06-11 – 2022-06-18 (×41): 2 g via INTRAVENOUS
  Filled 2022-06-11 (×45): qty 2000

## 2022-06-11 NOTE — Progress Notes (Signed)
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 Likely will need long term antibiotics suppression  ------ 5/2 assessment Needing more I&D with plastic surgery As above 4/25 and 4/28 operative cx e faecalis  Diarrhea with amp-sulb  Will change abx to ampicillin alone. She'll need long term suppressive amoxicillin after 6 weeks of ampicillin, and can start clock on 4/28     Plan: Change abx to ampicillin Once done with I&D and surgery ok to discharge Abx plan as below with ampicillin ID clinic f/u as below Will sign off Discussed with primary team   OPAT Orders Discharge antibiotics to be given via PICC line Discharge antibiotics: Ampicillin 12 gram iv continuous daily infusion   Duration: 6 weeks from 4/28  End Date: 07/19/22  [facility] Per Protocol:  Home health RN for IV administration and teaching; PICC line care and labs.    Labs weekly while on IV antibiotics: _x_ CBC with differential __ BMP _x_ CMP _x_ CRP __ ESR __ Vancomycin trough __ CK  _x_ Please pull PIC at completion of IV antibiotics __ Please leave PIC in place until doctor has seen patient or been  notified  Fax weekly labs to [phone]  Clinic Follow Up Appt: 5/30 @ 245  @  [facility] [address], [place], [place] [address] Phone: [phone]  Principal Problem:   Surgical wound dehiscence, initial encounter Active Problems:   Hypertension   Bipolar disorder, unspecified (HCC)   Malignant neoplasm of upper-outer quadrant of left breast in female, estrogen receptor positive (HCC)   History of CVA (cerebrovascular accident)   Acute postoperative anemia due to expected blood loss   Hypokalemia   Chronic hepatitis C with cirrhosis (HCC)   Allergies  Allergen Reactions   Lisinopril Cough    Scheduled Meds:  amLODipine  10 mg Oral Daily   anastrozole  1 mg Oral Daily   vitamin C  1,000 mg Oral Daily   buPROPion  150 mg Oral BID   calcium carbonate  1 tablet Oral Q breakfast   celecoxib  200 mg Oral BID   cyanocobalamin  1,000 mcg Oral Daily   docusate sodium  100 mg Oral BID   enoxaparin (LOVENOX) injection  30 mg Subcutaneous Q12H   gabapentin  100 mg Oral BID   hydrALAZINE  25 mg Oral TID   losartan  25 mg Oral Daily  multivitamin with minerals  1 tablet Oral Daily   nutrition supplement (JUVEN)  1 packet Oral BID BM   pantoprazole  40 mg Oral Daily   potassium chloride  10 mEq Oral Daily   senna  1 tablet Oral BID   zinc sulfate  220 mg Oral Daily   Continuous Infusions:  sodium chloride 10 mL/hr at 06/08/22 0854   ampicillin (OMNIPEN) IV     ferric gluconate (FERRLECIT) IVPB     methocarbamol (ROBAXIN) IV     PRN Meds:.acetaminophen, alum & mag hydroxide-simeth, diphenhydrAMINE, hydrALAZINE, HYDROmorphone (DILAUDID) injection, menthol-cetylpyridinium **OR** phenol, methocarbamol **OR** methocarbamol (ROBAXIN) IV, metoCLOPramide **OR** metoCLOPramide (REGLAN) injection, ondansetron **OR** ondansetron (ZOFRAN) IV, oxyCODONE, oxyCODONE, polyethylene glycol   SUBJECTIVE: Doing well, more surgery plan with plastic surgery in conjunction with  ortho. E faecalis only, grew from previous cx Diarrhea No rash No n/v Afebrile   Review of Systems: ROS All other ROS was negative, except mentioned above     OBJECTIVE: Vitals:   06/10/22 0628 06/10/22 1323 06/10/22 2126 06/11/22 0535  BP: (!) 164/71 130/62 (!) 148/76 (!) 157/74  Pulse: 62 68 63 63  Resp: 16 18 17 18   Temp: 98.2 F (36.8 C) 99 F (37.2 C) 98.4 F (36.9 C) 98.1 F (36.7 C)  TempSrc: Oral Oral  Oral  SpO2: 100% 96% 95% 97%  Weight:      Height:       Body mass index is 30.65 kg/m.  Physical Exam General/constitutional: no distress, pleasant HEENT: Normocephalic, PER, Conj Clear, EOMI, Oropharynx clear Neck supple CV: rrr no mrg Lungs: clear to auscultation, normal respiratory effort Abd: Soft, Nontender Ext: no edema Skin: No Rash Neuro: nonfocal MSK: left LE dressing clean/dry; drain present with serosanguinous output  Lab Results Lab Results  Component Value Date   WBC 5.9 06/11/2022   HGB 8.9 (L) 06/11/2022   HCT 27.5 (L) 06/11/2022   MCV 91.4 06/11/2022   PLT 316 06/11/2022    Lab Results  Component Value Date   CREATININE 0.57 06/11/2022   BUN 17 06/11/2022   NA 139 06/11/2022   K 3.6 06/11/2022   CL 101 06/11/2022   CO2 27 06/11/2022    Lab Results  Component Value Date   ALT 9 06/06/2022   AST 13 (L) 06/06/2022   ALKPHOS 54 06/06/2022   BILITOT 0.6 06/06/2022      Microbiology: Recent Results (from the past 240 hour(s))  Aerobic/Anaerobic Culture w Gram Stain (surgical/deep wound)     Status: None   Collection Time: 06/04/22  3:38 PM   Specimen: Synovial, Left Knee; Body Fluid  Result Value Ref Range Status   Specimen Description   Final    WOUND Performed at [facility], [address], [place], [place] [address]    Special Requests   Final    LEFT KNEE Performed at [facility], [address], [place], [place] [address]    Gram Stain   Final    ABUNDANT WBC PRESENT,  PREDOMINANTLY PMN NO ORGANISMS SEEN    Culture   Final    RARE ENTEROCOCCUS FAECALIS NO ANAEROBES ISOLATED Performed at [facility], [address] [place], [place] [address]    Report Status 06/09/2022 FINAL  Final   Organism ID, Bacteria ENTEROCOCCUS FAECALIS  Final      Susceptibility   Enterococcus faecalis - MIC*    AMPICILLIN <=2 SENSITIVE Sensitive     VANCOMYCIN 1 SENSITIVE Sensitive  GENTAMICIN SYNERGY SENSITIVE Sensitive     * RARE ENTEROCOCCUS FAECALIS  Aerobic/Anaerobic Culture w Gram Stain (surgical/deep wound)     Status: None   Collection Time: 06/04/22  3:50 PM   Specimen: Synovial, Left Knee; Body Fluid  Result Value Ref Range Status   Specimen Description   Final    WOUND Performed at [facility], [address] [place], [place] [address]    Special Requests   Final    LEFT KNEE Performed at [facility], [address] [place], [place] [address]    Gram Stain   Final    FEW WBC PRESENT, PREDOMINANTLY PMN NO ORGANISMS SEEN    Culture   Final    RARE ENTEROCOCCUS FAECALIS SUSCEPTIBILITIES PERFORMED ON PREVIOUS CULTURE WITHIN THE LAST 5 DAYS. NO ANAEROBES ISOLATED Performed at [facility], [address], [place], [place] [address]    Report Status 06/09/2022 FINAL  Final  Aerobic/Anaerobic Culture w Gram Stain (surgical/deep wound)     Status: None (Preliminary result)   Collection Time: 06/07/22  8:59 AM   Specimen: PATH Soft tissue  Result Value Ref Range Status   Specimen Description   Final    TISSUE Performed at [facility], [address], [place], [place] [address]    Special Requests   Final    LEFT KNEE Performed at [facility], [address] [place], [place] [address]    Gram Stain   Final    MODERATE WBC PRESENT, PREDOMINANTLY PMN NO ORGANISMS SEEN Performed at [facility], [address] [place], [place] [address]    Culture    Final    RARE ENTEROCOCCUS FAECALIS NO ANAEROBES ISOLATED; CULTURE IN PROGRESS FOR 5 DAYS    Report Status PENDING  Incomplete   Organism ID, Bacteria ENTEROCOCCUS FAECALIS  Final      Susceptibility   Enterococcus faecalis - MIC*    AMPICILLIN <=2 SENSITIVE Sensitive     VANCOMYCIN 1 SENSITIVE Sensitive     GENTAMICIN SYNERGY SENSITIVE Sensitive     * RARE ENTEROCOCCUS FAECALIS     Serology:   Imaging: If present, new imagings (plain films, ct scans, and mri) have been personally visualized and interpreted; radiology reports have been reviewed. Decision making incorporated into the Impression / Recommendations.   Raymondo Band, MD [facility] [phone] pager    06/11/2022, 11:06 AM

## 2022-06-11 NOTE — Care Management Important Message (Signed)
Important Message  Patient Details IM Letter given. Name: Tammy [name] MRN: [id number] Date of Birth: 23-Apr-1955   Medicare Important Message Given:  Yes     Caren Macadam 06/11/2022, 2:25 PM

## 2022-06-11 NOTE — Progress Notes (Addendum)
CONSULT PROGRESS NOTE   Tammy [name]  JWJ:[id number] DOB: 11/08/1955 DOA: 06/04/2022 PCP: Quitman Livings, MD   Date of Service: the patient was seen and examined on 06/11/2022  Brief Narrative:  67 year old female with medical history of prior CVA, liver cirrhosis from hepatitis C, history of breast cancer on Arimidex, chronic hypokalemia admitted to hospital by Dr. [name] for multistage operative repair of left distal femur wound.  She fell and broke her distal femur last year, was taken to the OR on 04/10/2021 by Dr. Carola Frost for ORIF, left distal femur with intercondylar extension.  She then underwent hardware removal and distal femur replacement on 04/30/2022.  She has been readmitted by orthopedic surgery on 4/25 due to wound dehiscence of surgical site.  She was taken for debridement and washout with placement of wound VAC on 4/25.   Dr. [name] plans larger reconstruction surgery with the assistance of plastic surgery during this hospital stay group has been consulted for assistance with electrolyte management, management of hypertension and ensuring patient readiness for surgery.   Assessment and Plan: * Surgical wound dehiscence, initial encounter Continued management per primary team and infectious disease Wound VAC in place Intraoperative cultures on 4/25 and 4/28 both growing out Enterococcus faecalis Continuing intravenous antibiotics ampicillin based on recent sensitivities with anticipation of total of 6 weeks of antibiotic therapy Dr. Ulice Bold with plastic surgery who is planning to take patient back to the operating room on 06/15/2022  Hypertension Concern for possible secondary cause of hypertension such as primary aldosteronism considering concurrent hypokalemia While workup is warranted (plasma renin activity and aldosterone/renin ratio pending), pretest probability is not extremely high as the patient is classically have extremely difficult to control  hypertension Following workup -aldosterone and renin activity with ratio still pending Increasing hydralazine to 25 mg 3 times daily.  Continuing amlodipine 10 mg daily Adding losartan 25 mg daily.  As needed intravenous hydralazine for markedly elevated blood pressure  Hypokalemia Improved potassium with replacement Continue to monitor potassium and magnesium levels closely and replacing as necessary Possible association with primary aldosteronism as mentioned above  Acute postoperative anemia due to expected blood loss Likely multifactorial due to expected postoperative blood losses and hemodilution Hemoglobin stable with no clinical evidence of active bleeding. Iron panel revealing low iron saturation with borderline iron level.  In the setting of postoperative bleeding we will give patient a course of intravenous Ferrlecit Continue to monitor hemoglobin and hematocrit, transfuse if hemoglobin less than 7 or evidence of active bleeding  Chronic hepatitis C with cirrhosis (HCC) Known history of hepatitis C cirrhosis per review of prior records No sequela of advanced liver disease INR performed today 0.9 consistent with good synthetic function of the liver  Malignant neoplasm of upper-outer quadrant of left breast in female, estrogen receptor positive (HCC) Continue Arimidex   Subjective:  Patient currently denying pain.  Patient reports good appetite and denies weakness.  Physical Exam:  Vitals:   06/10/22 1323 06/10/22 2126 06/11/22 0535 06/11/22 1323  BP: 130/62 (!) 148/76 (!) 157/74 (!) 161/77  Pulse: 68 63 63 86  Resp: 18 17 18 17   Temp: 99 F (37.2 C) 98.4 F (36.9 C) 98.1 F (36.7 C) 98.3 F (36.8 C)  TempSrc: Oral  Oral   SpO2: 96% 95% 97% 99%  Weight:      Height:         Constitutional: Awake alert and oriented x3, no associated distress.   Skin: Left thigh dressing unchanged with wound VAC  in place.   Eyes: Pupils are equally reactive to light.  No  evidence of scleral icterus or conjunctival pallor.  ENMT: Moist mucous membranes noted.  Posterior pharynx clear of any exudate or lesions.   Respiratory: clear to auscultation bilaterally, no wheezing, no crackles. Normal respiratory effort. No accessory muscle use.  Cardiovascular: Regular rate and rhythm, no murmurs / rubs / gallops. No extremity edema. 2+ pedal pulses. No carotid bruits.  Abdomen: Abdomen is soft and nontender.  No evidence of intra-abdominal masses.  Positive bowel sounds noted in all quadrants.   Musculoskeletal: Left thigh wound VAC in place with bloody drainage.  No joint deformity upper and lower extremities. Good ROM, no contractures. Normal muscle tone.    Data Reviewed:  I have personally reviewed and interpreted labs, imaging.  Significant findings are   CBC: Recent Labs  Lab 06/07/22 0324 06/08/22 0334 06/09/22 0334 06/10/22 0348 06/11/22 0319  WBC 5.5 9.2 6.3 5.5 5.9  HGB 11.5* 8.3* 7.9* 9.2* 8.9*  HCT 34.9* 25.8* 24.7* 29.0* 27.5*  MCV 87.7 89.9 90.5 90.9 91.4  PLT 317 307 282 334 316   Basic Metabolic Panel: Recent Labs  Lab 06/06/22 0849 06/06/22 1016 06/07/22 0324 06/08/22 0334 06/09/22 0334 06/10/22 0348 06/11/22 0319  NA 139   < > 138 137 138 140 139  K 2.9*   < > 3.7 3.7 3.4* 3.5 3.6  CL 106   < > 105 105 106 104 101  CO2 22   < > 23 24 25 28 27   GLUCOSE 85   < > 94 121* 114* 98 98  BUN 13   < > 14 13 15 13 17   CREATININE 0.78   < > 0.85 0.78 0.66 0.56 0.57  CALCIUM 8.2*   < > 8.6* 8.5* 8.2* 9.0 9.1  MG 1.8  --   --   --   --   --  2.0   < > = values in this interval not displayed.   GFR: Estimated Creatinine Clearance: 65.2 mL/min (by C-G formula based on SCr of 0.57 mg/dL). Liver Function Tests: Recent Labs  Lab 06/06/22 1016  AST 13*  ALT 9  ALKPHOS 54  BILITOT 0.6  PROT 6.3*  ALBUMIN 2.5*    Coagulation Profile: Recent Labs  Lab 06/11/22 0319  INR 0.9      Code Status:  Full code.     Time spent:   35 minutes  Author:  Marinda Elk MD  06/11/2022 8:07 PM

## 2022-06-11 NOTE — Progress Notes (Signed)
    Subjective:  Patient reports pain as mild to moderate.  Denies N/V/CP/SOB/Abd pain. She states she is doing well today and her pain is better. Denies any tingling or numbness in LE bilaterally. Calves soft and non-tender.   Objective:   VITALS:   Vitals:   06/10/22 1323 06/10/22 2126 06/11/22 0535 06/11/22 1323  BP: 130/62 (!) 148/76 (!) 157/74 (!) 161/77  Pulse: 68 63 63 86  Resp: 18 17 18 17   Temp: 99 F (37.2 C) 98.4 F (36.9 C) 98.1 F (36.7 C) 98.3 F (36.8 C)  TempSrc: Oral  Oral   SpO2: 96% 95% 97% 99%  Weight:      Height:        Patient sitting up in bed. NAD.  Neurologically intact ABD soft Neurovascular intact Sensation intact distally Intact pulses distally Dorsiflexion/Plantar flexion intact No cellulitis present Compartment soft Negative pressure dressing C/D/I. No leaks detected. Wound vac . 75 cc SS output, 350 total today.  Hemovac drain dressing C/D/I. 30 cc bloody fluid. Continue drain.  Knee immobilizer on.   Lab Results  Component Value Date   WBC 5.9 06/11/2022   HGB 8.9 (L) 06/11/2022   HCT 27.5 (L) 06/11/2022   MCV 91.4 06/11/2022   PLT 316 06/11/2022   BMET    Component Value Date/Time   NA 139 06/11/2022 0319   K 3.6 06/11/2022 0319   CL 101 06/11/2022 0319   CO2 27 06/11/2022 0319   GLUCOSE 98 06/11/2022 0319   BUN 17 06/11/2022 0319   CREATININE 0.57 06/11/2022 0319   CREATININE 0.76 07/10/2020 1220   CALCIUM 9.1 06/11/2022 0319   GFRNONAA >60 06/11/2022 0319   GFRNONAA >60 07/10/2020 1220     Assessment/Plan: 4 Days Post-Op   Principal Problem:   Surgical wound dehiscence, initial encounter Active Problems:   Hypertension   Bipolar disorder, unspecified (HCC)   Malignant neoplasm of upper-outer quadrant of left breast in female, estrogen receptor positive (HCC)   History of CVA (cerebrovascular accident)   Acute postoperative anemia due to expected blood loss   Hypokalemia   Chronic hepatitis C with  cirrhosis (HCC)   WBAT with walker DVT ppx: Lovenox, SCDs, TEDS PO pain control PT/OT: Patient ambulated 50 feet with PT yesterday. Continue PT today.  Dispo:  - Nutrition consult completed.  [facility] consulting for medical management of patient. Appreciate their assistance.  - Continue wound vac and hemovac drain. - Dr. Ulice Bold with plastic surgery, plans to take the patient back to the OR 06/15/22.  - Continue to follow intraoperative cultures.  - Infectious disease consulted yesterday. IV antibiotics adjusted to amp-sulbactam. She will need a PICC line for long-term IV antibiotics for at least 6 weeks, with transition to amoxicillin for chronic suppression per ID   Clois Dupes, PA-C 06/11/2022, 4:01 PM  [facility]  [facility] [address], [place], [place] [address] Phone: [phone] [email or web address] Facebook  [employer]

## 2022-06-11 NOTE — Progress Notes (Signed)
Physical Therapy Treatment Patient Details Name: Tammy [name] MRN: [id number] DOB: March 31, 1955 Today's Date: 06/11/2022   History of Present Illness 67 y.o. 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 She is s/p left distal femur ORIF fracture 04/10/21 with Dr. 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 s/p removal of implant and replacement of L distal femur 04/30/22. Now admitted 06/04/22 with necrotic wound s/p surgical debridement. And s/p repeat debridement with exchange of rotating hinge bearing 06/07/22.  PMH: bipolar, CVA.    PT Comments    Pt is progressing well with mobility, she tolerated increased ambulation distance of 36' with RW and L KI, no loss of balance.    Recommendations for follow up therapy are one component of a multi-disciplinary discharge planning process, led by the attending physician.  Recommendations may be updated based on patient status, additional functional criteria and insurance authorization.  Follow Up Recommendations       Assistance Recommended at Discharge Intermittent Supervision/Assistance  Patient can return home with the following A little help with bathing/dressing/bathroom;Assist for transportation;Help with stairs or ramp for entrance;Assistance with cooking/housework;A little help with walking and/or transfers   Equipment Recommendations  None recommended by PT    Recommendations for Other Services       Precautions / Restrictions Precautions Precautions: Fall Precaution Comments: wound vac L knee; KI all times, no L knee flexion Required Braces or Orthoses: Knee Immobilizer - Left Knee Immobilizer - Left: On at all times Restrictions Weight Bearing Restrictions: No LLE Weight  Bearing: Weight bearing as tolerated     Mobility  Bed Mobility Overal bed mobility: Needs Assistance Bed Mobility: Supine to Sit     Supine to sit: HOB elevated, Supervision     General bed mobility comments: pt used gait belt as LLE lifter    Transfers Overall transfer level: Needs assistance Equipment used: Rolling walker (2 wheels) Transfers: Sit to/from Stand Sit to Stand: Supervision   Step pivot transfers: Min guard       General transfer comment: VCs hand placement, steady, no loss of balance, KI on LLE    Ambulation/Gait Ambulation/Gait assistance: Supervision Gait Distance (Feet): 60 Feet Assistive device: Rolling walker (2 wheels) Gait Pattern/deviations: Step-to pattern, Decreased stance time - left, Antalgic Gait velocity: decr     General Gait Details: KI on L knee, steady, no loss of balance   Stairs             Wheelchair Mobility    Modified Rankin (Stroke Patients Only)       Balance Overall balance assessment: Needs assistance   Sitting balance-Leahy Scale: Good     Standing balance support: Bilateral upper extremity supported, During functional activity, Reliant on assistive device for balance Standing balance-Leahy Scale: Poor                              Cognition Arousal/Alertness: Awake/alert Behavior During Therapy: WFL for tasks assessed/performed Overall Cognitive Status: Within Functional Limits for tasks assessed  Exercises      General Comments        Pertinent Vitals/Pain Pain Assessment Pain Score: 2  Pain Location: L knee with walking Pain Descriptors / Indicators: Sore Pain Intervention(s): Limited activity within patient's tolerance, Monitored during session, Premedicated before session, Ice applied    Home Living                          Prior Function            PT Goals (current goals can now be found in the  care plan section) Acute Rehab PT Goals Patient Stated Goal: go to [place], get married without using a walker PT Goal Formulation: With patient Time For Goal Achievement: 05/08/22 Potential to Achieve Goals: Good Progress towards PT goals: Progressing toward goals    Frequency    Min 1X/week      PT Plan Current plan remains appropriate    Co-evaluation              AM-PAC PT "6 Clicks" Mobility   Outcome Measure  Help needed turning from your back to your side while in a flat bed without using bedrails?: None Help needed moving from lying on your back to sitting on the side of a flat bed without using bedrails?: A Little Help needed moving to and from a bed to a chair (including a wheelchair)?: A Little Help needed standing up from a chair using your arms (e.g., wheelchair or bedside chair)?: A Little Help needed to walk in hospital room?: A Lot Help needed climbing 3-5 steps with a railing? : A Little 6 Click Score: 18    End of Session Equipment Utilized During Treatment: Gait belt Activity Tolerance: Patient tolerated treatment well Patient left: in chair;with call bell/phone within reach;with chair alarm set Nurse Communication: Mobility status PT Visit Diagnosis: Difficulty in walking, not elsewhere classified (R26.2);Other abnormalities of gait and mobility (R26.89) Pain - Right/Left: Left Pain - part of body: Knee     Time: 1610-9604 PT Time Calculation (min) (ACUTE ONLY): 25 min  Charges:  $Gait Training: 8-22 mins $Therapeutic Activity: 8-22 mins                     Ralene Bathe Kistler PT 06/11/2022  Acute Rehabilitation Services  Office [phone]

## 2022-06-12 ENCOUNTER — Other Ambulatory Visit: Payer: Self-pay

## 2022-06-12 DIAGNOSIS — M869 Osteomyelitis, unspecified: Secondary | ICD-10-CM

## 2022-06-12 DIAGNOSIS — T847XXD Infection and inflammatory reaction due to other internal orthopedic prosthetic devices, implants and grafts, subsequent encounter: Secondary | ICD-10-CM | POA: Diagnosis not present

## 2022-06-12 DIAGNOSIS — T8131XA Disruption of external operation (surgical) wound, not elsewhere classified, initial encounter: Secondary | ICD-10-CM | POA: Diagnosis not present

## 2022-06-12 LAB — CBC
HCT: 25.3 % — ABNORMAL LOW (ref 36.0–46.0)
Hemoglobin: 8.1 g/dL — ABNORMAL LOW (ref 12.0–15.0)
MCH: 29.6 pg (ref 26.0–34.0)
MCHC: 32 g/dL (ref 30.0–36.0)
MCV: 92.3 fL (ref 80.0–100.0)
Platelets: 325 10*3/uL (ref 150–400)
RBC: 2.74 MIL/uL — ABNORMAL LOW (ref 3.87–5.11)
RDW: 16.5 % — ABNORMAL HIGH (ref 11.5–15.5)
WBC: 5.6 10*3/uL (ref 4.0–10.5)
nRBC: 0 % (ref 0.0–0.2)

## 2022-06-12 LAB — BASIC METABOLIC PANEL
Anion gap: 10 (ref 5–15)
BUN: 16 mg/dL (ref 8–23)
CO2: 27 mmol/L (ref 22–32)
Calcium: 8.7 mg/dL — ABNORMAL LOW (ref 8.9–10.3)
Chloride: 102 mmol/L (ref 98–111)
Creatinine, Ser: 0.71 mg/dL (ref 0.44–1.00)
GFR, Estimated: >60 mL/min (ref >60)
Glucose, Bld: 100 mg/dL — ABNORMAL HIGH (ref 70–99)
Potassium: 3.2 mmol/L — ABNORMAL LOW (ref 3.5–5.1)
Sodium: 139 mmol/L (ref 135–145)

## 2022-06-12 LAB — AEROBIC/ANAEROBIC CULTURE W GRAM STAIN (SURGICAL/DEEP WOUND)

## 2022-06-12 MED ORDER — LOSARTAN POTASSIUM 50 MG PO TABS
50.0000 mg | ORAL_TABLET | Freq: Every day | ORAL | Status: DC
  Administered 2022-06-13 – 2022-06-18 (×5): 50 mg via ORAL
  Filled 2022-06-12 (×5): qty 1

## 2022-06-12 MED ORDER — POTASSIUM CHLORIDE CRYS ER 20 MEQ PO TBCR
40.0000 meq | EXTENDED_RELEASE_TABLET | Freq: Once | ORAL | Status: AC
  Administered 2022-06-12: 40 meq via ORAL
  Filled 2022-06-12: qty 2

## 2022-06-12 MED ORDER — SODIUM CHLORIDE 0.9% FLUSH: 10.0000 mL | INTRAVENOUS | Status: DC | PRN

## 2022-06-12 MED ORDER — CHLORHEXIDINE GLUCONATE CLOTH 2 % EX PADS
6.0000 | MEDICATED_PAD | Freq: Every day | CUTANEOUS | Status: DC
  Administered 2022-06-13 – 2022-06-17 (×5): 6 via TOPICAL

## 2022-06-12 NOTE — Progress Notes (Addendum)
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 [name] for multistage operative repair of left distal femur wound.  She fell and broke her distal femur last year, was taken to the OR on 04/10/2021 by Dr. Carola Frost for ORIF, left distal femur with intercondylar extension.  She then underwent hardware removal and distal femur replacement on 04/30/2022.  She has been readmitted by orthopedic surgery on 4/25 due to wound dehiscence of surgical site.  She was taken for debridement and washout with placement of wound VAC on 4/25.   Dr. [name] plans larger reconstruction surgery with the assistance of plastic surgery during this hospital stay group has been consulted for assistance with electrolyte management, management of hypertension and ensuring patient readiness for surgery.   Assessment and Plan: * Surgical wound dehiscence, initial encounter Continued management per primary team and infectious disease Wound VAC in place Intraoperative cultures on 4/25 and 4/28 both growing out Enterococcus faecalis Continuing intravenous antibiotics.  Patient has been transition to intravenous ampicillin based on recent sensitivities with anticipation of total of 6 weeks of antibiotic therapy ending on 07/19/2022 I will proceed with ordering the PICC line today Dr. Ulice Bold with plastic surgery who is planning to take patient back to the operating room on 06/15/2022  Hypertension Concern for possible secondary cause of hypertension such as primary aldosteronism considering concurrent hypokalemia Following workup -aldosterone and renin activity with ratio still pending Will additionally  obtain ultrasound of the renal arteries to evaluate for any evidence of renal artery stenosis. Continuing hydralazine to 25 mg 3 times daily.  Continuing amlodipine 10 mg daily Increasing losartan to 50 mg mg daily.  As needed intravenous hydralazine for markedly elevated blood pressure  Hypokalemia Recurrent Replacing with oral potassium chloride Continue to monitor potassium and magnesium levels closely and replacing as necessary Possible association with primary aldosteronism as mentioned above  Acute postoperative anemia due to expected blood loss Likely multifactorial  Slight downtrend of hemoglobin Iron panel revealing low iron saturation with borderline iron level.   Dose of intravenous Ferrlecit given on 5/2  Continue to monitor hemoglobin and hematocrit, transfuse if hemoglobin less than 7 or evidence of active bleeding  Chronic hepatitis C with cirrhosis (HCC) Known history of hepatitis C cirrhosis per review of prior records No sequela of advanced liver disease INR performed today 0.9 consistent with good synthetic function of the liver  Malignant neoplasm of upper-outer quadrant of left breast in female, estrogen receptor positive (HCC) Continue Arimidex   Subjective:  Patient complaining of left thigh pain, sore in quality, radiating distally, worse with movement and improved with rest.  Patient denies any associated weakness or poor appetite.   Physical Exam:  Vitals:   06/11/22 2146 06/12/22 0514 06/12/22 0945 06/12/22 1325  BP: 139/63 (!) 155/77 (!) 145/68 (!) 140/65  Pulse: 65 64 62 72  Resp: 18 17 18 16   Temp: 98.1 F (36.7 C) 98.2 F (36.8 C) 98.3 F (36.8 C) 98.5 F (36.9 C)  TempSrc:   Oral   SpO2: 100% 96% 99% 95%  Weight:      Height:         Constitutional: Awake alert and oriented x3, no associated distress.  Skin: Left thigh dressing unchanged with wound VAC in place with somewhat bloody drainage.   Eyes: Pupils are equally reactive to  light.  No evidence of scleral icterus or conjunctival pallor.  ENMT: Moist mucous membranes noted.  Posterior pharynx clear of any exudate or lesions.   Respiratory: clear to auscultation bilaterally, no wheezing, no crackles. Normal respiratory effort. No accessory muscle use.  Cardiovascular: Regular rate and rhythm, no murmurs / rubs / gallops. No extremity edema. 2+ pedal pulses. No carotid bruits.  Abdomen: Abdomen is soft and nontender.  No evidence of intra-abdominal masses.  Positive bowel sounds noted in all quadrants.   Musculoskeletal: Left thigh wound VAC in place with bloody drainage.  No joint deformity upper and lower extremities. Good ROM, no contractures. Normal muscle tone.    Data Reviewed:  I have personally reviewed and interpreted labs, imaging.  Significant findings are   CBC: Recent Labs  Lab 06/08/22 0334 06/09/22 0334 06/10/22 0348 06/11/22 0319 06/12/22 0317  WBC 9.2 6.3 5.5 5.9 5.6  HGB 8.3* 7.9* 9.2* 8.9* 8.1*  HCT 25.8* 24.7* 29.0* 27.5* 25.3*  MCV 89.9 90.5 90.9 91.4 92.3  PLT 307 282 334 316 325   Basic Metabolic Panel: Recent Labs  Lab 06/06/22 0849 06/06/22 1016 06/08/22 0334 06/09/22 0334 06/10/22 0348 06/11/22 0319 06/12/22 0317  NA 139   < > 137 138 140 139 139  K 2.9*   < > 3.7 3.4* 3.5 3.6 3.2*  CL 106   < > 105 106 104 101 102  CO2 22   < > 24 25 28 27 27   GLUCOSE 85   < > 121* 114* 98 98 100*  BUN 13   < > 13 15 13 17 16   CREATININE 0.78   < > 0.78 0.66 0.56 0.57 0.71  CALCIUM 8.2*   < > 8.5* 8.2* 9.0 9.1 8.7*  MG 1.8  --   --   --   --  2.0  --    < > = values in this interval not displayed.   GFR: Estimated Creatinine Clearance: 65.2 mL/min (by C-G formula based on SCr of 0.71 mg/dL). Liver Function Tests: Recent Labs  Lab 06/06/22 1016  AST 13*  ALT 9  ALKPHOS 54  BILITOT 0.6  PROT 6.3*  ALBUMIN 2.5*    Coagulation Profile: Recent Labs  Lab 06/11/22 0319  INR 0.9      Code Status:  Full code.      Time spent:  50 minutes  Author:  Marinda Elk MD  06/12/2022 9:09 PM

## 2022-06-12 NOTE — Progress Notes (Signed)
Hemovac drain removed today. Dry dressing placed over insertion site.

## 2022-06-12 NOTE — Plan of Care (Signed)
  Problem: Education: Goal: Knowledge of General Education information will improve Description Including pain rating scale, medication(s)/side effects and non-pharmacologic comfort measures Outcome: Progressing   

## 2022-06-12 NOTE — Progress Notes (Signed)
    Subjective:  Patient reports pain as mild to moderate.  Denies N/V/CP/SOB/Abd pain. She states she is doing well today. She states she is continuing to do well with PT and she is requiring less medication overnight.   Objective:   VITALS:   Vitals:   06/11/22 2146 06/12/22 0514 06/12/22 0945 06/12/22 1325  BP: 139/63 (!) 155/77 (!) 145/68 (!) 140/65  Pulse: 65 64 62 72  Resp: 18 17 18 16   Temp: 98.1 F (36.7 C) 98.2 F (36.8 C) 98.3 F (36.8 C) 98.5 F (36.9 C)  TempSrc:   Oral   SpO2: 100% 96% 99% 95%  Weight:      Height:        Patient sitting up in bed. NAD.  Neurologically intact ABD soft Neurovascular intact Sensation intact distally Intact pulses distally Dorsiflexion/Plantar flexion intact No cellulitis present Compartment soft Negative pressure dressing C/D/I. No leaks detected. Wound vac . 50 cc SS output, 400 total today.  Hemovac drain dressing C/D/I. 35 cc bloody fluid. Continue drain.  Knee immobilizer on.   Lab Results  Component Value Date   WBC 5.6 06/12/2022   HGB 8.1 (L) 06/12/2022   HCT 25.3 (L) 06/12/2022   MCV 92.3 06/12/2022   PLT 325 06/12/2022   BMET    Component Value Date/Time   NA 139 06/12/2022 0317   K 3.2 (L) 06/12/2022 0317   CL 102 06/12/2022 0317   CO2 27 06/12/2022 0317   GLUCOSE 100 (H) 06/12/2022 0317   BUN 16 06/12/2022 0317   CREATININE 0.71 06/12/2022 0317   CREATININE 0.76 07/10/2020 1220   CALCIUM 8.7 (L) 06/12/2022 0317   GFRNONAA >60 06/12/2022 0317   GFRNONAA >60 07/10/2020 1220     Assessment/Plan: 5 Days Post-Op   Principal Problem:   Surgical wound dehiscence, initial encounter Active Problems:   Hypertension   Bipolar disorder, unspecified (HCC)   Malignant neoplasm of upper-outer quadrant of left breast in female, estrogen receptor positive (HCC)   History of CVA (cerebrovascular accident)   Acute postoperative anemia due to expected blood loss   Hypokalemia   Chronic hepatitis C  with cirrhosis (HCC)  Hemoglobin 8.1. Continue to monitor.   WBAT with walker DVT ppx: Lovenox, SCDs, TEDS PO pain control PT/OT: Patient ambulated 60 feet with PT yesterday.  Dispo:  - Nutrition consult completed.  [facility] consulting for medical management of patient. Appreciate their assistance.  - Continue wound vac and hemovac drain. - Dr. Ulice Bold with plastic surgery, plans to take the patient back to the OR 06/15/22.  - Infectious disease  recommends ampicillin 12g IV for 6 weeks. She'll need long term suppressive amoxicillin after 6 weeks of ampicillin, and can start clock on 4/28 .  She will need a PICC line for long-term IV antibiotics.   Clois Dupes, PA-C 06/12/2022, 7:17 PM   [facility]  [facility] [place] [address], [address], [place], [place] [address] Phone: [phone] [email or web address] Facebook  [employer]

## 2022-06-12 NOTE — Progress Notes (Signed)
Peripherally Inserted Central Catheter Placement  The IV Nurse has discussed with the patient and/or persons authorized to consent for the patient, the purpose of this procedure and the potential benefits and risks involved with this procedure.  The benefits include less needle sticks, lab draws from the catheter, and the patient may be discharged home with the catheter. Risks include, but not limited to, infection, bleeding, blood clot (thrombus formation), and puncture of an artery; nerve damage and irregular heartbeat and possibility to perform a PICC exchange if needed/ordered by physician.  Alternatives to this procedure were also discussed.  Bard Power PICC patient education guide, fact sheet on infection prevention and patient information card has been provided to patient /or left at bedside.    PICC Placement Documentation  PICC Single Lumen 06/12/22 Right Basilic 37 cm 0 cm (Active)  Indication for Insertion or Continuance of Line Home intravenous therapies (PICC only);Prolonged intravenous therapies 06/12/22 2213  Exposed Catheter (cm) 0 cm 06/12/22 2213  Site Assessment Clean, Dry, Intact 06/12/22 2213  Line Status Flushed;Saline locked;Blood return noted 06/12/22 2213  Dressing Type Transparent;Securing device 06/12/22 2213  Dressing Status Antimicrobial disc in place;Clean, Dry, Intact 06/12/22 2213  Safety Lock Not Applicable 06/12/22 2213  Line Care Connections checked and tightened 06/12/22 2213  Dressing Intervention New dressing 06/12/22 2213  Dressing Change Due 06/19/22 06/12/22 2213       Trenese Haft, Lajean Manes 06/12/2022, 10:13 PM

## 2022-06-12 NOTE — Progress Notes (Signed)
Physical Therapy Treatment Patient Details Name: Tammy [name] MRN: [id number] DOB: 24-Nov-1955 Today's Date: 06/12/2022   History of Present Illness 67 y.o. 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 She is s/p left distal femur ORIF fracture 04/10/21 with Dr. 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 s/p removal of implant and replacement of L distal femur 04/30/22. Now admitted 06/04/22 with necrotic wound s/p surgical debridement. And s/p repeat debridement with exchange of rotating hinge bearing 06/07/22.  PMH: bipolar, CVA.    PT Comments    Pt is progressing well with mobility, she tolerated increased ambulation distance of 16' with RW. Pt is performing HEP independently.    Recommendations for follow up therapy are one component of a multi-disciplinary discharge planning process, led by the attending physician.  Recommendations may be updated based on patient status, additional functional criteria and insurance authorization.  Follow Up Recommendations       Assistance Recommended at Discharge Intermittent Supervision/Assistance  Patient can return home with the following A little help with bathing/dressing/bathroom;Assist for transportation;Help with stairs or ramp for entrance;Assistance with cooking/housework;A little help with walking and/or transfers   Equipment Recommendations  None recommended by PT    Recommendations for Other Services       Precautions / Restrictions Precautions Precautions: Fall Precaution Comments: wound vac L knee; KI all times, no L knee flexion Required Braces or Orthoses: Knee Immobilizer - Left Knee Immobilizer - Left: On at all times Restrictions Weight Bearing Restrictions: No LLE  Weight Bearing: Weight bearing as tolerated     Mobility  Bed Mobility Overal bed mobility: Modified Independent Bed Mobility: Supine to Sit     Supine to sit: HOB elevated, Modified independent (Device/Increase time)     General bed mobility comments: pt used gait belt as LLE lifter    Transfers Overall transfer level: Needs assistance Equipment used: Rolling walker (2 wheels) Transfers: Sit to/from Stand Sit to Stand: Supervision           General transfer comment: steady, no loss of balance, KI on LLE    Ambulation/Gait Ambulation/Gait assistance: Supervision Gait Distance (Feet): 90 Feet Assistive device: Rolling walker (2 wheels) Gait Pattern/deviations: Step-to pattern, Decreased stance time - left, Antalgic Gait velocity: decr     General Gait Details: KI on L knee, steady, no loss of balance   Stairs             Wheelchair Mobility    Modified Rankin (Stroke Patients Only)       Balance                                            Cognition Arousal/Alertness: Awake/alert Behavior During Therapy: WFL for tasks assessed/performed Overall Cognitive Status: Within Functional Limits for tasks assessed                                          Exercises      General Comments        Pertinent Vitals/Pain Pain Assessment Pain Score:  4  Pain Location: L knee with walking Pain Descriptors / Indicators: Sore Pain Intervention(s): Limited activity within patient's tolerance, Monitored during session, Premedicated before session    Home Living                          Prior Function            PT Goals (current goals can now be found in the care plan section) Acute Rehab PT Goals Patient Stated Goal: go to [place], get married without using a walker PT Goal Formulation: With patient Time For Goal Achievement: 05/08/22 Potential to Achieve Goals: Good Progress towards PT goals: Progressing  toward goals    Frequency    Min 1X/week      PT Plan Current plan remains appropriate    Co-evaluation              AM-PAC PT "6 Clicks" Mobility   Outcome Measure  Help needed turning from your back to your side while in a flat bed without using bedrails?: None Help needed moving from lying on your back to sitting on the side of a flat bed without using bedrails?: A Little Help needed moving to and from a bed to a chair (including a wheelchair)?: A Little Help needed standing up from a chair using your arms (e.g., wheelchair or bedside chair)?: A Little Help needed to walk in hospital room?: A Little Help needed climbing 3-5 steps with a railing? : A Little 6 Click Score: 19    End of Session Equipment Utilized During Treatment: Gait belt Activity Tolerance: Patient tolerated treatment well Patient left: in chair;with call bell/phone within reach;with chair alarm set Nurse Communication: Mobility status PT Visit Diagnosis: Difficulty in walking, not elsewhere classified (R26.2);Other abnormalities of gait and mobility (R26.89) Pain - Right/Left: Left Pain - part of body: Knee     Time: 1610-9604 PT Time Calculation (min) (ACUTE ONLY): 22 min  Charges:  $Gait Training: 8-22 mins                     Ralene Bathe Kistler PT 06/12/2022  Acute Rehabilitation Services  Office [phone]

## 2022-06-13 DIAGNOSIS — D62 Acute posthemorrhagic anemia: Secondary | ICD-10-CM | POA: Diagnosis not present

## 2022-06-13 DIAGNOSIS — E876 Hypokalemia: Secondary | ICD-10-CM | POA: Diagnosis not present

## 2022-06-13 DIAGNOSIS — I10 Essential (primary) hypertension: Secondary | ICD-10-CM | POA: Diagnosis not present

## 2022-06-13 DIAGNOSIS — T8131XA Disruption of external operation (surgical) wound, not elsewhere classified, initial encounter: Secondary | ICD-10-CM | POA: Diagnosis not present

## 2022-06-13 LAB — CBC
HCT: 26.9 % — ABNORMAL LOW (ref 36.0–46.0)
Hemoglobin: 8.3 g/dL — ABNORMAL LOW (ref 12.0–15.0)
MCH: 28.5 pg (ref 26.0–34.0)
MCHC: 30.9 g/dL (ref 30.0–36.0)
MCV: 92.4 fL (ref 80.0–100.0)
Platelets: 326 10*3/uL (ref 150–400)
RBC: 2.91 MIL/uL — ABNORMAL LOW (ref 3.87–5.11)
RDW: 16.5 % — ABNORMAL HIGH (ref 11.5–15.5)
WBC: 5.6 10*3/uL (ref 4.0–10.5)
nRBC: 0 % (ref 0.0–0.2)

## 2022-06-13 LAB — BASIC METABOLIC PANEL
Anion gap: 9 (ref 5–15)
BUN: 21 mg/dL (ref 8–23)
CO2: 25 mmol/L (ref 22–32)
Calcium: 9 mg/dL (ref 8.9–10.3)
Chloride: 101 mmol/L (ref 98–111)
Creatinine, Ser: 0.65 mg/dL (ref 0.44–1.00)
GFR, Estimated: >60 mL/min (ref >60)
Glucose, Bld: 108 mg/dL — ABNORMAL HIGH (ref 70–99)
Potassium: 3.6 mmol/L (ref 3.5–5.1)
Sodium: 135 mmol/L (ref 135–145)

## 2022-06-13 LAB — MAGNESIUM: Magnesium: 2.1 mg/dL (ref 1.7–2.4)

## 2022-06-13 MED ORDER — SODIUM CHLORIDE 0.9 % IV SOLN
250.0000 mg | Freq: Once | INTRAVENOUS | Status: AC
  Administered 2022-06-13: 250 mg via INTRAVENOUS
  Filled 2022-06-13: qty 20

## 2022-06-13 NOTE — Progress Notes (Signed)
Physical Therapy Treatment Patient Details Name: Tammy [name] MRN: [id number] DOB: [birth year] Today's Date: 06/13/2022   History of Present Illness 67 y.o. 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 She is s/p left distal femur ORIF fracture 04/10/21 with Dr. 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 s/p removal of implant and replacement of L distal femur 04/30/22. Now admitted 06/04/22 with necrotic wound s/p surgical debridement. And s/p repeat debridement with exchange of rotating hinge bearing 06/07/22.  PMH: bipolar, CVA.    PT Comments    AxO x 3 very pleasant and motivated.  KI posterior rods "cutting"  into her leg.  Applied a folded towel for cushion/comfort.  Pt aware to wear KI all times when OOB.  Assisted OOB to amb to bathroom then in hallway.  Pt doing well using gait belt to lift L LE on/off bed as well as in/our recliner/toilet.  Amb a functional distance with walker.  Positioned in recliner to comfort.  Applied ICE. Scheduled for surgery Monday for a skin graft then hopefully D/C to home a couple of days after.     Recommendations for follow up therapy are one component of a multi-disciplinary discharge planning process, led by the attending physician.  Recommendations may be updated based on patient status, additional functional criteria and insurance authorization.  Follow Up Recommendations       Assistance Recommended at Discharge Intermittent Supervision/Assistance  Patient can return home with the following A little help with bathing/dressing/bathroom;Assist for transportation;Help with stairs or ramp for entrance;Assistance with cooking/housework;A little help with walking and/or transfers   Equipment  Recommendations  None recommended by PT    Recommendations for Other Services       Precautions / Restrictions Precautions Precautions: Fall Precaution Comments: wound vac L knee; KI all times, no L knee flexion Required Braces or Orthoses: Knee Immobilizer - Left Knee Immobilizer - Left: On at all times Restrictions Weight Bearing Restrictions: No LLE Weight Bearing: Weight bearing as tolerated     Mobility  Bed Mobility Overal bed mobility: Modified Independent Bed Mobility: Supine to Sit           General bed mobility comments: pt used gait belt as LLE lifter    Transfers Overall transfer level: Needs assistance Equipment used: Rolling walker (2 wheels) Transfers: Sit to/from Stand Sit to Stand: Supervision           General transfer comment: also assisted with a toilet transfer at Supervision level.  Pt self able to perform peri care.    Ambulation/Gait Ambulation/Gait assistance: Supervision Gait Distance (Feet): 85 Feet   Gait Pattern/deviations: Step-to pattern, Decreased stance time - left, Antalgic Gait velocity: decr     General Gait Details: KI on L knee, steady, no loss of balance.  Functional distance.   Stairs             Wheelchair Mobility    Modified Rankin (Stroke Patients Only)       Balance                                            Cognition Arousal/Alertness: Awake/alert Behavior  During Therapy: WFL for tasks assessed/performed Overall Cognitive Status: Within Functional Limits for tasks assessed                                 General Comments: AxO x 3 very pleasant        Exercises      General Comments        Pertinent Vitals/Pain Pain Assessment Pain Assessment: Faces Faces Pain Scale: Hurts a little bit Pain Location: L knee with walking Pain Descriptors / Indicators: Sore, Aching, Operative site guarding Pain Intervention(s): Monitored during session, Premedicated  before session, Repositioned, Ice applied    Home Living                          Prior Function            PT Goals (current goals can now be found in the care plan section) Progress towards PT goals: Progressing toward goals    Frequency    Min 1X/week      PT Plan Current plan remains appropriate    Co-evaluation              AM-PAC PT "6 Clicks" Mobility   Outcome Measure  Help needed turning from your back to your side while in a flat bed without using bedrails?: None Help needed moving from lying on your back to sitting on the side of a flat bed without using bedrails?: None Help needed moving to and from a bed to a chair (including a wheelchair)?: None Help needed standing up from a chair using your arms (e.g., wheelchair or bedside chair)?: None Help needed to walk in hospital room?: A Little Help needed climbing 3-5 steps with a railing? : A Little 6 Click Score: 22    End of Session Equipment Utilized During Treatment: Gait belt Activity Tolerance: Patient tolerated treatment well Patient left: in chair;with call bell/phone within reach;with chair alarm set Nurse Communication: Mobility status PT Visit Diagnosis: Difficulty in walking, not elsewhere classified (R26.2);Other abnormalities of gait and mobility (R26.89) Pain - Right/Left: Left Pain - part of body: Knee     Time: 1610-9604 PT Time Calculation (min) (ACUTE ONLY): 27 min  Charges:  $Gait Training: 8-22 mins $Therapeutic Activity: 8-22 mins                     [name]  PTA Acute  Rehabilitation Services Office M-F          [phone]

## 2022-06-13 NOTE — Progress Notes (Signed)
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 [name] for multistage operative repair of left distal femur wound.  She fell and broke her distal femur last year, was taken to the OR on 04/10/2021 by Dr. Carola Frost for ORIF, left distal femur with intercondylar extension.  She then underwent hardware removal and distal femur replacement on 04/30/2022.  She has been readmitted by orthopedic surgery on 4/25 due to wound dehiscence of surgical site.  She was taken for debridement and washout with placement of wound VAC on 4/25.   Dr. [name] plans larger reconstruction surgery with the assistance of plastic surgery during this hospital stay group has been consulted for assistance with electrolyte management, management of hypertension and ensuring patient readiness for surgery.   Assessment and Plan: * Surgical wound dehiscence, initial encounter Continued management per primary team and infectious disease Wound VAC in place Intraoperative cultures on 4/25 and 4/28 both growing out Enterococcus faecalis Continuing intravenous antibiotics.  Patient has been transition to intravenous ampicillin based on recent sensitivities with anticipation of total of 6 weeks of antibiotic therapy ending on 07/19/2022 PICC line placed 5/3 Dr. Ulice Bold with plastic surgery who is planning to take patient back to the operating room on 06/15/2022  Hypertension Improved antihypertensive control Concern for possible secondary cause of hypertension such as primary aldosteronism considering concurrent hypokalemia Following aldosterone and renin activity with ratio still pending Continuing hydralazine  to 25 mg 3 times daily.  Continuing amlodipine 10 mg daily Losartan to 50 mg mg daily.  As needed intravenous hydralazine for markedly elevated blood pressure  Hypokalemia Improved On gradual low-dose potassium chloride although ideally it would be best if we could avoid this Continue to monitor potassium and magnesium levels closely and replacing as necessary Possible association with primary aldosteronism as mentioned above.  If blood testing confirms the diagnosis will add spironolactone  Acute postoperative anemia due to expected blood loss Likely multifactorial  Hemoglobin has stabilized. Iron panel revealing low iron saturation with borderline iron level.   Dose of intravenous Ferrlecit given on 5/2.  Giving additional dose today. Continue to monitor hemoglobin and hematocrit, transfuse if hemoglobin less than 7 or evidence of active bleeding  Chronic hepatitis C with cirrhosis (HCC) Known history of hepatitis C cirrhosis per review of prior records No sequela of advanced liver disease INR performed today 0.9 consistent with good synthetic function of the liver  Malignant neoplasm of upper-outer quadrant of left breast in female, estrogen receptor positive (HCC) Continue Arimidex   Subjective:  Reports left thigh pain is improving.  Pain is mild in intensity, nonradiating, sore in quality, worse with movement.  Wound vac has been removed.  Physical Exam:  Vitals:   06/12/22 1325 06/12/22 2316 06/13/22 0505 06/13/22 1339  BP: (!) 140/65 (!) 146/62 (!) 153/67 138/64  Pulse: 72 69 (!) 59 74  Resp: 16 18 18 16   Temp: 98.5 F (36.9 C) 98.9 F (37.2 C) 98.1 F (36.7 C) 99.4 F (37.4 C)  TempSrc:  Oral Oral Oral  SpO2: 95% 100% 100% 99%  Weight:      Height:         Constitutional: Awake alert and oriented x3, no associated  distress.   Skin: Left thigh dressing unchanged  Eyes: Pupils are equally reactive to light.  No evidence of scleral icterus or conjunctival  pallor.  ENMT: Moist mucous membranes noted.  Posterior pharynx clear of any exudate or lesions.   Respiratory: clear to auscultation bilaterally, no wheezing, no crackles. Normal respiratory effort. No accessory muscle use.  Cardiovascular: Regular rate and rhythm, no murmurs / rubs / gallops. No extremity edema. 2+ pedal pulses. No carotid bruits.  Abdomen: Abdomen is soft and nontender.  No evidence of intra-abdominal masses.  Positive bowel sounds noted in all quadrants.   Musculoskeletal: Left lower extremity dressing in place.  No joint deformity upper and lower extremities. Good ROM, no contractures. Normal muscle tone.    Data Reviewed:  I have personally reviewed and interpreted labs, imaging.  Significant findings are   CBC: Recent Labs  Lab 06/09/22 0334 06/10/22 0348 06/11/22 0319 06/12/22 0317 06/13/22 0422  WBC 6.3 5.5 5.9 5.6 5.6  HGB 7.9* 9.2* 8.9* 8.1* 8.3*  HCT 24.7* 29.0* 27.5* 25.3* 26.9*  MCV 90.5 90.9 91.4 92.3 92.4  PLT 282 334 316 325 326   Basic Metabolic Panel: Recent Labs  Lab 06/09/22 0334 06/10/22 0348 06/11/22 0319 06/12/22 0317 06/13/22 0422  NA 138 140 139 139 135  K 3.4* 3.5 3.6 3.2* 3.6  CL 106 104 101 102 101  CO2 25 28 27 27 25   GLUCOSE 114* 98 98 100* 108*  BUN 15 13 17 16 21   CREATININE 0.66 0.56 0.57 0.71 0.65  CALCIUM 8.2* 9.0 9.1 8.7* 9.0  MG  --   --  2.0  --  2.1   GFR: Estimated Creatinine Clearance: 65.2 mL/min (by C-G formula based on SCr of 0.65 mg/dL). Liver Function Tests: No results for input(s): "AST", "ALT", "ALKPHOS", "BILITOT", "PROT", "ALBUMIN" in the last 168 hours.  Coagulation Profile: Recent Labs  Lab 06/11/22 0319  INR 0.9      Code Status:  Full code.     Time spent:  36 minutes  Author:  Marinda Elk MD  06/13/2022 9:00 PM

## 2022-06-13 NOTE — Progress Notes (Signed)
   Subjective: 6 Days Post-Op Procedure(s) (LRB): IRRIGATION AND DEBRIDEMENT KNEE WITH POLY EXCHANGE (Left)  Pt lying in bed in no acute distress Minimal pain/soreness in left the knee/leg Denies any new symptoms or issues Patient reports pain as mild.  Objective:   VITALS:   Vitals:   06/12/22 2316 06/13/22 0505  BP: (!) 146/62 (!) 153/67  Pulse: 69 (!) 59  Resp: 18 18  Temp: 98.9 F (37.2 C) 98.1 F (36.7 C)  SpO2: 100% 100%    Left knee dressing and wound vac in place No drainage or erythtmea Nv intact distally Calf soft and supple  LABS Recent Labs    06/11/22 0319 06/12/22 0317 06/13/22 0422  HGB 8.9* 8.1* 8.3*  HCT 27.5* 25.3* 26.9*  WBC 5.9 5.6 5.6  PLT 316 325 326    Recent Labs    06/11/22 0319 06/12/22 0317 06/13/22 0422  NA 139 139 135  K 3.6 3.2* 3.6  BUN 17 16 21   CREATININE 0.57 0.71 0.65  GLUCOSE 98 100* 108*     Assessment/Plan: 6 Days Post-Op Procedure(s) (LRB): IRRIGATION AND DEBRIDEMENT KNEE WITH POLY EXCHANGE (Left) Acute blood loss anemia - will continue to monitor her symptoms and H/H Pain management as needed Patient has PICC line  Plan for I&D with Dr. Ulice Bold on Monday 5/6    Brad Antonieta Iba, MPAS [facility] is now [facility]  [place] [address], [place], [place] [address] Phone: [phone] [email or web address] Facebook  [employer]

## 2022-06-14 DIAGNOSIS — E876 Hypokalemia: Secondary | ICD-10-CM | POA: Diagnosis not present

## 2022-06-14 DIAGNOSIS — I10 Essential (primary) hypertension: Secondary | ICD-10-CM | POA: Diagnosis not present

## 2022-06-14 DIAGNOSIS — T8131XA Disruption of external operation (surgical) wound, not elsewhere classified, initial encounter: Secondary | ICD-10-CM | POA: Diagnosis not present

## 2022-06-14 DIAGNOSIS — D62 Acute posthemorrhagic anemia: Secondary | ICD-10-CM | POA: Diagnosis not present

## 2022-06-14 LAB — BASIC METABOLIC PANEL
Anion gap: 8 (ref 5–15)
BUN: 21 mg/dL (ref 8–23)
CO2: 26 mmol/L (ref 22–32)
Calcium: 9.4 mg/dL (ref 8.9–10.3)
Chloride: 105 mmol/L (ref 98–111)
Creatinine, Ser: 0.68 mg/dL (ref 0.44–1.00)
GFR, Estimated: >60 mL/min (ref >60)
Glucose, Bld: 112 mg/dL — ABNORMAL HIGH (ref 70–99)
Potassium: 4 mmol/L (ref 3.5–5.1)
Sodium: 139 mmol/L (ref 135–145)

## 2022-06-14 NOTE — Plan of Care (Signed)
  Problem: Coping: Goal: Level of anxiety will decrease Outcome: Progressing   Problem: Pain Managment: Goal: General experience of comfort will improve Outcome: Progressing   Problem: Safety: Goal: Ability to remain free from injury will improve Outcome: Progressing   

## 2022-06-14 NOTE — Progress Notes (Signed)
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 [name] for multistage operative repair of left distal femur wound.  She fell and broke her distal femur last year, was taken to the OR on 04/10/2021 by Dr. Carola Frost for ORIF, left distal femur with intercondylar extension.  She then underwent hardware removal and distal femur replacement on 04/30/2022.  She has been readmitted by orthopedic surgery on 4/25 due to wound dehiscence of surgical site.  She was taken for debridement and washout with placement of wound VAC on 4/25.   Dr. [name] plans larger reconstruction surgery with the assistance of plastic surgery during this hospital stay group has been consulted for assistance with electrolyte management, management of hypertension and ensuring patient readiness for surgery.   Assessment and Plan: * Surgical wound dehiscence, initial encounter Continued management per primary team and infectious disease Wound VAC in place Intraoperative cultures on 4/25 and 4/28 both growing out Enterococcus faecalis Continuing intravenous antibiotics.  Patient has been transition to intravenous ampicillin based on recent sensitivities with anticipation of total of 6 weeks of antibiotic therapy ending on 07/19/2022 PICC line placed 5/3 Dr. Ulice Bold with plastic surgery who is planning to take patient to the operating room on 06/15/2022  Hypertension Improved antihypertensive control with most blood pressures at target. Concern for possible secondary cause of hypertension such as primary aldosteronism considering concurrent hypokalemia Following aldosterone and renin activity with ratio still  pending Continuing hydralazine to 25 mg 3 times daily.  Continuing amlodipine 10 mg daily Continuing losartan to 50 mg mg daily.  As needed intravenous hydralazine for markedly elevated blood pressure  Hypokalemia Improved On gradual low-dose potassium chloride although ideally it would be best if we could avoid this Continue to monitor potassium and magnesium levels closely and replacing as necessary Possible association with primary aldosteronism as mentioned above.  If blood testing confirms the diagnosis will add spironolactone  Acute postoperative anemia due to expected blood loss Likely multifactorial  Hemoglobin has stabilized. Iron panel revealing low iron saturation with borderline iron level.   Dose of intravenous Ferrlecit given on 5/2 and 5/4. Continue to monitor hemoglobin and hematocrit, transfuse if hemoglobin less than 7 or evidence of active bleeding  Chronic hepatitis C with cirrhosis (HCC) Known history of hepatitis C cirrhosis per review of prior records No sequela of advanced liver disease INR  consistent with good synthetic function of the liver  Malignant neoplasm of upper-outer quadrant of left breast in female, estrogen receptor positive (HCC) Continue Arimidex   Subjective:  Reports left thigh pain is improving.  Pain is mild in intensity, nonradiating, sore in quality, worse with movement.  Wound vac has been removed.  Physical Exam:  Vitals:   06/13/22 2141 06/14/22 0542 06/14/22 1418 06/14/22 1957  BP: 132/63 (!) 156/72 138/75 (!) 151/62  Pulse: 65 63 69 62  Resp: 17 18 18 18   Temp: 98.3 F (36.8 C) 98.6 F (37 C) 99 F (37.2 C) 98.5 F (36.9 C)  TempSrc: Oral Oral Oral Oral  SpO2: 100% 99% 100% 100%  Weight:      Height:         Constitutional: Awake alert and oriented x3, no associated distress.  Skin: Left thigh dressing unchanged  Eyes: Pupils are equally reactive to light.  No evidence of scleral icterus or conjunctival pallor.   ENMT: Moist mucous membranes noted.  Posterior pharynx clear of any exudate or lesions.   Respiratory: clear to auscultation bilaterally, no wheezing, no crackles. Normal respiratory effort. No accessory muscle use.  Cardiovascular: Regular rate and rhythm, no murmurs / rubs / gallops. No extremity edema. 2+ pedal pulses. No carotid bruits.  Abdomen: Abdomen is soft and nontender.  No evidence of intra-abdominal masses.  Positive bowel sounds noted in all quadrants.   Musculoskeletal: Left lower extremity dressing in place.  No joint deformity upper and lower extremities. Good ROM, no contractures. Normal muscle tone.    Data Reviewed:  I have personally reviewed and interpreted labs, imaging.  Significant findings are   CBC: Recent Labs  Lab 06/09/22 0334 06/10/22 0348 06/11/22 0319 06/12/22 0317 06/13/22 0422  WBC 6.3 5.5 5.9 5.6 5.6  HGB 7.9* 9.2* 8.9* 8.1* 8.3*  HCT 24.7* 29.0* 27.5* 25.3* 26.9*  MCV 90.5 90.9 91.4 92.3 92.4  PLT 282 334 316 325 326   Basic Metabolic Panel: Recent Labs  Lab 06/10/22 0348 06/11/22 0319 06/12/22 0317 06/13/22 0422 06/14/22 1130  NA 140 139 139 135 139  K 3.5 3.6 3.2* 3.6 4.0  CL 104 101 102 101 105  CO2 28 27 27 25 26   GLUCOSE 98 98 100* 108* 112*  BUN 13 17 16 21 21   CREATININE 0.56 0.57 0.71 0.65 0.68  CALCIUM 9.0 9.1 8.7* 9.0 9.4  MG  --  2.0  --  2.1  --    GFR: Estimated Creatinine Clearance: 65.2 mL/min (by C-G formula based on SCr of 0.68 mg/dL). Liver Function Tests: No results for input(s): "AST", "ALT", "ALKPHOS", "BILITOT", "PROT", "ALBUMIN" in the last 168 hours.  Coagulation Profile: Recent Labs  Lab 06/11/22 0319  INR 0.9      Code Status:  Full code.     Time spent:  337 minutes  Author:  Marinda Elk MD  06/14/2022 11:37 PM

## 2022-06-14 NOTE — Progress Notes (Signed)
Physical Therapy Treatment Patient Details Name: Tammy [name] MRN: [id number] DOB: 05-20-1955 Today's Date: 06/14/2022   History of Present Illness 67 y.o. 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 She is s/p left distal femur ORIF fracture 04/10/21 with Dr. 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 s/p removal of implant and replacement of L distal femur 04/30/22. Now admitted 06/04/22 with necrotic wound s/p surgical debridement. And s/p repeat debridement with exchange of rotating hinge bearing 06/07/22.  PMH: bipolar, CVA.    PT Comments    Pt reports performing her exercises prior to therapist arrival. Pt reports she has been keeping her KI in place and snug fit.  Pt ambulated in hallway and able to progress distance a little today despite pain (wanted to try without premedication) so ice applied end of session and RN notified of pt request for pain meds.  Pt anticipating surgery tomorrow.    Recommendations for follow up therapy are one component of a multi-disciplinary discharge planning process, led by the attending physician.  Recommendations may be updated based on patient status, additional functional criteria and insurance authorization.  Follow Up Recommendations       Assistance Recommended at Discharge Intermittent Supervision/Assistance  Patient can return home with the following A little help with bathing/dressing/bathroom;Assist for transportation;Help with stairs or ramp for entrance;Assistance with cooking/housework;A little help with walking and/or transfers   Equipment Recommendations  None recommended by PT    Recommendations for Other Services       Precautions / Restrictions Precautions Precautions:  Fall Precaution Comments: wound vac L knee; no L knee flexion Required Braces or Orthoses: Knee Immobilizer - Left Knee Immobilizer - Left: On at all times Restrictions Weight Bearing Restrictions: No LLE Weight Bearing: Weight bearing as tolerated     Mobility  Bed Mobility Overal bed mobility: Modified Independent Bed Mobility: Supine to Sit           General bed mobility comments: pt used gait belt as LLE lifter    Transfers Overall transfer level: Needs assistance Equipment used: Rolling walker (2 wheels) Transfers: Sit to/from Stand Sit to Stand: Supervision           General transfer comment: good hand placement    Ambulation/Gait Ambulation/Gait assistance: Supervision Gait Distance (Feet): 97 Feet Assistive device: Rolling walker (2 wheels) Gait Pattern/deviations: Step-to pattern, Decreased stance time - left, Antalgic Gait velocity: decr     General Gait Details: KI on L knee, steady, no loss of balance. Pt wanted to progress distance a little more then previous day however also with pain   Stairs             Wheelchair Mobility    Modified Rankin (Stroke Patients Only)       Balance                                            Cognition Arousal/Alertness: Awake/alert Behavior During Therapy: WFL for tasks assessed/performed Overall Cognitive Status: Within Functional Limits for tasks assessed  General Comments: very pleasant        Exercises      General Comments        Pertinent Vitals/Pain Pain Assessment Pain Assessment: Faces Faces Pain Scale: Hurts even more Pain Location: L knee with walking Pain Descriptors / Indicators: Sore, Aching Pain Intervention(s): Patient requesting pain meds-RN notified, Ice applied (reports wanting to try therapy without pain meds first, requested pain meds once ambulating but felt able to continue to end of session)     Home Living                          Prior Function            PT Goals (current goals can now be found in the care plan section) Progress towards PT goals: Progressing toward goals    Frequency    Min 1X/week      PT Plan Current plan remains appropriate    Co-evaluation              AM-PAC PT "6 Clicks" Mobility   Outcome Measure  Help needed turning from your back to your side while in a flat bed without using bedrails?: None Help needed moving from lying on your back to sitting on the side of a flat bed without using bedrails?: None Help needed moving to and from a bed to a chair (including a wheelchair)?: None Help needed standing up from a chair using your arms (e.g., wheelchair or bedside chair)?: None Help needed to walk in hospital room?: A Little Help needed climbing 3-5 steps with a railing? : A Little 6 Click Score: 22    End of Session   Activity Tolerance: Patient tolerated treatment well Patient left: in chair;with call bell/phone within reach;with family/visitor present   PT Visit Diagnosis: Difficulty in walking, not elsewhere classified (R26.2);Pain Pain - Right/Left: Left Pain - part of body: Knee     Time: 1350-1403 PT Time Calculation (min) (ACUTE ONLY): 13 min  Charges:  $Gait Training: 8-22 mins                     Paulino Door, DPT Physical Therapist Acute Rehabilitation Services Office: [phone]    Janan Halter Payson 06/14/2022, 4:29 PM

## 2022-06-15 ENCOUNTER — Encounter (HOSPITAL_COMMUNITY)
Admission: AD | Disposition: A | Discharge status: Home Health | Payer: Self-pay | Source: Home / Self Care | Attending: Orthopedic Surgery

## 2022-06-15 ENCOUNTER — Inpatient Hospital Stay (HOSPITAL_COMMUNITY): Payer: Medicare Other

## 2022-06-15 ENCOUNTER — Encounter (HOSPITAL_COMMUNITY): Payer: Self-pay | Admitting: Orthopedic Surgery

## 2022-06-15 ENCOUNTER — Ambulatory Visit: Payer: Medicare Other | Admitting: Physical Therapy

## 2022-06-15 ENCOUNTER — Other Ambulatory Visit: Payer: Self-pay

## 2022-06-15 DIAGNOSIS — E876 Hypokalemia: Secondary | ICD-10-CM | POA: Diagnosis not present

## 2022-06-15 DIAGNOSIS — S81002A Unspecified open wound, left knee, initial encounter: Secondary | ICD-10-CM

## 2022-06-15 DIAGNOSIS — F418 Other specified anxiety disorders: Secondary | ICD-10-CM

## 2022-06-15 DIAGNOSIS — D62 Acute posthemorrhagic anemia: Secondary | ICD-10-CM | POA: Diagnosis not present

## 2022-06-15 DIAGNOSIS — Z87891 Personal history of nicotine dependence: Secondary | ICD-10-CM

## 2022-06-15 DIAGNOSIS — I10 Essential (primary) hypertension: Secondary | ICD-10-CM | POA: Diagnosis not present

## 2022-06-15 DIAGNOSIS — T8131XA Disruption of external operation (surgical) wound, not elsewhere classified, initial encounter: Secondary | ICD-10-CM | POA: Diagnosis not present

## 2022-06-15 HISTORY — PX: APPLICATION OF WOUND VAC: SHX5189

## 2022-06-15 HISTORY — PX: INCISION AND DRAINAGE OF WOUND: SHX1803

## 2022-06-15 LAB — RENAL FUNCTION PANEL
Albumin: 3.1 g/dL — ABNORMAL LOW (ref 3.5–5.0)
Anion gap: 8 (ref 5–15)
BUN: 22 mg/dL (ref 8–23)
CO2: 25 mmol/L (ref 22–32)
Calcium: 8.7 mg/dL — ABNORMAL LOW (ref 8.9–10.3)
Chloride: 104 mmol/L (ref 98–111)
Creatinine, Ser: 0.74 mg/dL (ref 0.44–1.00)
GFR, Estimated: >60 mL/min (ref >60)
Glucose, Bld: 118 mg/dL — ABNORMAL HIGH (ref 70–99)
Phosphorus: 3.9 mg/dL (ref 2.5–4.6)
Potassium: 3.5 mmol/L (ref 3.5–5.1)
Sodium: 137 mmol/L (ref 135–145)

## 2022-06-15 LAB — MAGNESIUM: Magnesium: 2.4 mg/dL (ref 1.7–2.4)

## 2022-06-15 SURGERY — IRRIGATION AND DEBRIDEMENT WOUND
Anesthesia: General | Site: Knee | Laterality: Left

## 2022-06-15 MED ORDER — ONDANSETRON HCL 4 MG/2ML IJ SOLN
INTRAMUSCULAR | Status: AC
  Filled 2022-06-15: qty 2

## 2022-06-15 MED ORDER — PROPOFOL 10 MG/ML IV BOLUS
INTRAVENOUS | Status: DC | PRN
  Administered 2022-06-15: 160 mg via INTRAVENOUS

## 2022-06-15 MED ORDER — BUPIVACAINE HCL (PF) 0.25 % IJ SOLN
INTRAMUSCULAR | Status: AC
  Filled 2022-06-15: qty 30

## 2022-06-15 MED ORDER — ONDANSETRON HCL 4 MG/2ML IJ SOLN
INTRAMUSCULAR | Status: DC | PRN
  Administered 2022-06-15: 4 mg via INTRAVENOUS

## 2022-06-15 MED ORDER — MIDAZOLAM HCL 5 MG/5ML IJ SOLN
INTRAMUSCULAR | Status: DC | PRN
  Administered 2022-06-15: 2 mg via INTRAVENOUS

## 2022-06-15 MED ORDER — FENTANYL CITRATE PF 50 MCG/ML IJ SOSY
25.0000 ug | PREFILLED_SYRINGE | INTRAMUSCULAR | Status: DC | PRN
  Administered 2022-06-15: 50 ug via INTRAVENOUS

## 2022-06-15 MED ORDER — FENTANYL CITRATE (PF) 100 MCG/2ML IJ SOLN
INTRAMUSCULAR | Status: DC | PRN
  Administered 2022-06-15 (×2): 50 ug via INTRAVENOUS

## 2022-06-15 MED ORDER — PROPOFOL 10 MG/ML IV BOLUS
INTRAVENOUS | Status: AC
  Filled 2022-06-15: qty 20

## 2022-06-15 MED ORDER — LIDOCAINE 2% (20 MG/ML) 5 ML SYRINGE
INTRAMUSCULAR | Status: DC | PRN
  Administered 2022-06-15: 100 mg via INTRAVENOUS

## 2022-06-15 MED ORDER — MIDAZOLAM HCL 2 MG/2ML IJ SOLN
INTRAMUSCULAR | Status: AC
  Filled 2022-06-15: qty 2

## 2022-06-15 MED ORDER — PHENYLEPHRINE HCL-NACL 20-0.9 MG/250ML-% IV SOLN
INTRAVENOUS | Status: DC | PRN
  Administered 2022-06-15: 40 ug/min via INTRAVENOUS

## 2022-06-15 MED ORDER — DEXAMETHASONE SODIUM PHOSPHATE 10 MG/ML IJ SOLN
INTRAMUSCULAR | Status: DC | PRN
  Administered 2022-06-15: 8 mg via INTRAVENOUS

## 2022-06-15 MED ORDER — FENTANYL CITRATE PF 50 MCG/ML IJ SOSY
PREFILLED_SYRINGE | INTRAMUSCULAR | Status: AC
  Filled 2022-06-15: qty 2

## 2022-06-15 MED ORDER — 0.9 % SODIUM CHLORIDE (POUR BTL) OPTIME
TOPICAL | Status: DC | PRN
  Administered 2022-06-15: 1000 mL

## 2022-06-15 MED ORDER — LACTATED RINGERS IV SOLN: INTRAVENOUS | Status: DC

## 2022-06-15 MED ORDER — DEXAMETHASONE SODIUM PHOSPHATE 10 MG/ML IJ SOLN
INTRAMUSCULAR | Status: AC
  Filled 2022-06-15: qty 1

## 2022-06-15 MED ORDER — ACETAMINOPHEN 500 MG PO TABS: 1000.0000 mg | ORAL_TABLET | Freq: Once | ORAL | Status: DC

## 2022-06-15 MED ORDER — FENTANYL CITRATE (PF) 100 MCG/2ML IJ SOLN
INTRAMUSCULAR | Status: AC
  Filled 2022-06-15: qty 2

## 2022-06-15 MED ORDER — CHLORHEXIDINE GLUCONATE 0.12 % MT SOLN: 15.0000 mL | Freq: Once | OROMUCOSAL | Status: DC

## 2022-06-15 SURGICAL SUPPLY — 48 items
BAG COUNTER SPONGE SURGICOUNT (BAG) IMPLANT
BAG SPNG CNTER NS LX DISP (BAG)
BNDG CMPR 5X4 KNIT ELC UNQ LF (GAUZE/BANDAGES/DRESSINGS) ×1
BNDG CMPR MED 10X6 ELC LF (GAUZE/BANDAGES/DRESSINGS) ×1
BNDG ELASTIC 4INX 5YD STR LF (GAUZE/BANDAGES/DRESSINGS) ×2 IMPLANT
BNDG ELASTIC 6X10 VLCR STRL LF (GAUZE/BANDAGES/DRESSINGS) IMPLANT
BNDG GAUZE DERMACEA FLUFF 4 (GAUZE/BANDAGES/DRESSINGS) IMPLANT
BNDG GZE DERMACEA 4 6PLY (GAUZE/BANDAGES/DRESSINGS) ×1
CNTNR URN SCR LID CUP LEK RST (MISCELLANEOUS) ×2 IMPLANT
CONT SPEC 4OZ STRL OR WHT (MISCELLANEOUS) ×1
COVER SURGICAL LIGHT HANDLE (MISCELLANEOUS) ×2 IMPLANT
DRAPE DERMATAC (DRAPES) IMPLANT
DRAPE INCISE IOBAN 66X45 STRL (DRAPES) ×2 IMPLANT
DRAPE LAPAROTOMY T 102X78X121 (DRAPES) ×2 IMPLANT
DRAPE UTILITY XL STRL (DRAPES) ×2 IMPLANT
DRESSING MORCELLS FINE 1000 (Tissue) IMPLANT
DRSG ADAPTIC 3X8 NADH LF (GAUZE/BANDAGES/DRESSINGS) ×2 IMPLANT
DRSG CUTIMED SORBACT 7X9 (GAUZE/BANDAGES/DRESSINGS) IMPLANT
DRSG HYDROCOLLOID 4X4 (GAUZE/BANDAGES/DRESSINGS) IMPLANT
DRSG VERSA FOAM LRG 10X15 (GAUZE/BANDAGES/DRESSINGS) IMPLANT
ELECT REM PT RETURN 15FT ADLT (MISCELLANEOUS) ×2 IMPLANT
GAUZE PAD ABD 8X10 STRL (GAUZE/BANDAGES/DRESSINGS) IMPLANT
GAUZE SPONGE 4X4 12PLY STRL (GAUZE/BANDAGES/DRESSINGS) ×2 IMPLANT
GLOVE BIO SURGEON STRL SZ 6.5 (GLOVE) ×2 IMPLANT
GOWN STRL REUS W/ TWL LRG LVL3 (GOWN DISPOSABLE) ×4 IMPLANT
GOWN STRL REUS W/TWL LRG LVL3 (GOWN DISPOSABLE) ×2
GRAFT MYRIAD 3 LAYER 5X5 (Graft) IMPLANT
HANDPIECE INTERPULSE COAX TIP (DISPOSABLE)
KIT BASIN OR (CUSTOM PROCEDURE TRAY) ×2 IMPLANT
KIT TURNOVER KIT A (KITS) IMPLANT
NDL HYPO 22X1.5 SAFETY MO (MISCELLANEOUS) IMPLANT
NEEDLE HYPO 22X1.5 SAFETY MO (MISCELLANEOUS) IMPLANT
PACK GENERAL/GYN (CUSTOM PROCEDURE TRAY) ×2 IMPLANT
PAD CAST 4YDX4 CTTN HI CHSV (CAST SUPPLIES) ×2 IMPLANT
PADDING CAST COTTON 4X4 STRL (CAST SUPPLIES) ×1
PENCIL SMOKE EVACUATOR (MISCELLANEOUS) IMPLANT
SET HNDPC FAN SPRY TIP SCT (DISPOSABLE) IMPLANT
SOL SCRUB PVP POV-IOD 4OZ 7.5% (MISCELLANEOUS) ×1
SOLUTION SCRB POV-IOD 4OZ 7.5% (MISCELLANEOUS) ×2 IMPLANT
SPIKE FLUID TRANSFER (MISCELLANEOUS) IMPLANT
STAPLER VISISTAT 35W (STAPLE) ×2 IMPLANT
SUT MNCRL AB 4-0 PS2 18 (SUTURE) ×2 IMPLANT
SUT SILK 4 0 PS 2 (SUTURE) IMPLANT
SUT VIC AB 5-0 PS2 18 (SUTURE) IMPLANT
SWAB COLLECTION DEVICE MRSA (MISCELLANEOUS) IMPLANT
SWAB CULTURE ESWAB REG 1ML (MISCELLANEOUS) ×2 IMPLANT
SYR CONTROL 10ML LL (SYRINGE) IMPLANT
TOWEL OR 17X26 10 PK STRL BLUE (TOWEL DISPOSABLE) ×2 IMPLANT

## 2022-06-15 NOTE — Op Note (Signed)
DATE OF OPERATION: 06/15/2022  LOCATION: [facility] Inpatient  PREOPERATIVE DIAGNOSIS: Left knee wound  POSTOPERATIVE DIAGNOSIS: Same  PROCEDURE: preparation of left knee wound for placement of Myriad 5 x 10 cm sheet and 1 gm powder  SURGEON: Foster Simpson, DO  ASSISTANT: Burna Forts, PA  EBL: none  CONDITION: Stable  COMPLICATIONS: None  INDICATION: The patient, Tammy [name], is a 67 y.o. female born on [birth year], is here for treatment of a left knee wound.   PROCEDURE DETAILS:  The patient was seen prior to surgery and marked.  The IV antibiotics were given. The patient was taken to the operating room and given a general anesthetic. A standard time out was performed and all information was confirmed by those in the room. The left knee was prepped and draped. The 5 x 10 cm knee wound was irrigated with saline.  There was not sign of active purulence.  All of the myriad powder and sheet was placed and secured with the 5-0 Vicryl.  The sorbact was secured to the skin with the vicryl.  The white VAC sponge was applied over the sorbact and the black sponge over the incision site.  There was an excellent seal.  The patient was allowed to wake up and taken to recovery room in stable condition at the end of the case. The family was notified at the end of the case.   The advanced practice practitioner (APP) assisted throughout the case.  The APP was essential in retraction and counter traction when needed to make the case progress smoothly.  This retraction and assistance made it possible to see the tissue plans for the procedure.  The assistance was needed for blood control, tissue re-approximation and assisted with closure of the incision site.

## 2022-06-15 NOTE — Anesthesia Postprocedure Evaluation (Signed)
Anesthesia Post Note  Patient: Tammy [name]  Procedure(s) Performed: excision of knee wound with Myriad (Left: Knee) APPLICATION OF SKIN SUBSTITUTE (Left: Knee) APPLICATION OF WOUND VAC (Left: Knee)     Patient location during evaluation: PACU Anesthesia Type: General Level of consciousness: awake and alert Pain management: pain level controlled Vital Signs Assessment: post-procedure vital signs reviewed and stable Respiratory status: spontaneous breathing, nonlabored ventilation and respiratory function stable Cardiovascular status: blood pressure returned to baseline Postop Assessment: no apparent nausea or vomiting Anesthetic complications: no   No notable events documented.  Last Vitals:  Vitals:   06/15/22 1800 06/15/22 1815  BP: (!) 144/71 (!) 154/63  Pulse: (!) 58 62  Resp: 16 14  Temp:    SpO2: 98% 100%    Last Pain:  Vitals:   06/15/22 1815  TempSrc:   PainSc: 2                  Shanda Howells

## 2022-06-15 NOTE — Care Management Important Message (Signed)
Important Message  Patient Details IM Letter given. Name: Jamiemarie Chowdhury MRN: [id number] Date of Birth: Jun 11, 1955   Medicare Important Message Given:  Yes     Caren Macadam 06/15/2022, 2:23 PM

## 2022-06-15 NOTE — Interval H&P Note (Signed)
History and Physical Interval Note:  06/15/2022 4:11 PM  Tammy [name]  has presented today for surgery, with the diagnosis of Surgical wound dehiscence.  The various methods of treatment have been discussed with the patient and family. After consideration of risks, benefits and other options for treatment, the patient has consented to  Procedure(s): excision of knee wound with Myriad (Left) APPLICATION OF SKIN SUBSTITUTE (Left) APPLICATION OF WOUND VAC (Left) as a surgical intervention.  The patient's history has been reviewed, patient examined, no change in status, stable for surgery.  I have reviewed the patient's chart and labs.  Questions were answered to the patient's satisfaction.     [name] Tavis Kring

## 2022-06-15 NOTE — Progress Notes (Signed)
Nutrition Follow-up  DOCUMENTATION CODES:   Not applicable  INTERVENTION:  Continue Heart diet Continue juven BID   RD recommends continuing Vitamin C and zinc sulfate for wound healing   NUTRITION DIAGNOSIS:   Increased nutrient needs related to post-op healing as evidenced by estimated needs.   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 [name] for multistage operative repair of left distal femur wound.  She fell and broke her distal femur last year, was taken to the OR on 04/10/2021 by Dr. Carola Frost for ORIF, left distal femur with intercondylar extension.  She then underwent hardware removal and distal femur replacement on 04/30/2022.  She has not been readmitted by orthopedic surgery on 4/25 due to wound dehiscence of surgical site.  She was taken for debridement and washout with placement of wound VAC on 4/25.  Attempted  to visit patient who was OTF for procedure with plastic surgery (application of skin substitute and wound vac change)   POD 8 I&D knee with poly exchange   Labs:  Glu 118 Meds: ampicillin, vitamin C, calcium carbonate, vitamin B12, colace, MVI, juven BID, protonix, klor-con, senokot, zinc sulfate Wt: admit wt 168#; CBW 168# -stable wt  PO: 75-100% meal intake  I/O's:  +4.7 L    Diet Order:   Diet Order             Diet Heart Room service appropriate? Yes; Fluid consistency: Thin  Diet effective now                   EDUCATION NEEDS:   Education needs have been addressed  Skin:  Skin Assessment: Skin Integrity Issues: Skin Integrity Issues:: Incisions Incisions: 4/25 left knee, 4/28  left knee  Last BM:  5/5  Height:   Ht Readings from Last 1 Encounters:  06/15/22 5\' 2"  (1.575 m)    Weight:   Wt Readings from Last 1 Encounters:  06/15/22 76.2 kg    Ideal Body Weight:     BMI:  Body mass index is 30.73 kg/m.  Estimated Nutritional Needs:   Kcal:  1400-1600  Protein:  70-85g  Fluid:  1.6L/day    Leodis Rains, RDN, LDN  Clinical Nutrition

## 2022-06-15 NOTE — Anesthesia Preprocedure Evaluation (Addendum)
Anesthesia Evaluation  Patient identified by MRN, date of birth, ID band Patient awake    Reviewed: Allergy & Precautions, NPO status , Patient's Chart, lab work & pertinent test results  History of Anesthesia Complications Negative for: history of anesthetic complications  Airway Mallampati: II  TM Distance: >3 FB Neck ROM: Full    Dental no notable dental hx.    Pulmonary former smoker   Pulmonary exam normal        Cardiovascular hypertension, Pt. on medications Normal cardiovascular exam     Neuro/Psych   Anxiety Depression Bipolar Disorder   CVA (left weakness), Residual Symptoms    GI/Hepatic ,GERD  Medicated,,(+) Hepatitis -, C  Endo/Other  negative endocrine ROS    Renal/GU negative Renal ROS     Musculoskeletal  (+) Arthritis ,  Surgical wound dehiscence of left knee   Abdominal   Peds  Hematology  (+) Blood dyscrasia (Hgb 8.3), anemia   Anesthesia Other Findings Day of surgery medications reviewed with patient.  Reproductive/Obstetrics                             Anesthesia Physical Anesthesia Plan  ASA: 3  Anesthesia Plan: General   Post-op Pain Management: Tylenol PO (pre-op)*   Induction: Intravenous  PONV Risk Score and Plan: 3 and Ondansetron, Dexamethasone, Treatment may vary due to age or medical condition and Midazolam  Airway Management Planned: LMA  Additional Equipment: None  Intra-op Plan:   Post-operative Plan: Extubation in OR  Informed Consent: I have reviewed the patients History and Physical, chart, labs and discussed the procedure including the risks, benefits and alternatives for the proposed anesthesia with the patient or authorized representative who has indicated his/her understanding and acceptance.     Dental advisory given  Plan Discussed with: CRNA  Anesthesia Plan Comments:        Anesthesia Quick Evaluation

## 2022-06-15 NOTE — Plan of Care (Signed)
  Problem: Coping: Goal: Level of anxiety will decrease Outcome: Progressing   Problem: Pain Managment: Goal: General experience of comfort will improve Outcome: Progressing   

## 2022-06-15 NOTE — Anesthesia Procedure Notes (Signed)
Procedure Name: LMA Insertion Date/Time: 06/15/2022 5:06 PM  Performed by: Epimenio Sarin, CRNAPre-anesthesia Checklist: Patient identified, Emergency Drugs available, Suction available, Patient being monitored and Timeout performed Patient Re-evaluated:Patient Re-evaluated prior to induction Oxygen Delivery Method: Circle system utilized Preoxygenation: Pre-oxygenation with 100% oxygen Induction Type: IV induction Ventilation: Mask ventilation without difficulty LMA: LMA with gastric port inserted LMA Size: 4.0 Number of attempts: 1 Dental Injury: Teeth and Oropharynx as per pre-operative assessment

## 2022-06-15 NOTE — Transfer of Care (Signed)
Immediate Anesthesia Transfer of Care Note  Patient: Tammy [name]  Procedure(s) Performed: excision of knee wound with Myriad (Left: Knee) APPLICATION OF SKIN SUBSTITUTE (Left: Knee) APPLICATION OF WOUND VAC (Left: Knee)  Patient Location: PACU  Anesthesia Type:General  Level of Consciousness: drowsy and patient cooperative  Airway & Oxygen Therapy: Patient Spontanous Breathing and Patient connected to face mask oxygen  Post-op Assessment: Report given to RN and Post -op Vital signs reviewed and stable  Post vital signs: Reviewed and stable  Last Vitals:  Vitals Value Taken Time  BP 149/79 06/15/22 1738  Temp    Pulse 65 06/15/22 1739  Resp 14 06/15/22 1739  SpO2 100 % 06/15/22 1739  Vitals shown include unvalidated device data.  Last Pain:  Vitals:   06/15/22 1557  TempSrc:   PainSc: 0-No pain      Patients Stated Pain Goal: 2 (06/13/22 0240)  Complications: No notable events documented.

## 2022-06-15 NOTE — Progress Notes (Signed)
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 [name] for multistage operative repair of left distal femur wound.  She fell and broke her distal femur last year, was taken to the OR on 04/10/2021 by Dr. Carola Frost for ORIF, left distal femur with intercondylar extension.  She then underwent hardware removal and distal femur replacement on 04/30/2022.  She has been readmitted by orthopedic surgery on 4/25 due to wound dehiscence of surgical site.  She was taken for debridement and washout with placement of wound VAC on 4/25.   The hospitalist group was consulted for assistance with electrolyte management, management of hypertension and ensuring patient readiness for surgery.  In the days that followed blood pressures became much better controlled with titration of multiple antihypertensives.  There was concern for possible primary aldosteronism.  Laboratory workup was obtained.  Hypokalemia was repeatedly corrected with oral potassium chloride supplementation.  Dr. [name] consulted Dr. Ulice Bold with plastic surgery and patient underwent reconstructive plastic surgery of the wound on 06/15/2022.   Assessment and Plan: * Surgical wound dehiscence, initial encounter Continued management per primary team and infectious disease Patient underwent plastic surgery of left knee/thigh wound with Dr. Ulice Bold afternoon of 5/6 Intraoperative cultures on 4/25 and 4/28 both growing out Enterococcus faecalis Continuing intravenous antibiotics.  Patient has been transition to intravenous ampicillin based on recent sensitivities with anticipation of total of 6 weeks of antibiotic therapy  ending on 07/19/2022 PICC line placed 5/3   Hypertension Improved antihypertensive control with most blood pressures at target on current antihypertensive regimen. Concern for possible secondary cause of hypertension such as primary aldosteronism considering concurrent hypokalemia.  Following aldosterone and renin activity with ratio still pending.  This may need to be followed up on as an outpatient depending on when patient is discharged. Continuing hydralazine to 25 mg 3 times daily.  Continuing amlodipine 10 mg daily Continuing losartan to 50 mg mg daily.  As needed intravenous hydralazine for markedly elevated blood pressure  Hypokalemia Improved On gradual low-dose potassium chloride although ideally it would be best if we could avoid this Continue to monitor potassium and magnesium levels closely and replacing as necessary Possible association with primary aldosteronism as mentioned above.  If blood testing confirms the diagnosis will add spironolactone  Acute postoperative anemia due to expected blood loss Likely multifactorial  Hemoglobin has stabilized. Iron panel revealing low iron saturation with borderline iron level.   Received intravenous Ferrlecit given on 5/2 and 5/4. Continue to monitor hemoglobin and hematocrit, transfuse if hemoglobin less than 7 or evidence of active bleeding  Chronic hepatitis C with cirrhosis (HCC) Known history of hepatitis C cirrhosis per review of prior records No sequela of advanced liver disease INR  consistent with good synthetic function of the liver  Malignant neoplasm of upper-outer quadrant of left breast in female, estrogen receptor positive (HCC) Continue Arimidex   Subjective:  Patient reports very little pain since the surgery.  Patient complains of feeling generally tired.  Physical Exam:  Vitals:   06/15/22 1815 06/15/22 1829 06/15/22 1839 06/15/22 2046  BP: (!) 154/63 (!) 141/64 (!) 146/82 132/67  Pulse: 62 62 68 71   Resp: 14 16 16 18   Temp:  98.2 F (36.8 C) 98.1 F (36.7 C) 98.1 F (36.7 C)  TempSrc:    Oral  SpO2: 100% 92% 100% 96%  Weight:      Height:         Constitutional: Lethargic but arousable, oriented x 3.  No associated distress.   Skin: Surgical wound of the left thigh/leg covered by immobilizer. Eyes: Pupils are equally reactive to light.  No evidence of scleral icterus or conjunctival pallor.  ENMT: Moist mucous membranes noted.  Posterior pharynx clear of any exudate or lesions.   Respiratory: clear to auscultation bilaterally, no wheezing, no crackles. Normal respiratory effort. No accessory muscle use.  Cardiovascular: Regular rate and rhythm, no murmurs / rubs / gallops. No extremity edema. 2+ pedal pulses. No carotid bruits.  Abdomen: Abdomen is soft and nontender.  No evidence of intra-abdominal masses.  Positive bowel sounds noted in all quadrants.   Musculoskeletal: Left lower extremity immobilizer.   Data Reviewed:  I have personally reviewed and interpreted labs, imaging.  Significant findings are   CBC: Recent Labs  Lab 06/09/22 0334 06/10/22 0348 06/11/22 0319 06/12/22 0317 06/13/22 0422  WBC 6.3 5.5 5.9 5.6 5.6  HGB 7.9* 9.2* 8.9* 8.1* 8.3*  HCT 24.7* 29.0* 27.5* 25.3* 26.9*  MCV 90.5 90.9 91.4 92.3 92.4  PLT 282 334 316 325 326   Basic Metabolic Panel: Recent Labs  Lab 06/11/22 0319 06/12/22 0317 06/13/22 0422 06/14/22 1130 06/15/22 0154  NA 139 139 135 139 137  K 3.6 3.2* 3.6 4.0 3.5  CL 101 102 101 105 104  CO2 27 27 25 26 25   GLUCOSE 98 100* 108* 112* 118*  BUN 17 16 21 21 22   CREATININE 0.57 0.71 0.65 0.68 0.74  CALCIUM 9.1 8.7* 9.0 9.4 8.7*  MG 2.0  --  2.1  --  2.4  PHOS  --   --   --   --  3.9   GFR: Estimated Creatinine Clearance: 65.2 mL/min (by C-G formula based on SCr of 0.74 mg/dL). Liver Function Tests: Recent Labs  Lab 06/15/22 0154  ALBUMIN 3.1*    Coagulation Profile: Recent Labs  Lab 06/11/22 0319  INR 0.9       Code Status:  Full code.     Time spent:  35 minutes  Author:  Marinda Elk MD  06/15/2022 11:38 PM

## 2022-06-16 ENCOUNTER — Encounter (HOSPITAL_COMMUNITY): Payer: Self-pay | Admitting: Plastic Surgery

## 2022-06-16 LAB — COMPREHENSIVE METABOLIC PANEL
ALT: 14 U/L (ref 0–44)
AST: 16 U/L (ref 15–41)
Albumin: 3 g/dL — ABNORMAL LOW (ref 3.5–5.0)
Alkaline Phosphatase: 74 U/L (ref 38–126)
Anion gap: 10 (ref 5–15)
BUN: 19 mg/dL (ref 8–23)
CO2: 24 mmol/L (ref 22–32)
Calcium: 8.9 mg/dL (ref 8.9–10.3)
Chloride: 102 mmol/L (ref 98–111)
Creatinine, Ser: 0.72 mg/dL (ref 0.44–1.00)
GFR, Estimated: >60 mL/min (ref >60)
Glucose, Bld: 136 mg/dL — ABNORMAL HIGH (ref 70–99)
Potassium: 3.9 mmol/L (ref 3.5–5.1)
Sodium: 136 mmol/L (ref 135–145)
Total Bilirubin: 0.2 mg/dL — ABNORMAL LOW (ref 0.3–1.2)
Total Protein: 7 g/dL (ref 6.5–8.1)

## 2022-06-16 LAB — ALDOSTERONE + RENIN ACTIVITY W/ RATIO
ALDO / PRA Ratio: 5.6 (ref 0.0–30.0)
Aldosterone: 1.2 ng/dL (ref 0.0–30.0)
PRA LC/MS/MS: 0.213 ng/mL/hr (ref 0.167–5.380)

## 2022-06-16 LAB — CBC WITH DIFFERENTIAL/PLATELET
Abs Immature Granulocytes: 0.05 10*3/uL (ref 0.00–0.07)
Basophils Absolute: 0 10*3/uL (ref 0.0–0.1)
Basophils Relative: 0 %
Eosinophils Absolute: 0 10*3/uL (ref 0.0–0.5)
Eosinophils Relative: 0 %
HCT: 27.9 % — ABNORMAL LOW (ref 36.0–46.0)
Hemoglobin: 8.7 g/dL — ABNORMAL LOW (ref 12.0–15.0)
Immature Granulocytes: 1 %
Lymphocytes Relative: 7 %
Lymphs Abs: 0.5 10*3/uL — ABNORMAL LOW (ref 0.7–4.0)
MCH: 29.5 pg (ref 26.0–34.0)
MCHC: 31.2 g/dL (ref 30.0–36.0)
MCV: 94.6 fL (ref 80.0–100.0)
Monocytes Absolute: 0.2 10*3/uL (ref 0.1–1.0)
Monocytes Relative: 3 %
Neutro Abs: 6.5 10*3/uL (ref 1.7–7.7)
Neutrophils Relative %: 89 %
Platelets: 331 10*3/uL (ref 150–400)
RBC: 2.95 MIL/uL — ABNORMAL LOW (ref 3.87–5.11)
RDW: 16.8 % — ABNORMAL HIGH (ref 11.5–15.5)
WBC: 7.2 10*3/uL (ref 4.0–10.5)
nRBC: 0 % (ref 0.0–0.2)

## 2022-06-16 LAB — MAGNESIUM: Magnesium: 2 mg/dL (ref 1.7–2.4)

## 2022-06-16 MED ORDER — ORAL CARE MOUTH RINSE: 15.0000 mL | OROMUCOSAL | Status: DC | PRN

## 2022-06-16 NOTE — Progress Notes (Signed)
Physical Therapy Treatment Patient Details Name: Tammy [name] MRN: [id number] DOB: 01-Oct-1955 Today's Date: 06/16/2022   History of Present Illness 67 y.o. 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 She is s/p left distal femur ORIF fracture 04/10/21 with Dr. 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 s/p removal of implant and replacement of L distal femur 04/30/22. Now admitted 06/04/22 with necrotic wound s/p surgical debridement. And s/p repeat debridement with exchange of rotating hinge bearing 06/07/22. s/p plastic surgery procedure (application of skin substitute)  06/15/22. PMH: bipolar, CVA.    PT Comments    Noted pt had plastic surgery procedure yesterday. Pt tolerated increased ambulation distance of 140' with RW and L KI today, no loss of balance. She is able to perform a L SLR independently. Pt is performing strengthening exercises independently. Good progression with activity tolerance today. Will plan to do stair training tomorrow.     Recommendations for follow up therapy are one component of a multi-disciplinary discharge planning process, led by the attending physician.  Recommendations may be updated based on patient status, additional functional criteria and insurance authorization.  Follow Up Recommendations       Assistance Recommended at Discharge    Patient can return home with the following A little help with bathing/dressing/bathroom;Assist for transportation;Help with stairs or ramp for entrance;Assistance with cooking/housework;A little help with walking and/or transfers   Equipment Recommendations  None recommended by PT    Recommendations for Other Services       Precautions / Restrictions  Precautions Precautions: Fall Precaution Comments: wound vac L knee; no L knee flexion Required Braces or Orthoses: Knee Immobilizer - Left Knee Immobilizer - Left: On at all times Restrictions Weight Bearing Restrictions: No LLE Weight Bearing: Weight bearing as tolerated     Mobility  Bed Mobility Overal bed mobility: Modified Independent       Supine to sit: HOB elevated, Modified independent (Device/Increase time)     General bed mobility comments: pt used gait belt as LLE lifter    Transfers Overall transfer level: Modified independent Equipment used: Rolling walker (2 wheels) Transfers: Sit to/from Stand Sit to Stand: Modified independent (Device/Increase time)           General transfer comment: good hand placement    Ambulation/Gait Ambulation/Gait assistance: Supervision Gait Distance (Feet): 140 Feet Assistive device: Rolling walker (2 wheels) Gait Pattern/deviations: Step-to pattern, Decreased stance time - left, Antalgic Gait velocity: decr     General Gait Details: KI on L knee, steady, no loss of balance.   Stairs             Wheelchair Mobility    Modified Rankin (Stroke Patients Only)       Balance Overall balance assessment: Needs assistance   Sitting balance-Leahy Scale: Good     Standing balance support: Bilateral upper extremity supported, During functional activity, Reliant on assistive device for balance Standing balance-Leahy Scale: Poor                              Cognition Arousal/Alertness: Awake/alert Behavior During Therapy: WFL for tasks assessed/performed Overall Cognitive Status: Within Functional Limits for tasks assessed  General Comments: very pleasant        Exercises      General Comments        Pertinent Vitals/Pain Pain Assessment Pain Score: 5  Pain Location: L knee with walking Pain Descriptors / Indicators: Sore, Aching Pain  Intervention(s): Limited activity within patient's tolerance, Monitored during session, Premedicated before session    Home Living                          Prior Function            PT Goals (current goals can now be found in the care plan section) Acute Rehab PT Goals Patient Stated Goal: go to [place], get married without using a walker PT Goal Formulation: With patient Time For Goal Achievement: 06/23/22 Potential to Achieve Goals: Good    Frequency    Min 1X/week      PT Plan Current plan remains appropriate    Co-evaluation              AM-PAC PT "6 Clicks" Mobility   Outcome Measure  Help needed turning from your back to your side while in a flat bed without using bedrails?: None Help needed moving from lying on your back to sitting on the side of a flat bed without using bedrails?: None Help needed moving to and from a bed to a chair (including a wheelchair)?: None Help needed standing up from a chair using your arms (e.g., wheelchair or bedside chair)?: None Help needed to walk in hospital room?: A Little Help needed climbing 3-5 steps with a railing? : A Little 6 Click Score: 22    End of Session Equipment Utilized During Treatment: Gait belt Activity Tolerance: Patient tolerated treatment well Patient left: in chair;with call bell/phone within reach Nurse Communication: Mobility status PT Visit Diagnosis: Difficulty in walking, not elsewhere classified (R26.2);Pain Pain - Right/Left: Left Pain - part of body: Knee     Time: 9604-5409 PT Time Calculation (min) (ACUTE ONLY): 25 min  Charges:  $Gait Training: 8-22 mins $Therapeutic Exercise: 8-22 mins                     Ralene Bathe Kistler PT 06/16/2022  Acute Rehabilitation Services  Office [phone]

## 2022-06-16 NOTE — Progress Notes (Cosign Needed Addendum)
1 Day Post-Op  Subjective: Patient is a 67 year old female with history of a left knee wound.  Patient underwent preparation of left knee wound for placement of myriad 5 x 10 cm sheet and 1 g of myriad powder and wound VAC placement with Dr. Ulice Bold yesterday, 06/15/2022.  Patient is postop day 1 today.  Upon entering the room, patient is sitting upright in her chair in no acute distress.  She reports that she is doing well and feels little bit better since surgery.  She does states she sometimes feels an intermittent burning pain.  She denies any other issues or concerns.  She reports she has been ambulating without issue.  Patient states that she will hopefully be discharged within the next few days.  Objective: Vital signs in last 24 hours: Temp:  [98.1 F (36.7 C)-99.4 F (37.4 C)] 98.1 F (36.7 C) (05/07 0448) Pulse Rate:  [58-71] 64 (05/07 0448) Resp:  [11-20] 17 (05/07 0448) BP: (129-154)/(63-103) 143/65 (05/07 0448) SpO2:  [92 %-100 %] 100 % (05/07 0448) Weight:  [76.2 kg] 76.2 kg (05/06 1557) Last BM Date : 06/14/22  Intake/Output from previous day: 05/06 0701 - 05/07 0700 In: 1526.8 [P.O.:480; I.V.:646.8; IV Piggyback:400] Out: 1410 [Urine:1400; Blood:10] Intake/Output this shift: Total I/O In: 240 [P.O.:240] Out: -   General appearance: alert, cooperative, and no distress Resp: Unlabored breathing, no respiratory distress Extremities: Left lower extremity is noted to have wound VAC in place.  Dressings over the wound VAC were clean dry and intact.  These were removed.  Wound VAC is noted to have good seal with adequate suction.  There is minimal serosanguineous drainage in the canister.  Patient's leg was rewrapped with Kerlix and Ace wrap.  Lab Results:     Latest Ref Rng & Units 06/16/2022    4:54 AM 06/13/2022    4:22 AM 06/12/2022    3:17 AM  CBC  WBC 4.0 - 10.5 K/uL 7.2  5.6  5.6   Hemoglobin 12.0 - 15.0 g/dL 8.7  8.3  8.1   Hematocrit 36.0 - 46.0 % 27.9  26.9   25.3   Platelets 150 - 400 K/uL 331  326  325     BMET Recent Labs    06/15/22 0154 06/16/22 0454  NA 137 136  K 3.5 3.9  CL 104 102  CO2 25 24  GLUCOSE 118* 136*  BUN 22 19  CREATININE 0.74 0.72  CALCIUM 8.7* 8.9   PT/INR No results for input(s): "LABPROT", "INR" in the last 72 hours. ABG No results for input(s): "PHART", "HCO3" in the last 72 hours.  Invalid input(s): "PCO2", "PO2"  Studies/Results: No results found.  Anti-infectives: Anti-infectives (From admission, onward)    Start     Dose/Rate Route Frequency Ordered Stop   06/11/22 1200  ampicillin (OMNIPEN) 2 g in sodium chloride 0.9 % 100 mL IVPB        2 g 300 mL/hr over 20 Minutes Intravenous Every 4 hours 06/11/22 1021     06/09/22 2000  Ampicillin-Sulbactam (UNASYN) 3 g in sodium chloride 0.9 % 100 mL IVPB  Status:  Discontinued        3 g 200 mL/hr over 30 Minutes Intravenous Every 6 hours 06/09/22 1345 06/11/22 1021   06/07/22 0730  ceFAZolin (ANCEF) IVPB 2g/100 mL premix        2 g 200 mL/hr over 30 Minutes Intravenous On call to O.R. 06/07/22 0631 06/07/22 0749   06/07/22 1610  ceFAZolin (ANCEF) 2-4  GM/100ML-% IVPB       Note to Pharmacy: Myrlene Broker M: cabinet override      06/07/22 1610 06/07/22 0825   06/05/22 0600  ceFAZolin (ANCEF) IVPB 2g/100 mL premix  Status:  Discontinued        2 g 200 mL/hr over 30 Minutes Intravenous On call to O.R. 06/04/22 1314 06/04/22 1317   06/05/22 0500  vancomycin (VANCOREADY) IVPB 1250 mg/250 mL  Status:  Discontinued        1,250 mg 166.7 mL/hr over 90 Minutes Intravenous Every 24 hours 06/04/22 1829 06/09/22 1345   06/04/22 2000  piperacillin-tazobactam (ZOSYN) IVPB 3.375 g  Status:  Discontinued        3.375 g 12.5 mL/hr over 240 Minutes Intravenous Every 8 hours 06/04/22 1819 06/09/22 1345   06/04/22 1900  ceFAZolin (ANCEF) IVPB 2g/100 mL premix  Status:  Discontinued        2 g 200 mL/hr over 30 Minutes Intravenous Every 6 hours 06/04/22 1802 06/04/22  1810   06/04/22 1330  vancomycin (VANCOCIN) IVPB 1000 mg/200 mL premix        1,000 mg 200 mL/hr over 60 Minutes Intravenous On call to O.R. 06/04/22 1314 06/04/22 1423   06/04/22 1330  ceFAZolin (ANCEF) IVPB 2g/100 mL premix        2 g 200 mL/hr over 30 Minutes Intravenous On call to O.R. 06/04/22 1317 06/04/22 1528       Assessment/Plan: s/p Procedure(s): excision of knee wound with Myriad APPLICATION OF SKIN SUBSTITUTE APPLICATION OF WOUND VAC Plan: -Patient doing well postop from myriad placement and wound VAC placement. Patient to continue wound vac.  -We will plan to have the patient follow-up in the clinic next Monday or Tuesday for wound VAC change and evaluation.  I discussed this plan with the patient and she acknowledged it. -I instructed the patient to call our clinic if she has any issues or questions regarding the wound VAC when she goes home. -Please feel free to contact our service with any further questions or concerns.  LOS: 12 days    [name], PA-C 06/16/2022

## 2022-06-16 NOTE — TOC Progression Note (Signed)
Transition of Care [facility]) - Progression Note    Patient Details  Name: Tammy [name] MRN: [id number] Date of Birth: Jul 21, 1955  [facility]) CM/SW Contact  Amada Jupiter, LCSW Phone Number: 06/16/2022, 1:09 PM  Clinical Narrative:     Touching base with pt today to begin preparing for discharge.  She is not clear on anticipated dc date and awaiting more information from MDs involved.  Jeri Modena with [employer] is following along as well to coordinate IV abx coverage/ teaching when she does dc.  Awaiting decision if she will need [facility] VAC at discharge.    Expected Discharge Plan: Home w Home Health Services Barriers to Discharge: Continued Medical Work up  Expected Discharge Plan and Services In-house Referral: Clinical Social Work   Post Acute Care Choice: Home Health Living arrangements for the past 2 months: Single Family Home                                       Social Determinants of Health (SDOH) Interventions SDOH Screenings   Food Insecurity: No Food Insecurity (06/04/2022)  Housing: Low Risk  (06/04/2022)  Transportation Needs: No Transportation Needs (06/04/2022)  Utilities: Not At Risk (06/04/2022)  Tobacco Use: Medium Risk (06/16/2022)    Readmission Risk Interventions    06/05/2022    2:21 PM 05/01/2022    1:55 PM  Readmission Risk Prevention Plan  Post Dischage Appt  Complete  Medication Screening  Complete  Transportation Screening Complete Complete  PCP or Specialist Appt within 5-7 Days Complete   Home Care Screening Complete   Medication Review (RN CM) Complete

## 2022-06-17 ENCOUNTER — Ambulatory Visit: Payer: Medicare Other | Admitting: Physical Therapy

## 2022-06-17 MED ORDER — SENNA 8.6 MG PO TABS: 2.0000 | ORAL_TABLET | Freq: Every day | ORAL | 0 refills | Status: AC

## 2022-06-17 MED ORDER — DOCUSATE SODIUM 100 MG PO CAPS: 100.0000 mg | ORAL_CAPSULE | Freq: Two times a day (BID) | ORAL | 0 refills | Status: AC

## 2022-06-17 MED ORDER — ONDANSETRON HCL 4 MG PO TABS: 4.0000 mg | ORAL_TABLET | Freq: Three times a day (TID) | ORAL | 0 refills | Status: DC | PRN

## 2022-06-17 MED ORDER — ACETAMINOPHEN 500 MG PO TABS: 1000.0000 mg | ORAL_TABLET | Freq: Three times a day (TID) | ORAL | 0 refills | Status: DC | PRN

## 2022-06-17 MED ORDER — POLYETHYLENE GLYCOL 3350 17 G PO PACK: 17.0000 g | PACK | Freq: Every day | ORAL | 0 refills | Status: AC | PRN

## 2022-06-17 MED ORDER — ASPIRIN 81 MG PO CHEW: 81.0000 mg | CHEWABLE_TABLET | Freq: Two times a day (BID) | ORAL | 0 refills | Status: AC

## 2022-06-17 MED ORDER — OXYCODONE HCL 5 MG PO TABS: 5.0000 mg | ORAL_TABLET | Freq: Four times a day (QID) | ORAL | 0 refills | Status: DC | PRN

## 2022-06-17 MED ORDER — AMPICILLIN IV (FOR PTA / DISCHARGE USE ONLY)
INTRAVENOUS | 0 refills | Status: AC
Start: 2022-06-17 — End: 2022-07-24

## 2022-06-17 NOTE — Progress Notes (Signed)
Brief Nutrition Education Note  Received secure chat from social worker that patient had questions regarding diet recommendations for upcoming discharge tomorrow.   Met with patient at bedside who was inquiring about general healthful eating and potassium content of foods.   Provided General, Healthful Nutrition Therapy diet education with handout and discussed including lean meats, fresh fruits and vegetables, fiber-rich foods, and adequate fluid intake.  Provided Potassium Content of Foods diet education handout (previously added to AVS) and reviewed with patient as well.   Patient also interested in wound healing diet education so provided Eating Well for Wound Healing diet education with handout.   Patient very appreciative of information and handouts. No additional questions or concerns.  Shelle Iron RD, LDN For contact information, refer to [facility].

## 2022-06-17 NOTE — Progress Notes (Signed)
    Subjective:  Patient reports pain as mild to moderate.  Denies N/V/CP/SOB/Abd pain. Patient states she is doing well this morning.   We discussed working on getting her discharged with HHRN and HHPT. We will get the process started for both and get a home wound vac.   Objective:   VITALS:   Vitals:   06/16/22 0448 06/16/22 1546 06/16/22 2201 06/17/22 0522  BP: (!) 143/65 139/68 137/76 (!) 151/70  Pulse: 64 66 65 (!) 54  Resp: 17 17 17 17   Temp: 98.1 F (36.7 C) 99.4 F (37.4 C) 98.1 F (36.7 C) 98.6 F (37 C)  TempSrc: Oral Oral Oral Oral  SpO2: 100% 100% 100% 99%  Weight:      Height:        Patient sitting up in bed. NAD.  Neurologically intact ABD soft Neurovascular intact Sensation intact distally Intact pulses distally Dorsiflexion/Plantar flexion intact No cellulitis present Compartment soft Wound vac no leaks detected. . 100 cc SS fluid in canister this morning.  Knee immobilizer on.   Lab Results  Component Value Date   WBC 7.2 06/16/2022   HGB 8.7 (L) 06/16/2022   HCT 27.9 (L) 06/16/2022   MCV 94.6 06/16/2022   PLT 331 06/16/2022   BMET    Component Value Date/Time   NA 136 06/16/2022 0454   K 3.9 06/16/2022 0454   CL 102 06/16/2022 0454   CO2 24 06/16/2022 0454   GLUCOSE 136 (H) 06/16/2022 0454   BUN 19 06/16/2022 0454   CREATININE 0.72 06/16/2022 0454   CREATININE 0.76 07/10/2020 1220   CALCIUM 8.9 06/16/2022 0454   GFRNONAA >60 06/16/2022 0454   GFRNONAA >60 07/10/2020 1220     Assessment/Plan: 2 Days Post-Op   Principal Problem:   Surgical wound dehiscence, initial encounter Active Problems:   Hypertension   Bipolar disorder, unspecified (HCC)   Malignant neoplasm of upper-outer quadrant of left breast in female, estrogen receptor positive (HCC)   History of CVA (cerebrovascular accident)   Acute postoperative anemia due to expected blood loss   Hypokalemia   Chronic hepatitis C with cirrhosis (HCC)   WBAT with  walker and knee immobilizer.  DVT ppx: Lovenox, SCDs, TEDS PO pain control PT/OT: Continue PT. She states she is going to do stairs today.  Dispo:  - Nutrition consult completed.  [facility] consulting for medical management of patient. Appreciate their assistance.  - Continue wound vac and hemovac drain. - Patient to follow-up with Dr. Ulice Bold Monday for wound vac care. Patient already as appointment Monday for 3:30pm.   - Infectious disease  recommends ampicillin 12g IV for 6 weeks. She'll need long term suppressive amoxicillin after 6 weeks of ampicillin, and can start clock on 4/28 .  - Orders placed with HHRN and HHPT.  - House wound vac ordered today.  - Patient likely d/c tomorrow pending wound vac.  - Patient will follow-up with orthopedics in 4 weeks in office.    Clois Dupes, PA-C 06/17/2022, 8:35 AM   [facility]  [place] [address], [place], [place] [address] Phone: [phone] [email or web address] Facebook  [employer]

## 2022-06-17 NOTE — TOC Transition Note (Signed)
Transition of Care [facility]) - CM/SW Discharge Note   Patient Details  Name: Tammy [name] MRN: [id number] Date of Birth: 04-30-1955  Transition of Care [facility]) CM/SW Contact:  Amada Jupiter, LCSW Phone Number: 06/17/2022, 1:34 PM   Clinical Narrative:     Met with pt to discuss anticipated discharge home tomorrow and need for wound VAC, IV abx and HHRN/PT coverage.  Pt aware and agreeable with all and has no agency preferences.  [facility] wound VAC has been ordered by Dr. Ulice Bold and should be delivered to room by tomorrow morning.  IV abx set up with [employer] and teaching will be done with pt prior to discharge by Jeri Modena, RN.  Able to secure HHRN/ PT coverage with [facility].  Information on AVS.  TOC will sign off but monitor until discharge.  Final next level of care: Home w Home Health Services Barriers to Discharge: Barriers Resolved   Patient Goals and CMS Choice      Discharge Placement                         Discharge Plan and Services Additional resources added to the After Visit Summary for   In-house Referral: Clinical Social Work   Post Acute Care Choice: Home Health          DME Arranged: Negative pressure wound device DME Agency: [employer] Date DME Agency Contacted: 06/17/22 Time DME Agency Contacted: 0900 Representative spoke with at DME Agency: [facility] HH Arranged: RN, PT, IV Antibiotics HH Agency: [facility] Date [facility] Agency Contacted: 06/17/22 Time HH Agency Contacted: 1000 Representative spoke with at [facility] Agency: [employer] - Jeri Modena, RN;   [facility] [place]  Social Determinants of Health (SDOH) Interventions SDOH Screenings   Food Insecurity: No Food Insecurity (06/04/2022)  Housing: Low Risk  (06/04/2022)  Transportation Needs: No Transportation Needs (06/04/2022)  Utilities: Not At Risk (06/04/2022)  Tobacco Use: Medium Risk (06/16/2022)     Readmission Risk Interventions    06/05/2022    2:21 PM  05/01/2022    1:55 PM  Readmission Risk Prevention Plan  Post Dischage Appt  Complete  Medication Screening  Complete  Transportation Screening Complete Complete  PCP or Specialist Appt within 5-7 Days Complete   Home Care Screening Complete   Medication Review (RN CM) Complete

## 2022-06-17 NOTE — Progress Notes (Signed)
Physical Therapy Treatment Patient Details Name: Tammy [name] MRN: [id number] DOB: Aug 16, 1955 Today's Date: 06/17/2022   History of Present Illness 67 y.o. 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 She is s/p left distal femur ORIF fracture 04/10/21 with Dr. 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 s/p removal of implant and replacement of L distal femur 04/30/22. Now admitted 06/04/22 with necrotic wound s/p surgical debridement. And s/p repeat debridement with exchange of rotating hinge bearing 06/07/22. s/p plastic surgery procedure (application of skin substitute)  06/15/22. PMH: bipolar, CVA.    PT Comments    Pt is progressing well with mobility. She tolerated increased ambulation distance of 180' with RW and L KI today, no loss of balance. Stair training completed. Pt is performing HEP independently. She is ready to DC home from a PT standpoint.    Recommendations for follow up therapy are one component of a multi-disciplinary discharge planning process, led by the attending physician.  Recommendations may be updated based on patient status, additional functional criteria and insurance authorization.  Follow Up Recommendations       Assistance Recommended at Discharge    Patient can return home with the following A little help with bathing/dressing/bathroom;Assist for transportation;Help with stairs or ramp for entrance;Assistance with cooking/housework;A little help with walking and/or transfers   Equipment Recommendations  None recommended by PT    Recommendations for Other Services       Precautions / Restrictions Precautions Precautions: Fall Precaution Comments: wound vac L knee; no L knee flexion Required Braces  or Orthoses: Knee Immobilizer - Left Knee Immobilizer - Left: On at all times Restrictions Weight Bearing Restrictions: No LLE Weight Bearing: Weight bearing as tolerated     Mobility  Bed Mobility Overal bed mobility: Modified Independent       Supine to sit: HOB elevated, Modified independent (Device/Increase time)          Transfers Overall transfer level: Modified independent Equipment used: Rolling walker (2 wheels) Transfers: Sit to/from Stand Sit to Stand: Modified independent (Device/Increase time)           General transfer comment: good hand placement    Ambulation/Gait Ambulation/Gait assistance: Modified independent (Device/Increase time) Gait Distance (Feet): 180 Feet Assistive device: Rolling walker (2 wheels) Gait Pattern/deviations: Step-to pattern, Decreased stance time - left Gait velocity: decr     General Gait Details: KI on L knee, steady, no loss of balance. Assist to manage wound VAC.   Stairs     Stair Management: One rail Left, With cane, Step to pattern, Forwards Number of Stairs: 3 General stair comments: verbal cues for sequence and safety; no LOB   Wheelchair Mobility    Modified Rankin (Stroke Patients Only)       Balance Overall balance assessment: Needs assistance   Sitting balance-Leahy Scale: Good     Standing balance support: Bilateral upper extremity supported, During functional activity, Reliant on assistive device for balance Standing balance-Leahy Scale: Poor                              Cognition Arousal/Alertness: Awake/alert Behavior During Therapy: WFL for tasks assessed/performed Overall Cognitive Status: Within Functional Limits for tasks assessed  General Comments: very pleasant        Exercises      General Comments        Pertinent Vitals/Pain Pain Assessment Pain Score: 5  Pain Location: L knee with walking Pain Descriptors  / Indicators: Sore, Aching Pain Intervention(s): Limited activity within patient's tolerance, Monitored during session, Premedicated before session    Home Living                          Prior Function            PT Goals (current goals can now be found in the care plan section) Acute Rehab PT Goals Patient Stated Goal: go to [place], get married without using a walker PT Goal Formulation: With patient Time For Goal Achievement: 06/23/22 Potential to Achieve Goals: Good Progress towards PT goals: Progressing toward goals    Frequency    Min 1X/week      PT Plan Current plan remains appropriate    Co-evaluation              AM-PAC PT "6 Clicks" Mobility   Outcome Measure  Help needed turning from your back to your side while in a flat bed without using bedrails?: None Help needed moving from lying on your back to sitting on the side of a flat bed without using bedrails?: None Help needed moving to and from a bed to a chair (including a wheelchair)?: None Help needed standing up from a chair using your arms (e.g., wheelchair or bedside chair)?: None Help needed to walk in hospital room?: None Help needed climbing 3-5 steps with a railing? : A Little 6 Click Score: 23    End of Session Equipment Utilized During Treatment: Gait belt Activity Tolerance: Patient tolerated treatment well Patient left: in chair;with call bell/phone within reach Nurse Communication: Mobility status PT Visit Diagnosis: Difficulty in walking, not elsewhere classified (R26.2);Pain Pain - Right/Left: Left Pain - part of body: Knee     Time: 1610-9604 PT Time Calculation (min) (ACUTE ONLY): 21 min  Charges:  $Gait Training: 8-22 mins                     Ralene Bathe Kistler PT 06/17/2022  Acute Rehabilitation Services  Office [phone]

## 2022-06-17 NOTE — Plan of Care (Signed)
?  Problem: Activity: ?Goal: Risk for activity intolerance will decrease ?Outcome: Progressing ?  ?Problem: Safety: ?Goal: Ability to remain free from injury will improve ?Outcome: Progressing ?  ?Problem: Pain Managment: ?Goal: General experience of comfort will improve ?Outcome: Progressing ?  ?

## 2022-06-18 NOTE — Progress Notes (Addendum)
    Subjective:  Patient reports pain as mild to moderate.  Denies N/V/CP/SOB/Abd pain. Patient states she is doing well this morning.    We are waiting on her home wound vac.   Objective:   VITALS:   Vitals:   06/17/22 0522 06/17/22 1351 06/17/22 2155 06/18/22 0514  BP: (!) 151/70 (!) 146/70 (!) 163/78 (!) 149/66  Pulse: (!) 54 66 68 (!) 54  Resp: 17 14 17 17   Temp: 98.6 F (37 C) 98.8 F (37.1 C) 98.5 F (36.9 C) 97.9 F (36.6 C)  TempSrc: Oral Oral Oral Oral  SpO2: 99% 100% 100% 98%  Weight:      Height:        Patient sitting up in bed. NAD.  Neurologically intact ABD soft Neurovascular intact Sensation intact distally Intact pulses distally Dorsiflexion/Plantar flexion intact No cellulitis present Compartment soft Wound vac no leaks detected. . 125 cc SS fluid in canister this morning.  Knee immobilizer on.   Lab Results  Component Value Date   WBC 7.2 06/16/2022   HGB 8.7 (L) 06/16/2022   HCT 27.9 (L) 06/16/2022   MCV 94.6 06/16/2022   PLT 331 06/16/2022   BMET    Component Value Date/Time   NA 136 06/16/2022 0454   K 3.9 06/16/2022 0454   CL 102 06/16/2022 0454   CO2 24 06/16/2022 0454   GLUCOSE 136 (H) 06/16/2022 0454   BUN 19 06/16/2022 0454   CREATININE 0.72 06/16/2022 0454   CREATININE 0.76 07/10/2020 1220   CALCIUM 8.9 06/16/2022 0454   GFRNONAA >60 06/16/2022 0454   GFRNONAA >60 07/10/2020 1220     Assessment/Plan: 3 Days Post-Op   Principal Problem:   Surgical wound dehiscence, initial encounter Active Problems:   Hypertension   Bipolar disorder, unspecified (HCC)   Malignant neoplasm of upper-outer quadrant of left breast in female, estrogen receptor positive (HCC)   History of CVA (cerebrovascular accident)   Acute postoperative anemia due to expected blood loss   Hypokalemia   Chronic hepatitis C with cirrhosis (HCC)   WBAT with walker and knee immobilizer.  DVT ppx: Lovenox, SCDs, TEDS PO pain control PT/OT:  Continue PT. She states she is going to do stairs today.  Dispo:  - Nutrition consult completed.  [facility] consulting for medical management of patient. Appreciate their assistance.  - Continue wound vac and hemovac drain. - Patient to follow-up with Dr. Ulice Bold Monday for wound vac care. Patient already as appointment Monday for 3:30pm.   - Infectious disease recommends ampicillin 12g IV for 6 weeks. She'll need long term suppressive amoxicillin after 6 weeks of ampicillin, and can start clock on 4/28 .  - Orders placed with HHRN and HHPT.  - House wound vac ordered today.  - Patient d/c today pending home wound vac.  - Patient will follow-up with orthopedics in 4 weeks in office.    Clois Dupes, PA-C 06/18/2022, 12:44 PM  [facility]  [place] [address], [address], [place], [place] [address] Phone: [phone] [email or web address] Facebook  [employer]

## 2022-06-18 NOTE — Progress Notes (Addendum)
Physical Therapy Treatment Patient Details Name: Tammy [name] MRN: [id number] DOB: 07-09-1955 Today's Date: 06/18/2022   History of Present Illness 67 y.o. 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 She is s/p left distal femur ORIF fracture 04/10/21 with Dr. 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 s/p removal of implant and replacement of L distal femur 04/30/22. Now admitted 06/04/22 with necrotic wound s/p surgical debridement. And s/p repeat debridement with exchange of rotating hinge bearing 06/07/22. s/p plastic surgery procedure (application of skin substitute)  06/15/22. PMH: bipolar, CVA.    PT Comments    Pt is progressing well with mobility, she tolerated increased ambulation distance of 220' with RW and L KI, no loss of balance. Pt is performing HEP independently. She is ready to DC home from a PT standpoint.   Recommendations for follow up therapy are one component of a multi-disciplinary discharge planning process, led by the attending physician.  Recommendations may be updated based on patient status, additional functional criteria and insurance authorization.  Follow Up Recommendations       Assistance Recommended at Discharge    Patient can return home with the following A little help with bathing/dressing/bathroom;Assist for transportation;Help with stairs or ramp for entrance;Assistance with cooking/housework;A little help with walking and/or transfers   Equipment Recommendations  None recommended by PT    Recommendations for Other Services       Precautions / Restrictions Precautions Precautions: Fall Precaution Comments: wound vac L knee; no L knee flexion Required Braces or Orthoses: Knee Immobilizer -  Left Knee Immobilizer - Left: On at all times Restrictions Weight Bearing Restrictions: No LLE Weight Bearing: Weight bearing as tolerated     Mobility  Bed Mobility Overal bed mobility: Modified Independent       Supine to sit: HOB elevated, Modified independent (Device/Increase time)          Transfers Overall transfer level: Modified independent Equipment used: Rolling walker (2 wheels) Transfers: Sit to/from Stand Sit to Stand: Modified independent (Device/Increase time)           General transfer comment: good hand placement    Ambulation/Gait Ambulation/Gait assistance: Modified independent (Device/Increase time) Gait Distance (Feet): 220 Feet Assistive device: Rolling walker (2 wheels) Gait Pattern/deviations: Step-to pattern, Decreased stance time - left Gait velocity: decr     General Gait Details: KI on L knee, steady, no loss of balance. Assist to manage wound VAC.   Stairs             Wheelchair Mobility    Modified Rankin (Stroke Patients Only)       Balance                                            Cognition Arousal/Alertness: Awake/alert Behavior During Therapy: WFL for tasks assessed/performed Overall Cognitive Status: Within Functional Limits for tasks assessed                                 General Comments: very pleasant        Exercises  General Comments        Pertinent Vitals/Pain Pain Assessment Pain Score: 4  Pain Location: L knee with walking Pain Descriptors / Indicators: Sore, Aching    Home Living                          Prior Function            PT Goals (current goals can now be found in the care plan section) Acute Rehab PT Goals Patient Stated Goal: go to [place], get married without using a walker PT Goal Formulation: With patient Time For Goal Achievement: 06/23/22 Potential to Achieve Goals: Good Progress towards PT goals: Progressing  toward goals    Frequency    Min 1X/week      PT Plan Current plan remains appropriate    Co-evaluation              AM-PAC PT "6 Clicks" Mobility   Outcome Measure  Help needed turning from your back to your side while in a flat bed without using bedrails?: None Help needed moving from lying on your back to sitting on the side of a flat bed without using bedrails?: None Help needed moving to and from a bed to a chair (including a wheelchair)?: None Help needed standing up from a chair using your arms (e.g., wheelchair or bedside chair)?: None Help needed to walk in hospital room?: None Help needed climbing 3-5 steps with a railing? : A Little 6 Click Score: 23    End of Session Equipment Utilized During Treatment: Gait belt Activity Tolerance: Patient tolerated treatment well Patient left: in chair;with call bell/phone within reach Nurse Communication: Mobility status PT Visit Diagnosis: Difficulty in walking, not elsewhere classified (R26.2);Pain Pain - Right/Left: Left Pain - part of body: Knee     Time: 8657-8469 PT Time Calculation (min) (ACUTE ONLY): 30 min  Charges:  $Gait Training: 8-22 mins $Therapeutic Activity: 8-22 mins                     Ralene Bathe Kistler PT 06/18/2022  Acute Rehabilitation Services  Office [phone]

## 2022-06-22 ENCOUNTER — Ambulatory Visit (INDEPENDENT_AMBULATORY_CARE_PROVIDER_SITE_OTHER): Payer: Medicare Other | Admitting: Student

## 2022-06-22 VITALS — BP 160/82 | HR 59

## 2022-06-22 DIAGNOSIS — T8131XA Disruption of external operation (surgical) wound, not elsewhere classified, initial encounter: Secondary | ICD-10-CM

## 2022-06-22 NOTE — Progress Notes (Signed)
Patient is a 67 year old female with history of a wound to her left knee.  Patient most recently underwent preparation of left knee wound for placement of myriad with Dr. Ulice Bold on 06/15/2022.  Intraoperatively, there was no sign of active purulence.  Sorbact was secured to the skin with Vicryl, and a white wound VAC sponge was applied over the Sorbact and then a black sponge over the incision site.  She is 1 week postop.  Patient presents to the clinic today for follow-up and wound VAC change.  Today, patient reports she is doing well.  She presents to the clinic today with her fianc and her sister.  She states that her wound VAC ran out of battery for an unknown period of time.  She states that she has had some drainage collected around the sponge and underneath the drapes.  She denies any fevers or chills.  She denies any other issues or concerns.  Chaperone present on exam.  On exam, patient is sitting upright in no acute distress.  Green Sorbact in place over the wound.  This was removed.  Wound to the left lower extremity is approximately 12 cm x 6.5 cm.  There appears to be some healthy appearing granulation tissue to the superior medial aspect of the wound.  Wound is otherwise covered in unincorporated myriad.  There is no surrounding erythema.  There is no active drainage on exam.  There are no signs of infection on exam.  Remainder of the patient's incision is intact with staples and suture..  Adaptic, K-Y jelly, wound VAC sponge and wound VAC drapes were placed here in the clinic today.  Wound VAC was set to 125 mmHg with an adequate seal.  Leg was then wrapped in Kerlix, and ABD pad over the remainder of her incision, and Ace wrap and her knee immobilizer was then placed.  I discussed with the patient that her wound VAC should be changed twice a week.  I discussed with her that she can either come to our clinic once a week, and home health care change the wound VAC 1 other day of the week.   Patient expressed understanding.  I discussed with the patient that she has to charge her wound VAC when she gets home to make sure that it remains.  I discussed with her that if she has any issues with the wound VAC she may call our office and we can help her with that.  Patient expressed understanding.  We will have the patient follow back up in 1 week for another wound VAC change.  I discussed with the patient that if she decides to have wound care change it twice a week, either Mondays and Thursdays or Tuesdays and Fridays, she can follow back up in about 2 weeks for reevaluation.  We will send the wound care instructions over to the patient's home health care.  I instructed the patient to call in the meantime if she has any questions or concerns about anything  Pictures were obtained of the patient and placed in the chart with the patient's or guardian's permission.

## 2022-06-22 NOTE — Discharge Summary (Signed)
Physician Discharge Summary  Patient ID: Tammy [name] MRN: [id number] DOB/AGE: 1955-09-29 67 y.o.  Admit date: 06/04/2022 Discharge date: 06/18/2022  Admission Diagnoses:  Surgical wound dehiscence, initial encounter  Discharge Diagnoses:  Principal Problem:   Surgical wound dehiscence, initial encounter Active Problems:   Hypertension   Bipolar disorder, unspecified (HCC)   Malignant neoplasm of upper-outer quadrant of left breast in female, estrogen receptor positive (HCC)   History of CVA (cerebrovascular accident)   Acute postoperative anemia due to expected blood loss   Hypokalemia   Chronic hepatitis C with cirrhosis (HCC)   Past Medical History:  Diagnosis Date   Anemia    hx of   Anxiety    on meds   Arthritis    back/bilateral ankles   Bipolar disorder (HCC)    Breast cancer (HCC) 2022   LEFT breast-radiation   Depression    on meds   Family history of breast cancer 07/10/2020   Family history of colon cancer 07/10/2020   Family history of ovarian cancer 07/10/2020   GERD (gastroesophageal reflux disease)    on meds   Heart murmur    dx by PCP   Hepatitis C    HTN (hypertension)    on meds   Hx of radiation therapy    Liver cirrhosis (HCC)    from hep c   Neuromuscular disorder (HCC)    Osteopenia    Stroke (HCC) 09/2012   slurred speech and LEFT sided weakness/notes 10/04/2012    Surgeries:  Irrigation and debridement left knee with application of wound vac 06/04/22. Irrigation and debridement with polyliner exchange and wound vac placement 06/07/22.  Procedure(s): excision of knee wound with Myriad APPLICATION OF SKIN SUBSTITUTE APPLICATION OF WOUND VAC on 06/15/2022   Consultants (if any): Treatment Team:  Meredeth Ide, MD  Discharged Condition: Improved  Hospital Course: Tammy [name] is an 67 y.o. female who was admitted 06/04/2022 with a diagnosis of Surgical wound dehiscence, initial encounter and went to the operating  room on 06/04/22, 06/07/22, and 06/15/2022 and underwent the above named procedures.    She was given perioperative antibiotics:  Anti-infectives (From admission, onward)    Start     Dose/Rate Route Frequency Ordered Stop   06/17/22 0000  ampicillin IVPB           06/17/22 1156 07/24/22 2359   06/11/22 1200  ampicillin (OMNIPEN) 2 g in sodium chloride 0.9 % 100 mL IVPB  Status:  Discontinued        2 g 300 mL/hr over 20 Minutes Intravenous Every 4 hours 06/11/22 1021 06/18/22 2237   06/09/22 2000  Ampicillin-Sulbactam (UNASYN) 3 g in sodium chloride 0.9 % 100 mL IVPB  Status:  Discontinued        3 g 200 mL/hr over 30 Minutes Intravenous Every 6 hours 06/09/22 1345 06/11/22 1021   06/07/22 0730  ceFAZolin (ANCEF) IVPB 2g/100 mL premix        2 g 200 mL/hr over 30 Minutes Intravenous On call to O.R. 06/07/22 0631 06/07/22 0749   06/07/22 0102  ceFAZolin (ANCEF) 2-4 GM/100ML-% IVPB       Note to Pharmacy: Myrlene Broker M: cabinet override      06/07/22 0633 06/07/22 0825   06/05/22 0600  ceFAZolin (ANCEF) IVPB 2g/100 mL premix  Status:  Discontinued        2 g 200 mL/hr over 30 Minutes Intravenous On call to O.R. 06/04/22 1314 06/04/22 1317   06/05/22  0500  vancomycin (VANCOREADY) IVPB 1250 mg/250 mL  Status:  Discontinued        1,250 mg 166.7 mL/hr over 90 Minutes Intravenous Every 24 hours 06/04/22 1829 06/09/22 1345   06/04/22 2000  piperacillin-tazobactam (ZOSYN) IVPB 3.375 g  Status:  Discontinued        3.375 g 12.5 mL/hr over 240 Minutes Intravenous Every 8 hours 06/04/22 1819 06/09/22 1345   06/04/22 1900  ceFAZolin (ANCEF) IVPB 2g/100 mL premix  Status:  Discontinued        2 g 200 mL/hr over 30 Minutes Intravenous Every 6 hours 06/04/22 1802 06/04/22 1810   06/04/22 1330  vancomycin (VANCOCIN) IVPB 1000 mg/200 mL premix        1,000 mg 200 mL/hr over 60 Minutes Intravenous On call to O.R. 06/04/22 1314 06/04/22 1423   06/04/22 1330  ceFAZolin (ANCEF) IVPB 2g/100 mL premix         2 g 200 mL/hr over 30 Minutes Intravenous On call to O.R. 06/04/22 1317 06/04/22 1528       She was given sequential compression devices, early ambulation, and aspirin for DVT prophylaxis.  4/26: Patients hemoglobin 6.5 and she was transfused 2 units PRBCs. Negative pressure dressing C/D/I, no leaks on with 200 cc output. Knee immobilizer on. Hypokalemia with K+ 2.8, given kdurr. Chemical DVT ppx held for repeat I&D with polyliner exchange for following day. Follow intraoperative cultures. Broad spectrum antibiotics continued.    4/27: Hemoglobin 8.7 and potassium 2.9, still not medically optimized for surgery. 2 units PRBCs ordered potassium continued. . Surgery postponed due to low hemoglobin and hypokalemia. TRH consulted for assistance with medical management of patient. Following intraoperative cultures. Chemical DVT ppx hold continued.   4/28: Patient taking to the OR for repeat I&D with polyliner exchange. New wound vac placed. Broad-spectrum IV antibiotics continued pending intraoperative cultures. Consulted Dr. Ulice Bold with plastic surgery, who plans to take the patient back to the OR during hospital admission. Cultures growing rare E. Faecalis.   4/29: Wound vac . 100 cc SS output. Hemovac drain dressing C/D/I. 60 cc bloody fluid. Continued drain. Hemoglobin 8.3, K+ 3.7, monitoring continued. Nutrition consult placed for optimal wound healing. Continue to follow cultures. PT started.   4/30: Pain medication adjusted for better pain control. Wound vac . 100 cc SS output. Hemovac drain dressing C/D/I. 15 cc bloody fluid. Continued drain. Hemoglobin 7.9, K+ 3.4, continued to monitor and potassium given . Continue to follow intraoperative cultures for susceptibilities. She continued to advance with PT. Infectious disease consulted, antibiotics changed to amp-sulbactam.  5/1:  Patient states pain improved with medication adjustment. She ambulated well with PT.  Wound vac 75 cc SS output. Hemovac drain 10 cc bloody fluid. Hemoglobin 9.2, potassium 3.5. Patient on OR schedule with Dr. Ulice Bold 06/15/22. Patient to remain in hospital until surgical intervention.  5/2: Wound vac and hemovac drain continued. She continued to ambulate with PT. Diarrhea with amp-sulb ID changed abx to ampicillin alone. She'll need long term suppressive amoxicillin after 6 weeks of ampicillin, and can start clock on 4/28.  5/3: Wound vac continued. Hemovac drain removed. PT continued. Ampicillin for IV antibiotics. PICC line placed. Hemoglobin 8.1, K+ 3.2, potassium given.   5/4: Wound vac continued. IV antibiotics continued. Potassium given as needed. Awaiting 5/6 for I&D with Dr. Ulice Bold. PT continued.   5/5: Wound vac continued. IV antibiotics continued. Potassium given as needed. Awaiting 5/6 for I&D with Dr. Ulice Bold. PT continued.  5/6: Patient taken to the OR with Dr. Ulice Bold placement of Myriad 5 x 10 cm sheet and 1 gm powder and wound vac applied. Medical management and IV antibiotics continued.   5/7: Wound vac continued. PT continued. Preparation for discharged started with [facility], HHPT, and home vac.   5/8 Medical management continued. Waiting on house vac for discharge home. PT continued.   5/9: Patient received HHRN, HHPT, and house vac. Discharged home with services. Patient to follow-up with Dr. Ulice Bold Monday for wound vac care. Patient already as appointment Monday for 3:30pm. Infectious disease  recommends ampicillin 12g IV for 6 weeks. She'll need long term suppressive amoxicillin after 6 weeks of ampicillin, and can start clock on 4/28. Patient will follow-up with orthopedics in 4 weeks in office.   She benefited maximally from the hospital stay and there were no complications.    Recent vital signs:  Vitals:   06/18/22 0514 06/18/22 1407  BP: (!) 149/66 (!) 148/63  Pulse: (!) 54 64  Resp: 17 17  Temp: 97.9 F (36.6 C) 98.2 F (36.8  C)  SpO2: 98% 100%    Recent laboratory studies:  Lab Results  Component Value Date   HGB 8.7 (L) 06/16/2022   HGB 8.3 (L) 06/13/2022   HGB 8.1 (L) 06/12/2022   Lab Results  Component Value Date   WBC 7.2 06/16/2022   PLT 331 06/16/2022   Lab Results  Component Value Date   INR 0.9 06/11/2022   Lab Results  Component Value Date   NA 136 06/16/2022   K 3.9 06/16/2022   CL 102 06/16/2022   CO2 24 06/16/2022   BUN 19 06/16/2022   CREATININE 0.72 06/16/2022   GLUCOSE 136 (H) 06/16/2022     Allergies as of 06/18/2022       Reactions   Lisinopril Cough        Medication List     STOP taking these medications    cefadroxil 500 MG capsule Commonly known as: DURICEF   HYDROcodone-acetaminophen 5-325 MG tablet Commonly known as: NORCO/VICODIN       TAKE these medications    acetaminophen 500 MG tablet Commonly known as: TYLENOL Take 2 tablets (1,000 mg total) by mouth every 8 (eight) hours as needed for moderate pain, mild pain, fever or headache. What changed:  when to take this reasons to take this   amLODipine 10 MG tablet Commonly known as: NORVASC Take 1 tablet (10 mg total) by mouth daily.   ampicillin  IVPB 2g IV every 4 hours Indication:  E faecalis  LLE SSI w/ hardware First Dose: Yes Last Day of Therapy:  07/19/22 Labs - Once weekly:  CBC/D and BMP, ESR and CRP Method of administration: Ambulatory Pump (Continuous Infusion) Pull PICC line at the completion of IV antibiotic therapy Method of administration may be changed at the discretion of home infusion pharmacist based upon assessment of the patient and/or caregiver's ability to self-administer the medication ordered.   anastrozole 1 MG tablet Commonly known as: ARIMIDEX TAKE 1 TABLET BY MOUTH DAILY   aspirin 81 MG chewable tablet Commonly known as: Aspirin Childrens Chew 1 tablet (81 mg total) by mouth 2 (two) times daily with a meal.   BENGAY EX Apply 1 Application topically daily  as needed (pain).   buPROPion 150 MG 12 hr tablet Commonly known as: WELLBUTRIN SR Take 150 mg by mouth 2 (two) times daily.   CALCIUM PO Take 500 mg by mouth daily.   cyanocobalamin  1000 MCG tablet Commonly known as: VITAMIN B12 Take 1,000 mcg by mouth daily.   docusate sodium 100 MG capsule Commonly known as: Colace Take 1 capsule (100 mg total) by mouth 2 (two) times daily.   gabapentin 100 MG capsule Commonly known as: NEURONTIN Take 1 capsule (100 mg total) by mouth 2 (two) times daily. What changed:  how much to take when to take this additional instructions   hydrALAZINE 25 MG tablet Commonly known as: APRESOLINE Take 1 tablet (25 mg total) by mouth 2 (two) times daily.   meloxicam 15 MG tablet Commonly known as: MOBIC Take 1 tablet (15 mg total) by mouth daily.   Multivitamin Tabs Take 1 tablet by mouth daily.   ondansetron 4 MG tablet Commonly known as: Zofran Take 1 tablet (4 mg total) by mouth every 8 (eight) hours as needed for nausea or vomiting.   oxyCODONE 5 MG immediate release tablet Commonly known as: Roxicodone Take 1-2 tablets (5-10 mg total) by mouth every 6 (six) hours as needed for severe pain or moderate pain.   pantoprazole 40 MG tablet Commonly known as: PROTONIX Take 1 tablet (40 mg total) by mouth daily.   polyethylene glycol 17 g packet Commonly known as: MiraLax Take 17 g by mouth daily as needed for mild constipation or moderate constipation.   potassium chloride 10 MEQ tablet Commonly known as: KLOR-CON Take 10 mEq by mouth daily.   Reglan 10 MG tablet Generic drug: metoCLOPramide Take 10 mg by mouth daily as needed for nausea.   senna 8.6 MG Tabs tablet Commonly known as: SENOKOT Take 2 tablets (17.2 mg total) by mouth at bedtime for 15 days. What changed:  how much to take when to take this   vitamin C 1000 MG tablet Take 1,000 mg by mouth daily.   zinc gluconate 50 MG tablet Take 50 mg by mouth daily.                Discharge Care Instructions  (From admission, onward)           Start     Ordered   06/18/22 0000  Weight bearing as tolerated       Comments: In knee immobilizer   06/18/22 1250   06/18/22 0000  Change dressing       Comments: Do not remove your dressing.   06/18/22 1250   06/17/22 0000  Change dressing on IV access line weekly and PRN  (Home infusion instructions - [facility] )        06/17/22 1156              WEIGHT BEARING   Weight bearing as tolerated with assist device (walker, cane, etc) as directed, use it as long as suggested by your surgeon or therapist, typically at least 4-6 weeks. And knee immobilizer at all times. Do not bend the knee.    EXERCISES  Results after joint replacement surgery are often greatly improved when you follow the exercise, range of motion and muscle strengthening exercises prescribed by your doctor. Safety measures are also important to protect the joint from further injury. Any time any of these exercises cause you to have increased pain or swelling, decrease what you are doing until you are comfortable again and then slowly increase them. If you have problems or questions, call your caregiver or physical therapist for advice.   Rehabilitation is important following a joint replacement. After just a few days of immobilization, the muscles of  the leg can become weakened and shrink (atrophy).  These exercises are designed to build up the tone and strength of the thigh and leg muscles and to improve motion. Often times heat used for twenty to thirty minutes before working out will loosen up your tissues and help with improving the range of motion but do not use heat for the first two weeks following surgery (sometimes heat can increase post-operative swelling).   These exercises can be done on a training (exercise) mat, on the floor, on a table or on a bed. Use whatever works the best and is most comfortable for you.     Use music or television while you are exercising so that the exercises are a pleasant break in your day. This will make your life better with the exercises acting as a break in your routine that you can look forward to.   Perform all exercises about fifteen times, three times per day or as directed.  You should exercise both the operative leg and the other leg as well.  Exercises include:   Quad Sets - Tighten up the muscle on the front of the thigh (Quad) and hold for 5-10 seconds.   Straight Leg Raises - With your knee straight (if you were given a brace, keep it on), lift the leg to 60 degrees, hold for 3 seconds, and slowly lower the leg.  Perform this exercise against resistance later as your leg gets stronger.  Leg Slides: Lying on your back, slowly slide your foot toward your buttocks, bending your knee up off the floor (only go as far as is comfortable). Then slowly slide your foot back down until your leg is flat on the floor again.  Angel Wings: Lying on your back spread your legs to the side as far apart as you can without causing discomfort.  Hamstring Strength:  Lying on your back, push your heel against the floor with your leg straight by tightening up the muscles of your buttocks.  Repeat, but this time bend your knee to a comfortable angle, and push your heel against the floor.  You may put a pillow under the heel to make it more comfortable if necessary.   A rehabilitation program following joint replacement surgery can speed recovery and prevent re-injury in the future due to weakened muscles. Contact your doctor or a physical therapist for more information on knee rehabilitation.    CONSTIPATION  Constipation is defined medically as fewer than three stools per week and severe constipation as less than one stool per week.  Even if you have a regular bowel pattern at home, your normal regimen is likely to be disrupted due to multiple reasons following surgery.  Combination of  anesthesia, postoperative narcotics, change in appetite and fluid intake all can affect your bowels.   YOU MUST use at least one of the following options; they are listed in order of increasing strength to get the job done.  They are all available over the counter, and you may need to use some, POSSIBLY even all of these options:    Drink plenty of fluids (prune juice may be helpful) and high fiber foods Colace 100 mg by mouth twice a day  Senokot for constipation as directed and as needed Dulcolax (bisacodyl), take with full glass of water  Miralax (polyethylene glycol) once or twice a day as needed.  If you have tried all these things and are unable to have a bowel movement in the first 3-4 days  after surgery call either your surgeon or your primary doctor.    If you experience loose stools or diarrhea, hold the medications until you stool forms back up.  If your symptoms do not get better within 1 week or if they get worse, check with your doctor.  If you experience "the worst abdominal pain ever" or develop nausea or vomiting, please contact the office immediately for further recommendations for treatment.   ITCHING:  If you experience itching with your medications, try taking only a single pain pill, or even half a pain pill at a time.  You can also use Benadryl over the counter for itching or also to help with sleep.   TED HOSE STOCKINGS:  Use stockings on both legs until for at least 2 weeks or as directed by physician office. They may be removed at night for sleeping.  MEDICATIONS:  See your medication summary on the "After Visit Summary" that nursing will review with you.  You may have some home medications which will be placed on hold until you complete the course of blood thinner medication.  It is important for you to complete the blood thinner medication as prescribed.  PRECAUTIONS:  If you experience chest pain or shortness of breath - call 911 immediately for transfer to the  hospital emergency department.   If you develop a fever greater that 101 F, purulent drainage from wound, increased redness or drainage from wound, foul odor from the wound/dressing, or calf pain - CONTACT YOUR SURGEON.                                                   FOLLOW-UP APPOINTMENTS:  If you do not already have a post-op appointment, please call the office for an appointment to be seen by your surgeon.  Guidelines for how soon to be seen are listed in your "After Visit Summary", but are typically between 1-4 weeks after surgery.  OTHER INSTRUCTIONS:   Knee Replacement:  Do not place pillow under knee, focus on keeping the knee straight while resting. CPM instructions: 0-90 degrees, 2 hours in the morning, 2 hours in the afternoon, and 2 hours in the evening. Place foam block, curve side up under heel at all times except when in CPM or when walking.  DO NOT modify, tear, cut, or change the foam block in any way.   MAKE SURE YOU:  Understand these instructions.  Get help right away if you are not doing well or get worse.    Thank you for letting us be a part of your medical care team.  It is a privilege we respect greatly.  We hope these instructions will help you stay on track for a fast and full recovery!   Diagnostic Studies: [place] EKG SITE RITE  Result Date: 06/12/2022 If Site Rite image not attached, placement could not be confirmed due to current cardiac rhythm.   Disposition: Discharge disposition: 01-Home or Self Care       Discharge Instructions     [facility] pharmacist to adjust dose for Vancomycin, Aminoglycosides and other anti-infective therapies as requested by physician.   Complete by: As directed    [facility] to provide Cath Flo 2mg    Complete by: As directed    Administer for PICC line occlusion and as ordered by physician for other  access device issues.   Anaphylaxis Kit: Provided to treat any anaphylactic reaction to the medication  being provided to the patient if First Dose or when requested by physician   Complete by: As directed    Epinephrine 1mg /ml vial / amp: Administer 0.3mg  (0.74ml) subcutaneously once for moderate to severe anaphylaxis, nurse to call physician and pharmacy when reaction occurs and call 911 if needed for immediate care   Diphenhydramine 50mg /ml IV vial: Administer 25-50mg  IV/IM PRN for first dose reaction, rash, itching, mild reaction, nurse to call physician and pharmacy when reaction occurs   Sodium Chloride 0.9% NS IV: Administer if needed for hypovolemic blood pressure drop or as ordered by physician after call to physician with anaphylactic reaction   Call MD / Call 911   Complete by: As directed    If you experience chest pain or shortness of breath, CALL 911 and be transported to the hospital emergency room.  If you develope a fever above 101 F, pus (white drainage) or increased drainage or redness at the wound, or calf pain, call your surgeon's office.   Change dressing   Complete by: As directed    Do not remove your dressing.   Change dressing on IV access line weekly and PRN   Complete by: As directed    Constipation Prevention   Complete by: As directed    Drink plenty of fluids.  Prune juice may be helpful.  You may use a stool softener, such as Colace (over the counter) 100 mg twice a day.  Use MiraLax (over the counter) for constipation as needed.   Diet - low sodium heart healthy   Complete by: As directed    Discharge instructions   Complete by: As directed    Elevate toes above nose. Use cryotherapy as needed for pain and swelling.  Do not attempt to bend the knee. Please wear your knee immobilizer.   Do not put a pillow under the knee. Place it under the heel.   Complete by: As directed    Driving restrictions   Complete by: As directed    No driving for 6 weeks   Face-to-face encounter (required for Medicare/Medicaid patients)   Complete by: As directed    I Clois Dupes certify that this patient is under my care and that I, or a nurse practitioner or physician's assistant working with me, had a face-to-face encounter that meets the physician face-to-face encounter requirements with this patient on 06/17/2022. The encounter with the patient was in whole, or in part for the following medical condition(s) which is the primary reason for home health care (List medical condition):   Surgical wound dehiscence left knee. Wound vac therapy. Patient cannot bend knee. Knee immobilizer at all times.   The encounter with the patient was in whole, or in part, for the following medical condition, which is the primary reason for home health care: surgical wound dehiscence left knee.   I certify that, based on my findings, the following services are medically necessary home health services: Physical therapy   Reason for Medically Necessary Home Health Services:  Therapy- Therapeutic Exercises to Increase Strength and Endurance Therapy- Instruction on Safe use of Assistive Devices for ADLs     My clinical findings support the need for the above services: OTHER SEE COMMENTS   Further, I certify that my clinical findings support that this patient is homebound due to: Pain interferes with ambulation/mobility   Flush IV access with Sodium Chloride  0.9% and Heparin 10 units/ml or 100 units/ml   Complete by: As directed    Home Health   Complete by: As directed    To provide the following care/treatments:  PT RN     Home infusion instructions - [facility]   Complete by: As directed    Instructions: Flush IV access with Sodium Chloride 0.9% and Heparin 10units/ml or 100units/ml   Change dressing on IV access line: Weekly and PRN   Instructions Cath Flo 2mg : Administer for PICC Line occlusion and as ordered by physician for other access device   [facility] pharmacist to adjust dose for: Vancomycin, Aminoglycosides and other anti-infective therapies as  requested by physician   Increase activity slowly as tolerated   Complete by: As directed    Lifting restrictions   Complete by: As directed    No lifting for 6 weeks   Method of administration may be changed at the discretion of home infusion pharmacist based upon assessment of the patient and/or caregiver's ability to self-administer the medication ordered   Complete by: As directed    Post-operative opioid taper instructions:   Complete by: As directed    POST-OPERATIVE OPIOID TAPER INSTRUCTIONS: It is important to wean off of your opioid medication as soon as possible. If you do not need pain medication after your surgery it is ok to stop day one. Opioids include: Codeine, Hydrocodone(Norco, Vicodin), Oxycodone(Percocet, oxycontin) and hydromorphone amongst others.  Long term and even short term use of opiods can cause: Increased pain response Dependence Constipation Depression Respiratory depression And more.  Withdrawal symptoms can include Flu like symptoms Nausea, vomiting And more Techniques to manage these symptoms Hydrate well Eat regular healthy meals Stay active Use relaxation techniques(deep breathing, meditating, yoga) Do Not substitute Alcohol to help with tapering If you have been on opioids for less than two weeks and do not have pain than it is ok to stop all together.  Plan to wean off of opioids This plan should start within one week post op of your joint replacement. Maintain the same interval or time between taking each dose and first decrease the dose.  Cut the total daily intake of opioids by one tablet each day Next start to increase the time between doses. The last dose that should be eliminated is the evening dose.      TED hose   Complete by: As directed    Use stockings (TED hose) for 2 weeks on both leg(s).  You may remove them at night for sleeping.   Weight bearing as tolerated   Complete by: As directed    In knee immobilizer         Follow-up Information     Raymondo Band, MD. Go on 07/09/2022.   Specialty: Infectious Diseases Why: Appointment for infectious disease 07/09/22 at 2:45pm. Contact information: [address] [place] [address] [phone]         Peggye Form, DO Follow up in 1 week(s).   Specialty: Plastic Surgery Contact information: [address] [place] [address] [phone]         [facility] Follow up.   Specialty: Home Health Services Why: to provide home nursing and physical therapy visits Contact information: [address] [place] [address] [phone]         Samson Frederic, MD. Schedule an appointment as soon as possible for a visit in 4 week(s).  Specialty: Orthopedic Surgery Why: For repeat clinical and radiographic evaluation. Contact information: [address] [place] [address] [phone]                  Signed: Clois Dupes 06/22/2022, 5:15 PM

## 2022-06-23 ENCOUNTER — Telehealth: Payer: Self-pay

## 2022-06-23 NOTE — Telephone Encounter (Signed)
Faxed wound care order to [facility] with demographics and most recent ov note included. Received fax success confirmation. Forwarding original document to front desk for batch scanning.

## 2022-06-25 ENCOUNTER — Encounter: Payer: Medicare Other | Admitting: Physical Therapy

## 2022-06-29 ENCOUNTER — Telehealth: Payer: Self-pay

## 2022-06-29 ENCOUNTER — Ambulatory Visit (INDEPENDENT_AMBULATORY_CARE_PROVIDER_SITE_OTHER): Payer: Medicare Other | Admitting: Student

## 2022-06-29 DIAGNOSIS — T8131XA Disruption of external operation (surgical) wound, not elsewhere classified, initial encounter: Secondary | ICD-10-CM

## 2022-06-29 NOTE — Progress Notes (Cosign Needed Addendum)
Referring Provider Quitman Livings, MD [address] [place],  [place] [address]   CC: No chief complaint on file.     Tammy [name] is an 67 y.o. female.  HPI: Patient is a 67 year old female with history of a wound to her left knee.  Patient most recently underwent preparation of left knee wound for placement of myriad and wound VAC placement with Dr. Ulice Bold on 06/15/2022.  Patient is about 2 weeks postop.  She presents to the clinic today for postoperative follow-up and wound VAC change.  Patient was last seen in the clinic on 06/22/2022.  At this visit, patient reported she was doing well.  On exam, wound to the left lower extremity was approximately 12 cm x 6.5 cm.  There was some healthy appearing granulation tissue to the superior medial aspect of the wound.  Wound was otherwise covered in unincorporated myriad.  There were no signs of infection on exam.  Wound VAC was changed.  Plan is for patient to return in 1 week.  Today, patient is accompanied by her fianc.  Patient reports she is doing well.  She states that she has some pain to her left knee at times.  She states that it is a sharp and burning intermittent pain.  She denies any fevers or chills.  She reports that home health changed her wound VAC later in the week last week and that change went well.  Review of Systems General: Denies any fevers or chills  Physical Exam    06/22/2022    2:48 PM 06/18/2022    2:07 PM 06/18/2022    5:14 AM  Vitals with BMI  Systolic 160 148 604  Diastolic 82 63 66  Pulse 59 64 54    General:  No acute distress,  Alert and oriented, Non-Toxic, Normal speech and affect On exam, patient is sitting upright in no acute distress.  Dressings to the left lower extremity were removed.  Wound measures approximately 12 cm x 6.5 cm.  There appears to be good granulation tissue to the superior medial aspect of the wound.  There appears to be some unincorporated myriad to the remainder of  the wound.  There is no surrounding erythema.  There are no signs of infection on exam.  Staples and sutures to the remainder of the incision appear to be intact.  Adaptic, K-Y jelly and wound VAC were placed on the wound.  Adequate suction was achieved at 125 mmHg.  Patient tolerated well.  Assessment/Plan  Surgical wound dehiscence, initial encounter   I discussed with the patient that we we will plan to continue with wound VAC changes 2 times a week.  Patient states that she is comfortable with home health changing this 2 times a week.  Updated home health instructions were faxed to the home health company.  I encouraged the patient to continue taking Tylenol and ibuprofen for the pain.  We did discuss the possibility of further pain medications, but patient later called the clinic and stated she received pain medications from another provider.  I encouraged the patient to try to avoid taking pain medications if possible.  Patient expressed understanding.  We will plan to see the patient back in 2 weeks for reevaluation.  I instructed the patient to call the meantime she has any questions or concerns about anything.  Pictures were obtained of the patient and placed in the chart with the patient's or guardian's permission.     Darliss Cheney  Alexiz Cothran 06/29/2022, 2:40 PM

## 2022-06-29 NOTE — Telephone Encounter (Signed)
Pt called in stating she did not need the pain medication, it was called in by another provider. Thanks.

## 2022-07-02 ENCOUNTER — Telehealth: Payer: Self-pay | Admitting: *Deleted

## 2022-07-02 NOTE — Telephone Encounter (Signed)
Called [facility] and spoke with the nurse [place] regarding wound care instructions for the patient.    Wound care instructions was given:Remove wound vac.  You may remove adaptic every other wound vac change.  Apply new adaptic if needed.  Apply [place] jelly over adaptic every dressing change.  Apply black wound vac sponge and wound vac drapes.  Start wound vac at mg.  Change wound vac 2x/week.  Wrap leg with kerlix and ace wrap after every change.  Asked [place] the nurse if she would like for me to fax the wound care order to her.  She stated yes.    Recent wound care orders faxed to [place] with [facility].  Confirmation received and copy scanned into the chart.//AB/CMA

## 2022-07-09 ENCOUNTER — Ambulatory Visit (INDEPENDENT_AMBULATORY_CARE_PROVIDER_SITE_OTHER): Payer: Medicare Other | Admitting: Internal Medicine

## 2022-07-09 ENCOUNTER — Telehealth: Payer: Self-pay

## 2022-07-09 ENCOUNTER — Other Ambulatory Visit: Payer: Self-pay

## 2022-07-09 VITALS — BP 148/78 | HR 79 | Temp 98.9°F | Wt 168.0 lb

## 2022-07-09 DIAGNOSIS — M869 Osteomyelitis, unspecified: Secondary | ICD-10-CM

## 2022-07-09 DIAGNOSIS — M8618 Other acute osteomyelitis, other site: Secondary | ICD-10-CM

## 2022-07-09 DIAGNOSIS — T847XXD Infection and inflammatory reaction due to other internal orthopedic prosthetic devices, implants and grafts, subsequent encounter: Secondary | ICD-10-CM

## 2022-07-09 MED ORDER — AMOXICILLIN 500 MG PO CAPS: 1000.0000 mg | ORAL_CAPSULE | Freq: Three times a day (TID) | ORAL | 11 refills | Status: DC

## 2022-07-09 NOTE — Patient Instructions (Signed)
Picc is to be removed by home health on last day of IV antibiotics 07/19/22  On 6/10 start amoxicillin 1000 mg three times a day   See me in 6 weeks

## 2022-07-09 NOTE — Telephone Encounter (Signed)
Per Dr. Renold Don called [facility] with orders to pull picc line after last dose on 6/9. Spoke with Cassie, Pharmacist who was able to take verbal order.  Juanita Laster, RMA

## 2022-07-09 NOTE — Progress Notes (Signed)
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 04/30/2022   Hypokalemia 04/16/2021   Hip fracture requiring operative repair (HCC) 04/16/2021   Vitamin D deficiency 04/12/2021   Hyponatremia 04/11/2021   Acute postoperative anemia due to expected blood loss 04/11/2021   Class 1 obesity 04/11/2021   Left comminuted and displaced fracture of the distal femur metaphysis 04/08/2021   History of CVA (cerebrovascular accident) 04/08/2021   Genetic testing 07/17/2020   Family history of breast cancer 07/10/2020   Family history of ovarian cancer 07/10/2020   Family history of colon cancer 07/10/2020   Malignant neoplasm of upper-outer quadrant of left breast in female, estrogen receptor positive (HCC) 07/09/2020   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   Chronic pain syndrome 10/06/2012   Nicotine dependence 10/06/2012   Anxiety state, unspecified 10/05/2012   Bipolar disorder, unspecified (HCC) 10/05/2012   Slurred speech 10/04/2012   Left-sided weakness 10/04/2012   Hyperkalemia 10/04/2012   Acute CVA (cerebrovascular accident) (HCC) 10/04/2012   Hypertension       Subjective:    Patient ID: Tammy [name], female    DOB: 06-01-1955, 67 y.o.   MRN: [id number]  Chief Complaint  Patient presents with   Follow-up    HPI:  Tammy [name] is a 67 y.o. female here for f/u left thigh om   She was discharged 2-3 weeks ago; s/p I&D in hospital and cx e faecalis Discharged on ampicillin  Diarrhea on unasyn but not amp 6 weeks iv planned till 07/19/22 then indefinite suppression   Still have  wound vac and was changed twice last 2-3 weeks by her surgeon's office Minimal pain if at all; no purulence out of vac. Granulating tissue in wound    Allergies  Allergen Reactions   Lisinopril Cough      Outpatient Medications Prior to Visit  Medication Sig Dispense Refill   acetaminophen (TYLENOL) 500 MG tablet Take 2 tablets (1,000 mg total) by mouth every 8 (eight) hours as needed for moderate pain, mild pain, fever or headache. 30 tablet 0   amLODipine (NORVASC) 10 MG tablet Take 1 tablet (10 mg total) by mouth daily. 30 tablet 0   ampicillin IVPB 2g IV every 4 hours Indication:  E faecalis  LLE SSI w/ hardware First Dose: Yes Last Day of Therapy:  07/19/22 Labs - Once weekly:  CBC/D and BMP, ESR and CRP Method of administration: Ambulatory Pump (Continuous Infusion) Pull PICC line at the completion of IV antibiotic therapy Method of administration may be changed at the discretion of home infusion pharmacist based upon assessment of the patient and/or caregiver's ability to self-administer the medication ordered. 37 Units 0   anastrozole (ARIMIDEX) 1 MG tablet TAKE 1 TABLET BY MOUTH DAILY 90 tablet 3   Ascorbic Acid (VITAMIN C) 1000 MG tablet Take 1,000 mg by mouth daily.     aspirin (ASPIRIN CHILDRENS) 81 MG chewable tablet Chew 1 tablet (81 mg total) by mouth 2 (two) times daily with a meal. 90 tablet 0   buPROPion (WELLBUTRIN SR) 150 MG 12 hr tablet Take 150 mg by mouth 2 (two) times daily.     CALCIUM PO  Take 500 mg by mouth daily.     cyanocobalamin (VITAMIN B12) 1000 MCG tablet Take 1,000 mcg by mouth daily.     docusate sodium (COLACE) 100 MG capsule Take 1 capsule (100 mg total) by mouth 2 (two) times daily. 60 capsule 0   gabapentin (NEURONTIN) 100 MG capsule Take 1 capsule (100 mg total) by mouth 2 (two) times daily. (Patient taking differently: Take 100-300 mg by mouth See admin instructions. Take 100 mg in the morning and 300 mg at bedtime) 30 capsule 0   hydrALAZINE  (APRESOLINE) 25 MG tablet Take 1 tablet (25 mg total) by mouth 2 (two) times daily. 60 tablet 0   meloxicam (MOBIC) 15 MG tablet Take 1 tablet (15 mg total) by mouth daily. 30 tablet 2   Menthol, Topical Analgesic, (BENGAY EX) Apply 1 Application topically daily as needed (pain).     Multiple Vitamin (MULTIVITAMIN) TABS Take 1 tablet by mouth daily. 30 tablet    ondansetron (ZOFRAN) 4 MG tablet Take 1 tablet (4 mg total) by mouth every 8 (eight) hours as needed for nausea or vomiting. 30 tablet 0   oxyCODONE (ROXICODONE) 5 MG immediate release tablet Take 1-2 tablets (5-10 mg total) by mouth every 6 (six) hours as needed for severe pain or moderate pain. 42 tablet 0   pantoprazole (PROTONIX) 40 MG tablet Take 1 tablet (40 mg total) by mouth daily. 30 tablet 0   polyethylene glycol (MIRALAX) 17 g packet Take 17 g by mouth daily as needed for mild constipation or moderate constipation. 14 each 0   potassium chloride (KLOR-CON) 10 MEQ tablet Take 10 mEq by mouth daily.     REGLAN 10 MG tablet Take 10 mg by mouth daily as needed for nausea.     zinc gluconate 50 MG tablet Take 50 mg by mouth daily.     No facility-administered medications prior to visit.     Social History   Socioeconomic History   Marital status: Significant Other    Spouse name: Not on file   Number of children: 2   Years of education: Not on file   Highest education level: Not on file  Occupational History   Occupation: diabled  Tobacco Use   Smoking status: Former    Packs/day: 1.50    Years: 40.00    Additional pack years: 0.00    Total pack years: 60.00    Types: Cigarettes   Smokeless tobacco: Never   Tobacco comments:    has used e-cigarettes, no vaping   Vaping Use   Vaping Use: Never used  Substance and Sexual Activity   Alcohol use: Not Currently   Drug use: No   Sexual activity: Not Currently  Other Topics Concern   Not on file  Social History Narrative   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   Disabled nurse    Right handed   Caffeine: 1 cup in the morning    Social Determinants of Health   Financial Resource Strain: Not on file  Food Insecurity: No Food Insecurity (06/04/2022)   Hunger Vital Sign    Worried About Running Out of Food in the Last Year: Never true    Ran Out of Food in the Last Year: Never true  Transportation Needs: No Transportation Needs (06/04/2022)   PRAPARE - Administrator, Civil Service (Medical): No    Lack of Transportation (Non-Medical): No  Physical Activity: Not on file  Stress: Not on file  Social Connections: Not on  file  Intimate Partner Violence: Not At Risk (06/04/2022)   Humiliation, Afraid, Rape, and Kick questionnaire    Fear of Current or Ex-Partner: No    Emotionally Abused: No    Physically Abused: No    Sexually Abused: No      Review of Systems     Objective:    BP (!) 164/75   Pulse 79   Temp 98.9 F (37.2 C) (Oral)   Wt 168 lb (76.2 kg)   SpO2 96%   BMI 30.73 kg/m  Nursing note and vital signs reviewed.  Physical Exam     General/constitutional: no distress, pleasant HEENT: Normocephalic, PER, Conj Clear, EOMI, Oropharynx clear Neck supple CV: rrr no mrg Lungs: clear to auscultation, normal respiratory effort Abd: Soft, Nontender Ext: no edema Skin: No Rash Neuro: nonfocal MSK: no peripheral joint swelling/tenderness/warmth; [name] spines nontender     06/29/22 picture     Labs:  Micro:  Serology:  Imaging:  Assessment & Plan:   Problem List Items Addressed This Visit   None     No orders of the defined types were placed in this encounter.    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 Likely will need long term antibiotics suppression   ------ 5/2 assessment Needing more I&D with plastic surgery As above 4/25 and 4/28 operative cx e faecalis   Diarrhea with amp-sulb   Will change abx to ampicillin alone. She'll need long term suppressive amoxicillin after 6 weeks of ampicillin, and can start clock on 4/28       ---------------- 07/09/22 id assessment Patient remains on iv ampicillin via picc without adverse effect She has been seen at surgery clinic recently for wound vac change since discharged from hospital on 06/18/22 She had no diarrhea or n/v/rash  Her last opat labs 5/28 reviewed Crp 2 (<10); sed rate 65  Lft 11/15/88/0.2 Cbc7/10/300; cr 0.63; alb 4     Follow-up: Return in about 6 weeks (around 08/20/2022).      Raymondo Band, MD [facility] 07/09/2022, 2:51 PM

## 2022-07-13 ENCOUNTER — Encounter: Payer: Self-pay | Admitting: Student

## 2022-07-13 ENCOUNTER — Ambulatory Visit (INDEPENDENT_AMBULATORY_CARE_PROVIDER_SITE_OTHER): Payer: Medicare Other | Admitting: Student

## 2022-07-13 VITALS — BP 131/81 | HR 72

## 2022-07-13 DIAGNOSIS — T8131XA Disruption of external operation (surgical) wound, not elsewhere classified, initial encounter: Secondary | ICD-10-CM | POA: Diagnosis not present

## 2022-07-13 NOTE — Progress Notes (Signed)
Referring Provider Quitman Livings, MD [address] [place],  [place] [address]   CC:  Chief Complaint  Patient presents with   Post-op Follow-up      Tammy [name] is an 67 y.o. female.  HPI: Patient is a 67 year old female with history of a wound to her left knee.  Patient most recently underwent preparation of the left knee wound for placement of myriad and wound VAC placement with Dr. Ulice Bold on 06/15/2022.  Patient is 4 weeks postop.  She presents to the clinic today with concerns about her drainage and some pain.  Patient was last seen in the clinic on 06/29/2022.  At this visit, patient was doing well.  Her wound measured approximately 12 cm x 6.5 cm.  There was good granulation tissue noted to the superior medial aspect of the wound.  There was some unincorporated myriad noted to the remainder of the wound.  Wound VAC was changed and patient was recommended to follow back up in 2 weeks.  Home health to change the wound VAC twice weekly.  Today, patient presents with her fianc.  Patient states that recently, she has been experiencing some pain to the medial aspect of her left knee.  She states that she feels this pain has been worsening.  She states that she has had infection in the past and was concerned.  Patient reports she felt her drain output was a little bit more bloody than usual.  She denies any fevers or chills.  She denies any shortness of breath.  She denies any other concerns or complaints.  Patient states she has an appointment with orthopedics next week.  Review of Systems General: Denies any fevers or chills  Physical Exam    07/13/2022    3:42 PM 07/09/2022    3:18 PM 07/09/2022    2:36 PM  Vitals with BMI  Weight   168 lbs  BMI   30.72  Systolic 131 148 621  Diastolic 81 78 75  Pulse 72  79    General:  No acute distress,  Alert and oriented, Non-Toxic, Normal speech and affect On exam, patient is sitting upright in no acute distress.  There  is some mild tenderness to palpation to the medial aspect of the left knee.  There is no overlying skin changes.  There is some swelling noted to the left lower extremity.  There is no tenderness to palpation of the calf.  There is no calf redness.  The wound to her left knee is approximately 11 cm x 6 cm.  There appears to be more granulation tissue noted throughout the wound bed.  There is some fibrinous exudate, noted to be more centrally to the wound.  There is no surrounding erythema.  There is no drainage on exam.  There is a slight odor on exam.  There is a little bit of skin breakdown to the superior aspect of the wound near where some of the sutures appear to be.  There does not appear to be any signs of infection on exam.  Adaptic, K-Y jelly, black wound VAC sponge and drapes were placed in clinic today.  Wound VAC was set to 125 mmHg with adequate seal.  Assessment/Plan  Surgical wound dehiscence, initial encounter - Plan: VAS [place] LOWER EXTREMITY VENOUS (DVT), CANCELED: US Venous Img Lower Unilateral Left, CANCELED: US Venous Img Lower Unilateral Left   I discussed with the patient that given her worsening pain and swelling noted to her  lower extremity, I recommended we order an ultrasound to rule out DVT.  Patient was in agreement with this plan.  An ultrasound was scheduled for tomorrow morning at [facility].  I discussed with the patient if she has any worsening symptoms tonight or experiences any shortness of breath or chest pain she needs to go to the emergency room.  Patient expressed understanding.  Patient to continue with dressing changes as prescribed before.  I recommend the patient soak her wash the wound with Vashe to help with the odor.  Patient expressed understanding.  I recommended that patient apply Vaseline to the little area of skin breakdown just superior of the wound.  I discussed with her that if it becomes more irritated or bothersome, she may place a small piece of  Xeroform in the area.  Patient expressed understanding.  Patient to follow-up later next week.  I discussed with her I will call her with the results of the ultrasound once they are back.  Patient expressed understanding.  I instructed her to call in the meantime if she has any questions or concerns about anything.  Pictures were obtained of the patient and placed in the chart with the patient's or guardian's permission.   [name] 07/13/2022, 4:45 PM

## 2022-07-14 ENCOUNTER — Telehealth: Payer: Self-pay | Admitting: Student

## 2022-07-14 ENCOUNTER — Ambulatory Visit (HOSPITAL_COMMUNITY)
Admission: RE | Admit: 2022-07-14 | Discharge: 2022-07-14 | Disposition: A | Discharge status: Home / Self Care | Payer: Medicare Other | Source: Ambulatory Visit | Attending: Plastic Surgery | Admitting: Plastic Surgery

## 2022-07-14 DIAGNOSIS — T8131XA Disruption of external operation (surgical) wound, not elsewhere classified, initial encounter: Secondary | ICD-10-CM | POA: Diagnosis present

## 2022-07-14 NOTE — Progress Notes (Signed)
Left lower extremity venous study completed.   Attempted to call preliminary results to Caroline More, PA, but no answer.  Please see CV Procedures for preliminary results.  Tiffney Haughton, RVT  10:19 AM 07/14/22

## 2022-07-14 NOTE — Telephone Encounter (Signed)
I spoke with the patient regarding her ultrasound results.  There is no evidence of DVT per ultrasound results.  I discussed with the patient that she may wear compression socks to help with her swelling if she would like.  I recommended she elevate her leg.  I also recommended she take Tylenol and ibuprofen for pain.  Patient expressed understanding.  She states she is already feeling that her leg swelling has gone down since yesterday.  I instructed the patient to call if she has any questions or concerns about anything.

## 2022-07-15 ENCOUNTER — Encounter: Payer: Medicare Other | Admitting: Student

## 2022-07-16 ENCOUNTER — Encounter: Payer: Medicare Other | Admitting: Student

## 2022-07-22 NOTE — Progress Notes (Deleted)
Patient is a pleasant 67 year old female with PMH of left knee wound s/p myriad and wound VAC placement performed by Dr. Ulice Bold 06/15/2022 who returns to clinic for postoperative follow-up and wound VAC change.  Reviewed chart and she was last seen here in clinic for wound VAC change on 07/13/2022.  At that time, she was having some pain to the medial aspect of her left knee and felt as though it was worsening.  She states that she had appointment with orthopedics the following week.  On exam, wound noted to be 11 x 6 cm with improved granulation.  Vashe was recommended for the odor.  Vaseline recommended for skin breakdown around the wound.  DVT study was ordered to assess for possible venous occlusion.  Negative for any DVT.  Today,

## 2022-07-23 ENCOUNTER — Encounter: Payer: Medicare Other | Admitting: Physician Assistant

## 2022-07-29 ENCOUNTER — Ambulatory Visit (INDEPENDENT_AMBULATORY_CARE_PROVIDER_SITE_OTHER): Payer: Medicare Other | Admitting: Student

## 2022-07-29 VITALS — BP 133/81 | HR 88

## 2022-07-29 DIAGNOSIS — T8131XA Disruption of external operation (surgical) wound, not elsewhere classified, initial encounter: Secondary | ICD-10-CM | POA: Diagnosis not present

## 2022-07-29 NOTE — Progress Notes (Addendum)
Referring Provider Quitman Livings, MD [address] [place],  [place] [address]   CC:  Chief Complaint  Patient presents with   Post-op Follow-up      Tammy [name] is an 67 y.o. female.  HPI: Patient is a 67 year old female with history of a wound to her left knee.  Patient underwent preparation of left knee wound for placement of myriad and wound VAC with Dr. Ulice Bold on 06/15/2022.  She is a little over 6 weeks postop.  Patient presents to the clinic today for postoperative follow-up.  Patient was last seen in the clinic on 07/13/2022.  At this visit, patient reported she was having little bit of pain to the medial and aspect of her knee.  On exam, there was some mild tenderness to palpation to the medial aspect of the knee.  There is some swelling noted to the left lower extremity.  The left knee wound is approximately 11 cm x 6 cm.  There appears to be more granulation tissue noted throughout the wound bed.  There was also some fibrinous exudate noted, more centrally to the wound.  There were not any signs of infection.  Adaptic, K-Y jelly, black wound VAC sponge and drapes were placed.  Ultrasound of the left lower extremity was ordered to rule out DVT.  Results of the DVT showed no evidence of DVT.  Today, patient reports she is doing well.  Her fianc is present at bedside.  Patient states that her pain has been tolerable.  She states she saw orthopedics a few weeks ago and that they removed the staples and some of the sutures.  She states that they recommended continued use of the brace.  She denies any fevers or chills.  She denies any other issues or concerns.  Review of Systems General: Denies fevers or chills  Physical Exam    07/13/2022    3:42 PM 07/09/2022    3:18 PM 07/09/2022    2:36 PM  Vitals with BMI  Weight   168 lbs  BMI   30.72  Systolic 131 148 604  Diastolic 81 78 75  Pulse 72  79    General:  No acute distress,  Alert and oriented, Non-Toxic,  Normal speech and affect On exam, patient is sitting upright in no acute distress.  Dressings were removed from left lower extremity.  Wound is approximately 10.5 cm x 5.5 cm x 0.5 cm.  Most of the wound has good granulation tissue noted throughout.  There is some fibrinous exudate noted to the central aspect of the wound.  There is a little bit of a deeper part of the wound noted as well.  There is no active drainage on exam.  There is no surrounding erythema.  There are no signs of infection on exam.  There is a superficial wound noted to the superior aspect of the knee wound.  Donated myriad powder was placed on the deepest part of the wound, covered with Adaptic, K-Y jelly and wound VAC sponge.  Wound VAC sponge was set to 125 mmHg.   Assessment/Plan  Surgical wound dehiscence, initial encounter   I discussed with the patient that overall, wound appears to be filling in with good granulation tissue.  I discussed with her that it appears some of her wound is still a little too deep to take her off the wound VAC at this time.  We will plan to see her back in 2 weeks for reevaluation and consider  possible wound VAC holiday.  Patient was in agreement with this plan.  Patient to continue with home health care wound VAC changes 2 times per week.  I discussed with the patient she may put a little Vaseline on the wound to the superior aspect of the knee.  Patient expressed understanding.  Patient to follow back up in 2 weeks.  I instructed her to call in the meantime if she has any questions or concerns about anything.  Pictures were obtained of the patient and placed in the chart with the patient's or guardian's permission.    [name] 07/29/2022, 1:45 PM

## 2022-08-14 ENCOUNTER — Telehealth: Payer: Self-pay

## 2022-08-14 NOTE — Telephone Encounter (Signed)
Karin Golden, RN from [facility] called to request verbal orders dressing changes/wound vac changes 2x/week for 8 weeks.   Call back # [phone].

## 2022-08-17 ENCOUNTER — Ambulatory Visit (INDEPENDENT_AMBULATORY_CARE_PROVIDER_SITE_OTHER): Payer: Medicare Other | Admitting: Student

## 2022-08-17 ENCOUNTER — Encounter: Payer: Self-pay | Admitting: Student

## 2022-08-17 VITALS — BP 130/75 | HR 76

## 2022-08-17 DIAGNOSIS — T8131XA Disruption of external operation (surgical) wound, not elsewhere classified, initial encounter: Secondary | ICD-10-CM

## 2022-08-17 MED ORDER — PROMETHAZINE HCL 12.5 MG PO TABS: 12.5000 mg | ORAL_TABLET | Freq: Three times a day (TID) | ORAL | 0 refills | Status: DC | PRN

## 2022-08-17 NOTE — Progress Notes (Signed)
Referring Provider Quitman Livings, MD [address] [place],  [place] [address]   CC:  Chief Complaint  Patient presents with   Post-op Follow-up      Tammy [name] is an 67 y.o. female.  HPI: Patient is a 66 year old female with history of a wound to her left knee. Patient underwent preparation of left knee wound for placement of myriad and wound VAC with Dr. Ulice Bold on 06/15/2022. Patient presents to the clinic today for postoperative follow-up.   Patient was last seen in the clinic on 07/29/2022.  At this visit, patient reported she was doing well.  On exam, wound was approximately 10.5 cm x 5.5 cm.  Most of the wound was noted to have good granulation tissue throughout.  There was some fibrinous exudate noted to the central aspect of the wound.  Wound was noted to be a little bit deeper in this area as well.  There is no surrounding erythema.  There are no signs of infection on exam.  Donated myriad was placed in the deepest part of the wound.  Plan was for patient to continue wound VAC changes and return in 2 weeks for reevaluation.  Today, patient reports she is doing okay.  She states that she has had some increased drainage from her wound.  She states that her home health nurse on Friday noticed some fluid coming from a deeper pocket of the wound.  She reports she believes there is an odor as well.  Patient states that she has been taking oral antibiotics also as prescribed from infectious disease, but states these are making her nauseous.  She states she is taking Zofran which she feels is not helping a whole lot.  She denies any fevers.  She denies any other issues or concerns at this time.  Review of Systems General: Denies fevers or chills  Physical Exam    08/17/2022   10:57 AM 07/29/2022    1:44 PM 07/13/2022    3:42 PM  Vitals with BMI  Systolic 130 133 604  Diastolic 75 81 81  Pulse 76 88 72    General:  No acute distress,  Alert and oriented, Non-Toxic,  Normal speech and affect On exam, patient is sitting upright in no acute distress.  Wound measures to 10 cm x 5 cm.  There is an approximately 1.5 cm x 1.5 cm area central/superior within the wound that is deeper and appears to have some fibrinous tissue within it.  There is no fluid noted in the wound.  There is a slight odor.  No purulence or drainage was noted upon expression.  The rest of the wound is noted to have healthy granulation tissue throughout.  Wound VAC was replaced at today's visit.  Adequate suction was noted on 125 mmHg.  Assessment/Plan  Surgical wound dehiscence, initial encounter   Discussed with the patient that we will have her keep the wound VAC on given the deeper aspect of her wound.  I recommended home health continue Vashe washes and the wound VAC changes 2 times per week.  I also discussed with the patient that given she is having this difficulty, I will discuss possible further interventions with Dr. Ulice Bold.  I discussed with the patient there is a possibility she may have to go back to the operating room.  Patient expressed understanding.   Patient reports that her Zofran is not helping with her current nausea. Patient states that she has an appointment with her infectious  disease provider next week.  She states that she is going to move this appointment up to this week to discuss this issue.  Patient reports she has taken Phenergan in the past which has helped with her nausea before.  I discussed with her that I can send this in for her until she sees her infectious disease provider.    I discussed with the patient to monitor her wound and her symptoms.  I discussed with her that if she notices any worsening drainage, pain, fevers, chills or has any questions or concerns to let us know.  Patient to follow-up early next week.  I instructed her to call in the meantime she has any questions or concerns about anything.  Pictures were obtained of the patient and  placed in the chart with the patient's or guardian's permission.  [name] 08/17/2022, 11:35 AM

## 2022-08-17 NOTE — Telephone Encounter (Signed)
L/M for Tammy [name] to call back for verbal orders.

## 2022-08-21 ENCOUNTER — Encounter: Payer: Medicare Other | Admitting: Physician Assistant

## 2022-08-24 ENCOUNTER — Other Ambulatory Visit: Payer: Self-pay | Admitting: Student

## 2022-08-25 ENCOUNTER — Ambulatory Visit: Payer: Medicare Other | Admitting: Internal Medicine

## 2022-08-25 ENCOUNTER — Other Ambulatory Visit: Payer: Self-pay

## 2022-08-25 ENCOUNTER — Ambulatory Visit: Payer: Medicare Other | Admitting: Physician Assistant

## 2022-08-25 ENCOUNTER — Encounter: Payer: Self-pay | Admitting: Internal Medicine

## 2022-08-25 VITALS — BP 138/76 | HR 74 | Temp 98.0°F

## 2022-08-25 VITALS — BP 154/84 | HR 80

## 2022-08-25 DIAGNOSIS — M8660 Other chronic osteomyelitis, unspecified site: Secondary | ICD-10-CM

## 2022-08-25 DIAGNOSIS — S81002D Unspecified open wound, left knee, subsequent encounter: Secondary | ICD-10-CM

## 2022-08-25 DIAGNOSIS — T847XXD Infection and inflammatory reaction due to other internal orthopedic prosthetic devices, implants and grafts, subsequent encounter: Secondary | ICD-10-CM | POA: Diagnosis not present

## 2022-08-25 LAB — CBC: WBC: 6.2 10*3/uL (ref 3.8–10.8)

## 2022-08-25 MED ORDER — PROMETHAZINE HCL 12.5 MG PO TABS: 12.5000 mg | ORAL_TABLET | Freq: Three times a day (TID) | ORAL | 3 refills | Status: DC | PRN

## 2022-08-25 NOTE — Patient Instructions (Signed)
Labs today  Will see if your surgeon needs to do any deep I&D. If you end up in hospital please make sure you are seen by our ID team there before discharge  See me in 3-4 weeks otherwise in clinic   I might have to put you back on IV abx if surgery is done

## 2022-08-25 NOTE — Progress Notes (Signed)
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 04/30/2022   Hypokalemia 04/16/2021   Hip fracture requiring operative repair (HCC) 04/16/2021   Vitamin D deficiency 04/12/2021   Hyponatremia 04/11/2021   Acute postoperative anemia due to expected blood loss 04/11/2021   Class 1 obesity 04/11/2021   Left comminuted and displaced fracture of the distal femur metaphysis 04/08/2021   History of CVA (cerebrovascular accident) 04/08/2021   Genetic testing 07/17/2020   Family history of breast cancer 07/10/2020   Family history of ovarian cancer 07/10/2020   Family history of colon cancer 07/10/2020   Malignant neoplasm of upper-outer quadrant of left breast in female, estrogen receptor positive (HCC) 07/09/2020   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   Chronic pain syndrome 10/06/2012   Nicotine dependence 10/06/2012   Anxiety state, unspecified 10/05/2012   Bipolar disorder, unspecified (HCC) 10/05/2012   Slurred speech 10/04/2012   Left-sided weakness 10/04/2012   Hyperkalemia 10/04/2012   Acute CVA (cerebrovascular accident) (HCC) 10/04/2012   Hypertension       Subjective:    Patient ID: Tammy [name], female    DOB: 1955/06/21, 67 y.o.   MRN: [id number]  No chief complaint on file.   HPI:  Tammy [name] is a 67 y.o. 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 Likely will need long term antibiotics suppression She was discharged 2-3 weeks ago; s/p I&D in hospital and cx e faecalis Discharged on ampicillin  Diarrhea on unasyn but not amp 6 weeks iv planned till 07/19/22 then indefinite suppression   Still have wound vac and was changed twice last 2-3 weeks by her surgeon's office Minimal pain if at all; no purulence out of vac. Granulating tissue in wound   ---------- 08/25/22 id clinic f/u She should have finished iv abx on 07/19/22 and then go on amoxicillin for indefinite suppression in setting hardware in the infected site (partial hardware removal and new hardware placement at times of I&D) She takes amoxicillin as rx'ed 3 times a day 1 gram Some nausea with meds No diarrhea  Some purulence and wound break down and pain No fever chill  See surgery today to see if more surgery needed  Asking for phenergan refill  Bp high       Allergies  Allergen Reactions   Lisinopril Cough      Outpatient Medications Prior to Visit  Medication Sig Dispense Refill   acetaminophen (TYLENOL) 500 MG tablet Take 2 tablets (1,000 mg total) by mouth every 8 (eight) hours as needed for moderate pain, mild pain, fever or headache. 30 tablet 0   amLODipine (NORVASC) 10 MG tablet Take 1  tablet (10 mg total) by mouth daily. 30 tablet 0   amoxicillin (AMOXIL) 500 MG capsule Take 2 capsules (1,000 mg total) by mouth 3 (three) times daily. 180 capsule 11   anastrozole (ARIMIDEX) 1 MG tablet TAKE 1 TABLET BY MOUTH DAILY 90 tablet 3   Ascorbic Acid (VITAMIN C) 1000 MG tablet Take 1,000 mg by mouth daily.     buPROPion  (WELLBUTRIN SR) 150 MG 12 hr tablet Take 150 mg by mouth 2 (two) times daily.     CALCIUM PO Take 500 mg by mouth daily.     cyanocobalamin (VITAMIN B12) 1000 MCG tablet Take 1,000 mcg by mouth daily.     gabapentin (NEURONTIN) 100 MG capsule Take 1 capsule (100 mg total) by mouth 2 (two) times daily. (Patient taking differently: Take 100-300 mg by mouth See admin instructions. Take 100 mg in the morning and 300 mg at bedtime) 30 capsule 0   hydrALAZINE (APRESOLINE) 25 MG tablet Take 1 tablet (25 mg total) by mouth 2 (two) times daily. 60 tablet 0   meloxicam (MOBIC) 15 MG tablet Take 1 tablet (15 mg total) by mouth daily. 30 tablet 2   Menthol, Topical Analgesic, (BENGAY EX) Apply 1 Application topically daily as needed (pain).     Multiple Vitamin (MULTIVITAMIN) TABS Take 1 tablet by mouth daily. 30 tablet    ondansetron (ZOFRAN) 4 MG tablet Take 1 tablet (4 mg total) by mouth every 8 (eight) hours as needed for nausea or vomiting. 30 tablet 0   oxyCODONE (ROXICODONE) 5 MG immediate release tablet Take 1-2 tablets (5-10 mg total) by mouth every 6 (six) hours as needed for severe pain or moderate pain. 42 tablet 0   pantoprazole (PROTONIX) 40 MG tablet Take 1 tablet (40 mg total) by mouth daily. 30 tablet 0   potassium chloride (KLOR-CON) 10 MEQ tablet Take 10 mEq by mouth daily.     promethazine (PHENERGAN) 12.5 MG tablet Take 1 tablet (12.5 mg total) by mouth every 8 (eight) hours as needed for up to 10 doses for nausea or vomiting. 10 tablet 0   REGLAN 10 MG tablet Take 10 mg by mouth daily as needed for nausea.     zinc gluconate 50 MG tablet Take 50 mg by mouth daily.     No facility-administered medications prior to visit.     Social History   Socioeconomic History   Marital status: Significant Other    Spouse name: Not on file   Number of children: 2   Years of education: Not on file   Highest education level: Not on file  Occupational History   Occupation: diabled  Tobacco Use    Smoking status: Former    Current packs/day: 1.50    Average packs/day: 1.5 packs/day for 40.0 years (60.0 ttl pk-yrs)    Types: Cigarettes   Smokeless tobacco: Never   Tobacco comments:    has used e-cigarettes, no vaping   Vaping Use   Vaping status: Never Used  Substance and Sexual Activity   Alcohol use: Not Currently   Drug use: No   Sexual activity: Not Currently  Other Topics Concern   Not on file  Social History Narrative   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   Caffeine: 1 cup in the morning    Social Determinants of Health   Financial Resource Strain: Not on file  Food Insecurity: No Food Insecurity (06/04/2022)   Hunger Vital Sign  Worried About Programme researcher, broadcasting/film/video in the Last Year: Never true    Ran Out of Food in the Last Year: Never true  Transportation Needs: No Transportation Needs (06/04/2022)   PRAPARE - Administrator, Civil Service (Medical): No    Lack of Transportation (Non-Medical): No  Physical Activity: Not on file  Stress: Not on file  Social Connections: Not on file  Intimate Partner Violence: Not At Risk (06/04/2022)   Humiliation, Afraid, Rape, and Kick questionnaire    Fear of Current or Ex-Partner: No    Emotionally Abused: No    Physically Abused: No    Sexually Abused: No      Review of Systems     Objective:    There were no vitals taken for this visit. Nursing note and vital signs reviewed.  Physical Exam     General/constitutional: no distress, pleasant HEENT: Normocephalic, PER, Conj Clear, EOMI, Oropharynx clear Neck supple CV: rrr no mrg Lungs: clear to auscultation, normal respiratory effort Abd: Soft, Nontender Ext: no edema Skin: No Rash Neuro: nonfocal Msk: upper incision healed no dehiscence; through the plastic part of the wound vac I see purulence though       06/29/22 picture     Labs: Lab Results  Component Value Date   WBC 7.2 06/16/2022   HGB 8.7 (L)  06/16/2022   HCT 27.9 (L) 06/16/2022   MCV 94.6 06/16/2022   PLT 331 06/16/2022   Last metabolic panel Lab Results  Component Value Date   GLUCOSE 136 (H) 06/16/2022   NA 136 06/16/2022   K 3.9 06/16/2022   CL 102 06/16/2022   CO2 24 06/16/2022   BUN 19 06/16/2022   CREATININE 0.72 06/16/2022   GFRNONAA >60 06/16/2022   CALCIUM 8.9 06/16/2022   PHOS 3.9 06/15/2022   PROT 7.0 06/16/2022   ALBUMIN 3.0 (L) 06/16/2022   LABGLOB 3.3 12/01/2018   BILITOT 0.2 (L) 06/16/2022   ALKPHOS 74 06/16/2022   AST 16 06/16/2022   ALT 14 06/16/2022   ANIONGAP 10 06/16/2022    Micro:  Serology:  Imaging:  Assessment & Plan:   Problem List Items Addressed This Visit   None Visit Diagnoses     Hardware complicating wound infection, subsequent encounter    -  Primary   Relevant Orders   C-reactive protein   CBC   COMPLETE METABOLIC PANEL WITH GFR   Chronic osteomyelitis (HCC)       Relevant Orders   C-reactive protein   CBC   COMPLETE METABOLIC PANEL WITH GFR         No orders of the defined types were placed in this encounter.    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 Likely will need long term antibiotics suppression   ------ 5/2 assessment Needing  more I&D with plastic surgery As above 4/25 and 4/28 operative cx e faecalis   Diarrhea with amp-sulb   Will change abx to ampicillin alone. She'll need long term suppressive amoxicillin after 6 weeks of ampicillin, and can start clock on 4/28       ---------------- 07/09/22 id assessment Patient remains on iv ampicillin via picc without adverse effect She has been seen at surgery clinic recently for wound vac change since discharged from hospital on 06/18/22 She had no diarrhea or n/v/rash  Her last opat labs 5/28 reviewed Crp 2 (<10); sed rate 65  Lft 11/15/88/0.2 Cbc7/10/300; cr 0.63; alb 4  08/25/22 id clinic assessment Concerning purulence/pain and still not yet all closed up wound Labs today to assess abx toxicity/inflammatory changes Phenergan refill If I&D needed will likely need to switch [name] to iv abx for a few weeks before going to amox again. And she should be seen by inpatient ID team Otherwise see me in 3-4 weeks Continue amox for now   Follow-up: Return in about 3 weeks (around 09/15/2022).      Raymondo Band, MD [facility] 08/25/2022, 10:06 AM

## 2022-08-25 NOTE — Progress Notes (Signed)
Referring Provider Tammy Livings, MD [address] [place],  [place] [address]   CC:  Chief Complaint  Patient presents with   Post-op Follow-up      Tammy [name] is an 67 y.o. female.  HPI: Patient is a pleasant 67 year old female with PMH of left knee wound s/p myriad and wound VAC placement performed by Dr. Ulice [name] 06/15/2022 who returns to clinic for postoperative follow-up and wound VAC change.  She was last seen here in clinic on 08/17/2022.  At that time, she endorsed increased drainage from her wound and reported that [facility] had noticed fluid coming from a deep pocket.  She also endorsed malodor, but had been taking oral antibiotics as prescribed from ID.  Wound measured 10 x 5 cm with an additional 1.5 x 1.5 cm area superiorly that was deeper with fibrinous tissue within.  Slight malodor, but no obvious depressible fluid.  Wound VAC was replaced, adequate suction achieved at 125 mmHg.  Discussed continued wound VAC in addition to home health Vashe washes at times of VAC change twice weekly.  Discussed possibility of going back to the operating room if her wound and symptoms fail to improve.  Today, patient is doing well.  She is accompanied by her husband Tammy [name] and her sister Tammy [name] at bedside.  She states that the wound may have gotten a little bit better since last encounter, but reports that there has been some yellowish-Tammy [name] drainage and she is endorsing some ongoing discomfort deep in the knee.  She denies any fevers, leg swelling, or other symptoms.  HHN has been performing VAC changes as scheduled.    Allergies  Allergen Reactions   Lisinopril Cough    Outpatient Encounter Medications as of 08/25/2022  Medication Sig   acetaminophen (TYLENOL) 500 MG tablet Take 2 tablets (1,000 mg total) by mouth every 8 (eight) hours as needed for moderate pain, mild pain, fever or headache.   amLODipine (NORVASC) 10 MG tablet Take 1 tablet (10 mg total) by mouth daily.    amoxicillin (AMOXIL) 500 MG capsule Take 2 capsules (1,000 mg total) by mouth 3 (three) times daily.   anastrozole (ARIMIDEX) 1 MG tablet TAKE 1 TABLET BY MOUTH DAILY   Ascorbic Acid (VITAMIN C) 1000 MG tablet Take 1,000 mg by mouth daily.   buPROPion (WELLBUTRIN SR) 150 MG 12 hr tablet Take 150 mg by mouth 2 (two) times daily.   CALCIUM PO Take 500 mg by mouth daily.   cyanocobalamin (VITAMIN B12) 1000 MCG tablet Take 1,000 mcg by mouth daily.   gabapentin (NEURONTIN) 100 MG capsule Take 1 capsule (100 mg total) by mouth 2 (two) times daily. (Patient taking differently: Take 100-300 mg by mouth See admin instructions. Take 100 mg in the morning and 300 mg at bedtime)   hydrALAZINE (APRESOLINE) 25 MG tablet Take 1 tablet (25 mg total) by mouth 2 (two) times daily.   meloxicam (MOBIC) 15 MG tablet Take 1 tablet (15 mg total) by mouth daily.   Menthol, Topical Analgesic, (BENGAY EX) Apply 1 Application topically daily as needed (pain).   Multiple Vitamin (MULTIVITAMIN) TABS Take 1 tablet by mouth daily.   ondansetron (ZOFRAN) 4 MG tablet Take 1 tablet (4 mg total) by mouth every 8 (eight) hours as needed for nausea or vomiting. (Patient not taking: Reported on 08/25/2022)   oxyCODONE (ROXICODONE) 5 MG immediate release tablet Take 1-2 tablets (5-10 mg total) by mouth every 6 (six) hours as needed for severe pain or  moderate pain.   pantoprazole (PROTONIX) 40 MG tablet Take 1 tablet (40 mg total) by mouth daily.   potassium chloride (KLOR-CON) 10 MEQ tablet Take 10 mEq by mouth daily.   promethazine (PHENERGAN) 12.5 MG tablet Take 1 tablet (12.5 mg total) by mouth every 8 (eight) hours as needed for nausea or vomiting.   REGLAN 10 MG tablet Take 10 mg by mouth daily as needed for nausea. (Patient not taking: Reported on 08/25/2022)   zinc gluconate 50 MG tablet Take 50 mg by mouth daily.   [DISCONTINUED] promethazine (PHENERGAN) 12.5 MG tablet Take 1 tablet (12.5 mg total) by mouth every 8 (eight) hours  as needed for up to 10 doses for nausea or vomiting.   No facility-administered encounter medications on file as of 08/25/2022.     Past Medical History:  Diagnosis Date   Anemia    hx of   Anxiety    on meds   Arthritis    back/bilateral ankles   Bipolar disorder (HCC)    Breast cancer (HCC) 2022   LEFT breast-radiation   Depression    on meds   Family history of breast cancer 07/10/2020   Family history of colon cancer 07/10/2020   Family history of ovarian cancer 07/10/2020   GERD (gastroesophageal reflux disease)    on meds   Heart murmur    dx by PCP   Hepatitis C    HTN (hypertension)    on meds   Hx of radiation therapy    Liver cirrhosis (HCC)    from hep c   Neuromuscular disorder (HCC)    Osteopenia    Stroke (HCC) 09/2012   slurred speech and LEFT sided weakness/notes 10/04/2012    Past Surgical History:  Procedure Laterality Date   ANKLE FRACTURE SURGERY Left 2018   APPLICATION OF WOUND VAC Left 06/15/2022   Procedure: APPLICATION OF WOUND VAC;  Surgeon: Tammy Form, DO;  Location: [facility];  Service: Plastics;  Laterality: Left;   BREAST LUMPECTOMY WITH RADIOACTIVE SEED AND SENTINEL LYMPH NODE BIOPSY Left 07/26/2020   Procedure: LEFT BREAST LUMPECTOMY WITH RADIOACTIVE SEED AND SENTINEL LYMPH NODE BIOPSY;  Surgeon: Tammy Miyamoto, MD;  Location: [facility];  Service: General;  Laterality: Left;   CESAREAN SECTION  1986   COLONOSCOPY     DIAGNOSTIC LAPAROSCOPY  1985   HARDWARE REMOVAL Left 04/30/2022   Procedure: HARDWARE REMOVAL LEFT FEMUR;  Surgeon: Tammy Frederic, MD;  Location: [facility];  Service: Orthopedics;  Laterality: Left;  150   I & D KNEE WITH POLY EXCHANGE Left 06/07/2022   Procedure: IRRIGATION AND DEBRIDEMENT KNEE WITH POLY EXCHANGE;  Surgeon: Tammy Frederic, MD;  Location: [facility];  Service: Orthopedics;  Laterality: Left;   INCISION AND DRAINAGE OF WOUND Left 06/04/2022   Procedure: IRRIGATION AND DEBRIDEMENT KNEE;   Surgeon: Tammy Frederic, MD;  Location: [facility];  Service: Orthopedics;  Laterality: Left;  60   INCISION AND DRAINAGE OF WOUND Left 06/15/2022   Procedure: excision of knee wound with Myriad;  Surgeon: Tammy Form, DO;  Location: [facility];  Service: Plastics;  Laterality: Left;   ORIF FEMUR FRACTURE Left 04/10/2021   Procedure: OPEN REDUCTION INTERNAL FIXATION (ORIF) DISTAL FEMUR FRACTURE;  Surgeon: Myrene Galas, MD;  Location: [facility];  Service: Orthopedics;  Laterality: Left;   PARTIAL KNEE ARTHROPLASTY Left 04/30/2022   Procedure: DISTAL FEMUR REPLACEMENT;  Surgeon: Tammy Frederic, MD;  Location: [facility];  Service: Orthopedics;  Laterality: Left;  150    Family History  Problem Relation Age of Onset   Other Mother        blood clots   Diabetes Sister    Neuropathy Sister        diabetic neuropathy   Diabetes Brother    Neuropathy Brother        diabetic neuropathy   Breast cancer Maternal Aunt        28   Colon cancer Maternal Aunt 47   Diabetes Paternal Aunt    Breast cancer Other        PGM's sister; dx mid 36s   Colon cancer Other        PGM's brother; dx 44s   Ovarian cancer Other        PGM's mother; dx unknown age   Esophageal cancer Neg Hx    Rectal cancer Neg Hx    Stomach cancer Neg Hx    Colon polyps Neg Hx     Social History   Social History Narrative   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   Caffeine: 1 cup in the morning      Review of Systems General: Denies fevers or chills Skin: Endorses drainage from the wound  Physical Exam    08/25/2022   11:43 AM 08/25/2022   10:20 AM 08/25/2022   10:09 AM  Vitals with BMI  Systolic 154 138 272  Diastolic 84 76 88  Pulse 80  74    General:  No acute distress, nontoxic appearing  Respiratory: No increased work of breathing Neuro: Alert and oriented Psychiatric: Normal mood and affect  Skin: Excellent granular tissue noted over the surgical site with the exception of a 1.25 x 1.25  cm area over prepatellar bursa.  Questionable visualization of patellar ligament versus slough.  See picture.    Assessment/Plan  Femoral fracture with subsequent note with, intercondylar extension, hardware removal, and distal femur replacement 04/2022 with surgical dehiscence now s/p debridement and washout with wound VAC placement 05/2022 with chronic slow healing wound:  She has had tremendous granulation over much of the wound, however over the superior aspect there is an area that has not had much tissue formation.  Unfortunately it appears to be at least 0.75 cm depth and could potentially be tracking down towards the bursa.  She has not had any considerable improvement since her last wound VAC change 8 days ago.  Believe that surgical intervention including possible rotational flap may be needed.  Discussed case with Dr. Ulice [name] who reviewed images and wants patient to return to the OR for debridement and myriad replacement with continued wound VAC.  Will send route to surgical coordinator and they will call patient to schedule surgery.   Evelena Leyden 08/25/2022, 1:19 PM

## 2022-08-26 LAB — COMPLETE METABOLIC PANEL WITH GFR
AG Ratio: 1 (calc) (ref 1.0–2.5)
ALT: 13 U/L (ref 6–29)
AST: 21 U/L (ref 10–35)
Albumin: 3.2 g/dL — ABNORMAL LOW (ref 3.6–5.1)
Alkaline phosphatase (APISO): 77 U/L (ref 37–153)
BUN: 8 mg/dL (ref 7–25)
CO2: 26 mmol/L (ref 20–32)
Calcium: 8.8 mg/dL (ref 8.6–10.4)
Chloride: 104 mmol/L (ref 98–110)
Creat: 0.53 mg/dL (ref 0.50–1.05)
Globulin: 3.2 g/dL (calc) (ref 1.9–3.7)
Glucose, Bld: 98 mg/dL (ref 65–99)
Potassium: 3.6 mmol/L (ref 3.5–5.3)
Sodium: 141 mmol/L (ref 135–146)
Total Bilirubin: 0.2 mg/dL (ref 0.2–1.2)
Total Protein: 6.4 g/dL (ref 6.1–8.1)
eGFR: 101 mL/min/{1.73_m2} (ref >=60)

## 2022-08-26 LAB — CBC
HCT: 30 % — ABNORMAL LOW (ref 35.0–45.0)
Hemoglobin: 9.4 g/dL — ABNORMAL LOW (ref 11.7–15.5)
MCH: 27.2 pg (ref 27.0–33.0)
MCHC: 31.3 g/dL — ABNORMAL LOW (ref 32.0–36.0)
MCV: 86.7 fL (ref 80.0–100.0)
MPV: 9.4 fL (ref 7.5–12.5)
Platelets: 441 10*3/uL — ABNORMAL HIGH (ref 140–400)
RBC: 3.46 10*6/uL — ABNORMAL LOW (ref 3.80–5.10)
RDW: 14 % (ref 11.0–15.0)

## 2022-08-26 LAB — C-REACTIVE PROTEIN: CRP: 47.9 mg/L — ABNORMAL HIGH (ref <8.0)

## 2022-09-02 ENCOUNTER — Telehealth: Payer: Self-pay

## 2022-09-02 ENCOUNTER — Telehealth: Payer: Self-pay | Admitting: *Deleted

## 2022-09-02 NOTE — Telephone Encounter (Signed)
I called and talked to Ms. Tammy [name]. She has been in touch with [employer] and they are delivering wound vac supplies. She states she didn't know she could order supplies directly because they had always sent them automatically. She also mentioned concern about her lab work, states "my labs aren't too good" and asked if there was anything she could do to prevent her surgery from being cancelled. I advised I would send a message to Gerre Pebbles since her pre-op is with him next week. She states she will call if she has any questions or concerns before then.

## 2022-09-02 NOTE — Telephone Encounter (Signed)
Returned pt's call from a voicemail left by pt requesting wound vac canisters because she ran out and didn't know how to order more. Per Joss, pt can come by and pick some up while she waits for her order to be delivered.   I advised pt to call [employer] at the number on her wound vac and request more canisters however, she can come by tomorrow, 7/24 and pick up some. Pt conveyed understanding.

## 2022-09-08 ENCOUNTER — Ambulatory Visit (INDEPENDENT_AMBULATORY_CARE_PROVIDER_SITE_OTHER): Payer: Medicare Other | Admitting: Physician Assistant

## 2022-09-08 VITALS — BP 167/96 | HR 90

## 2022-09-08 DIAGNOSIS — S81002D Unspecified open wound, left knee, subsequent encounter: Secondary | ICD-10-CM

## 2022-09-08 MED ORDER — PROMETHAZINE HCL 12.5 MG PO TABS: 12.5000 mg | ORAL_TABLET | Freq: Three times a day (TID) | ORAL | 1 refills | Status: DC | PRN

## 2022-09-08 NOTE — Progress Notes (Cosign Needed Addendum)
Patient ID: Tammy [name], female    DOB: 11/13/1955, 67 y.o.   MRN: [id number]  Chief Complaint  Patient presents with   Pre-op Exam      ICD-10-CM   1. Open knee wound, left, subsequent encounter  S81.002D        History of Present Illness: Tammy [name] is a 67 y.o.  female  with a history of left knee wound with previous myriad and wound VAC placement 06/15/2022.  She presents for preoperative evaluation for upcoming procedure, debridement of chronic left knee wound with placement of myriad and wound VAC, scheduled for 09/14/2022 with Dr. Ulice Bold.  The patient has not had problems with anesthesia.  Today, she is accompanied by her husband at bedside.  She states that she was quite active prior to her injury and hopes to return to similar state postoperatively.  Former smoker, quit over 1 year ago.  No ongoing nicotine containing products.  She is currently on antibiotics per ID and her PCP is managing her pain symptoms.  She requires use of assistive walker.  Denies any severe cardiac or pulmonary disease, varicosities, or use of hormone replacement or modifying agents aside from anastrozole which she can continue perioperatively.  She states that she has not seen her neurologist in several years.  Will request surgical clearance from her primary care provider as well as specific clearance to hold her ASA 5 days prior to surgery, restart 2 days after.  This would be helpful if possible given her recent anemia requiring transfusions.  HTN well-controlled at home, 130s over 80s.  Discussed the risks of this specific surgery as well as the potential for failure.  If the residual wound continues to persist postoperatively, will likely need to re-involve orthopedics.  She voices understanding and is agreeable to the plan.  Summary of Previous Visit: She was last seen here most recently 08/25/2022.  At that time, she reported a yellowish-Tammy [name] drainage as well as ongoing deep  discomfort from left knee.  HH[facility] had been performing VAC changes at home as scheduled.  Her wound and drainage has been persistent for weeks and previous discussion suggested return to the OR if symptoms had not improved.  Given the lack of any considerable improvement, discussed possible need for surgical intervention.  Dr. Ulice Bold advised return to the OR for repeat debridement with myriad and wound VAC placement.  PMH Significant for: Comminuted displaced fracture of distal left femur metaphysis with ORIF 04/11/2022, subsequent chronic wound requiring wound VAC and wound matrix placement, HTN, liver cirrhosis, hepatitis C, breast cancer 2022 s/p lumpectomy and radiation on anastrozole, CVA not on anticoagulation, anemia with baseline hemoglobin 10-11 prior to her injury 04/2022.  She has since required blood transfusions with hemoglobin in the 6'S.  Hemoglobin 9.4 most recent labs, improved from past couple of months.   Past Medical History: Allergies: Allergies  Allergen Reactions   Lisinopril Cough    Current Medications:  Current Outpatient Medications:    acetaminophen (TYLENOL) 500 MG tablet, Take 2 tablets (1,000 mg total) by mouth every 8 (eight) hours as needed for moderate pain, mild pain, fever or headache., Disp: 30 tablet, Rfl: 0   amLODipine (NORVASC) 10 MG tablet, Take 1 tablet (10 mg total) by mouth daily., Disp: 30 tablet, Rfl: 0   amoxicillin (AMOXIL) 500 MG capsule, Take 2 capsules (1,000 mg total) by mouth 3 (three) times daily., Disp: 180 capsule, Rfl: 11   anastrozole (ARIMIDEX) 1 MG tablet, TAKE  1 TABLET BY MOUTH DAILY, Disp: 90 tablet, Rfl: 3   Ascorbic Acid (VITAMIN C) 1000 MG tablet, Take 1,000 mg by mouth daily., Disp: , Rfl:    buPROPion (WELLBUTRIN SR) 150 MG 12 hr tablet, Take 150 mg by mouth 2 (two) times daily., Disp: , Rfl:    CALCIUM PO, Take 500 mg by mouth daily., Disp: , Rfl:    cyanocobalamin (VITAMIN B12) 1000 MCG tablet, Take 1,000 mcg by mouth daily.,  Disp: , Rfl:    gabapentin (NEURONTIN) 100 MG capsule, Take 1 capsule (100 mg total) by mouth 2 (two) times daily. (Patient taking differently: Take 100-300 mg by mouth See admin instructions. Take 100 mg in the morning and 300 mg at bedtime), Disp: 30 capsule, Rfl: 0   hydrALAZINE (APRESOLINE) 25 MG tablet, Take 1 tablet (25 mg total) by mouth 2 (two) times daily., Disp: 60 tablet, Rfl: 0   meloxicam (MOBIC) 15 MG tablet, Take 1 tablet (15 mg total) by mouth daily., Disp: 30 tablet, Rfl: 2   Menthol, Topical Analgesic, (BENGAY EX), Apply 1 Application topically daily as needed (pain)., Disp: , Rfl:    Multiple Vitamin (MULTIVITAMIN) TABS, Take 1 tablet by mouth daily., Disp: 30 tablet, Rfl:    ondansetron (ZOFRAN) 4 MG tablet, Take 1 tablet (4 mg total) by mouth every 8 (eight) hours as needed for nausea or vomiting., Disp: 30 tablet, Rfl: 0   pantoprazole (PROTONIX) 40 MG tablet, Take 1 tablet (40 mg total) by mouth daily., Disp: 30 tablet, Rfl: 0   potassium chloride (KLOR-CON) 10 MEQ tablet, Take 10 mEq by mouth daily., Disp: , Rfl:    promethazine (PHENERGAN) 12.5 MG tablet, Take 1 tablet (12.5 mg total) by mouth every 8 (eight) hours as needed for nausea or vomiting., Disp: 20 tablet, Rfl: 1   REGLAN 10 MG tablet, Take 10 mg by mouth daily as needed for nausea., Disp: , Rfl:    zinc gluconate 50 MG tablet, Take 50 mg by mouth daily., Disp: , Rfl:   Past Medical Problems: Past Medical History:  Diagnosis Date   Anemia    hx of   Anxiety    on meds   Arthritis    back/bilateral ankles   Bipolar disorder (HCC)    Breast cancer (HCC) 2022   LEFT breast-radiation   Depression    on meds   Family history of breast cancer 07/10/2020   Family history of colon cancer 07/10/2020   Family history of ovarian cancer 07/10/2020   GERD (gastroesophageal reflux disease)    on meds   Heart murmur    dx by PCP   Hepatitis C    HTN (hypertension)    on meds   Hx of radiation therapy    Liver  cirrhosis (HCC)    from hep c   Neuromuscular disorder (HCC)    Osteopenia    Stroke (HCC) 09/2012   slurred speech and LEFT sided weakness/notes 10/04/2012    Past Surgical History: Past Surgical History:  Procedure Laterality Date   ANKLE FRACTURE SURGERY Left 2018   APPLICATION OF WOUND VAC Left 06/15/2022   Procedure: APPLICATION OF WOUND VAC;  Surgeon: Peggye Form, DO;  Location: [facility];  Service: Plastics;  Laterality: Left;   BREAST LUMPECTOMY WITH RADIOACTIVE SEED AND SENTINEL LYMPH NODE BIOPSY Left 07/26/2020   Procedure: LEFT BREAST LUMPECTOMY WITH RADIOACTIVE SEED AND SENTINEL LYMPH NODE BIOPSY;  Surgeon: Abigail Miyamoto, MD;  Location: [facility];  Service:  General;  Laterality: Left;   CESAREAN SECTION  1986   COLONOSCOPY     DIAGNOSTIC LAPAROSCOPY  1985   HARDWARE REMOVAL Left 04/30/2022   Procedure: HARDWARE REMOVAL LEFT FEMUR;  Surgeon: Samson Frederic, MD;  Location: [facility];  Service: Orthopedics;  Laterality: Left;  150   I & D KNEE WITH POLY EXCHANGE Left 06/07/2022   Procedure: IRRIGATION AND DEBRIDEMENT KNEE WITH POLY EXCHANGE;  Surgeon: Samson Frederic, MD;  Location: [facility];  Service: Orthopedics;  Laterality: Left;   INCISION AND DRAINAGE OF WOUND Left 06/04/2022   Procedure: IRRIGATION AND DEBRIDEMENT KNEE;  Surgeon: Samson Frederic, MD;  Location: [facility];  Service: Orthopedics;  Laterality: Left;  60   INCISION AND DRAINAGE OF WOUND Left 06/15/2022   Procedure: excision of knee wound with Myriad;  Surgeon: Peggye Form, DO;  Location: [facility];  Service: Plastics;  Laterality: Left;   ORIF FEMUR FRACTURE Left 04/10/2021   Procedure: OPEN REDUCTION INTERNAL FIXATION (ORIF) DISTAL FEMUR FRACTURE;  Surgeon: Myrene Galas, MD;  Location: [facility];  Service: Orthopedics;  Laterality: Left;   PARTIAL KNEE ARTHROPLASTY Left 04/30/2022   Procedure: DISTAL FEMUR REPLACEMENT;  Surgeon: Samson Frederic, MD;  Location: [facility];  Service: Orthopedics;   Laterality: Left;  150    Social History: Social History   Socioeconomic History   Marital status: Significant Other    Spouse name: Not on file   Number of children: 2   Years of education: Not on file   Highest education level: Not on file  Occupational History   Occupation: diabled  Tobacco Use   Smoking status: Former    Current packs/day: 1.50    Average packs/day: 1.5 packs/day for 40.0 years (60.0 ttl pk-yrs)    Types: Cigarettes   Smokeless tobacco: Never   Tobacco comments:    has used e-cigarettes, no vaping   Vaping Use   Vaping status: Never Used  Substance and Sexual Activity   Alcohol use: Not Currently   Drug use: No   Sexual activity: Not Currently  Other Topics Concern   Not on file  Social History Narrative   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   Caffeine: 1 cup in the morning    Social Determinants of Health   Financial Resource Strain: Not on file  Food Insecurity: No Food Insecurity (06/04/2022)   Hunger Vital Sign    Worried About Running Out of Food in the Last Year: Never true    Ran Out of Food in the Last Year: Never true  Transportation Needs: No Transportation Needs (06/04/2022)   PRAPARE - Administrator, Civil Service (Medical): No    Lack of Transportation (Non-Medical): No  Physical Activity: Not on file  Stress: Not on file  Social Connections: Not on file  Intimate Partner Violence: Not At Risk (06/04/2022)   Humiliation, Afraid, Rape, and Kick questionnaire    Fear of Current or Ex-Partner: No    Emotionally Abused: No    Physically Abused: No    Sexually Abused: No    Family History: Family History  Problem Relation Age of Onset   Other Mother        blood clots   Diabetes Sister    Neuropathy Sister        diabetic neuropathy   Diabetes Brother    Neuropathy Brother        diabetic neuropathy   Breast cancer Maternal Aunt  50   Colon cancer Maternal Aunt 69   Diabetes Paternal  Aunt    Breast cancer Other        PGM's sister; dx mid 43s   Colon cancer Other        PGM's brother; dx 79s   Ovarian cancer Other        PGM's mother; dx unknown age   Esophageal cancer Neg Hx    Rectal cancer Neg Hx    Stomach cancer Neg Hx    Colon polyps Neg Hx     Review of Systems: ROS Endorses wound malodor, but denies swelling and reports that the drainage is slowing.  Physical Exam: Vital Signs BP (!) 167/96 (BP Location: Right Arm, Patient Position: Sitting, Cuff Size: Normal) Comment: pt reported she's in pain today from her leg  Pulse 90   SpO2 98%   Physical Exam Constitutional:      General: Not in acute distress.    Appearance: Normal appearance. Not ill-appearing.  HENT:     Head: Normocephalic and atraumatic.  Eyes:     Pupils: Pupils are equal, round. Cardiovascular:     Rate and Rhythm: Normal rate.    Pulses: Normal pulses.  Pulmonary:     Effort: No respiratory distress or increased work of breathing.  Speaks in full sentences. Abdominal:     General: Abdomen is flat. No distension.   Musculoskeletal: No varicosities.  No lower extremity swelling or edema.  Wound VAC changes performed and excellent granulation is noted.  See photo.  Wound over superior aspect of granulation that has not had considerable improvement.  Surrounding tissue looks healthy.  No significant malodor. Skin:    General: Skin is warm and dry.     Findings: No erythema or rash.  Neurological:     Mental Status: Alert and oriented to person, place, and time.  Psychiatric:        Mood and Affect: Mood normal.        Behavior: Behavior normal.    Assessment/Plan: The patient is scheduled for debridement of chronic left knee wound with placement of myriad and wound VAC with Dr. Ulice Bold.  Risks, benefits, and alternatives of procedure discussed, questions answered and consent obtained.    Smoking Status: Former smoker.  Caprini Score: 10; Risk Factors include: Age, BMI  greater than 25, history of thrombotic stroke, history of breast cancer, use of assistive walker, and length of planned surgery. Recommendation for mechanical and possibly pharmacological prophylaxis.  Will discuss with surgeon.  Either way, we will encourage early ambulation with use of assistive walker.  Pictures obtained: 08/25/2022  Post-op Rx sent to pharmacy: Phenergan 12.5 mg.  She states that this is more effective than other antiemetics for her.  She reports that she is almost out and will need more ordered.  Pain is already being controlled by her primary care provider and she is on antibiotics per ID.  Patient was provided with the General Surgical Risk consent document and Pain Medication Agreement prior to their appointment.  They had adequate time to read through the risk consent documents and Pain Medication Agreement. We also discussed them in person together during this preop appointment. All of their questions were answered to their satisfaction.  Recommended calling if they have any further questions.  Risk consent form and Pain Medication Agreement to be scanned into patient's chart.   Electronically signed by: Evelena Leyden, PA-C 09/08/2022 10:23 AM

## 2022-09-08 NOTE — H&P (View-Only) (Signed)
Patient ID: Tammy [name], female    DOB: 11/13/1955, 67 y.o.   MRN: [id number]  Chief Complaint  Patient presents with   Pre-op Exam      ICD-10-CM   1. Open knee wound, left, subsequent encounter  S81.002D        History of Present Illness: Tammy [name] is a 67 y.o.  female  with a history of left knee wound with previous myriad and wound VAC placement 06/15/2022.  She presents for preoperative evaluation for upcoming procedure, debridement of chronic left knee wound with placement of myriad and wound VAC, scheduled for 09/14/2022 with Dr. Ulice Bold.  The patient has not had problems with anesthesia.  Today, she is accompanied by her husband at bedside.  She states that she was quite active prior to her injury and hopes to return to similar state postoperatively.  Former smoker, quit over 1 year ago.  No ongoing nicotine containing products.  She is currently on antibiotics per ID and her PCP is managing her pain symptoms.  She requires use of assistive walker.  Denies any severe cardiac or pulmonary disease, varicosities, or use of hormone replacement or modifying agents aside from anastrozole which she can continue perioperatively.  She states that she has not seen her neurologist in several years.  Will request surgical clearance from her primary care provider as well as specific clearance to hold her ASA 5 days prior to surgery, restart 2 days after.  This would be helpful if possible given her recent anemia requiring transfusions.  HTN well-controlled at home, 130s over 80s.  Discussed the risks of this specific surgery as well as the potential for failure.  If the residual wound continues to persist postoperatively, will likely need to re-involve orthopedics.  She voices understanding and is agreeable to the plan.  Summary of Previous Visit: She was last seen here most recently 08/25/2022.  At that time, she reported a yellowish- drainage as well as ongoing deep  discomfort from left knee.  HHN had been performing VAC changes at home as scheduled.  Her wound and drainage has been persistent for weeks and previous discussion suggested return to the OR if symptoms had not improved.  Given the lack of any considerable improvement, discussed possible need for surgical intervention.  Dr. Ulice Bold advised return to the OR for repeat debridement with myriad and wound VAC placement.  PMH Significant for: Comminuted displaced fracture of distal left femur metaphysis with ORIF 04/11/2022, subsequent chronic wound requiring wound VAC and wound matrix placement, HTN, liver cirrhosis, hepatitis C, breast cancer 2022 s/p lumpectomy and radiation on anastrozole, CVA not on anticoagulation, anemia with baseline hemoglobin 10-11 prior to her injury 04/2022.  She has since required blood transfusions with hemoglobin in the 6'S.  Hemoglobin 9.4 most recent labs, improved from past couple of months.   Past Medical History: Allergies: Allergies  Allergen Reactions   Lisinopril Cough    Current Medications:  Current Outpatient Medications:    acetaminophen (TYLENOL) 500 MG tablet, Take 2 tablets (1,000 mg total) by mouth every 8 (eight) hours as needed for moderate pain, mild pain, fever or headache., Disp: 30 tablet, Rfl: 0   amLODipine (NORVASC) 10 MG tablet, Take 1 tablet (10 mg total) by mouth daily., Disp: 30 tablet, Rfl: 0   amoxicillin (AMOXIL) 500 MG capsule, Take 2 capsules (1,000 mg total) by mouth 3 (three) times daily., Disp: 180 capsule, Rfl: 11   anastrozole (ARIMIDEX) 1 MG tablet, TAKE  1 TABLET BY MOUTH DAILY, Disp: 90 tablet, Rfl: 3   Ascorbic Acid (VITAMIN C) 1000 MG tablet, Take 1,000 mg by mouth daily., Disp: , Rfl:    buPROPion (WELLBUTRIN SR) 150 MG 12 hr tablet, Take 150 mg by mouth 2 (two) times daily., Disp: , Rfl:    CALCIUM PO, Take 500 mg by mouth daily., Disp: , Rfl:    cyanocobalamin (VITAMIN B12) 1000 MCG tablet, Take 1,000 mcg by mouth daily.,  Disp: , Rfl:    gabapentin (NEURONTIN) 100 MG capsule, Take 1 capsule (100 mg total) by mouth 2 (two) times daily. (Patient taking differently: Take 100-300 mg by mouth See admin instructions. Take 100 mg in the morning and 300 mg at bedtime), Disp: 30 capsule, Rfl: 0   hydrALAZINE (APRESOLINE) 25 MG tablet, Take 1 tablet (25 mg total) by mouth 2 (two) times daily., Disp: 60 tablet, Rfl: 0   meloxicam (MOBIC) 15 MG tablet, Take 1 tablet (15 mg total) by mouth daily., Disp: 30 tablet, Rfl: 2   Menthol, Topical Analgesic, (BENGAY EX), Apply 1 Application topically daily as needed (pain)., Disp: , Rfl:    Multiple Vitamin (MULTIVITAMIN) TABS, Take 1 tablet by mouth daily., Disp: 30 tablet, Rfl:    ondansetron (ZOFRAN) 4 MG tablet, Take 1 tablet (4 mg total) by mouth every 8 (eight) hours as needed for nausea or vomiting., Disp: 30 tablet, Rfl: 0   pantoprazole (PROTONIX) 40 MG tablet, Take 1 tablet (40 mg total) by mouth daily., Disp: 30 tablet, Rfl: 0   potassium chloride (KLOR-CON) 10 MEQ tablet, Take 10 mEq by mouth daily., Disp: , Rfl:    promethazine (PHENERGAN) 12.5 MG tablet, Take 1 tablet (12.5 mg total) by mouth every 8 (eight) hours as needed for nausea or vomiting., Disp: 20 tablet, Rfl: 1   REGLAN 10 MG tablet, Take 10 mg by mouth daily as needed for nausea., Disp: , Rfl:    zinc gluconate 50 MG tablet, Take 50 mg by mouth daily., Disp: , Rfl:   Past Medical Problems: Past Medical History:  Diagnosis Date   Anemia    hx of   Anxiety    on meds   Arthritis    back/bilateral ankles   Bipolar disorder (HCC)    Breast cancer (HCC) 2022   LEFT breast-radiation   Depression    on meds   Family history of breast cancer 07/10/2020   Family history of colon cancer 07/10/2020   Family history of ovarian cancer 07/10/2020   GERD (gastroesophageal reflux disease)    on meds   Heart murmur    dx by PCP   Hepatitis C    HTN (hypertension)    on meds   Hx of radiation therapy    Liver  cirrhosis (HCC)    from hep c   Neuromuscular disorder (HCC)    Osteopenia    Stroke (HCC) 09/2012   slurred speech and LEFT sided weakness/notes 10/04/2012    Past Surgical History: Past Surgical History:  Procedure Laterality Date   ANKLE FRACTURE SURGERY Left 2018   APPLICATION OF WOUND VAC Left 06/15/2022   Procedure: APPLICATION OF WOUND VAC;  Surgeon: Peggye Form, DO;  Location: [facility];  Service: Plastics;  Laterality: Left;   BREAST LUMPECTOMY WITH RADIOACTIVE SEED AND SENTINEL LYMPH NODE BIOPSY Left 07/26/2020   Procedure: LEFT BREAST LUMPECTOMY WITH RADIOACTIVE SEED AND SENTINEL LYMPH NODE BIOPSY;  Surgeon: Abigail Miyamoto, MD;  Location: [facility];  Service:  General;  Laterality: Left;   CESAREAN SECTION  1986   COLONOSCOPY     DIAGNOSTIC LAPAROSCOPY  1985   HARDWARE REMOVAL Left 04/30/2022   Procedure: HARDWARE REMOVAL LEFT FEMUR;  Surgeon: Samson Frederic, MD;  Location: [facility];  Service: Orthopedics;  Laterality: Left;  150   I & D KNEE WITH POLY EXCHANGE Left 06/07/2022   Procedure: IRRIGATION AND DEBRIDEMENT KNEE WITH POLY EXCHANGE;  Surgeon: Samson Frederic, MD;  Location: [facility];  Service: Orthopedics;  Laterality: Left;   INCISION AND DRAINAGE OF WOUND Left 06/04/2022   Procedure: IRRIGATION AND DEBRIDEMENT KNEE;  Surgeon: Samson Frederic, MD;  Location: [facility];  Service: Orthopedics;  Laterality: Left;  60   INCISION AND DRAINAGE OF WOUND Left 06/15/2022   Procedure: excision of knee wound with Myriad;  Surgeon: Peggye Form, DO;  Location: [facility];  Service: Plastics;  Laterality: Left;   ORIF FEMUR FRACTURE Left 04/10/2021   Procedure: OPEN REDUCTION INTERNAL FIXATION (ORIF) DISTAL FEMUR FRACTURE;  Surgeon: Myrene Galas, MD;  Location: [facility];  Service: Orthopedics;  Laterality: Left;   PARTIAL KNEE ARTHROPLASTY Left 04/30/2022   Procedure: DISTAL FEMUR REPLACEMENT;  Surgeon: Samson Frederic, MD;  Location: [facility];  Service: Orthopedics;   Laterality: Left;  150    Social History: Social History   Socioeconomic History   Marital status: Significant Other    Spouse name: Not on file   Number of children: 2   Years of education: Not on file   Highest education level: Not on file  Occupational History   Occupation: diabled  Tobacco Use   Smoking status: Former    Current packs/day: 1.50    Average packs/day: 1.5 packs/day for 40.0 years (60.0 ttl pk-yrs)    Types: Cigarettes   Smokeless tobacco: Never   Tobacco comments:    has used e-cigarettes, no vaping   Vaping Use   Vaping status: Never Used  Substance and Sexual Activity   Alcohol use: Not Currently   Drug use: No   Sexual activity: Not Currently  Other Topics Concern   Not on file  Social History Narrative   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   Caffeine: 1 cup in the morning    Social Determinants of Health   Financial Resource Strain: Not on file  Food Insecurity: No Food Insecurity (06/04/2022)   Hunger Vital Sign    Worried About Running Out of Food in the Last Year: Never true    Ran Out of Food in the Last Year: Never true  Transportation Needs: No Transportation Needs (06/04/2022)   PRAPARE - Administrator, Civil Service (Medical): No    Lack of Transportation (Non-Medical): No  Physical Activity: Not on file  Stress: Not on file  Social Connections: Not on file  Intimate Partner Violence: Not At Risk (06/04/2022)   Humiliation, Afraid, Rape, and Kick questionnaire    Fear of Current or Ex-Partner: No    Emotionally Abused: No    Physically Abused: No    Sexually Abused: No    Family History: Family History  Problem Relation Age of Onset   Other Mother        blood clots   Diabetes Sister    Neuropathy Sister        diabetic neuropathy   Diabetes Brother    Neuropathy Brother        diabetic neuropathy   Breast cancer Maternal Aunt  50   Colon cancer Maternal Aunt 69   Diabetes Paternal  Aunt    Breast cancer Other        PGM's sister; dx mid 43s   Colon cancer Other        PGM's brother; dx 79s   Ovarian cancer Other        PGM's mother; dx unknown age   Esophageal cancer Neg Hx    Rectal cancer Neg Hx    Stomach cancer Neg Hx    Colon polyps Neg Hx     Review of Systems: ROS Endorses wound malodor, but denies swelling and reports that the drainage is slowing.  Physical Exam: Vital Signs BP (!) 167/96 (BP Location: Right Arm, Patient Position: Sitting, Cuff Size: Normal) Comment: pt reported she's in pain today from her leg  Pulse 90   SpO2 98%   Physical Exam Constitutional:      General: Not in acute distress.    Appearance: Normal appearance. Not ill-appearing.  HENT:     Head: Normocephalic and atraumatic.  Eyes:     Pupils: Pupils are equal, round. Cardiovascular:     Rate and Rhythm: Normal rate.    Pulses: Normal pulses.  Pulmonary:     Effort: No respiratory distress or increased work of breathing.  Speaks in full sentences. Abdominal:     General: Abdomen is flat. No distension.   Musculoskeletal: No varicosities.  No lower extremity swelling or edema.  Wound VAC changes performed and excellent granulation is noted.  See photo.  Wound over superior aspect of granulation that has not had considerable improvement.  Surrounding tissue looks healthy.  No significant malodor. Skin:    General: Skin is warm and dry.     Findings: No erythema or rash.  Neurological:     Mental Status: Alert and oriented to person, place, and time.  Psychiatric:        Mood and Affect: Mood normal.        Behavior: Behavior normal.    Assessment/Plan: The patient is scheduled for debridement of chronic left knee wound with placement of myriad and wound VAC with Dr. Ulice Bold.  Risks, benefits, and alternatives of procedure discussed, questions answered and consent obtained.    Smoking Status: Former smoker.  Caprini Score: 10; Risk Factors include: Age, BMI  greater than 25, history of thrombotic stroke, history of breast cancer, use of assistive walker, and length of planned surgery. Recommendation for mechanical and possibly pharmacological prophylaxis.  Will discuss with surgeon.  Either way, we will encourage early ambulation with use of assistive walker.  Pictures obtained: 08/25/2022  Post-op Rx sent to pharmacy: Phenergan 12.5 mg.  She states that this is more effective than other antiemetics for her.  She reports that she is almost out and will need more ordered.  Pain is already being controlled by her primary care provider and she is on antibiotics per ID.  Patient was provided with the General Surgical Risk consent document and Pain Medication Agreement prior to their appointment.  They had adequate time to read through the risk consent documents and Pain Medication Agreement. We also discussed them in person together during this preop appointment. All of their questions were answered to their satisfaction.  Recommended calling if they have any further questions.  Risk consent form and Pain Medication Agreement to be scanned into patient's chart.   Electronically signed by: Evelena Leyden, PA-C 09/08/2022 10:23 AM

## 2022-09-09 ENCOUNTER — Telehealth: Payer: Self-pay | Admitting: *Deleted

## 2022-09-09 NOTE — Telephone Encounter (Signed)
Request for surgical clearance faxed to office of Dr. Quitman Livings with fax confirmation received. Also called office and left message notifying that we were sending a surgical clearance that needed to be returned Asap

## 2022-09-10 ENCOUNTER — Other Ambulatory Visit: Payer: Self-pay

## 2022-09-10 ENCOUNTER — Encounter (HOSPITAL_COMMUNITY): Payer: Self-pay | Admitting: Plastic Surgery

## 2022-09-10 ENCOUNTER — Telehealth: Payer: Self-pay | Admitting: *Deleted

## 2022-09-10 NOTE — Telephone Encounter (Signed)
Thank you :)

## 2022-09-10 NOTE — Telephone Encounter (Signed)
I spoke to Tammy [name] and gave her the instructions to hold ASA for surgery starting today per Dr. Roseanne Reno. She verbalized understanding.

## 2022-09-10 NOTE — Progress Notes (Addendum)
PCP - Sami, Tammy Reno, MD Cardiologist - denies  EKG - 03/17/22 ECHO - 10/04/12  CPAP - n/a  Fasting Blood Sugar - n/a  Blood Thinner Instructions: n/a Aspirin Instructions - last dose 09/09/22 per patient Patient was instructed: As of today, STOP taking any Aspirin (unless otherwise instructed by your surgeon) Aleve, Naproxen, Ibuprofen, Motrin, Advil, Goody's, BC's, all herbal medications, fish oil, and all vitamins.  ERAS Protcol - n/a  COVID TEST- n/a  Anesthesia review: yes - needs surgical clearance  Patient verbally denies any shortness of breath, fever, cough and chest pain during phone call   -------------  SDW INSTRUCTIONS given:  Your procedure is scheduled on Monday, August 5th, 24.  Report to [facility] Main Entrance "A" at 13:10 A.M., and check in at the Admitting office.  Call this number if you have problems the morning of surgery:  [phone]   Remember:  Do not eat or drink after midnight the night before your surgery    Take these medicines the morning of surgery with A SIP OF WATER: Amlodipine, Amoxil, Bupropion, Hydralazine, Protonix, Anastrozole ORN: Tylenol, Hydrocodone, Phenergan   The day of surgery:                     Do not wear jewelry, make up, or nail polish            Do not wear lotions, powders, perfumes, or deodorant.            Do not shave 48 hours prior to surgery.              Do not bring valuables to the hospital.            [facility] is not responsible for any belongings or valuables.  Do NOT Smoke (Tobacco/Vaping) 24 hours prior to your procedure If you use a CPAP at night, you may bring all equipment for your overnight stay.   Contacts, glasses, dentures or bridgework may not be worn into surgery.      For patients admitted to the hospital, discharge time will be determined by your treatment team.   Patients discharged the day of surgery will not be allowed to drive home, and someone needs to stay with them for 24  hours.    Special instructions:   [place]- Preparing For Surgery  Before surgery, you can play an important role. Because skin is not sterile, your skin needs to be as free of germs as possible. You can reduce the number of germs on your skin by washing with CHG (chlorahexidine gluconate) Soap before surgery.  CHG is an antiseptic cleaner which kills germs and bonds with the skin to continue killing germs even after washing.    Oral Hygiene is also important to reduce your risk of infection.  Remember - BRUSH YOUR TEETH THE MORNING OF SURGERY WITH YOUR REGULAR TOOTHPASTE  Please do not use if you have an allergy to CHG or antibacterial soaps. If your skin becomes reddened/irritated stop using the CHG.  Do not shave (including legs and underarms) for at least 48 hours prior to first CHG shower. It is OK to shave your face.  Please follow these instructions carefully.   Shower the NIGHT BEFORE SURGERY and the MORNING OF SURGERY with DIAL Soap.   Pat yourself dry with a CLEAN TOWEL.  Wear CLEAN PAJAMAS to bed the night before surgery  Place CLEAN SHEETS on your bed the night of your first shower and  DO NOT SLEEP WITH PETS.   Day of Surgery: Please shower morning of surgery  Wear Clean/Comfortable clothing the morning of surgery Do not apply any deodorants/lotions.   Remember to brush your teeth WITH YOUR REGULAR TOOTHPASTE.   Questions were answered. Patient verbalized understanding of instructions.

## 2022-09-10 NOTE — Telephone Encounter (Signed)
I talked to Tammy [name] at [facility]. She said they will fax the medical clearance paperwork to [place] tomorrow. She spoke to Dr. Roseanne Reno during our conversation and he said it was okay for Tammy [name] to hold her ASA for surgery (5 days before).

## 2022-09-14 ENCOUNTER — Other Ambulatory Visit: Payer: Self-pay

## 2022-09-14 ENCOUNTER — Ambulatory Visit (HOSPITAL_BASED_OUTPATIENT_CLINIC_OR_DEPARTMENT_OTHER): Payer: Medicare Other | Admitting: Physician Assistant

## 2022-09-14 ENCOUNTER — Ambulatory Visit (HOSPITAL_COMMUNITY): Payer: Medicare Other | Admitting: Physician Assistant

## 2022-09-14 ENCOUNTER — Ambulatory Visit (HOSPITAL_COMMUNITY)
Admission: RE | Admit: 2022-09-14 | Discharge: 2022-09-14 | Disposition: A | Discharge status: Home / Self Care | Payer: Medicare Other | Attending: Plastic Surgery | Admitting: Plastic Surgery

## 2022-09-14 ENCOUNTER — Encounter (HOSPITAL_COMMUNITY): Payer: Self-pay | Admitting: Plastic Surgery

## 2022-09-14 ENCOUNTER — Encounter (HOSPITAL_COMMUNITY)
Admission: RE | Disposition: A | Discharge status: Home / Self Care | Payer: Self-pay | Source: Home / Self Care | Attending: Plastic Surgery

## 2022-09-14 DIAGNOSIS — K219 Gastro-esophageal reflux disease without esophagitis: Secondary | ICD-10-CM | POA: Diagnosis not present

## 2022-09-14 DIAGNOSIS — Z7982 Long term (current) use of aspirin: Secondary | ICD-10-CM | POA: Insufficient documentation

## 2022-09-14 DIAGNOSIS — I69954 Hemiplegia and hemiparesis following unspecified cerebrovascular disease affecting left non-dominant side: Secondary | ICD-10-CM | POA: Insufficient documentation

## 2022-09-14 DIAGNOSIS — F418 Other specified anxiety disorders: Secondary | ICD-10-CM

## 2022-09-14 DIAGNOSIS — S81002D Unspecified open wound, left knee, subsequent encounter: Secondary | ICD-10-CM

## 2022-09-14 DIAGNOSIS — Z96652 Presence of left artificial knee joint: Secondary | ICD-10-CM

## 2022-09-14 DIAGNOSIS — Z87891 Personal history of nicotine dependence: Secondary | ICD-10-CM | POA: Insufficient documentation

## 2022-09-14 DIAGNOSIS — I10 Essential (primary) hypertension: Secondary | ICD-10-CM

## 2022-09-14 DIAGNOSIS — S81002A Unspecified open wound, left knee, initial encounter: Secondary | ICD-10-CM | POA: Diagnosis not present

## 2022-09-14 DIAGNOSIS — F319 Bipolar disorder, unspecified: Secondary | ICD-10-CM | POA: Insufficient documentation

## 2022-09-14 DIAGNOSIS — X58XXXA Exposure to other specified factors, initial encounter: Secondary | ICD-10-CM | POA: Diagnosis not present

## 2022-09-14 HISTORY — PX: APPLICATION OF WOUND VAC: SHX5189

## 2022-09-14 SURGERY — APPLICATION, SKIN SUBSTITUTE
Anesthesia: General | Site: Knee | Laterality: Left

## 2022-09-14 MED ORDER — SODIUM CHLORIDE 0.9% FLUSH: 3.0000 mL | Freq: Two times a day (BID) | INTRAVENOUS | Status: DC

## 2022-09-14 MED ORDER — CHLORHEXIDINE GLUCONATE 0.12 % MT SOLN
15.0000 mL | Freq: Once | OROMUCOSAL | Status: AC
  Administered 2022-09-14: 15 mL via OROMUCOSAL
  Filled 2022-09-14: qty 15

## 2022-09-14 MED ORDER — PROPOFOL 10 MG/ML IV BOLUS
INTRAVENOUS | Status: AC
  Filled 2022-09-14: qty 20

## 2022-09-14 MED ORDER — CEFAZOLIN SODIUM-DEXTROSE 2-4 GM/100ML-% IV SOLN
2.0000 g | INTRAVENOUS | Status: AC
  Administered 2022-09-14: 2 g via INTRAVENOUS
  Filled 2022-09-14: qty 100

## 2022-09-14 MED ORDER — MIDAZOLAM HCL 2 MG/2ML IJ SOLN
INTRAMUSCULAR | Status: DC | PRN
  Administered 2022-09-14 (×2): 1 mg via INTRAVENOUS

## 2022-09-14 MED ORDER — DEXAMETHASONE SODIUM PHOSPHATE 10 MG/ML IJ SOLN
INTRAMUSCULAR | Status: DC | PRN
  Administered 2022-09-14: 5 mg via INTRAVENOUS

## 2022-09-14 MED ORDER — OXYCODONE HCL 5 MG/5ML PO SOLN
5.0000 mg | Freq: Once | ORAL | Status: AC | PRN
  Administered 2022-09-14: 5 mg via ORAL

## 2022-09-14 MED ORDER — ONDANSETRON HCL 4 MG/2ML IJ SOLN
INTRAMUSCULAR | Status: DC | PRN
  Administered 2022-09-14: 4 mg via INTRAVENOUS

## 2022-09-14 MED ORDER — ONDANSETRON HCL 4 MG/2ML IJ SOLN
INTRAMUSCULAR | Status: AC
  Filled 2022-09-14: qty 2

## 2022-09-14 MED ORDER — LIDOCAINE 2% (20 MG/ML) 5 ML SYRINGE
INTRAMUSCULAR | Status: AC
  Filled 2022-09-14: qty 5

## 2022-09-14 MED ORDER — ACETAMINOPHEN 650 MG RE SUPP: 650.0000 mg | RECTAL | Status: DC | PRN

## 2022-09-14 MED ORDER — FENTANYL CITRATE (PF) 250 MCG/5ML IJ SOLN
INTRAMUSCULAR | Status: DC | PRN
  Administered 2022-09-14 (×3): 25 ug via INTRAVENOUS
  Administered 2022-09-14: 50 ug via INTRAVENOUS
  Administered 2022-09-14: 25 ug via INTRAVENOUS

## 2022-09-14 MED ORDER — ACETAMINOPHEN 10 MG/ML IV SOLN: 1000.0000 mg | Freq: Once | INTRAVENOUS | Status: DC | PRN

## 2022-09-14 MED ORDER — LACTATED RINGERS IV SOLN: INTRAVENOUS | Status: DC

## 2022-09-14 MED ORDER — CHLORHEXIDINE GLUCONATE CLOTH 2 % EX PADS: 6.0000 | MEDICATED_PAD | Freq: Once | CUTANEOUS | Status: DC

## 2022-09-14 MED ORDER — SODIUM CHLORIDE 0.9% FLUSH: 3.0000 mL | INTRAVENOUS | Status: DC | PRN

## 2022-09-14 MED ORDER — ACETAMINOPHEN 160 MG/5ML PO SOLN: 1000.0000 mg | Freq: Once | ORAL | Status: DC | PRN

## 2022-09-14 MED ORDER — OXYCODONE HCL 5 MG PO TABS: 5.0000 mg | ORAL_TABLET | Freq: Once | ORAL | Status: AC | PRN

## 2022-09-14 MED ORDER — DEXAMETHASONE SODIUM PHOSPHATE 10 MG/ML IJ SOLN
INTRAMUSCULAR | Status: AC
  Filled 2022-09-14: qty 1

## 2022-09-14 MED ORDER — ACETAMINOPHEN 500 MG PO TABS: 1000.0000 mg | ORAL_TABLET | Freq: Once | ORAL | Status: DC | PRN

## 2022-09-14 MED ORDER — ACETAMINOPHEN 325 MG PO TABS: 650.0000 mg | ORAL_TABLET | ORAL | Status: DC | PRN

## 2022-09-14 MED ORDER — FENTANYL CITRATE (PF) 250 MCG/5ML IJ SOLN
INTRAMUSCULAR | Status: AC
  Filled 2022-09-14: qty 5

## 2022-09-14 MED ORDER — SODIUM CHLORIDE 0.9 % IR SOLN
Status: DC | PRN
  Administered 2022-09-14 (×2): 1000 mL

## 2022-09-14 MED ORDER — SODIUM CHLORIDE 0.9 % IV SOLN: 250.0000 mL | INTRAVENOUS | Status: DC | PRN

## 2022-09-14 MED ORDER — FENTANYL CITRATE (PF) 100 MCG/2ML IJ SOLN
INTRAMUSCULAR | Status: AC
  Filled 2022-09-14: qty 2

## 2022-09-14 MED ORDER — MIDAZOLAM HCL 2 MG/2ML IJ SOLN
INTRAMUSCULAR | Status: AC
  Filled 2022-09-14: qty 2

## 2022-09-14 MED ORDER — FENTANYL CITRATE (PF) 100 MCG/2ML IJ SOLN: 25.0000 ug | INTRAMUSCULAR | Status: DC | PRN

## 2022-09-14 MED ORDER — ROCURONIUM BROMIDE 10 MG/ML (PF) SYRINGE
PREFILLED_SYRINGE | INTRAVENOUS | Status: AC
  Filled 2022-09-14: qty 10

## 2022-09-14 MED ORDER — OXYCODONE HCL 5 MG/5ML PO SOLN
ORAL | Status: AC
  Filled 2022-09-14: qty 5

## 2022-09-14 MED ORDER — FENTANYL CITRATE (PF) 100 MCG/2ML IJ SOLN
25.0000 ug | INTRAMUSCULAR | Status: DC | PRN
  Administered 2022-09-14 (×2): 50 ug via INTRAVENOUS

## 2022-09-14 MED ORDER — OXYCODONE HCL 5 MG PO TABS: 5.0000 mg | ORAL_TABLET | ORAL | Status: DC | PRN

## 2022-09-14 MED ORDER — ORAL CARE MOUTH RINSE: 15.0000 mL | Freq: Once | OROMUCOSAL | Status: AC

## 2022-09-14 MED ORDER — PROPOFOL 10 MG/ML IV BOLUS
INTRAVENOUS | Status: DC | PRN
Start: 2022-09-14 — End: 2022-09-14
  Administered 2022-09-14: 30 mg via INTRAVENOUS
  Administered 2022-09-14: 130 mg via INTRAVENOUS

## 2022-09-14 SURGICAL SUPPLY — 52 items
APL SKNCLS STERI-STRIP NONHPOA (GAUZE/BANDAGES/DRESSINGS)
BAG COUNTER SPONGE SURGICOUNT (BAG) ×2 IMPLANT
BAG SPNG CNTER NS LX DISP (BAG) ×1
BENZOIN TINCTURE PRP APPL 2/3 (GAUZE/BANDAGES/DRESSINGS) IMPLANT
BNDG CMPR 5X4 KNIT ELC UNQ LF (GAUZE/BANDAGES/DRESSINGS) ×2
BNDG ELASTIC 4INX 5YD STR LF (GAUZE/BANDAGES/DRESSINGS) IMPLANT
BNDG ELASTIC 4X5.8 VLCR STR LF (GAUZE/BANDAGES/DRESSINGS) IMPLANT
BNDG ELASTIC 6X5.8 VLCR STR LF (GAUZE/BANDAGES/DRESSINGS) IMPLANT
BNDG GAUZE DERMACEA FLUFF 4 (GAUZE/BANDAGES/DRESSINGS) IMPLANT
BNDG GZE DERMACEA 4 6PLY (GAUZE/BANDAGES/DRESSINGS) ×2
CANISTER SUCT 3000ML PPV (MISCELLANEOUS) ×2 IMPLANT
COVER SURGICAL LIGHT HANDLE (MISCELLANEOUS) ×2 IMPLANT
DRAPE DERMATAC (DRAPES) IMPLANT
DRAPE HALF SHEET 40X57 (DRAPES) IMPLANT
DRAPE INCISE IOBAN 66X45 STRL (DRAPES) IMPLANT
DRAPE ORTHO SPLIT 77X108 STRL (DRAPES)
DRAPE SURG ORHT 6 SPLT 77X108 (DRAPES) IMPLANT
DRESSING MORCELLS FINE 1000 (Tissue) IMPLANT
DRSG ADAPTIC 3X8 NADH LF (GAUZE/BANDAGES/DRESSINGS) IMPLANT
DRSG CUTIMED SORBACT 7X9 (GAUZE/BANDAGES/DRESSINGS) IMPLANT
DRSG OPSITE 6X11 MED (GAUZE/BANDAGES/DRESSINGS) IMPLANT
DRSG TELFA 3X8 NADH STRL (GAUZE/BANDAGES/DRESSINGS) IMPLANT
ELECT REM PT RETURN 9FT ADLT (ELECTROSURGICAL) ×1
ELECTRODE REM PT RTRN 9FT ADLT (ELECTROSURGICAL) ×2 IMPLANT
GAUZE PAD ABD 8X10 STRL (GAUZE/BANDAGES/DRESSINGS) IMPLANT
GAUZE SPONGE 4X4 12PLY STRL (GAUZE/BANDAGES/DRESSINGS) IMPLANT
GAUZE XEROFORM 5X9 LF (GAUZE/BANDAGES/DRESSINGS) IMPLANT
GLOVE BIO SURGEON STRL SZ 6.5 (GLOVE) ×2 IMPLANT
GLOVE BIO SURGEON STRL SZ8 (GLOVE) IMPLANT
GLOVE BIOGEL PI IND STRL 8 (GLOVE) IMPLANT
GOWN STRL REUS W/ TWL LRG LVL3 (GOWN DISPOSABLE) ×4 IMPLANT
GOWN STRL REUS W/TWL LRG LVL3 (GOWN DISPOSABLE) ×2
GRAFT MYRIAD 3 LAYER 5X5 (Graft) IMPLANT
HANDPIECE INTERPULSE COAX TIP (DISPOSABLE)
JET LAVAGE IRRISEPT WOUND (IRRIGATION / IRRIGATOR) ×1
KIT BASIN OR (CUSTOM PROCEDURE TRAY) ×2 IMPLANT
KIT TURNOVER KIT B (KITS) ×2 IMPLANT
LAVAGE JET IRRISEPT WOUND (IRRIGATION / IRRIGATOR) IMPLANT
NS IRRIG 1000ML POUR BTL (IV SOLUTION) ×2 IMPLANT
PACK GENERAL/GYN (CUSTOM PROCEDURE TRAY) ×2 IMPLANT
PAD ARMBOARD 7.5X6 YLW CONV (MISCELLANEOUS) ×4 IMPLANT
SET HNDPC FAN SPRY TIP SCT (DISPOSABLE) IMPLANT
STAPLER VISISTAT 35W (STAPLE) IMPLANT
SUT MNCRL AB 4-0 PS2 18 (SUTURE) IMPLANT
SUT VIC AB 5-0 P-3 18X BRD (SUTURE) IMPLANT
SUT VIC AB 5-0 P-3 18XBRD (SUTURE) IMPLANT
SUT VIC AB 5-0 P3 18 (SUTURE) ×3
TAPE MEASURE VINYL STERILE (MISCELLANEOUS) IMPLANT
TOWEL GREEN STERILE (TOWEL DISPOSABLE) ×2 IMPLANT
TOWEL GREEN STERILE FF (TOWEL DISPOSABLE) ×2 IMPLANT
UNDERPAD 30X36 HEAVY ABSORB (UNDERPADS AND DIAPERS) ×2 IMPLANT
WATER STERILE IRR 1000ML POUR (IV SOLUTION) IMPLANT

## 2022-09-14 NOTE — Anesthesia Preprocedure Evaluation (Signed)
Anesthesia Evaluation  Patient identified by MRN, date of birth, ID band Patient awake    Reviewed: Allergy & Precautions, NPO status , Patient's Chart, lab work & pertinent test results  History of Anesthesia Complications Negative for: history of anesthetic complications  Airway Mallampati: II  TM Distance: >3 FB Neck ROM: Full    Dental  (+) Dental Advisory Given   Pulmonary neg shortness of breath, neg sleep apnea, neg COPD, neg recent URI, former smoker   breath sounds clear to auscultation       Cardiovascular hypertension, Pt. on medications  Rhythm:Regular     Neuro/Psych   Anxiety Depression Bipolar Disorder   CVA (left weakness), Residual Symptoms    GI/Hepatic ,GERD  Medicated,,(+) Hepatitis -, C  Endo/Other  negative endocrine ROS    Renal/GU negative Renal ROS     Musculoskeletal  (+) Arthritis ,  Surgical wound dehiscence of left knee   Abdominal   Peds  Hematology  (+) Blood dyscrasia (Hgb 8.3), anemia   Anesthesia Other Findings Day of surgery medications reviewed with patient.  Reproductive/Obstetrics                              Anesthesia Physical Anesthesia Plan  ASA: 3  Anesthesia Plan: General   Post-op Pain Management: Ofirmev IV (intra-op)*   Induction: Intravenous  PONV Risk Score and Plan: 3 and Ondansetron, Dexamethasone and Treatment may vary due to age or medical condition  Airway Management Planned: LMA  Additional Equipment: None  Intra-op Plan:   Post-operative Plan: Extubation in OR  Informed Consent: I have reviewed the patients History and Physical, chart, labs and discussed the procedure including the risks, benefits and alternatives for the proposed anesthesia with the patient or authorized representative who has indicated his/her understanding and acceptance.     Dental advisory given  Plan Discussed with: CRNA  Anesthesia Plan  Comments:         Anesthesia Quick Evaluation

## 2022-09-14 NOTE — Progress Notes (Signed)
No labs needed at this time per Dr. Maple 

## 2022-09-14 NOTE — Discharge Instructions (Signed)
Wound Care   Guide to Wound Care  Proper wound care may reduce the risk of infection, improve healing rates, and limit scarring.  This is a general guide to help care for and manage wounds treated with product wound matrix.   Dressing Changes The frequency of dressing changes can vary based on which product was applied, the size of the wound, or the amount of wound drainage. Dressing inspections are recommended, at least weekly.   If you have a Wound VAC it will be changed in one week after the first time it is applied.  Then it will be changed once or twice a week.   If you don't have a Wound VAC, then place KY gel on the wound daily and cover with gauze.  Dressing Types Primary Dressing:  Non-adherent dressing goes directly over wounds being treated with the powder or sheet.  Secondary Dressing:  Secures the primary dressing in place and provides extra protection, compression, and absorption.  1. Wash Hands - To help decrease the risk of infection, caregivers should wash their hands for a minimum of 20 seconds and may use medical gloves.   2. Remove the Dressings - Avoid removing product from the wound by carefully removing the applicable dressing(s) at the time points recommended above, or as recommended by the treating physician.  Expected Color and Odor:  It is entirely normal for the wound to have an unpleasant odor and to form a caramel-colored gel as the product absorbs into the wound. It is important to leave this gel on the wound site.  3. Clean the Wound - Use clean water or saline to gently rinse around the wound surface and remove any excess discharge that may be present on the wound. Do not wipe off any of the caramel-colored gel on the wound.   What to look out for:  Large or increased amount of drainage   Surrounding skin has worsening redness or hot to touch   Increased pain in or around the wound   Flu-like symptoms, fatigue, decreased appetite, fever   Hard, crusty wound  surface with black or brown coloring  4. Apply New Dressings - Dressings should cover the entire wound and be suitable for maintaining a moist wound environment.  The non-adherent mesh dressing should be left in place.  New dressing should consist of KY Jelly to keep the wound moist and soft gauze secured with a wrap or tape.   Maintain a Hydrated Wound Area It is important to keep the wound area moist throughout the healing process. If the wound appears to be dry during dressing changes, select a dressing that will hydrate the wound and maintain that ideal moist environment. If you are unsure what to do, ask the treating physician.  Remodeling Process Every patient heals differently, and no two cases are the same. The size and location of the wound, product type and layering configurations, and general patient health all contribute to how quickly a wound will heal.  While many factors can influence the rate at which the product absorbs, the following can be used as a general guide.   THINGS TO DO: Refrain from smoking High protein diet with plenty of vegetables and some fruit  Limit simple processed carbohydrates and sugar Protect the wound from trauma Protect the dressing  powder       Sheet            Sorbact dressing  

## 2022-09-14 NOTE — Anesthesia Procedure Notes (Signed)
Procedure Name: LMA Insertion Date/Time: 09/14/2022 2:59 PM  Performed by: Shary Decamp, CRNAPre-anesthesia Checklist: Patient identified, Emergency Drugs available, Suction available and Patient being monitored Patient Re-evaluated:Patient Re-evaluated prior to induction Oxygen Delivery Method: Circle system utilized Preoxygenation: Pre-oxygenation with 100% oxygen Induction Type: IV induction Ventilation: Mask ventilation without difficulty LMA: LMA flexible inserted LMA Size: 4.0 Number of attempts: 1 Placement Confirmation: positive ETCO2 and breath sounds checked- equal and bilateral Tube secured with: Tape Dental Injury: Teeth and Oropharynx as per pre-operative assessment

## 2022-09-14 NOTE — Interval H&P Note (Signed)
History and Physical Interval Note:  09/14/2022 1:59 PM  Tammy [name]  has presented today for surgery, with the diagnosis of Left knee wound.  The various methods of treatment have been discussed with the patient and family. After consideration of risks, benefits and other options for treatment, the patient has consented to  Procedure(s): Debridement of chronic left knee wound with placement of myriad and wound VAC replacement (Left) APPLICATION OF WOUND VAC (Left) as a surgical intervention.  The patient's history has been reviewed, patient examined, no change in status, stable for surgery.  I have reviewed the patient's chart and labs.  Questions were answered to the patient's satisfaction.     [name] 

## 2022-09-14 NOTE — Op Note (Signed)
DATE OF OPERATION: 09/14/2022  LOCATION: [facility]  PREOPERATIVE DIAGNOSIS: left knee wound  POSTOPERATIVE DIAGNOSIS: Same  PROCEDURE: Excision of left knee wound skin and soft tissue 5 x 9 cm wound Placement of Myriad 1 gm powder and 5 x 5 cm sheet.  SURGEON: Foster Simpson, DO  ASSISTANT: Evelena Leyden, PA  EBL: none  CONDITION: Stable  COMPLICATIONS: None  INDICATION: The patient, Tammy [name], is a 67 y.o. female born on [birth year], is here for treatment of a left knee wound.   PROCEDURE DETAILS:  The patient was seen prior to surgery and marked.  The IV antibiotics were given. The patient was taken to the operating room and given a general anesthetic. A standard time out was performed and all information was confirmed by those in the room. The left knee was prepped and draped.     The knee was irrigated with saline.  There is a 1 cm opening to the joint with hardware exposed.  The #10 blade was used to excise the 1 x 6 cm wound edge of nonviable skin and soft tissue.  All of the myriad powder and sheet was applied and secured with the 5-0 Vicryl.  The sorbact was applied.  The vac was placed and had an excellent seal. The patient was allowed to wake up and taken to recovery room in stable condition at the end of the case. The family was notified at the end of the case.   The advanced practice practitioner (APP) assisted throughout the case.  The APP was essential in retraction and counter traction when needed to make the case progress smoothly.  This retraction and assistance made it possible to see the tissue plans for the procedure.  The assistance was needed for blood control, tissue re-approximation and assisted with closure of the incision site.

## 2022-09-14 NOTE — Transfer of Care (Signed)
Immediate Anesthesia Transfer of Care Note  Patient: Tammy [name]  Procedure(s) Performed: Debridement of chronic left knee wound with placement of myriad and wound VAC replacement (Left: Knee) APPLICATION OF WOUND VAC (Left: Knee)  Patient Location: PACU  Anesthesia Type:General  Level of Consciousness: awake, drowsy, patient cooperative, and responds to stimulation  Airway & Oxygen Therapy: Patient Spontanous Breathing and Patient connected to face mask oxygen  Post-op Assessment: Report given to RN and Post -op Vital signs reviewed and stable  Post vital signs: Reviewed and stable  Last Vitals:  Vitals Value Taken Time  BP 149/66 09/14/22 1530  Temp    Pulse 55 09/14/22 1531  Resp 18 09/14/22 1531  SpO2 94 % 09/14/22 1531  Vitals shown include unfiled device data.  Last Pain:  Vitals:   09/14/22 1351  TempSrc:   PainSc: 5          Complications: No notable events documented.

## 2022-09-15 ENCOUNTER — Encounter (HOSPITAL_COMMUNITY): Payer: Self-pay | Admitting: Plastic Surgery

## 2022-09-15 NOTE — Anesthesia Postprocedure Evaluation (Signed)
Anesthesia Post Note  Patient: Tammy [name]  Procedure(s) Performed: Debridement of chronic left knee wound with placement of myriad and wound VAC replacement (Left: Knee) APPLICATION OF WOUND VAC (Left: Knee)     Patient location during evaluation: PACU Anesthesia Type: General Level of consciousness: awake and alert Pain management: pain level controlled Vital Signs Assessment: post-procedure vital signs reviewed and stable Respiratory status: spontaneous breathing, nonlabored ventilation, respiratory function stable and patient connected to nasal cannula oxygen Cardiovascular status: blood pressure returned to baseline and stable Postop Assessment: no apparent nausea or vomiting Anesthetic complications: no   No notable events documented.  Last Vitals:  Vitals:   09/14/22 1600 09/14/22 1606  BP: (!) 161/79 (!) 161/79  Pulse: 73 78  Resp: 14 17  Temp: 37.1 C   SpO2: 95% 96%    Last Pain:  Vitals:   09/14/22 1600  TempSrc:   PainSc: 3                   

## 2022-09-18 ENCOUNTER — Telehealth: Payer: Self-pay

## 2022-09-18 ENCOUNTER — Telehealth (INDEPENDENT_AMBULATORY_CARE_PROVIDER_SITE_OTHER): Payer: Medicare Other | Admitting: Internal Medicine

## 2022-09-18 ENCOUNTER — Other Ambulatory Visit: Payer: Self-pay

## 2022-09-18 DIAGNOSIS — T8450XA Infection and inflammatory reaction due to unspecified internal joint prosthesis, initial encounter: Secondary | ICD-10-CM

## 2022-09-18 DIAGNOSIS — T847XXD Infection and inflammatory reaction due to other internal orthopedic prosthetic devices, implants and grafts, subsequent encounter: Secondary | ICD-10-CM

## 2022-09-18 DIAGNOSIS — M869 Osteomyelitis, unspecified: Secondary | ICD-10-CM

## 2022-09-18 NOTE — Telephone Encounter (Signed)
Informed pt that HHN could change her wound vac today and we would see her in clinic next week. Pt conveyed understanding.

## 2022-09-18 NOTE — Progress Notes (Signed)
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 04/30/2022   Hypokalemia 04/16/2021   Hip fracture requiring operative repair (HCC) 04/16/2021   Vitamin D deficiency 04/12/2021   Hyponatremia 04/11/2021   Acute postoperative anemia due to expected blood loss 04/11/2021   Class 1 obesity 04/11/2021   Left comminuted and displaced fracture of the distal femur metaphysis 04/08/2021   History of CVA (cerebrovascular accident) 04/08/2021   Genetic testing 07/17/2020   Family history of breast cancer 07/10/2020   Family history of ovarian cancer 07/10/2020   Family history of colon cancer 07/10/2020   Malignant neoplasm of upper-outer quadrant of left breast in female, estrogen receptor positive (HCC) 07/09/2020   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   Chronic pain syndrome 10/06/2012   Nicotine dependence 10/06/2012   Anxiety state, unspecified 10/05/2012   Bipolar disorder, unspecified (HCC) 10/05/2012   Slurred speech 10/04/2012   Left-sided weakness 10/04/2012   Hyperkalemia 10/04/2012   Acute CVA (cerebrovascular accident) (HCC) 10/04/2012   Hypertension       Subjective:    Patient ID: Tammy [name], female    DOB: 08/05/1955, 67 y.o.   MRN: [id number]  No chief complaint on file.   HPI:  Tammy [name] is a 67 y.o. 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 Likely will need long term antibiotics suppression She was discharged 2-3 weeks ago; s/p I&D in hospital and cx e faecalis Discharged on ampicillin  Diarrhea on unasyn but not amp 6 weeks iv planned till 07/19/22 then indefinite suppression   Still have wound vac and was changed twice last 2-3 weeks by her surgeon's office Minimal pain if at all; no purulence out of vac. Granulating tissue in wound   ---------- 08/25/22 id clinic f/u She should have finished iv abx on 07/19/22 and then go on amoxicillin for indefinite suppression in setting hardware in the infected site (partial hardware removal and new hardware placement at times of I&D) She takes amoxicillin as rx'ed 3 times a day 1 gram Some nausea with meds No diarrhea  Some purulence and wound break down and pain No fever chill  See surgery today to see if more surgery needed  Asking for phenergan refill  Bp high    09/18/22 id clinic f/u See below a&P     Allergies  Allergen Reactions   Lisinopril Cough      Outpatient Medications Prior to Visit  Medication Sig Dispense Refill   acetaminophen (TYLENOL) 500 MG tablet Take 2 tablets (1,000 mg total) by mouth every 8 (eight) hours as needed for moderate pain, mild pain, fever or headache. 30 tablet 0  amLODipine (NORVASC) 10 MG tablet Take 1 tablet (10 mg total) by mouth daily. 30 tablet 0   amoxicillin (AMOXIL) 500 MG capsule Take 2 capsules (1,000 mg total) by mouth 3 (three) times daily. 180 capsule 11   anastrozole (ARIMIDEX) 1 MG tablet TAKE 1 TABLET BY MOUTH DAILY 90 tablet 3   Ascorbic Acid (VITAMIN C) 1000 MG tablet Take 1,000  mg by mouth daily.     buPROPion (WELLBUTRIN SR) 150 MG 12 hr tablet Take 150 mg by mouth 2 (two) times daily.     CALCIUM PO Take 500 mg by mouth daily.     Cyanocobalamin (VITAMIN B-12 PO) Take 2,000 mcg by mouth daily.     gabapentin (NEURONTIN) 100 MG capsule Take 1 capsule (100 mg total) by mouth 2 (two) times daily. (Patient taking differently: Take 300 mg by mouth at bedtime.) 30 capsule 0   hydrALAZINE (APRESOLINE) 25 MG tablet Take 1 tablet (25 mg total) by mouth 2 (two) times daily. 60 tablet 0   HYDROcodone-acetaminophen (NORCO) 10-325 MG tablet Take 1 tablet by mouth every 4 (four) hours as needed for severe pain or moderate pain.     meloxicam (MOBIC) 15 MG tablet Take 1 tablet (15 mg total) by mouth daily. (Patient taking differently: Take 15 mg by mouth daily as needed for pain.) 30 tablet 2   Menthol, Topical Analgesic, (BENGAY EX) Apply 1 Application topically daily as needed (pain).     Multiple Vitamin (MULTIVITAMIN) TABS Take 1 tablet by mouth daily. 30 tablet    ondansetron (ZOFRAN) 4 MG tablet Take 1 tablet (4 mg total) by mouth every 8 (eight) hours as needed for nausea or vomiting. (Patient not taking: Reported on 09/09/2022) 30 tablet 0   pantoprazole (PROTONIX) 40 MG tablet Take 1 tablet (40 mg total) by mouth daily. 30 tablet 0   potassium chloride (KLOR-CON) 10 MEQ tablet Take 10 mEq by mouth daily.     promethazine (PHENERGAN) 12.5 MG tablet Take 1 tablet (12.5 mg total) by mouth every 8 (eight) hours as needed for nausea or vomiting. 20 tablet 1   zinc gluconate 50 MG tablet Take 50 mg by mouth daily.     No facility-administered medications prior to visit.     Social History   Socioeconomic History   Marital status: Significant Other    Spouse name: Not on file   Number of children: 2   Years of education: Not on file   Highest education level: Not on file  Occupational History   Occupation: diabled  Tobacco Use   Smoking status: Former    Current  packs/day: 1.50    Average packs/day: 1.5 packs/day for 40.0 years (60.0 ttl pk-yrs)    Types: Cigarettes   Smokeless tobacco: Never   Tobacco comments:    has used e-cigarettes, no vaping   Vaping Use   Vaping status: Never Used  Substance and Sexual Activity   Alcohol use: Not Currently   Drug use: No   Sexual activity: Not Currently  Other Topics Concern   Not on file  Social History Narrative   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   Caffeine: 1 cup in the morning    Social Determinants of Health   Financial Resource Strain: Not on file  Food Insecurity: No Food Insecurity (06/04/2022)   Hunger Vital Sign    Worried About Running Out of Food in the Last Year: Never true    Ran  Out of Food in the Last Year: Never true  Transportation Needs: No Transportation Needs (06/04/2022)   PRAPARE - Administrator, Civil Service (Medical): No    Lack of Transportation (Non-Medical): No  Physical Activity: Not on file  Stress: Not on file  Social Connections: Not on file  Intimate Partner Violence: Not At Risk (06/04/2022)   Humiliation, Afraid, Rape, and Kick questionnaire    Fear of Current or Ex-Partner: No    Emotionally Abused: No    Physically Abused: No    Sexually Abused: No      Review of Systems     Objective:    There were no vitals taken for this visit. Nursing note and vital signs reviewed.  Physical Exam     Video visit No distress, fully conversant Dressing knee clean/dry Normal respiratory effort    Labs: Lab Results  Component Value Date   WBC 6.2 08/25/2022   HGB 9.4 (L) 08/25/2022   HCT 30.0 (L) 08/25/2022   MCV 86.7 08/25/2022   PLT 441 (H) 08/25/2022   Last metabolic panel Lab Results  Component Value Date   GLUCOSE 98 08/25/2022   NA 141 08/25/2022   K 3.6 08/25/2022   CL 104 08/25/2022   CO2 26 08/25/2022   BUN 8 08/25/2022   CREATININE 0.53 08/25/2022   GFRNONAA >60 06/16/2022   CALCIUM 8.8  08/25/2022   PHOS 3.9 06/15/2022   PROT 6.4 08/25/2022   ALBUMIN 3.0 (L) 06/16/2022   LABGLOB 3.3 12/01/2018   BILITOT 0.2 08/25/2022   ALKPHOS 74 06/16/2022   AST 21 08/25/2022   ALT 13 08/25/2022   ANIONGAP 10 06/16/2022    Micro:  Serology:  Imaging:  Assessment & Plan:   Problem List Items Addressed This Visit   None Visit Diagnoses     Osteomyelitis, unspecified site, unspecified type (HCC)    -  Primary   Relevant Orders   C-reactive protein   CBC   COMPLETE METABOLIC PANEL WITH GFR   Hardware complicating wound infection, subsequent encounter       Relevant Orders   C-reactive protein   CBC   COMPLETE METABOLIC PANEL WITH GFR          No orders of the defined types were placed in this encounter.    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 Likely will need long term antibiotics suppression   ------ 5/2 assessment Needing more I&D with plastic surgery As above 4/25 and 4/28 operative cx  e faecalis   Diarrhea with amp-sulb   Will change abx to ampicillin alone. She'll need long term suppressive amoxicillin after 6 weeks of ampicillin, and can start clock on 4/28       ---------------- 07/09/22 id assessment Patient remains on iv  ampicillin via picc without adverse effect She has been seen at surgery clinic recently for wound vac change since discharged from hospital on 06/18/22 She had no diarrhea or n/v/rash  Her last opat labs 5/28 reviewed Crp 2 (<10); sed rate 65  Lft 11/15/88/0.2 Cbc7/10/300; cr 0.63; alb 4  08/25/22 id clinic assessment Concerning purulence/pain and still not yet all closed up wound Labs today to assess abx toxicity/inflammatory changes Phenergan refill If I&D needed will likely need to switch [name] to iv abx for a few weeks before going to amox again. And she should be seen by inpatient ID team Otherwise see me in 3-4 weeks Continue amox for now   09/18/22 id clinic assessment Patient had plastic surgery debridement of the wound skin/soft tissue and placement of another graft along with myriad. I reviewed operative note. 1 cm opening noted within the knee joint with hardware exposed. No pus described during surgery No cultures done She said she feels very good with the knee much better in terms of pain/discomfort No purulence Minimal output into the wound vac She continues on amoxicillin  Will get labs next week and see her in 6-8 week  I have spent a total of 30 minutes of face-to-face and non-face-to-face time, excluding clinical staff time, preparing to see patient, ordering tests and/or medications, and provide counseling the patient   Follow-up: Return in about 6 weeks (around 10/30/2022).      Raymondo Band, MD [facility] 09/18/2022, 9:56 AM

## 2022-09-22 ENCOUNTER — Ambulatory Visit (INDEPENDENT_AMBULATORY_CARE_PROVIDER_SITE_OTHER): Payer: Medicare Other | Admitting: Plastic Surgery

## 2022-09-22 ENCOUNTER — Other Ambulatory Visit: Payer: Self-pay

## 2022-09-22 ENCOUNTER — Other Ambulatory Visit: Payer: Medicare Other

## 2022-09-22 DIAGNOSIS — T84498A Other mechanical complication of other internal orthopedic devices, implants and grafts, initial encounter: Secondary | ICD-10-CM

## 2022-09-22 DIAGNOSIS — T847XXD Infection and inflammatory reaction due to other internal orthopedic prosthetic devices, implants and grafts, subsequent encounter: Secondary | ICD-10-CM

## 2022-09-22 DIAGNOSIS — T8131XA Disruption of external operation (surgical) wound, not elsewhere classified, initial encounter: Secondary | ICD-10-CM

## 2022-09-22 DIAGNOSIS — M869 Osteomyelitis, unspecified: Secondary | ICD-10-CM

## 2022-09-22 DIAGNOSIS — S81002D Unspecified open wound, left knee, subsequent encounter: Secondary | ICD-10-CM

## 2022-09-22 LAB — CBC
HCT: 28.1 % — ABNORMAL LOW (ref 35.0–45.0)
Hemoglobin: 9 g/dL — ABNORMAL LOW (ref 11.7–15.5)
MCH: 27.3 pg (ref 27.0–33.0)
MCHC: 32 g/dL (ref 32.0–36.0)
MCV: 85.2 fL (ref 80.0–100.0)
MPV: 10.4 fL (ref 7.5–12.5)
Platelets: 358 10*3/uL (ref 140–400)
RBC: 3.3 10*6/uL — ABNORMAL LOW (ref 3.80–5.10)
RDW: 14.5 % (ref 11.0–15.0)
WBC: 6.6 10*3/uL (ref 3.8–10.8)

## 2022-09-22 NOTE — Progress Notes (Signed)
   Subjective:    Patient ID: Tammy [name], female    DOB: 22-Apr-1955, 67 y.o.   MRN: [id number]  The patient is a very sweet 67 year old female here for follow-up on her left knee.  I did tell her that the hardware from her femur is exposed.  She actually knew that already because she had seen that during one of the dressing changes.  The area looks really good surrounding the open wound.  She has been using the [facility] without any difficulty.  She has not had any pain and it is felt the best it has in a while.      Review of Systems  Constitutional: Negative.   Eyes: Negative.   Respiratory: Negative.    Cardiovascular: Negative.   Gastrointestinal: Negative.   Endocrine: Negative.        Objective:   Physical Exam Constitutional:      Appearance: Normal appearance.  Cardiovascular:     Rate and Rhythm: Normal rate.     Pulses: Normal pulses.  Skin:    General: Skin is warm.     Capillary Refill: Capillary refill takes less than 2 seconds.     Findings: Lesion present.  Neurological:     Mental Status: She is alert and oriented to person, place, and time.  Psychiatric:        Mood and Affect: Mood normal.        Behavior: Behavior normal.        Thought Content: Thought content normal.        Judgment: Judgment normal.        Assessment & Plan:     ICD-10-CM   1. Surgical wound dehiscence, initial encounter  T81.31XA        The sorbact is still in place and I went ahead and changed the VAC sponge.  We will get her an appointment to see Dr. [name] who I informed of the situation last week.  I would like to see her [name] in 1 to 2 weeks.

## 2022-09-28 ENCOUNTER — Other Ambulatory Visit: Payer: Self-pay | Admitting: Adult Health

## 2022-09-28 DIAGNOSIS — R928 Other abnormal and inconclusive findings on diagnostic imaging of breast: Secondary | ICD-10-CM

## 2022-09-30 ENCOUNTER — Telehealth: Payer: Self-pay | Admitting: Plastic Surgery

## 2022-09-30 ENCOUNTER — Telehealth: Payer: Self-pay | Admitting: *Deleted

## 2022-09-30 NOTE — Telephone Encounter (Signed)
Melissa from [facility] called and needs the following information asap for this patient because she is at risk to have her wound vac discontinued.  Please call her at [phone] ext. [phone] or fax her at [phone].  Ref # [id number]  Information needed:  Measurements for 6/17 - 7/1, 7/18 - 8/1, plus clinical notes

## 2022-09-30 NOTE — Telephone Encounter (Signed)
Received request from [facility] for recent wound measurement and notes.    Faxed recent office notes with recent wound measurements to [facility].  Confirmation received and copy scanned into the chart.//AB/CMA

## 2022-10-01 ENCOUNTER — Telehealth: Payer: Self-pay | Admitting: Hematology

## 2022-10-01 NOTE — Progress Notes (Signed)
Patient is a pleasant 67 year old female with PMH of left knee wound s/p myriad and wound VAC placement performed by Dr. Ulice Bold 06/15/2022 and again most recently on 09/14/2022 who returns to clinic for postoperative follow-up and VAC change.  She was last seen here in clinic on 09/22/2022.  At that time, she informed that the hardware from her distal femoral injury was exposed in the operating room which has been causing her healing issues.  The remainder of her wound had excellent granulation and would otherwise be amenable to STSG.  Plan was for her to follow-up with Dr. [name] for management given hardware exposure.  Today, patient is doing okay.  She is accompanied by her husband at bedside.  She states that she has not had any issues with her wound VAC.  She states that her leg feels stronger.  Denies any pain or discomfort.  On exam, removed her wound VAC.  Hardware is easily visualized through a 1 x 1 cm wound.  The remainder of her approximately 9 x 4.5 cm wound has good granular tissue and advancing epithelialization.  No obvious infection or considerable drainage appreciated.  Replace wound VAC followed by Kerlix and Ace wrap.  Patient is ambulatory with assistance of walker.  Pedal pulse intact, good sensation distally.  She understands that she will need to see orthopedics for definitive management.  She has an appointment with Dr. [name] on Monday.  We will be able to remain available to support as needed.  However, was clear with patient that the wound VAC and/or myriad placement will not be resolving her issue.  They voiced understanding and are agreeable to the plan.  Follow-up in 2 to 3 weeks with Dr. Ulice Bold.  Picture(s) obtained of the patient and placed in the chart were with the patient's or guardian's permission.

## 2022-10-01 NOTE — Telephone Encounter (Signed)
Received on (8/7,8/8,8/16) via of fax a request for wound measurements from [facility] therapy.    Faxed office notes and recent last OP notes to [facility].  Confirmation received and copy scanned into the chart.//AB/CMA

## 2022-10-01 NOTE — Telephone Encounter (Addendum)
Call from [facility] with [employer] requesting more wound measurements for continuation of wound vac.  Faxed recent office notes with measurements.  Last measurements:knee wound:(5x9x1cm) was given to [facility] verbally.//AB/CMA

## 2022-10-02 ENCOUNTER — Ambulatory Visit: Payer: Medicare Other | Admitting: Physician Assistant

## 2022-10-02 ENCOUNTER — Encounter: Payer: Self-pay | Admitting: Physician Assistant

## 2022-10-02 VITALS — BP 160/98 | HR 79

## 2022-10-02 DIAGNOSIS — S81002D Unspecified open wound, left knee, subsequent encounter: Secondary | ICD-10-CM | POA: Diagnosis not present

## 2022-10-07 ENCOUNTER — Encounter: Payer: Medicare Other | Admitting: Surgical

## 2022-10-12 NOTE — Assessment & Plan Note (Signed)
-  T1bN0M0, Stage IA, G1 ductal carcinoma, ER+/PR+/HER2-,  diagnosed in 06/2020 -S/P lumpectomy and sentinel lymph node biopsy in June 2022 -Oncotype recurrence score 16 -She received adjuvant radiation -She has been on adjuvant anastrozole since September 2022.  She is tolerating well, plan for 5-7 years -Patient is clinically doing well, exam unremarkable, lab reviewed, there is no clinical concern for recurrence. -Will continue cancer surveillance

## 2022-10-13 ENCOUNTER — Encounter: Payer: Self-pay | Admitting: Hematology

## 2022-10-13 ENCOUNTER — Other Ambulatory Visit: Payer: Self-pay

## 2022-10-13 ENCOUNTER — Inpatient Hospital Stay: Payer: Medicare Other

## 2022-10-13 ENCOUNTER — Inpatient Hospital Stay: Payer: Medicare Other | Attending: Hematology | Admitting: Hematology

## 2022-10-13 VITALS — BP 155/83 | HR 83 | Temp 98.0°F | Resp 17 | Wt 155.1 lb

## 2022-10-13 DIAGNOSIS — Z170 Estrogen receptor positive status [ER+]: Secondary | ICD-10-CM

## 2022-10-13 DIAGNOSIS — Z8041 Family history of malignant neoplasm of ovary: Secondary | ICD-10-CM | POA: Insufficient documentation

## 2022-10-13 DIAGNOSIS — M8588 Other specified disorders of bone density and structure, other site: Secondary | ICD-10-CM | POA: Diagnosis not present

## 2022-10-13 DIAGNOSIS — C50412 Malignant neoplasm of upper-outer quadrant of left female breast: Secondary | ICD-10-CM | POA: Insufficient documentation

## 2022-10-13 DIAGNOSIS — D638 Anemia in other chronic diseases classified elsewhere: Secondary | ICD-10-CM | POA: Insufficient documentation

## 2022-10-13 DIAGNOSIS — Z800 Family history of malignant neoplasm of digestive organs: Secondary | ICD-10-CM | POA: Insufficient documentation

## 2022-10-13 DIAGNOSIS — D649 Anemia, unspecified: Secondary | ICD-10-CM | POA: Diagnosis not present

## 2022-10-13 DIAGNOSIS — Z803 Family history of malignant neoplasm of breast: Secondary | ICD-10-CM | POA: Diagnosis not present

## 2022-10-13 DIAGNOSIS — R232 Flushing: Secondary | ICD-10-CM | POA: Insufficient documentation

## 2022-10-13 DIAGNOSIS — Z79811 Long term (current) use of aromatase inhibitors: Secondary | ICD-10-CM | POA: Diagnosis not present

## 2022-10-13 LAB — CBC WITH DIFFERENTIAL (CANCER CENTER ONLY)
Abs Immature Granulocytes: 0.01 10*3/uL (ref 0.00–0.07)
Basophils Absolute: 0 10*3/uL (ref 0.0–0.1)
Basophils Relative: 1 %
Eosinophils Absolute: 0.1 10*3/uL (ref 0.0–0.5)
Eosinophils Relative: 3 %
HCT: 33.7 % — ABNORMAL LOW (ref 36.0–46.0)
Hemoglobin: 10.5 g/dL — ABNORMAL LOW (ref 12.0–15.0)
Immature Granulocytes: 0 %
Lymphocytes Relative: 24 %
Lymphs Abs: 0.9 10*3/uL (ref 0.7–4.0)
MCH: 26.5 pg (ref 26.0–34.0)
MCHC: 31.2 g/dL (ref 30.0–36.0)
MCV: 85.1 fL (ref 80.0–100.0)
Monocytes Absolute: 0.2 10*3/uL (ref 0.1–1.0)
Monocytes Relative: 6 %
Neutro Abs: 2.5 10*3/uL (ref 1.7–7.7)
Neutrophils Relative %: 66 %
Platelet Count: 405 10*3/uL — ABNORMAL HIGH (ref 150–400)
RBC: 3.96 MIL/uL (ref 3.87–5.11)
RDW: 15.8 % — ABNORMAL HIGH (ref 11.5–15.5)
WBC Count: 3.8 10*3/uL — ABNORMAL LOW (ref 4.0–10.5)
nRBC: 0 % (ref 0.0–0.2)

## 2022-10-13 LAB — CMP (CANCER CENTER ONLY)
ALT: 11 U/L (ref 0–44)
AST: 14 U/L — ABNORMAL LOW (ref 15–41)
Albumin: 4 g/dL (ref 3.5–5.0)
Alkaline Phosphatase: 100 U/L (ref 38–126)
Anion gap: 9 (ref 5–15)
BUN: 12 mg/dL (ref 8–23)
CO2: 26 mmol/L (ref 22–32)
Calcium: 9.9 mg/dL (ref 8.9–10.3)
Chloride: 106 mmol/L (ref 98–111)
Creatinine: 0.77 mg/dL (ref 0.44–1.00)
GFR, Estimated: >60 mL/min (ref >60)
Glucose, Bld: 88 mg/dL (ref 70–99)
Potassium: 3.4 mmol/L — ABNORMAL LOW (ref 3.5–5.1)
Sodium: 141 mmol/L (ref 135–145)
Total Bilirubin: 0.2 mg/dL — ABNORMAL LOW (ref 0.3–1.2)
Total Protein: 7.9 g/dL (ref 6.5–8.1)

## 2022-10-13 MED ORDER — GABAPENTIN 100 MG PO CAPS: 100.0000 mg | ORAL_CAPSULE | Freq: Every day | ORAL | 1 refills | Status: DC

## 2022-10-13 NOTE — Progress Notes (Signed)
[facility]   Telephone:(336) [phone] [phone]   Clinic Follow up Note   Patient Care Team: Quitman Livings, MD as PCP - General (Internal Medicine) Abigail Miyamoto, MD as Consulting Physician (General Surgery) Dorothy Puffer, MD as Consulting Physician (Radiation Oncology) Anson Fret, MD as Consulting Physician (Neurology) Axel Filler, Larna Daughters, NP as Nurse Practitioner (Hematology and Oncology) Malachy Mood, MD as Consulting Physician (Hematology)  Date of Service:  10/13/2022  CHIEF COMPLAINT: f/u of left breast cancer  CURRENT THERAPY:  Anastrozole started 10/21/2020  ASSESSMENT:  Tammy [name] is a 67 y.o. female with   Malignant neoplasm of upper-outer quadrant of left breast in female, estrogen receptor positive (HCC) -T1bN0M0, Stage IA, G1 ductal carcinoma, ER+/PR+/HER2-,  diagnosed in 06/2020 -S/P lumpectomy and sentinel lymph node biopsy in June 2022 -Oncotype recurrence score 16 -She received adjuvant radiation -She has been on adjuvant anastrozole since September 2022.  She is tolerating well, plan for 5-7 years -Patient is clinically doing well, exam unremarkable, lab reviewed, there is no clinical concern for recurrence. -Will continue cancer surveillance  Bone health -Her last DEXA scan was Jun 17, 2021 and showed osteopenia with a T score -1.9 in the AP spine.  I recommended that she repeat testing in May 2025.   Anemia of chronic disease  - she has been anemic since beginning of 2023, likely related to her left lrg wound and infection.  Previous anemia workup showed normal iron, B12 and folate in May 2024. -I encouraged her to take her prenatal vitamins for her anemia -Will repeat ferritin and retake count on next visit  Hot flushes -Secondary to anastrozole -I will call in low-dose gabapentin 100 mg at night for her, she knows not to take hydrocodone and gabapentin together  PLAN: -lab reviewed -CMP reviewed - Continue  Anastrozole, I called in low-dose gabapentin for her hot flashes at night -Mammogram schedule 10/15/2022 -lab and f/u in 6 months  SUMMARY OF ONCOLOGIC HISTORY: Oncology History Overview Note   Cancer Staging  Malignant neoplasm of upper-outer quadrant of left breast in female, estrogen receptor positive (HCC) Staging form: Breast, AJCC 8th Edition - Clinical stage from 07/10/2020: Stage IA (cT1b, cN0, cM0, G1, ER+, PR+, HER2-) - Signed by Lowella Dell, MD on 07/10/2020 Stage prefix: Initial diagnosis Histologic grading system: 3 grade system - Pathologic: Stage IA (pT1b, pN0, cM0, G1, ER+, PR+, HER2-) - Signed by Loa Socks, NP on 12/04/2020 Histologic grading system: 3 grade system     Malignant neoplasm of upper-outer quadrant of left breast in female, estrogen receptor positive (HCC)  07/02/2020 Initial Diagnosis   status post left breast upper outer quadrant biopsy 07/02/2020 for a clinical T1b N0, stage IA invasive ductal carcinoma, grade 1, estrogen and progesterone receptor positive, HER2 not amplified, with an MIB-1 of 5%   07/10/2020 Cancer Staging   Staging form: Breast, AJCC 8th Edition - Clinical stage from 07/10/2020: Stage IA (cT1b, cN0, cM0, G1, ER+, PR+, HER2-) - Signed by Lowella Dell, MD on 07/10/2020 Stage prefix: Initial diagnosis Histologic grading system: 3 grade system   07/17/2020 Genetic Testing   Negative hereditary cancer genetic testing: no pathogenic variants detected in [employer] BRCAPlus Panel or [employer] CancerNext-Expanded +RNAinsight.  The report dates are July 17, 2020 and July 22, 2020, respectively.   The BRCAplus panel offered by [employer] and includes sequencing and deletion/duplication analysis for the following 8 genes: ATM, BRCA1, BRCA2, CDH1, CHEK2, PALB2, PTEN, and TP53.  The  CancerNext-Expanded gene panel offered by [employer] and includes sequencing, rearrangement, and RNA analysis for the following 77 genes: AIP, ALK, APC, ATM,  AXIN2, BAP1, BARD1, BLM, BMPR1A, BRCA1, BRCA2, BRIP1, CDC73, CDH1, CDK4, CDKN1B, CDKN2A, CHEK2, CTNNA1, DICER1, FANCC, FH, FLCN, GALNT12, KIF1B, LZTR1, MAX, MEN1, MET, MLH1, MSH2, MSH3, MSH6, MUTYH, NBN, NF1, NF2, NTHL1, PALB2, PHOX2B, PMS2, POT1, PRKAR1A, PTCH1, PTEN, RAD51C, RAD51D, RB1, RECQL, RET, SDHA, SDHAF2, SDHB, SDHC, SDHD, SMAD4, SMARCA4, SMARCB1, SMARCE1, STK11, SUFU, TMEM127, TP53, TSC1, TSC2, VHL and XRCC2 (sequencing and deletion/duplication); EGFR, EGLN1, HOXB13, KIT, MITF, PDGFRA, POLD1, and POLE (sequencing only); EPCAM and GREM1 (deletion/duplication only).    07/26/2020 Surgery   left lumpectomy and sentinel lymph node sampling 07/26/2020 showed a pT1b pN0, stage IA invasive ductal carcinoma, grade 1, with negative margins.             (A) a total of 3 left axilla lymph nodes were removed   07/26/2020 Oncotype testing   Oncotype score of 16 predicts a risk of recurrence outside the breast within the next 9 years of 4% if the patient's only systemic therapy is an antiestrogen for 5 years.  It also predicts no benefit from chemotherapy   08/28/2020 - 09/25/2020 Radiation Therapy   adjuvant radiation 08/28/2020 through 09/25/2020 Site Technique Total Dose (Gy) Dose per Fx (Gy) Completed Fx Beam Energies  Breast, Left: Breast_Lt 3D 42.56/42.56 2.66 16/16 6XFFF, 10XFFF  Breast, Left: Breast_Lt_Bst 3D 8/8 2 4/4 6X, 10X     10/21/2020 -  Anti-estrogen oral therapy   Anastrozole    12/04/2020 Cancer Staging   Staging form: Breast, AJCC 8th Edition - Pathologic: Stage IA (pT1b, pN0, cM0, G1, ER+, PR+, HER2-) - Signed by Loa Socks, NP on 12/04/2020 Histologic grading system: 3 grade system      INTERVAL HISTORY:  Tammy [name] is here for a follow up of left breast cancer. She was last seen by NP Mardella Layman on 12/02/2021. She presents to the clinic accompanied by friend. Pt state that she is doing well with the Anastrozole, she state that she does having hot  flashes. Pt takes hydrocodone 6 hr for pain. Pt denies having any joint pain. Pt state that she does takes calcium and vitamin D.      All other systems were reviewed with the patient and are negative.  MEDICAL HISTORY:  Past Medical History:  Diagnosis Date   Anemia    hx of   Anxiety    on meds   Arthritis    back/bilateral ankles   Bipolar disorder (HCC)    Breast cancer (HCC) 2022   LEFT breast-radiation   Depression    on meds   Family history of breast cancer 07/10/2020   Family history of colon cancer 07/10/2020   Family history of ovarian cancer 07/10/2020   GERD (gastroesophageal reflux disease)    on meds   Heart murmur    dx by PCP   Hepatitis C    HTN (hypertension)    on meds   Hx of radiation therapy    Liver cirrhosis (HCC)    from hep c   Neuromuscular disorder (HCC)    Osteopenia    Stroke (HCC) 09/2012   slurred speech and LEFT sided weakness/notes 10/04/2012    SURGICAL HISTORY: Past Surgical History:  Procedure Laterality Date   ANKLE FRACTURE SURGERY Left 2018   APPLICATION OF WOUND VAC Left 06/15/2022   Procedure: APPLICATION OF WOUND VAC;  Surgeon: Peggye Form, DO;  Location: [facility];  Service: Plastics;  Laterality: Left;   APPLICATION OF WOUND VAC Left 09/14/2022   Procedure: APPLICATION OF WOUND VAC;  Surgeon: Peggye Form, DO;  Location: [facility];  Service: Plastics;  Laterality: Left;   BREAST LUMPECTOMY WITH RADIOACTIVE SEED AND SENTINEL LYMPH NODE BIOPSY Left 07/26/2020   Procedure: LEFT BREAST LUMPECTOMY WITH RADIOACTIVE SEED AND SENTINEL LYMPH NODE BIOPSY;  Surgeon: Abigail Miyamoto, MD;  Location: [facility];  Service: General;  Laterality: Left;   CESAREAN SECTION  1986   COLONOSCOPY     DIAGNOSTIC LAPAROSCOPY  1985   HARDWARE REMOVAL Left 04/30/2022   Procedure: HARDWARE REMOVAL LEFT FEMUR;  Surgeon: Samson Frederic, MD;  Location: [facility];  Service: Orthopedics;  Laterality: Left;  150   I & D KNEE WITH  POLY EXCHANGE Left 06/07/2022   Procedure: IRRIGATION AND DEBRIDEMENT KNEE WITH POLY EXCHANGE;  Surgeon: Samson Frederic, MD;  Location: [facility];  Service: Orthopedics;  Laterality: Left;   INCISION AND DRAINAGE OF WOUND Left 06/04/2022   Procedure: IRRIGATION AND DEBRIDEMENT KNEE;  Surgeon: Samson Frederic, MD;  Location: [facility];  Service: Orthopedics;  Laterality: Left;  60   INCISION AND DRAINAGE OF WOUND Left 06/15/2022   Procedure: excision of knee wound with Myriad;  Surgeon: Peggye Form, DO;  Location: [facility];  Service: Plastics;  Laterality: Left;   ORIF FEMUR FRACTURE Left 04/10/2021   Procedure: OPEN REDUCTION INTERNAL FIXATION (ORIF) DISTAL FEMUR FRACTURE;  Surgeon: Myrene Galas, MD;  Location: [facility];  Service: Orthopedics;  Laterality: Left;   PARTIAL KNEE ARTHROPLASTY Left 04/30/2022   Procedure: DISTAL FEMUR REPLACEMENT;  Surgeon: Samson Frederic, MD;  Location: [facility];  Service: Orthopedics;  Laterality: Left;  150    I have reviewed the social history and family history with the patient and they are unchanged from previous note.  ALLERGIES:  is allergic to lisinopril.  MEDICATIONS:  Current Outpatient Medications  Medication Sig Dispense Refill   gabapentin (NEURONTIN) 100 MG capsule Take 1 capsule (100 mg total) by mouth at bedtime. For hot flushes 30 capsule 1   acetaminophen (TYLENOL) 500 MG tablet Take 2 tablets (1,000 mg total) by mouth every 8 (eight) hours as needed for moderate pain, mild pain, fever or headache. 30 tablet 0   amLODipine (NORVASC) 10 MG tablet Take 1 tablet (10 mg total) by mouth daily. 30 tablet 0   amoxicillin (AMOXIL) 500 MG capsule Take 2 capsules (1,000 mg total) by mouth 3 (three) times daily. 180 capsule 11   anastrozole (ARIMIDEX) 1 MG tablet TAKE 1 TABLET BY MOUTH DAILY 90 tablet 3   Ascorbic Acid (VITAMIN C) 1000 MG tablet Take 1,000 mg by mouth daily.     buPROPion (WELLBUTRIN SR) 150 MG 12 hr tablet Take 150 mg by mouth 2 (two) times  daily.     CALCIUM PO Take 500 mg by mouth daily.     Cyanocobalamin (VITAMIN B-12 PO) Take 2,000 mcg by mouth daily.     hydrALAZINE (APRESOLINE) 25 MG tablet Take 1 tablet (25 mg total) by mouth 2 (two) times daily. 60 tablet 0   HYDROcodone-acetaminophen (NORCO) 10-325 MG tablet Take 1 tablet by mouth every 4 (four) hours as needed for severe pain or moderate pain.     meloxicam (MOBIC) 15 MG tablet Take 1 tablet (15 mg total) by mouth daily. (Patient taking differently: Take 15 mg by mouth daily as needed for pain.) 30 tablet 2   Menthol, Topical  Analgesic, (BENGAY EX) Apply 1 Application topically daily as needed (pain).     Multiple Vitamin (MULTIVITAMIN) TABS Take 1 tablet by mouth daily. 30 tablet    ondansetron (ZOFRAN) 4 MG tablet Take 1 tablet (4 mg total) by mouth every 8 (eight) hours as needed for nausea or vomiting. 30 tablet 0   pantoprazole (PROTONIX) 40 MG tablet Take 1 tablet (40 mg total) by mouth daily. 30 tablet 0   potassium chloride (KLOR-CON) 10 MEQ tablet Take 10 mEq by mouth daily.     promethazine (PHENERGAN) 12.5 MG tablet Take 1 tablet (12.5 mg total) by mouth every 8 (eight) hours as needed for nausea or vomiting. 20 tablet 1   zinc gluconate 50 MG tablet Take 50 mg by mouth daily.     No current facility-administered medications for this visit.    PHYSICAL EXAMINATION: ECOG PERFORMANCE STATUS: 1 - Symptomatic but completely ambulatory  Vitals:   10/13/22 0926  BP: (!) 155/83  Pulse: 83  Resp: 17  Temp: 98 F (36.7 C)  SpO2: 100%   Wt Readings from Last 3 Encounters:  10/13/22 155 lb 1.6 oz (70.4 kg)  09/14/22 151 lb 6.4 oz (68.7 kg)  07/09/22 168 lb (76.2 kg)    GENERAL:alert, no distress and comfortable SKIN: skin color normal, no rashes or significant lesions EYES: normal, Conjunctiva are pink and non-injected, sclera clear  NEURO: alert & oriented x 3 with fluent speech NECK: (-)supple, thyroid normal size, non-tender, without  nodularity LYMPH: (-) no palpable lymphadenopathy in the cervical, axillary   BREAST: Rt breast no palpable mass, LT BREAST mild lymphedema  no palpable mass breast exam benign.  LABORATORY DATA:  I have reviewed the data as listed    Latest Ref Rng & Units 10/13/2022    9:01 AM 09/22/2022   10:24 AM 08/25/2022   10:20 AM  CBC  WBC 4.0 - 10.5 K/uL 3.8  6.6  6.2   Hemoglobin 12.0 - 15.0 g/dL 16.1  9.0  9.4   Hematocrit 36.0 - 46.0 % 33.7  28.1  30.0   Platelets 150 - 400 K/uL 405  358  441         Latest Ref Rng & Units 10/13/2022    9:01 AM 09/22/2022   10:24 AM 08/25/2022   10:20 AM  CMP  Glucose 70 - 99 mg/dL 88  88  98   BUN 8 - 23 mg/dL 12  16  8    Creatinine 0.44 - 1.00 mg/dL 0.96  0.45  4.09   Sodium 135 - 145 mmol/L 141  144  141   Potassium 3.5 - 5.1 mmol/L 3.4  3.8  3.6   Chloride 98 - 111 mmol/L 106  110  104   CO2 22 - 32 mmol/L 26  23  26    Calcium 8.9 - 10.3 mg/dL 9.9  9.0  8.8   Total Protein 6.5 - 8.1 g/dL 7.9  6.8  6.4   Total Bilirubin 0.3 - 1.2 mg/dL 0.2  0.2  0.2   Alkaline Phos 38 - 126 U/L 100     AST 15 - 41 U/L 14  20  21    ALT 0 - 44 U/L 11  12  13        RADIOGRAPHIC STUDIES: I have personally reviewed the radiological images as listed and agreed with the findings in the report. No results found.    Orders Placed This Encounter  Procedures   Ferritin    Standing  Status:   Standing    Number of Occurrences:   20    Standing Expiration Date:   10/13/2023   Retic Panel    Standing Status:   Future    Standing Expiration Date:   10/13/2023   All questions were answered. The patient knows to call the clinic with any problems, questions or concerns. No barriers to learning was detected. The total time spent in the appointment was 30 minutes.     Malachy Mood, MD 10/13/2022   Carolin Coy, CMA, am acting as scribe for Malachy Mood, MD.   I have reviewed the above documentation for accuracy and completeness, and I agree with the above.

## 2022-10-15 ENCOUNTER — Other Ambulatory Visit: Payer: Self-pay | Admitting: Adult Health

## 2022-10-15 ENCOUNTER — Ambulatory Visit
Admission: RE | Admit: 2022-10-15 | Discharge: 2022-10-15 | Disposition: A | Discharge status: Home / Self Care | Payer: Medicare Other | Source: Ambulatory Visit | Attending: Adult Health | Admitting: Adult Health

## 2022-10-15 DIAGNOSIS — R928 Other abnormal and inconclusive findings on diagnostic imaging of breast: Secondary | ICD-10-CM

## 2022-10-15 HISTORY — DX: Personal history of irradiation: Z92.3

## 2022-10-16 ENCOUNTER — Encounter: Payer: Self-pay | Admitting: Plastic Surgery

## 2022-10-16 ENCOUNTER — Ambulatory Visit (INDEPENDENT_AMBULATORY_CARE_PROVIDER_SITE_OTHER): Payer: Medicare Other | Admitting: Plastic Surgery

## 2022-10-16 VITALS — BP 113/77 | HR 83

## 2022-10-16 DIAGNOSIS — T8131XA Disruption of external operation (surgical) wound, not elsewhere classified, initial encounter: Secondary | ICD-10-CM | POA: Diagnosis not present

## 2022-10-16 NOTE — Progress Notes (Signed)
   Subjective:    Patient ID: Tammy [name], female    DOB: [birth year], 67 y.o.   MRN: [id number]  HPI The patient is a 67 year old female here for follow-up on her left leg knee wound.  She is doing really well.  The wound has done an amazing job but the superior area of about a centimeter is not healing at the deep part.  This is where she has hardware exposed.  She has an appointment with Dr. Lajoyce [name] to for next Tuesday and we will plan to see [place] later in the week.  Review of Systems  Constitutional: Negative.   HENT: Negative.    Eyes: Negative.   Respiratory: Negative.    Cardiovascular: Negative.   Gastrointestinal: Negative.   Endocrine: Negative.   Genitourinary: Negative.        Objective:   Physical Exam Constitutional:      Appearance: Normal appearance.  Cardiovascular:     Rate and Rhythm: Normal rate.     Pulses: Normal pulses.  Musculoskeletal:        General: Tenderness present.  Skin:    General: Skin is warm.     Capillary Refill: Capillary refill takes less than 2 seconds.     Findings: Lesion present.  Neurological:     Mental Status: She is alert and oriented to person, place, and time.  Psychiatric:        Mood and Affect: Mood normal.        Behavior: Behavior normal.        Thought Content: Thought content normal.        Judgment: Judgment normal.         Assessment & Plan:     ICD-10-CM   1. Surgical wound dehiscence, initial encounter  T81.31XA       We will plan to see the patient after she sees Dr. Lajoyce [name].  I have put a fluid absorbing dressing on the patient today.  She can remove it as soon as it gets saturated and go to Xeroform or Vaseline.  Working to give her a 1 week holiday on the [name].

## 2022-10-20 ENCOUNTER — Encounter: Payer: Self-pay | Admitting: Orthopedic Surgery

## 2022-10-20 ENCOUNTER — Ambulatory Visit: Payer: Medicare Other | Admitting: Orthopedic Surgery

## 2022-10-20 DIAGNOSIS — M86452 Chronic osteomyelitis with draining sinus, left femur: Secondary | ICD-10-CM

## 2022-10-20 DIAGNOSIS — T847XXA Infection and inflammatory reaction due to other internal orthopedic prosthetic devices, implants and grafts, initial encounter: Secondary | ICD-10-CM

## 2022-10-20 NOTE — Progress Notes (Addendum)
Office Visit Note   Patient: Tammy [name]           Date of Birth: [birth year]           MRN: [id number] Visit Date: 10/20/2022              Requested by: Quitman Livings, MD [address], [address] [place],  [place] [address] PCP: Quitman Livings, MD  Chief Complaint  Patient presents with   Left Leg - Wound Check      HPI: Patient is a 67 year old woman who is seen for initial evaluation for infected distal femur replacement total knee arthroplasty with chronic sinus draining tract.  Patient underwent open reduction of her distal femur fracture April 10, 2021.  Patient eventually had the hardware removed in March 2024 with irrigation and debridement with Dr. [name] 3 separate times as well as distal femur replacement with total knee arthroplasty.  Patient has undergone 2 wound debridements and application of myriad with Dr. Ulice Bold on May 6 and August 5.  She is currently on oral antibiotics.  Negative history for tobacco use negative history for diabetes.  Assessment & Plan: Visit Diagnoses:  1. 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  Risks and benefits were discussed including a staged procedure for removal of the hardware and possible staged procedure for debridement of the soft tissue envelope.  Patient states she understands wished to proceed at this time.  Patient will need discharge to inpatient versus outpatient rehab.  Follow-Up Instructions: No follow-ups on file.   Ortho Exam  Patient is alert, oriented, no adenopathy, well-dressed, normal affect, normal respiratory effort. Examination patient has a large wound over the patella.  There is a sinus tract  that extends 6 cm proximal to the hardware and bone.  There is bubbling clear drainage.  Patient has essentially no range of motion of the knee.  There is swelling and cellulitis of the left lower extremity.  Patient has a triphasic dorsalis pedis pulse by Doppler.   Imaging: No results found.   Labs: Lab Results  Component Value Date   HGBA1C 5.6 03/17/2022   HGBA1C 5.6 12/01/2018   HGBA1C 5.9 (H) 05/11/2015   ESRSEDRATE 38 (H) 03/17/2022   CRP 68.5 (H) 09/22/2022   CRP 47.9 (H) 08/25/2022   CRP <0.5 03/17/2022   REPTSTATUS 06/12/2022 FINAL 06/07/2022   GRAMSTAIN  06/07/2022    MODERATE WBC PRESENT, PREDOMINANTLY PMN NO ORGANISMS SEEN    CULT  06/07/2022    RARE ENTEROCOCCUS FAECALIS NO ANAEROBES ISOLATED Performed at [facility], [address] [place], [place] [address]    [facility] ENTEROCOCCUS FAECALIS 06/07/2022     Lab Results  Component Value Date   ALBUMIN 4.0 10/13/2022   ALBUMIN 3.0 (L) 06/16/2022   ALBUMIN 3.1 (L) 06/15/2022    Lab Results  Component Value Date   MG 2.0 06/16/2022   MG 2.4 06/15/2022   MG 2.1 06/13/2022   Lab Results  Component Value Date   VD25OH 10.88 (L) 04/11/2021    No results found for: "PREALBUMIN"    Latest Ref Rng & Units 10/13/2022    9:01 AM 09/22/2022   10:24 AM 08/25/2022  10:20 AM  CBC EXTENDED  WBC 4.0 - 10.5 K/uL 3.8  6.6  6.2   RBC 3.87 - 5.11 MIL/uL 3.96  3.30  3.46   Hemoglobin 12.0 - 15.0 g/dL 45.4  9.0  9.4   HCT 09.8 - 46.0 % 33.7  28.1  30.0   Platelets 150 - 400 K/uL 405  358  441   NEUT# 1.7 - 7.7 K/uL 2.5     Lymph# 0.7 - 4.0 K/uL 0.9        There is no height or weight on file to calculate BMI.  Orders:  No orders of the defined types were placed in this encounter.  No orders of the defined types were placed in this encounter.    Procedures: No procedures performed  Clinical Data: No additional findings.  ROS:  All other systems negative, except as noted in the HPI. Review  of Systems  Objective: Vital Signs: There were no vitals taken for this visit.  Specialty Comments:  No specialty comments available.  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, left, with nonunion, subsequent encounter 04/30/2022   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 04/30/2022   Hypokalemia 04/16/2021   Hip fracture requiring operative repair (HCC) 04/16/2021   Vitamin D deficiency 04/12/2021   Hyponatremia 04/11/2021   Acute postoperative anemia due to expected blood loss 04/11/2021   Class 1 obesity 04/11/2021   Left comminuted and displaced fracture of the distal femur metaphysis 04/08/2021   History of CVA (cerebrovascular accident) 04/08/2021   Genetic testing 07/17/2020   Family history of breast cancer 07/10/2020   Family history of ovarian cancer 07/10/2020   Family history of colon cancer 07/10/2020   Malignant neoplasm of upper-outer quadrant of left breast in female, estrogen receptor positive (HCC) 07/09/2020   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   Chronic pain syndrome 10/06/2012   Nicotine dependence 10/06/2012   Anxiety state, unspecified 10/05/2012   Bipolar disorder, unspecified (HCC) 10/05/2012   Slurred speech 10/04/2012   Left-sided weakness 10/04/2012   Hyperkalemia 10/04/2012   Acute CVA (cerebrovascular accident) (HCC) 10/04/2012   Hypertension    Past Medical History:  Diagnosis Date   Anemia    hx of   Anxiety    on meds   Arthritis    back/bilateral ankles   Bipolar disorder (HCC)    Breast cancer (HCC) 2022   LEFT breast-radiation   Depression    on meds   Family history of breast cancer 07/10/2020   Family history of colon cancer 07/10/2020   Family history of ovarian cancer  07/10/2020   GERD (gastroesophageal reflux disease)    on meds   Heart murmur    dx by PCP   Hepatitis C    HTN (hypertension)    on meds   Hx of radiation therapy    Liver cirrhosis (HCC)    from hep c   Neuromuscular disorder (HCC)    Osteopenia    Personal history of radiation therapy    Stroke (HCC) 09/2012   slurred speech and LEFT sided weakness/notes 10/04/2012    Family History  Problem Relation Age of Onset   Breast cancer Mother    Other Mother        blood clots   Diabetes Sister  Neuropathy Sister        diabetic neuropathy   Breast cancer Maternal Aunt        50   Colon cancer Maternal Aunt 69   Diabetes Paternal Aunt    Diabetes Brother    Neuropathy Brother        diabetic neuropathy   Breast cancer Other        PGM's sister; dx mid 45s   Colon cancer Other        PGM's brother; dx 3s   Ovarian cancer Other        PGM's mother; dx unknown age   Esophageal cancer Neg Hx    Rectal cancer Neg Hx    Stomach cancer Neg Hx    Colon polyps Neg Hx     Past Surgical History:  Procedure Laterality Date   ANKLE FRACTURE SURGERY Left 2018   APPLICATION OF WOUND VAC Left 06/15/2022   Procedure: APPLICATION OF WOUND VAC;  Surgeon: Peggye Form, DO;  Location: [facility];  Service: Plastics;  Laterality: Left;   APPLICATION OF WOUND VAC Left 09/14/2022   Procedure: APPLICATION OF WOUND VAC;  Surgeon: Peggye Form, DO;  Location: [facility];  Service: Plastics;  Laterality: Left;   BREAST LUMPECTOMY Left 07/2020   BREAST LUMPECTOMY WITH RADIOACTIVE SEED AND SENTINEL LYMPH NODE BIOPSY Left 07/26/2020   Procedure: LEFT BREAST LUMPECTOMY WITH RADIOACTIVE SEED AND SENTINEL LYMPH NODE BIOPSY;  Surgeon: Abigail Miyamoto, MD;  Location: [facility];  Service: General;  Laterality: Left;   CESAREAN SECTION  1986   COLONOSCOPY     DIAGNOSTIC LAPAROSCOPY  1985   HARDWARE REMOVAL Left 04/30/2022   Procedure: HARDWARE REMOVAL LEFT FEMUR;  Surgeon:  Samson Frederic, MD;  Location: [facility];  Service: Orthopedics;  Laterality: Left;  150   I & D KNEE WITH POLY EXCHANGE Left 06/07/2022   Procedure: IRRIGATION AND DEBRIDEMENT KNEE WITH POLY EXCHANGE;  Surgeon: Samson Frederic, MD;  Location: [facility];  Service: Orthopedics;  Laterality: Left;   INCISION AND DRAINAGE OF WOUND Left 06/04/2022   Procedure: IRRIGATION AND DEBRIDEMENT KNEE;  Surgeon: Samson Frederic, MD;  Location: [facility];  Service: Orthopedics;  Laterality: Left;  60   INCISION AND DRAINAGE OF WOUND Left 06/15/2022   Procedure: excision of knee wound with Myriad;  Surgeon: Peggye Form, DO;  Location: [facility];  Service: Plastics;  Laterality: Left;   ORIF FEMUR FRACTURE Left 04/10/2021   Procedure: OPEN REDUCTION INTERNAL FIXATION (ORIF) DISTAL FEMUR FRACTURE;  Surgeon: Myrene Galas, MD;  Location: [facility];  Service: Orthopedics;  Laterality: Left;   PARTIAL KNEE ARTHROPLASTY Left 04/30/2022   Procedure: DISTAL FEMUR REPLACEMENT;  Surgeon: Samson Frederic, MD;  Location: [facility];  Service: Orthopedics;  Laterality: Left;  150   Social History   Occupational History   Occupation: diabled  Tobacco Use   Smoking status: Former    Current packs/day: 1.50    Average packs/day: 1.5 packs/day for 40.0 years (60.0 ttl pk-yrs)    Types: Cigarettes   Smokeless tobacco: Never   Tobacco comments:    has used e-cigarettes, no vaping   Vaping Use   Vaping status: Never Used  Substance and Sexual Activity   Alcohol use: Not Currently   Drug use: No   Sexual activity: Not Currently

## 2022-10-21 ENCOUNTER — Ambulatory Visit (INDEPENDENT_AMBULATORY_CARE_PROVIDER_SITE_OTHER): Payer: Medicare Other | Admitting: Physician Assistant

## 2022-10-21 DIAGNOSIS — S81002D Unspecified open wound, left knee, subsequent encounter: Secondary | ICD-10-CM

## 2022-10-21 NOTE — Progress Notes (Signed)
Referring Provider Quitman Livings, MD [address] [place],  [place] [address]   CC:  Chief Complaint  Patient presents with   Follow-up      Tammy [name] is an 67 y.o. female.  HPI: Patient is a pleasant 67 year old female with PMH of left knee wound s/p myriad and wound VAC placement performed by Dr. Ulice Bold 06/15/2022 and again most recently on 09/14/2022 who returns to clinic for follow-up.  Unfortunately she has obvious hardware exposure, visualized through a 1 x 1 cm wound.  Remainder of her approximately 9 x 4.5 cm wound had excellent granulation tissue with advanced epithelialization.  She has been seeing her clinic for wound care dressings, but was informed that she would need to see orthopedics for definitive management.  Will remain available to support as needed.  She then was seen by Dr. [name] yesterday, 10/20/2022.  At that time, discussed AKA given the chronic sinus draining tract that extends to the bone and hardware insetting of failure of multiple debridements and wound matrix placement.  Discussed staged procedure for the patient.  She understands and would like to proceed.  Today, patient is companied by husband at bedside.  She confirms that she had spoken with Dr. [name] about AKA and is agreeable to proceed.  She suspects that she will be having surgery in the next 2 weeks.  Since her wound VAC removal last week, she states that she has had increased discomfort in the left knee as well as increased drainage.  She is hopeful that can be replaced today for comfort purposes until she has definitive surgical management with Dr. [name].  She denies any fevers, severe pain, difficulty ambulating, or any other symptoms.   Allergies  Allergen Reactions   Lisinopril Cough    Outpatient Encounter Medications as of 10/21/2022  Medication Sig   acetaminophen (TYLENOL) 500 MG tablet Take 2 tablets (1,000 mg total) by mouth every 8 (eight) hours as needed for moderate  pain, mild pain, fever or headache.   amLODipine (NORVASC) 10 MG tablet Take 1 tablet (10 mg total) by mouth daily.   amoxicillin (AMOXIL) 500 MG capsule Take 2 capsules (1,000 mg total) by mouth 3 (three) times daily.   anastrozole (ARIMIDEX) 1 MG tablet TAKE 1 TABLET BY MOUTH DAILY   Ascorbic Acid (VITAMIN C) 1000 MG tablet Take 1,000 mg by mouth daily.   buPROPion (WELLBUTRIN SR) 150 MG 12 hr tablet Take 150 mg by mouth 2 (two) times daily.   CALCIUM PO Take 500 mg by mouth daily.   Cyanocobalamin (VITAMIN B-12 PO) Take 2,000 mcg by mouth daily.   gabapentin (NEURONTIN) 100 MG capsule Take 1 capsule (100 mg total) by mouth at bedtime. For hot flushes   hydrALAZINE (APRESOLINE) 25 MG tablet Take 1 tablet (25 mg total) by mouth 2 (two) times daily.   HYDROcodone-acetaminophen (NORCO) 10-325 MG tablet Take 1 tablet by mouth every 4 (four) hours as needed for severe pain or moderate pain.   meloxicam (MOBIC) 15 MG tablet Take 1 tablet (15 mg total) by mouth daily. (Patient taking differently: Take 15 mg by mouth daily as needed for pain.)   Menthol, Topical Analgesic, (BENGAY EX) Apply 1 Application topically daily as needed (pain).   Multiple Vitamin (MULTIVITAMIN) TABS Take 1 tablet by mouth daily.   ondansetron (ZOFRAN) 4 MG tablet Take 1 tablet (4 mg total) by mouth every 8 (eight) hours as needed for nausea or vomiting.   pantoprazole (PROTONIX) 40  MG tablet Take 1 tablet (40 mg total) by mouth daily.   potassium chloride (KLOR-CON) 10 MEQ tablet Take 10 mEq by mouth daily.   promethazine (PHENERGAN) 12.5 MG tablet Take 1 tablet (12.5 mg total) by mouth every 8 (eight) hours as needed for nausea or vomiting.   zinc gluconate 50 MG tablet Take 50 mg by mouth daily.   No facility-administered encounter medications on file as of 10/21/2022.     Past Medical History:  Diagnosis Date   Anemia    hx of   Anxiety    on meds   Arthritis    back/bilateral ankles   Bipolar disorder (HCC)     Breast cancer (HCC) 2022   LEFT breast-radiation   Depression    on meds   Family history of breast cancer 07/10/2020   Family history of colon cancer 07/10/2020   Family history of ovarian cancer 07/10/2020   GERD (gastroesophageal reflux disease)    on meds   Heart murmur    dx by PCP   Hepatitis C    HTN (hypertension)    on meds   Hx of radiation therapy    Liver cirrhosis (HCC)    from hep c   Neuromuscular disorder (HCC)    Osteopenia    Personal history of radiation therapy    Stroke (HCC) 09/2012   slurred speech and LEFT sided weakness/notes 10/04/2012    Past Surgical History:  Procedure Laterality Date   ANKLE FRACTURE SURGERY Left 2018   APPLICATION OF WOUND VAC Left 06/15/2022   Procedure: APPLICATION OF WOUND VAC;  Surgeon: Peggye Form, DO;  Location: [facility];  Service: Plastics;  Laterality: Left;   APPLICATION OF WOUND VAC Left 09/14/2022   Procedure: APPLICATION OF WOUND VAC;  Surgeon: Peggye Form, DO;  Location: [facility];  Service: Plastics;  Laterality: Left;   BREAST LUMPECTOMY Left 07/2020   BREAST LUMPECTOMY WITH RADIOACTIVE SEED AND SENTINEL LYMPH NODE BIOPSY Left 07/26/2020   Procedure: LEFT BREAST LUMPECTOMY WITH RADIOACTIVE SEED AND SENTINEL LYMPH NODE BIOPSY;  Surgeon: Abigail Miyamoto, MD;  Location: [facility];  Service: General;  Laterality: Left;   CESAREAN SECTION  1986   COLONOSCOPY     DIAGNOSTIC LAPAROSCOPY  1985   HARDWARE REMOVAL Left 04/30/2022   Procedure: HARDWARE REMOVAL LEFT FEMUR;  Surgeon: Samson Frederic, MD;  [facility]: [facility];  Service: Orthopedics;  Laterality: Left;  150   I & D KNEE WITH POLY EXCHANGE Left 06/07/2022   Procedure: IRRIGATION AND DEBRIDEMENT KNEE WITH POLY EXCHANGE;  Surgeon: Samson Frederic, MD;  [facility]: [facility];  Service: Orthopedics;  Laterality: Left;   INCISION AND DRAINAGE OF WOUND Left 06/04/2022   Procedure: IRRIGATION AND DEBRIDEMENT KNEE;  Surgeon: Samson Frederic, MD;   Location: [facility];  Service: Orthopedics;  Laterality: Left;  60   INCISION AND DRAINAGE OF WOUND Left 06/15/2022   Procedure: excision of knee wound with Myriad;  Surgeon: Peggye Form, DO;  Location: [facility];  Service: Plastics;  Laterality: Left;   ORIF FEMUR FRACTURE Left 04/10/2021   Procedure: OPEN REDUCTION INTERNAL FIXATION (ORIF) DISTAL FEMUR FRACTURE;  Surgeon: Myrene Galas, MD;  Location: [facility];  Service: Orthopedics;  Laterality: Left;   PARTIAL KNEE ARTHROPLASTY Left 04/30/2022   Procedure: DISTAL FEMUR REPLACEMENT;  Surgeon: Samson Frederic, MD;  Location: [facility];  Service: Orthopedics;  Laterality: Left;  150    Family History  Problem Relation Age of Onset   Breast  cancer Mother    Other Mother        blood clots   Diabetes Sister    Neuropathy Sister        diabetic neuropathy   Breast cancer Maternal Aunt        21   Colon cancer Maternal Aunt 33   Diabetes Paternal Aunt    Diabetes Brother    Neuropathy Brother        diabetic neuropathy   Breast cancer Other        PGM's sister; dx mid 41s   Colon cancer Other        PGM's brother; dx 38s   Ovarian cancer Other        PGM's mother; dx unknown age   Esophageal cancer Neg Hx    Rectal cancer Neg Hx    Stomach cancer Neg Hx    Colon polyps Neg Hx     Social History   Social History Narrative   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   Caffeine: 1 cup in the morning      Review of Systems General: Denies fevers or chills Skin: Endorses increased drainage  Physical Exam    10/16/2022    1:31 PM 10/13/2022    9:26 AM 10/02/2022    9:47 AM  Vitals with BMI  Weight  155 lbs 2 oz   Systolic 113 155 376  Diastolic 77 83 98  Pulse 83 83 79    General:  No acute distress, nontoxic appearing  Respiratory: No increased work of breathing Neuro: Alert and oriented Psychiatric: Normal mood and affect  Right knee: 1 x 1 cm wound with hardware visualization and moderate amount of  yellowish serous drainage.  No overt purulence or significant malodor.  Surrounding skin and tissue appears healthy.  No cellulitic changes.  Assessment/Plan  Chronic left knee wound:  Replaced wound VAC for comfort purposes.  She is planning AKA with Dr. Due to in the next couple of weeks.  Will defer ongoing management to orthopedics.  She can certainly call the office should she have any questions or concerns in the interim.  All questions answered.  Patient was appreciative of the care provided.  Picture(s) obtained of the patient and placed in the chart were with the patient's or guardian's permission.   Evelena Leyden PA-C 10/21/2022, 10:52 AM

## 2022-11-03 ENCOUNTER — Encounter (HOSPITAL_COMMUNITY): Payer: Self-pay | Admitting: Orthopedic Surgery

## 2022-11-03 ENCOUNTER — Other Ambulatory Visit: Payer: Self-pay

## 2022-11-03 ENCOUNTER — Encounter: Payer: Self-pay | Admitting: Internal Medicine

## 2022-11-03 ENCOUNTER — Ambulatory Visit: Payer: Medicare Other | Admitting: Internal Medicine

## 2022-11-03 VITALS — BP 143/86 | HR 70 | Temp 97.1°F | Resp 16

## 2022-11-03 DIAGNOSIS — T847XXD Infection and inflammatory reaction due to other internal orthopedic prosthetic devices, implants and grafts, subsequent encounter: Secondary | ICD-10-CM

## 2022-11-03 DIAGNOSIS — M869 Osteomyelitis, unspecified: Secondary | ICD-10-CM | POA: Diagnosis not present

## 2022-11-03 NOTE — Patient Instructions (Signed)
Please let dr [name] know to send tissue for cultures; I wonder if something else had established infection there besides the enterococcus faecalis   Dr Drue Second will see you in the hospital and help adjust antibiotics depending on what the culture shows   See me around 3 weeks after hospital discharge

## 2022-11-03 NOTE — Progress Notes (Signed)
S.D.W- Instructions   Your procedure is scheduled on Wed., Sept. 25, 2024 from 9:36AM-10:46AM.  Report to [facility] Main Entrance "A" at 7:00 A.M., then check in with the Admitting office.  Call this number if you have problems the morning of surgery:  [phone]             If you experience any cold or flu symptoms such as cough, fever, chills, shortness of breath, etc. between now and your scheduled surgery, please notify us at the above         number.   Remember:  Do not eat after midnight the night before your surgery  You may drink clear liquids until 3 hours (6:30AM) prior to surgery time, the morning of your surgery.   Clear liquids allowed are: Water, Non-Citrus Juices (without pulp), Carbonated Beverages, Clear Tea, Black Coffee ONLY (NO MILK, CREAM OR POWDERED CREAMER of any kind), and Gatorade    Take these medicines the morning of surgery with A SIP OF WATER:  AmLODipine (NORVASC)  BuPROPion (WELLBUTRIN SR)  HydrALAZINE (APRESOLINE)  Pantoprazole (PROTONIX)   If Needed: HYDROcodone-acetaminophen (NORCO)   As of today, STOP taking any Aspirin (unless otherwise instructed by your surgeon) Aleve, Naproxen, Ibuprofen, Motrin, Advil, Goody's, BC's, all herbal medications, fish oil, and all vitamins.          Do not wear jewelry or makeup. Do not wear lotions, powders, perfumes or deodorant. Do not shave 48 hours prior to surgery.  Do not bring valuables to the hospital. Do not wear nail polish, gel polish, artificial nails, or any other type of covering on natural nails (fingers and toes) If you have artificial nails or gel coating that need to be removed by a nail salon, please have this removed prior to surgery. Artificial nails or gel coating may interfere with anesthesia's ability to adequately monitor your vital signs.  [facility] is not responsible for any belongings or valuables.    Do NOT Smoke (Tobacco/Vaping)  24 hours prior to your procedure  If you use a  CPAP at night, you may bring your mask for your overnight stay.   Contacts, glasses, hearing aids, dentures or partials may not be worn into surgery, please bring cases for these belongings   For patients admitted to the hospital, discharge time will be determined by your treatment team.   Patients discharged the day of surgery will not be allowed to drive home, and someone needs to stay with them for 24 hours.  Special instructions:    Oral Hygiene is also important to reduce your risk of infection.  Remember - BRUSH YOUR TEETH THE MORNING OF SURGERY WITH YOUR REGULAR TOOTHPASTE  [facility]- Preparing For Surgery  Before surgery, you can play an important role. Because skin is not sterile, your skin needs to be as free of germs as possible. You can reduce the number of germs on your skin by washing with Antibacterial Soap before surgery.     Please follow these instructions carefully.     Shower the NIGHT BEFORE SURGERY and the MORNING OF SURGERY with Antibacterial Soap.   Pat yourself dry with a CLEAN TOWEL.  Wear CLEAN PAJAMAS to bed the night before surgery  Place CLEAN SHEETS on your bed the night before your surgery  DO NOT SLEEP WITH PETS.  Day of Surgery:  Take a shower with Antibacterial soap. Wear Clean/Comfortable clothing the morning of surgery Do not apply any deodorants/lotions.   Remember to brush your  teeth WITH YOUR REGULAR TOOTHPASTE.   If you test positive for Covid, or been in contact with anyone that has tested positive in the last 10 days, please notify your surgeon.  SURGICAL WAITING ROOM VISITATION Patients having surgery or a procedure may have no more than 2 support people in the waiting area - these visitors may rotate.   Children under the age of 47 must have an adult with them who is not the patient. If the patient needs to stay at the hospital during part of their recovery, the visitor guidelines for inpatient rooms apply. Pre-op nurse will  coordinate an appropriate time for 1 support person to accompany patient in pre-op.  This support person may not rotate.   Please refer to the [facility] website for the visitor guidelines for Inpatients (after your surgery is over and you are in a regular room).

## 2022-11-03 NOTE — Progress Notes (Signed)
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 04/30/2022   Hypokalemia 04/16/2021   Hip fracture requiring operative repair (HCC) 04/16/2021   Vitamin D deficiency 04/12/2021   Hyponatremia 04/11/2021   Acute postoperative anemia due to expected blood loss 04/11/2021   Class 1 obesity 04/11/2021   Left comminuted and displaced fracture of the distal femur metaphysis 04/08/2021   History of CVA (cerebrovascular accident) 04/08/2021   Genetic testing 07/17/2020   Family history of breast cancer 07/10/2020   Family history of ovarian cancer 07/10/2020   Family history of colon cancer 07/10/2020   Malignant neoplasm of upper-outer quadrant of left breast in female, estrogen receptor positive (HCC) 07/09/2020   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   Chronic pain syndrome 10/06/2012   Nicotine dependence 10/06/2012   Anxiety state, unspecified 10/05/2012   Bipolar disorder, unspecified (HCC) 10/05/2012   Slurred speech 10/04/2012   Left-sided weakness 10/04/2012   Hyperkalemia 10/04/2012   Acute CVA (cerebrovascular accident) (HCC) 10/04/2012   Hypertension       Subjective:    Patient ID: Tammy [name], female    DOB: 1956/02/01, 67 y.o.   MRN: [id number]  No chief complaint on file.   HPI:  Tammy [name] is a 67 y.o. 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 Likely will need long term antibiotics suppression She was discharged 2-3 weeks ago; s/p I&D in hospital and cx e faecalis Discharged on ampicillin  Diarrhea on unasyn but not amp 6 weeks iv planned till 07/19/22 then indefinite suppression   Still have wound vac and was changed twice last 2-3 weeks by her surgeon's office Minimal pain if at all; no purulence out of vac. Granulating tissue in wound   ---------- 08/25/22 id clinic f/u She should have finished iv abx on 07/19/22 and then go on amoxicillin for indefinite suppression in setting hardware in the infected site (partial hardware removal and new hardware placement at times of I&D) She takes amoxicillin as rx'ed 3 times a day 1 gram Some nausea with meds No diarrhea  Some purulence and wound break down and pain No fever chill  See surgery today to see if more surgery needed  Asking for phenergan refill  Bp high    11/03/22 id clinic f/u See below a&P     Allergies  Allergen Reactions   Lisinopril Cough      Outpatient Medications Prior to Visit  Medication Sig Dispense Refill   acetaminophen (TYLENOL) 500 MG tablet Take 2 tablets (1,000 mg total) by mouth every 8 (eight) hours as needed for moderate pain, mild pain, fever or headache. 30 tablet 0  amLODipine (NORVASC) 10 MG tablet Take 1 tablet (10 mg total) by mouth daily. 30 tablet 0   amoxicillin (AMOXIL) 500 MG capsule Take 2 capsules (1,000 mg total) by mouth 3 (three) times daily. 180 capsule 11   anastrozole (ARIMIDEX) 1 MG tablet TAKE 1 TABLET BY MOUTH DAILY 90 tablet 3   Ascorbic Acid (VITAMIN C) 1000 MG tablet Take 1,000  mg by mouth daily.     buPROPion (WELLBUTRIN SR) 150 MG 12 hr tablet Take 150 mg by mouth 2 (two) times daily.     Cyanocobalamin (VITAMIN B-12 PO) Take 2,000 mcg by mouth daily.     gabapentin (NEURONTIN) 100 MG capsule Take 1 capsule (100 mg total) by mouth at bedtime. For hot flushes 30 capsule 1   hydrALAZINE (APRESOLINE) 25 MG tablet Take 1 tablet (25 mg total) by mouth 2 (two) times daily. 60 tablet 0   HYDROcodone-acetaminophen (NORCO) 10-325 MG tablet Take 1 tablet by mouth every 4 (four) hours as needed for severe pain or moderate pain.     meloxicam (MOBIC) 15 MG tablet Take 1 tablet (15 mg total) by mouth daily. (Patient taking differently: Take 15 mg by mouth daily as needed for pain.) 30 tablet 2   Menthol, Topical Analgesic, (BENGAY EX) Apply 1 Application topically daily as needed (pain).     pantoprazole (PROTONIX) 40 MG tablet Take 1 tablet (40 mg total) by mouth daily. 30 tablet 0   potassium chloride (KLOR-CON) 10 MEQ tablet Take 10 mEq by mouth daily.     promethazine (PHENERGAN) 12.5 MG tablet Take 1 tablet (12.5 mg total) by mouth every 8 (eight) hours as needed for nausea or vomiting. 20 tablet 1   zinc gluconate 50 MG tablet Take 50 mg by mouth daily.     No facility-administered medications prior to visit.     Social History   Socioeconomic History   Marital status: Significant Other    Spouse name: Not on file   Number of children: 2   Years of education: Not on file   Highest education level: Not on file  Occupational History   Occupation: diabled  Tobacco Use   Smoking status: Former    Current packs/day: 1.50    Average packs/day: 1.5 packs/day for 40.0 years (60.0 ttl pk-yrs)    Types: Cigarettes   Smokeless tobacco: Never   Tobacco comments:    has used e-cigarettes, no vaping   Vaping Use   Vaping status: Never Used  Substance and Sexual Activity   Alcohol use: Not Currently   Drug use: No   Sexual activity: Not Currently  Other Topics Concern    Not on file  Social History Narrative   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   Caffeine: 1 cup in the morning    Social Determinants of Health   Financial Resource Strain: Not on file  Food Insecurity: No Food Insecurity (06/04/2022)   Hunger Vital Sign    Worried About Running Out of Food in the Last Year: Never true    Ran Out of Food in the Last Year: Never true  Transportation Needs: No Transportation Needs (06/04/2022)   PRAPARE - Administrator, Civil Service (Medical): No    Lack of Transportation (Non-Medical): No  Physical Activity: Not on file  Stress: Not on file  Social Connections: Not on file  Intimate Partner Violence: Not At Risk (06/04/2022)   Humiliation, Afraid, Rape, and Kick questionnaire  Fear of Current or Ex-Partner: No    Emotionally Abused: No    Physically Abused: No    Sexually Abused: No      Review of Systems    All other ros negative   Objective:    BP (!) 143/86   Pulse 70   Temp (!) 97.1 F (36.2 C) (Temporal)   Resp 16   SpO2 100%  Nursing note and vital signs reviewed.  Physical Exam     General/constitutional: no distress, pleasant HEENT: Normocephalic, PER, Conj Clear, EOMI, Oropharynx clear Neck supple CV: rrr no mrg Lungs: clear to auscultation, normal respiratory effort Abd: Soft, Nontender Ext: no edema Skin: No Rash Neuro: nonfocal MSK: left knee in dressing -- wound vac chamber murky and purulent      Labs: Lab Results  Component Value Date   WBC 3.8 (L) 10/13/2022   HGB 10.5 (L) 10/13/2022   HCT 33.7 (L) 10/13/2022   MCV 85.1 10/13/2022   PLT 405 (H) 10/13/2022   Last metabolic panel Lab Results  Component Value Date   GLUCOSE 88 10/13/2022   NA 141 10/13/2022   K 3.4 (L) 10/13/2022   CL 106 10/13/2022   CO2 26 10/13/2022   BUN 12 10/13/2022   CREATININE 0.77 10/13/2022   GFRNONAA >60 10/13/2022   CALCIUM 9.9 10/13/2022   PHOS 3.9 06/15/2022   PROT 7.9  10/13/2022   ALBUMIN 4.0 10/13/2022   LABGLOB 3.3 12/01/2018   BILITOT 0.2 (L) 10/13/2022   ALKPHOS 100 10/13/2022   AST 14 (L) 10/13/2022   ALT 11 10/13/2022   ANIONGAP 9 10/13/2022    Micro:  Serology:  Imaging:  Assessment & Plan:   Problem List Items Addressed This Visit   None Visit Diagnoses     Osteomyelitis, unspecified site, unspecified type (HCC)    -  Primary   Relevant Orders   C-reactive protein   CBC   COMPLETE METABOLIC PANEL WITH GFR   Hardware complicating wound infection, subsequent encounter               No orders of the defined types were placed in this encounter.    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 Likely will need long term antibiotics suppression   ------ 5/2 assessment Needing more I&D with plastic surgery As above 4/25 and 4/28 operative cx e faecalis   Diarrhea with amp-sulb   Will change abx to ampicillin alone. She'll need long term suppressive amoxicillin after 6 weeks of ampicillin, and  can start clock on 4/28       ---------------- 07/09/22 id assessment Patient remains on iv ampicillin via picc without adverse effect She has been seen at surgery clinic recently for wound vac change since discharged  from hospital on 06/18/22 She had no diarrhea or n/v/rash  Her last opat labs 5/28 reviewed Crp 2 (<10); sed rate 65  Lft 11/15/88/0.2 Cbc7/10/300; cr 0.63; alb 4  08/25/22 id clinic assessment Concerning purulence/pain and still not yet all closed up wound Labs today to assess abx toxicity/inflammatory changes Phenergan refill If I&D needed will likely need to switch [name] to iv abx for a few weeks before going to amox again. And she should be seen by inpatient ID team Otherwise see me in 3-4 weeks Continue amox for now   09/18/22 id clinic assessment Patient had plastic surgery debridement of the wound skin/soft tissue and placement of another graft along with myriad. I reviewed operative note. 1 cm opening noted within the knee joint with hardware exposed. No pus described during surgery No cultures done She said she feels very good with the knee much better in terms of pain/discomfort No purulence Minimal output into the wound vac She continues on amoxicillin  Will get labs next week and see her in 6-8 week  11/03/22 id clinic assessment Patient to have aka tomorrow with dr [name]. She continues to have purulent drainage in the wound vac; and the wound doesn't heal; and there is significant pain in the infected area. Skin graft trial previously from plastic surgery and that failed as well. The hardware remains exposed  Inpatient id team will need to see patient when she is getting amputation (tomorrow 9/25) and culture would need to be sent. She'll need targetted therapy for at least another 6 weeks after amputation and hopefully all the hardware will be removed    F/u with me 2-3 weeks after release from hospital    Follow-up: Return in about 4 weeks (around 12/01/2022).      Raymondo Band, MD [facility] 11/03/2022, 2:44 PM

## 2022-11-03 NOTE — Progress Notes (Signed)
PCP - Dr. Roseanne Reno, S.  Cardiologist - Denies  EP- Denies  Endocrine- Denies  Pulm- Denies  Chest x-ray - Denies  EKG - 03/17/22 (E)  Stress Test - Denies  ECHO - 10/04/12 (E)  Cardiac Cath - Denies  AICD-na PM-na LOOP-na  Nerve Stimulator- Denies  Dialysis- Denies  Sleep Study - Denies CPAP - Denies  LABS- 10/13/22(E): CBC w/D, CMP  ASA- Denies  ERAS- Yes- clears until 0630  HA1C- Denies GLP-1- Denies  Anesthesia- No  Pt denies having chest pain, sob, or fever at this time. All instructions explained to the pt, with a verbal understanding of the material. Pt agrees to go over the instructions while at home for a better understanding. The opportunity to ask questions was provided.

## 2022-11-04 ENCOUNTER — Inpatient Hospital Stay (HOSPITAL_COMMUNITY)
Admission: RE | Admit: 2022-11-04 | Discharge: 2022-11-13 | DRG: 464 | Disposition: A | Discharge status: Rehabilitation Facility | Payer: Medicare Other | Attending: Orthopedic Surgery | Admitting: Orthopedic Surgery

## 2022-11-04 ENCOUNTER — Encounter (HOSPITAL_COMMUNITY)
Admission: RE | Disposition: A | Discharge status: Rehabilitation Facility | Payer: Self-pay | Source: Home / Self Care | Attending: Orthopedic Surgery

## 2022-11-04 ENCOUNTER — Inpatient Hospital Stay (HOSPITAL_COMMUNITY): Payer: Medicare Other

## 2022-11-04 ENCOUNTER — Other Ambulatory Visit: Payer: Self-pay

## 2022-11-04 ENCOUNTER — Encounter (HOSPITAL_COMMUNITY): Payer: Self-pay | Admitting: Orthopedic Surgery

## 2022-11-04 DIAGNOSIS — D62 Acute posthemorrhagic anemia: Secondary | ICD-10-CM | POA: Diagnosis not present

## 2022-11-04 DIAGNOSIS — K219 Gastro-esophageal reflux disease without esophagitis: Secondary | ICD-10-CM | POA: Diagnosis present

## 2022-11-04 DIAGNOSIS — I10 Essential (primary) hypertension: Secondary | ICD-10-CM | POA: Diagnosis present

## 2022-11-04 DIAGNOSIS — K7460 Unspecified cirrhosis of liver: Secondary | ICD-10-CM | POA: Diagnosis present

## 2022-11-04 DIAGNOSIS — Z833 Family history of diabetes mellitus: Secondary | ICD-10-CM

## 2022-11-04 DIAGNOSIS — M86652 Other chronic osteomyelitis, left thigh: Secondary | ICD-10-CM | POA: Diagnosis not present

## 2022-11-04 DIAGNOSIS — T8454XA Infection and inflammatory reaction due to internal left knee prosthesis, initial encounter: Secondary | ICD-10-CM

## 2022-11-04 DIAGNOSIS — Z4781 Encounter for orthopedic aftercare following surgical amputation: Secondary | ICD-10-CM | POA: Diagnosis not present

## 2022-11-04 DIAGNOSIS — G548 Other nerve root and plexus disorders: Secondary | ICD-10-CM | POA: Diagnosis not present

## 2022-11-04 DIAGNOSIS — Z23 Encounter for immunization: Secondary | ICD-10-CM | POA: Diagnosis present

## 2022-11-04 DIAGNOSIS — Z89612 Acquired absence of left leg above knee: Principal | ICD-10-CM

## 2022-11-04 DIAGNOSIS — G546 Phantom limb syndrome with pain: Secondary | ICD-10-CM | POA: Diagnosis not present

## 2022-11-04 DIAGNOSIS — T847XXD Infection and inflammatory reaction due to other internal orthopedic prosthetic devices, implants and grafts, subsequent encounter: Secondary | ICD-10-CM | POA: Diagnosis not present

## 2022-11-04 DIAGNOSIS — I82412 Acute embolism and thrombosis of left femoral vein: Secondary | ICD-10-CM | POA: Diagnosis not present

## 2022-11-04 DIAGNOSIS — F3175 Bipolar disorder, in partial remission, most recent episode depressed: Secondary | ICD-10-CM | POA: Diagnosis not present

## 2022-11-04 DIAGNOSIS — I69328 Other speech and language deficits following cerebral infarction: Secondary | ICD-10-CM | POA: Diagnosis not present

## 2022-11-04 DIAGNOSIS — L97829 Non-pressure chronic ulcer of other part of left lower leg with unspecified severity: Secondary | ICD-10-CM | POA: Diagnosis present

## 2022-11-04 DIAGNOSIS — Z8781 Personal history of (healed) traumatic fracture: Secondary | ICD-10-CM

## 2022-11-04 DIAGNOSIS — Z87891 Personal history of nicotine dependence: Secondary | ICD-10-CM | POA: Diagnosis not present

## 2022-11-04 DIAGNOSIS — M8580 Other specified disorders of bone density and structure, unspecified site: Secondary | ICD-10-CM | POA: Diagnosis present

## 2022-11-04 DIAGNOSIS — Y792 Prosthetic and other implants, materials and accessory orthopedic devices associated with adverse incidents: Secondary | ICD-10-CM | POA: Diagnosis present

## 2022-11-04 DIAGNOSIS — Z79811 Long term (current) use of aromatase inhibitors: Secondary | ICD-10-CM

## 2022-11-04 DIAGNOSIS — E785 Hyperlipidemia, unspecified: Secondary | ICD-10-CM | POA: Diagnosis present

## 2022-11-04 DIAGNOSIS — E876 Hypokalemia: Secondary | ICD-10-CM | POA: Diagnosis present

## 2022-11-04 DIAGNOSIS — I639 Cerebral infarction, unspecified: Secondary | ICD-10-CM

## 2022-11-04 DIAGNOSIS — Z803 Family history of malignant neoplasm of breast: Secondary | ICD-10-CM

## 2022-11-04 DIAGNOSIS — F319 Bipolar disorder, unspecified: Secondary | ICD-10-CM | POA: Diagnosis present

## 2022-11-04 DIAGNOSIS — Z853 Personal history of malignant neoplasm of breast: Secondary | ICD-10-CM | POA: Diagnosis not present

## 2022-11-04 DIAGNOSIS — T8459XA Infection and inflammatory reaction due to other internal joint prosthesis, initial encounter: Secondary | ICD-10-CM | POA: Diagnosis not present

## 2022-11-04 DIAGNOSIS — Z8041 Family history of malignant neoplasm of ovary: Secondary | ICD-10-CM | POA: Diagnosis not present

## 2022-11-04 DIAGNOSIS — I70202 Unspecified atherosclerosis of native arteries of extremities, left leg: Secondary | ICD-10-CM | POA: Diagnosis present

## 2022-11-04 DIAGNOSIS — M869 Osteomyelitis, unspecified: Secondary | ICD-10-CM

## 2022-11-04 DIAGNOSIS — Z923 Personal history of irradiation: Secondary | ICD-10-CM | POA: Diagnosis not present

## 2022-11-04 DIAGNOSIS — Z96659 Presence of unspecified artificial knee joint: Secondary | ICD-10-CM

## 2022-11-04 DIAGNOSIS — Z800 Family history of malignant neoplasm of digestive organs: Secondary | ICD-10-CM | POA: Diagnosis not present

## 2022-11-04 DIAGNOSIS — Z79899 Other long term (current) drug therapy: Secondary | ICD-10-CM | POA: Diagnosis not present

## 2022-11-04 DIAGNOSIS — I69354 Hemiplegia and hemiparesis following cerebral infarction affecting left non-dominant side: Secondary | ICD-10-CM

## 2022-11-04 DIAGNOSIS — I96 Gangrene, not elsewhere classified: Secondary | ICD-10-CM | POA: Diagnosis present

## 2022-11-04 DIAGNOSIS — F419 Anxiety disorder, unspecified: Secondary | ICD-10-CM | POA: Diagnosis present

## 2022-11-04 DIAGNOSIS — M65162 Other infective (teno)synovitis, left knee: Secondary | ICD-10-CM | POA: Diagnosis present

## 2022-11-04 DIAGNOSIS — Z888 Allergy status to other drugs, medicaments and biological substances status: Secondary | ICD-10-CM

## 2022-11-04 DIAGNOSIS — R52 Pain, unspecified: Secondary | ICD-10-CM | POA: Diagnosis not present

## 2022-11-04 HISTORY — PX: AMPUTATION: SHX166

## 2022-11-04 HISTORY — PX: HARDWARE REMOVAL: SHX979

## 2022-11-04 LAB — CBC
HCT: 31.5 % — ABNORMAL LOW (ref 35.0–45.0)
Hemoglobin: 9.8 g/dL — ABNORMAL LOW (ref 11.7–15.5)
MCH: 26.6 pg — ABNORMAL LOW (ref 27.0–33.0)
MCHC: 31.1 g/dL — ABNORMAL LOW (ref 32.0–36.0)
MCV: 85.6 fL (ref 80.0–100.0)
MPV: 10.2 fL (ref 7.5–12.5)
Platelets: 422 10*3/uL — ABNORMAL HIGH (ref 140–400)
RBC: 3.68 10*6/uL — ABNORMAL LOW (ref 3.80–5.10)
RDW: 15.7 % — ABNORMAL HIGH (ref 11.0–15.0)
WBC: 5.5 10*3/uL (ref 3.8–10.8)

## 2022-11-04 LAB — COMPLETE METABOLIC PANEL WITH GFR
AG Ratio: 1.3 (calc) (ref 1.0–2.5)
ALT: 8 U/L (ref 6–29)
AST: 10 U/L (ref 10–35)
Albumin: 4 g/dL (ref 3.6–5.1)
Alkaline phosphatase (APISO): 94 U/L (ref 37–153)
BUN: 15 mg/dL (ref 7–25)
CO2: 22 mmol/L (ref 20–32)
Calcium: 9.8 mg/dL (ref 8.6–10.4)
Chloride: 109 mmol/L (ref 98–110)
Creat: 0.8 mg/dL (ref 0.50–1.05)
Globulin: 3 g/dL (calc) (ref 1.9–3.7)
Glucose, Bld: 104 mg/dL — ABNORMAL HIGH (ref 65–99)
Potassium: 3.6 mmol/L (ref 3.5–5.3)
Sodium: 143 mmol/L (ref 135–146)
Total Bilirubin: 0.2 mg/dL (ref 0.2–1.2)
Total Protein: 7 g/dL (ref 6.1–8.1)
eGFR: 80 mL/min/{1.73_m2} (ref >=60)

## 2022-11-04 LAB — POCT I-STAT, CHEM 8
BUN: 14 mg/dL (ref 8–23)
Calcium, Ion: 1.27 mmol/L (ref 1.15–1.40)
Chloride: 108 mmol/L (ref 98–111)
Creatinine, Ser: 0.7 mg/dL (ref 0.44–1.00)
Glucose, Bld: 89 mg/dL (ref 70–99)
HCT: 32 % — ABNORMAL LOW (ref 36.0–46.0)
Hemoglobin: 10.9 g/dL — ABNORMAL LOW (ref 12.0–15.0)
Potassium: 3.6 mmol/L (ref 3.5–5.1)
Sodium: 143 mmol/L (ref 135–145)
TCO2: 22 mmol/L (ref 22–32)

## 2022-11-04 LAB — SURGICAL PCR SCREEN
MRSA, PCR: NEGATIVE
Staphylococcus aureus: NEGATIVE

## 2022-11-04 LAB — PREPARE RBC (CROSSMATCH)

## 2022-11-04 LAB — C-REACTIVE PROTEIN: CRP: 9.6 mg/L — ABNORMAL HIGH (ref <8.0)

## 2022-11-04 SURGERY — REMOVAL, HARDWARE
Anesthesia: General | Site: Leg Upper | Laterality: Left

## 2022-11-04 MED ORDER — SUGAMMADEX SODIUM 200 MG/2ML IV SOLN
INTRAVENOUS | Status: DC | PRN
  Administered 2022-11-04: 150 mg via INTRAVENOUS

## 2022-11-04 MED ORDER — MAGNESIUM CITRATE PO SOLN: 1.0000 | Freq: Once | ORAL | Status: DC | PRN

## 2022-11-04 MED ORDER — LACTATED RINGERS IV SOLN: INTRAVENOUS | Status: DC

## 2022-11-04 MED ORDER — HYDRALAZINE HCL 25 MG PO TABS
25.0000 mg | ORAL_TABLET | Freq: Two times a day (BID) | ORAL | Status: DC
  Administered 2022-11-04 – 2022-11-13 (×18): 25 mg via ORAL
  Filled 2022-11-04 (×18): qty 1

## 2022-11-04 MED ORDER — LABETALOL HCL 5 MG/ML IV SOLN
10.0000 mg | INTRAVENOUS | Status: DC | PRN
  Administered 2022-11-13: 10 mg via INTRAVENOUS
  Filled 2022-11-04: qty 4

## 2022-11-04 MED ORDER — HYDROMORPHONE HCL 1 MG/ML IJ SOLN
0.5000 mg | INTRAMUSCULAR | Status: DC | PRN
  Administered 2022-11-04 (×2): 1 mg via INTRAVENOUS
  Administered 2022-11-05: 0.5 mg via INTRAVENOUS
  Administered 2022-11-06: 1 mg via INTRAVENOUS
  Administered 2022-11-07: 0.5 mg via INTRAVENOUS
  Administered 2022-11-08 – 2022-11-13 (×3): 1 mg via INTRAVENOUS
  Filled 2022-11-04 (×12): qty 1

## 2022-11-04 MED ORDER — 0.9 % SODIUM CHLORIDE (POUR BTL) OPTIME
TOPICAL | Status: DC | PRN
  Administered 2022-11-04 (×2): 1000 mL

## 2022-11-04 MED ORDER — VANCOMYCIN HCL 1250 MG/250ML IV SOLN
1250.0000 mg | INTRAVENOUS | Status: DC
  Administered 2022-11-05 – 2022-11-09 (×5): 1250 mg via INTRAVENOUS
  Filled 2022-11-04 (×5): qty 250

## 2022-11-04 MED ORDER — VASHE WOUND IRRIGATION OPTIME
TOPICAL | Status: DC | PRN
  Administered 2022-11-04: 34 [oz_av]

## 2022-11-04 MED ORDER — HYDROMORPHONE HCL 1 MG/ML IJ SOLN
INTRAMUSCULAR | Status: AC
  Filled 2022-11-04: qty 1

## 2022-11-04 MED ORDER — VANCOMYCIN HCL 1000 MG IV SOLR
INTRAVENOUS | Status: AC
  Filled 2022-11-04: qty 40

## 2022-11-04 MED ORDER — LIDOCAINE 2% (20 MG/ML) 5 ML SYRINGE
INTRAMUSCULAR | Status: DC | PRN
  Administered 2022-11-04: 20 mg via INTRAVENOUS

## 2022-11-04 MED ORDER — POLYETHYLENE GLYCOL 3350 17 G PO PACK
17.0000 g | PACK | Freq: Every day | ORAL | Status: DC | PRN
  Administered 2022-11-13: 17 g via ORAL
  Filled 2022-11-04: qty 1

## 2022-11-04 MED ORDER — PANTOPRAZOLE SODIUM 40 MG PO TBEC
40.0000 mg | DELAYED_RELEASE_TABLET | Freq: Every day | ORAL | Status: DC
  Administered 2022-11-05 – 2022-11-13 (×9): 40 mg via ORAL
  Filled 2022-11-04 (×9): qty 1

## 2022-11-04 MED ORDER — CEFAZOLIN SODIUM-DEXTROSE 2-4 GM/100ML-% IV SOLN
2.0000 g | INTRAVENOUS | Status: AC
  Administered 2022-11-04: 2 g via INTRAVENOUS
  Filled 2022-11-04: qty 100

## 2022-11-04 MED ORDER — ANASTROZOLE 1 MG PO TABS
1.0000 mg | ORAL_TABLET | Freq: Every day | ORAL | Status: DC
  Administered 2022-11-04 – 2022-11-13 (×10): 1 mg via ORAL
  Filled 2022-11-04 (×11): qty 1

## 2022-11-04 MED ORDER — TRANEXAMIC ACID-NACL 1000-0.7 MG/100ML-% IV SOLN
1000.0000 mg | INTRAVENOUS | Status: AC
  Administered 2022-11-04: 1000 mg via INTRAVENOUS
  Filled 2022-11-04: qty 100

## 2022-11-04 MED ORDER — CHLORHEXIDINE GLUCONATE 0.12 % MT SOLN
15.0000 mL | Freq: Once | OROMUCOSAL | Status: AC
  Administered 2022-11-04: 15 mL via OROMUCOSAL

## 2022-11-04 MED ORDER — ONDANSETRON HCL 4 MG/2ML IJ SOLN
INTRAMUSCULAR | Status: AC
  Filled 2022-11-04: qty 2

## 2022-11-04 MED ORDER — METOPROLOL TARTRATE 5 MG/5ML IV SOLN: 2.0000 mg | INTRAVENOUS | Status: DC | PRN

## 2022-11-04 MED ORDER — ONDANSETRON HCL 4 MG/2ML IJ SOLN
INTRAMUSCULAR | Status: DC | PRN
  Administered 2022-11-04: 4 mg via INTRAVENOUS

## 2022-11-04 MED ORDER — MIDAZOLAM HCL 2 MG/2ML IJ SOLN: 0.5000 mg | Freq: Once | INTRAMUSCULAR | Status: DC | PRN

## 2022-11-04 MED ORDER — PROPOFOL 10 MG/ML IV BOLUS
INTRAVENOUS | Status: AC
  Filled 2022-11-04: qty 20

## 2022-11-04 MED ORDER — HYDRALAZINE HCL 20 MG/ML IJ SOLN: 5.0000 mg | INTRAMUSCULAR | Status: DC | PRN

## 2022-11-04 MED ORDER — PROMETHAZINE HCL 25 MG/ML IJ SOLN: 6.2500 mg | INTRAMUSCULAR | Status: DC | PRN

## 2022-11-04 MED ORDER — KETAMINE HCL 50 MG/5ML IJ SOSY
PREFILLED_SYRINGE | INTRAMUSCULAR | Status: AC
  Filled 2022-11-04: qty 5

## 2022-11-04 MED ORDER — EPHEDRINE SULFATE-NACL 50-0.9 MG/10ML-% IV SOSY
PREFILLED_SYRINGE | INTRAVENOUS | Status: DC | PRN
  Administered 2022-11-04: 5 mg via INTRAVENOUS

## 2022-11-04 MED ORDER — BUPROPION HCL ER (SR) 150 MG PO TB12
150.0000 mg | ORAL_TABLET | Freq: Two times a day (BID) | ORAL | Status: DC
  Administered 2022-11-04 – 2022-11-13 (×19): 150 mg via ORAL
  Filled 2022-11-04 (×21): qty 1

## 2022-11-04 MED ORDER — KETAMINE HCL 10 MG/ML IJ SOLN
INTRAMUSCULAR | Status: DC | PRN
  Administered 2022-11-04: 20 mg via INTRAVENOUS

## 2022-11-04 MED ORDER — LIDOCAINE 2% (20 MG/ML) 5 ML SYRINGE
INTRAMUSCULAR | Status: AC
  Filled 2022-11-04: qty 5

## 2022-11-04 MED ORDER — PHENOL 1.4 % MT LIQD: 1.0000 | OROMUCOSAL | Status: DC | PRN

## 2022-11-04 MED ORDER — DOCUSATE SODIUM 100 MG PO CAPS
100.0000 mg | ORAL_CAPSULE | Freq: Every day | ORAL | Status: DC
  Administered 2022-11-05 – 2022-11-13 (×9): 100 mg via ORAL
  Filled 2022-11-04 (×9): qty 1

## 2022-11-04 MED ORDER — BISACODYL 5 MG PO TBEC
5.0000 mg | DELAYED_RELEASE_TABLET | Freq: Every day | ORAL | Status: DC | PRN
  Administered 2022-11-10: 5 mg via ORAL
  Filled 2022-11-04: qty 1

## 2022-11-04 MED ORDER — ROCURONIUM BROMIDE 10 MG/ML (PF) SYRINGE
PREFILLED_SYRINGE | INTRAVENOUS | Status: AC
  Filled 2022-11-04: qty 10

## 2022-11-04 MED ORDER — ROCURONIUM BROMIDE 10 MG/ML (PF) SYRINGE
PREFILLED_SYRINGE | INTRAVENOUS | Status: DC | PRN
  Administered 2022-11-04: 60 mg via INTRAVENOUS

## 2022-11-04 MED ORDER — OXYCODONE HCL 5 MG PO TABS: 5.0000 mg | ORAL_TABLET | Freq: Once | ORAL | Status: DC | PRN

## 2022-11-04 MED ORDER — PROPOFOL 10 MG/ML IV BOLUS
INTRAVENOUS | Status: DC | PRN
  Administered 2022-11-04: 130 mg via INTRAVENOUS

## 2022-11-04 MED ORDER — DEXAMETHASONE SODIUM PHOSPHATE 10 MG/ML IJ SOLN
INTRAMUSCULAR | Status: DC | PRN
  Administered 2022-11-04: 10 mg via INTRAVENOUS

## 2022-11-04 MED ORDER — AMLODIPINE BESYLATE 10 MG PO TABS
10.0000 mg | ORAL_TABLET | Freq: Every day | ORAL | Status: DC
  Administered 2022-11-05 – 2022-11-13 (×9): 10 mg via ORAL
  Filled 2022-11-04 (×9): qty 1

## 2022-11-04 MED ORDER — VANCOMYCIN HCL 1500 MG/300ML IV SOLN
1500.0000 mg | Freq: Once | INTRAVENOUS | Status: AC
  Administered 2022-11-04: 1500 mg via INTRAVENOUS
  Filled 2022-11-04 (×2): qty 300

## 2022-11-04 MED ORDER — ALUM & MAG HYDROXIDE-SIMETH 200-200-20 MG/5ML PO SUSP: 15.0000 mL | ORAL | Status: DC | PRN

## 2022-11-04 MED ORDER — HYDROMORPHONE HCL 1 MG/ML IJ SOLN
INTRAMUSCULAR | Status: AC
  Filled 2022-11-04: qty 0.5

## 2022-11-04 MED ORDER — POTASSIUM CHLORIDE CRYS ER 20 MEQ PO TBCR
20.0000 meq | EXTENDED_RELEASE_TABLET | Freq: Every day | ORAL | Status: AC | PRN
  Administered 2022-11-07: 40 meq via ORAL
  Filled 2022-11-04: qty 2

## 2022-11-04 MED ORDER — ONDANSETRON HCL 4 MG/2ML IJ SOLN: 4.0000 mg | Freq: Four times a day (QID) | INTRAMUSCULAR | Status: DC | PRN

## 2022-11-04 MED ORDER — SODIUM CHLORIDE 0.9 % IV SOLN: INTRAVENOUS | Status: DC

## 2022-11-04 MED ORDER — MIDAZOLAM HCL 2 MG/2ML IJ SOLN
INTRAMUSCULAR | Status: DC | PRN
  Administered 2022-11-04: 2 mg via INTRAVENOUS

## 2022-11-04 MED ORDER — PANTOPRAZOLE SODIUM 40 MG PO TBEC: 40.0000 mg | DELAYED_RELEASE_TABLET | Freq: Every day | ORAL | Status: DC

## 2022-11-04 MED ORDER — CEFAZOLIN SODIUM-DEXTROSE 2-4 GM/100ML-% IV SOLN
2.0000 g | Freq: Three times a day (TID) | INTRAVENOUS | Status: DC
  Administered 2022-11-04: 2 g via INTRAVENOUS
  Filled 2022-11-04: qty 100

## 2022-11-04 MED ORDER — VANCOMYCIN HCL 1000 MG IV SOLR
INTRAVENOUS | Status: DC | PRN
Start: 2022-11-04 — End: 2022-11-04
  Administered 2022-11-04: 2 g via TOPICAL

## 2022-11-04 MED ORDER — OXYCODONE HCL 5 MG PO TABS
5.0000 mg | ORAL_TABLET | ORAL | Status: DC | PRN
  Administered 2022-11-04 – 2022-11-06 (×2): 5 mg via ORAL
  Filled 2022-11-04 (×2): qty 1

## 2022-11-04 MED ORDER — MEPERIDINE HCL 25 MG/ML IJ SOLN: 6.2500 mg | INTRAMUSCULAR | Status: DC | PRN

## 2022-11-04 MED ORDER — TRANEXAMIC ACID 1000 MG/10ML IV SOLN
2000.0000 mg | INTRAVENOUS | Status: DC
  Filled 2022-11-04: qty 20

## 2022-11-04 MED ORDER — OXYCODONE HCL 5 MG/5ML PO SOLN: 5.0000 mg | Freq: Once | ORAL | Status: DC | PRN

## 2022-11-04 MED ORDER — HYDROMORPHONE HCL 1 MG/ML IJ SOLN
INTRAMUSCULAR | Status: DC | PRN
  Administered 2022-11-04 (×2): .5 mg via INTRAVENOUS

## 2022-11-04 MED ORDER — GUAIFENESIN-DM 100-10 MG/5ML PO SYRP: 15.0000 mL | ORAL_SOLUTION | ORAL | Status: DC | PRN

## 2022-11-04 MED ORDER — STERILE WATER FOR IRRIGATION IR SOLN
Status: DC | PRN
Start: 2022-11-04 — End: 2022-11-04
  Administered 2022-11-04: 1000 mL

## 2022-11-04 MED ORDER — ZINC SULFATE 220 (50 ZN) MG PO CAPS
220.0000 mg | ORAL_CAPSULE | Freq: Every day | ORAL | Status: DC
  Administered 2022-11-04 – 2022-11-13 (×10): 220 mg via ORAL
  Filled 2022-11-04 (×10): qty 1

## 2022-11-04 MED ORDER — MAGNESIUM SULFATE 2 GM/50ML IV SOLN: 2.0000 g | Freq: Every day | INTRAVENOUS | Status: DC | PRN

## 2022-11-04 MED ORDER — MIDAZOLAM HCL 2 MG/2ML IJ SOLN
INTRAMUSCULAR | Status: AC
  Filled 2022-11-04: qty 2

## 2022-11-04 MED ORDER — JUVEN PO PACK
1.0000 | PACK | Freq: Two times a day (BID) | ORAL | Status: DC
  Administered 2022-11-04 – 2022-11-13 (×19): 1 via ORAL
  Filled 2022-11-04 (×19): qty 1

## 2022-11-04 MED ORDER — SODIUM CHLORIDE 0.9 % IV SOLN: 10.0000 mL/h | Freq: Once | INTRAVENOUS | Status: AC

## 2022-11-04 MED ORDER — ORAL CARE MOUTH RINSE: 15.0000 mL | Freq: Once | OROMUCOSAL | Status: AC

## 2022-11-04 MED ORDER — DEXAMETHASONE SODIUM PHOSPHATE 10 MG/ML IJ SOLN
INTRAMUSCULAR | Status: AC
  Filled 2022-11-04: qty 1

## 2022-11-04 MED ORDER — SODIUM CHLORIDE 0.9 % IV SOLN
2.0000 g | INTRAVENOUS | Status: DC
  Administered 2022-11-04 – 2022-11-13 (×10): 2 g via INTRAVENOUS
  Filled 2022-11-04 (×10): qty 20

## 2022-11-04 MED ORDER — HYDROMORPHONE HCL 1 MG/ML IJ SOLN
0.2500 mg | INTRAMUSCULAR | Status: DC | PRN
  Administered 2022-11-04 (×4): 0.5 mg via INTRAVENOUS

## 2022-11-04 MED ORDER — OXYCODONE HCL 5 MG PO TABS
10.0000 mg | ORAL_TABLET | ORAL | Status: DC | PRN
  Administered 2022-11-04: 15 mg via ORAL
  Administered 2022-11-05 (×2): 10 mg via ORAL
  Administered 2022-11-05: 15 mg via ORAL
  Administered 2022-11-06: 10 mg via ORAL
  Administered 2022-11-06 – 2022-11-07 (×4): 15 mg via ORAL
  Administered 2022-11-07 – 2022-11-08 (×2): 10 mg via ORAL
  Administered 2022-11-08 (×2): 15 mg via ORAL
  Administered 2022-11-09 (×3): 10 mg via ORAL
  Administered 2022-11-09 – 2022-11-13 (×17): 15 mg via ORAL
  Filled 2022-11-04 (×7): qty 3
  Filled 2022-11-04: qty 2
  Filled 2022-11-04 (×2): qty 3
  Filled 2022-11-04: qty 2
  Filled 2022-11-04 (×3): qty 3
  Filled 2022-11-04: qty 2
  Filled 2022-11-04 (×6): qty 3
  Filled 2022-11-04 (×2): qty 2
  Filled 2022-11-04 (×5): qty 3
  Filled 2022-11-04: qty 2
  Filled 2022-11-04 (×2): qty 3
  Filled 2022-11-04 (×2): qty 2
  Filled 2022-11-04: qty 3

## 2022-11-04 MED ORDER — VITAMIN C 500 MG PO TABS
1000.0000 mg | ORAL_TABLET | Freq: Every day | ORAL | Status: DC
  Administered 2022-11-04 – 2022-11-13 (×10): 1000 mg via ORAL
  Filled 2022-11-04 (×10): qty 2

## 2022-11-04 MED ORDER — ACETAMINOPHEN 325 MG PO TABS
325.0000 mg | ORAL_TABLET | Freq: Four times a day (QID) | ORAL | Status: DC | PRN
  Administered 2022-11-07 – 2022-11-08 (×2): 650 mg via ORAL
  Administered 2022-11-12 – 2022-11-13 (×2): 325 mg via ORAL
  Filled 2022-11-04: qty 2
  Filled 2022-11-04 (×3): qty 1

## 2022-11-04 MED ORDER — HYDROMORPHONE HCL 1 MG/ML IJ SOLN
0.2500 mg | INTRAMUSCULAR | Status: DC | PRN
  Administered 2022-11-04 (×2): 0.5 mg via INTRAVENOUS

## 2022-11-04 MED ORDER — GABAPENTIN 100 MG PO CAPS
100.0000 mg | ORAL_CAPSULE | Freq: Every day | ORAL | Status: DC
  Administered 2022-11-04 – 2022-11-12 (×9): 100 mg via ORAL
  Filled 2022-11-04 (×9): qty 1

## 2022-11-04 SURGICAL SUPPLY — 73 items
BAG COUNTER SPONGE SURGICOUNT (BAG) ×2 IMPLANT
BAG SPNG CNTER NS LX DISP (BAG) ×1
BANDAGE ESMARK 6X9 LF (GAUZE/BANDAGES/DRESSINGS) IMPLANT
BLADE SAW RECIP 87.9 MT (BLADE) ×2 IMPLANT
BLADE SURG 21 STRL SS (BLADE) ×2 IMPLANT
BNDG CMPR 5X6 CHSV STRCH STRL (GAUZE/BANDAGES/DRESSINGS) ×2
BNDG CMPR 9X6 STRL LF SNTH (GAUZE/BANDAGES/DRESSINGS)
BNDG COHESIVE 4X5 TAN STRL (GAUZE/BANDAGES/DRESSINGS) IMPLANT
BNDG COHESIVE 6X5 TAN ST LF (GAUZE/BANDAGES/DRESSINGS) ×2 IMPLANT
BNDG ESMARK 6X9 LF (GAUZE/BANDAGES/DRESSINGS)
BNDG GAUZE DERMACEA FLUFF 4 (GAUZE/BANDAGES/DRESSINGS) ×2 IMPLANT
BNDG GZE DERMACEA 4 6PLY (GAUZE/BANDAGES/DRESSINGS)
BUR DISC 0.8X25 (BURR) IMPLANT
BUR STRYKR EGG 5.0 (BURR) IMPLANT
BUR SURG 4X8 MED (BURR) IMPLANT
BURR SURG 4X8 MED (BURR) ×1
CANISTER WOUND CARE 500ML ATS (WOUND CARE) IMPLANT
CLEANSER WND VASHE INSTL 34OZ (WOUND CARE) IMPLANT
CNTNR URN SCR LID CUP LEK RST (MISCELLANEOUS) IMPLANT
CONT SPEC 4OZ STRL OR WHT (MISCELLANEOUS) ×2
COVER SURGICAL LIGHT HANDLE (MISCELLANEOUS) ×4 IMPLANT
CUFF TOURN SGL QUICK 34 (TOURNIQUET CUFF)
CUFF TOURN SGL QUICK 42 (TOURNIQUET CUFF) IMPLANT
CUFF TRNQT CYL 34X4.125X (TOURNIQUET CUFF) IMPLANT
DRAPE C-ARM 42X72 X-RAY (DRAPES) IMPLANT
DRAPE INCISE IOBAN 66X45 STRL (DRAPES) ×4 IMPLANT
DRAPE ORTHO SPLIT 77X108 STRL (DRAPES) ×2
DRAPE SURG ORHT 6 SPLT 77X108 (DRAPES) IMPLANT
DRAPE U-SHAPE 47X51 STRL (DRAPES) ×2 IMPLANT
DRESSING PREVENA PLUS CUSTOM (GAUZE/BANDAGES/DRESSINGS) ×2 IMPLANT
DRSG EMULSION OIL 3X3 NADH (GAUZE/BANDAGES/DRESSINGS) ×2 IMPLANT
DRSG PREVENA PLUS CUSTOM (GAUZE/BANDAGES/DRESSINGS) ×1
DURAPREP 26ML APPLICATOR (WOUND CARE) ×2 IMPLANT
ELECT REM PT RETURN 9FT ADLT (ELECTROSURGICAL) ×1
ELECTRODE REM PT RTRN 9FT ADLT (ELECTROSURGICAL) ×2 IMPLANT
GAUZE PAD ABD 8X10 STRL (GAUZE/BANDAGES/DRESSINGS) ×2 IMPLANT
GAUZE SPONGE 4X4 12PLY STRL (GAUZE/BANDAGES/DRESSINGS) ×2 IMPLANT
GLOVE BIOGEL PI IND STRL 7.5 (GLOVE) ×2 IMPLANT
GLOVE BIOGEL PI IND STRL 9 (GLOVE) ×2 IMPLANT
GLOVE SURG ORTHO 9.0 STRL STRW (GLOVE) ×2 IMPLANT
GLOVE SURG SS PI 6.5 STRL IVOR (GLOVE) ×2 IMPLANT
GOWN STRL REUS W/ TWL LRG LVL3 (GOWN DISPOSABLE) ×2 IMPLANT
GOWN STRL REUS W/ TWL XL LVL3 (GOWN DISPOSABLE) ×6 IMPLANT
GOWN STRL REUS W/TWL LRG LVL3 (GOWN DISPOSABLE) ×1
GOWN STRL REUS W/TWL XL LVL3 (GOWN DISPOSABLE) ×3
GRAFT SKIN WND MICRO 38 (Tissue) IMPLANT
KIT BASIN OR (CUSTOM PROCEDURE TRAY) ×2 IMPLANT
KIT TURNOVER KIT B (KITS) ×2 IMPLANT
MANIFOLD NEPTUNE II (INSTRUMENTS) ×2 IMPLANT
NS IRRIG 1000ML POUR BTL (IV SOLUTION) ×2 IMPLANT
PACK ORTHO EXTREMITY (CUSTOM PROCEDURE TRAY) ×2 IMPLANT
PAD ARMBOARD 7.5X6 YLW CONV (MISCELLANEOUS) ×4 IMPLANT
PREVENA RESTOR ARTHOFORM 46X30 (CANNISTER) ×2 IMPLANT
RASP HELIOCORDIAL MED (MISCELLANEOUS) IMPLANT
SPONGE T-LAP 18X18 ~~LOC~~+RFID (SPONGE) IMPLANT
STAPLER VISISTAT 35W (STAPLE) IMPLANT
STOCKINETTE IMPERVIOUS 9X36 MD (GAUZE/BANDAGES/DRESSINGS) IMPLANT
STOCKINETTE IMPERVIOUS LG (DRAPES) IMPLANT
SUT ETHILON 2 0 PSLX (SUTURE) ×4 IMPLANT
SUT SILK 2 0 (SUTURE) ×1
SUT SILK 2-0 18XBRD TIE 12 (SUTURE) ×2 IMPLANT
SUT VIC AB 0 CT1 27 (SUTURE)
SUT VIC AB 0 CT1 27XBRD ANBCTR (SUTURE) IMPLANT
SUT VIC AB 1 CTX 36 (SUTURE) ×2
SUT VIC AB 1 CTX36XBRD ANBCTRL (SUTURE) IMPLANT
SUT VIC AB 2-0 CT1 27 (SUTURE)
SUT VIC AB 2-0 CT1 TAPERPNT 27 (SUTURE) IMPLANT
TOWEL GREEN STERILE (TOWEL DISPOSABLE) ×2 IMPLANT
TOWEL GREEN STERILE FF (TOWEL DISPOSABLE) ×2 IMPLANT
TUBE CONNECTING 20X1/4 (TUBING) ×2 IMPLANT
UNDERPAD 30X36 HEAVY ABSORB (UNDERPADS AND DIAPERS) ×2 IMPLANT
WATER STERILE IRR 1000ML POUR (IV SOLUTION) ×2 IMPLANT
YANKAUER SUCT BULB TIP NO VENT (SUCTIONS) ×2 IMPLANT

## 2022-11-04 NOTE — Progress Notes (Signed)
[facility]    Date of Admission:  11/04/2022   Total days of antibiotics 1   ID: Tammy [name] is a 67 y.o. 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 She was treated with iv amp/sub for 6 wk then on chronic oral amox, however she had develoed sinus tract that extendes 6 cm proximal to the [facility] and bone. Decision was made that she should undergo AKA to control infection on 9/25. She underwent AKA to left leg however some of the intramedullary implant was unable to be resected. She remains afebrile Principal Problem:   S/P AKA (above knee amputation) unilateral, left (HCC) Active Problems:   Infection of total knee replacement (HCC)    Subjective: afebrile  Medications:   [START ON 11/05/2022] amLODipine  10 mg Oral Daily   anastrozole  1 mg Oral Daily   vitamin C  1,000 mg Oral Daily   buPROPion  150 mg Oral BID   [START ON 11/05/2022] docusate sodium  100 mg Oral Daily   gabapentin  100 mg Oral QHS   hydrALAZINE  25 mg Oral BID   HYDROmorphone       HYDROmorphone       HYDROmorphone       nutrition supplement (JUVEN)  1 packet Oral BID BM   [START ON 11/05/2022] pantoprazole  40 mg Oral Daily   zinc sulfate  220 mg Oral Daily    Objective: Vital signs in last 24 hours: Temp:  [97.8 F (36.6 C)-98.7 F (37.1 C)] 97.8 F (36.6 C) (09/25 1345) Pulse Rate:  [57-78] 78 (09/25 1345) Resp:  [14-19] 18 (09/25 1345) BP: (126-154)/(64-85) 149/85 (09/25 1345) SpO2:  [91 %-100 %] 95 % (09/25 1345) Weight:  [68.5 kg] 68.5 kg (09/25 0717)  Physical Exam  Constitutional:  oriented to person, place, and time. appears well-developed and well-nourished. No distress.  HENT:  Pace/AT, PERRLA, no scleral icterus Mouth/Throat: Oropharynx is clear and moist. No oropharyngeal exudate.  Cardiovascular: Normal rate, regular rhythm and normal heart sounds. Exam reveals no gallop and no friction rub.  No murmur heard.  Pulmonary/Chest: Effort normal and breath sounds normal. No respiratory distress.  has no wheezes.  Neck = supple, no nuchal rigidity Abdominal: Soft. Bowel sounds are normal.  exhibits no distension. There is no tenderness.  ZDG:UYQI leg aka with wound vac in place Skin: Skin is warm and dry. No rash noted. No erythema.  Psychiatric: a normal mood and affect.  behavior is normal.    Lab Results Recent Labs    11/03/22 1502 11/04/22 0758  WBC 5.5  --   HGB 9.8* 10.9*  HCT 31.5* 32.0*  NA 143 143  K 3.6 3.6  CL 109 108  CO2 22  --   BUN 15 14  CREATININE 0.80 0.70   Liver Panel Recent Labs    11/03/22 1502  PROT 7.0  AST 10  ALT 8  BILITOT 0.2   Lab Results  Component Value Date   ESRSEDRATE 38 (H) 03/17/2022    C-Reactive Protein Recent Labs    11/03/22 1502  CRP 9.6*    Microbiology: pending Studies/Results: No results found.   Assessment/Plan: Chronic left femur osteomyelitis involving HW = will  empirically start vancomycin plus ceftriaxone today. Will follow operative cultures to see what her final regimen will be.  Pain management= per primary team  [facility] Pager: [phone]  11/04/2022, 5:01 PM

## 2022-11-04 NOTE — Anesthesia Postprocedure Evaluation (Signed)
Anesthesia Post Note  Patient: Tammy [name]  Procedure(s) Performed: HARDWARE REMOVAL INFECTED LEFT TOTAL KNEE ARTHROPLASTY (Left: Leg Upper) LEFT ABOVE KNEE AMPUTATION (Left: Leg Upper)     Patient location during evaluation: PACU Anesthesia Type: General Level of consciousness: awake and alert, patient cooperative and oriented Pain management: pain level controlled Vital Signs Assessment: post-procedure vital signs reviewed and stable Respiratory status: spontaneous breathing, nonlabored ventilation, respiratory function stable and patient connected to nasal cannula oxygen Cardiovascular status: blood pressure returned to baseline and stable Postop Assessment: no apparent nausea or vomiting Anesthetic complications: no   No notable events documented.  Last Vitals:  Vitals:   11/04/22 1330 11/04/22 1345  BP: 139/76 (!) 149/85  Pulse: 76 78  Resp: 18 18  Temp:  36.6 C  SpO2: 91% 95%    Last Pain:  Vitals:   11/04/22 1330  TempSrc:   PainSc: 5                  Arcangel Minion,E. Elivia Robotham

## 2022-11-04 NOTE — Transfer of Care (Signed)
Immediate Anesthesia Transfer of Care Note  Patient: Tammy [name]  Procedure(s) Performed: HARDWARE REMOVAL INFECTED LEFT TOTAL KNEE ARTHROPLASTY (Left: Leg Upper) LEFT ABOVE KNEE AMPUTATION (Left: Leg Upper)  Patient Location: PACU  Anesthesia Type:General  Level of Consciousness: awake, alert , oriented, patient cooperative, and responds to stimulation  Airway & Oxygen Therapy: Patient Spontanous Breathing and Patient connected to face mask oxygen  Post-op Assessment: Report given to RN and Post -op Vital signs reviewed and stable  Post vital signs: Reviewed and stable  Last Vitals:  Vitals Value Taken Time  BP 148/69 11/04/22 1135  Temp 36.6 C 11/04/22 1135  Pulse 57 11/04/22 1143  Resp 16 11/04/22 1143  SpO2 95 % 11/04/22 1143  Vitals shown include unfiled device data.    Complications: No notable events documented.

## 2022-11-04 NOTE — Interval H&P Note (Signed)
History and Physical Interval Note:  11/04/2022 9:25 AM  Tammy [name] Tammy [name]  has presented today for surgery, with the diagnosis of Osteomyelitis Left Tibia and Infected Total Knee Arthroplasty.  The various methods of treatment have been discussed with the patient and family. After consideration of risks, benefits and other options for treatment, the patient has consented to  Procedure(s): HARDWARE REMOVAL INFECTED LEFT TOTAL KNEE ARTHROPLASTY (Left) LEFT ABOVE KNEE AMPUTATION (Left) as a surgical intervention.  The patient's history has been reviewed, patient examined, no change in status, stable for surgery.  I have reviewed the patient's chart and labs.  Questions were answered to the patient's satisfaction.     Nadara Mustard

## 2022-11-04 NOTE — Anesthesia Preprocedure Evaluation (Addendum)
Anesthesia Evaluation  Patient identified by MRN, date of birth, ID band Patient awake    Reviewed: Allergy & Precautions, NPO status , Patient's Chart, lab work & pertinent test results  History of Anesthesia Complications Negative for: history of anesthetic complications  Airway Mallampati: II  TM Distance: >3 FB Neck ROM: Full    Dental  (+) Missing, Dental Advisory Given, Chipped, Poor Dentition   Pulmonary former smoker   breath sounds clear to auscultation       Cardiovascular hypertension, Pt. on medications (-) angina  Rhythm:Regular Rate:Normal     Neuro/Psych   Anxiety Depression Bipolar Disorder   CVA (L weakness), Residual Symptoms    GI/Hepatic ,GERD  Medicated and Controlled,,(+) Cirrhosis       , Hepatitis -, C  Endo/Other  negative endocrine ROS    Renal/GU negative Renal ROS     Musculoskeletal   Abdominal   Peds  Hematology  (+) Blood dyscrasia (Hb 9.8, plt 422k), anemia   Anesthesia Other Findings Breast cancer  Reproductive/Obstetrics                             Anesthesia Physical Anesthesia Plan  ASA: 3  Anesthesia Plan: General   Post-op Pain Management: Dilaudid IV and Ketamine IV*   Induction: Intravenous  PONV Risk Score and Plan: 3 and Ondansetron, Dexamethasone and Treatment may vary due to age or medical condition  Airway Management Planned: Oral ETT  Additional Equipment: None  Intra-op Plan:   Post-operative Plan: Extubation in OR  Informed Consent: I have reviewed the patients History and Physical, chart, labs and discussed the procedure including the risks, benefits and alternatives for the proposed anesthesia with the patient or authorized representative who has indicated his/her understanding and acceptance.     Dental advisory given  Plan Discussed with: CRNA and Surgeon  Anesthesia Plan Comments:         Anesthesia Quick  Evaluation

## 2022-11-04 NOTE — H&P (Signed)
Tammy [name] is an 67 y.o. female.   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   Patient underwent open reduction of her distal femur fracture April 10, 2021.  Patient eventually had the hardware removed in March 2024 with irrigation and debridement with Dr. [name] 3 separate times as well as distal femur replacement with total knee arthroplasty.  Patient has undergone 2 wound debridements and application of myriad with Dr. Ulice Bold on May 6 and August 5.  Past Medical History:  Diagnosis Date   Anemia    hx of   Anxiety    on meds   Arthritis    back/bilateral ankles   Bipolar disorder (HCC)    Breast cancer (HCC) 2022   LEFT breast-radiation   Depression    on meds   Family history of breast cancer 07/10/2020   Family history of colon cancer 07/10/2020   Family history of ovarian cancer 07/10/2020   GERD (gastroesophageal reflux disease)    on meds   Heart murmur    dx by PCP   Hepatitis C    HTN (hypertension)    on meds   Hx of radiation therapy    Liver cirrhosis (HCC)    from hep c   Neuromuscular disorder (HCC)    Osteopenia    Personal history of radiation therapy    Stroke (HCC) 09/2012   slurred speech and LEFT sided weakness/notes 10/04/2012    Past Surgical History:  Procedure Laterality Date   ANKLE FRACTURE SURGERY Left 2018   APPLICATION OF WOUND VAC Left 06/15/2022   Procedure: APPLICATION OF WOUND VAC;  Surgeon: Peggye Form, DO;  Location: [facility];  Service: Plastics;  Laterality: Left;   APPLICATION OF WOUND VAC Left 09/14/2022   Procedure: APPLICATION OF WOUND VAC;  Surgeon: Peggye Form, DO;  Location: [facility];  Service: Plastics;  Laterality: Left;   BREAST LUMPECTOMY Left 07/2020   BREAST LUMPECTOMY WITH RADIOACTIVE SEED  AND SENTINEL LYMPH NODE BIOPSY Left 07/26/2020   Procedure: LEFT BREAST LUMPECTOMY WITH RADIOACTIVE SEED AND SENTINEL LYMPH NODE BIOPSY;  Surgeon: Abigail Miyamoto, MD;  Location: [facility];  Service: General;  Laterality: Left;   CESAREAN SECTION  1986   COLONOSCOPY     DIAGNOSTIC LAPAROSCOPY  1985   HARDWARE REMOVAL Left 04/30/2022   Procedure: HARDWARE REMOVAL LEFT FEMUR;  Surgeon: Samson Frederic, MD;  Location: [facility];  Service: Orthopedics;  Laterality: Left;  150   I & D KNEE WITH POLY EXCHANGE Left 06/07/2022   Procedure: IRRIGATION AND DEBRIDEMENT KNEE WITH POLY EXCHANGE;  Surgeon: Samson Frederic, MD;  Location: [facility];  Service: Orthopedics;  Laterality: Left;   INCISION AND DRAINAGE OF WOUND Left 06/04/2022   Procedure: IRRIGATION AND DEBRIDEMENT KNEE;  Surgeon: Samson Frederic, MD;  Location: [facility];  Service: Orthopedics;  Laterality: Left;  60   INCISION AND DRAINAGE OF WOUND Left 06/15/2022   Procedure: excision of knee wound with Myriad;  Surgeon: Peggye Form, DO;  Location: [facility];  Service: Plastics;  Laterality: Left;   ORIF FEMUR FRACTURE Left 04/10/2021   Procedure: OPEN REDUCTION INTERNAL FIXATION (ORIF) DISTAL FEMUR FRACTURE;  Surgeon: Myrene Galas, MD;  Location: [facility];  Service: Orthopedics;  Laterality: Left;   PARTIAL KNEE ARTHROPLASTY Left 04/30/2022   Procedure: DISTAL FEMUR REPLACEMENT;  Surgeon: Samson Frederic, MD;  Location: [facility];  Service: Orthopedics;  Laterality: Left;  150    Family History  Problem Relation Age of Onset   Breast cancer Mother    Other Mother        blood clots   Diabetes Sister    Neuropathy Sister        diabetic neuropathy   Breast cancer Maternal Aunt        22   Colon cancer Maternal Aunt 74   Diabetes Paternal Aunt    Diabetes Brother    Neuropathy Brother        diabetic neuropathy   Breast cancer Other        PGM's sister; dx mid 65s   Colon cancer Other        PGM's brother; dx 29s    Ovarian cancer Other        PGM's mother; dx unknown age   Esophageal cancer Neg Hx    Rectal cancer Neg Hx    Stomach cancer Neg Hx    Colon polyps Neg Hx    Social History:  reports that she has quit smoking. Her smoking use included cigarettes. She has a 60 pack-year smoking history. She has never used smokeless tobacco. She reports that she does not currently use alcohol. She reports that she does not use drugs.  Allergies:  Allergies  Allergen Reactions   Lisinopril Cough    Medications Prior to Admission  Medication Sig Dispense Refill   acetaminophen (TYLENOL) 500 MG tablet Take 2 tablets (1,000 mg total) by mouth every 8 (eight) hours as needed for moderate pain, mild pain, fever or headache. 30 tablet 0   amLODipine (NORVASC) 10 MG tablet Take 1 tablet (10 mg total) by mouth daily. 30 tablet 0   amoxicillin (AMOXIL) 500 MG capsule Take 2 capsules (1,000 mg total) by mouth 3 (three) times daily. 180 capsule 11   anastrozole (ARIMIDEX) 1 MG tablet TAKE 1 TABLET BY MOUTH DAILY 90 tablet 3   Ascorbic Acid (VITAMIN C) 1000 MG tablet Take 1,000 mg by mouth daily.     buPROPion (WELLBUTRIN SR) 150 MG 12 hr tablet Take 150 mg by mouth 2 (two) times daily.     calcium carbonate (OS-CAL - DOSED IN MG OF ELEMENTAL CALCIUM) 1250 (500 Ca) MG tablet Take 1 tablet by mouth daily.     Cyanocobalamin (VITAMIN B-12 PO) Take 2,000 mcg by mouth daily.     gabapentin (NEURONTIN) 100 MG capsule Take 1 capsule (100 mg total) by mouth at bedtime. For hot flushes 30 capsule 1   hydrALAZINE (APRESOLINE) 25 MG tablet Take 1 tablet (25 mg total) by mouth 2 (two) times daily. 60 tablet 0   HYDROcodone-acetaminophen (NORCO) 10-325 MG tablet Take 1 tablet by mouth every 4 (four) hours as needed for severe pain or moderate pain.     meloxicam (MOBIC) 15 MG tablet Take 1 tablet (15 mg total) by mouth daily. (Patient taking differently: Take 15 mg by mouth daily as needed for pain.) 30 tablet 2   Menthol,  Topical Analgesic, (BENGAY EX) Apply 1 Application topically daily as needed (pain).     pantoprazole (PROTONIX) 40 MG tablet Take 1 tablet (40 mg total) by mouth daily. 30 tablet 0   potassium chloride (KLOR-CON) 10 MEQ tablet Take 10 mEq by mouth daily.     Prenatal Vit-Fe Fumarate-FA (PRENATAL PO) Take 1 tablet by mouth daily.     promethazine (PHENERGAN) 12.5 MG  tablet Take 1 tablet (12.5 mg total) by mouth every 8 (eight) hours as needed for nausea or vomiting. 20 tablet 1   zinc gluconate 50 MG tablet Take 50 mg by mouth daily.      Results for orders placed or performed in visit on 11/03/22 (from the past 48 hour(s))  C-reactive protein     Status: Abnormal   Collection Time: 11/03/22  3:02 PM  Result Value Ref Range   CRP 9.6 (H) <8.0 mg/L  CBC     Status: Abnormal   Collection Time: 11/03/22  3:02 PM  Result Value Ref Range   WBC 5.5 3.8 - 10.8 Thousand/uL   RBC 3.68 (L) 3.80 - 5.10 Million/uL   Hemoglobin 9.8 (L) 11.7 - 15.5 g/dL   HCT 16.1 (L) 09.6 - 04.5 %   MCV 85.6 80.0 - 100.0 fL   MCH 26.6 (L) 27.0 - 33.0 pg   MCHC 31.1 (L) 32.0 - 36.0 g/dL   RDW 40.9 (H) 81.1 - 91.4 %   Platelets 422 (H) 140 - 400 Thousand/uL   MPV 10.2 7.5 - 12.5 fL  COMPLETE METABOLIC PANEL WITH GFR     Status: Abnormal   Collection Time: 11/03/22  3:02 PM  Result Value Ref Range   Glucose, Bld 104 (H) 65 - 99 mg/dL    Comment: .            Fasting reference interval . For someone without known diabetes, a glucose value between 100 and 125 mg/dL is consistent with prediabetes and should be confirmed with a follow-up test. .    BUN 15 7 - 25 mg/dL   Creat 7.82 9.56 - 2.13 mg/dL   eGFR 80 > OR = 60 YQ/MVH/8.46N6   BUN/Creatinine Ratio SEE NOTE: 6 - 22 (calc)    Comment:    Not Reported: BUN and Creatinine are within    reference range. .    Sodium 143 135 - 146 mmol/L   Potassium 3.6 3.5 - 5.3 mmol/L   Chloride 109 98 - 110 mmol/L   CO2 22 20 - 32 mmol/L   Calcium 9.8 8.6 - 10.4  mg/dL   Total Protein 7.0 6.1 - 8.1 g/dL   Albumin 4.0 3.6 - 5.1 g/dL   Globulin 3.0 1.9 - 3.7 g/dL (calc)   AG Ratio 1.3 1.0 - 2.5 (calc)   Total Bilirubin 0.2 0.2 - 1.2 mg/dL   Alkaline phosphatase (APISO) 94 37 - 153 U/L   AST 10 10 - 35 U/L   ALT 8 6 - 29 U/L   No results found.  Review of Systems  All other systems reviewed and are negative.   Height 5' 2.5" (1.588 m), weight 68.5 kg. Physical Exam  Patient is alert, oriented, no adenopathy, well-dressed, normal affect, normal respiratory effort. Examination patient has a large wound over the patella.  There is a sinus tract that extends 6 cm proximal to the hardware and bone.  There is bubbling clear drainage.  Patient has essentially no range of motion of the knee.  There is swelling and cellulitis of the left lower extremity.   Patient has a triphasic dorsalis pedis pulse by Doppler. Assessment/Plan 1. 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 Infected hardware in left lower extremity, initial encounter [facility])       Plan: With the chronic sinus draining tract that extends to bone and hardware with the failure of multiple debridements, discussed that her recommended option  would be to proceed with an above-the-knee amputation.  Risks and benefits were discussed including a staged procedure for removal of the hardware and possible staged procedure for debridement of the soft tissue envelope.  Patient states she understands wished to proceed at this time.  Patient will need discharge to inpatient versus outpatient rehab.  Nadara Mustard, MD 11/04/2022, 7:06 AM

## 2022-11-04 NOTE — Anesthesia Procedure Notes (Signed)
Procedure Name: Intubation Date/Time: 11/04/2022 10:02 AM  Performed by: Earlene Plater, CRNAPre-anesthesia Checklist: Emergency Drugs available, Patient identified, Suction available, Patient being monitored and Timeout performed Patient Re-evaluated:Patient Re-evaluated prior to induction Oxygen Delivery Method: Circle system utilized Preoxygenation: Pre-oxygenation with 100% oxygen Induction Type: IV induction Ventilation: Mask ventilation without difficulty Laryngoscope Size: Mac and 3 Grade View: Grade I Tube type: Oral Number of attempts: 1 Airway Equipment and Method: Stylet Placement Confirmation: ETT inserted through vocal cords under direct vision, positive ETCO2, CO2 detector and breath sounds checked- equal and bilateral Secured at: 21 (@ lips; Breath sounds confirmed by Dr. Jean Rosenthal) cm Tube secured with: Tape Dental Injury: Teeth and Oropharynx as per pre-operative assessment

## 2022-11-04 NOTE — Progress Notes (Signed)
Pharmacy Antibiotic Note  Tammy [name] is a 67 y.o. female admitted on 11/04/2022 with osteomyelitis of left femur. S/P AKA today though some of the intramedullary implant was unable to be resected.  Pharmacy has been consulted for vancomycin dosing.Scr < 1 with CrCl ~ 60 ml/min. Noted patient only had vancomycin powder applied in the OR and had no systemic administration.  Plan: Vancomycin 1500  mg x 1  Then 1250 mg every 24 hours Predicted AUC 521 with SCr 0.8  Also on ceftriaxone 2 gm every 24 hours Monitor culture data, renal function and levels as appropriate   Height: 5' 2.5" (158.8 cm) Weight: 68.5 kg (151 lb) IBW/kg (Calculated) : 51.25  Temp (24hrs), Avg:98.1 F (36.7 C), Min:97.8 F (36.6 C), Max:98.7 F (37.1 C)  Recent Labs  Lab 11/03/22 1502 11/04/22 0758  WBC 5.5  --   CREATININE 0.80 0.70    Estimated Creatinine Clearance: 61.8 mL/min (by C-G formula based on SCr of 0.7 mg/dL).    Allergies  Allergen Reactions   Lisinopril Cough      Thank you for allowing pharmacy to be a part of this patient's care.  Sharin Mons, PharmD, BCPS, BCIDP Infectious Diseases Clinical Pharmacist Phone: [phone] 11/04/2022 5:15 PM

## 2022-11-04 NOTE — Plan of Care (Signed)
  Problem: Pain Management: Goal: Pain level will decrease with appropriate interventions Outcome: Progressing   Problem: Pain Management: Goal: Pain level will decrease with appropriate interventions Outcome: Progressing   Problem: Elimination: Goal: Will not experience complications related to urinary retention Outcome: Progressing

## 2022-11-04 NOTE — Op Note (Addendum)
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  POST-OPERATIVE DIAGNOSIS:  Same  PROCEDURE:  HARDWARE REMOVAL OF DISTAL FEMUR REPLACEMENT AND TOTAL KNEE ARTHROPLASTY  LEFT ABOVE KNEE AMPUTATION Placement of Kerecis micro graft 38 cm and 2 g vancomycin powder. Tissue and bone sent for cultures at the amputation site. Application of wound VAC with the customizable and Merton Border form sponges  SURGEON:  Nadara Mustard, MD  PHYSICIAN ASSISTANT:None ANESTHESIA:   General  PREOPERATIVE INDICATIONS:  Tammy [name] is a  67 y.o. female with a diagnosis of Osteomyelitis Left Tibia and Infected Total Knee Arthroplasty who failed conservative measures and elected for surgical management.    The risks benefits and alternatives were discussed with the patient preoperatively including but not limited to the risks of infection, bleeding, nerve injury, cardiopulmonary complications, the need for revision surgery, among others, and the patient was willing to proceed.  OPERATIVE IMPLANTS:   Implant Name Type Inv. Item Serial No. Manufacturer Lot No. LRB No. Used Action  GRAFT SKIN WND MICRO 38 - [id number] Tissue GRAFT SKIN WND MICRO 38  [employer] [id number] Left 1 Implanted    @ENCIMAGES @  OPERATIVE FINDINGS: Tissue margins were grossly clear.  Bone and soft tissue was sent for cultures at the resection margins.  The bone and soft tissue was covered with Kerecis micro graft and 2 g vancomycin powder.  OPERATIVE PROCEDURE: Patient was brought the operating room and underwent general anesthetic.  After adequate levels anesthesia were obtained patient's left lower extremity was prepped using DuraPrep draped into a sterile field a timeout was called.  An impervious stockinette was used to drape out the lower limb including the necrotic ulcer at the knee.  A  fishmouth incision was made proximal to the patella this was carried sharply down to bone.  The vascular bundles medially were clamped and suture-ligated with 2-0 silk.  The soft tissue amputation was completed a reciprocating saw was used to take a segment out of the distal femur.  Attempt was made to try to remove the intramedullary aspect of the distal femur replacement.  This was unsuccessful.  A bur was then used to make a slot in the distal femur for further attempted resection of the distal femoral intramedullary implant this was unsuccessful so a diamond tipped bur was then used to cut through the femoral implant.  The implant distal femur replacement total knee and leg were sent to pathology.  Electrocardio is used for further hemostasis.  The wound was irrigated with Vashe irrigation.  The wound bed was then filled with 2 g vancomycin powder and 38 cm of Kerecis micro graft.  The deep and superficial fascial layers were closed using #1 Vicryl.  Skin was closed using staples.  A Prevena customizable and Merton Border form wound VAC was applied this had a good suction fit patient was extubated taken the PACU in stable condition.   DISCHARGE PLANNING:  Antibiotic duration: Will plan for Kefzol antibiotics will adjust according to tissue cultures.  Weightbearing: Nonweightbearing on the left  Pain medication: Opioid pathway  Dressing care/ Wound VAC: Wound VAC  Ambulatory devices: Walker  Discharge to: Anticipate discharge to inpatient versus outpatient rehab.  Follow-up: In the office 1 week post operative.

## 2022-11-05 ENCOUNTER — Other Ambulatory Visit: Payer: Self-pay

## 2022-11-05 ENCOUNTER — Encounter (HOSPITAL_COMMUNITY): Payer: Self-pay | Admitting: Orthopedic Surgery

## 2022-11-05 DIAGNOSIS — M86652 Other chronic osteomyelitis, left thigh: Secondary | ICD-10-CM | POA: Diagnosis not present

## 2022-11-05 DIAGNOSIS — Z89612 Acquired absence of left leg above knee: Secondary | ICD-10-CM | POA: Diagnosis not present

## 2022-11-05 LAB — CBC
HCT: 27.8 % — ABNORMAL LOW (ref 36.0–46.0)
Hemoglobin: 8.8 g/dL — ABNORMAL LOW (ref 12.0–15.0)
MCH: 26.6 pg (ref 26.0–34.0)
MCHC: 31.7 g/dL (ref 30.0–36.0)
MCV: 84 fL (ref 80.0–100.0)
Platelets: 305 10*3/uL (ref 150–400)
RBC: 3.31 MIL/uL — ABNORMAL LOW (ref 3.87–5.11)
RDW: 15.8 % — ABNORMAL HIGH (ref 11.5–15.5)
WBC: 8.3 10*3/uL (ref 4.0–10.5)
nRBC: 0 % (ref 0.0–0.2)

## 2022-11-05 LAB — BPAM RBC
Blood Product Expiration Date: 202410162359
Blood Product Expiration Date: 202410162359
ISSUE DATE / TIME: 202409251040
ISSUE DATE / TIME: 202409251040
Unit Type and Rh: 6200
Unit Type and Rh: 6200

## 2022-11-05 LAB — BASIC METABOLIC PANEL
Anion gap: 8 (ref 5–15)
BUN: 7 mg/dL — ABNORMAL LOW (ref 8–23)
CO2: 21 mmol/L — ABNORMAL LOW (ref 22–32)
Calcium: 8.5 mg/dL — ABNORMAL LOW (ref 8.9–10.3)
Chloride: 108 mmol/L (ref 98–111)
Creatinine, Ser: 0.53 mg/dL (ref 0.44–1.00)
GFR, Estimated: >60 mL/min (ref >60)
Glucose, Bld: 116 mg/dL — ABNORMAL HIGH (ref 70–99)
Potassium: 3.4 mmol/L — ABNORMAL LOW (ref 3.5–5.1)
Sodium: 137 mmol/L (ref 135–145)

## 2022-11-05 LAB — TYPE AND SCREEN
ABO/RH(D): AB POS
Antibody Screen: NEGATIVE
Unit division: 0
Unit division: 0

## 2022-11-05 LAB — AEROBIC/ANAEROBIC CULTURE W GRAM STAIN (SURGICAL/DEEP WOUND)
Gram Stain: NONE SEEN
Gram Stain: NONE SEEN

## 2022-11-05 NOTE — Evaluation (Signed)
Physical Therapy Evaluation Patient Details Name: Tammy [name] MRN: [id number] DOB: Dec 29, 1955 Today's Date: 11/05/2022  History of Present Illness  Tammy [name] is a 67 y.o. 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 She was treated with iv amp/sub for 6 wk then on chronic oral amox, however she had developed sinus tract that extendes 6 cm proximal to the [facility] and bone. Decision was made that she should undergo AKA to control infection on 9/25.  Clinical Impression  Pt presents with admitting diagnosis above. Co-treat with OT. Pt today was able to stand pivot transfer to chair with +2 Min A. PTA pt was Mod I with RW. Patient will benefit from intensive inpatient follow up therapy, >3 hours/day as pt is very motivated, can tolerate 3 hours of therapy a day, and has support at home upon DC. PT will continue to follow.       If plan is discharge home, recommend the following: A lot of help with walking and/or transfers;A lot of help with bathing/dressing/bathroom;Assistance with cooking/housework;Assist for transportation;Help with stairs or ramp for entrance   Can travel by private vehicle        Equipment Recommendations Other (comment) (Per accepting facility)  Recommendations for Other Services  Rehab consult    Functional Status Assessment Patient has had a recent decline in their functional status and demonstrates the ability to make significant improvements in function in a reasonable and predictable amount of time.     Precautions / Restrictions Precautions Precautions: Fall      Mobility  Bed Mobility Overal bed mobility: Needs Assistance Bed Mobility: Supine to Sit     Supine to  sit: +2 for physical assistance, Mod assist, Used rails, HOB elevated     General bed mobility comments: Heavy use of rails and bed pads.    Transfers Overall transfer level: Needs assistance Equipment used: Rolling walker (2 wheels) Transfers: Sit to/from Stand, Bed to chair/wheelchair/BSC Sit to Stand: +2 physical assistance, Min assist Stand pivot transfers: +2 physical assistance, Min assist         General transfer comment: Cues for hand placement.    Ambulation/Gait               General Gait Details: unable  Stairs            Wheelchair Mobility     Tilt Bed    Modified Rankin (Stroke Patients Only)       Balance Overall balance assessment: Needs assistance Sitting-balance support: Bilateral upper extremity supported Sitting balance-Leahy Scale: Good     Standing balance support: Bilateral upper extremity supported, During functional activity, Reliant on assistive device for balance Standing balance-Leahy Scale: Poor Standing balance comment: Reliant on RW                             Pertinent Vitals/Pain Pain Assessment Pain Assessment: 0-10 Pain Score: 2  Pain Location: Residual limb Pain Descriptors / Indicators: Discomfort, Grimacing, Sharp, Throbbing Pain Intervention(s): Premedicated before session, Monitored during session    Home Living Family/patient expects to be discharged to:: Private residence Living Arrangements: Spouse/significant other Available Help at Discharge: Family;Friend(s);Available PRN/intermittently Type of Home: Mobile home Home Access: Stairs  to enter Entrance Stairs-Rails: Can reach both;Left;Right Entrance Stairs-Number of Steps: 5   Home Layout: One level Home Equipment: Agricultural consultant (2 wheels);Rollator (4 wheels);Shower seat;Hand held shower head;Toilet riser      Prior Function Prior Level of Function : Independent/Modified Independent             Mobility Comments: Mod I  RW ADLs Comments: independent, used shower seat     Extremity/Trunk Assessment   Upper Extremity Assessment Upper Extremity Assessment: Overall WFL for tasks assessed    Lower Extremity Assessment Lower Extremity Assessment: LLE deficits/detail LLE Deficits / Details: L AKA    Cervical / Trunk Assessment Cervical / Trunk Assessment: Normal  Communication   Communication Communication: No apparent difficulties  Cognition Arousal: Alert Behavior During Therapy: WFL for tasks assessed/performed Overall Cognitive Status: Within Functional Limits for tasks assessed                                          General Comments General comments (skin integrity, edema, etc.): VSS    Exercises     Assessment/Plan    PT Assessment Patient needs continued PT services  PT Problem List Decreased strength;Decreased range of motion;Decreased activity tolerance;Decreased balance;Decreased mobility;Decreased coordination;Decreased knowledge of use of DME;Decreased knowledge of precautions;Decreased safety awareness;Pain       PT Treatment Interventions DME instruction;Gait training;Stair training;Functional mobility training;Therapeutic activities;Therapeutic exercise;Balance training;Neuromuscular re-education;Patient/family education;Wheelchair mobility training    PT Goals (Current goals can be found in the Care Plan section)  Acute Rehab PT Goals Patient Stated Goal: to get better PT Goal Formulation: With patient Time For Goal Achievement: 11/19/22 Potential to Achieve Goals: Fair    Frequency Min 1X/week     Co-evaluation               AM-PAC PT "6 Clicks" Mobility  Outcome Measure Help needed turning from your back to your side while in a flat bed without using bedrails?: A Lot Help needed moving from lying on your back to sitting on the side of a flat bed without using bedrails?: A Lot Help needed moving to and from a bed to a chair (including a  wheelchair)?: A Lot Help needed standing up from a chair using your arms (e.g., wheelchair or bedside chair)?: A Lot Help needed to walk in hospital room?: Total Help needed climbing 3-5 steps with a railing? : Total 6 Click Score: 10    End of Session Equipment Utilized During Treatment: Gait belt Activity Tolerance: Patient tolerated treatment well Patient left: in chair;with call bell/phone within reach;with chair alarm set Nurse Communication: Mobility status;Patient requests pain meds PT Visit Diagnosis: Other abnormalities of gait and mobility (R26.89)    Time: 1027-1050 PT Time Calculation (min) (ACUTE ONLY): 23 min   Charges:   PT Evaluation $PT Eval Moderate Complexity: 1 Mod   PT General Charges $$ ACUTE PT VISIT: 1 Visit         Shela Nevin, PT, DPT Acute Rehab Services [phone]   [name] 11/05/2022, 12:37 PM

## 2022-11-05 NOTE — Progress Notes (Signed)
    [facility]    Date of Admission:  11/04/2022   Total days of antibiotics 2   ID: Tammy [name] is a 67 y.o. female with  POD#1 aka Principal Problem:   S/P AKA (above knee amputation) unilateral, left (HCC) Active Problems:   Infection of total knee replacement (HCC)    Subjective: Afebrile. Pain is improved but has some symptoms consistent with phantom limb pain  Medications:   amLODipine  10 mg Oral Daily   anastrozole  1 mg Oral Daily   vitamin C  1,000 mg Oral Daily   buPROPion  150 mg Oral BID   docusate sodium  100 mg Oral Daily   gabapentin  100 mg Oral QHS   hydrALAZINE  25 mg Oral BID   nutrition supplement (JUVEN)  1 packet Oral BID BM   pantoprazole  40 mg Oral Daily   zinc sulfate  220 mg Oral Daily    Objective: Vital signs in last 24 hours: Temp:  [98.7 F (37.1 C)-100.1 F (37.8 C)] 98.9 F (37.2 C) (09/26 1549) Pulse Rate:  [80-95] 88 (09/26 1549) Resp:  [16-18] 16 (09/26 1549) BP: (153-170)/(74-92) 158/78 (09/26 1549) SpO2:  [94 %-100 %] 98 % (09/26 1549) Physical Exam  Constitutional:  oriented to person, place, and time. appears well-developed and well-nourished. No distress.  HENT: [place]/AT, PERRLA, no scleral icterus Mouth/Throat: Oropharynx is clear and moist. No oropharyngeal exudate.  Cardiovascular: Normal rate, regular rhythm and normal heart sounds. Exam reveals no gallop and no friction rub.  No murmur heard.  Pulmonary/Chest: Effort normal and breath sounds normal. No respiratory distress.  has no wheezes.  Ext: left aka- wound vac in place Skin: Skin is warm and dry. No rash noted. No erythema.  Psychiatric: a normal mood and affect.  behavior is normal.    Lab Results Recent Labs    11/03/22 1502 11/04/22 0758 11/05/22 0429  WBC 5.5  --  8.3  HGB 9.8* 10.9* 8.8*  HCT 31.5* 32.0* 27.8*  NA 143 143 137  K 3.6 3.6 3.4*  CL 109 108 108  CO2 22  --  21*  BUN 15 14 7*  CREATININE 0.80 0.70  0.53   Liver Panel Recent Labs    11/03/22 1502  PROT 7.0  AST 10  ALT 8  BILITOT 0.2   Sedimentation Rate No results for input(s): "ESRSEDRATE" in the last 72 hours. C-Reactive Protein Recent Labs    11/03/22 1502  CRP 9.6*    Microbiology: --- Studies/Results: [place] EKG SITE RITE  Result Date: 11/05/2022 If Site Rite image not attached, placement could not be confirmed due to current cardiac rhythm.    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 Will get picc line today. Awaiting culture results to see if can formulate targeted therapy. Currently on vancomycin and ceftriaxone.  [facility] Pager: [phone]  11/05/2022, 5:10 PM

## 2022-11-05 NOTE — Progress Notes (Signed)
IP rehab admissions - I met with patient at the bedside.  She would like CIR.  She has been to CIR previously.  I gave her rehab booklets and reviewed rehab program.  We will open the case with insurance carrier today and await their response.  Call me for questions.  [phone]

## 2022-11-05 NOTE — Evaluation (Signed)
Occupational Therapy Evaluation Patient Details Name: Tammy [name] MRN: [id number] DOB: 1955/08/07 Today's Date: 11/05/2022   History of Present Illness Tammy [name] is a 67 y.o. 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 She was treated with iv amp/sub for 6 wk then on chronic oral amox, however she had developed sinus tract that extendes 6 cm proximal to the [facility] and bone. Decision was made that she should undergo AKA to control infection on 9/25.   Clinical Impression   Pt s/p above diagnosis. Pt c/o pain at rest, 2/10, increases with movement. Pt PLOF mod I, lives with spouse, 5 steps to enter home. Pt currently requires significant assistance to perform LB ADLs, min Ax2 for safety with standing pivot transfers. Pt has difficulty with bed mobility due to pain in residual extremity when lifting/scooting. Pt would benefit from postacute intensive rehab >3hrs/day to maximize progress prior to return home, good candidate and expected to quickly improve to safe, functional level needed to remain safe at home. Pt to be seen acutely during stay to maximize progress as able.       If plan is discharge home, recommend the following: A little help with walking and/or transfers;A little help with bathing/dressing/bathroom;Assistance with cooking/housework;Assist for transportation;Help with stairs or ramp for entrance    Functional Status Assessment  Patient has had a recent decline in their functional status and demonstrates the ability to make significant improvements in function in a reasonable and predictable amount of time.  Equipment Recommendations  Other (comment) (defer)    Recommendations for Other  Services Rehab consult     Precautions / Restrictions Precautions Precautions: Fall      Mobility Bed Mobility Overal bed mobility: Needs Assistance Bed Mobility: Supine to Sit     Supine to sit: +2 for physical assistance, Mod assist, Used rails, HOB elevated     General bed mobility comments: Heavy use of rails and bed pads.    Transfers Overall transfer level: Needs assistance Equipment used: Rolling walker (2 wheels) Transfers: Sit to/from Stand, Bed to chair/wheelchair/BSC Sit to Stand: +2 physical assistance, Min assist Stand pivot transfers: +2 physical assistance, Min assist         General transfer comment: Cues for hand placement.      Balance Overall balance assessment: Needs assistance Sitting-balance support: Bilateral upper extremity supported Sitting balance-Leahy Scale: Fair Sitting balance - Comments: EOB, difficulty bending/leaning   Standing balance support: Bilateral upper extremity supported, During functional activity, Reliant on assistive device for balance Standing balance-Leahy Scale: Poor Standing balance comment: Reliant on RW                           ADL either performed or assessed with clinical judgement   ADL Overall ADL's : Needs assistance/impaired Eating/Feeding: Set up;Sitting   Grooming: Set up;Sitting   Upper Body Bathing: Set up;Sitting   Lower Body Bathing: Moderate assistance;Sitting/lateral leans   Upper Body Dressing : Set up;Sitting   Lower Body Dressing: Maximal assistance;Sitting/lateral leans   Toilet Transfer: +2 for physical assistance;Minimal assistance   Toileting- Clothing Manipulation and Hygiene: Moderate assistance;Sitting/lateral lean         General ADL Comments: Pt requires assistance for LB  ADLs, difficulty balancing in sitting position,  good overal BUE strength/ROM to complete UB ADLs     Vision         Perception         Praxis         Pertinent Vitals/Pain Pain  Assessment Pain Assessment: 0-10 Pain Score: 2  Pain Location: Residual limb Pain Descriptors / Indicators: Discomfort, Grimacing, Sharp, Throbbing Pain Intervention(s): Monitored during session, Premedicated before session     Extremity/Trunk Assessment Upper Extremity Assessment Upper Extremity Assessment: Overall WFL for tasks assessed   Lower Extremity Assessment Lower Extremity Assessment: Defer to PT evaluation LLE Deficits / Details: L AKA   Cervical / Trunk Assessment Cervical / Trunk Assessment: Normal   Communication Communication Communication: No apparent difficulties   Cognition Arousal: Alert Behavior During Therapy: WFL for tasks assessed/performed Overall Cognitive Status: Within Functional Limits for tasks assessed                                       General Comments  VSS    Exercises     Shoulder Instructions      Home Living Family/patient expects to be discharged to:: Private residence Living Arrangements: Spouse/significant other Available Help at Discharge: Family;Friend(s);Available PRN/intermittently Type of Home: Mobile home Home Access: Stairs to enter Entrance Stairs-Number of Steps: 5 Entrance Stairs-Rails: Can reach both;Left;Right Home Layout: One level     Bathroom Shower/Tub: Chief Strategy Officer: Standard Bathroom Accessibility: Yes   Home Equipment: Agricultural consultant (2 wheels);Rollator (4 wheels);Shower seat;Hand held shower head;Toilet riser          Prior Functioning/Environment Prior Level of Function : Independent/Modified Independent             Mobility Comments: Mod I RW ADLs Comments: independent, used shower seat        OT Problem List: Decreased strength;Decreased range of motion;Decreased activity tolerance;Impaired balance (sitting and/or standing);Decreased knowledge of use of DME or AE;Impaired sensation;Pain      OT Treatment/Interventions: Self-care/ADL  training;Therapeutic exercise;Neuromuscular education;DME and/or AE instruction;Therapeutic activities;Balance training    OT Goals(Current goals can be found in the care plan section) Acute Rehab OT Goals Patient Stated Goal: to manage pain OT Goal Formulation: With patient Time For Goal Achievement: 11/19/22 Potential to Achieve Goals: Good  OT Frequency: Min 1X/week    Co-evaluation              AM-PAC OT "6 Clicks" Daily Activity     Outcome Measure Help from another person eating meals?: None Help from another person taking care of personal grooming?: A Little Help from another person toileting, which includes using toliet, bedpan, or urinal?: A Lot Help from another person bathing (including washing, rinsing, drying)?: A Lot Help from another person to put on and taking off regular upper body clothing?: A Little Help from another person to put on and taking off regular lower body clothing?: A Lot 6 Click Score: 16   End of Session Equipment Utilized During Treatment: Gait belt;Rolling walker (2 wheels) Nurse Communication: Mobility status  Activity Tolerance: Patient tolerated treatment well Patient left: in chair;with call bell/phone within reach  OT Visit Diagnosis: Unsteadiness on feet (R26.81);Other abnormalities of gait and mobility (R26.89);Muscle weakness (generalized) (M62.81);Pain Pain - Right/Left: Left Pain - part of body: Hip                Time:  1027-1050 OT Time Calculation (min): 23 min Charges:  OT General Charges $OT Visit: 1 Visit OT Evaluation $OT Eval Moderate Complexity: 1 [address] OTR/L   Alexis Goodell 11/05/2022, 1:46 PM

## 2022-11-05 NOTE — Plan of Care (Signed)
  Problem: Education: Goal: Knowledge of the prescribed therapeutic regimen will improve Outcome: Progressing Goal: Individualized Educational Video(s) Outcome: Progressing   

## 2022-11-05 NOTE — Plan of Care (Signed)
  Problem: Education: Goal: Knowledge of the prescribed therapeutic regimen will improve Outcome: Progressing Goal: Individualized Educational Video(s) Outcome: Progressing   Problem: Pain Management: Goal: Pain level will decrease with appropriate interventions Outcome: Progressing   Problem: Skin Integrity: Goal: Will show signs of wound healing Outcome: Progressing

## 2022-11-05 NOTE — PMR Pre-admission (Addendum)
PMR Admission Coordinator Pre-Admission Assessment  Patient: Tammy [name] is an 67 y.o., female MRN: [id number] DOB: Apr 20, 1955 Height: 5' 2.5" (158.8 cm) Weight: 68.5 kg              Insurance Information HMO: YES    PPO:      PCP:      IPA:      80/20:      OTHER: POS group [id number] PRIMARY: UHC Medicare      Policy#: [id number] and medicare # [id number]   Subscriber: patient CM Name: Myrlene Broker      Phone#: [phone]   Fax#: [phone] Pre-Cert#: [id number] approved from 11/12/22 to 11/18/22 with update due 11/18/22       Employer: Retired Benefits:  Phone #: [phone]     Name: [email or web address] on line portal Eff. Date: 02/09/22     Deduct: $0      Out of Pocket Max: $3600 (met $3600)      Life Max: n/a  CIR: $295 for days 1-5      SNF: 40 for days 1-20; $203 for days 21-100 Outpatient: med nec     Co-Pay: $20/visit Home Health: 100%      Co-Pay: none DME: 80%     Co-Pay: 20% Providers: in network  SECONDARY:       Policy#:       Phone#:   Artist:       Phone#:   The Data processing manager" for patients in Inpatient Rehabilitation Facilities with attached "Privacy Act Statement-Health Care Records" was provided and verbally reviewed with: Patient  Emergency Contact Information Contact Information     Name Relation Home Work Mobile   Branch,[name] Significant other [phone]  [phone]   Christy Sartorius Daughter [phone]  [phone]   Lovvorn,Loressa Daughter [phone]  [phone]   Sherlon Handing Sister   [phone]      Other Contacts   None on File    Current Medical History  Patient Admitting Diagnosis: L AKA  History of Present Illness: A 67 y.o. 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 [name].  PT/OT evaluations completed with recommendations for acute inpatient rehab admission.   Patient's medical record from [facility] has been reviewed by the rehabilitation admission coordinator and physician.  Past Medical History  Past Medical History:  Diagnosis Date   Anemia    hx of   Anxiety    on meds   Arthritis    back/bilateral ankles   Bipolar disorder (HCC)    Breast cancer (HCC) 2022   LEFT breast-radiation   Depression    on meds   Family history of breast cancer 07/10/2020   Family history of colon cancer 07/10/2020   Family history of ovarian cancer 07/10/2020   GERD (gastroesophageal reflux disease)    on meds   Heart murmur    dx by PCP   Hepatitis C    HTN (hypertension)    on meds   Hx of  radiation therapy    Liver cirrhosis (HCC)    from hep c   Neuromuscular disorder (HCC)    Osteopenia    Personal history of radiation therapy    Stroke (HCC) 09/2012   slurred speech and LEFT sided weakness/notes 10/04/2012    Has the patient had major surgery during 100 days prior to admission? Yes  Family History  family history includes Breast cancer in her maternal aunt, mother, and another family member; Colon cancer in an other family member; Colon cancer (age of onset: 75) in her maternal aunt; Diabetes in her brother, paternal aunt, and sister; Neuropathy in her brother and sister; Other in her mother; Ovarian cancer in an other family member.   Current Medications   Current Facility-Administered Medications:    acetaminophen (TYLENOL) tablet 325-650 mg, 325-650 mg, Oral, Q6H PRN, Nadara Mustard, MD, 325 mg at 11/13/22 1045   alum & mag  hydroxide-simeth (MAALOX/MYLANTA) 200-200-20 MG/5ML suspension 15-30 mL, 15-30 mL, Oral, Q2H PRN, Nadara Mustard, MD   amLODipine (NORVASC) tablet 10 mg, 10 mg, Oral, Daily, Nadara Mustard, MD, 10 mg at 11/13/22 8295   anastrozole (ARIMIDEX) tablet 1 mg, 1 mg, Oral, Daily, Nadara Mustard, MD, 1 mg at 11/13/22 6213   ascorbic acid (VITAMIN C) tablet 1,000 mg, 1,000 mg, Oral, Daily, Nadara Mustard, MD, 1,000 mg at 11/13/22 0865   bisacodyl (DULCOLAX) EC tablet 5 mg, 5 mg, Oral, Daily PRN, Nadara Mustard, MD, 5 mg at 11/10/22 7846   buPROPion [facility]) 12 hr tablet 150 mg, 150 mg, Oral, BID, Nadara Mustard, MD, 150 mg at 11/13/22 9629   cefTRIAXone (ROCEPHIN) 2 g in sodium chloride 0.9 % 100 mL IVPB, 2 g, Intravenous, Q24H, Judyann Munson, MD, Last Rate: 200 mL/hr at 11/12/22 1746, 2 g at 11/12/22 1746   Chlorhexidine Gluconate Cloth 2 % PADS 6 each, 6 each, Topical, Daily, Nadara Mustard, MD, 6 each at 11/13/22 0809   docusate sodium (COLACE) capsule 100 mg, 100 mg, Oral, Daily, Nadara Mustard, MD, 100 mg at 11/13/22 5284   gabapentin (NEURONTIN) capsule 100 mg, 100 mg, Oral, QHS, Nadara Mustard, MD, 100 mg at 11/12/22 2112   guaiFENesin-dextromethorphan (ROBITUSSIN DM) 100-10 MG/5ML syrup 15 mL, 15 mL, Oral, Q4H PRN, Nadara Mustard, MD   hydrALAZINE (APRESOLINE) injection 5 mg, 5 mg, Intravenous, Q20 Min PRN, Nadara Mustard, MD   hydrALAZINE (APRESOLINE) tablet 25 mg, 25 mg, Oral, BID, Nadara Mustard, MD, 25 mg at 11/13/22 1324   HYDROmorphone (DILAUDID) injection 0.5-1 mg, 0.5-1 mg, Intravenous, Q4H PRN, Nadara Mustard, MD, 1 mg at 11/13/22 0059   labetalol (NORMODYNE) injection 10 mg, 10 mg, Intravenous, Q10 min PRN, Nadara Mustard, MD, 10 mg at 11/13/22 0452   magnesium citrate solution 1 Bottle, 1 Bottle, Oral, Once PRN, Nadara Mustard, MD   magnesium sulfate IVPB 2 g 50 mL, 2 g, Intravenous, Daily PRN, Nadara Mustard, MD   metoprolol tartrate (LOPRESSOR) injection 2-5 mg, 2-5 mg,  Intravenous, Q2H PRN, Nadara Mustard, MD   nutrition supplement (JUVEN) (JUVEN) powder packet 1 packet, 1 packet, Oral, BID BM, Nadara Mustard, MD, 1 packet at 11/13/22 0806   ondansetron [facility]) injection 4 mg, 4 mg, Intravenous, Q6H PRN, Nadara Mustard, MD   oxyCODONE (Oxy IR/ROXICODONE) immediate release tablet 10-15 mg, 10-15 mg, Oral, Q4H PRN, Nadara Mustard, MD, 15 mg at 11/13/22 0446   oxyCODONE (Oxy IR/ROXICODONE) immediate  release tablet 5 mg, 5 mg, Oral, Q4H PRN, Nadara Mustard, MD, 5 mg at 11/06/22 0917   pantoprazole (PROTONIX) EC tablet 40 mg, 40 mg, Oral, Daily, Nadara Mustard, MD, 40 mg at 11/13/22 0807   phenol (CHLORASEPTIC) mouth spray 1 spray, 1 spray, Mouth/Throat, PRN, Nadara Mustard, MD   polyethylene glycol (MIRALAX / GLYCOLAX) packet 17 g, 17 g, Oral, Daily PRN, Nadara Mustard, MD, 17 g at 11/13/22 1041   sodium chloride flush (NS) 0.9 % injection 10-40 mL, 10-40 mL, Intracatheter, PRN, Nadara Mustard, MD   vancomycin (VANCOREADY) IVPB 750 mg/150 mL, 750 mg, Intravenous, Q12H, Rollene Fare, RPH, Last Rate: 150 mL/hr at 11/13/22 0815, 750 mg at 11/13/22 0815   zinc sulfate capsule 220 mg, 220 mg, Oral, Daily, Nadara Mustard, MD, 220 mg at 11/13/22 0807  Patients Current Diet:  Diet Order             Diet - low sodium heart healthy           Diet regular Room service appropriate? Yes; Fluid consistency: Thin  Diet effective now                   Precautions / Restrictions Precautions Precautions: Fall Restrictions Weight Bearing Restrictions: Yes LLE Weight Bearing: Non weight bearing   Has the patient had 2 or more falls or a fall with injury in the past year?No  Prior Activity Level Limited Community (1-2x/wk): Has been out limited amounts most recently.  Prior Functional Level Prior Function Prior Level of Function : Independent/Modified Independent Mobility Comments: Mod I RW ADLs Comments: independent, used shower seat  Self Care: Did the  patient need help bathing, dressing, using the toilet or eating?  Needed some help  Indoor Mobility: Did the patient need assistance with walking from room to room (with or without device)? Needed some help  Stairs: Did the patient need assistance with internal or external stairs (with or without device)? Needed some help  Functional Cognition: Did the patient need help planning regular tasks such as shopping or remembering to take medications? Independent  Patient Information Are you of Hispanic, Latino/a,or Spanish origin?: A. No, not of Hispanic, Latino/a, or Spanish origin What is your race?: B. Black or African American Do you need or want an interpreter to communicate with a doctor or health care staff?: 0. No  Patient's Response To:  Health Literacy and Transportation Is the patient able to respond to health literacy and transportation needs?: Yes Health Literacy - How often do you need to have someone help you when you read instructions, pamphlets, or other written material from your doctor or pharmacy?: Never In the past 12 months, has lack of transportation kept you from medical appointments or from getting medications?: No In the past 12 months, has lack of transportation kept you from meetings, work, or from getting things needed for daily living?: No  Home Assistive Devices / Equipment Home Equipment: Agricultural consultant (2 wheels), Rollator (4 wheels), Shower seat, Hand held shower head, Toilet riser  Prior Device Use: Indicate devices/aids used by the patient prior to current illness, exacerbation or injury? Walker Designer, jewellery  Overall Cognitive Status: Within Functional Limits for tasks assessed Orientation Level: Oriented X4, Oriented to person, Oriented to place, Oriented to time, Oriented to situation    Extremity Assessment (includes Sensation/Coordination)  Upper Extremity Assessment: Overall WFL for tasks assessed  Lower  Extremity Assessment: Defer  to PT evaluation LLE Deficits / Details: L AKA    ADLs  Overall ADL's : Needs assistance/impaired Eating/Feeding: Set up, Sitting Grooming: Contact guard assist, Standing, Oral care, Wash/dry face Grooming Details (indicate cue type and reason): standing initally, transitioned to sitting on BSC when fatigued Upper Body Bathing: Set up, Sitting Lower Body Bathing: Moderate assistance, Sitting/lateral leans Upper Body Dressing : Set up, Sitting Lower Body Dressing: Maximal assistance, Sit to/from stand Lower Body Dressing Details (indicate cue type and reason): pt requires assist threading L LE, managing clothing in standing Toilet Transfer: Minimal assistance, Ambulation, Rolling walker (2 wheels), BSC/3in1 Toilet Transfer Details (indicate cue type and reason): 3:1 over toilet Toileting- Clothing Manipulation and Hygiene: Minimal assistance, Sit to/from stand Toileting - Clothing Manipulation Details (indicate cue type and reason): using 1 handed technique, able to complete hygiene in standing and lateral leans Functional mobility during ADLs: Minimal assistance, Rolling walker (2 wheels) General ADL Comments: pt completing ADLs at sink after toileting needs, fatigues with prolonged standing and has to transition to sitting    Mobility  Overal bed mobility: Needs Assistance Bed Mobility: Sit to Supine Supine to sit: HOB elevated, Used rails, Min assist Sit to supine: Contact guard assist, HOB elevated, Used rails General bed mobility comments: increased time required, cueing for technique to fully bring L LE back in bed    Transfers  Overall transfer level: Needs assistance Equipment used: Rolling walker (2 wheels) Transfers: Sit to/from Stand Sit to Stand: Contact guard assist, Min assist Bed to/from chair/wheelchair/BSC transfer type:: Step pivot Stand pivot transfers: +2 physical assistance, Min assist Step pivot transfers: Min assist General transfer  comment: x3. Initially CGA however Min A once fatigued.    Ambulation / Gait / Stairs / Wheelchair Mobility  Ambulation/Gait Ambulation/Gait assistance: Contact guard assist, Min assist, +2 safety/equipment Gait Distance (Feet): 65 Feet (40+25) Assistive device: Rolling walker (2 wheels) Gait Pattern/deviations:  (hop to) General Gait Details: Chair follow for safety and +2 line management. 1 seated rest break however no overt LOB. Pt mostly CGA however Min A once fatigued. Gait velocity: decreased    Posture / Balance Dynamic Sitting Balance Sitting balance - Comments: dynamically Balance Overall balance assessment: Needs assistance Sitting-balance support: No upper extremity supported, Feet supported, Single extremity supported Sitting balance-Leahy Scale: Fair Sitting balance - Comments: dynamically Standing balance support: Bilateral upper extremity supported, During functional activity, Single extremity supported Standing balance-Leahy Scale: Poor Standing balance comment: relies on at least 1 UE support during ADLS    Special needs/care consideration  IV with PICC in place will need Vancomycin 750mg  IV q12 hours AND Ceftriaxone 2g IV Q24H  End date: 12/15/22.  Has VAC and dressing to post op right AKA site.    Previous Home Environment (from acute therapy documentation) Living Arrangements: Spouse/significant other Available Help at Discharge: Family, Friend(s), Available PRN/intermittently Type of Home: Mobile home Home Layout: One level Home Access: Stairs to enter Entrance Stairs-Rails: Can reach both, Left, Right Entrance Stairs-Number of Steps: 5 Bathroom Shower/Tub: Associate Professor: Yes Home Care Services: No ([facility] for dressing changes)  Discharge Living Setting Plans for Discharge Living Setting: Mobile Home, Lives with (comment) (Lives with fiance) Type of Home at Discharge: Mobile home (Single  wide.) Discharge Home Layout: One level Discharge Home Access: Stairs to enter Entrance Stairs-Rails: Right, Left, Can reach both Entrance Stairs-Number of Steps: 5 Discharge Bathroom Shower/Tub: Tub/shower unit, Curtain Discharge Bathroom Toilet: Standard (Has an over the  toilet riser seat for elevated toilet.) Discharge Bathroom Accessibility: Yes How Accessible: Accessible via wheelchair, Accessible via walker Does the patient have any problems obtaining your medications?: No  Social/Family/Support Systems Patient Roles: Parent (Has SO and daughter.) Contact Information: Lynden Oxford - significant other Anticipated Caregiver: [name] and family Ability/Limitations of Caregiver: [name] is retired by works odd jobs. Caregiver Availability: 24/7 Discharge Plan Discussed with Primary Caregiver: Yes Is Caregiver In Agreement with Plan?: Yes Does Caregiver/Family have Issues with Lodging/Transportation while Pt is in Rehab?: No  Goals Patient/Family Goal for Rehab: PT/OT supervision to mod I goals Expected length of stay: 7-10 days Pt/Family Agrees to Admission and willing to participate: Yes Program Orientation Provided & Reviewed with Pt/Caregiver Including Roles  & Responsibilities: Yes  Decrease burden of Care through IP rehab admission: N/A  Possible need for SNF placement upon discharge: Not anticipated  Patient Condition: I have reviewed medical records from [facility], spoken with CM, and patient. I met with patient at the bedside for inpatient rehabilitation assessment.  Patient will benefit from ongoing PT and OT, can actively participate in 3 hours of therapy a day 5 days of the week, and can make measurable gains during the admission.  Patient will also benefit from the coordinated team approach during an Inpatient Acute Rehabilitation admission.  The patient will receive intensive therapy as well as Rehabilitation physician, nursing, social worker, and care management  interventions.  Due to bladder management, bowel management, safety, skin/wound care, disease management, medication administration, pain management, and patient education the patient requires 24 hour a day rehabilitation nursing.  The patient is currently contact guard assist to min assist with mobility and basic ADLs.  Discharge setting and therapy post discharge at home with home health is anticipated.  Patient has agreed to participate in the Acute Inpatient Rehabilitation Program and will admit today.  Diagnosis: left AKA Does the need for close, 24 hr/day medical supervision in concert with the patient's rehab needs make it unreasonable for this patient to be served in a less intensive setting? Yes Co-Morbidities requiring supervision/potential complications:  -infected left leg/fracture -pain considerations -wound care Due to bladder management, bowel management, safety, skin/wound care, disease management, medication administration, pain management, and patient education, does the patient require 24 hr/day rehab nursing? Yes Does the patient require coordinated care of a physician, rehab nurse, therapy disciplines of PT, OT to address physical and functional deficits in the context of the above medical diagnosis(es)? Yes Addressing deficits in the following areas: balance, endurance, locomotion, strength, transferring, bowel/bladder control, bathing, dressing, feeding, grooming, toileting, and psychosocial support Can the patient actively participate in an intensive therapy program of at least 3 hrs of therapy per day at least 5 days per week? Yes The potential for patient to make measurable gains while on inpatient rehab is excellent Anticipated functional outcomes upon discharge from inpatient rehab are modified independent  with PT, modified independent with OT at w/c level Estimated rehab length of stay to reach the above functional goals is: 7-10 days Anticipated discharge destination:  Home Overall Rehab/Functional Prognosis: excellent Ranelle Oyster, MD 11/13/2022.  Preadmission Screen Completed By:  Trish Mage, RN, 11/13/2022 11:06 AM ______________________________________________________________________   Discussed status with Dr. Riley Kill on10/04/24 at 1000 and received approval for admission today.  Admission Coordinator:  Trish Mage, time1109/Date10/04/24

## 2022-11-05 NOTE — Progress Notes (Signed)
Patient ID: Tammy [name], female   DOB: [birth year], 67 y.o.   MRN: [id number] Patient is postoperative day 1 above-the-knee amputation.  Patient states she feels better but does have phantom sensations.  Plan for discharge to skilled nursing.  Cultures of bone and soft tissue are pending from the amputation site.  Patient did have application of Kerecis micro graft with vancomycin powder for soft tissue reinforcement and prophylactic antibiotic coverage.

## 2022-11-06 LAB — BASIC METABOLIC PANEL
Anion gap: 18 — ABNORMAL HIGH (ref 5–15)
BUN: 9 mg/dL (ref 8–23)
CO2: 24 mmol/L (ref 22–32)
Calcium: 9.2 mg/dL (ref 8.9–10.3)
Chloride: 98 mmol/L (ref 98–111)
Creatinine, Ser: 0.58 mg/dL (ref 0.44–1.00)
GFR, Estimated: >60 mL/min (ref >60)
Glucose, Bld: 109 mg/dL — ABNORMAL HIGH (ref 70–99)
Potassium: 3 mmol/L — ABNORMAL LOW (ref 3.5–5.1)
Sodium: 140 mmol/L (ref 135–145)

## 2022-11-06 LAB — CBC
HCT: 33 % — ABNORMAL LOW (ref 36.0–46.0)
Hemoglobin: 10.4 g/dL — ABNORMAL LOW (ref 12.0–15.0)
MCH: 27.6 pg (ref 26.0–34.0)
MCHC: 31.5 g/dL (ref 30.0–36.0)
MCV: 87.5 fL (ref 80.0–100.0)
Platelets: 355 10*3/uL (ref 150–400)
RBC: 3.77 MIL/uL — ABNORMAL LOW (ref 3.87–5.11)
RDW: 16.1 % — ABNORMAL HIGH (ref 11.5–15.5)
WBC: 9.2 10*3/uL (ref 4.0–10.5)
nRBC: 0 % (ref 0.0–0.2)

## 2022-11-06 LAB — SURGICAL PATHOLOGY

## 2022-11-06 NOTE — Progress Notes (Signed)
Physical Therapy Treatment Patient Details Name: Esraa Brautigam MRN: [id number] DOB: 05-Apr-1955 Today's Date: 11/06/2022   History of Present Illness Sahalie Janiyia Burkard is a 67 y.o. 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 Decision was made that she should undergo AKA to control infection on 9/25.    PT Comments  Pt tolerated treatment well today. Pt able to increased ambulation distance in room today with RW +2 Min A. No change in DC/DME recs at this time. PT will continue to follow.    If plan is discharge home, recommend the following: A lot of help with walking and/or transfers;A lot of help with bathing/dressing/bathroom;Assistance with cooking/housework;Assist for transportation;Help with stairs or ramp for entrance   Can travel by private vehicle        Equipment Recommendations  Other (comment) (Per accepting facility)    Recommendations for Other Services       Precautions / Restrictions Precautions Precautions: Fall Restrictions Weight Bearing Restrictions: No     Mobility  Bed Mobility Overal bed mobility: Needs Assistance Bed Mobility: Supine to Sit     Supine to sit: Mod assist, Used rails, HOB elevated     General bed mobility comments: Heavy use of bed pads. Pt able to sit up on her own.    Transfers Overall transfer level: Needs assistance Equipment used: Rolling walker (2 wheels) Transfers: Sit to/from Stand Sit to Stand: +2 physical assistance, Min assist           General transfer comment: Cues for hand  placement.    Ambulation/Gait Ambulation/Gait assistance: +2 physical assistance, Min assist Gait Distance (Feet): 5 Feet Assistive device: Rolling walker (2 wheels) Gait Pattern/deviations:  (hop to) Gait velocity: decreased     General Gait Details: Pt able to hop to the chair. no overt LOB noted. Pt also able to stand for prolonged time while therapist untangled IV line.   Stairs             Wheelchair Mobility     Tilt Bed    Modified Rankin (Stroke Patients Only)       Balance Overall balance assessment: Needs assistance Sitting-balance support: Bilateral upper extremity supported Sitting balance-Leahy Scale: Fair Sitting balance - Comments: EOB, difficulty bending/leaning   Standing balance support: Bilateral upper extremity supported, During functional activity, Reliant on assistive device for balance Standing balance-Leahy Scale: Poor Standing balance comment: Reliant on RW                            Cognition Arousal: Alert Behavior During Therapy: WFL for tasks assessed/performed Overall Cognitive Status: Within Functional Limits for tasks assessed                                          Exercises      General Comments General comments (skin integrity, edema, etc.): VSS      Pertinent Vitals/Pain Pain Assessment Pain Assessment: Faces  Faces Pain Scale: Hurts little more Pain Location: Residual limb Pain Descriptors / Indicators: Discomfort, Grimacing, Sharp, Throbbing Pain Intervention(s): Monitored during session, Premedicated before session    Home Living                          Prior Function            PT Goals (current goals can now be found in the care plan section) Progress towards PT goals: Progressing toward goals    Frequency    Min 1X/week      PT Plan      Co-evaluation              AM-PAC PT "6 Clicks" Mobility   Outcome Measure  Help needed turning from your  back to your side while in a flat bed without using bedrails?: A Lot Help needed moving from lying on your back to sitting on the side of a flat bed without using bedrails?: A Lot Help needed moving to and from a bed to a chair (including a wheelchair)?: A Lot Help needed standing up from a chair using your arms (e.g., wheelchair or bedside chair)?: A Lot Help needed to walk in hospital room?: Total Help needed climbing 3-5 steps with a railing? : Total 6 Click Score: 10    End of Session Equipment Utilized During Treatment: Gait belt Activity Tolerance: Patient tolerated treatment well Patient left: in chair;with call bell/phone within reach;with chair alarm set Nurse Communication: Mobility status PT Visit Diagnosis: Other abnormalities of gait and mobility (R26.89)     Time: 1017-1030 PT Time Calculation (min) (ACUTE ONLY): 13 min  Charges:    $Therapeutic Activity: 8-22 mins PT General Charges $$ ACUTE PT VISIT: 1 Visit                     Shela Nevin, PT, DPT Acute Rehab Services [phone]    [name] 11/06/2022, 1:57 PM

## 2022-11-06 NOTE — Plan of Care (Signed)
  Problem: Education: Goal: Knowledge of the prescribed therapeutic regimen will improve Outcome: Progressing Goal: Individualized Educational Video(s) Outcome: Progressing   Problem: Activity: Goal: Ability to avoid complications of mobility impairment will improve Outcome: Progressing Goal: Range of joint motion will improve Outcome: Progressing   Problem: Clinical Measurements: Goal: Postoperative complications will be avoided or minimized Outcome: Progressing   Problem: Pain Management: Goal: Pain level will decrease with appropriate interventions Outcome: Progressing   Problem: Skin Integrity: Goal: Will show signs of wound healing Outcome: Progressing   Problem: Education: Goal: Knowledge of the prescribed therapeutic regimen will improve Outcome: Progressing Goal: Ability to verbalize activity precautions or restrictions will improve Outcome: Progressing Goal: Understanding of discharge needs will improve Outcome: Progressing   Problem: Activity: Goal: Ability to perform//tolerate increased activity and mobilize with assistive devices will improve Outcome: Progressing   Problem: Clinical Measurements: Goal: Postoperative complications will be avoided or minimized Outcome: Progressing   Problem: Self-Care: Goal: Ability to meet self-care needs will improve Outcome: Progressing   Problem: Self-Concept: Goal: Ability to maintain and perform role responsibilities to the fullest extent possible will improve Outcome: Progressing   Problem: Pain Management: Goal: Pain level will decrease with appropriate interventions Outcome: Progressing   Problem: Skin Integrity: Goal: Demonstration of wound healing without infection will improve Outcome: Progressing   Problem: Education: Goal: Knowledge of General Education information will improve Description: Including pain rating scale, medication(s)/side effects and non-pharmacologic comfort measures Outcome:  Progressing   Problem: Health Behavior/Discharge Planning: Goal: Ability to manage health-related needs will improve Outcome: Progressing   Problem: Clinical Measurements: Goal: Ability to maintain clinical measurements within normal limits will improve Outcome: Progressing Goal: Will remain free from infection Outcome: Progressing Goal: Diagnostic test results will improve Outcome: Progressing Goal: Respiratory complications will improve Outcome: Progressing Goal: Cardiovascular complication will be avoided Outcome: Progressing   Problem: Activity: Goal: Risk for activity intolerance will decrease Outcome: Progressing   Problem: Nutrition: Goal: Adequate nutrition will be maintained Outcome: Progressing   Problem: Coping: Goal: Level of anxiety will decrease Outcome: Progressing   Problem: Elimination: Goal: Will not experience complications related to bowel motility Outcome: Progressing Goal: Will not experience complications related to urinary retention Outcome: Progressing   Problem: Pain Managment: Goal: General experience of comfort will improve Outcome: Progressing   Problem: Safety: Goal: Ability to remain free from injury will improve Outcome: Progressing   Problem: Skin Integrity: Goal: Risk for impaired skin integrity will decrease Outcome: Progressing

## 2022-11-06 NOTE — Progress Notes (Signed)
Inpatient Rehab Admissions Coordinator:    Cir following. Continue to await insurance auth for CIR.   Megan Salon, MS, CCC-SLP Rehab Admissions Coordinator  [phone] (celll) [phone] (office)

## 2022-11-06 NOTE — Progress Notes (Signed)
Patient ID: Tammy [name], female   DOB: [birth year], 67 y.o.   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  Soft tissue cultures are negative for 2 days at the amputation margin.  Patient awaiting insurance authorization for CIR.

## 2022-11-06 NOTE — Care Management Important Message (Signed)
Important Message  Patient Details  Name: Tammy [name] MRN: [id number] Date of Birth: 1955-12-15   Important Message Given:  Yes - Medicare IM     Sherilyn Banker 11/06/2022, 2:54 PM

## 2022-11-06 NOTE — Progress Notes (Signed)
Mobility Specialist: Progress Note   11/06/22 1512  Mobility  Activity Transferred from chair to bed  Level of Assistance +2 (takes two people) Audiological scientist)  Location manager Ambulated (ft) 4 ft  Activity Response Tolerated well  Mobility Referral Yes  $Mobility charge 1 Mobility  Mobility Specialist Start Time (ACUTE ONLY) 1455  Mobility Specialist Stop Time (ACUTE ONLY) 1507  Mobility Specialist Time Calculation (min) (ACUTE ONLY) 12 min    Pt was agreeable to mobility session - received in chair. MinA +2 throughout. Left in bed with all needs met, call bell in reach.   Maurene Capes Mobility Specialist Please contact via SecureChat or Rehab office at [phone]

## 2022-11-07 DIAGNOSIS — T847XXD Infection and inflammatory reaction due to other internal orthopedic prosthetic devices, implants and grafts, subsequent encounter: Secondary | ICD-10-CM

## 2022-11-07 MED ORDER — CHLORHEXIDINE GLUCONATE CLOTH 2 % EX PADS
6.0000 | MEDICATED_PAD | Freq: Every day | CUTANEOUS | Status: DC
  Administered 2022-11-07 – 2022-11-13 (×7): 6 via TOPICAL

## 2022-11-07 MED ORDER — SODIUM CHLORIDE 0.9% FLUSH: 10.0000 mL | INTRAVENOUS | Status: DC | PRN

## 2022-11-07 NOTE — Progress Notes (Signed)
Peripherally Inserted Central Catheter Placement  The IV Nurse has discussed with the patient and/or persons authorized to consent for the patient, the purpose of this procedure and the potential benefits and risks involved with this procedure.  The benefits include less needle sticks, lab draws from the catheter, and the patient may be discharged home with the catheter. Risks include, but not limited to, infection, bleeding, blood clot (thrombus formation), and puncture of an artery; nerve damage and irregular heartbeat and possibility to perform a PICC exchange if needed/ordered by physician.  Alternatives to this procedure were also discussed.  Bard Power PICC patient education guide, fact sheet on infection prevention and patient information card has been provided to patient /or left at bedside.    PICC Placement Documentation  PICC Single Lumen 11/07/22 Right Basilic 35 cm 1 cm (Active)  Indication for Insertion or Continuance of Line Prolonged intravenous therapies 11/07/22 1004  Exposed Catheter (cm) 0 cm 11/07/22 1004  Site Assessment Clean, Dry, Intact 11/07/22 1004  Line Status Saline locked;Blood return noted 11/07/22 1004  Dressing Type Transparent;Securing device 11/07/22 1004  Dressing Status Antimicrobial disc in place;Clean, Dry, Intact 11/07/22 1004  Line Care Connections checked and tightened 11/07/22 1004  Line Adjustment (NICU/IV Team Only) No 11/07/22 1004  Dressing Intervention New dressing;Adhesive placed at insertion site (IV team only) 11/07/22 1004  Dressing Change Due 11/14/22 11/07/22 1004       Burnard Bunting Chenice 11/07/2022, 10:06 AM

## 2022-11-07 NOTE — Progress Notes (Signed)
   11/07/22 1046  Mobility  Activity Ambulated with assistance in room  Level of Assistance +2 (takes two people) Audiological scientist)  Location manager Ambulated (ft) 20 ft  Activity Response Tolerated fair  Mobility Referral Yes  $Mobility charge 1 Mobility  Mobility Specialist Start Time (ACUTE ONLY) 1015  Mobility Specialist Stop Time (ACUTE ONLY) 1045  Mobility Specialist Time Calculation (min) (ACUTE ONLY) 30 min   Mobility Specialist: Progress Note  Pt agreeable to mobility session - received in bed. Required MinA throughout using RW. C/o phantom pain and sharp pain when pulling herself up previously. Pt in pain throughout session shown through facial grimaces. RN present in room. Returned to chair with all needs met - call bell within reach.   Barnie Mort, BS Mobility Specialist Please contact via SecureChat or Rehab office at [phone].

## 2022-11-07 NOTE — Progress Notes (Signed)
Subjective:  The patient is coping well  Antibiotics:  Anti-infectives (From admission, onward)    Start     Dose/Rate Route Frequency Ordered Stop   11/05/22 1830  vancomycin (VANCOREADY) IVPB 1250 mg/250 mL        1,250 mg 166.7 mL/hr over 90 Minutes Intravenous Every 24 hours 11/04/22 1720     11/04/22 1830  vancomycin (VANCOREADY) IVPB 1500 mg/300 mL        1,500 mg 150 mL/hr over 120 Minutes Intravenous  Once 11/04/22 1720 11/04/22 2044   11/04/22 1700  cefTRIAXone (ROCEPHIN) 2 g in sodium chloride 0.9 % 100 mL IVPB        2 g 200 mL/hr over 30 Minutes Intravenous Every 24 hours 11/04/22 1612     11/04/22 1500  ceFAZolin (ANCEF) IVPB 2g/100 mL premix  Status:  Discontinued        2 g 200 mL/hr over 30 Minutes Intravenous Every 8 hours 11/04/22 1400 11/04/22 1612   11/04/22 1022  vancomycin (VANCOCIN) powder  Status:  Discontinued          As needed 11/04/22 1022 11/04/22 1128   11/04/22 0825  ceFAZolin (ANCEF) IVPB 2g/100 mL premix        2 g 200 mL/hr over 30 Minutes Intravenous On call to O.R. 11/04/22 0726 11/04/22 1024       Medications: Scheduled Meds:  amLODipine  10 mg Oral Daily   anastrozole  1 mg Oral Daily   vitamin C  1,000 mg Oral Daily   buPROPion  150 mg Oral BID   Chlorhexidine Gluconate Cloth  6 each Topical Daily   docusate sodium  100 mg Oral Daily   gabapentin  100 mg Oral QHS   hydrALAZINE  25 mg Oral BID   nutrition supplement (JUVEN)  1 packet Oral BID BM   pantoprazole  40 mg Oral Daily   zinc sulfate  220 mg Oral Daily   Continuous Infusions:  cefTRIAXone (ROCEPHIN)  IV 2 g (11/06/22 1831)   magnesium sulfate bolus IVPB     vancomycin 1,250 mg (11/06/22 1826)   PRN Meds:.acetaminophen, alum & mag hydroxide-simeth, bisacodyl, guaiFENesin-dextromethorphan, hydrALAZINE, HYDROmorphone (DILAUDID) injection, labetalol, magnesium citrate, magnesium sulfate bolus IVPB, metoprolol tartrate, ondansetron, oxyCODONE, oxyCODONE, phenol,  polyethylene glycol, potassium chloride, sodium chloride flush    Objective: Weight change:   Intake/Output Summary (Last 24 hours) at 11/07/2022 1200 Last data filed at 11/07/2022 0900 Gross per 24 hour  Intake 760 ml  Output 1000 ml  Net -240 ml   Blood pressure (!) 113/98, pulse 69, temperature 98.8 F (37.1 C), temperature source Oral, resp. rate 15, height 5' 2.5" (1.588 m), weight 68.5 kg, SpO2 100%. Temp:  [98 F (36.7 C)-99.2 F (37.3 C)] 98.8 F (37.1 C) (09/28 0548) Pulse Rate:  [69-85] 69 (09/28 0758) Resp:  [15-16] 15 (09/28 0548) BP: (113-153)/(71-98) 113/98 (09/28 0758) SpO2:  [100 %] 100 % (09/28 0548)  Physical Exam: Physical Exam Constitutional:      General: She is not in acute distress.    Appearance: She is well-developed. She is not diaphoretic.  HENT:     Head: Normocephalic and atraumatic.     Right Ear: External ear normal.     Left Ear: External ear normal.     Mouth/Throat:     Pharynx: No oropharyngeal exudate.  Eyes:     General: No scleral icterus.    Conjunctiva/sclera: Conjunctivae normal.  Pupils: Pupils are equal, round, and reactive to light.  Cardiovascular:     Rate and Rhythm: Normal rate and regular rhythm.     Heart sounds: Normal heart sounds. No murmur heard.    No friction rub. No gallop.  Pulmonary:     Effort: Pulmonary effort is normal. No respiratory distress.     Breath sounds: Normal breath sounds. No wheezing or rales.  Abdominal:     General: Bowel sounds are normal. There is no distension.     Palpations: Abdomen is soft.     Tenderness: There is no abdominal tenderness. There is no rebound.  Lymphadenopathy:     Cervical: No cervical adenopathy.  Skin:    General: Skin is warm and dry.     Coloration: Skin is not pale.     Findings: No erythema or rash.  Neurological:     General: No focal deficit present.     Mental Status: She is alert and oriented to person, place, and time.     Motor: No abnormal  muscle tone.     Coordination: Coordination normal.  Psychiatric:        Mood and Affect: Mood normal.        Behavior: Behavior normal.        Thought Content: Thought content normal.        Judgment: Judgment normal.     AKA site bandaged  CBC:    BMET Recent Labs    11/05/22 0429 11/06/22 1040  NA 137 140  K 3.4* 3.0*  CL 108 98  CO2 21* 24  GLUCOSE 116* 109*  BUN 7* 9  CREATININE 0.53 0.58  CALCIUM 8.5* 9.2     Liver Panel  No results for input(s): "PROT", "ALBUMIN", "AST", "ALT", "ALKPHOS", "BILITOT", "BILIDIR", "IBILI" in the last 72 hours.     Sedimentation Rate No results for input(s): "ESRSEDRATE" in the last 72 hours. C-Reactive Protein No results for input(s): "CRP" in the last 72 hours.  Micro Results: Recent Results (from the past 720 hour(s))  Aerobic/Anaerobic Culture w Gram Stain (surgical/deep wound)     Status: None (Preliminary result)   Collection Time: 11/04/22 10:24 AM   Specimen: PATH Amputaion Arm/Leg; Tissue  Result Value Ref Range Status   Specimen Description TISSUE AMP ARM LEG LEFT  Final   Special Requests A  Final   Gram Stain NO WBC SEEN NO ORGANISMS SEEN   Final   Culture   Final    NO GROWTH 2 DAYS NO ANAEROBES ISOLATED; CULTURE IN PROGRESS FOR 5 DAYS Performed at [facility], [address], [place], [place] [address]    Report Status PENDING  Incomplete  Aerobic/Anaerobic Culture w Gram Stain (surgical/deep wound)     Status: None (Preliminary result)   Collection Time: 11/04/22 10:32 AM   Specimen: Bone; Tissue  Result Value Ref Range Status   Specimen Description BONE LEFT LEG ABOVE KNEE  Final   Special Requests B  Final   Gram Stain NO WBC SEEN NO ORGANISMS SEEN   Final   Culture   Final    NO GROWTH 2 DAYS NO ANAEROBES ISOLATED; CULTURE IN PROGRESS FOR 5 DAYS Performed at [facility], [address], [place], [place] [address]    Report Status PENDING  Incomplete  Surgical pcr screen      Status: None   Collection Time: 11/04/22  5:15 PM   Specimen: Nasal Mucosa; Nasal Swab  Result Value Ref  Range Status   MRSA, PCR NEGATIVE NEGATIVE Final   Staphylococcus aureus NEGATIVE NEGATIVE Final    Comment: (NOTE) The Xpert SA Assay (FDA approved for NASAL specimens in patients 35 years of age and older), is one component of a comprehensive surveillance program. It is not intended to diagnose infection nor to guide or monitor treatment. Performed at [facility], [address], [place], [place] [address]     Studies/Results: No results found.    Assessment/Plan:  INTERVAL HISTORY: Cultures unrevealing   Principal Problem:   S/P AKA (above knee amputation) unilateral, left (HCC) Active Problems:   Infection of total knee replacement (HCC)    Tammy [name] is a 67 y.o. female with her associated osteomyelitis now status above-the-knee amputation but still with some retained hardware.  Cultures have been unrevealing.  PICC is in place  We will plan on 6 weeks of IV antibiotics with vancomycin and ceftriaxone   Diagnosis: Hardware associated osteomyelitis   Culture Result: no growth  Allergies  Allergen Reactions   Lisinopril Cough    OPAT Orders Discharge antibiotics to be given via PICC line Discharge antibiotics: Tract send 2 g IV every 24 hours, vancomycin per pharmacy Aim for Vancomycin trough 15-20 or AUC 400-550 (unless otherwise indicated) Duration: 6 weeks End Date: November 5th, 2024  [facility] Care Per Protocol:  Home health RN for IV administration and teaching; PICC line care and labs.    Labs BI-weekly while on IV antibiotics:  _x_ BMP  x__ Vancomycin trough  Labs weekly while on IV antibiotics: _x_ CBC with differential  _x_ CRP _x_ ESR  x__ Please pull PIC at completion of IV antibiotics __ Please leave PIC in place until doctor has seen patient or been notified  Fax weekly labs to [phone]  Clinic  Follow Up Appt:   Tammy [name] has an appointment on 12/01/2022 at 9AM with Dr. Renold Don  at  [facility] for Infectious Disease, which  is located in the [facility] at  [address] in [place].  [address], which is located to the left of the elevators.  Phone: [phone]  Fax: [phone]  [email or web address]  The patient should arrive 30 minutes prior to their appoitment.  I have personally spent 52 minutes involved in face-to-face and non-face-to-face activities for this patient on the day of the visit. Professional time spent includes the following activities: Preparing to see the patient (review of tests), Obtaining and/or reviewing separately obtained history (admission/discharge record), Performing a medically appropriate examination and/or evaluation , Ordering medications/tests/procedures, referring and communicating with other health care professionals, Documenting clinical information in the EMR, Independently interpreting results (not separately reported), Communicating results to the patient/family/caregiver, Counseling and educating the patient/family/caregiver and Care coordination (not separately reported).    I will followup the culture data and then take her off our rounding list when cultures are final.    LOS: 3 days   Tammy [name] 11/07/2022, 12:00 PM

## 2022-11-07 NOTE — Progress Notes (Addendum)
Patient ID: Tammy [name], female   DOB: 06/28/1955, 67 y.o.   MRN: [id number] Patient is status post above-knee amputation on the left.  Cultures are pending negative for 2 days.    Discussed with patient she may use Tylenol with her oxycodone to help with pain.    Anticipate discharge to inpatient rehab.  There is no drainage in the wound VAC canister.

## 2022-11-07 NOTE — Progress Notes (Signed)
PHARMACY CONSULT NOTE FOR:  OUTPATIENT  PARENTERAL ANTIBIOTIC THERAPY (OPAT)  Indication: Osteomyelitis s/p AKA with retained hardware  Regimen: Vancomycin 1,250 mg IV Q24H AND Ceftriaxone 2g IV Q24H  End date: 12/15/22   IV antibiotic discharge orders are pended. To discharging provider:  please sign these orders via discharge navigator,  Select New Orders & click on the button choice - Manage This Unsigned Work.   BMET and Vancomycin trough bi-weekly CBC with diff, CRP, ESR weekly  Pull PICC at completion of IV antibiotics    Thank you for allowing pharmacy to be a part of this patient's care.  Jani Gravel, PharmD Clinical Pharmacist  11/07/2022 1:37 PM

## 2022-11-07 NOTE — Progress Notes (Signed)
Pharmacy Antibiotic Note  Tammy [name] is a 67 y.o. female admitted on 11/04/2022 with osteomyelitis of left femur. S/P AKA today though some of the intramedullary implant was unable to be resected.  Pharmacy has been consulted for vancomycin. ID following and plans for antibiotics until 12/15/22  -WBC 9.2, afebrile, SCr ~ 0.6 -cultures- ngtd  Plan: Continue Vancomycin 1250 mg every 24 hours Predicted AUC 521 with SCr 0.8  Also on ceftriaxone 2 gm every 24 hours Monitor culture data and renal function  Check vancomycin levels early next week  Height: 5' 2.5" (158.8 cm) Weight: 68.5 kg (151 lb) IBW/kg (Calculated) : 51.25  Temp (24hrs), Avg:98.7 F (37.1 C), Min:98 F (36.7 C), Max:99.2 F (37.3 C)  Recent Labs  Lab 11/03/22 1502 11/04/22 0758 11/05/22 0429 11/06/22 1040  WBC 5.5  --  8.3 9.2  CREATININE 0.80 0.70 0.53 0.58    Estimated Creatinine Clearance: 61.8 mL/min (by C-G formula based on SCr of 0.58 mg/dL).    Allergies  Allergen Reactions   Lisinopril Cough      Thank you for allowing pharmacy to be a part of this patient's care.  Harland German, PharmD Clinical Pharmacist **Pharmacist phone directory can now be found on [email or web address] ([email or web address]).  Listed under [facility].

## 2022-11-07 NOTE — Plan of Care (Signed)
  Problem: Education: Goal: Knowledge of the prescribed therapeutic regimen will improve Outcome: Progressing Goal: Individualized Educational Video(s) Outcome: Progressing   

## 2022-11-07 NOTE — Progress Notes (Signed)
   11/07/22 1500  Mobility  Activity Ambulated with assistance to bathroom;Transferred from chair to bed  Level of Assistance +2 (takes two people) Tax inspector)  Assistive Device Front wheel walker  Distance Ambulated (ft) 20 ft  Activity Response Tolerated fair  Mobility Referral Yes  $Mobility charge 1 Mobility  Mobility Specialist Start Time (ACUTE ONLY) 1515  Mobility Specialist Stop Time (ACUTE ONLY) 1530  Mobility Specialist Time Calculation (min) (ACUTE ONLY) 15 min   Mobility Specialist: Progress Note  Pt agreeable to mobility session - received in chair. Required MinA throughout using RW. Pt in pain throughout session shown through facial grimaces. RN present in room. Returned to bed with all needs met - call bell within reach.    Barnie Mort, BS Mobility Specialist Please contact via SecureChat or Rehab office at [phone].

## 2022-11-08 NOTE — Progress Notes (Signed)
   11/08/22 1121  Mobility  Activity Ambulated with assistance in room  Level of Assistance Minimal assist, patient does 75% or more  Assistive Device Front wheel walker  Distance Ambulated (ft) 30 ft  Activity Response Tolerated fair  Mobility Referral Yes  $Mobility charge 1 Mobility  Mobility Specialist Start Time (ACUTE ONLY) 1102  Mobility Specialist Stop Time (ACUTE ONLY) 1119  Mobility Specialist Time Calculation (min) (ACUTE ONLY) 17 min   Mobility Specialist: Progress Note  Pt agreeable to mobility session - received in bed. Required MinA throughout using RW. C/o pain in LLE rated 8/10. Returned to chair with all needs met - call bell within reach. RN present.  Barnie Mort, BS Mobility Specialist Please contact via SecureChat or Rehab office at [phone].

## 2022-11-08 NOTE — Progress Notes (Signed)
   11/08/22 1500  Mobility  Activity Ambulated with assistance to bathroom;Transferred from chair to bed  Level of Assistance Minimal assist, patient does 75% or more  Assistive Device Front wheel walker  Distance Ambulated (ft) 20 ft  Activity Response Tolerated fair  Mobility Referral Yes  $Mobility charge 1 Mobility  Mobility Specialist Start Time (ACUTE ONLY) 1446  Mobility Specialist Stop Time (ACUTE ONLY) 1509  Mobility Specialist Time Calculation (min) (ACUTE ONLY) 23 min   Mobility Specialist: Progress Note   Pt agreeable to mobility session - received in chair. Required MinA throughout using RW. Pt in pain throughout session shown through facial grimaces. RN notified. Returned to bed with all needs met - call bell within reach.    Barnie Mort, BS Mobility Specialist Please contact via SecureChat or Rehab office at [phone].

## 2022-11-08 NOTE — Plan of Care (Signed)
  Problem: Education: Goal: Knowledge of the prescribed therapeutic regimen will improve Outcome: Progressing Goal: Individualized Educational Video(s) Outcome: Progressing   

## 2022-11-08 NOTE — Progress Notes (Signed)
Patient ID: Tammy [name], female   DOB: 01-17-1956, 67 y.o.   MRN: [id number] Patient is status post above-knee amputation on the left.    Pain is controlled. Anticipate discharge to inpatient rehab.  There is no drainage in the wound VAC canister.

## 2022-11-08 NOTE — Progress Notes (Signed)
INFECTIOUS DISEASE ATTENDING ADDENDUM:   Date: 11/08/2022  Patient name: Tammy [name]  Medical record number: [id number]  Date of birth: March 09, 1955   I will follow-up her culture data I do not expect she is going to grow any organisms at this point.  I will remove her from our rounding list but again follow-up her culture data and 2 days time to make sure that I have followed up on her finalized cultures.  Please call with further questions.    Acey Lav 11/08/2022, 12:28 PM

## 2022-11-09 LAB — VANCOMYCIN, PEAK: Vancomycin Pk: 35 ug/mL (ref 30–40)

## 2022-11-09 LAB — AEROBIC/ANAEROBIC CULTURE W GRAM STAIN (SURGICAL/DEEP WOUND)
Culture: NO GROWTH
Culture: NO GROWTH

## 2022-11-09 LAB — VANCOMYCIN, TROUGH: Vancomycin Tr: 5 ug/mL — ABNORMAL LOW (ref 15–20)

## 2022-11-09 MED ORDER — VANCOMYCIN HCL 750 MG/150ML IV SOLN
750.0000 mg | Freq: Two times a day (BID) | INTRAVENOUS | Status: DC
  Administered 2022-11-10 – 2022-11-13 (×7): 750 mg via INTRAVENOUS
  Filled 2022-11-09 (×7): qty 150

## 2022-11-09 NOTE — Progress Notes (Signed)
Occupational Therapy Treatment Patient Details Name: Tammy [name] MRN: [id number] DOB: September 28, 1955 Today's Date: 11/09/2022   History of present illness Tammy [name] is a 66 y.o. 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 She was treated with iv amp/sub for 6 wk then on chronic oral amox, however she had developed sinus tract that extendes 6 cm proximal to the [facility] and bone. Decision was made that she should undergo AKA to control infection on 9/25.   OT comments  Patient progressing well towards OT goals.  She is completing transfers with min assist using RW, but increased assist with bed mobility today due to pain in L LE.  She requires mod to max assist for toileting and LB dressing.  Reviewed compensatory techniques, recommendations and phantom pain mgmt- but pt reports still unable to look at her limb.  Highly motivated and anticipate she will continue to progress well.  Will follow acutely, and continue to recommend >3hrs/day inpatient setting.        If plan is discharge home, recommend the following:  A little help with walking and/or transfers;Assistance with cooking/housework;Assist for transportation;Help with stairs or ramp for entrance;A lot of help with bathing/dressing/bathroom   Equipment Recommendations  Other (comment) (defer)    Recommendations for Other Services Rehab consult    Precautions / Restrictions Precautions Precautions: Fall Restrictions Weight Bearing Restrictions: Yes LLE Weight Bearing: Non weight bearing       Mobility Bed Mobility Overal bed mobility: Needs Assistance Bed Mobility: Supine to Sit     Supine to sit: Max assist, Used rails, HOB elevated      General bed mobility comments: increased assist due to pain in L LE    Transfers Overall transfer level: Needs assistance Equipment used: Rolling walker (2 wheels) Transfers: Sit to/from Stand, Bed to chair/wheelchair/BSC Sit to Stand: Min assist     Step pivot transfers: Min assist     General transfer comment: min assist to power up and steady from EOB and BSC, cueing for hand placement and increased time required     Balance Overall balance assessment: Needs assistance Sitting-balance support: Bilateral upper extremity supported Sitting balance-Leahy Scale: Fair     Standing balance support: Bilateral upper extremity supported, Single extremity supported, During functional activity Standing balance-Leahy Scale: Poor Standing balance comment: Reliant on RW                           ADL either performed or assessed with clinical judgement   ADL Overall ADL's : Needs assistance/impaired     Grooming: Set up;Sitting           Upper Body Dressing : Set up;Sitting   Lower Body Dressing: Maximal assistance;Sit to/from stand Lower Body Dressing Details (indicate cue type and reason): pt requires assist threading L LE, managing clothing in standing Toilet Transfer: Minimal assistance;Ambulation;Rolling walker (2 wheels) Toilet Transfer Details (indicate cue type and reason): to [facility] Toileting- Clothing Manipulation and Hygiene: Moderate assistance;Sit to/from stand Toileting - Clothing Manipulation Details (indicate cue type and reason): using 1 handed technique, able to complete hygiene in standing     Functional mobility during ADLs: Minimal assistance;Rolling walker (2 wheels);Cueing for safety  Extremity/Trunk Assessment              Vision       Perception     Praxis      Cognition Arousal: Alert Behavior During Therapy: WFL for tasks assessed/performed Overall Cognitive Status: Within Functional Limits for tasks assessed                                           Exercises      Shoulder Instructions       General Comments VSS    Pertinent Vitals/ Pain       Pain Assessment Pain Assessment: Faces Faces Pain Scale: Hurts even more Pain Location: Residual limb Pain Descriptors / Indicators: Discomfort, Grimacing, Sharp, Throbbing Pain Intervention(s): Limited activity within patient's tolerance, Monitored during session, Repositioned, RN gave pain meds during session  Home Living                                          Prior Functioning/Environment              Frequency  Min 1X/week        Progress Toward Goals  OT Goals(current goals can now be found in the care plan section)  Progress towards OT goals: Progressing toward goals  Acute Rehab OT Goals Patient Stated Goal: get to rehab OT Goal Formulation: With patient Time For Goal Achievement: 11/19/22 Potential to Achieve Goals: Good  Plan      Co-evaluation                 AM-PAC OT "6 Clicks" Daily Activity     Outcome Measure   Help from another person eating meals?: None Help from another person taking care of personal grooming?: None Help from another person toileting, which includes using toliet, bedpan, or urinal?: A Lot Help from another person bathing (including washing, rinsing, drying)?: A Lot Help from another person to put on and taking off regular upper body clothing?: A Little Help from another person to put on and taking off regular lower body clothing?: A Lot 6 Click Score: 17    End of Session Equipment Utilized During Treatment: Gait belt;Rolling walker (2 wheels)  OT Visit Diagnosis: Unsteadiness on feet (R26.81);Other abnormalities of gait and mobility (R26.89);Muscle weakness (generalized) (M62.81);Pain Pain - Right/Left: Left Pain - part of body: Hip   Activity Tolerance Patient tolerated treatment well   Patient Left in chair;with call bell/phone within reach  (PT present)   Nurse Communication Mobility status        Time: 5284-1324 OT Time Calculation (min): 26 min  Charges: OT General Charges $OT Visit: 1 Visit OT Treatments $Self Care/Home Management : 23-37 mins  Barry Brunner, OT Acute Rehabilitation Services Office [phone]   Chancy Milroy 11/09/2022, 12:13 PM

## 2022-11-09 NOTE — Progress Notes (Signed)
Mobility Specialist: Progress Note   11/09/22 1604  Mobility  Activity Transferred from chair to bed  Level of Assistance Contact guard assist, steadying assist  Assistive Device Front wheel walker  LLE Weight Bearing NWB  Activity Response Tolerated well  Mobility Referral Yes  $Mobility charge 1 Mobility  Mobility Specialist Start Time (ACUTE ONLY) 1546  Mobility Specialist Stop Time (ACUTE ONLY) 1559  Mobility Specialist Time Calculation (min) (ACUTE ONLY) 13 min    Pt was agreeable to mobility session - received in chair. Light minA for STS, CG for ambulation and bed mobility. Had c/o LLE pain - requested pain meds. Left in bed with all needs met, call bell in reach. Nurse's [employer] notified.   Maurene Capes Mobility Specialist Please contact via SecureChat or Rehab office at [phone]

## 2022-11-09 NOTE — Progress Notes (Signed)
Pharmacy Antibiotic Note  Tammy [name] is a 67 y.o. female admitted on 11/04/2022 with osteomyelitis of left femur. S/P AKA 9/25 though some of the intramedullary implant was unable to be resected.  Pharmacy has been consulted for vancomycin. ID following and plans for antibiotics until 12/15/22  Vancomycin peak = 35 Vancomycin trough = 5 Calculated AUC 379  Plan: Adjust Vancomycin to 750mg  IV q12h - next dose tomorrow at 10:00 Predicted AUC 455 with Cmin 11.1 Recheck levels at new steady state Also on ceftriaxone 2 gm every 24 hours Monitor culture data and renal function  Check vancomycin levels early next week  Height: 5' 2.5" (158.8 cm) Weight: 68.5 kg (151 lb) IBW/kg (Calculated) : 51.25  Temp (24hrs), Avg:98.6 F (37 C), Min:98.2 F (36.8 C), Max:99.2 F (37.3 C)  Recent Labs  Lab 11/03/22 1502 11/04/22 0758 11/05/22 0429 11/06/22 1040 11/09/22 1816 11/09/22 2023  WBC 5.5  --  8.3 9.2  --   --   CREATININE 0.80 0.70 0.53 0.58  --   --   VANCOTROUGH  --   --   --   --  5*  --   VANCOPEAK  --   --   --   --   --  35    Estimated Creatinine Clearance: 61.8 mL/min (by C-G formula based on SCr of 0.58 mg/dL).    Allergies  Allergen Reactions   Lisinopril Cough    Thank you for allowing pharmacy to be a part of this patient's care.  Loralee Pacas, PharmD, BCPS 11/09/2022 9:51 PM  **Pharmacist phone directory can now be found on [email or web address] ([email or web address]).  Listed under [facility].

## 2022-11-09 NOTE — Plan of Care (Signed)
  Problem: Education: Goal: Knowledge of the prescribed therapeutic regimen will improve Outcome: Progressing Goal: Individualized Educational Video(s) Outcome: Progressing   

## 2022-11-09 NOTE — Progress Notes (Signed)
Patient ID: Tammy [name], female   DOB: Mar 07, 1955, 67 y.o.   MRN: [id number] Patient is making good progress with therapy.  Cultures are negative at 4 days.  Awaiting authorization for inpatient rehab.  Anticipate removing the wound VAC dressing at time of discharge.

## 2022-11-09 NOTE — H&P (Signed)
Physical Medicine and Rehabilitation Admission H&P   CC: Functional deficits secondary to left AKA  HPI: Tammy [name] is a 67 year old female who underwent open reduction of her distal femur fracture April 10, 2021.  Patient eventually had the hardware removed in March 2024 with irrigation and debridement with Dr. [name] 3 separate times as well as distal femur replacement with total knee arthroplasty.  Patient has undergone 2 wound debridements and application of myriad with Dr. Ulice Bold on May 6 and August 5. She continued to have purulent drainage in the wound vac; and the wound did not heal with significant pain in the infected area. Skin graft trial previously from plastic surgery failed as well. The hardware remained exposed. She underwent left AKA by Dr. [name] on 11/04/2022. Wound VAC removed 10/02. The amputation site wound cultures remain negative. She will continue on vancomycin and ceftriaxone through 11/05 and follow with ID. Tolerating diet. She continues to progress ambulation in the hallway with RW CGA/Min A. The patient requires inpatient medicine and rehabilitation evaluations and services for ongoing dysfunction secondary to left AKA.  OOB to chair. Requesting to return to pre-admission dose of gabapentin to 300 mg q HS.    Review of Systems  Constitutional:  Negative for chills and fever.  HENT:  Negative for hearing loss and sore throat.   Eyes:  Negative for blurred vision and double vision.  Respiratory:  Negative for cough and shortness of breath.   Cardiovascular:  Negative for chest pain and palpitations.  Gastrointestinal:  Negative for abdominal pain and nausea.  Genitourinary:  Negative for dysuria and urgency.  Musculoskeletal:  Positive for myalgias.       Complaning of phantom pain: burning knee pain and ankle cramps  Neurological:  Negative for dizziness and headaches.  Psychiatric/Behavioral:  Negative for depression. The patient is not nervous/anxious.     Past Medical History:  Diagnosis Date   Anemia    hx of   Anxiety    on meds   Arthritis    back/bilateral ankles   Bipolar disorder (HCC)    Breast cancer (HCC) 2022   LEFT breast-radiation   Depression    on meds   Family history of breast cancer 07/10/2020   Family history of colon cancer 07/10/2020   Family history of ovarian cancer 07/10/2020   GERD (gastroesophageal reflux disease)    on meds   Heart murmur    dx by PCP   Hepatitis C    HTN (hypertension)    on meds   Hx of radiation therapy    Liver cirrhosis (HCC)    from hep c   Neuromuscular disorder (HCC)    Osteopenia    Personal history of radiation therapy    Stroke (HCC) 09/2012   slurred speech and LEFT sided weakness/notes 10/04/2012   Past Surgical History:  Procedure Laterality Date   AMPUTATION Left 11/04/2022   Procedure: LEFT ABOVE KNEE AMPUTATION;  Surgeon: Nadara Mustard, MD;  Location: [facility] OR;  Service: Orthopedics;  Laterality: Left;   ANKLE FRACTURE SURGERY Left 2018   APPLICATION OF WOUND VAC Left 06/15/2022   Procedure: APPLICATION OF WOUND VAC;  Surgeon: Peggye Form, DO;  Location: [facility] ORS;  Service: Plastics;  Laterality: Left;   APPLICATION OF WOUND VAC Left 09/14/2022   Procedure: APPLICATION OF WOUND VAC;  Surgeon: Peggye Form, DO;  Location: [facility] OR;  Service: Plastics;  Laterality: Left;   BREAST LUMPECTOMY Left 07/2020   BREAST  LUMPECTOMY WITH RADIOACTIVE SEED AND SENTINEL LYMPH NODE BIOPSY Left 07/26/2020   Procedure: LEFT BREAST LUMPECTOMY WITH RADIOACTIVE SEED AND SENTINEL LYMPH NODE BIOPSY;  Surgeon: Abigail Miyamoto, MD;  Location: [facility];  Service: General;  Laterality: Left;   CESAREAN SECTION  1986   COLONOSCOPY     DIAGNOSTIC LAPAROSCOPY  1985   HARDWARE REMOVAL Left 04/30/2022   Procedure: HARDWARE REMOVAL LEFT FEMUR;  Surgeon: Samson Frederic, MD;  Location: [facility];  Service: Orthopedics;  Laterality: Left;  150   HARDWARE REMOVAL Left  11/04/2022   Procedure: HARDWARE REMOVAL INFECTED LEFT TOTAL KNEE ARTHROPLASTY;  Surgeon: Nadara Mustard, MD;  Location: [facility] OR;  Service: Orthopedics;  Laterality: Left;   I & D KNEE WITH POLY EXCHANGE Left 06/07/2022   Procedure: IRRIGATION AND DEBRIDEMENT KNEE WITH POLY EXCHANGE;  Surgeon: Samson Frederic, MD;  Location: [facility];  Service: Orthopedics;  Laterality: Left;   INCISION AND DRAINAGE OF WOUND Left 06/04/2022   Procedure: IRRIGATION AND DEBRIDEMENT KNEE;  Surgeon: Samson Frederic, MD;  Location: [facility];  Service: Orthopedics;  Laterality: Left;  60   INCISION AND DRAINAGE OF WOUND Left 06/15/2022   Procedure: excision of knee wound with Myriad;  Surgeon: Peggye Form, DO;  Location: [facility];  Service: Plastics;  Laterality: Left;   ORIF FEMUR FRACTURE Left 04/10/2021   Procedure: OPEN REDUCTION INTERNAL FIXATION (ORIF) DISTAL FEMUR FRACTURE;  Surgeon: Myrene Galas, MD;  Location: [facility] OR;  Service: Orthopedics;  Laterality: Left;   PARTIAL KNEE ARTHROPLASTY Left 04/30/2022   Procedure: DISTAL FEMUR REPLACEMENT;  Surgeon: Samson Frederic, MD;  Location: [facility];  Service: Orthopedics;  Laterality: Left;  150   Family History  Problem Relation Age of Onset   Breast cancer Mother    Other Mother        blood clots   Diabetes Sister    Neuropathy Sister        diabetic neuropathy   Breast cancer Maternal Aunt        61   Colon cancer Maternal Aunt 43   Diabetes Paternal Aunt    Diabetes Brother    Neuropathy Brother        diabetic neuropathy   Breast cancer Other        PGM's sister; dx mid 41s   Colon cancer Other        PGM's brother; dx 65s   Ovarian cancer Other        PGM's mother; dx unknown age   Esophageal cancer Neg Hx    Rectal cancer Neg Hx    Stomach cancer Neg Hx    Colon polyps Neg Hx    Social History:  reports that she has quit smoking. Her smoking use included cigarettes. She has a 60 pack-year smoking history. She has never used smokeless tobacco.  She reports that she does not currently use alcohol. She reports that she does not use drugs. Allergies:  Allergies  Allergen Reactions   Lisinopril Cough   Medications Prior to Admission  Medication Sig Dispense Refill   acetaminophen (TYLENOL) 500 MG tablet Take 2 tablets (1,000 mg total) by mouth every 8 (eight) hours as needed for moderate pain, mild pain, fever or headache. 30 tablet 0   amLODipine (NORVASC) 10 MG tablet Take 1 tablet (10 mg total) by mouth daily. 30 tablet 0   amoxicillin (AMOXIL) 500 MG capsule Take 2 capsules (1,000 mg total) by mouth 3 (three) times daily. 180 capsule 11  anastrozole (ARIMIDEX) 1 MG tablet TAKE 1 TABLET BY MOUTH DAILY 90 tablet 3   Ascorbic Acid (VITAMIN C) 1000 MG tablet Take 1,000 mg by mouth daily.     buPROPion (WELLBUTRIN SR) 150 MG 12 hr tablet Take 150 mg by mouth 2 (two) times daily.     calcium carbonate (OS-CAL - DOSED IN MG OF ELEMENTAL CALCIUM) 1250 (500 Ca) MG tablet Take 1 tablet by mouth daily.     Cyanocobalamin (VITAMIN B-12 PO) Take 2,000 mcg by mouth daily.     gabapentin (NEURONTIN) 100 MG capsule Take 1 capsule (100 mg total) by mouth at bedtime. For hot flushes 30 capsule 1   hydrALAZINE (APRESOLINE) 25 MG tablet Take 1 tablet (25 mg total) by mouth 2 (two) times daily. 60 tablet 0   HYDROcodone-acetaminophen (NORCO) 10-325 MG tablet Take 1 tablet by mouth every 4 (four) hours as needed for severe pain or moderate pain.     meloxicam (MOBIC) 15 MG tablet Take 1 tablet (15 mg total) by mouth daily. (Patient taking differently: Take 15 mg by mouth daily as needed for pain.) 30 tablet 2   Menthol, Topical Analgesic, (BENGAY EX) Apply 1 Application topically daily as needed (pain).     pantoprazole (PROTONIX) 40 MG tablet Take 1 tablet (40 mg total) by mouth daily. 30 tablet 0   potassium chloride (KLOR-CON) 10 MEQ tablet Take 10 mEq by mouth daily.     Prenatal Vit-Fe Fumarate-FA (PRENATAL PO) Take 1 tablet by mouth daily.      promethazine (PHENERGAN) 12.5 MG tablet Take 1 tablet (12.5 mg total) by mouth every 8 (eight) hours as needed for nausea or vomiting. 20 tablet 1   zinc gluconate 50 MG tablet Take 50 mg by mouth daily.        Home: Home Living Family/patient expects to be discharged to:: Private residence Living Arrangements: Spouse/significant other Available Help at Discharge: Family, Friend(s), Available PRN/intermittently Type of Home: Mobile home Home Access: Stairs to enter Secretary/administrator of Steps: 5 Entrance Stairs-Rails: Can reach both, Left, Right Home Layout: One level Bathroom Shower/Tub: Engineer, manufacturing systems: Standard Bathroom Accessibility: Yes Home Equipment: Agricultural consultant (2 wheels), Rollator (4 wheels), Shower seat, Hand held shower head, Toilet riser   Functional History: Prior Function Prior Level of Function : Independent/Modified Independent Mobility Comments: Mod I RW ADLs Comments: independent, used shower seat  Functional Status:  Mobility: Bed Mobility Overal bed mobility: Needs Assistance Bed Mobility: Supine to Sit Supine to sit: Mod assist, Used rails, HOB elevated General bed mobility comments: Heavy use of bed pads. Pt able to sit up on her own. Transfers Overall transfer level: Needs assistance Equipment used: Rolling walker (2 wheels) Transfers: Sit to/from Stand Sit to Stand: +2 physical assistance, Min assist Bed to/from chair/wheelchair/BSC transfer type:: Stand pivot Stand pivot transfers: +2 physical assistance, Min assist General transfer comment: Cues for hand placement. Ambulation/Gait Ambulation/Gait assistance: +2 physical assistance, Min assist Gait Distance (Feet): 5 Feet Assistive device: Rolling walker (2 wheels) Gait Pattern/deviations:  (hop to) General Gait Details: Pt able to hop to the chair. no overt LOB noted. Pt also able to stand for prolonged time while therapist untangled IV line. Gait velocity: decreased     ADL: ADL Overall ADL's : Needs assistance/impaired Eating/Feeding: Set up, Sitting Grooming: Set up, Sitting Upper Body Bathing: Set up, Sitting Lower Body Bathing: Moderate assistance, Sitting/lateral leans Upper Body Dressing : Set up, Sitting Lower Body Dressing: Maximal assistance, Sitting/lateral leans Toilet  Transfer: +2 for physical assistance, Minimal assistance Toileting- Clothing Manipulation and Hygiene: Moderate assistance, Sitting/lateral lean General ADL Comments: Pt requires assistance for LB ADLs, difficulty balancing in sitting position,  good overal BUE strength/ROM to complete UB ADLs  Cognition: Cognition Overall Cognitive Status: Within Functional Limits for tasks assessed Orientation Level: Oriented X4 Cognition Arousal: Alert Behavior During Therapy: WFL for tasks assessed/performed Overall Cognitive Status: Within Functional Limits for tasks assessed  Physical Exam: Blood pressure (!) 150/72, pulse 64, temperature 98.2 F (36.8 C), temperature source Oral, resp. rate 18, height 5' 2.5" (1.588 m), weight 68.5 kg, SpO2 100%. Physical Exam Constitutional:      General: She is not in acute distress.    Appearance: Normal appearance.  HENT:     Head: Normocephalic and atraumatic.     Mouth/Throat:     Mouth: Mucous membranes are moist.  Eyes:     Extraocular Movements: Extraocular movements intact.     Conjunctiva/sclera: Conjunctivae normal.     Pupils: Pupils are equal, round, and reactive to light.  Cardiovascular:     Rate and Rhythm: Normal rate and regular rhythm.     Heart sounds: No murmur heard.    No gallop.  Pulmonary:     Effort: Pulmonary effort is normal. No respiratory distress.     Breath sounds: Normal breath sounds. No wheezing.  Abdominal:     General: Bowel sounds are normal. There is no distension.     Palpations: Abdomen is soft.     Tenderness: There is no abdominal tenderness.  Genitourinary:    Comments: Clear, pale  yellow urine Musculoskeletal:     Cervical back: Normal range of motion.     Comments: Left AKA swollen and tender to touch. Mild crepitus in right knee with PROM, but no pain or effusions appreciated, normal alignment  Skin:    Comments: Left AKA incision dressed with minimal drainage visible underneath perforated dressing. Has foam dressing over the anterior portion of thigh protecting some abraded areas.   Neurological:     Mental Status: She is alert.     Comments: Alert and oriented x 3. Normal insight and awareness. Intact Memory. Normal language and speech. Cranial nerve exam unremarkable. MMT: UE 5/5. RLE 5/5. LLE 2/5 d/t pain. Sensory exam normal for light touch and pain in all 4 limbs. No limb ataxia or cerebellar signs. No abnormal tone appreciated.  [name]    Psychiatric:        Mood and Affect: Mood normal.        Behavior: Behavior normal.     No results found for this or any previous visit (from the past 48 hour(s)). No results found.    Blood pressure (!) 150/72, pulse 64, temperature 98.2 F (36.8 C), temperature source Oral, resp. rate 18, height 5' 2.5" (1.588 m), weight 68.5 kg, SpO2 100%.  Medical Problem List and Plan: 1. 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  Antithrombotics: -DVT/anticoagulation:  Pharmaceutical: Lovenox>>start -no pharmacologic DVT prophy on acute: check BLE venous duplex  -antiplatelet therapy: none  3. Pain Management: Tylenol, oxycodone as needed  -increase gabapentin to 300 mg q HS (for phantom pain)  -discussed massage/tactile feedback, vision/mirror therapy 4. Mood/Behavior/Sleep: LCSW to evaluate and provide emotional support  -history of bipolar: continue Wellbutrin SR 150 mg BID  -  antipsychotic agents: n/a  5. Neuropsych/cognition: This  patient is capable of making decisions on her own behalf.  -pt appears very positive and motivated to regain functional mobility 6. Skin/Wound Care: Routine skin care checks   -local dressing to incision, change daily prn.   -foam dressing to abrasions on anterior thigh 7. Fluids/Electrolytes/Nutrition: Routine Is and Os and follow-up chemistries  -continue Vit C, zinc supplements  8: Hypertension: monitor TID and prn  -continue amlodipine 10 mg daily  -continue hydralazine 25 mg BID  9: Hyperlipidemia: continue statin  10: Left AKA 9/25 Dr. 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 Renold Don   12: History of breast cancer on Arimadex 1 mg daily  13: Hypokalemia: 3.0 on 9/27>>follow-up BMP  14: Anemia: acute blood loss atop chronic  -follow-up CBC  15: GERD: continue PPI        Milinda Antis, PA-C 11/09/2022

## 2022-11-09 NOTE — Progress Notes (Signed)
Physical Therapy Treatment Patient Details Name: Tammy [name] MRN: [id number] DOB: 1955-12-11 Today's Date: 11/09/2022   History of Present Illness Tammy [name] is a 67 y.o. 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 She was treated with iv amp/sub for 6 wk then on chronic oral amox, however she had developed sinus tract that extendes 6 cm proximal to the [facility] and bone. Decision was made that she should undergo AKA to control infection on 9/25.    PT Comments  Pt tolerated treatment well today. Pt was able to progress ambulation with RW Min A with a chair follow. No change in DC/DME recs at this time. PT will continue to follow.    If plan is discharge home, recommend the following: A lot of help with walking and/or transfers;A lot of help with bathing/dressing/bathroom;Assistance with cooking/housework;Assist for transportation;Help with stairs or ramp for entrance   Can travel by private vehicle        Equipment Recommendations  Other (comment) (Per accepting facility)    Recommendations for Other Services       Precautions / Restrictions Precautions Precautions: Fall Restrictions Weight Bearing Restrictions: Yes LLE Weight Bearing: Non weight bearing     Mobility  Bed Mobility               General bed mobility comments: Up in chair with OT    Transfers Overall transfer level: Needs assistance Equipment used: Rolling walker (2 wheels) Transfers: Sit to/from Stand Sit to Stand: Min assist           General transfer comment: min assist to power up and steady from EOB and BSC, cueing for hand placement and increased time required    Ambulation/Gait Ambulation/Gait  assistance: Min assist, +2 safety/equipment Gait Distance (Feet): 30 Feet (15+15) Assistive device: Rolling walker (2 wheels) Gait Pattern/deviations:  (hop to) Gait velocity: decreased     General Gait Details: Chair follow for safety and +2 line management. 1 seated rest break however no overt LOB. Pt noted to fatigue fairly quickly.   Stairs             Wheelchair Mobility     Tilt Bed    Modified Rankin (Stroke Patients Only)       Balance Overall balance assessment: Needs assistance Sitting-balance support: Bilateral upper extremity supported Sitting balance-Leahy Scale: Fair     Standing balance support: Bilateral upper extremity supported, Single extremity supported, During functional activity Standing balance-Leahy Scale: Poor Standing balance comment: Reliant on RW                            Cognition Arousal: Alert Behavior During Therapy: WFL for tasks assessed/performed Overall Cognitive Status: Within Functional Limits for tasks assessed                                          Exercises      General Comments General comments (skin integrity, edema, etc.): VSS      Pertinent Vitals/Pain Pain Assessment Pain Assessment: Faces Faces Pain Scale: Hurts even more Pain Location: Residual limb Pain Descriptors /  Indicators: Discomfort, Grimacing, Sharp, Throbbing Pain Intervention(s): Monitored during session, Repositioned, Premedicated before session    Home Living                          Prior Function            PT Goals (current goals can now be found in the care plan section) Progress towards PT goals: Progressing toward goals    Frequency    Min 1X/week      PT Plan      Co-evaluation              AM-PAC PT "6 Clicks" Mobility   Outcome Measure  Help needed turning from your back to your side while in a flat bed without using bedrails?: A Lot Help needed moving from lying on  your back to sitting on the side of a flat bed without using bedrails?: A Lot Help needed moving to and from a bed to a chair (including a wheelchair)?: A Lot Help needed standing up from a chair using your arms (e.g., wheelchair or bedside chair)?: A Lot Help needed to walk in hospital room?: Total Help needed climbing 3-5 steps with a railing? : Total 6 Click Score: 10    End of Session Equipment Utilized During Treatment: Gait belt Activity Tolerance: Patient tolerated treatment well Patient left: in chair;with call bell/phone within reach;with chair alarm set Nurse Communication: Mobility status PT Visit Diagnosis: Other abnormalities of gait and mobility (R26.89)     Time: 2130-8657 PT Time Calculation (min) (ACUTE ONLY): 23 min  Charges:    $Gait Training: 23-37 mins PT General Charges $$ ACUTE PT VISIT: 1 Visit                     Shela Nevin, PT, DPT Acute Rehab Services [phone]    Tammy [name] 11/09/2022, 12:53 PM

## 2022-11-09 NOTE — Progress Notes (Addendum)
Inpatient Rehab Admissions Coordinator:    Peer to peer completed with Dr. [name] for insurance auth for CIR. They denied case. Pt. Wishes to appeal. I will send that off once denial is officially processed.   Megan Salon, MS, CCC-SLP Rehab Admissions Coordinator  [phone] (celll) [phone] (office)

## 2022-11-10 MED ORDER — POTASSIUM CHLORIDE CRYS ER 20 MEQ PO TBCR
20.0000 meq | EXTENDED_RELEASE_TABLET | Freq: Once | ORAL | Status: AC
  Administered 2022-11-10: 20 meq via ORAL
  Filled 2022-11-10: qty 1

## 2022-11-10 NOTE — Progress Notes (Signed)
PHARMACY CONSULT NOTE FOR:  OUTPATIENT  PARENTERAL ANTIBIOTIC THERAPY (OPAT)  Indication: Osteomyelitis s/p AKA with retained hardware  Regimen: Vancomycin 750mg  IV q12 hours AND Ceftriaxone 2g IV Q24H  End date: 12/15/22   IV antibiotic discharge orders are pended. To discharging provider:  please sign these orders via discharge navigator,  Select New Orders & click on the button choice - Manage This Unsigned Work.   BMET and Vancomycin trough bi-weekly CBC with diff, CRP, ESR weekly  Pull PICC at completion of IV antibiotics    Thank you for allowing pharmacy to be a part of this patient's care.  Rexford Maus, PharmD, BCPS 11/10/2022 8:50 AM

## 2022-11-10 NOTE — Plan of Care (Signed)
  Problem: Education: Goal: Knowledge of the prescribed therapeutic regimen will improve Outcome: Progressing Goal: Individualized Educational Video(s) Outcome: Progressing   Problem: Activity: Goal: Ability to avoid complications of mobility impairment will improve Outcome: Progressing Goal: Range of joint motion will improve Outcome: Progressing   Problem: Clinical Measurements: Goal: Postoperative complications will be avoided or minimized Outcome: Progressing   Problem: Pain Management: Goal: Pain level will decrease with appropriate interventions Outcome: Progressing   Problem: Skin Integrity: Goal: Will show signs of wound healing Outcome: Progressing   Problem: Education: Goal: Knowledge of the prescribed therapeutic regimen will improve Outcome: Progressing Goal: Ability to verbalize activity precautions or restrictions will improve Outcome: Progressing Goal: Understanding of discharge needs will improve Outcome: Progressing   Problem: Activity: Goal: Ability to perform//tolerate increased activity and mobilize with assistive devices will improve Outcome: Progressing   Problem: Clinical Measurements: Goal: Postoperative complications will be avoided or minimized Outcome: Progressing   Problem: Self-Care: Goal: Ability to meet self-care needs will improve Outcome: Progressing   Problem: Self-Concept: Goal: Ability to maintain and perform role responsibilities to the fullest extent possible will improve Outcome: Progressing   Problem: Pain Management: Goal: Pain level will decrease with appropriate interventions Outcome: Progressing   Problem: Skin Integrity: Goal: Demonstration of wound healing without infection will improve Outcome: Progressing   Problem: Education: Goal: Knowledge of General Education information will improve Description: Including pain rating scale, medication(s)/side effects and non-pharmacologic comfort measures Outcome:  Progressing   Problem: Health Behavior/Discharge Planning: Goal: Ability to manage health-related needs will improve Outcome: Progressing   Problem: Clinical Measurements: Goal: Ability to maintain clinical measurements within normal limits will improve Outcome: Progressing Goal: Will remain free from infection Outcome: Progressing Goal: Diagnostic test results will improve Outcome: Progressing Goal: Respiratory complications will improve Outcome: Progressing Goal: Cardiovascular complication will be avoided Outcome: Progressing   Problem: Activity: Goal: Risk for activity intolerance will decrease Outcome: Progressing   Problem: Nutrition: Goal: Adequate nutrition will be maintained Outcome: Progressing   Problem: Coping: Goal: Level of anxiety will decrease Outcome: Progressing   Problem: Elimination: Goal: Will not experience complications related to bowel motility Outcome: Progressing Goal: Will not experience complications related to urinary retention Outcome: Progressing   Problem: Pain Managment: Goal: General experience of comfort will improve Outcome: Progressing   Problem: Safety: Goal: Ability to remain free from injury will improve Outcome: Progressing   Problem: Skin Integrity: Goal: Risk for impaired skin integrity will decrease Outcome: Progressing

## 2022-11-10 NOTE — Progress Notes (Signed)
Inpatient Rehab Admissions Coordinator:    Appeal for CIR filed yesterday, I await a decision.   Megan Salon, MS, CCC-SLP Rehab Admissions Coordinator  [phone] (celll) [phone] (office)

## 2022-11-10 NOTE — [place] FL2 (Signed)
[place] MEDICAID FL2 LEVEL OF CARE FORM     IDENTIFICATION  Patient Name: Tammy [name] Birthdate: 07-01-1955 Sex: female Admission Date (Current Location): 11/04/2022  [facility] and [place] Number:  Producer, television/film/video and Address:  [facility] [place], [address], [place], [place] [address]      Provider Number: [id number]  Attending Physician Name and Address:  Nadara Mustard, MD  Relative Name and Phone Number:  Branch,[name] Significant other [phone]    Current Level of Care: Hospital Recommended Level of Care: Skilled Nursing Facility Prior Approval Number:    Date Approved/Denied:   PASRR Number:    Discharge Plan: SNF    Current Diagnoses: Patient Active Problem List   Diagnosis Date Noted   Infection of total knee replacement (HCC) 11/04/2022   S/P AKA (above knee amputation) unilateral, left (HCC) 11/04/2022   Chronic hepatitis C with cirrhosis (HCC) 06/06/2022   Surgical wound dehiscence, initial encounter 06/04/2022   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, left, with nonunion, subsequent encounter 04/30/2022   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 04/30/2022   Hypokalemia 04/16/2021   Hip fracture requiring operative repair (HCC) 04/16/2021   Vitamin D deficiency 04/12/2021   Hyponatremia 04/11/2021   Acute postoperative anemia due to expected blood loss 04/11/2021   Class 1 obesity 04/11/2021   Left comminuted and displaced fracture of the distal femur metaphysis 04/08/2021   History of CVA (cerebrovascular accident) 04/08/2021   Genetic testing 07/17/2020   Family history of breast cancer 07/10/2020   Family history of ovarian cancer 07/10/2020   Family history of colon cancer 07/10/2020   Malignant neoplasm of upper-outer quadrant of left breast in female, estrogen receptor positive (HCC) 07/09/2020   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    Chronic pain syndrome 10/06/2012   Nicotine dependence 10/06/2012   Anxiety state 10/05/2012   Bipolar disorder, unspecified (HCC) 10/05/2012   Slurred speech 10/04/2012   Left-sided weakness 10/04/2012   Hyperkalemia 10/04/2012   Acute CVA (cerebrovascular accident) (HCC) 10/04/2012   Hypertension     Orientation RESPIRATION BLADDER Height & Weight     Self, Time, Situation, Place  Normal Continent Weight: 151 lb (68.5 kg) Height:  5' 2.5" (158.8 cm)  BEHAVIORAL SYMPTOMS/MOOD NEUROLOGICAL BOWEL NUTRITION STATUS      Continent Diet (see discharge summary)  AMBULATORY STATUS COMMUNICATION OF NEEDS Skin   Limited Assist Verbally Surgical wounds                       Personal Care Assistance Level of Assistance  Bathing, Feeding, Dressing Bathing Assistance: Limited assistance Feeding assistance: Limited assistance Dressing Assistance: Maximum assistance     Functional Limitations Info  Sight, Hearing, Speech Sight Info: Adequate Hearing Info: Adequate Speech Info: Adequate    SPECIAL CARE FACTORS FREQUENCY  PT (By licensed PT), OT (By licensed OT)     PT Frequency: 5x week OT Frequency: 5x week            Contractures Contractures Info: Not present    Additional Factors Info  Code Status, Allergies Code Status Info: full Allergies Info: lisinopril           Current Medications (11/10/2022):  This is the current hospital active medication list Current Facility-Administered Medications  Medication Dose Route Frequency Provider Last Rate Last Admin   acetaminophen (TYLENOL) tablet 325-650 mg  325-650 mg Oral Q6H PRN Nadara Mustard, MD  650 mg at 11/08/22 1119   alum & mag hydroxide-simeth (MAALOX/MYLANTA) 200-200-20 MG/5ML suspension 15-30 mL  15-30 mL Oral Q2H PRN Nadara Mustard, MD       amLODipine (NORVASC) tablet 10 mg  10 mg Oral Daily Nadara Mustard, MD   10 mg at 11/10/22 0831   anastrozole (ARIMIDEX) tablet 1 mg  1 mg Oral Daily Nadara Mustard,  MD   1 mg at 11/10/22 0830   ascorbic acid (VITAMIN C) tablet 1,000 mg  1,000 mg Oral Daily Nadara Mustard, MD   1,000 mg at 11/10/22 0831   bisacodyl (DULCOLAX) EC tablet 5 mg  5 mg Oral Daily PRN Nadara Mustard, MD   5 mg at 11/10/22 4034   buPROPion (WELLBUTRIN SR) 12 hr tablet 150 mg  150 mg Oral BID Nadara Mustard, MD   150 mg at 11/10/22 7425   cefTRIAXone (ROCEPHIN) 2 g in sodium chloride 0.9 % 100 mL IVPB  2 g Intravenous Q24H Judyann Munson, MD 200 mL/hr at 11/09/22 1658 2 g at 11/09/22 1658   Chlorhexidine Gluconate Cloth 2 % PADS 6 each  6 each Topical Daily Nadara Mustard, MD   6 each at 11/10/22 9563   docusate sodium (COLACE) capsule 100 mg  100 mg Oral Daily Nadara Mustard, MD   100 mg at 11/10/22 8756   gabapentin (NEURONTIN) capsule 100 mg  100 mg Oral QHS Nadara Mustard, MD   100 mg at 11/09/22 2032   guaiFENesin-dextromethorphan (ROBITUSSIN DM) 100-10 MG/5ML syrup 15 mL  15 mL Oral Q4H PRN Nadara Mustard, MD       hydrALAZINE (APRESOLINE) injection 5 mg  5 mg Intravenous Q20 Min PRN Nadara Mustard, MD       hydrALAZINE (APRESOLINE) tablet 25 mg  25 mg Oral BID Nadara Mustard, MD   25 mg at 11/10/22 4332   HYDROmorphone (DILAUDID) injection 0.5-1 mg  0.5-1 mg Intravenous Q4H PRN Nadara Mustard, MD   1 mg at 11/08/22 9518   labetalol (NORMODYNE) injection 10 mg  10 mg Intravenous Q10 min PRN Nadara Mustard, MD       magnesium citrate solution 1 Bottle  1 Bottle Oral Once PRN Nadara Mustard, MD       magnesium sulfate IVPB 2 g 50 mL  2 g Intravenous Daily PRN Nadara Mustard, MD       metoprolol tartrate (LOPRESSOR) injection 2-5 mg  2-5 mg Intravenous Q2H PRN Nadara Mustard, MD       nutrition supplement (JUVEN) (JUVEN) powder packet 1 packet  1 packet Oral BID BM Nadara Mustard, MD   1 packet at 11/10/22 1412   ondansetron (ZOFRAN) injection 4 mg  4 mg Intravenous Q6H PRN Nadara Mustard, MD       oxyCODONE (Oxy IR/ROXICODONE) immediate release tablet 10-15 mg  10-15 mg Oral Q4H  PRN Nadara Mustard, MD   15 mg at 11/10/22 1411   oxyCODONE (Oxy IR/ROXICODONE) immediate release tablet 5 mg  5 mg Oral Q4H PRN Nadara Mustard, MD   5 mg at 11/06/22 0917   pantoprazole (PROTONIX) EC tablet 40 mg  40 mg Oral Daily Nadara Mustard, MD   40 mg at 11/10/22 0831   phenol (CHLORASEPTIC) mouth spray 1 spray  1 spray Mouth/Throat PRN Nadara Mustard, MD       polyethylene glycol (MIRALAX / GLYCOLAX) packet 17 g  17  g Oral Daily PRN Nadara Mustard, MD       sodium chloride flush (NS) 0.9 % injection 10-40 mL  10-40 mL Intracatheter PRN Nadara Mustard, MD       vancomycin Luna Kitchens) IVPB 750 mg/150 mL  750 mg Intravenous Q12H Rollene Fare, RPH 150 mL/hr at 11/10/22 1211 750 mg at 11/10/22 1211   zinc sulfate capsule 220 mg  220 mg Oral Daily Nadara Mustard, MD   220 mg at 11/10/22 0831     Discharge Medications: Please see discharge summary for a list of discharge medications.  Relevant Imaging Results:  Relevant Lab Results:   Additional Information SSN: [id number]  Lorri Frederick, LCSW

## 2022-11-10 NOTE — TOC Initial Note (Signed)
Transition of Care [facility]) - Initial/Assessment Note    Patient Details  Name: Tammy [name] MRN: [id number] Date of Birth: Dec 19, 1955  Transition of [facility]) CM/SW Contact:    Lorri Frederick, LCSW Phone Number: 11/10/2022, 3:26 PM  Clinical Narrative:          CSW met with pt to discuss DC plan.  Pt is appealing CIR denial currently, discussed referral for SNF as backup plan and pt is agreeable to this.  Medicare choice document provided.  Pt from home with significant other [name].  Permission given to speak with [name], 2 daughters, sister.  Permission given to send out referral in hub.  TOC will continue follow if SNF needed based on CIR appeal.         Expected Discharge Plan: IP Rehab Facility Barriers to Discharge: Continued Medical Work up, SNF Pending bed offer   Patient Goals and CMS Choice Patient states their goals for this hospitalization and ongoing recovery are:: live independently CMS Medicare.gov Compare Post Acute Care list provided to:: Patient Choice offered to / list presented to : Patient      Expected Discharge Plan and Services In-house Referral: Clinical Social Work   Post Acute Care Choice: IP Rehab Living arrangements for the past 2 months: Single Family Home                                      Prior Living Arrangements/Services Living arrangements for the past 2 months: Single Family Home Lives with:: Significant Other Patient language and need for interpreter reviewed:: Yes Do you feel safe going back to the place where you live?: Yes      Need for Family Participation in Patient Care: No (Comment) Care giver support system in place?: Yes (comment) Current home services: Home PT, Home OT ([facility]) Criminal Activity/Legal Involvement Pertinent to Current Situation/Hospitalization: No - Comment as needed  Activities of Daily Living   ADL Screening (condition at time of admission) Does the patient have a NEW difficulty  with bathing/dressing/toileting/self-feeding that is expected to last >3 days?: No Does the patient have a NEW difficulty with getting in/out of bed, walking, or climbing stairs that is expected to last >3 days?: No Does the patient have a NEW difficulty with communication that is expected to last >3 days?: No Is the patient deaf or have difficulty hearing?: No Does the patient have difficulty seeing, even when wearing glasses/contacts?: Yes (wears for reading) Does the patient have difficulty concentrating, remembering, or making decisions?: No  Permission Sought/Granted Permission sought to share information with : Family Supports Permission granted to share information with : Yes, Verbal Permission Granted  Share Information with NAME: Branch,[name] Significant other [phone]  [phone]  Christy Sartorius Daughter [phone]  [phone]  Ervine,Loressa Daughter [phone]  [phone]  Lee,Lenora Sister  Permission granted to share info w AGENCY: SNF        Emotional Assessment Appearance:: Appears stated age Attitude/Demeanor/Rapport: Engaged Affect (typically observed): Appropriate, Pleasant Orientation: : Oriented to Self, Oriented to Place, Oriented to  Time, Oriented to Situation      Admission diagnosis:  S/P AKA (above knee amputation) unilateral, left (HCC) [id number] Patient Active Problem List   Diagnosis Date Noted   Infection of total knee replacement (HCC) 11/04/2022   S/P AKA (above knee amputation) unilateral, left (HCC) 11/04/2022   Chronic hepatitis C with cirrhosis (HCC) 06/06/2022   Surgical wound dehiscence,  initial encounter 06/04/2022   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, left, with nonunion, subsequent encounter 04/30/2022   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 04/30/2022   Hypokalemia 04/16/2021   Hip fracture requiring operative repair (HCC) 04/16/2021   Vitamin D  deficiency 04/12/2021   Hyponatremia 04/11/2021   Acute postoperative anemia due to expected blood loss 04/11/2021   Class 1 obesity 04/11/2021   Left comminuted and displaced fracture of the distal femur metaphysis 04/08/2021   History of CVA (cerebrovascular accident) 04/08/2021   Genetic testing 07/17/2020   Family history of breast cancer 07/10/2020   Family history of ovarian cancer 07/10/2020   Family history of colon cancer 07/10/2020   Malignant neoplasm of upper-outer quadrant of left breast in female, estrogen receptor positive (HCC) 07/09/2020   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   Chronic pain syndrome 10/06/2012   Nicotine dependence 10/06/2012   Anxiety state 10/05/2012   Bipolar disorder, unspecified (HCC) 10/05/2012   Slurred speech 10/04/2012   Left-sided weakness 10/04/2012   Hyperkalemia 10/04/2012   Acute CVA (cerebrovascular accident) (HCC) 10/04/2012   Hypertension    PCP:  Quitman Livings, MD Pharmacy:   [id number] - [place], [place] - [address] AT [facility] [address] [place] [address] Phone: [phone] Fax: [phone]  [id number] [place], [place] - [address] AT [facility] OF [address] [place] [address] Phone: [phone] Fax: [phone]  [facility]) - [place], [place] - [address] [facility] [address] [place] [address] [place] [address] Phone: [phone] Fax: [phone]  [facility] [address] [place] [address] Phone: [phone] Fax: [phone]  [facility] - [place], [place] - [address] [place] [address] Phone: [phone] Fax: [phone]     Social Determinants of Health (SDOH) Social History: SDOH Screenings   Food Insecurity: No Food Insecurity (06/04/2022)  Housing:  Low Risk  (06/04/2022)  Transportation Needs: No Transportation Needs (06/04/2022)  Utilities: Not At Risk (06/04/2022)  Depression (PHQ2-9): Low Risk  (11/03/2022)  Tobacco Use: Medium Risk (11/04/2022)   SDOH Interventions:     Readmission Risk Interventions    06/05/2022    2:21 PM 05/01/2022    1:55 PM  Readmission Risk Prevention Plan  Post Dischage Appt  Complete  Medication Screening  Complete  Transportation Screening Complete Complete  PCP or Specialist Appt within 5-7 Days Complete   Home Care Screening Complete   Medication Review (RN CM) Complete

## 2022-11-10 NOTE — Progress Notes (Signed)
Mobility Specialist: Progress Note   11/10/22 1545  Mobility  Activity Ambulated with assistance to bathroom  Level of Assistance Contact guard assist, steadying assist  Assistive Device Front wheel walker  Distance Ambulated (ft) 20 ft  LLE Weight Bearing NWB  Activity Response Tolerated well  Mobility Referral Yes  $Mobility charge 1 Mobility  Mobility Specialist Start Time (ACUTE ONLY) [id number]  Mobility Specialist Stop Time (ACUTE ONLY) 0945  Mobility Specialist Time Calculation (min) (ACUTE ONLY) 20 min    Pt requested to go to the bathroom - received in bed. CG throughout but required minA to scoot hips towards EOB. Able to stand for extended period of time (~3 min) to complete hygiene. Had c/o LLE pain but tolerable. Left in chair with all needs met, call bell in reach.   MS returned around 3pm to transfer pt back to bed. Light minA for STS, CG for ambulation. Had complaints of LLE pain, CG to assist RLE back on bed. Left in bed with all needs met, call bell in reach. Daughter in room.   Maurene Capes Mobility Specialist Please contact via SecureChat or Rehab office at [phone]

## 2022-11-10 NOTE — Progress Notes (Signed)
Patient ID: Tammy [name], female   DOB: 01-27-1956, 67 y.o.   MRN: [id number] Patient is status post above-knee amputation.  She is making progress with therapy.  I performed a peer to peer review yesterday and the medical director stated that her admission to inpatient rehab was contrary to [facility] recommendations.  Patient has filed a appeal.  Patient will definitely benefit from inpatient rehab.

## 2022-11-10 NOTE — Progress Notes (Signed)
Physical Therapy Treatment Patient Details Name: Tammy [name] MRN: [id number] DOB: 10-04-1955 Today's Date: 11/10/2022   History of Present Illness Tammy [name] is a 67 y.o. 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 She was treated with iv amp/sub for 6 wk then on chronic oral amox, however she had developed sinus tract that extendes 6 cm proximal to the [facility] and bone. Decision was made that she should undergo AKA to control infection on 9/25.    PT Comments  Pt tolerated treatment well today. Pt able to progress ambulation distance today in hallway with RW CGA/Min A once fatigued and a chair follow. No change in DC/DME recs at this time. PT will continue to follow.    If plan is discharge home, recommend the following: A lot of help with walking and/or transfers;A lot of help with bathing/dressing/bathroom;Assistance with cooking/housework;Assist for transportation;Help with stairs or ramp for entrance   Can travel by private vehicle        Equipment Recommendations  Other (comment) (Per accepting facility)    Recommendations for Other Services       Precautions / Restrictions Precautions Precautions: Fall Restrictions Weight Bearing Restrictions: Yes LLE Weight Bearing: Non weight bearing     Mobility  Bed Mobility               General bed mobility comments: Up in chair.    Transfers Overall transfer level: Needs assistance Equipment used: Rolling walker (2 wheels) Transfers: Sit to/from Stand Sit to Stand: Contact guard assist           General transfer comment: Cues for hand placement    Ambulation/Gait Ambulation/Gait assistance: Contact guard assist, Min  assist, +2 safety/equipment Gait Distance (Feet): 45 Feet (30+15) Assistive device: Rolling walker (2 wheels) Gait Pattern/deviations:  (hop to) Gait velocity: decreased     General Gait Details: Chair follow for safety and +2 line management. 1 seated rest break however no overt LOB. Pt mostly CGA however Min A once fatigued.   Stairs             Wheelchair Mobility     Tilt Bed    Modified Rankin (Stroke Patients Only)       Balance Overall balance assessment: Needs assistance Sitting-balance support: Bilateral upper extremity supported Sitting balance-Leahy Scale: Fair     Standing balance support: Bilateral upper extremity supported, Single extremity supported, During functional activity Standing balance-Leahy Scale: Poor Standing balance comment: Reliant on RW                            Cognition Arousal: Alert Behavior During Therapy: WFL for tasks assessed/performed Overall Cognitive Status: Within Functional Limits for tasks assessed                                          Exercises      General Comments General comments (skin integrity, edema, etc.): VSS      Pertinent Vitals/Pain Pain Assessment Pain Assessment: Faces Faces Pain Scale: Hurts little more Pain Location: Residual limb Pain Descriptors / Indicators: Discomfort, Grimacing, Sharp, Throbbing Pain  Intervention(s): Monitored during session    Home Living                          Prior Function            PT Goals (current goals can now be found in the care plan section) Progress towards PT goals: Progressing toward goals    Frequency    Min 1X/week      PT Plan      Co-evaluation              AM-PAC PT "6 Clicks" Mobility   Outcome Measure  Help needed turning from your back to your side while in a flat bed without using bedrails?: A Little Help needed moving from lying on your back to sitting on the side of a flat bed  without using bedrails?: A Little Help needed moving to and from a bed to a chair (including a wheelchair)?: A Little Help needed standing up from a chair using your arms (e.g., wheelchair or bedside chair)?: A Little Help needed to walk in hospital room?: A Lot Help needed climbing 3-5 steps with a railing? : Total 6 Click Score: 15    End of Session Equipment Utilized During Treatment: Gait belt Activity Tolerance: Patient tolerated treatment well Patient left: in chair;with call bell/phone within reach;with chair alarm set Nurse Communication: Mobility status PT Visit Diagnosis: Other abnormalities of gait and mobility (R26.89)     Time: 2694-8546 PT Time Calculation (min) (ACUTE ONLY): 10 min  Charges:    $Gait Training: 8-22 mins PT General Charges $$ ACUTE PT VISIT: 1 Visit                     Shela Nevin, PT, DPT Acute Rehab Services [phone]    [name] 11/10/2022, 12:11 PM

## 2022-11-11 MED ORDER — OXYCODONE-ACETAMINOPHEN 5-325 MG PO TABS
1.0000 | ORAL_TABLET | ORAL | 0 refills | Status: DC | PRN
Start: 2022-11-11 — End: 2022-11-23

## 2022-11-11 NOTE — Progress Notes (Signed)
Occupational Therapy Treatment Patient Details Name: Tammy [name] MRN: [id number] DOB: [birth year] Today's Date: 11/11/2022   History of present illness Tammy [name] is a 67 y.o. 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 She was treated with iv amp/sub for 6 wk then on chronic oral amox, however she had developed sinus tract that extendes 6 cm proximal to the [facility] and bone. Decision was made that she should undergo AKA to control infection on 9/25.   OT comments  Patient progressing well towards OT goals.  She is demonstrating improved tolerance to activity, but continues to fatigue with sustained standing during ADLs.  Pain increasing throughout session, but pt remains highly motivated.  Min assist overall for transfers and functional mobility using RW, up to min assist for ADLs.  Continue to recommend >3hrs/day inpatient setting.       If plan is discharge home, recommend the following:  A little help with walking and/or transfers;Assistance with cooking/housework;Assist for transportation;Help with stairs or ramp for entrance;A little help with bathing/dressing/bathroom   Equipment Recommendations  Other (comment) (defer)    Recommendations for Other Services Rehab consult    Precautions / Restrictions Precautions Precautions: Fall Restrictions Weight Bearing Restrictions: Yes LLE Weight Bearing: Non weight bearing       Mobility Bed Mobility Overal bed mobility: Needs Assistance Bed Mobility: Supine to Sit     Supine to sit: HOB elevated, Used rails, Min assist     General bed mobility comments: increased time required, cueing for techniques and min assist to fully bring L LE  towards EOB    Transfers Overall transfer level: Needs assistance Equipment used: Rolling walker (2 wheels) Transfers: Sit to/from Stand Sit to Stand: Min assist           General transfer comment: to fully power up and steady     Balance Overall balance assessment: Needs assistance Sitting-balance support: No upper extremity supported, Feet supported, Single extremity supported Sitting balance-Leahy Scale: Fair Sitting balance - Comments: dynamically   Standing balance support: Bilateral upper extremity supported, During functional activity, Single extremity supported Standing balance-Leahy Scale: Poor Standing balance comment: relies on at least 1 UE support during ADLS                           ADL either performed or assessed with clinical judgement   ADL Overall ADL's : Needs assistance/impaired     Grooming: Contact guard assist;Standing;Oral care;Wash/dry face Grooming Details (indicate cue type and reason): standing initally, transitioned to sitting on BSC when fatigued         Upper Body Dressing : Set up;Sitting       Toilet Transfer: Minimal assistance;Ambulation;Rolling walker (2 wheels);BSC/3in1 Toilet Transfer Details (indicate cue type and reason): 3:1 over toilet Toileting- Clothing Manipulation and Hygiene: Minimal assistance;Sit to/from stand Toileting - Clothing Manipulation Details (indicate cue type and reason): using 1 handed technique, able to complete hygiene in standing and lateral leans     Functional mobility during ADLs: Minimal assistance;Rolling walker (2 wheels) General ADL Comments: pt completing ADLs at sink after toileting needs, fatigues with prolonged standing and has to transition to sitting    Extremity/Trunk Assessment  Vision       Perception     Praxis      Cognition Arousal: Alert Behavior During Therapy: WFL for tasks assessed/performed Overall Cognitive Status: Within Functional  Limits for tasks assessed                                          Exercises      Shoulder Instructions       General Comments VSS    Pertinent Vitals/ Pain       Pain Assessment Pain Assessment: Faces Faces Pain Scale: Hurts little more Pain Location: Residual limb Pain Descriptors / Indicators: Discomfort, Grimacing, Sharp, Throbbing Pain Intervention(s): Limited activity within patient's tolerance, Monitored during session, Repositioned  Home Living                                          Prior Functioning/Environment              Frequency  Min 1X/week        Progress Toward Goals  OT Goals(current goals can now be found in the care plan section)  Progress towards OT goals: Progressing toward goals  Acute Rehab OT Goals Patient Stated Goal: rehab OT Goal Formulation: With patient Time For Goal Achievement: 11/19/22 Potential to Achieve Goals: Good  Plan      Co-evaluation                 AM-PAC OT "6 Clicks" Daily Activity     Outcome Measure   Help from another person eating meals?: None Help from another person taking care of personal grooming?: A Little Help from another person toileting, which includes using toliet, bedpan, or urinal?: A Little Help from another person bathing (including washing, rinsing, drying)?: A Little Help from another person to put on and taking off regular upper body clothing?: A Little Help from another person to put on and taking off regular lower body clothing?: A Lot 6 Click Score: 18    End of Session Equipment Utilized During Treatment: Gait belt;Rolling walker (2 wheels)  OT Visit Diagnosis: Unsteadiness on feet (R26.81);Other abnormalities of gait and mobility (R26.89);Muscle weakness (generalized) (M62.81);Pain Pain - Right/Left: Left Pain - part of body: Hip   Activity Tolerance Patient tolerated treatment well   Patient Left in chair;with call bell/phone  within reach   Nurse Communication Mobility status        Time: 0630-1601 OT Time Calculation (min): 40 min  Charges: OT General Charges $OT Visit: 1 Visit OT Treatments $Self Care/Home Management : 38-52 mins  Barry Brunner, OT Acute Rehabilitation Services Office [phone]   Chancy Milroy 11/11/2022, 1:54 PM

## 2022-11-11 NOTE — Plan of Care (Signed)
  Problem: Education: Goal: Knowledge of the prescribed therapeutic regimen will improve Outcome: Progressing Goal: Individualized Educational Video(s) Outcome: Progressing   Problem: Activity: Goal: Ability to avoid complications of mobility impairment will improve Outcome: Progressing Goal: Range of joint motion will improve Outcome: Progressing   Problem: Clinical Measurements: Goal: Postoperative complications will be avoided or minimized Outcome: Progressing   Problem: Pain Management: Goal: Pain level will decrease with appropriate interventions Outcome: Progressing   Problem: Skin Integrity: Goal: Will show signs of wound healing Outcome: Progressing   Problem: Education: Goal: Knowledge of the prescribed therapeutic regimen will improve Outcome: Progressing Goal: Ability to verbalize activity precautions or restrictions will improve Outcome: Progressing Goal: Understanding of discharge needs will improve Outcome: Progressing   Problem: Activity: Goal: Ability to perform//tolerate increased activity and mobilize with assistive devices will improve Outcome: Progressing   Problem: Clinical Measurements: Goal: Postoperative complications will be avoided or minimized Outcome: Progressing   Problem: Self-Care: Goal: Ability to meet self-care needs will improve Outcome: Progressing   Problem: Self-Concept: Goal: Ability to maintain and perform role responsibilities to the fullest extent possible will improve Outcome: Progressing   Problem: Pain Management: Goal: Pain level will decrease with appropriate interventions Outcome: Progressing   Problem: Skin Integrity: Goal: Demonstration of wound healing without infection will improve Outcome: Progressing   Problem: Education: Goal: Knowledge of General Education information will improve Description: Including pain rating scale, medication(s)/side effects and non-pharmacologic comfort measures Outcome:  Progressing   Problem: Health Behavior/Discharge Planning: Goal: Ability to manage health-related needs will improve Outcome: Progressing   Problem: Clinical Measurements: Goal: Ability to maintain clinical measurements within normal limits will improve Outcome: Progressing Goal: Will remain free from infection Outcome: Progressing Goal: Diagnostic test results will improve Outcome: Progressing Goal: Respiratory complications will improve Outcome: Progressing Goal: Cardiovascular complication will be avoided Outcome: Progressing   Problem: Activity: Goal: Risk for activity intolerance will decrease Outcome: Progressing   Problem: Nutrition: Goal: Adequate nutrition will be maintained Outcome: Progressing   Problem: Coping: Goal: Level of anxiety will decrease Outcome: Progressing   Problem: Elimination: Goal: Will not experience complications related to bowel motility Outcome: Progressing Goal: Will not experience complications related to urinary retention Outcome: Progressing   Problem: Pain Managment: Goal: General experience of comfort will improve Outcome: Progressing   Problem: Safety: Goal: Ability to remain free from injury will improve Outcome: Progressing   Problem: Skin Integrity: Goal: Risk for impaired skin integrity will decrease Outcome: Progressing

## 2022-11-11 NOTE — Progress Notes (Signed)
Patient ID: Tammy [name], female   DOB: March 01, 1955, 67 y.o.   MRN: [id number] Patient is status post left above-knee amputation.  The wound VAC was not plugged in and not working.  The amputation site wound cultures remain negative.  Awaiting appeal for inpatient rehab.  Skilled nursing placement back up.  Prescription for Percocet on the chart for discharge to skilled nursing as necessary.  Will have the wound VAC dressing removed today since the wound VAC was unplugged.

## 2022-11-11 NOTE — Progress Notes (Signed)
This nurse along side Kristi, RN removed the woundvac from Pt's Left leg AKA per providers order. Pt was given pain medication 30 mins prior to removal and tolerated the removal well. Mepilex applied to staple sites. Pt educated and denies any questions/concerns at this time.

## 2022-11-11 NOTE — Progress Notes (Signed)
Inpatient Rehab Admissions Coordinator:    CIR following.Await decision on appeal.  Megan Salon, MS, CCC-SLP Rehab Admissions Coordinator  [phone] (celll) [phone] (office)

## 2022-11-11 NOTE — Care Management Important Message (Signed)
Important Message  Patient Details  Name: Keven Soucy MRN: [id number] Date of Birth: [birth year]   Important Message Given:  Yes - Medicare IM     Sherilyn Banker 11/11/2022, 1:09 PM

## 2022-11-11 NOTE — Progress Notes (Signed)
Physical Therapy Treatment Patient Details Name: Tammy [name] MRN: [id number] DOB: 08-09-1955 Today's Date: 11/11/2022   History of Present Illness Tammy [name] is a 67 y.o. 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 She was treated with iv amp/sub for 6 wk then on chronic oral amox, however she had developed sinus tract that extendes 6 cm proximal to the [facility] and bone. Decision was made that she should undergo AKA to control infection on 9/25.    PT Comments  Pt tolerated treatment well today. Pt continues to progress ambulation in the hallway with RW CGA/Min A. No change in DC/DME recs at this time. PT will continue to follow.    If plan is discharge home, recommend the following: A lot of help with walking and/or transfers;A lot of help with bathing/dressing/bathroom;Assistance with cooking/housework;Assist for transportation;Help with stairs or ramp for entrance   Can travel by private vehicle        Equipment Recommendations  Other (comment) (Per accepting facility)    Recommendations for Other Services       Precautions / Restrictions Precautions Precautions: Fall Restrictions Weight Bearing Restrictions: Yes LLE Weight Bearing: Non weight bearing     Mobility  Bed Mobility Overal bed mobility: Needs Assistance Bed Mobility: Sit to Supine       Sit to supine: Contact guard assist, HOB elevated, Used rails   General bed mobility comments: increased time required, cueing for technique to fully bring L LE back in bed    Transfers Overall transfer level: Needs assistance Equipment used: Rolling walker (2 wheels) Transfers: Sit to/from Stand Sit to Stand: Contact guard assist,  Min assist           General transfer comment: x3. Initially CGA however Min A once fatigued.    Ambulation/Gait Ambulation/Gait assistance: Contact guard assist, Min assist, +2 safety/equipment Gait Distance (Feet): 65 Feet (40+25) Assistive device: Rolling walker (2 wheels) Gait Pattern/deviations:  (hop to) Gait velocity: decreased     General Gait Details: Chair follow for safety and +2 line management. 1 seated rest break however no overt LOB. Pt mostly CGA however Min A once fatigued.   Stairs             Wheelchair Mobility     Tilt Bed    Modified Rankin (Stroke Patients Only)       Balance Overall balance assessment: Needs assistance Sitting-balance support: No upper extremity supported, Feet supported, Single extremity supported Sitting balance-Leahy Scale: Fair Sitting balance - Comments: dynamically   Standing balance support: Bilateral upper extremity supported, During functional activity, Single extremity supported Standing balance-Leahy Scale: Poor Standing balance comment: relies on at least 1 UE support during ADLS                            Cognition Arousal: Alert Behavior During Therapy: [facility] for tasks assessed/performed Overall Cognitive Status: Within Functional Limits for tasks assessed                                          Exercises      General Comments  General comments (skin integrity, edema, etc.): VSS      Pertinent Vitals/Pain Pain Assessment Pain Assessment: Faces Faces Pain Scale: Hurts little more Pain Location: Residual limb Pain Descriptors / Indicators: Discomfort, Grimacing, Sharp, Throbbing Pain Intervention(s): Limited activity within patient's tolerance, Monitored during session, Repositioned    Home Living                          Prior Function            PT Goals (current goals can now be found in the care plan section) Progress towards PT goals: Progressing  toward goals    Frequency    Min 1X/week      PT Plan      Co-evaluation              AM-PAC PT "6 Clicks" Mobility   Outcome Measure  Help needed turning from your back to your side while in a flat bed without using bedrails?: A Little Help needed moving from lying on your back to sitting on the side of a flat bed without using bedrails?: A Little Help needed moving to and from a bed to a chair (including a wheelchair)?: A Little Help needed standing up from a chair using your arms (e.g., wheelchair or bedside chair)?: A Little Help needed to walk in hospital room?: A Lot Help needed climbing 3-5 steps with a railing? : Total 6 Click Score: 15    End of Session Equipment Utilized During Treatment: Gait belt Activity Tolerance: Patient tolerated treatment well Patient left: in bed;with call bell/phone within reach;with nursing/sitter in room Nurse Communication: Mobility status PT Visit Diagnosis: Other abnormalities of gait and mobility (R26.89)     Time: 6283-1517 PT Time Calculation (min) (ACUTE ONLY): 19 min  Charges:    $Gait Training: 8-22 mins PT General Charges $$ ACUTE PT VISIT: 1 Visit                     Shela Nevin, PT, DPT Acute Rehab Services [phone]    [name] 11/11/2022, 3:50 PM

## 2022-11-12 NOTE — Plan of Care (Signed)

## 2022-11-12 NOTE — Progress Notes (Signed)
IP rehab admissions - Appeal was overturned and we do have approval for CIR admission.  No beds available on CIR today.  Will follow up tomorrow for bed availability.  Call for questions.  [phone]

## 2022-11-12 NOTE — Progress Notes (Signed)
Patient ID: Tammy [name], female   DOB: 10/27/1955, 67 y.o.   MRN: [id number] Patient is status post left above knee amputation. Wound vac removed yesterday. Patient comfortable this morning. Continues to await inpatient rehab appeal conclusion. Will plan for SNF as back up.   Adonis Huguenin, NP [phone]

## 2022-11-12 NOTE — Plan of Care (Signed)
  Problem: Education: Goal: Knowledge of the prescribed therapeutic regimen will improve Outcome: Progressing Goal: Individualized Educational Video(s) Outcome: Progressing   Problem: Activity: Goal: Ability to avoid complications of mobility impairment will improve Outcome: Progressing Goal: Range of joint motion will improve Outcome: Progressing   Problem: Clinical Measurements: Goal: Postoperative complications will be avoided or minimized Outcome: Progressing   Problem: Pain Management: Goal: Pain level will decrease with appropriate interventions Outcome: Progressing   Problem: Skin Integrity: Goal: Will show signs of wound healing Outcome: Progressing   Problem: Education: Goal: Knowledge of the prescribed therapeutic regimen will improve Outcome: Progressing Goal: Ability to verbalize activity precautions or restrictions will improve Outcome: Progressing Goal: Understanding of discharge needs will improve Outcome: Progressing   Problem: Activity: Goal: Ability to perform//tolerate increased activity and mobilize with assistive devices will improve Outcome: Progressing   Problem: Clinical Measurements: Goal: Postoperative complications will be avoided or minimized Outcome: Progressing   Problem: Self-Care: Goal: Ability to meet self-care needs will improve Outcome: Progressing   Problem: Self-Concept: Goal: Ability to maintain and perform role responsibilities to the fullest extent possible will improve Outcome: Progressing   Problem: Pain Management: Goal: Pain level will decrease with appropriate interventions Outcome: Progressing   Problem: Skin Integrity: Goal: Demonstration of wound healing without infection will improve Outcome: Progressing   Problem: Education: Goal: Knowledge of General Education information will improve Description: Including pain rating scale, medication(s)/side effects and non-pharmacologic comfort measures Outcome:  Progressing   Problem: Health Behavior/Discharge Planning: Goal: Ability to manage health-related needs will improve Outcome: Progressing   Problem: Clinical Measurements: Goal: Ability to maintain clinical measurements within normal limits will improve Outcome: Progressing Goal: Will remain free from infection Outcome: Progressing Goal: Diagnostic test results will improve Outcome: Progressing Goal: Respiratory complications will improve Outcome: Progressing Goal: Cardiovascular complication will be avoided Outcome: Progressing   Problem: Activity: Goal: Risk for activity intolerance will decrease Outcome: Progressing   Problem: Nutrition: Goal: Adequate nutrition will be maintained Outcome: Progressing   Problem: Coping: Goal: Level of anxiety will decrease Outcome: Progressing   Problem: Elimination: Goal: Will not experience complications related to bowel motility Outcome: Progressing Goal: Will not experience complications related to urinary retention Outcome: Progressing   Problem: Pain Managment: Goal: General experience of comfort will improve Outcome: Progressing   Problem: Safety: Goal: Ability to remain free from injury will improve Outcome: Progressing   Problem: Skin Integrity: Goal: Risk for impaired skin integrity will decrease Outcome: Progressing

## 2022-11-13 ENCOUNTER — Encounter (HOSPITAL_COMMUNITY): Payer: Self-pay | Admitting: Physical Medicine & Rehabilitation

## 2022-11-13 ENCOUNTER — Other Ambulatory Visit: Payer: Self-pay

## 2022-11-13 ENCOUNTER — Telehealth: Payer: Self-pay | Admitting: Orthopedic Surgery

## 2022-11-13 ENCOUNTER — Inpatient Hospital Stay (HOSPITAL_COMMUNITY)
Admission: RE | Admit: 2022-11-13 | Discharge: 2022-11-23 | DRG: 560 | Disposition: A | Discharge status: Home Health | Payer: Medicare Other | Attending: Physical Medicine & Rehabilitation | Admitting: Physical Medicine & Rehabilitation

## 2022-11-13 DIAGNOSIS — Z853 Personal history of malignant neoplasm of breast: Secondary | ICD-10-CM

## 2022-11-13 DIAGNOSIS — F3175 Bipolar disorder, in partial remission, most recent episode depressed: Secondary | ICD-10-CM

## 2022-11-13 DIAGNOSIS — Z8673 Personal history of transient ischemic attack (TIA), and cerebral infarction without residual deficits: Secondary | ICD-10-CM

## 2022-11-13 DIAGNOSIS — Z87891 Personal history of nicotine dependence: Secondary | ICD-10-CM | POA: Diagnosis not present

## 2022-11-13 DIAGNOSIS — Z170 Estrogen receptor positive status [ER+]: Secondary | ICD-10-CM

## 2022-11-13 DIAGNOSIS — M869 Osteomyelitis, unspecified: Secondary | ICD-10-CM | POA: Diagnosis not present

## 2022-11-13 DIAGNOSIS — E876 Hypokalemia: Secondary | ICD-10-CM | POA: Diagnosis present

## 2022-11-13 DIAGNOSIS — Z800 Family history of malignant neoplasm of digestive organs: Secondary | ICD-10-CM | POA: Diagnosis not present

## 2022-11-13 DIAGNOSIS — Z4781 Encounter for orthopedic aftercare following surgical amputation: Secondary | ICD-10-CM | POA: Diagnosis present

## 2022-11-13 DIAGNOSIS — Z8041 Family history of malignant neoplasm of ovary: Secondary | ICD-10-CM

## 2022-11-13 DIAGNOSIS — K219 Gastro-esophageal reflux disease without esophagitis: Secondary | ICD-10-CM | POA: Diagnosis present

## 2022-11-13 DIAGNOSIS — E785 Hyperlipidemia, unspecified: Secondary | ICD-10-CM | POA: Diagnosis present

## 2022-11-13 DIAGNOSIS — Z803 Family history of malignant neoplasm of breast: Secondary | ICD-10-CM

## 2022-11-13 DIAGNOSIS — D62 Acute posthemorrhagic anemia: Secondary | ICD-10-CM | POA: Diagnosis present

## 2022-11-13 DIAGNOSIS — R609 Edema, unspecified: Secondary | ICD-10-CM | POA: Diagnosis not present

## 2022-11-13 DIAGNOSIS — I10 Essential (primary) hypertension: Secondary | ICD-10-CM | POA: Diagnosis present

## 2022-11-13 DIAGNOSIS — Z89612 Acquired absence of left leg above knee: Principal | ICD-10-CM | POA: Diagnosis present

## 2022-11-13 DIAGNOSIS — Z79811 Long term (current) use of aromatase inhibitors: Secondary | ICD-10-CM

## 2022-11-13 DIAGNOSIS — K7460 Unspecified cirrhosis of liver: Secondary | ICD-10-CM | POA: Diagnosis present

## 2022-11-13 DIAGNOSIS — Z833 Family history of diabetes mellitus: Secondary | ICD-10-CM

## 2022-11-13 DIAGNOSIS — G546 Phantom limb syndrome with pain: Secondary | ICD-10-CM | POA: Diagnosis present

## 2022-11-13 DIAGNOSIS — Z23 Encounter for immunization: Secondary | ICD-10-CM

## 2022-11-13 DIAGNOSIS — Z923 Personal history of irradiation: Secondary | ICD-10-CM

## 2022-11-13 DIAGNOSIS — Z791 Long term (current) use of non-steroidal anti-inflammatories (NSAID): Secondary | ICD-10-CM | POA: Diagnosis not present

## 2022-11-13 DIAGNOSIS — T8459XD Infection and inflammatory reaction due to other internal joint prosthesis, subsequent encounter: Secondary | ICD-10-CM

## 2022-11-13 DIAGNOSIS — Z79899 Other long term (current) drug therapy: Secondary | ICD-10-CM

## 2022-11-13 DIAGNOSIS — F419 Anxiety disorder, unspecified: Secondary | ICD-10-CM | POA: Diagnosis not present

## 2022-11-13 DIAGNOSIS — I82412 Acute embolism and thrombosis of left femoral vein: Secondary | ICD-10-CM | POA: Diagnosis not present

## 2022-11-13 LAB — VANCOMYCIN, TROUGH: Vancomycin Tr: 8 ug/mL — ABNORMAL LOW (ref 15–20)

## 2022-11-13 LAB — VANCOMYCIN, PEAK: Vancomycin Pk: 24 ug/mL — ABNORMAL LOW (ref 30–40)

## 2022-11-13 MED ORDER — OXYCODONE HCL 5 MG PO TABS
5.0000 mg | ORAL_TABLET | ORAL | Status: DC | PRN
  Administered 2022-11-17: 5 mg via ORAL
  Filled 2022-11-13: qty 1

## 2022-11-13 MED ORDER — ALUM & MAG HYDROXIDE-SIMETH 200-200-20 MG/5ML PO SUSP: 30.0000 mL | ORAL | Status: DC | PRN

## 2022-11-13 MED ORDER — OXYCODONE HCL 5 MG PO TABS
10.0000 mg | ORAL_TABLET | ORAL | Status: DC | PRN
  Administered 2022-11-13 – 2022-11-15 (×7): 15 mg via ORAL
  Administered 2022-11-15 – 2022-11-23 (×34): 10 mg via ORAL
  Filled 2022-11-13 (×6): qty 2
  Filled 2022-11-13: qty 3
  Filled 2022-11-13 (×5): qty 2
  Filled 2022-11-13: qty 3
  Filled 2022-11-13 (×2): qty 2
  Filled 2022-11-13: qty 3
  Filled 2022-11-13 (×7): qty 2
  Filled 2022-11-13 (×2): qty 3
  Filled 2022-11-13 (×4): qty 2
  Filled 2022-11-13: qty 3
  Filled 2022-11-13 (×3): qty 2
  Filled 2022-11-13: qty 3
  Filled 2022-11-13: qty 2
  Filled 2022-11-13 (×2): qty 3
  Filled 2022-11-13 (×5): qty 2

## 2022-11-13 MED ORDER — INFLUENZA VAC A&B SURF ANT ADJ 0.5 ML IM SUSY
0.5000 mL | PREFILLED_SYRINGE | INTRAMUSCULAR | Status: AC
  Administered 2022-11-14: 0.5 mL via INTRAMUSCULAR
  Filled 2022-11-13: qty 0.5

## 2022-11-13 MED ORDER — POLYETHYLENE GLYCOL 3350 17 G PO PACK: 17.0000 g | PACK | Freq: Every day | ORAL | Status: DC | PRN

## 2022-11-13 MED ORDER — PANTOPRAZOLE SODIUM 40 MG PO TBEC
40.0000 mg | DELAYED_RELEASE_TABLET | Freq: Every day | ORAL | Status: DC
  Administered 2022-11-14 – 2022-11-23 (×10): 40 mg via ORAL
  Filled 2022-11-13 (×10): qty 1

## 2022-11-13 MED ORDER — JUVEN PO PACK
1.0000 | PACK | Freq: Two times a day (BID) | ORAL | Status: DC
  Administered 2022-11-14 – 2022-11-23 (×19): 1 via ORAL
  Filled 2022-11-13 (×18): qty 1

## 2022-11-13 MED ORDER — ENOXAPARIN SODIUM 40 MG/0.4ML IJ SOSY
40.0000 mg | PREFILLED_SYRINGE | INTRAMUSCULAR | Status: DC
  Administered 2022-11-13: 40 mg via SUBCUTANEOUS
  Filled 2022-11-13: qty 0.4

## 2022-11-13 MED ORDER — SORBITOL 70 % SOLN: 30.0000 mL | Freq: Every day | Status: DC | PRN

## 2022-11-13 MED ORDER — GABAPENTIN 300 MG PO CAPS
300.0000 mg | ORAL_CAPSULE | Freq: Every day | ORAL | Status: DC
  Administered 2022-11-13 – 2022-11-18 (×6): 300 mg via ORAL
  Filled 2022-11-13 (×6): qty 1

## 2022-11-13 MED ORDER — GUAIFENESIN-DM 100-10 MG/5ML PO SYRP: 10.0000 mL | ORAL_SOLUTION | Freq: Four times a day (QID) | ORAL | Status: DC | PRN

## 2022-11-13 MED ORDER — FLEET ENEMA RE ENEM: 1.0000 | ENEMA | Freq: Once | RECTAL | Status: DC | PRN

## 2022-11-13 MED ORDER — ACETAMINOPHEN 325 MG PO TABS
325.0000 mg | ORAL_TABLET | ORAL | Status: DC | PRN
  Administered 2022-11-14 – 2022-11-17 (×7): 650 mg via ORAL
  Administered 2022-11-18: 325 mg via ORAL
  Administered 2022-11-18 – 2022-11-21 (×6): 650 mg via ORAL
  Filled 2022-11-13 (×14): qty 2

## 2022-11-13 MED ORDER — VITAMIN C 500 MG PO TABS
1000.0000 mg | ORAL_TABLET | Freq: Every day | ORAL | Status: DC
  Administered 2022-11-14 – 2022-11-23 (×10): 1000 mg via ORAL
  Filled 2022-11-13 (×10): qty 2

## 2022-11-13 MED ORDER — SODIUM CHLORIDE 0.9% FLUSH: 10.0000 mL | INTRAVENOUS | Status: DC | PRN

## 2022-11-13 MED ORDER — SODIUM CHLORIDE 0.9 % IV SOLN
2.0000 g | INTRAVENOUS | Status: DC
  Administered 2022-11-14 – 2022-11-23 (×10): 2 g via INTRAVENOUS
  Filled 2022-11-13 (×10): qty 20

## 2022-11-13 MED ORDER — ONDANSETRON HCL 4 MG PO TABS: 4.0000 mg | ORAL_TABLET | Freq: Four times a day (QID) | ORAL | Status: DC | PRN

## 2022-11-13 MED ORDER — DIPHENHYDRAMINE HCL 25 MG PO CAPS: 25.0000 mg | ORAL_CAPSULE | Freq: Four times a day (QID) | ORAL | Status: DC | PRN

## 2022-11-13 MED ORDER — ANASTROZOLE 1 MG PO TABS
1.0000 mg | ORAL_TABLET | Freq: Every day | ORAL | Status: DC
  Administered 2022-11-14 – 2022-11-23 (×10): 1 mg via ORAL
  Filled 2022-11-13 (×10): qty 1

## 2022-11-13 MED ORDER — METHOCARBAMOL 500 MG PO TABS
500.0000 mg | ORAL_TABLET | Freq: Four times a day (QID) | ORAL | Status: DC | PRN
  Administered 2022-11-14 – 2022-11-23 (×13): 500 mg via ORAL
  Filled 2022-11-13 (×14): qty 1

## 2022-11-13 MED ORDER — VANCOMYCIN HCL 750 MG/150ML IV SOLN
750.0000 mg | Freq: Two times a day (BID) | INTRAVENOUS | Status: DC
  Administered 2022-11-13 – 2022-11-23 (×20): 750 mg via INTRAVENOUS
  Filled 2022-11-13 (×22): qty 150

## 2022-11-13 MED ORDER — CHLORHEXIDINE GLUCONATE CLOTH 2 % EX PADS: 6.0000 | MEDICATED_PAD | Freq: Every day | CUTANEOUS | Status: DC

## 2022-11-13 MED ORDER — HYDRALAZINE HCL 25 MG PO TABS
25.0000 mg | ORAL_TABLET | Freq: Two times a day (BID) | ORAL | Status: DC
  Administered 2022-11-13 – 2022-11-17 (×8): 25 mg via ORAL
  Filled 2022-11-13 (×8): qty 1

## 2022-11-13 MED ORDER — BUPROPION HCL ER (SR) 150 MG PO TB12
150.0000 mg | ORAL_TABLET | Freq: Two times a day (BID) | ORAL | Status: DC
  Administered 2022-11-14 – 2022-11-23 (×19): 150 mg via ORAL
  Filled 2022-11-13 (×19): qty 1

## 2022-11-13 MED ORDER — AMLODIPINE BESYLATE 10 MG PO TABS
10.0000 mg | ORAL_TABLET | Freq: Every day | ORAL | Status: DC
  Administered 2022-11-14 – 2022-11-23 (×10): 10 mg via ORAL
  Filled 2022-11-13 (×10): qty 1

## 2022-11-13 MED ORDER — ONDANSETRON HCL 4 MG/2ML IJ SOLN: 4.0000 mg | Freq: Four times a day (QID) | INTRAMUSCULAR | Status: DC | PRN

## 2022-11-13 MED ORDER — MELATONIN 5 MG PO TABS
5.0000 mg | ORAL_TABLET | Freq: Every day | ORAL | Status: DC
  Administered 2022-11-13 – 2022-11-22 (×10): 5 mg via ORAL
  Filled 2022-11-13 (×10): qty 1

## 2022-11-13 MED ORDER — CEFTRIAXONE IV (FOR PTA / DISCHARGE USE ONLY): 2.0000 g | INTRAVENOUS | 0 refills | Status: DC

## 2022-11-13 MED ORDER — VANCOMYCIN IV (FOR PTA / DISCHARGE USE ONLY): 750.0000 mg | Freq: Two times a day (BID) | INTRAVENOUS | 0 refills | Status: DC

## 2022-11-13 MED ORDER — SENNOSIDES-DOCUSATE SODIUM 8.6-50 MG PO TABS
1.0000 | ORAL_TABLET | Freq: Every day | ORAL | Status: DC
  Administered 2022-11-13 – 2022-11-20 (×8): 1 via ORAL
  Filled 2022-11-13 (×10): qty 1

## 2022-11-13 MED ORDER — ZINC SULFATE 220 (50 ZN) MG PO CAPS
220.0000 mg | ORAL_CAPSULE | Freq: Every day | ORAL | Status: AC
  Administered 2022-11-14 – 2022-11-17 (×4): 220 mg via ORAL
  Filled 2022-11-13 (×4): qty 1

## 2022-11-13 NOTE — Progress Notes (Signed)
[facility] IP rehab admissions - I have insurance approval and I have MD clearance for acute inpatient rehab admission today.  Bed available and will admit today.  Call for questions.  [phone]

## 2022-11-13 NOTE — Progress Notes (Signed)
Pharmacy Antibiotic Note  Tammy [name] is a 67 y.o. 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 S/P AKA 9/25 though some of the intramedullary implant was unable to be resected.  Pharmacy has been consulted for vancomycin. ID following and plans for antibiotics until 12/15/22  Vancomycin peak = 24 Vancomycin trough = 8 Calculated AUC 400  Plan: Cont Vancomycin 750mg  IV q12h  Also on ceftriaxone 2 gm every 24 hours Monitor culture data and renal function  Check vancomycin levels next week  Height: 5\' 2"  (157.5 cm) Weight: 66.7 kg (147 lb 0.8 oz) IBW/kg (Calculated) : 50.1  Temp (24hrs), Avg:98.2 F (36.8 C), Min:97.8 F (36.6 C), Max:98.6 F (37 C)  Recent Labs  Lab 11/09/22 1816 11/09/22 2023 11/13/22 1026 11/13/22 2140  VANCOTROUGH 5*  --   --  8*  VANCOPEAK  --  35 24*  --     Estimated Creatinine Clearance: 60.2 mL/min (by C-G formula based on SCr of 0.58 mg/dL).    Allergies  Allergen Reactions   Lisinopril Cough    Ulyses Southward, PharmD, BCIDP, AAHIVP, CPP Infectious Disease Pharmacist 11/13/2022 10:37 PM

## 2022-11-13 NOTE — H&P (Signed)
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  Patient eventually had the hardware removed in March 2024 with irrigation and debridement with Dr. [name] 3 separate times as well as distal femur replacement with total knee arthroplasty.  Patient has undergone 2 wound debridements and application of myriad with Dr. Ulice Bold on May 6 and August 5. She continued to have purulent drainage in the wound vac; and the wound did not heal with significant pain in the infected area. Skin graft trial previously from plastic surgery failed as well. The hardware remained exposed. She underwent left AKA by Dr. [name] on 11/04/2022. Wound VAC removed 10/02. The amputation site wound cultures remain negative. She will continue on vancomycin and ceftriaxone through 11/05 and follow with ID. Tolerating diet. She continues to progress ambulation in the hallway with RW CGA/Min A. The patient requires inpatient medicine and rehabilitation evaluations and services for ongoing dysfunction secondary to left AKA.   OOB to chair. Requesting to return to pre-admission dose of gabapentin to 300 mg q HS.    Review of Systems  Constitutional:  Negative for chills and fever.  HENT:  Negative for hearing loss and sore throat.   Eyes:  Negative for blurred vision and double vision.  Respiratory:  Negative for cough and shortness of breath.   Cardiovascular:  Negative for chest pain and palpitations.  Gastrointestinal:  Negative for abdominal pain and nausea.  Genitourinary:  Negative for dysuria and urgency.  Musculoskeletal:  Positive for myalgias.       Complaning of phantom pain: burning knee pain and ankle cramps  Neurological:  Negative for dizziness and headaches.  Psychiatric/Behavioral:  Negative for depression. The patient is not nervous/anxious.          Past Medical History:  Diagnosis Date   Anemia      hx of   Anxiety      on meds   Arthritis      back/bilateral ankles   Bipolar disorder (HCC)     Breast cancer (HCC) 2022    LEFT breast-radiation   Depression      on meds   Family history of breast cancer 07/10/2020   Family history of colon cancer 07/10/2020   Family history of ovarian cancer 07/10/2020   GERD (gastroesophageal reflux disease)      on meds   Heart murmur      dx by PCP   Hepatitis C     HTN (hypertension)      on meds   Hx of radiation therapy     Liver cirrhosis (HCC)      from hep c   Neuromuscular disorder (HCC)     Osteopenia     Personal history of radiation therapy     Stroke (HCC) 09/2012    slurred speech and LEFT sided weakness/notes 10/04/2012             Past Surgical History:  Procedure Laterality Date   AMPUTATION Left 11/04/2022    Procedure: LEFT ABOVE KNEE AMPUTATION;  Surgeon: Nadara Mustard, MD;  Location: [facility] OR;  Service: Orthopedics;  Laterality: Left;   ANKLE FRACTURE SURGERY Left 2018   APPLICATION OF WOUND VAC Left 06/15/2022    Procedure: APPLICATION OF WOUND VAC;  Surgeon: Peggye Form, DO;  Location: [facility] ORS;  Service: Government social research officer;  Laterality: Left;   APPLICATION OF WOUND VAC Left 09/14/2022    Procedure: APPLICATION OF WOUND VAC;  Surgeon: Peggye Form, DO;  Location: [facility] OR;  Service: Plastics;  Laterality: Left;   BREAST LUMPECTOMY Left 07/2020   BREAST LUMPECTOMY WITH RADIOACTIVE SEED AND SENTINEL LYMPH NODE BIOPSY Left 07/26/2020    Procedure: LEFT BREAST LUMPECTOMY WITH RADIOACTIVE SEED AND SENTINEL LYMPH NODE BIOPSY;  Surgeon: Abigail Miyamoto, MD;  Location: [facility];  Service: General;  Laterality: Left;   CESAREAN SECTION   1986   COLONOSCOPY       DIAGNOSTIC LAPAROSCOPY   1985   HARDWARE REMOVAL Left 04/30/2022    Procedure: HARDWARE REMOVAL LEFT FEMUR;  Surgeon: Samson Frederic, MD;  Location: [facility];  Service:  Orthopedics;  Laterality: Left;  150   HARDWARE REMOVAL Left 11/04/2022    Procedure: HARDWARE REMOVAL INFECTED LEFT TOTAL KNEE ARTHROPLASTY;  Surgeon: Nadara Mustard, MD;  Location: [facility] OR;  Service: Orthopedics;  Laterality: Left;   I & D KNEE WITH POLY EXCHANGE Left 06/07/2022    Procedure: IRRIGATION AND DEBRIDEMENT KNEE WITH POLY EXCHANGE;  Surgeon: Samson Frederic, MD;  Location: [facility];  Service: Orthopedics;  Laterality: Left;   INCISION AND DRAINAGE OF WOUND Left 06/04/2022    Procedure: IRRIGATION AND DEBRIDEMENT KNEE;  Surgeon: Samson Frederic, MD;  Location: [facility];  Service: Orthopedics;  Laterality: Left;  60   INCISION AND DRAINAGE OF WOUND Left 06/15/2022    Procedure: excision of knee wound with Myriad;  Surgeon: Peggye Form, DO;  Location: [facility];  Service: Plastics;  Laterality: Left;   ORIF FEMUR FRACTURE Left 04/10/2021    Procedure: OPEN REDUCTION INTERNAL FIXATION (ORIF) DISTAL FEMUR FRACTURE;  Surgeon: Myrene Galas, MD;  Location: [facility] OR;  Service: Orthopedics;  Laterality: Left;   PARTIAL KNEE ARTHROPLASTY Left 04/30/2022    Procedure: DISTAL FEMUR REPLACEMENT;  Surgeon: Samson Frederic, MD;  Location: [facility];  Service: Orthopedics;  Laterality: Left;  150             Family History  Problem Relation Age of Onset   Breast cancer Mother     Other Mother          blood clots   Diabetes Sister     Neuropathy Sister          diabetic neuropathy   Breast cancer Maternal Aunt          9   Colon cancer Maternal Aunt 61   Diabetes Paternal Aunt     Diabetes Brother     Neuropathy Brother          diabetic neuropathy   Breast cancer Other          PGM's sister; dx mid 77s   Colon cancer Other          PGM's brother; dx 48s   Ovarian cancer Other          PGM's mother; dx unknown age   Esophageal cancer Neg Hx     Rectal cancer Neg Hx     Stomach cancer Neg Hx     Colon polyps Neg Hx          Social History:  reports that she has quit smoking. Her  smoking use included cigarettes. She has a 60 pack-year smoking history. She has never used smokeless tobacco. She reports that she does not currently use alcohol. She reports that she does not use drugs.  Allergies:  Allergies      Allergies  Allergen Reactions   Lisinopril Cough            Medications Prior to Admission  Medication Sig Dispense Refill   acetaminophen (TYLENOL) 500 MG tablet Take 2 tablets (1,000 mg total) by mouth every 8 (eight) hours as needed for moderate pain, mild pain, fever or headache. 30 tablet 0   amLODipine (NORVASC) 10 MG tablet Take 1 tablet (10 mg total) by mouth daily. 30 tablet 0   amoxicillin (AMOXIL) 500 MG capsule Take 2 capsules (1,000 mg total) by mouth 3 (three) times daily. 180 capsule 11   anastrozole (ARIMIDEX) 1 MG tablet TAKE 1 TABLET BY MOUTH DAILY 90 tablet 3   Ascorbic Acid (VITAMIN C) 1000 MG tablet Take 1,000 mg by mouth daily.       buPROPion (WELLBUTRIN SR) 150 MG 12 hr tablet Take 150 mg by mouth 2 (two) times daily.       calcium carbonate (OS-CAL - DOSED IN MG OF ELEMENTAL CALCIUM) 1250 (500 Ca) MG tablet Take 1 tablet by mouth daily.       Cyanocobalamin (VITAMIN B-12 PO) Take 2,000 mcg by mouth daily.       gabapentin (NEURONTIN) 100 MG capsule Take 1 capsule (100 mg total) by mouth at bedtime. For hot flushes 30 capsule 1   hydrALAZINE (APRESOLINE) 25 MG tablet Take 1 tablet (25 mg total) by mouth 2 (two) times daily. 60 tablet 0   HYDROcodone-acetaminophen (NORCO) 10-325 MG tablet Take 1 tablet by mouth every 4 (four) hours as needed for severe pain or moderate pain.       meloxicam (MOBIC) 15 MG tablet Take 1 tablet (15 mg total) by mouth daily. (Patient taking differently: Take 15 mg by mouth daily as needed for pain.) 30 tablet 2   Menthol, Topical Analgesic, (BENGAY EX) Apply 1 Application topically daily as needed (pain).       pantoprazole (PROTONIX) 40 MG tablet Take 1 tablet (40 mg total) by mouth daily. 30 tablet 0    potassium chloride (KLOR-CON) 10 MEQ tablet Take 10 mEq by mouth daily.       Prenatal Vit-Fe Fumarate-FA (PRENATAL PO) Take 1 tablet by mouth daily.       promethazine (PHENERGAN) 12.5 MG tablet Take 1 tablet (12.5 mg total) by mouth every 8 (eight) hours as needed for nausea or vomiting. 20 tablet 1   zinc gluconate 50 MG tablet Take 50 mg by mouth daily.                  Home: Home Living Family/patient expects to be discharged to:: Private residence Living Arrangements: Spouse/significant other Available Help at Discharge: Family, Friend(s), Available PRN/intermittently Type of Home: Mobile home Home Access: Stairs to enter Secretary/administrator of Steps: 5 Entrance Stairs-Rails: Can reach both, Left, Right Home Layout: One level Bathroom Shower/Tub: Engineer, manufacturing systems: Standard Bathroom Accessibility: Yes Home Equipment: Agricultural consultant (2 wheels), Rollator (4 wheels), Shower seat, Hand held shower head, Toilet riser   Functional History: Prior Function Prior Level of Function : Independent/Modified Independent Mobility Comments: Mod I RW ADLs Comments: independent, used shower seat   Functional Status:  Mobility: Bed Mobility Overal bed mobility: Needs Assistance Bed Mobility: Supine to Sit Supine to sit: Mod assist, Used rails, HOB elevated General bed mobility comments: Heavy use of bed pads. Pt able to sit up on her own. Transfers Overall transfer level: Needs assistance Equipment used: Rolling  walker (2 wheels) Transfers: Sit to/from Stand Sit to Stand: +2 physical assistance, Min assist Bed to/from chair/wheelchair/BSC transfer type:: Stand pivot Stand pivot transfers: +2 physical assistance, Min assist General transfer comment: Cues for hand placement. Ambulation/Gait Ambulation/Gait assistance: +2 physical assistance, Min assist Gait Distance (Feet): 5 Feet Assistive device: Rolling walker (2 wheels) Gait Pattern/deviations:  (hop  to) General Gait Details: Pt able to hop to the chair. no overt LOB noted. Pt also able to stand for prolonged time while therapist untangled IV line. Gait velocity: decreased   ADL: ADL Overall ADL's : Needs assistance/impaired Eating/Feeding: Set up, Sitting Grooming: Set up, Sitting Upper Body Bathing: Set up, Sitting Lower Body Bathing: Moderate assistance, Sitting/lateral leans Upper Body Dressing : Set up, Sitting Lower Body Dressing: Maximal assistance, Sitting/lateral leans Toilet Transfer: +2 for physical assistance, Minimal assistance Toileting- Clothing Manipulation and Hygiene: Moderate assistance, Sitting/lateral lean General ADL Comments: Pt requires assistance for LB ADLs, difficulty balancing in sitting position,  good overal BUE strength/ROM to complete UB ADLs   Cognition: Cognition Overall Cognitive Status: Within Functional Limits for tasks assessed Orientation Level: Oriented X4 Cognition Arousal: Alert Behavior During Therapy: WFL for tasks assessed/performed Overall Cognitive Status: Within Functional Limits for tasks assessed   Physical Exam: Blood pressure (!) 150/72, pulse 64, temperature 98.2 F (36.8 C), temperature source Oral, resp. rate 18, height 5' 2.5" (1.588 m), weight 68.5 kg, SpO2 100%. Physical Exam Constitutional:      General: She is not in acute distress.    Appearance: Normal appearance.  HENT:     Head: Normocephalic and atraumatic.     Mouth/Throat:     Mouth: Mucous membranes are moist.  Eyes:     Extraocular Movements: Extraocular movements intact.     Conjunctiva/sclera: Conjunctivae normal.     Pupils: Pupils are equal, round, and reactive to light.  Cardiovascular:     Rate and Rhythm: Normal rate and regular rhythm.     Heart sounds: No murmur heard.    No gallop.  Pulmonary:     Effort: Pulmonary effort is normal. No respiratory distress.     Breath sounds: Normal breath sounds. No wheezing.  Abdominal:     General:  Bowel sounds are normal. There is no distension.     Palpations: Abdomen is soft.     Tenderness: There is no abdominal tenderness.  Genitourinary:    Comments: Clear, pale yellow urine Musculoskeletal:     Cervical back: Normal range of motion.     Comments: Left AKA swollen and tender to touch. Mild crepitus in right knee with PROM, but no pain or effusions appreciated, normal alignment  Skin:    Comments: Left AKA incision dressed with minimal drainage visible underneath perforated dressing. Has foam dressing over the anterior portion of thigh protecting some abraded areas.   Neurological:     Mental Status: She is alert.     Comments: Alert and oriented x 3. Normal insight and awareness. Intact Memory. Normal language and speech. Cranial nerve exam unremarkable. MMT: UE 5/5. RLE 5/5. LLE 2/5 d/t pain. Sensory exam normal for light touch and pain in all 4 limbs. No limb ataxia or cerebellar signs. No abnormal tone appreciated.  [name]    Psychiatric:        Mood and Affect: Mood normal.        Behavior: Behavior normal.        Lab Results Last 48 Hours  No results found for this or any previous  visit (from the past 48 hour(s)).   Imaging Results (Last 48 hours)  No results found.         Blood pressure (!) 150/72, pulse 64, temperature 98.2 F (36.8 C), temperature source Oral, resp. rate 18, height 5' 2.5" (1.588 m), weight 68.5 kg, SpO2 100%.   Medical Problem List and Plan: 1. 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  Antithrombotics: -DVT/anticoagulation:  Pharmaceutical: Lovenox>>start -no pharmacologic DVT prophy on acute: check BLE venous duplex             -antiplatelet therapy: none   3. Pain Management: Tylenol, oxycodone as needed             -increase  gabapentin to 300 mg q HS (for phantom pain)             -discussed massage/tactile feedback, vision/mirror therapy 4. Mood/Behavior/Sleep: LCSW to evaluate and provide emotional support             -history of bipolar: continue Wellbutrin SR 150 mg BID             -antipsychotic agents: n/a   5. Neuropsych/cognition: This patient is capable of making decisions on her own behalf.             -pt appears very positive and motivated to regain functional mobility 6. Skin/Wound Care: Routine skin care checks             -local dressing to incision, change daily prn.              -foam dressing to abrasions on anterior thigh 7. Fluids/Electrolytes/Nutrition: Routine Is and Os and follow-up chemistries             -continue Vit C, zinc supplements   8: Hypertension: monitor TID and prn             -continue amlodipine 10 mg daily             -continue hydralazine 25 mg BID   9: Hyperlipidemia: continue statin   10: Left AKA 9/25 Dr. 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 Renold Don    12: History of breast cancer on Arimadex 1 mg daily   13: Hypokalemia: 3.0 on 9/27>>follow-up BMP   14: Anemia: acute blood loss atop chronic             -follow-up CBC   15: GERD: continue PPI      Milinda Antis, PA-C 11/13/2022  I have personally performed a face to face diagnostic evaluation of this patient and formulated the key components of the plan.  Additionally, I have personally reviewed laboratory data, imaging studies, as well as relevant notes and concur with the physician assistant's documentation above.  The patient's status has not changed from the original H&P.  Any changes in documentation from the acute care chart  have been noted above.  Ranelle Oyster, MD, [place] [facility]

## 2022-11-13 NOTE — Plan of Care (Signed)
  Problem: Consults Goal: RH LIMB LOSS PATIENT EDUCATION Description: Description: See Patient Education module for eduction specifics. Outcome: Progressing   Problem: RH BOWEL ELIMINATION Goal: RH STG MANAGE BOWEL WITH ASSISTANCE Description: STG Manage Bowel with toileting Assistance. Outcome: Progressing Goal: RH STG MANAGE BOWEL W/MEDICATION W/ASSISTANCE Description: STG Manage Bowel with Medication with mod I  Assistance. Outcome: Progressing   Problem: RH SKIN INTEGRITY Goal: RH STG SKIN FREE OF INFECTION/BREAKDOWN Description: Manage w min assist Outcome: Progressing Goal: RH STG ABLE TO PERFORM INCISION/WOUND CARE W/ASSISTANCE Description: STG Able To Perform Incision/Wound Care With min Assistance. Outcome: Progressing   Problem: RH SAFETY Goal: RH STG ADHERE TO SAFETY PRECAUTIONS W/ASSISTANCE/DEVICE Description: STG Adhere to Safety Precautions With cues Assistance/Device. Outcome: Progressing   Problem: RH PAIN MANAGEMENT Goal: RH STG PAIN MANAGED AT OR BELOW PT'S PAIN GOAL Description: < 4 with prns Outcome: Progressing   Problem: RH KNOWLEDGE DEFICIT LIMB LOSS Goal: RH STG INCREASE KNOWLEDGE OF SELF CARE AFTER LIMB LOSS Description: Patient and S.O. will be able to manage care at discharge using educational resources independently Outcome: Progressing   Problem: Education: Goal: Knowledge of the prescribed therapeutic regimen will improve Outcome: Progressing Goal: Ability to verbalize activity precautions or restrictions will improve Outcome: Progressing Goal: Understanding of discharge needs will improve Outcome: Progressing   Problem: Activity: Goal: Ability to perform//tolerate increased activity and mobilize with assistive devices will improve Outcome: Progressing   Problem: Clinical Measurements: Goal: Postoperative complications will be avoided or minimized Outcome: Progressing   Problem: Self-Care: Goal: Ability to meet self-care needs will  improve Outcome: Progressing   Problem: Self-Concept: Goal: Ability to maintain and perform role responsibilities to the fullest extent possible will improve Outcome: Progressing   Problem: Pain Management: Goal: Pain level will decrease with appropriate interventions Outcome: Progressing   Problem: Skin Integrity: Goal: Demonstration of wound healing without infection will improve Outcome: Progressing   Problem: Education: Goal: Knowledge of the prescribed therapeutic regimen will improve Outcome: Progressing Goal: Individualized Educational Video(s) Outcome: Progressing   Problem: Activity: Goal: Ability to avoid complications of mobility impairment will improve Outcome: Progressing Goal: Range of joint motion will improve Outcome: Progressing   Problem: Clinical Measurements: Goal: Postoperative complications will be avoided or minimized Outcome: Progressing   Problem: Pain Management: Goal: Pain level will decrease with appropriate interventions Outcome: Progressing   Problem: Skin Integrity: Goal: Will show signs of wound healing Outcome: Progressing

## 2022-11-13 NOTE — Telephone Encounter (Signed)
[facility] called today stating It is a bed in inpatient rehab ready for pt today

## 2022-11-13 NOTE — Progress Notes (Signed)
PMR Admission Coordinator Pre-Admission Assessment   Patient: Tammy [name] is an 67 y.o., female MRN: [id number] DOB: 1955-07-03 Height: 5' 2.5" (158.8 cm) Weight: 68.5 kg                                                                                                                                                  Insurance Information HMO: YES    PPO:      PCP:      IPA:      80/20:      OTHER: POS group [id number] PRIMARY: UHC Medicare      Policy#: [id number] and medicare # [id number]   Subscriber: patient CM Name: Myrlene Broker      Phone#: [phone]   Fax#: [phone] Pre-Cert#: [id number] approved from 11/12/22 to 11/18/22 with update due 11/18/22       Employer: Retired Benefits:  Phone #: [phone]     Name: [email or web address] on line portal Eff. Date: 02/09/22     Deduct: $0      Out of Pocket Max: $3600 (met $3600)      Life Max: n/a  CIR: $295 for days 1-5      SNF: 40 for days 1-20; $203 for days 21-100 Outpatient: med nec     Co-Pay: $20/visit Home Health: 100%      Co-Pay: none DME: 80%     Co-Pay: 20% Providers: in network  SECONDARY:       Policy#:       Phone#:    Artist:       Phone#:    The Data processing manager" for patients in Inpatient Rehabilitation Facilities with attached "Privacy Act Statement-Health Care Records" was provided and verbally reviewed with: Patient   Emergency Contact Information Contact Information       Name Relation Home Work Mobile    Branch,[name] Significant other [phone]   [phone]    Christy Sartorius Daughter [phone]   [phone]    Trivett,Loressa Daughter [phone]   [phone]    Sherlon Handing Sister     [phone]         Other Contacts   None on File      Current Medical History  Patient Admitting Diagnosis: L AKA   History of Present Illness: A 67 y.o. 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  [name].  PT/OT evaluations completed with recommendations for acute inpatient rehab admission.    Patient's medical record from [facility] has been reviewed by the rehabilitation admission coordinator and physician.   Past Medical History      Past Medical History:  Diagnosis Date   Anemia      hx of   Anxiety      on meds   Arthritis      back/bilateral ankles   Bipolar disorder (HCC)     Breast cancer (HCC) 2022    LEFT breast-radiation   Depression      on meds   Family history of breast cancer 07/10/2020   Family history of colon cancer 07/10/2020   Family history of ovarian cancer 07/10/2020   GERD (gastroesophageal reflux disease)      on meds   Heart murmur      dx by PCP   Hepatitis C     HTN (hypertension)      on meds   Hx of radiation therapy     Liver cirrhosis (HCC)      from hep c   Neuromuscular disorder (HCC)     Osteopenia     Personal history of radiation therapy     Stroke (HCC) 09/2012    slurred speech and LEFT sided weakness/notes 10/04/2012          Has the patient had major surgery during 100 days prior to admission? Yes   Family History  family history includes Breast cancer in her maternal aunt, mother, and another family member; Colon cancer in an other family member; Colon cancer (age of onset: 49) in her maternal aunt; Diabetes in her brother, paternal aunt, and sister; Neuropathy in her brother and sister; Other in her mother; Ovarian cancer in an other family member.     Current  Medications   Current Medications    Current Facility-Administered Medications:    acetaminophen (TYLENOL) tablet 325-650 mg, 325-650 mg, Oral, Q6H PRN, Nadara Mustard, MD, 325 mg at 11/13/22 1045   alum & mag hydroxide-simeth (MAALOX/MYLANTA) 200-200-20 MG/5ML suspension 15-30 mL, 15-30 mL, Oral, Q2H PRN, Nadara Mustard, MD   amLODipine (NORVASC) tablet 10 mg, 10 mg, Oral, Daily, Nadara Mustard, MD, 10 mg at 11/13/22 4098   anastrozole (ARIMIDEX) tablet 1 mg, 1 mg, Oral, Daily, Nadara Mustard, MD, 1 mg at 11/13/22 1191   ascorbic acid (VITAMIN C) tablet 1,000 mg, 1,000 mg, Oral, Daily, Nadara Mustard, MD, 1,000 mg at 11/13/22 4782   bisacodyl (DULCOLAX) EC tablet 5 mg, 5 mg, Oral, Daily PRN, Nadara Mustard, MD, 5 mg at 11/10/22 9562   buPROPion [facility] SR) 12 hr tablet 150 mg, 150 mg, Oral, BID, Nadara Mustard, MD, 150 mg at 11/13/22 1308   cefTRIAXone (ROCEPHIN) 2 g in sodium chloride 0.9 % 100 mL IVPB, 2 g, Intravenous, Q24H, Judyann Munson, MD, Last Rate: 200 mL/hr at 11/12/22 1746, 2 g at 11/12/22 1746   Chlorhexidine Gluconate Cloth 2 % PADS 6 each, 6 each, Topical, Daily, Nadara Mustard, MD, 6 each at 11/13/22 0809   docusate sodium (COLACE) capsule 100 mg, 100 mg, Oral, Daily, Nadara Mustard, MD, 100 mg at 11/13/22 6578   gabapentin (NEURONTIN) capsule 100 mg, 100 mg, Oral, QHS, Nadara Mustard, MD, 100 mg at 11/12/22 2112   guaiFENesin-dextromethorphan (ROBITUSSIN DM) 100-10 MG/5ML syrup 15 mL, 15 mL, Oral, Q4H PRN, Nadara Mustard, MD   hydrALAZINE (APRESOLINE) injection  5 mg, 5 mg, Intravenous, Q20 Min PRN, Nadara Mustard, MD   hydrALAZINE (APRESOLINE) tablet 25 mg, 25 mg, Oral, BID, Nadara Mustard, MD, 25 mg at 11/13/22 0347   HYDROmorphone (DILAUDID) injection 0.5-1 mg, 0.5-1 mg, Intravenous, Q4H PRN, Nadara Mustard, MD, 1 mg at 11/13/22 0059   labetalol (NORMODYNE) injection 10 mg, 10 mg, Intravenous, Q10 min PRN, Nadara Mustard, MD, 10 mg at 11/13/22 0452   magnesium citrate  solution 1 Bottle, 1 Bottle, Oral, Once PRN, Nadara Mustard, MD   magnesium sulfate IVPB 2 g 50 mL, 2 g, Intravenous, Daily PRN, Nadara Mustard, MD   metoprolol tartrate (LOPRESSOR) injection 2-5 mg, 2-5 mg, Intravenous, Q2H PRN, Nadara Mustard, MD   nutrition supplement (JUVEN) (JUVEN) powder packet 1 packet, 1 packet, Oral, BID BM, Nadara Mustard, MD, 1 packet at 11/13/22 0806   ondansetron [facility]) injection 4 mg, 4 mg, Intravenous, Q6H PRN, Nadara Mustard, MD   oxyCODONE (Oxy IR/ROXICODONE) immediate release tablet 10-15 mg, 10-15 mg, Oral, Q4H PRN, Nadara Mustard, MD, 15 mg at 11/13/22 0446   oxyCODONE (Oxy IR/ROXICODONE) immediate release tablet 5 mg, 5 mg, Oral, Q4H PRN, Nadara Mustard, MD, 5 mg at 11/06/22 0917   pantoprazole (PROTONIX) EC tablet 40 mg, 40 mg, Oral, Daily, Nadara Mustard, MD, 40 mg at 11/13/22 0807   phenol (CHLORASEPTIC) mouth spray 1 spray, 1 spray, Mouth/Throat, PRN, Nadara Mustard, MD   polyethylene glycol (MIRALAX / GLYCOLAX) packet 17 g, 17 g, Oral, Daily PRN, Nadara Mustard, MD, 17 g at 11/13/22 1041   sodium chloride flush (NS) 0.9 % injection 10-40 mL, 10-40 mL, Intracatheter, PRN, Nadara Mustard, MD   vancomycin (VANCOREADY) IVPB 750 mg/150 mL, 750 mg, Intravenous, Q12H, Rollene Fare, RPH, Last Rate: 150 mL/hr at 11/13/22 0815, 750 mg at 11/13/22 0815   zinc sulfate capsule 220 mg, 220 mg, Oral, Daily, Nadara Mustard, MD, 220 mg at 11/13/22 0807     Patients Current Diet:  Diet Order                  Diet - low sodium heart healthy             Diet regular Room service appropriate? Yes; Fluid consistency: Thin  Diet effective now                         Precautions / Restrictions Precautions Precautions: Fall Restrictions Weight Bearing Restrictions: Yes LLE Weight Bearing: Non weight bearing    Has the patient had 2 or more falls or a fall with injury in the past year?No   Prior Activity Level Limited Community (1-2x/wk): Has been out  limited amounts most recently.   Prior Functional Level Prior Function Prior Level of Function : Independent/Modified Independent Mobility Comments: Mod I RW ADLs Comments: independent, used shower seat   Self Care: Did the patient need help bathing, dressing, using the toilet or eating?  Needed some help   Indoor Mobility: Did the patient need assistance with walking from room to room (with or without device)? Needed some help   Stairs: Did the patient need assistance with internal or external stairs (with or without device)? Needed some help   Functional Cognition: Did the patient need help planning regular tasks such as shopping or remembering to take medications? Independent   Patient Information Are you of Hispanic, Latino/a,or Spanish origin?: A. No, not  of Hispanic, Latino/a, or Spanish origin What is your race?: B. Black or African American Do you need or want an interpreter to communicate with a doctor or health care staff?: 0. No   Patient's Response To:  Health Literacy and Transportation Is the patient able to respond to health literacy and transportation needs?: Yes Health Literacy - How often do you need to have someone help you when you read instructions, pamphlets, or other written material from your doctor or pharmacy?: Never In the past 12 months, has lack of transportation kept you from medical appointments or from getting medications?: No In the past 12 months, has lack of transportation kept you from meetings, work, or from getting things needed for daily living?: No   Home Assistive Devices / Equipment Home Equipment: Agricultural consultant (2 wheels), Rollator (4 wheels), Shower seat, Hand held shower head, Toilet riser   Prior Device Use: Indicate devices/aids used by the patient prior to current illness, exacerbation or injury? Walker Charity fundraiser   Overall Cognitive Status: Within Functional Limits for tasks  assessed Orientation Level: Oriented X4, Oriented to person, Oriented to place, Oriented to time, Oriented to situation    Extremity Assessment (includes Sensation/Coordination)   Upper Extremity Assessment: Overall WFL for tasks assessed  Lower Extremity Assessment: Defer to PT evaluation LLE Deficits / Details: L AKA     ADLs   Overall ADL's : Needs assistance/impaired Eating/Feeding: Set up, Sitting Grooming: Contact guard assist, Standing, Oral care, Wash/dry face Grooming Details (indicate cue type and reason): standing initally, transitioned to sitting on BSC when fatigued Upper Body Bathing: Set up, Sitting Lower Body Bathing: Moderate assistance, Sitting/lateral leans Upper Body Dressing : Set up, Sitting Lower Body Dressing: Maximal assistance, Sit to/from stand Lower Body Dressing Details (indicate cue type and reason): pt requires assist threading L LE, managing clothing in standing Toilet Transfer: Minimal assistance, Ambulation, Rolling walker (2 wheels), BSC/3in1 Toilet Transfer Details (indicate cue type and reason): 3:1 over toilet Toileting- Clothing Manipulation and Hygiene: Minimal assistance, Sit to/from stand Toileting - Clothing Manipulation Details (indicate cue type and reason): using 1 handed technique, able to complete hygiene in standing and lateral leans Functional mobility during ADLs: Minimal assistance, Rolling walker (2 wheels) General ADL Comments: pt completing ADLs at sink after toileting needs, fatigues with prolonged standing and has to transition to sitting     Mobility   Overal bed mobility: Needs Assistance Bed Mobility: Sit to Supine Supine to sit: HOB elevated, Used rails, Min assist Sit to supine: Contact guard assist, HOB elevated, Used rails General bed mobility comments: increased time required, cueing for technique to fully bring L LE back in bed     Transfers   Overall transfer level: Needs assistance Equipment used: Rolling walker (2  wheels) Transfers: Sit to/from Stand Sit to Stand: Contact guard assist, Min assist Bed to/from chair/wheelchair/BSC transfer type:: Step pivot Stand pivot transfers: +2 physical assistance, Min assist Step pivot transfers: Min assist General transfer comment: x3. Initially CGA however Min A once fatigued.     Ambulation / Gait / Stairs / Wheelchair Mobility   Ambulation/Gait Ambulation/Gait assistance: Contact guard assist, Min assist, +2 safety/equipment Gait Distance (Feet): 65 Feet (40+25) Assistive device: Rolling walker (2 wheels) Gait Pattern/deviations:  (hop to) General Gait Details: Chair follow for safety and +2 line management. 1 seated rest break however no overt LOB. Pt mostly CGA however Min A once fatigued. Gait velocity: decreased  Posture / Balance Dynamic Sitting Balance Sitting balance - Comments: dynamically Balance Overall balance assessment: Needs assistance Sitting-balance support: No upper extremity supported, Feet supported, Single extremity supported Sitting balance-Leahy Scale: Fair Sitting balance - Comments: dynamically Standing balance support: Bilateral upper extremity supported, During functional activity, Single extremity supported Standing balance-Leahy Scale: Poor Standing balance comment: relies on at least 1 UE support during ADLS     Special needs/care consideration  IV with PICC in place will need Vancomycin 750mg  IV q12 hours AND Ceftriaxone 2g IV Q24H  End date: 12/15/22.  Has VAC and dressing to post op right AKA site.      Previous Home Environment (from acute therapy documentation) Living Arrangements: Spouse/significant other Available Help at Discharge: Family, Friend(s), Available PRN/intermittently Type of Home: Mobile home Home Layout: One level Home Access: Stairs to enter Entrance Stairs-Rails: Can reach both, Left, Right Entrance Stairs-Number of Steps: 5 Bathroom Shower/Tub: Museum/gallery conservator: Yes Home Care Services: No ([facility] for dressing changes)   Discharge Living Setting Plans for Discharge Living Setting: Mobile Home, Lives with (comment) (Lives with fiance) Type of Home at Discharge: Mobile home (Single wide.) Discharge Home Layout: One level Discharge Home Access: Stairs to enter Entrance Stairs-Rails: Right, Left, Can reach both Entrance Stairs-Number of Steps: 5 Discharge Bathroom Shower/Tub: Tub/shower unit, Curtain Discharge Bathroom Toilet: Standard (Has an over the toilet riser seat for elevated toilet.) Discharge Bathroom Accessibility: Yes How Accessible: Accessible via wheelchair, Accessible via walker Does the patient have any problems obtaining your medications?: No   Social/Family/Support Systems Patient Roles: Parent (Has SO and daughter.) Contact Information: Lynden Oxford - significant other Anticipated Caregiver: [name] and family Ability/Limitations of Caregiver: [name] is retired by works odd jobs. Caregiver Availability: 24/7 Discharge Plan Discussed with Primary Caregiver: Yes Is Caregiver In Agreement with Plan?: Yes Does Caregiver/Family have Issues with Lodging/Transportation while Pt is in Rehab?: No   Goals Patient/Family Goal for Rehab: PT/OT supervision to mod I goals Expected length of stay: 7-10 days Pt/Family Agrees to Admission and willing to participate: Yes Program Orientation Provided & Reviewed with Pt/Caregiver Including Roles  & Responsibilities: Yes   Decrease burden of Care through IP rehab admission: N/A   Possible need for SNF placement upon discharge: Not anticipated   Patient Condition: I have reviewed medical records from [facility], spoken with CM, and patient. I met with patient at the bedside for inpatient rehabilitation assessment.  Patient will benefit from ongoing PT and OT, can actively participate in 3 hours of therapy a day 5 days of the week, and can make  measurable gains during the admission.  Patient will also benefit from the coordinated team approach during an Inpatient Acute Rehabilitation admission.  The patient will receive intensive therapy as well as Rehabilitation physician, nursing, social worker, and care management interventions.  Due to bladder management, bowel management, safety, skin/wound care, disease management, medication administration, pain management, and patient education the patient requires 24 hour a day rehabilitation nursing.  The patient is currently contact guard assist to min assist with mobility and basic ADLs.  Discharge setting and therapy post discharge at home with home health is anticipated.  Patient has agreed to participate in the Acute Inpatient Rehabilitation Program and will admit today.   Diagnosis: left AKA Does the need for close, 24 hr/day medical supervision in concert with the patient's rehab needs make it unreasonable for this patient to be served in a less intensive setting? Yes  Co-Morbidities requiring supervision/potential complications:  -infected left leg/fracture -pain considerations -wound care Due to bladder management, bowel management, safety, skin/wound care, disease management, medication administration, pain management, and patient education, does the patient require 24 hr/day rehab nursing? Yes Does the patient require coordinated care of a physician, rehab nurse, therapy disciplines of PT, OT to address physical and functional deficits in the context of the above medical diagnosis(es)? Yes Addressing deficits in the following areas: balance, endurance, locomotion, strength, transferring, bowel/bladder control, bathing, dressing, feeding, grooming, toileting, and psychosocial support Can the patient actively participate in an intensive therapy program of at least 3 hrs of therapy per day at least 5 days per week? Yes The potential for patient to make measurable gains while on inpatient rehab  is excellent Anticipated functional outcomes upon discharge from inpatient rehab are modified independent  with PT, modified independent with OT at w/c level Estimated rehab length of stay to reach the above functional goals is: 7-10 days Anticipated discharge destination: Home Overall Rehab/Functional Prognosis: excellent Ranelle Oyster, MD 11/13/2022.   Preadmission Screen Completed By:  Trish Mage, RN, 11/13/2022 11:06 AM ______________________________________________________________________   Discussed status with Dr. Riley Kill on10/04/24 at 1000 and received approval for admission today.   Admission Coordinator:  Trish Mage, time1109/Date10/04/24         Revision History

## 2022-11-13 NOTE — Telephone Encounter (Signed)
This is taken care of per [facility]. She SW Dr. [name].

## 2022-11-13 NOTE — Progress Notes (Signed)
Patient ID: Tammy [name], female   DOB: August 08, 1955, 67 y.o.   MRN: [id number] Patient is status post left above knee amputation. Wound vac has been removed. Has dressing in place which is clean dry and intact. Has not bleed through. Complaining of some discomfort and tightness in limb. Mild edema, compartments soft. Approved now for CIR, is awaiting bed placement.   Adonis Huguenin, NP [phone]

## 2022-11-13 NOTE — Progress Notes (Signed)
Physical Therapy Treatment Patient Details Name: Tammy [name] MRN: [id number] DOB: [birth year] Today's Date: 11/13/2022   History of Present Illness Tammy [name] is a 67 y.o. 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 Decision was made that she should undergo AKA to control infection on 9/25.    PT Comments  Pt tolerated treatment well today. Pt able to ambulate to bathroom and back with RW CGA. No change in DC/DME recs at this time. Pt anticipates DC to [facility] today.     If plan is discharge home, recommend the following: A lot of help with walking and/or transfers;A lot of help with bathing/dressing/bathroom;Assistance with cooking/housework;Assist for transportation;Help with stairs or ramp for entrance   Can travel by private vehicle        Equipment Recommendations  Other (comment) (Per accepting facility)    Recommendations for Other Services       Precautions / Restrictions Precautions Precautions: Fall Restrictions Weight Bearing Restrictions: Yes LLE Weight Bearing: Non weight bearing     Mobility  Bed Mobility Overal bed mobility: Needs Assistance Bed Mobility: Supine to Sit     Supine to sit: Contact guard, HOB elevated, Used rails     General bed mobility comments: Increased time required. Encouragement provided after pt said that she couldnt do it.    Transfers Overall transfer level: Needs assistance Equipment used: Rolling walker (2 wheels) Transfers: Sit to/from Stand Sit to Stand: Contact guard assist            General transfer comment: CGA for safety    Ambulation/Gait Ambulation/Gait assistance: Contact guard assist Gait Distance (Feet): 15 Feet Assistive device: Rolling walker (2 wheels) Gait Pattern/deviations:  (hop to) Gait velocity: decreased     General Gait Details: Pt able to ambulate to bathroom and back.   Stairs             Wheelchair Mobility     Tilt Bed    Modified Rankin (Stroke Patients Only)       Balance Overall balance assessment: Needs assistance Sitting-balance support: No upper extremity supported, Feet supported, Single extremity supported Sitting balance-Leahy Scale: Fair Sitting balance - Comments: dynamically   Standing balance support: Bilateral upper extremity supported, During functional activity, Single extremity supported Standing balance-Leahy Scale: Poor Standing balance comment: relies on at least 1 UE support during ADLS                            Cognition Arousal: Alert Behavior During Therapy: WFL for tasks assessed/performed Overall Cognitive Status: Within Functional Limits for tasks assessed                                          Exercises      General Comments General comments (skin integrity, edema, etc.): VSS      Pertinent Vitals/Pain Pain Assessment Pain Assessment: Faces Faces Pain Scale: Hurts a little bit Pain Location: Residual  limb Pain Descriptors / Indicators: Discomfort, Grimacing, Sharp Pain Intervention(s): Monitored during session    Home Living                          Prior Function            PT Goals (current goals can now be found in the care plan section) Acute Rehab PT Goals Patient Stated Goal: to get better Progress towards PT goals: Progressing toward goals    Frequency    Min 1X/week      PT Plan      Co-evaluation              AM-PAC PT "6 Clicks" Mobility   Outcome Measure  Help needed turning from your back to  your side while in a flat bed without using bedrails?: A Little Help needed moving from lying on your back to sitting on the side of a flat bed without using bedrails?: A Little Help needed moving to and from a bed to a chair (including a wheelchair)?: A Little Help needed standing up from a chair using your arms (e.g., wheelchair or bedside chair)?: A Little Help needed to walk in hospital room?: A Lot Help needed climbing 3-5 steps with a railing? : Total 6 Click Score: 15    End of Session Equipment Utilized During Treatment: Gait belt Activity Tolerance: Patient tolerated treatment well Patient left: with call bell/phone within reach;with nursing/sitter in room;in chair Nurse Communication: Mobility status PT Visit Diagnosis: Other abnormalities of gait and mobility (R26.89)     Time: 8119-1478 PT Time Calculation (min) (ACUTE ONLY): 20 min  Charges:    $Gait Training: 8-22 mins PT General Charges $$ ACUTE PT VISIT: 1 Visit                     Tammy [name], PT, DPT Acute Rehab Services [phone]    Tammy [name] 11/13/2022, 12:38 PM

## 2022-11-13 NOTE — Care Plan (Signed)
Patient transferred to [facility]. Report given to [facility], Charity fundraiser. All personal belongings taken with patient.

## 2022-11-13 NOTE — Discharge Summary (Signed)
.  mvd

## 2022-11-13 NOTE — Plan of Care (Signed)

## 2022-11-13 NOTE — Discharge Summary (Signed)
Discharge Diagnoses:  Principal Problem:   S/P AKA (above knee amputation) unilateral, left (HCC) Active Problems:   Infection of total knee replacement (HCC)   Surgeries: Procedure(s): HARDWARE REMOVAL INFECTED LEFT TOTAL KNEE ARTHROPLASTY LEFT ABOVE KNEE AMPUTATION on 11/04/2022    Consultants:   Discharged Condition: Improved  Hospital Course: Tammy [name] is an 67 y.o. female who was admitted 11/04/2022 with a chief complaint of No chief complaint on file. 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  They were brought to the operating room on 11/04/2022 and underwent Procedure(s): HARDWARE REMOVAL INFECTED LEFT TOTAL KNEE ARTHROPLASTY LEFT ABOVE KNEE AMPUTATION.    They were given perioperative antibiotics:  Anti-infectives (From admission, onward)    Start     Dose/Rate Route Frequency Ordered Stop   11/13/22 0000  cefTRIAXone (ROCEPHIN) IVPB        2 g Intravenous Every 24 hours 11/13/22 1100 12/16/22 2359   11/13/22 0000  vancomycin IVPB        750 mg Intravenous Every 12 hours 11/13/22 1100 12/16/22 2359   11/10/22 1000  vancomycin (VANCOREADY) IVPB 750 mg/150 mL        750 mg 150 mL/hr over 60 Minutes Intravenous Every 12 hours 11/09/22 2152     11/05/22 1830  vancomycin (VANCOREADY) IVPB 1250 mg/250 mL  Status:  Discontinued        1,250 mg 166.7 mL/hr over 90 Minutes Intravenous Every 24 hours 11/04/22 1720 11/09/22 2152   11/04/22 1830  vancomycin (VANCOREADY) IVPB 1500 mg/300 mL        1,500 mg 150 mL/hr over 120 Minutes Intravenous  Once 11/04/22 1720 11/04/22 2044   11/04/22 1700  cefTRIAXone (ROCEPHIN) 2 g in sodium chloride 0.9 % 100 mL IVPB        2 g 200 mL/hr over 30 Minutes Intravenous Every 24 hours 11/04/22 1612     11/04/22 1500  ceFAZolin (ANCEF) IVPB 2g/100 mL premix  Status:  Discontinued        2 g 200 mL/hr over 30 Minutes Intravenous Every 8 hours 11/04/22 1400 11/04/22 1612    11/04/22 1022  vancomycin (VANCOCIN) powder  Status:  Discontinued          As needed 11/04/22 1022 11/04/22 1128   11/04/22 0825  ceFAZolin (ANCEF) IVPB 2g/100 mL premix        2 g 200 mL/hr over 30 Minutes Intravenous On call to O.R. 11/04/22 1914 11/04/22 1024     .  They were given sequential compression devices, early ambulation, and Other (comment) for DVT prophylaxis.  Recent vital signs: Patient Vitals for the past 24 hrs:  BP Temp Temp src Pulse Resp SpO2  11/13/22 0757 137/65 97.8 F (36.6 C) -- -- 18 100 %  11/13/22 0445 (!) 173/80 98.6 F (37 C) Oral 69 18 97 %  11/12/22 2026 (!) 142/72 98.4 F (36.9 C) Oral 66 18 98 %  11/12/22 1440 (!) 149/66 98 F (36.7 C) -- 74 18 99 %  .  Recent laboratory studies: No results found.  Discharge Medications:   Allergies as of 11/13/2022       Reactions   Lisinopril Cough        Medication List     STOP taking these medications    amoxicillin 500 MG capsule Commonly known as: AMOXIL   HYDROcodone-acetaminophen 10-325 MG tablet Commonly known as: NORCO       TAKE these  medications    acetaminophen 500 MG tablet Commonly known as: TYLENOL Take 2 tablets (1,000 mg total) by mouth every 8 (eight) hours as needed for moderate pain, mild pain, fever or headache.   amLODipine 10 MG tablet Commonly known as: NORVASC Take 1 tablet (10 mg total) by mouth daily.   anastrozole 1 MG tablet Commonly known as: ARIMIDEX TAKE 1 TABLET BY MOUTH DAILY   BENGAY EX Apply 1 Application topically daily as needed (pain).   buPROPion 150 MG 12 hr tablet Commonly known as: WELLBUTRIN SR Take 150 mg by mouth 2 (two) times daily.   calcium carbonate 1250 (500 Ca) MG tablet Commonly known as: OS-CAL - dosed in mg of elemental calcium Take 1 tablet by mouth daily.   cefTRIAXone IVPB Commonly known as: ROCEPHIN Inject 2 g into the vein daily. Indication:  osteomyelitis s/p AKA w/ retained hardware First Dose: No Last Day of  Therapy:  12/15/22 Labs - Once weekly:  CBC/D and BMP, Labs - Once weekly: ESR and CRP Method of administration: IV Push Method of administration may be changed at the discretion of home infusion pharmacist based upon assessment of the patient and/or caregiver's ability to self-administer the medication ordered.   gabapentin 100 MG capsule Commonly known as: Neurontin Take 1 capsule (100 mg total) by mouth at bedtime. For hot flushes   hydrALAZINE 25 MG tablet Commonly known as: APRESOLINE Take 1 tablet (25 mg total) by mouth 2 (two) times daily.   meloxicam 15 MG tablet Commonly known as: MOBIC Take 1 tablet (15 mg total) by mouth daily. What changed:  when to take this reasons to take this   oxyCODONE-acetaminophen 5-325 MG tablet Commonly known as: PERCOCET/ROXICET Take 1 tablet by mouth every 4 (four) hours as needed.   pantoprazole 40 MG tablet Commonly known as: PROTONIX Take 1 tablet (40 mg total) by mouth daily.   potassium chloride 10 MEQ tablet Commonly known as: KLOR-CON Take 10 mEq by mouth daily.   PRENATAL PO Take 1 tablet by mouth daily.   promethazine 12.5 MG tablet Commonly known as: PHENERGAN Take 1 tablet (12.5 mg total) by mouth every 8 (eight) hours as needed for nausea or vomiting.   vancomycin IVPB Inject 750 mg into the vein every 12 (twelve) hours. Indication:  osteomyelitis s/p AKA with retained hardware First Dose: No Last Day of Therapy:  12/15/22 Labs - "Sunday/Monday:  CBC/D, BMP, and vancomycin trough. Labs - Thursday:  BMP and vancomycin trough Labs - Once weekly: ESR and CRP Method of administration:Elastomeric Method of administration may be changed at the discretion of the patient and/or caregiver's ability to self-administer the medication ordered.   VITAMIN B-12 PO Take 2,000 mcg by mouth daily.   vitamin C 1000 MG tablet Take 1,000 mg by mouth daily.   zinc gluconate 50 MG tablet Take 50 mg by mouth daily.                Discharge Care Instructions  (From admission, onward)           Start     Ordered   11/13/22 0000  Change dressing on IV access line weekly and PRN  (Home infusion instructions - [facility] )        10" /04/24 1100            Diagnostic Studies: [place] EKG SITE RITE  Result Date: 11/05/2022 If Site Rite image not attached, placement could not be confirmed due to current cardiac  rhythm.  MM 3D DIAGNOSTIC MAMMOGRAM BILATERAL BREAST  Result Date: 10/15/2022 CLINICAL DATA:  Annual surveillance. Patient underwent a left lumpectomy for breast carcinoma in June 2022 with adjuvant radiation therapy. Patient is currently on anastrozole. She also reports an injury to the left breast approximally 5 years ago with a laceration of the medial breast. EXAM: DIGITAL DIAGNOSTIC BILATERAL MAMMOGRAM WITH TOMOSYNTHESIS AND CAD; ULTRASOUND LEFT BREAST LIMITED TECHNIQUE: Bilateral digital diagnostic mammography and breast tomosynthesis was performed. The images were evaluated with computer-aided detection. ; Targeted ultrasound examination of the left breast was performed. COMPARISON:  Previous exam(s). ACR Breast Density Category c: The breasts are heterogeneously dense, which may obscure small masses. FINDINGS: There are stable post lumpectomy changes in the posterior, upper outer left breast and left axilla. In the medial breast, there is a small mass with dystrophic appearing calcifications and associated distortion that has become more apparent since the previous year's exam. There are no other masses, no other areas of nonsurgical architectural distortion and no suspicious calcifications. On physical exam, a scar is seen in the left breast near 10 o'clock. Targeted ultrasound is performed, showing a small hypoechoic mass with associated calcifications just below the skin and just deep to the scar in the left breast at 10 o'clock, 10 cm from the nipple. Mass measures 7 x 4 x 6 mm, and is  consistent with an area of fat necrosis from the prior injury. IMPRESSION: 1. No evidence of new or recurrent breast malignancy. 2. Benign post lumpectomy changes on the left. 3. Benign area of fat necrosis deep to scar the left breast at 10 o'clock. RECOMMENDATION: 1.  Screening mammogram in one year.(Code:SM-B-01Y) I have discussed the findings and recommendations with the patient. If applicable, a reminder letter will be sent to the patient regarding the next appointment. BI-RADS CATEGORY  2: Benign. Electronically Signed   By: Amie Portland M.D.   On: 10/15/2022 11:19   [place] LIMITED ULTRASOUND INCLUDING AXILLA LEFT BREAST   Result Date: 10/15/2022 CLINICAL DATA:  Annual surveillance. Patient underwent a left lumpectomy for breast carcinoma in June 2022 with adjuvant radiation therapy. Patient is currently on anastrozole. She also reports an injury to the left breast approximally 5 years ago with a laceration of the medial breast. EXAM: DIGITAL DIAGNOSTIC BILATERAL MAMMOGRAM WITH TOMOSYNTHESIS AND CAD; ULTRASOUND LEFT BREAST LIMITED TECHNIQUE: Bilateral digital diagnostic mammography and breast tomosynthesis was performed. The images were evaluated with computer-aided detection. ; Targeted ultrasound examination of the left breast was performed. COMPARISON:  Previous exam(s). ACR Breast Density Category c: The breasts are heterogeneously dense, which may obscure small masses. FINDINGS: There are stable post lumpectomy changes in the posterior, upper outer left breast and left axilla. In the medial breast, there is a small mass with dystrophic appearing calcifications and associated distortion that has become more apparent since the previous year's exam. There are no other masses, no other areas of nonsurgical architectural distortion and no suspicious calcifications. On physical exam, a scar is seen in the left breast near 10 o'clock. Targeted ultrasound is performed, showing a small hypoechoic mass with  associated calcifications just below the skin and just deep to the scar in the left breast at 10 o'clock, 10 cm from the nipple. Mass measures 7 x 4 x 6 mm, and is consistent with an area of fat necrosis from the prior injury. IMPRESSION: 1. No evidence of new or recurrent breast malignancy. 2. Benign post lumpectomy changes on the left. 3. Benign area of fat  necrosis deep to scar the left breast at 10 o'clock. RECOMMENDATION: 1.  Screening mammogram in one year.(Code:SM-B-01Y) I have discussed the findings and recommendations with the patient. If applicable, a reminder letter will be sent to the patient regarding the next appointment. BI-RADS CATEGORY  2: Benign. Electronically Signed   By: Amie Portland M.D.   On: 10/15/2022 11:19    They benefited maximally from their hospital stay and there were no complications.     Disposition: Discharge disposition: 02-Transferred to [facility]      Discharge Instructions     [facility] pharmacist to adjust dose for Vancomycin, Aminoglycosides and other anti-infective therapies as requested by physician.   Complete by: As directed    [facility] to provide Cath Flo 2mg    Complete by: As directed    Administer for PICC line occlusion and as ordered by physician for other access device issues.   Anaphylaxis Kit: Provided to treat any anaphylactic reaction to the medication being provided to the patient if First Dose or when requested by physician   Complete by: As directed    Epinephrine 1mg /ml vial / amp: Administer 0.3mg  (0.51ml) subcutaneously once for moderate to severe anaphylaxis, nurse to call physician and pharmacy when reaction occurs and call 911 if needed for immediate care   Diphenhydramine 50mg /ml IV vial: Administer 25-50mg  IV/IM PRN for first dose reaction, rash, itching, mild reaction, nurse to call physician and pharmacy when reaction occurs   Sodium Chloride 0.9% NS IV: Administer if needed for  hypovolemic blood pressure drop or as ordered by physician after call to physician with anaphylactic reaction   Call MD / Call 911   Complete by: As directed    If you experience chest pain or shortness of breath, CALL 911 and be transported to the hospital emergency room.  If you develope a fever above 101 F, pus (white drainage) or increased drainage or redness at the wound, or calf pain, call your surgeon's office.   Change dressing on IV access line weekly and PRN   Complete by: As directed    Constipation Prevention   Complete by: As directed    Drink plenty of fluids.  Prune juice may be helpful.  You may use a stool softener, such as Colace (over the counter) 100 mg twice a day.  Use MiraLax (over the counter) for constipation as needed.   Diet - low sodium heart healthy   Complete by: As directed    Flush IV access with Sodium Chloride 0.9% and Heparin 10 units/ml or 100 units/ml   Complete by: As directed    Home infusion instructions - [facility]   Complete by: As directed    Instructions: Flush IV access with Sodium Chloride 0.9% and Heparin 10units/ml or 100units/ml   Change dressing on IV access line: Weekly and PRN   Instructions Cath Flo 2mg : Administer for PICC Line occlusion and as ordered by physician for other access device   [facility] pharmacist to adjust dose for: Vancomycin, Aminoglycosides and other anti-infective therapies as requested by physician   Increase activity slowly as tolerated   Complete by: As directed    Method of administration may be changed at the discretion of home infusion pharmacist based upon assessment of the patient and/or caregiver's ability to self-administer the medication ordered   Complete by: As directed    Post-operative opioid taper instructions:   Complete by: As directed    POST-OPERATIVE OPIOID  TAPER INSTRUCTIONS: It is important to wean off of your opioid medication as soon as possible. If you do not need  pain medication after your surgery it is ok to stop day one. Opioids include: Codeine, Hydrocodone(Norco, Vicodin), Oxycodone(Percocet, oxycontin) and hydromorphone amongst others.  Long term and even short term use of opiods can cause: Increased pain response Dependence Constipation Depression Respiratory depression And more.  Withdrawal symptoms can include Flu like symptoms Nausea, vomiting And more Techniques to manage these symptoms Hydrate well Eat regular healthy meals Stay active Use relaxation techniques(deep breathing, meditating, yoga) Do Not substitute Alcohol to help with tapering If you have been on opioids for less than two weeks and do not have pain than it is ok to stop all together.  Plan to wean off of opioids This plan should start within one week post op of your joint replacement. Maintain the same interval or time between taking each dose and first decrease the dose.  Cut the total daily intake of opioids by one tablet each day Next start to increase the time between doses. The last dose that should be eliminated is the evening dose.          Follow-up Information     Nadara Mustard, MD Follow up in 1 week(s).   Specialty: Orthopedic Surgery Contact information: [address] [place] [address] [phone]                To inpatient rehab  Signed: Adonis Huguenin 11/13/2022, 11:09 AM

## 2022-11-14 ENCOUNTER — Inpatient Hospital Stay (HOSPITAL_COMMUNITY): Payer: Medicare Other

## 2022-11-14 DIAGNOSIS — R609 Edema, unspecified: Secondary | ICD-10-CM

## 2022-11-14 DIAGNOSIS — G546 Phantom limb syndrome with pain: Secondary | ICD-10-CM | POA: Diagnosis not present

## 2022-11-14 DIAGNOSIS — Z89612 Acquired absence of left leg above knee: Secondary | ICD-10-CM | POA: Diagnosis not present

## 2022-11-14 DIAGNOSIS — M869 Osteomyelitis, unspecified: Secondary | ICD-10-CM | POA: Diagnosis not present

## 2022-11-14 DIAGNOSIS — D62 Acute posthemorrhagic anemia: Secondary | ICD-10-CM | POA: Diagnosis not present

## 2022-11-14 LAB — COMPREHENSIVE METABOLIC PANEL
ALT: 16 U/L (ref 0–44)
AST: 17 U/L (ref 15–41)
Albumin: 2.3 g/dL — ABNORMAL LOW (ref 3.5–5.0)
Alkaline Phosphatase: 91 U/L (ref 38–126)
Anion gap: 11 (ref 5–15)
BUN: 13 mg/dL (ref 8–23)
CO2: 27 mmol/L (ref 22–32)
Calcium: 8.7 mg/dL — ABNORMAL LOW (ref 8.9–10.3)
Chloride: 101 mmol/L (ref 98–111)
Creatinine, Ser: 0.52 mg/dL (ref 0.44–1.00)
GFR, Estimated: >60 mL/min (ref >60)
Glucose, Bld: 98 mg/dL (ref 70–99)
Potassium: 3.2 mmol/L — ABNORMAL LOW (ref 3.5–5.1)
Sodium: 139 mmol/L (ref 135–145)
Total Bilirubin: 0.3 mg/dL (ref 0.3–1.2)
Total Protein: 5.8 g/dL — ABNORMAL LOW (ref 6.5–8.1)

## 2022-11-14 LAB — CBC WITH DIFFERENTIAL/PLATELET
Abs Immature Granulocytes: 0.01 10*3/uL (ref 0.00–0.07)
Basophils Absolute: 0 10*3/uL (ref 0.0–0.1)
Basophils Relative: 1 %
Eosinophils Absolute: 0.2 10*3/uL (ref 0.0–0.5)
Eosinophils Relative: 3 %
HCT: 24.2 % — ABNORMAL LOW (ref 36.0–46.0)
Hemoglobin: 7.6 g/dL — ABNORMAL LOW (ref 12.0–15.0)
Immature Granulocytes: 0 %
Lymphocytes Relative: 17 %
Lymphs Abs: 1 10*3/uL (ref 0.7–4.0)
MCH: 27 pg (ref 26.0–34.0)
MCHC: 31.4 g/dL (ref 30.0–36.0)
MCV: 86.1 fL (ref 80.0–100.0)
Monocytes Absolute: 0.5 10*3/uL (ref 0.1–1.0)
Monocytes Relative: 9 %
Neutro Abs: 3.8 10*3/uL (ref 1.7–7.7)
Neutrophils Relative %: 70 %
Platelets: 340 10*3/uL (ref 150–400)
RBC: 2.81 MIL/uL — ABNORMAL LOW (ref 3.87–5.11)
RDW: 15 % (ref 11.5–15.5)
WBC: 5.5 10*3/uL (ref 4.0–10.5)
nRBC: 0 % (ref 0.0–0.2)

## 2022-11-14 MED ORDER — SODIUM CHLORIDE 0.9 % IV SOLN: INTRAVENOUS | Status: DC | PRN

## 2022-11-14 MED ORDER — FERROUS SULFATE 325 (65 FE) MG PO TABS
325.0000 mg | ORAL_TABLET | Freq: Every day | ORAL | Status: DC
  Administered 2022-11-14 – 2022-11-23 (×10): 325 mg via ORAL
  Filled 2022-11-14 (×10): qty 1

## 2022-11-14 MED ORDER — ENOXAPARIN SODIUM 80 MG/0.8ML IJ SOSY
70.0000 mg | PREFILLED_SYRINGE | Freq: Two times a day (BID) | INTRAMUSCULAR | Status: DC
  Administered 2022-11-14 – 2022-11-23 (×18): 70 mg via SUBCUTANEOUS
  Filled 2022-11-14 (×18): qty 0.8

## 2022-11-14 MED ORDER — POTASSIUM CHLORIDE CRYS ER 20 MEQ PO TBCR
20.0000 meq | EXTENDED_RELEASE_TABLET | Freq: Every day | ORAL | Status: DC
  Administered 2022-11-14 – 2022-11-16 (×3): 20 meq via ORAL
  Filled 2022-11-14 (×3): qty 1

## 2022-11-14 MED ORDER — SODIUM CHLORIDE 0.9% FLUSH: 10.0000 mL | INTRAVENOUS | Status: DC | PRN

## 2022-11-14 MED ORDER — SODIUM CHLORIDE 0.9% FLUSH
10.0000 mL | Freq: Two times a day (BID) | INTRAVENOUS | Status: DC
  Administered 2022-11-14 – 2022-11-23 (×11): 10 mL

## 2022-11-14 MED ORDER — CHLORHEXIDINE GLUCONATE CLOTH 2 % EX PADS
6.0000 | MEDICATED_PAD | Freq: Two times a day (BID) | CUTANEOUS | Status: DC
  Administered 2022-11-14 – 2022-11-23 (×19): 6 via TOPICAL

## 2022-11-14 NOTE — Plan of Care (Signed)
  Problem: RH Balance Goal: LTG: Patient will maintain dynamic sitting balance (OT) Description: LTG:  Patient will maintain dynamic sitting balance with assistance during activities of daily living (OT) Flowsheets (Taken 11/14/2022 1234) LTG: Pt will maintain dynamic sitting balance during ADLs with: Independent Goal: LTG Patient will maintain dynamic standing with ADLs (OT) Description: LTG:  Patient will maintain dynamic standing balance with assist during activities of daily living (OT)  Flowsheets (Taken 11/14/2022 1234) LTG: Pt will maintain dynamic standing balance during ADLs with: Independent with assistive device   Problem: Sit to Stand Goal: LTG:  Patient will perform sit to stand in prep for activites of daily living with assistance level (OT) Description: LTG:  Patient will perform sit to stand in prep for activites of daily living with assistance level (OT) Flowsheets (Taken 11/14/2022 1234) LTG: PT will perform sit to stand in prep for activites of daily living with assistance level: Independent with assistive device   Problem: RH Grooming Goal: LTG Patient will perform grooming w/assist,cues/equip (OT) Description: LTG: Patient will perform grooming with assist, with/without cues using equipment (OT) Flowsheets (Taken 11/14/2022 1234) LTG: Pt will perform grooming with assistance level of: Independent with assistive device    Problem: RH Bathing Goal: LTG Patient will bathe all body parts with assist levels (OT) Description: LTG: Patient will bathe all body parts with assist levels (OT) Flowsheets (Taken 11/14/2022 1234) LTG: Pt will perform bathing with assistance level/cueing: Independent with assistive device    Problem: RH Dressing Goal: LTG Patient will perform upper body dressing (OT) Description: LTG Patient will perform upper body dressing with assist, with/without cues (OT). Flowsheets (Taken 11/14/2022 1234) LTG: Pt will perform upper body dressing with assistance  level of: Independent Goal: LTG Patient will perform lower body dressing w/assist (OT) Description: LTG: Patient will perform lower body dressing with assist, with/without cues in positioning using equipment (OT) Flowsheets (Taken 11/14/2022 1234) LTG: Pt will perform lower body dressing with assistance level of: Independent with assistive device   Problem: RH Toileting Goal: LTG Patient will perform toileting task (3/3 steps) with assistance level (OT) Description: LTG: Patient will perform toileting task (3/3 steps) with assistance level (OT)  Flowsheets (Taken 11/14/2022 1234) LTG: Pt will perform toileting task (3/3 steps) with assistance level: Independent with assistive device   Problem: RH Simple Meal Prep Goal: LTG Patient will perform simple meal prep w/assist (OT) Description: LTG: Patient will perform simple meal prep with assistance, with/without cues (OT). Flowsheets (Taken 11/14/2022 1234) LTG: Pt will perform simple meal prep with assistance level of: Independent with assistive device   Problem: RH Laundry Goal: LTG Patient will perform laundry w/assist, cues (OT) Description: LTG: Patient will perform laundry with assistance, with/without cues (OT). Flowsheets (Taken 11/14/2022 1234) LTG: Pt will perform laundry with assistance level of: Independent with assistive device   Problem: RH Light Housekeeping Goal: LTG Patient will perform light housekeeping w/assist (OT) Description: LTG: Patient will perform light housekeeping with assistance, with/without cues (OT). Flowsheets (Taken 11/14/2022 1234) LTG: Pt will perform light housekeeping with assistance level of: Independent with assistive device

## 2022-11-14 NOTE — Progress Notes (Signed)
VASCULAR LAB    Bilateral lower extremity venous duplex has been performed.  See CV proc for preliminary results.   Jamari Diana, RVT 11/14/2022, 4:10 PM

## 2022-11-14 NOTE — Progress Notes (Signed)
Occupational Therapy Session Note  Patient Details  Name: Tammy [name] MRN: [id number] Date of Birth: Aug 28, 1955  {CHL IP REHAB OT TIME CALCULATIONS:304400400}  {CHL IP REHAB OT TIME CALCULATIONS:304400400}  Short Term Goals: Week 1:  OT Short Term Goal 1 (Week 1): Pt will stand up to 3 min for sinkside tasks with close S OT Short Term Goal 2 (Week 1): Transfer to TTB with dist S OT Short Term Goal 3 (Week 1): LB dressing with dist s  Skilled Therapeutic Interventions/Progress Updates:   Session 1: Pt received *** for skilled OT session with focus on ***. Pt agreeable to interventions, demonstrating overall *** mood. Pt reported ***/10 pain, stating "***" in reference to ***. OT offering intermediate rest breaks and positioning suggestions throughout session to address pain/fatigue and maximize participation/safety in session.    Pt remained *** with all immediate needs met at end of session. Pt continues to be appropriate for skilled OT intervention to promote further functional independence.   Session 2: Pt received *** for skilled OT session with focus on ***. Pt agreeable to interventions, demonstrating overall *** mood. Pt reported ***/10 pain, stating "***" in reference to ***. OT offering intermediate rest breaks and positioning suggestions throughout session to address pain/fatigue and maximize participation/safety in session.    Pt remained *** with all immediate needs met at end of session. Pt continues to be appropriate for skilled OT intervention to promote further functional independence.    Therapy Documentation Precautions:  Precautions Precautions: Fall Restrictions Weight Bearing Restrictions: Yes LLE Weight Bearing: Non weight bearing   Therapy/Group: Individual Therapy  Lou Cal, OTR/L, MSOT  11/14/2022, 8:01 PM

## 2022-11-14 NOTE — Evaluation (Signed)
Occupational Therapy Assessment and Plan  Patient Details  Name: Tammy [name] MRN: [id number] Date of Birth: [birth year]  OT Diagnosis: acute pain and muscle weakness (generalized) Rehab Potential: Rehab Potential (ACUTE ONLY): Good ELOS: 10-12 days   Today's Date: 11/14/2022 OT Individual Time: 6295-2841 OT Individual Time Calculation (min): 76 min     Hospital Problem: Principal Problem:   Left above-knee amputee [facility])   Past Medical History:  Past Medical History:  Diagnosis Date   Anemia    hx of   Anxiety    on meds   Arthritis    back/bilateral ankles   Bipolar disorder (HCC)    Breast cancer (HCC) 2022   LEFT breast-radiation   Depression    on meds   Family history of breast cancer 07/10/2020   Family history of colon cancer 07/10/2020   Family history of ovarian cancer 07/10/2020   GERD (gastroesophageal reflux disease)    on meds   Heart murmur    dx by PCP   Hepatitis C    HTN (hypertension)    on meds   Hx of radiation therapy    Liver cirrhosis (HCC)    from hep c   Neuromuscular disorder (HCC)    Osteopenia    Personal history of radiation therapy    Stroke (HCC) 09/2012   slurred speech and LEFT sided weakness/notes 10/04/2012   Past Surgical History:  Past Surgical History:  Procedure Laterality Date   AMPUTATION Left 11/04/2022   Procedure: LEFT ABOVE KNEE AMPUTATION;  Surgeon: Nadara Mustard, MD;  Location: [facility] OR;  Service: Orthopedics;  Laterality: Left;   ANKLE FRACTURE SURGERY Left 2018   APPLICATION OF WOUND VAC Left 06/15/2022   Procedure: APPLICATION OF WOUND VAC;  Surgeon: Peggye Form, DO;  Location: [facility] ORS;  Service: Plastics;  Laterality: Left;   APPLICATION OF WOUND VAC Left 09/14/2022   Procedure: APPLICATION OF WOUND VAC;  Surgeon: Peggye Form, DO;  Location: [facility] OR;  Service: Plastics;  Laterality: Left;   BREAST LUMPECTOMY Left 07/2020   BREAST LUMPECTOMY WITH RADIOACTIVE SEED AND SENTINEL LYMPH NODE  BIOPSY Left 07/26/2020   Procedure: LEFT BREAST LUMPECTOMY WITH RADIOACTIVE SEED AND SENTINEL LYMPH NODE BIOPSY;  Surgeon: Abigail Miyamoto, MD;  Location: [facility];  Service: General;  Laterality: Left;   CESAREAN SECTION  1986   COLONOSCOPY     DIAGNOSTIC LAPAROSCOPY  1985   HARDWARE REMOVAL Left 04/30/2022   Procedure: HARDWARE REMOVAL LEFT FEMUR;  Surgeon: Samson Frederic, MD;  Location: [facility] ORS;  Service: Orthopedics;  Laterality: Left;  150   HARDWARE REMOVAL Left 11/04/2022   Procedure: HARDWARE REMOVAL INFECTED LEFT TOTAL KNEE ARTHROPLASTY;  Surgeon: Nadara Mustard, MD;  Location: [facility] OR;  Service: Orthopedics;  Laterality: Left;   I & D KNEE WITH POLY EXCHANGE Left 06/07/2022   Procedure: IRRIGATION AND DEBRIDEMENT KNEE WITH POLY EXCHANGE;  Surgeon: Samson Frederic, MD;  Location: [facility] ORS;  Service: Orthopedics;  Laterality: Left;   INCISION AND DRAINAGE OF WOUND Left 06/04/2022   Procedure: IRRIGATION AND DEBRIDEMENT KNEE;  Surgeon: Samson Frederic, MD;  Location: [facility];  Service: Orthopedics;  Laterality: Left;  60   INCISION AND DRAINAGE OF WOUND Left 06/15/2022   Procedure: excision of knee wound with Myriad;  Surgeon: Peggye Form, DO;  Location: [facility];  Service: Plastics;  Laterality: Left;   ORIF FEMUR FRACTURE Left 04/10/2021   Procedure: OPEN REDUCTION INTERNAL FIXATION (ORIF) DISTAL FEMUR FRACTURE;  Surgeon: Myrene Galas, MD;  Location: [facility] OR;  Service: Orthopedics;  Laterality: Left;   PARTIAL KNEE ARTHROPLASTY Left 04/30/2022   Procedure: DISTAL FEMUR REPLACEMENT;  Surgeon: Samson Frederic, MD;  Location: [facility];  Service: Orthopedics;  Laterality: Left;  150    Assessment & Plan Clinical Impression:   Patient currently requires mod with basic self-care skills and IADL secondary to muscle weakness, decreased cardiorespiratoy endurance, and decreased sitting balance, decreased standing balance, decreased postural control, and decreased balance  strategies.  Prior to hospitalization, patient could complete BADL's and IADL's with mod I with AE/AD with modified independent .  Patient will benefit from skilled intervention to decrease level of assist with basic self-care skills, increase independence with basic self-care skills, and increase level of independence with iADL prior to discharge home with care partner.  Anticipate patient will require intermittent supervision and no further OT follow recommended until ready for prosthetic training.   OT - End of Session Activity Tolerance: Decreased this session Endurance Deficit: Yes Endurance Deficit Description: fatigued after sitting EOB OT Assessment Rehab Potential (ACUTE ONLY): Good OT Barriers to Discharge: Inaccessible home environment;Home environment access/layout;IV antibiotics;Wound Care OT Barriers to Discharge Comments: new L LE amputee with IV abx need, 5 ste OT Patient demonstrates impairments in the following area(s): Balance;Edema;Endurance;Pain;Motor;Skin Integrity OT Basic ADL's Functional Problem(s): Grooming;Bathing;Dressing;Toileting OT Advanced ADL's Functional Problem(s): Simple Meal Preparation;Laundry;Light Housekeeping OT Transfers Functional Problem(s): Toilet;Tub/Shower OT Additional Impairment(s): None OT Plan OT Intensity: Minimum of 1-2 x/day, 45 to 90 minutes OT Frequency: 5 out of 7 days OT Duration/Estimated Length of Stay: 10-12 days OT Treatment/Interventions: Metallurgist training;Community reintegration;Discharge planning;Disease mangement/prevention;DME/adaptive equipment instruction;Functional mobility training;Neuromuscular re-education;Pain management;Patient/family education;Psychosocial support;Self Care/advanced ADL retraining;Skin care/wound managment;Therapeutic Activities;Therapeutic Exercise;UE/LE Strength taining/ROM;Wheelchair propulsion/positioning OT Self Feeding Anticipated Outcome(s): indep OT Basic Self-Care Anticipated  Outcome(s): mod I OT Toileting Anticipated Outcome(s): mod I OT Bathroom Transfers Anticipated Outcome(s): mod I OT Recommendation Recommendations for Other Services: Neuropsych consult;Therapeutic Recreation consult Therapeutic Recreation Interventions: Stress management;Kitchen group;Outing/community reintergration Patient destination: Home Follow Up Recommendations: None Equipment Recommended: Wheelchair (measurements);Wheelchair cushion (measurements) Equipment Details: has all DME except w/c   OT Evaluation Precautions/Restrictions  Precautions Precautions: Fall Restrictions Weight Bearing Restrictions: Yes LLE Weight Bearing: Non weight bearing General Chart Reviewed: Yes Family/Caregiver Present: No Vital Signs Therapy Vitals Temp: 97.9 F (36.6 C) Temp Source: Oral Pulse Rate: 75 Resp: 17 BP: 128/79 Patient Position (if appropriate): Sitting Oxygen Therapy SpO2: 99 % O2 Device: Room Air Pain Pain Assessment Pain Scale: 0-10 Pain Score: 5  Faces Pain Scale: Hurts a little bit Pain Type: Surgical pain Pain Location: Leg Pain Orientation: Left Pain Descriptors / Indicators: Aching;Stabbing Pain Onset: On-going Patients Stated Pain Goal: 1 Pain Intervention(s): Pain med given for lower pain score than stated, per patient request;Rest;Distraction Multiple Pain Sites: No Home Living/Prior Functioning Home Living Family/patient expects to be discharged to:: Private residence Living Arrangements: Spouse/significant other Available Help at Discharge: Available 24 hours/day, Family, Friend(s) Type of Home: Mobile home Home Access: Stairs to enter Secretary/administrator of Steps: 5 Entrance Stairs-Rails: Right, Left Home Layout: One level Bathroom Shower/Tub: Engineer, manufacturing systems: Standard Bathroom Accessibility: Yes Additional Comments: building ramp  Lives With: Other (Comment) (fiance) IADL History Homemaking Responsibilities: Yes Current  License: Yes Mode of Transportation: Car Education: Charity fundraiser 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 Leisure and Hobbies: Research scientist (medical), flowers and housekeeping Prior Function Level of Independence: Needs assistance with homemaking, Requires assistive device for independence, Independent with basic ADLs  Able to Take Stairs?: No Driving: No Vocation: Retired Administrator, sports Baseline Vision/History: 0 No visual deficits Ability to See in Adequate Light: 0 Adequate Patient Visual Report: No change from baseline Vision Assessment?: No apparent visual deficits Perception  Perception: Within Functional Limits Praxis Praxis: WFL Cognition Cognition Overall Cognitive Status: Within Functional Limits for tasks assessed Arousal/Alertness: Awake/alert Memory: Appears intact Awareness: Appears intact Problem Solving: Appears intact Safety/Judgment: Appears intact Brief Interview for Mental Status (BIMS) Repetition of Three Words (First Attempt): 3 Temporal Orientation: Year: Correct Temporal Orientation: Month: Accurate within 5 days Temporal Orientation: Day: Correct Recall: "Sock": Yes, no cue required Recall: "Blue": Yes, no cue required Recall: "Bed": Yes, no cue required BIMS Summary Score: 15 Sensation Sensation Light Touch: Impaired Detail Light Touch Impaired Details: Impaired LLE Hot/Cold: Not tested Proprioception: Impaired by gross assessment Stereognosis: Not tested Additional Comments: pt reports phantom pain in L residual limb and hypersensitivity on L > R Coordination Gross Motor Movements are Fluid and Coordinated: No Fine Motor Movements are Fluid and Coordinated: No Coordination and Movement Description: altered balance strategies due to L AKA and pain Finger Nose Finger Test: WFL bilaterally but slowed on L side  9 HPT 24 sec R, 32 sec L side  Motor  Motor Motor: Abnormal postural alignment and control Motor - Skilled Clinical Observations: altered  balance strategies due to L AKA and pain  Trunk/Postural Assessment  Cervical Assessment Cervical Assessment: Within Functional Limits Thoracic Assessment Thoracic Assessment: Within Functional Limits Lumbar Assessment Lumbar Assessment: Within Functional Limits Postural Control Postural Control: Deficits on evaluation Righting Reactions: delayed on L Protective Responses: delayed on L  Balance Balance Balance Assessed: Yes Static Sitting Balance Static Sitting - Balance Support: Feet supported;Bilateral upper extremity supported Static Sitting - Level of Assistance: 6: Modified independent (Device/Increase time) Dynamic Sitting Balance Dynamic Sitting - Balance Support: No upper extremity supported;Feet supported Dynamic Sitting - Level of Assistance: 5: Stand by assistance Static Standing Balance Static Standing - Balance Support: Bilateral upper extremity supported;During functional activity Static Standing - Level of Assistance: 5: Stand by assistance Dynamic Standing Balance Dynamic Standing - Balance Support: Bilateral upper extremity supported;During functional activity Dynamic Standing - Level of Assistance: 4: Min assist Dynamic Standing - Comments: with transfers and gait Extremity/Trunk Assessment RUE Assessment RUE Assessment: Within Functional Limits LUE Assessment LUE Assessment: Within Functional Limits  Care Tool Care Tool Self Care Eating   Eating Assist Level: Set up assist    Oral Care    Oral Care Assist Level: Set up assist    Bathing   Body parts bathed by patient: Right arm;Left arm;Chest;Abdomen;Front perineal area;Buttocks;Right upper leg;Left upper leg;Right lower leg;Face   Body parts n/a: Left lower leg Assist Level: Minimal Assistance - Patient > 75%    Upper Body Dressing(including orthotics)   What is the patient wearing?: Hospital gown only   Assist Level: Set up assist    Lower Body Dressing (excluding footwear)   What is the  patient wearing?: Underwear/pull up;Pants Assist for lower body dressing: Minimal Assistance - Patient > 75%    Putting on/Taking off footwear     Assist for footwear: Minimal Assistance - Patient > 75%       Care Tool Toileting Toileting activity   Assist for toileting: Minimal Assistance - Patient > 75%     Care Tool Bed Mobility Roll left and right activity   Roll left and right assist level: Supervision/Verbal cueing    Sit to lying activity   Sit  to lying assist level: Supervision/Verbal cueing    Lying to sitting on side of bed activity   Lying to sitting on side of bed assist level: the ability to move from lying on the back to sitting on the side of the bed with no back support.: Supervision/Verbal cueing     Care Tool Transfers Sit to stand transfer   Sit to stand assist level: Minimal Assistance - Patient > 75%    Chair/bed transfer   Chair/bed transfer assist level: Minimal Assistance - Patient > 75%     Toilet transfer   Assist Level: Minimal Assistance - Patient > 75%     Care Tool Cognition  Expression of Ideas and Wants Expression of Ideas and Wants: 3. Some difficulty - exhibits some difficulty with expressing needs and ideas (e.g, some words or finishing thoughts) or speech is not clear  Understanding Verbal and Non-Verbal Content Understanding Verbal and Non-Verbal Content: 4. Understands (complex and basic) - clear comprehension without cues or repetitions   Memory/Recall Ability Memory/Recall Ability : Current season;That he or she is in a hospital/hospital unit   Refer to Care Plan for Long Term Goals  SHORT TERM GOAL WEEK 1 OT Short Term Goal 1 (Week 1): Pt will stand up to 3 min for sinkside tasks with close S OT Short Term Goal 2 (Week 1): Transfer to TTB with dist S OT Short Term Goal 3 (Week 1): LB dressing with dist s  Recommendations for other services: Neuropsych and Adult nurse group, Stress management, and  Outing/community reintegration   Skilled Therapeutic Intervention ADL ADL Eating: Set up Where Assessed-Eating: Wheelchair Grooming: Setup Where Assessed-Grooming: Sitting at sink Upper Body Bathing: Setup Where Assessed-Upper Body Bathing: Sitting at sink Lower Body Bathing: Minimal assistance Where Assessed-Lower Body Bathing: Standing at sink;Sitting at sink Upper Body Dressing: Setup Where Assessed-Upper Body Dressing: Sitting at sink Lower Body Dressing: Minimal assistance Where Assessed-Lower Body Dressing: Sitting at sink;Standing at sink Toileting: Minimal assistance Where Assessed-Toileting: Toilet;Bedside Commode Toilet Transfer: Minimal assistance Toilet Transfer Method: Stand pivot Toilet Transfer Equipment: Bedside commode;Grab bars Tub/Shower Transfer: Minimal assistance Tub/Shower Transfer Method: Stand pivot Tub/Shower Equipment: Insurance underwriter: Insurance underwriter Method: Runner, broadcasting/film/video ADL Comments: set up sinkside UB seated, standing for hygiene and LB mngt with min A, min A grab bar or RW transfer Mobility  Bed Mobility Bed Mobility: Rolling Right;Rolling Left;Supine to Sit Rolling Right: Supervision/verbal cueing Rolling Left: Supervision/Verbal cueing Supine to Sit: Supervision/Verbal cueing Transfers Sit to Stand: Minimal Assistance - Patient > 75% Stand to Sit: Contact Guard/Touching assist  OT Treatment/Intervention: Pt seen for full initial OT evaluation and training session this am. Pt in bed upon OT arrival. OT introduced role of therapy and purpose of session. Pt  open to all presented assessment and training this visit. OT assisted and assessed ADL's, mobility, vision, sensation. cognition/lang, G/FMC, strength and balance throughout session. See above for levels. Pt will benefit from skilled OT services at [facility] to maximize function and safety with  recommendation to return home with mod I for BADL's and family support from fiance. Pt most likely will not require OT until outpt for prosthetic training integration.  Pt left at end of session in w/c with chair alarm set, tray table and nurse call bell within reach.   Discharge Criteria: Patient will be discharged from OT if patient refuses treatment 3 consecutive times without medical reason, if treatment  goals not met, if there is a change in medical status, if patient makes no progress towards goals or if patient is discharged from hospital.  The above assessment, treatment plan, treatment alternatives and goals were discussed and mutually agreed upon: by patient  Vicenta Dunning 11/14/2022, 1:02 PM

## 2022-11-14 NOTE — Progress Notes (Addendum)
PROGRESS NOTE   Subjective/Complaints: She had a pretty good night. Phantom pain better in left leg with increased gabapentin!   ROS: Patient denies fever, rash, sore throat, blurred vision, dizziness, nausea, vomiting, diarrhea, cough, shortness of breath or chest pain,   headache, or mood change.    Objective:   No results found. Recent Labs    11/14/22 0413  WBC 5.5  HGB 7.6*  HCT 24.2*  PLT 340   Recent Labs    11/14/22 0413  NA 139  K 3.2*  CL 101  CO2 27  GLUCOSE 98  BUN 13  CREATININE 0.52  CALCIUM 8.7*    Intake/Output Summary (Last 24 hours) at 11/14/2022 1418 Last data filed at 11/14/2022 1240 Gross per 24 hour  Intake 1106 ml  Output 750 ml  Net 356 ml        Physical Exam: Vital Signs Blood pressure 128/79, pulse 75, temperature 97.9 F (36.6 C), temperature source Oral, resp. rate 17, height 5\' 2"  (1.575 m), weight 66.7 kg, SpO2 99%.  General: Alert and oriented x 3, No apparent distress HEENT: Head is normocephalic, atraumatic, PERRLA, EOMI, sclera anicteric, oral mucosa pink and moist, dentition intact, ext ear canals clear,  Neck: Supple without JVD or lymphadenopathy Heart: Reg rate and rhythm. No murmurs rubs or gallops Chest: CTA bilaterally without wheezes, rales, or rhonchi; no distress Abdomen: Soft, non-tender, non-distended, bowel sounds positive. Extremities: No clubbing, cyanosis, or edema. Pulses are 2+ Psych: Pt's affect is appropriate. Pt is cooperative Skin: left AK incision clean with minimal drainage. Foam dressing over anterior abrasions on thigh. Neuro:  Alert and oriented x 3. Normal insight and awareness. Intact Memory. Normal language and speech. Cranial nerve exam unremarkable. MMT: UE 5/5. RLE 5/5. LLE limited by pain and 2-3/5 HF. Sensory exam normal for light touch and pain in all 4 limbs. No limb ataxia or cerebellar signs. No abnormal tone appreciated.  .    Musculoskeletal: left thigh swollen and tender    Assessment/Plan: 1. Functional deficits which require 3+ hours per day of interdisciplinary therapy in a comprehensive inpatient rehab setting. Physiatrist is providing close team supervision and 24 hour management of active medical problems listed below. Physiatrist and rehab team continue to assess barriers to discharge/monitor patient progress toward functional and medical goals  Care Tool:  Bathing    Body parts bathed by patient: Right arm, Left arm, Chest, Abdomen, Front perineal area, Buttocks, Right upper leg, Left upper leg, Right lower leg, Face     Body parts n/a: Left lower leg   Bathing assist Assist Level: Minimal Assistance - Patient > 75%     Upper Body Dressing/Undressing Upper body dressing   What is the patient wearing?: Hospital gown only    Upper body assist Assist Level: Set up assist    Lower Body Dressing/Undressing Lower body dressing      What is the patient wearing?: Underwear/pull up, Pants     Lower body assist Assist for lower body dressing: Minimal Assistance - Patient > 75%     Toileting Toileting    Toileting assist Assist for toileting: Minimal Assistance - Patient > 75%  Transfers Chair/bed transfer  Transfers assist     Chair/bed transfer assist level: Minimal Assistance - Patient > 75%     Locomotion Ambulation   Ambulation assist      Assist level: Minimal Assistance - Patient > 75% (+2 for equipment) Assistive device: Walker-rolling Max distance: 83ft   Walk 10 feet activity   Assist     Assist level: Minimal Assistance - Patient > 75% Assistive device: Walker-rolling   Walk 50 feet activity   Assist Walk 50 feet with 2 turns activity did not occur: Safety/medical concerns (fatigue)         Walk 150 feet activity   Assist Walk 150 feet activity did not occur: Safety/medical concerns (fatigue)         Walk 10 feet on uneven surface   activity   Assist Walk 10 feet on uneven surfaces activity did not occur: Safety/medical concerns (fatigue)         Wheelchair     Assist Is the patient using a wheelchair?: Yes Type of Wheelchair: Manual    Wheelchair assist level: Supervision/Verbal cueing Max wheelchair distance: 139ft    Wheelchair 50 feet with 2 turns activity    Assist        Assist Level: Supervision/Verbal cueing   Wheelchair 150 feet activity     Assist      Assist Level: Supervision/Verbal cueing   Blood pressure 128/79, pulse 75, temperature 97.9 F (36.6 C), temperature source Oral, resp. rate 17, height 5\' 2"  (1.575 m), weight 66.7 kg, SpO2 99%.  Medical Problem List and Plan: 1. 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  Antithrombotics: -DVT/anticoagulation:  Pharmaceutical: Lovenox>>start -no pharmacologic DVT prophy on acute: check BLE venous duplex             -antiplatelet therapy: none   3. Pain Management: Tylenol, oxycodone as needed             -increased gabapentin to 300 mg q HS (for phantom pain) with good results             -continue massage/tactile feedback, vision/mirror therapy 4. Mood/Behavior/Sleep: LCSW to evaluate and provide emotional support             -history of bipolar: continue Wellbutrin SR 150 mg BID             -antipsychotic agents: n/a   5. Neuropsych/cognition: This patient is capable of making decisions on her own behalf.             -pt appears very positive and motivated to regain functional mobility 6. Skin/Wound Care: Routine skin care checks             -local dressing to incision, change daily prn.              -foam dressing to abrasions on anterior thigh  -shrinker sock left AKA 7. 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 Renold Don    12: History of breast cancer on Arimadex 1 mg daily    14: Anemia: acute blood loss atop chronic             -follow-up CBC with significant drop to 7.6 today  -iron supplement  -check stool for ob  -recheck hgb in AM   15: GERD: continue PPI      LOS: 1 days A FACE TO FACE EVALUATION WAS PERFORMED  Ranelle Oyster 11/14/2022, 2:18 PM

## 2022-11-14 NOTE — Progress Notes (Signed)
Inpatient Rehabilitation Admission Medication Review by a Pharmacist  A complete drug regimen review was completed for this patient to identify any potential clinically significant medication issues.  High Risk Drug Classes Is patient taking? Indication by Medication  Antipsychotic No   Anticoagulant Yes Lovenox: VTE prophylaxis  Antibiotic Yes Rocephin, vancomycin: osteomyelitis  Opioid Yes Oxycodone: pain  Antiplatelet No   Hypoglycemics/insulin No   Vasoactive Medication Yes Amlodipine, hydralazine: hypertension  Chemotherapy Yes, Oral Chemotherapy Anastrozole: hx of breast cancer  Other Yes vitC, Zinc sulfate: vitamins/supplements Wellbutrin: mood Gabapentin: pain Melatonin: sleep Protonix: reflux Senokot, sorbitol, fleet, Miralax: constipation Tylenol: pain Mylanta: indigestion Benadryl: itching Robitussin: cough Robaxin: muscle spasm Zofran: nausea/vomiting     Type of Medication Issue Identified Description of Issue Recommendation(s)  Drug Interaction(s) (clinically significant)     Duplicate Therapy     Allergy     No Medication Administration End Date     Incorrect Dose     Additional Drug Therapy Needed     Significant med changes from prior encounter (inform family/care partners about these prior to discharge).  Restart or discontinue as appropriate. Communicate medication changes with patient/family at discharge  Other       Clinically significant medication issues were identified that warrant physician communication and completion of prescribed/recommended actions by midnight of the next day:  No   Time spent performing this drug regimen review (minutes): 30   Thank you for allowing pharmacy to be a part of this patient's care.   Signe Colt, PharmD 11/14/2022 9:33 AM  **Pharmacist phone directory can be found on [email or web address] listed under [facility]**

## 2022-11-14 NOTE — Progress Notes (Signed)
Slept good. PRN Oxy ir 15mg 's given at 2006 and 0121. Rating pain "8", "sharp", "shooting" sensation. Ace wrap in place to left AKA. Foam dressing to left thigh. Using female urinal. LBM 10/02.Alfredo Martinez A

## 2022-11-14 NOTE — Plan of Care (Signed)
  Problem: Consults Goal: RH LIMB LOSS PATIENT EDUCATION Description: Description: See Patient Education module for eduction specifics. Outcome: Progressing   Problem: RH BOWEL ELIMINATION Goal: RH STG MANAGE BOWEL WITH ASSISTANCE Description: STG Manage Bowel with toileting Assistance. Outcome: Progressing Goal: RH STG MANAGE BOWEL W/MEDICATION W/ASSISTANCE Description: STG Manage Bowel with Medication with mod I  Assistance. Outcome: Progressing   Problem: RH SKIN INTEGRITY Goal: RH STG SKIN FREE OF INFECTION/BREAKDOWN Description: Manage w min assist Outcome: Progressing Goal: RH STG ABLE TO PERFORM INCISION/WOUND CARE W/ASSISTANCE Description: STG Able To Perform Incision/Wound Care With min Assistance. Outcome: Progressing   Problem: RH SAFETY Goal: RH STG ADHERE TO SAFETY PRECAUTIONS W/ASSISTANCE/DEVICE Description: STG Adhere to Safety Precautions With cues Assistance/Device. Outcome: Progressing   Problem: RH PAIN MANAGEMENT Goal: RH STG PAIN MANAGED AT OR BELOW PT'S PAIN GOAL Description: < 4 with prns Outcome: Progressing   Problem: RH KNOWLEDGE DEFICIT LIMB LOSS Goal: RH STG INCREASE KNOWLEDGE OF SELF CARE AFTER LIMB LOSS Description: Patient and S.O. will be able to manage care at discharge using educational resources independently Outcome: Progressing   Problem: Education: Goal: Knowledge of the prescribed therapeutic regimen will improve Outcome: Progressing Goal: Ability to verbalize activity precautions or restrictions will improve Outcome: Progressing Goal: Understanding of discharge needs will improve Outcome: Progressing   Problem: Activity: Goal: Ability to perform//tolerate increased activity and mobilize with assistive devices will improve Outcome: Progressing   Problem: Clinical Measurements: Goal: Postoperative complications will be avoided or minimized Outcome: Progressing   Problem: Self-Care: Goal: Ability to meet self-care needs will  improve Outcome: Progressing   Problem: Self-Concept: Goal: Ability to maintain and perform role responsibilities to the fullest extent possible will improve Outcome: Progressing   Problem: Pain Management: Goal: Pain level will decrease with appropriate interventions Outcome: Progressing   Problem: Skin Integrity: Goal: Demonstration of wound healing without infection will improve Outcome: Progressing   Problem: Education: Goal: Knowledge of the prescribed therapeutic regimen will improve Outcome: Progressing Goal: Individualized Educational Video(s) Outcome: Progressing   Problem: Activity: Goal: Ability to avoid complications of mobility impairment will improve Outcome: Progressing Goal: Range of joint motion will improve Outcome: Progressing   Problem: Clinical Measurements: Goal: Postoperative complications will be avoided or minimized Outcome: Progressing   Problem: Pain Management: Goal: Pain level will decrease with appropriate interventions Outcome: Progressing   Problem: Skin Integrity: Goal: Will show signs of wound healing Outcome: Progressing

## 2022-11-14 NOTE — Evaluation (Signed)
Physical Therapy Assessment and Plan  Patient Details  Name: Tammy [name] MRN: [id number] Date of Birth: [birth year]  PT Diagnosis: Abnormal posture, Abnormality of gait, Coordination disorder, Difficulty walking, Edema, Impaired sensation, Muscle weakness, and Pain in L residual limb Rehab Potential: Good ELOS: 10-12 days   Today's Date: 11/14/2022 PT Individual Time: 4098-1191 PT Individual Time Calculation (min): 71 min    Hospital Problem: Principal Problem:   Left above-knee amputee [facility])   Past Medical History:  Past Medical History:  Diagnosis Date   Anemia    hx of   Anxiety    on meds   Arthritis    back/bilateral ankles   Bipolar disorder (HCC)    Breast cancer (HCC) 2022   LEFT breast-radiation   Depression    on meds   Family history of breast cancer 07/10/2020   Family history of colon cancer 07/10/2020   Family history of ovarian cancer 07/10/2020   GERD (gastroesophageal reflux disease)    on meds   Heart murmur    dx by PCP   Hepatitis C    HTN (hypertension)    on meds   Hx of radiation therapy    Liver cirrhosis (HCC)    from hep c   Neuromuscular disorder (HCC)    Osteopenia    Personal history of radiation therapy    Stroke (HCC) 09/2012   slurred speech and LEFT sided weakness/notes 10/04/2012   Past Surgical History:  Past Surgical History:  Procedure Laterality Date   AMPUTATION Left 11/04/2022   Procedure: LEFT ABOVE KNEE AMPUTATION;  Surgeon: Nadara Mustard, MD;  Location: [facility] OR;  Service: Orthopedics;  Laterality: Left;   ANKLE FRACTURE SURGERY Left 2018   APPLICATION OF WOUND VAC Left 06/15/2022   Procedure: APPLICATION OF WOUND VAC;  Surgeon: Peggye Form, DO;  Location: [facility] ORS;  Service: Plastics;  Laterality: Left;   APPLICATION OF WOUND VAC Left 09/14/2022   Procedure: APPLICATION OF WOUND VAC;  Surgeon: Peggye Form, DO;  Location: [facility] OR;  Service: Plastics;  Laterality: Left;   BREAST LUMPECTOMY Left  07/2020   BREAST LUMPECTOMY WITH RADIOACTIVE SEED AND SENTINEL LYMPH NODE BIOPSY Left 07/26/2020   Procedure: LEFT BREAST LUMPECTOMY WITH RADIOACTIVE SEED AND SENTINEL LYMPH NODE BIOPSY;  Surgeon: Abigail Miyamoto, MD;  Location: [facility];  Service: General;  Laterality: Left;   CESAREAN SECTION  1986   COLONOSCOPY     DIAGNOSTIC LAPAROSCOPY  1985   HARDWARE REMOVAL Left 04/30/2022   Procedure: HARDWARE REMOVAL LEFT FEMUR;  Surgeon: Samson Frederic, MD;  Location: [facility] ORS;  Service: Orthopedics;  Laterality: Left;  150   HARDWARE REMOVAL Left 11/04/2022   Procedure: HARDWARE REMOVAL INFECTED LEFT TOTAL KNEE ARTHROPLASTY;  Surgeon: Nadara Mustard, MD;  Location: [facility] OR;  Service: Orthopedics;  Laterality: Left;   I & D KNEE WITH POLY EXCHANGE Left 06/07/2022   Procedure: IRRIGATION AND DEBRIDEMENT KNEE WITH POLY EXCHANGE;  Surgeon: Samson Frederic, MD;  Location: [facility];  Service: Orthopedics;  Laterality: Left;   INCISION AND DRAINAGE OF WOUND Left 06/04/2022   Procedure: IRRIGATION AND DEBRIDEMENT KNEE;  Surgeon: Samson Frederic, MD;  Location: [facility];  Service: Orthopedics;  Laterality: Left;  60   INCISION AND DRAINAGE OF WOUND Left 06/15/2022   Procedure: excision of knee wound with Myriad;  Surgeon: Peggye Form, DO;  Location: [facility];  Service: Plastics;  Laterality: Left;   ORIF FEMUR FRACTURE Left 04/10/2021   Procedure:  OPEN REDUCTION INTERNAL FIXATION (ORIF) DISTAL FEMUR FRACTURE;  Surgeon: Myrene Galas, MD;  Location: [facility] OR;  Service: Orthopedics;  Laterality: Left;   PARTIAL KNEE ARTHROPLASTY Left 04/30/2022   Procedure: DISTAL FEMUR REPLACEMENT;  Surgeon: Samson Frederic, MD;  Location: [facility];  Service: Orthopedics;  Laterality: Left;  150    Assessment & Plan Clinical Impression: Patient is a 67 y.o. year old female who underwent open reduction of her distal femur fracture April 10, 2021. Patient eventually had the hardware removed in March 2024 with  irrigation and debridement with Dr. [name] 3 separate times as well as distal femur replacement with total knee arthroplasty. Patient has undergone 2 wound debridements and application of myriad with Dr. Ulice Bold on May 6 and August 5. She continued to have purulent drainage in the wound vac; and the wound did not heal with significant pain in the infected area. Skin graft trial previously from plastic surgery failed as well. The hardware remained exposed. She underwent left AKA by Dr. [name] on 11/04/2022. Wound VAC removed 10/02. The amputation site wound cultures remain negative. She will continue on vancomycin and ceftriaxone through 11/05 and follow with ID. Tolerating diet. She continues to progress ambulation in the hallway with RW CGA/Min A. The patient requires inpatient medicine and rehabilitation evaluations and services for ongoing dysfunction secondary to left AKA.   Patient currently requires min with mobility secondary to muscle weakness, decreased cardiorespiratoy endurance, and decreased standing balance, decreased postural control, decreased balance strategies, and difficulty maintaining precautions.  Prior to hospitalization, patient was modified independent  with mobility and lived with Other (Comment) (fiance) in a Mobile home home.  Home access is 5Stairs to enter.  Patient will benefit from skilled PT intervention to maximize safe functional mobility, minimize fall risk, and decrease caregiver burden for planned discharge home with 24 hour supervision.  Anticipate patient will benefit from follow up OP at discharge.  PT - End of Session Activity Tolerance: Tolerates 30+ min activity with multiple rests Endurance Deficit: Yes Endurance Deficit Description: fatigued after sitting EOB PT Assessment Rehab Potential (ACUTE/IP ONLY): Good PT Barriers to Discharge: Inaccessible home environment;Home environment access/layout;Wound Care;Weight bearing restrictions;Other (comments) PT  Barriers to Discharge Comments: pain, 5 STE with 2 rails but can only reach 1 PT Patient demonstrates impairments in the following area(s): Balance;Edema;Endurance;Motor;Nutrition;Pain;Sensory;Skin Integrity PT Transfers Functional Problem(s): Bed Mobility;Bed to Chair;Car;Furniture PT Locomotion Functional Problem(s): Ambulation;Wheelchair Mobility;Stairs PT Plan PT Intensity: Minimum of 1-2 x/day ,45 to 90 minutes PT Frequency: 5 out of 7 days PT Duration Estimated Length of Stay: 10-12 days PT Treatment/Interventions: Ambulation/gait training;Discharge planning;Functional mobility training;Psychosocial support;Therapeutic Activities;Balance/vestibular training;Disease management/prevention;Neuromuscular re-education;Skin care/wound management;Therapeutic Exercise;Wheelchair propulsion/positioning;DME/adaptive equipment instruction;Pain management;Splinting/orthotics;UE/LE Strength taining/ROM;Community reintegration;Patient/family education;Stair training;UE/LE Coordination activities;Functional electrical stimulation PT Transfers Anticipated Outcome(s): Mod I with LRAD PT Locomotion Anticipated Outcome(s): Mod I with LRAD PT Recommendation Follow Up Recommendations: Outpatient PT Patient destination: Home Equipment Recommended: To be determined Equipment Details: has RW   PT Evaluation Precautions/Restrictions Precautions Precautions: Fall Restrictions Weight Bearing Restrictions: Yes LLE Weight Bearing: Non weight bearing Pain Interference Pain Interference Pain Effect on Sleep: 3. Frequently Pain Interference with Therapy Activities: 1. Rarely or not at all Pain Interference with Day-to-Day Activities: 1. Rarely or not at all Home Living/Prior Functioning Home Living Available Help at Discharge: Available 24 hours/day;Family;Friend(s) Type of Home: Mobile home Home Access: Stairs to enter Entrance Stairs-Number of Steps: 5 Entrance Stairs-Rails: Right;Left Home Layout: One  level Bathroom Shower/Tub: Engineer, manufacturing systems: Standard (bedside commode over  top) Bathroom Accessibility: Yes Additional Comments: building ramp  Lives With: Other (Comment) (fiance) Prior Function Level of Independence: Needs assistance with homemaking;Requires assistive device for independence (Finace supervised with showers)  Able to Take Stairs?: No (needed assist from [name] (finace)) Driving: No (not since last November) Vision/Perception  Vision - History Ability to See in Adequate Light: 0 Adequate Perception Perception: Within Functional Limits Praxis Praxis: WFL  Cognition Overall Cognitive Status: Within Functional Limits for tasks assessed Arousal/Alertness: Awake/alert Orientation Level: Oriented X4 Memory: Appears intact Awareness: Appears intact Problem Solving: Appears intact Safety/Judgment: Appears intact Sensation Sensation Light Touch: Impaired Detail Hot/Cold: Not tested Proprioception: Impaired by gross assessment Stereognosis: Not tested Additional Comments: pt reports phantom pain in L residual limb and hypersensitivity on L > R Coordination Gross Motor Movements are Fluid and Coordinated: No Fine Motor Movements are Fluid and Coordinated: No Coordination and Movement Description: altered balance strategies due to L AKA and pain Finger Nose Finger Test: [facility] bilaterally Heel Shin Test: unable to perform on LLE due to AKA Motor  Motor Motor: Abnormal postural alignment and control Motor - Skilled Clinical Observations: altered balance strategies due to L AKA and pain  Trunk/Postural Assessment  Cervical Assessment Cervical Assessment: Within Functional Limits Thoracic Assessment Thoracic Assessment: Within Functional Limits Lumbar Assessment Lumbar Assessment: Within Functional Limits Postural Control Postural Control: Deficits on evaluation Righting Reactions: delayed on L Protective Responses: delayed on L   Balance Balance Balance Assessed: Yes Static Sitting Balance Static Sitting - Balance Support: Feet supported;Bilateral upper extremity supported Static Sitting - Level of Assistance: 6: Modified independent (Device/Increase time) Dynamic Sitting Balance Dynamic Sitting - Balance Support: No upper extremity supported;Feet supported Dynamic Sitting - Level of Assistance: 5: Stand by assistance (supervision) Static Standing Balance Static Standing - Balance Support: Bilateral upper extremity supported;During functional activity (RW) Static Standing - Level of Assistance: 5: Stand by assistance (CGA) Dynamic Standing Balance Dynamic Standing - Balance Support: Bilateral upper extremity supported;During functional activity (RW) Dynamic Standing - Level of Assistance: 4: Min assist Dynamic Standing - Comments: with transfers and gait Extremity Assessment  RLE Assessment RLE Assessment: Exceptions to [facility] Strength Comments: tested sitting EOB RLE Strength Right Hip Flexion: 4-/5 Right Hip ABduction: 4-/5 Right Hip ADduction: 4-/5 Right Knee Flexion: 3+/5 Right Knee Extension: 4-/5 Right Ankle Dorsiflexion: 4-/5 Right Ankle Plantar Flexion: 4-/5 LLE Assessment LLE Assessment: Not tested General Strength Comments: not tested due to pain; grossly 3/5  Care Tool Care Tool Bed Mobility Roll left and right activity   Roll left and right assist level: Supervision/Verbal cueing    Sit to lying activity        Lying to sitting on side of bed activity   Lying to sitting on side of bed assist level: the ability to move from lying on the back to sitting on the side of the bed with no back support.: Supervision/Verbal cueing     Care Tool Transfers Sit to stand transfer   Sit to stand assist level: Minimal Assistance - Patient > 75%    Chair/bed transfer   Chair/bed transfer assist level: Minimal Assistance - Patient > 75%     Toilet transfer   Assist Level: Minimal  Assistance - Patient > 75%    Car transfer Car transfer activity did not occur: Safety/medical concerns (pain, fatigue)        Care Tool Locomotion Ambulation   Assist level: Minimal Assistance - Patient > 75% (+2 for equipment) Assistive device: Walker-rolling Max distance:  46ft  Walk 10 feet activity   Assist level: Minimal Assistance - Patient > 75% Assistive device: Walker-rolling   Walk 50 feet with 2 turns activity Walk 50 feet with 2 turns activity did not occur: Safety/medical concerns (fatigue)      Walk 150 feet activity Walk 150 feet activity did not occur: Safety/medical concerns (fatigue)      Walk 10 feet on uneven surfaces activity Walk 10 feet on uneven surfaces activity did not occur: Safety/medical concerns (fatigue)      Stairs Stair activity did not occur: Safety/medical concerns (fatigue)        Walk up/down 1 step activity Walk up/down 1 step or curb (drop down) activity did not occur: Safety/medical concerns (fatigue)      Walk up/down 4 steps activity Walk up/down 4 steps activity did not occur: Safety/medical concerns (fatigue)      Walk up/down 12 steps activity Walk up/down 12 steps activity did not occur: Safety/medical concerns (fatigue)      Pick up small objects from floor   Pick up small object from the floor assist level: Dependent - Patient 0%    Wheelchair Is the patient using a wheelchair?: Yes Type of Wheelchair: Manual   Wheelchair assist level: Supervision/Verbal cueing Max wheelchair distance: 129ft  Wheel 50 feet with 2 turns activity   Assist Level: Supervision/Verbal cueing  Wheel 150 feet activity   Assist Level: Supervision/Verbal cueing    Refer to Care Plan for Long Term Goals  SHORT TERM GOAL WEEK 1 PT Short Term Goal 1 (Week 1): pt will transfer bed<>chair with LRAD and supervision PT Short Term Goal 2 (Week 1): pt will transfer sit<>stand with LRAD and supervision PT Short Term Goal 3 (Week 1): pt will perform  car transfer with LRAD and CGA  Recommendations for other services: None   Skilled Therapeutic Intervention Evaluation completed (see details above and below) with education on PT POC and goals and individual treatment initiated with focus on functional mobility/transfers, generalized strengthening and endurance, dynamic standing balance/coordination, ambulation, and limb loss education. Received pt semi-reclined in bed with RN administering medications and hooking pt up to IV. Pt educated on PT evaluation, CIR policies, and therapy schedule and agreeable. Pt reported pain 5/10 in L residual limb, increasing to 8/10 with movement - pt initially declined pain medication, but then later requested it - RN notified.   Provided pt with 16x18 manual WC and donned underwear in supine with mod A (pt able to roll L/R with supervision). Reapplied ace wrap, although pt reports MD ordered shrinker. Donned pants in supine with max A via rolling. Pt transferred supine<>sitting L EOB from flat bed without bedrails with supervision and increased time/effort due to pain. Donned R non-skid sock with supervision and required mod A to scoot hips to EOB. Stood from EOB with RW and CGA and performed stand<>pivot bed<>WC with RW and CGA/min A. Pt then performed WC mobility 1108ft using BUE and supervision with emphasis on UE strength. Stood with RW and min A and ambulated 34ft with RW and min A with +2 for equipment management - limited by fatigue. Pt reports finace plans on building ramp but isn't sure how soon it will be done - therefore demonstrated stair navigation using shower chair technique (pt reports being familiar with this technique). Provided education on the following limb loss information: -purpose of shrinker -hip flexor contracture prevention -prosthetic fitting timeline -desensitization techniques -importance of avoiding unnecessary "hopping" to protect contralateral limb Returned to  room and pt transferred  WC<>toilet with bedside commode over top with min A. Pt able to manage clothing without assist. Concluded session with pt sitting on commode and NT notified to attend to care. Safety plan updated.      Mobility Bed Mobility Bed Mobility: Rolling Right;Rolling Left;Supine to Sit Rolling Right: Supervision/verbal cueing Rolling Left: Supervision/Verbal cueing Supine to Sit: Supervision/Verbal cueing Transfers Transfers: Sit to Stand;Stand to Sit;Stand Pivot Transfers Sit to Stand: Minimal Assistance - Patient > 75% Stand to Sit: Contact Guard/Touching assist Stand Pivot Transfers: Minimal Assistance - Patient > 75% Transfer (Assistive device): Rolling walker Locomotion  Gait Ambulation: Yes Gait Assistance: Minimal Assistance - Patient > 75% (+2 for equipment) Gait Distance (Feet): 12 Feet Assistive device: Rolling walker Gait Assistance Details: Verbal cues for technique Gait Assistance Details: verbal cues for soft landing on R heel Gait Gait: Yes Gait Pattern: Impaired ("hop to") Gait velocity: decreased Stairs / Additional Locomotion Stairs: No Wheelchair Mobility Wheelchair Mobility: Yes Wheelchair Assistance: Doctor, general practice: Both upper extremities Wheelchair Parts Management: Needs assistance Distance: 182ft   Discharge Criteria: Patient will be discharged from PT if patient refuses treatment 3 consecutive times without medical reason, if treatment goals not met, if there is a change in medical status, if patient makes no progress towards goals or if patient is discharged from hospital.  The above assessment, treatment plan, treatment alternatives and goals were discussed and mutually agreed upon: by patient  Huntley Dec PT, DPT 11/14/2022, 12:14 PM

## 2022-11-14 NOTE — Progress Notes (Signed)
Was notifed by vascular lab that dopplers are positive for DVT involving the femoral vein but not the deep or common femoral veins. Will begin treatment dose lovenox. Pharmacy consult placed. Continue bedrest through tomorrow.   Ranelle Oyster, MD, [facility] Physical Medicine & Rehabilitation Medical Director Rehabilitation Services 11/14/2022

## 2022-11-14 NOTE — Progress Notes (Signed)
PHARMACY - ANTICOAGULATION CONSULT NOTE  Pharmacy Consult for enoxaparin Indication: DVT  Allergies  Allergen Reactions   Lisinopril Cough    Patient Measurements: Height: 5\' 2"  (157.5 cm) Weight: 66.7 kg (147 lb 0.8 oz) IBW/kg (Calculated) : 50.1   Vital Signs: Temp: 97.9 F (36.6 C) (10/05 1239) Temp Source: Oral (10/05 1239) BP: 128/79 (10/05 1239) Pulse Rate: 75 (10/05 1239)  Labs: Recent Labs    11/14/22 0413  HGB 7.6*  HCT 24.2*  PLT 340  CREATININE 0.52    Estimated Creatinine Clearance: 60.2 mL/min (by C-G formula based on SCr of 0.52 mg/dL).   Medical History: Past Medical History:  Diagnosis Date   Anemia    hx of   Anxiety    on meds   Arthritis    back/bilateral ankles   Bipolar disorder (HCC)    Breast cancer (HCC) 2022   LEFT breast-radiation   Depression    on meds   Family history of breast cancer 07/10/2020   Family history of colon cancer 07/10/2020   Family history of ovarian cancer 07/10/2020   GERD (gastroesophageal reflux disease)    on meds   Heart murmur    dx by PCP   Hepatitis C    HTN (hypertension)    on meds   Hx of radiation therapy    Liver cirrhosis (HCC)    from hep c   Neuromuscular disorder (HCC)    Osteopenia    Personal history of radiation therapy    Stroke (HCC) 09/2012   slurred speech and LEFT sided weakness/notes 10/04/2012     Assessment: 67 yo W with osteomyelitis s/p L AKA 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 Pharmacy consulted for enoxaparin.   ClCr 60 ml/min. Last received prophylaxis enoxaparin 10/4 at 20:00.   Goal of Therapy:  Monitor platelets by anticoagulation protocol: Yes   Plan:  Enoxaparin 70 mg q12 hr  Monitor signs/symptoms of bleeding  Follow up transition to Apixaban   Alphia Moh, PharmD, BCPS, BCCP Clinical Pharmacist  Please check AMION for all [facility] Pharmacy phone numbers After 10:00 PM, call Main Pharmacy [phone]

## 2022-11-14 NOTE — Progress Notes (Signed)
Occupational Therapy Session Note  Patient Details  Name: Tammy [name] MRN: [id number] Date of Birth: 1955/05/30  Today's Date: 11/14/2022 OT Individual Time: 1345-1444 OT Individual Time Calculation (min): 59 min    Short Term Goals: Week 1:  OT Short Term Goal 1 (Week 1): Pt will stand up to 3 min for sinkside tasks with close S OT Short Term Goal 2 (Week 1): Transfer to TTB with dist S OT Short Term Goal 3 (Week 1): LB dressing with dist s  Skilled Therapeutic Interventions/Progress Updates:   Pt requesting toileting this session. Pt able to mobilize via w/c and perform SPT on and off commode over toilet with grab bar use for standing support with CGA. Min A fading to CGA for pants management and set up for peri hygiene. Transfer training to and from TTB with CGA and min cues. Pt able to self propel with only IV line support and cues to sink side. Stood with RW support up to 1 min sink side for hand washing. Pt organized belongings with OT via w/c level and left with all needs, nurse call button and chair alarm set.   Pain: 2/10 L residual limb with meds given prior to tx   Therapy Documentation Precautions:  Precautions Precautions: Fall Restrictions Weight Bearing Restrictions: Yes LLE Weight Bearing: Non weight bearing   Therapy/Group: Individual Therapy  Vicenta Dunning 11/14/2022, 2:19 PM

## 2022-11-14 NOTE — Progress Notes (Signed)
Orthopedic Tech Progress Note Patient Details:  Tammy [name] [birth year] [id number] X2 AKA shrinkers have been ordered from [facility].  Patient ID: Tammy [name], female   DOB: November 23, 1955, 67 y.o.   MRN: [id number]  Smitty Pluck 11/14/2022, 3:58 PM

## 2022-11-15 DIAGNOSIS — Z89612 Acquired absence of left leg above knee: Secondary | ICD-10-CM | POA: Diagnosis not present

## 2022-11-15 DIAGNOSIS — G546 Phantom limb syndrome with pain: Secondary | ICD-10-CM | POA: Diagnosis not present

## 2022-11-15 DIAGNOSIS — D62 Acute posthemorrhagic anemia: Secondary | ICD-10-CM | POA: Diagnosis not present

## 2022-11-15 DIAGNOSIS — M869 Osteomyelitis, unspecified: Secondary | ICD-10-CM | POA: Diagnosis not present

## 2022-11-15 LAB — HEMOGLOBIN AND HEMATOCRIT, BLOOD
HCT: 24.7 % — ABNORMAL LOW (ref 36.0–46.0)
Hemoglobin: 7.7 g/dL — ABNORMAL LOW (ref 12.0–15.0)

## 2022-11-15 NOTE — Progress Notes (Signed)
PROGRESS NOTE   Subjective/Complaints: Pt in bed. OT in room. We discussed her doppler findings. She is happy that it was found. Tolerating lovenox without issue  ROS: Patient denies fever, rash, sore throat, blurred vision, dizziness, nausea, vomiting, diarrhea, cough, shortness of breath or chest pain, joint or back/neck pain, headache, or mood change.     Objective:   VAS [place] LOWER EXTREMITY VENOUS (DVT)  Result Date: 11/14/2022  Lower Venous DVT Study Patient Name:  Tammy [name]  Date of Exam:   11/14/2022 Medical Rec #: [id number]                 Accession #:    [id number] Date of Birth: 1956/01/03                  Patient Gender: F Patient Age:   67 years Exam Location:  [facility] Procedure:      VAS [place] LOWER EXTREMITY VENOUS (DVT) Referring Phys: Wendi Maya --------------------------------------------------------------------------------  Indications: In rehabilitation status post left AKA.  Risk Factors: Immobility. Anticoagulation: Lovenox. Comparison Study: Prior negative left LEV done 07/14/22 Performing Technologist: Sherren Kerns RVS  Examination Guidelines: A complete evaluation includes B-mode imaging, spectral Doppler, color Doppler, and power Doppler as needed of all accessible portions of each vessel. Bilateral testing is considered an integral part of a complete examination. Limited examinations for reoccurring indications may be performed as noted. The reflux portion of the exam is performed with the patient in reverse Trendelenburg.  +---------+---------------+---------+-----------+----------+--------------+ RIGHT    CompressibilityPhasicitySpontaneityPropertiesThrombus Aging +---------+---------------+---------+-----------+----------+--------------+ CFV      Full           Yes      Yes                                 +---------+---------------+---------+-----------+----------+--------------+  SFJ      Full                                                        +---------+---------------+---------+-----------+----------+--------------+ FV Prox  Full                                                        +---------+---------------+---------+-----------+----------+--------------+ FV Mid   Full                                                        +---------+---------------+---------+-----------+----------+--------------+ FV DistalFull                                                        +---------+---------------+---------+-----------+----------+--------------+  PFV      Full                                                        +---------+---------------+---------+-----------+----------+--------------+ POP      Full           Yes      Yes                                 +---------+---------------+---------+-----------+----------+--------------+ PTV      Full                                                        +---------+---------------+---------+-----------+----------+--------------+ PERO     Full                                                        +---------+---------------+---------+-----------+----------+--------------+   +---------+---------------+---------+-----------+----------+--------------+ LEFT     CompressibilityPhasicitySpontaneityPropertiesThrombus Aging +---------+---------------+---------+-----------+----------+--------------+ CFV      Full           Yes      Yes                                 +---------+---------------+---------+-----------+----------+--------------+ SFJ      Full                                                        +---------+---------------+---------+-----------+----------+--------------+ FV Prox  Full                                                        +---------+---------------+---------+-----------+----------+--------------+ FV Mid   Full                                                         +---------+---------------+---------+-----------+----------+--------------+ FV DistalFull                                                        +---------+---------------+---------+-----------+----------+--------------+ PFV      Full                                                        +---------+---------------+---------+-----------+----------+--------------+  POP      Full           Yes      Yes                                 +---------+---------------+---------+-----------+----------+--------------+ PTV      Full                                                        +---------+---------------+---------+-----------+----------+--------------+ PERO     Full                                                        +---------+---------------+---------+-----------+----------+--------------+    Summary: BILATERAL: - No evidence of deep vein thrombosis seen in the lower extremities, bilaterally. -No evidence of popliteal cyst, bilaterally.   *See table(s) above for measurements and observations.    Preliminary    Recent Labs    11/14/22 0413 11/15/22 0516  WBC 5.5  --   HGB 7.6* 7.7*  HCT 24.2* 24.7*  PLT 340  --    Recent Labs    11/14/22 0413  NA 139  K 3.2*  CL 101  CO2 27  GLUCOSE 98  BUN 13  CREATININE 0.52  CALCIUM 8.7*    Intake/Output Summary (Last 24 hours) at 11/15/2022 1005 Last data filed at 11/15/2022 0720 Gross per 24 hour  Intake 1556.49 ml  Output 800 ml  Net 756.49 ml        Physical Exam: Vital Signs Blood pressure 139/65, pulse 60, temperature 98.1 F (36.7 C), resp. rate 20, height 5\' 2"  (1.575 m), weight 66.7 kg, SpO2 99%.  Constitutional: No distress . Vital signs reviewed. HEENT: NCAT, EOMI, oral membranes moist Neck: supple Cardiovascular: RRR without murmur. No JVD    Respiratory/Chest: CTA Bilaterally without wheezes or rales. Normal effort    GI/Abdomen: BS +, non-tender,  non-distended Ext: no clubbing, cyanosis, LLE edema Psych: pleasant and cooperative  Skin: left AK incision clean with minima drainage, fairly well approximated. Foam dressing over anterior abrasions on thigh. Neuro:  Alert and oriented x 3. Normal insight and awareness. Intact Memory. Normal language and speech. Cranial nerve exam unremarkable. MMT: UE 5/5. RLE 5/5. LLE limited by pain and 2-3/5 HF. Sensory exam normal for light touch and pain in all 4 limbs. No limb ataxia or cerebellar signs. No abnormal tone appreciated.   No neuro changes 10/6  Musculoskeletal: left thigh swollen and somewhat tender    Assessment/Plan: 1. Functional deficits which require 3+ hours per day of interdisciplinary therapy in a comprehensive inpatient rehab setting. Physiatrist is providing close team supervision and 24 hour management of active medical problems listed below. Physiatrist and rehab team continue to assess barriers to discharge/monitor patient progress toward functional and medical goals  Care Tool:  Bathing    Body parts bathed by patient: Right arm, Left arm, Chest, Abdomen, Front perineal area, Buttocks, Right upper leg, Left upper leg, Right lower leg, Face     Body parts n/a: Left lower leg   Bathing assist Assist Level: Minimal Assistance - Patient >  75%     Upper Body Dressing/Undressing Upper body dressing   What is the patient wearing?: Hospital gown only    Upper body assist Assist Level: Set up assist    Lower Body Dressing/Undressing Lower body dressing      What is the patient wearing?: Underwear/pull up, Pants     Lower body assist Assist for lower body dressing: Minimal Assistance - Patient > 75%     Toileting Toileting    Toileting assist Assist for toileting: Minimal Assistance - Patient > 75%     Transfers Chair/bed transfer  Transfers assist     Chair/bed transfer assist level: Minimal Assistance - Patient > 75%      Locomotion Ambulation   Ambulation assist      Assist level: Minimal Assistance - Patient > 75% (+2 for equipment) Assistive device: Walker-rolling Max distance: 56ft   Walk 10 feet activity   Assist     Assist level: Minimal Assistance - Patient > 75% Assistive device: Walker-rolling   Walk 50 feet activity   Assist Walk 50 feet with 2 turns activity did not occur: Safety/medical concerns (fatigue)         Walk 150 feet activity   Assist Walk 150 feet activity did not occur: Safety/medical concerns (fatigue)         Walk 10 feet on uneven surface  activity   Assist Walk 10 feet on uneven surfaces activity did not occur: Safety/medical concerns (fatigue)         Wheelchair     Assist Is the patient using a wheelchair?: Yes Type of Wheelchair: Manual    Wheelchair assist level: Supervision/Verbal cueing Max wheelchair distance: 153ft    Wheelchair 50 feet with 2 turns activity    Assist        Assist Level: Supervision/Verbal cueing   Wheelchair 150 feet activity     Assist      Assist Level: Supervision/Verbal cueing   Blood pressure 139/65, pulse 60, temperature 98.1 F (36.7 C), resp. rate 20, height 5\' 2"  (1.575 m), weight 66.7 kg, SpO2 99%.  Medical Problem List and Plan: 1. 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  Antithrombotics: -DVT/anticoagulation:  Pharmaceutical: therapeutic dose lovenox, appreciate pharmacy help -Dopplers with left femoral DVT -pt may gently massage limb for pain purposes -can convert to eliquis prior to discharge as this is covered by pt's insurance.              -antiplatelet therapy: none   3. Pain Management:  Tylenol, oxycodone as needed             -increased gabapentin to 300 mg q HS (for phantom pain) with good results             -continue massage/tactile feedback (gentle for now), vision/mirror therapy 4. Mood/Behavior/Sleep: LCSW to evaluate and provide emotional support             -history of bipolar: continue Wellbutrin SR 150 mg BID             -antipsychotic agents: n/a   5. Neuropsych/cognition: This patient is capable of making decisions  on her own behalf.             -pt appears very positive and motivated to regain functional mobility 6. Skin/Wound Care: Routine skin care checks             -local dressing to incision, change daily prn.              -foam dressing to abrasions on anterior thigh  -shrinker sock left AKA ordered 7. 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 Renold Don    12: History of breast cancer on Arimadex 1 mg daily    14: Anemia: acute blood loss atop anemia chronic disease             -CBC with significant drop to 7.6 10/5-->f/u 10/6 stable at 7.7  -continue iron supplement  -check stool for ob x1 although unlikely  -Monday labs ordered   15: GERD: continue PPI      LOS: 2 days A FACE TO FACE EVALUATION WAS PERFORMED  Ranelle Oyster 11/15/2022, 10:05 AM

## 2022-11-15 NOTE — Plan of Care (Signed)
  Problem: Consults Goal: RH LIMB LOSS PATIENT EDUCATION Description: Description: See Patient Education module for eduction specifics. Outcome: Progressing   Problem: RH BOWEL ELIMINATION Goal: RH STG MANAGE BOWEL WITH ASSISTANCE Description: STG Manage Bowel with toileting Assistance. Outcome: Progressing Goal: RH STG MANAGE BOWEL W/MEDICATION W/ASSISTANCE Description: STG Manage Bowel with Medication with mod I  Assistance. Outcome: Progressing   Problem: RH SKIN INTEGRITY Goal: RH STG SKIN FREE OF INFECTION/BREAKDOWN Description: Manage w min assist Outcome: Progressing Goal: RH STG ABLE TO PERFORM INCISION/WOUND CARE W/ASSISTANCE Description: STG Able To Perform Incision/Wound Care With min Assistance. Outcome: Progressing   Problem: RH SAFETY Goal: RH STG ADHERE TO SAFETY PRECAUTIONS W/ASSISTANCE/DEVICE Description: STG Adhere to Safety Precautions With cues Assistance/Device. Outcome: Progressing   Problem: RH PAIN MANAGEMENT Goal: RH STG PAIN MANAGED AT OR BELOW PT'S PAIN GOAL Description: < 4 with prns Outcome: Progressing   Problem: RH KNOWLEDGE DEFICIT LIMB LOSS Goal: RH STG INCREASE KNOWLEDGE OF SELF CARE AFTER LIMB LOSS Description: Patient and S.O. will be able to manage care at discharge using educational resources independently Outcome: Progressing   Problem: Education: Goal: Knowledge of the prescribed therapeutic regimen will improve Outcome: Progressing Goal: Ability to verbalize activity precautions or restrictions will improve Outcome: Progressing Goal: Understanding of discharge needs will improve Outcome: Progressing   Problem: Activity: Goal: Ability to perform//tolerate increased activity and mobilize with assistive devices will improve Outcome: Progressing   Problem: Clinical Measurements: Goal: Postoperative complications will be avoided or minimized Outcome: Progressing   Problem: Self-Care: Goal: Ability to meet self-care needs will  improve Outcome: Progressing   Problem: Self-Concept: Goal: Ability to maintain and perform role responsibilities to the fullest extent possible will improve Outcome: Progressing   Problem: Pain Management: Goal: Pain level will decrease with appropriate interventions Outcome: Progressing   Problem: Skin Integrity: Goal: Demonstration of wound healing without infection will improve Outcome: Progressing   Problem: Education: Goal: Knowledge of the prescribed therapeutic regimen will improve Outcome: Progressing Goal: Individualized Educational Video(s) Outcome: Progressing   Problem: Activity: Goal: Ability to avoid complications of mobility impairment will improve Outcome: Progressing Goal: Range of joint motion will improve Outcome: Progressing   Problem: Clinical Measurements: Goal: Postoperative complications will be avoided or minimized Outcome: Progressing   Problem: Pain Management: Goal: Pain level will decrease with appropriate interventions Outcome: Progressing   Problem: Skin Integrity: Goal: Will show signs of wound healing Outcome: Progressing

## 2022-11-15 NOTE — Progress Notes (Signed)
Physical Therapy Session Note  Patient Details  Name: Tammy [name] MRN: [id number] Date of Birth: 1956/01/02  Today's Date: 11/15/2022 PT Individual Time: 1030-1126, 0272-5366 PT Individual Time Calculation (min): 56 min, 43 min   Short Term Goals: Week 1:  PT Short Term Goal 1 (Week 1): pt will transfer bed<>chair with LRAD and supervision PT Short Term Goal 2 (Week 1): pt will transfer sit<>stand with LRAD and supervision PT Short Term Goal 3 (Week 1): pt will perform car transfer with LRAD and CGA  Skilled Therapeutic Interventions/Progress Updates:      Treatment Session 1  Pt supine in bed upon arrival. Pt agreeable to therapy. Pt denies any pain. Pt managing IV throughout session.   Therapy at bed level per MD orders 2/2 new DVT. Education provided today on purpose of bed level activity.   Discussed home equipment: pt has RW, shower chair, grab bars, and BSC and reacher. Pt needs a WC.   Pt donned underwear with supervison with combination of bridging and rolling technique, verbal cues provided for technique.   Pt performed the following therex for B LE strengthening  1x10 SLR B  1x10 hip abduction B 1x10 bridges 1x10 R sidelying L hip abduction-therapist providing verbal and tactile cues to ensure proper positioning and reduce hip flexor and trunk rotation compensation.  1x10 R sidelying L hip extension-threpaist providing verbal and tactile cues to ensure proper positioning and reduce compensation.  1x10 glute set B  Attempted prone therex, however pt unable to get into prone positioning 2/2 pain and weakness   Pt able to recall need for shrinker. Therapist followed up with [employer]. Shrinker should arrive by tomorrow.   Discussed positioning of L LE. Pt able to recall not placing pillow under L LE to reduce risk of hip flexion contracture and assist with prosthetic fitting and ambulation with prosthetic.   Pt performed rolling to R with supervision without bed  rails, and increased time, verbal cues provided for technique. Pt performed rolling to L without bed rails and min A, pt reports 6/10 L residual limb pain on L side, unable to tolerate. Education and demonstration provided for desensitization. Pt returned demonstration with supervision. Pt reports pain reduced to 4/10.   Pt requesting to use bathroom. Pt donned/doffed underwear with mod I, pt demos recall and carry over of technique from earlier in session. Pt continent of bladder with in urinal with supervision, verbal cues provided to ensure proper placement.   Pt also reports intermittent phantom pain in L knee and ankle, education provided for mirror therapy. Will trail next session.   Pt supine in bed at end of session with all needs within reach and bed alarm on.   Treatment Session 2   Pt supine in bed upon arrival. Pt agreeable to therapy. Pt reports 2/10 residual limb pain, with phantom sensation in L knee and ankle.    Pt therapy remains at bed level today 2/2 MD orders.   Pt performed mirror therapy while performing supine therex for B LE strengthening and to reduce phantom sensations.    2x10 SLR BLE   2x10 hip abduction B LE   2x10 quad sets R LE   2x10 ankle pumps R LE   Pt reports "something is crawling across my calf" with R LE quad sets on first set, and on 2nd set reports "my left knee is touching the bed too".  pt reports "my left leg is really tired" with R LE ankle pumps on  first trial. On 2nd trail, pt reports "I feel like my left leg is suspended and there but I don't have any pain."   Pt performed the following therex for B LE strengthening.   1x10 bridges holding for 3 seconds  1x10 glute sets  1x10 R sidyelying L hip abduction 1x10 R sidelying L hip extension  Pt performed rolling B with supervision to R and min A to L, pt reports pain increases to 7/10 in L sidelying on distal lateral aspect of residual limb, reduced back to 4 with L sidelying, opted not to do  any exercises in sidelying 2/2 DVT in L leg.   Pt supine in bed at end of session with all needs within reach and seatbelt alarm on.                   Therapy Documentation Precautions:  Precautions Precautions: Fall Restrictions Weight Bearing Restrictions: Yes LLE Weight Bearing: Non weight bearing  Therapy/Group: Individual Therapy  [facility] [name], [place], DPT  11/15/2022, 7:51 AM

## 2022-11-15 NOTE — Progress Notes (Signed)
Occupational Therapy Session Note  Patient Details  Name: Tammy [name] MRN: [id number] Date of Birth: June 04, 1955  {CHL IP REHAB OT TIME CALCULATIONS:304400400}   Short Term Goals: Week 1:  OT Short Term Goal 1 (Week 1): Pt will stand up to 3 min for sinkside tasks with close S OT Short Term Goal 2 (Week 1): Transfer to TTB with dist S OT Short Term Goal 3 (Week 1): LB dressing with dist s  Skilled Therapeutic Interventions/Progress Updates:  Pt received *** for skilled OT session with focus on ***. Pt agreeable to interventions, demonstrating overall *** mood. Pt reported ***/10 pain, stating "***" in reference to ***. OT offering intermediate rest breaks and positioning suggestions throughout session to address pain/fatigue and maximize participation/safety in session.    Pt remained *** with all immediate needs met at end of session. Pt continues to be appropriate for skilled OT intervention to promote further functional independence.   Therapy Documentation Precautions:  Precautions Precautions: Fall Restrictions Weight Bearing Restrictions: Yes LLE Weight Bearing: Non weight bearing   Therapy/Group: Individual Therapy  Lou Cal, OTR/L, MSOT  11/15/2022, 4:35 PM

## 2022-11-15 NOTE — Progress Notes (Signed)
Uneventful night. Using female urinal at [facility] with staff emptying. Patient reports LBM 10/04. Educated patient not to place a pillow under left AKA and monitor skin to right foot/heel.  Dressing to left AKA. Foam dressing to left thigh. PRN oxy ir 15mg 's given at 2308. Continues to rate pain 8. Alfredo Martinez A

## 2022-11-16 DIAGNOSIS — I10 Essential (primary) hypertension: Secondary | ICD-10-CM | POA: Diagnosis not present

## 2022-11-16 DIAGNOSIS — Z89612 Acquired absence of left leg above knee: Secondary | ICD-10-CM

## 2022-11-16 DIAGNOSIS — E876 Hypokalemia: Secondary | ICD-10-CM

## 2022-11-16 DIAGNOSIS — I82412 Acute embolism and thrombosis of left femoral vein: Secondary | ICD-10-CM

## 2022-11-16 LAB — CBC
HCT: 24.7 % — ABNORMAL LOW (ref 36.0–46.0)
Hemoglobin: 7.8 g/dL — ABNORMAL LOW (ref 12.0–15.0)
MCH: 27.8 pg (ref 26.0–34.0)
MCHC: 31.6 g/dL (ref 30.0–36.0)
MCV: 87.9 fL (ref 80.0–100.0)
Platelets: 357 10*3/uL (ref 150–400)
RBC: 2.81 MIL/uL — ABNORMAL LOW (ref 3.87–5.11)
RDW: 15 % (ref 11.5–15.5)
WBC: 4.7 10*3/uL (ref 4.0–10.5)
nRBC: 0 % (ref 0.0–0.2)

## 2022-11-16 LAB — BASIC METABOLIC PANEL
Anion gap: 13 (ref 5–15)
BUN: 10 mg/dL (ref 8–23)
CO2: 26 mmol/L (ref 22–32)
Calcium: 8.9 mg/dL (ref 8.9–10.3)
Chloride: 101 mmol/L (ref 98–111)
Creatinine, Ser: 0.56 mg/dL (ref 0.44–1.00)
GFR, Estimated: >60 mL/min (ref >60)
Glucose, Bld: 89 mg/dL (ref 70–99)
Potassium: 3.2 mmol/L — ABNORMAL LOW (ref 3.5–5.1)
Sodium: 140 mmol/L (ref 135–145)

## 2022-11-16 MED ORDER — POTASSIUM CHLORIDE CRYS ER 20 MEQ PO TBCR
20.0000 meq | EXTENDED_RELEASE_TABLET | Freq: Two times a day (BID) | ORAL | Status: DC
  Administered 2022-11-16 – 2022-11-19 (×6): 20 meq via ORAL
  Filled 2022-11-16 (×6): qty 1

## 2022-11-16 NOTE — Discharge Instructions (Addendum)
Inpatient Rehab Discharge Instructions  Tammy [name] Discharge date and time:  11/23/2022  Activities/Precautions/ Functional Status: Activity: no lifting, driving, or strenuous exercise until cleared by MD Diet: cardiac diet Wound Care: keep wound clean and dry Functional status:  ___ No restrictions     ___ Walk up steps independently _x__ 24/7 supervision/assistance   ___ Walk up steps with assistance ___ Intermittent supervision/assistance  ___ Bathe/dress independently ___ Walk with walker     ___ Bathe/dress with assistance ___ Walk Independently    ___ Shower independently ___ Walk with assistance    __x_ Shower with assistance __x_ No alcohol     ___ Return to work/school ________  Special Instructions: No driving, alcohol consumption or tobacco use.  COMMUNITY REFERRALS UPON DISCHARGE:    Home Health:   PT   OT    RN                  Agency:[facility] Phone:[phone]   Medical Equipment/Items Ordered:WHEELCHAIR                                                 Agency/Supplier:[facility]   [phone]    My questions have been answered and I understand these instructions. I will adhere to these goals and the provided educational materials after my discharge from the hospital.  Patient/Caregiver Signature _______________________________ Date __________  Clinician Signature _______________________________________ Date __________  Please bring this form and your medication list with you to all your follow-up doctor's appointments.

## 2022-11-16 NOTE — Progress Notes (Signed)
Occupational Therapy Session Note  Patient Details  Name: Tammy [name] MRN: [id number] Date of Birth: 13-Oct-1955  {CHL IP REHAB OT TIME CALCULATIONS:304400400}  {CHL IP REHAB OT TIME CALCULATIONS:304400400}  Short Term Goals: Week 1:  OT Short Term Goal 1 (Week 1): Pt will stand up to 3 min for sinkside tasks with close S OT Short Term Goal 2 (Week 1): Transfer to TTB with dist S OT Short Term Goal 3 (Week 1): LB dressing with dist s  Skilled Therapeutic Interventions/Progress Updates:   Session 1: Pt received *** for skilled OT session with focus on ***. Pt agreeable to interventions, demonstrating overall *** mood. Pt reported ***/10 pain, stating "***" in reference to ***. OT offering intermediate rest breaks and positioning suggestions throughout session to address pain/fatigue and maximize participation/safety in session.    Pt remained *** with all immediate needs met at end of session. Pt continues to be appropriate for skilled OT intervention to promote further functional independence.   Session 2: Pt received *** for skilled OT session with focus on ***. Pt agreeable to interventions, demonstrating overall *** mood. Pt reported ***/10 pain, stating "***" in reference to ***. OT offering intermediate rest breaks and positioning suggestions throughout session to address pain/fatigue and maximize participation/safety in session.    Pt remained *** with all immediate needs met at end of session. Pt continues to be appropriate for skilled OT intervention to promote further functional independence.    Therapy Documentation Precautions:  Precautions Precautions: Fall Restrictions Weight Bearing Restrictions: Yes LLE Weight Bearing: Non weight bearing   Therapy/Group: Individual Therapy  Lou Cal, OTR/L, MSOT  11/16/2022, 9:32 PM

## 2022-11-16 NOTE — Progress Notes (Signed)
Physical Therapy Session Note  Patient Details  Name: Tammy [name] MRN: [id number] Date of Birth: [birth year]  Today's Date: 11/16/2022 PT Individual Time: 0800-0859, 4098-1191 PT Individual Time Calculation (min): 59 min, 72 min   Short Term Goals: Week 1:  PT Short Term Goal 1 (Week 1): pt will transfer bed<>chair with LRAD and supervision PT Short Term Goal 2 (Week 1): pt will transfer sit<>stand with LRAD and supervision PT Short Term Goal 3 (Week 1): pt will perform car transfer with LRAD and CGA  Skilled Therapeutic Interventions/Progress Updates:      Pt supine in bed upon arrival. Pt agreeable to therapy. Pt reports 7/10 L residual limb pain 2/2 L BKA shrinker.  Pt reports [employer] delivered and donned shrinker last night. Therapist doffed shrinker and performed skin assessment. Pt has indentation on posteiror thigh where shrinker is rolled up. Removed shrinker for this session 2/2 pt pain and awaiting verification from MD 2/2 L LE DVT. MD approved donning of shrinker via secure chat. Pt donned grey BKA shrinker with mod A, and pt reports 7/10 pain with donning, pt reports too tight. Notified Scott from [employer]. Scott plans to bring new shrinker to trial.   Pt reports need to use bathroom. Pt remaining bed level until able to verify with MD. Pt continent of bladder in urinal. Pt donned and doffed night gown with supervision while seated in long sitting in bed. Pt donned underwear with set up assist with use of briding tehcnique.   MD cleared pt to resume OOB activity on secure chat.   Pt performed sit to stand x3 with RW and CGA. Verbal cues provided for technique.   1x10 standing R heel raises 1x10 standing L hip abduction 1x10 standing L hip extension  Pt performed lateral scooting on EOB with supervision. Pt performed stand pivot transfer with RW and CGA, hospital bed to recliner verbal cues provided for technique.   Pt seated in recliner with all needs within reach  and chair alarm on.    Treatment Session 2  Pt seated in recliner upon arrival. Pt agreeable to therapy. Pt reports 7/10 pain with stair navigation in L residual limb, decreasing to 5/10 with seated rest breaks.   Donned R shoe while seated in WC with supervision and increased time for tying.   Pt Ascneded/descended 4 6 inch steps with shower chair technique and min A with significant FOF, verbal and tactile cues provided for LE positioning and technique, therapist managing shower chair throughout   Pt reports fiance has begun installation of ramp but unsure if it will be finished upon dsicharge.   Pt ambulated with RW and CGA with hop to gait, verbal cues provided for technique for improved R LE clearance.  Stand pivot transfer to car simulator with Rw and CGA, verbal cues provided for technique.   Ascneded/descended ramp x2 with WC, asended forwards first trail with min A and backwards 2nd trial with supervision, verbal cues provided for technique.   Stand pivot transfer to apartment bed with CGA,pt demos improved technique, verbal cues for positining of B UE on arm rests for power up for improved stability. Sit to supine, and supine to sit with supervision.   New shrinker delivered to pt room. Doffed ace bandage, and pt donned new shrinker with mod A and increased time. Pt reports it is more comfortable than previously delivered shrinker with strap.   Pt supine in bed with all needs within reach and bed alarm on with daughter  in room.      Therapy Documentation Precautions:  Precautions Precautions: Fall Restrictions Weight Bearing Restrictions: Yes LLE Weight Bearing: Non weight bearing  Therapy/Group: Individual Therapy  [facility] [name], [place], DPT  11/16/2022, 7:38 AM

## 2022-11-16 NOTE — Discharge Summary (Signed)
Physician Discharge Summary  Patient ID: Tammy [name] MRN: [id number] DOB/AGE: 08/19/1955 67 y.o.  Admit date: 11/13/2022 Discharge date: 11/23/2022  Discharge Diagnoses:  Principal Problem:   Left above-knee amputee [facility]) Active Problems:   Bipolar 1 disorder, depressed, partial remission (HCC) Active problems: Functional deficits secondary to left above-the-knee amputation Phantom pain History of bipolar disorder History of hypertension History of hyperlipidemia Hardware associated osteomyelitis History of breast cancer Hypokalemia Anemia Gastroesophageal reflux disease  Discharged Condition: good  Significant Diagnostic Studies: none  Labs:  Basic Metabolic Panel: Recent Labs  Lab 11/17/22 0408 11/19/22 0340 11/23/22 0546  NA 139 138 139  K 3.6 3.9 3.8  CL 103 103 104  CO2 26 26 24   GLUCOSE 96 98 107*  BUN 10 11 15   CREATININE 0.60 0.76 0.86  CALCIUM 9.2 9.4 9.5    CBC: Recent Labs  Lab 11/19/22 0340 11/23/22 0546  WBC 5.0 3.8*  HGB 8.1* 8.8*  HCT 26.1* 28.8*  MCV 88.2 87.5  PLT 358 353    Brief HPI:   Tammy [name] is a 67 y.o. female who underwent open reduction of her distal femur fracture April 10, 2021. Patient eventually had the hardware removed in March 2024 with irrigation and debridement with Dr. [name] 3 separate times as well as distal femur replacement with total knee arthroplasty. Patient has undergone 2 wound debridements and application of myriad with Dr. Ulice Bold on May 6 and August 5. She continued to have purulent drainage in the wound vac; and the wound did not heal with significant pain in the infected area. Skin graft trial previously from plastic surgery failed as well. The hardware remained exposed. She underwent left AKA by Dr. [name] on 11/04/2022. Wound VAC removed 10/02. The amputation site wound cultures remain negative. She will continue on vancomycin and ceftriaxone through 11/05 and follow with ID.  Tolerating diet. She continues to progress ambulation in the hallway with RW CGA/Min A.    Hospital Course: Torey Chestnut was admitted to rehab 11/13/2022 for inpatient therapies to consist of PT, ST and OT at least three hours five days a week. Past admission physiatrist, therapy team and rehab RN have worked together to provide customized collaborative inpatient rehab. Follow-up labs revealed hypokalemia and given daily supplementation. Drop in Hgb from 10.4 on acute side to 7.6 on follow-up labs 10/05. Started oral iron supplementation. Hemoccult ordered. Screening BLE venous duplex revealed DVT and therapeutic Lovenox ordered. Increased gabapentin dosing for phantom pain. Continued to  need oxycodone for pain control. Shrinker stocking applied. Follow-up labs with normal potassium level. Will continue 10 mEq daily at discharge. Convert from Lovenox to Eliquis for DVT treatment. Hemoglobin continues to improve. Continue prenatal home vitamin which contains iron.  Blood pressures were monitored on TID basis and amlodipine 10 mg daily and hydralazine 25 mg BID continued. Hydralazine increased to 75 mg TID.   Rehab course: During patient's stay in rehab weekly team conferences were held to monitor patient's progress, set goals and discuss barriers to discharge. At admission, patient required min with mobility and basic self-care skills.  She  has had improvement in activity tolerance, balance, postural control as well as ability to compensate for deficits. She has had improvement in functional use LLE as well as improvement in awareness. ession on functional transfer, gait training, and core strengthening exercises. Pt propells w/c on unit with BUE mod I and occasionally using RLE as well for overall strengthening and endurance. Performed basic squat pivot transfers  modified independent using rails and managing w/c parts. Performed simulated car transfer to SUV height for home practice with  supervision.   Discharge disposition: 06-Home-Health Care Svc     Diet: heart healthy  Special Instructions: No driving, alcohol consumption or tobacco use.  IV abx continue through 11/05. Follow-up with ID arranged.  30-35 minutes were spent on discharge planning and discharge summary.  Discharge Instructions     [facility] pharmacist to adjust dose for Vancomycin, Aminoglycosides and other anti-infective therapies as requested by physician.   Complete by: As directed    [facility] to provide Cath Flo 2mg    Complete by: As directed    Administer for PICC line occlusion and as ordered by physician for other access device issues.   Ambulatory referral to Physical Medicine Rehab   Complete by: As directed    Anaphylaxis Kit: Provided to treat any anaphylactic reaction to the medication being provided to the patient if First Dose or when requested by physician   Complete by: As directed    Epinephrine 1mg /ml vial / amp: Administer 0.3mg  (0.78ml) subcutaneously once for moderate to severe anaphylaxis, nurse to call physician and pharmacy when reaction occurs and call 911 if needed for immediate care   Diphenhydramine 50mg /ml IV vial: Administer 25-50mg  IV/IM PRN for first dose reaction, rash, itching, mild reaction, nurse to call physician and pharmacy when reaction occurs   Sodium Chloride 0.9% NS IV: Administer if needed for hypovolemic blood pressure drop or as ordered by physician after call to physician with anaphylactic reaction   Change dressing on IV access line weekly and PRN   Complete by: As directed    Discharge patient   Complete by: As directed    Discharge disposition: 06-Home-Health Care Svc   Discharge patient date: 11/23/2022   Flush IV access with Sodium Chloride 0.9% and Heparin 10 units/ml or 100 units/ml   Complete by: As directed    Home infusion instructions - [facility]   Complete by: As directed    Instructions:  Flush IV access with Sodium Chloride 0.9% and Heparin 10units/ml or 100units/ml   Change dressing on IV access line: Weekly and PRN   Instructions Cath Flo 2mg : Administer for PICC Line occlusion and as ordered by physician for other access device   [facility] pharmacist to adjust dose for: Vancomycin, Aminoglycosides and other anti-infective therapies as requested by physician   Method of administration may be changed at the discretion of home infusion pharmacist based upon assessment of the patient and/or caregiver's ability to self-administer the medication ordered   Complete by: As directed       Allergies as of 11/23/2022       Reactions   Lisinopril Cough        Medication List     STOP taking these medications    cefTRIAXone IVPB Commonly known as: ROCEPHIN   meloxicam 15 MG tablet Commonly known as: MOBIC   oxyCODONE-acetaminophen 5-325 MG tablet Commonly known as: PERCOCET/ROXICET       TAKE these medications    acetaminophen 325 MG tablet Commonly known as: Tylenol Take 2 tablets (650 mg total) by mouth every 6 (six) hours as needed. What changed:  medication strength how much to take when to take this reasons to take this   amLODipine 10 MG tablet Commonly known as: NORVASC Take 1 tablet (10 mg total) by mouth daily.   anastrozole 1 MG tablet Commonly known as: ARIMIDEX TAKE  1 TABLET BY MOUTH DAILY   apixaban 5 MG Tabs tablet Commonly known as: ELIQUIS Take 1 tablet (5 mg total) by mouth 2 (two) times daily.   BENGAY EX Apply 1 Application topically daily as needed (pain).   buPROPion 150 MG 12 hr tablet Commonly known as: WELLBUTRIN SR Take 150 mg by mouth 2 (two) times daily.   calcium carbonate 1250 (500 Ca) MG tablet Commonly known as: OS-CAL - dosed in mg of elemental calcium Take 1 tablet by mouth daily.   cefTRIAXone 2 g in sodium chloride 0.9 % 100 mL Inject 2 g into the vein daily.   gabapentin 300 MG  capsule Commonly known as: NEURONTIN Take 1 capsule (300 mg total) by mouth 2 (two) times daily AND 2 capsules (600 mg total) at bedtime. What changed:  medication strength See the new instructions.   hydrALAZINE 25 MG tablet Commonly known as: APRESOLINE Take 3 tablets (75 mg total) by mouth every 8 (eight) hours. What changed:  how much to take when to take this   melatonin 5 MG Tabs Take 1 tablet (5 mg total) by mouth at bedtime.   Oxycodone HCl 10 MG Tabs Take 1-1.5 tablets (10-15 mg total) by mouth every 4 (four) hours as needed for up to 7 days for severe pain (pain score 7-10).   pantoprazole 40 MG tablet Commonly known as: PROTONIX Take 1 tablet (40 mg total) by mouth daily.   potassium chloride 10 MEQ tablet Commonly known as: KLOR-CON Take 10 mEq by mouth daily.   PRENATAL PO Take 1 tablet by mouth daily.   promethazine 12.5 MG tablet Commonly known as: PHENERGAN Take 1 tablet (12.5 mg total) by mouth every 8 (eight) hours as needed for nausea or vomiting.   vancomycin 750 MG/150ML Soln Commonly known as: VANCOREADY Inject 150 mLs (750 mg total) into the vein every 12 (twelve) hours. What changed: You were already taking a medication with the same name, and this prescription was added. Make sure you understand how and when to take each.   vancomycin IVPB Inject 750 mg into the vein every 12 (twelve) hours. Indication:  osteomyelitis s/p AKA with retained hardware First Dose: No Last Day of Therapy:  12/15/22 Labs - "Sunday/Monday:  CBC/D, BMP, and vancomycin trough. Labs - Thursday:  BMP and vancomycin trough Labs - Once weekly: ESR and CRP Method of administration:Elastomeric Method of administration may be changed at the discretion of the patient and/or caregiver's ability to self-administer the medication ordered. What changed: Another medication with the same name was added. Make sure you understand how and when to take each.   VITAMIN B-12 PO Take  2,000 mcg by mouth daily.   vitamin C 1000 MG tablet Take 1,000 mg by mouth daily.   zinc gluconate 50 MG tablet Take 50 mg by mouth daily.               Discharge Care Instructions  (From admission, onward)           Start     Ordered   11/23/22 0000  Change dressing on IV access line weekly and PRN  (Home infusion instructions - [facility] )        10" /14/24 4010            Follow-up Information     Fanny Dance, MD Follow up.   Specialty: Physical Medicine and Rehabilitation Why: As needed Contact information: [address] [place] [address]  [phone]         Nadara Mustard, MD Follow up.   Specialty: Orthopedic Surgery Why: Call the office in 1-2 days to make arrangements for hosptial follow-up appointment. Contact information: [address] [place] [address] [phone]         Quitman Livings, MD Follow up.   Specialty: Internal Medicine Why: Call the office in 1-2 days to make arrangements for hosptial follow-up appointment. Check serum potassium level and discuss continuation of potassium supplementation. Contact information: [address], [address] [place] [address] [phone]                 Signed: Milinda Antis 11/23/2022, 9:39 AM

## 2022-11-16 NOTE — Progress Notes (Signed)
Inpatient Rehabilitation  Patient information reviewed and entered into eRehab system by Oyuki Hogan M. Eri Mcevers, M.A., CCC/SLP, PPS Coordinator.  Information including medical coding, functional ability and quality indicators will be reviewed and updated through discharge.    

## 2022-11-16 NOTE — Progress Notes (Signed)
PROGRESS NOTE   Subjective/Complaints: Sitting in bedside chair. Discussed DVT treatment plan. Pain is controlled with current medications.   Review of Systems  Constitutional:  Negative for fever.  Respiratory:  Negative for shortness of breath.   Cardiovascular:  Negative for chest pain.  Gastrointestinal:  Negative for abdominal pain, nausea and vomiting.  Genitourinary: Negative.   Musculoskeletal:  Positive for joint pain.  Skin:  Negative for rash.  Neurological:  Negative for headaches.      Objective:   VAS [place] LOWER EXTREMITY VENOUS (DVT)  Result Date: 11/14/2022  Lower Venous DVT Study Patient Name:  Tammy [name]  Date of Exam:   11/14/2022 Medical Rec #: [id number]                 Accession #:    [id number] Date of Birth: [birth year]                  Patient Gender: F Patient Age:   67 years Exam Location:  [facility] Procedure:      VAS [place] LOWER EXTREMITY VENOUS (DVT) Referring Phys: Wendi Maya --------------------------------------------------------------------------------  Indications: In rehabilitation status post left AKA.  Risk Factors: Immobility. Anticoagulation: Lovenox. Comparison Study: Prior negative left LEV done 07/14/22 Performing Technologist: Sherren Kerns RVS  Examination Guidelines: A complete evaluation includes B-mode imaging, spectral Doppler, color Doppler, and power Doppler as needed of all accessible portions of each vessel. Bilateral testing is considered an integral part of a complete examination. Limited examinations for reoccurring indications may be performed as noted. The reflux portion of the exam is performed with the patient in reverse Trendelenburg.  +---------+---------------+---------+-----------+----------+--------------+ RIGHT    CompressibilityPhasicitySpontaneityPropertiesThrombus Aging +---------+---------------+---------+-----------+----------+--------------+  CFV      Full           Yes      Yes                                 +---------+---------------+---------+-----------+----------+--------------+ SFJ      Full                                                        +---------+---------------+---------+-----------+----------+--------------+ FV Prox  Full                                                        +---------+---------------+---------+-----------+----------+--------------+ FV Mid   Full                                                        +---------+---------------+---------+-----------+----------+--------------+ FV DistalFull                                                        +---------+---------------+---------+-----------+----------+--------------+  PFV      Full                                                        +---------+---------------+---------+-----------+----------+--------------+ POP      Full           Yes      Yes                                 +---------+---------------+---------+-----------+----------+--------------+ PTV      Full                                                        +---------+---------------+---------+-----------+----------+--------------+ PERO     Full                                                        +---------+---------------+---------+-----------+----------+--------------+   +---------+---------------+---------+-----------+----------+--------------+ LEFT     CompressibilityPhasicitySpontaneityPropertiesThrombus Aging +---------+---------------+---------+-----------+----------+--------------+ CFV      Full           Yes      Yes                                 +---------+---------------+---------+-----------+----------+--------------+ SFJ      Full                                                        +---------+---------------+---------+-----------+----------+--------------+ FV Prox  Full                                                         +---------+---------------+---------+-----------+----------+--------------+ FV Mid   Full                                                        +---------+---------------+---------+-----------+----------+--------------+ FV DistalFull                                                        +---------+---------------+---------+-----------+----------+--------------+ PFV      Full                                                        +---------+---------------+---------+-----------+----------+--------------+  POP      Full           Yes      Yes                                 +---------+---------------+---------+-----------+----------+--------------+ PTV      Full                                                        +---------+---------------+---------+-----------+----------+--------------+ PERO     Full                                                        +---------+---------------+---------+-----------+----------+--------------+    Summary: BILATERAL: - No evidence of deep vein thrombosis seen in the lower extremities, bilaterally. -No evidence of popliteal cyst, bilaterally.   *See table(s) above for measurements and observations.    Preliminary    Recent Labs    11/14/22 0413 11/15/22 0516 11/16/22 0439  WBC 5.5  --  4.7  HGB 7.6* 7.7* 7.8*  HCT 24.2* 24.7* 24.7*  PLT 340  --  357   Recent Labs    11/14/22 0413 11/16/22 0439  NA 139 140  K 3.2* 3.2*  CL 101 101  CO2 27 26  GLUCOSE 98 89  BUN 13 10  CREATININE 0.52 0.56  CALCIUM 8.7* 8.9    Intake/Output Summary (Last 24 hours) at 11/16/2022 1126 Last data filed at 11/16/2022 0757 Gross per 24 hour  Intake 792.71 ml  Output 1100 ml  Net -307.29 ml        Physical Exam: Vital Signs Blood pressure (!) 155/77, pulse 64, temperature 97.7 F (36.5 C), temperature source Oral, resp. rate 18, height 5\' 2"  (1.575 m), weight 66.7 kg, SpO2 96%.  Constitutional: No  distress . Vital signs reviewed. Sitting in bedside chair HEENT: NCAT, EOMI, oral membranes moist Neck: supple Cardiovascular: RRR without murmur. No JVD    Respiratory/Chest: CTA Bilaterally without wheezes or rales. Normal effort    GI/Abdomen: BS +, non-tender, non-distended Ext: no clubbing, cyanosis, LLE edema Psych: pleasant and cooperative  Skin: left AK incision clean with minima drainage, fairly well approximated. Foam dressing over anterior abrasions on thigh. Neuro:  Alert and oriented x 3 Follows commands. . Normal insight and awareness. Intact Memory. Normal language and speech. Cranial nerve exam unremarkable. MMT: UE 5/5. RLE 5/5. LLE limited by pain and 2-3/5 HF. Sensory exam normal for light touch and pain in all 4 limbs. No limb ataxia or cerebellar signs. No abnormal tone appreciated.    Musculoskeletal: left thigh swollen and somewhat tender    Assessment/Plan: 1. Functional deficits which require 3+ hours per day of interdisciplinary therapy in a comprehensive inpatient rehab setting. Physiatrist is providing close team supervision and 24 hour management of active medical problems listed below. Physiatrist and rehab team continue to assess barriers to discharge/monitor patient progress toward functional and medical goals  Care Tool:  Bathing    Body parts bathed by patient: Right arm, Left arm, Chest, Abdomen, Front perineal area, Buttocks, Right upper leg, Left upper leg, Right  lower leg, Face     Body parts n/a: Left lower leg   Bathing assist Assist Level: Minimal Assistance - Patient > 75%     Upper Body Dressing/Undressing Upper body dressing   What is the patient wearing?: Hospital gown only    Upper body assist Assist Level: Set up assist    Lower Body Dressing/Undressing Lower body dressing      What is the patient wearing?: Underwear/pull up, Pants     Lower body assist Assist for lower body dressing: Minimal Assistance - Patient > 75%      Toileting Toileting    Toileting assist Assist for toileting: Minimal Assistance - Patient > 75%     Transfers Chair/bed transfer  Transfers assist     Chair/bed transfer assist level: Minimal Assistance - Patient > 75%     Locomotion Ambulation   Ambulation assist      Assist level: Minimal Assistance - Patient > 75% (+2 for equipment) Assistive device: Walker-rolling Max distance: 15ft   Walk 10 feet activity   Assist     Assist level: Minimal Assistance - Patient > 75% Assistive device: Walker-rolling   Walk 50 feet activity   Assist Walk 50 feet with 2 turns activity did not occur: Safety/medical concerns (fatigue)         Walk 150 feet activity   Assist Walk 150 feet activity did not occur: Safety/medical concerns (fatigue)         Walk 10 feet on uneven surface  activity   Assist Walk 10 feet on uneven surfaces activity did not occur: Safety/medical concerns (fatigue)         Wheelchair     Assist Is the patient using a wheelchair?: Yes Type of Wheelchair: Manual    Wheelchair assist level: Supervision/Verbal cueing Max wheelchair distance: 130ft    Wheelchair 50 feet with 2 turns activity    Assist        Assist Level: Supervision/Verbal cueing   Wheelchair 150 feet activity     Assist      Assist Level: Supervision/Verbal cueing   Blood pressure (!) 155/77, pulse 64, temperature 97.7 F (36.5 C), temperature source Oral, resp. rate 18, height 5\' 2"  (1.575 m), weight 66.7 kg, SpO2 96%.  Medical Problem List and Plan: 1. 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   Antithrombotics: -DVT/anticoagulation:  Pharmaceutical: therapeutic dose lovenox, appreciate pharmacy help -Dopplers with left femoral DVT -pt may gently massage limb for pain purposes -can convert to eliquis prior to discharge as this is covered by pt's insurance. Discussed DVT treatment plan with pt today             -antiplatelet therapy: none   3. Pain Management: Tylenol, oxycodone as needed             -increased gabapentin to 300 mg q HS (for phantom pain) with good results             -continue massage/tactile feedback (gentle for now), vision/mirror therapy 4. Mood/Behavior/Sleep: LCSW to evaluate and provide emotional support             -  history of bipolar: continue Wellbutrin SR 150 mg BID             -antipsychotic agents: n/a   5. Neuropsych/cognition: This patient is capable of making decisions on her own behalf.             -pt appears very positive and motivated to regain functional mobility 6. Skin/Wound Care: Routine skin care checks             -local dressing to incision, change daily prn.              -foam dressing to abrasions on anterior thigh  -shrinker sock left AKA ordered 7. Fluids/Electrolytes/Nutrition: Routine Is and Os and follow-up chemistries             -continue Vit C, zinc supplements   -hypokalemia--kdur daily   -10/7 increase kdur to BID for K+ 3.2  -added protein supp for low albumin 8: Hypertension: monitor TID and prn             -continue amlodipine 10 mg daily             -continue hydralazine 25 mg BID   -Continue to monitor trend, consider additional medication tomorrow if still above goal    11/16/2022    3:54 AM 11/15/2022    7:05 PM 11/15/2022    3:56 PM  Vitals with BMI  Systolic 155 176 528  Diastolic 77 82 66  Pulse 64 71 61    9: Hyperlipidemia: continue statin   11: Hardware associated osteomyelitis; 6 weeks abx end date 11/05             -continue vancomycin and ceftriaxone             -appointment on  12/01/2022 at 9AM with Dr. Renold Don    12: History of breast cancer on Arimadex 1 mg daily    14: Anemia: acute blood loss atop anemia chronic disease             -CBC with significant drop to 7.6 10/5-->f/u 10/6 stable at 7.7  -continue iron supplement  -check stool for ob x1 although unlikely  -10/7 HGB stable at  7.8   15: GERD: continue PPI      LOS: 3 days A FACE TO FACE EVALUATION WAS PERFORMED  Fanny Dance 11/16/2022, 11:26 AM

## 2022-11-16 NOTE — Progress Notes (Signed)
Patient ID: Maikayla Beggs, female   DOB: [birth year], 67 y.o.   MRN: [id number] Met with the patient to review current situation, rehab process, team conference and plan of care. Discussed secondary risks including HTN, and infected prosthetic joint with DVT, IV abx through 12/15/22. Patient with edema of residual limb; shrinker not fitting well. [facility] notified to reassess/re-fit.  This is the first time wound VAC off for months. Patient noted 5 ste to home. Continue to follow along to address educational needs to facilitate preparation for discharge. Pamelia Hoit

## 2022-11-16 NOTE — Progress Notes (Signed)
Inpatient Rehabilitation Care Coordinator Assessment and Plan Patient Details  Name: Tammy [name] MRN: [id number] Date of Birth: 1955-09-29  Today's Date: 11/16/2022  Hospital Problems: Principal Problem:   Left above-knee amputee [facility])  Past Medical History:  Past Medical History:  Diagnosis Date   Anemia    hx of   Anxiety    on meds   Arthritis    back/bilateral ankles   Bipolar disorder (HCC)    Breast cancer (HCC) 2022   LEFT breast-radiation   Depression    on meds   Family history of breast cancer 07/10/2020   Family history of colon cancer 07/10/2020   Family history of ovarian cancer 07/10/2020   GERD (gastroesophageal reflux disease)    on meds   Heart murmur    dx by PCP   Hepatitis C    HTN (hypertension)    on meds   Hx of radiation therapy    Liver cirrhosis (HCC)    from hep c   Neuromuscular disorder (HCC)    Osteopenia    Personal history of radiation therapy    Stroke (HCC) 09/2012   slurred speech and LEFT sided weakness/notes 10/04/2012   Past Surgical History:  Past Surgical History:  Procedure Laterality Date   AMPUTATION Left 11/04/2022   Procedure: LEFT ABOVE KNEE AMPUTATION;  Surgeon: Nadara Mustard, MD;  Location: [facility] OR;  Service: Orthopedics;  Laterality: Left;   ANKLE FRACTURE SURGERY Left 2018   APPLICATION OF WOUND VAC Left 06/15/2022   Procedure: APPLICATION OF WOUND VAC;  Surgeon: Peggye Form, DO;  Location: [facility] ORS;  Service: Plastics;  Laterality: Left;   APPLICATION OF WOUND VAC Left 09/14/2022   Procedure: APPLICATION OF WOUND VAC;  Surgeon: Peggye Form, DO;  Location: [facility] OR;  Service: Plastics;  Laterality: Left;   BREAST LUMPECTOMY Left 07/2020   BREAST LUMPECTOMY WITH RADIOACTIVE SEED AND SENTINEL LYMPH NODE BIOPSY Left 07/26/2020   Procedure: LEFT BREAST LUMPECTOMY WITH RADIOACTIVE SEED AND SENTINEL LYMPH NODE BIOPSY;  Surgeon: Abigail Miyamoto, MD;  Location: [facility];  Service:  General;  Laterality: Left;   CESAREAN SECTION  1986   COLONOSCOPY     DIAGNOSTIC LAPAROSCOPY  1985   HARDWARE REMOVAL Left 04/30/2022   Procedure: HARDWARE REMOVAL LEFT FEMUR;  Surgeon: Samson Frederic, MD;  Location: [facility] ORS;  Service: Orthopedics;  Laterality: Left;  150   HARDWARE REMOVAL Left 11/04/2022   Procedure: HARDWARE REMOVAL INFECTED LEFT TOTAL KNEE ARTHROPLASTY;  Surgeon: Nadara Mustard, MD;  Location: [facility] OR;  Service: Orthopedics;  Laterality: Left;   I & D KNEE WITH POLY EXCHANGE Left 06/07/2022   Procedure: IRRIGATION AND DEBRIDEMENT KNEE WITH POLY EXCHANGE;  Surgeon: Samson Frederic, MD;  Location: [facility]S;  Service: Orthopedics;  Laterality: Left;   INCISION AND DRAINAGE OF WOUND Left 06/04/2022   Procedure: IRRIGATION AND DEBRIDEMENT KNEE;  Surgeon: Samson Frederic, MD;  Location: [facility];  Service: Orthopedics;  Laterality: Left;  60   INCISION AND DRAINAGE OF WOUND Left 06/15/2022   Procedure: excision of knee wound with Myriad;  Surgeon: Peggye Form, DO;  Location: [facility]S;  Service: Plastics;  Laterality: Left;   ORIF FEMUR FRACTURE Left 04/10/2021   Procedure: OPEN REDUCTION INTERNAL FIXATION (ORIF) DISTAL FEMUR FRACTURE;  Surgeon: Myrene Galas, MD;  Location: [facility] OR;  Service: Orthopedics;  Laterality: Left;   PARTIAL KNEE ARTHROPLASTY Left 04/30/2022   Procedure: DISTAL FEMUR REPLACEMENT;  Surgeon: Samson Frederic, MD;  Location:  [facility] ORS;  Service: Orthopedics;  Laterality: Left;  150   Social History:  reports that she has quit smoking. Her smoking use included cigarettes. She has a 60 pack-year smoking history. She has never used smokeless tobacco. She reports that she does not currently use alcohol. She reports that she does not use drugs.  Family / Support Systems Marital Status: Single Patient Roles: Partner, Parent Spouse/Significant Other: Leota Jacobsen [phone] Children: Loressa-daughter  jemiceo-daughter [phone] Other Supports: Cephus Shelling  [phone] Anticipated Caregiver: [name] and family Ability/Limitations of Caregiver: [name] is retired and works odd jobs at times will not be there Medical laboratory scientific officer: 24/7 Family Dynamics: Close knit with children and extended family, she feels she has good supports. She has neighbors and friends who will check on them also  Social History Preferred language: English Religion: African [employer] Cultural Background: No issues Education: HS Health Literacy - How often do you need to have someone help you when you read instructions, pamphlets, or other written material from your doctor or pharmacy?: Never Writes: Yes Employment Status: Retired Marine scientist Issues: No issues Guardian/Conservator: None according to MD pt is capable of making her own decisions while here   Abuse/Neglect Abuse/Neglect Assessment Can Be Completed: Yes Physical Abuse: Denies Verbal Abuse: Denies Sexual Abuse: Denies Exploitation of patient/patient's resources: Denies Self-Neglect: Denies  Patient response to: Social Isolation - How often do you feel lonely or isolated from those around you?: Rarely  Emotional Status Pt's affect, behavior and adjustment status: Pt is motivated to begin her healing now she has had her leg amputated. The doctor tried to save her leg but it was not meant to be. She has been using a wheelchair due to the pain and inability to WB on her leg. So this is familar to her Recent Psychosocial Issues: other health issues-was getting IV antibitoics prior to admission and will need to continue according to her Psychiatric History: Hx-bipolar is on medications for this and she finds it helpful. She has quit her other vices smoking and alcohol Substance Abuse History: no issues  Patient / Family Perceptions, Expectations & Goals Pt/Family understanding of illness & functional limitations: Pt is motivated to begin healing and learning how to move without her leg.  She does talk with the MD and feels her questions and concerns are being addressed. Premorbid pt/family roles/activities: partner, mom, grandmother,retiree, neighbor, church member, sibling Anticipated changes in roles/activities/participation: resume Pt/family expectations/goals: Pt states: " I want to be able to get myself in the wheelchair by myself."  [employer]: Other (Comment) ([facility] active with) Premorbid Home Care/DME Agencies: Other (Comment) (has all DME except wheelchair) Transportation available at discharge: [name] and other family members Is the patient able to respond to transportation needs?: Yes In the past 12 months, has lack of transportation kept you from medical appointments or from getting medications?: No In the past 12 months, has lack of transportation kept you from meetings, work, or from getting things needed for daily living?: No Resource referrals recommended: Neuropsychology  Discharge Planning Living Arrangements: Spouse/significant other Support Systems: Spouse/significant other, Children, Other relatives, Friends/neighbors, Psychologist, clinical community Type of Residence: Private residence Insurance Resources: Media planner (specify) Investment banker, operational) Financial Resources: Tree surgeon, Family Support Financial Screen Referred: No Living Expenses: Own Money Management: Patient, Significant Other Does the patient have any problems obtaining your medications?: No Home Management: both Patient/Family Preliminary Plans: Return home to [name] who is retired vbut does work some odd jobs so may be times she  is there alone. Will await team's evaluations and work on discharge needs. Care Coordinator Anticipated Follow Up Needs: HH/OP  Clinical Impression Pleasant female who has been on CIR before 04/2021 and did well. She is hopeful she will do well again. She has good family supports along with her fiance-[name]. Will work on discharge  needs. Aware team conference on Wednesday.  Lucy Chris 11/16/2022, 11:50 AM

## 2022-11-16 NOTE — IPOC Note (Signed)
Overall Plan of Care [facility]) Patient Details Name: Tammy [name] MRN: [id number] DOB: 08-16-1955  Admitting Diagnosis: Left above-knee amputee [facility])  Hospital Problems: Principal Problem:   Left above-knee amputee [facility])     Functional Problem List: Nursing Bowel, Safety, Skin Integrity, Endurance, Medication Management, Pain  PT Balance, Edema, Endurance, Motor, Nutrition, Pain, Sensory, Skin Integrity  OT Balance, Edema, Endurance, Pain, Motor, Skin Integrity  SLP    TR         Basic ADL's: OT Grooming, Bathing, Dressing, Toileting     Advanced  ADL's: OT Simple Meal Preparation, Laundry, Light Housekeeping     Transfers: PT Bed Mobility, Bed to Chair, Car, Occupational psychologist, Research scientist (life sciences): PT Ambulation, Psychologist, prison and probation services, Stairs     Additional Impairments: OT None  SLP        TR      Anticipated Outcomes Item Anticipated Outcome  Self Feeding indep  Swallowing      Basic self-care  mod I  Toileting  mod I   Bathroom Transfers mod I  Bowel/Bladder  manage bowel w mod I assist  Transfers  Mod I with LRAD  Locomotion  Mod I with LRAD  Communication     Cognition     Pain  < 4 with prns  Safety/Judgment  manage w cues   Therapy Plan: PT Intensity: Minimum of 1-2 x/day ,45 to 90 minutes PT Frequency: 5 out of 7 days PT Duration Estimated Length of Stay: 10-12 days OT Intensity: Minimum of 1-2 x/day, 45 to 90 minutes OT Frequency: 5 out of 7 days OT Duration/Estimated Length of Stay: 10-12 days     Team Interventions: Nursing Interventions Patient/Family Education, Pain Management, Medication Management, Discharge Planning, Bowel Management, Disease Management/Prevention, Skin Care/Wound Management  PT interventions Ambulation/gait training, Discharge planning, Functional mobility training, Psychosocial support, Therapeutic Activities, Balance/vestibular training, Disease management/prevention, Neuromuscular  re-education, Skin care/wound management, Therapeutic Exercise, Wheelchair propulsion/positioning, DME/adaptive equipment instruction, Pain management, Splinting/orthotics, UE/LE Strength taining/ROM, Firefighter, Equities trader education, Museum/gallery curator, UE/LE Coordination activities, Functional electrical stimulation  OT Interventions Warden/ranger, Firefighter, Discharge planning, Disease mangement/prevention, Fish farm manager, Functional mobility training, Neuromuscular re-education, Pain management, Patient/family education, Psychosocial support, Self Care/advanced ADL retraining, Skin care/wound managment, Therapeutic Activities, Therapeutic Exercise, UE/LE Strength taining/ROM, Wheelchair propulsion/positioning  SLP Interventions    TR Interventions    SW/CM Interventions Discharge Planning, Psychosocial Support, Patient/Family Education   Barriers to Discharge MD  Medical stability  Nursing Decreased caregiver support, Home environment access/layout, Weight bearing restrictions 1 level 5 ste bil rail w S.O.; has DME  PT Inaccessible home environment, Home environment access/layout, Wound Care, Weight bearing restrictions, Other (comments) pain, 5 STE with 2 rails but can only reach 1  OT Inaccessible home environment, Home environment access/layout, IV antibiotics, Wound Care new L LE amputee with IV abx need, 5 ste  SLP      SW       Team Discharge Planning: Destination: PT-Home ,OT- Home , SLP-  Projected Follow-up: PT-Outpatient PT, OT-  None, SLP-  Projected Equipment Needs: PT-To be determined, OT- Wheelchair (measurements), Wheelchair cushion (measurements), SLP-  Equipment Details: PT-has RW, OT-has all DME except w/c Patient/family involved in discharge planning: PT- Patient,  OT-Patient, SLP-   MD ELOS: 7-10 Medical Rehab Prognosis:  Excellent Assessment: The patient has been admitted for CIR therapies with the  diagnosis of osteomyelitis of left tibia/femur, 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 The team will  be addressing functional mobility, strength, stamina, balance, safety, adaptive techniques and equipment, self-care, bowel and bladder mgt, patient and caregiver education. Goals have been set at Mod I w/c level. Anticipated discharge destination is home.        See Team Conference Notes for weekly updates to the plan of care

## 2022-11-17 DIAGNOSIS — D62 Acute posthemorrhagic anemia: Secondary | ICD-10-CM

## 2022-11-17 DIAGNOSIS — E876 Hypokalemia: Secondary | ICD-10-CM | POA: Diagnosis not present

## 2022-11-17 DIAGNOSIS — I10 Essential (primary) hypertension: Secondary | ICD-10-CM | POA: Diagnosis not present

## 2022-11-17 DIAGNOSIS — F419 Anxiety disorder, unspecified: Secondary | ICD-10-CM

## 2022-11-17 DIAGNOSIS — Z89612 Acquired absence of left leg above knee: Secondary | ICD-10-CM | POA: Diagnosis not present

## 2022-11-17 LAB — BASIC METABOLIC PANEL
Anion gap: 10 (ref 5–15)
BUN: 10 mg/dL (ref 8–23)
CO2: 26 mmol/L (ref 22–32)
Calcium: 9.2 mg/dL (ref 8.9–10.3)
Chloride: 103 mmol/L (ref 98–111)
Creatinine, Ser: 0.6 mg/dL (ref 0.44–1.00)
GFR, Estimated: >60 mL/min (ref >60)
Glucose, Bld: 96 mg/dL (ref 70–99)
Potassium: 3.6 mmol/L (ref 3.5–5.1)
Sodium: 139 mmol/L (ref 135–145)

## 2022-11-17 MED ORDER — HYDRALAZINE HCL 25 MG PO TABS
25.0000 mg | ORAL_TABLET | Freq: Three times a day (TID) | ORAL | Status: DC
  Administered 2022-11-17 – 2022-11-19 (×6): 25 mg via ORAL
  Filled 2022-11-17 (×6): qty 1

## 2022-11-17 NOTE — Progress Notes (Signed)
Physical Therapy Session Note  Patient Details  Name: Tammy [name] MRN: [id number] Date of Birth: [birth year]  Today's Date: 11/17/2022 PT Individual Time: 1350-1430 PT Individual Time Calculation (min): 40 min   Short Term Goals: Week 1:  PT Short Term Goal 1 (Week 1): pt will transfer bed<>chair with LRAD and supervision PT Short Term Goal 2 (Week 1): pt will transfer sit<>stand with LRAD and supervision PT Short Term Goal 3 (Week 1): pt will perform car transfer with LRAD and CGA  Skilled Therapeutic Interventions/Progress Updates: Patient sitting in WC on entrance to room. Pt reported no pain, only some feelings of residual limb sensations that it is still there. Pt without silicone guard on L residual limb on entrance and reported that [facility] rep is supposed to be dropping on off as the current one is too small due to swelling. Pt reported that OT was going to work with her on donning while being able to manage dressing. Pt performed therex in room while sitting in WC as indicated below: - Hip flexion in sitting with 5lb ankle weight donned on R LE until close to fatigue. - LAQ with 5lb ankle weight until close to fatigue - lateral taps with heel (adduction to abduction) with 5lb ankle weight with VC to increase height of heel - D1 flexion and extension with 2lb ball. PTA provided demonstrative cues on sequence and reasoning (in order to increase B shoulder stability) Pt provided with rest breaks throughout due to reports of soreness in R knee (pt reported it was tolerable, just sore).  Pt also noted to have decreased inversion/supination in R foot when in sitting. Pt reported having no arch in R foot. PTA encouraged pt to converse with [employer] rep in order to further assess increased WB tolerance and prosthetic ambulation (in the future).  Patient sitting in WC at end of session with brakes locked, chair alarm set, and all needs within reach.      Therapy  Documentation Precautions:  Precautions Precautions: Fall Restrictions Weight Bearing Restrictions: Yes LLE Weight Bearing: Non weight bearing  Therapy/Group: Individual Therapy  Kristel Durkee PTA 11/17/2022, 3:17 PM

## 2022-11-17 NOTE — Progress Notes (Signed)
Physical Therapy Session Note  Patient Details  Name: Tammy [name] MRN: [id number] Date of Birth: 11/29/1955  Today's Date: 11/17/2022 PT Individual Time: 1031-1129 PT Individual Time Calculation (min): 58 min   Short Term Goals: Week 1:  PT Short Term Goal 1 (Week 1): pt will transfer bed<>chair with LRAD and supervision PT Short Term Goal 2 (Week 1): pt will transfer sit<>stand with LRAD and supervision PT Short Term Goal 3 (Week 1): pt will perform car transfer with LRAD and CGA  Skilled Therapeutic Interventions/Progress Updates:      Pt seated in WC upon arrival. Pt agreeable to therapy. Pt denies any pain.   Education provided on energy conservation and limiting unnecessary pressure and shear forces on intact limb, with emphasis on ambulating only when necessary until pt receives prosthetic. Pt verbalized understanding and agreeable.   Treatment session focused on squat pivot transfer training. Pt performed squat pivot transfer hospital bed <> WC  x4 with CGA/close supervision with use of bed rail. Pt performed squat pivot transfer apartment bed <> WC with CGA. Pt performed squat pivot transfer WC to car simulator with CGA, verbal cues provided for UE positioning and technique.   Squat pivot transfer WC to shower chair on stairs with CGA with B UE support on grab bars, verbal cues provided for technique. Pt ascended/descended 4 6 inch steps with shower chair with light min A progressing to CGA, with verbal cues provided for unilateral UE support on shower chair and one on handrail for controlled descent. Pt demos improved confidence and less FOF.   Education provided of importance of performing skin inspection B with emphasis on checking L LE for infection and R LE for pressure ulcers. Education provided for recognizing signs and symptoms of infection. Pt able to provide teach back. Therapist provided demonstration of skin inspection with emphasis of inspecting all aspects of  B LE (anterior, medial, lateral, posterior, pt returned demonstration.   Discussed equipment needed for home. Pt needs WC. Pt has RW. Pt plans to order skin inspection mirror, bed rail for bed, grab bar for bathroom, and shower chair for stair naviagtion on [employer].   Pt requesting to use bathroom. Pt performed squat pivot transfer WC to BSC with B UE support on grab bars, with CGA, pt donned and doffed pants with supervision while standing with unilateral UE support on grab bar.    Pt seated in WC at end of session with all needs within reach and seatbelt alarm on.   Therapy Documentation Precautions:  Precautions Precautions: Fall Restrictions Weight Bearing Restrictions: Yes LLE Weight Bearing: Non weight bearing  Therapy/Group: Individual Therapy  [facility] [name], [place], DPT  11/17/2022, 7:36 AM

## 2022-11-17 NOTE — Progress Notes (Signed)
Occupational Therapy Session Note  Patient Details  Name: Tammy [name] MRN: [id number] Date of Birth: 09/25/1955  Today's Date: 11/18/2022 OT Individual Time: 1050-1155 OT Individual Time Calculation (min): 65 min    Short Term Goals: Week 1:  OT Short Term Goal 1 (Week 1): Pt will stand up to 3 min for sinkside tasks with close S OT Short Term Goal 2 (Week 1): Transfer to TTB with dist S OT Short Term Goal 3 (Week 1): LB dressing with dist s  Skilled Therapeutic Interventions/Progress Updates:  Pt received sitting in [facility] for skilled OT session with focus on discharge planning and UE strengthening. Pt agreeable to interventions, demonstrating overall pleasant mood. Pt with un-rated pain at residual limb, pre-medicated. OT offering intermediate rest breaks and positioning suggestions throughout session to address pain/fatigue and maximize participation/safety in session.   Pt and OT discuss current status of ramp and grab installation, reviewing current functioning and potential DC date. Pt self propels to all therapy locations with supervision. In day room, pt instructed in series of UE strengthening exercises:  -Overhead Ab/adduction -Chest Ab/adduction -Shoulder Flexion/Extension -Diagonal Ab/adduction  -Bicep curls -Punches   Pt completes 1 set/8 reps of each exercise with yellow theraband, requiring multi-modal cuing for correct form. HEP assigned.   Pt remained sitting in [facility] with all immediate needs met at end of session. Pt continues to be appropriate for skilled OT intervention to promote further functional independence.   Therapy Documentation Precautions:  Precautions Precautions: Fall Restrictions Weight Bearing Restrictions: Yes LLE Weight Bearing: Non weight bearing   Therapy/Group: Individual Therapy  Lou Cal, OTR/L, MSOT  11/18/2022, 11:04 AM

## 2022-11-17 NOTE — Progress Notes (Signed)
Seemed a little more down this evening. Complains of "not feeling right." Nothing specific when asked what didn't feel right. New shrinker in place to left AKA. PRN oxy ir 10mg 's given at 2007. Independent with female urinal. LBM 10/6. Alfredo Martinez A

## 2022-11-17 NOTE — Progress Notes (Signed)
Pharmacy Antibiotic + Anticoagulation Note  Tammy [name] is a 67 y.o. 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 S/P L AKA 9/25 though some of the intramedullary implant was unable to be resected. Pharmacy has been consulted for vancomycin dosing. Antibiotics planned through 12/15/22 per ID.  OPAT orders 11/10/22.   Vancomycin peak and trough levels at goal on 11/13/22 and same regimen continued. Renal function stable.  Lovenox 70 mg (~1 mg/kg) SQ Q12hrs begun 10/5 for L femoral DVT. Noted plan to transition to Eliquis prior to discharge.  Plan: Continue Vancomycin 750 mg IV q12hrs. Goal AUC 400-500. Weekly Vancomycin levels, next planned for 10/11. Also on Ceftriaxone 2gm IV q24hrs Antibiotics thru 12/15/22 per ID.   Continue Lovenox 70 mg SQ q12hrs. Follow up for transition to Eliquis. Bmet and CBC twice a week.  Height: 5\' 2"  (157.5 cm) Weight: 66.7 kg (147 lb 0.8 oz) IBW/kg (Calculated) : 50.1  Temp (24hrs), Avg:98.1 F (36.7 C), Min:97.9 F (36.6 C), Max:98.3 F (36.8 C)  Recent Labs  Lab 11/13/22 1026 11/13/22 2140 11/14/22 0413 11/16/22 0439 11/17/22 0408  WBC  --   --  5.5 4.7  --   CREATININE  --   --  0.52 0.56 0.60  VANCOTROUGH  --  8*  --   --   --   VANCOPEAK 24*  --   --   --   --     Estimated Creatinine Clearance: 60.2 mL/min (by C-G formula based on SCr of 0.6 mg/dL).    Allergies  Allergen Reactions   Lisinopril Cough    Antimicrobials this admission: Cefazolin 9/25 >> 9/25 Ceftriaxone 9/25 >>(11/5) Vancomycin 9/25 >> (11/5)  Dose adjustments this admission: 9/30: Vanc peak 35, Vanc trough 5, calculated AUC 379 on 1250 mg IV q24hrs > changed to 750 mg IV q12h  10/4: Vanc peak 24, Vanc trough 8, calculated AUC 400 on 750 mg IV q12h - continue  Microbiology results: 9/25 tissue L BKA, specimens A and B: negative 9/25 MRSA PCR: negative  Thank you for allowing pharmacy to be a part of this patient's  care.  Dennie Fetters, [place] 11/17/2022 7:32 AM

## 2022-11-17 NOTE — Progress Notes (Signed)
PROGRESS NOTE   Subjective/Complaints: Working with occupational therapy.  Patient has some discomfort when using shrinker device.  Oxycodone helping to keep pain overall under control.  Review of Systems  Constitutional:  Negative for fever.  Respiratory:  Negative for shortness of breath.   Cardiovascular:  Negative for chest pain.  Gastrointestinal:  Negative for abdominal pain, nausea and vomiting.  Genitourinary: Negative.   Musculoskeletal:  Positive for joint pain.  Skin:  Negative for rash.  Neurological:  Negative for headaches.  Psychiatric/Behavioral:  The patient is nervous/anxious.       Objective:   No results found. Recent Labs    11/15/22 0516 11/16/22 0439  WBC  --  4.7  HGB 7.7* 7.8*  HCT 24.7* 24.7*  PLT  --  357   Recent Labs    11/16/22 0439 11/17/22 0408  NA 140 139  K 3.2* 3.6  CL 101 103  CO2 26 26  GLUCOSE 89 96  BUN 10 10  CREATININE 0.56 0.60  CALCIUM 8.9 9.2    Intake/Output Summary (Last 24 hours) at 11/17/2022 1353 Last data filed at 11/17/2022 0809 Gross per 24 hour  Intake 910.65 ml  Output 950 ml  Net -39.35 ml        Physical Exam: Vital Signs Blood pressure (!) 168/75, pulse 62, temperature 98 F (36.7 C), resp. rate 18, height 5\' 2"  (1.575 m), weight 66.7 kg, SpO2 100%.  Constitutional: No distress . Vital signs reviewed. Sitting in bedside chair HEENT: NCAT, EOMI, oral membranes moist Neck: supple Cardiovascular: RRR without murmur. No JVD    Respiratory/Chest: CTA Bilaterally without wheezes or rales. Normal effort    GI/Abdomen: BS +, non-tender, non-distended Ext: no clubbing, cyanosis, LLE edema Psych: pleasant and cooperative  Skin: left AK incision clean appears to be healing well, staples intact fairly well approximated. Foam dressing over anterior abrasions on thigh. Neuro:  Alert and oriented x 3 Follows commands. . Normal insight and awareness.  Intact Memory. Normal language and speech. Cranial nerve exam unremarkable. MMT: UE 5/5. RLE 5/5. LLE limited by pain and 2-3/5 HF. Sensory exam normal for light touch and pain in all 4 limbs. No limb ataxia or cerebellar signs. No abnormal tone appreciated.    Musculoskeletal: left thigh swollen and somewhat tender      Assessment/Plan: 1. Functional deficits which require 3+ hours per day of interdisciplinary therapy in a comprehensive inpatient rehab setting. Physiatrist is providing close team supervision and 24 hour management of active medical problems listed below. Physiatrist and rehab team continue to assess barriers to discharge/monitor patient progress toward functional and medical goals  Care Tool:  Bathing    Body parts bathed by patient: Right arm, Left arm, Chest, Abdomen, Front perineal area, Buttocks, Right upper leg, Left upper leg, Right lower leg, Face     Body parts n/a: Left lower leg   Bathing assist Assist Level: Contact Guard/Touching assist     Upper Body Dressing/Undressing Upper body dressing   What is the patient wearing?: Pull over shirt    Upper body assist Assist Level: Set up assist    Lower Body Dressing/Undressing Lower body dressing  What is the patient wearing?: Underwear/pull up, Pants     Lower body assist Assist for lower body dressing: Contact Guard/Touching assist     Toileting Toileting    Toileting assist Assist for toileting: Contact Guard/Touching assist     Transfers Chair/bed transfer  Transfers assist     Chair/bed transfer assist level: Contact Guard/Touching assist     Locomotion Ambulation   Ambulation assist      Assist level: Contact Guard/Touching assist Assistive device: Walker-rolling Max distance: 15   Walk 10 feet activity   Assist     Assist level: Contact Guard/Touching assist Assistive device: Walker-rolling   Walk 50 feet activity   Assist Walk 50 feet with 2 turns activity  did not occur: Safety/medical concerns         Walk 150 feet activity   Assist Walk 150 feet activity did not occur: Safety/medical concerns         Walk 10 feet on uneven surface  activity   Assist Walk 10 feet on uneven surfaces activity did not occur: Safety/medical concerns (fatigue)         Wheelchair     Assist Is the patient using a wheelchair?: Yes Type of Wheelchair: Manual    Wheelchair assist level: Supervision/Verbal cueing Max wheelchair distance: 182ft    Wheelchair 50 feet with 2 turns activity    Assist        Assist Level: Supervision/Verbal cueing   Wheelchair 150 feet activity     Assist      Assist Level: Supervision/Verbal cueing   Blood pressure (!) 168/75, pulse 62, temperature 98 F (36.7 C), resp. rate 18, height 5\' 2"  (1.575 m), weight 66.7 kg, SpO2 100%.  Medical Problem List and Plan: 1. 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  Antithrombotics: -DVT/anticoagulation:  Pharmaceutical: therapeutic dose lovenox, appreciate pharmacy help -Dopplers with left femoral DVT -pt may gently massage limb for pain purposes -can convert to eliquis prior to discharge as this is covered by pt's insurance. Discussed DVT treatment plan with pt today             -antiplatelet therapy: none   3. Pain Management: Tylenol, oxycodone as needed             -increased gabapentin to 300 mg q HS (for phantom pain) with good results             -continue massage/tactile feedback (gentle for now), vision/mirror therapy  4. Mood/Behavior/Sleep: LCSW to evaluate and provide  emotional support             -history of bipolar: continue Wellbutrin SR 150 mg BID             -antipsychotic agents: n/a   -10/8 patient appears to have some anxiety and reports panic attack yesterday.  Continue Wellbutrin and consider additional medication if this does not improve. 5. Neuropsych/cognition: This patient is capable of making decisions on her own behalf.             -pt appears very positive and motivated  to regain functional mobility   6. Skin/Wound Care: Routine skin care checks             -local dressing to incision, change daily prn.              -foam dressing to abrasions on anterior thigh  -shrinker sock left AKA ordered 7. Fluids/Electrolytes/Nutrition: Routine Is and Os and follow-up chemistries             -continue Vit C, zinc supplements   -hypokalemia--kdur daily   -10/7 increase kdur to BID for K+ 3.2  -10/8 potassium up to 3.6, recheck Thursday ordered  -added protein supp for low albumin 8: Hypertension: monitor TID and prn             -continue amlodipine 10 mg daily             -continue hydralazine 25 mg BID   -10/8 Increase hydralazine to 25 mg 3 times daily    11/17/2022    3:13 AM 11/16/2022    8:06 PM 11/16/2022    2:46 PM  Vitals with BMI  Systolic 168 161 914  Diastolic 75 78 73  Pulse 62 71 70    9: Hyperlipidemia: continue statin   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 Renold Don    12: History of breast cancer on Arimadex 1 mg daily    14: Anemia: acute blood loss atop anemia chronic disease             -CBC with significant drop to 7.6 10/5-->f/u 10/6 stable at 7.7  -continue iron supplement  -check stool for ob x1 although unlikely  -10/7 HGB stable at  7.8  Recheck labs tomorrow ordered   15: GERD: continue PPI      LOS: 4 days A FACE TO FACE EVALUATION WAS PERFORMED  Fanny Dance 11/17/2022, 1:53 PM

## 2022-11-18 DIAGNOSIS — F419 Anxiety disorder, unspecified: Secondary | ICD-10-CM | POA: Diagnosis not present

## 2022-11-18 DIAGNOSIS — F3175 Bipolar disorder, in partial remission, most recent episode depressed: Secondary | ICD-10-CM

## 2022-11-18 DIAGNOSIS — E876 Hypokalemia: Secondary | ICD-10-CM | POA: Diagnosis not present

## 2022-11-18 DIAGNOSIS — I10 Essential (primary) hypertension: Secondary | ICD-10-CM | POA: Diagnosis not present

## 2022-11-18 DIAGNOSIS — Z89612 Acquired absence of left leg above knee: Secondary | ICD-10-CM | POA: Diagnosis not present

## 2022-11-18 NOTE — Progress Notes (Signed)
Occupational Therapy Discharge Summary  Patient Details  Name: Ellsie [name] MRN: [id number] Date of Birth: April 25, 1955  Date of Discharge from OT service:November 22, 2022  Today's Date: 11/22/2022 OT Individual Time: 1300-1353 OT Individual Time Calculation (min): 53 min  Skilled Therapeutic Intervention: Patient received seated in wheelchair.  Patient bathed and dressed and reports completing on her own. Patient now able to don shrinker, and is working to tolerate shrinker - called for Tylenol to reduce pain in residual limb.  Reviewed skin care/ skin checks/ inspection mirror, gave resource relating to amputee support group, wheelchair gloves, and discussed psychsocial issues relating to amputation.  Patient very honest about her emotions relating to amputation, and eager to return home with fiance.  Patient transferred to toilet and completed all aspects pf toileting with modified independence.  Left up in wheelchair with personal items in reach.    Patient has met 11 of 11 long term goals due to improved activity tolerance, improved balance, postural control, ability to compensate for deficits, and functional use of  RIGHT lower extremity.  Patient to discharge at overall Modified Independent level.  Patient's care partner is independent to provide the necessary physical assistance at discharge.    Reasons goals not met: NA  Recommendation:  No OT follow-up recommended at this time.   Equipment: Reacher  Reasons for discharge: treatment goals met and discharge from hospital  Patient/family agrees with progress made and goals achieved: Yes  OT Discharge Precautions/Restrictions  Precautions Precautions: Fall Precaution Comments: L AKA Required Braces or Orthoses: Other Brace Other Brace: L Shinker Restrictions Weight Bearing Restrictions: Yes LLE Weight Bearing: Non weight bearing Pain  6/10 residual limb ADL ADL Equipment Provided: Reacher Eating:  Independent Where Assessed-Eating: Chair, Wheelchair Grooming: Modified independent Where Assessed-Grooming: Sitting at sink Upper Body Bathing: Modified independent Where Assessed-Upper Body Bathing: Shower Lower Body Bathing: Modified independent Where Assessed-Lower Body Bathing: Shower Upper Body Dressing: Independent Where Assessed-Upper Body Dressing: Wheelchair Lower Body Dressing: Modified independent Where Assessed-Lower Body Dressing: Wheelchair Toileting: Modified independent Where Assessed-Toileting: Toilet, Hotel manager: Modified independent Statistician Method: Squat pivot, Stand pivot Acupuncturist: Bedside commode, Grab bars Tub/Shower Transfer: Modified independent Web designer Method: Stand pivot, Squat pivot Tub/Shower Equipment: Emergency planning/management officer, Counselling psychologist Method: Warden/ranger: Emergency planning/management officer, Grab bars ADL Comments: set up sinkside UB seated, standing for hygiene and LB mngt with min A, min A grab bar or RW transfer Vision Baseline Vision/History: 0 No visual deficits Patient Visual Report: No change from baseline Vision Assessment?: No apparent visual deficits Perception  Perception: Within Functional Limits Praxis Praxis: WFL Cognition Cognition Overall Cognitive Status: Within Functional Limits for tasks assessed Arousal/Alertness: Awake/alert Orientation Level: Person;Place;Situation Person: Oriented Place: Oriented Situation: Oriented Memory: Appears intact Awareness: Appears intact Problem Solving: Appears intact Safety/Judgment: Appears intact Brief Interview for Mental Status (BIMS) Repetition of Three Words (First Attempt): 3 Temporal Orientation: Year: Correct Temporal Orientation: Month: Accurate within 5 days Temporal Orientation: Day: Correct Recall: "Sock": Yes, no cue required Recall: "Blue":  Yes, no cue required Recall: "Bed": Yes, no cue required BIMS Summary Score: 15 Sensation Sensation Light Touch: Impaired Detail Peripheral sensation comments: Increased sensitivy to touch at L residual limb site. Light Touch Impaired Details: Impaired LLE Hot/Cold: Appears Intact Proprioception: Appears Intact Stereognosis: Appears Intact Coordination Gross Motor Movements are Fluid and Coordinated: No Fine Motor Movements are Fluid and Coordinated: Yes Coordination and Movement  Description: Altered balance strategies due to L AKA. Motor  Motor Motor: Within Functional Limits Mobility  Transfers Sit to Stand: Independent with assistive device Stand to Sit: Independent with assistive device  Trunk/Postural Assessment  Cervical Assessment Cervical Assessment: Within Functional Limits Thoracic Assessment Thoracic Assessment: Within Functional Limits Lumbar Assessment Lumbar Assessment: Within Functional Limits Postural Control Postural Control: Within Functional Limits  Balance Balance Balance Assessed: Yes Static Sitting Balance Static Sitting - Balance Support: Feet supported Static Sitting - Level of Assistance: 7: Independent Dynamic Sitting Balance Dynamic Sitting - Balance Support: During functional activity Dynamic Sitting - Level of Assistance: 7: Independent Dynamic Sitting - Balance Activities: Lateral lean/weight shifting;Forward lean/weight shifting;Reaching for objects Static Standing Balance Static Standing - Balance Support: Bilateral upper extremity supported;During functional activity Static Standing - Level of Assistance: 6: Modified independent (Device/Increase time) Dynamic Standing Balance Dynamic Standing - Balance Support: During functional activity Dynamic Standing - Level of Assistance: 6: Modified independent (Device/Increase time) Dynamic Standing - Balance Activities: Lateral lean/weight shifting;Forward lean/weight shifting;Reaching for  objects Extremity/Trunk Assessment RUE Assessment RUE Assessment: Exceptions to [facility] Active Range of Motion (AROM) Comments: Decreased due to acute shoulder pain, possible discomfort due to overuse. General Strength Comments: WFL LUE Assessment LUE Assessment: Within Functional Limits  Lou Cal, OTR/L, MSOT  11/18/2022, 9:31 PM

## 2022-11-18 NOTE — Progress Notes (Addendum)
PROGRESS NOTE   Subjective/Complaints: No new concerns today. She is using ace wrap and sock as shrinker was too painful.  New shrinker has been ordered.  Pain is under control with current medications.  She asked about her potassium and hemoglobin levels and we reviewed these.  Last BM last night   Review of Systems  Constitutional:  Negative for fever.  HENT:  Negative for congestion.   Respiratory:  Negative for shortness of breath.   Cardiovascular:  Negative for chest pain.  Gastrointestinal:  Negative for abdominal pain, constipation, diarrhea, nausea and vomiting.  Genitourinary: Negative.   Musculoskeletal:  Positive for joint pain.  Skin:  Negative for rash.  Neurological:  Negative for headaches.  Psychiatric/Behavioral:  The patient is nervous/anxious.       Objective:   No results found. Recent Labs    11/16/22 0439  WBC 4.7  HGB 7.8*  HCT 24.7*  PLT 357   Recent Labs    11/16/22 0439 11/17/22 0408  NA 140 139  K 3.2* 3.6  CL 101 103  CO2 26 26  GLUCOSE 89 96  BUN 10 10  CREATININE 0.56 0.60  CALCIUM 8.9 9.2    Intake/Output Summary (Last 24 hours) at 11/18/2022 1033 Last data filed at 11/18/2022 0818 Gross per 24 hour  Intake 240 ml  Output 100 ml  Net 140 ml        Physical Exam: Vital Signs Blood pressure (!) 153/72, pulse 65, temperature 98.4 F (36.9 C), temperature source Oral, resp. rate 18, height 5\' 2"  (1.575 m), weight 66.7 kg, SpO2 100%.  Constitutional: No distress . Vital signs reviewed.  Sitting in wheelchair working with therapy HEENT: NCAT, EOMI, oral membranes moist Neck: supple Cardiovascular: RRR without murmur. No JVD    Respiratory/Chest: CTA Bilaterally without wheezes or rales. Normal effort    GI/Abdomen: BS +, non-tender, non-distended Ext: no clubbing, cyanosis, LLE edema Psych: pleasant and cooperative  Skin: left AK with ace wrap and sock in place.   Foam dressing over anterior abrasions on thigh. Neuro:  Alert and oriented x 3 Follows commands. . Normal insight and awareness. Intact Memory. Normal language and speech. Cranial nerve exam unremarkable. MMT: UE 5/5. RLE 5/5. LLE limited by pain and 2-3/5 HF. Sensory exam normal for light touch and pain in all 4 limbs. No limb ataxia or cerebellar signs. No abnormal tone appreciated.    Musculoskeletal: left thigh swollen and somewhat tender      Assessment/Plan: 1. Functional deficits which require 3+ hours per day of interdisciplinary therapy in a comprehensive inpatient rehab setting. Physiatrist is providing close team supervision and 24 hour management of active medical problems listed below. Physiatrist and rehab team continue to assess barriers to discharge/monitor patient progress toward functional and medical goals  Care Tool:  Bathing    Body parts bathed by patient: Right arm, Left arm, Chest, Abdomen, Front perineal area, Buttocks, Right upper leg, Left upper leg, Right lower leg, Face     Body parts n/a: Left lower leg   Bathing assist Assist Level: Contact Guard/Touching assist     Upper Body Dressing/Undressing Upper body dressing   What is the  patient wearing?: Pull over shirt    Upper body assist Assist Level: Set up assist    Lower Body Dressing/Undressing Lower body dressing      What is the patient wearing?: Underwear/pull up, Pants     Lower body assist Assist for lower body dressing: Contact Guard/Touching assist     Toileting Toileting    Toileting assist Assist for toileting: Contact Guard/Touching assist     Transfers Chair/bed transfer  Transfers assist     Chair/bed transfer assist level: Contact Guard/Touching assist     Locomotion Ambulation   Ambulation assist      Assist level: Contact Guard/Touching assist Assistive device: Walker-rolling Max distance: 15   Walk 10 feet activity   Assist     Assist level:  Contact Guard/Touching assist Assistive device: Walker-rolling   Walk 50 feet activity   Assist Walk 50 feet with 2 turns activity did not occur: Safety/medical concerns         Walk 150 feet activity   Assist Walk 150 feet activity did not occur: Safety/medical concerns         Walk 10 feet on uneven surface  activity   Assist Walk 10 feet on uneven surfaces activity did not occur: Safety/medical concerns (fatigue)         Wheelchair     Assist Is the patient using a wheelchair?: Yes Type of Wheelchair: Manual    Wheelchair assist level: Supervision/Verbal cueing Max wheelchair distance: 146ft    Wheelchair 50 feet with 2 turns activity    Assist        Assist Level: Supervision/Verbal cueing   Wheelchair 150 feet activity     Assist      Assist Level: Supervision/Verbal cueing   Blood pressure (!) 153/72, pulse 65, temperature 98.4 F (36.9 C), temperature source Oral, resp. rate 18, height 5\' 2"  (1.575 m), weight 66.7 kg, SpO2 100%.  Medical Problem List and Plan: 1. 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 Formulized individual treatment plan based on medical history, underlying problem and comorbidities.   -She is having a ramp built 2.  Antithrombotics: -DVT/anticoagulation:  Pharmaceutical: therapeutic dose lovenox, appreciate pharmacy help -Dopplers with left femoral DVT -pt may gently massage limb for pain purposes -can  convert to eliquis prior to discharge as this is covered by pt's insurance. Discussed DVT treatment plan with pt today             -antiplatelet therapy: none   3. Pain Management: Tylenol, oxycodone as needed             -increased gabapentin to 300 mg q HS (for phantom pain) with good results             -continue massage/tactile feedback (gentle for now), vision/mirror therapy  4. Mood/Behavior/Sleep: LCSW to evaluate and provide emotional support             -history of bipolar: continue Wellbutrin  SR 150 mg BID             -antipsychotic agents: n/a   -10/9 mood appears stable, continue current dose of Wellbutrin 5. Neuropsych/cognition: This patient is capable of making decisions on her own behalf.             -pt appears very positive and motivated to regain functional mobility   6. Skin/Wound Care: Routine skin care checks             -local dressing to incision, change daily prn.              -foam dressing to abrasions on anterior thigh  -shrinker sock left AKA ordered 7. Fluids/Electrolytes/Nutrition: Routine Is and Os and follow-up chemistries             -continue Vit C, zinc supplements   -hypokalemia--kdur daily   -10/7 increase kdur to BID for K+ 3.2  -10/8 potassium up to 3.6  -Recheck potassium tomorrow  -added protein supp for low albumin 8: Hypertension: monitor TID and prn             -continue amlodipine 10 mg daily             -continue hydralazine 25 mg BID   -10/8 Increase hydralazine to 25 mg 3 times daily  -Monitor response to medication change    11/18/2022    5:02 AM 11/17/2022    9:00 PM 11/17/2022    4:04 PM  Vitals with BMI  Systolic 153 140 161  Diastolic 72 76 66  Pulse 65 62 62    9: Hyperlipidemia: continue statin   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 Renold Don    12: History of breast cancer on  Arimadex 1 mg daily    14: Anemia: acute blood loss atop anemia chronic disease             -CBC with significant drop to 7.6 10/5-->f/u 10/6 stable at 7.7  -continue iron supplement  -check stool for ob x1 although unlikely  -10/7 HGB stable at  7.8  -Reviewed HGB with pt, recheck tomorrow am   15: GERD: continue PPI      LOS: 5 days A FACE TO FACE EVALUATION WAS PERFORMED  Fanny Dance 11/18/2022, 10:33 AM

## 2022-11-18 NOTE — Progress Notes (Signed)
Patient ID: Tammy [name], female   DOB: 06/25/1955, 67 y.o.   MRN: [id number]  Met with pt to give team conference update regarding goals of mod/I wheelchair level and discharge 10/14. Her fiance is building a ramp should be completed by discharge date. Only need is wheelchair has other pieces of equipment from past admissions. Would like a refresher on IV antibiotics have reached out to [facility] and kelly-liaison for [facility] the home health agency active with her. Will make referral to [facility] for wheelchair and work toward discharge needs.

## 2022-11-18 NOTE — Consult Note (Signed)
Neuropsychological Consultation Comprehensive Inpatient Rehab   Patient:   Tammy [name]   DOB:   1955/06/11  MR Number:  [id number]  Location:  [facility] [address] CENTER A [address] [place] [address] Dept: [phone] Loc: [phone]           Date of Service:   11/18/2022  Start Time:   1 PM End Time:   2 PM  Provider/Observer:  Arley Phenix, Psy.D.       Clinical Neuropsychologist       Billing Code/Service: [phone]  Reason for Service:    Tammy [name] is a 67 year old female referred for neuropsychological consultation due to coping and adjustment issues with recent AKA and currently admitted onto the comprehensive inpatient rehabilitation unit.  Recently, the patient had undergone open reduction of her distal femur fracture on April 10, 2021.  Patient had hardware removal on March 2024 and multiple irrigation and debridements over time.  Patient continued to have purulent drainage and the wound VAC and wound was not healing.  Skin grafts had also failed.  The hardware remained exposed.  Patient underwent left AKA per Dr. Due to on 11/04/2022 with wound VAC removed on 10/2.  Patient has been followed for potential reoccurrence of infection with amputation site wound cultures remaining negative.  Patient admitted on the comprehensive inpatient rehabilitation to adjust for acute changes from AKA.  During today's visit the patient was awake and alert and oriented.  Patient acknowledged past history of recurrent depressive types of events and notes that she did have significant episode of depression during aspects of her dealing with her progressively complicated medical issues with her leg.  Patient reports that she had gotten to the point that she felt that she should just go to a skilled nursing home and stopped attempting to salvage her leg or do any interventions.  Patient was aware that this  passive response was essentially a giving out type of response.  Patient reports that her mood has been improved and continues to improve as her medical status is stabilized.  She continues to have concerns about how she will manage with her amputation and how she will be perceived by others around body image concerns.  Patient also noted that she is continuing with phantom limb syndrome type experiences that are disconcerting to her at times and including dreams involving her leg.  We worked on coping and adjustment issues around these factors.  HPI for the current admission:    HPI: Tammy [name] is a 67 year old female who underwent open reduction of her distal femur fracture April 10, 2021. Patient eventually had the hardware removed in March 2024 with irrigation and debridement with Dr. [name] 3 separate times as well as distal femur replacement with total knee arthroplasty. Patient has undergone 2 wound debridements and application of myriad with Dr. Ulice Bold on May 6 and August 5. She continued to have purulent drainage in the wound vac; and the wound did not heal with significant pain in the infected area. Skin graft trial previously from plastic surgery failed as well. The hardware remained exposed. She underwent left AKA by Dr. [name] on 11/04/2022. Wound VAC removed 10/02. The amputation site wound cultures remain negative. She will continue on vancomycin and ceftriaxone through 11/05 and follow with ID. Tolerating diet. She continues to progress ambulation in the hallway with RW CGA/Min A. The patient requires inpatient medicine and rehabilitation evaluations and services for  ongoing dysfunction secondary to left AKA.   Medical History:   Past Medical History:  Diagnosis Date   Anemia    hx of   Anxiety    on meds   Arthritis    back/bilateral ankles   Bipolar disorder (HCC)    Breast cancer (HCC) 2022   LEFT breast-radiation   Depression    on meds   Family history of breast  cancer 07/10/2020   Family history of colon cancer 07/10/2020   Family history of ovarian cancer 07/10/2020   GERD (gastroesophageal reflux disease)    on meds   Heart murmur    dx by PCP   Hepatitis C    HTN (hypertension)    on meds   Hx of radiation therapy    Liver cirrhosis (HCC)    from hep c   Neuromuscular disorder (HCC)    Osteopenia    Personal history of radiation therapy    Stroke (HCC) 09/2012   slurred speech and LEFT sided weakness/notes 10/04/2012         Patient Active Problem List   Diagnosis Date Noted   Bipolar 1 disorder, depressed, partial remission (HCC) 11/18/2022   Left above-knee amputee (HCC) 11/13/2022   Infection of total knee replacement (HCC) 11/04/2022   S/P AKA (above knee amputation) unilateral, left (HCC) 11/04/2022   Chronic hepatitis C with cirrhosis (HCC) 06/06/2022   Surgical wound dehiscence, initial encounter 06/04/2022   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, left, with nonunion, subsequent encounter 04/30/2022   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 04/30/2022   Hypokalemia 04/16/2021   Hip fracture requiring operative repair (HCC) 04/16/2021   Vitamin D deficiency 04/12/2021   Hyponatremia 04/11/2021   Acute postoperative anemia due to expected blood loss 04/11/2021   Class 1 obesity 04/11/2021   Left comminuted and displaced fracture of the distal femur metaphysis 04/08/2021   History of CVA (cerebrovascular accident) 04/08/2021   Genetic testing 07/17/2020   Family history of breast cancer 07/10/2020   Family history of ovarian cancer 07/10/2020   Family history of colon cancer 07/10/2020   Malignant neoplasm of upper-outer quadrant of left breast in female, estrogen receptor positive (HCC) 07/09/2020   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   Chronic pain syndrome 10/06/2012   Nicotine dependence 10/06/2012   Anxiety state 10/05/2012   Bipolar  disorder, unspecified (HCC) 10/05/2012   Slurred speech 10/04/2012   Left-sided weakness 10/04/2012   Hyperkalemia 10/04/2012   Acute CVA (cerebrovascular accident) (HCC) 10/04/2012   Hypertension     Behavioral Observation/Mental Status:   Younique My  presents as a 67 y.o.-year-old Right handed African American Female who appeared her stated age. her dress was Appropriate and she was Well Groomed and her manners were Appropriate to the situation.  her participation was indicative of Appropriate and Attentive behaviors.  There were physical disabilities noted.  she displayed an appropriate level of cooperation and motivation.    Interactions:    Active Appropriate  Attention:   within normal limits and attention span and concentration were age appropriate  Memory:   within normal limits; recent and remote memory intact  Visuo-spatial:   not examined  Speech (Volume):  normal  Speech:   normal; normal  Thought Process:  Coherent and Relevant  Coherent, Directed, and Logical  Though Content:  WNL; not suicidal and not homicidal  Orientation:   person, place, time/date, and situation  Judgment:   Good  Planning:   Fair  Affect:    Appropriate  Mood:    Dysphoric  Insight:   Good  Intelligence:   normal  Psychiatric History:  Patient with past psychiatric history including previous diagnosis of mood disorder and episodes of depressive symptoms in the past.  Most recent significant depressive events were associated with complications with her leg and concerns around what her life will be like post amputation.  Family Med/Psych History:  Family History  Problem Relation Age of Onset   Breast cancer Mother    Other Mother        blood clots   Diabetes Sister    Neuropathy Sister        diabetic neuropathy   Breast cancer Maternal Aunt        69   Colon cancer Maternal Aunt 35   Diabetes Paternal Aunt    Diabetes Brother    Neuropathy Brother         diabetic neuropathy   Breast cancer Other        PGM's sister; dx mid 63s   Colon cancer Other        PGM's brother; dx 52s   Ovarian cancer Other        PGM's mother; dx unknown age   Esophageal cancer Neg Hx    Rectal cancer Neg Hx    Stomach cancer Neg Hx    Colon polyps Neg Hx     Impression/DX:   Karesa Gyanna Dipierro is a 67 year old female referred for neuropsychological consultation due to coping and adjustment issues with recent AKA and currently admitted onto the comprehensive inpatient rehabilitation unit.  Recently, the patient had undergone open reduction of her distal femur fracture on April 10, 2021.  Patient had hardware removal on March 2024 and multiple irrigation and debridements over time.  Patient continued to have purulent drainage and the wound VAC and wound was not healing.  Skin grafts had also failed.  The hardware remained exposed.  Patient underwent left AKA per Dr. Due to on 11/04/2022 with wound VAC removed on 10/2.  Patient has been followed for potential reoccurrence of infection with amputation site wound cultures remaining negative.  Patient admitted on the comprehensive inpatient rehabilitation to adjust for acute changes from AKA.  During today's visit the patient was awake and alert and oriented.  Patient acknowledged past history of recurrent depressive types of events and notes that she did have significant episode of depression during aspects of her dealing with her progressively complicated medical issues with her leg.  Patient reports that she had gotten to the point that she felt that she should just go to a skilled nursing home and stopped attempting to salvage her leg or do any interventions.  Patient was aware that this passive response was essentially a giving out type of response.  Patient reports that her mood has been improved and continues to improve as her medical status is stabilized.  She continues to have concerns about how she will manage with  her amputation and how she will be perceived by others around body image concerns.  Patient also noted that she is continuing with phantom limb syndrome type experiences that are disconcerting to her at times and including dreams involving her leg.  We worked on coping and adjustment issues around these factors.   Diagnosis:    History of anxiety and mood disturbance with stable mood state currently.         Electronically Signed  _______________________ Arley Phenix, Psy.D. Clinical Neuropsychologist

## 2022-11-18 NOTE — Progress Notes (Signed)
Occupational Therapy Session Note  Patient Details  Name: Bellatrix Devonshire MRN: [id number] Date of Birth: [birth year]  Today's Date: 11/18/2022 OT Individual Time: 1430-1530 OT Individual Time Calculation (min): 60 min    Short Term Goals: Week 1:  OT Short Term Goal 1 (Week 1): Pt will stand up to 3 min for sinkside tasks with close S OT Short Term Goal 2 (Week 1): Transfer to TTB with dist S OT Short Term Goal 3 (Week 1): LB dressing with dist s  Skilled Therapeutic Interventions/Progress Updates:   Pt seen for skilled OT session. Pt agreeable to all therapy with need for toileting and then focus on community skills via w/c and transfers as well as safe standing supported for pressure reliefs. Pt somewhat reserved and guarded about others seeing her in the lobby or outdoors. OT provided support and encouragement as well as discussion on re-framing thoughts surrounding perceptions and judgements. Pt reported frustration on current shrinker and ACE wrap and OT reinforced that with each stage of recovery garments will lessen and L residual limb management will become easier and more routine. Pt returned back to bed after session and reported being pleased with outing. All safety measures and needs with bed exit left in place.   Pain: denied throughout session  Therapy Documentation Precautions:  Precautions Precautions: Fall Restrictions Weight Bearing Restrictions: Yes LLE Weight Bearing: Non weight bearing  Therapy/Group: Individual Therapy  Vicenta Dunning 11/18/2022, 7:43 AM

## 2022-11-18 NOTE — Progress Notes (Signed)
Physical Therapy Session Note  Patient Details  Name: Tammy [name] MRN: [id number] Date of Birth: February 19, 1955  Today's Date: 11/18/2022 PT Individual Time: 0902-1015 PT Individual Time Calculation (min): 73 min   Short Term Goals: Week 1:  PT Short Term Goal 1 (Week 1): pt will transfer bed<>chair with LRAD and supervision PT Short Term Goal 2 (Week 1): pt will transfer sit<>stand with LRAD and supervision PT Short Term Goal 3 (Week 1): pt will perform car transfer with LRAD and CGA  Skilled Therapeutic Interventions/Progress Updates:      Pt reports 6/10 L LE pain, Pt reports soreness in R leg with stair navigation. Pt provided rest and repositioning throughout session. Therapist managing IV throughout session.   Pt requesting to use Bathroom, self propelled WC into bathroom, and positioined WC with supervision, verbal cues provided for correct positioning, sqaut pivot transfer WC to [facility] with use of grab bars, pt continent of bladder. Pt donned/doffed pants and performed pericare while standing with supervision/CGA.   Squat pivot transfer WC to bed with CGA. Donned ace bandage to L residual limb with pt performed glute bridge x3. Pt donned white sock over ace bandage with supervision, verbal cues provided to pull up to reduce wrinkles. Scott from [employer]anger present, Acupuncturist ordered [employer] with silicone band that is shorter to reduce bunching on pt as previous grey shrinker and black shrinker did not fit appropriately.  Squat pivot trasnfer WC to mat table, with supervision x4, verbal cues provided for technique.   Pt ascended/descended 4 steps with L ascending handrail and CGA for ascending/min-mod A for descending 2/2 R LE buckling with fatigue. Pt reports fiance is hoping to have ramp complete by discharge on 10/14. Scheduled family training for Friday at 1:00 pm.   Pt seated in [facility] at end of session with all needs within reach and seatbelt alarm on.       Therapy  Documentation Precautions:  Precautions Precautions: Fall Restrictions Weight Bearing Restrictions: Yes LLE Weight Bearing: Non weight bearing Therapy/Group: Individual Therapy  [facility] [name], [place], DPT  11/18/2022, 7:41 AM

## 2022-11-18 NOTE — Patient Care Conference (Signed)
Inpatient RehabilitationTeam Conference and Plan of Care Update Date: 11/18/2022   Time: 12:18 PM    Patient Name: Tammy [name]      Medical Record Number: [id number]  Date of Birth: September 05, 1955 Sex: Female         Room/Bed: 4W24C/4W24C-01 Payor Info: Payor: [employer] MEDICARE / Plan: [facility] MEDICARE / Product Type: *No Product type* /    Admit Date/Time:  11/13/2022  5:48 PM  Primary Diagnosis:  Left above-knee amputee [facility])  Hospital Problems: Principal Problem:   Left above-knee amputee [facility]) Active Problems:   Bipolar 1 disorder, depressed, partial remission [facility])    Expected Discharge Date: Expected Discharge Date: 11/23/22  Team Members Present: Physician leading conference: Dr. Fanny Dance Social Worker Present: Dossie Der, LCSW Nurse Present: Chana Bode, RN PT Present: [name], PT OT Present: Lou Cal, OT PPS Coordinator present : Fae Pippin, SLP     Current Status/Progress Goal Weekly Team Focus  Bowel/Bladder   Pt is continent of bowel/bladder   Pt will remain continent of bowel/bladder   Will assist with toileting needs    Swallow/Nutrition/ Hydration               ADL's   Setup/supervision for UB ADLs, CGA for LB ADLs, CGA-SUP for squat-pivot transfers to access BSC/TTB (previsously performing this technique), Max A for donning shinker due to sensitivity.   Mod I; has all DME   Simple IADLs and discharge planning    Mobility   bed mobility supervision/mod I, rolling to L with min A 2/2 pain, sit to stand with CGA/sup, stand pivot transfer with RW and CGA/sup, squat pivot transfer with CGA/sup, stair navigation x4 6 inch steps with shower chair technique and CGA, with therapist managing shower chair, car trasnfer with CGA/sup. Gait x 15 feet with RW and CGA.   mod I  D/C 10/14, OPPT, recommended equipment: WC 18x18 , pt has RW, pt purchasing skin inspection mirror, reacher, and shower chair, grab bars, and bed  rail on [place], progress dynamic standing balance and independence with bed to chair transfer, Pt having difficulty tolerating BKA shrinker grey and black, currently wearing Ace bandage with white sock. Lorin Picket has ordered black shrinker that is shorter. Family training scheudled for Friday at 1    Communication                Safety/Cognition/ Behavioral Observations               Pain   Pt c/o of pain in left AKA   Pt's pain is controlled with medication   Will assess qshift and PRN    Skin   Pt has incision to left AKA with staples   Pt's AKA will heal  Will assess qshift and PRN      Discharge Planning:  Home with [name]-fiance who is retired but does do odd jobs at times will be alone. Pt was managing prior to admission due to pain in leg and unable to use   Team Discussion: Patient DVT post left AKA. Limited by pain and right knee buckling along with fatigue.  Patient on target to meet rehab goals: yes, currently needs set u for upper body care and CGA for lower ody care. Needs CGA for ambulation up to 15' using a RW. Needs CGA with a shower chair for steps.   *See Care Plan and progress notes for long and short-term goals.   Revisions to Treatment Plan:  Exchange shrinker due to  poor fit   Teaching Needs: Safety, medications, skin care/incision care, transfers, toileting, etc. Stop-the-Hop  Current Barriers to Discharge: Home enviroment access/layout and IV antibiotics through 12/15/22  Possible Resolutions to Barriers: Ramp for entry to home Family education 11/20/22 HH follow up services DME: W/C, bed-rail, slide-board     Medical Summary Current Status: AKA, hypokalemia, HTN, anemia, IV abx  Barriers to Discharge: Infection/IV Antibiotics;Medical stability;Electrolyte abnormality;Uncontrolled Pain  Barriers to Discharge Comments: AKA, hypokalemia, HTN, anemia, IV abx, residual limb pain, DVT Possible Resolutions to [employer] Focus: new shrinker  ordered, monitor CBC/BMP, adjust BP meds as needed, anticoagulation , Ramp being made at home   Continued Need for Acute Rehabilitation Level of Care: The patient requires daily medical management by a physician with specialized training in physical medicine and rehabilitation for the following reasons: Direction of a multidisciplinary physical rehabilitation program to maximize functional independence : Yes Medical management of patient stability for increased activity during participation in an intensive rehabilitation regime.: Yes Analysis of laboratory values and/or radiology reports with any subsequent need for medication adjustment and/or medical intervention. : Yes   I attest that I was present, lead the team conference, and concur with the assessment and plan of the team.   Chana Bode B 11/18/2022, 9:18 PM

## 2022-11-19 DIAGNOSIS — G548 Other nerve root and plexus disorders: Secondary | ICD-10-CM

## 2022-11-19 DIAGNOSIS — R52 Pain, unspecified: Secondary | ICD-10-CM

## 2022-11-19 LAB — CBC
HCT: 26.1 % — ABNORMAL LOW (ref 36.0–46.0)
Hemoglobin: 8.1 g/dL — ABNORMAL LOW (ref 12.0–15.0)
MCH: 27.4 pg (ref 26.0–34.0)
MCHC: 31 g/dL (ref 30.0–36.0)
MCV: 88.2 fL (ref 80.0–100.0)
Platelets: 358 10*3/uL (ref 150–400)
RBC: 2.96 MIL/uL — ABNORMAL LOW (ref 3.87–5.11)
RDW: 15.8 % — ABNORMAL HIGH (ref 11.5–15.5)
WBC: 5 10*3/uL (ref 4.0–10.5)
nRBC: 0 % (ref 0.0–0.2)

## 2022-11-19 LAB — BASIC METABOLIC PANEL
Anion gap: 9 (ref 5–15)
BUN: 11 mg/dL (ref 8–23)
CO2: 26 mmol/L (ref 22–32)
Calcium: 9.4 mg/dL (ref 8.9–10.3)
Chloride: 103 mmol/L (ref 98–111)
Creatinine, Ser: 0.76 mg/dL (ref 0.44–1.00)
GFR, Estimated: >60 mL/min (ref >60)
Glucose, Bld: 98 mg/dL (ref 70–99)
Potassium: 3.9 mmol/L (ref 3.5–5.1)
Sodium: 138 mmol/L (ref 135–145)

## 2022-11-19 MED ORDER — HYDRALAZINE HCL 25 MG PO TABS
37.5000 mg | ORAL_TABLET | Freq: Three times a day (TID) | ORAL | Status: DC
  Administered 2022-11-19 – 2022-11-20 (×4): 37.5 mg via ORAL
  Filled 2022-11-19 (×4): qty 2

## 2022-11-19 MED ORDER — SODIUM CHLORIDE 0.9 % IV SOLN: INTRAVENOUS | Status: AC | PRN

## 2022-11-19 MED ORDER — POTASSIUM CHLORIDE CRYS ER 20 MEQ PO TBCR
20.0000 meq | EXTENDED_RELEASE_TABLET | Freq: Every day | ORAL | Status: DC
  Administered 2022-11-20 – 2022-11-23 (×4): 20 meq via ORAL
  Filled 2022-11-19 (×4): qty 1

## 2022-11-19 MED ORDER — GABAPENTIN 400 MG PO CAPS
400.0000 mg | ORAL_CAPSULE | Freq: Every day | ORAL | Status: DC
  Administered 2022-11-19: 400 mg via ORAL
  Filled 2022-11-19: qty 1

## 2022-11-19 NOTE — Progress Notes (Signed)
Physical Therapy Discharge Summary  Patient Details  Name: Tammy [name] MRN: [id number] Date of Birth: Aug 12, 1955  Date of Discharge from PT service:November 22, 2022   Patient has met 9 of 9 long term goals due to improved activity tolerance, improved balance, improved postural control, increased strength, increased range of motion, decreased pain, ability to compensate for deficits, improved attention, improved awareness, and improved coordination.  Patient to discharge at a wheelchair level Modified Independent.   Patient's care partner is independent to provide the necessary physical assistance at discharge.  Recommendation:  Patient will benefit from ongoing skilled PT services in home health setting to continue to advance safe functional mobility, address ongoing impairments in strength, balance, gait, and minimize fall risk.  Equipment: WC, RW, bed rails, ramp, skin inspection mirror, reacher, grab bars in bathroom  Reasons for discharge: treatment goals met and discharge from hospital  Patient/family agrees with progress made and goals achieved: Yes  PT Discharge Precautions/Restrictions Precautions Precautions: Fall Precaution Comments: L AKA Required Braces or Orthoses: Other Brace Other Brace: L LE grey Shinker Restrictions Weight Bearing Restrictions: Yes LLE Weight Bearing: Non weight bearing Pain Interference Pain Interference Pain Effect on Sleep: 3. Frequently Pain Interference with Therapy Activities: 1. Rarely or not at all Pain Interference with Day-to-Day Activities: 1. Rarely or not at all Vision/Perception  Vision - History Ability to See in Adequate Light: 0 Adequate Perception Perception: Within Functional Limits Praxis Praxis: WFL  Cognition Overall Cognitive Status: Within Functional Limits for tasks assessed Arousal/Alertness: Awake/alert Orientation Level: Oriented X4 Memory: Appears intact Awareness: Appears intact Problem  Solving: Appears intact Safety/Judgment: Appears intact Sensation Sensation Light Touch: Impaired Detail Peripheral sensation comments: Increased sensitivy to touch at L residual limb site however improved since eval, intermittent L LE phantom pain Light Touch Impaired Details: Impaired LLE Hot/Cold: Appears Intact Proprioception: Appears Intact Stereognosis: Appears Intact Coordination Gross Motor Movements are Fluid and Coordinated: No Fine Motor Movements are Fluid and Coordinated: Yes Coordination and Movement Description: Altered balance strategies due to L AKA. Heel Shin Test: unable to perform on LLE due to AKA Motor  Motor Motor: Within Functional Limits  Mobility Bed Mobility Bed Mobility: Rolling Right;Rolling Left;Supine to Sit Rolling Right: Independent with assistive device Rolling Left: Independent with assistive device Supine to Sit: Independent with assistive device Transfers Transfers: Sit to Stand;Stand to Sit;Stand Pivot Transfers;Squat Pivot Transfers Sit to Stand: Independent with assistive device Stand to Sit: Independent with assistive device Stand Pivot Transfers: Independent with assistive device Squat Pivot Transfers: Independent with assistive device Transfer (Assistive device): Rolling walker (bed rail or arm rest for squat pivot transfer) Locomotion  Gait Ambulation: Yes Gait Assistance: Independent with assistive device Gait Distance (Feet): 15 Feet Assistive device: Rolling walker Supervision for gait x 30' with RW due to fatigue Gait Gait: Yes Gait Pattern: Impaired Gait Pattern: Poor foot clearance - right (hop to pattern) Stairs / Additional Locomotion Stairs: No Wheelchair Mobility Wheelchair Mobility: Yes Wheelchair Assistance: Independent with assistive device Wheelchair Propulsion: Both upper extremities;Right lower extremity Wheelchair Parts Management: Independent Distance: 150+  Trunk/Postural Assessment  Cervical  Assessment Cervical Assessment: Within Functional Limits Thoracic Assessment Thoracic Assessment: Within Functional Limits Lumbar Assessment Lumbar Assessment: Within Functional Limits Postural Control Postural Control: Within Functional Limits  Balance Balance Balance Assessed: Yes Static Sitting Balance Static Sitting - Balance Support: Feet supported Static Sitting - Level of Assistance: 7: Independent Dynamic Sitting Balance Dynamic Sitting - Balance Support: During functional activity Dynamic Sitting - Level of  Assistance: 7: Independent Static Standing Balance Static Standing - Balance Support: Bilateral upper extremity supported;During functional activity Static Standing - Level of Assistance: 6: Modified independent (Device/Increase time) Dynamic Standing Balance Dynamic Standing - Balance Support: During functional activity Dynamic Standing - Level of Assistance: 6: Modified independent (Device/Increase time) Dynamic Standing - Balance Activities: Lateral lean/weight shifting;Forward lean/weight shifting;Reaching for objects Extremity Assessment  RLE Strength Right Hip Extension: 2+/5 Right Hip ABduction: 4/5 Right Hip ADduction: 4/5 Right Knee Flexion: 4/5 Right Knee Extension: 4-/5 Right Ankle Dorsiflexion: 4-/5 Right Ankle Plantar Flexion: 4-/5 LLE Assessment LLE Assessment: Exceptions to [facility] LLE Strength Left Hip Flexion: 3+/5 Left Hip Extension: 2/5 Left Hip ABduction: 3+/5   Tammy [name] 11/19/2022, 11:51 AM  Tammy [name], PT, DPT, CBIS 11/22/22 10:39 AM

## 2022-11-19 NOTE — Progress Notes (Signed)
PROGRESS NOTE   Subjective/Complaints: Patient was seen by neuropsychology yesterday.  Patient today she says she is able to tolerate the shrinker better, residual limb less sensitive than it was before.  She is having more phantom pain.  Last BM today   Review of Systems  Constitutional:  Negative for fever.  Respiratory:  Negative for shortness of breath.   Cardiovascular:  Negative for chest pain.  Gastrointestinal:  Negative for abdominal pain, constipation, diarrhea, nausea and vomiting.  Genitourinary: Negative.   Musculoskeletal:  Positive for joint pain.  Skin:  Negative for rash.  Neurological:  Negative for dizziness and headaches.  Psychiatric/Behavioral:  The patient is nervous/anxious.       Objective:   No results found. Recent Labs    11/19/22 0340  WBC 5.0  HGB 8.1*  HCT 26.1*  PLT 358   Recent Labs    11/17/22 0408 11/19/22 0340  NA 139 138  K 3.6 3.9  CL 103 103  CO2 26 26  GLUCOSE 96 98  BUN 10 11  CREATININE 0.60 0.76  CALCIUM 9.2 9.4    Intake/Output Summary (Last 24 hours) at 11/19/2022 1302 Last data filed at 11/19/2022 0700 Gross per 24 hour  Intake 596 ml  Output 1250 ml  Net -654 ml        Physical Exam: Vital Signs Blood pressure (!) 162/77, pulse 64, temperature 98.2 F (36.8 C), resp. rate 17, height 5\' 2"  (1.575 m), weight 66.7 kg, SpO2 100%.  Constitutional: No distress . Vital signs reviewed.  Sitting in wheelchair in her room today HEENT: NCAT, EOMI, oral membranes moist Neck: supple Cardiovascular: RRR without murmur. No JVD    Respiratory/Chest: CTA Bilaterally without wheezes or rales. Normal effort    GI/Abdomen: BS +, non-tender, non-distended Ext: no clubbing, cyanosis, LLE edema Psych: Very pleasant Skin: left AK with shrinker in place.  Foam dressing over anterior abrasions on thigh. Neuro:  Alert and oriented x 3 Follows commands. . Normal insight  and awareness. Intact Memory. Normal language and speech. Cranial nerve exam unremarkable. MMT: UE 5/5. RLE 5/5. LLE limited by pain and 2-3/5 HF. Sensory exam normal for light touch and pain in all 4 limbs. No limb ataxia or cerebellar signs. No abnormal tone appreciated.    Musculoskeletal: left thigh swollen and somewhat tender      Assessment/Plan: 1. Functional deficits which require 3+ hours per day of interdisciplinary therapy in a comprehensive inpatient rehab setting. Physiatrist is providing close team supervision and 24 hour management of active medical problems listed below. Physiatrist and rehab team continue to assess barriers to discharge/monitor patient progress toward functional and medical goals  Care Tool:  Bathing    Body parts bathed by patient: Right arm, Left arm, Chest, Abdomen, Front perineal area, Buttocks, Right upper leg, Left upper leg, Right lower leg, Face     Body parts n/a: Left lower leg   Bathing assist Assist Level: Contact Guard/Touching assist     Upper Body Dressing/Undressing Upper body dressing   What is the patient wearing?: Pull over shirt    Upper body assist Assist Level: Set up assist    Lower Body Dressing/Undressing  Lower body dressing      What is the patient wearing?: Underwear/pull up, Pants     Lower body assist Assist for lower body dressing: Contact Guard/Touching assist     Toileting Toileting    Toileting assist Assist for toileting: Contact Guard/Touching assist     Transfers Chair/bed transfer  Transfers assist     Chair/bed transfer assist level: Independent with assistive device     Locomotion Ambulation   Ambulation assist      Assist level: Supervision/Verbal cueing Assistive device: Walker-rolling Max distance: 15   Walk 10 feet activity   Assist     Assist level: Supervision/Verbal cueing Assistive device: Walker-rolling   Walk 50 feet activity   Assist Walk 50 feet with 2  turns activity did not occur: Safety/medical concerns         Walk 150 feet activity   Assist Walk 150 feet activity did not occur: Safety/medical concerns         Walk 10 feet on uneven surface  activity   Assist Walk 10 feet on uneven surfaces activity did not occur: Safety/medical concerns         Wheelchair     Assist Is the patient using a wheelchair?: Yes Type of Wheelchair: Manual    Wheelchair assist level: Independent Max wheelchair distance: 135ft    Wheelchair 50 feet with 2 turns activity    Assist        Assist Level: Independent   Wheelchair 150 feet activity     Assist      Assist Level: Independent   Blood pressure (!) 162/77, pulse 64, temperature 98.2 F (36.8 C), resp. rate 17, height 5\' 2"  (1.575 m), weight 66.7 kg, SpO2 100%.  Medical Problem List and Plan: 1. 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  Antithrombotics: -DVT/anticoagulation:  Pharmaceutical: therapeutic dose lovenox, appreciate pharmacy help -Dopplers with left femoral DVT -pt may gently massage limb for pain purposes -can convert to eliquis prior to discharge as this is covered by pt's insurance. Discussed DVT treatment plan with pt today             -antiplatelet therapy: none   3. Pain Management: Tylenol, oxycodone as needed             -increased gabapentin to 300 mg q HS (for phantom pain) with good results             -continue massage/tactile feedback (gentle for now), vision/mirror therapy  -10/10 increase gabapentin to  400 mg 3 times daily for phantom pain  4. Mood/Behavior/Sleep: LCSW to evaluate and provide emotional support             -history of bipolar: continue Wellbutrin SR 150 mg BID             -antipsychotic agents: n/a   -10/9 mood appears stable, continue current dose of Wellbutrin  -10/10 patient was seen by neuropsychology, appreciate assistance.  Appears to be doing better from this  perspective 5. Neuropsych/cognition: This patient is capable of making decisions on her own behalf.             -pt appears very positive and motivated to regain functional mobility   6. Skin/Wound Care: Routine skin care checks             -local dressing to incision, change daily prn.              -foam dressing to abrasions on anterior thigh  -shrinker sock left AKA ordered 7. Fluids/Electrolytes/Nutrition: Routine Is and Os and follow-up chemistries             -continue Vit C, zinc supplements   -hypokalemia--kdur daily   -10/7 increase kdur to BID for K+ 3.2  -10/8 potassium up to 3.6  -10/10 potassium up to 3.9, will decrease K-Dur back down to 20 mEq daily  -added protein supp for low albumin 8: Hypertension: monitor TID and prn             -continue amlodipine 10 mg daily             -continue hydralazine 25 mg BID   -10/8 Increase hydralazine to 25 mg 3 times daily  -10/10 will increase hydralazine to 37.5 mg 3 times daily    11/19/2022    5:17 AM 11/18/2022    7:59 PM 11/18/2022    2:16 PM  Vitals with BMI  Systolic 162 147 478  Diastolic 77 108 75  Pulse 64 70 80    9: Hyperlipidemia: continue statin   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 Renold Don    12: History of breast cancer on Arimadex 1 mg daily    14: Anemia: acute blood loss atop anemia chronic disease             -CBC with significant drop to 7.6 10/5-->f/u 10/6 stable at  7.7  -continue iron supplement  -check stool for ob x1 although unlikely  -10/10 improved to 8.1   15: GERD: continue PPI      LOS: 6 days A FACE TO FACE EVALUATION WAS PERFORMED  Fanny Dance 11/19/2022, 1:02 PM

## 2022-11-19 NOTE — Evaluation (Signed)
Recreational Therapy Assessment and Plan  Patient Details  Name: Tammy [name] MRN: [id number] Date of Birth: [birth year] Today's Date: 11/19/2022  Rehab Potential:  Good ELOS:   d/c 10/14  Assessment Hospital Problem: Principal Problem:   Left above-knee amputee [facility])     Past Medical History:      Past Medical History:  Diagnosis Date   Anemia      hx of   Anxiety      on meds   Arthritis      back/bilateral ankles   Bipolar disorder (HCC)     Breast cancer (HCC) 2022    LEFT breast-radiation   Depression      on meds   Family history of breast cancer 07/10/2020   Family history of colon cancer 07/10/2020   Family history of ovarian cancer 07/10/2020   GERD (gastroesophageal reflux disease)      on meds   Heart murmur      dx by PCP   Hepatitis C     HTN (hypertension)      on meds   Hx of radiation therapy     Liver cirrhosis (HCC)      from hep c   Neuromuscular disorder (HCC)     Osteopenia     Personal history of radiation therapy     Stroke (HCC) 09/2012    slurred speech and LEFT sided weakness/notes 10/04/2012        Past Surgical History:       Past Surgical History:  Procedure Laterality Date   AMPUTATION Left 11/04/2022    Procedure: LEFT ABOVE KNEE AMPUTATION;  Surgeon: Nadara Mustard, MD;  Location: [facility] OR;  Service: Orthopedics;  Laterality: Left;   ANKLE FRACTURE SURGERY Left 2018   APPLICATION OF WOUND VAC Left 06/15/2022    Procedure: APPLICATION OF WOUND VAC;  Surgeon: Peggye Form, DO;  Location: [facility] ORS;  Service: Plastics;  Laterality: Left;   APPLICATION OF WOUND VAC Left 09/14/2022    Procedure: APPLICATION OF WOUND VAC;  Surgeon: Peggye Form, DO;  Location: [facility] OR;  Service: Plastics;  Laterality: Left;   BREAST LUMPECTOMY Left 07/2020   BREAST LUMPECTOMY WITH RADIOACTIVE SEED AND SENTINEL LYMPH NODE BIOPSY Left 07/26/2020    Procedure: LEFT BREAST LUMPECTOMY WITH RADIOACTIVE SEED AND SENTINEL LYMPH NODE  BIOPSY;  Surgeon: Abigail Miyamoto, MD;  Location: [facility];  Service: General;  Laterality: Left;   CESAREAN SECTION   1986   COLONOSCOPY       DIAGNOSTIC LAPAROSCOPY   1985   HARDWARE REMOVAL Left 04/30/2022    Procedure: HARDWARE REMOVAL LEFT FEMUR;  Surgeon: Samson Frederic, MD;  Location: [facility];  Service: Orthopedics;  Laterality: Left;  150   HARDWARE REMOVAL Left 11/04/2022    Procedure: HARDWARE REMOVAL INFECTED LEFT TOTAL KNEE ARTHROPLASTY;  Surgeon: Nadara Mustard, MD;  Location: [facility] OR;  Service: Orthopedics;  Laterality: Left;   I & D KNEE WITH POLY EXCHANGE Left 06/07/2022    Procedure: IRRIGATION AND DEBRIDEMENT KNEE WITH POLY EXCHANGE;  Surgeon: Samson Frederic, MD;  Location: [facility];  Service: Orthopedics;  Laterality: Left;   INCISION AND DRAINAGE OF WOUND Left 06/04/2022    Procedure: IRRIGATION AND DEBRIDEMENT KNEE;  Surgeon: Samson Frederic, MD;  Location: [facility];  Service: Orthopedics;  Laterality: Left;  60   INCISION AND DRAINAGE OF WOUND Left 06/15/2022    Procedure: excision of knee wound with Myriad;  Surgeon: Peggye Form, DO;  Location: [facility];  Service: Plastics;  Laterality: Left;   ORIF FEMUR FRACTURE Left 04/10/2021    Procedure: OPEN REDUCTION INTERNAL FIXATION (ORIF) DISTAL FEMUR FRACTURE;  Surgeon: Myrene Galas, MD;  Location: [facility] OR;  Service: Orthopedics;  Laterality: Left;   PARTIAL KNEE ARTHROPLASTY Left 04/30/2022    Procedure: DISTAL FEMUR REPLACEMENT;  Surgeon: Samson Frederic, MD;  Location: [facility];  Service: Orthopedics;  Laterality: Left;  150          Assessment & Plan  Clinical Impression: Patient is a 67 y.o. year old female who underwent open reduction of her distal femur fracture April 10, 2021. Patient eventually had the hardware removed in March 2024 with irrigation and debridement with Dr. [name] 3 separate times as well as distal femur replacement with total knee arthroplasty. Patient has undergone 2 wound  debridements and application of myriad with Dr. Ulice Bold on May 6 and August 5. She continued to have purulent drainage in the wound vac; and the wound did not heal with significant pain in the infected area. Skin graft trial previously from plastic surgery failed as well. The hardware remained exposed. She underwent left AKA by Dr. [name] on 11/04/2022. Wound VAC removed 10/02. The amputation site wound cultures remain negative. She will continue on vancomycin and ceftriaxone through 11/05 and follow with ID. Tolerating diet. She continues to progress ambulation in the hallway with RW CGA/Min A. The patient requires inpatient medicine and rehabilitation evaluations and services for ongoing dysfunction secondary to left AKA.    Pt presents with decreased activity tolerance, decreased functional mobility, decreased balance, difficulty maintaining precautions, feelings of stress Limiting pt's independence with leisure/community pursuits.  Met with pt today to discuss TR services including leisure education, activity analysis/modifications and stress management.  Also discussed the importance of social, emotional, spiritual health in addition to physical health and their effects on overall health and wellness.  Pt stated understanding.   Plan  Min 1 TR session >20 minutes during LOS  Recommendations for other services: None   Discharge Criteria: Patient will be discharged from TR if patient refuses treatment 3 consecutive times without medical reason.  If treatment goals not met, if there is a change in medical status, if patient makes no progress towards goals or if patient is discharged from hospital.  The above assessment, treatment plan, treatment alternatives and goals were discussed and mutually agreed upon: by patient  [name] 11/19/2022, 11:09 AM

## 2022-11-19 NOTE — Progress Notes (Signed)
Physical Therapy Session Note  Patient Details  Name: Tammy [name] MRN: [id number] Date of Birth: 12/09/1955  Today's Date: 11/19/2022 PT Individual Time: 0800-0910 PT Individual Time Calculation (min): 70 min   Short Term Goals: Week 1:  PT Short Term Goal 1 (Week 1): pt will transfer bed<>chair with LRAD and supervision PT Short Term Goal 2 (Week 1): pt will transfer sit<>stand with LRAD and supervision PT Short Term Goal 3 (Week 1): pt will perform car transfer with LRAD and CGA  Skilled Therapeutic Interventions/Progress Updates:     Pt seated in WC upon arrival. Pt agreeable to therapy. Pt reports 7/10 L resiudal limb pain, at rest. Nurse present to administer pain medications.   Pt requesting to trial grey shrinker again 2/2 abdominal discomfort with ace bandage. Pt positioned WC with mod I for squat pivot transfre with mod I. Pt performed squat pivot transfer with use of bed rail and sup/mod I.   Pt donned grey shrinker with supervision, and increased time,  verbal cues provided for technique. Pt denies any increased pain, pt no longer screaming with pain.   Education provided on handwashing shrinker every other day with luke warm water with air dry. Pt able to provide teach back of purpose of shrinker. Pt provided teach back of need to perform skin inspection daily, purpose of skin inspection, signs and symptoms of infection, and importance of going to doctor if pt notices any of the signs/symptoms. Pt provided teach back of need to limit unnecessary ambulation, to reduce forces on R LE. Pt ordered skin inspection mirror during session. Pt reports all other equipment was delivered yesterday, and ramp will be complete by discharge. Therapist discontinued stair goal as pt no longer needs it for home entry.   Picking up cones off floor x4 with reacher x4 with out reacher, verbal cues provided for WC positioning to side of cone versus in front for safety and to reduce fall  risk. Education provided on keeping commonly used  items inaccessible with reacher at counter height for improved accessibility/independence. Education provided to keep heavy objects such as laundry detergent at height between hips and shoulders to reduce strain on back and fall risk.   Pt performed squat pivot trasnfer WC to recliner with supervision/CGA, verbal cues provided for positioning of WC.   Pt performed STS with RW and supervision, verbal cues provided for B UE placement on arm rest, as pt requires CGA for stability with unilateral UE support on RW.for power up.   Pt performed 1x10 standing L hip extension for improved L LE positioning, L LE prosthetic fitting, and to reduce risk of hip flexion contracture, pt lacking ~10 deg to neutral. Education provided when not wearing IV to attempt rolling R onto prone. Pt verbalized understanding and agreeable.   Pt seated in WC at end of session with all needs within reach and chair alarm on.      Therapy Documentation Precautions:  Precautions Precautions: Fall Precaution Comments: L AKA Required Braces or Orthoses: Other Brace Other Brace: L Shinker Restrictions Weight Bearing Restrictions: Yes LLE Weight Bearing: Non weight bearing  Therapy/Group: Individual Therapy  [facility] [name], [place], DPT  11/19/2022, 7:40 AM

## 2022-11-19 NOTE — Plan of Care (Signed)
  Problem: RH Stairs Goal: LTG Patient will ambulate up and down stairs w/assist (PT) Description: LTG: Patient will ambulate up and down # of stairs with assistance (PT) Outcome: Not Applicable Flowsheets (Taken 11/19/2022 0803) LTG: Pt will ambulate up/down stairs assist needed:: (Discontinuing goal as p confirms ramp will be installed prior to discharge) --

## 2022-11-19 NOTE — Progress Notes (Signed)
Occupational Therapy Session Note  Patient Details  Name: Tammy [name] MRN: [id number] Date of Birth: Jan 01, 1956  Today's Date: 11/19/2022 OT Individual Time: 0945-1100 OT Individual Time Calculation (min): 75 min    Short Term Goals: Week 1:  OT Short Term Goal 1 (Week 1): Pt will stand up to 3 min for sinkside tasks with close S OT Short Term Goal 2 (Week 1): Transfer to TTB with dist S OT Short Term Goal 3 (Week 1): LB dressing with dist s  Skilled Therapeutic Interventions/Progress Updates:   Pt seen for skilled OT session. Focus on ADL simulated apartment access and safety as well as overall activity tolerance and balance for A/IADL's. Pt self propelled from room to apt and back with OT managing IV abx pole. Pt able to set up to tub transfer and perform with S. Educated in kitchen in UE support with sink support and use of reacher. Stood up to 4 min x 4 sets with 6-8 " unilateral UE reach. Pt then performed 4 sets of 4 min seated level B UE restorator training on [employer] L1. Brief rests between intervals. Finished session with toileting and clothing management from w/c level via SPT with S. Left w/c level with chair alarm, needs and nurse call button in reach.   Pain: 6/10 L residual limb fading to 2/10 after meds and distraction   Therapy Documentation Precautions:  Precautions Precautions: Fall Precaution Comments: L AKA Required Braces or Orthoses: Other Brace Other Brace: L Shinker Restrictions Weight Bearing Restrictions: Yes LLE Weight Bearing: Non weight bearing   Therapy/Group: Individual Therapy  Vicenta Dunning 11/19/2022, 7:52 AM

## 2022-11-19 NOTE — Group Note (Signed)
Patient Details Name: Tammy [name] MRN: [id number] DOB: April 11, 1955 Today's Date: 11/19/2022  Time Calculation: OT Group Time Calculation OT Group Start Time: 1430 OT Group Stop Time: 1530 OT Group Time Calculation (min): 60 min      Group Description: Dance Group: Pt participated in dance group with an emphasis on social interaction, motor planning, increasing overall activity tolerance and bimanual tasks. All songs were selected by group members. Dance moves included AROM of BUE/BLE gross motor movements with an emphasis on building functional endurance.    Individual level documentation: Patient completed group from sitting level. Patientt needed supervision to complete various dance moves with cues to initiate rest breaks.  Patient needed min modifications during group.  Pain:  0/10  Precautions:  Falls   Clide Deutscher 11/19/2022, 3:42 PM

## 2022-11-19 NOTE — Group Note (Unsigned)
Patient Details Name: Tammy [name] MRN: [id number] DOB: [birth year] Today's Date: 11/19/2022  Time Calculation:        Group Description: Dance Group: Pt participated in dance group with an emphasis on social interaction, motor planning, increasing overall activity tolerance and bimanual tasks. All songs were selected by group members. Dance moves included AROM of BUE/BLE gross motor movements with an emphasis on building functional endurance.    Individual level documentation: Patient completed group from {sitting/standing:28286} level. Patientt needed {assistance:28279} to complete various dance moves with cues for ***.  Patient needed *** modifications during group.  Pain:    Precautions:    Milo Schreier 11/19/2022, 3:44 PM

## 2022-11-19 NOTE — Group Note (Signed)
Patient Details Name: Tammy [name] MRN: [id number] DOB: [birth year] Today's Date: 11/19/2022 Pt will participate in 60 minute dance group with supervision.  MET Group Description: Dance Group: Pt participated in dance group with an emphasis on social interaction, motor planning, increasing overall activity tolerance and bimanual tasks. All songs were selected by group members. Dance moves included AROM of BUE/BLE gross motor movements with an emphasis on building functional endurance.    Individual level documentation: Patient completed group from sitting level. Patient needed supervision to complete various dance moves with cues for pain management/energy conservation.  Patient needed minimal modifications during group.  Pain: c/o pain, unrated. Pt stated its tolerable.  Encouraged rest breaks throughout session.     Youssef Footman 11/19/2022, 3:47 PM

## 2022-11-20 LAB — VANCOMYCIN, TROUGH: Vancomycin Tr: 12 ug/mL — ABNORMAL LOW (ref 15–20)

## 2022-11-20 MED ORDER — HYDRALAZINE HCL 50 MG PO TABS
50.0000 mg | ORAL_TABLET | Freq: Three times a day (TID) | ORAL | Status: DC
  Administered 2022-11-20 – 2022-11-22 (×5): 50 mg via ORAL
  Filled 2022-11-20 (×5): qty 1

## 2022-11-20 MED ORDER — SODIUM CHLORIDE 0.9 % IV SOLN: INTRAVENOUS | Status: AC | PRN

## 2022-11-20 MED ORDER — GABAPENTIN 300 MG PO CAPS
300.0000 mg | ORAL_CAPSULE | Freq: Three times a day (TID) | ORAL | Status: DC
  Administered 2022-11-20 – 2022-11-21 (×3): 300 mg via ORAL
  Filled 2022-11-20 (×3): qty 1

## 2022-11-20 NOTE — Progress Notes (Signed)
Occupational Therapy Note  Patient Details  Name: Tammy [name] MRN: [id number] Date of Birth: December 04, 1955  Today's Date: 11/20/2022 OT Missed Time: 60 Minutes Missed Time Reason: Pain  Pt declined group session d/t pain, will check back as time allows to make up for missed minutes.    Pollyann Glen [facility] 11/20/2022, 12:25 PM

## 2022-11-20 NOTE — Progress Notes (Signed)
PROGRESS NOTE   Subjective/Complaints: Patient reports she has been having more phantom pain recently.  Pain is not controlled with current dose of gabapentin.  Last BM yesterday   Review of Systems  Constitutional:  Negative for fever and malaise/fatigue.  HENT:  Negative for congestion.   Respiratory:  Negative for shortness of breath.   Cardiovascular:  Negative for chest pain and palpitations.  Gastrointestinal:  Negative for abdominal pain, constipation, diarrhea, nausea and vomiting.  Genitourinary: Negative.   Musculoskeletal:  Positive for joint pain.  Neurological:  Negative for dizziness and headaches.  Psychiatric/Behavioral:  The patient is nervous/anxious.       Objective:   No results found. Recent Labs    11/19/22 0340  WBC 5.0  HGB 8.1*  HCT 26.1*  PLT 358   Recent Labs    11/19/22 0340  NA 138  K 3.9  CL 103  CO2 26  GLUCOSE 98  BUN 11  CREATININE 0.76  CALCIUM 9.4    Intake/Output Summary (Last 24 hours) at 11/20/2022 1540 Last data filed at 11/20/2022 1308 Gross per 24 hour  Intake 1124.38 ml  Output 975 ml  Net 149.38 ml        Physical Exam: Vital Signs Blood pressure (!) 155/76, pulse 82, temperature 98.3 F (36.8 C), temperature source Oral, resp. rate 17, height 5\' 2"  (1.575 m), weight 66.7 kg, SpO2 100%.  Constitutional: No distress . Vital signs reviewed.  Sitting in wheelchair in her room today HEENT: NCAT, EOMI, oral membranes moist Neck: supple Cardiovascular: RRR without murmur. No JVD    Respiratory/Chest: CTA Bilaterally without wheezes or rales. Normal effort    GI/Abdomen: BS +, non-tender, non-distended Ext: no clubbing, cyanosis, LLE edema Psych: Very pleasant Skin: left AK with shrinker in place.  Foam dressing over anterior abrasions on thigh. Neuro:  Alert and oriented x 3 Follows commands. . Normal insight and awareness. Intact Memory. Normal language  and speech. Cranial nerve exam unremarkable. MMT: UE 5/5. RLE 5/5. LLE limited by pain and 4-/5 HF. Sensory exam normal for light touch and pain in all 4 limbs. No limb ataxia or cerebellar signs. No abnormal tone appreciated.    Musculoskeletal: left thigh swelling and tenderness improved from several days prior     Assessment/Plan: 1. Functional deficits which require 3+ hours per day of interdisciplinary therapy in a comprehensive inpatient rehab setting. Physiatrist is providing close team supervision and 24 hour management of active medical problems listed below. Physiatrist and rehab team continue to assess barriers to discharge/monitor patient progress toward functional and medical goals  Care Tool:  Bathing    Body parts bathed by patient: Right arm, Left arm, Chest, Abdomen, Front perineal area, Buttocks, Right upper leg, Left upper leg, Right lower leg, Face     Body parts n/a: Left lower leg   Bathing assist Assist Level: Contact Guard/Touching assist     Upper Body Dressing/Undressing Upper body dressing   What is the patient wearing?: Pull over shirt    Upper body assist Assist Level: Set up assist    Lower Body Dressing/Undressing Lower body dressing      What is the patient wearing?:  Underwear/pull up, Pants     Lower body assist Assist for lower body dressing: Contact Guard/Touching assist     Toileting Toileting    Toileting assist Assist for toileting: Contact Guard/Touching assist     Transfers Chair/bed transfer  Transfers assist     Chair/bed transfer assist level: Independent with assistive device     Locomotion Ambulation   Ambulation assist      Assist level: Supervision/Verbal cueing Assistive device: Walker-rolling Max distance: 15   Walk 10 feet activity   Assist     Assist level: Supervision/Verbal cueing Assistive device: Walker-rolling   Walk 50 feet activity   Assist Walk 50 feet with 2 turns activity did not  occur: Safety/medical concerns         Walk 150 feet activity   Assist Walk 150 feet activity did not occur: Safety/medical concerns         Walk 10 feet on uneven surface  activity   Assist Walk 10 feet on uneven surfaces activity did not occur: Safety/medical concerns         Wheelchair     Assist Is the patient using a wheelchair?: Yes Type of Wheelchair: Manual    Wheelchair assist level: Independent Max wheelchair distance: 134ft    Wheelchair 50 feet with 2 turns activity    Assist        Assist Level: Independent   Wheelchair 150 feet activity     Assist      Assist Level: Independent   Blood pressure (!) 155/76, pulse 82, temperature 98.3 F (36.8 C), temperature source Oral, resp. rate 17, height 5\' 2"  (1.575 m), weight 66.7 kg, SpO2 100%.  Medical Problem List and Plan: 1. 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  Antithrombotics: -DVT/anticoagulation:  Pharmaceutical: therapeutic dose lovenox, appreciate pharmacy help -Dopplers with left femoral DVT -pt may gently massage limb for pain purposes -can convert to eliquis prior to discharge as this is covered by pt's insurance. Discussed DVT treatment plan with pt today             -antiplatelet therapy: none   3. Pain Management: Tylenol, oxycodone as needed             -increased gabapentin to 300 mg q HS (for phantom pain) with good results             -continue massage/tactile feedback (gentle for now), vision/mirror therapy  -10/11 will adjust  gabapentin to 300 mg 3 times daily, she was only getting gabapentin at bedtime previously  4. Mood/Behavior/Sleep: LCSW to evaluate and provide emotional support             -history of bipolar: continue Wellbutrin SR 150 mg BID             -antipsychotic agents: n/a   -10/9 mood appears stable, continue current dose of Wellbutrin  -10/10 patient was seen by neuropsychology, appreciate assistance.  Appears to be doing better from this perspective  -Mood has  been improving 5. Neuropsych/cognition: This patient is capable of making decisions on her own behalf.             -pt appears very positive and motivated to regain functional mobility   6. Skin/Wound Care: Routine skin care checks             -local dressing to incision, change daily prn.              -foam dressing to abrasions on anterior thigh  -shrinker sock left AKA ordered 7. Fluids/Electrolytes/Nutrition: Routine Is and Os and follow-up chemistries             -continue Vit C, zinc supplements   -hypokalemia--kdur daily   -10/7 increase kdur to BID for K+ 3.2  -10/8 potassium up to 3.6  -10/10 potassium up to 3.9, will decrease K-Dur back down to 20 mEq daily  Recheck K+ monday  -added protein supp for low albumin 8: Hypertension: monitor TID and prn             -continue amlodipine 10 mg daily             -continue hydralazine 25 mg BID   -10/8 Increase hydralazine to 25 mg 3 times daily  -10/11 Increase hydralazine to 50mg  TID    11/20/2022    1:06 PM 11/20/2022    4:57 AM 11/19/2022    7:35 PM  Vitals with BMI  Systolic 155 154 161  Diastolic 76 78 91  Pulse 82 66 71    9: Hyperlipidemia: continue statin   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 Renold Don   Vanc trough 12 today   12: History of breast cancer on Arimadex 1 mg daily    14: Anemia: acute blood loss atop  anemia chronic disease             -CBC with significant drop to 7.6 10/5-->f/u 10/6 stable at 7.7  -continue iron supplement  -check stool for ob x1 although unlikely  -10/10 improved to 8.1  Recheck monday   15: GERD: continue PPI      LOS: 7 days A FACE TO FACE EVALUATION WAS PERFORMED  Fanny Dance 11/20/2022, 3:40 PM

## 2022-11-20 NOTE — Progress Notes (Signed)
Physical Therapy Session Note  Patient Details  Name: Tammy [name] MRN: [id number] Date of Birth: 1955-04-01  Today's Date: 11/20/2022 PT Individual Time: 5784-6962; 1303 - 1400 PT Individual Time Calculation (min): 42 min; 57 min   Short Term Goals: Week 1:  PT Short Term Goal 1 (Week 1): pt will transfer bed<>chair with LRAD and supervision PT Short Term Goal 2 (Week 1): pt will transfer sit<>stand with LRAD and supervision PT Short Term Goal 3 (Week 1): pt will perform car transfer with LRAD and CGA  SESSION 1 Skilled Therapeutic Interventions/Progress Updates: Patient sitting in WC on entrance to room. Patient alert and agreeable to PT session.   Patient reported 6/10 pain in residual limb at end of session with attending RN notified. Pt new WC arrived (pt in hospital provided [facility] currently). Today's session focused on WC mobility with new WC (pt also reported soreness on R LE with attending PT noting to state that pt should take it easy for a day to regain strength).  PTA and pt discussed upcoming family education. Pt would like to show fiance CLOF including WC mobility, furniture transfers, car transfers (fiance has SUV - Pt encouraged pt to ask fiance to take a picture and measurement of height).   Therapeutic Activity: Transfers: Pt performed squat pivot transfer from old WC to new WC with close supervision for safety and no VC required (pt safely positioned WC to transfer without any assistance).  Wheelchair Mobility:  Pt propelled wheelchair room<main gym with supervision/modI. Pt encouraged to take rest breaks to conserve energy (several quick breaks required). Pt reported that new WC feels like it is hard to maneuver/rotate wheels. PTA donned coban around B hand rims in order to assist with pt energy conservation and decrease WC mobility difficulty. After coban wrapped around rims, pt reported that it feels like less energy is required to propel WC. Pt propelled back to  room from main gym without taking a rest break due to coban wrapping. Pt demonstrates safe navigation of obstacles and busy hallway throughout session.  Patient sitting in WC at end of session with brakes locked and all needs within reach. Pt waiting on NT to assist to restroom, and RN to provide IV fluids.  SESSION 2 Skilled Therapeutic Interventions/Progress Updates: Patient sitting in [facility] with family present on entrance to room. Patient alert and agreeable to PT session.   Patient reported 2/10 pain on L residual pain.  Pt family shown pt's CLOF with all listed below. Pt family educated that even though pt performs well with transfers to stay close during transfers when at home due to new environment (use of gait belt) as well.  PTA provided pt's fiance with "how to build a ramp" sheet as there is still one yet to be made. Pt fiance reported it will be made by then. PTA suggested to practice stairs via shower chair method in case a ramp is not built, but pt reported use of stairs (if needed) in a method not yet practiced (B UE's on one hand rail and "hopping" up each step with help from fiance on other side. To which PTA encouraged to practice that method now to determine how safe it is, and how to cue the family/pt to the sequence. Pt declined and stated that the ramp will be made and ready at d/c.  Pt family with no other questions or concerns.  Therapeutic Activity: Transfers: Pt performed squat pivot transfers throughout session with no AD to and from  various surfaces (WC<>car, WC<>ADL bed) with supervision for safety. No VC required during those transfers, and pt demos safe navigation of car transfers. Only VC provided was to bring [facility] close to transferring surface, lock brake closest to said surface to allow space to fit hand between surface and WC, and then to roll far side to close gap (pt found this method helpful and easier).  Pt did required CGA/light minA to transfer from recliner in ADL  apartment to [facility] per report of fatigue in R LE.   Pt performed ramp navigation by propelling backwards as it was easier to ascend, and forward when descending. Pt demos safe navigation of ramps with family encouraged to stay close to pt after d/c for safety when navigating new ramp at home. PTA demonstrated curb navigation to pt's family as a precaution if needed at some point in the future (hip placement, foot placement of frame of WC to lift castor wheels, and body mechanics for safe navigation). Pt family denied wanting to practice as they will avoid needing to do so, and to instead find WC accessibility.   Wheelchair Mobility:  Pt propelled wheelchair throughout session (room<ortho gym<main gym<ADL apartment) with modI. No VC required or rest breaks. Pt reported coban wrap from earlier significantly helps conserve energy. Pt provided with WC bag and WC grip tubes to replace extended handles if needed.   Patient handed off to OT in ADL apartment at end of session.      Therapy Documentation Precautions:  Precautions Precautions: Fall Precaution Comments: L AKA Required Braces or Orthoses: Other Brace Other Brace: L LE grey Shinker Restrictions Weight Bearing Restrictions: Yes LLE Weight Bearing: Non weight bearing   Therapy/Group: Individual Therapy  Rai Severns PTA 11/20/2022, 10:34 AM

## 2022-11-20 NOTE — Progress Notes (Signed)
Occupational Therapy Session Note  Patient Details  Name: Tammy [name] MRN: [id number] Date of Birth: 1955/11/29  Today's Date: 11/20/2022 OT Individual Time: 4098-1191 OT Individual Time Calculation (min): 29 min    Short Term Goals: Week 1:  OT Short Term Goal 1 (Week 1): Pt will stand up to 3 min for sinkside tasks with close S OT Short Term Goal 2 (Week 1): Transfer to TTB with dist S OT Short Term Goal 3 (Week 1): LB dressing with dist s  Skilled Therapeutic Interventions/Progress Updates:   Pt seen for brief Family Education session. Handoff from PT in ADL apt to simulate light kitchen and bathing tasks. Use of DME in tub shower with TTB with finace [name] indep for assisting with w/c set up, use of RW for support and shower safety. OT conducted training for B UE HEP, joint protection and falls prevention with family teach back independently. Left pt w/c level with family bedside and all safety measures and needs in place.   Pain: 2/10 L residual limb with grey shrinker and repositioning as well as RN having given meds for management   Therapy Documentation Precautions:  Precautions Precautions: Fall Precaution Comments: L AKA Required Braces or Orthoses: Other Brace Other Brace: L LE grey Shinker Restrictions Weight Bearing Restrictions: Yes LLE Weight Bearing: Non weight bearing  Therapy/Group: Individual Therapy  Vicenta Dunning 11/20/2022, 7:47 AM

## 2022-11-20 NOTE — Progress Notes (Signed)
Pharmacy Antibiotic + Anticoagulation Note  Tammy [name] is a 67 y.o. 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 S/P L AKA 9/25 though some of the intramedullary implant was unable to be resected. Pharmacy has been consulted for vancomycin dosing. Antibiotics planned through 12/15/22 per ID.  OPAT orders 11/10/22.   Vancomycin peak and trough levels at goal on 11/13/22 and same regimen continued. Renal function stable. Trough level today is 12 mcg/ml, will continue same regimen.  Lovenox 70 mg (~1 mg/kg) SQ Q12hrs begun 10/5 for L femoral DVT. Noted plan to transition to Eliquis prior to discharge. Hemglobin high 7s > 8.1 on 10/10. Platelet count stable.   Plan: Continue Vancomycin 750 mg IV q12hrs. Goal AUC 400-500. Weekly Vancomycin levels. Also on Ceftriaxone 2gm IV q24hrs Antibiotics thru 12/15/22 per ID.   Continue Lovenox 70 mg SQ q12hrs. Follow up for transition to Eliquis. Bmet and CBC twice a week. Next labs on 10/14.  Height: 5\' 2"  (157.5 cm) Weight: 66.7 kg (147 lb 0.8 oz) IBW/kg (Calculated) : 50.1  Temp (24hrs), Avg:98.1 F (36.7 C), Min:98 F (36.7 C), Max:98.3 F (36.8 C)  Recent Labs  Lab 11/13/22 2140 11/14/22 0413 11/16/22 0439 11/17/22 0408 11/19/22 0340 11/20/22 0750  WBC  --  5.5 4.7  --  5.0  --   CREATININE  --  0.52 0.56 0.60 0.76  --   VANCOTROUGH 8*  --   --   --   --  12*    Estimated Creatinine Clearance: 60.2 mL/min (by C-G formula based on SCr of 0.76 mg/dL).    Allergies  Allergen Reactions   Lisinopril Cough    Antimicrobials this admission: Cefazolin 9/25 >> 9/25 Ceftriaxone 9/25 >>(11/5) Vancomycin 9/25 >> (11/5)  Dose adjustments this admission: 9/30: Vanc peak 35, Vanc trough 5, calculated AUC 379 on 1250 mg IV q24hrs > changed to 750 mg IV q12h  10/4: Vanc peak 24, Vanc trough 8, calculated AUC 400 on 750 mg IV q12h - continue 10/11: Vanc trough 12 - continue 750 mg IV q12h  Microbiology  results: 9/25 tissue L BKA, specimens A and B: negative 9/25 MRSA PCR: negative  Thank you for allowing pharmacy to be a part of this patient's care.  Dennie Fetters, RPh 11/20/2022 1:11 PM

## 2022-11-21 MED ORDER — SODIUM CHLORIDE 0.9 % IV SOLN: INTRAVENOUS | Status: DC | PRN

## 2022-11-21 MED ORDER — GABAPENTIN 300 MG PO CAPS
600.0000 mg | ORAL_CAPSULE | Freq: Every day | ORAL | Status: DC
  Administered 2022-11-21 – 2022-11-22 (×2): 600 mg via ORAL
  Filled 2022-11-21 (×2): qty 2

## 2022-11-21 MED ORDER — GABAPENTIN 300 MG PO CAPS
300.0000 mg | ORAL_CAPSULE | Freq: Two times a day (BID) | ORAL | Status: DC
  Administered 2022-11-21 – 2022-11-23 (×4): 300 mg via ORAL
  Filled 2022-11-21 (×4): qty 1

## 2022-11-21 MED ORDER — ACETAMINOPHEN 500 MG PO TABS
1000.0000 mg | ORAL_TABLET | Freq: Three times a day (TID) | ORAL | Status: DC
  Administered 2022-11-21 – 2022-11-23 (×6): 1000 mg via ORAL
  Filled 2022-11-21 (×6): qty 2

## 2022-11-21 NOTE — Progress Notes (Signed)
PROGRESS NOTE   Subjective/Complaints:  No events overnight. Vitals stable with mild, persistent hypertension into 150s.  Last BM 10/11 Ongoing phantom limb/neuropathic pain 6-7/10  Review of Systems  Constitutional:  Negative for fever and malaise/fatigue.  HENT:  Negative for congestion.   Respiratory:  Negative for shortness of breath.   Cardiovascular:  Negative for chest pain and palpitations.  Gastrointestinal:  Negative for abdominal pain, constipation, diarrhea, nausea and vomiting.  Genitourinary: Negative.   Musculoskeletal:  Positive for joint pain.  Neurological:  Negative for dizziness and headaches.  Psychiatric/Behavioral:  The patient is nervous/anxious.       Objective:   No results found. Recent Labs    11/19/22 0340  WBC 5.0  HGB 8.1*  HCT 26.1*  PLT 358   Recent Labs    11/19/22 0340  NA 138  K 3.9  CL 103  CO2 26  GLUCOSE 98  BUN 11  CREATININE 0.76  CALCIUM 9.4    Intake/Output Summary (Last 24 hours) at 11/21/2022 1122 Last data filed at 11/21/2022 0420 Gross per 24 hour  Intake 1293.15 ml  Output 800 ml  Net 493.15 ml        Physical Exam: Vital Signs Blood pressure (!) 155/86, pulse 73, temperature 98 F (36.7 C), temperature source Oral, resp. rate 18, height 5\' 2"  (1.575 m), weight 66.7 kg, SpO2 100%.  Constitutional: No distress . Vital signs reviewed.  Sitting in wheelchair in her room today HEENT: NCAT, EOMI, oral membranes moist Neck: supple Cardiovascular: RRR without murmur. No JVD    Respiratory/Chest: CTA Bilaterally without wheezes or rales. Normal effort    GI/Abdomen: BS +, non-tender, non-distended Ext: no clubbing, cyanosis, LLE edema Psych: Very pleasant Skin: left AK with shrinker in place.  Foam dressing over anterior abrasions on thigh. Neuro:  Alert and oriented x 3 Follows commands. . Normal insight and awareness. Intact Memory. Normal language  and speech. Cranial nerve exam unremarkable. MMT: UE 5/5. RLE 5/5. LLE limited by pain and 4-/5 HF. Sensory exam normal for light touch and pain in all 4 limbs. No limb ataxia or cerebellar signs. No abnormal tone appreciated.    Musculoskeletal: left thigh swelling and tenderness improved from several days prior     Assessment/Plan: 1. Functional deficits which require 3+ hours per day of interdisciplinary therapy in a comprehensive inpatient rehab setting. Physiatrist is providing close team supervision and 24 hour management of active medical problems listed below. Physiatrist and rehab team continue to assess barriers to discharge/monitor patient progress toward functional and medical goals  Care Tool:  Bathing    Body parts bathed by patient: Right arm, Left arm, Chest, Abdomen, Front perineal area, Buttocks, Right upper leg, Left upper leg, Right lower leg, Face     Body parts n/a: Left lower leg   Bathing assist Assist Level: Contact Guard/Touching assist     Upper Body Dressing/Undressing Upper body dressing   What is the patient wearing?: Pull over shirt    Upper body assist Assist Level: Set up assist    Lower Body Dressing/Undressing Lower body dressing      What is the patient wearing?: Underwear/pull up, Pants  Lower body assist Assist for lower body dressing: Contact Guard/Touching assist     Toileting Toileting    Toileting assist Assist for toileting: Contact Guard/Touching assist     Transfers Chair/bed transfer  Transfers assist     Chair/bed transfer assist level: Independent with assistive device     Locomotion Ambulation   Ambulation assist      Assist level: Supervision/Verbal cueing Assistive device: Walker-rolling Max distance: 15   Walk 10 feet activity   Assist     Assist level: Supervision/Verbal cueing Assistive device: Walker-rolling   Walk 50 feet activity   Assist Walk 50 feet with 2 turns activity did not  occur: Safety/medical concerns         Walk 150 feet activity   Assist Walk 150 feet activity did not occur: Safety/medical concerns         Walk 10 feet on uneven surface  activity   Assist Walk 10 feet on uneven surfaces activity did not occur: Safety/medical concerns         Wheelchair     Assist Is the patient using a wheelchair?: Yes Type of Wheelchair: Manual    Wheelchair assist level: Independent Max wheelchair distance: 176ft    Wheelchair 50 feet with 2 turns activity    Assist        Assist Level: Independent   Wheelchair 150 feet activity     Assist      Assist Level: Independent   Blood pressure (!) 155/86, pulse 73, temperature 98 F (36.7 C), temperature source Oral, resp. rate 18, height 5\' 2"  (1.575 m), weight 66.7 kg, SpO2 100%.  Medical Problem List and Plan: 1. 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  Antithrombotics: -DVT/anticoagulation:  Pharmaceutical: therapeutic dose lovenox, appreciate pharmacy help -Dopplers with left femoral DVT -pt may gently massage limb for pain purposes -can convert to eliquis prior to discharge as this is covered by pt's insurance. Discussed DVT treatment plan with pt today             -antiplatelet therapy: none   3. Pain Management: Tylenol, oxycodone as needed             -increased gabapentin to 300 mg q HS (for phantom pain) with good results             -continue massage/tactile feedback (gentle for now), vision/mirror therapy  -10/11 will adjust  gabapentin to 300 mg 3 times daily, she was only getting gabapentin at bedtime previously  10/12: using PRN oxycodone consistently; *** gabapentin benefit  4. Mood/Behavior/Sleep: LCSW to evaluate and provide emotional support             -history of bipolar: continue Wellbutrin SR 150 mg BID             -antipsychotic agents: n/a   -10/9 mood appears stable, continue current dose of Wellbutrin  -10/10 patient was seen by neuropsychology, appreciate assistance.  Appears to be doing better from this perspective  -  Mood has been improving  5. Neuropsych/cognition: This patient is capable of making decisions on her own behalf.             -pt appears very positive and motivated to regain functional mobility   6. Skin/Wound Care: Routine skin care checks             -local dressing to incision, change daily prn.              -foam dressing to abrasions on anterior thigh  -shrinker sock left AKA ordered 7. Fluids/Electrolytes/Nutrition: Routine Is and Os and follow-up chemistries             -continue Vit C, zinc supplements   -hypokalemia--kdur daily   -10/7 increase kdur to BID for K+ 3.2  -10/8 potassium up to 3.6  -10/10 potassium up to 3.9, will decrease K-Dur back down to 20 mEq daily  Recheck K+ monday  -added protein supp for low albumin  8: Hypertension: monitor TID and prn             -continue amlodipine 10 mg daily             -continue hydralazine 25 mg BID   -10/8 Increase hydralazine to 25 mg 3 times daily  -10/11 Increase hydralazine to 50mg  TID - monitor 1 day before further increase    11/21/2022    4:20 AM 11/20/2022    7:29 PM 11/20/2022    1:06 PM  Vitals with BMI  Systolic 155 134 295  Diastolic 86 72 76  Pulse 73 78 82    9: Hyperlipidemia: continue statin   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 Renold Don   Vanc trough  12 today   12: History of breast cancer on Arimadex 1 mg daily    14: Anemia: acute blood loss atop anemia chronic disease             -CBC with significant drop to 7.6 10/5-->f/u 10/6 stable at 7.7  -continue iron supplement  -check stool for ob x1 although unlikely  -10/10 improved to 8.1  Recheck monday   15: GERD: continue PPI      LOS: 8 days A FACE TO FACE EVALUATION WAS PERFORMED  Angelina Sheriff 11/21/2022, 11:22 AM

## 2022-11-21 NOTE — Plan of Care (Signed)
  Problem: RH BOWEL ELIMINATION Goal: RH STG MANAGE BOWEL WITH ASSISTANCE Description: STG Manage Bowel with toileting Assistance. Flowsheets (Taken 11/21/2022 0358) STG: Pt will manage bowels with assistance: 6-Modified independent Goal: RH STG MANAGE BOWEL W/MEDICATION W/ASSISTANCE Description: STG Manage Bowel with Medication with mod I  Assistance. Flowsheets (Taken 11/21/2022 0358) STG: Pt will manage bowels with medication with assistance: 7-Independent   Problem: RH SAFETY Goal: RH STG ADHERE TO SAFETY PRECAUTIONS W/ASSISTANCE/DEVICE Description: STG Adhere to Safety Precautions With cues Assistance/Device. Flowsheets (Taken 11/21/2022 0358) STG:Pt will adhere to safety precautions with assistance/device: 7-Independent   Problem: RH KNOWLEDGE DEFICIT LIMB LOSS Goal: RH STG INCREASE KNOWLEDGE OF SELF CARE AFTER LIMB LOSS Description: Patient and S.O. will be able to manage care at discharge using educational resources independently Note: Pain <2 on pain scale

## 2022-11-22 MED ORDER — HYDRALAZINE HCL 50 MG PO TABS
75.0000 mg | ORAL_TABLET | Freq: Three times a day (TID) | ORAL | Status: DC
  Administered 2022-11-22 – 2022-11-23 (×3): 75 mg via ORAL
  Filled 2022-11-22 (×3): qty 1

## 2022-11-22 MED ORDER — MUSCLE RUB 10-15 % EX CREA: TOPICAL_CREAM | CUTANEOUS | Status: DC | PRN

## 2022-11-22 NOTE — Progress Notes (Signed)
PROGRESS NOTE   Subjective/Complaints:  No events overnight. Phantom pains improved with increases gabapentin. Ongoing mild HTN 140s ovenright Last BM 10/12   ROS: Positives per HPI above. Denies fevers, chills, N/V, abdominal pain, SOB, chest pain, new weakness or paraesthesias.      Objective:   No results found. No results for input(s): "WBC", "HGB", "HCT", "PLT" in the last 72 hours.  No results for input(s): "NA", "K", "CL", "CO2", "GLUCOSE", "BUN", "CREATININE", "CALCIUM" in the last 72 hours.   Intake/Output Summary (Last 24 hours) at 11/22/2022 1121 Last data filed at 11/22/2022 0918 Gross per 24 hour  Intake 767.31 ml  Output 600 ml  Net 167.31 ml        Physical Exam: Vital Signs Blood pressure (!) 147/68, pulse (!) 58, temperature 98.4 F (36.9 C), temperature source Oral, resp. rate 18, height 5\' 2"  (1.575 m), weight 66.7 kg, SpO2 97%.  Constitutional: No distress . Vital signs reviewed.  Sitting in bed.  HEENT: NCAT, EOMI, oral membranes moist Neck: supple Cardiovascular: RRR without murmur. No JVD    Respiratory/Chest: CTA Bilaterally without wheezes or rales. Normal effort    GI/Abdomen: BS +, non-tender, non-distended Ext: no clubbing, cyanosis, + LLE edema Psych: Very pleasant Skin: Surgical site well approximated, no active drainage, staples intact.  Neuro:   AAOx3  No apparent cognitive deficits  Moving all 4 extremities antigravity and against resistance Prior exam:   Alert and oriented x 3 Follows commands. . Normal insight and awareness. Intact Memory. Normal language and speech. Cranial nerve exam unremarkable. MMT: UE 5/5. RLE 5/5. LLE limited by pain and 4-/5 HF. Sensory exam normal for light touch and pain in all 4 limbs. No limb ataxia or cerebellar signs. No abnormal tone appreciated.    Musculoskeletal: left thigh swelling and tenderness improved from several days  prior     Assessment/Plan: 1. Functional deficits which require 3+ hours per day of interdisciplinary therapy in a comprehensive inpatient rehab setting. Physiatrist is providing close team supervision and 24 hour management of active medical problems listed below. Physiatrist and rehab team continue to assess barriers to discharge/monitor patient progress toward functional and medical goals  Care Tool:  Bathing    Body parts bathed by patient: Right arm, Left arm, Chest, Abdomen, Front perineal area, Buttocks, Right upper leg, Left upper leg, Right lower leg, Face     Body parts n/a: Left lower leg   Bathing assist Assist Level: Contact Guard/Touching assist     Upper Body Dressing/Undressing Upper body dressing   What is the patient wearing?: Pull over shirt    Upper body assist Assist Level: Set up assist    Lower Body Dressing/Undressing Lower body dressing      What is the patient wearing?: Underwear/pull up, Pants     Lower body assist Assist for lower body dressing: Contact Guard/Touching assist     Toileting Toileting    Toileting assist Assist for toileting: Contact Guard/Touching assist     Transfers Chair/bed transfer  Transfers assist     Chair/bed transfer assist level: Independent with assistive device Chair/bed transfer assistive device: Armrests   Locomotion Ambulation   Ambulation assist  Assist level: Supervision/Verbal cueing Assistive device: Walker-rolling Max distance: 30'   Walk 10 feet activity   Assist     Assist level: Supervision/Verbal cueing Assistive device: Walker-rolling   Walk 50 feet activity   Assist Walk 50 feet with 2 turns activity did not occur: Safety/medical concerns (endurance)         Walk 150 feet activity   Assist Walk 150 feet activity did not occur: Safety/medical concerns         Walk 10 feet on uneven surface  activity   Assist Walk 10 feet on uneven surfaces activity  did not occur: Safety/medical concerns         Wheelchair     Assist Is the patient using a wheelchair?: Yes Type of Wheelchair: Manual    Wheelchair assist level: Independent Max wheelchair distance: 172ft    Wheelchair 50 feet with 2 turns activity    Assist        Assist Level: Independent   Wheelchair 150 feet activity     Assist      Assist Level: Independent   Blood pressure (!) 147/68, pulse (!) 58, temperature 98.4 F (36.9 C), temperature source Oral, resp. rate 18, height 5\' 2"  (1.575 m), weight 66.7 kg, SpO2 97%.  Medical Problem List and Plan: 1. 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  Antithrombotics: -DVT/anticoagulation:  Pharmaceutical: therapeutic dose lovenox, appreciate pharmacy help -Dopplers with left femoral DVT -pt may gently massage limb for pain purposes -can convert to eliquis prior to discharge as this is covered by pt's insurance. Discussed DVT treatment plan with pt today             -antiplatelet therapy: none   3. Pain Management: Tylenol, oxycodone as needed             -increased gabapentin to 300 mg q HS (for phantom pain) with good results             -continue massage/tactile feedback (gentle for now), vision/mirror therapy  -10/11 will adjust gabapentin to 300 mg 3 times daily, she was only getting gabapentin at bedtime previously  10/12: using PRN oxycodone consistently; ok gabapentin benefit, increase to 300/300/600 mg for nightime pain - improvement with increase 10/13  4.  Mood/Behavior/Sleep: LCSW to evaluate and provide emotional support             -history of bipolar: continue Wellbutrin SR 150 mg BID             -antipsychotic agents: n/a   -10/9 mood appears stable, continue current dose of Wellbutrin  -10/10 patient was seen by neuropsychology, appreciate assistance.  Appears to be doing better from this perspective  -Mood has been improving  5. Neuropsych/cognition: This patient is capable of making decisions on her own behalf.             -pt appears very positive and motivated to regain functional mobility   6. Skin/Wound Care: Routine skin care checks             -  local dressing to incision, change daily prn.  - healing well             -foam dressing to abrasions on anterior thigh  -shrinker sock left AKA ordered   7. Fluids/Electrolytes/Nutrition: Routine Is and Os and follow-up chemistries             -continue Vit C, zinc supplements   -hypokalemia--kdur daily   -10/7 increase kdur to BID for K+ 3.2  -10/8 potassium up to 3.6  -10/10 potassium up to 3.9, will decrease K-Dur back down to 20 mEq daily  Recheck K+ monday  -added protein supp for low albumin  8: Hypertension: monitor TID and prn             -continue amlodipine 10 mg daily             -continue hydralazine 25 mg BID   -10/8 Increase hydralazine to 25 mg 3 times daily  -10/11 Increase hydralazine to 50mg  TID - monitor 1 day before further increase  -10/13: increase hydralazine to 75 mg Q8H    11/22/2022    2:35 AM 11/21/2022    7:56 PM 11/21/2022    1:55 PM  Vitals with BMI  Systolic 147 144 540  Diastolic 68 75 78  Pulse 58 77 73    9: Hyperlipidemia: continue statin   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 Renold Don   Vanc trough 12 today   12: History of breast cancer on Arimadex 1 mg daily    14: Anemia: acute blood loss  atop anemia chronic disease             -CBC with significant drop to 7.6 10/5-->f/u 10/6 stable at 7.7  -continue iron supplement  -check stool for ob x1 although unlikely  -10/10 improved to 8.1  Recheck monday   15: GERD: continue PPI      LOS: 9 days A FACE TO FACE EVALUATION WAS PERFORMED  Angelina Sheriff 11/22/2022, 11:21 AM

## 2022-11-22 NOTE — Progress Notes (Signed)
Reports phantom pain some better after increased HS dose of neurontin. PRN oxy ir given at 2026 & 0233. Scheduled HS senna s held per patient's request. Using female urinal independently. Encouraged by progress. Excited about upcoming discharge. Alfredo Martinez A

## 2022-11-22 NOTE — Progress Notes (Signed)
Physical Therapy Session Note  Patient Details  Name: Tammy [name] MRN: [id number] Date of Birth: October 20, 1955  Today's Date: 11/22/2022 PT Individual Time: 0930-1043 PT Individual Time Calculation (min): 73 min   Short Term Goals: Week 1:  PT Short Term Goal 1 (Week 1): pt will transfer bed<>chair with LRAD and supervision PT Short Term Goal 2 (Week 1): pt will transfer sit<>stand with LRAD and supervision PT Short Term Goal 3 (Week 1): pt will perform car transfer with LRAD and CGA   Skilled Therapeutic Interventions/Progress Updates:    Session focused grad day activities and preparation for d/c. Answered pt questions and education on importance of HEP, ROM, prosthetic process, phantom pain/sensation, as well as education in regards to peer support and support group (pt reports already having info on each of these programs and interested in following up). Focused session on functional transfer, gait training, and core strengthening exercises. Pt propells w/c on unit with BUE mod I and occasionally using RLE as well for overall strengthening and endurance. Performed basic squat pivot transfers modified independent using rails and managing w/c parts. Performed simulated car transfer to SUV height for home practice with supervision. Declined stair negotiation due to ramp being installed but had performed at min assist level prior with PT. Focused on gait with RW x 30' today with supervision and cues for technique and positioning of RW for improved alignemnt. Instructed in seated core strengthening exercises with 3# weighted medicine ball x 10 reps each for diagonals, trunk rotation and modified crunches. Also engaged in pec stretch and hip extension stretching education.   Therapy Documentation Precautions:  Precautions Precautions: Fall Precaution Comments: L AKA Required Braces or Orthoses: Other Brace Other Brace: L LE grey Shinker Restrictions Weight Bearing Restrictions:  Yes LLE Weight Bearing: Non weight bearing    Pain: Denies pain.     Therapy/Group: Individual Therapy  Karolee Stamps Darrol Poke, PT, DPT, CBIS  11/22/2022, 12:18 PM

## 2022-11-23 ENCOUNTER — Other Ambulatory Visit (HOSPITAL_COMMUNITY): Payer: Self-pay

## 2022-11-23 LAB — BASIC METABOLIC PANEL
Anion gap: 11 (ref 5–15)
BUN: 15 mg/dL (ref 8–23)
CO2: 24 mmol/L (ref 22–32)
Calcium: 9.5 mg/dL (ref 8.9–10.3)
Chloride: 104 mmol/L (ref 98–111)
Creatinine, Ser: 0.86 mg/dL (ref 0.44–1.00)
GFR, Estimated: >60 mL/min (ref >60)
Glucose, Bld: 107 mg/dL — ABNORMAL HIGH (ref 70–99)
Potassium: 3.8 mmol/L (ref 3.5–5.1)
Sodium: 139 mmol/L (ref 135–145)

## 2022-11-23 LAB — CBC
HCT: 28.8 % — ABNORMAL LOW (ref 36.0–46.0)
Hemoglobin: 8.8 g/dL — ABNORMAL LOW (ref 12.0–15.0)
MCH: 26.7 pg (ref 26.0–34.0)
MCHC: 30.6 g/dL (ref 30.0–36.0)
MCV: 87.5 fL (ref 80.0–100.0)
Platelets: 353 10*3/uL (ref 150–400)
RBC: 3.29 MIL/uL — ABNORMAL LOW (ref 3.87–5.11)
RDW: 15.9 % — ABNORMAL HIGH (ref 11.5–15.5)
WBC: 3.8 10*3/uL — ABNORMAL LOW (ref 4.0–10.5)
nRBC: 0 % (ref 0.0–0.2)

## 2022-11-23 MED ORDER — OXYCODONE HCL 10 MG PO TABS
10.0000 mg | ORAL_TABLET | ORAL | 0 refills | Status: DC | PRN
  Filled 2022-11-23: qty 30, 4d supply, fill #0

## 2022-11-23 MED ORDER — APIXABAN 5 MG PO TABS
5.0000 mg | ORAL_TABLET | Freq: Two times a day (BID) | ORAL | 3 refills | Status: DC
Start: 2022-11-23 — End: 2023-02-21
  Filled 2022-11-23: qty 60, 30d supply, fill #0

## 2022-11-23 MED ORDER — HYDRALAZINE HCL 25 MG PO TABS
75.0000 mg | ORAL_TABLET | Freq: Three times a day (TID) | ORAL | 0 refills | Status: DC
  Filled 2022-11-23: qty 270, 30d supply, fill #0

## 2022-11-23 MED ORDER — VANCOMYCIN IV (FOR PTA / DISCHARGE USE ONLY): 750.0000 mg | Freq: Two times a day (BID) | INTRAVENOUS | 0 refills | Status: DC

## 2022-11-23 MED ORDER — GABAPENTIN 300 MG PO CAPS: 600.0000 mg | ORAL_CAPSULE | Freq: Every day | ORAL | Status: DC

## 2022-11-23 MED ORDER — GABAPENTIN 300 MG PO CAPS
ORAL_CAPSULE | ORAL | 0 refills | Status: DC
  Filled 2022-11-23: qty 120, 30d supply, fill #0

## 2022-11-23 MED ORDER — SODIUM CHLORIDE 0.9 % IV SOLN: 2.0000 g | INTRAVENOUS | Status: DC

## 2022-11-23 MED ORDER — ACETAMINOPHEN 325 MG PO TABS: 650.0000 mg | ORAL_TABLET | Freq: Four times a day (QID) | ORAL | Status: DC | PRN

## 2022-11-23 MED ORDER — HEPARIN SOD (PORK) LOCK FLUSH 100 UNIT/ML IV SOLN
250.0000 [IU] | INTRAVENOUS | Status: AC | PRN
  Administered 2022-11-23: 250 [IU]

## 2022-11-23 MED ORDER — MELATONIN 5 MG PO TABS: 5.0000 mg | ORAL_TABLET | Freq: Every day | ORAL | Status: DC

## 2022-11-23 MED ORDER — VANCOMYCIN HCL 750 MG/150ML IV SOLN: 750.0000 mg | Freq: Two times a day (BID) | INTRAVENOUS | Status: DC

## 2022-11-23 NOTE — Progress Notes (Signed)
Inpatient Rehabilitation Care Coordinator Discharge Note   Patient Details  Name: Allora Bains MRN: [id number] Date of Birth: Jan 30, 1956   Discharge location: HOME WITH [name] AND OTHER FAMILY MEMBERS TO ASSIST  Length of Stay: 10 days  Discharge activity level: MOD/I WHEELCHAIR LEVEL  Home/community participation: ACTIVE  Patient response NW:[id number] Literacy - How often do you need to have someone help you when you read instructions, pamphlets, or other written material from your doctor or pharmacy?: Never  Patient response [id number] Isolation - How often do you feel lonely or isolated from those around you?: Rarely  Services provided included: MD, RD, PT, OT, RN, CM, TR, Pharmacy, Neuropsych, SW  Financial Services:  Field seismologist Utilized: Private Insurance [employer]  Choices offered to/list presented to: PT  Follow-up services arranged:  Home Health, DME, Patient/Family request agency HH/DME Home Health Agency: [facility]  PT  OT  RN    DME : [facility]  [facility] HH/DME Requested Agency: ACTIVE WITH [facility] AND HAS EQUIPMENT FROM [facility] HAS ALL OTHER DME FROM PAST ADMISSIONS  Patient response to transportation need: Is the patient able to respond to transportation needs?: Yes In the past 12 months, has lack of transportation kept you from medical appointments or from getting medications?: No In the past 12 months, has lack of transportation kept you from meetings, work, or from getting things needed for daily living?: No   Patient/Family verbalized understanding of follow-up arrangements:  Yes  Individual responsible for coordination of the follow-up plan: SELF AND [name] [phone]  Confirmed correct DME delivered: Lucy Chris 11/23/2022    Comments (or additional information):PT DID WELL AND IS USED TO USING A WHEELCHAIR DUE TO WAS USING ONE PRIOR TO ADMISSION.  Summary of Stay    Date/Time Discharge Planning CSW   11/18/22 0912 Home with [name]-fiance who is retired but does do odd jobs at times will be alone. Pt was managing prior to admission due to pain in leg and unable to use RGD       Mery Guadalupe, Lemar Livings

## 2022-11-23 NOTE — Discharge Summary (Signed)
Physician Discharge Summary  Patient ID: Tammy [name] MRN: [id number] DOB/AGE: 09-03-1955 67 y.o.  Admit date: 11/13/2022 Discharge date: 11/23/2022  Discharge Diagnoses:  Principal Problem:   Left above-knee amputee [facility]) Active Problems:   Bipolar 1 disorder, depressed, partial remission (HCC) Phantom pain Hypertension Hardware associated osteomyelitits History of breast cancer Hypokalemia Anemia GERD Left femoral DVT  Discharged Condition: good  Significant Diagnostic Studies: VAS [place] LOWER EXTREMITY VENOUS (DVT)  Result Date: 11/17/2022  Lower Venous DVT Study Patient Name:  Tammy [name]  Date of Exam:   11/14/2022 Medical Rec #: [id number]                 Accession #:    [id number] Date of Birth: 12/21/1955                  Patient Gender: F Patient Age:   61 years Exam Location:  [facility] Procedure:      VAS [place] LOWER EXTREMITY VENOUS (DVT) Referring Phys: Wendi Maya --------------------------------------------------------------------------------  Indications: In rehabilitation status post left AKA.  Risk Factors: Immobility. Anticoagulation: Lovenox. Comparison Study: Prior negative left LEV done 07/14/22 Performing Technologist: Sherren Kerns RVS  Examination Guidelines: A complete evaluation includes B-mode imaging, spectral Doppler, color Doppler, and power Doppler as needed of all accessible portions of each vessel. Bilateral testing is considered an integral part of a complete examination. Limited examinations for reoccurring indications may be performed as noted. The reflux portion of the exam is performed with the patient in reverse Trendelenburg.  +---------+---------------+---------+-----------+----------+--------------+ RIGHT    CompressibilityPhasicitySpontaneityPropertiesThrombus Aging +---------+---------------+---------+-----------+----------+--------------+ CFV      Full           Yes      Yes                                  +---------+---------------+---------+-----------+----------+--------------+ SFJ      Full                                                        +---------+---------------+---------+-----------+----------+--------------+ FV Prox  Full                                                        +---------+---------------+---------+-----------+----------+--------------+ FV Mid   Full                                                        +---------+---------------+---------+-----------+----------+--------------+ FV DistalFull                                                        +---------+---------------+---------+-----------+----------+--------------+ PFV      Full                                                        +---------+---------------+---------+-----------+----------+--------------+  POP      Full           Yes      Yes                                 +---------+---------------+---------+-----------+----------+--------------+ PTV      Full                                                        +---------+---------------+---------+-----------+----------+--------------+ PERO     Full                                                        +---------+---------------+---------+-----------+----------+--------------+   +---------+---------------+---------+-----------+----------+--------------+ LEFT     CompressibilityPhasicitySpontaneityPropertiesThrombus Aging +---------+---------------+---------+-----------+----------+--------------+ CFV      Full           Yes      Yes                                 +---------+---------------+---------+-----------+----------+--------------+ SFJ      Full                                                        +---------+---------------+---------+-----------+----------+--------------+ FV Prox  Full                                                         +---------+---------------+---------+-----------+----------+--------------+ FV Mid   Full                                                        +---------+---------------+---------+-----------+----------+--------------+ FV DistalFull                                                        +---------+---------------+---------+-----------+----------+--------------+ PFV      Full                                                        +---------+---------------+---------+-----------+----------+--------------+ POP      Full           Yes      Yes                                 +---------+---------------+---------+-----------+----------+--------------+  PTV      Full                                                        +---------+---------------+---------+-----------+----------+--------------+ PERO     Full                                                        +---------+---------------+---------+-----------+----------+--------------+     Summary: BILATERAL: - No evidence of deep vein thrombosis seen in the lower extremities, bilaterally. -No evidence of popliteal cyst, bilaterally.   *See table(s) above for measurements and observations. Electronically signed by Lemar Livings MD on 11/17/2022 at 6:01:01 PM.    Final     Was notifed by vascular lab that dopplers are positive for DVT involving the femoral vein but not the deep or common femoral veins. Will begin treatment dose lovenox. Pharmacy consult placed. Continue bedrest through tomorrow.    Ranelle Oyster, MD, [facility] Physical Medicine & Rehabilitation Medical Director Rehabilitation Services 11/14/2022    Labs:  Basic Metabolic Panel: Recent Labs  Lab 11/17/22 0408 11/19/22 0340 11/23/22 0546  NA 139 138 139  K 3.6 3.9 3.8  CL 103 103 104  CO2 26 26 24   GLUCOSE 96 98 107*  BUN 10 11 15   CREATININE 0.60 0.76 0.86  CALCIUM 9.2 9.4 9.5    CBC: Recent Labs  Lab 11/19/22 0340  11/23/22 0546  WBC 5.0 3.8*  HGB 8.1* 8.8*  HCT 26.1* 28.8*  MCV 88.2 87.5  PLT 358 353    Brief HPI:   Tammy [name] is a 67 y.o. female who underwent open reduction of her distal femur fracture April 10, 2021. Patient eventually had the hardware removed in March 2024 with irrigation and debridement with Dr. [name] 3 separate times as well as distal femur replacement with total knee arthroplasty. Patient has undergone 2 wound debridements and application of myriad with Dr. Ulice Bold on May 6 and August 5. She continued to have purulent drainage in the wound vac; and the wound did not heal with significant pain in the infected area. Skin graft trial previously from plastic surgery failed as well. The hardware remained exposed. She underwent left AKA by Dr. [name] on 11/04/2022. Wound VAC removed 10/02. The amputation site wound cultures remain negative. She will continue on vancomycin and ceftriaxone through 11/05 and follow with ID. Tolerating diet. She continues to progress ambulation in the hallway with RW CGA/Min A.    Hospital Course: Rovenia Minervini was admitted to rehab 11/13/2022 for inpatient therapies to consist of PT, ST and OT at least three hours five days a week. Past admission physiatrist, therapy team and rehab RN have worked together to provide customized collaborative inpatient rehab. Phantom pain addressed with increase in gabapentin dosing. On 10/05, was notifed by vascular lab that dopplers are positive for DVT involving the femoral vein but not the deep or common femoral veins. Treatment dose Lovenox started. Pharmacy consult placed. Continue bedrest through 10/06. Increased K-dur from 10 to 20 mEq on 10/07. Neuropsychology consultation on 10/09. Tolerating shrinker sock better. Oral iron continued and hemoglobin showed slow improvement. No  results entered for hemoccult stool as ordered on admission. No evidence of bleeding or blood loss. Follow-up labs with normal  potassium level. Will continue 10 mEq daily at discharge. Convert from Lovenox to Eliquis for DVT treatment. Hemoglobin continues to improve. Continue prenatal home vitamin which contains iron.    Blood pressures were monitored on TID basis and amlodipine 10 mg daily and hydralazine 25 mg BID continued. 10/8 Increase hydralazine to 25 mg 3 times daily. Increased further to 37.2 mg TID on 10/10. Increased again on 10/11 to 50 mg TID. Increased again on 10/13 to 75 mg TID.   Rehab course: During patient's stay in rehab weekly team conferences were held to monitor patient's progress, set goals and discuss barriers to discharge. At admission, patient required mod with basic self-care skills and IADL and min with mobility.  She has had improvement in activity tolerance, balance, postural control as well as ability to compensate for deficits. She has had improvement in functional use RUE/LUE  and RLE/LLE as well as improvement in awareness. ession on functional transfer, gait training, and core strengthening exercises. Pt propells w/c on unit with BUE mod I and occasionally using RLE as well for overall strengthening and endurance. Performed basic squat pivot transfers modified independent using rails and managing w/c parts. Performed simulated car transfer to SUV height for home practice with supervision. Declined stair negotiation due to ramp being installed but had performed at min assist level prior with PT. Focused on gait with RW x 30' today with supervision and cues for technique and positioning of RW for improved alignemnt. Instructed in seated core strengthening exercises with 3# weighted medicine ball x 10 reps each for diagonals, trunk rotation and modified crunches. Also engaged in pec stretch and hip extension stretching education.   Home health PT, OT and RN [facility]  Disposition: Discharge disposition: 06-Home-Health Care Svc     Diet: heart healthy  Special  Instructions: No driving, alcohol consumption or tobacco use.  30-35 minutes were spent on discharge planning and discharge summary.  Discharge Instructions     [facility] pharmacist to adjust dose for Vancomycin, Aminoglycosides and other anti-infective therapies as requested by physician.   Complete by: As directed    [facility] to provide Cath Flo 2mg    Complete by: As directed    Administer for PICC line occlusion and as ordered by physician for other access device issues.   Ambulatory referral to Physical Medicine Rehab   Complete by: As directed    Anaphylaxis Kit: Provided to treat any anaphylactic reaction to the medication being provided to the patient if First Dose or when requested by physician   Complete by: As directed    Epinephrine 1mg /ml vial / amp: Administer 0.3mg  (0.17ml) subcutaneously once for moderate to severe anaphylaxis, nurse to call physician and pharmacy when reaction occurs and call 911 if needed for immediate care   Diphenhydramine 50mg /ml IV vial: Administer 25-50mg  IV/IM PRN for first dose reaction, rash, itching, mild reaction, nurse to call physician and pharmacy when reaction occurs   Sodium Chloride 0.9% NS IV: Administer if needed for hypovolemic blood pressure drop or as ordered by physician after call to physician with anaphylactic reaction   Change dressing on IV access line weekly and PRN   Complete by: As directed    Discharge patient   Complete by: As directed    Discharge disposition: 06-Home-Health Care Svc   Discharge patient date: 11/23/2022  Flush IV access with Sodium Chloride 0.9% and Heparin 10 units/ml or 100 units/ml   Complete by: As directed    Home infusion instructions - [facility]   Complete by: As directed    Instructions: Flush IV access with Sodium Chloride 0.9% and Heparin 10units/ml or 100units/ml   Change dressing on IV access line: Weekly and PRN   Instructions Cath Flo 2mg :  Administer for PICC Line occlusion and as ordered by physician for other access device   [facility] pharmacist to adjust dose for: Vancomycin, Aminoglycosides and other anti-infective therapies as requested by physician   Method of administration may be changed at the discretion of home infusion pharmacist based upon assessment of the patient and/or caregiver's ability to self-administer the medication ordered   Complete by: As directed       Allergies as of 11/23/2022       Reactions   Lisinopril Cough        Medication List     STOP taking these medications    cefTRIAXone IVPB Commonly known as: ROCEPHIN   meloxicam 15 MG tablet Commonly known as: MOBIC   oxyCODONE-acetaminophen 5-325 MG tablet Commonly known as: PERCOCET/ROXICET       TAKE these medications    acetaminophen 325 MG tablet Commonly known as: Tylenol Take 2 tablets (650 mg total) by mouth every 6 (six) hours as needed. What changed:  medication strength how much to take when to take this reasons to take this   amLODipine 10 MG tablet Commonly known as: NORVASC Take 1 tablet (10 mg total) by mouth daily.   anastrozole 1 MG tablet Commonly known as: ARIMIDEX TAKE 1 TABLET BY MOUTH DAILY   BENGAY EX Apply 1 Application topically daily as needed (pain).   buPROPion 150 MG 12 hr tablet Commonly known as: WELLBUTRIN SR Take 150 mg by mouth 2 (two) times daily.   calcium carbonate 1250 (500 Ca) MG tablet Commonly known as: OS-CAL - dosed in mg of elemental calcium Take 1 tablet by mouth daily.   cefTRIAXone 2 g in sodium chloride 0.9 % 100 mL Inject 2 g into the vein daily.   Eliquis 5 MG Tabs tablet Generic drug: apixaban Take 1 tablet (5 mg total) by mouth 2 (two) times daily.   gabapentin 300 MG capsule Commonly known as: NEURONTIN Take 1 capsule (300 mg total) by mouth 2 (two) times daily AND 2 capsules (600 mg total) at bedtime. What changed:  medication strength See  the new instructions.   hydrALAZINE 25 MG tablet Commonly known as: APRESOLINE Take 3 tablets (75 mg total) by mouth every 8 (eight) hours. What changed:  how much to take when to take this   melatonin 5 MG Tabs Take 1 tablet (5 mg total) by mouth at bedtime.   Oxycodone HCl 10 MG Tabs Take 1-1.5 tablets (10-15 mg total) by mouth every 4 (four) hours as needed for up to 7 days for severe pain (pain score 7-10).   pantoprazole 40 MG tablet Commonly known as: PROTONIX Take 1 tablet (40 mg total) by mouth daily.   potassium chloride 10 MEQ tablet Commonly known as: KLOR-CON Take 10 mEq by mouth daily.   PRENATAL PO Take 1 tablet by mouth daily.   promethazine 12.5 MG tablet Commonly known as: PHENERGAN Take 1 tablet (12.5 mg total) by mouth every 8 (eight) hours as needed for nausea or vomiting.   vancomycin IVPB Inject 750 mg into the vein every 12 (  twelve) hours. Indication:  osteomyelitis s/p AKA with retained hardware First Dose: No Last Day of Therapy:  12/15/22 Labs - "Sunday/Monday:  CBC/D, BMP, and vancomycin trough. Labs - Thursday:  BMP and vancomycin trough Labs - Once weekly: ESR and CRP Method of administration:Elastomeric Method of administration may be changed at the discretion of the patient and/or caregiver's ability to self-administer the medication ordered.   VITAMIN B-12 PO Take 2,000 mcg by mouth daily.   vitamin C 1000 MG tablet Take 1,000 mg by mouth daily.   zinc gluconate 50 MG tablet Take 50 mg by mouth daily.               Discharge Care Instructions  (From admission, onward)           Start     Ordered   11/23/22 0000  Change dressing on IV access line weekly and PRN  (Home infusion instructions - [facility] )        10" /14/24 0960            Follow-up Information     Fanny Dance, MD Follow up.   Specialty: Physical Medicine and Rehabilitation Why: office will call you to arrange your appt  (sent) Contact information: [address] [place] [address] [phone]         Nadara Mustard, MD Follow up.   Specialty: Orthopedic Surgery Why: Call the office in 1-2 days to make arrangements for hosptial follow-up appointment. Contact information: [address] [place] [address] [phone]         Quitman Livings, MD Follow up.   Specialty: Internal Medicine Why: Call the office in 1-2 days to make arrangements for hosptial follow-up appointment. Contact information: [address], [address] [place] [address] [phone]         Malachy Mood, MD Follow up.   Specialties: Hematology, Oncology Contact information: [address] [place] [address] [phone]         Raymondo Band, MD. Go to.   Specialty: Infectious Diseases Contact information: [address] [place] [address] [place] [address] [phone]                 Signed: Milinda Antis 11/23/2022, 10:29 AM

## 2022-11-23 NOTE — Progress Notes (Signed)
PROGRESS NOTE   Subjective/Complaints:  Patient is looking forward to going home.  She reports phantom pain continues to be improved with gabapentin.  No new concerns or complaints   ROS: Positives per HPI above. Denies fevers, chills, N/V, headaches, Abdominal pain, SOB, chest pain, new weakness or paraesthesias.      Objective:   No results found. Recent Labs    11/23/22 0546  WBC 3.8*  HGB 8.8*  HCT 28.8*  PLT 353    Recent Labs    11/23/22 0546  NA 139  K 3.8  CL 104  CO2 24  GLUCOSE 107*  BUN 15  CREATININE 0.86  CALCIUM 9.5     Intake/Output Summary (Last 24 hours) at 11/23/2022 1003 Last data filed at 11/23/2022 0758 Gross per 24 hour  Intake 750.7 ml  Output 1000 ml  Net -249.3 ml        Physical Exam: Vital Signs Blood pressure (!) 144/72, pulse 69, temperature 98.1 F (36.7 C), temperature source Oral, resp. rate 18, height 5\' 2"  (1.575 m), weight 66.7 kg, SpO2 95%.  Constitutional: No distress . Vital signs reviewed.  Sitting in bedside wheelchair. HEENT: NCAT, EOMI, oral membranes moist Neck: supple, no JVD. Cardiovascular: RRR without murmur. No JVD    Respiratory/Chest: CTA Bilaterally without wheezes or rales. Normal effort    GI/Abdomen: BS +, non-tender, non-distended Ext: no clubbing, cyanosis, + LLE edema -unchanged Psych: Very pleasant Skin: Wearing shrinker, Surgical site well approximated, no active drainage Neuro:   AAOx3  No apparent cognitive deficits  Moving all 4 extremities antigravity and against resistance No abnormal tone CN 2-12 grossly intact   Prior exam:   Alert and oriented x 3 Follows commands. . Normal insight and awareness. Intact Memory. Normal language and speech. Cranial nerve exam unremarkable. MMT: UE 5/5. RLE 5/5. LLE limited by pain and 4-/5 HF. Sensory exam normal for light touch and pain in all 4 limbs. No limb ataxia or cerebellar signs. No  abnormal tone appreciated.    Musculoskeletal: left thigh swelling and tenderness improved from several days prior     Assessment/Plan: 1. Functional deficits which require 3+ hours per day of interdisciplinary therapy in a comprehensive inpatient rehab setting. Physiatrist is providing close team supervision and 24 hour management of active medical problems listed below. Physiatrist and rehab team continue to assess barriers to discharge/monitor patient progress toward functional and medical goals  Care Tool:  Bathing    Body parts bathed by patient: Right arm, Left arm, Chest, Abdomen, Front perineal area, Buttocks, Right upper leg, Left upper leg, Right lower leg, Face     Body parts n/a: Left lower leg   Bathing assist Assist Level: Independent with assistive device     Upper Body Dressing/Undressing Upper body dressing   What is the patient wearing?: Pull over shirt    Upper body assist Assist Level: Independent with assistive device    Lower Body Dressing/Undressing Lower body dressing      What is the patient wearing?: Underwear/pull up, Pants     Lower body assist Assist for lower body dressing: Independent with assitive device     Toileting Toileting  Toileting assist Assist for toileting: Independent with assistive device     Transfers Chair/bed transfer  Transfers assist     Chair/bed transfer assist level: Independent with assistive device Chair/bed transfer assistive device: Armrests   Locomotion Ambulation   Ambulation assist      Assist level: Supervision/Verbal cueing Assistive device: Walker-rolling Max distance: 30'   Walk 10 feet activity   Assist     Assist level: Supervision/Verbal cueing Assistive device: Walker-rolling   Walk 50 feet activity   Assist Walk 50 feet with 2 turns activity did not occur: Safety/medical concerns (endurance)         Walk 150 feet activity   Assist Walk 150 feet activity did not  occur: Safety/medical concerns         Walk 10 feet on uneven surface  activity   Assist Walk 10 feet on uneven surfaces activity did not occur: Safety/medical concerns         Wheelchair     Assist Is the patient using a wheelchair?: Yes Type of Wheelchair: Manual    Wheelchair assist level: Independent Max wheelchair distance: 117ft    Wheelchair 50 feet with 2 turns activity    Assist        Assist Level: Independent   Wheelchair 150 feet activity     Assist      Assist Level: Independent   Blood pressure (!) 144/72, pulse 69, temperature 98.1 F (36.7 C), temperature source Oral, resp. rate 18, height 5\' 2"  (1.575 m), weight 66.7 kg, SpO2 95%.  Medical Problem List and Plan: 1. 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  Antithrombotics: -DVT/anticoagulation:  Pharmaceutical: therapeutic dose lovenox, appreciate pharmacy help -Dopplers with left femoral DVT -pt may gently massage limb for pain purposes -can convert to eliquis prior to discharge as this is covered by pt's insurance. Discussed DVT treatment plan with pt today             -antiplatelet therapy: none   3. Pain Management: Tylenol, oxycodone as needed             -increased gabapentin to 300 mg q HS (for phantom pain) with good results             -continue massage/tactile feedback (gentle for now), vision/mirror therapy  -10/11 will adjust gabapentin to 300 mg 3 times daily, she was only getting gabapentin at bedtime previously  10/12: using PRN oxycodone  consistently; ok gabapentin benefit, increase to 300/300/600 mg for nightime pain - improvement with increase 10/13  -10/14 Phantom pain improved with gabapentin increase, continue current regimen  4. Mood/Behavior/Sleep: LCSW to evaluate and provide emotional support             -history of bipolar: continue Wellbutrin SR 150 mg BID             -antipsychotic agents: n/a   -10/9 mood appears stable, continue current dose of Wellbutrin  -10/10 patient was seen by neuropsychology, appreciate  assistance.  Appears to be doing better from this perspective  -Mood has been improving  5. Neuropsych/cognition: This patient is capable of making decisions on her own behalf.             -pt appears very positive and motivated to regain functional mobility   6. Skin/Wound Care: Routine skin care checks             -local dressing to incision, change daily prn.  - healing well             -foam dressing to abrasions on anterior thigh  -shrinker sock left AKA ordered  -tolerating shrinker use better now   7. Fluids/Electrolytes/Nutrition: Routine Is and Os and follow-up chemistries             -continue Vit C, zinc supplements   -hypokalemia--kdur daily   -10/7 increase kdur to BID for K+ 3.2  -10/8 potassium up to 3.6  -10/10 potassium up to 3.9, will decrease K-Dur back down to 20 mEq daily  -10/14 K+ stable at 3.8, continue current dose of kdur  -added protein supp for low albumin  8: Hypertension: monitor TID and prn             -continue amlodipine 10 mg daily             -continue hydralazine 25 mg BID   -10/8 Increase hydralazine to 25 mg 3 times daily  -10/11 Increase hydralazine to 50mg  TID - monitor 1 day before further increase  -10/13: increase hydralazine to 75 mg Q8H  -f/u with PCP for further BP monitoring    11/23/2022    4:56 AM 11/22/2022    7:42 PM 11/22/2022    2:48 PM  Vitals with BMI  Systolic 144 157 161  Diastolic 72 78 73  Pulse 69 75 72    9:  Hyperlipidemia: continue statin   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 Renold Don   Vanc trough 12 today   12: History of breast cancer on Arimadex 1 mg daily    14: Anemia: acute blood loss atop anemia chronic disease             -CBC with significant drop to 7.6 10/5-->f/u 10/6 stable at 7.7  -continue iron supplement  -check stool for ob x1 although unlikely  -10/10 improved to 8.1  -10/14 HGB sable at 8.8 today   15: GERD: continue PPI      LOS: 10 days A FACE TO FACE EVALUATION WAS PERFORMED  Fanny Dance 11/23/2022, 10:03 AM

## 2022-11-23 NOTE — Progress Notes (Signed)
+/-   sleep. PRN oxy ir 10mg 's given at 1941, 0138, & 68. Mostly rates pain a 6 on pain scale. PRN robaxin given at 0516. Telfa dressing in place to left AKA. Patient able to don and doff stump shrinker. Using female urinal. LBM 10/11. Tammy [name] A

## 2022-11-23 NOTE — Progress Notes (Signed)
Inpatient Rehabilitation Discharge Medication Review by a Pharmacist  A complete drug regimen review was completed for this patient to identify any potential clinically significant medication issues.  High Risk Drug Classes Is patient taking? Indication by Medication  Antipsychotic Yes Promethazine- N/V  Anticoagulant Yes Apixaban- LLE fem DVT  Antibiotic Yes, as an intravenous medication Rocephin, vanco- osteo  Opioid Yes oxyIR- acute pain  Antiplatelet No   Hypoglycemics/insulin No   Vasoactive Medication Yes Norvasc, hydralazine- HTN  Chemotherapy Yes, Oral Chemotherapy Arimidex- Hx breast CA  Other Yes Wellbutrin- MDD Gabapentin- neuropathic pain Melatonin- sleep Protonix- GERD     Type of Medication Issue Identified Description of Issue Recommendation(s)  Drug Interaction(s) (clinically significant)     Duplicate Therapy     Allergy     No Medication Administration End Date     Incorrect Dose     Additional Drug Therapy Needed     Significant med changes from prior encounter (inform family/care partners about these prior to discharge).    Other       Clinically significant medication issues were identified that warrant physician communication and completion of prescribed/recommended actions by midnight of the next day:  No   Time spent performing this drug regimen review (minutes):  30   Guhan Bruington BS, PharmD, BCPS Clinical Pharmacist 11/23/2022 9:34 AM  Contact: [phone] after 3 PM  "Be curious, not judgmental..." -Debbora Dus

## 2022-11-25 ENCOUNTER — Telehealth: Payer: Self-pay

## 2022-11-25 NOTE — Telephone Encounter (Signed)
Transition Care Management Unsuccessful Follow-up Telephone Call  Date of discharge and from where:  11/23/22  Attempts:  1st Attempt  Reason for unsuccessful TCM follow-up call:  Left voice message

## 2022-11-25 NOTE — Telephone Encounter (Signed)
Transitional Care call--patient    Are you/is patient experiencing any problems since coming home? NO Are there any questions regarding any aspect of care? NO Are there any questions regarding medications administration/dosing? NO Are meds being taken as prescribed? YES Patient should review meds with caller to confirm Have there been any falls? No Has Home Health been to the house and/or have they contacted you? YES If not, have you tried to contact them? Can we help you contact them? Are bowels and bladder emptying properly? YES Are there any unexpected incontinence issues? NO If applicable, is patient following bowel/bladder programs? Any fevers, problems with breathing, unexpected pain? NO Are there any skin problems or new areas of breakdown? NO Has the patient/family member arranged specialty MD follow up (ie cardiology/neurology/renal/surgical/etc)? YES  Can we help arrange? Does the patient need any other services or support that we can help arrange? NO Are caregivers following through as expected in assisting the patient? YES Has the patient quit smoking, drinking alcohol, or using drugs as recommended? YES  Appointment time 10: 40 on 10/28, arrive time 10:20 with Dr. Natale Lay [address] [phone]

## 2022-11-27 ENCOUNTER — Encounter: Payer: Self-pay | Admitting: Physical Medicine & Rehabilitation

## 2022-11-27 ENCOUNTER — Encounter: Payer: Medicare Other | Attending: Physical Medicine & Rehabilitation | Admitting: Physical Medicine & Rehabilitation

## 2022-11-27 VITALS — BP 130/73 | HR 79 | Ht 62.0 in | Wt 147.0 lb

## 2022-11-27 DIAGNOSIS — G548 Other nerve root and plexus disorders: Secondary | ICD-10-CM

## 2022-11-27 DIAGNOSIS — Z89612 Acquired absence of left leg above knee: Secondary | ICD-10-CM | POA: Diagnosis present

## 2022-11-27 DIAGNOSIS — I82412 Acute embolism and thrombosis of left femoral vein: Secondary | ICD-10-CM | POA: Diagnosis present

## 2022-11-27 DIAGNOSIS — F3170 Bipolar disorder, currently in remission, most recent episode unspecified: Secondary | ICD-10-CM

## 2022-11-27 DIAGNOSIS — R52 Pain, unspecified: Secondary | ICD-10-CM

## 2022-11-27 DIAGNOSIS — I10 Essential (primary) hypertension: Secondary | ICD-10-CM

## 2022-11-27 DIAGNOSIS — E876 Hypokalemia: Secondary | ICD-10-CM | POA: Diagnosis present

## 2022-11-27 MED ORDER — GABAPENTIN 300 MG PO CAPS: 600.0000 mg | ORAL_CAPSULE | Freq: Three times a day (TID) | ORAL | 2 refills | Status: DC

## 2022-11-27 NOTE — Progress Notes (Addendum)
Subjective:    Patient ID: Tammy [name], female    DOB: 04-Oct-1955, 67 y.o.   MRN: [id number]  HPI  Hospital discharge summary 11/23/2022    Brief HPI:   Tammy [name] is a 67 y.o. female who underwent open reduction of her distal femur fracture April 10, 2021. Patient eventually had the hardware removed in March 2024 with irrigation and debridement with Dr. [name] 3 separate times as well as distal femur replacement with total knee arthroplasty. Patient has undergone 2 wound debridements and application of myriad with Dr. Ulice Bold on May 6 and August 5. She continued to have purulent drainage in the wound vac; and the wound did not heal with significant pain in the infected area. Skin graft trial previously from plastic surgery failed as well. The hardware remained exposed. She underwent left AKA by Dr. [name] on 11/04/2022. Wound VAC removed 10/02. The amputation site wound cultures remain negative. She will continue on vancomycin and ceftriaxone through 11/05 and follow with ID. Tolerating diet. She continues to progress ambulation in the hallway with RW CGA/Min A.      Hospital Course: Tammy [name] was admitted to rehab 11/13/2022 for inpatient therapies to consist of PT, ST and OT at least three hours five days a week. Past admission physiatrist, therapy team and rehab RN have worked together to provide customized collaborative inpatient rehab. Phantom pain addressed with increase in gabapentin dosing. On 10/05, was notifed by vascular lab that dopplers are positive for DVT involving the femoral vein but not the deep or common femoral veins. Treatment dose Lovenox started. Pharmacy consult placed. Continue bedrest through 10/06. Increased K-dur from 10 to 20 mEq on 10/07. Neuropsychology consultation on 10/09. Tolerating shrinker sock better. Oral iron continued and hemoglobin showed slow improvement. No results entered for hemoccult stool as ordered on admission.  No evidence of bleeding or blood loss. Follow-up labs with normal potassium level. Will continue 10 mEq daily at discharge. Convert from Lovenox to Eliquis for DVT treatment. Hemoglobin continues to improve. Continue prenatal home vitamin which contains iron.    Blood pressures were monitored on TID basis and amlodipine 10 mg daily and hydralazine 25 mg BID continued. 10/8 Increase hydralazine to 25 mg 3 times daily. Increased further to 37.2 mg TID on 10/10. Increased again on 10/11 to 50 mg TID. Increased again on 10/13 to 75 mg TID.     Interval history 11/27/2022 Patient is here to follow-up after completing debilitation at [facility]. Patient reports she is doing well overall since her discharge.  She is having difficulty with phantom pain during the day and particularly at night.  She continues to use oxycodone for residual limb pain also, she reports she would like to gradually wean off of this over time.  She reports visits with Dr. [name] and Dr. [name] scheduled next week.  Mood is doing okay on Wellbutrin.  Pain Inventory Average Pain 5 Pain Right Now 5 My pain is intermittent and aching  In the last 24 hours, has pain interfered with the following? General activity 10 Relation with others 0 Enjoyment of life 10 What TIME of day is your pain at its worst? night Sleep (in general) Poor  Pain is worse with: standing Pain improves with:  rubbing the area, medication Relief from Meds: 7  use a walker ability to climb steps?  no do you drive?  no transfers alone Do you have any goals in this area?  yes  disabled: date disabled  1993 I need assistance with the following:  dressing, bathing, toileting, meal prep, household duties, and shopping Do you have any goals in this area?  yes  numbness tingling anxiety  Any changes since last visit?  no  Any changes since last visit?  no    Family History  Problem Relation Age of Onset   Breast cancer Mother    Other Mother         blood clots   Diabetes Sister    Neuropathy Sister        diabetic neuropathy   Breast cancer Maternal Aunt        25   Colon cancer Maternal Aunt 85   Diabetes Paternal Aunt    Diabetes Brother    Neuropathy Brother        diabetic neuropathy   Breast cancer Other        PGM's sister; dx mid 4s   Colon cancer Other        PGM's brother; dx 44s   Ovarian cancer Other        PGM's mother; dx unknown age   Esophageal cancer Neg Hx    Rectal cancer Neg Hx    Stomach cancer Neg Hx    Colon polyps Neg Hx    Social History   Socioeconomic History   Marital status: Significant Other    Spouse name: Not on file   Number of children: 2   Years of education: Not on file   Highest education level: Not on file  Occupational History   Occupation: diabled  Tobacco Use   Smoking status: Former    Current packs/day: 1.50    Average packs/day: 1.5 packs/day for 40.0 years (60.0 ttl pk-yrs)    Types: Cigarettes   Smokeless tobacco: Never   Tobacco comments:    has used e-cigarettes, no vaping   Vaping Use   Vaping status: Never Used  Substance and Sexual Activity   Alcohol use: Not Currently   Drug use: No   Sexual activity: Not Currently  Other Topics Concern   Not on file  Social History Narrative   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   Caffeine: 1 cup in the morning    Social Determinants of Health   Financial Resource Strain: Not on file  Food Insecurity: No Food Insecurity (11/11/2022)   Hunger Vital Sign    Worried About Running Out of Food in the Last Year: Never true    Ran Out of Food in the Last Year: Never true  Transportation Needs: No Transportation Needs (11/11/2022)   PRAPARE - Administrator, Civil Service (Medical): No    Lack of Transportation (Non-Medical): No  Physical Activity: Not on file  Stress: Not on file  Social Connections: Not on file   Past Surgical History:  Procedure Laterality Date   AMPUTATION Left  11/04/2022   Procedure: LEFT ABOVE KNEE AMPUTATION;  Surgeon: Nadara Mustard, MD;  Location: [facility];  Service: Orthopedics;  Laterality: Left;   ANKLE FRACTURE SURGERY Left 2018   APPLICATION OF WOUND VAC Left 06/15/2022   Procedure: APPLICATION OF WOUND VAC;  Surgeon: Peggye Form, DO;  Location: [facility];  Service: Plastics;  Laterality: Left;   APPLICATION OF WOUND VAC Left 09/14/2022   Procedure: APPLICATION OF WOUND VAC;  Surgeon: Peggye Form, DO;  Location: [facility] OR;  Service: Plastics;  Laterality: Left;   BREAST LUMPECTOMY Left 07/2020  BREAST LUMPECTOMY WITH RADIOACTIVE SEED AND SENTINEL LYMPH NODE BIOPSY Left 07/26/2020   Procedure: LEFT BREAST LUMPECTOMY WITH RADIOACTIVE SEED AND SENTINEL LYMPH NODE BIOPSY;  Surgeon: Abigail Miyamoto, MD;  Location: [facility];  Service: General;  Laterality: Left;   CESAREAN SECTION  1986   COLONOSCOPY     DIAGNOSTIC LAPAROSCOPY  1985   HARDWARE REMOVAL Left 04/30/2022   Procedure: HARDWARE REMOVAL LEFT FEMUR;  Surgeon: Samson Frederic, MD;  Location: [facility];  Service: Orthopedics;  Laterality: Left;  150   HARDWARE REMOVAL Left 11/04/2022   Procedure: HARDWARE REMOVAL INFECTED LEFT TOTAL KNEE ARTHROPLASTY;  Surgeon: Nadara Mustard, MD;  Location: [facility] OR;  Service: Orthopedics;  Laterality: Left;   I & D KNEE WITH POLY EXCHANGE Left 06/07/2022   Procedure: IRRIGATION AND DEBRIDEMENT KNEE WITH POLY EXCHANGE;  Surgeon: Samson Frederic, MD;  Location: [facility];  Service: Orthopedics;  Laterality: Left;   INCISION AND DRAINAGE OF WOUND Left 06/04/2022   Procedure: IRRIGATION AND DEBRIDEMENT KNEE;  Surgeon: Samson Frederic, MD;  Location: [facility];  Service: Orthopedics;  Laterality: Left;  60   INCISION AND DRAINAGE OF WOUND Left 06/15/2022   Procedure: excision of knee wound with Myriad;  Surgeon: Peggye Form, DO;  Location: [facility];  Service: Plastics;  Laterality: Left;   ORIF FEMUR FRACTURE Left 04/10/2021   Procedure:  OPEN REDUCTION INTERNAL FIXATION (ORIF) DISTAL FEMUR FRACTURE;  Surgeon: Myrene Galas, MD;  Location: [facility] OR;  Service: Orthopedics;  Laterality: Left;   PARTIAL KNEE ARTHROPLASTY Left 04/30/2022   Procedure: DISTAL FEMUR REPLACEMENT;  Surgeon: Samson Frederic, MD;  Location: [facility];  Service: Orthopedics;  Laterality: Left;  150   Past Medical History:  Diagnosis Date   Anemia    hx of   Anxiety    on meds   Arthritis    [name]/bilateral ankles   Bipolar disorder (HCC)    Breast cancer (HCC) 2022   LEFT breast-radiation   Depression    on meds   Family history of breast cancer 07/10/2020   Family history of colon cancer 07/10/2020   Family history of ovarian cancer 07/10/2020   GERD (gastroesophageal reflux disease)    on meds   Heart murmur    dx by PCP   Hepatitis C    HTN (hypertension)    on meds   Hx of radiation therapy    Liver cirrhosis (HCC)    from hep c   Neuromuscular disorder (HCC)    Osteopenia    Personal history of radiation therapy    Stroke (HCC) 09/2012   slurred speech and LEFT sided weakness/notes 10/04/2012   BP 130/73   Pulse 79   Ht 5\' 2"  (1.575 m)   Wt 147 lb (66.7 kg)   SpO2 98%   BMI 26.89 kg/m   Opioid Risk Score:   Fall Risk Score:  `1  Depression screen [facility] 2/9     11/03/2022    3:04 PM  Depression screen PHQ 2/9  Decreased Interest 0  Down, Depressed, Hopeless 0  PHQ - 2 Score 0    Review of Systems  Musculoskeletal:  Positive for gait problem.  Neurological:  Positive for weakness and numbness.       Tingling  Psychiatric/Behavioral:         Anxiety  All other systems reviewed and are negative.      Objective:   Physical Exam   Gen: no distress, normal appearing HEENT: oral mucosa pink  and moist, NCAT Cardio: Reg rate Chest: normal effort, normal rate of breathing Abd: soft, non-distended Ext: L AKA Psych: pleasant, normal affect Skin: L AKA, no signs of infection or drainage noted at incision  Neuro: alert  and awake, follows commands, CN grossly intact Strength 5/5 b/l UE, RLE 5/5 Able to left LLE to gravity Sensory exam normal for LT  Musculoskeletal: No joint swelling or tendereness noted      Assessment & Plan:    1. 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 [name] next week   -Cont PT/OT   2.  Left femoral DVT -Continue eliquis   3. Pain Management:              -increased gabapentin to 600mg  TID for phantom pain   -Consider cymbalta    4. History of bipolar. She reports good well controlled on Wellbutrin SR 150 mg BID             -Denies SI or HI  -F/U with PCP   5. Hypokalemia   -K+ stable on 10/14. F/u PCP    6: Hypertension   -Well controlled today             11/27/2022    2:04 PM 11/23/2022    4:56 AM 11/22/2022    7:42 PM  Vitals with BMI  Height 5\' 2"     Weight 147 lbs    BMI 26.88    Systolic 130 144 416  Diastolic 73 72 78  Pulse 79 69 75    7: Hardware associated osteomyelitis; 6 weeks abx end date 11/05             -continue abx per ID, She reports f/u with ID next week   11/8 called pt, pain doing better with increase in gabapentin She says she does not need refill eliquis, she plans to f/u with PCP for further refills

## 2022-11-30 ENCOUNTER — Telehealth: Payer: Self-pay | Admitting: Orthopedic Surgery

## 2022-11-30 ENCOUNTER — Other Ambulatory Visit: Payer: Self-pay | Admitting: Orthopedic Surgery

## 2022-11-30 ENCOUNTER — Ambulatory Visit (INDEPENDENT_AMBULATORY_CARE_PROVIDER_SITE_OTHER): Payer: Medicare Other | Admitting: Orthopedic Surgery

## 2022-11-30 ENCOUNTER — Encounter: Payer: Self-pay | Admitting: Orthopedic Surgery

## 2022-11-30 DIAGNOSIS — Z89612 Acquired absence of left leg above knee: Secondary | ICD-10-CM

## 2022-11-30 DIAGNOSIS — M86452 Chronic osteomyelitis with draining sinus, left femur: Secondary | ICD-10-CM

## 2022-11-30 MED ORDER — OXYCODONE HCL 10 MG PO TABS: 10.0000 mg | ORAL_TABLET | ORAL | 0 refills | Status: AC | PRN

## 2022-11-30 MED ORDER — OXYCODONE HCL 10 MG PO TABS: 10.0000 mg | ORAL_TABLET | ORAL | 0 refills | Status: DC | PRN

## 2022-11-30 NOTE — Telephone Encounter (Signed)
Pt requested her prescription be sent to [facility] on [address] [place], [place] [address]

## 2022-11-30 NOTE — Telephone Encounter (Signed)
This pt was in the office this morning and the rx was sent to [facility] on [address] she asked if it can go to the [facility] on [address]?

## 2022-11-30 NOTE — Progress Notes (Signed)
Office Visit Note   Patient: Tammy [name]           Date of Birth: Mar 15, 1955           MRN: [id number] Visit Date: 11/30/2022              Requested by: Quitman Livings, MD [address] [place],  [place] [address] PCP: Quitman Livings, MD  Chief Complaint  Patient presents with   Left Leg - Routine Post Op    11/04/2022 left AKA      HPI: Patient is a 67 year old woman who is 4 weeks status post left above-knee amputation for infected hardware and osteomyelitis left knee.  Patient is currently wearing a shrinker.  She is on Neurontin 300 mg 3 times a day and oxycodone.  Assessment & Plan: Visit Diagnoses:  1. 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 Hx of AKA (above knee amputation), left (HCC)     Plan: A prescription was called in for oxycodone.  Half of the staples were removed.  Follow-up in 2 weeks to remove the remaining staples.  Follow-Up Instructions: Return in about 2 weeks (around 12/14/2022).   Ortho Exam  Patient is alert, oriented, no adenopathy, well-dressed, normal affect, normal respiratory effort. Examination incision is well-approximated there is no cellulitis or drainage.  Patient does have swelling.  Patient is a new left transfemoral amputee.  Patient's current comorbidities are not expected to impact the ability to function with the prescribed prosthesis. Patient verbally communicates a strong desire to use a prosthesis. Patient currently requires mobility aids to ambulate without a prosthesis.  Expects not to use mobility aids with a new prosthesis.  Patient is a K2 level ambulator that will use a prosthesis to walk around their home and the community over low level environmental barriers.   Half of the Staples are harvested today.   Imaging: No results found.   Labs: Lab Results  Component Value Date   HGBA1C 5.6 03/17/2022   HGBA1C 5.6 12/01/2018   HGBA1C 5.9 (H) 05/11/2015   ESRSEDRATE 38 (H)  03/17/2022   CRP 9.6 (H) 11/03/2022   CRP 68.5 (H) 09/22/2022   CRP 47.9 (H) 08/25/2022   REPTSTATUS 11/09/2022 FINAL 11/04/2022   GRAMSTAIN NO WBC SEEN NO ORGANISMS SEEN  11/04/2022   CULT  11/04/2022    No growth aerobically or anaerobically. Performed at [facility], [address] [place], [place] [address]    [facility] ENTEROCOCCUS FAECALIS 06/07/2022     Lab Results  Component Value Date   ALBUMIN 2.3 (L) 11/14/2022   ALBUMIN 4.0 10/13/2022   ALBUMIN 3.0 (L) 06/16/2022    Lab Results  Component Value Date   MG 2.0 06/16/2022   MG 2.4 06/15/2022   MG 2.1 06/13/2022   Lab Results  Component Value Date   VD25OH 10.88 (L) 04/11/2021    No results found for: "PREALBUMIN"    Latest Ref Rng & Units 11/23/2022    5:46 AM 11/19/2022    3:40 AM 11/16/2022    4:39 AM  CBC EXTENDED  WBC 4.0 - 10.5 K/uL 3.8  5.0  4.7   RBC 3.87 - 5.11 MIL/uL 3.29  2.96  2.81   Hemoglobin 12.0 - 15.0 g/dL 8.8  8.1  7.8   HCT 40.1 - 46.0 % 28.8  26.1  24.7   Platelets 150 - 400 K/uL 353  358  357      There  is no height or weight on file to calculate BMI.  Orders:  No orders of the defined types were placed in this encounter.  Meds ordered this encounter  Medications   Oxycodone HCl 10 MG TABS    Sig: Take 1-1.5 tablets (10-15 mg total) by mouth every 4 (four) hours as needed for up to 7 days (pain score 7-10).    Dispense:  30 tablet    Refill:  0     Procedures: No procedures performed  Clinical Data: No additional findings.  ROS:  All other systems negative, except as noted in the HPI. Review of Systems  Objective: Vital Signs: There were no vitals taken for this visit.  Specialty Comments:  No specialty comments available.  PMFS History: Patient Active Problem List   Diagnosis Date Noted   Bipolar 1 disorder, depressed, partial remission (HCC) 11/18/2022   Left above-knee amputee (HCC) 11/13/2022   Infection of total knee replacement (HCC) 11/04/2022    S/P AKA (above knee amputation) unilateral, left (HCC) 11/04/2022   Chronic hepatitis C with cirrhosis (HCC) 06/06/2022   Surgical wound dehiscence, initial encounter 06/04/2022   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, left, with nonunion, subsequent encounter 04/30/2022   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 04/30/2022   Hypokalemia 04/16/2021   Hip fracture requiring operative repair (HCC) 04/16/2021   Vitamin D deficiency 04/12/2021   Hyponatremia 04/11/2021   Acute postoperative anemia due to expected blood loss 04/11/2021   Class 1 obesity 04/11/2021   Left comminuted and displaced fracture of the distal femur metaphysis 04/08/2021   History of CVA (cerebrovascular accident) 04/08/2021   Genetic testing 07/17/2020   Family history of breast cancer 07/10/2020   Family history of ovarian cancer 07/10/2020   Family history of colon cancer 07/10/2020   Malignant neoplasm of upper-outer quadrant of left breast in female, estrogen receptor positive (HCC) 07/09/2020   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   Chronic pain syndrome 10/06/2012   Nicotine dependence 10/06/2012   Anxiety state 10/05/2012   Bipolar disorder, unspecified (HCC) 10/05/2012   Slurred speech 10/04/2012   Left-sided weakness 10/04/2012   Hyperkalemia 10/04/2012   Acute CVA (cerebrovascular accident) (HCC) 10/04/2012   Hypertension    Past Medical History:  Diagnosis Date   Anemia    hx of   Anxiety    on meds   Arthritis    back/bilateral ankles   Bipolar disorder (HCC)    Breast cancer (HCC) 2022   LEFT breast-radiation   Depression    on meds   Family history of breast cancer 07/10/2020   Family history of colon cancer 07/10/2020   Family history of ovarian cancer 07/10/2020   GERD (gastroesophageal reflux disease)    on meds   Heart murmur    dx by PCP   Hepatitis C    HTN (hypertension)    on meds   Hx of radiation  therapy    Liver cirrhosis (HCC)    from hep c   Neuromuscular disorder (HCC)    Osteopenia    Personal history of radiation therapy    Stroke (HCC) 09/2012   slurred speech and LEFT sided weakness/notes 10/04/2012    Family History  Problem Relation Age of Onset   Breast cancer Mother    Other Mother        blood clots   Diabetes Sister    Neuropathy Sister        diabetic neuropathy  Breast cancer Maternal Aunt        50   Colon cancer Maternal Aunt 69   Diabetes Paternal Aunt    Diabetes Brother    Neuropathy Brother        diabetic neuropathy   Breast cancer Other        PGM's sister; dx mid 3s   Colon cancer Other        PGM's brother; dx 58s   Ovarian cancer Other        PGM's mother; dx unknown age   Esophageal cancer Neg Hx    Rectal cancer Neg Hx    Stomach cancer Neg Hx    Colon polyps Neg Hx     Past Surgical History:  Procedure Laterality Date   AMPUTATION Left 11/04/2022   Procedure: LEFT ABOVE KNEE AMPUTATION;  Surgeon: Nadara Mustard, MD;  Location: [facility] OR;  Service: Orthopedics;  Laterality: Left;   ANKLE FRACTURE SURGERY Left 2018   APPLICATION OF WOUND VAC Left 06/15/2022   Procedure: APPLICATION OF WOUND VAC;  Surgeon: Peggye Form, DO;  Location: [facility];  Service: Plastics;  Laterality: Left;   APPLICATION OF WOUND VAC Left 09/14/2022   Procedure: APPLICATION OF WOUND VAC;  Surgeon: Peggye Form, DO;  Location: [facility];  Service: Plastics;  Laterality: Left;   BREAST LUMPECTOMY Left 07/2020   BREAST LUMPECTOMY WITH RADIOACTIVE SEED AND SENTINEL LYMPH NODE BIOPSY Left 07/26/2020   Procedure: LEFT BREAST LUMPECTOMY WITH RADIOACTIVE SEED AND SENTINEL LYMPH NODE BIOPSY;  Surgeon: Abigail Miyamoto, MD;  Location: [facility];  Service: General;  Laterality: Left;   CESAREAN SECTION  1986   COLONOSCOPY     DIAGNOSTIC LAPAROSCOPY  1985   HARDWARE REMOVAL Left 04/30/2022   Procedure: HARDWARE REMOVAL LEFT FEMUR;  Surgeon:  Samson Frederic, MD;  Location: [facility];  Service: Orthopedics;  Laterality: Left;  150   HARDWARE REMOVAL Left 11/04/2022   Procedure: HARDWARE REMOVAL INFECTED LEFT TOTAL KNEE ARTHROPLASTY;  Surgeon: Nadara Mustard, MD;  Location: [facility] OR;  Service: Orthopedics;  Laterality: Left;   I & D KNEE WITH POLY EXCHANGE Left 06/07/2022   Procedure: IRRIGATION AND DEBRIDEMENT KNEE WITH POLY EXCHANGE;  Surgeon: Samson Frederic, MD;  Location: [facility];  Service: Orthopedics;  Laterality: Left;   INCISION AND DRAINAGE OF WOUND Left 06/04/2022   Procedure: IRRIGATION AND DEBRIDEMENT KNEE;  Surgeon: Samson Frederic, MD;  Location: [facility];  Service: Orthopedics;  Laterality: Left;  60   INCISION AND DRAINAGE OF WOUND Left 06/15/2022   Procedure: excision of knee wound with Myriad;  Surgeon: Peggye Form, DO;  Location: [facility];  Service: Plastics;  Laterality: Left;   ORIF FEMUR FRACTURE Left 04/10/2021   Procedure: OPEN REDUCTION INTERNAL FIXATION (ORIF) DISTAL FEMUR FRACTURE;  Surgeon: Myrene Galas, MD;  Location: [facility];  Service: Orthopedics;  Laterality: Left;   PARTIAL KNEE ARTHROPLASTY Left 04/30/2022   Procedure: DISTAL FEMUR REPLACEMENT;  Surgeon: Samson Frederic, MD;  Location: [facility];  Service: Orthopedics;  Laterality: Left;  150   Social History   Occupational History   Occupation: diabled  Tobacco Use   Smoking status: Former    Current packs/day: 1.50    Average packs/day: 1.5 packs/day for 40.0 years (60.0 ttl pk-yrs)    Types: Cigarettes   Smokeless tobacco: Never   Tobacco comments:    has used e-cigarettes, no vaping   Vaping Use   Vaping status: Never Used  Substance and Sexual  Activity   Alcohol use: Not Currently   Drug use: No   Sexual activity: Not Currently

## 2022-12-01 ENCOUNTER — Encounter: Payer: Self-pay | Admitting: Internal Medicine

## 2022-12-01 ENCOUNTER — Ambulatory Visit: Payer: Medicare Other | Admitting: Internal Medicine

## 2022-12-01 ENCOUNTER — Other Ambulatory Visit: Payer: Self-pay

## 2022-12-01 ENCOUNTER — Telehealth: Payer: Self-pay

## 2022-12-01 VITALS — BP 141/79 | HR 73 | Temp 98.0°F | Wt 153.0 lb

## 2022-12-01 DIAGNOSIS — D72819 Decreased white blood cell count, unspecified: Secondary | ICD-10-CM | POA: Diagnosis not present

## 2022-12-01 DIAGNOSIS — M869 Osteomyelitis, unspecified: Secondary | ICD-10-CM

## 2022-12-01 DIAGNOSIS — M8618 Other acute osteomyelitis, other site: Secondary | ICD-10-CM | POA: Diagnosis not present

## 2022-12-01 DIAGNOSIS — T847XXD Infection and inflammatory reaction due to other internal orthopedic prosthetic devices, implants and grafts, subsequent encounter: Secondary | ICD-10-CM

## 2022-12-01 LAB — CBC
HCT: 32.3 % — ABNORMAL LOW (ref 35.0–45.0)
Hemoglobin: 10 g/dL — ABNORMAL LOW (ref 11.7–15.5)
MCH: 26.8 pg — ABNORMAL LOW (ref 27.0–33.0)
MCHC: 31 g/dL — ABNORMAL LOW (ref 32.0–36.0)
MCV: 86.6 fL (ref 80.0–100.0)
MPV: 10.6 fL (ref 7.5–12.5)
Platelets: 364 10*3/uL (ref 140–400)
RBC: 3.73 10*6/uL — ABNORMAL LOW (ref 3.80–5.10)
RDW: 14.4 % (ref 11.0–15.0)
WBC: 5.5 10*3/uL (ref 3.8–10.8)

## 2022-12-01 NOTE — Telephone Encounter (Signed)
Per Dr. Renold Don patient end date to stop IV abx and pull picc after last dose is on 11/5. Sent a message to notify [facility] about orders.

## 2022-12-01 NOTE — Progress Notes (Signed)
[facility]  Patient Active Problem List   Diagnosis Date Noted   Bipolar 1 disorder, depressed, partial remission (HCC) 11/18/2022   Left above-knee amputee (HCC) 11/13/2022   Infection of total knee replacement (HCC) 11/04/2022   S/P AKA (above knee amputation) unilateral, left (HCC) 11/04/2022   Chronic hepatitis C with cirrhosis (HCC) 06/06/2022   Surgical wound dehiscence, initial encounter 06/04/2022   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, left, with nonunion, subsequent encounter 04/30/2022   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 04/30/2022   Hypokalemia 04/16/2021   Hip fracture requiring operative repair (HCC) 04/16/2021   Vitamin D deficiency 04/12/2021   Hyponatremia 04/11/2021   Acute postoperative anemia due to expected blood loss 04/11/2021   Class 1 obesity 04/11/2021   Left comminuted and displaced fracture of the distal femur metaphysis 04/08/2021   History of CVA (cerebrovascular accident) 04/08/2021   Genetic testing 07/17/2020   Family history of breast cancer 07/10/2020   Family history of ovarian cancer 07/10/2020   Family history of colon cancer 07/10/2020   Malignant neoplasm of upper-outer quadrant of left breast in female, estrogen receptor positive (HCC) 07/09/2020   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   Chronic pain syndrome 10/06/2012   Nicotine dependence 10/06/2012   Anxiety state 10/05/2012   Bipolar disorder, unspecified (HCC) 10/05/2012   Slurred speech 10/04/2012   Left-sided weakness 10/04/2012   Hyperkalemia 10/04/2012   Acute CVA (cerebrovascular accident) (HCC) 10/04/2012   Hypertension       Subjective:    Patient ID: Tammy [name], female    DOB: 08-31-1955, 67 y.o.   MRN: [id number]  Chief Complaint  Patient presents with   Hospitalization Follow-up    HPI:  Tammy [name] is a 67 y.o. female  here for f/u left thigh om  hx left femoral fx, with initial orif 04/2021, s/p distal femoral reconstruction 04/30/22 due to nonunion/failed hardware, admitted 4/25 for wound dehiscence/concern of chronic surgical site/hardware associated infection   Patient developed wound dehiscence/cellulitis several weeks after 04/30/22 surgery given cefadroxil prior to this admission   04/30/22 operative swab cx negative and no sign of sepsis prior to that 06/04/22 operative cx e faecalis  06/07/22 repeat I&D cx e faecalis     She has partial hardware removal but new hardware placed in infected area. Likely will need long term antibiotics suppression She was discharged 2-3 weeks ago; s/p I&D in hospital and cx e faecalis Discharged on ampicillin  Diarrhea on unasyn but not amp 6 weeks iv planned till 07/19/22 then indefinite suppression   Still have wound vac and was changed twice last 2-3 weeks by her surgeon's office Minimal pain if at all; no purulence out of vac. Granulating tissue in wound   ---------- 08/25/22 id clinic f/u She should have finished iv abx on 07/19/22 and then go on amoxicillin for indefinite suppression in setting hardware in the infected site (partial hardware removal and new hardware placement at times of I&D) She takes amoxicillin as rx'ed 3 times a day 1 gram Some nausea with meds No diarrhea  Some purulence and wound break down and pain No fever chill  See surgery today to see if more surgery needed  Asking for phenergan refill  Bp high    12/01/22 id clinic f/u 1 month f/u visit See below a&P     Allergies  Allergen Reactions   Lisinopril Cough  Outpatient Medications Prior to Visit  Medication Sig Dispense Refill   acetaminophen (TYLENOL) 325 MG tablet Take 2 tablets (650 mg total) by mouth every 6 (six) hours as needed.     amLODipine (NORVASC) 10 MG tablet Take 1 tablet (10 mg total) by mouth daily. 30 tablet 0   anastrozole (ARIMIDEX) 1 MG  tablet TAKE 1 TABLET BY MOUTH DAILY 90 tablet 3   apixaban (ELIQUIS) 5 MG TABS tablet Take 1 tablet (5 mg total) by mouth 2 (two) times daily. 60 tablet 3   Ascorbic Acid (VITAMIN C) 1000 MG tablet Take 1,000 mg by mouth daily.     buPROPion (WELLBUTRIN SR) 150 MG 12 hr tablet Take 150 mg by mouth 2 (two) times daily.     calcium carbonate (OS-CAL - DOSED IN MG OF ELEMENTAL CALCIUM) 1250 (500 Ca) MG tablet Take 1 tablet by mouth daily.     cefTRIAXone 2 g in sodium chloride 0.9 % 100 mL Inject 2 g into the vein daily.     Cyanocobalamin (VITAMIN B-12 PO) Take 2,000 mcg by mouth daily.     gabapentin (NEURONTIN) 300 MG capsule Take 2 capsules (600 mg total) by mouth 3 (three) times daily. 180 capsule 2   hydrALAZINE (APRESOLINE) 25 MG tablet Take 3 tablets (75 mg total) by mouth every 8 (eight) hours. 270 tablet 0   melatonin 5 MG TABS Take 1 tablet (5 mg total) by mouth at bedtime.     Menthol, Topical Analgesic, (BENGAY EX) Apply 1 Application topically daily as needed (pain).     Oxycodone HCl 10 MG TABS Take 1-1.5 tablets (10-15 mg total) by mouth every 4 (four) hours as needed for up to 7 days (pain score 7-10). 30 tablet 0   pantoprazole (PROTONIX) 40 MG tablet Take 1 tablet (40 mg total) by mouth daily. 30 tablet 0   potassium chloride (KLOR-CON) 10 MEQ tablet Take 10 mEq by mouth daily.     Prenatal Vit-Fe Fumarate-FA (PRENATAL PO) Take 1 tablet by mouth daily.     promethazine (PHENERGAN) 12.5 MG tablet Take 1 tablet (12.5 mg total) by mouth every 8 (eight) hours as needed for nausea or vomiting. 20 tablet 1   vancomycin IVPB Inject 750 mg into the vein every 12 (twelve) hours. Indication:  osteomyelitis s/p AKA with retained hardware First Dose: No Last Day of Therapy:  12/15/22 Labs - Sunday/Monday:  CBC/D, BMP, and vancomycin trough. Labs - Thursday:  BMP and vancomycin trough Labs - Once weekly: ESR and CRP Method of administration:Elastomeric Method of administration may be changed  at the discretion of the patient and/or caregiver's ability to self-administer the medication ordered. 66 Units 0   zinc gluconate 50 MG tablet Take 50 mg by mouth daily.     No facility-administered medications prior to visit.     Social History   Socioeconomic History   Marital status: Significant Other    Spouse name: Not on file   Number of children: 2   Years of education: Not on file   Highest education level: Not on file  Occupational History   Occupation: diabled  Tobacco Use   Smoking status: Former    Current packs/day: 1.50    Average packs/day: 1.5 packs/day for 40.0 years (60.0 ttl pk-yrs)    Types: Cigarettes   Smokeless tobacco: Never   Tobacco comments:    has used e-cigarettes, no vaping   Vaping Use   Vaping status: Never Used  Substance and Sexual  Activity   Alcohol use: Not Currently   Drug use: No   Sexual activity: Not Currently  Other Topics Concern   Not on file  Social History Narrative   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   Caffeine: 1 cup in the morning    Social Determinants of Health   Financial Resource Strain: Not on file  Food Insecurity: No Food Insecurity (11/11/2022)   Hunger Vital Sign    Worried About Running Out of Food in the Last Year: Never true    Ran Out of Food in the Last Year: Never true  Transportation Needs: No Transportation Needs (11/11/2022)   PRAPARE - Administrator, Civil Service (Medical): No    Lack of Transportation (Non-Medical): No  Physical Activity: Not on file  Stress: Not on file  Social Connections: Not on file  Intimate Partner Violence: Not At Risk (11/11/2022)   Humiliation, Afraid, Rape, and Kick questionnaire    Fear of Current or Ex-Partner: No    Emotionally Abused: No    Physically Abused: No    Sexually Abused: No      Review of Systems    All other ros negative   Objective:    Wt 153 lb (69.4 kg)   BMI 27.98 kg/m  Nursing note and vital signs  reviewed.  Physical Exam     General/constitutional: no distress, pleasant HEENT: Normocephalic, PER, Conj Clear, EOMI, Oropharynx clear Neck supple CV: rrr no mrg Lungs: clear to auscultation, normal respiratory effort Abd: Soft, Nontender Ext: no edema Skin: No Rash Neuro: nonfocal MSK: left knee in dressing -- wound vac chamber murky and purulent   11/30/22 stump picture   Labs: Lab Results  Component Value Date   WBC 3.8 (L) 11/23/2022   HGB 8.8 (L) 11/23/2022   HCT 28.8 (L) 11/23/2022   MCV 87.5 11/23/2022   PLT 353 11/23/2022   Last metabolic panel Lab Results  Component Value Date   GLUCOSE 107 (H) 11/23/2022   NA 139 11/23/2022   K 3.8 11/23/2022   CL 104 11/23/2022   CO2 24 11/23/2022   BUN 15 11/23/2022   CREATININE 0.86 11/23/2022   GFRNONAA >60 11/23/2022   CALCIUM 9.5 11/23/2022   PHOS 3.9 06/15/2022   PROT 5.8 (L) 11/14/2022   ALBUMIN 2.3 (L) 11/14/2022   LABGLOB 3.3 12/01/2018   BILITOT 0.3 11/14/2022   ALKPHOS 91 11/14/2022   AST 17 11/14/2022   ALT 16 11/14/2022   ANIONGAP 11 11/23/2022    Lab Results  Component Value Date   CRP 9.6 (H) 11/03/2022     Micro:  Serology:  Imaging:  Assessment & Plan:   Problem List Items Addressed This Visit   None Visit Diagnoses     Hardware complicating wound infection, subsequent encounter    -  Primary   Relevant Orders   CBC   Osteomyelitis, unspecified site, unspecified type (HCC)       Leukopenia, unspecified type                No orders of the defined types were placed in this encounter.    Abx: 11/04/22-c vanc 11/04/22-c ceftriaxone  6/09-9/25/24 amox 1 gram tid 4/30-c amp-sulbactam  --> amp     Assessment: 67 yo female hx left femoral fx, with initial orif 04/2021, s/p distal femoral reconstruction 04/30/22 due to nonunion/failed hardware, admitted 4/25 for wound dehiscence/concern of chronic surgical site/hardware associated infection  Patient developed wound  dehiscence/cellulitis several weeks after 04/30/22 surgery given cefadroxil prior to this admission   04/30/22 operative swab cx negative and no sign of sepsis prior to that 06/04/22 operative cx e faecalis  06/07/22 repeat I&D cx e faecalis     She has partial hardware removal but new hardware placed in infected area. Likely will need long term antibiotics suppression   ------ 5/2 assessment Needing more I&D with plastic surgery As above 4/25 and 4/28 operative cx e faecalis   Diarrhea with amp-sulb   Will change abx to ampicillin alone. She'll need long term suppressive amoxicillin after 6 weeks of ampicillin, and can start clock on 4/28       ---------------- 07/09/22 id assessment Patient remains on iv ampicillin via picc without adverse effect She has been seen at surgery clinic recently for wound vac change since discharged from hospital on 06/18/22 She had no diarrhea or n/v/rash  Her last opat labs 5/28 reviewed Crp 2 (<10); sed rate 65  Lft 11/15/88/0.2 Cbc7/10/300; cr 0.63; alb 4  08/25/22 id clinic assessment Concerning purulence/pain and still not yet all closed up wound Labs today to assess abx toxicity/inflammatory changes Phenergan refill If I&D needed will likely need to switch [name] to iv abx for a few weeks before going to amox again. And she should be seen by inpatient ID team Otherwise see me in 3-4 weeks Continue amox for now   09/18/22 id clinic assessment Patient had plastic surgery debridement of the wound skin/soft tissue and placement of another graft along with myriad. I reviewed operative note. 1 cm opening noted within the knee joint with hardware exposed. No pus described during surgery No cultures done She said she feels very good with the knee much better in terms of pain/discomfort No purulence Minimal output into the wound vac She continues on amoxicillin  Will get labs next week and see her in 6-8 week  11/03/22 id clinic assessment Patient to  have aka tomorrow with dr [name]. She continues to have purulent drainage in the wound vac; and the wound doesn't heal; and there is significant pain in the infected area. Skin graft trial previously from plastic surgery and that failed as well. The hardware remains exposed  Inpatient id team will need to see patient when she is getting amputation (tomorrow 9/25) and culture would need to be sent. She'll need targetted therapy for at least another 6 weeks after amputation and hopefully all the hardware will be removed    F/u with me 2-3 weeks after release from hospital   12/01/22 id clinic assessment Patient s/p aka 11/04/22, but still with some retained femoral hardware (femoral rod attempted to be removed unsucessfully so distal part sawed off); operative cx negative She was discharged on 6 weeks vanc/ceftriaxone to finish 11/05 Stump looks great no pain -- staples removed 11/30/22 Reviewed recent labs from 11/23/22 Reviewed opat labs from 10/17: cbc 1.7/15/192 (lymph 300; neutrophil 1100); cr 0.6; no lft; crp 10 (<10) Wbc trending down ?abx effect -- will repeat cbc here today F/u 3-4 weeks On 11/5 hh can remove picc and she can resume amox 1000 mg tid for another 6 weeks before suppressing at likely 1 gram bid   Follow-up: Return in about 4 weeks (around 12/29/2022).      Raymondo Band, MD [facility] 12/01/2022, 9:10 AM

## 2022-12-01 NOTE — Patient Instructions (Addendum)
See me in around 3-4 weeks  Your wbc is a little low and will monitor, if it continues to go significantly lower will need to adjust your antibiotics  On 11/5 your home health will remove picc. You can start amoxicillin again that day 1000 mg three times a day   You 'll need to remain on oral antibiotics for 6-12 months at least given there is still some hardware in there

## 2022-12-03 ENCOUNTER — Telehealth: Payer: Self-pay | Admitting: *Deleted

## 2022-12-03 NOTE — Telephone Encounter (Signed)
Mrs Walzer would like to speak with Dr Kathrynn Ducking about her gabapentin. I have called her back and had to leave a message for her to give [place] more information on what she needs.

## 2022-12-04 ENCOUNTER — Telehealth: Payer: Self-pay | Admitting: Specialist

## 2022-12-04 NOTE — Telephone Encounter (Signed)
Patient has called 2 times on 12/04/22 Patient of Dr. Natale Lay.  SHe is still having phantom pain in her foot and would like to increase her gabapentin. Please call her. Shirlean Mylar, MHA, OT/L [phone]

## 2022-12-07 ENCOUNTER — Encounter: Payer: Medicare Other | Admitting: Physical Medicine & Rehabilitation

## 2022-12-07 MED ORDER — GABAPENTIN 400 MG PO CAPS: 800.0000 mg | ORAL_CAPSULE | Freq: Three times a day (TID) | ORAL | 3 refills | Status: DC

## 2022-12-10 ENCOUNTER — Other Ambulatory Visit: Payer: Self-pay

## 2022-12-10 ENCOUNTER — Other Ambulatory Visit: Payer: Self-pay | Admitting: Nurse Practitioner

## 2022-12-10 ENCOUNTER — Telehealth: Payer: Self-pay

## 2022-12-10 DIAGNOSIS — K7469 Other cirrhosis of liver: Secondary | ICD-10-CM

## 2022-12-10 MED ORDER — DOXYCYCLINE HYCLATE 100 MG PO TABS: 100.0000 mg | ORAL_TABLET | Freq: Two times a day (BID) | ORAL | 1 refills | Status: DC

## 2022-12-10 MED ORDER — AMOXICILLIN-POT CLAVULANATE 875-125 MG PO TABS: 1.0000 | ORAL_TABLET | Freq: Two times a day (BID) | ORAL | 1 refills | Status: DC

## 2022-12-10 NOTE — Telephone Encounter (Signed)
Patient called, reports HH has been unable to draw blood from her PICC line and that flushing is sluggish. Message sent to Jeri Modena, RN with [facility] to see if Cathflo can be done in home.   Sandie Ano, RN

## 2022-12-10 NOTE — Telephone Encounter (Signed)
Jeri Modena, RN with [employer] stated patient insurance unlikely to cover cathflow in home.   Message sent to Marin Shutter, RN to see if patient can have [facility].   Spoke to patient - patient stated that her fiance doesn't have trouble administering IV abx's and nurses seem to have issues drawing blood.    Per Dr.Vu OPAT end date is 12/15/2022 - okay to pull PICC line today or ASAP. Patient will start doxycycline 100 mg BID and Augmentin 875 mg BID for 6 weeks.    Patient aware and agreeable to changes. Message sent to Jeri Modena, RN and [facility] pharmacists.

## 2022-12-10 NOTE — Telephone Encounter (Signed)
Patient called stating that the increase in Gabapentin is not helping with Phantom pain.

## 2022-12-10 NOTE — Telephone Encounter (Signed)
Sounds great - thank you for the heads up

## 2022-12-11 ENCOUNTER — Other Ambulatory Visit: Payer: Self-pay

## 2022-12-11 MED ORDER — ANASTROZOLE 1 MG PO TABS: 1.0000 mg | ORAL_TABLET | Freq: Every day | ORAL | 3 refills | Status: DC

## 2022-12-14 ENCOUNTER — Ambulatory Visit (INDEPENDENT_AMBULATORY_CARE_PROVIDER_SITE_OTHER): Payer: Medicare Other | Admitting: Orthopedic Surgery

## 2022-12-14 ENCOUNTER — Encounter: Payer: Self-pay | Admitting: Orthopedic Surgery

## 2022-12-14 DIAGNOSIS — Z89612 Acquired absence of left leg above knee: Secondary | ICD-10-CM | POA: Diagnosis not present

## 2022-12-14 NOTE — Progress Notes (Signed)
Office Visit Note   Patient: Tammy [name]           Date of Birth: 07-Apr-1955           MRN: [id number] Visit Date: 12/14/2022              Requested by: Quitman Livings, MD [address] [place],  [place] [address] PCP: Quitman Livings, MD  Chief Complaint  Patient presents with   Left Leg - Routine Post Op    11/04/2022 left AKA      HPI: Patient is a 67 year old woman who is 5 weeks status post left above-knee amputation.  Assessment & Plan: Visit Diagnoses:  1. Hx of AKA (above knee amputation), left (HCC)     Plan: Staples are harvested.  Patient will follow-up with [facility] for prosthetic fitting.  Follow-Up Instructions: Return in about 4 weeks (around 01/11/2023).   Ortho Exam  Patient is alert, oriented, no adenopathy, well-dressed, normal affect, normal respiratory effort. Examination the incision is well-approximated there is no cellulitis no drainage.  Staples are harvested.  Patient is a new left transfemoral amputee.  Patient's current comorbidities are not expected to impact the ability to function with the prescribed prosthesis. Patient verbally communicates a strong desire to use a prosthesis. Patient currently requires mobility aids to ambulate without a prosthesis.  Expects not to use mobility aids with a new prosthesis.  Patient is a K2 level ambulator that will use a prosthesis to walk around their home and the community over low level environmental barriers.      Imaging: No results found. No images are attached to the encounter.  Labs: Lab Results  Component Value Date   HGBA1C 5.6 03/17/2022   HGBA1C 5.6 12/01/2018   HGBA1C 5.9 (H) 05/11/2015   ESRSEDRATE 38 (H) 03/17/2022   CRP 9.6 (H) 11/03/2022   CRP 68.5 (H) 09/22/2022   CRP 47.9 (H) 08/25/2022   REPTSTATUS 11/09/2022 FINAL 11/04/2022   GRAMSTAIN NO WBC SEEN NO ORGANISMS SEEN  11/04/2022   CULT  11/04/2022    No growth aerobically or anaerobically. Performed at  [facility], [address], [place], [place] [address]    [facility] ENTEROCOCCUS FAECALIS 06/07/2022     Lab Results  Component Value Date   ALBUMIN 2.3 (L) 11/14/2022   ALBUMIN 4.0 10/13/2022   ALBUMIN 3.0 (L) 06/16/2022    Lab Results  Component Value Date   MG 2.0 06/16/2022   MG 2.4 06/15/2022   MG 2.1 06/13/2022   Lab Results  Component Value Date   VD25OH 10.88 (L) 04/11/2021    No results found for: "PREALBUMIN"    Latest Ref Rng & Units 12/01/2022    9:38 AM 11/23/2022    5:46 AM 11/19/2022    3:40 AM  CBC EXTENDED  WBC 3.8 - 10.8 Thousand/uL 5.5  3.8  5.0   RBC 3.80 - 5.10 Million/uL 3.73  3.29  2.96   Hemoglobin 11.7 - 15.5 g/dL 86.5  8.8  8.1   HCT 78.4 - 45.0 % 32.3  28.8  26.1   Platelets 140 - 400 Thousand/uL 364  353  358      There is no height or weight on file to calculate BMI.  Orders:  No orders of the defined types were placed in this encounter.  No orders of the defined types were placed in this encounter.    Procedures: No procedures performed  Clinical Data: No additional findings.  ROS:  All other systems negative, except as noted in the HPI. Review of Systems  Objective: Vital Signs: There were no vitals taken for this visit.  Specialty Comments:  No specialty comments available.  PMFS History: Patient Active Problem List   Diagnosis Date Noted   Bipolar 1 disorder, depressed, partial remission (HCC) 11/18/2022   Left above-knee amputee (HCC) 11/13/2022   Infection of total knee replacement (HCC) 11/04/2022   S/P AKA (above knee amputation) unilateral, left (HCC) 11/04/2022   Chronic hepatitis C with cirrhosis (HCC) 06/06/2022   Surgical wound dehiscence, initial encounter 06/04/2022   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, left, with nonunion, subsequent encounter 04/30/2022   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 04/30/2022   Hypokalemia  04/16/2021   Hip fracture requiring operative repair (HCC) 04/16/2021   Vitamin D deficiency 04/12/2021   Hyponatremia 04/11/2021   Acute postoperative anemia due to expected blood loss 04/11/2021   Class 1 obesity 04/11/2021   Left comminuted and displaced fracture of the distal femur metaphysis 04/08/2021   History of CVA (cerebrovascular accident) 04/08/2021   Genetic testing 07/17/2020   Family history of breast cancer 07/10/2020   Family history of ovarian cancer 07/10/2020   Family history of colon cancer 07/10/2020   Malignant neoplasm of upper-outer quadrant of left breast in female, estrogen receptor positive (HCC) 07/09/2020   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   Chronic pain syndrome 10/06/2012   Nicotine dependence 10/06/2012   Anxiety state 10/05/2012   Bipolar disorder, unspecified (HCC) 10/05/2012   Slurred speech 10/04/2012   Left-sided weakness 10/04/2012   Hyperkalemia 10/04/2012   Acute CVA (cerebrovascular accident) (HCC) 10/04/2012   Hypertension    Past Medical History:  Diagnosis Date   Anemia    hx of   Anxiety    on meds   Arthritis    back/bilateral ankles   Bipolar disorder (HCC)    Breast cancer (HCC) 2022   LEFT breast-radiation   Depression    on meds   Family history of breast cancer 07/10/2020   Family history of colon cancer 07/10/2020   Family history of ovarian cancer 07/10/2020   GERD (gastroesophageal reflux disease)    on meds   Heart murmur    dx by PCP   Hepatitis C    HTN (hypertension)    on meds   Hx of radiation therapy    Liver cirrhosis (HCC)    from hep c   Neuromuscular disorder (HCC)    Osteopenia    Personal history of radiation therapy    Stroke (HCC) 09/2012   slurred speech and LEFT sided weakness/notes 10/04/2012    Family History  Problem Relation Age of Onset   Breast cancer Mother    Other Mother        blood clots   Diabetes Sister    Neuropathy Sister        diabetic neuropathy   Breast  cancer Maternal Aunt        50   Colon cancer Maternal Aunt 69   Diabetes Paternal Aunt    Diabetes Brother    Neuropathy Brother        diabetic neuropathy   Breast cancer Other        PGM's sister; dx mid 59s   Colon cancer Other        PGM's brother; dx 35s   Ovarian cancer Other        PGM's mother; dx unknown  age   Esophageal cancer Neg Hx    Rectal cancer Neg Hx    Stomach cancer Neg Hx    Colon polyps Neg Hx     Past Surgical History:  Procedure Laterality Date   AMPUTATION Left 11/04/2022   Procedure: LEFT ABOVE KNEE AMPUTATION;  Surgeon: Nadara Mustard, MD;  Location: [facility] OR;  Service: Orthopedics;  Laterality: Left;   ANKLE FRACTURE SURGERY Left 2018   APPLICATION OF WOUND VAC Left 06/15/2022   Procedure: APPLICATION OF WOUND VAC;  Surgeon: Peggye Form, DO;  Location: [facility];  Service: Plastics;  Laterality: Left;   APPLICATION OF WOUND VAC Left 09/14/2022   Procedure: APPLICATION OF WOUND VAC;  Surgeon: Peggye Form, DO;  Location: [facility] OR;  Service: Plastics;  Laterality: Left;   BREAST LUMPECTOMY Left 07/2020   BREAST LUMPECTOMY WITH RADIOACTIVE SEED AND SENTINEL LYMPH NODE BIOPSY Left 07/26/2020   Procedure: LEFT BREAST LUMPECTOMY WITH RADIOACTIVE SEED AND SENTINEL LYMPH NODE BIOPSY;  Surgeon: Abigail Miyamoto, MD;  Location: [facility];  Service: General;  Laterality: Left;   CESAREAN SECTION  1986   COLONOSCOPY     DIAGNOSTIC LAPAROSCOPY  1985   HARDWARE REMOVAL Left 04/30/2022   Procedure: HARDWARE REMOVAL LEFT FEMUR;  Surgeon: Samson Frederic, MD;  Location: [facility];  Service: Orthopedics;  Laterality: Left;  150   HARDWARE REMOVAL Left 11/04/2022   Procedure: HARDWARE REMOVAL INFECTED LEFT TOTAL KNEE ARTHROPLASTY;  Surgeon: Nadara Mustard, MD;  Location: [facility] OR;  Service: Orthopedics;  Laterality: Left;   I & D KNEE WITH POLY EXCHANGE Left 06/07/2022   Procedure: IRRIGATION AND DEBRIDEMENT KNEE WITH POLY EXCHANGE;  Surgeon: Samson Frederic, MD;  Location: [facility];  Service: Orthopedics;  Laterality: Left;   INCISION AND DRAINAGE OF WOUND Left 06/04/2022   Procedure: IRRIGATION AND DEBRIDEMENT KNEE;  Surgeon: Samson Frederic, MD;  Location: [facility];  Service: Orthopedics;  Laterality: Left;  60   INCISION AND DRAINAGE OF WOUND Left 06/15/2022   Procedure: excision of knee wound with Myriad;  Surgeon: Peggye Form, DO;  Location: [facility];  Service: Plastics;  Laterality: Left;   ORIF FEMUR FRACTURE Left 04/10/2021   Procedure: OPEN REDUCTION INTERNAL FIXATION (ORIF) DISTAL FEMUR FRACTURE;  Surgeon: Myrene Galas, MD;  Location: [facility];  Service: Orthopedics;  Laterality: Left;   PARTIAL KNEE ARTHROPLASTY Left 04/30/2022   Procedure: DISTAL FEMUR REPLACEMENT;  Surgeon: Samson Frederic, MD;  Location: [facility];  Service: Orthopedics;  Laterality: Left;  150   Social History   Occupational History   Occupation: diabled  Tobacco Use   Smoking status: Former    Current packs/day: 1.50    Average packs/day: 1.5 packs/day for 40.0 years (60.0 ttl pk-yrs)    Types: Cigarettes   Smokeless tobacco: Never   Tobacco comments:    has used e-cigarettes, no vaping   Vaping Use   Vaping status: Never Used  Substance and Sexual Activity   Alcohol use: Not Currently   Drug use: No   Sexual activity: Not Currently

## 2022-12-15 ENCOUNTER — Telehealth: Payer: Self-pay | Admitting: *Deleted

## 2022-12-15 NOTE — Telephone Encounter (Signed)
Received on (8/7,8/8,8/16) request from [id number] for wound assessments for wound vac measurements on the patient.   Faxed on (09/30/22) office visit notes from (07/29/22,08/17/22,08/25/22) with wound measurements to Trevor Mace with [id number].  Confirmation received.//AB/CMA

## 2022-12-16 ENCOUNTER — Other Ambulatory Visit: Payer: Self-pay

## 2022-12-17 ENCOUNTER — Other Ambulatory Visit: Payer: Self-pay

## 2022-12-18 ENCOUNTER — Ambulatory Visit
Admission: RE | Admit: 2022-12-18 | Discharge: 2022-12-18 | Disposition: A | Discharge status: Home / Self Care | Payer: Medicare Other | Source: Ambulatory Visit | Attending: Nurse Practitioner | Admitting: Nurse Practitioner

## 2022-12-18 DIAGNOSIS — K7469 Other cirrhosis of liver: Secondary | ICD-10-CM

## 2022-12-18 NOTE — Telephone Encounter (Signed)
Spoke with pt, she still has this medication, she plans to f/u PCP for further refills. thanks

## 2022-12-22 ENCOUNTER — Ambulatory Visit: Payer: Medicare Other | Admitting: Internal Medicine

## 2022-12-22 ENCOUNTER — Encounter: Payer: Self-pay | Admitting: Internal Medicine

## 2022-12-22 ENCOUNTER — Other Ambulatory Visit: Payer: Self-pay

## 2022-12-22 ENCOUNTER — Other Ambulatory Visit: Payer: Self-pay | Admitting: Orthopedic Surgery

## 2022-12-22 ENCOUNTER — Telehealth: Payer: Self-pay | Admitting: Orthopedic Surgery

## 2022-12-22 VITALS — BP 134/74 | HR 70 | Temp 97.8°F

## 2022-12-22 DIAGNOSIS — Z89612 Acquired absence of left leg above knee: Secondary | ICD-10-CM

## 2022-12-22 DIAGNOSIS — M869 Osteomyelitis, unspecified: Secondary | ICD-10-CM

## 2022-12-22 DIAGNOSIS — T847XXD Infection and inflammatory reaction due to other internal orthopedic prosthetic devices, implants and grafts, subsequent encounter: Secondary | ICD-10-CM

## 2022-12-22 NOTE — Patient Instructions (Addendum)
Continue doxycycline and amoxicillin-clavulonate (augmentin 875/125) unil 12/17   On 12/17 stop doxycycline and augmentin, and start amoxicillin (one component) only 1000 mg twice a day   Will get blood tests on that day   F/u with me in around 4-6 weeks

## 2022-12-22 NOTE — Telephone Encounter (Signed)
Referral placed for PT with [facility] to start a month from now.

## 2022-12-22 NOTE — Telephone Encounter (Signed)
Patient called advised she went to [facility] yesterday and would like to request that she be set up for (PT) with [facility] a month from now. The number to contact patient is [phone]

## 2022-12-22 NOTE — Progress Notes (Signed)
[facility]  Patient Active Problem List   Diagnosis Date Noted   Bipolar 1 disorder, depressed, partial remission (HCC) 11/18/2022   Left above-knee amputee (HCC) 11/13/2022   Infection of total knee replacement (HCC) 11/04/2022   S/P AKA (above knee amputation) unilateral, left (HCC) 11/04/2022   Chronic hepatitis C with cirrhosis (HCC) 06/06/2022   Surgical wound dehiscence, initial encounter 06/04/2022   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, left, with nonunion, subsequent encounter 04/30/2022   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 04/30/2022   Hypokalemia 04/16/2021   Hip fracture requiring operative repair (HCC) 04/16/2021   Vitamin D deficiency 04/12/2021   Hyponatremia 04/11/2021   Acute postoperative anemia due to expected blood loss 04/11/2021   Class 1 obesity 04/11/2021   Left comminuted and displaced fracture of the distal femur metaphysis 04/08/2021   History of CVA (cerebrovascular accident) 04/08/2021   Genetic testing 07/17/2020   Family history of breast cancer 07/10/2020   Family history of ovarian cancer 07/10/2020   Family history of colon cancer 07/10/2020   Malignant neoplasm of upper-outer quadrant of left breast in female, estrogen receptor positive (HCC) 07/09/2020   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   Chronic pain syndrome 10/06/2012   Nicotine dependence 10/06/2012   Anxiety state 10/05/2012   Bipolar disorder, unspecified (HCC) 10/05/2012   Slurred speech 10/04/2012   Left-sided weakness 10/04/2012   Hyperkalemia 10/04/2012   Acute CVA (cerebrovascular accident) (HCC) 10/04/2012   Hypertension       Subjective:    Patient ID: Otila Back, female    DOB: [birth year], 67 y.o.   MRN: [id number]  Chief Complaint  Patient presents with   Follow-up    HPI:  Sharalyn Mikels is a 67 y.o. 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 Likely will need long term antibiotics suppression She was discharged 2-3 weeks ago; s/p I&D in hospital and cx e faecalis Discharged on ampicillin  Diarrhea on unasyn but not amp 6 weeks iv planned till 07/19/22 then indefinite suppression   Still have wound vac and was changed twice last 2-3 weeks by her surgeon's office Minimal pain if at all; no purulence out of vac. Granulating tissue in wound   ---------- 08/25/22 id clinic f/u She should have finished iv abx on 07/19/22 and then go on amoxicillin for indefinite suppression in setting hardware in the infected site (partial hardware removal and new hardware placement at times of I&D) She takes amoxicillin as rx'ed 3 times a day 1 gram Some nausea with meds No diarrhea  Some purulence and wound break down and pain No fever chill  See surgery today to see if more surgery needed  Asking for phenergan refill  Bp high    12/01/22 id clinic f/u 1 month f/u visit See below a&P   12/22/22 id clinic f/u See a&p for detail    Allergies  Allergen Reactions   Lisinopril Cough      Outpatient Medications Prior to Visit  Medication Sig Dispense Refill   acetaminophen (TYLENOL) 325 MG tablet Take 2 tablets (650 mg total) by mouth every 6 (six) hours as needed.     amLODipine (NORVASC) 10 MG tablet Take 1 tablet (10 mg total) by mouth daily. 30 tablet 0    amoxicillin-clavulanate (AUGMENTIN) 875-125 MG tablet Take 1 tablet by mouth 2 (two) times daily. 60 tablet 1   anastrozole (ARIMIDEX) 1 MG tablet Take 1 tablet (1 mg total) by mouth daily. 90 tablet 3   apixaban (ELIQUIS) 5 MG TABS tablet Take 1 tablet (5 mg total) by mouth 2 (two) times daily. 60 tablet 3   Ascorbic Acid (VITAMIN C) 1000 MG tablet Take 1,000 mg by mouth daily.     buPROPion (WELLBUTRIN SR) 150 MG 12 hr tablet Take 150 mg by mouth 2 (two) times daily.     calcium carbonate (OS-CAL - DOSED IN MG OF ELEMENTAL CALCIUM) 1250 (500 Ca) MG tablet Take 1 tablet by mouth daily.     Cyanocobalamin (VITAMIN B-12 PO) Take 2,000 mcg by mouth daily.     doxycycline (VIBRA-TABS) 100 MG tablet Take 1 tablet (100 mg total) by mouth 2 (two) times daily. 60 tablet 1   gabapentin (NEURONTIN) 400 MG capsule Take 2 capsules (800 mg total) by mouth 3 (three) times daily. 90 capsule 3   hydrALAZINE (APRESOLINE) 25 MG tablet Take 3 tablets (75 mg total) by mouth every 8 (eight) hours. 270 tablet 0   melatonin 5 MG TABS Take 1 tablet (5 mg total) by mouth at bedtime.     Menthol, Topical Analgesic, (BENGAY EX) Apply 1 Application topically daily as needed (pain).     pantoprazole (PROTONIX) 40 MG tablet Take 1 tablet (40 mg total) by mouth daily. 30 tablet 0   potassium chloride (KLOR-CON) 10 MEQ tablet Take 10 mEq by mouth daily.     Prenatal Vit-Fe Fumarate-FA (PRENATAL PO) Take 1 tablet by mouth daily.     promethazine (PHENERGAN) 12.5 MG tablet Take 1 tablet (12.5 mg total) by mouth every 8 (eight) hours as needed for nausea or vomiting. 20 tablet 1   senna (SENNA-TIME) 8.6 MG tablet      zinc gluconate 50 MG tablet Take 50 mg by mouth daily.     cefTRIAXone (ROCEPHIN) 2 g injection  (Patient not taking: Reported on 12/22/2022)     cefTRIAXone 2 g in sodium chloride 0.9 % 100 mL Inject 2 g into the vein daily. (Patient not taking: Reported on 12/22/2022)     vancomycin IVPB Inject 750 mg into the  vein every 12 (twelve) hours. Indication:  osteomyelitis s/p AKA with retained hardware First Dose: No Last Day of Therapy:  12/15/22 Labs - Sunday/Monday:  CBC/D, BMP, and vancomycin trough. Labs - Thursday:  BMP and vancomycin trough Labs - Once weekly: ESR and CRP Method of administration:Elastomeric Method of administration may be changed at the discretion of the patient and/or caregiver's ability to self-administer the medication ordered. (Patient not taking: Reported on 12/22/2022) 66 Units 0   No facility-administered medications prior to visit.     Social History   Socioeconomic History   Marital status: Significant Other    Spouse name: Not on file   Number of children: 2   Years of education: Not on file   Highest education level: Not on file  Occupational History   Occupation: diabled  Tobacco  Use   Smoking status: Former    Current packs/day: 1.50    Average packs/day: 1.5 packs/day for 40.0 years (60.0 ttl pk-yrs)    Types: Cigarettes   Smokeless tobacco: Never   Tobacco comments:    has used e-cigarettes, no vaping   Vaping Use   Vaping status: Never Used  Substance and Sexual Activity   Alcohol use: Not Currently   Drug use: No   Sexual activity: Not Currently  Other Topics Concern   Not on file  Social History Narrative   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   Caffeine: 1 cup in the morning    Social Determinants of Health   Financial Resource Strain: Not on file  Food Insecurity: No Food Insecurity (11/11/2022)   Hunger Vital Sign    Worried About Running Out of Food in the Last Year: Never true    Ran Out of Food in the Last Year: Never true  Transportation Needs: No Transportation Needs (11/11/2022)   PRAPARE - Administrator, Civil Service (Medical): No    Lack of Transportation (Non-Medical): No  Physical Activity: Not on file  Stress: Not on file  Social Connections: Not on file  Intimate Partner Violence: Not  At Risk (11/11/2022)   Humiliation, Afraid, Rape, and Kick questionnaire    Fear of Current or Ex-Partner: No    Emotionally Abused: No    Physically Abused: No    Sexually Abused: No      Review of Systems    All other ros negative   Objective:    BP 134/74   Pulse 70   Temp 97.8 F (36.6 C) (Temporal)   SpO2 98%  Nursing note and vital signs reviewed.  Physical Exam     General/constitutional: no distress, pleasant HEENT: Normocephalic, PER, Conj Clear, EOMI, Oropharynx clear Neck supple CV: rrr no mrg Lungs: clear to auscultation, normal respiratory effort Abd: Soft, Nontender Ext: no edema Skin: No Rash Neuro: nonfocal MSK: left knee stump incision healed   Labs: Lab Results  Component Value Date   WBC 5.5 12/01/2022   HGB 10.0 (L) 12/01/2022   HCT 32.3 (L) 12/01/2022   MCV 86.6 12/01/2022   PLT 364 12/01/2022   Last metabolic panel Lab Results  Component Value Date   GLUCOSE 107 (H) 11/23/2022   NA 139 11/23/2022   K 3.8 11/23/2022   CL 104 11/23/2022   CO2 24 11/23/2022   BUN 15 11/23/2022   CREATININE 0.86 11/23/2022   GFRNONAA >60 11/23/2022   CALCIUM 9.5 11/23/2022   PHOS 3.9 06/15/2022   PROT 5.8 (L) 11/14/2022   ALBUMIN 2.3 (L) 11/14/2022   LABGLOB 3.3 12/01/2018   BILITOT 0.3 11/14/2022   ALKPHOS 91 11/14/2022   AST 17 11/14/2022   ALT 16 11/14/2022   ANIONGAP 11 11/23/2022    Lab Results  Component Value Date   CRP 9.6 (H) 11/03/2022     Micro:  Serology:  Imaging:  Assessment & Plan:   Problem List Items Addressed This Visit   None         No orders of the defined types were placed in this encounter.    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 Likely will need long term antibiotics suppression   ------ 5/2 assessment Needing more I&D with plastic surgery As above 4/25 and 4/28 operative cx e faecalis   Diarrhea with amp-sulb   Will change abx to ampicillin alone. She'll need long term suppressive amoxicillin after 6 weeks of ampicillin, and can start clock on 4/28       ---------------- 07/09/22 id assessment Patient remains on iv ampicillin via picc without adverse effect She has been seen at surgery clinic recently for wound vac change since discharged from hospital on 06/18/22 She had no diarrhea or n/v/rash  Her last opat labs 5/28 reviewed Crp 2 (<10); sed rate 65  Lft 11/15/88/0.2 Cbc7/10/300; cr 0.63; alb 4  08/25/22 id clinic assessment Concerning purulence/pain and still not yet all closed up wound Labs today to assess abx toxicity/inflammatory changes Phenergan refill If I&D needed will likely need to switch back to iv abx for a few weeks before going to amox again. And she should be seen by inpatient ID team Otherwise see me in 3-4 weeks Continue amox for now   09/18/22 id clinic assessment Patient had plastic surgery debridement of the wound skin/soft tissue and placement of another graft along with myriad. I reviewed operative note. 1 cm opening noted within the knee joint with hardware exposed. No pus described during surgery No cultures done She said she feels very good with the knee much better in terms of pain/discomfort No purulence Minimal output into the wound vac She  continues on amoxicillin  Will get labs next week and see her in 6-8 week  11/03/22 id clinic assessment Patient to have aka tomorrow with dr [name]. She continues to have purulent drainage in the wound vac; and the wound doesn't heal; and there is significant pain in the infected area. Skin graft trial previously from plastic surgery and that failed as well. The hardware remains exposed  Inpatient id team will need to see patient when she is getting amputation (tomorrow 9/25) and culture would need to be sent. She'll need targetted therapy for at least another 6 weeks after amputation and hopefully all the hardware will be removed    F/u with me 2-3 weeks after release from hospital   12/01/22 id clinic assessment Patient s/p aka 11/04/22, but still with some retained femoral hardware (femoral rod attempted to be removed unsucessfully so distal part sawed off); operative cx negative She was discharged on 6 weeks vanc/ceftriaxone to finish 11/05 Stump looks great no pain -- staples removed 11/30/22 Reviewed recent labs from 11/23/22 Reviewed opat labs from 10/17: cbc 1.7/15/192 (lymph 300; neutrophil 1100); cr 0.6; no lft; crp 10 (<10) Wbc trending down ?abx effect -- will repeat cbc here today F/u 3-4 weeks On 11/5 hh can remove picc and she can resume amox 1000 mg tid for another 6 weeks before suppressing at likely 1 gram bid  12/21/21 id clinic assessment Patient had started taking doxy and amoxicillin/clav We'll plan for 6 more weeks for total 12 weeks then go to amoxicillin 1 gram twice a day suppression Reviewed labs  Her stump incision looks great no dehiscence or tenderness Finish these 2 antibiotics on 12/17; on 12/17 transition to amoxicillin 1000 mg twice a day  We might consider stopping amoxicillin at 6-12 months and we'll discuss  Follow-up: Return in about 4 weeks (around 01/19/2023).      Raymondo Band, MD [facility] 12/22/2022, 2:55 PM

## 2022-12-23 ENCOUNTER — Telehealth: Payer: Self-pay

## 2022-12-23 ENCOUNTER — Other Ambulatory Visit: Payer: Self-pay

## 2022-12-23 ENCOUNTER — Other Ambulatory Visit: Payer: Self-pay | Admitting: Internal Medicine

## 2022-12-23 MED ORDER — PROMETHAZINE HCL 25 MG PO TABS: 25.0000 mg | ORAL_TABLET | Freq: Four times a day (QID) | ORAL | 0 refills | Status: DC | PRN

## 2022-12-23 NOTE — Telephone Encounter (Signed)
Message sent to provider, awaiting response

## 2022-12-23 NOTE — Telephone Encounter (Signed)
Patient called complaining of nausea with taking the doxy. Patient reports she has some phenergan, but will need the RX renewed if you want her to continue the doxy.  Please advise.  Kelee Cunningham T Pricilla Loveless

## 2022-12-23 NOTE — Telephone Encounter (Signed)
Rx sent in for the patient and patient informed

## 2022-12-24 ENCOUNTER — Ambulatory Visit: Payer: Medicare Other | Admitting: Orthopedic Surgery

## 2022-12-25 ENCOUNTER — Ambulatory Visit: Payer: Medicare Other | Admitting: Orthopedic Surgery

## 2022-12-25 ENCOUNTER — Encounter: Payer: Medicare Other | Attending: Physical Medicine & Rehabilitation | Admitting: Physical Medicine & Rehabilitation

## 2022-12-25 ENCOUNTER — Encounter: Payer: Self-pay | Admitting: Physical Medicine & Rehabilitation

## 2022-12-25 VITALS — BP 135/69 | HR 60 | Ht 62.0 in | Wt 153.0 lb

## 2022-12-25 DIAGNOSIS — G548 Other nerve root and plexus disorders: Secondary | ICD-10-CM | POA: Insufficient documentation

## 2022-12-25 DIAGNOSIS — Z89612 Acquired absence of left leg above knee: Secondary | ICD-10-CM | POA: Diagnosis not present

## 2022-12-25 DIAGNOSIS — R52 Pain, unspecified: Secondary | ICD-10-CM | POA: Diagnosis present

## 2022-12-25 DIAGNOSIS — I10 Essential (primary) hypertension: Secondary | ICD-10-CM | POA: Diagnosis present

## 2022-12-25 IMAGING — US US BREAST*L* LIMITED INC AXILLA
1 series · 13 of 15 positions shown · non-contrast
Comparison: Previous exams including recent screening mammogram
dated 06/05/2020.

CLINICAL DATA: Patient returns today to evaluate a possible LEFT
breast mass identified on recent screening

EXAM:
DIGITAL DIAGNOSTIC UNILATERAL LEFT MAMMOGRAM WITH TOMOSYNTHESIS AND
CAD; ULTRASOUND LEFT BREAST LIMITED
TECHNIQUE: Left digital diagnostic mammography and breast tomosynthesis was
performed. The images were evaluated with computer-aided detection.;
Targeted ultrasound examination of the left breast was performed

[Series 1: us breast*left* limited inc axilla · 0.07mm/px · 13 of 15 slices shown]
[im 1/15]
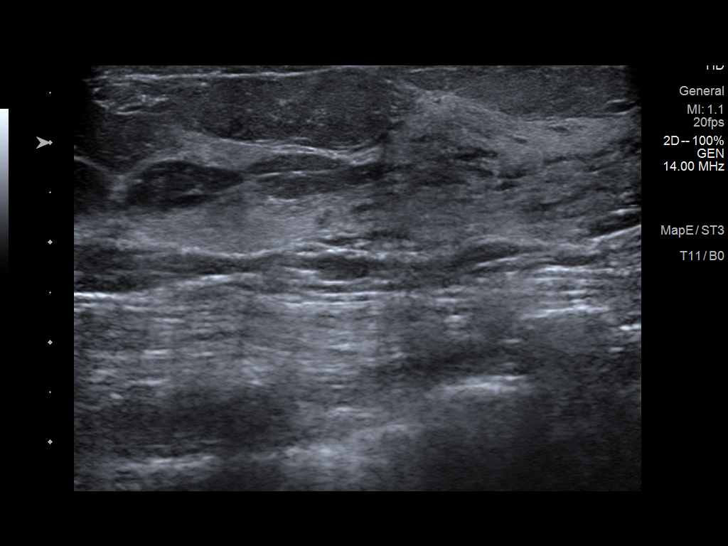
[im 2/15]
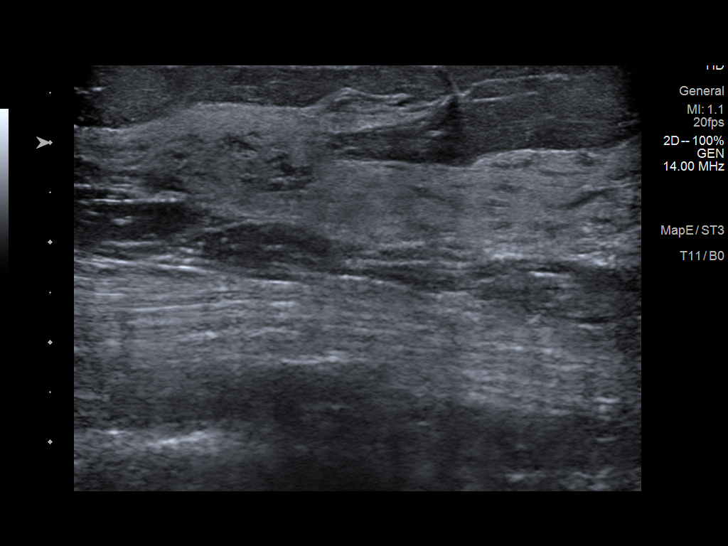
[im 3/15]
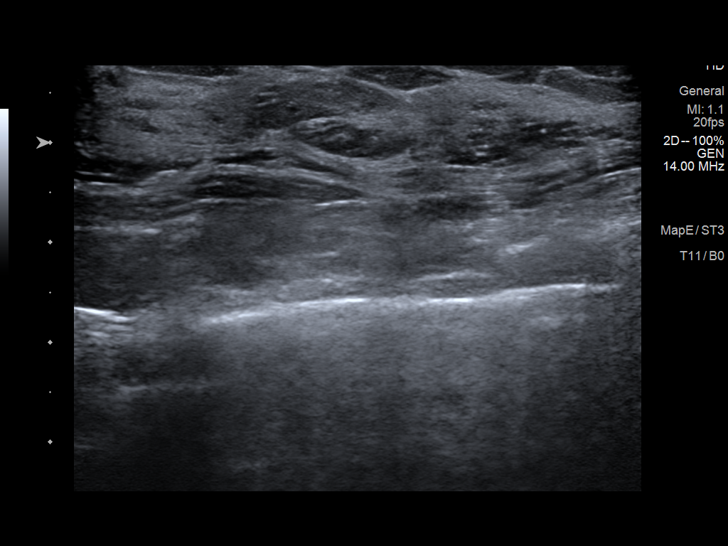
[im 5/15]
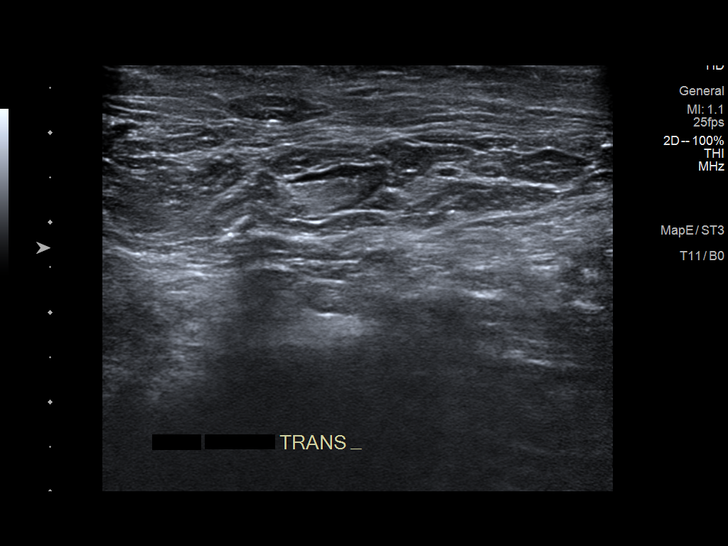
[im 6/15]
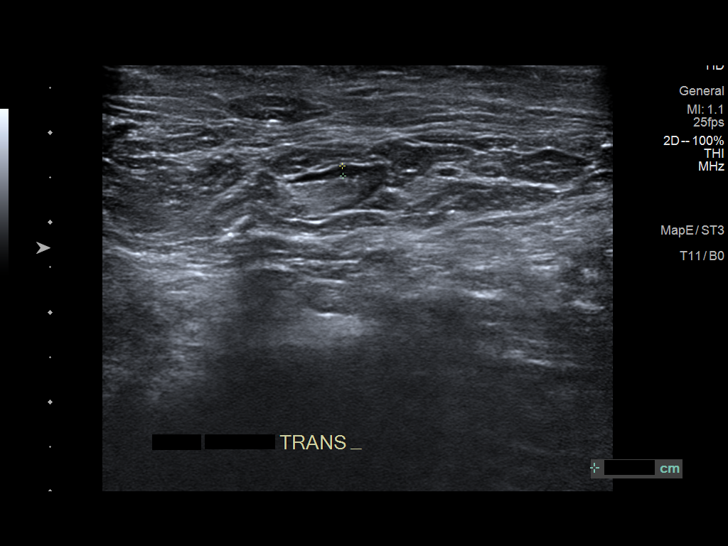
[im 7/15]
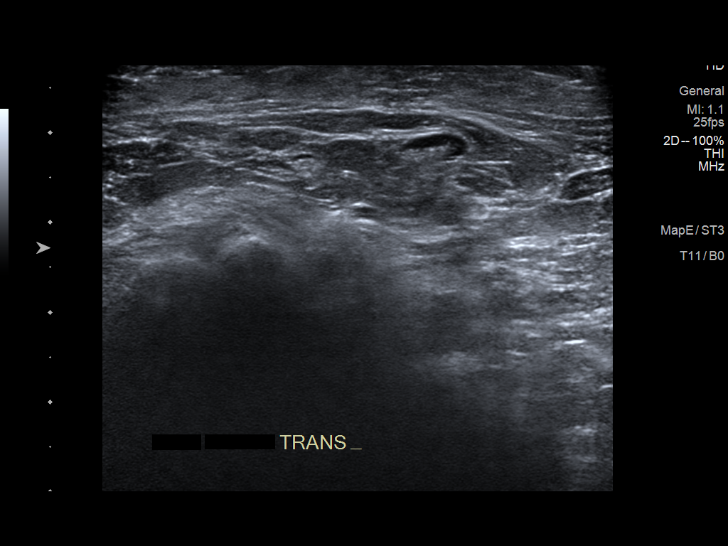
[im 8/15]
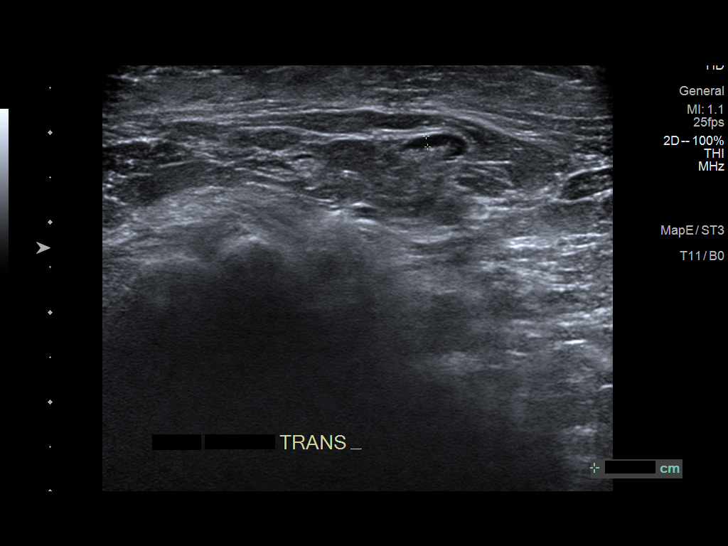
[im 9/15]
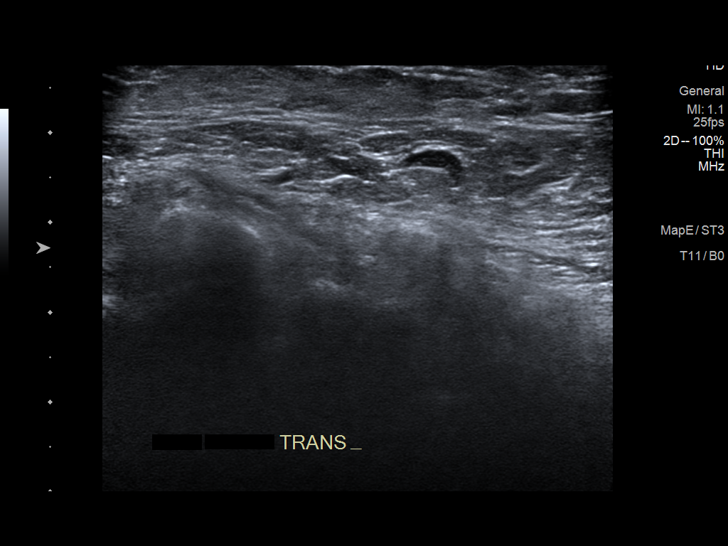
[im 10/15]
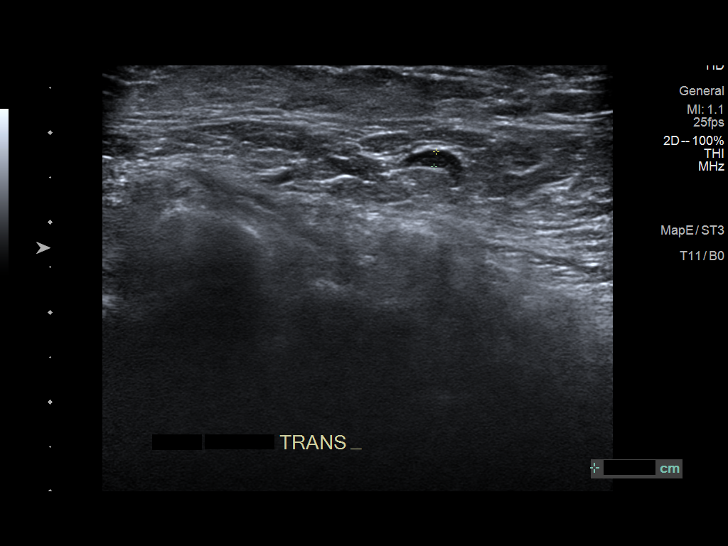
[im 11/15]
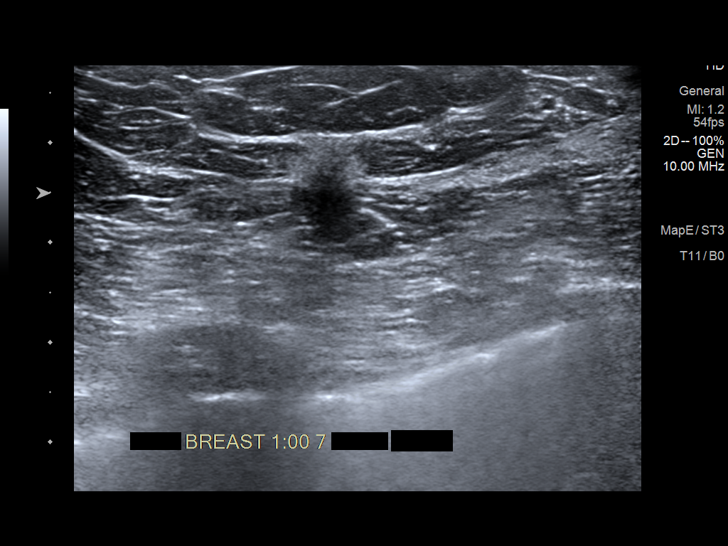
[im 13/15]
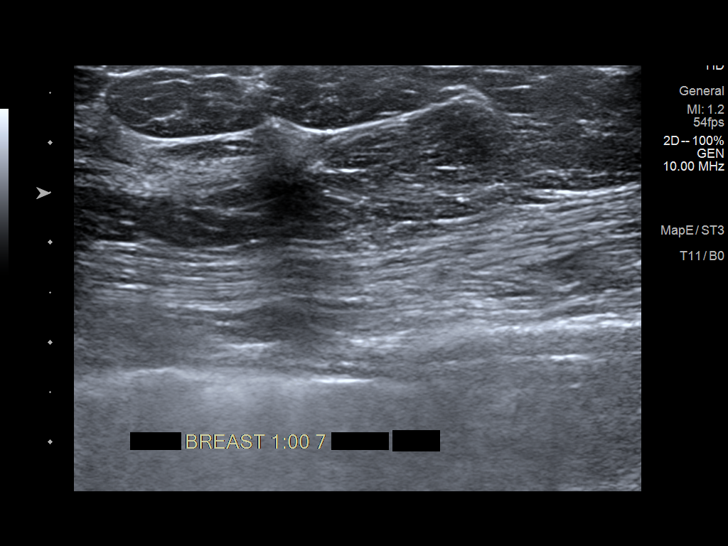
[im 14/15]
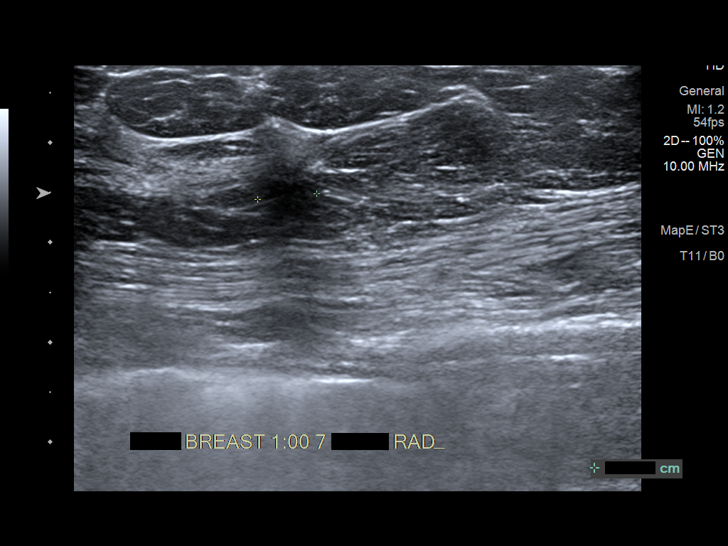
[im 15/15]
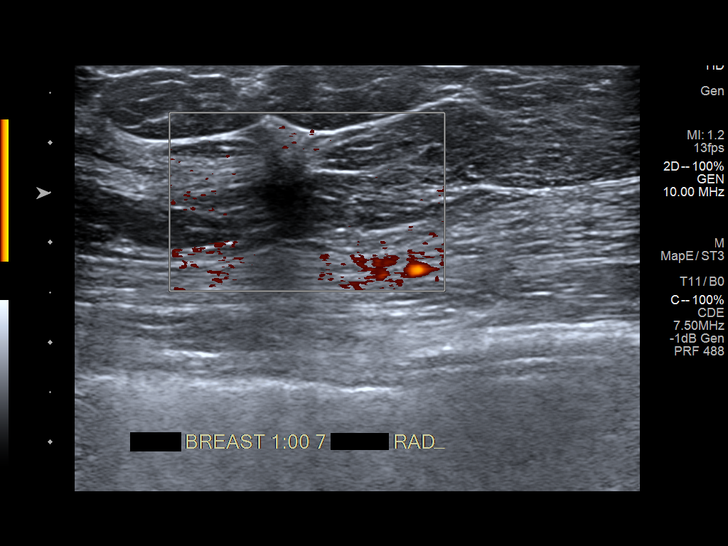

[13 of 15 positions shown; findings below may reference images not displayed]

ACR Breast Density Category c: The breast tissue is heterogeneously
dense, which may obscure small masses.
FINDINGS: LEFT breast diagnostic mammogram: On today's additional diagnostic
views, there is a spiculated mass within the outer LEFT breast, 1-2
o'clock axis region, with associated architectural distortion,
measuring approximately 4 mm greatest dimension.

Targeted ultrasound is performed, showing a spiculated mass in the
LEFT breast at the 1 o'clock axis, 7 cm from the nipple, measuring 9
x 6 x 7 mm, corresponding to the mammographic finding.

LEFT axilla was evaluated with ultrasound showing no enlarged or
morphologically abnormal lymph nodes.
IMPRESSION: Highly suspicious mass within the LEFT breast at the 1 o'clock axis,
7 cm from the nipple, measuring 9 mm, corresponding to the
mammographic finding. Ultrasound-guided biopsy is recommended.

RECOMMENDATION:
Ultrasound-guided biopsy of the LEFT breast mass at the 1 o'clock
axis.

Ultrasound-guided biopsy is scheduled for [REDACTED].

I have discussed the findings and recommendations with the patient.
If applicable, a reminder letter will be sent to the patient
regarding the next appointment.

BI-RADS CATEGORY  5: Highly suggestive of malignancy.

## 2022-12-25 MED ORDER — PREGABALIN 150 MG PO CAPS: 150.0000 mg | ORAL_CAPSULE | Freq: Two times a day (BID) | ORAL | 3 refills | Status: DC

## 2022-12-25 NOTE — Progress Notes (Unsigned)
Subjective:    Patient ID: Tammy [name], female    DOB: 08/23/1955, 67 y.o.   MRN: [id number]  HPI  Hospital discharge summary 11/23/2022    Brief HPI:   Tammy [name] is a 67 y.o. female who underwent open reduction of her distal femur fracture April 10, 2021. Patient eventually had the hardware removed in March 2024 with irrigation and debridement with Dr. [name] 3 separate times as well as distal femur replacement with total knee arthroplasty. Patient has undergone 2 wound debridements and application of myriad with Dr. Ulice Bold on May 6 and August 5. She continued to have purulent drainage in the wound vac; and the wound did not heal with significant pain in the infected area. Skin graft trial previously from plastic surgery failed as well. The hardware remained exposed. She underwent left AKA by Dr. [name] on 11/04/2022. Wound VAC removed 10/02. The amputation site wound cultures remain negative. She will continue on vancomycin and ceftriaxone through 11/05 and follow with ID. Tolerating diet. She continues to progress ambulation in the hallway with RW CGA/Min A.      Hospital Course: [name] was admitted to rehab 11/13/2022 for inpatient therapies to consist of PT, ST and OT at least three hours five days a week. Past admission physiatrist, therapy team and rehab RN have worked together to provide customized collaborative inpatient rehab. Phantom pain addressed with increase in gabapentin dosing. On 10/05, was notifed by vascular lab that dopplers are positive for DVT involving the femoral vein but not the deep or common femoral veins. Treatment dose Lovenox started. Pharmacy consult placed. Continue bedrest through 10/06. Increased K-dur from 10 to 20 mEq on 10/07. Neuropsychology consultation on 10/09. Tolerating shrinker sock better. Oral iron continued and hemoglobin showed slow improvement. No results entered for hemoccult stool as ordered on admission.  No evidence of bleeding or blood loss. Follow-up labs with normal potassium level. Will continue 10 mEq daily at discharge. Convert from Lovenox to Eliquis for DVT treatment. Hemoglobin continues to improve. Continue prenatal home vitamin which contains iron.    Blood pressures were monitored on TID basis and amlodipine 10 mg daily and hydralazine 25 mg BID continued. 10/8 Increase hydralazine to 25 mg 3 times daily. Increased further to 37.2 mg TID on 10/10. Increased again on 10/11 to 50 mg TID. Increased again on 10/13 to 75 mg TID.     Interval history 11/27/2022 Patient is here to follow-up after completing debilitation at [facility]. Patient reports she is doing well overall since her discharge.  She is having difficulty with phantom pain during the day and particularly at night.  She continues to use oxycodone for residual limb pain also, she reports she would like to gradually wean off of this over time.  She reports visits with Dr. [name] and Dr. [name] scheduled next week.  Mood is doing okay on Wellbutrin.  Pain Inventory Average Pain 7 Pain Right Now 2 My pain is intermittent, sharp, burning, and stabbing  In the last 24 hours, has pain interfered with the following? General activity 7 Relation with others 4 Enjoyment of life 4  What TIME of day is your pain at its worst? night Sleep (in general) Poor  Pain is worse with: some activites Pain improves with: medication and rubbing the area, medication Relief from Meds: 5      Family History  Problem Relation Age of Onset   Breast cancer Mother    Other Mother  blood clots   Diabetes Sister    Neuropathy Sister        diabetic neuropathy   Breast cancer Maternal Aunt        10   Colon cancer Maternal Aunt 33   Diabetes Paternal Aunt    Diabetes Brother    Neuropathy Brother        diabetic neuropathy   Breast cancer Other        PGM's sister; dx mid 16s   Colon cancer Other        PGM's brother; dx 22s   Ovarian  cancer Other        PGM's mother; dx unknown age   Esophageal cancer Neg Hx    Rectal cancer Neg Hx    Stomach cancer Neg Hx    Colon polyps Neg Hx    Social History   Socioeconomic History   Marital status: Significant Other    Spouse name: Not on file   Number of children: 2   Years of education: Not on file   Highest education level: Not on file  Occupational History   Occupation: diabled  Tobacco Use   Smoking status: Former    Current packs/day: 1.50    Average packs/day: 1.5 packs/day for 40.0 years (60.0 ttl pk-yrs)    Types: Cigarettes   Smokeless tobacco: Never   Tobacco comments:    has used e-cigarettes, no vaping   Vaping Use   Vaping status: Never Used  Substance and Sexual Activity   Alcohol use: Not Currently   Drug use: No   Sexual activity: Not Currently  Other Topics Concern   Not on file  Social History Narrative   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   Caffeine: 1 cup in the morning    Social Determinants of Health   Financial Resource Strain: Not on file  Food Insecurity: No Food Insecurity (11/11/2022)   Hunger Vital Sign    Worried About Running Out of Food in the Last Year: Never true    Ran Out of Food in the Last Year: Never true  Transportation Needs: No Transportation Needs (11/11/2022)   PRAPARE - Administrator, Civil Service (Medical): No    Lack of Transportation (Non-Medical): No  Physical Activity: Not on file  Stress: Not on file  Social Connections: Not on file   Past Surgical History:  Procedure Laterality Date   AMPUTATION Left 11/04/2022   Procedure: LEFT ABOVE KNEE AMPUTATION;  Surgeon: Nadara Mustard, MD;  Location: [facility] OR;  Service: Orthopedics;  Laterality: Left;   ANKLE FRACTURE SURGERY Left 2018   APPLICATION OF WOUND VAC Left 06/15/2022   Procedure: APPLICATION OF WOUND VAC;  Surgeon: Peggye Form, DO;  Location: [facility]S;  Service: Plastics;  Laterality: Left;   APPLICATION OF  WOUND VAC Left 09/14/2022   Procedure: APPLICATION OF WOUND VAC;  Surgeon: Peggye Form, DO;  Location: [facility] OR;  Service: Plastics;  Laterality: Left;   BREAST LUMPECTOMY Left 07/2020   BREAST LUMPECTOMY WITH RADIOACTIVE SEED AND SENTINEL LYMPH NODE BIOPSY Left 07/26/2020   Procedure: LEFT BREAST LUMPECTOMY WITH RADIOACTIVE SEED AND SENTINEL LYMPH NODE BIOPSY;  Surgeon: Abigail Miyamoto, MD;  Location: [facility];  Service: General;  Laterality: Left;   CESAREAN SECTION  1986   COLONOSCOPY     DIAGNOSTIC LAPAROSCOPY  1985   HARDWARE REMOVAL Left 04/30/2022   Procedure: HARDWARE REMOVAL LEFT FEMUR;  Surgeon: Samson Frederic, MD;  Location: [facility];  Service: Orthopedics;  Laterality: Left;  150   HARDWARE REMOVAL Left 11/04/2022   Procedure: HARDWARE REMOVAL INFECTED LEFT TOTAL KNEE ARTHROPLASTY;  Surgeon: Nadara Mustard, MD;  Location: [facility] OR;  Service: Orthopedics;  Laterality: Left;   I & D KNEE WITH POLY EXCHANGE Left 06/07/2022   Procedure: IRRIGATION AND DEBRIDEMENT KNEE WITH POLY EXCHANGE;  Surgeon: Samson Frederic, MD;  Location: [facility];  Service: Orthopedics;  Laterality: Left;   INCISION AND DRAINAGE OF WOUND Left 06/04/2022   Procedure: IRRIGATION AND DEBRIDEMENT KNEE;  Surgeon: Samson Frederic, MD;  Location: [facility];  Service: Orthopedics;  Laterality: Left;  60   INCISION AND DRAINAGE OF WOUND Left 06/15/2022   Procedure: excision of knee wound with Myriad;  Surgeon: Peggye Form, DO;  Location: [facility];  Service: Plastics;  Laterality: Left;   ORIF FEMUR FRACTURE Left 04/10/2021   Procedure: OPEN REDUCTION INTERNAL FIXATION (ORIF) DISTAL FEMUR FRACTURE;  Surgeon: Myrene Galas, MD;  Location: [facility] OR;  Service: Orthopedics;  Laterality: Left;   PARTIAL KNEE ARTHROPLASTY Left 04/30/2022   Procedure: DISTAL FEMUR REPLACEMENT;  Surgeon: Samson Frederic, MD;  Location: [facility];  Service: Orthopedics;  Laterality: Left;  150   Past Medical History:  Diagnosis  Date   Anemia    hx of   Anxiety    on meds   Arthritis    [name]/bilateral ankles   Bipolar disorder (HCC)    Breast cancer (HCC) 2022   LEFT breast-radiation   Depression    on meds   Family history of breast cancer 07/10/2020   Family history of colon cancer 07/10/2020   Family history of ovarian cancer 07/10/2020   GERD (gastroesophageal reflux disease)    on meds   Heart murmur    dx by PCP   Hepatitis C    HTN (hypertension)    on meds   Hx of radiation therapy    Liver cirrhosis (HCC)    from hep c   Neuromuscular disorder (HCC)    Osteopenia    Personal history of radiation therapy    Stroke (HCC) 09/2012   slurred speech and LEFT sided weakness/notes 10/04/2012   There were no vitals taken for this visit.  Opioid Risk Score:   Fall Risk Score:  `1  Depression screen [facility] 2/9     11/27/2022    2:48 PM 11/03/2022    3:04 PM  Depression screen PHQ 2/9  Decreased Interest 0 0  Down, Depressed, Hopeless 0 0  PHQ - 2 Score 0 0  Altered sleeping 3   Tired, decreased energy 0   Change in appetite 0   Feeling bad or failure about yourself  0   Trouble concentrating 0   Moving slowly or fidgety/restless 0   Suicidal thoughts 0   PHQ-9 Score 3     Review of Systems  Musculoskeletal:  Positive for gait problem.  Neurological:  Positive for weakness and numbness.       Tingling  Psychiatric/Behavioral:         Anxiety  All other systems reviewed and are negative.      Objective:   Physical Exam   Gen: no distress, normal appearing HEENT: oral mucosa pink and moist, NCAT Cardio: Reg rate Chest: normal effort, normal rate of breathing Abd: soft, non-distended Ext: L AKA Psych: pleasant, normal affect Skin: L AKA, no signs of infection or drainage noted at incision  Neuro: alert  and awake, follows commands, CN grossly intact Strength 5/5 b/l UE, RLE 5/5 Able to left LLE to gravity Sensory exam normal for LT  Musculoskeletal: No joint swelling or  tendereness noted      Assessment & Plan:    1. 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 [name] next week   -Cont PT/OT   2.  Left femoral DVT -Continue eliquis   3. Pain Management:              -increased gabapentin to 600mg  TID for phantom pain   -Consider cymbalta    4. History of bipolar. She reports good well controlled on Wellbutrin SR 150 mg BID             -Denies SI or HI  -F/U with PCP   5. Hypokalemia   -K+ stable on 10/14. F/u PCP    6: Hypertension   -Well controlled today             12/22/2022    2:28 PM 12/01/2022    9:08 AM 11/27/2022    2:04 PM  Vitals with BMI  Height   5\' 2"   Weight  153 lbs 147 lbs  BMI  27.98 26.88  Systolic 134 141 409  Diastolic 74 79 73  Pulse 70 73 79    7: Hardware associated osteomyelitis; 6 weeks abx end date 11/05             -continue abx per ID, She reports f/u with ID next week   11/8 called pt, pain doing better with increase in gabapentin She says she does not need refill eliquis, she plans to f/u with PCP for further refills

## 2022-12-27 NOTE — Progress Notes (Incomplete)
Subjective:    Patient ID: Tammy [name], female    DOB: Aug 10, 1955, 67 y.o.   MRN: [id number]  HPI  Hospital discharge summary 11/23/2022    Brief HPI:   Tammy [name] is a 67 y.o. female who underwent open reduction of her distal femur fracture April 10, 2021. Patient eventually had the hardware removed in March 2024 with irrigation and debridement with Dr. [name] 3 separate times as well as distal femur replacement with total knee arthroplasty. Patient has undergone 2 wound debridements and application of myriad with Dr. Ulice Bold on May 6 and August 5. She continued to have purulent drainage in the wound vac; and the wound did not heal with significant pain in the infected area. Skin graft trial previously from plastic surgery failed as well. The hardware remained exposed. She underwent left AKA by Dr. [name] on 11/04/2022. Wound VAC removed 10/02. The amputation site wound cultures remain negative. She will continue on vancomycin and ceftriaxone through 11/05 and follow with ID. Tolerating diet. She continues to progress ambulation in the hallway with RW CGA/Min A.      Hospital Course: Tammy [name] was admitted to rehab 11/13/2022 for inpatient therapies to consist of PT, ST and OT at least three hours five days a week. Past admission physiatrist, therapy team and rehab RN have worked together to provide customized collaborative inpatient rehab. Phantom pain addressed with increase in gabapentin dosing. On 10/05, was notifed by vascular lab that dopplers are positive for DVT involving the femoral vein but not the deep or common femoral veins. Treatment dose Lovenox started. Pharmacy consult placed. Continue bedrest through 10/06. Increased K-dur from 10 to 20 mEq on 10/07. Neuropsychology consultation on 10/09. Tolerating shrinker sock better. Oral iron continued and hemoglobin showed slow improvement. No results entered for hemoccult stool as ordered on admission.  No evidence of bleeding or blood loss. Follow-up labs with normal potassium level. Will continue 10 mEq daily at discharge. Convert from Lovenox to Eliquis for DVT treatment. Hemoglobin continues to improve. Continue prenatal home vitamin which contains iron.    Blood pressures were monitored on TID basis and amlodipine 10 mg daily and hydralazine 25 mg BID continued. 10/8 Increase hydralazine to 25 mg 3 times daily. Increased further to 37.2 mg TID on 10/10. Increased again on 10/11 to 50 mg TID. Increased again on 10/13 to 75 mg TID.     Interval history 11/27/2022 Patient is here to follow-up after completing debilitation at [facility]. Patient reports she is doing well overall since her discharge.  She is having difficulty with phantom pain during the day and particularly at night.  She continues to use oxycodone for residual limb pain also, she reports she would like to gradually wean off of this over time.  She reports visits with Dr. [name] and Dr. [name] scheduled next week.  Mood is doing okay on Wellbutrin.  Interval history 12/25/22    Pain Inventory Average Pain 7 Pain Right Now 2 My pain is intermittent, sharp, burning, and stabbing  In the last 24 hours, has pain interfered with the following? General activity 7 Relation with others 4 Enjoyment of life 4  What TIME of day is your pain at its worst? night Sleep (in general) Poor  Pain is worse with: some activites Pain improves with: medication and rubbing the area, medication Relief from Meds: 5      Family History  Problem Relation Age of Onset  . Breast cancer Mother   .  Other Mother        blood clots  . Diabetes Sister   . Neuropathy Sister        diabetic neuropathy  . Breast cancer Maternal Aunt        50  . Colon cancer Maternal Aunt 69  . Diabetes Paternal Aunt   . Diabetes Brother   . Neuropathy Brother        diabetic neuropathy  . Breast cancer Other        PGM's sister; dx mid 59s  . Colon cancer  Other        PGM's brother; dx 44s  . Ovarian cancer Other        PGM's mother; dx unknown age  . Esophageal cancer Neg Hx   . Rectal cancer Neg Hx   . Stomach cancer Neg Hx   . Colon polyps Neg Hx    Social History   Socioeconomic History  . Marital status: Significant Other    Spouse name: Not on file  . Number of children: 2  . Years of education: Not on file  . Highest education level: Not on file  Occupational History  . Occupation: diabled  Tobacco Use  . Smoking status: Former    Current packs/day: 1.50    Average packs/day: 1.5 packs/day for 40.0 years (60.0 ttl pk-yrs)    Types: Cigarettes  . Smokeless tobacco: Never  . Tobacco comments:    has used e-cigarettes, no vaping   Vaping Use  . Vaping status: Never Used  Substance and Sexual Activity  . Alcohol use: Not Currently  . Drug use: No  . Sexual activity: Not Currently  Other Topics Concern  . 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 Worried About Programme researcher, broadcasting/film/video in the Last Year: Never true   . Ran Out of Food in the Last Year: Never true  Transportation Needs: No Transportation Needs (11/11/2022)   PRAPARE - Transportation   . Lack of Transportation (Medical): No   . Lack of Transportation (Non-Medical): No  Physical Activity: Not on file  Stress: Not on file  Social Connections: Not on file   Past Surgical History:  Procedure Laterality Date  . AMPUTATION Left 11/04/2022   Procedure: LEFT ABOVE KNEE AMPUTATION;  Surgeon: Nadara Mustard, MD;  Location: [facility] OR;  Service: Orthopedics;  Laterality: Left;  . ANKLE FRACTURE SURGERY Left 2018  . APPLICATION OF WOUND VAC Left 06/15/2022   Procedure: APPLICATION OF WOUND VAC;  Surgeon: Peggye Form, DO;   Location: [facility];  Service: Plastics;  Laterality: Left;  . APPLICATION OF WOUND VAC Left 09/14/2022   Procedure: APPLICATION OF WOUND VAC;  Surgeon: Peggye Form, DO;  Location: [facility] OR;  Service: Plastics;  Laterality: Left;  . BREAST LUMPECTOMY Left 07/2020  . BREAST LUMPECTOMY WITH RADIOACTIVE SEED AND SENTINEL LYMPH NODE BIOPSY Left 07/26/2020   Procedure: LEFT BREAST LUMPECTOMY WITH RADIOACTIVE SEED AND SENTINEL LYMPH NODE BIOPSY;  Surgeon: Abigail Miyamoto, MD;  Location: [facility];  Service: General;  Laterality: Left;  . CESAREAN SECTION  1986  . COLONOSCOPY    . DIAGNOSTIC LAPAROSCOPY  1985  . HARDWARE REMOVAL Left  04/30/2022   Procedure: HARDWARE REMOVAL LEFT FEMUR;  Surgeon: Samson Frederic, MD;  Location: [facility];  Service: Orthopedics;  Laterality: Left;  150  . HARDWARE REMOVAL Left 11/04/2022   Procedure: HARDWARE REMOVAL INFECTED LEFT TOTAL KNEE ARTHROPLASTY;  Surgeon: Nadara Mustard, MD;  Location: [facility] OR;  Service: Orthopedics;  Laterality: Left;  . I & D KNEE WITH POLY EXCHANGE Left 06/07/2022   Procedure: IRRIGATION AND DEBRIDEMENT KNEE WITH POLY EXCHANGE;  Surgeon: Samson Frederic, MD;  Location: [facility];  Service: Orthopedics;  Laterality: Left;  . INCISION AND DRAINAGE OF WOUND Left 06/04/2022   Procedure: IRRIGATION AND DEBRIDEMENT KNEE;  Surgeon: Samson Frederic, MD;  Location: [facility];  Service: Orthopedics;  Laterality: Left;  60  . INCISION AND DRAINAGE OF WOUND Left 06/15/2022   Procedure: excision of knee wound with Myriad;  Surgeon: Peggye Form, DO;  Location: [facility];  Service: Plastics;  Laterality: Left;  . ORIF FEMUR FRACTURE Left 04/10/2021   Procedure: OPEN REDUCTION INTERNAL FIXATION (ORIF) DISTAL FEMUR FRACTURE;  Surgeon: Myrene Galas, MD;  Location: [facility] OR;  Service: Orthopedics;  Laterality: Left;  . PARTIAL KNEE ARTHROPLASTY Left 04/30/2022   Procedure: DISTAL FEMUR REPLACEMENT;  Surgeon: Samson Frederic, MD;  Location: [facility];  Service: Orthopedics;  Laterality: Left;  150   Past Medical History:  Diagnosis Date  . Anemia    hx of  . Anxiety    on meds  . Arthritis    [name]/bilateral ankles  . Bipolar disorder (HCC)   . Breast cancer (HCC) 2022   LEFT breast-radiation  . Depression    on meds  . Family history of breast cancer 07/10/2020  . Family history of colon cancer 07/10/2020  . Family history of ovarian cancer 07/10/2020  . GERD (gastroesophageal reflux disease)    on meds  . Heart murmur    dx by PCP  . Hepatitis C   . HTN (hypertension)    on meds  . Hx of radiation therapy   . Liver cirrhosis (HCC)    from hep c  . Neuromuscular disorder (HCC)   . Osteopenia   . Personal history of radiation therapy   . Stroke (HCC) 09/2012   slurred speech and LEFT sided weakness/notes 10/04/2012   There were no vitals taken for this visit.  Opioid Risk Score:   Fall Risk Score:  `1  Depression screen [facility] 2/9     11/27/2022    2:48 PM 11/03/2022    3:04 PM  Depression screen PHQ 2/9  Decreased Interest 0 0  Down, Depressed, Hopeless 0 0  PHQ - 2 Score 0 0  Altered sleeping 3   Tired, decreased energy 0   Change in appetite 0   Feeling bad or failure about yourself  0   Trouble concentrating 0   Moving slowly or fidgety/restless 0   Suicidal thoughts 0   PHQ-9 Score 3     Review of Systems  Musculoskeletal:  Positive for gait problem.  Neurological:  Positive for weakness and numbness.       Tingling  Psychiatric/Behavioral:         Anxiety  All other systems reviewed and are negative.      Objective:   Physical Exam   Gen: no distress, normal appearing HEENT: oral mucosa pink and moist, NCAT Cardio: Reg rate Chest: normal effort, normal rate of breathing Abd: soft, non-distended Ext: L AKA Psych: pleasant, normal affect Skin: L AKA Neuro: alert and  awake, follows commands, CN grossly intact Strength 5/5 b/l UE, RLE 5/5 Able to left LLE to gravity Sensory exam  normal for LT  Musculoskeletal: No joint swelling or tendereness noted      Assessment & Plan:    1. 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 [name] next week   -Cont PT/OT   2.  Left femoral DVT -On eliquis, f/u with PCP   3. Pain Management:              -Change to lyrica 150mg  BID, DC gabapentin    4. History of bipolar,no SI or HI   5. Hypokalemia   -K+ stable on 10/14. Continue f/u with PCP    6: Hypertension   -Well controlled today             12/22/2022    2:28 PM 12/01/2022    9:08 AM 11/27/2022    2:04 PM  Vitals with BMI  Height   5\' 2"   Weight  153 lbs 147 lbs  BMI  27.98 26.88  Systolic 134 141 161  Diastolic 74 79 73  Pulse 70 73 79    7: Hardware associated osteomyelitis             -continue abx per ID, she reports she is now on oral abx,  She reports f/u with ID next week

## 2022-12-28 ENCOUNTER — Encounter: Payer: Self-pay | Admitting: Orthopedic Surgery

## 2022-12-28 ENCOUNTER — Encounter: Payer: Self-pay | Admitting: Physical Medicine & Rehabilitation

## 2022-12-28 NOTE — Progress Notes (Signed)
Office Visit Note   Patient: Tammy [name]           Date of Birth: 10-09-1955           MRN: [id number] Visit Date: 12/25/2022              Requested by: Quitman Livings, MD [address] [place],  [place] [address] PCP: Quitman Livings, MD  Chief Complaint  Patient presents with   Left Leg - Routine Post Op    11/04/2022 left AKA      HPI: Patient is a 67 year old woman status post left above-knee amputation September 25.  Patient has followed up with [facility] for her prosthetic fitting.  She has an appointment with Zella Ball for therapy.  Assessment & Plan: Visit Diagnoses:  1. Hx of AKA (above knee amputation), left (HCC)     Plan: Patient will continue with gait training.  Follow-Up Instructions: Return in about 3 months (around 03/27/2023).   Ortho Exam  Patient is alert, oriented, no adenopathy, well-dressed, normal affect, normal respiratory effort. Examination the incision is well-healed there is no open wounds there is good consolidation.  Imaging: No results found. No images are attached to the encounter.  Labs: Lab Results  Component Value Date   HGBA1C 5.6 03/17/2022   HGBA1C 5.6 12/01/2018   HGBA1C 5.9 (H) 05/11/2015   ESRSEDRATE 38 (H) 03/17/2022   CRP 9.6 (H) 11/03/2022   CRP 68.5 (H) 09/22/2022   CRP 47.9 (H) 08/25/2022   REPTSTATUS 11/09/2022 FINAL 11/04/2022   GRAMSTAIN NO WBC SEEN NO ORGANISMS SEEN  11/04/2022   CULT  11/04/2022    No growth aerobically or anaerobically. Performed at [facility], [address], [place], [place] [address]    [facility] ENTEROCOCCUS FAECALIS 06/07/2022     Lab Results  Component Value Date   ALBUMIN 2.3 (L) 11/14/2022   ALBUMIN 4.0 10/13/2022   ALBUMIN 3.0 (L) 06/16/2022    Lab Results  Component Value Date   MG 2.0 06/16/2022   MG 2.4 06/15/2022   MG 2.1 06/13/2022   Lab Results  Component Value Date   VD25OH 10.88 (L) 04/11/2021    No results found for: "PREALBUMIN"    Latest  Ref Rng & Units 12/01/2022    9:38 AM 11/23/2022    5:46 AM 11/19/2022    3:40 AM  CBC EXTENDED  WBC 3.8 - 10.8 Thousand/uL 5.5  3.8  5.0   RBC 3.80 - 5.10 Million/uL 3.73  3.29  2.96   Hemoglobin 11.7 - 15.5 g/dL 18.8  8.8  8.1   HCT 41.6 - 45.0 % 32.3  28.8  26.1   Platelets 140 - 400 Thousand/uL 364  353  358      There is no height or weight on file to calculate BMI.  Orders:  No orders of the defined types were placed in this encounter.  No orders of the defined types were placed in this encounter.    Procedures: No procedures performed  Clinical Data: No additional findings.  ROS:  All other systems negative, except as noted in the HPI. Review of Systems  Objective: Vital Signs: There were no vitals taken for this visit.  Specialty Comments:  No specialty comments available.  PMFS History: Patient Active Problem List   Diagnosis Date Noted   Bipolar 1 disorder, depressed, partial remission (HCC) 11/18/2022   Left above-knee amputee (HCC) 11/13/2022   Infection of total knee replacement (HCC) 11/04/2022  S/P AKA (above knee amputation) unilateral, left (HCC) 11/04/2022   Chronic hepatitis C with cirrhosis (HCC) 06/06/2022   Surgical wound dehiscence, initial encounter 06/04/2022   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, left, with nonunion, subsequent encounter 04/30/2022   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 04/30/2022   Hypokalemia 04/16/2021   Hip fracture requiring operative repair (HCC) 04/16/2021   Vitamin D deficiency 04/12/2021   Hyponatremia 04/11/2021   Acute postoperative anemia due to expected blood loss 04/11/2021   Class 1 obesity 04/11/2021   Left comminuted and displaced fracture of the distal femur metaphysis 04/08/2021   History of CVA (cerebrovascular accident) 04/08/2021   Genetic testing 07/17/2020   Family history of breast cancer 07/10/2020   Family history of  ovarian cancer 07/10/2020   Family history of colon cancer 07/10/2020   Malignant neoplasm of upper-outer quadrant of left breast in female, estrogen receptor positive (HCC) 07/09/2020   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   Chronic pain syndrome 10/06/2012   Nicotine dependence 10/06/2012   Anxiety state 10/05/2012   Bipolar disorder, unspecified (HCC) 10/05/2012   Slurred speech 10/04/2012   Left-sided weakness 10/04/2012   Hyperkalemia 10/04/2012   Acute CVA (cerebrovascular accident) (HCC) 10/04/2012   Hypertension    Past Medical History:  Diagnosis Date   Anemia    hx of   Anxiety    on meds   Arthritis    back/bilateral ankles   Bipolar disorder (HCC)    Breast cancer (HCC) 2022   LEFT breast-radiation   Depression    on meds   Family history of breast cancer 07/10/2020   Family history of colon cancer 07/10/2020   Family history of ovarian cancer 07/10/2020   GERD (gastroesophageal reflux disease)    on meds   Heart murmur    dx by PCP   Hepatitis C    HTN (hypertension)    on meds   Hx of radiation therapy    Liver cirrhosis (HCC)    from hep c   Neuromuscular disorder (HCC)    Osteopenia    Personal history of radiation therapy    Stroke (HCC) 09/2012   slurred speech and LEFT sided weakness/notes 10/04/2012    Family History  Problem Relation Age of Onset   Breast cancer Mother    Other Mother        blood clots   Diabetes Sister    Neuropathy Sister        diabetic neuropathy   Breast cancer Maternal Aunt        50   Colon cancer Maternal Aunt 69   Diabetes Paternal Aunt    Diabetes Brother    Neuropathy Brother        diabetic neuropathy   Breast cancer Other        PGM's sister; dx mid 32s   Colon cancer Other        PGM's brother; dx 69s   Ovarian cancer Other        PGM's mother; dx unknown age   Esophageal cancer Neg Hx    Rectal cancer Neg Hx    Stomach cancer Neg Hx    Colon polyps Neg Hx     Past Surgical History:   Procedure Laterality Date   AMPUTATION Left 11/04/2022   Procedure: LEFT ABOVE KNEE AMPUTATION;  Surgeon: Nadara Mustard, MD;  Location: [facility] OR;  Service: Orthopedics;  Laterality: Left;   ANKLE FRACTURE SURGERY Left  2018   APPLICATION OF WOUND VAC Left 06/15/2022   Procedure: APPLICATION OF WOUND VAC;  Surgeon: Peggye Form, DO;  Location: [facility];  Service: Plastics;  Laterality: Left;   APPLICATION OF WOUND VAC Left 09/14/2022   Procedure: APPLICATION OF WOUND VAC;  Surgeon: Peggye Form, DO;  Location: [facility] OR;  Service: Plastics;  Laterality: Left;   BREAST LUMPECTOMY Left 07/2020   BREAST LUMPECTOMY WITH RADIOACTIVE SEED AND SENTINEL LYMPH NODE BIOPSY Left 07/26/2020   Procedure: LEFT BREAST LUMPECTOMY WITH RADIOACTIVE SEED AND SENTINEL LYMPH NODE BIOPSY;  Surgeon: Abigail Miyamoto, MD;  Location: [facility];  Service: General;  Laterality: Left;   CESAREAN SECTION  1986   COLONOSCOPY     DIAGNOSTIC LAPAROSCOPY  1985   HARDWARE REMOVAL Left 04/30/2022   Procedure: HARDWARE REMOVAL LEFT FEMUR;  Surgeon: Samson Frederic, MD;  Location: [facility];  Service: Orthopedics;  Laterality: Left;  150   HARDWARE REMOVAL Left 11/04/2022   Procedure: HARDWARE REMOVAL INFECTED LEFT TOTAL KNEE ARTHROPLASTY;  Surgeon: Nadara Mustard, MD;  Location: [facility] OR;  Service: Orthopedics;  Laterality: Left;   I & D KNEE WITH POLY EXCHANGE Left 06/07/2022   Procedure: IRRIGATION AND DEBRIDEMENT KNEE WITH POLY EXCHANGE;  Surgeon: Samson Frederic, MD;  Location: [facility];  Service: Orthopedics;  Laterality: Left;   INCISION AND DRAINAGE OF WOUND Left 06/04/2022   Procedure: IRRIGATION AND DEBRIDEMENT KNEE;  Surgeon: Samson Frederic, MD;  Location: [facility];  Service: Orthopedics;  Laterality: Left;  60   INCISION AND DRAINAGE OF WOUND Left 06/15/2022   Procedure: excision of knee wound with Myriad;  Surgeon: Peggye Form, DO;  Location: [facility];  Service: Plastics;  Laterality: Left;    ORIF FEMUR FRACTURE Left 04/10/2021   Procedure: OPEN REDUCTION INTERNAL FIXATION (ORIF) DISTAL FEMUR FRACTURE;  Surgeon: Myrene Galas, MD;  Location: [facility] OR;  Service: Orthopedics;  Laterality: Left;   PARTIAL KNEE ARTHROPLASTY Left 04/30/2022   Procedure: DISTAL FEMUR REPLACEMENT;  Surgeon: Samson Frederic, MD;  Location: [facility];  Service: Orthopedics;  Laterality: Left;  150   Social History   Occupational History   Occupation: diabled  Tobacco Use   Smoking status: Former    Current packs/day: 1.50    Average packs/day: 1.5 packs/day for 40.0 years (60.0 ttl pk-yrs)    Types: Cigarettes   Smokeless tobacco: Never   Tobacco comments:    has used e-cigarettes, no vaping   Vaping Use   Vaping status: Never Used  Substance and Sexual Activity   Alcohol use: Not Currently   Drug use: No   Sexual activity: Not Currently

## 2023-01-13 ENCOUNTER — Ambulatory Visit: Payer: Medicare Other | Admitting: Family

## 2023-01-13 ENCOUNTER — Ambulatory Visit: Payer: Medicare Other

## 2023-01-19 ENCOUNTER — Ambulatory Visit: Payer: Medicare Other | Admitting: Internal Medicine

## 2023-01-23 IMAGING — MG MM BREAST SURGICAL SPECIMEN
1 series · 2 of 2 positions shown · non-contrast
Comparison: Previous exam(s).

CLINICAL DATA: Status post seed localized LEFT lumpectomy.

EXAM:
SPECIMEN RADIOGRAPH OF THE LEFT BREAST

[Series 2: L · left · 0.07mm/px · 2 of 2 slices shown]
[im 1/2]
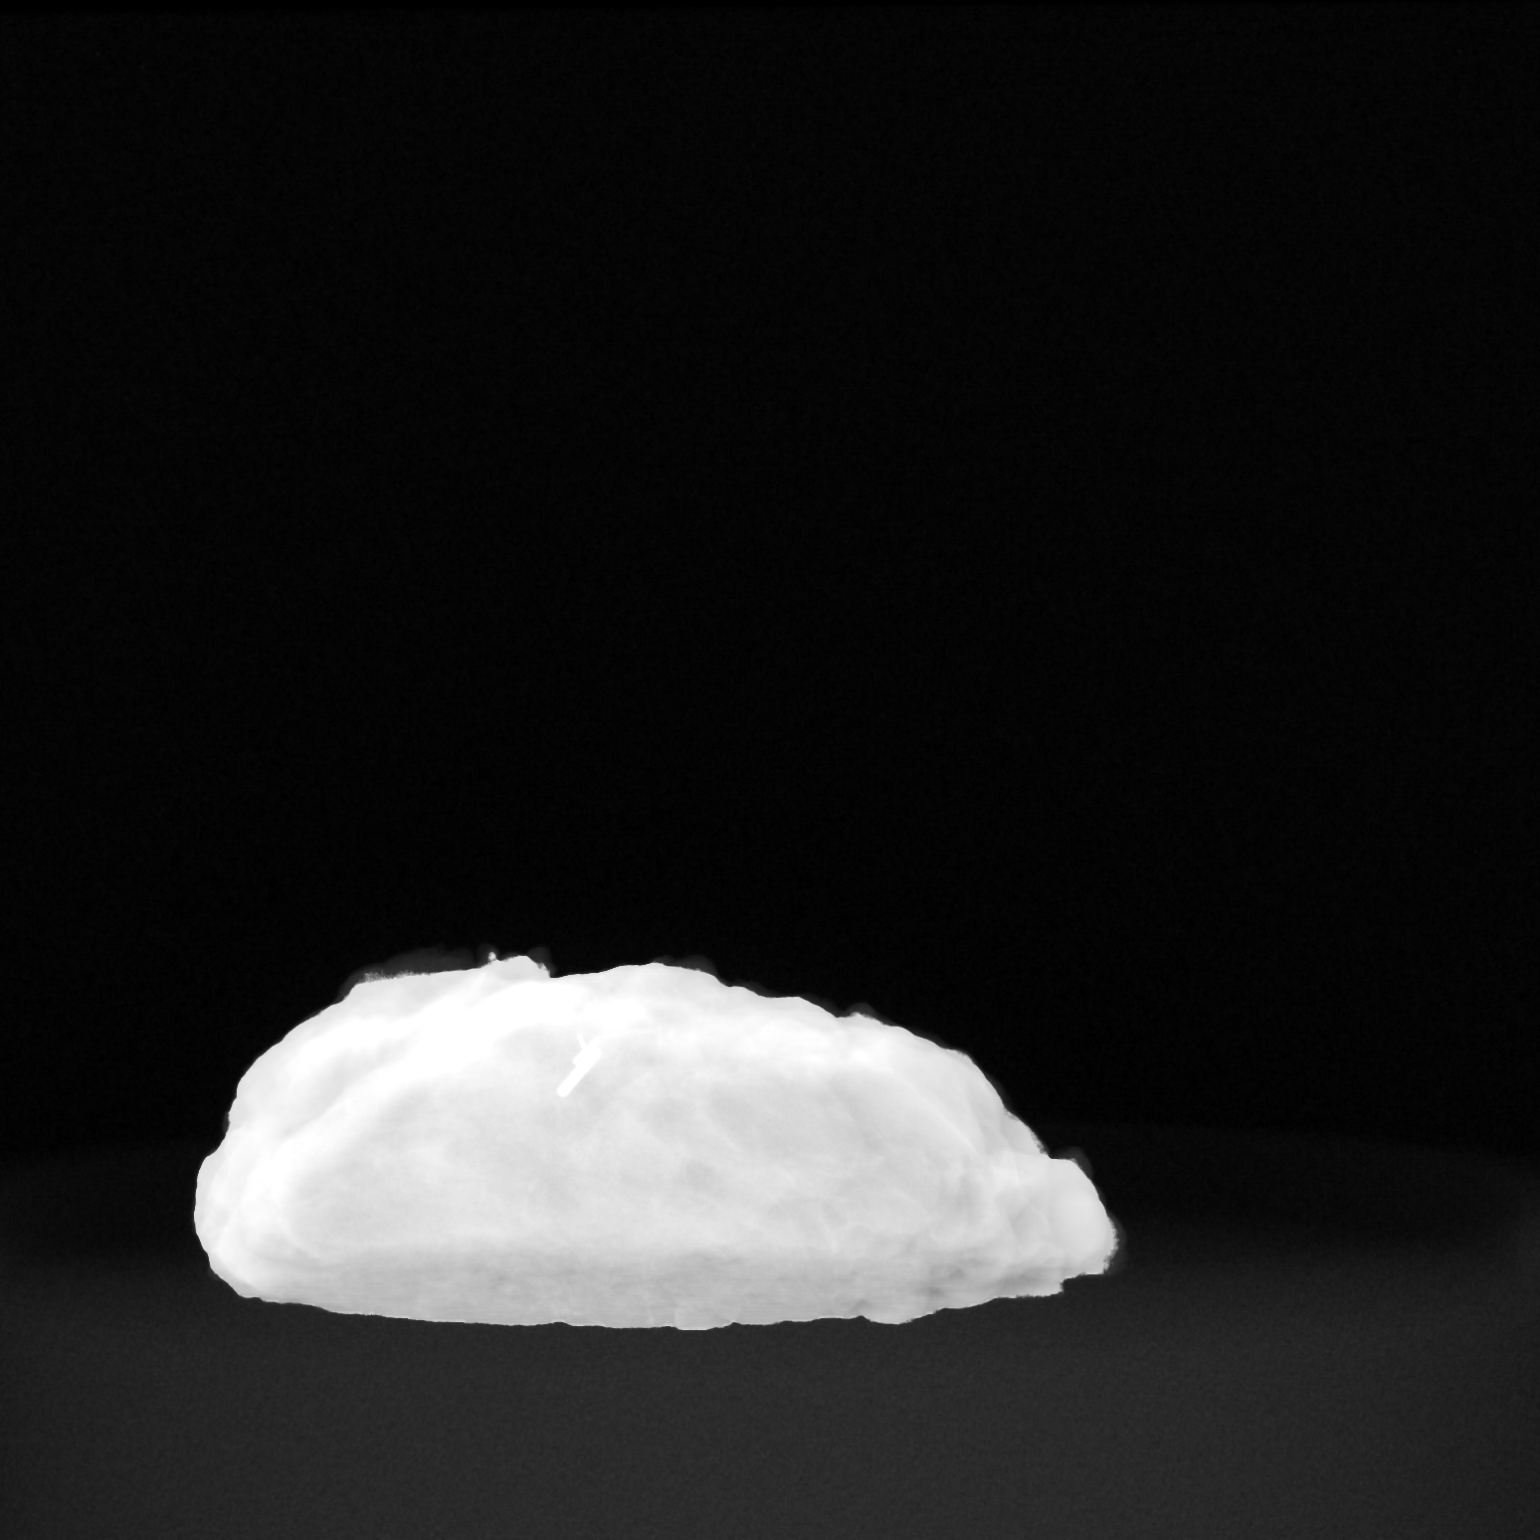
[im 2/2]
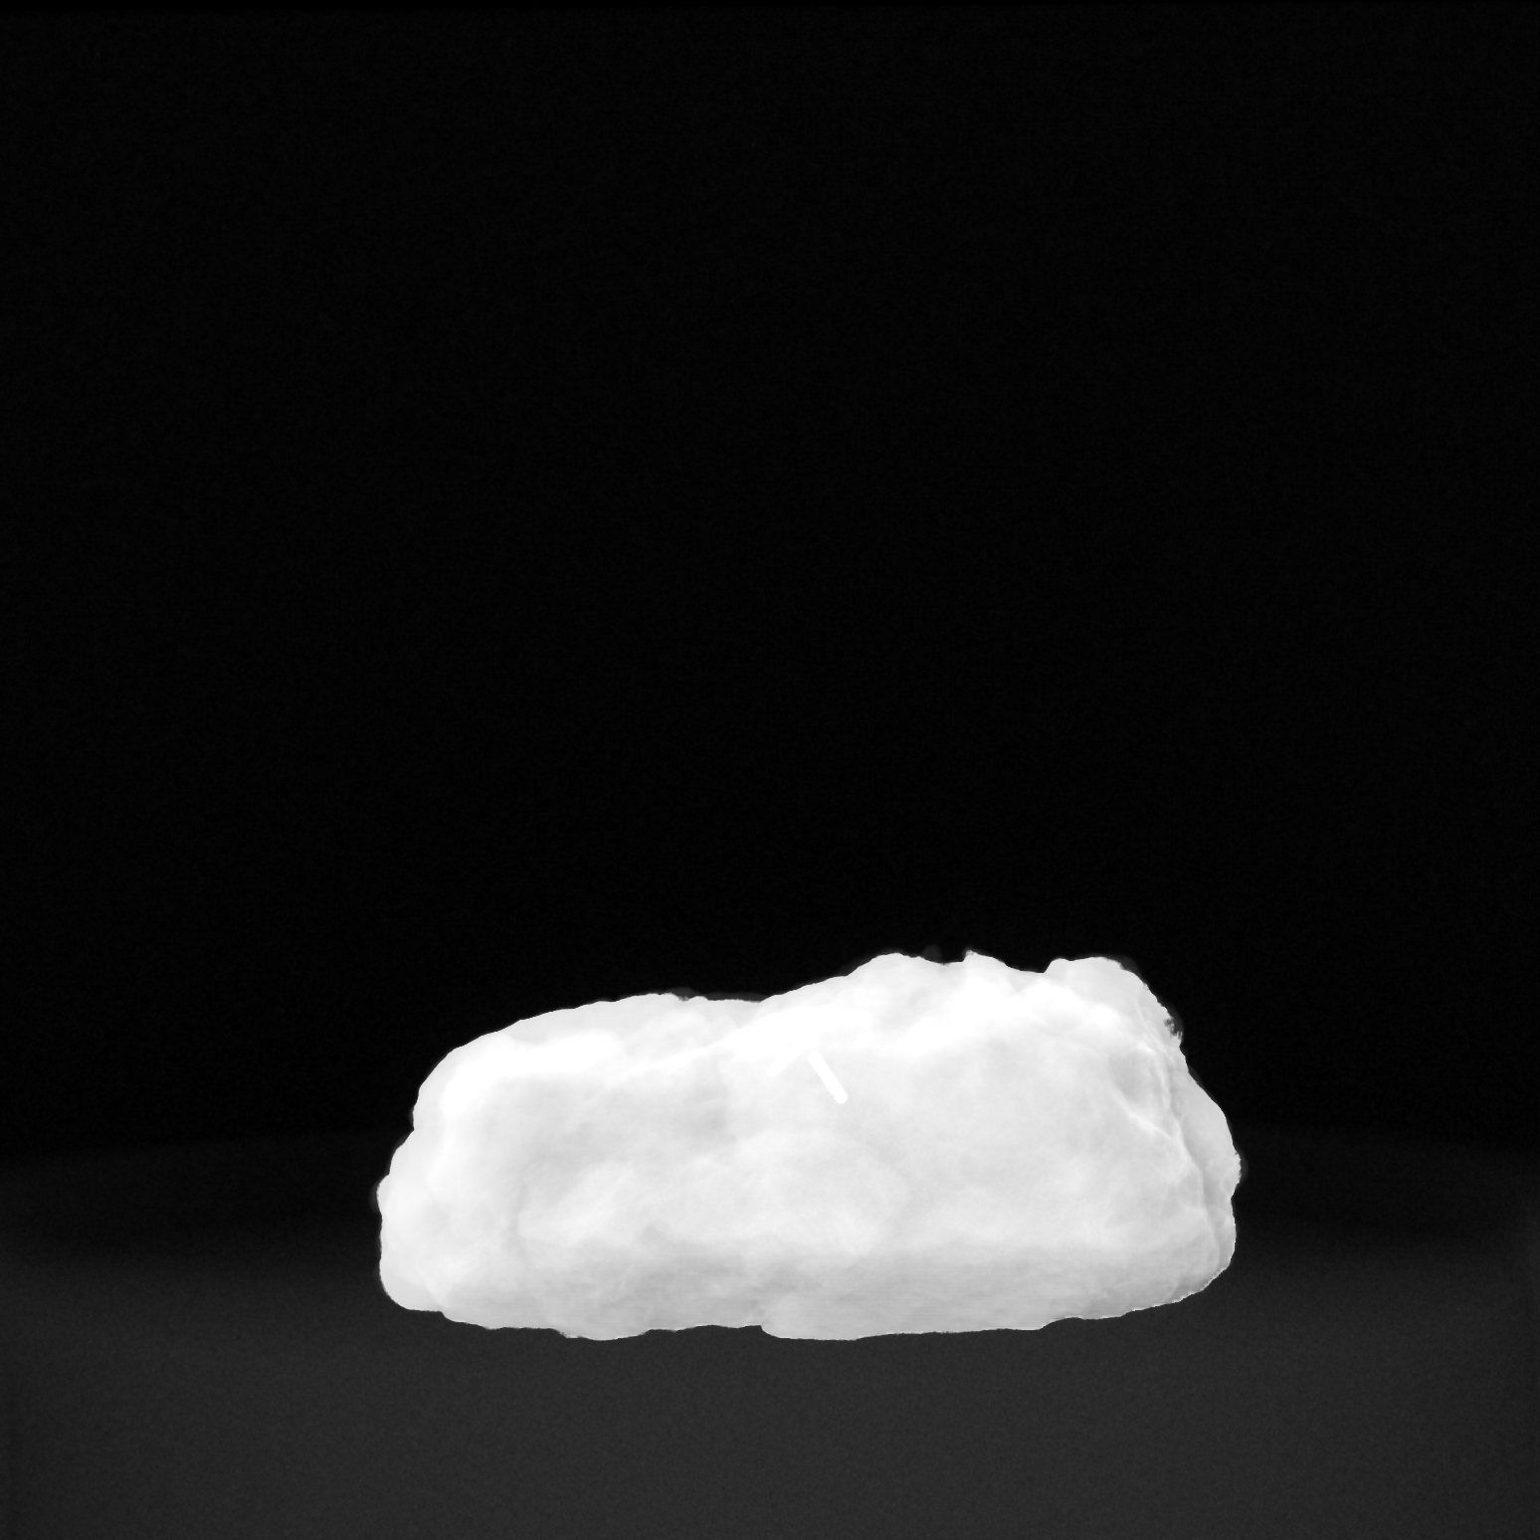

[2 of 2 positions shown; findings below may reference images not displayed]

FINDINGS: Status post excision of the left breast. The radioactive seed and
ribbon shaped biopsy marker clip are present and intact. The
findings are discussed with the operating room nurse at the time of
interpretation.
IMPRESSION: Specimen radiograph of the left breast.

## 2023-01-25 ENCOUNTER — Ambulatory Visit (INDEPENDENT_AMBULATORY_CARE_PROVIDER_SITE_OTHER): Payer: Medicare Other | Admitting: Physical Therapy

## 2023-01-25 ENCOUNTER — Other Ambulatory Visit: Payer: Self-pay

## 2023-01-25 ENCOUNTER — Encounter: Payer: Self-pay | Admitting: Physical Therapy

## 2023-01-25 DIAGNOSIS — M25561 Pain in right knee: Secondary | ICD-10-CM | POA: Diagnosis not present

## 2023-01-25 DIAGNOSIS — R2689 Other abnormalities of gait and mobility: Secondary | ICD-10-CM | POA: Diagnosis not present

## 2023-01-25 DIAGNOSIS — R2681 Unsteadiness on feet: Secondary | ICD-10-CM

## 2023-01-25 DIAGNOSIS — M6281 Muscle weakness (generalized): Secondary | ICD-10-CM

## 2023-01-25 DIAGNOSIS — Z9181 History of falling: Secondary | ICD-10-CM

## 2023-01-25 DIAGNOSIS — G8929 Other chronic pain: Secondary | ICD-10-CM

## 2023-01-25 NOTE — Therapy (Signed)
OUTPATIENT PHYSICAL THERAPY PROSTHETIC EVALUATION   Patient Name: Tammy [name] MRN: [id number] DOB:1955-06-23, 67 y.o., female Today's Date: 01/25/2023  END OF SESSION:  PT End of Session - 01/25/23 1058     Visit Number 1    Number of Visits 27    Date for PT Re-Evaluation 04/22/23    Authorization Type [employer]    Progress Note Due on Visit 10    PT Start Time 1036    PT Stop Time 1100    PT Time Calculation (min) 24 min    Equipment Utilized During Treatment Gait belt    Activity Tolerance Patient tolerated treatment well;Patient limited by fatigue    Behavior During Therapy WFL for tasks assessed/performed             Past Medical History:  Diagnosis Date   Anemia    hx of   Anxiety    on meds   Arthritis    back/bilateral ankles   Bipolar disorder (HCC)    Breast cancer (HCC) 2022   LEFT breast-radiation   Depression    on meds   Family history of breast cancer 07/10/2020   Family history of colon cancer 07/10/2020   Family history of ovarian cancer 07/10/2020   GERD (gastroesophageal reflux disease)    on meds   Heart murmur    dx by PCP   Hepatitis C    HTN (hypertension)    on meds   Hx of radiation therapy    Liver cirrhosis (HCC)    from hep c   Neuromuscular disorder (HCC)    Osteopenia    Personal history of radiation therapy    Stroke (HCC) 09/2012   slurred speech and LEFT sided weakness/notes 10/04/2012   Past Surgical History:  Procedure Laterality Date   AMPUTATION Left 11/04/2022   Procedure: LEFT ABOVE KNEE AMPUTATION;  Surgeon: Nadara Mustard, MD;  Location: [facility] OR;  Service: Orthopedics;  Laterality: Left;   ANKLE FRACTURE SURGERY Left 2018   APPLICATION OF WOUND VAC Left 06/15/2022   Procedure: APPLICATION OF WOUND VAC;  Surgeon: Peggye Form, DO;  Location: [facility] ORS;  Service: Plastics;  Laterality: Left;   APPLICATION OF WOUND VAC Left 09/14/2022   Procedure: APPLICATION OF WOUND VAC;  Surgeon:  Peggye Form, DO;  Location: [facility] OR;  Service: Plastics;  Laterality: Left;   BREAST LUMPECTOMY Left 07/2020   BREAST LUMPECTOMY WITH RADIOACTIVE SEED AND SENTINEL LYMPH NODE BIOPSY Left 07/26/2020   Procedure: LEFT BREAST LUMPECTOMY WITH RADIOACTIVE SEED AND SENTINEL LYMPH NODE BIOPSY;  Surgeon: Abigail Miyamoto, MD;  Location: [facility];  Service: General;  Laterality: Left;   CESAREAN SECTION  1986   COLONOSCOPY     DIAGNOSTIC LAPAROSCOPY  1985   HARDWARE REMOVAL Left 04/30/2022   Procedure: HARDWARE REMOVAL LEFT FEMUR;  Surgeon: Samson Frederic, MD;  Location: [facility];  Service: Orthopedics;  Laterality: Left;  150   HARDWARE REMOVAL Left 11/04/2022   Procedure: HARDWARE REMOVAL INFECTED LEFT TOTAL KNEE ARTHROPLASTY;  Surgeon: Nadara Mustard, MD;  Location: [facility] OR;  Service: Orthopedics;  Laterality: Left;   I & D KNEE WITH POLY EXCHANGE Left 06/07/2022   Procedure: IRRIGATION AND DEBRIDEMENT KNEE WITH POLY EXCHANGE;  Surgeon: Samson Frederic, MD;  Location: [facility];  Service: Orthopedics;  Laterality: Left;   INCISION AND DRAINAGE OF WOUND Left 06/04/2022   Procedure: IRRIGATION AND DEBRIDEMENT KNEE;  Surgeon: Samson Frederic, MD;  Location: [facility];  Service: Orthopedics;  Laterality: Left;  60   INCISION AND DRAINAGE OF WOUND Left 06/15/2022   Procedure: excision of knee wound with Myriad;  Surgeon: Peggye Form, DO;  Location: [facility];  Service: Plastics;  Laterality: Left;   ORIF FEMUR FRACTURE Left 04/10/2021   Procedure: OPEN REDUCTION INTERNAL FIXATION (ORIF) DISTAL FEMUR FRACTURE;  Surgeon: Myrene Galas, MD;  Location: [facility] OR;  Service: Orthopedics;  Laterality: Left;   PARTIAL KNEE ARTHROPLASTY Left 04/30/2022   Procedure: DISTAL FEMUR REPLACEMENT;  Surgeon: Samson Frederic, MD;  Location: [facility];  Service: Orthopedics;  Laterality: Left;  150   Patient Active Problem List   Diagnosis Date Noted   Bipolar 1 disorder, depressed, partial remission (HCC)  11/18/2022   Left above-knee amputee (HCC) 11/13/2022   Infection of total knee replacement (HCC) 11/04/2022   S/P AKA (above knee amputation) unilateral, left (HCC) 11/04/2022   Chronic hepatitis C with cirrhosis (HCC) 06/06/2022   Surgical wound dehiscence, initial encounter 06/04/2022   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, left, with nonunion, subsequent encounter 04/30/2022   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 04/30/2022   Hypokalemia 04/16/2021   Hip fracture requiring operative repair (HCC) 04/16/2021   Vitamin D deficiency 04/12/2021   Hyponatremia 04/11/2021   Acute postoperative anemia due to expected blood loss 04/11/2021   Class 1 obesity 04/11/2021   Left comminuted and displaced fracture of the distal femur metaphysis 04/08/2021   History of CVA (cerebrovascular accident) 04/08/2021   Genetic testing 07/17/2020   Family history of breast cancer 07/10/2020   Family history of ovarian cancer 07/10/2020   Family history of colon cancer 07/10/2020   Malignant neoplasm of upper-outer quadrant of left breast in female, estrogen receptor positive (HCC) 07/09/2020   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   Chronic pain syndrome 10/06/2012   Nicotine dependence 10/06/2012   Anxiety state 10/05/2012   Bipolar disorder, unspecified (HCC) 10/05/2012   Slurred speech 10/04/2012   Left-sided weakness 10/04/2012   Hyperkalemia 10/04/2012   Acute CVA (cerebrovascular accident) (HCC) 10/04/2012   Hypertension     PCP: Quitman Livings, MD  REFERRING PROVIDER: Aldean Baker, MD  ONSET DATE: 01/25/2023 prosthesis delivery  REFERRING DIAG: F62.130 (ICD-10-CM) - Hx of AKA (above knee amputation), left   THERAPY DIAG:  Other abnormalities of gait and mobility  Unsteadiness on feet  Muscle weakness (generalized)  Chronic pain of right knee  History of falling  Rationale for Evaluation and Treatment:  Rehabilitation  SUBJECTIVE:   SUBJECTIVE STATEMENT: This 67yo female fractured her left femur with ORIF 04/10/2021 which became infected.  She underwent a left Transfemoral Amputation 11/04/2022.  She received her prosthesis today, 01/25/2023, 1 hour prior to PT evaluation.  Pt accompanied by: significant other  PERTINENT HISTORY: anemia, anxiety, arthritis, Bipolar disorder, Breast CA left with radiation, HTN, Hep C, liver cirrhosis, Neuromuscular disorder, osteopenia, stroke Left hemiparesis 2014  PAIN:  Are you having pain? Yes: NPRS scale: ranges from 0/10 - 5/10 Pain location: right knee in joint Pain description: throbbing Aggravating factors: standing or walking too much, or twisting motion Relieving factors: meds or medicated lotions  PRECAUTIONS: Fall  WEIGHT BEARING RESTRICTIONS: No  FALLS: Has patient fallen in last 6 months? Yes. Number of falls 1 no injuries  LIVING ENVIRONMENT: Lives with: lives with their spouse Lives in: Mobile home Home Access: Ramped entrance or 4 steps with double rail to [employer] layout: One level Has following equipment at home:  Single point cane, Walker - 2 wheeled, Wheelchair (manual), Shower bench, bed side commode, Grab bars, and Ramped entry  OCCUPATION: retired from nursing  PLOF: Independent prior to Fracture 04/10/2021.  Since fracture was able to get to cane for community until hardware broke.    PATIENT GOALS:  walk independently in community, travel, drive, take care of 4yo great granddaughter  OBJECTIVE:  COGNITION: Overall cognitive status: Within functional limits for tasks assessed  POSTURE: rounded shoulders, forward head, flexed trunk , and weight shift right  LOWER EXTREMITY ROM:  ROM P:passive  A:active Right eval Left eval  Hip flexion    Hip extension    Hip abduction    Hip adduction    Hip internal rotation    Hip external rotation    Knee flexion    Knee extension    Ankle dorsiflexion    Ankle  plantarflexion    Ankle inversion    Ankle eversion     (Blank rows = not tested)  LOWER EXTREMITY MMT:  MMT Right eval Left eval  Hip flexion    Hip extension    Hip abduction    Hip adduction    Hip internal rotation    Hip external rotation    Knee flexion    Knee extension    Ankle dorsiflexion    Ankle plantarflexion    Ankle inversion    Ankle eversion    At Evaluation all strength testing is grossly seated and functionally standing / gait. (Blank rows = not tested)  TRANSFERS: Evaluation on 01/25/2023:  Sit to stand: SBA w/c to RW requires armrests on w/c and stabilization with RW.   Stand to sit: SBA RW to w/c requires armrests on w/c and stabilization with RW.    FUNCTIONAL TESTs:  Sharlene Motts Balance Scale: 15/56  [facility] PT Assessment - 01/25/23 1040       Standardized Balance Assessment   Standardized Balance Assessment Berg Balance Test      Berg Balance Test   Sit to Stand Needs minimal aid to stand or to stabilize    Standing Unsupported Able to stand 30 seconds unsupported    Sitting with Back Unsupported but Feet Supported on Floor or Stool Able to sit safely and securely 2 minutes    Stand to Sit Controls descent by using hands    Transfers Able to transfer safely, definite need of hands    Standing Unsupported with Eyes Closed Needs help to keep from falling    Standing Unsupported with Feet Together Needs help to attain position and unable to hold for 15 seconds    From Standing, Reach Forward with Outstretched Arm Reaches forward but needs supervision    From Standing Position, Pick up Object from Floor Unable to try/needs assist to keep balance    From Standing Position, Turn to Look Behind Over each Shoulder Needs supervision when turning    Turn 360 Degrees Needs assistance while turning    Standing Unsupported, Alternately Place Feet on Step/Stool Needs assistance to keep from falling or unable to try    Standing Unsupported, One Foot in Front Loses  balance while stepping or standing    Standing on One Leg Unable to try or needs assist to prevent fall    Total Score 15    Berg comment: Group Information    BERG  < 36 high risk for falls (close to 100%) 46-51 moderate (>50%)   37-45 significant (>80%) 52-55 lower (> 25%)  GAIT: Evaluation on 01/25/2023: Deferred to visit 2 due to limited time due to late start.   CURRENT PROSTHETIC WEAR ASSESSMENT: Evaluation on 01/25/2023:  Patient is dependent with: skin check, residual limb care, care of non-amputated limb, prosthetic cleaning, ply sock cleaning, correct ply sock adjustment, proper wear schedule/adjustment, and proper weight-bearing schedule/adjustment Donning prosthesis: Min A Doffing prosthesis: SBA Prosthetic wear tolerance: tolerated wear 1 hour prior to PT evaluation.  Prosthetic weight bearing tolerance: 5 minutes with BUE support on RW with limited wear on prosthesis.  Edema: pitting Prosthetic description: Ischial Containment socket with flexible inner socket, silicon liner with velcro strap suspension.     TODAY'S TREATMENT:                                                                                                                             DATE:  01/25/2023: Evaluation only  HOME EXERCISE PROGRAM:  ASSESSMENT:  CLINICAL IMPRESSION: Patient is a 67 y.o. female who was seen today for physical therapy evaluation and treatment for prosthetic training with left Transfemoral Amputation. Patient is dependent with prosthetic care.  She received her prosthesis just prior to PT evaluation and with only 1 hour of wear.  Berg balance 15/56 indicates high fall risk.  Patient is dependent in gait with prosthesis. Patient would benefit from skilled PT to improve function and safety with her prosthesis.   OBJECTIVE IMPAIRMENTS: Abnormal gait, decreased activity tolerance, decreased balance, decreased endurance, decreased knowledge of condition, decreased  knowledge of use of DME, decreased mobility, difficulty walking, decreased ROM, decreased strength, increased edema, impaired flexibility, postural dysfunction, prosthetic dependency , obesity, and pain.   ACTIVITY LIMITATIONS: carrying, lifting, bending, sitting, standing, stairs, transfers, and locomotion level  PARTICIPATION LIMITATIONS: meal prep, cleaning, and community activity  PERSONAL FACTORS: Fitness, Time since onset of injury/illness/exacerbation, and 3+ comorbidities: see PMH  are also affecting patient's functional outcome.   REHAB POTENTIAL: Good  CLINICAL DECISION MAKING: Evolving/moderate complexity  EVALUATION COMPLEXITY: Moderate   GOALS: Goals reviewed with patient? Yes  SHORT TERM GOALS: Target date: 02/25/2023  Patient donnes prosthesis modified independent & verbalizes proper cleaning. Baseline: SEE OBJECTIVE DATA Goal status: INITIAL 2.  Patient tolerates prosthesis >10 hrs total /day without skin issues or limb pain after standing. Baseline: SEE OBJECTIVE DATA Goal status: INITIAL  3.  Berg Balance >20/56 Baseline: SEE OBJECTIVE DATA Goal status: INITIAL  4. Patient ambulates 51' with RW & prosthesis with supervision. Baseline: SEE OBJECTIVE DATA Goal status: INITIAL  5. Patient negotiates ramps & curbs with RW & prosthesis with minA. Baseline: SEE OBJECTIVE DATA Goal status: INITIAL  LONG TERM GOALS: Target date: 04/22/2023  Patient demonstrates & verbalized understanding of prosthetic care to enable safe utilization of prosthesis. Baseline: SEE OBJECTIVE DATA Goal status: INITIAL  Patient tolerates prosthesis wear >90% of awake hours without skin or residual limb pain issues. Baseline: SEE OBJECTIVE DATA Goal status: INITIAL  Berg Balance >35/56 to indicate lower fall risk Baseline:  SEE OBJECTIVE DATA Goal status: INITIAL  Patient ambulates >300' with prosthesis and cane or less modified independent. Baseline: SEE OBJECTIVE DATA Goal  status: INITIAL  Patient negotiates ramps, curbs & stairs with single rail with prosthesis  and cane or less modified independent. Baseline: SEE OBJECTIVE DATA Goal status: INITIAL   Patient reports right knee pain </= 2/10 with standing and gait activities.   Baseline: SEE OBJECTIVE DATA Goal status: INITIAL  PLAN:  PT FREQUENCY:  3 x for 1 week, then 2x/week  PT DURATION: 90 days / 13 weeks  PLANNED INTERVENTIONS: 97164- PT Re-evaluation, 97110-Therapeutic exercises, 97530- Therapeutic activity, [id number]- Neuromuscular re-education, [phone]- Self Care, [id number]- Gait training, [phone]- Prosthetic training, Patient/Family education, Balance training, Stair training, DME instructions, and physical performance testing  PLAN FOR NEXT SESSION: assess gait. Instruct in initial prosthetic care including donning.  Instruct in sit to/from stand.   Date of referral: 12/22/2022 Referring provider: Aldean Baker, MD Referring diagnosis? B14.782 (ICD-10-CM) - Hx of AKA (above knee amputation), left  Treatment diagnosis? (if different than referring diagnosis)  Other abnormalities of gait and mobility  ICD-10-CM: R26.89   Unsteadiness on feet  ICD-10-CM: R26.81   Muscle weakness (generalized)  ICD-10-CM: M62.81   Chronic pain of right knee  M25.561 & G89.29  History of falling Z91.81  What was this (referring dx) caused by? Surgery (Type: AKA)  Nature of Condition: Initial Onset (within last 3 months) for prosthesis delivery   Laterality: Lt  Current Functional Measure Score: Other Berg 15/56  Objective measurements identify impairments when they are compared to normal values, the uninvolved extremity, and prior level of function.  [x]  Yes  []  No  Objective assessment of functional ability: Moderate functional limitations   Briefly describe symptoms: patient received prosthesis 01/25/2023 and is dependent in use, care and functional activities.  How did symptoms start: amputation due to  infected hardware with ORIF.    Average pain intensity:  Last 24 hours: 3  Past week: 3  How often does the pt experience symptoms? Constantly  How much have the symptoms interfered with usual daily activities? Extremely  How has condition changed since care began at this facility? NA - initial visit  In general, how is the patients overall health? Very Good   BACK PAIN (STarT Back Screening Tool) No   Vladimir Faster, PT, DPT 01/25/2023, 3:02 PM

## 2023-01-26 ENCOUNTER — Encounter: Payer: Self-pay | Admitting: Physical Therapy

## 2023-01-26 ENCOUNTER — Ambulatory Visit (INDEPENDENT_AMBULATORY_CARE_PROVIDER_SITE_OTHER): Payer: Medicare Other | Admitting: Physical Therapy

## 2023-01-26 DIAGNOSIS — M6281 Muscle weakness (generalized): Secondary | ICD-10-CM

## 2023-01-26 DIAGNOSIS — R2689 Other abnormalities of gait and mobility: Secondary | ICD-10-CM

## 2023-01-26 DIAGNOSIS — R2681 Unsteadiness on feet: Secondary | ICD-10-CM

## 2023-01-26 DIAGNOSIS — M25561 Pain in right knee: Secondary | ICD-10-CM

## 2023-01-26 DIAGNOSIS — Z9181 History of falling: Secondary | ICD-10-CM

## 2023-01-26 DIAGNOSIS — G8929 Other chronic pain: Secondary | ICD-10-CM

## 2023-01-26 NOTE — Therapy (Signed)
OUTPATIENT PHYSICAL THERAPY PROSTHETIC TREATMENT   Patient Name: Tammy [name] MRN: [id number] DOB:1955/08/14, 67 y.o., female Today's Date: 01/26/2023  END OF SESSION:  PT End of Session - 01/26/23 1420     Visit Number 2    Number of Visits 27    Date for PT Re-Evaluation 04/22/23    Authorization Type [employer]    Progress Note Due on Visit 10    PT Start Time 1321    PT Stop Time 1419    PT Time Calculation (min) 58 min    Equipment Utilized During Treatment Gait belt    Activity Tolerance Patient tolerated treatment well;Patient limited by fatigue    Behavior During Therapy WFL for tasks assessed/performed              Past Medical History:  Diagnosis Date   Anemia    hx of   Anxiety    on meds   Arthritis    back/bilateral ankles   Bipolar disorder (HCC)    Breast cancer (HCC) 2022   LEFT breast-radiation   Depression    on meds   Family history of breast cancer 07/10/2020   Family history of colon cancer 07/10/2020   Family history of ovarian cancer 07/10/2020   GERD (gastroesophageal reflux disease)    on meds   Heart murmur    dx by PCP   Hepatitis C    HTN (hypertension)    on meds   Hx of radiation therapy    Liver cirrhosis (HCC)    from hep c   Neuromuscular disorder (HCC)    Osteopenia    Personal history of radiation therapy    Stroke (HCC) 09/2012   slurred speech and LEFT sided weakness/notes 10/04/2012   Past Surgical History:  Procedure Laterality Date   AMPUTATION Left 11/04/2022   Procedure: LEFT ABOVE KNEE AMPUTATION;  Surgeon: Nadara Mustard, MD;  Location: [facility] OR;  Service: Orthopedics;  Laterality: Left;   ANKLE FRACTURE SURGERY Left 2018   APPLICATION OF WOUND VAC Left 06/15/2022   Procedure: APPLICATION OF WOUND VAC;  Surgeon: Peggye Form, DO;  Location: [facility];  Service: Plastics;  Laterality: Left;   APPLICATION OF WOUND VAC Left 09/14/2022   Procedure: APPLICATION OF WOUND VAC;  Surgeon:  Peggye Form, DO;  Location: [facility] OR;  Service: Plastics;  Laterality: Left;   BREAST LUMPECTOMY Left 07/2020   BREAST LUMPECTOMY WITH RADIOACTIVE SEED AND SENTINEL LYMPH NODE BIOPSY Left 07/26/2020   Procedure: LEFT BREAST LUMPECTOMY WITH RADIOACTIVE SEED AND SENTINEL LYMPH NODE BIOPSY;  Surgeon: Abigail Miyamoto, MD;  Location: [facility];  Service: General;  Laterality: Left;   CESAREAN SECTION  1986   COLONOSCOPY     DIAGNOSTIC LAPAROSCOPY  1985   HARDWARE REMOVAL Left 04/30/2022   Procedure: HARDWARE REMOVAL LEFT FEMUR;  Surgeon: Samson Frederic, MD;  Location: [facility];  Service: Orthopedics;  Laterality: Left;  150   HARDWARE REMOVAL Left 11/04/2022   Procedure: HARDWARE REMOVAL INFECTED LEFT TOTAL KNEE ARTHROPLASTY;  Surgeon: Nadara Mustard, MD;  Location: [facility] OR;  Service: Orthopedics;  Laterality: Left;   I & D KNEE WITH POLY EXCHANGE Left 06/07/2022   Procedure: IRRIGATION AND DEBRIDEMENT KNEE WITH POLY EXCHANGE;  Surgeon: Samson Frederic, MD;  Location: [facility];  Service: Orthopedics;  Laterality: Left;   INCISION AND DRAINAGE OF WOUND Left 06/04/2022   Procedure: IRRIGATION AND DEBRIDEMENT KNEE;  Surgeon: Samson Frederic, MD;  Location: [facility];  Service: Orthopedics;  Laterality: Left;  60   INCISION AND DRAINAGE OF WOUND Left 06/15/2022   Procedure: excision of knee wound with Myriad;  Surgeon: Peggye Form, DO;  Location: [facility];  Service: Plastics;  Laterality: Left;   ORIF FEMUR FRACTURE Left 04/10/2021   Procedure: OPEN REDUCTION INTERNAL FIXATION (ORIF) DISTAL FEMUR FRACTURE;  Surgeon: Myrene Galas, MD;  Location: [facility] OR;  Service: Orthopedics;  Laterality: Left;   PARTIAL KNEE ARTHROPLASTY Left 04/30/2022   Procedure: DISTAL FEMUR REPLACEMENT;  Surgeon: Samson Frederic, MD;  Location: [facility];  Service: Orthopedics;  Laterality: Left;  150   Patient Active Problem List   Diagnosis Date Noted   Bipolar 1 disorder, depressed, partial remission (HCC)  11/18/2022   Left above-knee amputee (HCC) 11/13/2022   Infection of total knee replacement (HCC) 11/04/2022   S/P AKA (above knee amputation) unilateral, left (HCC) 11/04/2022   Chronic hepatitis C with cirrhosis (HCC) 06/06/2022   Surgical wound dehiscence, initial encounter 06/04/2022   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, left, with nonunion, subsequent encounter 04/30/2022   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 04/30/2022   Hypokalemia 04/16/2021   Hip fracture requiring operative repair (HCC) 04/16/2021   Vitamin D deficiency 04/12/2021   Hyponatremia 04/11/2021   Acute postoperative anemia due to expected blood loss 04/11/2021   Class 1 obesity 04/11/2021   Left comminuted and displaced fracture of the distal femur metaphysis 04/08/2021   History of CVA (cerebrovascular accident) 04/08/2021   Genetic testing 07/17/2020   Family history of breast cancer 07/10/2020   Family history of ovarian cancer 07/10/2020   Family history of colon cancer 07/10/2020   Malignant neoplasm of upper-outer quadrant of left breast in female, estrogen receptor positive (HCC) 07/09/2020   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   Chronic pain syndrome 10/06/2012   Nicotine dependence 10/06/2012   Anxiety state 10/05/2012   Bipolar disorder, unspecified (HCC) 10/05/2012   Slurred speech 10/04/2012   Left-sided weakness 10/04/2012   Hyperkalemia 10/04/2012   Acute CVA (cerebrovascular accident) (HCC) 10/04/2012   Hypertension     PCP: Quitman Livings, MD  REFERRING PROVIDER: Aldean Baker, MD  ONSET DATE: 01/25/2023 prosthesis delivery  REFERRING DIAG: Z61.096 (ICD-10-CM) - Hx of AKA (above knee amputation), left   THERAPY DIAG:  Other abnormalities of gait and mobility  Unsteadiness on feet  Muscle weakness (generalized)  Chronic pain of right knee  History of falling  Rationale for Evaluation and Treatment:  Rehabilitation  SUBJECTIVE:   SUBJECTIVE STATEMENT: Patient wore prosthesis 4 hours yesterday and found blisters on limb when she removed the prosthesis.    Pt accompanied by: significant other  PERTINENT HISTORY: anemia, anxiety, arthritis, Bipolar disorder, Breast CA left with radiation, HTN, Hep C, liver cirrhosis, Neuromuscular disorder, osteopenia, stroke Left hemiparesis 2014  PAIN:  Are you having pain? Yes: NPRS scale: ranges from 0/10 - 5/10 Pain location: right knee in joint Pain description: throbbing Aggravating factors: standing or walking too much, or twisting motion Relieving factors: meds or medicated lotions  PRECAUTIONS: Fall  WEIGHT BEARING RESTRICTIONS: No  FALLS: Has patient fallen in last 6 months? Yes. Number of falls 1 no injuries  LIVING ENVIRONMENT: Lives with: lives with their spouse Lives in: Mobile home Home Access: Ramped entrance or 4 steps with double rail to [employer] layout: One level Has following equipment at home: Single point cane, Environmental consultant - 2 wheeled, Wheelchair (manual), Shower bench, bed side commode, Grab bars,  and Ramped entry  OCCUPATION: retired from nursing  PLOF: Independent prior to Fracture 04/10/2021.  Since fracture was able to get to cane for community until hardware broke.    PATIENT GOALS:  walk independently in community, travel, drive, take care of 4yo great granddaughter  OBJECTIVE:  COGNITION: Overall cognitive status: Within functional limits for tasks assessed  POSTURE: rounded shoulders, forward head, flexed trunk , and weight shift right  LOWER EXTREMITY ROM:  ROM P:passive  A:active Right eval Left eval  Hip flexion    Hip extension    Hip abduction    Hip adduction    Hip internal rotation    Hip external rotation    Knee flexion    Knee extension    Ankle dorsiflexion    Ankle plantarflexion    Ankle inversion    Ankle eversion     (Blank rows = not tested)  LOWER EXTREMITY MMT:  MMT  Right eval Left eval  Hip flexion    Hip extension    Hip abduction    Hip adduction    Hip internal rotation    Hip external rotation    Knee flexion    Knee extension    Ankle dorsiflexion    Ankle plantarflexion    Ankle inversion    Ankle eversion    At Evaluation all strength testing is grossly seated and functionally standing / gait. (Blank rows = not tested)  TRANSFERS: Evaluation on 01/25/2023:  Sit to stand: SBA w/c to RW requires armrests on w/c and stabilization with RW.   Stand to sit: SBA RW to w/c requires armrests on w/c and stabilization with RW.    FUNCTIONAL TESTs:  Sharlene Motts Balance Scale: 15/56    GAIT: 01/26/2023: Patient ambulated 10 feet with rolling walker with minA but required constant cueing for technique and safety.  CURRENT PROSTHETIC WEAR ASSESSMENT: Evaluation on 01/25/2023:  Patient is dependent with: skin check, residual limb care, care of non-amputated limb, prosthetic cleaning, ply sock cleaning, correct ply sock adjustment, proper wear schedule/adjustment, and proper weight-bearing schedule/adjustment Donning prosthesis: Min A Doffing prosthesis: SBA Prosthetic wear tolerance: tolerated wear 1 hour prior to PT evaluation.  Prosthetic weight bearing tolerance: 5 minutes with BUE support on RW with limited wear on prosthesis.  Edema: pitting Prosthetic description: Ischial Containment socket with flexible inner socket, silicon liner with velcro strap suspension.     TODAY'S TREATMENT:                                                                                                                             DATE:  01/26/2023: Prosthetic Training with Transfemoral Prosthesis: Patient has 6 small blisters 13mm-5mm distal limb around incision.  All are fluid filled with no signs of infection.  PT re-assessed at end of session with 50 min of activities with no change in blisters.  Blisters appear to be from air pocket inside the liner and wear  longer than indicated  for initial wear. PT verbal with some demo educated pt & her fiance, Tammy [name], on cleaning liner, positioning in sitting with prosthetic foot in contact with floor or support surface, using folded towel under left pelvis to level pelvis for sitting >5-10 minutes, sweating with amputation/prosthesis and use of antiperspirant on limb.  Toileting managing pants with single UE RW support, sit to/from stand and positioning to decrease chance of urine or BM getting on prosthetic liner.  PT recommending initial wear to be 1 hour 3 times per day.  Patient should remove liner and prosthesis between wears for 2 or more hours.  Pt & fiance verbalized understanding.   PT demo and verbal cues on proper donning of liner and prosthesis including tightening strap in standing.  Patient and fianc verbalized understanding. PT demo and verbal cues on sit to / from stand technique with proper prosthetic control.  Patient able to stand from 20"  wheelchair using armrest to rolling walker with supervision and verbal cues.  Patient able to sit down with supervision and verbal cues.  Patient's fianc videotaped patient performing activity with PT directions.  Patient to review this video multiple times a day to get the techniques that in her mind.  Patient verbalized understanding PT demo and verbal cues on gait with rolling walker and prosthesis.  Patient ambulated 10 feet with rolling walker with minA but required constant cueing for technique and safety.    TREATMENT:                                                                                                                             DATE:  01/25/2023: Evaluation only  HOME EXERCISE PROGRAM:  ASSESSMENT:  CLINICAL IMPRESSION: Patient got sick small blisters on the distal limb with prosthesis use first day that she received it.  This is probably due to incorrect donning with an air pocket inside the liner and wearing the prosthesis too long the  first day.  Patient improved sit to/from stand with PT instruction.  Patient improved understanding of donning which should reduce further skin issues.   OBJECTIVE IMPAIRMENTS: Abnormal gait, decreased activity tolerance, decreased balance, decreased endurance, decreased knowledge of condition, decreased knowledge of use of DME, decreased mobility, difficulty walking, decreased ROM, decreased strength, increased edema, impaired flexibility, postural dysfunction, prosthetic dependency , obesity, and pain.   ACTIVITY LIMITATIONS: carrying, lifting, bending, sitting, standing, stairs, transfers, and locomotion level  PARTICIPATION LIMITATIONS: meal prep, cleaning, and community activity  PERSONAL FACTORS: Fitness, Time since onset of injury/illness/exacerbation, and 3+ comorbidities: see PMH  are also affecting patient's functional outcome.   REHAB POTENTIAL: Good  CLINICAL DECISION MAKING: Evolving/moderate complexity  EVALUATION COMPLEXITY: Moderate   GOALS: Goals reviewed with patient? Yes  SHORT TERM GOALS: Target date: 02/25/2023  Patient donnes prosthesis modified independent & verbalizes proper cleaning. Baseline: SEE OBJECTIVE DATA Goal status: INITIAL 2.  Patient tolerates prosthesis >10 hrs total /day without skin issues or limb pain after standing.  Baseline: SEE OBJECTIVE DATA Goal status: INITIAL  3.  Berg Balance >20/56 Baseline: SEE OBJECTIVE DATA Goal status: INITIAL  4. Patient ambulates 8' with RW & prosthesis with supervision. Baseline: SEE OBJECTIVE DATA Goal status: INITIAL  5. Patient negotiates ramps & curbs with RW & prosthesis with minA. Baseline: SEE OBJECTIVE DATA Goal status: INITIAL  LONG TERM GOALS: Target date: 04/22/2023  Patient demonstrates & verbalized understanding of prosthetic care to enable safe utilization of prosthesis. Baseline: SEE OBJECTIVE DATA Goal status: INITIAL  Patient tolerates prosthesis wear >90% of awake hours without  skin or residual limb pain issues. Baseline: SEE OBJECTIVE DATA Goal status: INITIAL  Berg Balance >35/56 to indicate lower fall risk Baseline: SEE OBJECTIVE DATA Goal status: INITIAL  Patient ambulates >300' with prosthesis and cane or less modified independent. Baseline: SEE OBJECTIVE DATA Goal status: INITIAL  Patient negotiates ramps, curbs & stairs with single rail with prosthesis  and cane or less modified independent. Baseline: SEE OBJECTIVE DATA Goal status: INITIAL   Patient reports right knee pain </= 2/10 with standing and gait activities.   Baseline: SEE OBJECTIVE DATA Goal status: INITIAL  PLAN:  PT FREQUENCY:  3 x for 1 week, then 2x/week  PT DURATION: 90 days / 13 weeks  PLANNED INTERVENTIONS: 97164- PT Re-evaluation, 97110-Therapeutic exercises, 97530- Therapeutic activity, [id number]- Neuromuscular re-education, [phone]- Self Care, 21308- Gait training, [phone]- Prosthetic training, Patient/Family education, Balance training, Stair training, DME instructions, and physical performance testing  PLAN FOR NEXT SESSION: Assess skin, reviewed donning, work on sit to/from stand and gait with rolling walker.  Set up HEP at sink.  Date of referral: 12/22/2022 Referring provider: Aldean Baker, MD Referring diagnosis? O96.295 (ICD-10-CM) - Hx of AKA (above knee amputation), left  Treatment diagnosis? (if different than referring diagnosis)  Other abnormalities of gait and mobility  ICD-10-CM: R26.89   Unsteadiness on feet  ICD-10-CM: R26.81   Muscle weakness (generalized)  ICD-10-CM: M62.81   Chronic pain of right knee  M25.561 & G89.29  History of falling Z91.81  What was this (referring dx) caused by? Surgery (Type: AKA)  Nature of Condition: Initial Onset (within last 3 months) for prosthesis delivery   Laterality: Lt  Current Functional Measure Score: Other Berg 15/56  Objective measurements identify impairments when they are compared to normal values, the  uninvolved extremity, and prior level of function.  [x]  Yes  []  No  Objective assessment of functional ability: Moderate functional limitations   Briefly describe symptoms: patient received prosthesis 01/25/2023 and is dependent in use, care and functional activities.  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    Average pain intensity:  Last 24 hours: 3  Past week: 3  How often does the pt experience symptoms? Constantly  How much have the symptoms interfered with usual daily activities? Extremely  How has condition changed since care began at this facility? NA - initial visit  In general, how is the patients overall health? Very Good   BACK PAIN (STarT Back Screening Tool) No   Vladimir Faster, PT, DPT 01/26/2023, 4:08 PM

## 2023-01-27 ENCOUNTER — Telehealth: Payer: Self-pay

## 2023-01-27 NOTE — Telephone Encounter (Signed)
Attempted to call about use of gabapnetin with lyrica. Plan was to stop gabapentin and start lyrica. Mailbox full and no answer

## 2023-01-27 NOTE — Telephone Encounter (Signed)
Pharmacy wants to clarify and make sure patient should be on both gabapentin and lyrica and the reason for being on both, can be reached at [phone] any pharmacist available can assist

## 2023-01-28 ENCOUNTER — Ambulatory Visit: Payer: Medicare Other | Admitting: Physical Therapy

## 2023-01-28 ENCOUNTER — Encounter: Payer: Self-pay | Admitting: Physical Therapy

## 2023-01-28 DIAGNOSIS — R2681 Unsteadiness on feet: Secondary | ICD-10-CM | POA: Diagnosis not present

## 2023-01-28 DIAGNOSIS — M6281 Muscle weakness (generalized): Secondary | ICD-10-CM | POA: Diagnosis not present

## 2023-01-28 DIAGNOSIS — M25561 Pain in right knee: Secondary | ICD-10-CM

## 2023-01-28 DIAGNOSIS — R2689 Other abnormalities of gait and mobility: Secondary | ICD-10-CM | POA: Diagnosis not present

## 2023-01-28 DIAGNOSIS — G8929 Other chronic pain: Secondary | ICD-10-CM

## 2023-01-28 DIAGNOSIS — Z9181 History of falling: Secondary | ICD-10-CM

## 2023-01-28 NOTE — Therapy (Signed)
OUTPATIENT PHYSICAL THERAPY PROSTHETIC TREATMENT   Patient Name: Tammy [name] MRN: [id number] DOB:1955-08-07, 67 y.o., female Today's Date: 01/28/2023  END OF SESSION:  PT End of Session - 01/28/23 1057     Visit Number 3    Number of Visits 27    Date for PT Re-Evaluation 04/22/23    Authorization Type [employer]    Progress Note Due on Visit 10    PT Start Time 1100    PT Stop Time 1144    PT Time Calculation (min) 44 min    Equipment Utilized During Treatment Gait belt    Activity Tolerance Patient tolerated treatment well;Patient limited by fatigue    Behavior During Therapy WFL for tasks assessed/performed               Past Medical History:  Diagnosis Date   Anemia    hx of   Anxiety    on meds   Arthritis    back/bilateral ankles   Bipolar disorder (HCC)    Breast cancer (HCC) 2022   LEFT breast-radiation   Depression    on meds   Family history of breast cancer 07/10/2020   Family history of colon cancer 07/10/2020   Family history of ovarian cancer 07/10/2020   GERD (gastroesophageal reflux disease)    on meds   Heart murmur    dx by PCP   Hepatitis C    HTN (hypertension)    on meds   Hx of radiation therapy    Liver cirrhosis (HCC)    from hep c   Neuromuscular disorder (HCC)    Osteopenia    Personal history of radiation therapy    Stroke (HCC) 09/2012   slurred speech and LEFT sided weakness/notes 10/04/2012   Past Surgical History:  Procedure Laterality Date   AMPUTATION Left 11/04/2022   Procedure: LEFT ABOVE KNEE AMPUTATION;  Surgeon: Nadara Mustard, MD;  Location: [facility] OR;  Service: Orthopedics;  Laterality: Left;   ANKLE FRACTURE SURGERY Left 2018   APPLICATION OF WOUND VAC Left 06/15/2022   Procedure: APPLICATION OF WOUND VAC;  Surgeon: Peggye Form, DO;  Location: [facility] ORS;  Service: Plastics;  Laterality: Left;   APPLICATION OF WOUND VAC Left 09/14/2022   Procedure: APPLICATION OF WOUND VAC;  Surgeon:  Peggye Form, DO;  Location: [facility] OR;  Service: Plastics;  Laterality: Left;   BREAST LUMPECTOMY Left 07/2020   BREAST LUMPECTOMY WITH RADIOACTIVE SEED AND SENTINEL LYMPH NODE BIOPSY Left 07/26/2020   Procedure: LEFT BREAST LUMPECTOMY WITH RADIOACTIVE SEED AND SENTINEL LYMPH NODE BIOPSY;  Surgeon: Abigail Miyamoto, MD;  Location: [facility];  Service: General;  Laterality: Left;   CESAREAN SECTION  1986   COLONOSCOPY     DIAGNOSTIC LAPAROSCOPY  1985   HARDWARE REMOVAL Left 04/30/2022   Procedure: HARDWARE REMOVAL LEFT FEMUR;  Surgeon: Samson Frederic, MD;  Location: [facility];  Service: Orthopedics;  Laterality: Left;  150   HARDWARE REMOVAL Left 11/04/2022   Procedure: HARDWARE REMOVAL INFECTED LEFT TOTAL KNEE ARTHROPLASTY;  Surgeon: Nadara Mustard, MD;  Location: [facility] OR;  Service: Orthopedics;  Laterality: Left;   I & D KNEE WITH POLY EXCHANGE Left 06/07/2022   Procedure: IRRIGATION AND DEBRIDEMENT KNEE WITH POLY EXCHANGE;  Surgeon: Samson Frederic, MD;  Location: [facility];  Service: Orthopedics;  Laterality: Left;   INCISION AND DRAINAGE OF WOUND Left 06/04/2022   Procedure: IRRIGATION AND DEBRIDEMENT KNEE;  Surgeon: Samson Frederic, MD;  Location: [facility];  Service: Orthopedics;  Laterality: Left;  60   INCISION AND DRAINAGE OF WOUND Left 06/15/2022   Procedure: excision of knee wound with Myriad;  Surgeon: Peggye Form, DO;  Location: [facility];  Service: Plastics;  Laterality: Left;   ORIF FEMUR FRACTURE Left 04/10/2021   Procedure: OPEN REDUCTION INTERNAL FIXATION (ORIF) DISTAL FEMUR FRACTURE;  Surgeon: Myrene Galas, MD;  Location: [facility] OR;  Service: Orthopedics;  Laterality: Left;   PARTIAL KNEE ARTHROPLASTY Left 04/30/2022   Procedure: DISTAL FEMUR REPLACEMENT;  Surgeon: Samson Frederic, MD;  Location: [facility];  Service: Orthopedics;  Laterality: Left;  150   Patient Active Problem List   Diagnosis Date Noted   Bipolar 1 disorder, depressed, partial remission (HCC)  11/18/2022   Left above-knee amputee (HCC) 11/13/2022   Infection of total knee replacement (HCC) 11/04/2022   S/P AKA (above knee amputation) unilateral, left (HCC) 11/04/2022   Chronic hepatitis C with cirrhosis (HCC) 06/06/2022   Surgical wound dehiscence, initial encounter 06/04/2022   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, left, with nonunion, subsequent encounter 04/30/2022   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 04/30/2022   Hypokalemia 04/16/2021   Hip fracture requiring operative repair (HCC) 04/16/2021   Vitamin D deficiency 04/12/2021   Hyponatremia 04/11/2021   Acute postoperative anemia due to expected blood loss 04/11/2021   Class 1 obesity 04/11/2021   Left comminuted and displaced fracture of the distal femur metaphysis 04/08/2021   History of CVA (cerebrovascular accident) 04/08/2021   Genetic testing 07/17/2020   Family history of breast cancer 07/10/2020   Family history of ovarian cancer 07/10/2020   Family history of colon cancer 07/10/2020   Malignant neoplasm of upper-outer quadrant of left breast in female, estrogen receptor positive (HCC) 07/09/2020   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   Chronic pain syndrome 10/06/2012   Nicotine dependence 10/06/2012   Anxiety state 10/05/2012   Bipolar disorder, unspecified (HCC) 10/05/2012   Slurred speech 10/04/2012   Left-sided weakness 10/04/2012   Hyperkalemia 10/04/2012   Acute CVA (cerebrovascular accident) (HCC) 10/04/2012   Hypertension     PCP: Quitman Livings, MD  REFERRING PROVIDER: Aldean Baker, MD  ONSET DATE: 01/25/2023 prosthesis delivery  REFERRING DIAG: Z61.096 (ICD-10-CM) - Hx of AKA (above knee amputation), left   THERAPY DIAG:  Other abnormalities of gait and mobility  Unsteadiness on feet  Muscle weakness (generalized)  Chronic pain of right knee  History of falling  Rationale for Evaluation and Treatment:  Rehabilitation  SUBJECTIVE:   SUBJECTIVE STATEMENT: Patient wore prosthesis for 2 hours 2x yesterday as PT recommended with no changes in her blisters.     Pt accompanied by: significant other  PERTINENT HISTORY: anemia, anxiety, arthritis, Bipolar disorder, Breast CA left with radiation, HTN, Hep C, liver cirrhosis, Neuromuscular disorder, osteopenia, stroke Left hemiparesis 2014  PAIN:  Are you having pain? Yes: NPRS scale: ranges from  0/10 - 5/10 Pain location: right knee in joint Pain description: throbbing Aggravating factors: standing or walking too much, or twisting motion Relieving factors: meds or medicated lotions  PRECAUTIONS: Fall  WEIGHT BEARING RESTRICTIONS: No  FALLS: Has patient fallen in last 6 months? Yes. Number of falls 1 no injuries  LIVING ENVIRONMENT: Lives with: lives with their spouse Lives in: Mobile home Home Access: Ramped entrance or 4 steps with double rail to [employer] layout: One level Has following equipment at home: Single point cane, Environmental consultant - 2 wheeled, Wheelchair (manual), [employer],  bed side commode, Grab bars, and Ramped entry  OCCUPATION: retired from nursing  PLOF: Independent prior to Fracture 04/10/2021.  Since fracture was able to get to cane for community until hardware broke.    PATIENT GOALS:  walk independently in community, travel, drive, take care of 4yo great granddaughter  OBJECTIVE:  COGNITION: Overall cognitive status: Within functional limits for tasks assessed  POSTURE: rounded shoulders, forward head, flexed trunk , and weight shift right  LOWER EXTREMITY ROM:  ROM P:passive  A:active Right eval Left eval  Hip flexion    Hip extension    Hip abduction    Hip adduction    Hip internal rotation    Hip external rotation    Knee flexion    Knee extension    Ankle dorsiflexion    Ankle plantarflexion    Ankle inversion    Ankle eversion     (Blank rows = not tested)  LOWER EXTREMITY MMT:  MMT  Right eval Left eval  Hip flexion    Hip extension    Hip abduction    Hip adduction    Hip internal rotation    Hip external rotation    Knee flexion    Knee extension    Ankle dorsiflexion    Ankle plantarflexion    Ankle inversion    Ankle eversion    At Evaluation all strength testing is grossly seated and functionally standing / gait. (Blank rows = not tested)  TRANSFERS: Evaluation on 01/25/2023:  Sit to stand: SBA w/c to RW requires armrests on w/c and stabilization with RW.   Stand to sit: SBA RW to w/c requires armrests on w/c and stabilization with RW.    FUNCTIONAL TESTs:  Sharlene Motts Balance Scale: 15/56    GAIT: 01/26/2023: Patient ambulated 10 feet with rolling walker with minA but required constant cueing for technique and safety.  CURRENT PROSTHETIC WEAR ASSESSMENT: Evaluation on 01/25/2023:  Patient is dependent with: skin check, residual limb care, care of non-amputated limb, prosthetic cleaning, ply sock cleaning, correct ply sock adjustment, proper wear schedule/adjustment, and proper weight-bearing schedule/adjustment Donning prosthesis: Min A Doffing prosthesis: SBA Prosthetic wear tolerance: tolerated wear 1 hour prior to PT evaluation.  Prosthetic weight bearing tolerance: 5 minutes with BUE support on RW with limited wear on prosthesis.  Edema: pitting Prosthetic description: Ischial Containment socket with flexible inner socket, silicon liner with velcro strap suspension.     TODAY'S TREATMENT:                                                                                                                             DATE:  01/28/2023: Prosthetic Training with Transfemoral Prosthesis: Patient still has 6 small blisters 74mm-5mm distal limb around incision.  All are fluid filled with no signs of infection. Continue recommendation for wear 2 hours 2-3 times per day.  Pt verbalized understanding.  PT verbal cues on recommendations for using a toilet in  public without  prosthesis.  Pt verbalized understanding and thinks it will help.  Pt donned liner & prosthesis (including tightening suspension strap)  with supervision / verbal cues and intermittent minA.   Sit to/from stand w/c & 18" chair pushing off seat to RW with verbal cues / supervision.   Pt amb 20' X 2 including turning 90* & 180* to position in front of chair with RW with minA & verbal cues on technique.  PT verbal cues to pt & her fiance on safe assistance and how to determine 20' from chair.  Both verbalized understanding and comfortable beginning to work on basic / limited gait at home.    TREATMENT:                                                                                                                             DATE:  01/26/2023: Prosthetic Training with Transfemoral Prosthesis: Patient has 6 small blisters 32mm-5mm distal limb around incision.  All are fluid filled with no signs of infection.  PT re-assessed at end of session with 50 min of activities with no change in blisters.  Blisters appear to be from air pocket inside the liner and wear longer than indicated for initial wear. PT verbal with some demo educated pt & her fiance, [name], on cleaning liner, positioning in sitting with prosthetic foot in contact with floor or support surface, using folded towel under left pelvis to level pelvis for sitting >5-10 minutes, sweating with amputation/prosthesis and use of antiperspirant on limb.  Toileting managing pants with single UE RW support, sit to/from stand and positioning to decrease chance of urine or BM getting on prosthetic liner.  PT recommending initial wear to be 1 hour 3 times per day.  Patient should remove liner and prosthesis between wears for 2 or more hours.  Pt & fiance verbalized understanding.   PT demo and verbal cues on proper donning of liner and prosthesis including tightening strap in standing.  Patient and fianc verbalized understanding. PT demo and  verbal cues on sit to / from stand technique with proper prosthetic control.  Patient able to stand from 20"  wheelchair using armrest to rolling walker with supervision and verbal cues.  Patient able to sit down with supervision and verbal cues.  Patient's fianc videotaped patient performing activity with PT directions.  Patient to review this video multiple times a day to get the techniques that in her mind.  Patient verbalized understanding PT demo and verbal cues on gait with rolling walker and prosthesis.  Patient ambulated 10 feet with rolling walker with minA but required constant cueing for technique and safety.    TREATMENT:  DATE:  01/25/2023: Evaluation only  HOME EXERCISE PROGRAM:  ASSESSMENT:  CLINICAL IMPRESSION: Pt improved her ability to don, sit/stand and basic gait with prosthesis.  She appears safe to work on skills with fiance's assistance.  Patient continues to benefit from skilled PT.    OBJECTIVE IMPAIRMENTS: Abnormal gait, decreased activity tolerance, decreased balance, decreased endurance, decreased knowledge of condition, decreased knowledge of use of DME, decreased mobility, difficulty walking, decreased ROM, decreased strength, increased edema, impaired flexibility, postural dysfunction, prosthetic dependency , obesity, and pain.   ACTIVITY LIMITATIONS: carrying, lifting, bending, sitting, standing, stairs, transfers, and locomotion level  PARTICIPATION LIMITATIONS: meal prep, cleaning, and community activity  PERSONAL FACTORS: Fitness, Time since onset of injury/illness/exacerbation, and 3+ comorbidities: see PMH  are also affecting patient's functional outcome.   REHAB POTENTIAL: Good  CLINICAL DECISION MAKING: Evolving/moderate complexity  EVALUATION COMPLEXITY: Moderate   GOALS: Goals reviewed with patient? Yes  SHORT TERM  GOALS: Target date: 02/25/2023  Patient donnes prosthesis modified independent & verbalizes proper cleaning. Baseline: SEE OBJECTIVE DATA Goal status: Ongoing  01/28/2023 2.  Patient tolerates prosthesis >10 hrs total /day without skin issues or limb pain after standing. Baseline: SEE OBJECTIVE DATA Goal status: Ongoing  01/28/2023  3.  Berg Balance >20/56 Baseline: SEE OBJECTIVE DATA Goal status: Ongoing  01/28/2023  4. Patient ambulates 20' with RW & prosthesis with supervision. Baseline: SEE OBJECTIVE DATA Goal status: Ongoing  01/28/2023  5. Patient negotiates ramps & curbs with RW & prosthesis with minA. Baseline: SEE OBJECTIVE DATA Goal status: Ongoing  01/28/2023  LONG TERM GOALS: Target date: 04/22/2023  Patient demonstrates & verbalized understanding of prosthetic care to enable safe utilization of prosthesis. Baseline: SEE OBJECTIVE DATA Goal status: Ongoing  01/28/2023  Patient tolerates prosthesis wear >90% of awake hours without skin or residual limb pain issues. Baseline: SEE OBJECTIVE DATA Goal status: Ongoing  01/28/2023  Berg Balance >35/56 to indicate lower fall risk Baseline: SEE OBJECTIVE DATA Goal status: Ongoing  01/28/2023  Patient ambulates >300' with prosthesis and cane or less modified independent. Baseline: SEE OBJECTIVE DATA Goal status: Ongoing  01/28/2023  Patient negotiates ramps, curbs & stairs with single rail with prosthesis  and cane or less modified independent. Baseline: SEE OBJECTIVE DATA Goal status: Ongoing  01/28/2023   Patient reports right knee pain </= 2/10 with standing and gait activities.   Baseline: SEE OBJECTIVE DATA Goal status:   Ongoing  01/28/2023  PLAN:  PT FREQUENCY:  3 x for 1 week, then 2x/week  PT DURATION: 90 days / 13 weeks  PLANNED INTERVENTIONS: 97164- PT Re-evaluation, 97110-Therapeutic exercises, 97530- Therapeutic activity, 97112- Neuromuscular re-education, [phone]- Self Care, 47829- Gait training,  [phone]- Prosthetic training, Patient/Family education, Balance training, Stair training, DME instructions, and physical performance testing  PLAN FOR NEXT SESSION:   Assess skin, review donning, work on sit to/from stand and gait with rolling walker.  Set up HEP at sink.  Date of referral: 12/22/2022 Referring provider: Aldean Baker, MD Referring diagnosis? Y86.578 (ICD-10-CM) - Hx of AKA (above knee amputation), left  Treatment diagnosis? (if different than referring diagnosis)  Other abnormalities of gait and mobility  ICD-10-CM: R26.89   Unsteadiness on feet  ICD-10-CM: R26.81   Muscle weakness (generalized)  ICD-10-CM: M62.81   Chronic pain of right knee  M25.561 & G89.29  History of falling Z91.81  What was this (referring dx) caused by? Surgery (Type: AKA)  Nature of Condition: Initial Onset (within last 3 months) for prosthesis delivery   Laterality: Lt  Current Functional Measure Score: Other Berg 15/56  Objective measurements identify impairments when they are compared to normal values, the uninvolved extremity, and prior level of function.  [x]  Yes  []  No  Objective assessment of functional ability: Moderate functional limitations   Briefly describe symptoms: patient received prosthesis 01/25/2023 and is dependent in use, care and functional activities.  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    Average pain intensity:  Last 24 hours: 3  Past week: 3  How often does the pt experience symptoms? Constantly  How much have the symptoms interfered with usual daily activities? Extremely  How has condition changed since care began at this facility? NA - initial visit  In general, how is the patients overall health? Very Good   BACK PAIN (STarT Back Screening Tool) No   Vladimir Faster, PT, DPT 01/28/2023, 1:04 PM

## 2023-01-31 ENCOUNTER — Other Ambulatory Visit: Payer: Self-pay | Admitting: Internal Medicine

## 2023-02-01 ENCOUNTER — Ambulatory Visit (INDEPENDENT_AMBULATORY_CARE_PROVIDER_SITE_OTHER): Payer: Medicare Other | Admitting: Physical Therapy

## 2023-02-01 ENCOUNTER — Encounter: Payer: Self-pay | Admitting: Physical Therapy

## 2023-02-01 DIAGNOSIS — M25561 Pain in right knee: Secondary | ICD-10-CM | POA: Diagnosis not present

## 2023-02-01 DIAGNOSIS — M6281 Muscle weakness (generalized): Secondary | ICD-10-CM

## 2023-02-01 DIAGNOSIS — R2681 Unsteadiness on feet: Secondary | ICD-10-CM

## 2023-02-01 DIAGNOSIS — R2689 Other abnormalities of gait and mobility: Secondary | ICD-10-CM

## 2023-02-01 DIAGNOSIS — G8929 Other chronic pain: Secondary | ICD-10-CM

## 2023-02-01 DIAGNOSIS — Z9181 History of falling: Secondary | ICD-10-CM

## 2023-02-01 NOTE — Therapy (Signed)
OUTPATIENT PHYSICAL THERAPY PROSTHETIC TREATMENT   Patient Name: Tammy [name] MRN: [id number] DOB:[birth year], 67 y.o., female Today's Date: 02/01/2023  END OF SESSION:  PT End of Session - 02/01/23 1020     Visit Number 4    Number of Visits 27    Date for PT Re-Evaluation 04/22/23    Authorization Type [employer] Medicare    Authorization Time Period $20 COPAY 16 PT VISITS APPROVED 12/16-2/11/2023    Authorization - Visit Number 4    Authorization - Number of Visits 16    Progress Note Due on Visit 10    PT Start Time 1017    PT Stop Time 1100    PT Time Calculation (min) 43 min    Equipment Utilized During Treatment Gait belt    Activity Tolerance Patient tolerated treatment well;Patient limited by fatigue    Behavior During Therapy WFL for tasks assessed/performed                Past Medical History:  Diagnosis Date   Anemia    hx of   Anxiety    on meds   Arthritis    back/bilateral ankles   Bipolar disorder (HCC)    Breast cancer (HCC) 2022   LEFT breast-radiation   Depression    on meds   Family history of breast cancer 07/10/2020   Family history of colon cancer 07/10/2020   Family history of ovarian cancer 07/10/2020   GERD (gastroesophageal reflux disease)    on meds   Heart murmur    dx by PCP   Hepatitis C    HTN (hypertension)    on meds   Hx of radiation therapy    Liver cirrhosis (HCC)    from hep c   Neuromuscular disorder (HCC)    Osteopenia    Personal history of radiation therapy    Stroke (HCC) 09/2012   slurred speech and LEFT sided weakness/notes 10/04/2012   Past Surgical History:  Procedure Laterality Date   AMPUTATION Left 11/04/2022   Procedure: LEFT ABOVE KNEE AMPUTATION;  Surgeon: Nadara Mustard, MD;  Location: [facility] OR;  Service: Orthopedics;  Laterality: Left;   ANKLE FRACTURE SURGERY Left 2018   APPLICATION OF WOUND VAC Left 06/15/2022   Procedure: APPLICATION OF WOUND VAC;  Surgeon: Peggye Form, DO;   Location: [facility] ORS;  Service: Plastics;  Laterality: Left;   APPLICATION OF WOUND VAC Left 09/14/2022   Procedure: APPLICATION OF WOUND VAC;  Surgeon: Peggye Form, DO;  Location: [facility] OR;  Service: Plastics;  Laterality: Left;   BREAST LUMPECTOMY Left 07/2020   BREAST LUMPECTOMY WITH RADIOACTIVE SEED AND SENTINEL LYMPH NODE BIOPSY Left 07/26/2020   Procedure: LEFT BREAST LUMPECTOMY WITH RADIOACTIVE SEED AND SENTINEL LYMPH NODE BIOPSY;  Surgeon: Abigail Miyamoto, MD;  Location: [facility];  Service: General;  Laterality: Left;   CESAREAN SECTION  1986   COLONOSCOPY     DIAGNOSTIC LAPAROSCOPY  1985   HARDWARE REMOVAL Left 04/30/2022   Procedure: HARDWARE REMOVAL LEFT FEMUR;  Surgeon: Samson Frederic, MD;  Location: [facility];  Service: Orthopedics;  Laterality: Left;  150   HARDWARE REMOVAL Left 11/04/2022   Procedure: HARDWARE REMOVAL INFECTED LEFT TOTAL KNEE ARTHROPLASTY;  Surgeon: Nadara Mustard, MD;  Location: [facility] OR;  Service: Orthopedics;  Laterality: Left;   I & D KNEE WITH POLY EXCHANGE Left 06/07/2022   Procedure: IRRIGATION AND DEBRIDEMENT KNEE WITH POLY EXCHANGE;  Surgeon: Samson Frederic, MD;  Location: [facility];  Service: Orthopedics;  Laterality: Left;   INCISION AND DRAINAGE OF WOUND Left 06/04/2022   Procedure: IRRIGATION AND DEBRIDEMENT KNEE;  Surgeon: Samson Frederic, MD;  Location: [facility];  Service: Orthopedics;  Laterality: Left;  60   INCISION AND DRAINAGE OF WOUND Left 06/15/2022   Procedure: excision of knee wound with Myriad;  Surgeon: Peggye Form, DO;  Location: [facility];  Service: Plastics;  Laterality: Left;   ORIF FEMUR FRACTURE Left 04/10/2021   Procedure: OPEN REDUCTION INTERNAL FIXATION (ORIF) DISTAL FEMUR FRACTURE;  Surgeon: Myrene Galas, MD;  Location: [facility] OR;  Service: Orthopedics;  Laterality: Left;   PARTIAL KNEE ARTHROPLASTY Left 04/30/2022   Procedure: DISTAL FEMUR REPLACEMENT;  Surgeon: Samson Frederic, MD;  Location: [facility];  Service:  Orthopedics;  Laterality: Left;  150   Patient Active Problem List   Diagnosis Date Noted   Bipolar 1 disorder, depressed, partial remission (HCC) 11/18/2022   Left above-knee amputee (HCC) 11/13/2022   Infection of total knee replacement (HCC) 11/04/2022   S/P AKA (above knee amputation) unilateral, left (HCC) 11/04/2022   Chronic hepatitis C with cirrhosis (HCC) 06/06/2022   Surgical wound dehiscence, initial encounter 06/04/2022   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, left, with nonunion, subsequent encounter 04/30/2022   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 04/30/2022   Hypokalemia 04/16/2021   Hip fracture requiring operative repair (HCC) 04/16/2021   Vitamin D deficiency 04/12/2021   Hyponatremia 04/11/2021   Acute postoperative anemia due to expected blood loss 04/11/2021   Class 1 obesity 04/11/2021   Left comminuted and displaced fracture of the distal femur metaphysis 04/08/2021   History of CVA (cerebrovascular accident) 04/08/2021   Genetic testing 07/17/2020   Family history of breast cancer 07/10/2020   Family history of ovarian cancer 07/10/2020   Family history of colon cancer 07/10/2020   Malignant neoplasm of upper-outer quadrant of left breast in female, estrogen receptor positive (HCC) 07/09/2020   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   Chronic pain syndrome 10/06/2012   Nicotine dependence 10/06/2012   Anxiety state 10/05/2012   Bipolar disorder, unspecified (HCC) 10/05/2012   Slurred speech 10/04/2012   Left-sided weakness 10/04/2012   Hyperkalemia 10/04/2012   Acute CVA (cerebrovascular accident) (HCC) 10/04/2012   Hypertension     PCP: Quitman Livings, MD  REFERRING PROVIDER: Aldean Baker, MD  ONSET DATE: 01/25/2023 prosthesis delivery  REFERRING DIAG: I69.629 (ICD-10-CM) - Hx of AKA (above knee amputation), left   THERAPY DIAG:  Other abnormalities of gait and  mobility  Unsteadiness on feet  Muscle weakness (generalized)  Chronic pain of right knee  History of falling  Rationale for Evaluation and Treatment: Rehabilitation  SUBJECTIVE:   SUBJECTIVE STATEMENT: The blisters are dry and healing.  She has been wearing prosthesis 2 hrs 2x/day. She walked with RW with fiance.       Pt accompanied by: significant other  PERTINENT HISTORY: anemia, anxiety, arthritis, Bipolar disorder, Breast CA left with radiation, HTN, Hep C, liver cirrhosis, Neuromuscular disorder, osteopenia, stroke Left hemiparesis 2014  PAIN:  Are you having pain? Yes: NPRS scale: ranges from 0/10 - 3/10 Pain location: right knee in joint Pain description: throbbing Aggravating factors: standing or walking too much, or twisting motion Relieving factors: meds or medicated lotions  PRECAUTIONS: Fall  WEIGHT BEARING RESTRICTIONS: No  FALLS: Has patient fallen in last 6 months? Yes. Number of falls 1 no injuries  LIVING ENVIRONMENT: Lives with: lives with their spouse  Lives in: Mobile home Home Access: Ramped entrance or 4 steps with double rail to [employer] layout: One level Has following equipment at home: Single point cane, Environmental consultant - 2 wheeled, Wheelchair (manual), Shower bench, bed side commode, Grab bars, and Ramped entry  OCCUPATION: retired from nursing  PLOF: Independent prior to Fracture 04/10/2021.  Since fracture was able to get to cane for community until hardware broke.    PATIENT GOALS:  walk independently in community, travel, drive, take care of 4yo great granddaughter  OBJECTIVE:  COGNITION: Overall cognitive status: Within functional limits for tasks assessed  POSTURE: rounded shoulders, forward head, flexed trunk , and weight shift right  LOWER EXTREMITY ROM:  ROM P:passive  A:active Right eval Left eval  Hip flexion    Hip extension    Hip abduction    Hip adduction    Hip internal rotation    Hip external rotation    Knee flexion     Knee extension    Ankle dorsiflexion    Ankle plantarflexion    Ankle inversion    Ankle eversion     (Blank rows = not tested)  LOWER EXTREMITY MMT:  MMT Right eval Left eval  Hip flexion    Hip extension    Hip abduction    Hip adduction    Hip internal rotation    Hip external rotation    Knee flexion    Knee extension    Ankle dorsiflexion    Ankle plantarflexion    Ankle inversion    Ankle eversion    At Evaluation all strength testing is grossly seated and functionally standing / gait. (Blank rows = not tested)  TRANSFERS: Evaluation on 01/25/2023:  Sit to stand: SBA w/c to RW requires armrests on w/c and stabilization with RW.   Stand to sit: SBA RW to w/c requires armrests on w/c and stabilization with RW.    FUNCTIONAL TESTs:  Sharlene Motts Balance Scale: 15/56    GAIT: 01/26/2023: Patient ambulated 10 feet with rolling walker with minA but required constant cueing for technique and safety.  CURRENT PROSTHETIC WEAR ASSESSMENT: Evaluation on 01/25/2023:  Patient is dependent with: skin check, residual limb care, care of non-amputated limb, prosthetic cleaning, ply sock cleaning, correct ply sock adjustment, proper wear schedule/adjustment, and proper weight-bearing schedule/adjustment Donning prosthesis: Min A Doffing prosthesis: SBA Prosthetic wear tolerance: tolerated wear 1 hour prior to PT evaluation.  Prosthetic weight bearing tolerance: 5 minutes with BUE support on RW with limited wear on prosthesis.  Edema: pitting Prosthetic description: Ischial Containment socket with flexible inner socket, silicon liner with velcro strap suspension.     TODAY'S TREATMENT:                                                                                                                             DATE:  02/01/2023: Prosthetic Training with Transfemoral Prosthesis: Pt arrived with liner & socks properly donned.  She donned the prosthesis  including tightening the  suspension strap with minA / supervision.  Pt recommended increasing wear to 3hrs 3x/day with liner & prosthesis off for 2 hours between wears.  Wear shrinker at all times prosthesis is off.  PT educated on rationale for building up to wear all awake hours. PT also reviewed sitting with folded towel or pillow under right side to level pelvis and placing cloth or hand under stomach so socket does not rub.  Pt verbalized understanding.  PT demo & verbal sit to/from stand from 18" chair without armrests using RUE to push on chair seat & RW for stabilization. Pt performed 2 reps with minA 1st rep & CGA 2nd rep.   Pt amb 20' & 25' (max tolerable until she fatigued) with RW with supervision.  PT demo & verbal cues how to follow with w/c to enable supervision / assist as needed & ability to sit when tired. Pt & fiance verbalized understanding.  PT demo & verbal cues on negotiating curb with RW & TFA prosthesis.  Pt neg 6.5" curb with RW with minA & constant verbal cues.      TREATMENT:                                                                                                                             DATE:  01/28/2023: Prosthetic Training with Transfemoral Prosthesis: Patient still has 6 small blisters 40mm-5mm distal limb around incision.  All are fluid filled with no signs of infection. Continue recommendation for wear 2 hours 2-3 times per day.  Pt verbalized understanding.  PT verbal cues on recommendations for using a toilet in public without prosthesis.  Pt verbalized understanding and thinks it will help.  Pt donned liner & prosthesis (including tightening suspension strap)  with supervision / verbal cues and intermittent minA.   Sit to/from stand w/c & 18" chair pushing off seat to RW with verbal cues / supervision.   Pt amb 20' X 2 including turning 90* & 180* to position in front of chair with RW with minA & verbal cues on technique.  PT verbal cues to pt & her fiance on safe assistance and  how to determine 20' from chair.  Both verbalized understanding and comfortable beginning to work on basic / limited gait at home.    TREATMENT:  DATE:  01/26/2023: Prosthetic Training with Transfemoral Prosthesis: Patient has 6 small blisters 5mm-5mm distal limb around incision.  All are fluid filled with no signs of infection.  PT re-assessed at end of session with 50 min of activities with no change in blisters.  Blisters appear to be from air pocket inside the liner and wear longer than indicated for initial wear. PT verbal with some demo educated pt & her fiance, Tammy [name], on cleaning liner, positioning in sitting with prosthetic foot in contact with floor or support surface, using folded towel under left pelvis to level pelvis for sitting >5-10 minutes, sweating with amputation/prosthesis and use of antiperspirant on limb.  Toileting managing pants with single UE RW support, sit to/from stand and positioning to decrease chance of urine or BM getting on prosthetic liner.  PT recommending initial wear to be 1 hour 3 times per day.  Patient should remove liner and prosthesis between wears for 2 or more hours.  Pt & fiance verbalized understanding.   PT demo and verbal cues on proper donning of liner and prosthesis including tightening strap in standing.  Patient and fianc verbalized understanding. PT demo and verbal cues on sit to / from stand technique with proper prosthetic control.  Patient able to stand from 20"  wheelchair using armrest to rolling walker with supervision and verbal cues.  Patient able to sit down with supervision and verbal cues.  Patient's fianc videotaped patient performing activity with PT directions.  Patient to review this video multiple times a day to get the techniques that in her mind.  Patient verbalized understanding PT demo and verbal cues on gait  with rolling walker and prosthesis.  Patient ambulated 10 feet with rolling walker with minA but required constant cueing for technique and safety.    TREATMENT:                                                                                                                             DATE:  01/25/2023: Evaluation only  HOME EXERCISE PROGRAM:  ASSESSMENT:  CLINICAL IMPRESSION: Patient improved her donning & prosthetic gait with RW.  PT introduced curb today and she will need further work prior to trying outside of PT.   Patient continues to benefit from skilled PT.    OBJECTIVE IMPAIRMENTS: Abnormal gait, decreased activity tolerance, decreased balance, decreased endurance, decreased knowledge of condition, decreased knowledge of use of DME, decreased mobility, difficulty walking, decreased ROM, decreased strength, increased edema, impaired flexibility, postural dysfunction, prosthetic dependency , obesity, and pain.   ACTIVITY LIMITATIONS: carrying, lifting, bending, sitting, standing, stairs, transfers, and locomotion level  PARTICIPATION LIMITATIONS: meal prep, cleaning, and community activity  PERSONAL FACTORS: Fitness, Time since onset of injury/illness/exacerbation, and 3+ comorbidities: see PMH  are also affecting patient's functional outcome.   REHAB POTENTIAL: Good  CLINICAL DECISION MAKING: Evolving/moderate complexity  EVALUATION COMPLEXITY: Moderate   GOALS: Goals reviewed with patient? Yes  SHORT TERM GOALS: Target date: 02/25/2023  Patient donnes prosthesis modified independent & verbalizes proper cleaning. Baseline: SEE OBJECTIVE DATA Goal status: Ongoing  01/28/2023 2.  Patient tolerates prosthesis >10 hrs total /day without skin issues or limb pain after standing. Baseline: SEE OBJECTIVE DATA Goal status: Ongoing  01/28/2023  3.  Berg Balance >20/56 Baseline: SEE OBJECTIVE DATA Goal status: Ongoing  01/28/2023  4. Patient ambulates 44' with RW &  prosthesis with supervision. Baseline: SEE OBJECTIVE DATA Goal status: Ongoing  01/28/2023  5. Patient negotiates ramps & curbs with RW & prosthesis with minA. Baseline: SEE OBJECTIVE DATA Goal status: Ongoing  01/28/2023  LONG TERM GOALS: Target date: 04/22/2023  Patient demonstrates & verbalized understanding of prosthetic care to enable safe utilization of prosthesis. Baseline: SEE OBJECTIVE DATA Goal status: Ongoing  01/28/2023  Patient tolerates prosthesis wear >90% of awake hours without skin or residual limb pain issues. Baseline: SEE OBJECTIVE DATA Goal status: Ongoing  01/28/2023  Berg Balance >35/56 to indicate lower fall risk Baseline: SEE OBJECTIVE DATA Goal status: Ongoing  01/28/2023  Patient ambulates >300' with prosthesis and cane or less modified independent. Baseline: SEE OBJECTIVE DATA Goal status: Ongoing  01/28/2023  Patient negotiates ramps, curbs & stairs with single rail with prosthesis  and cane or less modified independent. Baseline: SEE OBJECTIVE DATA Goal status: Ongoing  01/28/2023   Patient reports right knee pain </= 2/10 with standing and gait activities.   Baseline: SEE OBJECTIVE DATA Goal status:   Ongoing  01/28/2023  PLAN:  PT FREQUENCY:  3 x for 1 week, then 2x/week  PT DURATION: 90 days / 13 weeks  PLANNED INTERVENTIONS: 97164- PT Re-evaluation, 97110-Therapeutic exercises, 97530- Therapeutic activity, 97112- Neuromuscular re-education, [phone]- Self Care, 60454- Gait training, [phone]- Prosthetic training, Patient/Family education, Balance training, Stair training, DME instructions, and physical performance testing  PLAN FOR NEXT SESSION:   ask about skin & donning, work on sit to/from stand and gait with rolling walker including curb.  Weather permitting try car transfer.   Date of referral: 12/22/2022 Referring provider: Aldean Baker, MD Referring diagnosis? B14.782 (ICD-10-CM) - Hx of AKA (above knee amputation), left  Treatment  diagnosis? (if different than referring diagnosis)  Other abnormalities of gait and mobility  ICD-10-CM: R26.89   Unsteadiness on feet  ICD-10-CM: R26.81   Muscle weakness (generalized)  ICD-10-CM: M62.81   Chronic pain of right knee  M25.561 & G89.29  History of falling Z91.81  What was this (referring dx) caused by? Surgery (Type: AKA)  Nature of Condition: Initial Onset (within last 3 months) for prosthesis delivery   Laterality: Lt  Current Functional Measure Score: Other Berg 15/56  Objective measurements identify impairments when they are compared to normal values, the uninvolved extremity, and prior level of function.  [x]  Yes  []  No  Objective assessment of functional ability: Moderate functional limitations   Briefly describe symptoms: patient received prosthesis 01/25/2023 and is dependent in use, care and functional activities.  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    Average pain intensity:  Last 24 hours: 3  Past week: 3  How often does the pt experience symptoms? Constantly  How much have the symptoms interfered with usual daily activities? Extremely  How has condition changed since care began at this facility? NA - initial visit  In general, how is the patients overall health? Very Good   BACK PAIN (STarT Back Screening Tool) No   Vladimir Faster, PT, DPT 02/01/2023, 11:11 AM

## 2023-02-09 ENCOUNTER — Encounter: Payer: Medicare Other | Admitting: Physical Therapy

## 2023-02-18 ENCOUNTER — Telehealth: Payer: Self-pay | Admitting: Physical Therapy

## 2023-02-18 ENCOUNTER — Encounter: Payer: Medicare Other | Admitting: Physical Therapy

## 2023-02-18 ENCOUNTER — Telehealth: Payer: Medicare Other | Admitting: Family Medicine

## 2023-02-18 DIAGNOSIS — K920 Hematemesis: Secondary | ICD-10-CM

## 2023-02-18 NOTE — Telephone Encounter (Signed)
 PT called patient due to no-show appointment.  She has been having severe GI issues.  PT made patient aware of attendance policy and need to call to cancel appts.

## 2023-02-18 NOTE — Patient Instructions (Signed)
 Please go the ED for assessment and treatment.

## 2023-02-18 NOTE — Progress Notes (Signed)
 Tammy [name]   Having coffee ground emesis for 1 week with weakness. Visit stopped ED recommended ASAP  Patient acknowledged agreement and understanding of the plan.

## 2023-02-19 ENCOUNTER — Encounter (HOSPITAL_COMMUNITY): Payer: Self-pay

## 2023-02-19 ENCOUNTER — Other Ambulatory Visit: Payer: Self-pay

## 2023-02-19 ENCOUNTER — Inpatient Hospital Stay (HOSPITAL_COMMUNITY)
Admission: EM | Admit: 2023-02-19 | Discharge: 2023-02-21 | DRG: 813 | Disposition: A | Discharge status: Home / Self Care | Payer: Medicare Other | Attending: Internal Medicine | Admitting: Internal Medicine

## 2023-02-19 DIAGNOSIS — Z803 Family history of malignant neoplasm of breast: Secondary | ICD-10-CM | POA: Diagnosis not present

## 2023-02-19 DIAGNOSIS — I82412 Acute embolism and thrombosis of left femoral vein: Secondary | ICD-10-CM | POA: Insufficient documentation

## 2023-02-19 DIAGNOSIS — C50412 Malignant neoplasm of upper-outer quadrant of left female breast: Secondary | ICD-10-CM

## 2023-02-19 DIAGNOSIS — K920 Hematemesis: Principal | ICD-10-CM | POA: Diagnosis present

## 2023-02-19 DIAGNOSIS — K449 Diaphragmatic hernia without obstruction or gangrene: Secondary | ICD-10-CM | POA: Diagnosis present

## 2023-02-19 DIAGNOSIS — Z923 Personal history of irradiation: Secondary | ICD-10-CM

## 2023-02-19 DIAGNOSIS — K921 Melena: Principal | ICD-10-CM | POA: Diagnosis present

## 2023-02-19 DIAGNOSIS — Z8781 Personal history of (healed) traumatic fracture: Secondary | ICD-10-CM

## 2023-02-19 DIAGNOSIS — Z170 Estrogen receptor positive status [ER+]: Secondary | ICD-10-CM | POA: Diagnosis not present

## 2023-02-19 DIAGNOSIS — F319 Bipolar disorder, unspecified: Secondary | ICD-10-CM | POA: Diagnosis present

## 2023-02-19 DIAGNOSIS — Z89612 Acquired absence of left leg above knee: Secondary | ICD-10-CM | POA: Diagnosis not present

## 2023-02-19 DIAGNOSIS — Z79899 Other long term (current) drug therapy: Secondary | ICD-10-CM | POA: Diagnosis not present

## 2023-02-19 DIAGNOSIS — K219 Gastro-esophageal reflux disease without esophagitis: Secondary | ICD-10-CM | POA: Diagnosis present

## 2023-02-19 DIAGNOSIS — Z800 Family history of malignant neoplasm of digestive organs: Secondary | ICD-10-CM

## 2023-02-19 DIAGNOSIS — M8580 Other specified disorders of bone density and structure, unspecified site: Secondary | ICD-10-CM | POA: Diagnosis present

## 2023-02-19 DIAGNOSIS — Z8619 Personal history of other infectious and parasitic diseases: Secondary | ICD-10-CM | POA: Diagnosis not present

## 2023-02-19 DIAGNOSIS — R011 Cardiac murmur, unspecified: Secondary | ICD-10-CM | POA: Diagnosis present

## 2023-02-19 DIAGNOSIS — G894 Chronic pain syndrome: Secondary | ICD-10-CM | POA: Diagnosis present

## 2023-02-19 DIAGNOSIS — E876 Hypokalemia: Secondary | ICD-10-CM | POA: Diagnosis present

## 2023-02-19 DIAGNOSIS — D6832 Hemorrhagic disorder due to extrinsic circulating anticoagulants: Secondary | ICD-10-CM | POA: Diagnosis present

## 2023-02-19 DIAGNOSIS — F419 Anxiety disorder, unspecified: Secondary | ICD-10-CM | POA: Diagnosis present

## 2023-02-19 DIAGNOSIS — Z87891 Personal history of nicotine dependence: Secondary | ICD-10-CM | POA: Diagnosis not present

## 2023-02-19 DIAGNOSIS — Z7901 Long term (current) use of anticoagulants: Secondary | ICD-10-CM

## 2023-02-19 DIAGNOSIS — I69354 Hemiplegia and hemiparesis following cerebral infarction affecting left non-dominant side: Secondary | ICD-10-CM | POA: Diagnosis not present

## 2023-02-19 DIAGNOSIS — Z86718 Personal history of other venous thrombosis and embolism: Secondary | ICD-10-CM

## 2023-02-19 DIAGNOSIS — K922 Gastrointestinal hemorrhage, unspecified: Secondary | ICD-10-CM | POA: Diagnosis not present

## 2023-02-19 DIAGNOSIS — Z888 Allergy status to other drugs, medicaments and biological substances status: Secondary | ICD-10-CM

## 2023-02-19 DIAGNOSIS — K2100 Gastro-esophageal reflux disease with esophagitis, without bleeding: Secondary | ICD-10-CM | POA: Diagnosis not present

## 2023-02-19 DIAGNOSIS — B182 Chronic viral hepatitis C: Secondary | ICD-10-CM | POA: Diagnosis present

## 2023-02-19 DIAGNOSIS — K317 Polyp of stomach and duodenum: Secondary | ICD-10-CM | POA: Diagnosis present

## 2023-02-19 DIAGNOSIS — K7460 Unspecified cirrhosis of liver: Secondary | ICD-10-CM | POA: Diagnosis present

## 2023-02-19 DIAGNOSIS — Z79811 Long term (current) use of aromatase inhibitors: Secondary | ICD-10-CM

## 2023-02-19 DIAGNOSIS — I10 Essential (primary) hypertension: Secondary | ICD-10-CM | POA: Diagnosis present

## 2023-02-19 DIAGNOSIS — Z8673 Personal history of transient ischemic attack (TIA), and cerebral infarction without residual deficits: Secondary | ICD-10-CM

## 2023-02-19 DIAGNOSIS — Z853 Personal history of malignant neoplasm of breast: Secondary | ICD-10-CM | POA: Diagnosis not present

## 2023-02-19 DIAGNOSIS — K222 Esophageal obstruction: Secondary | ICD-10-CM | POA: Diagnosis not present

## 2023-02-19 DIAGNOSIS — M1990 Unspecified osteoarthritis, unspecified site: Secondary | ICD-10-CM | POA: Diagnosis present

## 2023-02-19 LAB — COMPREHENSIVE METABOLIC PANEL
ALT: 11 U/L (ref 0–44)
AST: 15 U/L (ref 15–41)
Albumin: 3.2 g/dL — ABNORMAL LOW (ref 3.5–5.0)
Alkaline Phosphatase: 76 U/L (ref 38–126)
Anion gap: 8 (ref 5–15)
BUN: 10 mg/dL (ref 8–23)
CO2: 25 mmol/L (ref 22–32)
Calcium: 9.1 mg/dL (ref 8.9–10.3)
Chloride: 105 mmol/L (ref 98–111)
Creatinine, Ser: 0.65 mg/dL (ref 0.44–1.00)
GFR, Estimated: >60 mL/min (ref >60)
Glucose, Bld: 101 mg/dL — ABNORMAL HIGH (ref 70–99)
Potassium: 3 mmol/L — ABNORMAL LOW (ref 3.5–5.1)
Sodium: 138 mmol/L (ref 135–145)
Total Bilirubin: <0.2 mg/dL (ref 0.0–1.2)
Total Protein: 7.4 g/dL (ref 6.5–8.1)

## 2023-02-19 LAB — CBC
HCT: 25.6 % — ABNORMAL LOW (ref 36.0–46.0)
Hemoglobin: 8.1 g/dL — ABNORMAL LOW (ref 12.0–15.0)
MCH: 26.8 pg (ref 26.0–34.0)
MCHC: 31.6 g/dL (ref 30.0–36.0)
MCV: 84.8 fL (ref 80.0–100.0)
Platelets: 259 10*3/uL (ref 150–400)
RBC: 3.02 MIL/uL — ABNORMAL LOW (ref 3.87–5.11)
RDW: 16.1 % — ABNORMAL HIGH (ref 11.5–15.5)
WBC: 7.1 10*3/uL (ref 4.0–10.5)
nRBC: 0 % (ref 0.0–0.2)

## 2023-02-19 LAB — PHOSPHORUS: Phosphorus: 2.5 mg/dL (ref 2.5–4.6)

## 2023-02-19 LAB — HEMOGLOBIN AND HEMATOCRIT, BLOOD
HCT: 28.8 % — ABNORMAL LOW (ref 36.0–46.0)
HCT: 23.4 % — ABNORMAL LOW (ref 36.0–46.0)
Hemoglobin: 8.9 g/dL — ABNORMAL LOW (ref 12.0–15.0)
Hemoglobin: 7.4 g/dL — ABNORMAL LOW (ref 12.0–15.0)

## 2023-02-19 LAB — PROTIME-INR
INR: 1 (ref 0.8–1.2)
Prothrombin Time: 13.1 s (ref 11.4–15.2)

## 2023-02-19 LAB — MAGNESIUM: Magnesium: 1.8 mg/dL (ref 1.7–2.4)

## 2023-02-19 LAB — POC OCCULT BLOOD, ED: Fecal Occult Bld: POSITIVE — AB

## 2023-02-19 MED ORDER — ACETAMINOPHEN 500 MG PO TABS
500.0000 mg | ORAL_TABLET | Freq: Four times a day (QID) | ORAL | Status: DC | PRN
  Administered 2023-02-19: 500 mg via ORAL
  Filled 2023-02-19 (×2): qty 1

## 2023-02-19 MED ORDER — POTASSIUM CHLORIDE CRYS ER 20 MEQ PO TBCR
40.0000 meq | EXTENDED_RELEASE_TABLET | Freq: Once | ORAL | Status: AC
  Administered 2023-02-19: 40 meq via ORAL
  Filled 2023-02-19: qty 4

## 2023-02-19 MED ORDER — POTASSIUM CHLORIDE IN NACL 20-0.9 MEQ/L-% IV SOLN
INTRAVENOUS | Status: DC
  Filled 2023-02-19: qty 1000

## 2023-02-19 MED ORDER — ACETAMINOPHEN 650 MG RE SUPP: 650.0000 mg | Freq: Four times a day (QID) | RECTAL | Status: DC | PRN

## 2023-02-19 MED ORDER — HYDROCODONE-ACETAMINOPHEN 10-325 MG PO TABS
1.0000 | ORAL_TABLET | ORAL | Status: DC | PRN
  Administered 2023-02-19 – 2023-02-21 (×5): 1 via ORAL
  Filled 2023-02-19 (×5): qty 1

## 2023-02-19 MED ORDER — ACETAMINOPHEN 325 MG RE SUPP: 475.0000 mg | Freq: Four times a day (QID) | RECTAL | Status: DC | PRN

## 2023-02-19 MED ORDER — ONDANSETRON HCL 4 MG PO TABS: 4.0000 mg | ORAL_TABLET | Freq: Four times a day (QID) | ORAL | Status: DC | PRN

## 2023-02-19 MED ORDER — BUPROPION HCL ER (SR) 150 MG PO TB12
150.0000 mg | ORAL_TABLET | Freq: Two times a day (BID) | ORAL | Status: DC
Start: 2023-02-19 — End: 2023-02-21
  Administered 2023-02-19 – 2023-02-21 (×4): 150 mg via ORAL
  Filled 2023-02-19 (×5): qty 1

## 2023-02-19 MED ORDER — ANASTROZOLE 1 MG PO TABS
1.0000 mg | ORAL_TABLET | Freq: Every day | ORAL | Status: DC
  Administered 2023-02-20 – 2023-02-21 (×2): 1 mg via ORAL
  Filled 2023-02-19 (×2): qty 1

## 2023-02-19 MED ORDER — PANTOPRAZOLE SODIUM 40 MG IV SOLR
40.0000 mg | Freq: Once | INTRAVENOUS | Status: AC
  Administered 2023-02-19: 40 mg via INTRAVENOUS
  Filled 2023-02-19: qty 10

## 2023-02-19 MED ORDER — AMOXICILLIN 500 MG PO CAPS
500.0000 mg | ORAL_CAPSULE | Freq: Two times a day (BID) | ORAL | Status: DC
  Administered 2023-02-19: 500 mg via ORAL
  Filled 2023-02-19 (×2): qty 1

## 2023-02-19 MED ORDER — GABAPENTIN 300 MG PO CAPS
300.0000 mg | ORAL_CAPSULE | Freq: Every day | ORAL | Status: DC
Start: 2023-02-19 — End: 2023-02-21
  Administered 2023-02-19 – 2023-02-20 (×2): 300 mg via ORAL
  Filled 2023-02-19 (×2): qty 1

## 2023-02-19 MED ORDER — ACETAMINOPHEN 325 MG PO TABS: 650.0000 mg | ORAL_TABLET | Freq: Four times a day (QID) | ORAL | Status: DC | PRN

## 2023-02-19 MED ORDER — HYDRALAZINE HCL 20 MG/ML IJ SOLN
5.0000 mg | Freq: Four times a day (QID) | INTRAMUSCULAR | Status: DC | PRN
  Administered 2023-02-19: 5 mg via INTRAVENOUS
  Filled 2023-02-19: qty 1

## 2023-02-19 MED ORDER — PREGABALIN 75 MG PO CAPS
150.0000 mg | ORAL_CAPSULE | Freq: Two times a day (BID) | ORAL | Status: DC
  Administered 2023-02-19 – 2023-02-21 (×4): 150 mg via ORAL
  Filled 2023-02-19 (×4): qty 2

## 2023-02-19 MED ORDER — ONDANSETRON HCL 4 MG/2ML IJ SOLN: 4.0000 mg | Freq: Four times a day (QID) | INTRAMUSCULAR | Status: DC | PRN

## 2023-02-19 MED ORDER — AMLODIPINE BESYLATE 10 MG PO TABS: 10.0000 mg | ORAL_TABLET | Freq: Every day | ORAL | Status: DC

## 2023-02-19 NOTE — ED Triage Notes (Signed)
 PT arrives via POV. Pt reports she has experienced coffee ground emesis, dark stools, abdominal discomfort, and intermittent sob for the past week. Pt reports she takes eliquis but stopped taking it when she first had coffee ground emesis. Pt is AxOx4.

## 2023-02-19 NOTE — ED Notes (Signed)
 ED TO INPATIENT HANDOFF REPORT  Name/Age/Gender Tammy [name] 68 y.o. female  Code Status    Code Status Orders  (From admission, onward)           Start     Ordered   02/19/23 1509  Full code  Continuous       Question:  By:  Answer:  Consent: discussion documented in EHR   02/19/23 1508           Code Status History     Date Active Date Inactive Code Status Order ID Comments User Context   11/13/2022 1812 11/23/2022 1739 Full Code [id number]  Rosendo Nena PARAS, PA-C Inpatient   11/13/2022 1812 11/13/2022 1812 Full Code [id number]  Rosendo Nena PARAS, PA-C Inpatient   11/04/2022 1400 11/13/2022 1748 Full Code [id number]  Harden Jerona GAILS, MD Inpatient   06/04/2022 1802 06/18/2022 2242 Full Code [id number]  Leigh Valery GORMAN DEVONNA Inpatient   04/30/2022 1935 05/05/2022 2356 Full Code [id number]  Fidel Rogue, MD Inpatient   04/16/2021 1536 04/22/2021 1748 Full Code [id number]  Maurice Sharlet GORMAN, PA-C Inpatient   04/16/2021 1536 04/16/2021 1536 Full Code [id number]  Maurice Sharlet GORMAN, PA-C Inpatient   04/08/2021 2318 04/16/2021 1534 Full Code [id number]  Ricky Alfrieda DASEN, DO ED   10/04/2012 1608 10/06/2012 1633 Full Code [id number]  Gabriel Noa, MD Inpatient       Home/SNF/Other Home  Chief Complaint Gastrointestinal hemorrhage with melena [K92.1]  Level of Care/Admitting Diagnosis ED Disposition     ED Disposition  Admit   Condition  --   Comment  Hospital Area: [facility] [[id number]]  Level of Care: Stepdown [14]  Admit to SDU based on following criteria: Hemodynamic compromise or significant risk of instability:  Patient requiring short term acute titration and management of vasoactive drips, and invasive monitoring (i.e., CVP and Arterial line).  May admit patient to [facility] or [facility] if equivalent level of care is available:: No  Covid Evaluation: Asymptomatic - no recent exposure (last 10 days) testing not required  Diagnosis: Gastrointestinal hemorrhage with  melena [8675235]  Admitting Physician: CELINDA ALM LOT [[id number]]  Attending Physician: CELINDA ALM LOT [[id number]]  Certification:: I certify this patient will need inpatient services for at least 2 midnights  Expected Medical Readiness: 02/22/2023          Medical History Past Medical History:  Diagnosis Date   Anemia    hx of   Anxiety    on meds   Arthritis    back/bilateral ankles   Bipolar disorder (HCC)    Breast cancer (HCC) 2022   LEFT breast-radiation   Depression    on meds   Family history of breast cancer 07/10/2020   Family history of colon cancer 07/10/2020   Family history of ovarian cancer 07/10/2020   GERD (gastroesophageal reflux disease)    on meds   Heart murmur    dx by PCP   Hepatitis C    HTN (hypertension)    on meds   Hx of radiation therapy    Liver cirrhosis (HCC)    from hep c   Neuromuscular disorder (HCC)    Osteopenia    Personal history of radiation therapy    Stroke (HCC) 09/2012   slurred speech and LEFT sided weakness/notes 10/04/2012    Allergies Allergies  Allergen Reactions   Lisinopril Cough    IV Location/Drains/Wounds Patient Lines/Drains/Airways Status     Active Line/Drains/Airways     Name  Placement date Placement time Site Days   Peripheral IV 02/19/23 22 G 2.5 Anterior;Right;Upper Arm 02/19/23  1630  Arm  less than 1            Labs/Imaging Results for orders placed or performed during the hospital encounter of 02/19/23 (from the past 48 hours)  Comprehensive metabolic panel     Status: Abnormal   Collection Time: 02/19/23 12:11 PM  Result Value Ref Range   Sodium 138 135 - 145 mmol/L   Potassium 3.0 (L) 3.5 - 5.1 mmol/L   Chloride 105 98 - 111 mmol/L   CO2 25 22 - 32 mmol/L   Glucose, Bld 101 (H) 70 - 99 mg/dL    Comment: Glucose reference range applies only to samples taken after fasting for at least 8 hours.   BUN 10 8 - 23 mg/dL   Creatinine, Ser 9.34 0.44 - 1.00 mg/dL   Calcium  9.1  8.9 - 10.3 mg/dL   Total Protein 7.4 6.5 - 8.1 g/dL   Albumin 3.2 (L) 3.5 - 5.0 g/dL   AST 15 15 - 41 U/L   ALT 11 0 - 44 U/L   Alkaline Phosphatase 76 38 - 126 U/L   Total Bilirubin <0.2 0.0 - 1.2 mg/dL   GFR, Estimated >39 >39 mL/min    Comment: (NOTE) Calculated using the CKD-EPI Creatinine Equation (2021)    Anion gap 8 5 - 15    Comment: Performed at [facility], [address], [place], [place] [address]  CBC     Status: Abnormal   Collection Time: 02/19/23 12:11 PM  Result Value Ref Range   WBC 7.1 4.0 - 10.5 K/uL   RBC 3.02 (L) 3.87 - 5.11 MIL/uL   Hemoglobin 8.1 (L) 12.0 - 15.0 g/dL   HCT 74.3 (L) 63.9 - 53.9 %   MCV 84.8 80.0 - 100.0 fL   MCH 26.8 26.0 - 34.0 pg   MCHC 31.6 30.0 - 36.0 g/dL   RDW 83.8 (H) 88.4 - 84.4 %   Platelets 259 150 - 400 K/uL   nRBC 0.0 0.0 - 0.2 %    Comment: Performed at [facility], [address] [place], [place] [address]  Type and screen [facility]     Status: None   Collection Time: 02/19/23 12:11 PM  Result Value Ref Range   ABO/RH(D) AB POS    Antibody Screen NEG    Sample Expiration      02/22/2023,2359 Performed at [facility], [address] [place], [place] [address]   POC occult blood, ED     Status: Abnormal   Collection Time: 02/19/23  2:20 PM  Result Value Ref Range   Fecal Occult Bld POSITIVE (A) NEGATIVE  Hemoglobin and hematocrit, blood     Status: Abnormal   Collection Time: 02/19/23  4:35 PM  Result Value Ref Range   Hemoglobin 8.9 (L) 12.0 - 15.0 g/dL   HCT 71.1 (L) 63.9 - 53.9 %    Comment: Performed at [facility], [address] [place], [place] [address]   No results found.  Pending Labs Unresulted Labs (From admission, onward)     Start     Ordered   02/20/23 0500  HIV Antibody (routine testing w rflx)  (HIV Antibody (Routine testing w reflex) panel)  Tomorrow morning,   R        02/19/23 1508    02/20/23 0500  CBC  Tomorrow morning,   R        02/19/23 1508   02/20/23 0500  Comprehensive metabolic panel  Tomorrow morning,   R        02/19/23 1508   02/19/23 1600  Hemoglobin and hematocrit, blood  Every 6 hours,   R      02/19/23 1455   02/19/23 1453  Protime-INR  Add-on,   AD        02/19/23 1454   02/19/23 1453  Magnesium   Add-on,   AD        02/19/23 1454   02/19/23 1453  Phosphorus  Add-on,   AD        02/19/23 1454            Vitals/Pain Today's Vitals   02/19/23 1155 02/19/23 1202 02/19/23 1525 02/19/23 1650  BP: (!) 149/107     Pulse: 65     Resp: 19     Temp: 99 F (37.2 C)     TempSrc: Oral     SpO2: 100%     PainSc:  0-No pain 3  7     Isolation Precautions No active isolations  Medications Medications  0.9 % NaCl with KCl 20 mEq/ L  infusion (has no administration in time range)  ondansetron  (ZOFRAN ) tablet 4 mg (has no administration in time range)    Or  ondansetron  (ZOFRAN ) injection 4 mg (has no administration in time range)  acetaminophen  (TYLENOL ) tablet 500 mg (500 mg Oral Given 02/19/23 1640)    Or  acetaminophen  (TYLENOL ) suppository 475 mg ( Rectal See Alternative 02/19/23 1640)  amLODipine  (NORVASC ) tablet 10 mg (has no administration in time range)  anastrozole  (ARIMIDEX ) tablet 1 mg (has no administration in time range)  amoxicillin  (AMOXIL ) capsule 500 mg (has no administration in time range)  buPROPion  (WELLBUTRIN  SR) 12 hr tablet 150 mg (has no administration in time range)  gabapentin  (NEURONTIN ) capsule 300 mg (has no administration in time range)  HYDROcodone -acetaminophen  (NORCO) 10-325 MG per tablet 1 tablet (has no administration in time range)  pregabalin  (LYRICA ) capsule 150 mg (has no administration in time range)  pantoprazole  (PROTONIX ) injection 40 mg (40 mg Intravenous Given 02/19/23 1357)  potassium chloride  SA (KLOR-CON  M) CR tablet 40 mEq (40 mEq Oral Given 02/19/23 1355)  pantoprazole  (PROTONIX ) injection 40 mg (40  mg Intravenous Given 02/19/23 1631)    Mobility walks

## 2023-02-19 NOTE — ED Notes (Signed)
 Pt IV infiltrated, will establish new PIV.

## 2023-02-19 NOTE — H&P (Signed)
 History and Physical    Patient: Tammy [name] FMW:[id number] DOB: 24-Jun-1955 DOA: 02/19/2023 DOS: the patient was seen and examined on 02/19/2023 PCP: Norval Kettle, MD  Patient coming from: Home  Chief Complaint:  Chief Complaint  Patient presents with   Shortness of Breath   Hematemesis   GI Bleeding   HPI: Tammy [name] is a 68 y.o. female with medical history significant of anemia, anxiety, osteoarthritis, bipolar disorder, left breast cancer, depression, GERD, hypertension, radiation therapy, osteopenia, history of CVA with residual left-sided hemiparesis, history of hep C liver cirrhosis who has mild epigastric abdominal pain, had 1 or 2 episodes of hematemesis/1 or 2 episodes of Tammy daily since Sunday.  She held her apixaban  on Sunday evening and has not taken it since then.  She has felt tired, occasionally dyspneic on exertion and mildly lightheaded.  To her knowledge, no history of PUD or gastritis.She has not been using NSAIDs.   She denied fever, chills, rhinorrhea, sore throat, wheezing or hemoptysis.  No chest pain, palpitations, diaphoresis, PND, orthopnea or pitting edema of the lower extremities.  No flank pain, dysuria, frequency or hematuria.  No polyuria, polydipsia, polyphagia or blurred vision.   Lab work: CBC 0 white count 7.1, hemoglobin 9.1 g/dL and platelets 740.  Fecal occult blood was positive.  CMP showed potassium 3.0 mmol/L, glucose 101 mg/dL and albumin 3.2 g/dL.  The rest of the CMP measurements were normal.   ED course: Initial vital signs were temperature 99 F, pulse 65 g.  Show 19, BP 149/107 mmHg O2 sat 100% on room air.  Patient received pantoprazole  40 mg IVP x 1 in the emergency department.  Review of Systems: As mentioned in the history of present illness. All other systems reviewed and are negative. Past Medical History:  Diagnosis Date   Anemia    hx of   Anxiety    on meds   Arthritis    back/bilateral ankles   Bipolar  disorder (HCC)    Breast cancer (HCC) 2022   LEFT breast-radiation   Depression    on meds   Family history of breast cancer 07/10/2020   Family history of colon cancer 07/10/2020   Family history of ovarian cancer 07/10/2020   GERD (gastroesophageal reflux disease)    on meds   Heart murmur    dx by PCP   Hepatitis C    HTN (hypertension)    on meds   Hx of radiation therapy    Liver cirrhosis (HCC)    from hep c   Neuromuscular disorder (HCC)    Osteopenia    Personal history of radiation therapy    Stroke (HCC) 09/2012   slurred speech and LEFT sided weakness/notes 10/04/2012   Past Surgical History:  Procedure Laterality Date   AMPUTATION Left 11/04/2022   Procedure: LEFT ABOVE KNEE AMPUTATION;  Surgeon: Harden Jerona GAILS, MD;  Location: [facility] OR;  Service: Orthopedics;  Laterality: Left;   ANKLE FRACTURE SURGERY Left 2018   APPLICATION OF WOUND VAC Left 06/15/2022   Procedure: APPLICATION OF WOUND VAC;  Surgeon: Lowery Estefana RAMAN, DO;  Location: [facility] ORS;  Service: Plastics;  Laterality: Left;   APPLICATION OF WOUND VAC Left 09/14/2022   Procedure: APPLICATION OF WOUND VAC;  Surgeon: Lowery Estefana RAMAN, DO;  Location: [facility] OR;  Service: Plastics;  Laterality: Left;   BREAST LUMPECTOMY Left 07/2020   BREAST LUMPECTOMY WITH RADIOACTIVE SEED AND SENTINEL LYMPH NODE BIOPSY Left 07/26/2020   Procedure: LEFT BREAST  LUMPECTOMY WITH RADIOACTIVE SEED AND SENTINEL LYMPH NODE BIOPSY;  Surgeon: Vernetta Berg, MD;  Location: [facility];  Service: General;  Laterality: Left;   CESAREAN SECTION  1986   COLONOSCOPY     DIAGNOSTIC LAPAROSCOPY  1985   HARDWARE REMOVAL Left 04/30/2022   Procedure: HARDWARE REMOVAL LEFT FEMUR;  Surgeon: Fidel Rogue, MD;  Location: [facility];  Service: Orthopedics;  Laterality: Left;  150   HARDWARE REMOVAL Left 11/04/2022   Procedure: HARDWARE REMOVAL INFECTED LEFT TOTAL KNEE ARTHROPLASTY;  Surgeon: Harden Jerona GAILS, MD;  Location: [facility] OR;   Service: Orthopedics;  Laterality: Left;   I & D KNEE WITH POLY EXCHANGE Left 06/07/2022   Procedure: IRRIGATION AND DEBRIDEMENT KNEE WITH POLY EXCHANGE;  Surgeon: Fidel Rogue, MD;  Location: [facility];  Service: Orthopedics;  Laterality: Left;   INCISION AND DRAINAGE OF WOUND Left 06/04/2022   Procedure: IRRIGATION AND DEBRIDEMENT KNEE;  Surgeon: Fidel Rogue, MD;  Location: [facility];  Service: Orthopedics;  Laterality: Left;  60   INCISION AND DRAINAGE OF WOUND Left 06/15/2022   Procedure: excision of knee wound with Myriad;  Surgeon: Lowery Estefana RAMAN, DO;  Location: [facility];  Service: Plastics;  Laterality: Left;   ORIF FEMUR FRACTURE Left 04/10/2021   Procedure: OPEN REDUCTION INTERNAL FIXATION (ORIF) DISTAL FEMUR FRACTURE;  Surgeon: Celena Sharper, MD;  Location: [facility] OR;  Service: Orthopedics;  Laterality: Left;   PARTIAL KNEE ARTHROPLASTY Left 04/30/2022   Procedure: DISTAL FEMUR REPLACEMENT;  Surgeon: Fidel Rogue, MD;  Location: [facility];  Service: Orthopedics;  Laterality: Left;  150   Social History:  reports that she has quit smoking. Her smoking use included cigarettes. She has a 60 pack-year smoking history. She has never used smokeless tobacco. She reports that she does not currently use alcohol . She reports that she does not use drugs.  Allergies  Allergen Reactions   Lisinopril Cough    Family History  Problem Relation Age of Onset   Breast cancer Mother    Other Mother        blood clots   Diabetes Sister    Neuropathy Sister        diabetic neuropathy   Breast cancer Maternal Aunt        80   Colon cancer Maternal Aunt 53   Diabetes Paternal Aunt    Diabetes Brother    Neuropathy Brother        diabetic neuropathy   Breast cancer Other        PGM's sister; dx mid 54s   Colon cancer Other        PGM's brother; dx 87s   Ovarian cancer Other        PGM's mother; dx unknown age   Esophageal cancer Neg Hx    Rectal cancer Neg Hx    Stomach cancer Neg Hx     Colon polyps Neg Hx     Prior to Admission medications   Medication Sig Start Date End Date Taking? Authorizing Provider  acetaminophen  (TYLENOL ) 325 MG tablet Take 2 tablets (650 mg total) by mouth every 6 (six) hours as needed. 11/23/22   Setzer, Nena PARAS, PA-C  amLODipine  (NORVASC ) 10 MG tablet Take 1 tablet (10 mg total) by mouth daily. 04/22/21   Setzer, Sandra J, PA-C  amoxicillin -clavulanate (AUGMENTIN ) 875-125 MG tablet Take 1 tablet by mouth 2 (two) times daily. 12/10/22   Vu, Constance T, MD  anastrozole  (ARIMIDEX ) 1 MG tablet Take 1 tablet (1 mg total)  by mouth daily. 12/11/22   Lanny Callander, MD  apixaban  (ELIQUIS ) 5 MG TABS tablet Take 1 tablet (5 mg total) by mouth 2 (two) times daily. 11/23/22   Setzer, Sandra J, PA-C  Ascorbic Acid  (VITAMIN C ) 1000 MG tablet Take 1,000 mg by mouth daily.    [provider]  buPROPion  (WELLBUTRIN  SR) 150 MG 12 hr tablet Take 150 mg by mouth 2 (two) times daily. 10/04/21   [provider]  calcium  carbonate (OS-CAL - DOSED IN MG OF ELEMENTAL CALCIUM ) 1250 (500 Ca) MG tablet Take 1 tablet by mouth daily.    [provider]  cefTRIAXone  (ROCEPHIN ) 2 g injection  11/23/22   [provider]  Cyanocobalamin  (VITAMIN B-12 PO) Take 2,000 mcg by mouth daily.    [provider]  doxycycline  (VIBRA -TABS) 100 MG tablet Take 1 tablet (100 mg total) by mouth 2 (two) times daily. 12/10/22   Vu, Constance T, MD  hydrALAZINE  (APRESOLINE ) 25 MG tablet Take 3 tablets (75 mg total) by mouth every 8 (eight) hours. 11/23/22   Setzer, Sandra J, PA-C  melatonin 5 MG TABS Take 1 tablet (5 mg total) by mouth at bedtime. 11/23/22   Setzer, Sandra J, PA-C  Menthol , Topical Analgesic, (BENGAY EX) Apply 1 Application topically daily as needed (pain).    [provider]  pantoprazole  (PROTONIX ) 40 MG tablet Take 1 tablet (40 mg total) by mouth daily. 04/22/21   Setzer, Sandra J, PA-C  potassium chloride  (KLOR-CON ) 10 MEQ tablet Take 10 mEq  by mouth daily. 04/07/22   [provider]  pregabalin  (LYRICA ) 150 MG capsule Take 1 capsule (150 mg total) by mouth 2 (two) times daily. 12/25/22   Urbano Albright, MD  Prenatal Vit-Fe Fumarate-FA (PRENATAL PO) Take 1 tablet by mouth daily.    [provider]  promethazine  (PHENERGAN ) 25 MG tablet Take 1 tablet (25 mg total) by mouth every 6 (six) hours as needed for nausea or vomiting. 12/23/22   Vu, Constance DASEN, MD  senna (SENNA-TIME) 8.6 MG tablet     [provider]  zinc  gluconate 50 MG tablet Take 50 mg by mouth daily.    [provider]    Physical Exam: Vitals:   02/19/23 1155  BP: (!) 149/107  Pulse: 65  Resp: 19  Temp: 99 F (37.2 C)  TempSrc: Oral  SpO2: 100%   Physical Exam Vitals and nursing note reviewed.  Constitutional:      General: She is awake. She is not in acute distress.    Appearance: She is well-developed. She is ill-appearing.  HENT:     Head: Normocephalic.     Nose: No rhinorrhea.     Mouth/Throat:     Mouth: Mucous membranes are moist.  Eyes:     General: No scleral icterus.    Pupils: Pupils are equal, round, and reactive to light.  Neck:     Vascular: No JVD.  Cardiovascular:     Rate and Rhythm: Normal rate and regular rhythm.     Heart sounds: S1 normal and S2 normal.  Pulmonary:     Effort: Pulmonary effort is normal.     Breath sounds: Normal breath sounds. No wheezing, rhonchi or rales.  Abdominal:     General: Bowel sounds are normal. There is no distension.     Palpations: Abdomen is soft.     Tenderness: There is abdominal tenderness in the epigastric area. There is no right CVA tenderness, left CVA tenderness, guarding or  rebound.  Musculoskeletal:     Cervical back: Neck supple.     Right lower leg: No edema.     Left lower leg: No edema.  Skin:    General: Skin is warm and dry.     Coloration: Skin is pale.  Neurological:     General: No focal deficit present.     Mental Status: She is  alert and oriented to person, place, and time.  Psychiatric:        Mood and Affect: Mood normal.        Behavior: Behavior normal. Behavior is cooperative.    Data Reviewed:  Results are pending, will review when available.  EKG: Vent. rate 63 BPM PR interval 148 ms QRS duration 96 ms QT/QTcB 416/426 ms P-R-T axes 57 39 54 Sinus rhythm Low voltage, precordial leads RSR' in V1 or V2, right VCD or RVH  Assessment and Plan: Principal Problem:   Gastrointestinal hemorrhage with Tammy Admit to stepdown/inpatient. Clear liquid diet. Keep NPO after midnight. Continue IV fluids. Continue pantoprazole  twice daily. Monitor hematocrit and hemoglobin. Transfuse as needed. GI consult has been requested.  Active Problems:   Hypertension Hold amlodipine  today. Resume tomorrow if blood pressures are not soft.    Hypokalemia Replacing. Check phosphorus and magnesium  level.    Chronic hepatitis C with cirrhosis (HCC) Treated in the past. Monitor LFTs.    Bipolar disorder, unspecified (HCC) Continue bupropion  150 mg p.o. twice daily.    Chronic pain syndrome   S/P AKA (above knee amputation) unilateral, left (HCC) Continue gabapentin  300 mg p.o. at bedtime. Continue pregabalin  150 mg p.o. twice daily. Continue hydrocodone  10-325 mg every 4 hours as needed.    History of CVA (cerebrovascular accident) Supportive care as needed.     Advance Care Planning:   Code Status: Full Code   Consults: [facility].  Family Communication:   Severity of Illness: The appropriate patient status for this patient is INPATIENT. Inpatient status is judged to be reasonable and necessary in order to provide the required intensity of service to ensure the patient's safety. The patient's presenting symptoms, physical exam findings, and initial radiographic and laboratory data in the context of their chronic comorbidities is felt to place them at high risk for further clinical deterioration.  Furthermore, it is not anticipated that the patient will be medically stable for discharge from the hospital within 2 midnights of admission.   * I certify that at the point of admission it is my clinical judgment that the patient will require inpatient hospital care spanning beyond 2 midnights from the point of admission due to high intensity of service, high risk for further deterioration and high frequency of surveillance required.*  Author: Alm Dorn Castor, MD 02/19/2023 2:52 PM  For on call review [email or web address].   This document was prepared using Dragon voice recognition software and may contain some unintended transcription errors.

## 2023-02-19 NOTE — ED Provider Notes (Signed)
 I provided a substantive portion of the care of this patient.  I personally made/approved the management plan for this patient and take responsibility for the patient management.  EKG Interpretation Date/Time:  Friday February 19 2023 11:55:13 EST Ventricular Rate:  63 PR Interval:  148 QRS Duration:  96 QT Interval:  416 QTC Calculation: 426 R Axis:   39  Text Interpretation: Sinus rhythm Low voltage, precordial leads RSR' in V1 or V2, right VCD or RVH No significant change since last tracing Confirmed by Tyrea Froberg [phone]) on 02/19/2023 1:13:57 PM  Patient seen by me along with physician assistant.  Ms. Castronova talks about a 7-day history of of some epigastric abdominal pain with coffee-ground emesis.  Was coffee-ground emesis start with.  Also dark stools.  Patient states her still some abdominal discomfort was markedly improved where it was yesterday and has been no vomiting today and says her stools are back to normal color.  Temp here 99 blood pressure 149/107 oxygen sats 100% pulse 65 respirations 19.  Patient is on Eliquis  she is got a high amputation to the left extremity.  Apparently had a high DVT.  She did stop her Eliquis  on her own about 5 days ago because of the coffee-ground emesis.  Patient denies any red blood or any red blood in the stools.  Patient's labs here are notable for potassium 3.0 that because she given some oral potassium supplement.  Hemoglobin is 8.1 platelets 259.  Type and screen is pending.  EKG without anything acute.  Hemoccult pending.  Had discussion with patient if Hemoccult is positive feel that patient best admitted for observation.  If negative we could have a discussion for close follow-up.  Patient nontoxic no acute distress.   Nichelle Renwick, MD 02/19/23 [phone]

## 2023-02-19 NOTE — ED Notes (Signed)
 [place] PIV placed.

## 2023-02-19 NOTE — ED Notes (Signed)
 ..ED TO INPATIENT HANDOFF REPORT  ED Nurse Name and Phone #: Selinda Lang RAMAN Name/Age/Gender Tammy [name] 68 y.o. female Room/Bed: WA20/WA20  Code Status   Code Status: Full Code  Home/SNF/Other Home Patient oriented to: self, place, time, and situation Is this baseline? Yes   Triage Complete: Triage complete  Chief Complaint Gastrointestinal hemorrhage with melena [K92.1]  Triage Note PT arrives via POV. Pt reports she has experienced coffee ground emesis, dark stools, abdominal discomfort, and intermittent sob for the past week. Pt reports she takes eliquis  but stopped taking it when she first had coffee ground emesis. Pt is AxOx4.   Allergies Allergies  Allergen Reactions   Lisinopril Cough    Level of Care/Admitting Diagnosis ED Disposition     ED Disposition  Admit   Condition  --   Comment  Hospital Area: [facility] [[id number]]  Level of Care: Stepdown [14]  Admit to SDU based on following criteria: Hemodynamic compromise or significant risk of instability:  Patient requiring short term acute titration and management of vasoactive drips, and invasive monitoring (i.e., CVP and Arterial line).  May admit patient to [facility] or [facility] if equivalent level of care is available:: No  Covid Evaluation: Asymptomatic - no recent exposure (last 10 days) testing not required  Diagnosis: Gastrointestinal hemorrhage with melena [8675235]  Admitting Physician: CELINDA ALM LOT [[id number]]  Attending Physician: CELINDA ALM LOT [[id number]]  Certification:: I certify this patient will need inpatient services for at least 2 midnights  Expected Medical Readiness: 02/22/2023          B Medical/Surgery History Past Medical History:  Diagnosis Date   Anemia    hx of   Anxiety    on meds   Arthritis    back/bilateral ankles   Bipolar disorder (HCC)    Breast cancer (HCC) 2022   LEFT breast-radiation   Depression    on meds    Family history of breast cancer 07/10/2020   Family history of colon cancer 07/10/2020   Family history of ovarian cancer 07/10/2020   GERD (gastroesophageal reflux disease)    on meds   Heart murmur    dx by PCP   Hepatitis C    HTN (hypertension)    on meds   Hx of radiation therapy    Liver cirrhosis (HCC)    from hep c   Neuromuscular disorder (HCC)    Osteopenia    Personal history of radiation therapy    Stroke (HCC) 09/2012   slurred speech and LEFT sided weakness/notes 10/04/2012   Past Surgical History:  Procedure Laterality Date   AMPUTATION Left 11/04/2022   Procedure: LEFT ABOVE KNEE AMPUTATION;  Surgeon: Harden Jerona GAILS, MD;  Location: [facility] OR;  Service: Orthopedics;  Laterality: Left;   ANKLE FRACTURE SURGERY Left 2018   APPLICATION OF WOUND VAC Left 06/15/2022   Procedure: APPLICATION OF WOUND VAC;  Surgeon: Lowery Estefana RAMAN, DO;  Location: [facility] ORS;  Service: Plastics;  Laterality: Left;   APPLICATION OF WOUND VAC Left 09/14/2022   Procedure: APPLICATION OF WOUND VAC;  Surgeon: Lowery Estefana RAMAN, DO;  Location: [facility] OR;  Service: Plastics;  Laterality: Left;   BREAST LUMPECTOMY Left 07/2020   BREAST LUMPECTOMY WITH RADIOACTIVE SEED AND SENTINEL LYMPH NODE BIOPSY Left 07/26/2020   Procedure: LEFT BREAST LUMPECTOMY WITH RADIOACTIVE SEED AND SENTINEL LYMPH NODE BIOPSY;  Surgeon: Vernetta Berg, MD;  Location: [facility];  Service: General;  Laterality: Left;  CESAREAN SECTION  1986   COLONOSCOPY     DIAGNOSTIC LAPAROSCOPY  1985   HARDWARE REMOVAL Left 04/30/2022   Procedure: HARDWARE REMOVAL LEFT FEMUR;  Surgeon: Fidel Rogue, MD;  Location: [facility] ORS;  Service: Orthopedics;  Laterality: Left;  150   HARDWARE REMOVAL Left 11/04/2022   Procedure: HARDWARE REMOVAL INFECTED LEFT TOTAL KNEE ARTHROPLASTY;  Surgeon: Harden Jerona GAILS, MD;  Location: [facility] OR;  Service: Orthopedics;  Laterality: Left;   I & D KNEE WITH POLY EXCHANGE Left 06/07/2022    Procedure: IRRIGATION AND DEBRIDEMENT KNEE WITH POLY EXCHANGE;  Surgeon: Fidel Rogue, MD;  Location: [facility];  Service: Orthopedics;  Laterality: Left;   INCISION AND DRAINAGE OF WOUND Left 06/04/2022   Procedure: IRRIGATION AND DEBRIDEMENT KNEE;  Surgeon: Fidel Rogue, MD;  Location: [facility];  Service: Orthopedics;  Laterality: Left;  60   INCISION AND DRAINAGE OF WOUND Left 06/15/2022   Procedure: excision of knee wound with Myriad;  Surgeon: Lowery Estefana RAMAN, DO;  Location: [facility];  Service: Plastics;  Laterality: Left;   ORIF FEMUR FRACTURE Left 04/10/2021   Procedure: OPEN REDUCTION INTERNAL FIXATION (ORIF) DISTAL FEMUR FRACTURE;  Surgeon: Celena Sharper, MD;  Location: [facility] OR;  Service: Orthopedics;  Laterality: Left;   PARTIAL KNEE ARTHROPLASTY Left 04/30/2022   Procedure: DISTAL FEMUR REPLACEMENT;  Surgeon: Fidel Rogue, MD;  Location: [facility];  Service: Orthopedics;  Laterality: Left;  150     A IV Location/Drains/Wounds Patient Lines/Drains/Airways Status     Active Line/Drains/Airways     Name Placement date Placement time Site Days   Peripheral IV 02/19/23 22 G 2.5 Anterior;Right;Upper Arm 02/19/23  1630  Arm  less than 1            Intake/Output Last 24 hours No intake or output data in the 24 hours ending 02/19/23 2016  Labs/Imaging Results for orders placed or performed during the hospital encounter of 02/19/23 (from the past 48 hours)  Comprehensive metabolic panel     Status: Abnormal   Collection Time: 02/19/23 12:11 PM  Result Value Ref Range   Sodium 138 135 - 145 mmol/L   Potassium 3.0 (L) 3.5 - 5.1 mmol/L   Chloride 105 98 - 111 mmol/L   CO2 25 22 - 32 mmol/L   Glucose, Bld 101 (H) 70 - 99 mg/dL    Comment: Glucose reference range applies only to samples taken after fasting for at least 8 hours.   BUN 10 8 - 23 mg/dL   Creatinine, Ser 9.34 0.44 - 1.00 mg/dL   Calcium  9.1 8.9 - 10.3 mg/dL   Total Protein 7.4 6.5 - 8.1 g/dL   Albumin 3.2 (L)  3.5 - 5.0 g/dL   AST 15 15 - 41 U/L   ALT 11 0 - 44 U/L   Alkaline Phosphatase 76 38 - 126 U/L   Total Bilirubin <0.2 0.0 - 1.2 mg/dL   GFR, Estimated >39 >39 mL/min    Comment: (NOTE) Calculated using the CKD-EPI Creatinine Equation (2021)    Anion gap 8 5 - 15    Comment: Performed at [facility], [address], [place], [place] [address]  CBC     Status: Abnormal   Collection Time: 02/19/23 12:11 PM  Result Value Ref Range   WBC 7.1 4.0 - 10.5 K/uL   RBC 3.02 (L) 3.87 - 5.11 MIL/uL   Hemoglobin 8.1 (L) 12.0 - 15.0 g/dL   HCT 74.3 (L) 63.9 - 53.9 %  MCV 84.8 80.0 - 100.0 fL   MCH 26.8 26.0 - 34.0 pg   MCHC 31.6 30.0 - 36.0 g/dL   RDW 83.8 (H) 88.4 - 84.4 %   Platelets 259 150 - 400 K/uL   nRBC 0.0 0.0 - 0.2 %    Comment: Performed at [facility], [address], [place], [place] [address]  Type and screen [facility]     Status: None   Collection Time: 02/19/23 12:11 PM  Result Value Ref Range   ABO/RH(D) AB POS    Antibody Screen NEG    Sample Expiration      02/22/2023,2359 Performed at [facility], [address] [place], [place] [address]   POC occult blood, ED     Status: Abnormal   Collection Time: 02/19/23  2:20 PM  Result Value Ref Range   Fecal Occult Bld POSITIVE (A) NEGATIVE  Hemoglobin and hematocrit, blood     Status: Abnormal   Collection Time: 02/19/23  4:35 PM  Result Value Ref Range   Hemoglobin 8.9 (L) 12.0 - 15.0 g/dL   HCT 71.1 (L) 63.9 - 53.9 %    Comment: Performed at [facility], [address], [place], [place] [address]   No results found.  Pending Labs Unresulted Labs (From admission, onward)     Start     Ordered   02/20/23 0500  HIV Antibody (routine testing w rflx)  (HIV Antibody (Routine testing w reflex) panel)  Tomorrow morning,   R        02/19/23 1508   02/20/23 0500  CBC  Tomorrow morning,   R        02/19/23 1508   02/20/23  0500  Comprehensive metabolic panel  Tomorrow morning,   R        02/19/23 1508   02/19/23 1600  Hemoglobin and hematocrit, blood  Every 6 hours,   R (with TIMED occurrences)      02/19/23 1455   02/19/23 1453  Protime-INR  Add-on,   AD        02/19/23 1454   02/19/23 1453  Magnesium   Add-on,   AD        02/19/23 1454   02/19/23 1453  Phosphorus  Add-on,   AD        02/19/23 1454            Vitals/Pain Today's Vitals   02/19/23 1650 02/19/23 1823 02/19/23 1836 02/19/23 1958  BP:  (!) 165/75    Pulse:  75    Resp:  18    Temp:  98.3 F (36.8 C)    TempSrc:  Oral    SpO2:  100%    PainSc: 7  5  7  3      Isolation Precautions No active isolations  Medications Medications  0.9 % NaCl with KCl 20 mEq/ L  infusion (has no administration in time range)  ondansetron  (ZOFRAN ) tablet 4 mg (has no administration in time range)    Or  ondansetron  (ZOFRAN ) injection 4 mg (has no administration in time range)  acetaminophen  (TYLENOL ) tablet 500 mg (500 mg Oral Given 02/19/23 1640)    Or  acetaminophen  (TYLENOL ) suppository 475 mg ( Rectal See Alternative 02/19/23 1640)  amLODipine  (NORVASC ) tablet 10 mg (has no administration in time range)  anastrozole  (ARIMIDEX ) tablet 1 mg (has no administration in time range)  amoxicillin  (AMOXIL ) capsule 500 mg (500 mg Oral Given 02/19/23 1839)  buPROPion  (WELLBUTRIN  SR) 12 hr tablet 150 mg (has no administration in time range)  gabapentin  (NEURONTIN ) capsule 300 mg (has no administration in time range)  HYDROcodone -acetaminophen  (NORCO) 10-325 MG per tablet 1 tablet (1 tablet Oral Given 02/19/23 1837)  pregabalin  (LYRICA ) capsule 150 mg (has no administration in time range)  pantoprazole  (PROTONIX ) injection 40 mg (40 mg Intravenous Given 02/19/23 1357)  potassium chloride  SA (KLOR-CON  M) CR tablet 40 mEq (40 mEq Oral Given 02/19/23 1355)  pantoprazole  (PROTONIX ) injection 40 mg (40 mg Intravenous Given 02/19/23 1631)    Mobility walks with  person assist     Focused Assessments   R Recommendations: See Admitting Provider Note  Report given to:   Additional Notes:

## 2023-02-19 NOTE — ED Provider Notes (Signed)
 [place] [facility] Provider Note   CSN: [id number] Arrival date & time: 02/19/23  1146     History  Chief Complaint  Patient presents with   Shortness of Breath   Hematemesis   GI Bleeding    Tammy [name] is a 68 y.o. female with PMHx stroke, anxiety, arthritis, bipolar disorder, depression, GERD, HepC, HTN, liver cirrhosis, breast cancer who presents to the ED concerned for dark stools and coffee ground emesis  over the past week. Patient stopped taking her Eliquis  on Sunday to help resolve these symptoms. Patient endorsing epigastric abdominal pain over the past week but states that it feels a lot better today - currently 3/10 severity. Patient also stating that she felt severe generalized weakness/fatigue earlier this week that has since resolved - patient stating that she feels a lot better today. Last episode of vomiting yesterday. Patient also endorses BM this morning that was no longer dark.  Patient met with her PCP today who referred patient to ED.  Patient's only symptom currently is mild body aches. Patient also stating that she is hungry.  Denies fever, chest pain, dyspnea, cough, nausea, vomiting, diarrhea, dysuria, hematuria, hematochezia.    Shortness of Breath      Home Medications Prior to Admission medications   Medication Sig Start Date End Date Taking? Authorizing Provider  acetaminophen  (TYLENOL ) 325 MG tablet Take 2 tablets (650 mg total) by mouth every 6 (six) hours as needed. 11/23/22   Setzer, Nena PARAS, PA-C  amLODipine  (NORVASC ) 10 MG tablet Take 1 tablet (10 mg total) by mouth daily. 04/22/21   Setzer, Sandra J, PA-C  amoxicillin -clavulanate (AUGMENTIN ) 875-125 MG tablet Take 1 tablet by mouth 2 (two) times daily. 12/10/22   Vu, Constance T, MD  anastrozole  (ARIMIDEX ) 1 MG tablet Take 1 tablet (1 mg total) by mouth daily. 12/11/22   Lanny Callander, MD  apixaban  (ELIQUIS ) 5 MG TABS tablet Take 1 tablet (5 mg  total) by mouth 2 (two) times daily. 11/23/22   Setzer, Sandra J, PA-C  Ascorbic Acid  (VITAMIN C ) 1000 MG tablet Take 1,000 mg by mouth daily.    [provider]  buPROPion  (WELLBUTRIN  SR) 150 MG 12 hr tablet Take 150 mg by mouth 2 (two) times daily. 10/04/21   [provider]  calcium  carbonate (OS-CAL - DOSED IN MG OF ELEMENTAL CALCIUM ) 1250 (500 Ca) MG tablet Take 1 tablet by mouth daily.    [provider]  cefTRIAXone  (ROCEPHIN ) 2 g injection  11/23/22   [provider]  Cyanocobalamin  (VITAMIN B-12 PO) Take 2,000 mcg by mouth daily.    [provider]  doxycycline  (VIBRA -TABS) 100 MG tablet Take 1 tablet (100 mg total) by mouth 2 (two) times daily. 12/10/22   Vu, Constance T, MD  hydrALAZINE  (APRESOLINE ) 25 MG tablet Take 3 tablets (75 mg total) by mouth every 8 (eight) hours. 11/23/22   Setzer, Sandra J, PA-C  melatonin 5 MG TABS Take 1 tablet (5 mg total) by mouth at bedtime. 11/23/22   Setzer, Sandra J, PA-C  Menthol , Topical Analgesic, (BENGAY EX) Apply 1 Application topically daily as needed (pain).    [provider]  pantoprazole  (PROTONIX ) 40 MG tablet Take 1 tablet (40 mg total) by mouth daily. 04/22/21   Setzer, Sandra J, PA-C  potassium chloride  (KLOR-CON ) 10 MEQ tablet Take 10 mEq by mouth daily. 04/07/22   [provider]  pregabalin  (LYRICA ) 150 MG capsule Take 1 capsule (150 mg total)  by mouth 2 (two) times daily. 12/25/22   Urbano Albright, MD  Prenatal Vit-Fe Fumarate-FA (PRENATAL PO) Take 1 tablet by mouth daily.    [provider]  promethazine  (PHENERGAN ) 25 MG tablet Take 1 tablet (25 mg total) by mouth every 6 (six) hours as needed for nausea or vomiting. 12/23/22   Vu, Constance DASEN, MD  senna (SENNA-TIME) 8.6 MG tablet     [provider]  zinc  gluconate 50 MG tablet Take 50 mg by mouth daily.    [provider]      Allergies    Lisinopril    Review of Systems   Review of Systems   Gastrointestinal:  Positive for blood in stool.    Physical Exam Updated Vital Signs BP (!) 149/107   Pulse 65   Temp 99 F (37.2 C) (Oral)   Resp 19   SpO2 100%  Physical Exam Vitals and nursing note reviewed.  Constitutional:      General: She is not in acute distress.    Appearance: She is not ill-appearing or toxic-appearing.  HENT:     Head: Normocephalic and atraumatic.     Mouth/Throat:     Mouth: Mucous membranes are moist.     Pharynx: No oropharyngeal exudate or posterior oropharyngeal erythema.  Eyes:     General: No scleral icterus.       Right eye: No discharge.        Left eye: No discharge.     Conjunctiva/sclera: Conjunctivae normal.  Cardiovascular:     Rate and Rhythm: Normal rate and regular rhythm.     Pulses: Normal pulses.     Heart sounds: Normal heart sounds. No murmur heard. Pulmonary:     Effort: Pulmonary effort is normal. No respiratory distress.     Breath sounds: Normal breath sounds. No wheezing, rhonchi or rales.  Abdominal:     General: Bowel sounds are normal.     Palpations: Abdomen is soft. There is no mass.     Comments: Mild epigastric tenderness to palpation  Musculoskeletal:     Right lower leg: No edema.     Left lower leg: No edema.  Skin:    General: Skin is warm and dry.     Findings: No rash.  Neurological:     General: No focal deficit present.     Mental Status: She is alert and oriented to person, place, and time. Mental status is at baseline.  Psychiatric:        Mood and Affect: Mood normal.        Behavior: Behavior normal.     ED Results / Procedures / Treatments   Labs (all labs ordered are listed, but only abnormal results are displayed) Labs Reviewed  CBC - Abnormal; Notable for the following components:      Result Value   RBC 3.02 (*)    Hemoglobin 8.1 (*)    HCT 25.6 (*)    RDW 16.1 (*)    All other components within normal limits  COMPREHENSIVE METABOLIC PANEL  POC OCCULT BLOOD, ED  TYPE AND  SCREEN    EKG None  Radiology No results found.  Procedures Procedures    Medications Ordered in ED Medications - No data to display  ED Course/ Medical Decision Making/ A&P                                 Medical  Decision Making Amount and/or Complexity of Data Reviewed Labs: ordered.   This patient presents to the ED for concern of coffee-ground emesis and dark stools, this involves an extensive number of treatment options, and is a complaint that carries with it a high risk of complications and morbidity.  The differential diagnosis includes chron's disease, IBS, lower GI bleeding/hemorrhage, hemorrhoid, thrombosed hemorrhoid, anal fissure   Co morbidities that complicate the patient evaluation  stroke, anxiety, arthritis, bipolar disorder, depression, GERD, HepC, HTN, liver cirrhosis   Additional history obtained:  Additional history obtained from 12/2021 Colonoscopy note:  The perianal and digital rectal examinations were normal.  A 6 mm polyp was found in the ascending colon. The polyp was sessile. The polyp was removed with a cold snare. Resection and retrieval were complete. Estimated blood loss was minimal.  A 5 mm polyp was found in the descending colon. The polyp was sessile. The polyp was removed with a cold snare. Resection and retrieval were complete. Estimated blood loss was minimal.  Seven sessile polyps were found in the rectum and distal sigmoid colon. The polyps were 2 to 4 mm in size. These polyps were removed with a cold snare. Resection and retrieval were complete. Estimated blood loss was minimal.  Multiple medium- mouthed and small- mouthed diverticula were found in the ascending colon.  The retroflexed view of the distal rectum and anal verge was normal and showed no anal or rectal abnormalities.   Consultations Obtained:  I requested consultation with the GI on-call Dr. Burnette,  and discussed lab and imaging findings as well as pertinent plan -  they will follow with patient.   Problem List / ED Course / Critical interventions / Medication management  Patient presents to the ED concerned for coffee-ground emesis and dark stools over the past week. Patient stopped taking her Eliquis  on Sunday to resolve her symptoms. Patient also had epigastric pain and severe fatigue that has since resolved. The vomiting and dark stools also resolved today. Patient's PCP referred her to the ED. Patient stating that she currently feels a lot better.   Physical exam with mild epigastric tenderness to palpation.  DRE without obvious blood.  Patient afebrile with stable vitals. I Ordered, and personally interpreted labs.  CMP with hypokalemia 3.0.  CBC with anemia 8.1 which is downtrending from her last value of 10.0 two months ago.  Occult blood positive.  EKG reassuring. Patient tolerating PO intake. Provided patient with Protonix  IV.  Also provided patient with oral potassium for her mild hypokalemia. Dr. Celinda admitting provider. I have reviewed the patients home medicines and have made adjustments as needed.  DDx: these are considered less likely d/t history of present illness and physical exam: -chron's disease/IBS: denies hx of conditions; no abdominal pain; symptoms are acute -lower GI bleeding/hemorrhage: DRE without obvious blood; Occult negative; no abdominal pain -thrombosed hemorrhoid: physical exam reassuring -anal fissure: denies rectal pain; physical exam reassuring   Social Determinants of Health:  none           Final Clinical Impression(s) / ED Diagnoses Final diagnoses:  None    Rx / DC Orders ED Discharge Orders     None         Hoy Nidia FALCON, [place] 02/19/23 1534    Geraldene Hamilton, MD 02/20/23 [id number]

## 2023-02-19 NOTE — ED Notes (Signed)
 Admitting provider at bedside.

## 2023-02-20 DIAGNOSIS — I10 Essential (primary) hypertension: Secondary | ICD-10-CM | POA: Diagnosis not present

## 2023-02-20 DIAGNOSIS — E876 Hypokalemia: Secondary | ICD-10-CM

## 2023-02-20 DIAGNOSIS — K7460 Unspecified cirrhosis of liver: Secondary | ICD-10-CM

## 2023-02-20 DIAGNOSIS — B182 Chronic viral hepatitis C: Secondary | ICD-10-CM

## 2023-02-20 DIAGNOSIS — Z7901 Long term (current) use of anticoagulants: Secondary | ICD-10-CM

## 2023-02-20 DIAGNOSIS — Z8619 Personal history of other infectious and parasitic diseases: Secondary | ICD-10-CM

## 2023-02-20 DIAGNOSIS — I82412 Acute embolism and thrombosis of left femoral vein: Secondary | ICD-10-CM | POA: Insufficient documentation

## 2023-02-20 DIAGNOSIS — K922 Gastrointestinal hemorrhage, unspecified: Secondary | ICD-10-CM

## 2023-02-20 DIAGNOSIS — Z170 Estrogen receptor positive status [ER+]: Secondary | ICD-10-CM

## 2023-02-20 DIAGNOSIS — K219 Gastro-esophageal reflux disease without esophagitis: Secondary | ICD-10-CM

## 2023-02-20 DIAGNOSIS — K920 Hematemesis: Secondary | ICD-10-CM

## 2023-02-20 DIAGNOSIS — K921 Melena: Secondary | ICD-10-CM | POA: Diagnosis not present

## 2023-02-20 DIAGNOSIS — K2100 Gastro-esophageal reflux disease with esophagitis, without bleeding: Secondary | ICD-10-CM

## 2023-02-20 DIAGNOSIS — C50412 Malignant neoplasm of upper-outer quadrant of left female breast: Secondary | ICD-10-CM

## 2023-02-20 LAB — IRON AND TIBC
Iron: 22 ug/dL — ABNORMAL LOW (ref 28–170)
Saturation Ratios: 6 % — ABNORMAL LOW (ref 10.4–31.8)
TIBC: 391 ug/dL (ref 250–450)
UIBC: 369 ug/dL

## 2023-02-20 LAB — COMPREHENSIVE METABOLIC PANEL
ALT: 9 U/L (ref 0–44)
AST: 13 U/L — ABNORMAL LOW (ref 15–41)
Albumin: 2.9 g/dL — ABNORMAL LOW (ref 3.5–5.0)
Alkaline Phosphatase: 65 U/L (ref 38–126)
Anion gap: 10 (ref 5–15)
BUN: 8 mg/dL (ref 8–23)
CO2: 24 mmol/L (ref 22–32)
Calcium: 9 mg/dL (ref 8.9–10.3)
Chloride: 108 mmol/L (ref 98–111)
Creatinine, Ser: 0.49 mg/dL (ref 0.44–1.00)
GFR, Estimated: >60 mL/min (ref >60)
Glucose, Bld: 94 mg/dL (ref 70–99)
Potassium: 3 mmol/L — ABNORMAL LOW (ref 3.5–5.1)
Sodium: 142 mmol/L (ref 135–145)
Total Bilirubin: 0.4 mg/dL (ref 0.0–1.2)
Total Protein: 6.6 g/dL (ref 6.5–8.1)

## 2023-02-20 LAB — CBC
HCT: 23.6 % — ABNORMAL LOW (ref 36.0–46.0)
HCT: 20.3 % — ABNORMAL LOW (ref 36.0–46.0)
Hemoglobin: 7.4 g/dL — ABNORMAL LOW (ref 12.0–15.0)
Hemoglobin: 6.3 g/dL — CL (ref 12.0–15.0)
MCH: 25.8 pg — ABNORMAL LOW (ref 26.0–34.0)
MCH: 25.9 pg — ABNORMAL LOW (ref 26.0–34.0)
MCHC: 31.4 g/dL (ref 30.0–36.0)
MCHC: 31 g/dL (ref 30.0–36.0)
MCV: 82.2 fL (ref 80.0–100.0)
MCV: 83.5 fL (ref 80.0–100.0)
Platelets: 230 10*3/uL (ref 150–400)
Platelets: 293 10*3/uL (ref 150–400)
RBC: 2.87 MIL/uL — ABNORMAL LOW (ref 3.87–5.11)
RBC: 2.43 MIL/uL — ABNORMAL LOW (ref 3.87–5.11)
RDW: 15.9 % — ABNORMAL HIGH (ref 11.5–15.5)
RDW: 16 % — ABNORMAL HIGH (ref 11.5–15.5)
WBC: 5.2 10*3/uL (ref 4.0–10.5)
WBC: 6.9 10*3/uL (ref 4.0–10.5)
nRBC: 0 % (ref 0.0–0.2)
nRBC: 0 % (ref 0.0–0.2)

## 2023-02-20 LAB — FOLATE: Folate: 14.8 ng/mL (ref >5.9)

## 2023-02-20 LAB — HIV ANTIBODY (ROUTINE TESTING W REFLEX): HIV Screen 4th Generation wRfx: NONREACTIVE

## 2023-02-20 LAB — HEMOGLOBIN AND HEMATOCRIT, BLOOD
HCT: 34.5 % — ABNORMAL LOW (ref 36.0–46.0)
Hemoglobin: 11.6 g/dL — ABNORMAL LOW (ref 12.0–15.0)

## 2023-02-20 LAB — FERRITIN: Ferritin: 19 ng/mL (ref 11–307)

## 2023-02-20 LAB — PREPARE RBC (CROSSMATCH)

## 2023-02-20 LAB — MAGNESIUM: Magnesium: 1.7 mg/dL (ref 1.7–2.4)

## 2023-02-20 LAB — VITAMIN B12: Vitamin B-12: 547 pg/mL (ref 180–914)

## 2023-02-20 MED ORDER — CHLORHEXIDINE GLUCONATE CLOTH 2 % EX PADS
6.0000 | MEDICATED_PAD | Freq: Every day | CUTANEOUS | Status: DC
  Administered 2023-02-21: 6 via TOPICAL

## 2023-02-20 MED ORDER — ORAL CARE MOUTH RINSE: 15.0000 mL | OROMUCOSAL | Status: DC | PRN

## 2023-02-20 MED ORDER — SODIUM CHLORIDE 0.9% IV SOLUTION: Freq: Once | INTRAVENOUS | Status: AC

## 2023-02-20 MED ORDER — POTASSIUM CHLORIDE CRYS ER 20 MEQ PO TBCR: 40.0000 meq | EXTENDED_RELEASE_TABLET | Freq: Once | ORAL | Status: DC

## 2023-02-20 MED ORDER — HYDRALAZINE HCL 25 MG PO TABS
25.0000 mg | ORAL_TABLET | Freq: Two times a day (BID) | ORAL | Status: DC
  Administered 2023-02-20 – 2023-02-21 (×2): 25 mg via ORAL
  Filled 2023-02-20 (×2): qty 1

## 2023-02-20 MED ORDER — AMOXICILLIN 500 MG PO CAPS
1000.0000 mg | ORAL_CAPSULE | Freq: Two times a day (BID) | ORAL | Status: DC
  Administered 2023-02-20 (×2): 1000 mg via ORAL
  Filled 2023-02-20 (×4): qty 2

## 2023-02-20 MED ORDER — PROPOFOL 500 MG/50ML IV EMUL
INTRAVENOUS | Status: AC
  Filled 2023-02-20: qty 50

## 2023-02-20 MED ORDER — AMLODIPINE BESYLATE 10 MG PO TABS
10.0000 mg | ORAL_TABLET | Freq: Every day | ORAL | Status: DC
  Administered 2023-02-20 – 2023-02-21 (×2): 10 mg via ORAL
  Filled 2023-02-20 (×3): qty 1

## 2023-02-20 MED ORDER — LOSARTAN POTASSIUM 50 MG PO TABS
50.0000 mg | ORAL_TABLET | Freq: Every day | ORAL | Status: DC
  Administered 2023-02-20 – 2023-02-21 (×2): 50 mg via ORAL
  Filled 2023-02-20 (×2): qty 1

## 2023-02-20 MED ORDER — LACTATED RINGERS IV SOLN: INTRAVENOUS | Status: DC

## 2023-02-20 MED ORDER — PNEUMOCOCCAL 20-VAL CONJ VACC 0.5 ML IM SUSY: 0.5000 mL | PREFILLED_SYRINGE | INTRAMUSCULAR | Status: DC | PRN

## 2023-02-20 MED ORDER — PANTOPRAZOLE SODIUM 40 MG IV SOLR
40.0000 mg | Freq: Two times a day (BID) | INTRAVENOUS | Status: DC
  Administered 2023-02-20 – 2023-02-21 (×3): 40 mg via INTRAVENOUS
  Filled 2023-02-20 (×3): qty 10

## 2023-02-20 MED ORDER — DIPHENHYDRAMINE HCL 25 MG PO CAPS
25.0000 mg | ORAL_CAPSULE | Freq: Every evening | ORAL | Status: DC | PRN
  Administered 2023-02-20: 25 mg via ORAL
  Filled 2023-02-20: qty 1

## 2023-02-20 MED ORDER — POTASSIUM CHLORIDE CRYS ER 20 MEQ PO TBCR
40.0000 meq | EXTENDED_RELEASE_TABLET | ORAL | Status: AC
  Administered 2023-02-20 (×2): 40 meq via ORAL
  Filled 2023-02-20 (×2): qty 2

## 2023-02-20 MED ORDER — HYDRALAZINE HCL 20 MG/ML IJ SOLN
10.0000 mg | Freq: Four times a day (QID) | INTRAMUSCULAR | Status: DC | PRN
Start: 2023-02-20 — End: 2023-02-21
  Administered 2023-02-20 – 2023-02-21 (×2): 10 mg via INTRAVENOUS
  Filled 2023-02-20 (×2): qty 1

## 2023-02-20 MED ORDER — CHLORHEXIDINE GLUCONATE CLOTH 2 % EX PADS: 6.0000 | MEDICATED_PAD | Freq: Every day | CUTANEOUS | Status: DC

## 2023-02-20 MED ORDER — HYDRALAZINE HCL 20 MG/ML IJ SOLN
10.0000 mg | Freq: Four times a day (QID) | INTRAMUSCULAR | Status: AC | PRN
  Administered 2023-02-20: 10 mg via INTRAVENOUS
  Filled 2023-02-20: qty 1

## 2023-02-20 NOTE — Assessment & Plan Note (Signed)
Continue Arimidex 

## 2023-02-20 NOTE — Progress Notes (Signed)
 The amount of 425 for the intake total entered @ 1935 was a charting error by 350 mL. The total amount should only be 75 as the blood verification was a duplicate.

## 2023-02-20 NOTE — Assessment & Plan Note (Signed)
·   Replacing with potassium chloride °· Evaluating for concurrent hypomagnesemia  °· Monitoring potassium levels with serial chemistries. ° °

## 2023-02-20 NOTE — Assessment & Plan Note (Signed)
 Uncontrolled blood pressures, will add 50 mg of losartan daily.   Resume patients home regimen of amlodipine PRN intravenous antihypertensives for excessively elevated blood pressure

## 2023-02-20 NOTE — Assessment & Plan Note (Signed)
 On Protonix as above

## 2023-02-20 NOTE — H&P (View-Only) (Signed)
 [facility] CONSULTATION NOTE Referring: [facility] hospitalist Primary GI: Dr. San Reason for consultation: Coffee-ground emesis     HISTORY OF PRESENT ILLNESS:  Tammy [name] is a 68 y.o. female  with medical history significant of anemia, anxiety, osteoarthritis, bipolar disorder, left breast cancer, depression, GERD, hypertension, radiation therapy, osteopenia, history of CVA with residual left-sided hemiparesis, history of hep C liver cirrhosis who has mild epigastric abdominal pain, had 1 or 2 episodes of coffee-ground hematemesis/1 or 2 episodes of dark stools daily since Sunday.  She has had neither the past 2 days.  Vague abdominal discomfort.  She held her apixaban  on Sunday evening and has not taken it since then.  She has felt tired, occasionally dyspneic on exertion and mildly lightheaded.  To her knowledge, no history of PUD or gastritis.She has not been using NSAIDs, only Tylenol .  She does take pantoprazole  daily at home for GERD.  She underwent colonoscopy November 2023.  She was found to have multiple diminutive polyps which were adenomatous, sessile serrated, and hyperplastic.  Also right sided diverticulosis.  No prior upper endoscopy.  She is chronically anemic.  Baseline hemoglobins run in the 8-9 range.  Hemoglobin yesterday 7.4.  Hemoglobin today 7.4.  Comprehensive metabolic panel remarkable for potassium 3.0.  Prothrombin time and platelets normal.  She does have a history of hepatitis C with sustained virologic response and cure.  Followed by [facility] (last seen 12/10/2022).  Liver ultrasound showed irregularity of the liver.  Noninvasive testing for hepatic fibrosis found the patient unlikely to have significant portal hypertension.  EGD not felt warranted.    REVIEW OF SYSTEMS:  All non-GI ROS negative except for back pain  Past Medical History:  Diagnosis Date   Anemia    hx of   Anxiety    on meds   Arthritis    back/bilateral ankles    Bipolar disorder (HCC)    Breast cancer (HCC) 2022   LEFT breast-radiation   Depression    on meds   Family history of breast cancer 07/10/2020   Family history of colon cancer 07/10/2020   Family history of ovarian cancer 07/10/2020   GERD (gastroesophageal reflux disease)    on meds   Heart murmur    dx by PCP   Hepatitis C    HTN (hypertension)    on meds   Hx of radiation therapy    Liver cirrhosis (HCC)    from hep c   Neuromuscular disorder (HCC)    Osteopenia    Personal history of radiation therapy    Stroke (HCC) 09/2012   slurred speech and LEFT sided weakness/notes 10/04/2012    Past Surgical History:  Procedure Laterality Date   AMPUTATION Left 11/04/2022   Procedure: LEFT ABOVE KNEE AMPUTATION;  Surgeon: Harden Jerona GAILS, MD;  Location: [facility] OR;  Service: Orthopedics;  Laterality: Left;   ANKLE FRACTURE SURGERY Left 2018   APPLICATION OF WOUND VAC Left 06/15/2022   Procedure: APPLICATION OF WOUND VAC;  Surgeon: Lowery Estefana RAMAN, DO;  Location: [facility];  Service: Plastics;  Laterality: Left;   APPLICATION OF WOUND VAC Left 09/14/2022   Procedure: APPLICATION OF WOUND VAC;  Surgeon: Lowery Estefana RAMAN, DO;  Location: [facility] OR;  Service: Plastics;  Laterality: Left;   BREAST LUMPECTOMY Left 07/2020   BREAST LUMPECTOMY WITH RADIOACTIVE SEED AND SENTINEL LYMPH NODE BIOPSY Left 07/26/2020   Procedure: LEFT BREAST LUMPECTOMY WITH RADIOACTIVE SEED AND SENTINEL LYMPH NODE BIOPSY;  Surgeon: Vernetta Berg, MD;  Location: [facility];  Service: General;  Laterality: Left;   CESAREAN SECTION  1986   COLONOSCOPY     DIAGNOSTIC LAPAROSCOPY  1985   HARDWARE REMOVAL Left 04/30/2022   Procedure: HARDWARE REMOVAL LEFT FEMUR;  Surgeon: Fidel Rogue, MD;  Location: [facility];  Service: Orthopedics;  Laterality: Left;  150   HARDWARE REMOVAL Left 11/04/2022   Procedure: HARDWARE REMOVAL INFECTED LEFT TOTAL KNEE ARTHROPLASTY;  Surgeon: Harden Jerona GAILS, MD;  Location: [facility]  OR;  Service: Orthopedics;  Laterality: Left;   I & D KNEE WITH POLY EXCHANGE Left 06/07/2022   Procedure: IRRIGATION AND DEBRIDEMENT KNEE WITH POLY EXCHANGE;  Surgeon: Fidel Rogue, MD;  Location: [facility];  Service: Orthopedics;  Laterality: Left;   INCISION AND DRAINAGE OF WOUND Left 06/04/2022   Procedure: IRRIGATION AND DEBRIDEMENT KNEE;  Surgeon: Fidel Rogue, MD;  Location: [facility];  Service: Orthopedics;  Laterality: Left;  60   INCISION AND DRAINAGE OF WOUND Left 06/15/2022   Procedure: excision of knee wound with Myriad;  Surgeon: Lowery Estefana RAMAN, DO;  Location: [facility];  Service: Plastics;  Laterality: Left;   ORIF FEMUR FRACTURE Left 04/10/2021   Procedure: OPEN REDUCTION INTERNAL FIXATION (ORIF) DISTAL FEMUR FRACTURE;  Surgeon: Celena Sharper, MD;  Location: [facility] OR;  Service: Orthopedics;  Laterality: Left;   PARTIAL KNEE ARTHROPLASTY Left 04/30/2022   Procedure: DISTAL FEMUR REPLACEMENT;  Surgeon: Fidel Rogue, MD;  Location: [facility];  Service: Orthopedics;  Laterality: Left;  150    Social History Ronna Tianah Lonardo  reports that she has quit smoking. Her smoking use included cigarettes. She has a 60 pack-year smoking history. She has never used smokeless tobacco. She reports that she does not currently use alcohol . She reports that she does not use drugs.  family history includes Breast cancer in her maternal aunt, mother, and another family member; Colon cancer in an other family member; Colon cancer (age of onset: 69) in her maternal aunt; Diabetes in her brother, paternal aunt, and sister; Neuropathy in her brother and sister; Other in her mother; Ovarian cancer in an other family member.  Allergies  Allergen Reactions   Lisinopril Cough       PHYSICAL EXAMINATION: Vital signs: BP (!) 147/107   Pulse 73   Temp 98.6 F (37 C) (Oral)   Resp 20   Ht 5' 2 (1.575 m)   Wt 72.2 kg   SpO2 100%   BMI 29.11 kg/m   Constitutional: generally well-appearing,  no acute distress Psychiatric: alert and oriented x3, cooperative Eyes: extraocular movements intact, anicteric, conjunctiva pink Mouth: oral pharynx moist, no lesions Neck: supple no lymphadenopathy Cardiovascular: heart regular rate and rhythm, no murmur Lungs: clear to auscultation bilaterally Abdomen: soft, obese, nontender, nondistended, no obvious ascites, no peritoneal signs, normal bowel sounds, no organomegaly Rectal: Omitted Extremities: no clubbing, cyanosis, or lower extremity edema bilaterally Skin: no lesions on visible extremities Neuro: No focal deficits. No asterixis.    ASSESSMENT:  1.  Coffee-ground emesis and dark stools.  No significant active bleeding currently.  Hemoglobin slightly lower than baseline. 2.  History of hepatitis C without evidence of portal hypertension on noninvasive testing and laboratories 3.  Multiple medical problems 4.  History of CVA on Eliquis .  Has been held for about 1 week  PLAN:  1.  Continue PPI 2.  Continue to monitor hemoglobin levels 3.  Transfuse for hemoglobin less than 7 4.  Upper endoscopy.  The patient is higher than baseline  risk given her comorbidities.The nature of the procedure, as well as the risks, benefits, and alternatives were carefully and thoroughly reviewed with the patient. Ample time for discussion and questions allowed. The patient understood, was satisfied, and agreed to proceed.  I was hoping to perform her procedure for her today.  Unfortunately,, anesthesia services are quite limited and not readily available anytime soon.  Thus, we will schedule her procedure for tomorrow morning.  I think this is quite reasonable given her stable nature.  We will let her have full liquids today and n.p.o. after midnight.  Norleen SAILOR. Abran Raddle., M.D. [facility] Division of Gastroenterology

## 2023-02-20 NOTE — Assessment & Plan Note (Signed)
 INR 1.0

## 2023-02-20 NOTE — Assessment & Plan Note (Signed)
 Hemoglobin continued to trend downward throughout the day, now to 6.3. Transfusing 2 units of packed red blood cells Case discussed with Dr. Abran with gastroenterology, planning to put EGD tomorrow in the setting of transfusion Continuing Protonix  40 mg IV every 12 hours Continue to monitor hemoglobin and hematocrit with serial CBCs Holding home regimen of Eliquis  which was for a left lower extremity DVT

## 2023-02-20 NOTE — Assessment & Plan Note (Signed)
 Patient was originally placed on Eliquis first week of October for a left femoral DVT Patient has now received an approximate 66-month course of anticoagulation and this can likely be permanently discontinued.

## 2023-02-20 NOTE — Hospital Course (Addendum)
 67 y.o. 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 Harden (circumstances surrounding this are summarized below  in italics), left femoral vein DVT diagnosed 11/14/2022 on Eliquis , anemia, anxiety, bipolar disorder, left breast cancer, depression, GERD, hypertension, history of CVA with residual left-sided hemiparesis (2014), history of hep C liver cirrhosis who presented to [facility] with complaints of epigastric abdominal pain melena and hematemesis.    Of note, patient suffered from multiple infectious complications that have emanated from an ORIF of the distal femur fracture in 2023 eventually culminating in a left AKA 10/2022 by Dr. Harden  and long-term intravenous antibiotics with vancomycin  and ceftriaxone  managed with Dr. Overton with infectious disease through 01/26/2023 after which the patient was transitioned to oral amoxicillin .  Upon evaluation in the emergency department patient was found to have a hemoglobin of 9.1.  Fecal occult blood was positive.  The hospitalist group was then called to assess the patient for mission to the hospital.    Patient was initially admitted to the stepdown unit.   Patient's home regimen of Eliquis  had been held since the Sunday prior and continued to be held.  Hemoglobin and hematocrit was monitored with serial CBCs.  Patient was continued on Protonix  40 mg IV every 12 hours.  Dr. Abran with gastroenterology was consulted for consideration of endoscopic evaluation.    In the first several days of hospitalization patient's hemoglobin continued to downtrend to a nadir of 6.3 requiring 2 unit packed red blood cell transfusion.  Per gastroenterology's recommendation, patient underwent upper endoscopy on 1/12 which revealed no stigmata of active bleeding.  It is felt that patient's bleeding was likely secondary to ongoing Eliquis  use for DVT diagnosed in early October and this had already been stopped leading to the bleeding eventually stopping as  well.  Gastroenterology felt that the patient was safe for discharge.  Considering the patient's DVT was diagnosed the first week of October the patient has already received approximately 3 months of therapy and therefore it was felt that resumption of anticoagulation was not necessary.  Patient was discharged home in improved and stable condition on 02/21/2023.

## 2023-02-20 NOTE — Consult Note (Addendum)
 [facility] CONSULTATION NOTE Referring: [facility] hospitalist Primary GI: Dr. San Reason for consultation: Coffee-ground emesis     HISTORY OF PRESENT ILLNESS:  Tammy [name] is a 68 y.o. female  with medical history significant of anemia, anxiety, osteoarthritis, bipolar disorder, left breast cancer, depression, GERD, hypertension, radiation therapy, osteopenia, history of CVA with residual left-sided hemiparesis, history of hep C liver cirrhosis who has mild epigastric abdominal pain, had 1 or 2 episodes of coffee-ground hematemesis/1 or 2 episodes of dark stools daily since Sunday.  She has had neither the past 2 days.  Vague abdominal discomfort.  She held her apixaban  on Sunday evening and has not taken it since then.  She has felt tired, occasionally dyspneic on exertion and mildly lightheaded.  To her knowledge, no history of PUD or gastritis.She has not been using NSAIDs, only Tylenol .  She does take pantoprazole  daily at home for GERD.  She underwent colonoscopy November 2023.  She was found to have multiple diminutive polyps which were adenomatous, sessile serrated, and hyperplastic.  Also right sided diverticulosis.  No prior upper endoscopy.  She is chronically anemic.  Baseline hemoglobins run in the 8-9 range.  Hemoglobin yesterday 7.4.  Hemoglobin today 7.4.  Comprehensive metabolic panel remarkable for potassium 3.0.  Prothrombin time and platelets normal.  She does have a history of hepatitis C with sustained virologic response and cure.  Followed by [facility] (last seen 12/10/2022).  Liver ultrasound showed irregularity of the liver.  Noninvasive testing for hepatic fibrosis found the patient unlikely to have significant portal hypertension.  EGD not felt warranted.    REVIEW OF SYSTEMS:  All non-GI ROS negative except for back pain  Past Medical History:  Diagnosis Date   Anemia    hx of   Anxiety    on meds   Arthritis    back/bilateral ankles    Bipolar disorder (HCC)    Breast cancer (HCC) 2022   LEFT breast-radiation   Depression    on meds   Family history of breast cancer 07/10/2020   Family history of colon cancer 07/10/2020   Family history of ovarian cancer 07/10/2020   GERD (gastroesophageal reflux disease)    on meds   Heart murmur    dx by PCP   Hepatitis C    HTN (hypertension)    on meds   Hx of radiation therapy    Liver cirrhosis (HCC)    from hep c   Neuromuscular disorder (HCC)    Osteopenia    Personal history of radiation therapy    Stroke (HCC) 09/2012   slurred speech and LEFT sided weakness/notes 10/04/2012    Past Surgical History:  Procedure Laterality Date   AMPUTATION Left 11/04/2022   Procedure: LEFT ABOVE KNEE AMPUTATION;  Surgeon: Harden Jerona GAILS, MD;  Location: [facility] OR;  Service: Orthopedics;  Laterality: Left;   ANKLE FRACTURE SURGERY Left 2018   APPLICATION OF WOUND VAC Left 06/15/2022   Procedure: APPLICATION OF WOUND VAC;  Surgeon: Lowery Estefana RAMAN, DO;  Location: [facility];  Service: Plastics;  Laterality: Left;   APPLICATION OF WOUND VAC Left 09/14/2022   Procedure: APPLICATION OF WOUND VAC;  Surgeon: Lowery Estefana RAMAN, DO;  Location: [facility] OR;  Service: Plastics;  Laterality: Left;   BREAST LUMPECTOMY Left 07/2020   BREAST LUMPECTOMY WITH RADIOACTIVE SEED AND SENTINEL LYMPH NODE BIOPSY Left 07/26/2020   Procedure: LEFT BREAST LUMPECTOMY WITH RADIOACTIVE SEED AND SENTINEL LYMPH NODE BIOPSY;  Surgeon: Vernetta Berg, MD;  Location: [facility];  Service: General;  Laterality: Left;   CESAREAN SECTION  1986   COLONOSCOPY     DIAGNOSTIC LAPAROSCOPY  1985   HARDWARE REMOVAL Left 04/30/2022   Procedure: HARDWARE REMOVAL LEFT FEMUR;  Surgeon: Fidel Rogue, MD;  Location: [facility];  Service: Orthopedics;  Laterality: Left;  150   HARDWARE REMOVAL Left 11/04/2022   Procedure: HARDWARE REMOVAL INFECTED LEFT TOTAL KNEE ARTHROPLASTY;  Surgeon: Harden Jerona GAILS, MD;  Location: [facility]  OR;  Service: Orthopedics;  Laterality: Left;   I & D KNEE WITH POLY EXCHANGE Left 06/07/2022   Procedure: IRRIGATION AND DEBRIDEMENT KNEE WITH POLY EXCHANGE;  Surgeon: Fidel Rogue, MD;  Location: [facility];  Service: Orthopedics;  Laterality: Left;   INCISION AND DRAINAGE OF WOUND Left 06/04/2022   Procedure: IRRIGATION AND DEBRIDEMENT KNEE;  Surgeon: Fidel Rogue, MD;  Location: [facility];  Service: Orthopedics;  Laterality: Left;  60   INCISION AND DRAINAGE OF WOUND Left 06/15/2022   Procedure: excision of knee wound with Myriad;  Surgeon: Lowery Estefana RAMAN, DO;  Location: [facility];  Service: Plastics;  Laterality: Left;   ORIF FEMUR FRACTURE Left 04/10/2021   Procedure: OPEN REDUCTION INTERNAL FIXATION (ORIF) DISTAL FEMUR FRACTURE;  Surgeon: Celena Sharper, MD;  Location: [facility] OR;  Service: Orthopedics;  Laterality: Left;   PARTIAL KNEE ARTHROPLASTY Left 04/30/2022   Procedure: DISTAL FEMUR REPLACEMENT;  Surgeon: Fidel Rogue, MD;  Location: [facility];  Service: Orthopedics;  Laterality: Left;  150    Social History Tammy [name]  reports that she has quit smoking. Her smoking use included cigarettes. She has a 60 pack-year smoking history. She has never used smokeless tobacco. She reports that she does not currently use alcohol . She reports that she does not use drugs.  family history includes Breast cancer in her maternal aunt, mother, and another family member; Colon cancer in an other family member; Colon cancer (age of onset: 69) in her maternal aunt; Diabetes in her brother, paternal aunt, and sister; Neuropathy in her brother and sister; Other in her mother; Ovarian cancer in an other family member.  Allergies  Allergen Reactions   Lisinopril Cough       PHYSICAL EXAMINATION: Vital signs: BP (!) 147/107   Pulse 73   Temp 98.6 F (37 C) (Oral)   Resp 20   Ht 5' 2 (1.575 m)   Wt 72.2 kg   SpO2 100%   BMI 29.11 kg/m   Constitutional: generally well-appearing,  no acute distress Psychiatric: alert and oriented x3, cooperative Eyes: extraocular movements intact, anicteric, conjunctiva pink Mouth: oral pharynx moist, no lesions Neck: supple no lymphadenopathy Cardiovascular: heart regular rate and rhythm, no murmur Lungs: clear to auscultation bilaterally Abdomen: soft, obese, nontender, nondistended, no obvious ascites, no peritoneal signs, normal bowel sounds, no organomegaly Rectal: Omitted Extremities: no clubbing, cyanosis, or lower extremity edema bilaterally Skin: no lesions on visible extremities Neuro: No focal deficits. No asterixis.    ASSESSMENT:  1.  Coffee-ground emesis and dark stools.  No significant active bleeding currently.  Hemoglobin slightly lower than baseline. 2.  History of hepatitis C without evidence of portal hypertension on noninvasive testing and laboratories 3.  Multiple medical problems 4.  History of CVA on Eliquis .  Has been held for about 1 week  PLAN:  1.  Continue PPI 2.  Continue to monitor hemoglobin levels 3.  Transfuse for hemoglobin less than 7 4.  Upper endoscopy.  The patient is higher than baseline  risk given her comorbidities.The nature of the procedure, as well as the risks, benefits, and alternatives were carefully and thoroughly reviewed with the patient. Ample time for discussion and questions allowed. The patient understood, was satisfied, and agreed to proceed.  I was hoping to perform her procedure for her today.  Unfortunately,, anesthesia services are quite limited and not readily available anytime soon.  Thus, we will schedule her procedure for tomorrow morning.  I think this is quite reasonable given her stable nature.  We will let her have full liquids today and n.p.o. after midnight.  Tammy [name]. Abran Raddle., M.D. [facility] Division of Gastroenterology

## 2023-02-20 NOTE — Assessment & Plan Note (Signed)
 Continuing bupropion

## 2023-02-20 NOTE — Progress Notes (Signed)
 PROGRESS NOTE   Tammy [name]  FMW:[id number] DOB: 06-29-1955 DOA: 02/19/2023 PCP: Norval Kettle, MD   Date of Service: the patient was seen and examined on 02/20/2023  Brief Narrative:  68 y.o. 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 Harden (circumstances surrounding this are summarized below  in italics), left femoral vein DVT diagnosed 11/14/2022 on Eliquis , anemia, anxiety, bipolar disorder, left breast cancer, depression, GERD, hypertension, history of CVA with residual left-sided hemiparesis (2014), history of hep C liver cirrhosis who presented to [facility] with complaints of epigastric abdominal pain melena and hematemesis.    Of note, patient suffered from multiple infectious complications that have emanated from an ORIF of the distal femur fracture in 2023 eventually culminating in a left AKA 10/2022 by Dr. Harden  and long-term intravenous antibiotics with vancomycin  and ceftriaxone  managed with Dr. Overton with infectious disease through 01/26/2023 after which the patient was transitioned to oral amoxicillin .  Upon evaluation in the emergency department patient was found to have a hemoglobin of 9.1.  Fecal occult blood was positive.  The hospitalist group was then called to assess the patient for mission to the hospital.    Patient was initially admitted to the stepdown unit.  Gastroenterology was consulted.  Patient's home regimen of Eliquis  had been held since the Sunday prior and continued to be held.  Hemoglobin and hematocrit was monitored with serial CBCs.   Assessment & Plan Acute upper gastrointestinal bleeding Hemoglobin continued to trend downward throughout the day, now to 6.3. Transfusing 2 units of packed red blood cells Case discussed with Dr. Abran with gastroenterology, planning to put EGD tomorrow in the setting of transfusion Continuing Protonix  40 mg IV every 12 hours Continue to monitor hemoglobin and hematocrit with serial  CBCs Holding home regimen of Eliquis  which was for a left lower extremity DVT Left femoral vein DVT (HCC) Patient was originally placed on Eliquis  first week of October for a left femoral DVT Patient has now received an approximate 75-month course of anticoagulation and this can likely be permanently discontinued. Essential hypertension Uncontrolled blood pressures, will add 50 mg of losartan  daily.   Resume patients home regimen of amlodipine  PRN intravenous antihypertensives for excessively elevated blood pressure  Hypokalemia Replacing with potassium chloride  Evaluating for concurrent hypomagnesemia  Monitoring potassium levels with serial chemistries.  Chronic hepatitis C with cirrhosis (HCC) INR 1.0 Bipolar disorder, unspecified (HCC) Continuing bupropion  Malignant neoplasm of upper-outer quadrant of left breast in female, estrogen receptor positive (HCC) Continue Arimidex  GERD (gastroesophageal reflux disease) On Protonix  as above    Subjective:  Patient reports that her last episodes of melena and coffee-ground emesis or on Wednesday.  Patient does complain of occasional epigastric pain, mild intensity sharp in quality.  Physical Exam:  Vitals:   02/20/23 0400 02/20/23 0500 02/20/23 0546 02/20/23 0600  BP: (!) 170/86 (!) 178/97 (!) 178/97 (!) 162/79  Pulse: 73 69 90 85  Resp: (!) 22 17 (!) 25 (!) 23  Temp: 97.9 F (36.6 C)     TempSrc: Oral     SpO2: 100% 99% 100% 100%    Constitutional: Awake alert and oriented x3, no associated distress.   Skin: no rashes, no lesions, good skin turgor noted. Eyes: Pupils are equally reactive to light.  No evidence of scleral icterus or conjunctival pallor.  ENMT: Moist mucous membranes noted.  Posterior pharynx clear of any exudate or lesions.   Respiratory: clear to auscultation bilaterally, no wheezing, no  crackles. Normal respiratory effort. No accessory muscle use.  Cardiovascular: Regular rate and rhythm, no murmurs / rubs  / gallops. No extremity edema. 2+ pedal pulses. No carotid bruits.  Abdomen: Notable epigastric tenderness.  Abdomen is soft.  No evidence of intra-abdominal masses.  Positive bowel sounds noted in all quadrants.   Musculoskeletal: Evidence of left AKA.  Otherwise, right lower extremity has no pain with passive and active range of motion and is nontender with normal muscle tone.   Data Reviewed:  I have personally reviewed and interpreted labs, imaging.  Significant findings are   CBC: Recent Labs  Lab 02/19/23 1211 02/19/23 1635 02/19/23 2235 02/20/23 0322  WBC 7.1  --   --  5.2  HGB 8.1* 8.9* 7.4* 7.4*  HCT 25.6* 28.8* 23.4* 23.6*  MCV 84.8  --   --  82.2  PLT 259  --   --  230   Basic Metabolic Panel: Recent Labs  Lab 02/19/23 1211 02/19/23 2235 02/20/23 0322  NA 138  --  142  K 3.0*  --  3.0*  CL 105  --  108  CO2 25  --  24  GLUCOSE 101*  --  94  BUN 10  --  8  CREATININE 0.65  --  0.49  CALCIUM  9.1  --  9.0  MG  --  1.8  --   PHOS  --  2.5  --    GFR: CrCl cannot be calculated (Unknown ideal weight.). Liver Function Tests: Recent Labs  Lab 02/19/23 1211 02/20/23 0322  AST 15 13*  ALT 11 9  ALKPHOS 76 65  BILITOT <0.2 0.4  PROT 7.4 6.6  ALBUMIN 3.2* 2.9*    Coagulation Profile: Recent Labs  Lab 02/19/23 2235  INR 1.0     EKG/Telemetry: Personally reviewed.  Rhythm is normal sinus rhythm with heart rate of 90 bpm.  No dynamic ST segment changes appreciated.   Code Status:  Full code.  Code status decision has been confirmed with: patient    Severity of Illness:  The appropriate patient status for this patient is INPATIENT. Inpatient status is judged to be reasonable and necessary in order to provide the required intensity of service to ensure the patient's safety. The patient's presenting symptoms, physical exam findings, and initial radiographic and laboratory data in the context of their chronic comorbidities is felt to place them at high  risk for further clinical deterioration. Furthermore, it is not anticipated that the patient will be medically stable for discharge from the hospital within 2 midnights of admission.   * I certify that at the point of admission it is my clinical judgment that the patient will require inpatient hospital care spanning beyond 2 midnights from the point of admission due to high intensity of service, high risk for further deterioration and high frequency of surveillance required.*  Time spent:  57 minutes  Author:  Zachary JINNY Ba MD  02/20/2023 7:52 AM

## 2023-02-21 ENCOUNTER — Encounter (HOSPITAL_COMMUNITY): Payer: Self-pay

## 2023-02-21 ENCOUNTER — Inpatient Hospital Stay (HOSPITAL_COMMUNITY): Payer: Medicare Other

## 2023-02-21 ENCOUNTER — Encounter (HOSPITAL_COMMUNITY)
Admission: EM | Disposition: A | Discharge status: Home / Self Care | Payer: Self-pay | Source: Home / Self Care | Attending: Internal Medicine

## 2023-02-21 DIAGNOSIS — B182 Chronic viral hepatitis C: Secondary | ICD-10-CM | POA: Diagnosis not present

## 2023-02-21 DIAGNOSIS — K317 Polyp of stomach and duodenum: Secondary | ICD-10-CM

## 2023-02-21 DIAGNOSIS — E876 Hypokalemia: Secondary | ICD-10-CM | POA: Diagnosis not present

## 2023-02-21 DIAGNOSIS — K222 Esophageal obstruction: Secondary | ICD-10-CM | POA: Diagnosis not present

## 2023-02-21 DIAGNOSIS — K922 Gastrointestinal hemorrhage, unspecified: Secondary | ICD-10-CM | POA: Diagnosis not present

## 2023-02-21 DIAGNOSIS — K449 Diaphragmatic hernia without obstruction or gangrene: Secondary | ICD-10-CM

## 2023-02-21 DIAGNOSIS — K920 Hematemesis: Secondary | ICD-10-CM | POA: Diagnosis not present

## 2023-02-21 DIAGNOSIS — I10 Essential (primary) hypertension: Secondary | ICD-10-CM

## 2023-02-21 HISTORY — PX: ESOPHAGOGASTRODUODENOSCOPY: SHX5428

## 2023-02-21 LAB — COMPREHENSIVE METABOLIC PANEL
ALT: 10 U/L (ref 0–44)
AST: 13 U/L — ABNORMAL LOW (ref 15–41)
Albumin: 3.2 g/dL — ABNORMAL LOW (ref 3.5–5.0)
Alkaline Phosphatase: 70 U/L (ref 38–126)
Anion gap: 10 (ref 5–15)
BUN: 6 mg/dL — ABNORMAL LOW (ref 8–23)
CO2: 23 mmol/L (ref 22–32)
Calcium: 9.4 mg/dL (ref 8.9–10.3)
Chloride: 106 mmol/L (ref 98–111)
Creatinine, Ser: 0.6 mg/dL (ref 0.44–1.00)
GFR, Estimated: >60 mL/min (ref >60)
Glucose, Bld: 96 mg/dL (ref 70–99)
Potassium: 3.8 mmol/L (ref 3.5–5.1)
Sodium: 139 mmol/L (ref 135–145)
Total Bilirubin: 0.7 mg/dL (ref 0.0–1.2)
Total Protein: 7 g/dL (ref 6.5–8.1)

## 2023-02-21 LAB — CBC WITH DIFFERENTIAL/PLATELET
Abs Immature Granulocytes: 0.05 10*3/uL (ref 0.00–0.07)
Basophils Absolute: 0 10*3/uL (ref 0.0–0.1)
Basophils Relative: 0 %
Eosinophils Absolute: 0.2 10*3/uL (ref 0.0–0.5)
Eosinophils Relative: 3 %
HCT: 36.5 % (ref 36.0–46.0)
Hemoglobin: 11.6 g/dL — ABNORMAL LOW (ref 12.0–15.0)
Immature Granulocytes: 1 %
Lymphocytes Relative: 25 %
Lymphs Abs: 1.4 10*3/uL (ref 0.7–4.0)
MCH: 26.9 pg (ref 26.0–34.0)
MCHC: 31.8 g/dL (ref 30.0–36.0)
MCV: 84.7 fL (ref 80.0–100.0)
Monocytes Absolute: 0.4 10*3/uL (ref 0.1–1.0)
Monocytes Relative: 7 %
Neutro Abs: 3.7 10*3/uL (ref 1.7–7.7)
Neutrophils Relative %: 64 %
Platelets: 236 10*3/uL (ref 150–400)
RBC: 4.31 MIL/uL (ref 3.87–5.11)
RDW: 15.3 % (ref 11.5–15.5)
WBC: 5.7 10*3/uL (ref 4.0–10.5)
nRBC: 0 % (ref 0.0–0.2)

## 2023-02-21 LAB — MAGNESIUM: Magnesium: 2 mg/dL (ref 1.7–2.4)

## 2023-02-21 SURGERY — EGD (ESOPHAGOGASTRODUODENOSCOPY)
Anesthesia: Monitor Anesthesia Care

## 2023-02-21 MED ORDER — PANTOPRAZOLE SODIUM 40 MG PO TBEC: 40.0000 mg | DELAYED_RELEASE_TABLET | Freq: Every day | ORAL | 2 refills | Status: DC

## 2023-02-21 MED ORDER — PROPOFOL 500 MG/50ML IV EMUL
INTRAVENOUS | Status: DC | PRN
  Administered 2023-02-21: 125 ug/kg/min via INTRAVENOUS

## 2023-02-21 MED ORDER — HYDRALAZINE HCL 25 MG PO TABS: 25.0000 mg | ORAL_TABLET | Freq: Two times a day (BID) | ORAL | 2 refills | Status: DC

## 2023-02-21 MED ORDER — ENSURE ENLIVE PO LIQD: 237.0000 mL | Freq: Two times a day (BID) | ORAL | Status: DC

## 2023-02-21 MED ORDER — ONDANSETRON HCL 4 MG/2ML IJ SOLN
INTRAMUSCULAR | Status: DC | PRN
  Administered 2023-02-21: 4 mg via INTRAVENOUS

## 2023-02-21 MED ORDER — GABAPENTIN 300 MG PO CAPS: 300.0000 mg | ORAL_CAPSULE | Freq: Every day | ORAL | 2 refills | Status: DC

## 2023-02-21 MED ORDER — PROPOFOL 10 MG/ML IV BOLUS
INTRAVENOUS | Status: DC | PRN
  Administered 2023-02-21: 70 mg via INTRAVENOUS

## 2023-02-21 MED ORDER — PROMETHAZINE HCL 25 MG PO TABS: 25.0000 mg | ORAL_TABLET | Freq: Four times a day (QID) | ORAL | 0 refills | Status: AC | PRN

## 2023-02-21 MED ORDER — LOSARTAN POTASSIUM 50 MG PO TABS: 50.0000 mg | ORAL_TABLET | Freq: Every day | ORAL | 2 refills | Status: AC

## 2023-02-21 MED ORDER — LIDOCAINE HCL (CARDIAC) PF 100 MG/5ML IV SOSY
PREFILLED_SYRINGE | INTRAVENOUS | Status: DC | PRN
  Administered 2023-02-21: 100 mg via INTRAVENOUS

## 2023-02-21 MED ORDER — SODIUM CHLORIDE 0.9 % IV SOLN: INTRAVENOUS | Status: DC

## 2023-02-21 MED ORDER — PROPOFOL 500 MG/50ML IV EMUL
INTRAVENOUS | Status: AC
  Filled 2023-02-21: qty 50

## 2023-02-21 NOTE — Op Note (Signed)
 [facility] Patient Name: Tammy [name] Procedure Date: 02/21/2023 MRN: [id number] Attending MD: Norleen SAILOR. Abran , MD, [id number] Date of Birth: [birth year] CSN: [id number] Age: 68 Admit Type: Inpatient Procedure:                Upper GI endoscopy Indications:              Coffee-ground emesis Providers:                Norleen SAILOR. Abran, MD, Particia Fischer, RN, Curtistine Bishop, Technician Referring MD:             [facility] Medicines:                Monitored Anesthesia Care Complications:            No immediate complications. Estimated Blood Loss:     Estimated blood loss: none. Procedure:                Pre-Anesthesia Assessment:                           - Prior to the procedure, a History and Physical                            was performed, and patient medications and                            allergies were reviewed. The patient's tolerance of                            previous anesthesia was also reviewed. The risks                            and benefits of the procedure and the sedation                            options and risks were discussed with the patient.                            All questions were answered, and informed consent                            was obtained. Prior Anticoagulants: The patient has                            taken Eliquis  (apixaban ), last dose was 7 days                            prior to procedure. ASA Grade Assessment: III - A                            patient with severe systemic disease. After  reviewing the risks and benefits, the patient was                            deemed in satisfactory condition to undergo the                            procedure.                           After obtaining informed consent, the endoscope was                            passed under direct vision. Throughout the                            procedure, the patient's blood  pressure, pulse, and                            oxygen saturations were monitored continuously. The                            [id number]-H190 ([id number]) Olympus endoscope was introduced                            through the mouth, and advanced to the second part                            of duodenum. The upper GI endoscopy was                            accomplished without difficulty. The patient                            tolerated the procedure well. Scope In: Scope Out: Findings:      The esophagus revealed a large caliber ringlike stricture at the       gastroesophageal junction. No active esophagitis. No varices.      The stomach revealed a moderately large hiatal hernia without erosions.       Few diminutive benign fundic gland type polyps. The stomach was       otherwise normal. No blood or bleeding.      The examined duodenum was normal.      The cardia and gastric fundus were otherwise normal on retroflexion. Impression:               1. Incidental benign caliber esophageal ringlike                            stricture                           2. No varices                           3. Moderately large hiatal hernia  4. Few diminutive gastric polyps                           5. Otherwise unremarkable exam. No significant                            pathology identified. Suspect that she had                            coffee-ground emesis on the basis of transient                            retching gastropathy while anticoagulated.                            Fortunately, the exam currently is quite benign. Moderate Sedation:      none Recommendation:           1. Continue pantoprazole  daily                           2. Okay to resume Eliquis                            3. Regular diet                           4. Okay for discharge from GI perspective                           5. No outpatient GI follow-up at this time required                            Discussed with the patient and her fianc. They                            were provided a copy of this report. Will sign off. Procedure Code(s):        --- Professional ---                           [id number], Esophagogastroduodenoscopy, flexible,                            transoral; diagnostic, including collection of                            specimen(s) by brushing or washing, when performed                            (separate procedure) Diagnosis Code(s):        --- Professional ---                           K92.0, Hematemesis CPT copyright 2022 American Medical Association. All rights reserved. The codes documented in this report are preliminary and upon coder review may  be revised to meet current compliance requirements. Norleen SAILOR.  Abran, MD 02/21/2023 10:32:16 AM This report has been signed electronically. Number of Addenda: 0

## 2023-02-21 NOTE — Discharge Summary (Signed)
 Physician Discharge Summary   Patient: Tammy [name] MRN: [id number] DOB: 03/25/1955  Admit date:     02/19/2023  Discharge date: 02/21/23  Discharge Physician: Zachary JINNY Ba   PCP: Norval Kettle, MD   Recommendations at discharge:    Please take all prescribed medications exactly as instructed including: Taking Protonix  40 mg by mouth once daily for your acid reflux. Taking losartan  50 mg by mouth daily, hydralazine  25 mg by mouth twice daily and amlodipine  10 mg daily for your blood pressure Stop taking your Eliquis  for your diagnosis of blood clot Please consume a low-sodium diet Please increase your physical activity as tolerated using an assistive device or with assistance. Please maintain all outpatient follow-up appointments including follow-up with your primary care provider. Please return to the emergency department if you develop worsening pain, blood in your stool, weakness, chest pain, shortness of breath or inability to tolerate oral intake.    Discharge Diagnoses: Principal Problem:   Acute upper gastrointestinal bleeding Active Problems:   Essential hypertension   Hypokalemia   Chronic hepatitis C with cirrhosis (HCC)   Malignant neoplasm of upper-outer quadrant of left breast in female, estrogen receptor positive (HCC)   Bipolar disorder, unspecified (HCC)   Chronic pain syndrome   History of CVA (cerebrovascular accident)   S/P AKA (above knee amputation) unilateral, left (HCC)   Left femoral vein DVT (HCC)   GERD (gastroesophageal reflux disease)  Resolved Problems:   * No resolved hospital problems. *   Hospital Course: 68 y.o. 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 Harden (circumstances surrounding this are summarized below  in italics), left femoral vein DVT diagnosed 11/14/2022 on Eliquis , anemia, anxiety, bipolar disorder, left breast cancer, depression, GERD, hypertension, history of CVA with residual left-sided hemiparesis  (2014), history of hep C liver cirrhosis who presented to [facility] with complaints of epigastric abdominal pain melena and hematemesis.    Of note, patient suffered from multiple infectious complications that have emanated from an ORIF of the distal femur fracture in 2023 eventually culminating in a left AKA 10/2022 by Dr. Harden  and long-term intravenous antibiotics with vancomycin  and ceftriaxone  managed with Dr. Overton with infectious disease through 01/26/2023 after which the patient was transitioned to oral amoxicillin .  Upon evaluation in the emergency department patient was found to have a hemoglobin of 9.1.  Fecal occult blood was positive.  The hospitalist group was then called to assess the patient for mission to the hospital.    Patient was initially admitted to the stepdown unit.   Patient's home regimen of Eliquis  had been held since the Sunday prior and continued to be held.  Hemoglobin and hematocrit was monitored with serial CBCs.  Patient was continued on Protonix  40 mg IV every 12 hours.  Dr. Abran with gastroenterology was consulted for consideration of endoscopic evaluation.    In the first several days of hospitalization patient's hemoglobin continued to downtrend to a nadir of 6.3 requiring 2 unit packed red blood cell transfusion.  Per gastroenterology's recommendation, patient underwent upper endoscopy on 1/12 which revealed no stigmata of active bleeding.  It is felt that patient's bleeding was likely secondary to ongoing Eliquis  use for DVT diagnosed in early October and this had already been stopped leading to the bleeding eventually stopping as well.  Gastroenterology felt that the patient was safe for discharge.  Considering the patient's DVT was diagnosed the first week of October the patient has already received  approximately 3 months of therapy and therefore it was felt that resumption of anticoagulation was not necessary.  Patient was discharged home in improved and  stable condition on 02/21/2023.   Consultants: Dr. Abran with Gastroenterology Procedures performed: Upper Endoscopy 02/21/2023  Disposition: Home Diet recommendation:  Cardiac diet  DISCHARGE MEDICATION: Allergies as of 02/21/2023       Reactions   Lisinopril Cough        Medication List     STOP taking these medications    acetaminophen  325 MG tablet Commonly known as: Tylenol    amoxicillin  500 MG capsule Commonly known as: AMOXIL    amoxicillin -clavulanate 875-125 MG tablet Commonly known as: AUGMENTIN    doxycycline  100 MG tablet Commonly known as: VIBRA -TABS   Eliquis  5 MG Tabs tablet Generic drug: apixaban    melatonin 5 MG Tabs   TYLENOL  500 MG tablet Generic drug: acetaminophen        TAKE these medications    amLODipine  10 MG tablet Commonly known as: NORVASC  Take 1 tablet (10 mg total) by mouth daily.   anastrozole  1 MG tablet Commonly known as: ARIMIDEX  Take 1 tablet (1 mg total) by mouth daily.   BENGAY EX Apply 1 Application topically daily as needed (pain).   buPROPion  150 MG 12 hr tablet Commonly known as: WELLBUTRIN  SR Take 150 mg by mouth 2 (two) times daily.   calcium  carbonate 1250 (500 Ca) MG tablet Commonly known as: OS-CAL - dosed in mg of elemental calcium  Take 1 tablet by mouth daily.   gabapentin  300 MG capsule Commonly known as: NEURONTIN  Take 1 capsule (300 mg total) by mouth at bedtime.   hydrALAZINE  25 MG tablet Commonly known as: APRESOLINE  Take 1 tablet (25 mg total) by mouth 2 (two) times daily.   HYDROcodone -acetaminophen  10-325 MG tablet Commonly known as: NORCO Take 1 tablet by mouth every 4 (four) hours as needed (for pain).   losartan  50 MG tablet Commonly known as: COZAAR  Take 1 tablet (50 mg total) by mouth daily. Start taking on: February 22, 2023   pantoprazole  40 MG tablet Commonly known as: PROTONIX  Take 1 tablet (40 mg total) by mouth daily. What changed: when to take this   potassium chloride   10 MEQ tablet Commonly known as: KLOR-CON  Take 10 mEq by mouth daily.   pregabalin  150 MG capsule Commonly known as: Lyrica  Take 1 capsule (150 mg total) by mouth 2 (two) times daily.   PRENATAL PO Take 1 tablet by mouth daily.   promethazine  25 MG tablet Commonly known as: PHENERGAN  Take 1 tablet (25 mg total) by mouth every 6 (six) hours as needed for nausea or vomiting.   VITAMIN B-12 PO Take 2,000 mcg by mouth daily.   vitamin C  1000 MG tablet Take 1,000 mg by mouth daily.   zinc  gluconate 50 MG tablet Take 50 mg by mouth daily.        Follow-up Information     Norval Kettle, MD Follow up in 1 week(s).   Specialty: Internal Medicine Contact information: [address], [place] [place] [address] [phone]                 Discharge Exam: Filed Weights   02/20/23 0943 02/21/23 0844  Weight: 72.2 kg 72.2 kg    Constitutional: Awake alert and oriented x3, no associated distress.   Respiratory: clear to auscultation bilaterally, no wheezing, no crackles. Normal respiratory effort. No accessory muscle use.  Cardiovascular: Regular rate and rhythm, no murmurs / rubs / gallops.  No extremity edema. 2+ pedal pulses. No carotid bruits.  Abdomen: Abdomen is soft and nontender.  No evidence of intra-abdominal masses.  Positive bowel sounds noted in all quadrants.   Musculoskeletal: Left AKA. Condition at discharge: fair  The results of significant diagnostics from this hospitalization (including imaging, microbiology, ancillary and laboratory) are listed below for reference.   Imaging Studies: No results found.  Microbiology: Results for orders placed or performed during the hospital encounter of 11/04/22  Aerobic/Anaerobic Culture w Gram Stain (surgical/deep wound)     Status: None   Collection Time: 11/04/22 10:24 AM   Specimen: PATH Amputaion Arm/Leg; Tissue  Result Value Ref Range Status   Specimen Description TISSUE AMP ARM LEG LEFT  Final    Special Requests A  Final   Gram Stain NO WBC SEEN NO ORGANISMS SEEN   Final   Culture   Final    No growth aerobically or anaerobically. Performed at [facility], [address], [place], [place] [address]    Report Status 11/09/2022 FINAL  Final  Aerobic/Anaerobic Culture w Gram Stain (surgical/deep wound)     Status: None   Collection Time: 11/04/22 10:32 AM   Specimen: Bone; Tissue  Result Value Ref Range Status   Specimen Description BONE LEFT LEG ABOVE KNEE  Final   Special Requests B  Final   Gram Stain NO WBC SEEN NO ORGANISMS SEEN   Final   Culture   Final    No growth aerobically or anaerobically. Performed at [facility], [address], [place], [place] [address]    Report Status 11/09/2022 FINAL  Final  Surgical pcr screen     Status: None   Collection Time: 11/04/22  5:15 PM   Specimen: Nasal Mucosa; Nasal Swab  Result Value Ref Range Status   MRSA, PCR NEGATIVE NEGATIVE Final   Staphylococcus aureus NEGATIVE NEGATIVE Final    Comment: (NOTE) The Xpert SA Assay (FDA approved for NASAL specimens in patients 80 years of age and older), is one component of a comprehensive surveillance program. It is not intended to diagnose infection nor to guide or monitor treatment. Performed at [facility], [address], [place], [place] [address]     Labs: CBC: Recent Labs  Lab 02/19/23 1211 02/19/23 1635 02/19/23 2235 02/20/23 0322 02/20/23 1159 02/20/23 2159 02/21/23 0336  WBC 7.1  --   --  5.2 6.9  --  5.7  NEUTROABS  --   --   --   --   --   --  3.7  HGB 8.1*   < > 7.4* 7.4* 6.3* 11.6* 11.6*  HCT 25.6*   < > 23.4* 23.6* 20.3* 34.5* 36.5  MCV 84.8  --   --  82.2 83.5  --  84.7  PLT 259  --   --  230 293  --  236   < > = values in this interval not displayed.   Basic Metabolic Panel: Recent Labs  Lab 02/19/23 1211 02/19/23 2235 02/20/23 0322 02/20/23 1159 02/21/23 0336  NA 138  --  142  --  139  K 3.0*  --  3.0*  --  3.8  CL  105  --  108  --  106  CO2 25  --  24  --  23  GLUCOSE 101*  --  94  --  96  BUN 10  --  8  --  6*  CREATININE 0.65  --  0.49  --  0.60  CALCIUM  9.1  --  9.0  --  9.4  MG  --  1.8  --  1.7 2.0  PHOS  --  2.5  --   --   --    Liver Function Tests: Recent Labs  Lab 02/19/23 1211 02/20/23 0322 02/21/23 0336  AST 15 13* 13*  ALT 11 9 10   ALKPHOS 76 65 70  BILITOT <0.2 0.4 0.7  PROT 7.4 6.6 7.0  ALBUMIN 3.2* 2.9* 3.2*   CBG: No results for input(s): GLUCAP in the last 168 hours.  Discharge time spent: greater than 30 minutes.  Signed: Zachary JINNY Ba, MD [facility] 02/21/2023

## 2023-02-21 NOTE — Anesthesia Preprocedure Evaluation (Signed)
 Anesthesia Evaluation  Patient identified by MRN, date of birth, ID band Patient awake    Reviewed: Allergy & Precautions, NPO status , Patient's Chart, lab work & pertinent test results  Airway Mallampati: II  TM Distance: >3 FB Neck ROM: Full    Dental  (+) Dental Advisory Given   Pulmonary former smoker   breath sounds clear to auscultation       Cardiovascular hypertension, Pt. on medications  Rhythm:Regular Rate:Normal     Neuro/Psych  Neuromuscular disease CVA    GI/Hepatic ,GERD  ,,(+) Hepatitis -, CGI bleed   Endo/Other  negative endocrine ROS    Renal/GU negative Renal ROS     Musculoskeletal  (+) Arthritis ,    Abdominal   Peds  Hematology  (+) Blood dyscrasia, anemia   Anesthesia Other Findings   Reproductive/Obstetrics                             Anesthesia Physical Anesthesia Plan  ASA: 3  Anesthesia Plan: MAC   Post-op Pain Management:    Induction:   PONV Risk Score and Plan: 2 and Propofol  infusion and Ondansetron   Airway Management Planned: Natural Airway and Nasal Cannula  Additional Equipment:   Intra-op Plan:   Post-operative Plan:   Informed Consent: I have reviewed the patients History and Physical, chart, labs and discussed the procedure including the risks, benefits and alternatives for the proposed anesthesia with the patient or authorized representative who has indicated his/her understanding and acceptance.       Plan Discussed with:   Anesthesia Plan Comments:        Anesthesia Quick Evaluation

## 2023-02-21 NOTE — Interval H&P Note (Signed)
 History and Physical Interval Note:  02/21/2023 9:09 AM  Tammy [name]  has presented today for surgery, with the diagnosis of coffee ground emesis, melena.  The various methods of treatment have been discussed with the patient and family. After consideration of risks, benefits and other options for treatment, the patient has consented to  Procedure(s): ESOPHAGOGASTRODUODENOSCOPY (EGD) (N/A) as a surgical intervention.  The patient's history has been reviewed, patient examined, no change in status, stable for surgery.  I have reviewed the patient's chart and labs.  Questions were answered to the patient's satisfaction.     Norleen Kiang

## 2023-02-21 NOTE — Progress Notes (Signed)
Consent signed and placed in front of chart.  ?

## 2023-02-21 NOTE — Transfer of Care (Signed)
 Immediate Anesthesia Transfer of Care Note  Patient: Tammy [name]  Procedure(s) Performed: ESOPHAGOGASTRODUODENOSCOPY (EGD)  Patient Location: Endoscopy Unit  Anesthesia Type:MAC  Level of Consciousness: awake and patient cooperative  Airway & Oxygen Therapy: Patient Spontanous Breathing  Post-op Assessment: Report given to RN and Post -op Vital signs reviewed and stable  Post vital signs: Reviewed and stable  Last Vitals:  Vitals Value Taken Time  BP 129/89 02/21/23 1031  Temp 36.6 C 02/21/23 1030  Pulse 79 02/21/23 1034  Resp 20 02/21/23 1034  SpO2 85 % 02/21/23 1034  Vitals shown include unfiled device data.  Last Pain:  Vitals:   02/21/23 1030  TempSrc: Temporal  PainSc:       Patients Stated Pain Goal: 0 (02/19/23 1836)  Complications: No notable events documented.

## 2023-02-21 NOTE — Addendum Note (Signed)
 Addendum  created 02/21/23 1342 by Oletha Cruel, CRNA   Intraprocedure Event edited

## 2023-02-21 NOTE — Progress Notes (Signed)
 Nsg Discharge Note  Admit Date:  02/19/2023 Discharge date: 02/21/2023   Tammy [name] to be D/C'd Home per MD order.  AVS completed.  Copy for chart, and copy for patient signed, and dated. Patient/caregiver able to verbalize understanding.  Discharge Medication: Allergies as of 02/21/2023       Reactions   Lisinopril Cough        Medication List     STOP taking these medications    acetaminophen  325 MG tablet Commonly known as: Tylenol    amoxicillin  500 MG capsule Commonly known as: AMOXIL    amoxicillin -clavulanate 875-125 MG tablet Commonly known as: AUGMENTIN    doxycycline  100 MG tablet Commonly known as: VIBRA -TABS   Eliquis  5 MG Tabs tablet Generic drug: apixaban    melatonin 5 MG Tabs   TYLENOL  500 MG tablet Generic drug: acetaminophen        TAKE these medications    amLODipine  10 MG tablet Commonly known as: NORVASC  Take 1 tablet (10 mg total) by mouth daily.   anastrozole  1 MG tablet Commonly known as: ARIMIDEX  Take 1 tablet (1 mg total) by mouth daily.   BENGAY EX Apply 1 Application topically daily as needed (pain).   buPROPion  150 MG 12 hr tablet Commonly known as: WELLBUTRIN  SR Take 150 mg by mouth 2 (two) times daily.   calcium  carbonate 1250 (500 Ca) MG tablet Commonly known as: OS-CAL - dosed in mg of elemental calcium  Take 1 tablet by mouth daily.   gabapentin  300 MG capsule Commonly known as: NEURONTIN  Take 1 capsule (300 mg total) by mouth at bedtime.   hydrALAZINE  25 MG tablet Commonly known as: APRESOLINE  Take 1 tablet (25 mg total) by mouth 2 (two) times daily.   HYDROcodone -acetaminophen  10-325 MG tablet Commonly known as: NORCO Take 1 tablet by mouth every 4 (four) hours as needed (for pain).   losartan  50 MG tablet Commonly known as: COZAAR  Take 1 tablet (50 mg total) by mouth daily. Start taking on: February 22, 2023   pantoprazole  40 MG tablet Commonly known as: PROTONIX  Take 1 tablet (40 mg total)  by mouth daily. What changed: when to take this   potassium chloride  10 MEQ tablet Commonly known as: KLOR-CON  Take 10 mEq by mouth daily.   pregabalin  150 MG capsule Commonly known as: Lyrica  Take 1 capsule (150 mg total) by mouth 2 (two) times daily.   PRENATAL PO Take 1 tablet by mouth daily.   promethazine  25 MG tablet Commonly known as: PHENERGAN  Take 1 tablet (25 mg total) by mouth every 6 (six) hours as needed for nausea or vomiting.   VITAMIN B-12 PO Take 2,000 mcg by mouth daily.   vitamin C  1000 MG tablet Take 1,000 mg by mouth daily.   zinc  gluconate 50 MG tablet Take 50 mg by mouth daily.        Discharge Assessment: Vitals:   02/21/23 1300 02/21/23 1500  BP: 136/65 134/66  Pulse: 96 90  Resp: (!) 21 16  Temp:    SpO2: 97% 98%   Skin clean, dry and intact without evidence of skin break down, no evidence of skin tears noted. IV catheter discontinued intact. Site without signs and symptoms of complications - no redness or edema noted at insertion site, patient denies c/o pain - only slight tenderness at site.  Dressing with slight pressure applied.  D/c Instructions-Education: Discharge instructions given to patient/family with verbalized understanding. D/c education completed with patient/family including follow up instructions, medication list, d/c activities limitations if indicated,  with other d/c instructions as indicated by MD - patient able to verbalize understanding, all questions fully answered. Patient instructed to return to ED, call 911, or call MD for any changes in condition.  Patient escorted via WC, and D/C home via private auto.  Laymon JONELLE Hammonds, RN 02/21/2023 3:41 PM

## 2023-02-21 NOTE — Discharge Instructions (Addendum)
 Please take all prescribed medications exactly as instructed including: Taking Protonix  40 mg by mouth once daily for your acid reflux. Taking losartan  50 mg by mouth daily, hydralazine  25 mg by mouth twice daily and amlodipine  10 mg daily for your blood pressure Stop taking your Eliquis  for your diagnosis of blood clot Please consume a low-sodium diet Please increase your physical activity as tolerated using an assistive device or with assistance. Please maintain all outpatient follow-up appointments including follow-up with your primary care provider. Please return to the emergency department if you develop worsening pain, blood in your stool, weakness, chest pain, shortness of breath or inability to tolerate oral intake.

## 2023-02-21 NOTE — Anesthesia Postprocedure Evaluation (Signed)
 Anesthesia Post Note  Patient: Tammy [name]  Procedure(s) Performed: ESOPHAGOGASTRODUODENOSCOPY (EGD)     Patient location during evaluation: PACU Anesthesia Type: MAC Level of consciousness: awake and alert Pain management: pain level controlled Vital Signs Assessment: post-procedure vital signs reviewed and stable Respiratory status: spontaneous breathing, nonlabored ventilation, respiratory function stable and patient connected to nasal cannula oxygen Cardiovascular status: stable and blood pressure returned to baseline Postop Assessment: no apparent nausea or vomiting Anesthetic complications: no  No notable events documented.  Last Vitals:  Vitals:   02/21/23 1040 02/21/23 1046  BP: (!) 151/77 (!) 145/73  Pulse: 78 80  Resp: 20 19  Temp:    SpO2: 94% 95%    Last Pain:  Vitals:   02/21/23 1046  TempSrc:   PainSc: 0-No pain                 Epifanio Lamar BRAVO

## 2023-02-22 ENCOUNTER — Encounter (HOSPITAL_COMMUNITY): Payer: Self-pay | Admitting: Internal Medicine

## 2023-02-22 LAB — BPAM RBC
Blood Product Expiration Date: 202502032359
Blood Product Expiration Date: 202502072359
ISSUE DATE / TIME: 202501111450
ISSUE DATE / TIME: 202501111732
Unit Type and Rh: 6200
Unit Type and Rh: 6200

## 2023-02-22 LAB — TYPE AND SCREEN
ABO/RH(D): AB POS
Antibody Screen: NEGATIVE
Unit division: 0
Unit division: 0

## 2023-02-23 ENCOUNTER — Ambulatory Visit (INDEPENDENT_AMBULATORY_CARE_PROVIDER_SITE_OTHER): Payer: Medicare Other | Admitting: Physical Therapy

## 2023-02-23 ENCOUNTER — Encounter: Payer: Self-pay | Admitting: Physical Therapy

## 2023-02-23 DIAGNOSIS — R2681 Unsteadiness on feet: Secondary | ICD-10-CM

## 2023-02-23 DIAGNOSIS — M6281 Muscle weakness (generalized): Secondary | ICD-10-CM

## 2023-02-23 DIAGNOSIS — R2689 Other abnormalities of gait and mobility: Secondary | ICD-10-CM | POA: Diagnosis not present

## 2023-02-23 NOTE — Therapy (Signed)
 OUTPATIENT PHYSICAL THERAPY PROSTHETIC TREATMENT   Patient Name: Tammy [name] MRN: [id number] DOB:1955/04/23, 68 y.o., female Today's Date: 02/23/2023  END OF SESSION:  PT End of Session - 02/23/23 1023     Visit Number 5    Number of Visits 27    Date for PT Re-Evaluation 04/22/23    Authorization Type [employer]    Authorization Time Period $20 COPAY 16 PT VISITS APPROVED 12/16-2/11/2023    Authorization - Visit Number 5    Authorization - Number of Visits 16    Progress Note Due on Visit 10    PT Start Time 1021    PT Stop Time 1100    PT Time Calculation (min) 39 min    Equipment Utilized During Treatment Gait belt    Activity Tolerance Patient tolerated treatment well;Patient limited by fatigue    Behavior During Therapy WFL for tasks assessed/performed                 Past Medical History:  Diagnosis Date   Anemia    hx of   Anxiety    on meds   Arthritis    back/bilateral ankles   Bipolar disorder (HCC)    Breast cancer (HCC) 2022   LEFT breast-radiation   Depression    on meds   Family history of breast cancer 07/10/2020   Family history of colon cancer 07/10/2020   Family history of ovarian cancer 07/10/2020   GERD (gastroesophageal reflux disease)    on meds   Heart murmur    dx by PCP   Hepatitis C    HTN (hypertension)    on meds   Hx of radiation therapy    Liver cirrhosis (HCC)    from hep c   Neuromuscular disorder (HCC)    Osteopenia    Personal history of radiation therapy    Stroke (HCC) 09/2012   slurred speech and LEFT sided weakness/notes 10/04/2012   Past Surgical History:  Procedure Laterality Date   AMPUTATION Left 11/04/2022   Procedure: LEFT ABOVE KNEE AMPUTATION;  Surgeon: Harden Jerona GAILS, MD;  Location: [facility] OR;  Service: Orthopedics;  Laterality: Left;   ANKLE FRACTURE SURGERY Left 2018   APPLICATION OF WOUND VAC Left 06/15/2022   Procedure: APPLICATION OF WOUND VAC;  Surgeon: Lowery Estefana RAMAN, DO;   Location: [facility];  Service: Plastics;  Laterality: Left;   APPLICATION OF WOUND VAC Left 09/14/2022   Procedure: APPLICATION OF WOUND VAC;  Surgeon: Lowery Estefana RAMAN, DO;  Location: [facility] OR;  Service: Plastics;  Laterality: Left;   BREAST LUMPECTOMY Left 07/2020   BREAST LUMPECTOMY WITH RADIOACTIVE SEED AND SENTINEL LYMPH NODE BIOPSY Left 07/26/2020   Procedure: LEFT BREAST LUMPECTOMY WITH RADIOACTIVE SEED AND SENTINEL LYMPH NODE BIOPSY;  Surgeon: Vernetta Berg, MD;  Location: [facility];  Service: General;  Laterality: Left;   CESAREAN SECTION  1986   COLONOSCOPY     DIAGNOSTIC LAPAROSCOPY  1985   ESOPHAGOGASTRODUODENOSCOPY N/A 02/21/2023   Procedure: ESOPHAGOGASTRODUODENOSCOPY (EGD);  Surgeon: Abran Norleen SAILOR, MD;  Location: [facility];  Service: Gastroenterology;  Laterality: N/A;   HARDWARE REMOVAL Left 04/30/2022   Procedure: HARDWARE REMOVAL LEFT FEMUR;  Surgeon: Fidel Rogue, MD;  Location: [facility];  Service: Orthopedics;  Laterality: Left;  150   HARDWARE REMOVAL Left 11/04/2022   Procedure: HARDWARE REMOVAL INFECTED LEFT TOTAL KNEE ARTHROPLASTY;  Surgeon: Harden Jerona GAILS, MD;  Location: [facility] OR;  Service: Orthopedics;  Laterality: Left;  I & D KNEE WITH POLY EXCHANGE Left 06/07/2022   Procedure: IRRIGATION AND DEBRIDEMENT KNEE WITH POLY EXCHANGE;  Surgeon: Fidel Rogue, MD;  Location: [facility];  Service: Orthopedics;  Laterality: Left;   INCISION AND DRAINAGE OF WOUND Left 06/04/2022   Procedure: IRRIGATION AND DEBRIDEMENT KNEE;  Surgeon: Fidel Rogue, MD;  Location: [facility];  Service: Orthopedics;  Laterality: Left;  60   INCISION AND DRAINAGE OF WOUND Left 06/15/2022   Procedure: excision of knee wound with Myriad;  Surgeon: Lowery Estefana RAMAN, DO;  Location: [facility];  Service: Plastics;  Laterality: Left;   ORIF FEMUR FRACTURE Left 04/10/2021   Procedure: OPEN REDUCTION INTERNAL FIXATION (ORIF) DISTAL FEMUR FRACTURE;  Surgeon: Celena Sharper, MD;  Location: [facility]  OR;  Service: Orthopedics;  Laterality: Left;   PARTIAL KNEE ARTHROPLASTY Left 04/30/2022   Procedure: DISTAL FEMUR REPLACEMENT;  Surgeon: Fidel Rogue, MD;  Location: [facility];  Service: Orthopedics;  Laterality: Left;  150   Patient Active Problem List   Diagnosis Date Noted   Left femoral vein DVT (HCC) 02/20/2023   GERD (gastroesophageal reflux disease) 02/20/2023   Acute upper gastrointestinal bleeding 02/19/2023   Bipolar 1 disorder, depressed, partial remission (HCC) 11/18/2022   Left above-knee amputee (HCC) 11/13/2022   Infection of total knee replacement (HCC) 11/04/2022   S/P AKA (above knee amputation) unilateral, left (HCC) 11/04/2022   Chronic hepatitis C with cirrhosis (HCC) 06/06/2022   Surgical wound dehiscence, initial encounter 06/04/2022   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, left, with nonunion, subsequent encounter 04/30/2022   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 04/30/2022   Hypokalemia 04/16/2021   Hip fracture requiring operative repair (HCC) 04/16/2021   Vitamin D  deficiency 04/12/2021   Hyponatremia 04/11/2021   Acute postoperative anemia due to expected blood loss 04/11/2021   Class 1 obesity 04/11/2021   Left comminuted and displaced fracture of the distal femur metaphysis 04/08/2021   History of CVA (cerebrovascular accident) 04/08/2021   Genetic testing 07/17/2020   Family history of breast cancer 07/10/2020   Family history of ovarian cancer 07/10/2020   Family history of colon cancer 07/10/2020   Malignant neoplasm of upper-outer quadrant of left breast in female, estrogen receptor positive (HCC) 07/09/2020   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   Chronic pain syndrome 10/06/2012   Nicotine  dependence 10/06/2012   Anxiety state 10/05/2012   Bipolar disorder, unspecified (HCC) 10/05/2012   Slurred speech 10/04/2012   Left-sided weakness 10/04/2012   Hyperkalemia  10/04/2012   Acute CVA (cerebrovascular accident) (HCC) 10/04/2012   Essential hypertension     PCP: Norval Kettle, MD  REFERRING PROVIDER: Jerona Sage, MD  ONSET DATE: 01/25/2023 prosthesis delivery  REFERRING DIAG: S10.387 (ICD-10-CM) - Hx of AKA (above knee amputation), left   THERAPY DIAG:  Other abnormalities of gait and mobility  Unsteadiness on feet  Muscle weakness (generalized)  Rationale for Evaluation and Treatment: Rehabilitation  SUBJECTIVE:   SUBJECTIVE STATEMENT: She started feeling bad on Christmas day.  She developed coffee ground emesis.  PT advised to call doctor ASAP when spoke on 1/9 which she did. Patient was hospitalized 02/19/2023 - 02/21/2023 with acute upper GI bleeding with Esophagogastoduodenoscope diagnosing hiatal hernia with esophageal stricture.  DC orders say to resume activities and outpatient care.   Pt accompanied by: significant other  PERTINENT HISTORY: AKA, anemia, anxiety, arthritis, Bipolar disorder, Breast CA left with radiation, HTN, Hep C, liver cirrhosis, Neuromuscular disorder, osteopenia, stroke Left hemiparesis 2014  PAIN:  Are you having pain? Yes: NPRS scale: ranges from  0/10 - 3/10 Pain location: right knee in joint Pain description: throbbing Aggravating factors: standing or walking too much, or twisting motion Relieving factors: meds or medicated lotions  PRECAUTIONS: Fall  WEIGHT BEARING RESTRICTIONS: No  FALLS: Has patient fallen in last 6 months? Yes. Number of falls 1 no injuries  LIVING ENVIRONMENT: Lives with: lives with their spouse Lives in: Mobile home Home Access: Ramped entrance or 4 steps with double rail to [employer] layout: One level Has following equipment at home: Single point cane, Environmental Consultant - 2 wheeled, Wheelchair (manual), Shower bench, bed side commode, Grab bars, and Ramped entry  OCCUPATION: retired from nursing  PLOF: Independent prior to Fracture 04/10/2021.  Since fracture was able to get to  cane for community until hardware broke.    PATIENT GOALS:  walk independently in community, travel, drive, take care of 4yo great granddaughter  OBJECTIVE:  COGNITION: Overall cognitive status: Within functional limits for tasks assessed  POSTURE: rounded shoulders, forward head, flexed trunk , and weight shift right  LOWER EXTREMITY ROM:  ROM P:passive  A:active Right eval Left eval  Hip flexion    Hip extension    Hip abduction    Hip adduction    Hip internal rotation    Hip external rotation    Knee flexion    Knee extension    Ankle dorsiflexion    Ankle plantarflexion    Ankle inversion    Ankle eversion     (Blank rows = not tested)  LOWER EXTREMITY MMT:  MMT Right eval Left eval  Hip flexion    Hip extension    Hip abduction    Hip adduction    Hip internal rotation    Hip external rotation    Knee flexion    Knee extension    Ankle dorsiflexion    Ankle plantarflexion    Ankle inversion    Ankle eversion    At Evaluation all strength testing is grossly seated and functionally standing / gait. (Blank rows = not tested)  TRANSFERS: Evaluation on 01/25/2023:  Sit to stand: SBA w/c to RW requires armrests on w/c and stabilization with RW.   Stand to sit: SBA RW to w/c requires armrests on w/c and stabilization with RW.    FUNCTIONAL TESTs:  Lars Balance Scale: 15/56    GAIT: 01/26/2023: Patient ambulated 10 feet with rolling walker with minA but required constant cueing for technique and safety.  CURRENT PROSTHETIC WEAR ASSESSMENT: Evaluation on 01/25/2023:  Patient is dependent with: skin check, residual limb care, care of non-amputated limb, prosthetic cleaning, ply sock cleaning, correct ply sock adjustment, proper wear schedule/adjustment, and proper weight-bearing schedule/adjustment Donning prosthesis: Min A Doffing prosthesis: SBA Prosthetic wear tolerance: tolerated wear 1 hour prior to PT evaluation.  Prosthetic weight bearing  tolerance: 5 minutes with BUE support on RW with limited wear on prosthesis.  Edema: pitting Prosthetic description: Ischial Containment socket with flexible inner socket, silicon liner with velcro strap suspension.     TODAY'S TREATMENT:  DATE:  02/23/2023: Prosthetic Training with Transfemoral Prosthesis: RHR 77 SpO2 99%  after standing to don prosthesis HR 99 SpO2 97% with ShOB PT reviewed how to tighten strap.  Pt verbalized understanding.  Pt amb 20' with RW with CGA / verbal cues on heel contact, weight shift over prosthesis and upright posture. HR 73 SpO2 97% PT reviewed step length, step through pattern and weight shift. Pt amb 34' with RW with CGA. HR 81 SpO2 98% PT demo & verbal cues on negotiating curb. Pt neg 6.5 with RW with CGA.  SpO2 98% HR 78 Pt amb 40' with RW with CGA.     TREATMENT:                                                                                                                             DATE:  02/01/2023: Prosthetic Training with Transfemoral Prosthesis: Pt arrived with liner & socks properly donned.  She donned the prosthesis including tightening the suspension strap with minA / supervision.  Pt recommended increasing wear to 3hrs 3x/day with liner & prosthesis off for 2 hours between wears.  Wear shrinker at all times prosthesis is off.  PT educated on rationale for building up to wear all awake hours. PT also reviewed sitting with folded towel or pillow under right side to level pelvis and placing cloth or hand under stomach so socket does not rub.  Pt verbalized understanding.  PT demo & verbal sit to/from stand from 18 chair without armrests using RUE to push on chair seat & RW for stabilization. Pt performed 2 reps with minA 1st rep & CGA 2nd rep.   Pt amb 20' & 25' (max tolerable until she fatigued) with RW with supervision.  PT  demo & verbal cues how to follow with w/c to enable supervision / assist as needed & ability to sit when tired. Pt & fiance verbalized understanding.  PT demo & verbal cues on negotiating curb with RW & TFA prosthesis.  Pt neg 6.5 curb with RW with minA & constant verbal cues.      TREATMENT:                                                                                                                             DATE:  01/28/2023: Prosthetic Training with Transfemoral Prosthesis: Patient still has 6 small blisters 41mm-5mm distal limb around incision.  All are fluid filled  with no signs of infection. Continue recommendation for wear 2 hours 2-3 times per day.  Pt verbalized understanding.  PT verbal cues on recommendations for using a toilet in public without prosthesis.  Pt verbalized understanding and thinks it will help.  Pt donned liner & prosthesis (including tightening suspension strap)  with supervision / verbal cues and intermittent minA.   Sit to/from stand w/c & 18 chair pushing off seat to RW with verbal cues / supervision.   Pt amb 20' X 2 including turning 90* & 180* to position in front of chair with RW with minA & verbal cues on technique.  PT verbal cues to pt & her fiance on safe assistance and how to determine 20' from chair.  Both verbalized understanding and comfortable beginning to work on basic / limited gait at home.    TREATMENT:                                                                                                                             DATE:  01/26/2023: Prosthetic Training with Transfemoral Prosthesis: Patient has 6 small blisters 87mm-5mm distal limb around incision.  All are fluid filled with no signs of infection.  PT re-assessed at end of session with 50 min of activities with no change in blisters.  Blisters appear to be from air pocket inside the liner and wear longer than indicated for initial wear. PT verbal with some demo educated pt & her  fiance, Lyn[place], on cleaning liner, positioning in sitting with prosthetic foot in contact with floor or support surface, using folded towel under left pelvis to level pelvis for sitting >5-10 minutes, sweating with amputation/prosthesis and use of antiperspirant on limb.  Toileting managing pants with single UE RW support, sit to/from stand and positioning to decrease chance of urine or BM getting on prosthetic liner.  PT recommending initial wear to be 1 hour 3 times per day.  Patient should remove liner and prosthesis between wears for 2 or more hours.  Pt & fiance verbalized understanding.   PT demo and verbal cues on proper donning of liner and prosthesis including tightening strap in standing.  Patient and fianc verbalized understanding. PT demo and verbal cues on sit to / from stand technique with proper prosthetic control.  Patient able to stand from 20  wheelchair using armrest to rolling walker with supervision and verbal cues.  Patient able to sit down with supervision and verbal cues.  Patient's fianc videotaped patient performing activity with PT directions.  Patient to review this video multiple times a day to get the techniques that in her mind.  Patient verbalized understanding PT demo and verbal cues on gait with rolling walker and prosthesis.  Patient ambulated 10 feet with rolling walker with minA but required constant cueing for technique and safety.    HOME EXERCISE PROGRAM:  ASSESSMENT:  CLINICAL IMPRESSION: Patient required more frequent rests but tolerated overall session well.  PT reviewed tightening  strap which she verbalized understanding need & how.  Patient improved gait during session with instruction and repetition.    Patient continues to benefit from skilled PT.    OBJECTIVE IMPAIRMENTS: Abnormal gait, decreased activity tolerance, decreased balance, decreased endurance, decreased knowledge of condition, decreased knowledge of use of DME, decreased mobility, difficulty  walking, decreased ROM, decreased strength, increased edema, impaired flexibility, postural dysfunction, prosthetic dependency , obesity, and pain.   ACTIVITY LIMITATIONS: carrying, lifting, bending, sitting, standing, stairs, transfers, and locomotion level  PARTICIPATION LIMITATIONS: meal prep, cleaning, and community activity  PERSONAL FACTORS: Fitness, Time since onset of injury/illness/exacerbation, and 3+ comorbidities: see PMH  are also affecting patient's functional outcome.   REHAB POTENTIAL: Good  CLINICAL DECISION MAKING: Evolving/moderate complexity  EVALUATION COMPLEXITY: Moderate   GOALS: Goals reviewed with patient? Yes  SHORT TERM GOALS: Target date: 02/25/2023  Patient donnes prosthesis modified independent & verbalizes proper cleaning. Baseline: SEE OBJECTIVE DATA Goal status: Ongoing    02/09/2023 2.  Patient tolerates prosthesis >10 hrs total /day without skin issues or limb pain after standing. Baseline: SEE OBJECTIVE DATA Goal status: Ongoing    02/09/2023  3.  Berg Balance >20/56 Baseline: SEE OBJECTIVE DATA Goal status: Ongoing    02/09/2023  4. Patient ambulates 86' with RW & prosthesis with supervision. Baseline: SEE OBJECTIVE DATA Goal status: Ongoing    02/09/2023  5. Patient negotiates ramps & curbs with RW & prosthesis with minA. Baseline: SEE OBJECTIVE DATA Goal status: Ongoing    02/09/2023  LONG TERM GOALS: Target date: 04/22/2023  Patient demonstrates & verbalized understanding of prosthetic care to enable safe utilization of prosthesis. Baseline: SEE OBJECTIVE DATA Goal status: Ongoing    02/09/2023  Patient tolerates prosthesis wear >90% of awake hours without skin or residual limb pain issues. Baseline: SEE OBJECTIVE DATA Goal status: Ongoing    02/09/2023  Berg Balance >35/56 to indicate lower fall risk Baseline: SEE OBJECTIVE DATA Goal status: Ongoing  01/28/2023  Patient ambulates >300' with prosthesis and cane or less  modified independent. Baseline: SEE OBJECTIVE DATA Goal status: Ongoing   02/09/2023  Patient negotiates ramps, curbs & stairs with single rail with prosthesis  and cane or less modified independent. Baseline: SEE OBJECTIVE DATA Goal status: Ongoing   02/09/2023   Patient reports right knee pain </= 2/10 with standing and gait activities.   Baseline: SEE OBJECTIVE DATA Goal status:   Ongoing  02/09/2023  PLAN:  PT FREQUENCY:  3 x for 1 week, then 2x/week  PT DURATION: 90 days / 13 weeks  PLANNED INTERVENTIONS: 97164- PT Re-evaluation, 97110-Therapeutic exercises, 97530- Therapeutic activity, 97112- Neuromuscular re-education, [phone]- Self Care, [id number]- Gait training, [phone]- Prosthetic training, Patient/Family education, Balance training, Stair training, DME instructions, and physical performance testing  PLAN FOR NEXT SESSION:   check STGs,    ask about skin & donning, work on sit to/from stand and gait with rolling walker including curb.  Weather permitting try car transfer.    Jaiyah Beining, PT, DPT 02/23/2023, 4:42 PM

## 2023-02-25 ENCOUNTER — Ambulatory Visit: Payer: Medicare Other | Admitting: Physical Therapy

## 2023-02-25 ENCOUNTER — Encounter: Payer: Self-pay | Admitting: Physical Therapy

## 2023-02-25 DIAGNOSIS — M25561 Pain in right knee: Secondary | ICD-10-CM

## 2023-02-25 DIAGNOSIS — M6281 Muscle weakness (generalized): Secondary | ICD-10-CM

## 2023-02-25 DIAGNOSIS — G8929 Other chronic pain: Secondary | ICD-10-CM

## 2023-02-25 DIAGNOSIS — R2689 Other abnormalities of gait and mobility: Secondary | ICD-10-CM | POA: Diagnosis not present

## 2023-02-25 DIAGNOSIS — R2681 Unsteadiness on feet: Secondary | ICD-10-CM

## 2023-02-25 NOTE — Therapy (Signed)
OUTPATIENT PHYSICAL THERAPY PROSTHETIC TREATMENT   Patient Name: Tammy [name] MRN: [id number] DOB:November 17, 1955, 68 y.o., female Today's Date: 02/25/2023  END OF SESSION:  PT End of Session - 02/25/23 1033     Visit Number 6    Number of Visits 27    Date for PT Re-Evaluation 04/22/23    Authorization Type [employer]    Authorization Time Period $20 COPAY 16 PT VISITS APPROVED 12/16-2/11/2023    Authorization - Visit Number 6    Authorization - Number of Visits 16    Progress Note Due on Visit 10    PT Start Time 1018    PT Stop Time 1111    PT Time Calculation (min) 53 min    Equipment Utilized During Treatment Gait belt    Activity Tolerance Patient tolerated treatment well;Patient limited by fatigue    Behavior During Therapy WFL for tasks assessed/performed                  Past Medical History:  Diagnosis Date   Anemia    hx of   Anxiety    on meds   Arthritis    back/bilateral ankles   Bipolar disorder (HCC)    Breast cancer (HCC) 2022   LEFT breast-radiation   Depression    on meds   Family history of breast cancer 07/10/2020   Family history of colon cancer 07/10/2020   Family history of ovarian cancer 07/10/2020   GERD (gastroesophageal reflux disease)    on meds   Heart murmur    dx by PCP   Hepatitis C    HTN (hypertension)    on meds   Hx of radiation therapy    Liver cirrhosis (HCC)    from hep c   Neuromuscular disorder (HCC)    Osteopenia    Personal history of radiation therapy    Stroke (HCC) 09/2012   slurred speech and LEFT sided weakness/notes 10/04/2012   Past Surgical History:  Procedure Laterality Date   AMPUTATION Left 11/04/2022   Procedure: LEFT ABOVE KNEE AMPUTATION;  Surgeon: Nadara Mustard, MD;  Location: [facility] OR;  Service: Orthopedics;  Laterality: Left;   ANKLE FRACTURE SURGERY Left 2018   APPLICATION OF WOUND VAC Left 06/15/2022   Procedure: APPLICATION OF WOUND VAC;  Surgeon: Peggye Form, DO;   Location: [facility];  Service: Plastics;  Laterality: Left;   APPLICATION OF WOUND VAC Left 09/14/2022   Procedure: APPLICATION OF WOUND VAC;  Surgeon: Peggye Form, DO;  Location: [facility] OR;  Service: Plastics;  Laterality: Left;   BREAST LUMPECTOMY Left 07/2020   BREAST LUMPECTOMY WITH RADIOACTIVE SEED AND SENTINEL LYMPH NODE BIOPSY Left 07/26/2020   Procedure: LEFT BREAST LUMPECTOMY WITH RADIOACTIVE SEED AND SENTINEL LYMPH NODE BIOPSY;  Surgeon: Abigail Miyamoto, MD;  Location: [facility];  Service: General;  Laterality: Left;   CESAREAN SECTION  1986   COLONOSCOPY     DIAGNOSTIC LAPAROSCOPY  1985   ESOPHAGOGASTRODUODENOSCOPY N/A 02/21/2023   Procedure: ESOPHAGOGASTRODUODENOSCOPY (EGD);  Surgeon: Hilarie Fredrickson, MD;  Location: [facility];  Service: Gastroenterology;  Laterality: N/A;   HARDWARE REMOVAL Left 04/30/2022   Procedure: HARDWARE REMOVAL LEFT FEMUR;  Surgeon: Samson Frederic, MD;  Location: [facility];  Service: Orthopedics;  Laterality: Left;  150   HARDWARE REMOVAL Left 11/04/2022   Procedure: HARDWARE REMOVAL INFECTED LEFT TOTAL KNEE ARTHROPLASTY;  Surgeon: Nadara Mustard, MD;  Location: [facility] OR;  Service: Orthopedics;  Laterality: Left;  I & D KNEE WITH POLY EXCHANGE Left 06/07/2022   Procedure: IRRIGATION AND DEBRIDEMENT KNEE WITH POLY EXCHANGE;  Surgeon: Samson Frederic, MD;  Location: [facility];  Service: Orthopedics;  Laterality: Left;   INCISION AND DRAINAGE OF WOUND Left 06/04/2022   Procedure: IRRIGATION AND DEBRIDEMENT KNEE;  Surgeon: Samson Frederic, MD;  Location: [facility];  Service: Orthopedics;  Laterality: Left;  60   INCISION AND DRAINAGE OF WOUND Left 06/15/2022   Procedure: excision of knee wound with Myriad;  Surgeon: Peggye Form, DO;  Location: [facility];  Service: Plastics;  Laterality: Left;   ORIF FEMUR FRACTURE Left 04/10/2021   Procedure: OPEN REDUCTION INTERNAL FIXATION (ORIF) DISTAL FEMUR FRACTURE;  Surgeon: Myrene Galas, MD;  Location: [facility]  OR;  Service: Orthopedics;  Laterality: Left;   PARTIAL KNEE ARTHROPLASTY Left 04/30/2022   Procedure: DISTAL FEMUR REPLACEMENT;  Surgeon: Samson Frederic, MD;  Location: [facility];  Service: Orthopedics;  Laterality: Left;  150   Patient Active Problem List   Diagnosis Date Noted   Left femoral vein DVT (HCC) 02/20/2023   GERD (gastroesophageal reflux disease) 02/20/2023   Acute upper gastrointestinal bleeding 02/19/2023   Bipolar 1 disorder, depressed, partial remission (HCC) 11/18/2022   Left above-knee amputee (HCC) 11/13/2022   Infection of total knee replacement (HCC) 11/04/2022   S/P AKA (above knee amputation) unilateral, left (HCC) 11/04/2022   Chronic hepatitis C with cirrhosis (HCC) 06/06/2022   Surgical wound dehiscence, initial encounter 06/04/2022   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, left, with nonunion, subsequent encounter 04/30/2022   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 04/30/2022   Hypokalemia 04/16/2021   Hip fracture requiring operative repair (HCC) 04/16/2021   Vitamin D deficiency 04/12/2021   Hyponatremia 04/11/2021   Acute postoperative anemia due to expected blood loss 04/11/2021   Class 1 obesity 04/11/2021   Left comminuted and displaced fracture of the distal femur metaphysis 04/08/2021   History of CVA (cerebrovascular accident) 04/08/2021   Genetic testing 07/17/2020   Family history of breast cancer 07/10/2020   Family history of ovarian cancer 07/10/2020   Family history of colon cancer 07/10/2020   Malignant neoplasm of upper-outer quadrant of left breast in female, estrogen receptor positive (HCC) 07/09/2020   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   Chronic pain syndrome 10/06/2012   Nicotine dependence 10/06/2012   Anxiety state 10/05/2012   Bipolar disorder, unspecified (HCC) 10/05/2012   Slurred speech 10/04/2012   Left-sided weakness 10/04/2012   Hyperkalemia  10/04/2012   Acute CVA (cerebrovascular accident) (HCC) 10/04/2012   Essential hypertension     PCP: Quitman Livings, MD  REFERRING PROVIDER: Aldean Baker, MD  ONSET DATE: 01/25/2023 prosthesis delivery  REFERRING DIAG: N36.144 (ICD-10-CM) - Hx of AKA (above knee amputation), left   THERAPY DIAG:  Other abnormalities of gait and mobility  Unsteadiness on feet  Muscle weakness (generalized)  Chronic pain of right knee  Rationale for Evaluation and Treatment: Rehabilitation  SUBJECTIVE:   SUBJECTIVE STATEMENT: She is slowly gaining strength since hospitalization. She wore prosthesis most of awake hours except ~3 hours midday yesterday.   Pt accompanied by: significant other  PERTINENT HISTORY: AKA, anemia, anxiety, arthritis, Bipolar disorder, Breast CA left with radiation, HTN, Hep C, liver cirrhosis, Neuromuscular disorder, osteopenia, stroke Left hemiparesis 2014  PAIN:  Are you having pain? Yes: NPRS scale: over last week ranges from 0/10 - 6/10 (worse since off prosthesis for several days) Pain location: right knee in joint Pain  description: throbbing Aggravating factors: standing or walking too much, or twisting motion Relieving factors: meds or medicated lotions  PRECAUTIONS: Fall  WEIGHT BEARING RESTRICTIONS: No  FALLS: Has patient fallen in last 6 months? Yes. Number of falls 1 no injuries  LIVING ENVIRONMENT: Lives with: lives with their spouse Lives in: Mobile home Home Access: Ramped entrance or 4 steps with double rail to [employer] layout: One level Has following equipment at home: Single point cane, Environmental consultant - 2 wheeled, Wheelchair (manual), Shower bench, bed side commode, Grab bars, and Ramped entry  OCCUPATION: retired from nursing  PLOF: Independent prior to Fracture 04/10/2021.  Since fracture was able to get to cane for community until hardware broke.    PATIENT GOALS:  walk independently in community, travel, drive, take care of 4yo great  granddaughter  OBJECTIVE:  COGNITION: Overall cognitive status: Within functional limits for tasks assessed  POSTURE: rounded shoulders, forward head, flexed trunk , and weight shift right  LOWER EXTREMITY ROM:  ROM P:passive  A:active Right eval Left eval  Hip flexion    Hip extension    Hip abduction    Hip adduction    Hip internal rotation    Hip external rotation    Knee flexion    Knee extension    Ankle dorsiflexion    Ankle plantarflexion    Ankle inversion    Ankle eversion     (Blank rows = not tested)  LOWER EXTREMITY MMT:  MMT Right eval Left eval  Hip flexion    Hip extension    Hip abduction    Hip adduction    Hip internal rotation    Hip external rotation    Knee flexion    Knee extension    Ankle dorsiflexion    Ankle plantarflexion    Ankle inversion    Ankle eversion    At Evaluation all strength testing is grossly seated and functionally standing / gait. (Blank rows = not tested)  TRANSFERS: Evaluation on 01/25/2023:  Sit to stand: SBA w/c to RW requires armrests on w/c and stabilization with RW.   Stand to sit: SBA RW to w/c requires armrests on w/c and stabilization with RW.    FUNCTIONAL TESTs:  Sharlene Motts Balance Scale: 15/56    GAIT: 01/26/2023: Patient ambulated 10 feet with rolling walker with minA but required constant cueing for technique and safety.  CURRENT PROSTHETIC WEAR ASSESSMENT: Evaluation on 01/25/2023:  Patient is dependent with: skin check, residual limb care, care of non-amputated limb, prosthetic cleaning, ply sock cleaning, correct ply sock adjustment, proper wear schedule/adjustment, and proper weight-bearing schedule/adjustment Donning prosthesis: Min A Doffing prosthesis: SBA Prosthetic wear tolerance: tolerated wear 1 hour prior to PT evaluation.  Prosthetic weight bearing tolerance: 5 minutes with BUE support on RW with limited wear on prosthesis.  Edema: pitting Prosthetic description: Ischial Containment  socket with flexible inner socket, silicon liner with velcro strap suspension.     TODAY'S TREATMENT:  DATE:  02/25/2023: Prosthetic Training with Transfemoral Prosthesis: RHR 82 SpO2 97%  Pt donned prosthesis correctly except reminder for foot position to facilitate knee stability with pulling on suspension strap.  Pt verbalized proper cleaning.  Pt amb 40' with RW with SBA. Sit to/from stand chair without armrests using UE to RW with CGA.  Pt neg 6.5" curb with RW after review demo / verbal cues with Min/CGA.  PT demo & verbal cues on neg ramp with RW.  Pt neg 12* ramp with RW with modA. Pt amb 40' with RW with SBA.  PT demo & verbal cues on car transfer.  She neg 4" curb outside with RW with SBA.  Pt able to transfer in & out of her Zenaida Niece passenger seat using RW with cues.        TREATMENT:                                                                                                                             DATE:  02/23/2023: Prosthetic Training with Transfemoral Prosthesis: RHR 77 SpO2 99%  after standing to don prosthesis HR 99 SpO2 97% with ShOB PT reviewed how to tighten strap.  Pt verbalized understanding.  Pt amb 20' with RW with CGA / verbal cues on heel contact, weight shift over prosthesis and upright posture. HR 73 SpO2 97% PT reviewed step length, step through pattern and weight shift. Pt amb 45' with RW with CGA. HR 81 SpO2 98% PT demo & verbal cues on negotiating curb. Pt neg 6.5" with RW with CGA.  SpO2 98% HR 78 Pt amb 40' with RW with CGA.     TREATMENT:                                                                                                                             DATE:  02/01/2023: Prosthetic Training with Transfemoral Prosthesis: Pt arrived with liner & socks properly donned.  She donned the prosthesis including tightening the  suspension strap with minA / supervision.  Pt recommended increasing wear to 3hrs 3x/day with liner & prosthesis off for 2 hours between wears.  Wear shrinker at all times prosthesis is off.  PT educated on rationale for building up to wear all awake hours. PT also reviewed sitting with folded towel or pillow under right side to level pelvis and placing cloth or hand under stomach so socket does not rub.  Pt verbalized understanding.  PT demo & verbal sit to/from stand from 18" chair without armrests using RUE to push on chair seat & RW for stabilization. Pt performed 2 reps with minA 1st rep & CGA 2nd rep.   Pt amb 20' & 25' (max tolerable until she fatigued) with RW with supervision.  PT demo & verbal cues how to follow with w/c to enable supervision / assist as needed & ability to sit when tired. Pt & fiance verbalized understanding.  PT demo & verbal cues on negotiating curb with RW & TFA prosthesis.  Pt neg 6.5" curb with RW with minA & constant verbal cues.      HOME EXERCISE PROGRAM:  ASSESSMENT:  CLINICAL IMPRESSION: Patient appears able to get in & out of car with her prosthesis which should allow her to wear her prosthesis to PT.  She improved gait on level surfaces and curb.  PT introduced ramp which will require more work prior to trying outside of PT.   Patient continues to benefit from skilled PT.    OBJECTIVE IMPAIRMENTS: Abnormal gait, decreased activity tolerance, decreased balance, decreased endurance, decreased knowledge of condition, decreased knowledge of use of DME, decreased mobility, difficulty walking, decreased ROM, decreased strength, increased edema, impaired flexibility, postural dysfunction, prosthetic dependency , obesity, and pain.   ACTIVITY LIMITATIONS: carrying, lifting, bending, sitting, standing, stairs, transfers, and locomotion level  PARTICIPATION LIMITATIONS: meal prep, cleaning, and community activity  PERSONAL FACTORS: Fitness, Time since onset of  injury/illness/exacerbation, and 3+ comorbidities: see PMH  are also affecting patient's functional outcome.   REHAB POTENTIAL: Good  CLINICAL DECISION MAKING: Evolving/moderate complexity  EVALUATION COMPLEXITY: Moderate   GOALS: Goals reviewed with patient? Yes  SHORT TERM GOALS: Target date: 02/25/2023  Patient donnes prosthesis modified independent & verbalizes proper cleaning. Baseline: SEE OBJECTIVE DATA Goal status: MET 02/25/2023 2.  Patient tolerates prosthesis >10 hrs total /day without skin issues or limb pain after standing. Baseline: SEE OBJECTIVE DATA Goal status: MET 02/25/2023  3.  Berg Balance >20/56 Baseline: SEE OBJECTIVE DATA Goal status: Ongoing  02/25/2023  4. Patient ambulates 69' with RW & prosthesis with supervision. Baseline: SEE OBJECTIVE DATA Goal status: Ongoing 02/25/2023  5. Patient negotiates ramps & curbs with RW & prosthesis with minA. Baseline: SEE OBJECTIVE DATA Goal status: Ongoing  02/25/2023  LONG TERM GOALS: Target date: 04/22/2023  Patient demonstrates & verbalized understanding of prosthetic care to enable safe utilization of prosthesis. Baseline: SEE OBJECTIVE DATA Goal status: Ongoing    02/09/2023  Patient tolerates prosthesis wear >90% of awake hours without skin or residual limb pain issues. Baseline: SEE OBJECTIVE DATA Goal status: Ongoing    02/09/2023  Berg Balance >35/56 to indicate lower fall risk Baseline: SEE OBJECTIVE DATA Goal status: Ongoing  01/28/2023  Patient ambulates >300' with prosthesis and cane or less modified independent. Baseline: SEE OBJECTIVE DATA Goal status: Ongoing   02/09/2023  Patient negotiates ramps, curbs & stairs with single rail with prosthesis  and cane or less modified independent. Baseline: SEE OBJECTIVE DATA Goal status: Ongoing   02/09/2023   Patient reports right knee pain </= 2/10 with standing and gait activities.   Baseline: SEE OBJECTIVE DATA Goal status:   Ongoing   02/09/2023  PLAN:  PT FREQUENCY:  3 x for 1 week, then 2x/week  PT DURATION: 90 days / 13 weeks  PLANNED INTERVENTIONS: 97164- PT Re-evaluation, 97110-Therapeutic exercises, 97530- Therapeutic activity, [id number]- Neuromuscular re-education, 97535- Self Care, [id number]- Gait training, [phone]- Prosthetic training, Patient/Family  education, Location manager, Stair training, DME instructions, and physical performance testing  PLAN FOR NEXT SESSION:  check Berg Balance,  work on sit to/from stand and gait with rolling walker including curb & ramp. Work on endurance.   Vladimir Faster, PT, DPT 02/25/2023, 11:28 AM

## 2023-03-02 ENCOUNTER — Ambulatory Visit (INDEPENDENT_AMBULATORY_CARE_PROVIDER_SITE_OTHER): Payer: Medicare Other | Admitting: Physical Therapy

## 2023-03-02 DIAGNOSIS — M25561 Pain in right knee: Secondary | ICD-10-CM

## 2023-03-02 DIAGNOSIS — Z89612 Acquired absence of left leg above knee: Secondary | ICD-10-CM

## 2023-03-02 DIAGNOSIS — R2681 Unsteadiness on feet: Secondary | ICD-10-CM

## 2023-03-02 DIAGNOSIS — R262 Difficulty in walking, not elsewhere classified: Secondary | ICD-10-CM

## 2023-03-02 DIAGNOSIS — G8929 Other chronic pain: Secondary | ICD-10-CM

## 2023-03-02 DIAGNOSIS — M6281 Muscle weakness (generalized): Secondary | ICD-10-CM | POA: Diagnosis not present

## 2023-03-02 DIAGNOSIS — R2689 Other abnormalities of gait and mobility: Secondary | ICD-10-CM

## 2023-03-02 NOTE — Therapy (Addendum)
OUTPATIENT PHYSICAL THERAPY PROSTHETIC TREATMENT   Patient Name: Tammy [name] MRN: [id number] DOB:1956-01-27, 68 y.o., female Today's Date: 03/02/2023  END OF SESSION:  PT End of Session - 03/02/23 1017     Visit Number 7    Number of Visits 27    Date for PT Re-Evaluation 04/22/23    Authorization Type [employer]    Authorization Time Period $20 COPAY 16 PT VISITS APPROVED 12/16-2/11/2023    Authorization - Visit Number 7    Authorization - Number of Visits 16    Progress Note Due on Visit 10    PT Start Time 1018    PT Stop Time 1111    PT Time Calculation (min) 53 min    Equipment Utilized During Treatment Gait belt    Activity Tolerance Patient tolerated treatment well    Behavior During Therapy WFL for tasks assessed/performed             Past Medical History:  Diagnosis Date   Anemia    hx of   Anxiety    on meds   Arthritis    back/bilateral ankles   Bipolar disorder (HCC)    Breast cancer (HCC) 2022   LEFT breast-radiation   Depression    on meds   Family history of breast cancer 07/10/2020   Family history of colon cancer 07/10/2020   Family history of ovarian cancer 07/10/2020   GERD (gastroesophageal reflux disease)    on meds   Heart murmur    dx by PCP   Hepatitis C    HTN (hypertension)    on meds   Hx of radiation therapy    Liver cirrhosis (HCC)    from hep c   Neuromuscular disorder (HCC)    Osteopenia    Personal history of radiation therapy    Stroke (HCC) 09/2012   slurred speech and LEFT sided weakness/notes 10/04/2012   Past Surgical History:  Procedure Laterality Date   AMPUTATION Left 11/04/2022   Procedure: LEFT ABOVE KNEE AMPUTATION;  Surgeon: Nadara Mustard, MD;  Location: [facility] OR;  Service: Orthopedics;  Laterality: Left;   ANKLE FRACTURE SURGERY Left 2018   APPLICATION OF WOUND VAC Left 06/15/2022   Procedure: APPLICATION OF WOUND VAC;  Surgeon: Peggye Form, DO;  Location: [facility];  Service: Plastics;   Laterality: Left;   APPLICATION OF WOUND VAC Left 09/14/2022   Procedure: APPLICATION OF WOUND VAC;  Surgeon: Peggye Form, DO;  Location: [facility] OR;  Service: Plastics;  Laterality: Left;   BREAST LUMPECTOMY Left 07/2020   BREAST LUMPECTOMY WITH RADIOACTIVE SEED AND SENTINEL LYMPH NODE BIOPSY Left 07/26/2020   Procedure: LEFT BREAST LUMPECTOMY WITH RADIOACTIVE SEED AND SENTINEL LYMPH NODE BIOPSY;  Surgeon: Abigail Miyamoto, MD;  Location: [facility];  Service: General;  Laterality: Left;   CESAREAN SECTION  1986   COLONOSCOPY     DIAGNOSTIC LAPAROSCOPY  1985   ESOPHAGOGASTRODUODENOSCOPY N/A 02/21/2023   Procedure: ESOPHAGOGASTRODUODENOSCOPY (EGD);  Surgeon: Hilarie Fredrickson, MD;  Location: [facility];  Service: Gastroenterology;  Laterality: N/A;   HARDWARE REMOVAL Left 04/30/2022   Procedure: HARDWARE REMOVAL LEFT FEMUR;  Surgeon: Samson Frederic, MD;  Location: [facility];  Service: Orthopedics;  Laterality: Left;  150   HARDWARE REMOVAL Left 11/04/2022   Procedure: HARDWARE REMOVAL INFECTED LEFT TOTAL KNEE ARTHROPLASTY;  Surgeon: Nadara Mustard, MD;  Location: [facility] OR;  Service: Orthopedics;  Laterality: Left;   I & D KNEE WITH POLY EXCHANGE  Left 06/07/2022   Procedure: IRRIGATION AND DEBRIDEMENT KNEE WITH POLY EXCHANGE;  Surgeon: Samson Frederic, MD;  Location: [facility];  Service: Orthopedics;  Laterality: Left;   INCISION AND DRAINAGE OF WOUND Left 06/04/2022   Procedure: IRRIGATION AND DEBRIDEMENT KNEE;  Surgeon: Samson Frederic, MD;  Location: [facility];  Service: Orthopedics;  Laterality: Left;  60   INCISION AND DRAINAGE OF WOUND Left 06/15/2022   Procedure: excision of knee wound with Myriad;  Surgeon: Peggye Form, DO;  Location: [facility];  Service: Plastics;  Laterality: Left;   ORIF FEMUR FRACTURE Left 04/10/2021   Procedure: OPEN REDUCTION INTERNAL FIXATION (ORIF) DISTAL FEMUR FRACTURE;  Surgeon: Myrene Galas, MD;  Location: [facility] OR;  Service: Orthopedics;   Laterality: Left;   PARTIAL KNEE ARTHROPLASTY Left 04/30/2022   Procedure: DISTAL FEMUR REPLACEMENT;  Surgeon: Samson Frederic, MD;  Location: [facility];  Service: Orthopedics;  Laterality: Left;  150   Patient Active Problem List   Diagnosis Date Noted   Left femoral vein DVT (HCC) 02/20/2023   GERD (gastroesophageal reflux disease) 02/20/2023   Acute upper gastrointestinal bleeding 02/19/2023   Bipolar 1 disorder, depressed, partial remission (HCC) 11/18/2022   Left above-knee amputee (HCC) 11/13/2022   Infection of total knee replacement (HCC) 11/04/2022   S/P AKA (above knee amputation) unilateral, left (HCC) 11/04/2022   Chronic hepatitis C with cirrhosis (HCC) 06/06/2022   Surgical wound dehiscence, initial encounter 06/04/2022   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, left, with nonunion, subsequent encounter 04/30/2022   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 04/30/2022   Hypokalemia 04/16/2021   Hip fracture requiring operative repair (HCC) 04/16/2021   Vitamin D deficiency 04/12/2021   Hyponatremia 04/11/2021   Acute postoperative anemia due to expected blood loss 04/11/2021   Class 1 obesity 04/11/2021   Left comminuted and displaced fracture of the distal femur metaphysis 04/08/2021   History of CVA (cerebrovascular accident) 04/08/2021   Genetic testing 07/17/2020   Family history of breast cancer 07/10/2020   Family history of ovarian cancer 07/10/2020   Family history of colon cancer 07/10/2020   Malignant neoplasm of upper-outer quadrant of left breast in female, estrogen receptor positive (HCC) 07/09/2020   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   Chronic pain syndrome 10/06/2012   Nicotine dependence 10/06/2012   Anxiety state 10/05/2012   Bipolar disorder, unspecified (HCC) 10/05/2012   Slurred speech 10/04/2012   Left-sided weakness 10/04/2012   Hyperkalemia 10/04/2012   Acute CVA  (cerebrovascular accident) (HCC) 10/04/2012   Essential hypertension     PCP: Quitman Livings, MD  REFERRING PROVIDER: Aldean Baker, MD  ONSET DATE: 01/25/2023 prosthesis delivery  REFERRING DIAG: R60.454 (ICD-10-CM) - Hx of AKA (above knee amputation), left   THERAPY DIAG:  Other abnormalities of gait and mobility  Unsteadiness on feet  Muscle weakness (generalized)  Chronic pain of right knee  Difficulty in walking, not elsewhere classified  S/P AKA (above knee amputation) unilateral, left (HCC)  Rationale for Evaluation and Treatment: Rehabilitation  SUBJECTIVE:   SUBJECTIVE STATEMENT: Feels good today but is having some issues with the fit of the socket as it is rubbing against her and cutting into the hip. States that her R knee is feeling better due to being able to put more weight through the L prosthesis. Was able to get in and out of the car with the prosthesis donned without issue. States that she has been wearing the prosthesis for 8hrs a day without issue.  Pt accompanied by: [name], significant other  PERTINENT HISTORY: AKA, anemia, anxiety, arthritis, Bipolar disorder, Breast CA left with radiation, HTN, Hep C, liver cirrhosis, Neuromuscular disorder, osteopenia, stroke Left hemiparesis 2014  PAIN:  Are you having pain? No pain  Pain location: right knee in joint Pain description: throbbing Aggravating factors: standing or walking too much, or twisting motion Relieving factors: meds or medicated lotions  PRECAUTIONS: Fall  WEIGHT BEARING RESTRICTIONS: No  FALLS: Has patient fallen in last 6 months? Yes. Number of falls 1 no injuries  LIVING ENVIRONMENT: Lives with: lives with their spouse Lives in: Mobile home Home Access: Ramped entrance or 4 steps with double rail to [employer] layout: One level Has following equipment at home: Single point cane, Environmental consultant - 2 wheeled, Wheelchair (manual), Shower bench, bed side commode, Grab bars, and Ramped  entry  OCCUPATION: retired from nursing  PLOF: Independent prior to Fracture 04/10/2021.  Since fracture was able to get to cane for community until hardware broke.    PATIENT GOALS:  walk independently in community, travel, drive, take care of 4yo great granddaughter  OBJECTIVE:  COGNITION: Overall cognitive status: Within functional limits for tasks assessed  POSTURE: rounded shoulders, forward head, flexed trunk , and weight shift right  LOWER EXTREMITY ROM:  ROM P:passive  A:active Right eval Left eval  Hip flexion    Hip extension    Hip abduction    Hip adduction    Hip internal rotation    Hip external rotation    Knee flexion    Knee extension    Ankle dorsiflexion    Ankle plantarflexion    Ankle inversion    Ankle eversion     (Blank rows = not tested)  LOWER EXTREMITY MMT:  MMT Right eval Left eval  Hip flexion    Hip extension    Hip abduction    Hip adduction    Hip internal rotation    Hip external rotation    Knee flexion    Knee extension    Ankle dorsiflexion    Ankle plantarflexion    Ankle inversion    Ankle eversion    At Evaluation all strength testing is grossly seated and functionally standing / gait. (Blank rows = not tested)  TRANSFERS: Evaluation on 01/25/2023:  Sit to stand: SBA w/c to RW requires armrests on w/c and stabilization with RW.   Stand to sit: SBA RW to w/c requires armrests on w/c and stabilization with RW.    FUNCTIONAL TESTs:  03/02/2023: Sharlene Motts Balance 22/56  [facility] PT Assessment - 03/02/23 1026       Standardized Balance Assessment   Standardized Balance Assessment Berg Balance Test      Berg Balance Test   Sit to Stand Able to stand  independently using hands    Standing Unsupported Able to stand 2 minutes with supervision    Sitting with Back Unsupported but Feet Supported on Floor or Stool Able to sit safely and securely 2 minutes    Stand to Sit Controls descent by using hands    Transfers Able to  transfer safely, minor use of hands    Standing Unsupported with Eyes Closed Able to stand 10 seconds with supervision    Standing Unsupported with Feet Together Needs help to attain position but able to stand for 30 seconds with feet together    From Standing, Reach Forward with Outstretched Arm Reaches forward but needs supervision    From Standing Position, Pick up Object  from Floor Unable to try/needs assist to keep balance    From Standing Position, Turn to Look Behind Over each Shoulder Needs assist to keep from losing balance and falling    Turn 360 Degrees Needs assistance while turning    Standing Unsupported, Alternately Place Feet on Step/Stool Needs assistance to keep from falling or unable to try    Standing Unsupported, One Foot in Front Loses balance while stepping or standing    Standing on One Leg Unable to try or needs assist to prevent fall    Total Score 22    Berg comment: Group Information    BERG  < 36 high risk for falls (close to 100%) 46-51 moderate (>50%)   37-45 significant (>80%) 52-55 lower (> 25%)              Eval: Berg Balance Scale: 15/56   GAIT: 01/26/2023: Patient ambulated 10 feet with rolling walker with minA but required constant cueing for technique and safety.  CURRENT PROSTHETIC WEAR ASSESSMENT: Evaluation on 01/25/2023:  Patient is dependent with: skin check, residual limb care, care of non-amputated limb, prosthetic cleaning, ply sock cleaning, correct ply sock adjustment, proper wear schedule/adjustment, and proper weight-bearing schedule/adjustment Donning prosthesis: Min A Doffing prosthesis: SBA Prosthetic wear tolerance: tolerated wear 1 hour prior to PT evaluation.  Prosthetic weight bearing tolerance: 5 minutes with BUE support on RW with limited wear on prosthesis.  Edema: pitting Prosthetic description: Ischial Containment socket with flexible inner socket, silicon liner with velcro strap suspension.   TODAY'S TREATMENT:                                                                                                                              DATE:  03/02/2023: Prosthetic Training with Transfemoral Prosthesis: -Pt able to don/doff pants with minimal assist demonstrating weight shift and alternating UE support on walker. Required adjustment of strap to ensure snug fit of prosthesis.  -PT demo & verbal cues on how to pick up an object from floor keeping prosthetic knee extended for balance assistance and setup for safety to practice at home.  Patient return demo 2 reps with supervision using BUE support to abducted prosthesis & stabilize upon arising.  Patient verbalized understanding and returned demonstration with supervision for safety.  Her fiance verbalized how to provide supervision.  -Ambulation with RW 36ft with 180deg turn to R. VC for increased step through pattern especially on R. After bout:  99% O2 and 80 HR -Ambulation to elevator ~190ft with 90* turn R, pt able to push button for door without UE assist on walker and turn to L before walking out of door with plenty of time before door shut. Continued VC for step through with R.   Physical Performance Testing: -See objective for Berg Balance  22/56  TREATMENT:  DATE:  02/25/2023: Prosthetic Training with Transfemoral Prosthesis: RHR 82 SpO2 97%  Pt donned prosthesis correctly except reminder for foot position to facilitate knee stability with pulling on suspension strap.  Pt verbalized proper cleaning.  Pt amb 40' with RW with SBA. Sit to/from stand chair without armrests using UE to RW with CGA.  Pt neg 6.5" curb with RW after review demo / verbal cues with Min/CGA.  PT demo & verbal cues on neg ramp with RW.  Pt neg 12* ramp with RW with modA. Pt amb 40' with RW with SBA.  PT demo & verbal cues on car transfer.  She neg 4" curb outside  with RW with SBA.  Pt able to transfer in & out of her Zenaida Niece passenger seat using RW with cues.        TREATMENT:                                                                                                                             DATE:  02/23/2023: Prosthetic Training with Transfemoral Prosthesis: RHR 77 SpO2 99%  after standing to don prosthesis HR 99 SpO2 97% with ShOB PT reviewed how to tighten strap.  Pt verbalized understanding.  Pt amb 20' with RW with CGA / verbal cues on heel contact, weight shift over prosthesis and upright posture. HR 73 SpO2 97% PT reviewed step length, step through pattern and weight shift. Pt amb 41' with RW with CGA. HR 81 SpO2 98% PT demo & verbal cues on negotiating curb. Pt neg 6.5" with RW with CGA.  SpO2 98% HR 78 Pt amb 40' with RW with CGA.     TREATMENT:                                                                                                                             DATE:  02/01/2023: Prosthetic Training with Transfemoral Prosthesis: Pt arrived with liner & socks properly donned.  She donned the prosthesis including tightening the suspension strap with minA / supervision.  Pt recommended increasing wear to 3hrs 3x/day with liner & prosthesis off for 2 hours between wears.  Wear shrinker at all times prosthesis is off.  PT educated on rationale for building up to wear all awake hours. PT also reviewed sitting with folded towel or pillow under right side to level pelvis and placing cloth or hand under stomach so socket does not rub.  Pt verbalized understanding.  PT demo & verbal sit to/from stand from 18" chair without armrests using RUE to push on chair seat & RW for stabilization. Pt performed 2 reps with minA 1st rep & CGA 2nd rep.   Pt amb 20' & 25' (max tolerable until she fatigued) with RW with supervision.  PT demo & verbal cues how to follow with w/c to enable supervision / assist as needed & ability to sit when tired. Pt & fiance  verbalized understanding.  PT demo & verbal cues on negotiating curb with RW & TFA prosthesis.  Pt neg 6.5" curb with RW with minA & constant verbal cues.      HOME EXERCISE PROGRAM:  ASSESSMENT:  CLINICAL IMPRESSION: Patient demonstrated increased abilities by increasing Berg Balance score from 15 to 22/56. Patient has good insight into deficits and continues to participate fully in sessions. Patient encouraged to reduce use RW for sit/stand transfer to promote more independence and increase body mechanics for proper placement and sequencing of L prosthesis. Patient continues to demonstrate deficits in balance, gait, and strength and will benefit from continued skilled physical therapy.      OBJECTIVE IMPAIRMENTS: Abnormal gait, decreased activity tolerance, decreased balance, decreased endurance, decreased knowledge of condition, decreased knowledge of use of DME, decreased mobility, difficulty walking, decreased ROM, decreased strength, increased edema, impaired flexibility, postural dysfunction, prosthetic dependency , obesity, and pain.   ACTIVITY LIMITATIONS: carrying, lifting, bending, sitting, standing, stairs, transfers, and locomotion level  PARTICIPATION LIMITATIONS: meal prep, cleaning, and community activity  PERSONAL FACTORS: Fitness, Time since onset of injury/illness/exacerbation, and 3+ comorbidities: see PMH  are also affecting patient's functional outcome.   REHAB POTENTIAL: Good  CLINICAL DECISION MAKING: Evolving/moderate complexity  EVALUATION COMPLEXITY: Moderate   GOALS: Goals reviewed with patient? Yes  SHORT TERM GOALS: Target date: 02/25/2023  Patient donnes prosthesis modified independent & verbalizes proper cleaning. Baseline: SEE OBJECTIVE DATA Goal status: MET 02/25/2023 2.  Patient tolerates prosthesis >10 hrs total /day without skin issues or limb pain after standing. Baseline: SEE OBJECTIVE DATA Goal status: MET 02/25/2023  3.  Berg Balance  >20/56 Baseline: SEE OBJECTIVE DATA Goal status: MET 03/02/2023  4. Patient ambulates 21' with RW & prosthesis with supervision. Baseline: SEE OBJECTIVE DATA Goal status: Ongoing 02/25/2023  5. Patient negotiates ramps & curbs with RW & prosthesis with minA. Baseline: SEE OBJECTIVE DATA Goal status: Ongoing  02/25/2023  LONG TERM GOALS: Target date: 04/22/2023  Patient demonstrates & verbalized understanding of prosthetic care to enable safe utilization of prosthesis. Baseline: SEE OBJECTIVE DATA Goal status: Ongoing    02/09/2023  Patient tolerates prosthesis wear >90% of awake hours without skin or residual limb pain issues. Baseline: SEE OBJECTIVE DATA Goal status: Ongoing    02/09/2023  Berg Balance >35/56 to indicate lower fall risk Baseline: SEE OBJECTIVE DATA Goal status: Ongoing  01/28/2023  Patient ambulates >300' with prosthesis and cane or less modified independent. Baseline: SEE OBJECTIVE DATA Goal status: Ongoing   02/09/2023  Patient negotiates ramps, curbs & stairs with single rail with prosthesis  and cane or less modified independent. Baseline: SEE OBJECTIVE DATA Goal status: Ongoing   02/09/2023   Patient reports right knee pain </= 2/10 with standing and gait activities.   Baseline: SEE OBJECTIVE DATA Goal status:   Ongoing  02/09/2023  PLAN:  PT FREQUENCY:  3 x for 1 week, then 2x/week  PT DURATION: 90 days / 13 weeks  PLANNED INTERVENTIONS: 97164- PT Re-evaluation, 97110-Therapeutic exercises, 97530- Therapeutic  activity, [id number]- Neuromuscular re-education, [id number]- Self Care, [id number]- Gait training, [id number]- Prosthetic training, Patient/Family education, Balance training, Stair training, DME instructions, and physical performance testing  PLAN FOR NEXT SESSION:  Work on dynamic balance, weight shifting with marching for single leg stance/balance, continue ambulation with step through pattern. Work on ramps & curbs with RW.   Arnett Duddy, Ericka Marcellus,  Student-PT 03/02/2023, 11:54 AM  During this evaluation / treatment session, the Physical Therapist/Physical Therapist Assistant was present and participating in the session. This entire session was performed under direct supervision and direction of a licensed therapist/therapist assistant . I have personally read, edited and approve of the note as written.   Vladimir Faster, PT, DPT 03/02/2023, 12:37 PM

## 2023-03-04 ENCOUNTER — Encounter: Payer: Self-pay | Admitting: Physical Therapy

## 2023-03-04 ENCOUNTER — Ambulatory Visit: Payer: Medicare Other | Admitting: Physical Therapy

## 2023-03-04 DIAGNOSIS — R262 Difficulty in walking, not elsewhere classified: Secondary | ICD-10-CM

## 2023-03-04 DIAGNOSIS — R2689 Other abnormalities of gait and mobility: Secondary | ICD-10-CM | POA: Diagnosis not present

## 2023-03-04 DIAGNOSIS — R2681 Unsteadiness on feet: Secondary | ICD-10-CM

## 2023-03-04 DIAGNOSIS — Z89612 Acquired absence of left leg above knee: Secondary | ICD-10-CM

## 2023-03-04 DIAGNOSIS — M6281 Muscle weakness (generalized): Secondary | ICD-10-CM

## 2023-03-04 NOTE — Therapy (Addendum)
OUTPATIENT PHYSICAL THERAPY PROSTHETIC TREATMENT   Patient Name: Tammy [name] MRN: [id number] DOB:05-Jun-1955, 68 y.o., female Today's Date: 03/04/2023  END OF SESSION:  PT End of Session - 03/04/23 1012     Visit Number 8    Number of Visits 27    Date for PT Re-Evaluation 04/22/23    Authorization Type [employer]    Authorization Time Period $20 COPAY 16 PT VISITS APPROVED 12/16-2/11/2023    Authorization - Visit Number 8    Authorization - Number of Visits 16    Progress Note Due on Visit 10    PT Start Time 1011    PT Stop Time 1059    PT Time Calculation (min) 48 min    Equipment Utilized During Treatment Gait belt    Activity Tolerance Patient tolerated treatment well    Behavior During Therapy WFL for tasks assessed/performed             Past Medical History:  Diagnosis Date   Anemia    hx of   Anxiety    on meds   Arthritis    back/bilateral ankles   Bipolar disorder (HCC)    Breast cancer (HCC) 2022   LEFT breast-radiation   Depression    on meds   Family history of breast cancer 07/10/2020   Family history of colon cancer 07/10/2020   Family history of ovarian cancer 07/10/2020   GERD (gastroesophageal reflux disease)    on meds   Heart murmur    dx by PCP   Hepatitis C    HTN (hypertension)    on meds   Hx of radiation therapy    Liver cirrhosis (HCC)    from hep c   Neuromuscular disorder (HCC)    Osteopenia    Personal history of radiation therapy    Stroke (HCC) 09/2012   slurred speech and LEFT sided weakness/notes 10/04/2012   Past Surgical History:  Procedure Laterality Date   AMPUTATION Left 11/04/2022   Procedure: LEFT ABOVE KNEE AMPUTATION;  Surgeon: Nadara Mustard, MD;  Location: [facility] OR;  Service: Orthopedics;  Laterality: Left;   ANKLE FRACTURE SURGERY Left 2018   APPLICATION OF WOUND VAC Left 06/15/2022   Procedure: APPLICATION OF WOUND VAC;  Surgeon: Peggye Form, DO;  Location: [facility];  Service: Plastics;   Laterality: Left;   APPLICATION OF WOUND VAC Left 09/14/2022   Procedure: APPLICATION OF WOUND VAC;  Surgeon: Peggye Form, DO;  Location: [facility] OR;  Service: Plastics;  Laterality: Left;   BREAST LUMPECTOMY Left 07/2020   BREAST LUMPECTOMY WITH RADIOACTIVE SEED AND SENTINEL LYMPH NODE BIOPSY Left 07/26/2020   Procedure: LEFT BREAST LUMPECTOMY WITH RADIOACTIVE SEED AND SENTINEL LYMPH NODE BIOPSY;  Surgeon: Abigail Miyamoto, MD;  Location: [facility];  Service: General;  Laterality: Left;   CESAREAN SECTION  1986   COLONOSCOPY     DIAGNOSTIC LAPAROSCOPY  1985   ESOPHAGOGASTRODUODENOSCOPY N/A 02/21/2023   Procedure: ESOPHAGOGASTRODUODENOSCOPY (EGD);  Surgeon: Hilarie Fredrickson, MD;  Location: [facility];  Service: Gastroenterology;  Laterality: N/A;   HARDWARE REMOVAL Left 04/30/2022   Procedure: HARDWARE REMOVAL LEFT FEMUR;  Surgeon: Samson Frederic, MD;  Location: [facility];  Service: Orthopedics;  Laterality: Left;  150   HARDWARE REMOVAL Left 11/04/2022   Procedure: HARDWARE REMOVAL INFECTED LEFT TOTAL KNEE ARTHROPLASTY;  Surgeon: Nadara Mustard, MD;  Location: [facility] OR;  Service: Orthopedics;  Laterality: Left;   I & D KNEE WITH POLY EXCHANGE  Left 06/07/2022   Procedure: IRRIGATION AND DEBRIDEMENT KNEE WITH POLY EXCHANGE;  Surgeon: Samson Frederic, MD;  Location: [facility];  Service: Orthopedics;  Laterality: Left;   INCISION AND DRAINAGE OF WOUND Left 06/04/2022   Procedure: IRRIGATION AND DEBRIDEMENT KNEE;  Surgeon: Samson Frederic, MD;  Location: [facility];  Service: Orthopedics;  Laterality: Left;  60   INCISION AND DRAINAGE OF WOUND Left 06/15/2022   Procedure: excision of knee wound with Myriad;  Surgeon: Peggye Form, DO;  Location: [facility];  Service: Plastics;  Laterality: Left;   ORIF FEMUR FRACTURE Left 04/10/2021   Procedure: OPEN REDUCTION INTERNAL FIXATION (ORIF) DISTAL FEMUR FRACTURE;  Surgeon: Myrene Galas, MD;  Location: [facility] OR;  Service: Orthopedics;   Laterality: Left;   PARTIAL KNEE ARTHROPLASTY Left 04/30/2022   Procedure: DISTAL FEMUR REPLACEMENT;  Surgeon: Samson Frederic, MD;  Location: [facility];  Service: Orthopedics;  Laterality: Left;  150   Patient Active Problem List   Diagnosis Date Noted   Left femoral vein DVT (HCC) 02/20/2023   GERD (gastroesophageal reflux disease) 02/20/2023   Acute upper gastrointestinal bleeding 02/19/2023   Bipolar 1 disorder, depressed, partial remission (HCC) 11/18/2022   Left above-knee amputee (HCC) 11/13/2022   Infection of total knee replacement (HCC) 11/04/2022   S/P AKA (above knee amputation) unilateral, left (HCC) 11/04/2022   Chronic hepatitis C with cirrhosis (HCC) 06/06/2022   Surgical wound dehiscence, initial encounter 06/04/2022   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, left, with nonunion, subsequent encounter 04/30/2022   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 04/30/2022   Hypokalemia 04/16/2021   Hip fracture requiring operative repair (HCC) 04/16/2021   Vitamin D deficiency 04/12/2021   Hyponatremia 04/11/2021   Acute postoperative anemia due to expected blood loss 04/11/2021   Class 1 obesity 04/11/2021   Left comminuted and displaced fracture of the distal femur metaphysis 04/08/2021   History of CVA (cerebrovascular accident) 04/08/2021   Genetic testing 07/17/2020   Family history of breast cancer 07/10/2020   Family history of ovarian cancer 07/10/2020   Family history of colon cancer 07/10/2020   Malignant neoplasm of upper-outer quadrant of left breast in female, estrogen receptor positive (HCC) 07/09/2020   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   Chronic pain syndrome 10/06/2012   Nicotine dependence 10/06/2012   Anxiety state 10/05/2012   Bipolar disorder, unspecified (HCC) 10/05/2012   Slurred speech 10/04/2012   Left-sided weakness 10/04/2012   Hyperkalemia 10/04/2012   Acute CVA  (cerebrovascular accident) (HCC) 10/04/2012   Essential hypertension     PCP: Quitman Livings, MD  REFERRING PROVIDER: Aldean Baker, MD  ONSET DATE: 01/25/2023 prosthesis delivery  REFERRING DIAG: Z61.096 (ICD-10-CM) - Hx of AKA (above knee amputation), left   THERAPY DIAG:  Other abnormalities of gait and mobility  Unsteadiness on feet  Muscle weakness (generalized)  Difficulty in walking, not elsewhere classified  S/P AKA (above knee amputation) unilateral, left (HCC)  Rationale for Evaluation and Treatment: Rehabilitation  SUBJECTIVE:   SUBJECTIVE STATEMENT: Still working on balance at home, felt good after last session.  Has some pain from the socket of the prosthesis but no sores. Has appt at [facility] tomorrow for adjustments.   Pt accompanied by: [name], significant other  PERTINENT HISTORY: AKA, anemia, anxiety, arthritis, Bipolar disorder, Breast CA left with radiation, HTN, Hep C, liver cirrhosis, Neuromuscular disorder, osteopenia, stroke Left hemiparesis 2014  PAIN:  Are you having pain? Yes, no rate given. "It does not stop me  from working with therapy"   Pain location: hip and back from the socket fitting improperly Pain description: throbbing Aggravating factors: standing or walking too much, or twisting motion Relieving factors: meds or medicated lotions  PRECAUTIONS: Fall  WEIGHT BEARING RESTRICTIONS: No  FALLS: Has patient fallen in last 6 months? Yes. Number of falls 1 no injuries  LIVING ENVIRONMENT: Lives with: lives with their spouse Lives in: Mobile home Home Access: Ramped entrance or 4 steps with double rail to [employer] layout: One level Has following equipment at home: Single point cane, Environmental consultant - 2 wheeled, Wheelchair (manual), Shower bench, bed side commode, Grab bars, and Ramped entry  OCCUPATION: retired from nursing  PLOF: Independent prior to Fracture 04/10/2021.  Since fracture was able to get to cane for community until hardware broke.     PATIENT GOALS:  walk independently in community, travel, drive, take care of 4yo great granddaughter  OBJECTIVE:  COGNITION: Overall cognitive status: Within functional limits for tasks assessed  POSTURE: rounded shoulders, forward head, flexed trunk , and weight shift right  LOWER EXTREMITY ROM:  ROM P:passive  A:active Right eval Left eval  Hip flexion    Hip extension    Hip abduction    Hip adduction    Hip internal rotation    Hip external rotation    Knee flexion    Knee extension    Ankle dorsiflexion    Ankle plantarflexion    Ankle inversion    Ankle eversion     (Blank rows = not tested)  LOWER EXTREMITY MMT:  MMT Right eval Left eval  Hip flexion    Hip extension    Hip abduction    Hip adduction    Hip internal rotation    Hip external rotation    Knee flexion    Knee extension    Ankle dorsiflexion    Ankle plantarflexion    Ankle inversion    Ankle eversion    At Evaluation all strength testing is grossly seated and functionally standing / gait. (Blank rows = not tested)  TRANSFERS: Evaluation on 01/25/2023:  Sit to stand: SBA w/c to RW requires armrests on w/c and stabilization with RW.   Stand to sit: SBA RW to w/c requires armrests on w/c and stabilization with RW.    FUNCTIONAL TESTs:  03/02/2023: Sharlene Motts Balance 22/56     Eval: Berg Balance Scale: 15/56   GAIT: 01/26/2023: Patient ambulated 10 feet with rolling walker with minA but required constant cueing for technique and safety.  CURRENT PROSTHETIC WEAR ASSESSMENT: Evaluation on 01/25/2023:  Patient is dependent with: skin check, residual limb care, care of non-amputated limb, prosthetic cleaning, ply sock cleaning, correct ply sock adjustment, proper wear schedule/adjustment, and proper weight-bearing schedule/adjustment Donning prosthesis: Min A Doffing prosthesis: SBA Prosthetic wear tolerance: tolerated wear 1 hour prior to PT evaluation.  Prosthetic weight bearing  tolerance: 5 minutes with BUE support on RW with limited wear on prosthesis.  Edema: pitting Prosthetic description: Ischial Containment socket with flexible inner socket, silicon liner with velcro strap suspension.   TODAY'S TREATMENT:  DATE:  03/04/2023: Prosthetic Training with Transfemoral Prosthesis: See HEP section. Demonstration, VC, TC, and visual cues used with patient able to replicate activity and verbalized understanding. Patient required VC for proper weight shift to prosthesis via hips instead of trunk, use of sink to hold on to for safety with activity, and maintaining appropriate feet distance with proper stacked posture of hip over prosthesis. Verbal cues to begin to feel proprioception of LLE with socket - limb interface with HEP exercises.   TREATMENT:                                                                                                                             DATE:  03/02/2023: Prosthetic Training with Transfemoral Prosthesis: -Pt able to don/doff pants with minimal assist demonstrating weight shift and alternating UE support on walker. Required adjustment of strap to ensure snug fit of prosthesis.  -PT demo & verbal cues on how to pick up an object from floor keeping prosthetic knee extended for balance assistance and setup for safety to practice at home.  Patient return demo 2 reps with supervision using BUE support to abducted prosthesis & stabilize upon arising.  Patient verbalized understanding and returned demonstration with supervision for safety.  Her fiance verbalized how to provide supervision.  -Ambulation with RW 12ft with 180deg turn to R. VC for increased step through pattern especially on R. After bout:  99% O2 and 80 HR -Ambulation to elevator ~192ft with 90* turn R, pt able to push button for door without UE assist on walker and  turn to L before walking out of door with plenty of time before door shut. Continued VC for step through with R.   Physical Performance Testing: -See objective for Berg Balance  22/56  TREATMENT:                                                                                                                             DATE:  02/25/2023: Prosthetic Training with Transfemoral Prosthesis: RHR 82 SpO2 97%  Pt donned prosthesis correctly except reminder for foot position to facilitate knee stability with pulling on suspension strap.  Pt verbalized proper cleaning.  Pt amb 40' with RW with SBA. Sit to/from stand chair without armrests using UE to RW with CGA.  Pt neg 6.5" curb with RW after review demo / verbal cues with Min/CGA.  PT demo & verbal cues on neg ramp  with RW.  Pt neg 12* ramp with RW with modA. Pt amb 40' with RW with SBA.  PT demo & verbal cues on car transfer.  She neg 4" curb outside with RW with SBA.  Pt able to transfer in & out of her Zenaida Niece passenger seat using RW with cues.     TREATMENT:                                                                                                                             DATE:  02/23/2023: Prosthetic Training with Transfemoral Prosthesis: RHR 77 SpO2 99%  after standing to don prosthesis HR 99 SpO2 97% with ShOB PT reviewed how to tighten strap.  Pt verbalized understanding.  Pt amb 20' with RW with CGA / verbal cues on heel contact, weight shift over prosthesis and upright posture. HR 73 SpO2 97% PT reviewed step length, step through pattern and weight shift. Pt amb 13' with RW with CGA. HR 81 SpO2 98% PT demo & verbal cues on negotiating curb. Pt neg 6.5" with RW with CGA.  SpO2 98% HR 78 Pt amb 40' with RW with CGA.     HOME EXERCISE PROGRAM: Do each exercise 1-2  times per day Do each exercise 5-10 repetitions Hold each exercise for 2 seconds to feel your location  AT SINK FIND YOUR MIDLINE POSITION AND PLACE FEET EQUAL DISTANCE  FROM THE MIDLINE.  Try to find this position when standing still for activities.   USE TAPE ON FLOOR TO MARK THE MIDLINE POSITION which is even with middle of sink.  You also should try to feel with your limb pressure in socket.  You are trying to feel with limb what you used to feel with the bottom of your foot.  Side to Side Shift: Moving your hips only (not shoulders): move weight onto your left leg, HOLD/FEEL pressure in socket.  Move back to equal weight on each leg, HOLD/FEEL pressure in socket. Move weight onto your right leg, HOLD/FEEL pressure in socket. Move back to equal weight on each leg, HOLD/FEEL pressure in socket. Repeat.  Start with both hands on sink, progress to hand on prosthetic side only, then no hands.  Front to Back Shift: Moving your hips only (not shoulders): move your weight forward onto your toes, HOLD/FEEL pressure in socket. Move your weight back to equal Flat Foot on both legs, HOLD/FEEL  pressure in socket. Move your weight back onto your heels, HOLD/FEEL  pressure in socket. Move your weight back to equal on both legs, HOLD/FEEL  pressure in socket. Repeat.  Start with both hands on sink, progress to hand on prosthetic side only, then no hands.  Moving Cones / Cups: With equal weight on each leg: Hold on with one hand the first time, then progress to no hand supports. Move cups from one side of sink to the other. Place cups ~2" out of your reach, progress to 10" beyond reach.  Place one hand in middle of sink and reach with other hand. Do both arms.  Then hover one hand and move cups with other hand.  Overhead/Upward Reaching: alternated reaching up to top cabinets or ceiling if no cabinets present. Keep equal weight on each leg. Start with one hand support on counter while other hand reaches and progress to no hand support with reaching.  ace one hand in middle of sink and reach with other hand. Do both arms.  Then hover one hand and move cups with other hand.  5.    Looking Over Shoulders: With equal weight on each leg: alternate turning to look over your shoulders with one hand support on counter as needed.  Start with head motions only to look in front of shoulder, then even with shoulder and progress to looking behind you. To look to side, move head /eyes, then shoulder on side looking pulls back, shift more weight to side looking and pull hip back. Place one hand in middle of sink and let go with other hand so your shoulder can pull back. Switch hands to look other way.   Then hover one hand and look over shoulder. If looking right, use left hand at sink. If looking left, use right hand at sink. 6.  Stepping with leg that is not amputated:  Move items under cabinet out of your way. Shift your hips/pelvis so weight on prosthesis. Tighten muscles in hip on prosthetic side.  SLOWLY step other leg so front of foot is in cabinet. Then step back to floor.    ASSESSMENT:  CLINICAL IMPRESSION: Patient did well with introduction of HEP for balance. Patient required consistent VC for proper LE set up before attempting activity. Deficits with appropriate weight shift noted requiring TC for patient to improve shift to prosthesis side. Patient demonstrated safety with activities on HEP for safe practice at home. Patient continues to make progress towards goals and will benefit from continued skilled physical therapy to address residual deficits.    OBJECTIVE IMPAIRMENTS: Abnormal gait, decreased activity tolerance, decreased balance, decreased endurance, decreased knowledge of condition, decreased knowledge of use of DME, decreased mobility, difficulty walking, decreased ROM, decreased strength, increased edema, impaired flexibility, postural dysfunction, prosthetic dependency , obesity, and pain.   ACTIVITY LIMITATIONS: carrying, lifting, bending, sitting, standing, stairs, transfers, and locomotion level  PARTICIPATION LIMITATIONS: meal prep, cleaning, and community  activity  PERSONAL FACTORS: Fitness, Time since onset of injury/illness/exacerbation, and 3+ comorbidities: see PMH  are also affecting patient's functional outcome.   REHAB POTENTIAL: Good  CLINICAL DECISION MAKING: Evolving/moderate complexity  EVALUATION COMPLEXITY: Moderate   GOALS: Goals reviewed with patient? Yes  SHORT TERM GOALS: Target date: 02/25/2023  Patient donnes prosthesis modified independent & verbalizes proper cleaning. Baseline: SEE OBJECTIVE DATA Goal status: MET 02/25/2023 2.  Patient tolerates prosthesis >10 hrs total /day without skin issues or limb pain after standing. Baseline: SEE OBJECTIVE DATA Goal status: MET 02/25/2023  3.  Berg Balance >20/56 Baseline: SEE OBJECTIVE DATA Goal status: MET 03/02/2023  4. Patient ambulates 62' with RW & prosthesis with supervision. Baseline: SEE OBJECTIVE DATA Goal status: Ongoing 02/25/2023  5. Patient negotiates ramps & curbs with RW & prosthesis with minA. Baseline: SEE OBJECTIVE DATA Goal status: Ongoing  02/25/2023  LONG TERM GOALS: Target date: 04/22/2023  Patient demonstrates & verbalized understanding of prosthetic care to enable safe utilization of prosthesis. Baseline: SEE OBJECTIVE DATA Goal status: Ongoing    02/09/2023  Patient tolerates prosthesis wear >90% of  awake hours without skin or residual limb pain issues. Baseline: SEE OBJECTIVE DATA Goal status: Ongoing    02/09/2023  Berg Balance >35/56 to indicate lower fall risk Baseline: SEE OBJECTIVE DATA Goal status: Ongoing  01/28/2023  Patient ambulates >300' with prosthesis and cane or less modified independent. Baseline: SEE OBJECTIVE DATA Goal status: Ongoing   02/09/2023  Patient negotiates ramps, curbs & stairs with single rail with prosthesis  and cane or less modified independent. Baseline: SEE OBJECTIVE DATA Goal status: Ongoing   02/09/2023   Patient reports right knee pain </= 2/10 with standing and gait activities.   Baseline:  SEE OBJECTIVE DATA Goal status:   Ongoing  02/09/2023  PLAN:  PT FREQUENCY:  3 x for 1 week, then 2x/week  PT DURATION: 90 days / 13 weeks  PLANNED INTERVENTIONS: 97164- PT Re-evaluation, 97110-Therapeutic exercises, 97530- Therapeutic activity, [id number]- Neuromuscular re-education, [phone]- Self Care, 69629- Gait training, [phone]- Prosthetic training, Patient/Family education, Balance training, Stair training, DME instructions, and physical performance testing  PLAN FOR NEXT SESSION:  check remaining STGs.  Blazepods for dynamic balance with varied heights/distance. Continue gait training with RW.     Tamario Heal, Woodruff Skirvin, Student-PT 03/04/2023, 11:10 AM  During this evaluation / treatment session, the Physical Therapist/Physical Therapist Assistant was present and participating in the session. This entire session was performed under direct supervision and direction of a licensed therapist/therapist assistant . I have personally read, edited and approve of the note as written.   Vladimir Faster, PT, DPT 03/04/2023, 11:56 AM

## 2023-03-04 NOTE — Patient Instructions (Signed)
Do each exercise 1-2  times per day Do each exercise 5-10 repetitions Hold each exercise for 2 seconds to feel your location  AT SINK FIND YOUR MIDLINE POSITION AND PLACE FEET EQUAL DISTANCE FROM THE MIDLINE.  Try to find this position when standing still for activities.   USE TAPE ON FLOOR TO MARK THE MIDLINE POSITION which is even with middle of sink.  You also should try to feel with your limb pressure in socket.  You are trying to feel with limb what you used to feel with the bottom of your foot.  Side to Side Shift: Moving your hips only (not shoulders): move weight onto your left leg, HOLD/FEEL pressure in socket.  Move back to equal weight on each leg, HOLD/FEEL pressure in socket. Move weight onto your right leg, HOLD/FEEL pressure in socket. Move back to equal weight on each leg, HOLD/FEEL pressure in socket. Repeat.  Start with both hands on sink, progress to hand on prosthetic side only, then no hands.  Front to Back Shift: Moving your hips only (not shoulders): move your weight forward onto your toes, HOLD/FEEL pressure in socket. Move your weight back to equal Flat Foot on both legs, HOLD/FEEL  pressure in socket. Move your weight back onto your heels, HOLD/FEEL  pressure in socket. Move your weight back to equal on both legs, HOLD/FEEL  pressure in socket. Repeat.  Start with both hands on sink, progress to hand on prosthetic side only, then no hands.  Moving Cones / Cups: With equal weight on each leg: Hold on with one hand the first time, then progress to no hand supports. Move cups from one side of sink to the other. Place cups ~2" out of your reach, progress to 10" beyond reach.  Place one hand in middle of sink and reach with other hand. Do both arms.  Then hover one hand and move cups with other hand.  Overhead/Upward Reaching: alternated reaching up to top cabinets or ceiling if no cabinets present. Keep equal weight on each leg. Start with one hand support on counter while other  hand reaches and progress to no hand support with reaching.  ace one hand in middle of sink and reach with other hand. Do both arms.  Then hover one hand and move cups with other hand.  5.   Looking Over Shoulders: With equal weight on each leg: alternate turning to look over your shoulders with one hand support on counter as needed.  Start with head motions only to look in front of shoulder, then even with shoulder and progress to looking behind you. To look to side, move head /eyes, then shoulder on side looking pulls back, shift more weight to side looking and pull hip back. Place one hand in middle of sink and let go with other hand so your shoulder can pull back. Switch hands to look other way.   Then hover one hand and look over shoulder. If looking right, use left hand at sink. If looking left, use right hand at sink. 6.  Stepping with leg that is not amputated:  Move items under cabinet out of your way. Shift your hips/pelvis so weight on prosthesis. Tighten muscles in hip on prosthetic side.  SLOWLY step other leg so front of foot is in cabinet. Then step back to floor.   

## 2023-03-09 ENCOUNTER — Encounter: Payer: Medicare Other | Admitting: Physical Therapy

## 2023-03-09 NOTE — Therapy (Incomplete)
OUTPATIENT PHYSICAL THERAPY PROSTHETIC TREATMENT   Patient Name: Tammy [name] MRN: [id number] DOB:Feb 13, 1955, 68 y.o., female Today's Date: 03/09/2023  END OF SESSION:    Past Medical History:  Diagnosis Date   Anemia    hx of   Anxiety    on meds   Arthritis    back/bilateral ankles   Bipolar disorder (HCC)    Breast cancer (HCC) 2022   LEFT breast-radiation   Depression    on meds   Family history of breast cancer 07/10/2020   Family history of colon cancer 07/10/2020   Family history of ovarian cancer 07/10/2020   GERD (gastroesophageal reflux disease)    on meds   Heart murmur    dx by PCP   Hepatitis C    HTN (hypertension)    on meds   Hx of radiation therapy    Liver cirrhosis (HCC)    from hep c   Neuromuscular disorder (HCC)    Osteopenia    Personal history of radiation therapy    Stroke (HCC) 09/2012   slurred speech and LEFT sided weakness/notes 10/04/2012   Past Surgical History:  Procedure Laterality Date   AMPUTATION Left 11/04/2022   Procedure: LEFT ABOVE KNEE AMPUTATION;  Surgeon: Nadara Mustard, MD;  Location: [facility];  Service: Orthopedics;  Laterality: Left;   ANKLE FRACTURE SURGERY Left 2018   APPLICATION OF WOUND VAC Left 06/15/2022   Procedure: APPLICATION OF WOUND VAC;  Surgeon: Peggye Form, DO;  Location: [facility];  Service: Plastics;  Laterality: Left;   APPLICATION OF WOUND VAC Left 09/14/2022   Procedure: APPLICATION OF WOUND VAC;  Surgeon: Peggye Form, DO;  Location: [facility];  Service: Plastics;  Laterality: Left;   BREAST LUMPECTOMY Left 07/2020   BREAST LUMPECTOMY WITH RADIOACTIVE SEED AND SENTINEL LYMPH NODE BIOPSY Left 07/26/2020   Procedure: LEFT BREAST LUMPECTOMY WITH RADIOACTIVE SEED AND SENTINEL LYMPH NODE BIOPSY;  Surgeon: Abigail Miyamoto, MD;  Location: [facility];  Service: General;  Laterality: Left;   CESAREAN SECTION  1986   COLONOSCOPY     DIAGNOSTIC LAPAROSCOPY  1985    ESOPHAGOGASTRODUODENOSCOPY N/A 02/21/2023   Procedure: ESOPHAGOGASTRODUODENOSCOPY (EGD);  Surgeon: Hilarie Fredrickson, MD;  Location: [facility];  Service: Gastroenterology;  Laterality: N/A;   HARDWARE REMOVAL Left 04/30/2022   Procedure: HARDWARE REMOVAL LEFT FEMUR;  Surgeon: Samson Frederic, MD;  Location: [facility];  Service: Orthopedics;  Laterality: Left;  150   HARDWARE REMOVAL Left 11/04/2022   Procedure: HARDWARE REMOVAL INFECTED LEFT TOTAL KNEE ARTHROPLASTY;  Surgeon: Nadara Mustard, MD;  Location: [facility] OR;  Service: Orthopedics;  Laterality: Left;   I & D KNEE WITH POLY EXCHANGE Left 06/07/2022   Procedure: IRRIGATION AND DEBRIDEMENT KNEE WITH POLY EXCHANGE;  Surgeon: Samson Frederic, MD;  Location: [facility];  Service: Orthopedics;  Laterality: Left;   INCISION AND DRAINAGE OF WOUND Left 06/04/2022   Procedure: IRRIGATION AND DEBRIDEMENT KNEE;  Surgeon: Samson Frederic, MD;  Location: [facility];  Service: Orthopedics;  Laterality: Left;  60   INCISION AND DRAINAGE OF WOUND Left 06/15/2022   Procedure: excision of knee wound with Myriad;  Surgeon: Peggye Form, DO;  Location: [facility];  Service: Plastics;  Laterality: Left;   ORIF FEMUR FRACTURE Left 04/10/2021   Procedure: OPEN REDUCTION INTERNAL FIXATION (ORIF) DISTAL FEMUR FRACTURE;  Surgeon: Myrene Galas, MD;  Location: [facility];  Service: Orthopedics;  Laterality: Left;   PARTIAL KNEE ARTHROPLASTY Left 04/30/2022  Procedure: DISTAL FEMUR REPLACEMENT;  Surgeon: Samson Frederic, MD;  Location: [facility];  Service: Orthopedics;  Laterality: Left;  150   Patient Active Problem List   Diagnosis Date Noted   Left femoral vein DVT (HCC) 02/20/2023   GERD (gastroesophageal reflux disease) 02/20/2023   Acute upper gastrointestinal bleeding 02/19/2023   Bipolar 1 disorder, depressed, partial remission (HCC) 11/18/2022   Left above-knee amputee (HCC) 11/13/2022   Infection of total knee replacement (HCC) 11/04/2022   S/P AKA (above knee amputation)  unilateral, left (HCC) 11/04/2022   Chronic hepatitis C with cirrhosis (HCC) 06/06/2022   Surgical wound dehiscence, initial encounter 06/04/2022   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, left, with nonunion, subsequent encounter 04/30/2022   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 04/30/2022   Hypokalemia 04/16/2021   Hip fracture requiring operative repair (HCC) 04/16/2021   Vitamin D deficiency 04/12/2021   Hyponatremia 04/11/2021   Acute postoperative anemia due to expected blood loss 04/11/2021   Class 1 obesity 04/11/2021   Left comminuted and displaced fracture of the distal femur metaphysis 04/08/2021   History of CVA (cerebrovascular accident) 04/08/2021   Genetic testing 07/17/2020   Family history of breast cancer 07/10/2020   Family history of ovarian cancer 07/10/2020   Family history of colon cancer 07/10/2020   Malignant neoplasm of upper-outer quadrant of left breast in female, estrogen receptor positive (HCC) 07/09/2020   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   Chronic pain syndrome 10/06/2012   Nicotine dependence 10/06/2012   Anxiety state 10/05/2012   Bipolar disorder, unspecified (HCC) 10/05/2012   Slurred speech 10/04/2012   Left-sided weakness 10/04/2012   Hyperkalemia 10/04/2012   Acute CVA (cerebrovascular accident) (HCC) 10/04/2012   Essential hypertension     PCP: Quitman Livings, MD  REFERRING PROVIDER: Aldean Baker, MD  ONSET DATE: 01/25/2023 prosthesis delivery  REFERRING DIAG: Z61.096 (ICD-10-CM) - Hx of AKA (above knee amputation), left   THERAPY DIAG:  No diagnosis found.  Rationale for Evaluation and Treatment: Rehabilitation  SUBJECTIVE:   SUBJECTIVE STATEMENT: ***    Still working on balance at home, felt good after last session.  Has some pain from the socket of the prosthesis but no sores. Has appt at [facility] tomorrow for adjustments.   Pt accompanied by: [name],  significant other  PERTINENT HISTORY: AKA, anemia, anxiety, arthritis, Bipolar disorder, Breast CA left with radiation, HTN, Hep C, liver cirrhosis, Neuromuscular disorder, osteopenia, stroke Left hemiparesis 2014  PAIN:  Are you having pain? Yes, no rate given. "It does not stop me from working with therapy"   Pain location: hip and back from the socket fitting improperly Pain description: throbbing Aggravating factors: standing or walking too much, or twisting motion Relieving factors: meds or medicated lotions  PRECAUTIONS: Fall  WEIGHT BEARING RESTRICTIONS: No  FALLS: Has patient fallen in last 6 months? Yes. Number of falls 1 no injuries  LIVING ENVIRONMENT: Lives with: lives with their spouse Lives in: Mobile home Home Access: Ramped entrance or 4 steps with double rail to [employer] layout: One level Has following equipment at home: Single point cane, Environmental consultant - 2 wheeled, Wheelchair (manual), Shower bench, bed side commode, Grab bars, and Ramped entry  OCCUPATION: retired from nursing  PLOF: Independent prior to Fracture 04/10/2021.  Since fracture was able to get to cane for community until hardware broke.    PATIENT GOALS:  walk independently in community, travel, drive, take care of 4yo great granddaughter  OBJECTIVE:  COGNITION: Overall cognitive status: Within functional limits for tasks assessed  POSTURE: rounded shoulders, forward head, flexed trunk , and weight shift right  LOWER EXTREMITY ROM:  ROM P:passive  A:active Right eval Left eval  Hip flexion    Hip extension    Hip abduction    Hip adduction    Hip internal rotation    Hip external rotation    Knee flexion    Knee extension    Ankle dorsiflexion    Ankle plantarflexion    Ankle inversion    Ankle eversion     (Blank rows = not tested)  LOWER EXTREMITY MMT:  MMT Right eval Left eval  Hip flexion    Hip extension    Hip abduction    Hip adduction    Hip internal rotation    Hip  external rotation    Knee flexion    Knee extension    Ankle dorsiflexion    Ankle plantarflexion    Ankle inversion    Ankle eversion    At Evaluation all strength testing is grossly seated and functionally standing / gait. (Blank rows = not tested)  TRANSFERS: Evaluation on 01/25/2023:  Sit to stand: SBA w/c to RW requires armrests on w/c and stabilization with RW.   Stand to sit: SBA RW to w/c requires armrests on w/c and stabilization with RW.    FUNCTIONAL TESTs:  03/02/2023: Sharlene Motts Balance 22/56     Eval: Berg Balance Scale: 15/56   GAIT: 01/26/2023: Patient ambulated 10 feet with rolling walker with minA but required constant cueing for technique and safety.  CURRENT PROSTHETIC WEAR ASSESSMENT: Evaluation on 01/25/2023:  Patient is dependent with: skin check, residual limb care, care of non-amputated limb, prosthetic cleaning, ply sock cleaning, correct ply sock adjustment, proper wear schedule/adjustment, and proper weight-bearing schedule/adjustment Donning prosthesis: Min A Doffing prosthesis: SBA Prosthetic wear tolerance: tolerated wear 1 hour prior to PT evaluation.  Prosthetic weight bearing tolerance: 5 minutes with BUE support on RW with limited wear on prosthesis.  Edema: pitting Prosthetic description: Ischial Containment socket with flexible inner socket, silicon liner with velcro strap suspension.   TODAY'S TREATMENT:                                                                                                                             DATE:  03/09/2023: Prosthetic Training with Transfemoral Prosthesis: ***   TREATMENT:  DATE:  03/04/2023: Prosthetic Training with Transfemoral Prosthesis: See HEP section. Demonstration, VC, TC, and visual cues used with patient able to replicate activity and verbalized understanding. Patient  required VC for proper weight shift to prosthesis via hips instead of trunk, use of sink to hold on to for safety with activity, and maintaining appropriate feet distance with proper stacked posture of hip over prosthesis. Verbal cues to begin to feel proprioception of LLE with socket - limb interface with HEP exercises.     TREATMENT:                                                                                                                             DATE:  03/02/2023: Prosthetic Training with Transfemoral Prosthesis: -Pt able to don/doff pants with minimal assist demonstrating weight shift and alternating UE support on walker. Required adjustment of strap to ensure snug fit of prosthesis.  -PT demo & verbal cues on how to pick up an object from floor keeping prosthetic knee extended for balance assistance and setup for safety to practice at home.  Patient return demo 2 reps with supervision using BUE support to abducted prosthesis & stabilize upon arising.  Patient verbalized understanding and returned demonstration with supervision for safety.  Her fiance verbalized how to provide supervision.  -Ambulation with RW 5ft with 180deg turn to R. VC for increased step through pattern especially on R. After bout:  99% O2 and 80 HR -Ambulation to elevator ~127ft with 90* turn R, pt able to push button for door without UE assist on walker and turn to L before walking out of door with plenty of time before door shut. Continued VC for step through with R.   Physical Performance Testing: -See objective for Berg Balance  22/56    HOME EXERCISE PROGRAM: Do each exercise 1-2  times per day Do each exercise 5-10 repetitions Hold each exercise for 2 seconds to feel your location  AT SINK FIND YOUR MIDLINE POSITION AND PLACE FEET EQUAL DISTANCE FROM THE MIDLINE.  Try to find this position when standing still for activities.   USE TAPE ON FLOOR TO MARK THE MIDLINE POSITION which is even with middle of  sink.  You also should try to feel with your limb pressure in socket.  You are trying to feel with limb what you used to feel with the bottom of your foot.  Side to Side Shift: Moving your hips only (not shoulders): move weight onto your left leg, HOLD/FEEL pressure in socket.  Move back to equal weight on each leg, HOLD/FEEL pressure in socket. Move weight onto your right leg, HOLD/FEEL pressure in socket. Move back to equal weight on each leg, HOLD/FEEL pressure in socket. Repeat.  Start with both hands on sink, progress to hand on prosthetic side only, then no hands.  Front to Back Shift: Moving your hips only (not shoulders): move your weight forward onto your toes, HOLD/FEEL pressure in socket. Move your  weight back to equal Flat Foot on both legs, HOLD/FEEL  pressure in socket. Move your weight back onto your heels, HOLD/FEEL  pressure in socket. Move your weight back to equal on both legs, HOLD/FEEL  pressure in socket. Repeat.  Start with both hands on sink, progress to hand on prosthetic side only, then no hands.  Moving Cones / Cups: With equal weight on each leg: Hold on with one hand the first time, then progress to no hand supports. Move cups from one side of sink to the other. Place cups ~2" out of your reach, progress to 10" beyond reach.  Place one hand in middle of sink and reach with other hand. Do both arms.  Then hover one hand and move cups with other hand.  Overhead/Upward Reaching: alternated reaching up to top cabinets or ceiling if no cabinets present. Keep equal weight on each leg. Start with one hand support on counter while other hand reaches and progress to no hand support with reaching.  ace one hand in middle of sink and reach with other hand. Do both arms.  Then hover one hand and move cups with other hand.  5.   Looking Over Shoulders: With equal weight on each leg: alternate turning to look over your shoulders with one hand support on counter as needed.  Start with head  motions only to look in front of shoulder, then even with shoulder and progress to looking behind you. To look to side, move head /eyes, then shoulder on side looking pulls back, shift more weight to side looking and pull hip back. Place one hand in middle of sink and let go with other hand so your shoulder can pull back. Switch hands to look other way.   Then hover one hand and look over shoulder. If looking right, use left hand at sink. If looking left, use right hand at sink. 6.  Stepping with leg that is not amputated:  Move items under cabinet out of your way. Shift your hips/pelvis so weight on prosthesis. Tighten muscles in hip on prosthetic side.  SLOWLY step other leg so front of foot is in cabinet. Then step back to floor.    ASSESSMENT:  CLINICAL IMPRESSION: ***   Patient did well with introduction of HEP for balance. Patient required consistent VC for proper LE set up before attempting activity. Deficits with appropriate weight shift noted requiring TC for patient to improve shift to prosthesis side. Patient demonstrated safety with activities on HEP for safe practice at home. Patient continues to make progress towards goals and will benefit from continued skilled physical therapy to address residual deficits.    OBJECTIVE IMPAIRMENTS: Abnormal gait, decreased activity tolerance, decreased balance, decreased endurance, decreased knowledge of condition, decreased knowledge of use of DME, decreased mobility, difficulty walking, decreased ROM, decreased strength, increased edema, impaired flexibility, postural dysfunction, prosthetic dependency , obesity, and pain.   ACTIVITY LIMITATIONS: carrying, lifting, bending, sitting, standing, stairs, transfers, and locomotion level  PARTICIPATION LIMITATIONS: meal prep, cleaning, and community activity  PERSONAL FACTORS: Fitness, Time since onset of injury/illness/exacerbation, and 3+ comorbidities: see PMH  are also affecting patient's functional  outcome.   REHAB POTENTIAL: Good  CLINICAL DECISION MAKING: Evolving/moderate complexity  EVALUATION COMPLEXITY: Moderate   GOALS: Goals reviewed with patient? Yes  SHORT TERM GOALS: Target date: 02/25/2023  Patient donnes prosthesis modified independent & verbalizes proper cleaning. Baseline: SEE OBJECTIVE DATA Goal status: MET 02/25/2023 2.  Patient tolerates prosthesis >10 hrs total /day without skin  issues or limb pain after standing. Baseline: SEE OBJECTIVE DATA Goal status: MET 02/25/2023  3.  Berg Balance >20/56 Baseline: SEE OBJECTIVE DATA Goal status: MET 03/02/2023  4. Patient ambulates 13' with RW & prosthesis with supervision. Baseline: SEE OBJECTIVE DATA Goal status: Ongoing 03/09/2023  5. Patient negotiates ramps & curbs with RW & prosthesis with minA. Baseline: SEE OBJECTIVE DATA Goal status: Ongoing  03/09/2023  LONG TERM GOALS: Target date: 04/22/2023  Patient demonstrates & verbalized understanding of prosthetic care to enable safe utilization of prosthesis. Baseline: SEE OBJECTIVE DATA Goal status: Ongoing    03/09/2023  Patient tolerates prosthesis wear >90% of awake hours without skin or residual limb pain issues. Baseline: SEE OBJECTIVE DATA Goal status: Ongoing    03/09/2023  Berg Balance >35/56 to indicate lower fall risk Baseline: SEE OBJECTIVE DATA Goal status: Ongoing  03/09/2023  Patient ambulates >300' with prosthesis and cane or less modified independent. Baseline: SEE OBJECTIVE DATA Goal status: Ongoing   03/09/2023  Patient negotiates ramps, curbs & stairs with single rail with prosthesis  and cane or less modified independent. Baseline: SEE OBJECTIVE DATA Goal status: Ongoing   03/09/2023   Patient reports right knee pain </= 2/10 with standing and gait activities.   Baseline: SEE OBJECTIVE DATA Goal status:   Ongoing  03/09/2023  PLAN:  PT FREQUENCY:  3 x for 1 week, then 2x/week  PT DURATION: 90 days / 13 weeks  PLANNED  INTERVENTIONS: 97164- PT Re-evaluation, 97110-Therapeutic exercises, 97530- Therapeutic activity, [id number]- Neuromuscular re-education, [phone]- Self Care, [id number]- Gait training, [phone]- Prosthetic training, Patient/Family education, Balance training, Stair training, DME instructions, and physical performance testing  PLAN FOR NEXT SESSION:  ***   check remaining STGs.  Blazepods for dynamic balance with varied heights/distance. Continue gait training with RW.     Vladimir Faster, PT, DPT 03/09/2023, 7:45 AM

## 2023-03-11 ENCOUNTER — Encounter: Payer: Medicare Other | Admitting: Physical Therapy

## 2023-03-11 ENCOUNTER — Telehealth: Payer: Self-pay | Admitting: Physical Therapy

## 2023-03-11 NOTE — Therapy (Incomplete)
OUTPATIENT PHYSICAL THERAPY PROSTHETIC TREATMENT   Patient Name: Tammy [name] MRN: [id number] DOB:[birth year], 68 y.o., female Today's Date: 03/11/2023  END OF SESSION:    Past Medical History:  Diagnosis Date   Anemia    hx of   Anxiety    on meds   Arthritis    back/bilateral ankles   Bipolar disorder (HCC)    Breast cancer (HCC) 2022   LEFT breast-radiation   Depression    on meds   Family history of breast cancer 07/10/2020   Family history of colon cancer 07/10/2020   Family history of ovarian cancer 07/10/2020   GERD (gastroesophageal reflux disease)    on meds   Heart murmur    dx by PCP   Hepatitis C    HTN (hypertension)    on meds   Hx of radiation therapy    Liver cirrhosis (HCC)    from hep c   Neuromuscular disorder (HCC)    Osteopenia    Personal history of radiation therapy    Stroke (HCC) 09/2012   slurred speech and LEFT sided weakness/notes 10/04/2012   Past Surgical History:  Procedure Laterality Date   AMPUTATION Left 11/04/2022   Procedure: LEFT ABOVE KNEE AMPUTATION;  Surgeon: Nadara Mustard, MD;  Location: [facility] OR;  Service: Orthopedics;  Laterality: Left;   ANKLE FRACTURE SURGERY Left 2018   APPLICATION OF WOUND VAC Left 06/15/2022   Procedure: APPLICATION OF WOUND VAC;  Surgeon: Peggye Form, DO;  Location: [facility];  Service: Plastics;  Laterality: Left;   APPLICATION OF WOUND VAC Left 09/14/2022   Procedure: APPLICATION OF WOUND VAC;  Surgeon: Peggye Form, DO;  Location: [facility] OR;  Service: Plastics;  Laterality: Left;   BREAST LUMPECTOMY Left 07/2020   BREAST LUMPECTOMY WITH RADIOACTIVE SEED AND SENTINEL LYMPH NODE BIOPSY Left 07/26/2020   Procedure: LEFT BREAST LUMPECTOMY WITH RADIOACTIVE SEED AND SENTINEL LYMPH NODE BIOPSY;  Surgeon: Abigail Miyamoto, MD;  Location: [facility];  Service: General;  Laterality: Left;   CESAREAN SECTION  1986   COLONOSCOPY     DIAGNOSTIC LAPAROSCOPY  1985    ESOPHAGOGASTRODUODENOSCOPY N/A 02/21/2023   Procedure: ESOPHAGOGASTRODUODENOSCOPY (EGD);  Surgeon: Hilarie Fredrickson, MD;  Location: [facility];  Service: Gastroenterology;  Laterality: N/A;   HARDWARE REMOVAL Left 04/30/2022   Procedure: HARDWARE REMOVAL LEFT FEMUR;  Surgeon: Samson Frederic, MD;  Location: [facility];  Service: Orthopedics;  Laterality: Left;  150   HARDWARE REMOVAL Left 11/04/2022   Procedure: HARDWARE REMOVAL INFECTED LEFT TOTAL KNEE ARTHROPLASTY;  Surgeon: Nadara Mustard, MD;  Location: [facility] OR;  Service: Orthopedics;  Laterality: Left;   I & D KNEE WITH POLY EXCHANGE Left 06/07/2022   Procedure: IRRIGATION AND DEBRIDEMENT KNEE WITH POLY EXCHANGE;  Surgeon: Samson Frederic, MD;  Location: [facility];  Service: Orthopedics;  Laterality: Left;   INCISION AND DRAINAGE OF WOUND Left 06/04/2022   Procedure: IRRIGATION AND DEBRIDEMENT KNEE;  Surgeon: Samson Frederic, MD;  Location: [facility];  Service: Orthopedics;  Laterality: Left;  60   INCISION AND DRAINAGE OF WOUND Left 06/15/2022   Procedure: excision of knee wound with Myriad;  Surgeon: Peggye Form, DO;  Location: [facility];  Service: Plastics;  Laterality: Left;   ORIF FEMUR FRACTURE Left 04/10/2021   Procedure: OPEN REDUCTION INTERNAL FIXATION (ORIF) DISTAL FEMUR FRACTURE;  Surgeon: Myrene Galas, MD;  Location: [facility] OR;  Service: Orthopedics;  Laterality: Left;   PARTIAL KNEE ARTHROPLASTY Left 04/30/2022  Procedure: DISTAL FEMUR REPLACEMENT;  Surgeon: Samson Frederic, MD;  Location: [facility];  Service: Orthopedics;  Laterality: Left;  150   Patient Active Problem List   Diagnosis Date Noted   Left femoral vein DVT (HCC) 02/20/2023   GERD (gastroesophageal reflux disease) 02/20/2023   Acute upper gastrointestinal bleeding 02/19/2023   Bipolar 1 disorder, depressed, partial remission (HCC) 11/18/2022   Left above-knee amputee (HCC) 11/13/2022   Infection of total knee replacement (HCC) 11/04/2022   S/P AKA (above knee amputation)  unilateral, left (HCC) 11/04/2022   Chronic hepatitis C with cirrhosis (HCC) 06/06/2022   Surgical wound dehiscence, initial encounter 06/04/2022   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, left, with nonunion, subsequent encounter 04/30/2022   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 04/30/2022   Hypokalemia 04/16/2021   Hip fracture requiring operative repair (HCC) 04/16/2021   Vitamin D deficiency 04/12/2021   Hyponatremia 04/11/2021   Acute postoperative anemia due to expected blood loss 04/11/2021   Class 1 obesity 04/11/2021   Left comminuted and displaced fracture of the distal femur metaphysis 04/08/2021   History of CVA (cerebrovascular accident) 04/08/2021   Genetic testing 07/17/2020   Family history of breast cancer 07/10/2020   Family history of ovarian cancer 07/10/2020   Family history of colon cancer 07/10/2020   Malignant neoplasm of upper-outer quadrant of left breast in female, estrogen receptor positive (HCC) 07/09/2020   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   Chronic pain syndrome 10/06/2012   Nicotine dependence 10/06/2012   Anxiety state 10/05/2012   Bipolar disorder, unspecified (HCC) 10/05/2012   Slurred speech 10/04/2012   Left-sided weakness 10/04/2012   Hyperkalemia 10/04/2012   Acute CVA (cerebrovascular accident) (HCC) 10/04/2012   Essential hypertension     PCP: Quitman Livings, MD  REFERRING PROVIDER: Aldean Baker, MD  ONSET DATE: 01/25/2023 prosthesis delivery  REFERRING DIAG: W09.811 (ICD-10-CM) - Hx of AKA (above knee amputation), left   THERAPY DIAG:  No diagnosis found.  Rationale for Evaluation and Treatment: Rehabilitation  SUBJECTIVE:   SUBJECTIVE STATEMENT: Ask about picking up items from floor and how reaching HEP has been going  ***    Still working on balance at home, felt good after last session.  Has some pain from the socket of the prosthesis but no sores.  Has appt at [facility] tomorrow for adjustments.   Pt accompanied by: [name], significant other  PERTINENT HISTORY: AKA, anemia, anxiety, arthritis, Bipolar disorder, Breast CA left with radiation, HTN, Hep C, liver cirrhosis, Neuromuscular disorder, osteopenia, stroke Left hemiparesis 2014  PAIN:  Are you having pain? Yes, no rate given. "It does not stop me from working with therapy"   Pain location: hip and back from the socket fitting improperly Pain description: throbbing Aggravating factors: standing or walking too much, or twisting motion Relieving factors: meds or medicated lotions  PRECAUTIONS: Fall  WEIGHT BEARING RESTRICTIONS: No  FALLS: Has patient fallen in last 6 months? Yes. Number of falls 1 no injuries  LIVING ENVIRONMENT: Lives with: lives with their spouse Lives in: Mobile home Home Access: Ramped entrance or 4 steps with double rail to [employer] layout: One level Has following equipment at home: Single point cane, Environmental consultant - 2 wheeled, Wheelchair (manual), Shower bench, bed side commode, Grab bars, and Ramped entry  OCCUPATION: retired from nursing  PLOF: Independent prior to Fracture 04/10/2021.  Since fracture was able to get to cane for community until hardware broke.    PATIENT GOALS:  walk independently in community, travel, drive, take care of 4yo great granddaughter  OBJECTIVE:  COGNITION: Overall cognitive status: Within functional limits for tasks assessed  POSTURE: rounded shoulders, forward head, flexed trunk , and weight shift right  LOWER EXTREMITY ROM:  ROM P:passive  A:active Right eval Left eval  Hip flexion    Hip extension    Hip abduction    Hip adduction    Hip internal rotation    Hip external rotation    Knee flexion    Knee extension    Ankle dorsiflexion    Ankle plantarflexion    Ankle inversion    Ankle eversion     (Blank rows = not tested)  LOWER EXTREMITY MMT:  MMT Right eval Left eval  Hip flexion    Hip  extension    Hip abduction    Hip adduction    Hip internal rotation    Hip external rotation    Knee flexion    Knee extension    Ankle dorsiflexion    Ankle plantarflexion    Ankle inversion    Ankle eversion    At Evaluation all strength testing is grossly seated and functionally standing / gait. (Blank rows = not tested)  TRANSFERS: Evaluation on 01/25/2023:  Sit to stand: SBA w/c to RW requires armrests on w/c and stabilization with RW.   Stand to sit: SBA RW to w/c requires armrests on w/c and stabilization with RW.    FUNCTIONAL TESTs:  03/02/2023: Sharlene Motts Balance 22/56     Eval: Berg Balance Scale: 15/56   GAIT: 01/26/2023: Patient ambulated 10 feet with rolling walker with minA but required constant cueing for technique and safety.  CURRENT PROSTHETIC WEAR ASSESSMENT: Evaluation on 01/25/2023:  Patient is dependent with: skin check, residual limb care, care of non-amputated limb, prosthetic cleaning, ply sock cleaning, correct ply sock adjustment, proper wear schedule/adjustment, and proper weight-bearing schedule/adjustment Donning prosthesis: Min A Doffing prosthesis: SBA Prosthetic wear tolerance: tolerated wear 1 hour prior to PT evaluation.  Prosthetic weight bearing tolerance: 5 minutes with BUE support on RW with limited wear on prosthesis.  Edema: pitting Prosthetic description: Ischial Containment socket with flexible inner socket, silicon liner with velcro strap suspension.   TODAY'S TREATMENT:                                                                                                                             DATE:  03/09/2023: Prosthetic Training with Transfemoral Prosthesis: ***   TREATMENT:  DATE:  03/04/2023: Prosthetic Training with Transfemoral Prosthesis: See HEP section. Demonstration, VC, TC, and visual cues used with  patient able to replicate activity and verbalized understanding. Patient required VC for proper weight shift to prosthesis via hips instead of trunk, use of sink to hold on to for safety with activity, and maintaining appropriate feet distance with proper stacked posture of hip over prosthesis. Verbal cues to begin to feel proprioception of LLE with socket - limb interface with HEP exercises.     TREATMENT:                                                                                                                             DATE:  03/02/2023: Prosthetic Training with Transfemoral Prosthesis: -Pt able to don/doff pants with minimal assist demonstrating weight shift and alternating UE support on walker. Required adjustment of strap to ensure snug fit of prosthesis.  -PT demo & verbal cues on how to pick up an object from floor keeping prosthetic knee extended for balance assistance and setup for safety to practice at home.  Patient return demo 2 reps with supervision using BUE support to abducted prosthesis & stabilize upon arising.  Patient verbalized understanding and returned demonstration with supervision for safety.  Her fiance verbalized how to provide supervision.  -Ambulation with RW 8ft with 180deg turn to R. VC for increased step through pattern especially on R. After bout:  99% O2 and 80 HR -Ambulation to elevator ~121ft with 90* turn R, pt able to push button for door without UE assist on walker and turn to L before walking out of door with plenty of time before door shut. Continued VC for step through with R.   Physical Performance Testing: -See objective for Berg Balance  22/56    HOME EXERCISE PROGRAM: Do each exercise 1-2  times per day Do each exercise 5-10 repetitions Hold each exercise for 2 seconds to feel your location  AT SINK FIND YOUR MIDLINE POSITION AND PLACE FEET EQUAL DISTANCE FROM THE MIDLINE.  Try to find this position when standing still for activities.   USE  TAPE ON FLOOR TO MARK THE MIDLINE POSITION which is even with middle of sink.  You also should try to feel with your limb pressure in socket.  You are trying to feel with limb what you used to feel with the bottom of your foot.  Side to Side Shift: Moving your hips only (not shoulders): move weight onto your left leg, HOLD/FEEL pressure in socket.  Move back to equal weight on each leg, HOLD/FEEL pressure in socket. Move weight onto your right leg, HOLD/FEEL pressure in socket. Move back to equal weight on each leg, HOLD/FEEL pressure in socket. Repeat.  Start with both hands on sink, progress to hand on prosthetic side only, then no hands.  Front to Back Shift: Moving your hips only (not shoulders): move your weight forward onto your toes, HOLD/FEEL pressure in socket. Move your  weight back to equal Flat Foot on both legs, HOLD/FEEL  pressure in socket. Move your weight back onto your heels, HOLD/FEEL  pressure in socket. Move your weight back to equal on both legs, HOLD/FEEL  pressure in socket. Repeat.  Start with both hands on sink, progress to hand on prosthetic side only, then no hands.  Moving Cones / Cups: With equal weight on each leg: Hold on with one hand the first time, then progress to no hand supports. Move cups from one side of sink to the other. Place cups ~2" out of your reach, progress to 10" beyond reach.  Place one hand in middle of sink and reach with other hand. Do both arms.  Then hover one hand and move cups with other hand.  Overhead/Upward Reaching: alternated reaching up to top cabinets or ceiling if no cabinets present. Keep equal weight on each leg. Start with one hand support on counter while other hand reaches and progress to no hand support with reaching.  ace one hand in middle of sink and reach with other hand. Do both arms.  Then hover one hand and move cups with other hand.  5.   Looking Over Shoulders: With equal weight on each leg: alternate turning to look over your  shoulders with one hand support on counter as needed.  Start with head motions only to look in front of shoulder, then even with shoulder and progress to looking behind you. To look to side, move head /eyes, then shoulder on side looking pulls back, shift more weight to side looking and pull hip back. Place one hand in middle of sink and let go with other hand so your shoulder can pull back. Switch hands to look other way.   Then hover one hand and look over shoulder. If looking right, use left hand at sink. If looking left, use right hand at sink. 6.  Stepping with leg that is not amputated:  Move items under cabinet out of your way. Shift your hips/pelvis so weight on prosthesis. Tighten muscles in hip on prosthetic side.  SLOWLY step other leg so front of foot is in cabinet. Then step back to floor.    ASSESSMENT:  CLINICAL IMPRESSION: ***   Patient did well with introduction of HEP for balance. Patient required consistent VC for proper LE set up before attempting activity. Deficits with appropriate weight shift noted requiring TC for patient to improve shift to prosthesis side. Patient demonstrated safety with activities on HEP for safe practice at home. Patient continues to make progress towards goals and will benefit from continued skilled physical therapy to address residual deficits.    OBJECTIVE IMPAIRMENTS: Abnormal gait, decreased activity tolerance, decreased balance, decreased endurance, decreased knowledge of condition, decreased knowledge of use of DME, decreased mobility, difficulty walking, decreased ROM, decreased strength, increased edema, impaired flexibility, postural dysfunction, prosthetic dependency , obesity, and pain.   ACTIVITY LIMITATIONS: carrying, lifting, bending, sitting, standing, stairs, transfers, and locomotion level  PARTICIPATION LIMITATIONS: meal prep, cleaning, and community activity  PERSONAL FACTORS: Fitness, Time since onset of injury/illness/exacerbation,  and 3+ comorbidities: see PMH  are also affecting patient's functional outcome.   REHAB POTENTIAL: Good  CLINICAL DECISION MAKING: Evolving/moderate complexity  EVALUATION COMPLEXITY: Moderate   GOALS: Goals reviewed with patient? Yes  SHORT TERM GOALS: Target date: 02/25/2023  Patient donnes prosthesis modified independent & verbalizes proper cleaning. Baseline: SEE OBJECTIVE DATA Goal status: MET 02/25/2023 2.  Patient tolerates prosthesis >10 hrs total /day without skin  issues or limb pain after standing. Baseline: SEE OBJECTIVE DATA Goal status: MET 02/25/2023  3.  Berg Balance >20/56 Baseline: SEE OBJECTIVE DATA Goal status: MET 03/02/2023  4. Patient ambulates 78' with RW & prosthesis with supervision. Baseline: SEE OBJECTIVE DATA Goal status: Ongoing 03/09/2023  5. Patient negotiates ramps & curbs with RW & prosthesis with minA. Baseline: SEE OBJECTIVE DATA Goal status: Ongoing  03/09/2023  LONG TERM GOALS: Target date: 04/22/2023  Patient demonstrates & verbalized understanding of prosthetic care to enable safe utilization of prosthesis. Baseline: SEE OBJECTIVE DATA Goal status: Ongoing    03/09/2023  Patient tolerates prosthesis wear >90% of awake hours without skin or residual limb pain issues. Baseline: SEE OBJECTIVE DATA Goal status: Ongoing    03/09/2023  Berg Balance >35/56 to indicate lower fall risk Baseline: SEE OBJECTIVE DATA Goal status: Ongoing  03/09/2023  Patient ambulates >300' with prosthesis and cane or less modified independent. Baseline: SEE OBJECTIVE DATA Goal status: Ongoing   03/09/2023  Patient negotiates ramps, curbs & stairs with single rail with prosthesis  and cane or less modified independent. Baseline: SEE OBJECTIVE DATA Goal status: Ongoing   03/09/2023   Patient reports right knee pain </= 2/10 with standing and gait activities.   Baseline: SEE OBJECTIVE DATA Goal status:   Ongoing  03/09/2023  PLAN:  PT FREQUENCY:  3 x for 1  week, then 2x/week  PT DURATION: 90 days / 13 weeks  PLANNED INTERVENTIONS: 97164- PT Re-evaluation, 97110-Therapeutic exercises, 97530- Therapeutic activity, [id number]- Neuromuscular re-education, [phone]- Self Care, [id number]- Gait training, [phone]- Prosthetic training, Patient/Family education, Balance training, Stair training, DME instructions, and physical performance testing  PLAN FOR NEXT SESSION:  ***   check remaining STGs.  Blazepods for dynamic balance with varied heights/distance. Continue gait training with RW.     Atlee Abide, Student-PT, DPT 03/11/2023, 10:48 AM

## 2023-03-11 NOTE — Telephone Encounter (Signed)
PT called patient regarding no-show appointment.  Her voicemail box was full so unable to leave a message.

## 2023-03-16 ENCOUNTER — Encounter: Payer: Self-pay | Admitting: Physical Therapy

## 2023-03-16 ENCOUNTER — Ambulatory Visit (INDEPENDENT_AMBULATORY_CARE_PROVIDER_SITE_OTHER): Payer: Medicare Other | Admitting: Physical Therapy

## 2023-03-16 DIAGNOSIS — R262 Difficulty in walking, not elsewhere classified: Secondary | ICD-10-CM

## 2023-03-16 DIAGNOSIS — Z89612 Acquired absence of left leg above knee: Secondary | ICD-10-CM | POA: Diagnosis not present

## 2023-03-16 DIAGNOSIS — M6281 Muscle weakness (generalized): Secondary | ICD-10-CM | POA: Diagnosis not present

## 2023-03-16 DIAGNOSIS — R2689 Other abnormalities of gait and mobility: Secondary | ICD-10-CM | POA: Diagnosis not present

## 2023-03-16 DIAGNOSIS — R2681 Unsteadiness on feet: Secondary | ICD-10-CM

## 2023-03-16 NOTE — Therapy (Addendum)
 OUTPATIENT PHYSICAL THERAPY PROSTHETIC TREATMENT   Patient Name: Tammy [name] MRN: [id number] DOB:1955-05-17, 68 y.o., female Today's Date: 03/16/2023  END OF SESSION:  PT End of Session - 03/16/23 1057     Visit Number 9    Number of Visits 27    Date for PT Re-Evaluation 04/22/23    Authorization Type [employer]    Authorization Time Period $20 COPAY 16 PT VISITS APPROVED 12/16-2/11/2023    Authorization - Visit Number 9    Authorization - Number of Visits 16    Progress Note Due on Visit 10    PT Start Time 1100    PT Stop Time 1145    PT Time Calculation (min) 45 min    Equipment Utilized During Treatment Gait belt    Activity Tolerance Patient tolerated treatment well    Behavior During Therapy WFL for tasks assessed/performed              Past Medical History:  Diagnosis Date   Anemia    hx of   Anxiety    on meds   Arthritis    back/bilateral ankles   Bipolar disorder (HCC)    Breast cancer (HCC) 2022   LEFT breast-radiation   Depression    on meds   Family history of breast cancer 07/10/2020   Family history of colon cancer 07/10/2020   Family history of ovarian cancer 07/10/2020   GERD (gastroesophageal reflux disease)    on meds   Heart murmur    dx by PCP   Hepatitis C    HTN (hypertension)    on meds   Hx of radiation therapy    Liver cirrhosis (HCC)    from hep c   Neuromuscular disorder (HCC)    Osteopenia    Personal history of radiation therapy    Stroke (HCC) 09/2012   slurred speech and LEFT sided weakness/notes 10/04/2012   Past Surgical History:  Procedure Laterality Date   AMPUTATION Left 11/04/2022   Procedure: LEFT ABOVE KNEE AMPUTATION;  Surgeon: Harden Jerona GAILS, MD;  Location: [facility] OR;  Service: Orthopedics;  Laterality: Left;   ANKLE FRACTURE SURGERY Left 2018   APPLICATION OF WOUND VAC Left 06/15/2022   Procedure: APPLICATION OF WOUND VAC;  Surgeon: Lowery Estefana RAMAN, DO;  Location: [facility] ORS;  Service:  Plastics;  Laterality: Left;   APPLICATION OF WOUND VAC Left 09/14/2022   Procedure: APPLICATION OF WOUND VAC;  Surgeon: Lowery Estefana RAMAN, DO;  Location: [facility] OR;  Service: Plastics;  Laterality: Left;   BREAST LUMPECTOMY Left 07/2020   BREAST LUMPECTOMY WITH RADIOACTIVE SEED AND SENTINEL LYMPH NODE BIOPSY Left 07/26/2020   Procedure: LEFT BREAST LUMPECTOMY WITH RADIOACTIVE SEED AND SENTINEL LYMPH NODE BIOPSY;  Surgeon: Vernetta Berg, MD;  Location: [facility];  Service: General;  Laterality: Left;   CESAREAN SECTION  1986   COLONOSCOPY     DIAGNOSTIC LAPAROSCOPY  1985   ESOPHAGOGASTRODUODENOSCOPY N/A 02/21/2023   Procedure: ESOPHAGOGASTRODUODENOSCOPY (EGD);  Surgeon: Abran Norleen SAILOR, MD;  Location: [facility];  Service: Gastroenterology;  Laterality: N/A;   HARDWARE REMOVAL Left 04/30/2022   Procedure: HARDWARE REMOVAL LEFT FEMUR;  Surgeon: Fidel Rogue, MD;  Location: [facility];  Service: Orthopedics;  Laterality: Left;  150   HARDWARE REMOVAL Left 11/04/2022   Procedure: HARDWARE REMOVAL INFECTED LEFT TOTAL KNEE ARTHROPLASTY;  Surgeon: Harden Jerona GAILS, MD;  Location: [facility] OR;  Service: Orthopedics;  Laterality: Left;   I & D KNEE WITH POLY  EXCHANGE Left 06/07/2022   Procedure: IRRIGATION AND DEBRIDEMENT KNEE WITH POLY EXCHANGE;  Surgeon: Fidel Rogue, MD;  Location: [facility];  Service: Orthopedics;  Laterality: Left;   INCISION AND DRAINAGE OF WOUND Left 06/04/2022   Procedure: IRRIGATION AND DEBRIDEMENT KNEE;  Surgeon: Fidel Rogue, MD;  Location: [facility];  Service: Orthopedics;  Laterality: Left;  60   INCISION AND DRAINAGE OF WOUND Left 06/15/2022   Procedure: excision of knee wound with Myriad;  Surgeon: Lowery Estefana RAMAN, DO;  Location: [facility];  Service: Plastics;  Laterality: Left;   ORIF FEMUR FRACTURE Left 04/10/2021   Procedure: OPEN REDUCTION INTERNAL FIXATION (ORIF) DISTAL FEMUR FRACTURE;  Surgeon: Celena Sharper, MD;  Location: [facility] OR;  Service: Orthopedics;   Laterality: Left;   PARTIAL KNEE ARTHROPLASTY Left 04/30/2022   Procedure: DISTAL FEMUR REPLACEMENT;  Surgeon: Fidel Rogue, MD;  Location: [facility];  Service: Orthopedics;  Laterality: Left;  150   Patient Active Problem List   Diagnosis Date Noted   Left femoral vein DVT (HCC) 02/20/2023   GERD (gastroesophageal reflux disease) 02/20/2023   Acute upper gastrointestinal bleeding 02/19/2023   Bipolar 1 disorder, depressed, partial remission (HCC) 11/18/2022   Left above-knee amputee (HCC) 11/13/2022   Infection of total knee replacement (HCC) 11/04/2022   S/P AKA (above knee amputation) unilateral, left (HCC) 11/04/2022   Chronic hepatitis C with cirrhosis (HCC) 06/06/2022   Surgical wound dehiscence, initial encounter 06/04/2022   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, left, with nonunion, subsequent encounter 04/30/2022   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 04/30/2022   Hypokalemia 04/16/2021   Hip fracture requiring operative repair (HCC) 04/16/2021   Vitamin D  deficiency 04/12/2021   Hyponatremia 04/11/2021   Acute postoperative anemia due to expected blood loss 04/11/2021   Class 1 obesity 04/11/2021   Left comminuted and displaced fracture of the distal femur metaphysis 04/08/2021   History of CVA (cerebrovascular accident) 04/08/2021   Genetic testing 07/17/2020   Family history of breast cancer 07/10/2020   Family history of ovarian cancer 07/10/2020   Family history of colon cancer 07/10/2020   Malignant neoplasm of upper-outer quadrant of left breast in female, estrogen receptor positive (HCC) 07/09/2020   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   Chronic pain syndrome 10/06/2012   Nicotine  dependence 10/06/2012   Anxiety state 10/05/2012   Bipolar disorder, unspecified (HCC) 10/05/2012   Slurred speech 10/04/2012   Left-sided weakness 10/04/2012   Hyperkalemia 10/04/2012   Acute CVA  (cerebrovascular accident) (HCC) 10/04/2012   Essential hypertension     PCP: Norval Kettle, MD  REFERRING PROVIDER: Jerona Sage, MD  ONSET DATE: 01/25/2023 prosthesis delivery  REFERRING DIAG: S10.387 (ICD-10-CM) - Hx of AKA (above knee amputation), left   THERAPY DIAG:  Other abnormalities of gait and mobility  Unsteadiness on feet  Muscle weakness (generalized)  Difficulty in walking, not elsewhere classified  S/P AKA (above knee amputation) unilateral, left (HCC)  Rationale for Evaluation and Treatment: Rehabilitation  SUBJECTIVE:   SUBJECTIVE STATEMENT: Patient reports having a fall since the last time she saw us - fell 1/29. Did hit a lot of parts on her body but per patient nothing is bruised or needs to be looked at. Was not wearing prosthesis when fall occurred. She did not have her prosthesis for a few days since hanger was doing modifications to prosthetic for comfort of wear.   Has not been able to wear prosthesis from longer than 2hrs due to increased pain  and soreness since fall. Has had increased fear of falling and is hesisitant in wearing leg more since she does not want to fall. Educated on being a higher fall risk when not wearing the prosthetic.  Pt accompanied by: [place], significant other  PERTINENT HISTORY: AKA, anemia, anxiety, arthritis, Bipolar disorder, Breast CA left with radiation, HTN, Hep C, liver cirrhosis, Neuromuscular disorder, osteopenia, stroke Left hemiparesis 2014  PAIN:  Are you having pain? Yes, no rate given Pain location: full body from fall 1/29 Pain description: soreness Aggravating factors: standing or walking too much, or twisting motion Relieving factors: resting  PRECAUTIONS: Fall  WEIGHT BEARING RESTRICTIONS: No  FALLS: Has patient fallen in last 6 months? Yes. Number of falls 1 no injuries  LIVING ENVIRONMENT: Lives with: lives with their spouse Lives in: Mobile home Home Access: Ramped entrance or 4 steps with double  rail to [place] layout: One level Has following equipment at home: Single point cane, Environmental Consultant - 2 wheeled, Wheelchair (manual), Shower bench, bed side commode, Grab bars, and Ramped entry  OCCUPATION: retired from nursing  PLOF: Independent prior to Fracture 04/10/2021.  Since fracture was able to get to cane for community until hardware broke.    PATIENT GOALS:  walk independently in community, travel, drive, take care of 4yo great granddaughter  OBJECTIVE:  COGNITION: Overall cognitive status: Within functional limits for tasks assessed  POSTURE: rounded shoulders, forward head, flexed trunk , and weight shift right  LOWER EXTREMITY ROM:  ROM P:passive  A:active Right eval Left eval  Hip flexion    Hip extension    Hip abduction    Hip adduction    Hip internal rotation    Hip external rotation    Knee flexion    Knee extension    Ankle dorsiflexion    Ankle plantarflexion    Ankle inversion    Ankle eversion     (Blank rows = not tested)  LOWER EXTREMITY MMT:  MMT Right eval Left eval  Hip flexion    Hip extension    Hip abduction    Hip adduction    Hip internal rotation    Hip external rotation    Knee flexion    Knee extension    Ankle dorsiflexion    Ankle plantarflexion    Ankle inversion    Ankle eversion    At Evaluation all strength testing is grossly seated and functionally standing / gait. (Blank rows = not tested)  TRANSFERS: Evaluation on 01/25/2023:  Sit to stand: SBA w/c to RW requires armrests on w/c and stabilization with RW.   Stand to sit: SBA RW to w/c requires armrests on w/c and stabilization with RW.    FUNCTIONAL TESTs:  03/02/2023: Lars Balance 22/56     Eval: Berg Balance Scale: 15/56   GAIT: 01/26/2023: Patient ambulated 10 feet with rolling walker with minA but required constant cueing for technique and safety.  CURRENT PROSTHETIC WEAR ASSESSMENT: Evaluation on 01/25/2023:  Patient is dependent with: skin check,  residual limb care, care of non-amputated limb, prosthetic cleaning, ply sock cleaning, correct ply sock adjustment, proper wear schedule/adjustment, and proper weight-bearing schedule/adjustment Donning prosthesis: Min A Doffing prosthesis: SBA Prosthetic wear tolerance: tolerated wear 1 hour prior to PT evaluation.  Prosthetic weight bearing tolerance: 5 minutes with BUE support on RW with limited wear on prosthesis.  Edema: pitting Prosthetic description: Ischial Containment socket with flexible inner socket, silicon liner with velcro strap suspension.   TODAY'S TREATMENT:  DATE:  03/16/2023: Prosthetic Training with Transfemoral Prosthesis: PT educated verbally on fall risk with & without prosthesis.  Pt verbalized understanding.   Ambulation with RW SBA 51ft before requiring seated break Ambulation with RW SBA 171ft weaving between obstacles and performing 2 U-turns; decreased cadence however proper positioning w/in walker  Ramp negotiation x2 up/down with CGA for safety; vc for proper LE and UE placement as well as walker distance   Performed 4 bouts of STS throughout session all from 18in chair without arms; vc for controlled descent and to find seat before lowering down; 1 instance of needing LLE to be physically placed in order for improved rise to stand.   TREATMENT:                                                                                                                             DATE:  03/04/2023: Prosthetic Training with Transfemoral Prosthesis: See HEP section. Demonstration, VC, TC, and visual cues used with patient able to replicate activity and verbalized understanding. Patient required VC for proper weight shift to prosthesis via hips instead of trunk, use of sink to hold on to for safety with activity, and maintaining appropriate feet distance with  proper stacked posture of hip over prosthesis. Verbal cues to begin to feel proprioception of LLE with socket - limb interface with HEP exercises.     TREATMENT:                                                                                                                             DATE:  03/02/2023: Prosthetic Training with Transfemoral Prosthesis: -Pt able to don/doff pants with minimal assist demonstrating weight shift and alternating UE support on walker. Required adjustment of strap to ensure snug fit of prosthesis.  -PT demo & verbal cues on how to pick up an object from floor keeping prosthetic knee extended for balance assistance and setup for safety to practice at home.  Patient return demo 2 reps with supervision using BUE support to abducted prosthesis & stabilize upon arising.  Patient verbalized understanding and returned demonstration with supervision for safety.  Her fiance verbalized how to provide supervision.  -Ambulation with RW 40ft with 180deg turn to R. VC for increased step through pattern especially on R. After bout:  99% O2 and 80 HR -Ambulation to elevator ~157ft with 90* turn R, pt able to push button for door without UE  assist on walker and turn to L before walking out of door with plenty of time before door shut. Continued VC for step through with R.   Physical Performance Testing: -See objective for Berg Balance  22/56    HOME EXERCISE PROGRAM: Do each exercise 1-2  times per day Do each exercise 5-10 repetitions Hold each exercise for 2 seconds to feel your location  AT SINK FIND YOUR MIDLINE POSITION AND PLACE FEET EQUAL DISTANCE FROM THE MIDLINE.  Try to find this position when standing still for activities.   USE TAPE ON FLOOR TO MARK THE MIDLINE POSITION which is even with middle of sink.  You also should try to feel with your limb pressure in socket.  You are trying to feel with limb what you used to feel with the bottom of your foot.  Side to Side  Shift: Moving your hips only (not shoulders): move weight onto your left leg, HOLD/FEEL pressure in socket.  Move back to equal weight on each leg, HOLD/FEEL pressure in socket. Move weight onto your right leg, HOLD/FEEL pressure in socket. Move back to equal weight on each leg, HOLD/FEEL pressure in socket. Repeat.  Start with both hands on sink, progress to hand on prosthetic side only, then no hands.  Front to Back Shift: Moving your hips only (not shoulders): move your weight forward onto your toes, HOLD/FEEL pressure in socket. Move your weight back to equal Flat Foot on both legs, HOLD/FEEL  pressure in socket. Move your weight back onto your heels, HOLD/FEEL  pressure in socket. Move your weight back to equal on both legs, HOLD/FEEL  pressure in socket. Repeat.  Start with both hands on sink, progress to hand on prosthetic side only, then no hands.  Moving Cones / Cups: With equal weight on each leg: Hold on with one hand the first time, then progress to no hand supports. Move cups from one side of sink to the other. Place cups ~2" out of your reach, progress to 10" beyond reach.  Place one hand in middle of sink and reach with other hand. Do both arms.  Then hover one hand and move cups with other hand.  Overhead/Upward Reaching: alternated reaching up to top cabinets or ceiling if no cabinets present. Keep equal weight on each leg. Start with one hand support on counter while other hand reaches and progress to no hand support with reaching.  ace one hand in middle of sink and reach with other hand. Do both arms.  Then hover one hand and move cups with other hand.  5.   Looking Over Shoulders: With equal weight on each leg: alternate turning to look over your shoulders with one hand support on counter as needed.  Start with head motions only to look in front of shoulder, then even with shoulder and progress to looking behind you. To look to side, move head /eyes, then shoulder on side looking pulls back,  shift more weight to side looking and pull hip back. Place one hand in middle of sink and let go with other hand so your shoulder can pull back. Switch hands to look other way.   Then hover one hand and look over shoulder. If looking right, use left hand at sink. If looking left, use right hand at sink. 6.  Stepping with leg that is not amputated:  Move items under cabinet out of your way. Shift your hips/pelvis so weight on prosthesis. Tighten muscles in hip on prosthetic side.  SLOWLY step other leg so front of foot is in cabinet. Then step back to floor.    ASSESSMENT:  CLINICAL IMPRESSION: Patient did well with increased ambulation despite pain from fall and increased fear of falling. Patient demonstrated STS with fairly good control however required vc for locating chair before sitting and required physical assist at end of session to position LLE appropriately before attempt at standing. Patient continues to make progress towards goals and will benefit from continued skilled physical therapy to address residual deficits.    OBJECTIVE IMPAIRMENTS: Abnormal gait, decreased activity tolerance, decreased balance, decreased endurance, decreased knowledge of condition, decreased knowledge of use of DME, decreased mobility, difficulty walking, decreased ROM, decreased strength, increased edema, impaired flexibility, postural dysfunction, prosthetic dependency , obesity, and pain.   ACTIVITY LIMITATIONS: carrying, lifting, bending, sitting, standing, stairs, transfers, and locomotion level  PARTICIPATION LIMITATIONS: meal prep, cleaning, and community activity  PERSONAL FACTORS: Fitness, Time since onset of injury/illness/exacerbation, and 3+ comorbidities: see PMH  are also affecting patient's functional outcome.   REHAB POTENTIAL: Good  CLINICAL DECISION MAKING: Evolving/moderate complexity  EVALUATION COMPLEXITY: Moderate   GOALS: Goals reviewed with patient? Yes  SHORT TERM GOALS: Target  date: 02/25/2023  Patient donnes prosthesis modified independent & verbalizes proper cleaning. Baseline: SEE OBJECTIVE DATA Goal status: MET 02/25/2023 2.  Patient tolerates prosthesis >10 hrs total /day without skin issues or limb pain after standing. Baseline: SEE OBJECTIVE DATA Goal status: MET 02/25/2023  3.  Berg Balance >20/56 Baseline: SEE OBJECTIVE DATA Goal status: MET 03/02/2023  4. Patient ambulates 62' with RW & prosthesis with supervision. Baseline: SEE OBJECTIVE DATA Goal status: Ongoing 03/09/2023  5. Patient negotiates ramps & curbs with RW & prosthesis with minA. Baseline: SEE OBJECTIVE DATA Goal status: Ongoing  03/09/2023  LONG TERM GOALS: Target date: 04/22/2023  Patient demonstrates & verbalized understanding of prosthetic care to enable safe utilization of prosthesis. Baseline: SEE OBJECTIVE DATA Goal status: Ongoing    03/09/2023  Patient tolerates prosthesis wear >90% of awake hours without skin or residual limb pain issues. Baseline: SEE OBJECTIVE DATA Goal status: Ongoing    03/09/2023  Berg Balance >35/56 to indicate lower fall risk Baseline: SEE OBJECTIVE DATA Goal status: Ongoing  03/09/2023  Patient ambulates >300' with prosthesis and cane or less modified independent. Baseline: SEE OBJECTIVE DATA Goal status: Ongoing   03/09/2023  Patient negotiates ramps, curbs & stairs with single rail with prosthesis  and cane or less modified independent. Baseline: SEE OBJECTIVE DATA Goal status: Ongoing   03/09/2023   Patient reports right knee pain </= 2/10 with standing and gait activities.   Baseline: SEE OBJECTIVE DATA Goal status:   Ongoing  03/09/2023  PLAN:  PT FREQUENCY:  3 x for 1 week, then 2x/week  PT DURATION: 90 days / 13 weeks  PLANNED INTERVENTIONS: 97164- PT Re-evaluation, 97110-Therapeutic exercises, 97530- Therapeutic activity, 97112- Neuromuscular re-education, [phone]- Self Care, [id number]- Gait training, [phone]- Prosthetic training,  Patient/Family education, Balance training, Stair training, DME instructions, and physical performance testing  PLAN FOR NEXT SESSION:  Do 10th visit progress note,  Check remaining STGs.  Balance activities. Continue gait training with RW.  Instruct in stairs.    Chianne Byrns, [place] Francisco, Student-PT 03/16/2023, 2:12 PM  This entire session of physical therapy was performed under the direct supervision of PT signing evaluation /treatment. PT reviewed note and agrees.   Robin Waldron, PT, DPT 03/16/2023, 4:27 PM

## 2023-03-18 ENCOUNTER — Ambulatory Visit: Payer: Medicare Other | Admitting: Physical Therapy

## 2023-03-18 ENCOUNTER — Encounter: Payer: Self-pay | Admitting: Physical Therapy

## 2023-03-18 DIAGNOSIS — R2689 Other abnormalities of gait and mobility: Secondary | ICD-10-CM

## 2023-03-18 DIAGNOSIS — R2681 Unsteadiness on feet: Secondary | ICD-10-CM

## 2023-03-18 DIAGNOSIS — Z89612 Acquired absence of left leg above knee: Secondary | ICD-10-CM

## 2023-03-18 DIAGNOSIS — M6281 Muscle weakness (generalized): Secondary | ICD-10-CM

## 2023-03-18 DIAGNOSIS — R262 Difficulty in walking, not elsewhere classified: Secondary | ICD-10-CM | POA: Diagnosis not present

## 2023-03-18 NOTE — Therapy (Addendum)
 OUTPATIENT PHYSICAL THERAPY PROSTHETIC TREATMENT   Patient Name: Tammy [name] MRN: [id number] DOB:February 04, 1956, 68 y.o., female Today's Date: 03/18/2023 Progress Note Reporting Period 01/25/2023 to 03/18/2023  See note below for Objective Data and Assessment of Progress/Goals.     END OF SESSION:  PT End of Session - 03/18/23 1220     Visit Number 10    Number of Visits 27    Date for PT Re-Evaluation 04/22/23    Authorization Type [employer]    Authorization Time Period $20 COPAY 16 PT VISITS APPROVED 12/16-2/11/2023    Authorization - Visit Number 10    Authorization - Number of Visits 16    Progress Note Due on Visit 10    PT Start Time 1112    PT Stop Time 1145    PT Time Calculation (min) 33 min    Equipment Utilized During Treatment Gait belt    Activity Tolerance Patient tolerated treatment well    Behavior During Therapy WFL for tasks assessed/performed               Past Medical History:  Diagnosis Date   Anemia    hx of   Anxiety    on meds   Arthritis    back/bilateral ankles   Bipolar disorder (HCC)    Breast cancer (HCC) 2022   LEFT breast-radiation   Depression    on meds   Family history of breast cancer 07/10/2020   Family history of colon cancer 07/10/2020   Family history of ovarian cancer 07/10/2020   GERD (gastroesophageal reflux disease)    on meds   Heart murmur    dx by PCP   Hepatitis C    HTN (hypertension)    on meds   Hx of radiation therapy    Liver cirrhosis (HCC)    from hep c   Neuromuscular disorder (HCC)    Osteopenia    Personal history of radiation therapy    Stroke (HCC) 09/2012   slurred speech and LEFT sided weakness/notes 10/04/2012   Past Surgical History:  Procedure Laterality Date   AMPUTATION Left 11/04/2022   Procedure: LEFT ABOVE KNEE AMPUTATION;  Surgeon: Harden Jerona GAILS, MD;  Location: [facility] OR;  Service: Orthopedics;  Laterality: Left;   ANKLE FRACTURE SURGERY Left 2018   APPLICATION OF  WOUND VAC Left 06/15/2022   Procedure: APPLICATION OF WOUND VAC;  Surgeon: Lowery Estefana RAMAN, DO;  Location: [facility];  Service: Plastics;  Laterality: Left;   APPLICATION OF WOUND VAC Left 09/14/2022   Procedure: APPLICATION OF WOUND VAC;  Surgeon: Lowery Estefana RAMAN, DO;  Location: [facility] OR;  Service: Plastics;  Laterality: Left;   BREAST LUMPECTOMY Left 07/2020   BREAST LUMPECTOMY WITH RADIOACTIVE SEED AND SENTINEL LYMPH NODE BIOPSY Left 07/26/2020   Procedure: LEFT BREAST LUMPECTOMY WITH RADIOACTIVE SEED AND SENTINEL LYMPH NODE BIOPSY;  Surgeon: Vernetta Berg, MD;  Location: [facility];  Service: General;  Laterality: Left;   CESAREAN SECTION  1986   COLONOSCOPY     DIAGNOSTIC LAPAROSCOPY  1985   ESOPHAGOGASTRODUODENOSCOPY N/A 02/21/2023   Procedure: ESOPHAGOGASTRODUODENOSCOPY (EGD);  Surgeon: Abran Norleen SAILOR, MD;  Location: [facility];  Service: Gastroenterology;  Laterality: N/A;   HARDWARE REMOVAL Left 04/30/2022   Procedure: HARDWARE REMOVAL LEFT FEMUR;  Surgeon: Fidel Rogue, MD;  Location: [facility];  Service: Orthopedics;  Laterality: Left;  150   HARDWARE REMOVAL Left 11/04/2022   Procedure: HARDWARE REMOVAL INFECTED LEFT TOTAL KNEE ARTHROPLASTY;  Surgeon:  Harden Jerona GAILS, MD;  Location: [facility] OR;  Service: Orthopedics;  Laterality: Left;   I & D KNEE WITH POLY EXCHANGE Left 06/07/2022   Procedure: IRRIGATION AND DEBRIDEMENT KNEE WITH POLY EXCHANGE;  Surgeon: Fidel Rogue, MD;  Location: [facility];  Service: Orthopedics;  Laterality: Left;   INCISION AND DRAINAGE OF WOUND Left 06/04/2022   Procedure: IRRIGATION AND DEBRIDEMENT KNEE;  Surgeon: Fidel Rogue, MD;  Location: [facility];  Service: Orthopedics;  Laterality: Left;  60   INCISION AND DRAINAGE OF WOUND Left 06/15/2022   Procedure: excision of knee wound with Myriad;  Surgeon: Lowery Estefana RAMAN, DO;  Location: [facility];  Service: Plastics;  Laterality: Left;   ORIF FEMUR FRACTURE Left 04/10/2021   Procedure:  OPEN REDUCTION INTERNAL FIXATION (ORIF) DISTAL FEMUR FRACTURE;  Surgeon: Celena Sharper, MD;  Location: [facility] OR;  Service: Orthopedics;  Laterality: Left;   PARTIAL KNEE ARTHROPLASTY Left 04/30/2022   Procedure: DISTAL FEMUR REPLACEMENT;  Surgeon: Fidel Rogue, MD;  Location: [facility];  Service: Orthopedics;  Laterality: Left;  150   Patient Active Problem List   Diagnosis Date Noted   Left femoral vein DVT (HCC) 02/20/2023   GERD (gastroesophageal reflux disease) 02/20/2023   Acute upper gastrointestinal bleeding 02/19/2023   Bipolar 1 disorder, depressed, partial remission (HCC) 11/18/2022   Left above-knee amputee (HCC) 11/13/2022   Infection of total knee replacement (HCC) 11/04/2022   S/P AKA (above knee amputation) unilateral, left (HCC) 11/04/2022   Chronic hepatitis C with cirrhosis (HCC) 06/06/2022   Surgical wound dehiscence, initial encounter 06/04/2022   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, left, with nonunion, subsequent encounter 04/30/2022   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 04/30/2022   Hypokalemia 04/16/2021   Hip fracture requiring operative repair (HCC) 04/16/2021   Vitamin D  deficiency 04/12/2021   Hyponatremia 04/11/2021   Acute postoperative anemia due to expected blood loss 04/11/2021   Class 1 obesity 04/11/2021   Left comminuted and displaced fracture of the distal femur metaphysis 04/08/2021   History of CVA (cerebrovascular accident) 04/08/2021   Genetic testing 07/17/2020   Family history of breast cancer 07/10/2020   Family history of ovarian cancer 07/10/2020   Family history of colon cancer 07/10/2020   Malignant neoplasm of upper-outer quadrant of left breast in female, estrogen receptor positive (HCC) 07/09/2020   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   Chronic pain syndrome 10/06/2012   Nicotine  dependence 10/06/2012   Anxiety state 10/05/2012   Bipolar disorder,  unspecified (HCC) 10/05/2012   Slurred speech 10/04/2012   Left-sided weakness 10/04/2012   Hyperkalemia 10/04/2012   Acute CVA (cerebrovascular accident) (HCC) 10/04/2012   Essential hypertension     PCP: Norval Kettle, MD  REFERRING PROVIDER: Jerona Harden, MD  ONSET DATE: 01/25/2023 prosthesis delivery  REFERRING DIAG: S10.387 (ICD-10-CM) - Hx of AKA (above knee amputation), left   THERAPY DIAG:  Other abnormalities of gait and mobility  Unsteadiness on feet  Muscle weakness (generalized)  S/P AKA (above knee amputation) unilateral, left (HCC)  Difficulty in walking, not elsewhere classified  Rationale for Evaluation and Treatment: Rehabilitation  SUBJECTIVE:   SUBJECTIVE STATEMENT: Feels better since last time but is still sore in some areas. Has been wearing the prosthesis in 2hr increments with 1hr off, cycling throughout the day until bed.   Pt accompanied by: [place], significant other  PERTINENT HISTORY: AKA, anemia, anxiety, arthritis, Bipolar disorder, Breast CA left with radiation, HTN, Hep C, liver cirrhosis, Neuromuscular disorder,  osteopenia, stroke Left hemiparesis 2014  PAIN:  Are you having pain? Yes, no rate given Pain location: full body from fall 1/29 Pain description: soreness Aggravating factors: standing or walking too much, or twisting motion Relieving factors: resting  PRECAUTIONS: Fall  WEIGHT BEARING RESTRICTIONS: No  FALLS: Has patient fallen in last 6 months? Yes. Number of falls 1 no injuries  LIVING ENVIRONMENT: Lives with: lives with their spouse Lives in: Mobile home Home Access: Ramped entrance or 4 steps with double rail to [place] layout: One level Has following equipment at home: Single point cane, Environmental Consultant - 2 wheeled, Wheelchair (manual), Shower bench, bed side commode, Grab bars, and Ramped entry  OCCUPATION: retired from nursing  PLOF: Independent prior to Fracture 04/10/2021.  Since fracture was able to get to cane for  community until hardware broke.    PATIENT GOALS:  walk independently in community, travel, drive, take care of 4yo great granddaughter  OBJECTIVE:  COGNITION: Overall cognitive status: Within functional limits for tasks assessed  POSTURE: rounded shoulders, forward head, flexed trunk , and weight shift right  LOWER EXTREMITY ROM:  ROM P:passive  A:active Right eval Left eval  Hip flexion    Hip extension    Hip abduction    Hip adduction    Hip internal rotation    Hip external rotation    Knee flexion    Knee extension    Ankle dorsiflexion    Ankle plantarflexion    Ankle inversion    Ankle eversion     (Blank rows = not tested)  LOWER EXTREMITY MMT:  MMT Right eval Left eval  Hip flexion    Hip extension    Hip abduction    Hip adduction    Hip internal rotation    Hip external rotation    Knee flexion    Knee extension    Ankle dorsiflexion    Ankle plantarflexion    Ankle inversion    Ankle eversion    At Evaluation all strength testing is grossly seated and functionally standing / gait. (Blank rows = not tested)  TRANSFERS: Evaluation on 01/25/2023:  Sit to stand: SBA w/c to RW requires armrests on w/c and stabilization with RW.   Stand to sit: SBA RW to w/c requires armrests on w/c and stabilization with RW.    FUNCTIONAL TESTs:  03/02/2023: Lars Balance 22/56  Eval: Berg Balance Scale: 15/56   GAIT: 03/18/2023: -Patient ambulated 149ft and performing a 180deg turn CGA for patient safety with intermittent SBA  01/26/2023: Patient ambulated 10 feet with rolling walker with minA but required constant cueing for technique and safety.  PALPATION: 03/18/2023: patient verbalized seeing areas of discoloration on residual limb a few days prior. Therapist palpation and examination of area, did not see any noticeable areas with patient stating that the areas must have gone away without issue.    CURRENT PROSTHETIC WEAR ASSESSMENT: Evaluation on  01/25/2023:  Patient is dependent with: skin check, residual limb care, care of non-amputated limb, prosthetic cleaning, ply sock cleaning, correct ply sock adjustment, proper wear schedule/adjustment, and proper weight-bearing schedule/adjustment Donning prosthesis: Min A Doffing prosthesis: SBA Prosthetic wear tolerance: tolerated wear 1 hour prior to PT evaluation.  Prosthetic weight bearing tolerance: 5 minutes with BUE support on RW with limited wear on prosthesis.  Edema: pitting Prosthetic description: Ischial Containment socket with flexible inner socket, silicon liner with velcro strap suspension.   TODAY'S TODAY'S TREATMENT:  DATE:  03/18/2023: Prosthetic Training with Transfemoral Prosthesis: -Patient demonstrated independence with doffing prosthesis and set up assist to don prosthesis due to the thickness of the strap trying to fit into the socket hole.  -Patient ambulated 162ft and performing a 180deg turn CGA for patient safety with intermittent SBA -Patient educated on products to reduce sweat within liner along with key points for recognition of when she is sweating in the liner. Patient and significant other verbalizing understanding. -Patient significant other educated on proper and safe wc follow when patient is ambulating with RW and use of gait belt for safety. Significant other verbalizing understanding.    TREATMENT:                                                                                                                             DATE:  03/16/2023: Prosthetic Training with Transfemoral Prosthesis: PT educated verbally on fall risk with & without prosthesis.  Pt verbalized understanding.   Ambulation with RW SBA 3ft before requiring seated break Ambulation with RW SBA 169ft weaving between obstacles and performing 2 U-turns; decreased cadence  however proper positioning w/in walker  Ramp negotiation x2 up/down with CGA for safety; vc for proper LE and UE placement as well as walker distance   Performed 4 bouts of STS throughout session all from 18in chair without arms; vc for controlled descent and to find seat before lowering down; 1 instance of needing LLE to be physically placed in order for improved rise to stand.   TREATMENT:                                                                                                                             DATE:  03/04/2023: Prosthetic Training with Transfemoral Prosthesis: See HEP section. Demonstration, VC, TC, and visual cues used with patient able to replicate activity and verbalized understanding. Patient required VC for proper weight shift to prosthesis via hips instead of trunk, use of sink to hold on to for safety with activity, and maintaining appropriate feet distance with proper stacked posture of hip over prosthesis. Verbal cues to begin to feel proprioception of LLE with socket - limb interface with HEP exercises.     TREATMENT:  DATE:  03/02/2023: Prosthetic Training with Transfemoral Prosthesis: -Pt able to don/doff pants with minimal assist demonstrating weight shift and alternating UE support on walker. Required adjustment of strap to ensure snug fit of prosthesis.  -PT demo & verbal cues on how to pick up an object from floor keeping prosthetic knee extended for balance assistance and setup for safety to practice at home.  Patient return demo 2 reps with supervision using BUE support to abducted prosthesis & stabilize upon arising.  Patient verbalized understanding and returned demonstration with supervision for safety.  Her fiance verbalized how to provide supervision.  -Ambulation with RW 11ft with 180deg turn to R. VC for increased step through  pattern especially on R. After bout:  99% O2 and 80 HR -Ambulation to elevator ~133ft with 90* turn R, pt able to push button for door without UE assist on walker and turn to L before walking out of door with plenty of time before door shut. Continued VC for step through with R.   Physical Performance Testing: -See objective for Berg Balance  22/56    HOME EXERCISE PROGRAM: Do each exercise 1-2  times per day Do each exercise 5-10 repetitions Hold each exercise for 2 seconds to feel your location  AT SINK FIND YOUR MIDLINE POSITION AND PLACE FEET EQUAL DISTANCE FROM THE MIDLINE.  Try to find this position when standing still for activities.   USE TAPE ON FLOOR TO MARK THE MIDLINE POSITION which is even with middle of sink.  You also should try to feel with your limb pressure in socket.  You are trying to feel with limb what you used to feel with the bottom of your foot.  Side to Side Shift: Moving your hips only (not shoulders): move weight onto your left leg, HOLD/FEEL pressure in socket.  Move back to equal weight on each leg, HOLD/FEEL pressure in socket. Move weight onto your right leg, HOLD/FEEL pressure in socket. Move back to equal weight on each leg, HOLD/FEEL pressure in socket. Repeat.  Start with both hands on sink, progress to hand on prosthetic side only, then no hands.  Front to Back Shift: Moving your hips only (not shoulders): move your weight forward onto your toes, HOLD/FEEL pressure in socket. Move your weight back to equal Flat Foot on both legs, HOLD/FEEL  pressure in socket. Move your weight back onto your heels, HOLD/FEEL  pressure in socket. Move your weight back to equal on both legs, HOLD/FEEL  pressure in socket. Repeat.  Start with both hands on sink, progress to hand on prosthetic side only, then no hands.  Moving Cones / Cups: With equal weight on each leg: Hold on with one hand the first time, then progress to no hand supports. Move cups from one side of sink to the  other. Place cups ~2" out of your reach, progress to 10" beyond reach.  Place one hand in middle of sink and reach with other hand. Do both arms.  Then hover one hand and move cups with other hand.  Overhead/Upward Reaching: alternated reaching up to top cabinets or ceiling if no cabinets present. Keep equal weight on each leg. Start with one hand support on counter while other hand reaches and progress to no hand support with reaching.  ace one hand in middle of sink and reach with other hand. Do both arms.  Then hover one hand and move cups with other hand.  5.   Looking Over Shoulders: With equal weight on each  leg: alternate turning to look over your shoulders with one hand support on counter as needed.  Start with head motions only to look in front of shoulder, then even with shoulder and progress to looking behind you. To look to side, move head /eyes, then shoulder on side looking pulls back, shift more weight to side looking and pull hip back. Place one hand in middle of sink and let go with other hand so your shoulder can pull back. Switch hands to look other way.   Then hover one hand and look over shoulder. If looking right, use left hand at sink. If looking left, use right hand at sink. 6.  Stepping with leg that is not amputated:  Move items under cabinet out of your way. Shift your hips/pelvis so weight on prosthesis. Tighten muscles in hip on prosthetic side.  SLOWLY step other leg so front of foot is in cabinet. Then step back to floor.    ASSESSMENT:  CLINICAL IMPRESSION: Patient made progress towards independence with ambulation as she required CGA with instances of close SBA while ambulating within clinic 14ft. Patient continues to maintain motivation and continues to work towards goals and independence. To address deficits in strength, balance, and ambulation, patient will benefit from continued skilled physical therapy.   OBJECTIVE IMPAIRMENTS: Abnormal gait, decreased activity  tolerance, decreased balance, decreased endurance, decreased knowledge of condition, decreased knowledge of use of DME, decreased mobility, difficulty walking, decreased ROM, decreased strength, increased edema, impaired flexibility, postural dysfunction, prosthetic dependency , obesity, and pain.   ACTIVITY LIMITATIONS: carrying, lifting, bending, sitting, standing, stairs, transfers, and locomotion level  PARTICIPATION LIMITATIONS: meal prep, cleaning, and community activity  PERSONAL FACTORS: Fitness, Time since onset of injury/illness/exacerbation, and 3+ comorbidities: see PMH  are also affecting patient's functional outcome.   REHAB POTENTIAL: Good  CLINICAL DECISION MAKING: Evolving/moderate complexity  EVALUATION COMPLEXITY: Moderate   GOALS: Goals reviewed with patient? Yes  SHORT TERM GOALS: Target date: 02/25/2023  Patient donnes prosthesis modified independent & verbalizes proper cleaning. Baseline: SEE OBJECTIVE DATA Goal status: MET 02/25/2023 2.  Patient tolerates prosthesis >10 hrs total /day without skin issues or limb pain after standing. Baseline: SEE OBJECTIVE DATA Goal status: MET 02/25/2023  3.  Berg Balance >20/56 Baseline: SEE OBJECTIVE DATA Goal status: MET 03/02/2023  4. Patient ambulates 58' with RW & prosthesis with supervision. Baseline: SEE OBJECTIVE DATA Goal status: Ongoing 03/18/2023  5. Patient negotiates ramps & curbs with RW & prosthesis with minA. Baseline: SEE OBJECTIVE DATA Goal status: Ongoing  03/18/2023  LONG TERM GOALS: Target date: 04/22/2023  Patient demonstrates & verbalized understanding of prosthetic care to enable safe utilization of prosthesis. Baseline: SEE OBJECTIVE DATA Goal status: Ongoing    03/09/2023  Patient tolerates prosthesis wear >90% of awake hours without skin or residual limb pain issues. Baseline: SEE OBJECTIVE DATA Goal status: Ongoing    03/09/2023  Berg Balance >35/56 to indicate lower fall risk Baseline: SEE  OBJECTIVE DATA Goal status: Ongoing  03/09/2023  Patient ambulates >300' with prosthesis and cane or less modified independent. Baseline: SEE OBJECTIVE DATA Goal status: Ongoing   03/09/2023  Patient negotiates ramps, curbs & stairs with single rail with prosthesis  and cane or less modified independent. Baseline: SEE OBJECTIVE DATA Goal status: Ongoing   03/09/2023   Patient reports right knee pain </= 2/10 with standing and gait activities.   Baseline: SEE OBJECTIVE DATA Goal status:   Ongoing  03/09/2023  PLAN:  PT FREQUENCY:  3 x  for 1 week, then 2x/week  PT DURATION: 90 days / 13 weeks  PLANNED INTERVENTIONS: 97164- PT Re-evaluation, 97110-Therapeutic exercises, 97530- Therapeutic activity, [id number]- Neuromuscular re-education, [phone]- Self Care, [id number]- Gait training, [phone]- Prosthetic training, Patient/Family education, Balance training, Stair training, DME instructions, and physical performance testing  PLAN FOR NEXT SESSION:  continue with balance activities. Continue gait training with RW and introduce stair negotiation   Juwon Scripter, Hayzlee Mcsorley, Student-PT 03/18/2023, 12:27 PM  This entire session of physical therapy was performed under the direct supervision of PT signing evaluation /treatment. PT reviewed note and agrees.   Grayce Spatz, PT, DPT 03/18/2023, 1:08 PM

## 2023-03-23 ENCOUNTER — Encounter: Payer: Self-pay | Admitting: Physical Therapy

## 2023-03-23 ENCOUNTER — Ambulatory Visit: Payer: Medicare Other | Admitting: Internal Medicine

## 2023-03-23 ENCOUNTER — Ambulatory Visit (INDEPENDENT_AMBULATORY_CARE_PROVIDER_SITE_OTHER): Payer: Medicare Other | Admitting: Physical Therapy

## 2023-03-23 ENCOUNTER — Other Ambulatory Visit: Payer: Self-pay | Admitting: Hematology

## 2023-03-23 DIAGNOSIS — Z89612 Acquired absence of left leg above knee: Secondary | ICD-10-CM | POA: Diagnosis not present

## 2023-03-23 DIAGNOSIS — M6281 Muscle weakness (generalized): Secondary | ICD-10-CM | POA: Diagnosis not present

## 2023-03-23 DIAGNOSIS — R262 Difficulty in walking, not elsewhere classified: Secondary | ICD-10-CM

## 2023-03-23 DIAGNOSIS — R2681 Unsteadiness on feet: Secondary | ICD-10-CM

## 2023-03-23 DIAGNOSIS — R2689 Other abnormalities of gait and mobility: Secondary | ICD-10-CM | POA: Diagnosis not present

## 2023-03-23 NOTE — Therapy (Addendum)
OUTPATIENT PHYSICAL THERAPY PROSTHETIC TREATMENT   Patient Name: Tammy [name] MRN: [id number] DOB:01-30-1956, 68 y.o., female Today's Date: 03/23/2023    END OF SESSION:  PT End of Session - 03/23/23 1114     Visit Number 11    Number of Visits 27    Date for PT Re-Evaluation 04/22/23    Authorization Type [employer]    Authorization Time Period $20 COPAY 16 PT VISITS APPROVED 12/16-2/11/2023    Authorization - Visit Number 11    Authorization - Number of Visits 16    Progress Note Due on Visit 20    PT Start Time 1112    PT Stop Time 1145    PT Time Calculation (min) 33 min    Equipment Utilized During Treatment Gait belt    Activity Tolerance Patient tolerated treatment well    Behavior During Therapy WFL for tasks assessed/performed             Past Medical History:  Diagnosis Date   Anemia    hx of   Anxiety    on meds   Arthritis    back/bilateral ankles   Bipolar disorder (HCC)    Breast cancer (HCC) 2022   LEFT breast-radiation   Depression    on meds   Family history of breast cancer 07/10/2020   Family history of colon cancer 07/10/2020   Family history of ovarian cancer 07/10/2020   GERD (gastroesophageal reflux disease)    on meds   Heart murmur    dx by PCP   Hepatitis C    HTN (hypertension)    on meds   Hx of radiation therapy    Liver cirrhosis (HCC)    from hep c   Neuromuscular disorder (HCC)    Osteopenia    Personal history of radiation therapy    Stroke (HCC) 09/2012   slurred speech and LEFT sided weakness/notes 10/04/2012   Past Surgical History:  Procedure Laterality Date   AMPUTATION Left 11/04/2022   Procedure: LEFT ABOVE KNEE AMPUTATION;  Surgeon: Nadara Mustard, MD;  Location: [facility] OR;  Service: Orthopedics;  Laterality: Left;   ANKLE FRACTURE SURGERY Left 2018   APPLICATION OF WOUND VAC Left 06/15/2022   Procedure: APPLICATION OF WOUND VAC;  Surgeon: Peggye Form, DO;  Location: [facility];  Service:  Plastics;  Laterality: Left;   APPLICATION OF WOUND VAC Left 09/14/2022   Procedure: APPLICATION OF WOUND VAC;  Surgeon: Peggye Form, DO;  Location: [facility];  Service: Plastics;  Laterality: Left;   BREAST LUMPECTOMY Left 07/2020   BREAST LUMPECTOMY WITH RADIOACTIVE SEED AND SENTINEL LYMPH NODE BIOPSY Left 07/26/2020   Procedure: LEFT BREAST LUMPECTOMY WITH RADIOACTIVE SEED AND SENTINEL LYMPH NODE BIOPSY;  Surgeon: Abigail Miyamoto, MD;  Location: [facility];  Service: General;  Laterality: Left;   CESAREAN SECTION  1986   COLONOSCOPY     DIAGNOSTIC LAPAROSCOPY  1985   ESOPHAGOGASTRODUODENOSCOPY N/A 02/21/2023   Procedure: ESOPHAGOGASTRODUODENOSCOPY (EGD);  Surgeon: Hilarie Fredrickson, MD;  Location: [facility];  Service: Gastroenterology;  Laterality: N/A;   HARDWARE REMOVAL Left 04/30/2022   Procedure: HARDWARE REMOVAL LEFT FEMUR;  Surgeon: Samson Frederic, MD;  Location: [facility];  Service: Orthopedics;  Laterality: Left;  150   HARDWARE REMOVAL Left 11/04/2022   Procedure: HARDWARE REMOVAL INFECTED LEFT TOTAL KNEE ARTHROPLASTY;  Surgeon: Nadara Mustard, MD;  Location: [facility];  Service: Orthopedics;  Laterality: Left;   I & D KNEE WITH  POLY EXCHANGE Left 06/07/2022   Procedure: IRRIGATION AND DEBRIDEMENT KNEE WITH POLY EXCHANGE;  Surgeon: Samson Frederic, MD;  Location: [facility];  Service: Orthopedics;  Laterality: Left;   INCISION AND DRAINAGE OF WOUND Left 06/04/2022   Procedure: IRRIGATION AND DEBRIDEMENT KNEE;  Surgeon: Samson Frederic, MD;  Location: [facility];  Service: Orthopedics;  Laterality: Left;  60   INCISION AND DRAINAGE OF WOUND Left 06/15/2022   Procedure: excision of knee wound with Myriad;  Surgeon: Peggye Form, DO;  Location: [facility];  Service: Plastics;  Laterality: Left;   ORIF FEMUR FRACTURE Left 04/10/2021   Procedure: OPEN REDUCTION INTERNAL FIXATION (ORIF) DISTAL FEMUR FRACTURE;  Surgeon: Myrene Galas, MD;  Location: [facility];  Service: Orthopedics;   Laterality: Left;   PARTIAL KNEE ARTHROPLASTY Left 04/30/2022   Procedure: DISTAL FEMUR REPLACEMENT;  Surgeon: Samson Frederic, MD;  Location: [facility];  Service: Orthopedics;  Laterality: Left;  150   Patient Active Problem List   Diagnosis Date Noted   Left femoral vein DVT (HCC) 02/20/2023   GERD (gastroesophageal reflux disease) 02/20/2023   Acute upper gastrointestinal bleeding 02/19/2023   Bipolar 1 disorder, depressed, partial remission (HCC) 11/18/2022   Left above-knee amputee (HCC) 11/13/2022   Infection of total knee replacement (HCC) 11/04/2022   S/P AKA (above knee amputation) unilateral, left (HCC) 11/04/2022   Chronic hepatitis C with cirrhosis (HCC) 06/06/2022   Surgical wound dehiscence, initial encounter 06/04/2022   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, left, with nonunion, subsequent encounter 04/30/2022   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 04/30/2022   Hypokalemia 04/16/2021   Hip fracture requiring operative repair (HCC) 04/16/2021   Vitamin D deficiency 04/12/2021   Hyponatremia 04/11/2021   Acute postoperative anemia due to expected blood loss 04/11/2021   Class 1 obesity 04/11/2021   Left comminuted and displaced fracture of the distal femur metaphysis 04/08/2021   History of CVA (cerebrovascular accident) 04/08/2021   Genetic testing 07/17/2020   Family history of breast cancer 07/10/2020   Family history of ovarian cancer 07/10/2020   Family history of colon cancer 07/10/2020   Malignant neoplasm of upper-outer quadrant of left breast in female, estrogen receptor positive (HCC) 07/09/2020   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   Chronic pain syndrome 10/06/2012   Nicotine dependence 10/06/2012   Anxiety state 10/05/2012   Bipolar disorder, unspecified (HCC) 10/05/2012   Slurred speech 10/04/2012   Left-sided weakness 10/04/2012   Hyperkalemia 10/04/2012   Acute CVA  (cerebrovascular accident) (HCC) 10/04/2012   Essential hypertension     PCP: Quitman Livings, MD  REFERRING PROVIDER: Aldean Baker, MD  ONSET DATE: 01/25/2023 prosthesis delivery  REFERRING DIAG: Z61.096 (ICD-10-CM) - Hx of AKA (above knee amputation), left   THERAPY DIAG:  Other abnormalities of gait and mobility  Unsteadiness on feet  Muscle weakness (generalized)  S/P AKA (above knee amputation) unilateral, left (HCC)  Difficulty in walking, not elsewhere classified  Rationale for Evaluation and Treatment: Rehabilitation  SUBJECTIVE:   SUBJECTIVE STATEMENT: Patient was able to get in and out of her lower car today without issue. Is looking forward to attempting to drive. Pt accompanied by: [name], significant other  PERTINENT HISTORY: AKA, anemia, anxiety, arthritis, Bipolar disorder, Breast CA left with radiation, HTN, Hep C, liver cirrhosis, Neuromuscular disorder, osteopenia, stroke Left hemiparesis 2014  PAIN:  Are you having pain? Yes, 1/10 Pain location: residual limb Pain description: soreness Aggravating factors: standing or walking too much, or twisting  motion Relieving factors: resting  PRECAUTIONS: Fall  WEIGHT BEARING RESTRICTIONS: No  FALLS: Has patient fallen in last 6 months? Yes. Number of falls 1 no injuries  LIVING ENVIRONMENT: Lives with: lives with their spouse Lives in: Mobile home Home Access: Ramped entrance or 4 steps with double rail to [employer] layout: One level Has following equipment at home: Single point cane, Environmental consultant - 2 wheeled, Wheelchair (manual), Shower bench, bed side commode, Grab bars, and Ramped entry  OCCUPATION: retired from nursing  PLOF: Independent prior to Fracture 04/10/2021.  Since fracture was able to get to cane for community until hardware broke.    PATIENT GOALS:  walk independently in community, travel, drive, take care of 4yo great granddaughter  OBJECTIVE:  COGNITION: Overall cognitive status: Within  functional limits for tasks assessed  POSTURE: rounded shoulders, forward head, flexed trunk , and weight shift right  LOWER EXTREMITY ROM:  ROM P:passive  A:active Right eval Left eval  Hip flexion    Hip extension    Hip abduction    Hip adduction    Hip internal rotation    Hip external rotation    Knee flexion    Knee extension    Ankle dorsiflexion    Ankle plantarflexion    Ankle inversion    Ankle eversion     (Blank rows = not tested)  LOWER EXTREMITY MMT:  MMT Right eval Left eval  Hip flexion    Hip extension    Hip abduction    Hip adduction    Hip internal rotation    Hip external rotation    Knee flexion    Knee extension    Ankle dorsiflexion    Ankle plantarflexion    Ankle inversion    Ankle eversion    At Evaluation all strength testing is grossly seated and functionally standing / gait. (Blank rows = not tested)  TRANSFERS: Evaluation on 01/25/2023:  Sit to stand: SBA w/c to RW requires armrests on w/c and stabilization with RW.   Stand to sit: SBA RW to w/c requires armrests on w/c and stabilization with RW.    FUNCTIONAL TESTs:  03/02/2023: Sharlene Motts Balance 22/56  Eval: Berg Balance Scale: 15/56   GAIT: 03/18/2023: -Patient ambulated 11ft and performing a 180deg turn CGA for patient safety with intermittent SBA  01/26/2023: Patient ambulated 10 feet with rolling walker with minA but required constant cueing for technique and safety.  PALPATION: 03/18/2023: patient verbalized seeing areas of discoloration on residual limb a few days prior. Therapist palpation and examination of area, did not see any noticeable areas with patient stating that the areas must have gone away without issue.    CURRENT PROSTHETIC WEAR ASSESSMENT: Evaluation on 01/25/2023:  Patient is dependent with: skin check, residual limb care, care of non-amputated limb, prosthetic cleaning, ply sock cleaning, correct ply sock adjustment, proper wear schedule/adjustment, and  proper weight-bearing schedule/adjustment Donning prosthesis: Min A Doffing prosthesis: SBA Prosthetic wear tolerance: tolerated wear 1 hour prior to PT evaluation.  Prosthetic weight bearing tolerance: 5 minutes with BUE support on RW with limited wear on prosthesis.  Edema: pitting Prosthetic description: Ischial Containment socket with flexible inner socket, silicon liner with velcro strap suspension.   TODAY'S  TREATMENT:  DATE:  03/23/2023: Prosthetic Training with Transfemoral Prosthesis: -Patient ascended/descended 9 steps CGA with 2 instances of MinA due to fatigue. Patient negotiated use of BUE on bilateral railings for 4 steps and via side stepping BUEs on L railing for 5 step. Increased fatigue with patient requiring heavy vc for proper sequencing of LE during ascent. Required TC to ensure leg was fully locked out before fully accepting/transferring weight. SPT demo & verbal cues on technique.  -pt amb 50' X 2 with RW with minA/CGA with cues for upright posture and weight shift over prosthesis in stance.    Neuro: Standing balance with green TB UE flexion unilateral with RW support then BUEs with minA: 10 reps ea single UE and BUEs rows, forward reach and biceps curl/upward reach. All with RW directly in front of patient and holding on when doing unilateral activity. CGA throughout with a few instances of MinA due to fatigue and increased resistance with punching activity.    TREATMENT:                                                                                                                             DATE:  03/18/2023: Prosthetic Training with Transfemoral Prosthesis: -Patient demonstrated independence with doffing prosthesis and set up assist to don prosthesis due to the thickness of the strap trying to fit into the socket hole.  -Patient ambulated 141ft  and performing a 180deg turn CGA for patient safety with intermittent SBA -Patient educated on products to reduce sweat within liner along with key points for recognition of when she is sweating in the liner. Patient and significant other verbalizing understanding. -Patient significant other educated on proper and safe wc follow when patient is ambulating with RW and use of gait belt for safety. Significant other verbalizing understanding.    TREATMENT:                                                                                                                             DATE:  03/16/2023: Prosthetic Training with Transfemoral Prosthesis: PT educated verbally on fall risk with & without prosthesis.  Pt verbalized understanding.   Ambulation with RW SBA 60ft before requiring seated break Ambulation with RW SBA 150ft weaving between obstacles and performing 2 U-turns; decreased cadence however proper positioning w/in walker  Ramp negotiation x2 up/down with CGA for safety; vc for proper LE and UE placement as well as walker  distance   Performed 4 bouts of STS throughout session all from 18in chair without arms; vc for controlled descent and to find seat before lowering down; 1 instance of needing LLE to be physically placed in order for improved rise to stand.   TREATMENT:                                                                                                                             DATE:  03/04/2023: Prosthetic Training with Transfemoral Prosthesis: See HEP section. Demonstration, VC, TC, and visual cues used with patient able to replicate activity and verbalized understanding. Patient required VC for proper weight shift to prosthesis via hips instead of trunk, use of sink to hold on to for safety with activity, and maintaining appropriate feet distance with proper stacked posture of hip over prosthesis. Verbal cues to begin to feel proprioception of LLE with socket - limb interface  with HEP exercises.     TREATMENT:                                                                                                                             DATE:  03/02/2023: Prosthetic Training with Transfemoral Prosthesis: -Pt able to don/doff pants with minimal assist demonstrating weight shift and alternating UE support on walker. Required adjustment of strap to ensure snug fit of prosthesis.  -PT demo & verbal cues on how to pick up an object from floor keeping prosthetic knee extended for balance assistance and setup for safety to practice at home.  Patient return demo 2 reps with supervision using BUE support to abducted prosthesis & stabilize upon arising.  Patient verbalized understanding and returned demonstration with supervision for safety.  Her fiance verbalized how to provide supervision.  -Ambulation with RW 58ft with 180deg turn to R. VC for increased step through pattern especially on R. After bout:  99% O2 and 80 HR -Ambulation to elevator ~168ft with 90* turn R, pt able to push button for door without UE assist on walker and turn to L before walking out of door with plenty of time before door shut. Continued VC for step through with R.   Physical Performance Testing: -See objective for Berg Balance  22/56    HOME EXERCISE PROGRAM: Do each exercise 1-2  times per day Do each exercise 5-10 repetitions Hold each exercise for 2 seconds to feel your location  AT [facility] FIND YOUR  MIDLINE POSITION AND PLACE FEET EQUAL DISTANCE FROM THE MIDLINE.  Try to find this position when standing still for activities.   USE TAPE ON FLOOR TO MARK THE MIDLINE POSITION which is even with middle of sink.  You also should try to feel with your limb pressure in socket.  You are trying to feel with limb what you used to feel with the bottom of your foot.  Side to Side Shift: Moving your hips only (not shoulders): move weight onto your left leg, HOLD/FEEL pressure in socket.  Move back to equal  weight on each leg, HOLD/FEEL pressure in socket. Move weight onto your right leg, HOLD/FEEL pressure in socket. Move back to equal weight on each leg, HOLD/FEEL pressure in socket. Repeat.  Start with both hands on sink, progress to hand on prosthetic side only, then no hands.  Front to Back Shift: Moving your hips only (not shoulders): move your weight forward onto your toes, HOLD/FEEL pressure in socket. Move your weight back to equal Flat Foot on both legs, HOLD/FEEL  pressure in socket. Move your weight back onto your heels, HOLD/FEEL  pressure in socket. Move your weight back to equal on both legs, HOLD/FEEL  pressure in socket. Repeat.  Start with both hands on sink, progress to hand on prosthetic side only, then no hands.  Moving Cones / Cups: With equal weight on each leg: Hold on with one hand the first time, then progress to no hand supports. Move cups from one side of sink to the other. Place cups ~2" out of your reach, progress to 10" beyond reach.  Place one hand in middle of sink and reach with other hand. Do both arms.  Then hover one hand and move cups with other hand.  Overhead/Upward Reaching: alternated reaching up to top cabinets or ceiling if no cabinets present. Keep equal weight on each leg. Start with one hand support on counter while other hand reaches and progress to no hand support with reaching.  ace one hand in middle of sink and reach with other hand. Do both arms.  Then hover one hand and move cups with other hand.  5.   Looking Over Shoulders: With equal weight on each leg: alternate turning to look over your shoulders with one hand support on counter as needed.  Start with head motions only to look in front of shoulder, then even with shoulder and progress to looking behind you. To look to side, move head /eyes, then shoulder on side looking pulls back, shift more weight to side looking and pull hip back. Place one hand in middle of sink and let go with other hand so your  shoulder can pull back. Switch hands to look other way.   Then hover one hand and look over shoulder. If looking right, use left hand at sink. If looking left, use right hand at sink. 6.  Stepping with leg that is not amputated:  Move items under cabinet out of your way. Shift your hips/pelvis so weight on prosthesis. Tighten muscles in hip on prosthetic side.  SLOWLY step other leg so front of foot is in cabinet. Then step back to floor.    ASSESSMENT:  CLINICAL IMPRESSION: Patient improved understanding of negotiating stairs with prosthesis.  She will require further work for safety and to instruct in using right side rail if needed.  Pt improved balance reactions with instruction and repetition with UE resistive activity.  Patient continues to demonstrate decreased strength and coordination and  will benefit from continued skilled physical therapy.  OBJECTIVE IMPAIRMENTS: Abnormal gait, decreased activity tolerance, decreased balance, decreased endurance, decreased knowledge of condition, decreased knowledge of use of DME, decreased mobility, difficulty walking, decreased ROM, decreased strength, increased edema, impaired flexibility, postural dysfunction, prosthetic dependency , obesity, and pain.   ACTIVITY LIMITATIONS: carrying, lifting, bending, sitting, standing, stairs, transfers, and locomotion level  PARTICIPATION LIMITATIONS: meal prep, cleaning, and community activity  PERSONAL FACTORS: Fitness, Time since onset of injury/illness/exacerbation, and 3+ comorbidities: see PMH  are also affecting patient's functional outcome.   REHAB POTENTIAL: Good  CLINICAL DECISION MAKING: Evolving/moderate complexity  EVALUATION COMPLEXITY: Moderate   GOALS: Goals reviewed with patient? Yes  SHORT TERM GOALS: Target date: 02/25/2023  Patient donnes prosthesis modified independent & verbalizes proper cleaning. Baseline: SEE OBJECTIVE DATA Goal status: MET 02/25/2023 2.  Patient tolerates  prosthesis >10 hrs total /day without skin issues or limb pain after standing. Baseline: SEE OBJECTIVE DATA Goal status: MET 02/25/2023  3.  Berg Balance >20/56 Baseline: SEE OBJECTIVE DATA Goal status: MET 03/02/2023  4. Patient ambulates 90' with RW & prosthesis with supervision. Baseline: SEE OBJECTIVE DATA Goal status: Ongoing 03/18/2023  5. Patient negotiates ramps & curbs with RW & prosthesis with minA. Baseline: SEE OBJECTIVE DATA Goal status: Ongoing  03/18/2023  LONG TERM GOALS: Target date: 04/22/2023  Patient demonstrates & verbalized understanding of prosthetic care to enable safe utilization of prosthesis. Baseline: SEE OBJECTIVE DATA Goal status: Ongoing   03/23/2023  Patient tolerates prosthesis wear >90% of awake hours without skin or residual limb pain issues. Baseline: SEE OBJECTIVE DATA Goal status: Ongoing   03/23/2023  Berg Balance >35/56 to indicate lower fall risk Baseline: SEE OBJECTIVE DATA Goal status: Ongoing   03/23/2023  Patient ambulates >300' with prosthesis and cane or less modified independent. Baseline: SEE OBJECTIVE DATA Goal status: Ongoing   03/23/2023  Patient negotiates ramps, curbs & stairs with single rail with prosthesis  and cane or less modified independent. Baseline: SEE OBJECTIVE DATA Goal status: Ongoing    03/23/2023   Patient reports right knee pain </= 2/10 with standing and gait activities.   Baseline: SEE OBJECTIVE DATA Goal status:   Ongoing    03/23/2023  PLAN:  PT FREQUENCY:  3 x for 1 week, then 2x/week  PT DURATION: 90 days / 13 weeks  PLANNED INTERVENTIONS: 97164- PT Re-evaluation, 97110-Therapeutic exercises, 97530- Therapeutic activity, 97112- Neuromuscular re-education, [id number]- Self Care, 57322- Gait training, [id number]- Prosthetic training, Patient/Family education, Balance training, Stair training, DME instructions, and physical performance testing  PLAN FOR NEXT SESSION:  continue with band resisted balance activities  and stair negotiation with L railing.  Instruct in 4 point pattern and begin gait training with crutches.   Yaniris Braddock, Jaggar Benko, Student-PT 03/18/2023, 12:27 PM  This entire session of physical therapy was performed under the direct supervision of PT signing evaluation /treatment. PT reviewed note and agrees.   Vladimir Faster, PT, DPT 03/23/2023, 4:04 PM    Date of referral: 12/22/2022 Referring provider: Aldean Baker, MD Referring diagnosis? H06.237 (ICD-10-CM) - Hx of AKA (above knee amputation), left  Treatment diagnosis? (if different than referring diagnosis)  Other abnormalities of gait and mobility  ICD-10-CM: R26.89    Unsteadiness on feet  ICD-10-CM: R26.81    Muscle weakness (generalized)  ICD-10-CM: M62.81    Chronic pain of right knee  M25.561 & G89.29   History of falling Z91.81   What was this (referring dx) caused by? Surgery (Type: AKA)   Ashby Dawes  of Condition: Initial Onset (within last 3 months) for prosthesis delivery              Laterality: Lt   Current Functional Measure Score: Other 03/02/2023: Sharlene Motts Balance 22/56  at eval Berg was 15/56   Objective measurements identify impairments when they are compared to normal values, the uninvolved extremity, and prior level of function.             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  She has improved wear & care with PT education.     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     Average pain intensity:             Last 24 hours: 1-2/10             Past week: 1-2/10   How often does the pt experience symptoms? Constantly   How much have the symptoms interfered with usual daily activities? Extremely   How has condition changed since care began at this facility? Better   In  general, how is the patients overall health? Very Good     BACK PAIN (STarT Back Screening Tool) No

## 2023-03-25 ENCOUNTER — Ambulatory Visit: Payer: Medicare Other | Admitting: Orthopedic Surgery

## 2023-03-25 ENCOUNTER — Encounter: Payer: Self-pay | Admitting: Physical Therapy

## 2023-03-25 ENCOUNTER — Encounter: Payer: Medicare Other | Admitting: Physical Medicine & Rehabilitation

## 2023-03-25 ENCOUNTER — Ambulatory Visit (INDEPENDENT_AMBULATORY_CARE_PROVIDER_SITE_OTHER): Payer: Medicare Other | Admitting: Physical Therapy

## 2023-03-25 ENCOUNTER — Encounter: Payer: Self-pay | Admitting: Orthopedic Surgery

## 2023-03-25 DIAGNOSIS — Z89612 Acquired absence of left leg above knee: Secondary | ICD-10-CM

## 2023-03-25 DIAGNOSIS — R2689 Other abnormalities of gait and mobility: Secondary | ICD-10-CM | POA: Diagnosis not present

## 2023-03-25 DIAGNOSIS — M6281 Muscle weakness (generalized): Secondary | ICD-10-CM | POA: Diagnosis not present

## 2023-03-25 DIAGNOSIS — R262 Difficulty in walking, not elsewhere classified: Secondary | ICD-10-CM | POA: Diagnosis not present

## 2023-03-25 DIAGNOSIS — R2681 Unsteadiness on feet: Secondary | ICD-10-CM

## 2023-03-25 NOTE — Therapy (Addendum)
OUTPATIENT PHYSICAL THERAPY PROSTHETIC TREATMENT   Patient Name: Tammy [name] MRN: [id number] DOB:[birth year], 68 y.o., female Today's Date: 03/25/2023    END OF SESSION:  PT End of Session - 03/25/23 1145     Visit Number 12    Number of Visits 27    Date for PT Re-Evaluation 04/22/23    Authorization Type [employer]    Authorization Time Period $20 COPAY 16 PT VISITS APPROVED 12/16-2/11/2023    Authorization - Visit Number 12    Authorization - Number of Visits 16    Progress Note Due on Visit 20    PT Start Time 1143    PT Stop Time 1230    PT Time Calculation (min) 47 min    Equipment Utilized During Treatment Gait belt    Activity Tolerance Patient tolerated treatment well    Behavior During Therapy WFL for tasks assessed/performed             Past Medical History:  Diagnosis Date   Anemia    hx of   Anxiety    on meds   Arthritis    back/bilateral ankles   Bipolar disorder (HCC)    Breast cancer (HCC) 2022   LEFT breast-radiation   Depression    on meds   Family history of breast cancer 07/10/2020   Family history of colon cancer 07/10/2020   Family history of ovarian cancer 07/10/2020   GERD (gastroesophageal reflux disease)    on meds   Heart murmur    dx by PCP   Hepatitis C    HTN (hypertension)    on meds   Hx of radiation therapy    Liver cirrhosis (HCC)    from hep c   Neuromuscular disorder (HCC)    Osteopenia    Personal history of radiation therapy    Stroke (HCC) 09/2012   slurred speech and LEFT sided weakness/notes 10/04/2012   Past Surgical History:  Procedure Laterality Date   AMPUTATION Left 11/04/2022   Procedure: LEFT ABOVE KNEE AMPUTATION;  Surgeon: Nadara Mustard, MD;  Location: [facility] OR;  Service: Orthopedics;  Laterality: Left;   ANKLE FRACTURE SURGERY Left 2018   APPLICATION OF WOUND VAC Left 06/15/2022   Procedure: APPLICATION OF WOUND VAC;  Surgeon: Peggye Form, DO;  Location: [facility];  Service:  Plastics;  Laterality: Left;   APPLICATION OF WOUND VAC Left 09/14/2022   Procedure: APPLICATION OF WOUND VAC;  Surgeon: Peggye Form, DO;  Location: [facility] OR;  Service: Plastics;  Laterality: Left;   BREAST LUMPECTOMY Left 07/2020   BREAST LUMPECTOMY WITH RADIOACTIVE SEED AND SENTINEL LYMPH NODE BIOPSY Left 07/26/2020   Procedure: LEFT BREAST LUMPECTOMY WITH RADIOACTIVE SEED AND SENTINEL LYMPH NODE BIOPSY;  Surgeon: Abigail Miyamoto, MD;  Location: [facility];  Service: General;  Laterality: Left;   CESAREAN SECTION  1986   COLONOSCOPY     DIAGNOSTIC LAPAROSCOPY  1985   ESOPHAGOGASTRODUODENOSCOPY N/A 02/21/2023   Procedure: ESOPHAGOGASTRODUODENOSCOPY (EGD);  Surgeon: Hilarie Fredrickson, MD;  Location: [facility];  Service: Gastroenterology;  Laterality: N/A;   HARDWARE REMOVAL Left 04/30/2022   Procedure: HARDWARE REMOVAL LEFT FEMUR;  Surgeon: Samson Frederic, MD;  Location: [facility];  Service: Orthopedics;  Laterality: Left;  150   HARDWARE REMOVAL Left 11/04/2022   Procedure: HARDWARE REMOVAL INFECTED LEFT TOTAL KNEE ARTHROPLASTY;  Surgeon: Nadara Mustard, MD;  Location: [facility] OR;  Service: Orthopedics;  Laterality: Left;   I & D KNEE WITH  POLY EXCHANGE Left 06/07/2022   Procedure: IRRIGATION AND DEBRIDEMENT KNEE WITH POLY EXCHANGE;  Surgeon: Samson Frederic, MD;  Location: [facility];  Service: Orthopedics;  Laterality: Left;   INCISION AND DRAINAGE OF WOUND Left 06/04/2022   Procedure: IRRIGATION AND DEBRIDEMENT KNEE;  Surgeon: Samson Frederic, MD;  Location: [facility];  Service: Orthopedics;  Laterality: Left;  60   INCISION AND DRAINAGE OF WOUND Left 06/15/2022   Procedure: excision of knee wound with Myriad;  Surgeon: Peggye Form, DO;  Location: [facility];  Service: Plastics;  Laterality: Left;   ORIF FEMUR FRACTURE Left 04/10/2021   Procedure: OPEN REDUCTION INTERNAL FIXATION (ORIF) DISTAL FEMUR FRACTURE;  Surgeon: Myrene Galas, MD;  Location: [facility];  Service: Orthopedics;   Laterality: Left;   PARTIAL KNEE ARTHROPLASTY Left 04/30/2022   Procedure: DISTAL FEMUR REPLACEMENT;  Surgeon: Samson Frederic, MD;  Location: [facility];  Service: Orthopedics;  Laterality: Left;  150   Patient Active Problem List   Diagnosis Date Noted   Left femoral vein DVT (HCC) 02/20/2023   GERD (gastroesophageal reflux disease) 02/20/2023   Acute upper gastrointestinal bleeding 02/19/2023   Bipolar 1 disorder, depressed, partial remission (HCC) 11/18/2022   Left above-knee amputee (HCC) 11/13/2022   Infection of total knee replacement (HCC) 11/04/2022   S/P AKA (above knee amputation) unilateral, left (HCC) 11/04/2022   Chronic hepatitis C with cirrhosis (HCC) 06/06/2022   Surgical wound dehiscence, initial encounter 06/04/2022   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, left, with nonunion, subsequent encounter 04/30/2022   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 04/30/2022   Hypokalemia 04/16/2021   Hip fracture requiring operative repair (HCC) 04/16/2021   Vitamin D deficiency 04/12/2021   Hyponatremia 04/11/2021   Acute postoperative anemia due to expected blood loss 04/11/2021   Class 1 obesity 04/11/2021   Left comminuted and displaced fracture of the distal femur metaphysis 04/08/2021   History of CVA (cerebrovascular accident) 04/08/2021   Genetic testing 07/17/2020   Family history of breast cancer 07/10/2020   Family history of ovarian cancer 07/10/2020   Family history of colon cancer 07/10/2020   Malignant neoplasm of upper-outer quadrant of left breast in female, estrogen receptor positive (HCC) 07/09/2020   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   Chronic pain syndrome 10/06/2012   Nicotine dependence 10/06/2012   Anxiety state 10/05/2012   Bipolar disorder, unspecified (HCC) 10/05/2012   Slurred speech 10/04/2012   Left-sided weakness 10/04/2012   Hyperkalemia 10/04/2012   Acute CVA  (cerebrovascular accident) (HCC) 10/04/2012   Essential hypertension     PCP: Quitman Livings, MD  REFERRING PROVIDER: Aldean Baker, MD  ONSET DATE: 01/25/2023 prosthesis delivery  REFERRING DIAG: G64.403 (ICD-10-CM) - Hx of AKA (above knee amputation), left   THERAPY DIAG:  Other abnormalities of gait and mobility  Unsteadiness on feet  Muscle weakness (generalized)  S/P AKA (above knee amputation) unilateral, left (HCC)  Difficulty in walking, not elsewhere classified  Rationale for Evaluation and Treatment: Rehabilitation  SUBJECTIVE:   SUBJECTIVE STATEMENT: Had visit with the doctor this morning with patient reporting that it went well. Denies any pain.   Pt accompanied by: [name], significant other  PERTINENT HISTORY: AKA, anemia, anxiety, arthritis, Bipolar disorder, Breast CA left with radiation, HTN, Hep C, liver cirrhosis, Neuromuscular disorder, osteopenia, stroke Left hemiparesis 2014  PAIN:  Are you having pain? Yes, 1/10 Pain location: residual limb Pain description: soreness Aggravating factors: standing or walking too much, or twisting motion Relieving factors:  resting  PRECAUTIONS: Fall  WEIGHT BEARING RESTRICTIONS: No  FALLS: Has patient fallen in last 6 months? Yes. Number of falls 1 no injuries  LIVING ENVIRONMENT: Lives with: lives with their spouse Lives in: Mobile home Home Access: Ramped entrance or 4 steps with double rail to [employer] layout: One level Has following equipment at home: Single point cane, Environmental consultant - 2 wheeled, Wheelchair (manual), Shower bench, bed side commode, Grab bars, and Ramped entry  OCCUPATION: retired from nursing  PLOF: Independent prior to Fracture 04/10/2021.  Since fracture was able to get to cane for community until hardware broke.    PATIENT GOALS:  walk independently in community, travel, drive, take care of 4yo great granddaughter  OBJECTIVE:  COGNITION: Overall cognitive status: Within functional limits  for tasks assessed  POSTURE: rounded shoulders, forward head, flexed trunk , and weight shift right  LOWER EXTREMITY ROM:  ROM P:passive  A:active Right eval Left eval  Hip flexion    Hip extension    Hip abduction    Hip adduction    Hip internal rotation    Hip external rotation    Knee flexion    Knee extension    Ankle dorsiflexion    Ankle plantarflexion    Ankle inversion    Ankle eversion     (Blank rows = not tested)  LOWER EXTREMITY MMT:  MMT Right eval Left eval  Hip flexion    Hip extension    Hip abduction    Hip adduction    Hip internal rotation    Hip external rotation    Knee flexion    Knee extension    Ankle dorsiflexion    Ankle plantarflexion    Ankle inversion    Ankle eversion    At Evaluation all strength testing is grossly seated and functionally standing / gait. (Blank rows = not tested)  TRANSFERS: Evaluation on 01/25/2023:  Sit to stand: SBA w/c to RW requires armrests on w/c and stabilization with RW.   Stand to sit: SBA RW to w/c requires armrests on w/c and stabilization with RW.    FUNCTIONAL TESTs:  03/02/2023: Sharlene Motts Balance 22/56  Eval: Berg Balance Scale: 15/56   GAIT: 03/18/2023: -Patient ambulated 187ft and performing a 180deg turn CGA for patient safety with intermittent SBA  01/26/2023: Patient ambulated 10 feet with rolling walker with minA but required constant cueing for technique and safety.  PALPATION: 03/18/2023: patient verbalized seeing areas of discoloration on residual limb a few days prior. Therapist palpation and examination of area, did not see any noticeable areas with patient stating that the areas must have gone away without issue.    CURRENT PROSTHETIC WEAR ASSESSMENT: Evaluation on 01/25/2023:  Patient is dependent with: skin check, residual limb care, care of non-amputated limb, prosthetic cleaning, ply sock cleaning, correct ply sock adjustment, proper wear schedule/adjustment, and proper  weight-bearing schedule/adjustment Donning prosthesis: Min A Doffing prosthesis: SBA Prosthetic wear tolerance: tolerated wear 1 hour prior to PT evaluation.  Prosthetic weight bearing tolerance: 5 minutes with BUE support on RW with limited wear on prosthesis.  Edema: pitting Prosthetic description: Ischial Containment socket with flexible inner socket, silicon liner with velcro strap suspension.   TODAY'S  TREATMENT:  DATE:  03/25/2023: Prosthetic Training with Transfemoral Prosthesis: PT educated pt on 4-point sequencing and pattern via visual aids to understand UE and LE placement when using forearm crutches. Patient ambulated with 4point gait patttern 2' but deemed unsafe today.  Due to the height of the crutches on hand, ambulation was terminated as it was unsafe and not favorable to the patient to progress ambulation.   Patient able to ambulate with RW 60ftx2 CGA and reciprocal gait pattern. However when turning to sit in 18in chair patient lost balance requiring maxA at gait belt from therapist to prevent fall. Patient did not lock knee out before shifting weight causing the knee to buckle under the patient as they were turning with the RW.    Curb and ramp negotiation with RW CGA increased to MinA as patient demonstrated unsteadiness with ascending ramp due to not locking knee before weight acceptance; vc for proper sequencing    STS with RW 18in chair with no armrest; CGA for patient safety. Patient able to reach for seat and unlock prosthetic leg to lower down under control without issue.  Neuro: Standing balance with green TB and RW support/RW in front of patient if patient were to need it:  UE punches at 90deg x10 ea unilateral with RW support then alternating BUE with patient demonstrating good weight shifting, 1 instance of MinA due to fatigue from punching.  CGA for safety; vc for proper foot placement  BUEs rows x10 ea unilateral/bilateral/alternating; CGA throughout with a few instances of patient requiring RW assist to regain balance.   TREATMENT:                                                                                                                             DATE:  03/23/2023: Prosthetic Training with Transfemoral Prosthesis: -Patient ascended/descended 9 steps CGA with 2 instances of MinA due to fatigue. Patient negotiated use of BUE on bilateral railings for 4 steps and via side stepping BUEs on L railing for 5 step. Increased fatigue with patient requiring heavy vc for proper sequencing of LE during ascent. Required TC to ensure leg was fully locked out before fully accepting/transferring weight. SPT demo & verbal cues on technique.  -pt amb 50' X 2 with RW with minA/CGA with cues for upright posture and weight shift over prosthesis in stance.    Neuro: Standing balance with green TB UE flexion unilateral with RW support then BUEs with minA: 10 reps ea single UE and BUEs rows, forward reach and biceps curl/upward reach. All with RW directly in front of patient and holding on when doing unilateral activity. CGA throughout with a few instances of MinA due to fatigue and increased resistance with punching activity.    TREATMENT:  DATE:  03/18/2023: Prosthetic Training with Transfemoral Prosthesis: -Patient demonstrated independence with doffing prosthesis and set up assist to don prosthesis due to the thickness of the strap trying to fit into the socket hole.  -Patient ambulated 131ft and performing a 180deg turn CGA for patient safety with intermittent SBA -Patient educated on products to reduce sweat within liner along with key points for recognition of when she is sweating in the liner. Patient and significant other  verbalizing understanding. -Patient significant other educated on proper and safe wc follow when patient is ambulating with RW and use of gait belt for safety. Significant other verbalizing understanding.   HOME EXERCISE PROGRAM: Do each exercise 1-2  times per day Do each exercise 5-10 repetitions Hold each exercise for 2 seconds to feel your location  AT SINK FIND YOUR MIDLINE POSITION AND PLACE FEET EQUAL DISTANCE FROM THE MIDLINE.  Try to find this position when standing still for activities.   USE TAPE ON FLOOR TO MARK THE MIDLINE POSITION which is even with middle of sink.  You also should try to feel with your limb pressure in socket.  You are trying to feel with limb what you used to feel with the bottom of your foot.  Side to Side Shift: Moving your hips only (not shoulders): move weight onto your left leg, HOLD/FEEL pressure in socket.  Move back to equal weight on each leg, HOLD/FEEL pressure in socket. Move weight onto your right leg, HOLD/FEEL pressure in socket. Move back to equal weight on each leg, HOLD/FEEL pressure in socket. Repeat.  Start with both hands on sink, progress to hand on prosthetic side only, then no hands.  Front to Back Shift: Moving your hips only (not shoulders): move your weight forward onto your toes, HOLD/FEEL pressure in socket. Move your weight back to equal Flat Foot on both legs, HOLD/FEEL  pressure in socket. Move your weight back onto your heels, HOLD/FEEL  pressure in socket. Move your weight back to equal on both legs, HOLD/FEEL  pressure in socket. Repeat.  Start with both hands on sink, progress to hand on prosthetic side only, then no hands.  Moving Cones / Cups: With equal weight on each leg: Hold on with one hand the first time, then progress to no hand supports. Move cups from one side of sink to the other. Place cups ~2" out of your reach, progress to 10" beyond reach.  Place one hand in middle of sink and reach with other hand. Do both arms.  Then  hover one hand and move cups with other hand.  Overhead/Upward Reaching: alternated reaching up to top cabinets or ceiling if no cabinets present. Keep equal weight on each leg. Start with one hand support on counter while other hand reaches and progress to no hand support with reaching.  ace one hand in middle of sink and reach with other hand. Do both arms.  Then hover one hand and move cups with other hand.  5.   Looking Over Shoulders: With equal weight on each leg: alternate turning to look over your shoulders with one hand support on counter as needed.  Start with head motions only to look in front of shoulder, then even with shoulder and progress to looking behind you. To look to side, move head /eyes, then shoulder on side looking pulls back, shift more weight to side looking and pull hip back. Place one hand in middle of sink and let go with other hand so your shoulder  can pull back. Switch hands to look other way.   Then hover one hand and look over shoulder. If looking right, use left hand at sink. If looking left, use right hand at sink. 6.  Stepping with leg that is not amputated:  Move items under cabinet out of your way. Shift your hips/pelvis so weight on prosthesis. Tighten muscles in hip on prosthetic side.  SLOWLY step other leg so front of foot is in cabinet. Then step back to floor.    ASSESSMENT:  CLINICAL IMPRESSION: Patient demonstrated improved balance reactions with UE resistive activity as level of assist was lower than previous session. Patient will benefit from continued trials with forearm crutches as she has progressed well with the RW. Patient continues to demonstrate decreased strength and coordination and will benefit from continued skilled physical therapy.  OBJECTIVE IMPAIRMENTS: Abnormal gait, decreased activity tolerance, decreased balance, decreased endurance, decreased knowledge of condition, decreased knowledge of use of DME, decreased mobility, difficulty walking,  decreased ROM, decreased strength, increased edema, impaired flexibility, postural dysfunction, prosthetic dependency , obesity, and pain.   ACTIVITY LIMITATIONS: carrying, lifting, bending, sitting, standing, stairs, transfers, and locomotion level  PARTICIPATION LIMITATIONS: meal prep, cleaning, and community activity  PERSONAL FACTORS: Fitness, Time since onset of injury/illness/exacerbation, and 3+ comorbidities: see PMH  are also affecting patient's functional outcome.   REHAB POTENTIAL: Good  CLINICAL DECISION MAKING: Evolving/moderate complexity  EVALUATION COMPLEXITY: Moderate   GOALS: Goals reviewed with patient? Yes  SHORT TERM GOALS: Target date: 02/25/2023  Patient donnes prosthesis modified independent & verbalizes proper cleaning. Baseline: SEE OBJECTIVE DATA Goal status: MET 02/25/2023 2.  Patient tolerates prosthesis >10 hrs total /day without skin issues or limb pain after standing. Baseline: SEE OBJECTIVE DATA Goal status: MET 02/25/2023  3.  Berg Balance >20/56 Baseline: SEE OBJECTIVE DATA Goal status: MET 03/02/2023  4. Patient ambulates 48' with RW & prosthesis with supervision. Baseline: SEE OBJECTIVE DATA Goal status: MET 03/23/2023  5. Patient negotiates ramps & curbs with RW & prosthesis with minA. Baseline: SEE OBJECTIVE DATA Goal status: MET  03/25/2023  LONG TERM GOALS: Target date: 04/22/2023  Patient demonstrates & verbalized understanding of prosthetic care to enable safe utilization of prosthesis. Baseline: SEE OBJECTIVE DATA Goal status: Ongoing   03/23/2023  Patient tolerates prosthesis wear >90% of awake hours without skin or residual limb pain issues. Baseline: SEE OBJECTIVE DATA Goal status: Ongoing   03/23/2023  Berg Balance >35/56 to indicate lower fall risk Baseline: SEE OBJECTIVE DATA Goal status: Ongoing   03/23/2023  Patient ambulates >300' with prosthesis and cane or less modified independent. Baseline: SEE OBJECTIVE DATA Goal  status: Ongoing   03/23/2023  Patient negotiates ramps, curbs & stairs with single rail with prosthesis  and cane or less modified independent. Baseline: SEE OBJECTIVE DATA Goal status: Ongoing    03/23/2023   Patient reports right knee pain </= 2/10 with standing and gait activities.   Baseline: SEE OBJECTIVE DATA Goal status:   Ongoing    03/23/2023  PLAN:  PT FREQUENCY:  3 x for 1 week, then 2x/week  PT DURATION: 90 days / 13 weeks  PLANNED INTERVENTIONS: 97164- PT Re-evaluation, 97110-Therapeutic exercises, 97530- Therapeutic activity, 97112- Neuromuscular re-education, 97535- Self Care, 16109- Gait training, [phone]- Prosthetic training, Patient/Family education, Balance training, Stair training, DME instructions, and physical performance testing  PLAN FOR NEXT SESSION:  revisit 4pt pattern with visual aid, trial 4 point forearm ambulation if able, continue or progress resisted balance activities  Itsel Opfer, Anees Vanecek, Student-PT 03/25/2023, 1:41 PM  This entire session of physical therapy was performed under the direct supervision of PT signing evaluation /treatment. PT reviewed note and agrees.   Vladimir Faster, PT, DPT 03/25/2023, 2:21 PM    Date of referral: 12/22/2022 Referring provider: Aldean Baker, MD Referring diagnosis? Z61.096 (ICD-10-CM) - Hx of AKA (above knee amputation), left  Treatment diagnosis? (if different than referring diagnosis)  Other abnormalities of gait and mobility  ICD-10-CM: R26.89    Unsteadiness on feet  ICD-10-CM: R26.81    Muscle weakness (generalized)  ICD-10-CM: M62.81    Chronic pain of right knee  M25.561 & G89.29   History of falling Z91.81   What was this (referring dx) caused by? Surgery (Type: AKA)   Nature of Condition: Initial Onset (within last 3 months) for prosthesis delivery              Laterality: Lt   Current Functional Measure Score: Other 03/02/2023: Sharlene Motts Balance 22/56  at eval Berg was 15/56   Objective  measurements identify impairments when they are compared to normal values, the uninvolved extremity, and prior level of function.             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  She has improved wear & care with PT education.     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     Average pain intensity:             Last 24 hours: 1-2/10             Past week: 1-2/10   How often does the pt experience symptoms? Constantly   How much have the symptoms interfered with usual daily activities? Extremely   How has condition changed since care began at this facility? Better   In general, how is the patients overall health? Very Good     BACK PAIN (STarT Back Screening Tool) No

## 2023-03-25 NOTE — Progress Notes (Signed)
Office Visit Note   Patient: Tammy [name]           Date of Birth: May 09, 1955           MRN: [id number] Visit Date: 03/25/2023              Requested by: Quitman Livings, MD [address] [place],  [place] [address] PCP: Quitman Livings, MD  Chief Complaint  Patient presents with   Left Leg - Follow-up    Hx AKA      HPI: Patient is a 68 year old woman who is 5 months status post left above-knee amputation.  She is working with therapy upstairs.  Patient states she has been having volume changes in her residual limb and problems with subsiding into the socket.  Assessment & Plan: Visit Diagnoses:  1. Hx of AKA (above knee amputation), left (HCC)     Plan: Patient will continue with outpatient therapy upstairs with [facility].  Follow-up in 3 months at which time anticipate she will need a new socket.  Follow-Up Instructions: Return in about 3 months (around 06/22/2023).   Ortho Exam  Patient is alert, oriented, no adenopathy, well-dressed, normal affect, normal respiratory effort. Examination patient is subsiding into her socket.  She is wearing ply socks without a good fit.  No open ulcers.  Imaging: No results found. No images are attached to the encounter.  Labs: Lab Results  Component Value Date   HGBA1C 5.6 03/17/2022   HGBA1C 5.6 12/01/2018   HGBA1C 5.9 (H) 05/11/2015   ESRSEDRATE 38 (H) 03/17/2022   CRP 9.6 (H) 11/03/2022   CRP 68.5 (H) 09/22/2022   CRP 47.9 (H) 08/25/2022   REPTSTATUS 11/09/2022 FINAL 11/04/2022   GRAMSTAIN NO WBC SEEN NO ORGANISMS SEEN  11/04/2022   CULT  11/04/2022    No growth aerobically or anaerobically. Performed at [facility], [address] [place], [place] [address]    [facility] ENTEROCOCCUS FAECALIS 06/07/2022     Lab Results  Component Value Date   ALBUMIN 3.2 (L) 02/21/2023   ALBUMIN 2.9 (L) 02/20/2023   ALBUMIN 3.2 (L) 02/19/2023    Lab Results  Component Value Date   MG 2.0 02/21/2023   MG 1.7  02/20/2023   MG 1.8 02/19/2023   Lab Results  Component Value Date   VD25OH 10.88 (L) 04/11/2021    No results found for: "PREALBUMIN"    Latest Ref Rng & Units 02/21/2023    3:36 AM 02/20/2023    9:59 PM 02/20/2023   11:59 AM  CBC EXTENDED  WBC 4.0 - 10.5 K/uL 5.7   6.9   RBC 3.87 - 5.11 MIL/uL 4.31   2.43   Hemoglobin 12.0 - 15.0 g/dL 21.3  08.6  6.3   HCT 57.8 - 46.0 % 36.5  34.5  20.3   Platelets 150 - 400 K/uL 236   293   NEUT# 1.7 - 7.7 K/uL 3.7     Lymph# 0.7 - 4.0 K/uL 1.4        There is no height or weight on file to calculate BMI.  Orders:  No orders of the defined types were placed in this encounter.  No orders of the defined types were placed in this encounter.    Procedures: No procedures performed  Clinical Data: No additional findings.  ROS:  All other systems negative, except as noted in the HPI. Review of Systems  Objective: Vital Signs: There were no vitals taken for this visit.  Specialty Comments:  No specialty comments available.  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, left, with nonunion, subsequent encounter 04/30/2022   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 04/30/2022   Hypokalemia 04/16/2021   Hip fracture requiring operative repair (HCC) 04/16/2021   Vitamin D deficiency 04/12/2021   Hyponatremia 04/11/2021    Acute postoperative anemia due to expected blood loss 04/11/2021   Class 1 obesity 04/11/2021   Left comminuted and displaced fracture of the distal femur metaphysis 04/08/2021   History of CVA (cerebrovascular accident) 04/08/2021   Genetic testing 07/17/2020   Family history of breast cancer 07/10/2020   Family history of ovarian cancer 07/10/2020   Family history of colon cancer 07/10/2020   Malignant neoplasm of upper-outer quadrant of left breast in female, estrogen receptor positive (HCC) 07/09/2020   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   Chronic pain syndrome 10/06/2012   Nicotine dependence 10/06/2012   Anxiety state 10/05/2012   Bipolar disorder, unspecified (HCC) 10/05/2012   Slurred speech 10/04/2012   Left-sided weakness 10/04/2012   Hyperkalemia 10/04/2012   Acute CVA (cerebrovascular accident) (HCC) 10/04/2012   Essential hypertension    Past Medical History:  Diagnosis Date   Anemia    hx of   Anxiety    on meds   Arthritis    back/bilateral ankles   Bipolar disorder (HCC)    Breast cancer (HCC) 2022   LEFT breast-radiation   Depression    on meds   Family history of breast cancer 07/10/2020   Family history of colon cancer 07/10/2020   Family history of ovarian cancer 07/10/2020   GERD (gastroesophageal reflux disease)    on meds   Heart murmur    dx by PCP   Hepatitis C    HTN (hypertension)    on meds   Hx of radiation therapy    Liver cirrhosis (HCC)    from hep c   Neuromuscular disorder (HCC)    Osteopenia    Personal history of radiation therapy    Stroke (HCC) 09/2012   slurred speech and LEFT sided weakness/notes 10/04/2012    Family History  Problem Relation Age of Onset   Breast cancer Mother    Other Mother        blood clots   Diabetes Sister    Neuropathy Sister        diabetic neuropathy   Breast cancer Maternal Aunt        50   Colon cancer Maternal Aunt 69   Diabetes Paternal Aunt    Diabetes Brother     Neuropathy Brother        diabetic neuropathy   Breast cancer Other        PGM's sister; dx mid 60s   Colon cancer Other        PGM's brother; dx 67s   Ovarian cancer Other        PGM's mother; dx unknown age  Esophageal cancer Neg Hx    Rectal cancer Neg Hx    Stomach cancer Neg Hx    Colon polyps Neg Hx     Past Surgical History:  Procedure Laterality Date   AMPUTATION Left 11/04/2022   Procedure: LEFT ABOVE KNEE AMPUTATION;  Surgeon: Nadara Mustard, MD;  Location: [facility] OR;  Service: Orthopedics;  Laterality: Left;   ANKLE FRACTURE SURGERY Left 2018   APPLICATION OF WOUND VAC Left 06/15/2022   Procedure: APPLICATION OF WOUND VAC;  Surgeon: Peggye Form, DO;  Location: [facility];  Service: Plastics;  Laterality: Left;   APPLICATION OF WOUND VAC Left 09/14/2022   Procedure: APPLICATION OF WOUND VAC;  Surgeon: Peggye Form, DO;  Location: [facility] OR;  Service: Plastics;  Laterality: Left;   BREAST LUMPECTOMY Left 07/2020   BREAST LUMPECTOMY WITH RADIOACTIVE SEED AND SENTINEL LYMPH NODE BIOPSY Left 07/26/2020   Procedure: LEFT BREAST LUMPECTOMY WITH RADIOACTIVE SEED AND SENTINEL LYMPH NODE BIOPSY;  Surgeon: Abigail Miyamoto, MD;  Location: [facility];  Service: General;  Laterality: Left;   CESAREAN SECTION  1986   COLONOSCOPY     DIAGNOSTIC LAPAROSCOPY  1985   ESOPHAGOGASTRODUODENOSCOPY N/A 02/21/2023   Procedure: ESOPHAGOGASTRODUODENOSCOPY (EGD);  Surgeon: Hilarie Fredrickson, MD;  Location: [facility];  Service: Gastroenterology;  Laterality: N/A;   HARDWARE REMOVAL Left 04/30/2022   Procedure: HARDWARE REMOVAL LEFT FEMUR;  Surgeon: Samson Frederic, MD;  Location: [facility];  Service: Orthopedics;  Laterality: Left;  150   HARDWARE REMOVAL Left 11/04/2022   Procedure: HARDWARE REMOVAL INFECTED LEFT TOTAL KNEE ARTHROPLASTY;  Surgeon: Nadara Mustard, MD;  Location: [facility] OR;  Service: Orthopedics;  Laterality: Left;   I & D KNEE WITH POLY EXCHANGE Left 06/07/2022    Procedure: IRRIGATION AND DEBRIDEMENT KNEE WITH POLY EXCHANGE;  Surgeon: Samson Frederic, MD;  Location: [facility];  Service: Orthopedics;  Laterality: Left;   INCISION AND DRAINAGE OF WOUND Left 06/04/2022   Procedure: IRRIGATION AND DEBRIDEMENT KNEE;  Surgeon: Samson Frederic, MD;  Location: [facility];  Service: Orthopedics;  Laterality: Left;  60   INCISION AND DRAINAGE OF WOUND Left 06/15/2022   Procedure: excision of knee wound with Myriad;  Surgeon: Peggye Form, DO;  Location: [facility];  Service: Plastics;  Laterality: Left;   ORIF FEMUR FRACTURE Left 04/10/2021   Procedure: OPEN REDUCTION INTERNAL FIXATION (ORIF) DISTAL FEMUR FRACTURE;  Surgeon: Myrene Galas, MD;  Location: [facility] OR;  Service: Orthopedics;  Laterality: Left;   PARTIAL KNEE ARTHROPLASTY Left 04/30/2022   Procedure: DISTAL FEMUR REPLACEMENT;  Surgeon: Samson Frederic, MD;  Location: [facility];  Service: Orthopedics;  Laterality: Left;  150   Social History   Occupational History   Occupation: diabled  Tobacco Use   Smoking status: Former    Current packs/day: 1.50    Average packs/day: 1.5 packs/day for 40.0 years (60.0 ttl pk-yrs)    Types: Cigarettes   Smokeless tobacco: Never   Tobacco comments:    has used e-cigarettes, no vaping   Vaping Use   Vaping status: Never Used  Substance and Sexual Activity   Alcohol use: Not Currently   Drug use: No   Sexual activity: Not Currently

## 2023-03-26 ENCOUNTER — Other Ambulatory Visit: Payer: Self-pay | Admitting: Nurse Practitioner

## 2023-03-26 ENCOUNTER — Other Ambulatory Visit: Payer: Self-pay

## 2023-03-26 MED ORDER — GABAPENTIN 300 MG PO CAPS: 300.0000 mg | ORAL_CAPSULE | Freq: Every day | ORAL | 1 refills | Status: DC

## 2023-03-26 NOTE — Telephone Encounter (Signed)
I discontinued the lyrica from her chart and refilled her gabapentin. She is due to have follow up per Dr. Latanya Maudlin note in 04/2023. She needs to get this appointment set up. Thanks.  Herbert Seta

## 2023-03-30 ENCOUNTER — Encounter: Payer: Self-pay | Admitting: Physical Therapy

## 2023-03-30 ENCOUNTER — Ambulatory Visit (INDEPENDENT_AMBULATORY_CARE_PROVIDER_SITE_OTHER): Payer: Medicare Other | Admitting: Physical Therapy

## 2023-03-30 DIAGNOSIS — Z89612 Acquired absence of left leg above knee: Secondary | ICD-10-CM

## 2023-03-30 DIAGNOSIS — R2689 Other abnormalities of gait and mobility: Secondary | ICD-10-CM

## 2023-03-30 DIAGNOSIS — M6281 Muscle weakness (generalized): Secondary | ICD-10-CM | POA: Diagnosis not present

## 2023-03-30 DIAGNOSIS — R262 Difficulty in walking, not elsewhere classified: Secondary | ICD-10-CM

## 2023-03-30 DIAGNOSIS — R2681 Unsteadiness on feet: Secondary | ICD-10-CM

## 2023-03-30 NOTE — Therapy (Cosign Needed)
OUTPATIENT PHYSICAL THERAPY PROSTHETIC TREATMENT   Patient Name: Tammy [name] MRN: [id number] DOB:03-19-1955, 68 y.o., female Today's Date: 03/31/2023    END OF SESSION:  PT End of Session - 03/30/23 1107     Visit Number 13    Number of Visits 27    Date for PT Re-Evaluation 04/22/23    Authorization Type [employer] Medicare    Authorization Time Period $20 COPAY 16 PT VISITS APPROVED 12/16-2/11/2023    Authorization - Visit Number 13    Authorization - Number of Visits 16    Progress Note Due on Visit 20    PT Start Time 1106    PT Stop Time 1145    PT Time Calculation (min) 39 min    Equipment Utilized During Treatment Gait belt    Activity Tolerance Patient tolerated treatment well    Behavior During Therapy WFL for tasks assessed/performed              Past Medical History:  Diagnosis Date   Anemia    hx of   Anxiety    on meds   Arthritis    back/bilateral ankles   Bipolar disorder (HCC)    Breast cancer (HCC) 2022   LEFT breast-radiation   Depression    on meds   Family history of breast cancer 07/10/2020   Family history of colon cancer 07/10/2020   Family history of ovarian cancer 07/10/2020   GERD (gastroesophageal reflux disease)    on meds   Heart murmur    dx by PCP   Hepatitis C    HTN (hypertension)    on meds   Hx of radiation therapy    Liver cirrhosis (HCC)    from hep c   Neuromuscular disorder (HCC)    Osteopenia    Personal history of radiation therapy    Stroke (HCC) 09/2012   slurred speech and LEFT sided weakness/notes 10/04/2012   Past Surgical History:  Procedure Laterality Date   AMPUTATION Left 11/04/2022   Procedure: LEFT ABOVE KNEE AMPUTATION;  Surgeon: Nadara Mustard, MD;  Location: [facility] OR;  Service: Orthopedics;  Laterality: Left;   ANKLE FRACTURE SURGERY Left 2018   APPLICATION OF WOUND VAC Left 06/15/2022   Procedure: APPLICATION OF WOUND VAC;  Surgeon: Peggye Form, DO;  Location: [facility];  Service:  Plastics;  Laterality: Left;   APPLICATION OF WOUND VAC Left 09/14/2022   Procedure: APPLICATION OF WOUND VAC;  Surgeon: Peggye Form, DO;  Location: [facility] OR;  Service: Plastics;  Laterality: Left;   BREAST LUMPECTOMY Left 07/2020   BREAST LUMPECTOMY WITH RADIOACTIVE SEED AND SENTINEL LYMPH NODE BIOPSY Left 07/26/2020   Procedure: LEFT BREAST LUMPECTOMY WITH RADIOACTIVE SEED AND SENTINEL LYMPH NODE BIOPSY;  Surgeon: Abigail Miyamoto, MD;  Location: [facility];  Service: General;  Laterality: Left;   CESAREAN SECTION  1986   COLONOSCOPY     DIAGNOSTIC LAPAROSCOPY  1985   ESOPHAGOGASTRODUODENOSCOPY N/A 02/21/2023   Procedure: ESOPHAGOGASTRODUODENOSCOPY (EGD);  Surgeon: Hilarie Fredrickson, MD;  Location: [facility];  Service: Gastroenterology;  Laterality: N/A;   HARDWARE REMOVAL Left 04/30/2022   Procedure: HARDWARE REMOVAL LEFT FEMUR;  Surgeon: Samson Frederic, MD;  Location: [facility];  Service: Orthopedics;  Laterality: Left;  150   HARDWARE REMOVAL Left 11/04/2022   Procedure: HARDWARE REMOVAL INFECTED LEFT TOTAL KNEE ARTHROPLASTY;  Surgeon: Nadara Mustard, MD;  Location: [facility] OR;  Service: Orthopedics;  Laterality: Left;   I & D KNEE  WITH POLY EXCHANGE Left 06/07/2022   Procedure: IRRIGATION AND DEBRIDEMENT KNEE WITH POLY EXCHANGE;  Surgeon: Samson Frederic, MD;  Location: [facility];  Service: Orthopedics;  Laterality: Left;   INCISION AND DRAINAGE OF WOUND Left 06/04/2022   Procedure: IRRIGATION AND DEBRIDEMENT KNEE;  Surgeon: Samson Frederic, MD;  Location: [facility];  Service: Orthopedics;  Laterality: Left;  60   INCISION AND DRAINAGE OF WOUND Left 06/15/2022   Procedure: excision of knee wound with Myriad;  Surgeon: Peggye Form, DO;  Location: [facility];  Service: Plastics;  Laterality: Left;   ORIF FEMUR FRACTURE Left 04/10/2021   Procedure: OPEN REDUCTION INTERNAL FIXATION (ORIF) DISTAL FEMUR FRACTURE;  Surgeon: Myrene Galas, MD;  Location: [facility] OR;  Service: Orthopedics;   Laterality: Left;   PARTIAL KNEE ARTHROPLASTY Left 04/30/2022   Procedure: DISTAL FEMUR REPLACEMENT;  Surgeon: Samson Frederic, MD;  Location: [facility];  Service: Orthopedics;  Laterality: Left;  150   Patient Active Problem List   Diagnosis Date Noted   Left femoral vein DVT (HCC) 02/20/2023   GERD (gastroesophageal reflux disease) 02/20/2023   Acute upper gastrointestinal bleeding 02/19/2023   Bipolar 1 disorder, depressed, partial remission (HCC) 11/18/2022   Left above-knee amputee (HCC) 11/13/2022   Infection of total knee replacement (HCC) 11/04/2022   S/P AKA (above knee amputation) unilateral, left (HCC) 11/04/2022   Chronic hepatitis C with cirrhosis (HCC) 06/06/2022   Surgical wound dehiscence, initial encounter 06/04/2022   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, left, with nonunion, subsequent encounter 04/30/2022   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 04/30/2022   Hypokalemia 04/16/2021   Hip fracture requiring operative repair (HCC) 04/16/2021   Vitamin D deficiency 04/12/2021   Hyponatremia 04/11/2021   Acute postoperative anemia due to expected blood loss 04/11/2021   Class 1 obesity 04/11/2021   Left comminuted and displaced fracture of the distal femur metaphysis 04/08/2021   History of CVA (cerebrovascular accident) 04/08/2021   Genetic testing 07/17/2020   Family history of breast cancer 07/10/2020   Family history of ovarian cancer 07/10/2020   Family history of colon cancer 07/10/2020   Malignant neoplasm of upper-outer quadrant of left breast in female, estrogen receptor positive (HCC) 07/09/2020   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   Chronic pain syndrome 10/06/2012   Nicotine dependence 10/06/2012   Anxiety state 10/05/2012   Bipolar disorder, unspecified (HCC) 10/05/2012   Slurred speech 10/04/2012   Left-sided weakness 10/04/2012   Hyperkalemia 10/04/2012   Acute CVA  (cerebrovascular accident) (HCC) 10/04/2012   Essential hypertension     PCP: Quitman Livings, MD  REFERRING PROVIDER: Aldean Baker, MD  ONSET DATE: 01/25/2023 prosthesis delivery  REFERRING DIAG: J47.829 (ICD-10-CM) - Hx of AKA (above knee amputation), left   THERAPY DIAG:  Other abnormalities of gait and mobility  Unsteadiness on feet  Muscle weakness (generalized)  S/P AKA (above knee amputation) unilateral, left (HCC)  Difficulty in walking, not elsewhere classified  Rationale for Evaluation and Treatment: Rehabilitation  SUBJECTIVE:   SUBJECTIVE STATEMENT: Feels good today, no pain however needed to take pain med due back pain and HS pain- this is typical for patient. Was able to do the stairs and the ramp at home. Made adjustments to sock ply- arrives wearing 5ply today and feels good. Was able to practice 4pt gait with visual aides only (not attempted at home).   Pt accompanied by: [name], significant other  PERTINENT HISTORY: AKA, anemia, anxiety, arthritis, Bipolar disorder, Breast CA  left with radiation, HTN, Hep C, liver cirrhosis, Neuromuscular disorder, osteopenia, stroke Left hemiparesis 2014  PAIN:  Are you having pain? Yes, 1/10 Pain location: residual limb Pain description: soreness Aggravating factors: standing or walking too much, or twisting motion Relieving factors: resting  PRECAUTIONS: Fall  WEIGHT BEARING RESTRICTIONS: No  FALLS: Has patient fallen in last 6 months? Yes. Number of falls 1 no injuries  LIVING ENVIRONMENT: Lives with: lives with their spouse Lives in: Mobile home Home Access: Ramped entrance or 4 steps with double rail to [employer] layout: One level Has following equipment at home: Single point cane, Environmental consultant - 2 wheeled, Wheelchair (manual), Shower bench, bed side commode, Grab bars, and Ramped entry  OCCUPATION: retired from nursing  PLOF: Independent prior to Fracture 04/10/2021.  Since fracture was able to get to cane for  community until hardware broke.    PATIENT GOALS:  walk independently in community, travel, drive, take care of 4yo great granddaughter  OBJECTIVE:  COGNITION: Overall cognitive status: Within functional limits for tasks assessed  POSTURE: rounded shoulders, forward head, flexed trunk , and weight shift right  LOWER EXTREMITY ROM:  ROM P:passive  A:active Right eval Left eval  Hip flexion    Hip extension    Hip abduction    Hip adduction    Hip internal rotation    Hip external rotation    Knee flexion    Knee extension    Ankle dorsiflexion    Ankle plantarflexion    Ankle inversion    Ankle eversion     (Blank rows = not tested)  LOWER EXTREMITY MMT:  MMT Right eval Left eval  Hip flexion    Hip extension    Hip abduction    Hip adduction    Hip internal rotation    Hip external rotation    Knee flexion    Knee extension    Ankle dorsiflexion    Ankle plantarflexion    Ankle inversion    Ankle eversion    At Evaluation all strength testing is grossly seated and functionally standing / gait. (Blank rows = not tested)  TRANSFERS: Evaluation on 01/25/2023:  Sit to stand: SBA w/c to RW requires armrests on w/c and stabilization with RW.   Stand to sit: SBA RW to w/c requires armrests on w/c and stabilization with RW.    FUNCTIONAL TESTs:  03/02/2023: Sharlene Motts Balance 22/56  Eval: Berg Balance Scale: 15/56   GAIT: 03/18/2023: -Patient ambulated 117ft and performing a 180deg turn CGA for patient safety with intermittent SBA  01/26/2023: Patient ambulated 10 feet with rolling walker with minA but required constant cueing for technique and safety.  PALPATION: 03/18/2023: patient verbalized seeing areas of discoloration on residual limb a few days prior. Therapist palpation and examination of area, did not see any noticeable areas with patient stating that the areas must have gone away without issue.    CURRENT PROSTHETIC WEAR ASSESSMENT: Evaluation on  01/25/2023:  Patient is dependent with: skin check, residual limb care, care of non-amputated limb, prosthetic cleaning, ply sock cleaning, correct ply sock adjustment, proper wear schedule/adjustment, and proper weight-bearing schedule/adjustment Donning prosthesis: Min A Doffing prosthesis: SBA Prosthetic wear tolerance: tolerated wear 1 hour prior to PT evaluation.  Prosthetic weight bearing tolerance: 5 minutes with BUE support on RW with limited wear on prosthesis.  Edema: pitting Prosthetic description: Ischial Containment socket with flexible inner socket, silicon liner with velcro strap suspension.   TODAY'S TREATMENT:  DATE:  03/30/2023: Therex: Nu step seat 9, level 3 for endurance, . Educated on increasing endurance with recumbent stepper or bike at [facility] so that she is able to walk into buildings instead of being pushed in wc. Pt verbalized general understanding.   While on nustep, educated on transport chair and its use, use as a rest point if ambulating longer distances at home, proper specs for ease of mobility and reduction of caretaker burden. Pt verbalized understanding.     Prosthetic Training with Transfemoral Prosthesis: Patient ambulated 57ft with RW SBA with step-to gait. PT verbal cues to facilitate step through pattern to facilitate prosthesis control.   STS with RW from 18in chair with arm rest SBA with patient demonstrating increased confidence and safety  Neuro: Standing balance with blue TB and RW support/RW in front of patient if patient were to need it:  BUEs rows x10 ea unilateral/bilateral/alternating; CGA throughout with a few instances of patient requiring RW assist to regain balance.  UE punches at 90deg x10 ea unilateral with RW support then without UE support with patient demonstrating good weight shifting, Few instances of patient  needing to regain balance with RW. CGA for safety; vc for proper foot placement for more secure balance  Unilateral UE shoulder extension Green TB with UE support X 10 reps with tactile cues for balance and verbal cues on technique.  Unilateral UE tricep extension green TB with UE support X 10 reps with tactile cues for balance and verbal cues on technique.     TREATMENT:                                                                                                                             DATE:  03/25/2023: Prosthetic Training with Transfemoral Prosthesis: PT educated pt on 4-point sequencing and pattern via visual aids to understand UE and LE placement when using forearm crutches. Patient ambulated with 4point gait patttern 2' but deemed unsafe today.  Due to the height of the crutches on hand, ambulation was terminated as it was unsafe and not favorable to the patient to progress ambulation.   Patient able to ambulate with RW 82ftx2 CGA and reciprocal gait pattern. However when turning to sit in 18in chair patient lost balance requiring maxA at gait belt from therapist to prevent fall. Patient did not lock knee out before shifting weight causing the knee to buckle under the patient as they were turning with the RW.    Curb and ramp negotiation with RW CGA increased to MinA as patient demonstrated unsteadiness with ascending ramp due to not locking knee before weight acceptance; vc for proper sequencing    STS with RW 18in chair with no armrest; CGA for patient safety. Patient able to reach for seat and unlock prosthetic leg to lower down under control without issue.  Neuro: Standing balance with green TB and RW support/RW in front of patient if patient were to need it:  UE punches  at 90deg x10 ea unilateral with RW support then alternating BUE with patient demonstrating good weight shifting, 1 instance of MinA due to fatigue from punching. CGA for safety; vc for proper foot placement  BUEs  rows x10 ea unilateral/bilateral/alternating; CGA throughout with a few instances of patient requiring RW assist to regain balance.   TREATMENT:                                                                                                                             DATE:  03/23/2023: Prosthetic Training with Transfemoral Prosthesis: -Patient ascended/descended 9 steps CGA with 2 instances of MinA due to fatigue. Patient negotiated use of BUE on bilateral railings for 4 steps and via side stepping BUEs on L railing for 5 step. Increased fatigue with patient requiring heavy vc for proper sequencing of LE during ascent. Required TC to ensure leg was fully locked out before fully accepting/transferring weight. SPT demo & verbal cues on technique.  -pt amb 50' X 2 with RW with minA/CGA with cues for upright posture and weight shift over prosthesis in stance.    Neuro: Standing balance with green TB UE flexion unilateral with RW support then BUEs with minA: 10 reps ea single UE and BUEs rows, forward reach and biceps curl/upward reach. All with RW directly in front of patient and holding on when doing unilateral activity. CGA throughout with a few instances of MinA due to fatigue and increased resistance with punching activity.    TREATMENT:                                                                                                                             DATE:  03/18/2023: Prosthetic Training with Transfemoral Prosthesis: -Patient demonstrated independence with doffing prosthesis and set up assist to don prosthesis due to the thickness of the strap trying to fit into the socket hole.  -Patient ambulated 124ft and performing a 180deg turn CGA for patient safety with intermittent SBA -Patient educated on products to reduce sweat within liner along with key points for recognition of when she is sweating in the liner. Patient and significant other verbalizing understanding. -Patient significant  other educated on proper and safe wc follow when patient is ambulating with RW and use of gait belt for safety. Significant other verbalizing understanding.   HOME EXERCISE PROGRAM: Do each exercise 1-2  times per day Do each exercise 5-10 repetitions  Hold each exercise for 2 seconds to feel your location  AT [facility] FIND YOUR MIDLINE POSITION AND PLACE FEET EQUAL DISTANCE FROM THE MIDLINE.  Try to find this position when standing still for activities.   USE TAPE ON FLOOR TO MARK THE MIDLINE POSITION which is even with middle of sink.  You also should try to feel with your limb pressure in socket.  You are trying to feel with limb what you used to feel with the bottom of your foot.  Side to Side Shift: Moving your hips only (not shoulders): move weight onto your left leg, HOLD/FEEL pressure in socket.  Move back to equal weight on each leg, HOLD/FEEL pressure in socket. Move weight onto your right leg, HOLD/FEEL pressure in socket. Move back to equal weight on each leg, HOLD/FEEL pressure in socket. Repeat.  Start with both hands on sink, progress to hand on prosthetic side only, then no hands.  Front to Back Shift: Moving your hips only (not shoulders): move your weight forward onto your toes, HOLD/FEEL pressure in socket. Move your weight back to equal Flat Foot on both legs, HOLD/FEEL  pressure in socket. Move your weight back onto your heels, HOLD/FEEL  pressure in socket. Move your weight back to equal on both legs, HOLD/FEEL  pressure in socket. Repeat.  Start with both hands on sink, progress to hand on prosthetic side only, then no hands.  Moving Cones / Cups: With equal weight on each leg: Hold on with one hand the first time, then progress to no hand supports. Move cups from one side of sink to the other. Place cups ~2" out of your reach, progress to 10" beyond reach.  Place one hand in middle of sink and reach with other hand. Do both arms.  Then hover one hand and move cups with other hand.   Overhead/Upward Reaching: alternated reaching up to top cabinets or ceiling if no cabinets present. Keep equal weight on each leg. Start with one hand support on counter while other hand reaches and progress to no hand support with reaching.  ace one hand in middle of sink and reach with other hand. Do both arms.  Then hover one hand and move cups with other hand.  5.   Looking Over Shoulders: With equal weight on each leg: alternate turning to look over your shoulders with one hand support on counter as needed.  Start with head motions only to look in front of shoulder, then even with shoulder and progress to looking behind you. To look to side, move head /eyes, then shoulder on side looking pulls back, shift more weight to side looking and pull hip back. Place one hand in middle of sink and let go with other hand so your shoulder can pull back. Switch hands to look other way.   Then hover one hand and look over shoulder. If looking right, use left hand at sink. If looking left, use right hand at sink. 6.  Stepping with leg that is not amputated:  Move items under cabinet out of your way. Shift your hips/pelvis so weight on prosthesis. Tighten muscles in hip on prosthetic side.  SLOWLY step other leg so front of foot is in cabinet. Then step back to floor.    ASSESSMENT:  CLINICAL IMPRESSION: Patient did well with ambulation and resistance activities however continues to demonstrate balance deficits. Does best when given tc for proper form when doing resistance activities. Patient continues to demonstrate decreased strength and coordination and  will benefit from continued skilled physical therapy.  OBJECTIVE IMPAIRMENTS: Abnormal gait, decreased activity tolerance, decreased balance, decreased endurance, decreased knowledge of condition, decreased knowledge of use of DME, decreased mobility, difficulty walking, decreased ROM, decreased strength, increased edema, impaired flexibility, postural  dysfunction, prosthetic dependency , obesity, and pain.   ACTIVITY LIMITATIONS: carrying, lifting, bending, sitting, standing, stairs, transfers, and locomotion level  PARTICIPATION LIMITATIONS: meal prep, cleaning, and community activity  PERSONAL FACTORS: Fitness, Time since onset of injury/illness/exacerbation, and 3+ comorbidities: see PMH  are also affecting patient's functional outcome.   REHAB POTENTIAL: Good  CLINICAL DECISION MAKING: Evolving/moderate complexity  EVALUATION COMPLEXITY: Moderate   GOALS: Goals reviewed with patient? Yes  SHORT TERM GOALS: Target date: 02/25/2023  Patient donnes prosthesis modified independent & verbalizes proper cleaning. Baseline: SEE OBJECTIVE DATA Goal status: MET 02/25/2023 2.  Patient tolerates prosthesis >10 hrs total /day without skin issues or limb pain after standing. Baseline: SEE OBJECTIVE DATA Goal status: MET 02/25/2023  3.  Berg Balance >20/56 Baseline: SEE OBJECTIVE DATA Goal status: MET 03/02/2023  4. Patient ambulates 65' with RW & prosthesis with supervision. Baseline: SEE OBJECTIVE DATA Goal status: MET 03/23/2023  5. Patient negotiates ramps & curbs with RW & prosthesis with minA. Baseline: SEE OBJECTIVE DATA Goal status: MET  03/25/2023  LONG TERM GOALS: Target date: 04/22/2023  Patient demonstrates & verbalized understanding of prosthetic care to enable safe utilization of prosthesis. Baseline: SEE OBJECTIVE DATA Goal status: Ongoing   03/23/2023  Patient tolerates prosthesis wear >90% of awake hours without skin or residual limb pain issues. Baseline: SEE OBJECTIVE DATA Goal status: Ongoing   03/23/2023  Berg Balance >35/56 to indicate lower fall risk Baseline: SEE OBJECTIVE DATA Goal status: Ongoing   03/23/2023  Patient ambulates >300' with prosthesis and cane or less modified independent. Baseline: SEE OBJECTIVE DATA Goal status: Ongoing   03/23/2023  Patient negotiates ramps, curbs & stairs with single  rail with prosthesis  and cane or less modified independent. Baseline: SEE OBJECTIVE DATA Goal status: Ongoing    03/23/2023   Patient reports right knee pain </= 2/10 with standing and gait activities.   Baseline: SEE OBJECTIVE DATA Goal status:   Ongoing    03/23/2023  PLAN:  PT FREQUENCY:  3 x for 1 week, then 2x/week  PT DURATION: 90 days / 13 weeks  PLANNED INTERVENTIONS: 97164- PT Re-evaluation, 97110-Therapeutic exercises, 97530- Therapeutic activity, 97112- Neuromuscular re-education, [phone]- Self Care, 81191- Gait training, [phone]- Prosthetic training, Patient/Family education, Balance training, Stair training, DME instructions, and physical performance testing  PLAN FOR NEXT SESSION:  ambulation with step through gait pattern, trial 4 point forearm ambulation if able, continue or progress resisted balance activities    Quency Tober, Jencarlo Bonadonna, Student-PT 03/30/2023, 5:59 PM  This entire session of physical therapy was performed under the direct supervision of PT signing evaluation /treatment. PT reviewed note and agrees.   Vladimir Faster, PT, DPT 03/31/2023, 7:57 AM   Date of referral: 12/22/2022 Referring provider: Aldean Baker, MD Referring diagnosis? F62.130 (ICD-10-CM) - Hx of AKA (above knee amputation), left  Treatment diagnosis? (if different than referring diagnosis)  Other abnormalities of gait and mobility  ICD-10-CM: R26.89    Unsteadiness on feet  ICD-10-CM: R26.81    Muscle weakness (generalized)  ICD-10-CM: M62.81    Chronic pain of right knee  M25.561 & G89.29   History of falling Z91.81   What was this (referring dx) caused by? Surgery (Type: AKA)   Nature of Condition: Initial Onset (within  last 3 months) for prosthesis delivery              Laterality: Lt   Current Functional Measure Score: Other 03/02/2023: Sharlene Motts Balance 22/56  at eval Berg was 15/56   Objective measurements identify impairments when they are compared to normal values, the uninvolved  extremity, and prior level of function.             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  She has improved wear & care with PT education.     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     Average pain intensity:             Last 24 hours: 1-2/10             Past week: 1-2/10   How often does the pt experience symptoms? Constantly   How much have the symptoms interfered with usual daily activities? Extremely   How has condition changed since care began at this facility? Better   In general, how is the patients overall health? Very Good     BACK PAIN (STarT Back Screening Tool) No

## 2023-04-01 ENCOUNTER — Encounter: Payer: Medicare Other | Admitting: Physical Therapy

## 2023-04-05 ENCOUNTER — Encounter: Payer: Medicare Other | Admitting: Physical Therapy

## 2023-04-05 NOTE — Therapy (Deleted)
 OUTPATIENT PHYSICAL THERAPY PROSTHETIC TREATMENT   Patient Name: Tammy [name] MRN: [id number] DOB:April 09, 1955, 68 y.o., female Today's Date: 04/05/2023    END OF SESSION:     Past Medical History:  Diagnosis Date   Anemia    hx of   Anxiety    on meds   Arthritis    back/bilateral ankles   Bipolar disorder (HCC)    Breast cancer (HCC) 2022   LEFT breast-radiation   Depression    on meds   Family history of breast cancer 07/10/2020   Family history of colon cancer 07/10/2020   Family history of ovarian cancer 07/10/2020   GERD (gastroesophageal reflux disease)    on meds   Heart murmur    dx by PCP   Hepatitis C    HTN (hypertension)    on meds   Hx of radiation therapy    Liver cirrhosis (HCC)    from hep c   Neuromuscular disorder (HCC)    Osteopenia    Personal history of radiation therapy    Stroke (HCC) 09/2012   slurred speech and LEFT sided weakness/notes 10/04/2012   Past Surgical History:  Procedure Laterality Date   AMPUTATION Left 11/04/2022   Procedure: LEFT ABOVE KNEE AMPUTATION;  Surgeon: Nadara Mustard, MD;  Location: [facility] OR;  Service: Orthopedics;  Laterality: Left;   ANKLE FRACTURE SURGERY Left 2018   APPLICATION OF WOUND VAC Left 06/15/2022   Procedure: APPLICATION OF WOUND VAC;  Surgeon: Peggye Form, DO;  Location: [facility];  Service: Plastics;  Laterality: Left;   APPLICATION OF WOUND VAC Left 09/14/2022   Procedure: APPLICATION OF WOUND VAC;  Surgeon: Peggye Form, DO;  Location: [facility] OR;  Service: Plastics;  Laterality: Left;   BREAST LUMPECTOMY Left 07/2020   BREAST LUMPECTOMY WITH RADIOACTIVE SEED AND SENTINEL LYMPH NODE BIOPSY Left 07/26/2020   Procedure: LEFT BREAST LUMPECTOMY WITH RADIOACTIVE SEED AND SENTINEL LYMPH NODE BIOPSY;  Surgeon: Abigail Miyamoto, MD;  Location: [facility];  Service: General;  Laterality: Left;   CESAREAN SECTION  1986   COLONOSCOPY     DIAGNOSTIC LAPAROSCOPY  1985    ESOPHAGOGASTRODUODENOSCOPY N/A 02/21/2023   Procedure: ESOPHAGOGASTRODUODENOSCOPY (EGD);  Surgeon: Hilarie Fredrickson, MD;  Location: [facility];  Service: Gastroenterology;  Laterality: N/A;   HARDWARE REMOVAL Left 04/30/2022   Procedure: HARDWARE REMOVAL LEFT FEMUR;  Surgeon: Samson Frederic, MD;  Location: [facility];  Service: Orthopedics;  Laterality: Left;  150   HARDWARE REMOVAL Left 11/04/2022   Procedure: HARDWARE REMOVAL INFECTED LEFT TOTAL KNEE ARTHROPLASTY;  Surgeon: Nadara Mustard, MD;  Location: [facility] OR;  Service: Orthopedics;  Laterality: Left;   I & D KNEE WITH POLY EXCHANGE Left 06/07/2022   Procedure: IRRIGATION AND DEBRIDEMENT KNEE WITH POLY EXCHANGE;  Surgeon: Samson Frederic, MD;  Location: [facility];  Service: Orthopedics;  Laterality: Left;   INCISION AND DRAINAGE OF WOUND Left 06/04/2022   Procedure: IRRIGATION AND DEBRIDEMENT KNEE;  Surgeon: Samson Frederic, MD;  Location: [facility];  Service: Orthopedics;  Laterality: Left;  60   INCISION AND DRAINAGE OF WOUND Left 06/15/2022   Procedure: excision of knee wound with Myriad;  Surgeon: Peggye Form, DO;  Location: [facility];  Service: Plastics;  Laterality: Left;   ORIF FEMUR FRACTURE Left 04/10/2021   Procedure: OPEN REDUCTION INTERNAL FIXATION (ORIF) DISTAL FEMUR FRACTURE;  Surgeon: Myrene Galas, MD;  Location: [facility] OR;  Service: Orthopedics;  Laterality: Left;   PARTIAL KNEE ARTHROPLASTY Left  04/30/2022   Procedure: DISTAL FEMUR REPLACEMENT;  Surgeon: Samson Frederic, MD;  Location: [facility];  Service: Orthopedics;  Laterality: Left;  150   Patient Active Problem List   Diagnosis Date Noted   Left femoral vein DVT (HCC) 02/20/2023   GERD (gastroesophageal reflux disease) 02/20/2023   Acute upper gastrointestinal bleeding 02/19/2023   Bipolar 1 disorder, depressed, partial remission (HCC) 11/18/2022   Left above-knee amputee (HCC) 11/13/2022   Infection of total knee replacement (HCC) 11/04/2022   S/P AKA (above knee amputation)  unilateral, left (HCC) 11/04/2022   Chronic hepatitis C with cirrhosis (HCC) 06/06/2022   Surgical wound dehiscence, initial encounter 06/04/2022   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, left, with nonunion, subsequent encounter 04/30/2022   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 04/30/2022   Hypokalemia 04/16/2021   Hip fracture requiring operative repair (HCC) 04/16/2021   Vitamin D deficiency 04/12/2021   Hyponatremia 04/11/2021   Acute postoperative anemia due to expected blood loss 04/11/2021   Class 1 obesity 04/11/2021   Left comminuted and displaced fracture of the distal femur metaphysis 04/08/2021   History of CVA (cerebrovascular accident) 04/08/2021   Genetic testing 07/17/2020   Family history of breast cancer 07/10/2020   Family history of ovarian cancer 07/10/2020   Family history of colon cancer 07/10/2020   Malignant neoplasm of upper-outer quadrant of left breast in female, estrogen receptor positive (HCC) 07/09/2020   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   Chronic pain syndrome 10/06/2012   Nicotine dependence 10/06/2012   Anxiety state 10/05/2012   Bipolar disorder, unspecified (HCC) 10/05/2012   Slurred speech 10/04/2012   Left-sided weakness 10/04/2012   Hyperkalemia 10/04/2012   Acute CVA (cerebrovascular accident) (HCC) 10/04/2012   Essential hypertension     PCP: Quitman Livings, MD  REFERRING PROVIDER: Aldean Baker, MD  ONSET DATE: 01/25/2023 prosthesis delivery  REFERRING DIAG: B28.413 (ICD-10-CM) - Hx of AKA (above knee amputation), left   THERAPY DIAG:  No diagnosis found.  Rationale for Evaluation and Treatment: Rehabilitation  SUBJECTIVE:   SUBJECTIVE STATEMENT: Feels good today, no pain however needed to take pain med due back pain and HS pain- this is typical for patient. Was able to do the stairs and the ramp at home. Made adjustments to sock ply- arrives wearing 5ply  today and feels good. Was able to practice 4pt gait with visual aides only (not attempted at home).   Pt accompanied by: [name], significant other  PERTINENT HISTORY: AKA, anemia, anxiety, arthritis, Bipolar disorder, Breast CA left with radiation, HTN, Hep C, liver cirrhosis, Neuromuscular disorder, osteopenia, stroke Left hemiparesis 2014  PAIN:  Are you having pain? Yes, 1/10 Pain location: residual limb Pain description: soreness Aggravating factors: standing or walking too much, or twisting motion Relieving factors: resting  PRECAUTIONS: Fall  WEIGHT BEARING RESTRICTIONS: No  FALLS: Has patient fallen in last 6 months? Yes. Number of falls 1 no injuries  LIVING ENVIRONMENT: Lives with: lives with their spouse Lives in: Mobile home Home Access: Ramped entrance or 4 steps with double rail to [employer] layout: One level Has following equipment at home: Single point cane, Environmental consultant - 2 wheeled, Wheelchair (manual), Shower bench, bed side commode, Grab bars, and Ramped entry  OCCUPATION: retired from nursing  PLOF: Independent prior to Fracture 04/10/2021.  Since fracture was able to get to cane for community until hardware broke.    PATIENT GOALS:  walk independently in community, travel, drive, take care of 4yo  great granddaughter  OBJECTIVE:  COGNITION: Overall cognitive status: Within functional limits for tasks assessed  POSTURE: rounded shoulders, forward head, flexed trunk , and weight shift right  LOWER EXTREMITY ROM:  ROM P:passive  A:active Right eval Left eval  Hip flexion    Hip extension    Hip abduction    Hip adduction    Hip internal rotation    Hip external rotation    Knee flexion    Knee extension    Ankle dorsiflexion    Ankle plantarflexion    Ankle inversion    Ankle eversion     (Blank rows = not tested)  LOWER EXTREMITY MMT:  MMT Right eval Left eval  Hip flexion    Hip extension    Hip abduction    Hip adduction    Hip internal  rotation    Hip external rotation    Knee flexion    Knee extension    Ankle dorsiflexion    Ankle plantarflexion    Ankle inversion    Ankle eversion    At Evaluation all strength testing is grossly seated and functionally standing / gait. (Blank rows = not tested)  TRANSFERS: Evaluation on 01/25/2023:  Sit to stand: SBA w/c to RW requires armrests on w/c and stabilization with RW.   Stand to sit: SBA RW to w/c requires armrests on w/c and stabilization with RW.    FUNCTIONAL TESTs:  03/02/2023: Sharlene Motts Balance 22/56  Eval: Berg Balance Scale: 15/56   GAIT: 03/18/2023: -Patient ambulated 159ft and performing a 180deg turn CGA for patient safety with intermittent SBA  01/26/2023: Patient ambulated 10 feet with rolling walker with minA but required constant cueing for technique and safety.  PALPATION: 03/18/2023: patient verbalized seeing areas of discoloration on residual limb a few days prior. Therapist palpation and examination of area, did not see any noticeable areas with patient stating that the areas must have gone away without issue.    CURRENT PROSTHETIC WEAR ASSESSMENT: Evaluation on 01/25/2023:  Patient is dependent with: skin check, residual limb care, care of non-amputated limb, prosthetic cleaning, ply sock cleaning, correct ply sock adjustment, proper wear schedule/adjustment, and proper weight-bearing schedule/adjustment Donning prosthesis: Min A Doffing prosthesis: SBA Prosthetic wear tolerance: tolerated wear 1 hour prior to PT evaluation.  Prosthetic weight bearing tolerance: 5 minutes with BUE support on RW with limited wear on prosthesis.  Edema: pitting Prosthetic description: Ischial Containment socket with flexible inner socket, silicon liner with velcro strap suspension.   TODAY'S TREATMENT:                                                                                                                             DATE:  04/05/2023: Prosthetic Training with  Transfemoral Prosthesis: 4pt gait with lofstrand crutches   TREATMENT:  DATE:  03/30/2023: Therex: Nu step seat 9, level 3 for endurance, . Educated on increasing endurance with recumbent stepper or bike at [facility] so that she is able to walk into buildings instead of being pushed in wc. Pt verbalized general understanding.   While on nustep, educated on transport chair and its use, use as a rest point if ambulating longer distances at home, proper specs for ease of mobility and reduction of caretaker burden. Pt verbalized understanding.     Prosthetic Training with Transfemoral Prosthesis: Patient ambulated 6ft with RW SBA with step-to gait. PT verbal cues to facilitate step through pattern to facilitate prosthesis control.   STS with RW from 18in chair with arm rest SBA with patient demonstrating increased confidence and safety  Neuro: Standing balance with blue TB and RW support/RW in front of patient if patient were to need it:  BUEs rows x10 ea unilateral/bilateral/alternating; CGA throughout with a few instances of patient requiring RW assist to regain balance.  UE punches at 90deg x10 ea unilateral with RW support then without UE support with patient demonstrating good weight shifting, Few instances of patient needing to regain balance with RW. CGA for safety; vc for proper foot placement for more secure balance  Unilateral UE shoulder extension Green TB with UE support X 10 reps with tactile cues for balance and verbal cues on technique.  Unilateral UE tricep extension green TB with UE support X 10 reps with tactile cues for balance and verbal cues on technique.     TREATMENT:                                                                                                                             DATE:  03/25/2023: Prosthetic Training with Transfemoral  Prosthesis: PT educated pt on 4-point sequencing and pattern via visual aids to understand UE and LE placement when using forearm crutches. Patient ambulated with 4point gait patttern 2' but deemed unsafe today.  Due to the height of the crutches on hand, ambulation was terminated as it was unsafe and not favorable to the patient to progress ambulation.   Patient able to ambulate with RW 56ftx2 CGA and reciprocal gait pattern. However when turning to sit in 18in chair patient lost balance requiring maxA at gait belt from therapist to prevent fall. Patient did not lock knee out before shifting weight causing the knee to buckle under the patient as they were turning with the RW.    Curb and ramp negotiation with RW CGA increased to MinA as patient demonstrated unsteadiness with ascending ramp due to not locking knee before weight acceptance; vc for proper sequencing    STS with RW 18in chair with no armrest; CGA for patient safety. Patient able to reach for seat and unlock prosthetic leg to lower down under control without issue.  Neuro: Standing balance with green TB and RW support/RW in front of patient if patient were to need it:  UE punches  at 90deg x10 ea unilateral with RW support then alternating BUE with patient demonstrating good weight shifting, 1 instance of MinA due to fatigue from punching. CGA for safety; vc for proper foot placement  BUEs rows x10 ea unilateral/bilateral/alternating; CGA throughout with a few instances of patient requiring RW assist to regain balance.   TREATMENT:                                                                                                                             DATE:  03/23/2023: Prosthetic Training with Transfemoral Prosthesis: -Patient ascended/descended 9 steps CGA with 2 instances of MinA due to fatigue. Patient negotiated use of BUE on bilateral railings for 4 steps and via side stepping BUEs on L railing for 5 step. Increased fatigue with  patient requiring heavy vc for proper sequencing of LE during ascent. Required TC to ensure leg was fully locked out before fully accepting/transferring weight. SPT demo & verbal cues on technique.  -pt amb 50' X 2 with RW with minA/CGA with cues for upright posture and weight shift over prosthesis in stance.    Neuro: Standing balance with green TB UE flexion unilateral with RW support then BUEs with minA: 10 reps ea single UE and BUEs rows, forward reach and biceps curl/upward reach. All with RW directly in front of patient and holding on when doing unilateral activity. CGA throughout with a few instances of MinA due to fatigue and increased resistance with punching activity.    TREATMENT:                                                                                                                             DATE:  03/18/2023: Prosthetic Training with Transfemoral Prosthesis: -Patient demonstrated independence with doffing prosthesis and set up assist to don prosthesis due to the thickness of the strap trying to fit into the socket hole.  -Patient ambulated 138ft and performing a 180deg turn CGA for patient safety with intermittent SBA -Patient educated on products to reduce sweat within liner along with key points for recognition of when she is sweating in the liner. Patient and significant other verbalizing understanding. -Patient significant other educated on proper and safe wc follow when patient is ambulating with RW and use of gait belt for safety. Significant other verbalizing understanding.   HOME EXERCISE PROGRAM: Do each exercise 1-2  times per day Do each exercise 5-10 repetitions Hold  each exercise for 2 seconds to feel your location  AT [facility] FIND YOUR MIDLINE POSITION AND PLACE FEET EQUAL DISTANCE FROM THE MIDLINE.  Try to find this position when standing still for activities.   USE TAPE ON FLOOR TO MARK THE MIDLINE POSITION which is even with middle of sink.  You also should  try to feel with your limb pressure in socket.  You are trying to feel with limb what you used to feel with the bottom of your foot.  Side to Side Shift: Moving your hips only (not shoulders): move weight onto your left leg, HOLD/FEEL pressure in socket.  Move back to equal weight on each leg, HOLD/FEEL pressure in socket. Move weight onto your right leg, HOLD/FEEL pressure in socket. Move back to equal weight on each leg, HOLD/FEEL pressure in socket. Repeat.  Start with both hands on sink, progress to hand on prosthetic side only, then no hands.  Front to Back Shift: Moving your hips only (not shoulders): move your weight forward onto your toes, HOLD/FEEL pressure in socket. Move your weight back to equal Flat Foot on both legs, HOLD/FEEL  pressure in socket. Move your weight back onto your heels, HOLD/FEEL  pressure in socket. Move your weight back to equal on both legs, HOLD/FEEL  pressure in socket. Repeat.  Start with both hands on sink, progress to hand on prosthetic side only, then no hands.  Moving Cones / Cups: With equal weight on each leg: Hold on with one hand the first time, then progress to no hand supports. Move cups from one side of sink to the other. Place cups ~2" out of your reach, progress to 10" beyond reach.  Place one hand in middle of sink and reach with other hand. Do both arms.  Then hover one hand and move cups with other hand.  Overhead/Upward Reaching: alternated reaching up to top cabinets or ceiling if no cabinets present. Keep equal weight on each leg. Start with one hand support on counter while other hand reaches and progress to no hand support with reaching.  ace one hand in middle of sink and reach with other hand. Do both arms.  Then hover one hand and move cups with other hand.  5.   Looking Over Shoulders: With equal weight on each leg: alternate turning to look over your shoulders with one hand support on counter as needed.  Start with head motions only to look in front  of shoulder, then even with shoulder and progress to looking behind you. To look to side, move head /eyes, then shoulder on side looking pulls back, shift more weight to side looking and pull hip back. Place one hand in middle of sink and let go with other hand so your shoulder can pull back. Switch hands to look other way.   Then hover one hand and look over shoulder. If looking right, use left hand at sink. If looking left, use right hand at sink. 6.  Stepping with leg that is not amputated:  Move items under cabinet out of your way. Shift your hips/pelvis so weight on prosthesis. Tighten muscles in hip on prosthetic side.  SLOWLY step other leg so front of foot is in cabinet. Then step back to floor.    ASSESSMENT:  CLINICAL IMPRESSION: Patient did well with ambulation and resistance activities however continues to demonstrate balance deficits. Does best when given tc for proper form when doing resistance activities. Patient continues to demonstrate decreased strength and coordination and  will benefit from continued skilled physical therapy.  OBJECTIVE IMPAIRMENTS: Abnormal gait, decreased activity tolerance, decreased balance, decreased endurance, decreased knowledge of condition, decreased knowledge of use of DME, decreased mobility, difficulty walking, decreased ROM, decreased strength, increased edema, impaired flexibility, postural dysfunction, prosthetic dependency , obesity, and pain.   ACTIVITY LIMITATIONS: carrying, lifting, bending, sitting, standing, stairs, transfers, and locomotion level  PARTICIPATION LIMITATIONS: meal prep, cleaning, and community activity  PERSONAL FACTORS: Fitness, Time since onset of injury/illness/exacerbation, and 3+ comorbidities: see PMH  are also affecting patient's functional outcome.   REHAB POTENTIAL: Good  CLINICAL DECISION MAKING: Evolving/moderate complexity  EVALUATION COMPLEXITY: Moderate   GOALS: Goals reviewed with patient? Yes  SHORT  TERM GOALS: Target date: 02/25/2023  Patient donnes prosthesis modified independent & verbalizes proper cleaning. Baseline: SEE OBJECTIVE DATA Goal status: MET 02/25/2023 2.  Patient tolerates prosthesis >10 hrs total /day without skin issues or limb pain after standing. Baseline: SEE OBJECTIVE DATA Goal status: MET 02/25/2023  3.  Berg Balance >20/56 Baseline: SEE OBJECTIVE DATA Goal status: MET 03/02/2023  4. Patient ambulates 44' with RW & prosthesis with supervision. Baseline: SEE OBJECTIVE DATA Goal status: MET 03/23/2023  5. Patient negotiates ramps & curbs with RW & prosthesis with minA. Baseline: SEE OBJECTIVE DATA Goal status: MET  03/25/2023  LONG TERM GOALS: Target date: 04/22/2023  Patient demonstrates & verbalized understanding of prosthetic care to enable safe utilization of prosthesis. Baseline: SEE OBJECTIVE DATA Goal status: Ongoing   03/23/2023  Patient tolerates prosthesis wear >90% of awake hours without skin or residual limb pain issues. Baseline: SEE OBJECTIVE DATA Goal status: Ongoing   03/23/2023  Berg Balance >35/56 to indicate lower fall risk Baseline: SEE OBJECTIVE DATA Goal status: Ongoing   03/23/2023  Patient ambulates >300' with prosthesis and cane or less modified independent. Baseline: SEE OBJECTIVE DATA Goal status: Ongoing   03/23/2023  Patient negotiates ramps, curbs & stairs with single rail with prosthesis  and cane or less modified independent. Baseline: SEE OBJECTIVE DATA Goal status: Ongoing    03/23/2023   Patient reports right knee pain </= 2/10 with standing and gait activities.   Baseline: SEE OBJECTIVE DATA Goal status:   Ongoing    03/23/2023  PLAN:  PT FREQUENCY:  3 x for 1 week, then 2x/week  PT DURATION: 90 days / 13 weeks  PLANNED INTERVENTIONS: 97164- PT Re-evaluation, 97110-Therapeutic exercises, 97530- Therapeutic activity, 97112- Neuromuscular re-education, [phone]- Self Care, 32951- Gait training, [phone]- Prosthetic  training, Patient/Family education, Balance training, Stair training, DME instructions, and physical performance testing  PLAN FOR NEXT SESSION:  ambulation with step through gait pattern, trial 4 point forearm ambulation if able, continue or progress resisted balance activities    [employer], Ario Mcdiarmid, Student-PT 03/30/2023, 5:59 PM  This entire session of physical therapy was performed under the direct supervision of PT signing evaluation /treatment. PT reviewed note and agrees.   Andreas Blower, DPT 04/05/2023, 10:12 AM   Date of referral: 12/22/2022 Referring provider: Aldean Baker, MD Referring diagnosis? S06.301 (ICD-10-CM) - Hx of AKA (above knee amputation), left  Treatment diagnosis? (if different than referring diagnosis)  Other abnormalities of gait and mobility  ICD-10-CM: R26.89    Unsteadiness on feet  ICD-10-CM: R26.81    Muscle weakness (generalized)  ICD-10-CM: M62.81    Chronic pain of right knee  M25.561 & G89.29   History of falling Z91.81   What was this (referring dx) caused by? Surgery (Type: AKA)   Nature of Condition: Initial Onset (within  last 3 months) for prosthesis delivery              Laterality: Lt   Current Functional Measure Score: Other 03/02/2023: Sharlene Motts Balance 22/56  at eval Berg was 15/56   Objective measurements identify impairments when they are compared to normal values, the uninvolved extremity, and prior level of function.             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  She has improved wear & care with PT education.     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     Average pain intensity:             Last 24 hours: 1-2/10             Past week: 1-2/10   How often does the pt  experience symptoms? Constantly   How much have the symptoms interfered with usual daily activities? Extremely   How has condition changed since care began at this facility? Better   In general, how is the patients overall health? Very Good     BACK PAIN (STarT Back Screening Tool) No

## 2023-04-07 ENCOUNTER — Encounter: Payer: Self-pay | Admitting: Physical Therapy

## 2023-04-07 ENCOUNTER — Ambulatory Visit (INDEPENDENT_AMBULATORY_CARE_PROVIDER_SITE_OTHER): Payer: Medicare Other | Admitting: Physical Therapy

## 2023-04-07 DIAGNOSIS — R262 Difficulty in walking, not elsewhere classified: Secondary | ICD-10-CM | POA: Diagnosis not present

## 2023-04-07 DIAGNOSIS — Z89612 Acquired absence of left leg above knee: Secondary | ICD-10-CM

## 2023-04-07 DIAGNOSIS — R2689 Other abnormalities of gait and mobility: Secondary | ICD-10-CM

## 2023-04-07 DIAGNOSIS — M6281 Muscle weakness (generalized): Secondary | ICD-10-CM | POA: Diagnosis not present

## 2023-04-07 DIAGNOSIS — R2681 Unsteadiness on feet: Secondary | ICD-10-CM

## 2023-04-07 NOTE — Therapy (Signed)
 OUTPATIENT PHYSICAL THERAPY PROSTHETIC TREATMENT   Patient Name: Tammy [name] MRN: [id number] DOB:[birth year], 68 y.o., female Today's Date: 04/07/2023    END OF SESSION:  PT End of Session - 04/07/23 1332     Visit Number 14    Number of Visits 27    Date for PT Re-Evaluation 04/22/23    Authorization Type [employer]    Authorization Time Period $20 COPAY 16 PT VISITS APPROVED 12/16-2/11/2023    Authorization - Visit Number 14    Authorization - Number of Visits 16    Progress Note Due on Visit 20    PT Start Time 1155   arrives late   PT Stop Time 1230    PT Time Calculation (min) 35 min    Equipment Utilized During Treatment Gait belt    Activity Tolerance Patient tolerated treatment well    Behavior During Therapy WFL for tasks assessed/performed               Past Medical History:  Diagnosis Date   Anemia    hx of   Anxiety    on meds   Arthritis    back/bilateral ankles   Bipolar disorder (HCC)    Breast cancer (HCC) 2022   LEFT breast-radiation   Depression    on meds   Family history of breast cancer 07/10/2020   Family history of colon cancer 07/10/2020   Family history of ovarian cancer 07/10/2020   GERD (gastroesophageal reflux disease)    on meds   Heart murmur    dx by PCP   Hepatitis C    HTN (hypertension)    on meds   Hx of radiation therapy    Liver cirrhosis (HCC)    from hep c   Neuromuscular disorder (HCC)    Osteopenia    Personal history of radiation therapy    Stroke (HCC) 09/2012   slurred speech and LEFT sided weakness/notes 10/04/2012   Past Surgical History:  Procedure Laterality Date   AMPUTATION Left 11/04/2022   Procedure: LEFT ABOVE KNEE AMPUTATION;  Surgeon: Nadara Mustard, MD;  Location: [facility] OR;  Service: Orthopedics;  Laterality: Left;   ANKLE FRACTURE SURGERY Left 2018   APPLICATION OF WOUND VAC Left 06/15/2022   Procedure: APPLICATION OF WOUND VAC;  Surgeon: Peggye Form, DO;  Location: [facility]  ORS;  Service: Plastics;  Laterality: Left;   APPLICATION OF WOUND VAC Left 09/14/2022   Procedure: APPLICATION OF WOUND VAC;  Surgeon: Peggye Form, DO;  Location: [facility] OR;  Service: Plastics;  Laterality: Left;   BREAST LUMPECTOMY Left 07/2020   BREAST LUMPECTOMY WITH RADIOACTIVE SEED AND SENTINEL LYMPH NODE BIOPSY Left 07/26/2020   Procedure: LEFT BREAST LUMPECTOMY WITH RADIOACTIVE SEED AND SENTINEL LYMPH NODE BIOPSY;  Surgeon: Abigail Miyamoto, MD;  Location: [facility];  Service: General;  Laterality: Left;   CESAREAN SECTION  1986   COLONOSCOPY     DIAGNOSTIC LAPAROSCOPY  1985   ESOPHAGOGASTRODUODENOSCOPY N/A 02/21/2023   Procedure: ESOPHAGOGASTRODUODENOSCOPY (EGD);  Surgeon: Hilarie Fredrickson, MD;  Location: [facility];  Service: Gastroenterology;  Laterality: N/A;   HARDWARE REMOVAL Left 04/30/2022   Procedure: HARDWARE REMOVAL LEFT FEMUR;  Surgeon: Samson Frederic, MD;  Location: [facility]S;  Service: Orthopedics;  Laterality: Left;  150   HARDWARE REMOVAL Left 11/04/2022   Procedure: HARDWARE REMOVAL INFECTED LEFT TOTAL KNEE ARTHROPLASTY;  Surgeon: Nadara Mustard, MD;  Location: [facility] OR;  Service: Orthopedics;  Laterality: Left;  I & D KNEE WITH POLY EXCHANGE Left 06/07/2022   Procedure: IRRIGATION AND DEBRIDEMENT KNEE WITH POLY EXCHANGE;  Surgeon: Samson Frederic, MD;  Location: [facility];  Service: Orthopedics;  Laterality: Left;   INCISION AND DRAINAGE OF WOUND Left 06/04/2022   Procedure: IRRIGATION AND DEBRIDEMENT KNEE;  Surgeon: Samson Frederic, MD;  Location: [facility];  Service: Orthopedics;  Laterality: Left;  60   INCISION AND DRAINAGE OF WOUND Left 06/15/2022   Procedure: excision of knee wound with Myriad;  Surgeon: Peggye Form, DO;  Location: [facility];  Service: Plastics;  Laterality: Left;   ORIF FEMUR FRACTURE Left 04/10/2021   Procedure: OPEN REDUCTION INTERNAL FIXATION (ORIF) DISTAL FEMUR FRACTURE;  Surgeon: Myrene Galas, MD;  Location: [facility] OR;   Service: Orthopedics;  Laterality: Left;   PARTIAL KNEE ARTHROPLASTY Left 04/30/2022   Procedure: DISTAL FEMUR REPLACEMENT;  Surgeon: Samson Frederic, MD;  Location: [facility];  Service: Orthopedics;  Laterality: Left;  150   Patient Active Problem List   Diagnosis Date Noted   Left femoral vein DVT (HCC) 02/20/2023   GERD (gastroesophageal reflux disease) 02/20/2023   Acute upper gastrointestinal bleeding 02/19/2023   Bipolar 1 disorder, depressed, partial remission (HCC) 11/18/2022   Left above-knee amputee (HCC) 11/13/2022   Infection of total knee replacement (HCC) 11/04/2022   S/P AKA (above knee amputation) unilateral, left (HCC) 11/04/2022   Chronic hepatitis C with cirrhosis (HCC) 06/06/2022   Surgical wound dehiscence, initial encounter 06/04/2022   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, left, with nonunion, subsequent encounter 04/30/2022   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 04/30/2022   Hypokalemia 04/16/2021   Hip fracture requiring operative repair (HCC) 04/16/2021   Vitamin D deficiency 04/12/2021   Hyponatremia 04/11/2021   Acute postoperative anemia due to expected blood loss 04/11/2021   Class 1 obesity 04/11/2021   Left comminuted and displaced fracture of the distal femur metaphysis 04/08/2021   History of CVA (cerebrovascular accident) 04/08/2021   Genetic testing 07/17/2020   Family history of breast cancer 07/10/2020   Family history of ovarian cancer 07/10/2020   Family history of colon cancer 07/10/2020   Malignant neoplasm of upper-outer quadrant of left breast in female, estrogen receptor positive (HCC) 07/09/2020   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   Chronic pain syndrome 10/06/2012   Nicotine dependence 10/06/2012   Anxiety state 10/05/2012   Bipolar disorder, unspecified (HCC) 10/05/2012   Slurred speech 10/04/2012   Left-sided weakness 10/04/2012   Hyperkalemia 10/04/2012    Acute CVA (cerebrovascular accident) (HCC) 10/04/2012   Essential hypertension     PCP: Quitman Livings, MD  REFERRING PROVIDER: Aldean Baker, MD  ONSET DATE: 01/25/2023 prosthesis delivery  REFERRING DIAG: U27.253 (ICD-10-CM) - Hx of AKA (above knee amputation), left   THERAPY DIAG:  Other abnormalities of gait and mobility  Unsteadiness on feet  Muscle weakness (generalized)  S/P AKA (above knee amputation) unilateral, left (HCC)  Difficulty in walking, not elsewhere classified  Rationale for Evaluation and Treatment: Rehabilitation  SUBJECTIVE:   SUBJECTIVE STATEMENT: Reports fall last week at home and landed on her right side, relays some pain and soreness in right side from that.    Pt accompanied by: [name], significant other  PERTINENT HISTORY: AKA, anemia, anxiety, arthritis, Bipolar disorder, Breast CA left with radiation, HTN, Hep C, liver cirrhosis, Neuromuscular disorder, osteopenia, stroke Left hemiparesis 2014  PAIN:  Are you having pain? Yes, 1/10 in left leg, 5/10 on Right side after  fall last week Pain location: residual limb Pain description: soreness Aggravating factors: standing or walking too much, or twisting motion Relieving factors: resting  PRECAUTIONS: Fall  WEIGHT BEARING RESTRICTIONS: No  FALLS: Has patient fallen in last 6 months? Yes. Number of falls 1 no injuries  LIVING ENVIRONMENT: Lives with: lives with their spouse Lives in: Mobile home Home Access: Ramped entrance or 4 steps with double rail to [employer] layout: One level Has following equipment at home: Single point cane, Environmental consultant - 2 wheeled, Wheelchair (manual), Shower bench, bed side commode, Grab bars, and Ramped entry  OCCUPATION: retired from nursing  PLOF: Independent prior to Fracture 04/10/2021.  Since fracture was able to get to cane for community until hardware broke.    PATIENT GOALS:  walk independently in community, travel, drive, take care of 4yo great  granddaughter  OBJECTIVE:  COGNITION: Overall cognitive status: Within functional limits for tasks assessed  POSTURE: rounded shoulders, forward head, flexed trunk , and weight shift right  LOWER EXTREMITY ROM:  ROM P:passive  A:active Right eval Left eval  Hip flexion    Hip extension    Hip abduction    Hip adduction    Hip internal rotation    Hip external rotation    Knee flexion    Knee extension    Ankle dorsiflexion    Ankle plantarflexion    Ankle inversion    Ankle eversion     (Blank rows = not tested)  LOWER EXTREMITY MMT:  MMT Right eval Left eval  Hip flexion    Hip extension    Hip abduction    Hip adduction    Hip internal rotation    Hip external rotation    Knee flexion    Knee extension    Ankle dorsiflexion    Ankle plantarflexion    Ankle inversion    Ankle eversion    At Evaluation all strength testing is grossly seated and functionally standing / gait. (Blank rows = not tested)  TRANSFERS: Evaluation on 01/25/2023:  Sit to stand: SBA w/c to RW requires armrests on w/c and stabilization with RW.   Stand to sit: SBA RW to w/c requires armrests on w/c and stabilization with RW.    FUNCTIONAL TESTs:  03/02/2023: Sharlene Motts Balance 22/56  Eval: Berg Balance Scale: 15/56   GAIT: 03/18/2023: -Patient ambulated 125ft and performing a 180deg turn CGA for patient safety with intermittent SBA  01/26/2023: Patient ambulated 10 feet with rolling walker with minA but required constant cueing for technique and safety.  PALPATION: 03/18/2023: patient verbalized seeing areas of discoloration on residual limb a few days prior. Therapist palpation and examination of area, did not see any noticeable areas with patient stating that the areas must have gone away without issue.    CURRENT PROSTHETIC WEAR ASSESSMENT: Evaluation on 01/25/2023:  Patient is dependent with: skin check, residual limb care, care of non-amputated limb, prosthetic cleaning, ply sock  cleaning, correct ply sock adjustment, proper wear schedule/adjustment, and proper weight-bearing schedule/adjustment Donning prosthesis: Min A Doffing prosthesis: SBA Prosthetic wear tolerance: tolerated wear 1 hour prior to PT evaluation.  Prosthetic weight bearing tolerance: 5 minutes with BUE support on RW with limited wear on prosthesis.  Edema: pitting Prosthetic description: Ischial Containment socket with flexible inner socket, silicon liner with velcro strap suspension.   TODAY'S TREATMENT:  DATE:  04/07/2023: Prosthetic Training with Transfemoral Prosthesis: Patient ambulated 60 feet with RW and CGA Ambulation then progressed to 4pt gait with lofstrand crutches, she needed max cues and demo for proper sequence. Performed 15 feet X 2 with min A for balance and wheelchair follow from fiance for safety.    TREATMENT:                                                                                                                             DATE:  03/30/2023: Therex: Nu step seat 9, level 3 for endurance, . Educated on increasing endurance with recumbent stepper or bike at [facility] so that she is able to walk into buildings instead of being pushed in wc. Pt verbalized general understanding.   While on nustep, educated on transport chair and its use, use as a rest point if ambulating longer distances at home, proper specs for ease of mobility and reduction of caretaker burden. Pt verbalized understanding.     Prosthetic Training with Transfemoral Prosthesis: Patient ambulated 59ft with RW SBA with step-to gait. PT verbal cues to facilitate step through pattern to facilitate prosthesis control.   STS with RW from 18in chair with arm rest SBA with patient demonstrating increased confidence and safety  Neuro: Standing balance with blue TB and RW support/RW in front of  patient if patient were to need it:  BUEs rows x10 ea unilateral/bilateral/alternating; CGA throughout with a few instances of patient requiring RW assist to regain balance.  UE punches at 90deg x10 ea unilateral with RW support then without UE support with patient demonstrating good weight shifting, Few instances of patient needing to regain balance with RW. CGA for safety; vc for proper foot placement for more secure balance  Unilateral UE shoulder extension Green TB with UE support X 10 reps with tactile cues for balance and verbal cues on technique.  Unilateral UE tricep extension green TB with UE support X 10 reps with tactile cues for balance and verbal cues on technique.     TREATMENT:                                                                                                                             DATE:  03/25/2023: Prosthetic Training with Transfemoral Prosthesis: PT educated pt on 4-point sequencing and pattern via visual aids to understand UE and LE placement when using forearm crutches. Patient ambulated with 4point  gait patttern 2' but deemed unsafe today.  Due to the height of the crutches on hand, ambulation was terminated as it was unsafe and not favorable to the patient to progress ambulation.   Patient able to ambulate with RW 79ftx2 CGA and reciprocal gait pattern. However when turning to sit in 18in chair patient lost balance requiring maxA at gait belt from therapist to prevent fall. Patient did not lock knee out before shifting weight causing the knee to buckle under the patient as they were turning with the RW.    Curb and ramp negotiation with RW CGA increased to MinA as patient demonstrated unsteadiness with ascending ramp due to not locking knee before weight acceptance; vc for proper sequencing    STS with RW 18in chair with no armrest; CGA for patient safety. Patient able to reach for seat and unlock prosthetic leg to lower down under control without  issue.  Neuro: Standing balance with green TB and RW support/RW in front of patient if patient were to need it:  UE punches at 90deg x10 ea unilateral with RW support then alternating BUE with patient demonstrating good weight shifting, 1 instance of MinA due to fatigue from punching. CGA for safety; vc for proper foot placement  BUEs rows x10 ea unilateral/bilateral/alternating; CGA throughout with a few instances of patient requiring RW assist to regain balance.   TREATMENT:                                                                                                                             DATE:  03/23/2023: Prosthetic Training with Transfemoral Prosthesis: -Patient ascended/descended 9 steps CGA with 2 instances of MinA due to fatigue. Patient negotiated use of BUE on bilateral railings for 4 steps and via side stepping BUEs on L railing for 5 step. Increased fatigue with patient requiring heavy vc for proper sequencing of LE during ascent. Required TC to ensure leg was fully locked out before fully accepting/transferring weight. SPT demo & verbal cues on technique.  -pt amb 50' X 2 with RW with minA/CGA with cues for upright posture and weight shift over prosthesis in stance.    Neuro: Standing balance with green TB UE flexion unilateral with RW support then BUEs with minA: 10 reps ea single UE and BUEs rows, forward reach and biceps curl/upward reach. All with RW directly in front of patient and holding on when doing unilateral activity. CGA throughout with a few instances of MinA due to fatigue and increased resistance with punching activity.    TREATMENT:  DATE:  03/18/2023: Prosthetic Training with Transfemoral Prosthesis: -Patient demonstrated independence with doffing prosthesis and set up assist to don prosthesis due to the thickness of the strap trying to  fit into the socket hole.  -Patient ambulated 160ft and performing a 180deg turn CGA for patient safety with intermittent SBA -Patient educated on products to reduce sweat within liner along with key points for recognition of when she is sweating in the liner. Patient and significant other verbalizing understanding. -Patient significant other educated on proper and safe wc follow when patient is ambulating with RW and use of gait belt for safety. Significant other verbalizing understanding.   HOME EXERCISE PROGRAM: Do each exercise 1-2  times per day Do each exercise 5-10 repetitions Hold each exercise for 2 seconds to feel your location  AT SINK FIND YOUR MIDLINE POSITION AND PLACE FEET EQUAL DISTANCE FROM THE MIDLINE.  Try to find this position when standing still for activities.   USE TAPE ON FLOOR TO MARK THE MIDLINE POSITION which is even with middle of sink.  You also should try to feel with your limb pressure in socket.  You are trying to feel with limb what you used to feel with the bottom of your foot.  Side to Side Shift: Moving your hips only (not shoulders): move weight onto your left leg, HOLD/FEEL pressure in socket.  Move back to equal weight on each leg, HOLD/FEEL pressure in socket. Move weight onto your right leg, HOLD/FEEL pressure in socket. Move back to equal weight on each leg, HOLD/FEEL pressure in socket. Repeat.  Start with both hands on sink, progress to hand on prosthetic side only, then no hands.  Front to Back Shift: Moving your hips only (not shoulders): move your weight forward onto your toes, HOLD/FEEL pressure in socket. Move your weight back to equal Flat Foot on both legs, HOLD/FEEL  pressure in socket. Move your weight back onto your heels, HOLD/FEEL  pressure in socket. Move your weight back to equal on both legs, HOLD/FEEL  pressure in socket. Repeat.  Start with both hands on sink, progress to hand on prosthetic side only, then no hands.  Moving Cones / Cups:  With equal weight on each leg: Hold on with one hand the first time, then progress to no hand supports. Move cups from one side of sink to the other. Place cups ~2" out of your reach, progress to 10" beyond reach.  Place one hand in middle of sink and reach with other hand. Do both arms.  Then hover one hand and move cups with other hand.  Overhead/Upward Reaching: alternated reaching up to top cabinets or ceiling if no cabinets present. Keep equal weight on each leg. Start with one hand support on counter while other hand reaches and progress to no hand support with reaching.  ace one hand in middle of sink and reach with other hand. Do both arms.  Then hover one hand and move cups with other hand.  5.   Looking Over Shoulders: With equal weight on each leg: alternate turning to look over your shoulders with one hand support on counter as needed.  Start with head motions only to look in front of shoulder, then even with shoulder and progress to looking behind you. To look to side, move head /eyes, then shoulder on side looking pulls back, shift more weight to side looking and pull hip back. Place one hand in middle of sink and let go with other hand so your shoulder  can pull back. Switch hands to look other way.   Then hover one hand and look over shoulder. If looking right, use left hand at sink. If looking left, use right hand at sink. 6.  Stepping with leg that is not amputated:  Move items under cabinet out of your way. Shift your hips/pelvis so weight on prosthesis. Tighten muscles in hip on prosthetic side.  SLOWLY step other leg so front of foot is in cabinet. Then step back to floor.    ASSESSMENT:  CLINICAL IMPRESSION: Session focused on prosthetic gait and she showed improvement with step through pattern using RW.  We then introduced forearm crutches today for 4 pt gait pattern but does have increased difficulty with balance and sequencing of this so she will need more practice with this.    OBJECTIVE IMPAIRMENTS: Abnormal gait, decreased activity tolerance, decreased balance, decreased endurance, decreased knowledge of condition, decreased knowledge of use of DME, decreased mobility, difficulty walking, decreased ROM, decreased strength, increased edema, impaired flexibility, postural dysfunction, prosthetic dependency , obesity, and pain.   ACTIVITY LIMITATIONS: carrying, lifting, bending, sitting, standing, stairs, transfers, and locomotion level  PARTICIPATION LIMITATIONS: meal prep, cleaning, and community activity  PERSONAL FACTORS: Fitness, Time since onset of injury/illness/exacerbation, and 3+ comorbidities: see PMH  are also affecting patient's functional outcome.   REHAB POTENTIAL: Good  CLINICAL DECISION MAKING: Evolving/moderate complexity  EVALUATION COMPLEXITY: Moderate   GOALS: Goals reviewed with patient? Yes  SHORT TERM GOALS: Target date: 02/25/2023  Patient donnes prosthesis modified independent & verbalizes proper cleaning. Baseline: SEE OBJECTIVE DATA Goal status: MET 02/25/2023 2.  Patient tolerates prosthesis >10 hrs total /day without skin issues or limb pain after standing. Baseline: SEE OBJECTIVE DATA Goal status: MET 02/25/2023  3.  Berg Balance >20/56 Baseline: SEE OBJECTIVE DATA Goal status: MET 03/02/2023  4. Patient ambulates 43' with RW & prosthesis with supervision. Baseline: SEE OBJECTIVE DATA Goal status: MET 03/23/2023  5. Patient negotiates ramps & curbs with RW & prosthesis with minA. Baseline: SEE OBJECTIVE DATA Goal status: MET  03/25/2023  LONG TERM GOALS: Target date: 04/22/2023  Patient demonstrates & verbalized understanding of prosthetic care to enable safe utilization of prosthesis. Baseline: SEE OBJECTIVE DATA Goal status: Ongoing   03/23/2023  Patient tolerates prosthesis wear >90% of awake hours without skin or residual limb pain issues. Baseline: SEE OBJECTIVE DATA Goal status: Ongoing   03/23/2023  Berg  Balance >35/56 to indicate lower fall risk Baseline: SEE OBJECTIVE DATA Goal status: Ongoing   03/23/2023  Patient ambulates >300' with prosthesis and cane or less modified independent. Baseline: SEE OBJECTIVE DATA Goal status: Ongoing   03/23/2023  Patient negotiates ramps, curbs & stairs with single rail with prosthesis  and cane or less modified independent. Baseline: SEE OBJECTIVE DATA Goal status: Ongoing    03/23/2023   Patient reports right knee pain </= 2/10 with standing and gait activities.   Baseline: SEE OBJECTIVE DATA Goal status:   Ongoing    03/23/2023  PLAN:  PT FREQUENCY:  3 x for 1 week, then 2x/week  PT DURATION: 90 days / 13 weeks  PLANNED INTERVENTIONS: 97164- PT Re-evaluation, 97110-Therapeutic exercises, 97530- Therapeutic activity, 97112- Neuromuscular re-education, [phone]- Self Care, 60454- Gait training, [phone]- Prosthetic training, Patient/Family education, Balance training, Stair training, DME instructions, and physical performance testing  PLAN FOR NEXT SESSION:  ambulation with step through gait pattern, trial 4 point forearm ambulation if able, continue or progress resisted balance activities   April Manson, PT, DPT  04/07/2023, 1:35 PM   Date of referral: 12/22/2022 Referring provider: Aldean Baker, MD Referring diagnosis? V40.981 (ICD-10-CM) - Hx of AKA (above knee amputation), left  Treatment diagnosis? (if different than referring diagnosis)  Other abnormalities of gait and mobility  ICD-10-CM: R26.89    Unsteadiness on feet  ICD-10-CM: R26.81    Muscle weakness (generalized)  ICD-10-CM: M62.81    Chronic pain of right knee  M25.561 & G89.29   History of falling Z91.81   What was this (referring dx) caused by? Surgery (Type: AKA)   Nature of Condition: Initial Onset (within last 3 months) for prosthesis delivery              Laterality: Lt   Current Functional Measure Score: Other 03/02/2023: Sharlene Motts Balance 22/56  at eval Berg was 15/56    Objective measurements identify impairments when they are compared to normal values, the uninvolved extremity, and prior level of function.             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  She has improved wear & care with PT education.     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     Average pain intensity:             Last 24 hours: 1-2/10             Past week: 1-2/10   How often does the pt experience symptoms? Constantly   How much have the symptoms interfered with usual daily activities? Extremely   How has condition changed since care began at this facility? Better   In general, how is the patients overall health? Very Good     BACK PAIN (STarT Back Screening Tool) No

## 2023-04-08 ENCOUNTER — Encounter: Payer: Self-pay | Admitting: Physical Medicine & Rehabilitation

## 2023-04-08 ENCOUNTER — Encounter: Payer: Medicare Other | Attending: Physical Medicine & Rehabilitation | Admitting: Physical Medicine & Rehabilitation

## 2023-04-08 VITALS — BP 185/84 | HR 59 | Ht 62.0 in | Wt 153.0 lb

## 2023-04-08 DIAGNOSIS — F319 Bipolar disorder, unspecified: Secondary | ICD-10-CM

## 2023-04-08 DIAGNOSIS — R52 Pain, unspecified: Secondary | ICD-10-CM | POA: Diagnosis present

## 2023-04-08 DIAGNOSIS — I10 Essential (primary) hypertension: Secondary | ICD-10-CM | POA: Diagnosis present

## 2023-04-08 DIAGNOSIS — Z89612 Acquired absence of left leg above knee: Secondary | ICD-10-CM | POA: Diagnosis present

## 2023-04-08 DIAGNOSIS — G548 Other nerve root and plexus disorders: Secondary | ICD-10-CM | POA: Diagnosis present

## 2023-04-08 MED ORDER — DULOXETINE HCL 20 MG PO CPEP: 20.0000 mg | ORAL_CAPSULE | Freq: Every day | ORAL | 2 refills | Status: DC

## 2023-04-08 NOTE — Progress Notes (Addendum)
 Subjective:    Patient ID: Tammy [name], female    DOB: 05-11-1955, 68 y.o.   MRN: [id number]  HPI  Hospital discharge summary 11/23/2022    Brief HPI:   Tammy [name] is a 68 y.o. female who underwent open reduction of her distal femur fracture April 10, 2021. Patient eventually had the hardware removed in March 2024 with irrigation and debridement with Dr. Charol Copas 3 separate times as well as distal femur replacement with total knee arthroplasty. Patient has undergone 2 wound debridements and application of myriad with Dr. Orin Birk on May 6 and August 5. She continued to have purulent drainage in the wound vac; and the wound did not heal with significant pain in the infected area. Skin graft trial previously from plastic surgery failed as well. The hardware remained exposed. She underwent left AKA by Dr. Julio Ohm on 11/04/2022. Wound VAC removed 10/02. The amputation site wound cultures remain negative. She will continue on vancomycin and ceftriaxone through 11/05 and follow with ID. Tolerating diet. She continues to progress ambulation in the hallway with RW CGA/Min A.      Hospital Course: Tammy [name] was admitted to rehab 11/13/2022 for inpatient therapies to consist of PT, ST and OT at least three hours five days a week. Past admission physiatrist, therapy team and rehab RN have worked together to provide customized collaborative inpatient rehab. Phantom pain addressed with increase in gabapentin dosing. On 10/05, was notifed by vascular lab that dopplers are positive for DVT involving the femoral vein but not the deep or common femoral veins. Treatment dose Lovenox started. Pharmacy consult placed. Continue bedrest through 10/06. Increased K-dur from 10 to 20 mEq on 10/07. Neuropsychology consultation on 10/09. Tolerating shrinker sock better. Oral iron continued and hemoglobin showed slow improvement. No results entered for hemoccult stool as ordered on admission.  No evidence of bleeding or blood loss. Follow-up labs with normal potassium level. Will continue 10 mEq daily at discharge. Convert from Lovenox to Eliquis for DVT treatment. Hemoglobin continues to improve. Continue prenatal home vitamin which contains iron.    Blood pressures were monitored on TID basis and amlodipine 10 mg daily and hydralazine 25 mg BID continued. 10/8 Increase hydralazine to 25 mg 3 times daily. Increased further to 37.2 mg TID on 10/10. Increased again on 10/11 to 50 mg TID. Increased again on 10/13 to 75 mg TID.     Interval history 11/27/2022 Patient is here to follow-up after completing debilitation at [facility]. Patient reports she is doing well overall since her discharge.  She is having difficulty with phantom pain during the day and particularly at night.  She continues to use oxycodone for residual limb pain also, she reports she would like to gradually wean off of this over time.  She reports visits with Dr. Julio Ohm and Dr. Ruffus Couch scheduled next week.  Mood is doing okay on Wellbutrin.  Interval history 12/25/22 Patient reports he is doing well overall.  [name]  She reports she was sent to [facility] for prosthetic fitting by Dr. Erven Heater.  She continues to have poorly controlled phantom pain.  Pain did improve with increased dose of gabapentin to 800 mg 3 times daily, but still not overall controlled.  Interval history 04/08/2023 Patient reports she is doing well overall.  Had a few falls previously, due to the poor footwear that caused her to lose traction.  She reports last time she fell on her  right side, denies serious injury.  Mild right upper extremity soreness that is improving. She reports her phantom pain is overall much improved since last visit and is more mild.  She did not tolerate Lyrica well due to sedation.  It also did not help her pain as well as the gabapentin.  She is back on gabapentin but at a much lower dose  only at night.  Reports her mood can be down, more irritable at times.  Pain Inventory Average Pain 7 Pain Right Now 2 My pain is intermittent, sharp, burning, and stabbing  In the last 24 hours, has pain interfered with the following? General activity 7 Relation with others 4 Enjoyment of life 4  What TIME of day is your pain at its worst? night Sleep (in general) Poor  Pain is worse with: some activites Pain improves with: medication and rubbing the area, medication Relief from Meds: 5      Family History  Problem Relation Age of Onset   Breast cancer Mother    Other Mother        blood clots   Diabetes Sister    Neuropathy Sister        diabetic neuropathy   Breast cancer Maternal Aunt        17   Colon cancer Maternal Aunt 16   Diabetes Paternal Aunt    Diabetes Brother    Neuropathy Brother        diabetic neuropathy   Breast cancer Other        PGM's sister; dx mid 77s   Colon cancer Other        PGM's brother; dx 47s   Ovarian cancer Other        PGM's mother; dx unknown age   Esophageal cancer Neg Hx    Rectal cancer Neg Hx    Stomach cancer Neg Hx    Colon polyps Neg Hx    Social History   Socioeconomic History   Marital status: Significant Other    Spouse name: Not on file   Number of children: 2   Years of education: Not on file   Highest education level: Not on file  Occupational History   Occupation: diabled  Tobacco Use   Smoking status: Former    Current packs/day: 1.50    Average packs/day: 1.5 packs/day for 40.0 years (60.0 ttl pk-yrs)    Types: Cigarettes   Smokeless tobacco: Never   Tobacco comments:    has used e-cigarettes, no vaping   Vaping Use   Vaping status: Never Used  Substance and Sexual Activity   Alcohol use: Not Currently   Drug use: No   Sexual activity: Not Currently  Other Topics Concern   Not on file  Social History Narrative   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   Caffeine: 1 cup in  the morning    Social Drivers of Health   Financial Resource Strain: Not on file  Food Insecurity: No Food Insecurity (02/19/2023)   Hunger Vital Sign    Worried About Running Out of Food in the Last Year: Never true    Ran Out of Food in the Last Year: Never true  Transportation Needs: No Transportation Needs (02/19/2023)   PRAPARE - Administrator, Civil Service (Medical): No    Lack of Transportation (Non-Medical): No  Physical Activity: Not on file  Stress: Not on file  Social Connections: Socially Integrated (02/19/2023)  Social Connection and Isolation Panel [NHANES]    Frequency of Communication with Friends and Family: More than three times a week    Frequency of Social Gatherings with Friends and Family: Three times a week    Attends Religious Services: More than 4 times per year    Active Member of Clubs or Organizations: Yes    Attends Banker Meetings: More than 4 times per year    Marital Status: Living with partner   Past Surgical History:  Procedure Laterality Date   AMPUTATION Left 11/04/2022   Procedure: LEFT ABOVE KNEE AMPUTATION;  Surgeon: Timothy Ford, MD;  Location: [facility] OR;  Service: Orthopedics;  Laterality: Left;   ANKLE FRACTURE SURGERY Left 2018   APPLICATION OF WOUND VAC Left 06/15/2022   Procedure: APPLICATION OF WOUND VAC;  Surgeon: Thornell Flirt, DO;  Location: [facility];  Service: Plastics;  Laterality: Left;   APPLICATION OF WOUND VAC Left 09/14/2022   Procedure: APPLICATION OF WOUND VAC;  Surgeon: Thornell Flirt, DO;  Location: [facility] OR;  Service: Plastics;  Laterality: Left;   BREAST LUMPECTOMY Left 07/2020   BREAST LUMPECTOMY WITH RADIOACTIVE SEED AND SENTINEL LYMPH NODE BIOPSY Left 07/26/2020   Procedure: LEFT BREAST LUMPECTOMY WITH RADIOACTIVE SEED AND SENTINEL LYMPH NODE BIOPSY;  Surgeon: Oza Blumenthal, MD;  Location: [facility];  Service: General;  Laterality: Left;   CESAREAN SECTION  1986    COLONOSCOPY     DIAGNOSTIC LAPAROSCOPY  1985   ESOPHAGOGASTRODUODENOSCOPY N/A 02/21/2023   Procedure: ESOPHAGOGASTRODUODENOSCOPY (EGD);  Surgeon: Tobin Forts, MD;  Location: [facility];  Service: Gastroenterology;  Laterality: N/A;   HARDWARE REMOVAL Left 04/30/2022   Procedure: HARDWARE REMOVAL LEFT FEMUR;  Surgeon: Adonica Hoose, MD;  Location: [facility];  Service: Orthopedics;  Laterality: Left;  150   HARDWARE REMOVAL Left 11/04/2022   Procedure: HARDWARE REMOVAL INFECTED LEFT TOTAL KNEE ARTHROPLASTY;  Surgeon: Timothy Ford, MD;  Location: [facility] OR;  Service: Orthopedics;  Laterality: Left;   I & D KNEE WITH POLY EXCHANGE Left 06/07/2022   Procedure: IRRIGATION AND DEBRIDEMENT KNEE WITH POLY EXCHANGE;  Surgeon: Adonica Hoose, MD;  Location: [facility];  Service: Orthopedics;  Laterality: Left;   INCISION AND DRAINAGE OF WOUND Left 06/04/2022   Procedure: IRRIGATION AND DEBRIDEMENT KNEE;  Surgeon: Adonica Hoose, MD;  Location: [facility];  Service: Orthopedics;  Laterality: Left;  60   INCISION AND DRAINAGE OF WOUND Left 06/15/2022   Procedure: excision of knee wound with Myriad;  Surgeon: Thornell Flirt, DO;  Location: [facility];  Service: Plastics;  Laterality: Left;   ORIF FEMUR FRACTURE Left 04/10/2021   Procedure: OPEN REDUCTION INTERNAL FIXATION (ORIF) DISTAL FEMUR FRACTURE;  Surgeon: Hardy Lia, MD;  Location: [facility];  Service: Orthopedics;  Laterality: Left;   PARTIAL KNEE ARTHROPLASTY Left 04/30/2022   Procedure: DISTAL FEMUR REPLACEMENT;  Surgeon: Adonica Hoose, MD;  Location: [facility];  Service: Orthopedics;  Laterality: Left;  150   Past Medical History:  Diagnosis Date   Anemia    hx of   Anxiety    on meds   Arthritis    back/bilateral ankles   Bipolar disorder (HCC)    Breast cancer (HCC) 2022   LEFT breast-radiation   Depression    on meds   Family history of breast cancer 07/10/2020   Family history of colon cancer 07/10/2020   Family history of ovarian cancer  07/10/2020   GERD (gastroesophageal reflux disease)  on meds   Heart murmur    dx by PCP   Hepatitis C    HTN (hypertension)    on meds   Hx of radiation therapy    Liver cirrhosis (HCC)    from hep c   Neuromuscular disorder (HCC)    Osteopenia    Personal history of radiation therapy    Stroke (HCC) 09/2012   slurred speech and LEFT sided weakness/notes 10/04/2012   There were no vitals taken for this visit.  Opioid Risk Score:   Fall Risk Score:  `1  Depression screen [facility] 2/9     12/25/2022    9:36 AM 11/27/2022    2:48 PM 11/03/2022    3:04 PM  Depression screen PHQ 2/9  Decreased Interest 1 0 0  Down, Depressed, Hopeless 1 0 0  PHQ - 2 Score 2 0 0  Altered sleeping  3   Tired, decreased energy  0   Change in appetite  0   Feeling bad or failure about yourself   0   Trouble concentrating  0   Moving slowly or fidgety/restless  0   Suicidal thoughts  0   PHQ-9 Score  3     Review of Systems  Musculoskeletal:  Positive for gait problem.  Neurological:  Positive for weakness and numbness.       Tingling  Psychiatric/Behavioral:         Anxiety  All other systems reviewed and are negative.      Objective:   Physical Exam   Gen: no distress, normal appearing HEENT: oral mucosa pink and moist, NCAT Cardio: Reg rate Chest: normal effort, normal rate of breathing Abd: soft, non-distended Ext: L AKA Psych: pleasant, normal affect Skin: L AKA Neuro: alert and awake, follows commands, CN grossly intact Strength 5/5 b/l UE, RLE 5/5 Left lower extremity, able to lift to gravity Wearing temporary prosthetic device Sensory exam normal for LT  Musculoskeletal: No joint swelling or tendereness noted      Assessment & Plan:    1. 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  Left femoral  DVT -Previously on Eliquis, follow-up with PCP   3. Pain Management:              -Lyrica was discontinued by different provider since last visit because she did not tolerated and she was restarted on gabapentin at a lower dose of 300 mg at bedtime.  Phantom pain continues overall doing better.  Will try Cymbalta low-dose 20 mg daily   4. History of bipolar,no SI or HI  -She is currently on Wellbutrin 150 mg every 12 hours  -Patient reports she was previously on Cymbalta, tolerated it well in the past   5. Hypokalemia   -Appears improved on January labs, follow-up PCP   6: Hypertension   -Well controlled today             02/21/2023    3:00 PM 02/21/2023    1:00 PM 02/21/2023   12:00 PM  Vitals with BMI  Systolic 134 136 098  Diastolic 66 65 81  Pulse 90 96 92    7: Hardware associated osteomyelitis             -Continue to follow-up with ID as directed  05/24/23 pt requested cymbalta increase,  increase to 30mg  daily

## 2023-04-12 ENCOUNTER — Encounter: Payer: Self-pay | Admitting: Physical Therapy

## 2023-04-12 ENCOUNTER — Ambulatory Visit (INDEPENDENT_AMBULATORY_CARE_PROVIDER_SITE_OTHER): Payer: Medicare Other | Admitting: Physical Therapy

## 2023-04-12 DIAGNOSIS — M6281 Muscle weakness (generalized): Secondary | ICD-10-CM

## 2023-04-12 DIAGNOSIS — R2689 Other abnormalities of gait and mobility: Secondary | ICD-10-CM

## 2023-04-12 DIAGNOSIS — Z89612 Acquired absence of left leg above knee: Secondary | ICD-10-CM | POA: Diagnosis not present

## 2023-04-12 DIAGNOSIS — R262 Difficulty in walking, not elsewhere classified: Secondary | ICD-10-CM

## 2023-04-12 DIAGNOSIS — Z9181 History of falling: Secondary | ICD-10-CM

## 2023-04-12 DIAGNOSIS — R2681 Unsteadiness on feet: Secondary | ICD-10-CM

## 2023-04-12 NOTE — Therapy (Addendum)
 OUTPATIENT PHYSICAL THERAPY PROSTHETIC TREATMENT   Patient Name: Tammy [name] MRN: [id number] DOB:02/14/1955, 68 y.o., female Today's Date: 04/12/2023    END OF SESSION:  PT End of Session - 04/12/23 0935     Visit Number 15    Number of Visits 27    Date for PT Re-Evaluation 04/22/23    Authorization Type [employer] Medicare    Authorization Time Period $20 COPAY 8 PT VISITS APPROVED 03/23/2023-04/20/2023    Authorization - Visit Number 5    Authorization - Number of Visits 8    Progress Note Due on Visit 20    PT Start Time 0932    PT Stop Time 1018    PT Time Calculation (min) 46 min    Equipment Utilized During Treatment Gait belt    Activity Tolerance Patient tolerated treatment well    Behavior During Therapy WFL for tasks assessed/performed                Past Medical History:  Diagnosis Date   Anemia    hx of   Anxiety    on meds   Arthritis    back/bilateral ankles   Bipolar disorder (HCC)    Breast cancer (HCC) 2022   LEFT breast-radiation   Depression    on meds   Family history of breast cancer 07/10/2020   Family history of colon cancer 07/10/2020   Family history of ovarian cancer 07/10/2020   GERD (gastroesophageal reflux disease)    on meds   Heart murmur    dx by PCP   Hepatitis C    HTN (hypertension)    on meds   Hx of radiation therapy    Liver cirrhosis (HCC)    from hep c   Neuromuscular disorder (HCC)    Osteopenia    Personal history of radiation therapy    Stroke (HCC) 09/2012   slurred speech and LEFT sided weakness/notes 10/04/2012   Past Surgical History:  Procedure Laterality Date   AMPUTATION Left 11/04/2022   Procedure: LEFT ABOVE KNEE AMPUTATION;  Surgeon: Nadara Mustard, MD;  Location: [facility] OR;  Service: Orthopedics;  Laterality: Left;   ANKLE FRACTURE SURGERY Left 2018   APPLICATION OF WOUND VAC Left 06/15/2022   Procedure: APPLICATION OF WOUND VAC;  Surgeon: Peggye Form, DO;  Location: [facility];   Service: Plastics;  Laterality: Left;   APPLICATION OF WOUND VAC Left 09/14/2022   Procedure: APPLICATION OF WOUND VAC;  Surgeon: Peggye Form, DO;  Location: [facility] OR;  Service: Plastics;  Laterality: Left;   BREAST LUMPECTOMY Left 07/2020   BREAST LUMPECTOMY WITH RADIOACTIVE SEED AND SENTINEL LYMPH NODE BIOPSY Left 07/26/2020   Procedure: LEFT BREAST LUMPECTOMY WITH RADIOACTIVE SEED AND SENTINEL LYMPH NODE BIOPSY;  Surgeon: Abigail Miyamoto, MD;  Location: [facility];  Service: General;  Laterality: Left;   CESAREAN SECTION  1986   COLONOSCOPY     DIAGNOSTIC LAPAROSCOPY  1985   ESOPHAGOGASTRODUODENOSCOPY N/A 02/21/2023   Procedure: ESOPHAGOGASTRODUODENOSCOPY (EGD);  Surgeon: Hilarie Fredrickson, MD;  Location: [facility];  Service: Gastroenterology;  Laterality: N/A;   HARDWARE REMOVAL Left 04/30/2022   Procedure: HARDWARE REMOVAL LEFT FEMUR;  Surgeon: Samson Frederic, MD;  Location: [facility];  Service: Orthopedics;  Laterality: Left;  150   HARDWARE REMOVAL Left 11/04/2022   Procedure: HARDWARE REMOVAL INFECTED LEFT TOTAL KNEE ARTHROPLASTY;  Surgeon: Nadara Mustard, MD;  Location: [facility] OR;  Service: Orthopedics;  Laterality: Left;   I &  D KNEE WITH POLY EXCHANGE Left 06/07/2022   Procedure: IRRIGATION AND DEBRIDEMENT KNEE WITH POLY EXCHANGE;  Surgeon: Samson Frederic, MD;  Location: [facility];  Service: Orthopedics;  Laterality: Left;   INCISION AND DRAINAGE OF WOUND Left 06/04/2022   Procedure: IRRIGATION AND DEBRIDEMENT KNEE;  Surgeon: Samson Frederic, MD;  Location: [facility];  Service: Orthopedics;  Laterality: Left;  60   INCISION AND DRAINAGE OF WOUND Left 06/15/2022   Procedure: excision of knee wound with Myriad;  Surgeon: Peggye Form, DO;  Location: [facility];  Service: Plastics;  Laterality: Left;   ORIF FEMUR FRACTURE Left 04/10/2021   Procedure: OPEN REDUCTION INTERNAL FIXATION (ORIF) DISTAL FEMUR FRACTURE;  Surgeon: Myrene Galas, MD;  Location: [facility];  Service:  Orthopedics;  Laterality: Left;   PARTIAL KNEE ARTHROPLASTY Left 04/30/2022   Procedure: DISTAL FEMUR REPLACEMENT;  Surgeon: Samson Frederic, MD;  Location: [facility];  Service: Orthopedics;  Laterality: Left;  150   Patient Active Problem List   Diagnosis Date Noted   Left femoral vein DVT (HCC) 02/20/2023   GERD (gastroesophageal reflux disease) 02/20/2023   Acute upper gastrointestinal bleeding 02/19/2023   Bipolar 1 disorder, depressed, partial remission (HCC) 11/18/2022   Left above-knee amputee (HCC) 11/13/2022   Infection of total knee replacement (HCC) 11/04/2022   S/P AKA (above knee amputation) unilateral, left (HCC) 11/04/2022   Chronic hepatitis C with cirrhosis (HCC) 06/06/2022   Surgical wound dehiscence, initial encounter 06/04/2022   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, left, with nonunion, subsequent encounter 04/30/2022   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 04/30/2022   Hypokalemia 04/16/2021   Hip fracture requiring operative repair (HCC) 04/16/2021   Vitamin D deficiency 04/12/2021   Hyponatremia 04/11/2021   Acute postoperative anemia due to expected blood loss 04/11/2021   Class 1 obesity 04/11/2021   Left comminuted and displaced fracture of the distal femur metaphysis 04/08/2021   History of CVA (cerebrovascular accident) 04/08/2021   Genetic testing 07/17/2020   Family history of breast cancer 07/10/2020   Family history of ovarian cancer 07/10/2020   Family history of colon cancer 07/10/2020   Malignant neoplasm of upper-outer quadrant of left breast in female, estrogen receptor positive (HCC) 07/09/2020   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   Chronic pain syndrome 10/06/2012   Nicotine dependence 10/06/2012   Anxiety state 10/05/2012   Bipolar disorder, unspecified (HCC) 10/05/2012   Slurred speech 10/04/2012   Left-sided weakness 10/04/2012   Hyperkalemia 10/04/2012   Acute  CVA (cerebrovascular accident) (HCC) 10/04/2012   Essential hypertension     PCP: Quitman Livings, MD  REFERRING PROVIDER: Aldean Baker, MD  ONSET DATE: 01/25/2023 prosthesis delivery  REFERRING DIAG: W09.811 (ICD-10-CM) - Hx of AKA (above knee amputation), left   THERAPY DIAG:  Other abnormalities of gait and mobility  Unsteadiness on feet  Muscle weakness (generalized)  S/P AKA (above knee amputation) unilateral, left (HCC)  Difficulty in walking, not elsewhere classified  History of falling  Rationale for Evaluation and Treatment: Rehabilitation  SUBJECTIVE:   SUBJECTIVE STATEMENT: Patient in good spirits today and is able to describe how she fell the other day- not having shoe on RLE, prosthetic leg was on, seemed to just be off balance from leg length, significant other [name] was able to pick her up from the floor.   Pt accompanied by: [name], significant other  PERTINENT HISTORY: AKA, anemia, anxiety, arthritis, Bipolar disorder, Breast CA left with radiation, HTN, Hep C, liver  cirrhosis, Neuromuscular disorder, osteopenia, stroke Left hemiparesis 2014  PAIN:  Are you having pain? Yes, 1/10 in left leg, 5/10 on Right side after fall last week Pain location: residual limb Pain description: soreness Aggravating factors: standing or walking too much, or twisting motion Relieving factors: resting  PRECAUTIONS: Fall  WEIGHT BEARING RESTRICTIONS: No  FALLS: Has patient fallen in last 6 months? Yes. Number of falls 1 no injuries  LIVING ENVIRONMENT: Lives with: lives with their spouse Lives in: Mobile home Home Access: Ramped entrance or 4 steps with double rail to [employer] layout: One level Has following equipment at home: Single point cane, Environmental consultant - 2 wheeled, Wheelchair (manual), Shower bench, bed side commode, Grab bars, and Ramped entry  OCCUPATION: retired from nursing  PLOF: Independent prior to Fracture 04/10/2021.  Since fracture was able to get to cane  for community until hardware broke.    PATIENT GOALS:  walk independently in community, travel, drive, take care of 4yo great granddaughter  OBJECTIVE:  COGNITION: Overall cognitive status: Within functional limits for tasks assessed  POSTURE: rounded shoulders, forward head, flexed trunk , and weight shift right  LOWER EXTREMITY ROM:  ROM P:passive  A:active Right eval Left eval  Hip flexion    Hip extension    Hip abduction    Hip adduction    Hip internal rotation    Hip external rotation    Knee flexion    Knee extension    Ankle dorsiflexion    Ankle plantarflexion    Ankle inversion    Ankle eversion     (Blank rows = not tested)  LOWER EXTREMITY MMT:  MMT Right eval Left eval  Hip flexion    Hip extension    Hip abduction    Hip adduction    Hip internal rotation    Hip external rotation    Knee flexion    Knee extension    Ankle dorsiflexion    Ankle plantarflexion    Ankle inversion    Ankle eversion    At Evaluation all strength testing is grossly seated and functionally standing / gait. (Blank rows = not tested)  TRANSFERS: Evaluation on 01/25/2023:  Sit to stand: SBA w/c to RW requires armrests on w/c and stabilization with RW.   Stand to sit: SBA RW to w/c requires armrests on w/c and stabilization with RW.    FUNCTIONAL TESTs:  03/02/2023: Sharlene Motts Balance 22/56  Eval: Berg Balance Scale: 15/56   GAIT: 03/18/2023: -Patient ambulated 181ft and performing a 180deg turn CGA for patient safety with intermittent SBA  01/26/2023: Patient ambulated 10 feet with rolling walker with minA but required constant cueing for technique and safety.  PALPATION: 03/18/2023: patient verbalized seeing areas of discoloration on residual limb a few days prior. Therapist palpation and examination of area, did not see any noticeable areas with patient stating that the areas must have gone away without issue.    CURRENT PROSTHETIC WEAR ASSESSMENT: Evaluation on  01/25/2023:  Patient is dependent with: skin check, residual limb care, care of non-amputated limb, prosthetic cleaning, ply sock cleaning, correct ply sock adjustment, proper wear schedule/adjustment, and proper weight-bearing schedule/adjustment Donning prosthesis: Min A Doffing prosthesis: SBA Prosthetic wear tolerance: tolerated wear 1 hour prior to PT evaluation.  Prosthetic weight bearing tolerance: 5 minutes with BUE support on RW with limited wear on prosthesis.  Edema: pitting Prosthetic description: Ischial Containment socket with flexible inner socket, silicon liner with velcro strap suspension.   TODAY'S TREATMENT:  DATE:  04/12/2023: Prosthetic Training with Transfemoral Prosthesis: Ambulated 20' & 32' with RW CGA to SBA as patient demonstrated increased safety.   Therapeutic Activities: Patient was educated with demo & verbal cues on floor transfer with safety for instances of falls via demo and walk through of sequencing. Patient performed transfer with BUEs on 18" chair seat to half kneeling with PT maxA.  Pt had limitation WB on prosthetic knee due left hip ext ROM and weakness in BLEs.    Neuromuscular Re-education working on posture and balance: Hip flexor stretch supine hooklying 2x30sec hold Hip flexor stretch supine with knee to chest 2x30sec hold; vc for sequencing and maintaining knee to chest before lowering LLE Seated hamstring stretch 2x30sec; vc for proper positioning Standing counter stretch 2x30sec; vc and education for proper posture Upright stance at doorway x2 ea UE with 2 breaths then BUE x2 breaths with RW in front of patient for safety; vc for sequencing to achieve LE positioning before backing up to door frame  Patient educated on updated HEP verbalizing understanding   TREATMENT:                                                                                                                              DATE:  04/07/2023: Prosthetic Training with Transfemoral Prosthesis: Patient ambulated 60 feet with RW and CGA Ambulation then progressed to 4pt gait with lofstrand crutches, she needed max cues and demo for proper sequence. Performed 15 feet X 2 with min A for balance and wheelchair follow from fiance for safety.    TREATMENT:                                                                                                                             DATE:  03/30/2023: Therex: Nu step seat 9, level 3 for endurance, . Educated on increasing endurance with recumbent stepper or bike at [facility] so that she is able to walk into buildings instead of being pushed in wc. Pt verbalized general understanding.   While on nustep, educated on transport chair and its use, use as a rest point if ambulating longer distances at home, proper specs for ease of mobility and reduction of caretaker burden. Pt verbalized understanding.     Prosthetic Training with Transfemoral Prosthesis: Patient ambulated 4ft with RW SBA with step-to gait. PT verbal cues to facilitate step through pattern to facilitate prosthesis control.  STS with RW from 18in chair with arm rest SBA with patient demonstrating increased confidence and safety  Neuro: Standing balance with blue TB and RW support/RW in front of patient if patient were to need it:  BUEs rows x10 ea unilateral/bilateral/alternating; CGA throughout with a few instances of patient requiring RW assist to regain balance.  UE punches at 90deg x10 ea unilateral with RW support then without UE support with patient demonstrating good weight shifting, Few instances of patient needing to regain balance with RW. CGA for safety; vc for proper foot placement for more secure balance  Unilateral UE shoulder extension Green TB with UE support X 10 reps with tactile cues for balance and verbal cues on technique.   Unilateral UE tricep extension green TB with UE support X 10 reps with tactile cues for balance and verbal cues on technique.    HOME EXERCISE PROGRAM: Access Code: [email or web address] URL: [email or web address] Date: 04/12/2023 Prepared by: Vladimir Faster   Exercises - Seated Table Hamstring Stretch  - 1-3 x daily - 7 x weekly - 1 sets - 2-3 reps - 20-30 seconds hold - Modified Thomas Stretch  - 2 x daily - 7 x weekly - 1 sets - 2-3 reps - 30 seconds hold - Hip Flexor Stretch at [employer] of Bed  - 2 x daily - 7 x weekly - 1 sets - 2-3 reps - 30 seconds hold - Standing posture with back to counter  - 2 x daily - 7 x weekly - 1 sets - 1 reps - 5-10 minutes hold - Upright Stance at Door Frame Single Arm  - 3-6 x daily - 7 x weekly - 1 sets - 2 reps - 2 deep breathes hold - Upright Stance at Door Frame with Both Arms  - 3-6 x daily - 7 x weekly - 1 sets - 2 reps - 2 deep breathes hold [place]  Patient Portal      Do each exercise 1-2  times per day Do each exercise 5-10 repetitions Hold each exercise for 2 seconds to feel your location  AT SINK FIND YOUR MIDLINE POSITION AND PLACE FEET EQUAL DISTANCE FROM THE MIDLINE.  Try to find this position when standing still for activities.   USE TAPE ON FLOOR TO MARK THE MIDLINE POSITION which is even with middle of sink.  You also should try to feel with your limb pressure in socket.  You are trying to feel with limb what you used to feel with the bottom of your foot.  Side to Side Shift: Moving your hips only (not shoulders): move weight onto your left leg, HOLD/FEEL pressure in socket.  Move back to equal weight on each leg, HOLD/FEEL pressure in socket. Move weight onto your right leg, HOLD/FEEL pressure in socket. Move back to equal weight on each leg, HOLD/FEEL pressure in socket. Repeat.  Start with both hands on sink, progress to hand on prosthetic side only, then no hands.  Front to Back Shift: Moving your hips only (not shoulders):  move your weight forward onto your toes, HOLD/FEEL pressure in socket. Move your weight back to equal Flat Foot on both legs, HOLD/FEEL  pressure in socket. Move your weight back onto your heels, HOLD/FEEL  pressure in socket. Move your weight back to equal on both legs, HOLD/FEEL  pressure in socket. Repeat.  Start with both hands on sink, progress to hand on prosthetic side only, then no hands.  Moving Cones / Cups: With equal weight on each  leg: Hold on with one hand the first time, then progress to no hand supports. Move cups from one side of sink to the other. Place cups ~2" out of your reach, progress to 10" beyond reach.  Place one hand in middle of sink and reach with other hand. Do both arms.  Then hover one hand and move cups with other hand.  Overhead/Upward Reaching: alternated reaching up to top cabinets or ceiling if no cabinets present. Keep equal weight on each leg. Start with one hand support on counter while other hand reaches and progress to no hand support with reaching.  ace one hand in middle of sink and reach with other hand. Do both arms.  Then hover one hand and move cups with other hand.  5.   Looking Over Shoulders: With equal weight on each leg: alternate turning to look over your shoulders with one hand support on counter as needed.  Start with head motions only to look in front of shoulder, then even with shoulder and progress to looking behind you. To look to side, move head /eyes, then shoulder on side looking pulls back, shift more weight to side looking and pull hip back. Place one hand in middle of sink and let go with other hand so your shoulder can pull back. Switch hands to look other way.   Then hover one hand and look over shoulder. If looking right, use left hand at sink. If looking left, use right hand at sink. 6.  Stepping with leg that is not amputated:  Move items under cabinet out of your way. Shift your hips/pelvis so weight on prosthesis. Tighten muscles in hip on  prosthetic side.  SLOWLY step other leg so front of foot is in cabinet. Then step back to floor.    ASSESSMENT:  CLINICAL IMPRESSION: Session focused on posture including addressing hip flexor tightness. Patient was able to demonstrate safety and understanding of updated HEP. Will require revisiting floor transfers for increased carryover. Patient will benefit from continued skilled physical therapy to address deficits. PT anticipates need for recertification next week at end of current plan of care.   OBJECTIVE IMPAIRMENTS: Abnormal gait, decreased activity tolerance, decreased balance, decreased endurance, decreased knowledge of condition, decreased knowledge of use of DME, decreased mobility, difficulty walking, decreased ROM, decreased strength, increased edema, impaired flexibility, postural dysfunction, prosthetic dependency , obesity, and pain.   ACTIVITY LIMITATIONS: carrying, lifting, bending, sitting, standing, stairs, transfers, and locomotion level  PARTICIPATION LIMITATIONS: meal prep, cleaning, and community activity  PERSONAL FACTORS: Fitness, Time since onset of injury/illness/exacerbation, and 3+ comorbidities: see PMH  are also affecting patient's functional outcome.   REHAB POTENTIAL: Good  CLINICAL DECISION MAKING: Evolving/moderate complexity  EVALUATION COMPLEXITY: Moderate   GOALS: Goals reviewed with patient? Yes  SHORT TERM GOALS: Target date: 02/25/2023  Patient donnes prosthesis modified independent & verbalizes proper cleaning. Baseline: SEE OBJECTIVE DATA Goal status: MET 02/25/2023 2.  Patient tolerates prosthesis >10 hrs total /day without skin issues or limb pain after standing. Baseline: SEE OBJECTIVE DATA Goal status: MET 02/25/2023  3.  Berg Balance >20/56 Baseline: SEE OBJECTIVE DATA Goal status: MET 03/02/2023  4. Patient ambulates 24' with RW & prosthesis with supervision. Baseline: SEE OBJECTIVE DATA Goal status: MET 03/23/2023  5. Patient  negotiates ramps & curbs with RW & prosthesis with minA. Baseline: SEE OBJECTIVE DATA Goal status: MET  03/25/2023  LONG TERM GOALS: Target date: 04/22/2023  Patient demonstrates & verbalized understanding of prosthetic care to enable  safe utilization of prosthesis. Baseline: SEE OBJECTIVE DATA Goal status: Ongoing   04/12/2023  Patient tolerates prosthesis wear >90% of awake hours without skin or residual limb pain issues. Baseline: SEE OBJECTIVE DATA Goal status: Ongoing  04/12/2023  Berg Balance >35/56 to indicate lower fall risk Baseline: SEE OBJECTIVE DATA Goal status: Ongoing   04/12/2023  Patient ambulates >300' with prosthesis and cane or less modified independent. Baseline: SEE OBJECTIVE DATA Goal status: Ongoing   04/12/2023  Patient negotiates ramps, curbs & stairs with single rail with prosthesis  and cane or less modified independent. Baseline: SEE OBJECTIVE DATA Goal status: Ongoing   04/12/2023   Patient reports right knee pain </= 2/10 with standing and gait activities.   Baseline: SEE OBJECTIVE DATA Goal status:   Ongoing   04/12/2023  PLAN:  PT FREQUENCY:  3 x for 1 week, then 2x/week  PT DURATION: 90 days / 13 weeks  PLANNED INTERVENTIONS: 97164- PT Re-evaluation, 97110-Therapeutic exercises, 97530- Therapeutic activity, 97112- Neuromuscular re-education, 97535- Self Care, 16109- Gait training, [id number]- Prosthetic training, Patient/Family education, Balance training, Stair training, DME instructions, and physical performance testing  PLAN FOR NEXT SESSION:  continue ambulation with step through gait pattern, revisit 4 point forearm ambulation, check HEP stretching compliance.    Jessieca Rhem, Janeann Paisley, Student-PT 04/12/2023, 10:22 AM  This entire session of physical therapy was performed under the direct supervision of PT signing evaluation /treatment. PT reviewed note and agrees.   Vladimir Faster, PT, DPT 04/12/2023, 12:56 PM   Date of referral: 12/22/2022 Referring  provider: Aldean Baker, MD Referring diagnosis? U98.119 (ICD-10-CM) - Hx of AKA (above knee amputation), left  Treatment diagnosis? (if different than referring diagnosis)  Other abnormalities of gait and mobility  ICD-10-CM: R26.89    Unsteadiness on feet  ICD-10-CM: R26.81    Muscle weakness (generalized)  ICD-10-CM: M62.81    Chronic pain of right knee  M25.561 & G89.29   History of falling Z91.81   What was this (referring dx) caused by? Surgery (Type: AKA)   Nature of Condition: Initial Onset (within last 3 months) for prosthesis delivery              Laterality: Lt   Current Functional Measure Score: Other 03/02/2023: Sharlene Motts Balance 22/56  at eval Berg was 15/56   Objective measurements identify impairments when they are compared to normal values, the uninvolved extremity, and prior level of function.             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  She has improved wear & care with PT education.     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     Average pain intensity:             Last 24 hours: 1-2/10             Past week: 1-2/10   How often does the pt experience symptoms? Constantly   How much have the symptoms interfered with usual daily activities? Extremely   How has condition changed since care began at this facility? Better   In general, how is the patients overall health? Very Good     BACK PAIN (STarT Back Screening  Tool) No

## 2023-04-14 ENCOUNTER — Encounter: Payer: Medicare Other | Admitting: Physical Therapy

## 2023-04-19 ENCOUNTER — Encounter: Payer: Medicare Other | Admitting: Physical Therapy

## 2023-04-20 ENCOUNTER — Ambulatory Visit (INDEPENDENT_AMBULATORY_CARE_PROVIDER_SITE_OTHER): Admitting: Physical Therapy

## 2023-04-20 DIAGNOSIS — R2681 Unsteadiness on feet: Secondary | ICD-10-CM | POA: Diagnosis not present

## 2023-04-20 DIAGNOSIS — Z9181 History of falling: Secondary | ICD-10-CM | POA: Diagnosis not present

## 2023-04-20 DIAGNOSIS — R2689 Other abnormalities of gait and mobility: Secondary | ICD-10-CM

## 2023-04-20 DIAGNOSIS — R262 Difficulty in walking, not elsewhere classified: Secondary | ICD-10-CM

## 2023-04-20 DIAGNOSIS — M6281 Muscle weakness (generalized): Secondary | ICD-10-CM | POA: Diagnosis not present

## 2023-04-20 DIAGNOSIS — M25561 Pain in right knee: Secondary | ICD-10-CM

## 2023-04-20 DIAGNOSIS — G8929 Other chronic pain: Secondary | ICD-10-CM

## 2023-04-20 NOTE — Therapy (Addendum)
 OUTPATIENT PHYSICAL THERAPY PROSTHETIC TREATMENT   Patient Name: Tammy [name] MRN: [id number] DOB:[birth year], 68 y.o., female Today's Date: 04/20/2023    END OF SESSION:  PT End of Session - 04/20/23 1606     Visit Number 16    Number of Visits 27    Date for PT Re-Evaluation 04/22/23    Authorization Type [employer] Medicare    Authorization Time Period $20 COPAY 8 PT VISITS APPROVED 03/23/2023-04/20/2023    Authorization - Number of Visits 8    Progress Note Due on Visit 20    PT Start Time 1519    PT Stop Time 1600    PT Time Calculation (min) 41 min    Equipment Utilized During Treatment Gait belt    Activity Tolerance Patient tolerated treatment well    Behavior During Therapy WFL for tasks assessed/performed                 Past Medical History:  Diagnosis Date   Anemia    hx of   Anxiety    on meds   Arthritis    back/bilateral ankles   Bipolar disorder (HCC)    Breast cancer (HCC) 2022   LEFT breast-radiation   Depression    on meds   Family history of breast cancer 07/10/2020   Family history of colon cancer 07/10/2020   Family history of ovarian cancer 07/10/2020   GERD (gastroesophageal reflux disease)    on meds   Heart murmur    dx by PCP   Hepatitis C    HTN (hypertension)    on meds   Hx of radiation therapy    Liver cirrhosis (HCC)    from hep c   Neuromuscular disorder (HCC)    Osteopenia    Personal history of radiation therapy    Stroke (HCC) 09/2012   slurred speech and LEFT sided weakness/notes 10/04/2012   Past Surgical History:  Procedure Laterality Date   AMPUTATION Left 11/04/2022   Procedure: LEFT ABOVE KNEE AMPUTATION;  Surgeon: Nadara Mustard, MD;  Location: [facility];  Service: Orthopedics;  Laterality: Left;   ANKLE FRACTURE SURGERY Left 2018   APPLICATION OF WOUND VAC Left 06/15/2022   Procedure: APPLICATION OF WOUND VAC;  Surgeon: Peggye Form, DO;  Location: [facility];  Service: Plastics;  Laterality: Left;    APPLICATION OF WOUND VAC Left 09/14/2022   Procedure: APPLICATION OF WOUND VAC;  Surgeon: Peggye Form, DO;  Location: [facility] OR;  Service: Plastics;  Laterality: Left;   BREAST LUMPECTOMY Left 07/2020   BREAST LUMPECTOMY WITH RADIOACTIVE SEED AND SENTINEL LYMPH NODE BIOPSY Left 07/26/2020   Procedure: LEFT BREAST LUMPECTOMY WITH RADIOACTIVE SEED AND SENTINEL LYMPH NODE BIOPSY;  Surgeon: Abigail Miyamoto, MD;  Location: [facility];  Service: General;  Laterality: Left;   CESAREAN SECTION  1986   COLONOSCOPY     DIAGNOSTIC LAPAROSCOPY  1985   ESOPHAGOGASTRODUODENOSCOPY N/A 02/21/2023   Procedure: ESOPHAGOGASTRODUODENOSCOPY (EGD);  Surgeon: Hilarie Fredrickson, MD;  Location: [facility];  Service: Gastroenterology;  Laterality: N/A;   HARDWARE REMOVAL Left 04/30/2022   Procedure: HARDWARE REMOVAL LEFT FEMUR;  Surgeon: Samson Frederic, MD;  Location: [facility];  Service: Orthopedics;  Laterality: Left;  150   HARDWARE REMOVAL Left 11/04/2022   Procedure: HARDWARE REMOVAL INFECTED LEFT TOTAL KNEE ARTHROPLASTY;  Surgeon: Nadara Mustard, MD;  Location: [facility] OR;  Service: Orthopedics;  Laterality: Left;   I & D KNEE WITH POLY EXCHANGE Left 06/07/2022  Procedure: IRRIGATION AND DEBRIDEMENT KNEE WITH POLY EXCHANGE;  Surgeon: Samson Frederic, MD;  Location: [facility];  Service: Orthopedics;  Laterality: Left;   INCISION AND DRAINAGE OF WOUND Left 06/04/2022   Procedure: IRRIGATION AND DEBRIDEMENT KNEE;  Surgeon: Samson Frederic, MD;  Location: [facility];  Service: Orthopedics;  Laterality: Left;  60   INCISION AND DRAINAGE OF WOUND Left 06/15/2022   Procedure: excision of knee wound with Myriad;  Surgeon: Peggye Form, DO;  Location: [facility];  Service: Plastics;  Laterality: Left;   ORIF FEMUR FRACTURE Left 04/10/2021   Procedure: OPEN REDUCTION INTERNAL FIXATION (ORIF) DISTAL FEMUR FRACTURE;  Surgeon: Myrene Galas, MD;  Location: [facility];  Service: Orthopedics;  Laterality: Left;   PARTIAL  KNEE ARTHROPLASTY Left 04/30/2022   Procedure: DISTAL FEMUR REPLACEMENT;  Surgeon: Samson Frederic, MD;  Location: [facility];  Service: Orthopedics;  Laterality: Left;  150   Patient Active Problem List   Diagnosis Date Noted   Left femoral vein DVT (HCC) 02/20/2023   GERD (gastroesophageal reflux disease) 02/20/2023   Acute upper gastrointestinal bleeding 02/19/2023   Bipolar 1 disorder, depressed, partial remission (HCC) 11/18/2022   Left above-knee amputee (HCC) 11/13/2022   Infection of total knee replacement (HCC) 11/04/2022   S/P AKA (above knee amputation) unilateral, left (HCC) 11/04/2022   Chronic hepatitis C with cirrhosis (HCC) 06/06/2022   Surgical wound dehiscence, initial encounter 06/04/2022   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, left, with nonunion, subsequent encounter 04/30/2022   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 04/30/2022   Hypokalemia 04/16/2021   Hip fracture requiring operative repair (HCC) 04/16/2021   Vitamin D deficiency 04/12/2021   Hyponatremia 04/11/2021   Acute postoperative anemia due to expected blood loss 04/11/2021   Class 1 obesity 04/11/2021   Left comminuted and displaced fracture of the distal femur metaphysis 04/08/2021   History of CVA (cerebrovascular accident) 04/08/2021   Genetic testing 07/17/2020   Family history of breast cancer 07/10/2020   Family history of ovarian cancer 07/10/2020   Family history of colon cancer 07/10/2020   Malignant neoplasm of upper-outer quadrant of left breast in female, estrogen receptor positive (HCC) 07/09/2020   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   Chronic pain syndrome 10/06/2012   Nicotine dependence 10/06/2012   Anxiety state 10/05/2012   Bipolar disorder, unspecified (HCC) 10/05/2012   Slurred speech 10/04/2012   Left-sided weakness 10/04/2012   Hyperkalemia 10/04/2012   Acute CVA (cerebrovascular accident) (HCC)  10/04/2012   Essential hypertension     PCP: Quitman Livings, MD  REFERRING PROVIDER: Aldean Baker, MD  ONSET DATE: 01/25/2023 prosthesis delivery  REFERRING DIAG: U98.119 (ICD-10-CM) - Hx of AKA (above knee amputation), left   THERAPY DIAG:  Other abnormalities of gait and mobility  Unsteadiness on feet  Muscle weakness (generalized)  S/P AKA (above knee amputation) unilateral, left (HCC)  Difficulty in walking, not elsewhere classified  History of falling  Rationale for Evaluation and Treatment: Rehabilitation  SUBJECTIVE:   SUBJECTIVE STATEMENT: Patient states that she fell yesterday posteriorly (not hard) due to being too tired from cooking, she was at the fridge and went to turn and lost balance. She is sore today but not as sore as the last time she fell. She did attempt to get up from the floor but due to her prosthetic leg getting twisted around from the socket she was not able to get up.    Pt accompanied by: [name], significant other  PERTINENT  HISTORY: AKA, anemia, anxiety, arthritis, Bipolar disorder, Breast CA left with radiation, HTN, Hep C, liver cirrhosis, Neuromuscular disorder, osteopenia, stroke Left hemiparesis 2014  PAIN:  Are you having pain? Yes, 1/10 in left leg, 5/10 on Right side after fall last week Pain location: residual limb Pain description: soreness Aggravating factors: standing or walking too much, or twisting motion Relieving factors: resting  PRECAUTIONS: Fall  WEIGHT BEARING RESTRICTIONS: No  FALLS: Has patient fallen in last 6 months? Yes. Number of falls 1 no injuries  LIVING ENVIRONMENT: Lives with: lives with their spouse Lives in: Mobile home Home Access: Ramped entrance or 4 steps with double rail to [employer] layout: One level Has following equipment at home: Single point cane, Environmental consultant - 2 wheeled, Wheelchair (manual), Shower bench, bed side commode, Grab bars, and Ramped entry  OCCUPATION: retired from nursing  PLOF:  Independent prior to Fracture 04/10/2021.  Since fracture was able to get to cane for community until hardware broke.    PATIENT GOALS:  walk independently in community, travel, drive, take care of 4yo great granddaughter  OBJECTIVE:  COGNITION: Overall cognitive status: Within functional limits for tasks assessed  POSTURE: rounded shoulders, forward head, flexed trunk , and weight shift right  LOWER EXTREMITY ROM:  ROM P:passive  A:active Right eval Left eval  Hip flexion    Hip extension    Hip abduction    Hip adduction    Hip internal rotation    Hip external rotation    Knee flexion    Knee extension    Ankle dorsiflexion    Ankle plantarflexion    Ankle inversion    Ankle eversion     (Blank rows = not tested)  LOWER EXTREMITY MMT:  MMT Right eval Left eval  Hip flexion    Hip extension    Hip abduction    Hip adduction    Hip internal rotation    Hip external rotation    Knee flexion    Knee extension    Ankle dorsiflexion    Ankle plantarflexion    Ankle inversion    Ankle eversion    At Evaluation all strength testing is grossly seated and functionally standing / gait. (Blank rows = not tested)  TRANSFERS: Evaluation on 01/25/2023:  Sit to stand: SBA w/c to RW requires armrests on w/c and stabilization with RW.   Stand to sit: SBA RW to w/c requires armrests on w/c and stabilization with RW.    FUNCTIONAL TESTs:  03/02/2023: Sharlene Motts Balance 22/56  Eval: Berg Balance Scale: 15/56   GAIT: 04/20/2023: gait velocity with RW: 0.4ft/sec  03/18/2023: -Patient ambulated 177ft and performing a 180deg turn CGA for patient safety with intermittent SBA  01/26/2023: Patient ambulated 10 feet with rolling walker with minA but required constant cueing for technique and safety.  PALPATION: 03/18/2023: patient verbalized seeing areas of discoloration on residual limb a few days prior. Therapist palpation and examination of area, did not see any noticeable areas  with patient stating that the areas must have gone away without issue.    CURRENT PROSTHETIC WEAR ASSESSMENT: Evaluation on 01/25/2023:  Patient is dependent with: skin check, residual limb care, care of non-amputated limb, prosthetic cleaning, ply sock cleaning, correct ply sock adjustment, proper wear schedule/adjustment, and proper weight-bearing schedule/adjustment Donning prosthesis: Min A Doffing prosthesis: SBA Prosthetic wear tolerance: tolerated wear 1 hour prior to PT evaluation.  Prosthetic weight bearing tolerance: 5 minutes with BUE support on RW with limited wear on prosthesis.  Edema: pitting Prosthetic description: Ischial Containment socket with flexible inner socket, silicon liner with velcro strap suspension.   TODAY'S TREATMENT:                                                                                                                             DATE:  04/20/2023: Prosthetic Training with Transfemoral Prosthesis: Ambulated 5ft with turn with lofstrand crutches modAx2 for safety and cuing of patient; required heavy cues for sequencing as well as demo.   STS with lofstrand crutches vc for sequencing modA  Patient ambulated 140ft with RW CGA with intermittent SBA; heavy cuing for progression of walker, swing through with RLE, and increased stance time on LLE. Patient progressed stance time on LLE as well as step through pattern.  STS from 18in chair without armrest using RW with minA   TREATMENT:                                                                                                                             DATE:  04/12/2023: Prosthetic Training with Transfemoral Prosthesis: Ambulated 20' & 40' with RW CGA to SBA as patient demonstrated increased safety.   Therapeutic Activities: Patient was educated with demo & verbal cues on floor transfer with safety for instances of falls via demo and walk through of sequencing. Patient performed transfer with BUEs on  18" chair seat to half kneeling with PT maxA.  Pt had limitation WB on prosthetic knee due left hip ext ROM and weakness in BLEs.    Neuromuscular Re-education working on posture and balance: Hip flexor stretch supine hooklying 2x30sec hold Hip flexor stretch supine with knee to chest 2x30sec hold; vc for sequencing and maintaining knee to chest before lowering LLE Seated hamstring stretch 2x30sec; vc for proper positioning Standing counter stretch 2x30sec; vc and education for proper posture Upright stance at doorway x2 ea UE with 2 breaths then BUE x2 breaths with RW in front of patient for safety; vc for sequencing to achieve LE positioning before backing up to door frame  Patient educated on updated HEP verbalizing understanding   TREATMENT:  DATE:  04/07/2023: Prosthetic Training with Transfemoral Prosthesis: Patient ambulated 60 feet with RW and CGA Ambulation then progressed to 4pt gait with lofstrand crutches, she needed max cues and demo for proper sequence. Performed 15 feet X 2 with min A for balance and wheelchair follow from fiance for safety.    TREATMENT:                                                                                                                             DATE:  03/30/2023: Therex: Nu step seat 9, level 3 for endurance, . Educated on increasing endurance with recumbent stepper or bike at [facility] so that she is able to walk into buildings instead of being pushed in wc. Pt verbalized general understanding.   While on nustep, educated on transport chair and its use, use as a rest point if ambulating longer distances at home, proper specs for ease of mobility and reduction of caretaker burden. Pt verbalized understanding.     Prosthetic Training with Transfemoral Prosthesis: Patient ambulated 35ft with RW SBA with step-to gait. PT verbal  cues to facilitate step through pattern to facilitate prosthesis control.   STS with RW from 18in chair with arm rest SBA with patient demonstrating increased confidence and safety  Neuro: Standing balance with blue TB and RW support/RW in front of patient if patient were to need it:  BUEs rows x10 ea unilateral/bilateral/alternating; CGA throughout with a few instances of patient requiring RW assist to regain balance.  UE punches at 90deg x10 ea unilateral with RW support then without UE support with patient demonstrating good weight shifting, Few instances of patient needing to regain balance with RW. CGA for safety; vc for proper foot placement for more secure balance  Unilateral UE shoulder extension Green TB with UE support X 10 reps with tactile cues for balance and verbal cues on technique.  Unilateral UE tricep extension green TB with UE support X 10 reps with tactile cues for balance and verbal cues on technique.    HOME EXERCISE PROGRAM: Access Code: [email or web address] URL: [email or web address] Date: 04/12/2023 Prepared by: Vladimir Faster   Exercises - Seated Table Hamstring Stretch  - 1-3 x daily - 7 x weekly - 1 sets - 2-3 reps - 20-30 seconds hold - Modified Thomas Stretch  - 2 x daily - 7 x weekly - 1 sets - 2-3 reps - 30 seconds hold - Hip Flexor Stretch at [employer] of Bed  - 2 x daily - 7 x weekly - 1 sets - 2-3 reps - 30 seconds hold - Standing posture with back to counter  - 2 x daily - 7 x weekly - 1 sets - 1 reps - 5-10 minutes hold - Upright Stance at Door Frame Single Arm  - 3-6 x daily - 7 x weekly - 1 sets - 2 reps - 2 deep breathes hold - Upright Stance at Door Frame with Both Arms  - 3-6 x daily -  7 x weekly - 1 sets - 2 reps - 2 deep breathes hold [employer]  Patient Portal      Do each exercise 1-2  times per day Do each exercise 5-10 repetitions Hold each exercise for 2 seconds to feel your location  AT SINK FIND YOUR MIDLINE POSITION AND PLACE FEET  EQUAL DISTANCE FROM THE MIDLINE.  Try to find this position when standing still for activities.   USE TAPE ON FLOOR TO MARK THE MIDLINE POSITION which is even with middle of sink.  You also should try to feel with your limb pressure in socket.  You are trying to feel with limb what you used to feel with the bottom of your foot.  Side to Side Shift: Moving your hips only (not shoulders): move weight onto your left leg, HOLD/FEEL pressure in socket.  Move back to equal weight on each leg, HOLD/FEEL pressure in socket. Move weight onto your right leg, HOLD/FEEL pressure in socket. Move back to equal weight on each leg, HOLD/FEEL pressure in socket. Repeat.  Start with both hands on sink, progress to hand on prosthetic side only, then no hands.  Front to Back Shift: Moving your hips only (not shoulders): move your weight forward onto your toes, HOLD/FEEL pressure in socket. Move your weight back to equal Flat Foot on both legs, HOLD/FEEL  pressure in socket. Move your weight back onto your heels, HOLD/FEEL  pressure in socket. Move your weight back to equal on both legs, HOLD/FEEL  pressure in socket. Repeat.  Start with both hands on sink, progress to hand on prosthetic side only, then no hands.  Moving Cones / Cups: With equal weight on each leg: Hold on with one hand the first time, then progress to no hand supports. Move cups from one side of sink to the other. Place cups ~2" out of your reach, progress to 10" beyond reach.  Place one hand in middle of sink and reach with other hand. Do both arms.  Then hover one hand and move cups with other hand.  Overhead/Upward Reaching: alternated reaching up to top cabinets or ceiling if no cabinets present. Keep equal weight on each leg. Start with one hand support on counter while other hand reaches and progress to no hand support with reaching.  ace one hand in middle of sink and reach with other hand. Do both arms.  Then hover one hand and move cups with other  hand.  5.   Looking Over Shoulders: With equal weight on each leg: alternate turning to look over your shoulders with one hand support on counter as needed.  Start with head motions only to look in front of shoulder, then even with shoulder and progress to looking behind you. To look to side, move head /eyes, then shoulder on side looking pulls back, shift more weight to side looking and pull hip back. Place one hand in middle of sink and let go with other hand so your shoulder can pull back. Switch hands to look other way.   Then hover one hand and look over shoulder. If looking right, use left hand at sink. If looking left, use right hand at sink. 6.  Stepping with leg that is not amputated:  Move items under cabinet out of your way. Shift your hips/pelvis so weight on prosthesis. Tighten muscles in hip on prosthetic side.  SLOWLY step other leg so front of foot is in cabinet. Then step back to floor.    ASSESSMENT:  CLINICAL IMPRESSION: Patient did well with ambulation however due to increased falls at home recently, decided best plan would to be using a RW for ambulation as patient is more secure. Patient continues to demonstrate need for physical therapy as she demonstrates weakness and deviations are present in gait that make tasks more effortful. Anticipating  re-certification for upcoming visit.   OBJECTIVE IMPAIRMENTS: Abnormal gait, decreased activity tolerance, decreased balance, decreased endurance, decreased knowledge of condition, decreased knowledge of use of DME, decreased mobility, difficulty walking, decreased ROM, decreased strength, increased edema, impaired flexibility, postural dysfunction, prosthetic dependency , obesity, and pain.   ACTIVITY LIMITATIONS: carrying, lifting, bending, sitting, standing, stairs, transfers, and locomotion level  PARTICIPATION LIMITATIONS: meal prep, cleaning, and community activity  PERSONAL FACTORS: Fitness, Time since onset of  injury/illness/exacerbation, and 3+ comorbidities: see PMH  are also affecting patient's functional outcome.   REHAB POTENTIAL: Good  CLINICAL DECISION MAKING: Evolving/moderate complexity  EVALUATION COMPLEXITY: Moderate   GOALS: Goals reviewed with patient? Yes  SHORT TERM GOALS: Target date: 02/25/2023  Patient donnes prosthesis modified independent & verbalizes proper cleaning. Baseline: SEE OBJECTIVE DATA Goal status: MET 02/25/2023 2.  Patient tolerates prosthesis >10 hrs total /day without skin issues or limb pain after standing. Baseline: SEE OBJECTIVE DATA Goal status: MET 02/25/2023  3.  Berg Balance >20/56 Baseline: SEE OBJECTIVE DATA Goal status: MET 03/02/2023  4. Patient ambulates 1' with RW & prosthesis with supervision. Baseline: SEE OBJECTIVE DATA Goal status: MET 03/23/2023  5. Patient negotiates ramps & curbs with RW & prosthesis with minA. Baseline: SEE OBJECTIVE DATA Goal status: MET  03/25/2023  LONG TERM GOALS: Target date: 04/22/2023  Patient demonstrates & verbalized understanding of prosthetic care to enable safe utilization of prosthesis. Baseline: SEE OBJECTIVE DATA Goal status: Ongoing   04/12/2023  Patient tolerates prosthesis wear >90% of awake hours without skin or residual limb pain issues. Baseline: SEE OBJECTIVE DATA Goal status: Ongoing  04/12/2023  Berg Balance >35/56 to indicate lower fall risk Baseline: SEE OBJECTIVE DATA Goal status: Ongoing   04/12/2023  Patient ambulates >300' with prosthesis and cane or less modified independent. Baseline: SEE OBJECTIVE DATA Goal status: Ongoing   04/12/2023  Patient negotiates ramps, curbs & stairs with single rail with prosthesis  and cane or less modified independent. Baseline: SEE OBJECTIVE DATA Goal status: Ongoing   04/12/2023   Patient reports right knee pain </= 2/10 with standing and gait activities.   Baseline: SEE OBJECTIVE DATA Goal status:   Ongoing   04/12/2023  PLAN:  PT FREQUENCY:   3 x for 1 week, then 2x/week  PT DURATION: 90 days / 13 weeks  PLANNED INTERVENTIONS: 97164- PT Re-evaluation, 97110-Therapeutic exercises, 97530- Therapeutic activity, 97112- Neuromuscular re-education, [phone]- Self Care, 60454- Gait training, [phone]- Prosthetic training, Patient/Family education, Balance training, Stair training, DME instructions, and physical performance testing  PLAN FOR NEXT SESSION:  assess goals for recert with updated STGs & LTGs, TUG with RW,  LE strengthening- leg press, LAQ weighted, HSC weighted     Trei Schoch, Armandina Iman, Student-PT 04/20/2023, 4:08 PM  This entire session of physical therapy was performed under the direct supervision of PT signing evaluation /treatment. PT reviewed note and agrees.   Vladimir Faster, PT, DPT 04/20/2023, 4:30 PM   Date of referral: 12/22/2022 Referring provider: Aldean Baker, MD Referring diagnosis? B14.782 (ICD-10-CM) - Hx of AKA (above knee amputation), left  Treatment diagnosis? (if different than referring diagnosis)  Other abnormalities of gait and mobility  ICD-10-CM: R26.89  Unsteadiness on feet  ICD-10-CM: R26.81    Muscle weakness (generalized)  ICD-10-CM: M62.81    Chronic pain of right knee  M25.561 & G89.29   History of falling Z91.81   What was this (referring dx) caused by? Surgery (Type: AKA)   Nature of Condition: Initial Onset (within last 3 months) for prosthesis delivery              Laterality: Lt   Current Functional Measure Score: Other 03/02/2023: Sharlene Motts Balance 22/56  at eval Berg was 15/56   Objective measurements identify impairments when they are compared to normal values, the uninvolved extremity, and prior level of function.             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  She has improved  wear & care with PT education.     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     Average pain intensity:             Last 24 hours: 1-2/10             Past week: 1-2/10   How often does the pt experience symptoms? Constantly   How much have the symptoms interfered with usual daily activities? Extremely   How has condition changed since care began at this facility? Better   In general, how is the patients overall health? Very Good     BACK PAIN (STarT Back Screening Tool) No

## 2023-04-21 ENCOUNTER — Encounter: Payer: Self-pay | Admitting: Physical Therapy

## 2023-04-21 ENCOUNTER — Ambulatory Visit (INDEPENDENT_AMBULATORY_CARE_PROVIDER_SITE_OTHER): Payer: Medicare Other | Admitting: Physical Therapy

## 2023-04-21 DIAGNOSIS — R2689 Other abnormalities of gait and mobility: Secondary | ICD-10-CM | POA: Diagnosis not present

## 2023-04-21 DIAGNOSIS — R262 Difficulty in walking, not elsewhere classified: Secondary | ICD-10-CM | POA: Diagnosis not present

## 2023-04-21 DIAGNOSIS — R293 Abnormal posture: Secondary | ICD-10-CM

## 2023-04-21 DIAGNOSIS — M25561 Pain in right knee: Secondary | ICD-10-CM

## 2023-04-21 DIAGNOSIS — M6281 Muscle weakness (generalized): Secondary | ICD-10-CM

## 2023-04-21 DIAGNOSIS — Z9181 History of falling: Secondary | ICD-10-CM

## 2023-04-21 DIAGNOSIS — G8929 Other chronic pain: Secondary | ICD-10-CM

## 2023-04-21 DIAGNOSIS — R2681 Unsteadiness on feet: Secondary | ICD-10-CM

## 2023-04-21 NOTE — Therapy (Addendum)
 OUTPATIENT PHYSICAL THERAPY PROSTHETIC TREATMENT AND PROGRESS/RECERTIFICATION NOTE   Patient Name: Tammy [name] MRN: [id number] DOB:Dec 27, 1955, 68 y.o., female Today's Date: 04/21/2023   Progress Note Reporting Period 04/07/2023 to 04/21/2023  See note below for Objective Data and Assessment of Progress/Goals.   END OF SESSION:  PT End of Session - 04/21/23 1025     Visit Number 17    Number of Visits 44    Date for PT Re-Evaluation 07/20/23    Authorization Type [employer] Medicare    Authorization Time Period $20 COPAY 8 PT VISITS APPROVED 03/23/2023-04/20/2023    Authorization - Visit Number 7    Authorization - Number of Visits 8    Progress Note Due on Visit 27    PT Start Time 1023    PT Stop Time 1101    PT Time Calculation (min) 38 min    Equipment Utilized During Treatment Gait belt    Activity Tolerance Patient tolerated treatment well    Behavior During Therapy WFL for tasks assessed/performed                  Past Medical History:  Diagnosis Date   Anemia    hx of   Anxiety    on meds   Arthritis    back/bilateral ankles   Bipolar disorder (HCC)    Breast cancer (HCC) 2022   LEFT breast-radiation   Depression    on meds   Family history of breast cancer 07/10/2020   Family history of colon cancer 07/10/2020   Family history of ovarian cancer 07/10/2020   GERD (gastroesophageal reflux disease)    on meds   Heart murmur    dx by PCP   Hepatitis C    HTN (hypertension)    on meds   Hx of radiation therapy    Liver cirrhosis (HCC)    from hep c   Neuromuscular disorder (HCC)    Osteopenia    Personal history of radiation therapy    Stroke (HCC) 09/2012   slurred speech and LEFT sided weakness/notes 10/04/2012   Past Surgical History:  Procedure Laterality Date   AMPUTATION Left 11/04/2022   Procedure: LEFT ABOVE KNEE AMPUTATION;  Surgeon: Nadara Mustard, MD;  Location: [facility] OR;  Service: Orthopedics;  Laterality: Left;   ANKLE  FRACTURE SURGERY Left 2018   APPLICATION OF WOUND VAC Left 06/15/2022   Procedure: APPLICATION OF WOUND VAC;  Surgeon: Peggye Form, DO;  Location: [facility];  Service: Plastics;  Laterality: Left;   APPLICATION OF WOUND VAC Left 09/14/2022   Procedure: APPLICATION OF WOUND VAC;  Surgeon: Peggye Form, DO;  Location: [facility] OR;  Service: Plastics;  Laterality: Left;   BREAST LUMPECTOMY Left 07/2020   BREAST LUMPECTOMY WITH RADIOACTIVE SEED AND SENTINEL LYMPH NODE BIOPSY Left 07/26/2020   Procedure: LEFT BREAST LUMPECTOMY WITH RADIOACTIVE SEED AND SENTINEL LYMPH NODE BIOPSY;  Surgeon: Abigail Miyamoto, MD;  Location: [facility];  Service: General;  Laterality: Left;   CESAREAN SECTION  1986   COLONOSCOPY     DIAGNOSTIC LAPAROSCOPY  1985   ESOPHAGOGASTRODUODENOSCOPY N/A 02/21/2023   Procedure: ESOPHAGOGASTRODUODENOSCOPY (EGD);  Surgeon: Hilarie Fredrickson, MD;  Location: [facility];  Service: Gastroenterology;  Laterality: N/A;   HARDWARE REMOVAL Left 04/30/2022   Procedure: HARDWARE REMOVAL LEFT FEMUR;  Surgeon: Samson Frederic, MD;  Location: [facility];  Service: Orthopedics;  Laterality: Left;  150   HARDWARE REMOVAL Left 11/04/2022   Procedure: HARDWARE REMOVAL INFECTED  LEFT TOTAL KNEE ARTHROPLASTY;  Surgeon: Nadara Mustard, MD;  Location: [facility] OR;  Service: Orthopedics;  Laterality: Left;   I & D KNEE WITH POLY EXCHANGE Left 06/07/2022   Procedure: IRRIGATION AND DEBRIDEMENT KNEE WITH POLY EXCHANGE;  Surgeon: Samson Frederic, MD;  Location: [facility];  Service: Orthopedics;  Laterality: Left;   INCISION AND DRAINAGE OF WOUND Left 06/04/2022   Procedure: IRRIGATION AND DEBRIDEMENT KNEE;  Surgeon: Samson Frederic, MD;  Location: [facility];  Service: Orthopedics;  Laterality: Left;  60   INCISION AND DRAINAGE OF WOUND Left 06/15/2022   Procedure: excision of knee wound with Myriad;  Surgeon: Peggye Form, DO;  Location: [facility];  Service: Plastics;  Laterality: Left;   ORIF  FEMUR FRACTURE Left 04/10/2021   Procedure: OPEN REDUCTION INTERNAL FIXATION (ORIF) DISTAL FEMUR FRACTURE;  Surgeon: Myrene Galas, MD;  Location: [facility] OR;  Service: Orthopedics;  Laterality: Left;   PARTIAL KNEE ARTHROPLASTY Left 04/30/2022   Procedure: DISTAL FEMUR REPLACEMENT;  Surgeon: Samson Frederic, MD;  Location: [facility];  Service: Orthopedics;  Laterality: Left;  150   Patient Active Problem List   Diagnosis Date Noted   Left femoral vein DVT (HCC) 02/20/2023   GERD (gastroesophageal reflux disease) 02/20/2023   Acute upper gastrointestinal bleeding 02/19/2023   Bipolar 1 disorder, depressed, partial remission (HCC) 11/18/2022   Left above-knee amputee (HCC) 11/13/2022   Infection of total knee replacement (HCC) 11/04/2022   S/P AKA (above knee amputation) unilateral, left (HCC) 11/04/2022   Chronic hepatitis C with cirrhosis (HCC) 06/06/2022   Surgical wound dehiscence, initial encounter 06/04/2022   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, left, with nonunion, subsequent encounter 04/30/2022   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 04/30/2022   Hypokalemia 04/16/2021   Hip fracture requiring operative repair (HCC) 04/16/2021   Vitamin D deficiency 04/12/2021   Hyponatremia 04/11/2021   Acute postoperative anemia due to expected blood loss 04/11/2021   Class 1 obesity 04/11/2021   Left comminuted and displaced fracture of the distal femur metaphysis 04/08/2021   History of CVA (cerebrovascular accident) 04/08/2021   Genetic testing 07/17/2020   Family history of breast cancer 07/10/2020   Family history of ovarian cancer 07/10/2020   Family history of colon cancer 07/10/2020   Malignant neoplasm of upper-outer quadrant of left breast in female, estrogen receptor positive (HCC) 07/09/2020   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   Chronic pain syndrome 10/06/2012   Nicotine dependence 10/06/2012   Anxiety  state 10/05/2012   Bipolar disorder, unspecified (HCC) 10/05/2012   Slurred speech 10/04/2012   Left-sided weakness 10/04/2012   Hyperkalemia 10/04/2012   Acute CVA (cerebrovascular accident) (HCC) 10/04/2012   Essential hypertension     PCP: Quitman Livings, MD  REFERRING PROVIDER: Aldean Baker, MD  ONSET DATE: 01/25/2023 prosthesis delivery  REFERRING DIAG: W09.811 (ICD-10-CM) - Hx of AKA (above knee amputation), left   THERAPY DIAG:  Other abnormalities of gait and mobility  Unsteadiness on feet  Muscle weakness (generalized)  History of falling  Chronic pain of right knee  Abnormal posture  Rationale for Evaluation and Treatment: Rehabilitation  SUBJECTIVE:   SUBJECTIVE STATEMENT: Patient states she feels better and less sore from her fall the other day. Patient reports that she can do more activities since starting PT but feels she needs more PT to be able to fully become functional with her prosthesis. She has had numerous falls in last few weeks.  Pt accompanied by: [name],  significant other  PERTINENT HISTORY: AKA, anemia, anxiety, arthritis, Bipolar disorder, Breast CA left with radiation, HTN, Hep C, liver cirrhosis, Neuromuscular disorder, osteopenia, stroke Left hemiparesis 2014  PAIN:  Are you having pain? Yes, 1/10 in left leg, 5/10 on Right side after fall last week Pain location: residual limb Pain description: soreness Aggravating factors: standing or walking too much, or twisting motion Relieving factors: resting  PRECAUTIONS: Fall  WEIGHT BEARING RESTRICTIONS: No  FALLS: Has patient fallen in last 6 months? Yes. Number of falls 1 no injuries  LIVING ENVIRONMENT: Lives with: lives with their spouse Lives in: Mobile home Home Access: Ramped entrance or 4 steps with double rail to [employer] layout: One level Has following equipment at home: Single point cane, Environmental consultant - 2 wheeled, Wheelchair (manual), Shower bench, bed side commode, Grab bars, and  Ramped entry  OCCUPATION: retired from nursing  PLOF: Independent prior to Fracture 04/10/2021.  Since fracture was able to get to cane for community until hardware broke.    PATIENT GOALS:  walk independently in community, travel, drive, take care of 4yo great granddaughter  OBJECTIVE:  COGNITION: Evaluation on 01/25/2023:  Overall cognitive status: Within functional limits for tasks assessed  POSTURE:  04/21/2023:  standing with rounded shoulders, forward head, flexed trunk , and weight shift right  Evaluation on 01/25/2023: rounded shoulders, forward head, flexed trunk , and weight shift right  LOWER EXTREMITY ROM:  ROM P:passive  A:active Right eval Left eval  Hip flexion    Hip extension    Hip abduction    Hip adduction    Hip internal rotation    Hip external rotation    Knee flexion    Knee extension    Ankle dorsiflexion    Ankle plantarflexion    Ankle inversion    Ankle eversion     (Blank rows = not tested)  LOWER EXTREMITY MMT:  MMT Right eval Left eval  Hip flexion    Hip extension    Hip abduction    Hip adduction    Hip internal rotation    Hip external rotation    Knee flexion    Knee extension    Ankle dorsiflexion    Ankle plantarflexion    Ankle inversion    Ankle eversion    At Evaluation all strength testing is grossly seated and functionally standing / gait. (Blank rows = not tested)  TRANSFERS: 04/21/2023:  Sit to stand: SBA w/c to RW requires without armrests pushing off w/c seat and stabilization without UE support but RW close for safety.   Stand to sit: SBA RW to position feet, then able to sit down using armrests on w/c to control descent.  Evaluation on 01/25/2023:  Sit to stand: SBA w/c to RW requires armrests on w/c and stabilization with RW.   Stand to sit: SBA RW to w/c requires armrests on w/c and stabilization with RW.    FUNCTIONAL TESTs:  04/21/2023: TUG with RW: 58.28sec Berg balance: 28/56   [facility] PT Assessment -  04/21/23 1027       Standardized Balance Assessment   Standardized Balance Assessment Berg Balance Test      Berg Balance Test   Sit to Stand Able to stand  independently using hands    Standing Unsupported Able to stand 2 minutes with supervision    Sitting with Back Unsupported but Feet Supported on Floor or Stool Able to sit safely and securely 2 minutes    Stand to Sit  Controls descent by using hands    Transfers Able to transfer safely, minor use of hands    Standing Unsupported with Eyes Closed Able to stand 10 seconds with supervision    Standing Unsupported with Feet Together Able to place feet together independently and stand for 1 minute with supervision    From Standing, Reach Forward with Outstretched Arm Reaches forward but needs supervision    From Standing Position, Pick up Object from Floor Able to pick up shoe, needs supervision    From Standing Position, Turn to Look Behind Over each Shoulder Needs assist to keep from losing balance and falling    Turn 360 Degrees Needs assistance while turning    Standing Unsupported, Alternately Place Feet on Step/Stool Needs assistance to keep from falling or unable to try    Standing Unsupported, One Foot in Front Needs help to step but can hold 15 seconds    Standing on One Leg Unable to try or needs assist to prevent fall    Total Score 28    Berg comment: Group Information    BERG  < 36 high risk for falls (close to 100%) 46-51 moderate (>50%)   37-45 significant (>80%) 52-55 lower (> 25%)            03/02/2023: Sharlene Motts Balance 22/56  Eval: Berg Balance Scale: 15/56   GAIT: 04/20/2023: gait velocity with RW: 0.51 ft/sec Pt able to amb with RW & Transfemoral prosthesis with supervision / requiring verbal cues for gait deviations.    03/18/2023: -Patient ambulated 177ft and performing a 180deg turn CGA for patient safety with intermittent SBA  01/26/2023: Patient ambulated 10 feet with rolling walker with minA but required  constant cueing for technique and safety.  PALPATION: 03/18/2023: patient verbalized seeing areas of discoloration on residual limb a few days prior. Therapist palpation and examination of area, did not see any noticeable areas with patient stating that the areas must have gone away without issue.    CURRENT PROSTHETIC WEAR ASSESSMENT: 04/21/2023:  Patient is independent with: skin check, residual limb care, care of non-amputated limb, prosthetic cleaning, ply sock cleaning, correct ply sock adjustment, proper wear schedule/adjustment, and proper weight-bearing schedule/adjustment Donning prosthesis: modified independent Doffing prosthesis: modified independent Prosthetic wear tolerance: tolerated wear of prosthesis daily 6-7 hours but has decreased over last few days due to limb pain with recent fall.  Prosthetic weight bearing: pt tolerated standing 10 min for Berg Balance with partial weight on prosthesis.   Evaluation on 01/25/2023:  Patient is dependent with: skin check, residual limb care, care of non-amputated limb, prosthetic cleaning, ply sock cleaning, correct ply sock adjustment, proper wear schedule/adjustment, and proper weight-bearing schedule/adjustment Donning prosthesis: Min A Doffing prosthesis: SBA Prosthetic wear tolerance: tolerated wear 1 hour prior to PT evaluation.  Prosthetic weight bearing tolerance: 5 minutes with BUE support on RW with limited wear on prosthesis.  Edema: pitting Prosthetic description: Ischial Containment socket with flexible inner socket, silicon liner with velcro strap suspension.    TODAY'S TREATMENT:  DATE:  04/21/2023: Physical performance testing: See above for results    TREATMENT:                                                                                                                             DATE:   04/20/2023: Prosthetic Training with Transfemoral Prosthesis: Ambulated 62ft with turn with lofstrand crutches modAx2 for safety and cuing of patient; required heavy cues for sequencing as well as demo.   STS with lofstrand crutches vc for sequencing modA  Patient ambulated 152ft with RW CGA with intermittent SBA; heavy cuing for progression of walker, swing through with RLE, and increased stance time on LLE. Patient progressed stance time on LLE as well as step through pattern.  STS from 18in chair without armrest using RW with minA   TREATMENT:                                                                                                                             DATE:  04/12/2023: Prosthetic Training with Transfemoral Prosthesis: Ambulated 20' & 40' with RW CGA to SBA as patient demonstrated increased safety.   Therapeutic Activities: Patient was educated with demo & verbal cues on floor transfer with safety for instances of falls via demo and walk through of sequencing. Patient performed transfer with BUEs on 18" chair seat to half kneeling with PT maxA.  Pt had limitation WB on prosthetic knee due left hip ext ROM and weakness in BLEs.    Neuromuscular Re-education working on posture and balance: Hip flexor stretch supine hooklying 2x30sec hold Hip flexor stretch supine with knee to chest 2x30sec hold; vc for sequencing and maintaining knee to chest before lowering LLE Seated hamstring stretch 2x30sec; vc for proper positioning Standing counter stretch 2x30sec; vc and education for proper posture Upright stance at doorway x2 ea UE with 2 breaths then BUE x2 breaths with RW in front of patient for safety; vc for sequencing to achieve LE positioning before backing up to door frame  Patient educated on updated HEP verbalizing understanding   TREATMENT:  DATE:   04/07/2023: Prosthetic Training with Transfemoral Prosthesis: Patient ambulated 60 feet with RW and CGA Ambulation then progressed to 4pt gait with lofstrand crutches, she needed max cues and demo for proper sequence. Performed 15 feet X 2 with min A for balance and wheelchair follow from fiance for safety.    TREATMENT:                                                                                                                             DATE:  03/30/2023: Therex: Nu step seat 9, level 3 for endurance, . Educated on increasing endurance with recumbent stepper or bike at [facility] so that she is able to walk into buildings instead of being pushed in wc. Pt verbalized general understanding.   While on nustep, educated on transport chair and its use, use as a rest point if ambulating longer distances at home, proper specs for ease of mobility and reduction of caretaker burden. Pt verbalized understanding.     Prosthetic Training with Transfemoral Prosthesis: Patient ambulated 108ft with RW SBA with step-to gait. PT verbal cues to facilitate step through pattern to facilitate prosthesis control.   STS with RW from 18in chair with arm rest SBA with patient demonstrating increased confidence and safety  Neuro: Standing balance with blue TB and RW support/RW in front of patient if patient were to need it:  BUEs rows x10 ea unilateral/bilateral/alternating; CGA throughout with a few instances of patient requiring RW assist to regain balance.  UE punches at 90deg x10 ea unilateral with RW support then without UE support with patient demonstrating good weight shifting, Few instances of patient needing to regain balance with RW. CGA for safety; vc for proper foot placement for more secure balance  Unilateral UE shoulder extension Green TB with UE support X 10 reps with tactile cues for balance and verbal cues on technique.  Unilateral UE tricep extension green TB with UE support X 10 reps with  tactile cues for balance and verbal cues on technique.    HOME EXERCISE PROGRAM: Access Code: [email or web address] URL: [email or web address] Date: 04/12/2023 Prepared by: Vladimir Faster   Exercises - Seated Table Hamstring Stretch  - 1-3 x daily - 7 x weekly - 1 sets - 2-3 reps - 20-30 seconds hold - Modified Thomas Stretch  - 2 x daily - 7 x weekly - 1 sets - 2-3 reps - 30 seconds hold - Hip Flexor Stretch at [employer] of Bed  - 2 x daily - 7 x weekly - 1 sets - 2-3 reps - 30 seconds hold - Standing posture with back to counter  - 2 x daily - 7 x weekly - 1 sets - 1 reps - 5-10 minutes hold - Upright Stance at Door Frame Single Arm  - 3-6 x daily - 7 x weekly - 1 sets - 2 reps - 2 deep breathes hold - Upright Stance at Door Frame with Both Arms  - 3-6 x  daily - 7 x weekly - 1 sets - 2 reps - 2 deep breathes hold [place]  Patient Portal      Do each exercise 1-2  times per day Do each exercise 5-10 repetitions Hold each exercise for 2 seconds to feel your location  AT SINK FIND YOUR MIDLINE POSITION AND PLACE FEET EQUAL DISTANCE FROM THE MIDLINE.  Try to find this position when standing still for activities.   USE TAPE ON FLOOR TO MARK THE MIDLINE POSITION which is even with middle of sink.  You also should try to feel with your limb pressure in socket.  You are trying to feel with limb what you used to feel with the bottom of your foot.  Side to Side Shift: Moving your hips only (not shoulders): move weight onto your left leg, HOLD/FEEL pressure in socket.  Move back to equal weight on each leg, HOLD/FEEL pressure in socket. Move weight onto your right leg, HOLD/FEEL pressure in socket. Move back to equal weight on each leg, HOLD/FEEL pressure in socket. Repeat.  Start with both hands on sink, progress to hand on prosthetic side only, then no hands.  Front to Back Shift: Moving your hips only (not shoulders): move your weight forward onto your toes, HOLD/FEEL pressure in socket.  Move your weight back to equal Flat Foot on both legs, HOLD/FEEL  pressure in socket. Move your weight back onto your heels, HOLD/FEEL  pressure in socket. Move your weight back to equal on both legs, HOLD/FEEL  pressure in socket. Repeat.  Start with both hands on sink, progress to hand on prosthetic side only, then no hands.  Moving Cones / Cups: With equal weight on each leg: Hold on with one hand the first time, then progress to no hand supports. Move cups from one side of sink to the other. Place cups ~2" out of your reach, progress to 10" beyond reach.  Place one hand in middle of sink and reach with other hand. Do both arms.  Then hover one hand and move cups with other hand.  Overhead/Upward Reaching: alternated reaching up to top cabinets or ceiling if no cabinets present. Keep equal weight on each leg. Start with one hand support on counter while other hand reaches and progress to no hand support with reaching.  ace one hand in middle of sink and reach with other hand. Do both arms.  Then hover one hand and move cups with other hand.  5.   Looking Over Shoulders: With equal weight on each leg: alternate turning to look over your shoulders with one hand support on counter as needed.  Start with head motions only to look in front of shoulder, then even with shoulder and progress to looking behind you. To look to side, move head /eyes, then shoulder on side looking pulls back, shift more weight to side looking and pull hip back. Place one hand in middle of sink and let go with other hand so your shoulder can pull back. Switch hands to look other way.   Then hover one hand and look over shoulder. If looking right, use left hand at sink. If looking left, use right hand at sink. 6.  Stepping with leg that is not amputated:  Move items under cabinet out of your way. Shift your hips/pelvis so weight on prosthesis. Tighten muscles in hip on prosthetic side.  SLOWLY step other leg so front of foot is in cabinet.  Then step back to floor.  ASSESSMENT:  CLINICAL IMPRESSION: Patient has made significant progress with PT.  She was w/c bound for household and community activities prior to PT evaluation and now is ambulating for most of household activities and some community.  Patient performed Berg Balance testing and scored 6pts higher than last attempt and 13pts higher than initial eval demonstrating improved function with physical therapy. However score still indicates a fall risk. Patient continues to demonstrate balance deficits as seen during testing and with patient's reports of falls. Patient TUG rating is indicative of high falls risk. Since eval patient has demonstrated improved balance progressing from ability to stand without therapist assist to using resistance bands with CGA to SBA. Patient will benefit from continued skilled physical therapy to address deficits in balance, gait, strength, and overall function of prosthesis.   OBJECTIVE IMPAIRMENTS: Abnormal gait, decreased activity tolerance, decreased balance, decreased endurance, decreased knowledge of condition, decreased knowledge of use of DME, decreased mobility, difficulty walking, decreased ROM, decreased strength, increased edema, impaired flexibility, postural dysfunction, prosthetic dependency , obesity, and pain.   ACTIVITY LIMITATIONS: carrying, lifting, bending, sitting, standing, stairs, transfers, and locomotion level  PARTICIPATION LIMITATIONS: meal prep, cleaning, and community activity  PERSONAL FACTORS: Fitness, Time since onset of injury/illness/exacerbation, and 3+ comorbidities: see PMH  are also affecting patient's functional outcome.   REHAB POTENTIAL: Good  CLINICAL DECISION MAKING: Evolving/moderate complexity  EVALUATION COMPLEXITY: Moderate   GOALS: Goals reviewed with patient? Yes  SHORT TERM GOALS: Target date: 05/20/2023  Patient reports no falls for 2 week time period.  Baseline: SEE OBJECTIVE  DATA Goal status: SET 04/21/2023 2.  Patient reports tolerating prosthesis >12 hrs total /day without skin issues or limb pain after standing. Baseline: SEE OBJECTIVE DATA Goal status: SET 04/21/2023  3.  Berg Balance >/= 32/56 Baseline: SEE OBJECTIVE DATA Goal status: SET 04/21/2023  4. Patient ambulates 150' with RW & prosthesis with supervision / verbal cues only for gait deviations. Baseline: SEE OBJECTIVE DATA Goal status: SET 04/21/2023  5. Patient negotiates ramps & curbs with RW & prosthesis with supervision. Baseline: SEE OBJECTIVE DATA Goal status: SET 04/21/2023  LONG TERM GOALS: Target date: 04/22/2023  Patient demonstrates & verbalized understanding of prosthetic care to enable safe utilization of prosthesis. Baseline: SEE OBJECTIVE DATA Goal status: Partially MET   04/21/2023  Patient tolerates prosthesis wear >90% of awake hours without skin or residual limb pain issues. Baseline: SEE OBJECTIVE DATA Goal status: Progressing but not met 04/21/2023  Sharlene Motts Balance >35/56 to indicate lower fall risk Baseline: SEE OBJECTIVE DATA Goal status: Improved but not met 04/21/2023  Patient ambulates >300' with prosthesis and cane or less modified independent. Baseline: SEE OBJECTIVE DATA Goal status: NOT MET 04/21/2023  Patient negotiates ramps, curbs & stairs with single rail with prosthesis  and cane or less modified independent. Baseline: SEE OBJECTIVE DATA Goal status: NOT MET 04/21/2023   Patient reports right knee pain </= 2/10 with standing and gait activities.   Baseline: SEE OBJECTIVE DATA Goal status:   Partially MET 04/21/2023  UPDATED LONG TERM GOALS: Target date: 07/20/2023  Patient demonstrates & verbalized understanding of prosthetic care to enable safe utilization of prosthesis. Baseline: SEE OBJECTIVE DATA Goal status: Ongoing   04/21/2023  Patient tolerates prosthesis wear >90% of awake hours without skin or residual limb pain issues. Baseline: SEE OBJECTIVE  DATA Goal status: Ongoing    04/21/2023  Berg Balance >36/56 to indicate lower fall risk Baseline: SEE OBJECTIVE DATA Goal status: Ongoing  04/21/2023  Patient ambulates >300' with prosthesis and LRAD modified independent. Baseline: SEE OBJECTIVE DATA Goal status: Ongoing   04/21/2023  Patient negotiates ramps, curbs & stairs with single rail with prosthesis  and LRAD modified independent. Baseline: SEE OBJECTIVE DATA Goal status: Ongoing   04/21/2023   Patient reports right knee pain </= 2/10 with standing and gait activities.   Baseline: SEE OBJECTIVE DATA Goal status:   Ongoing   04/21/2023  PLAN:  PT FREQUENCY:  2x/wk  PT DURATION: 90 days / 13 weeks  PLANNED INTERVENTIONS: 97164- PT Re-evaluation, 97110-Therapeutic exercises, 97530- Therapeutic activity, 97112- Neuromuscular re-education, 97535- Self Care, 40981- Gait training, [phone]- Prosthetic training, Patient/Family education, Balance training, Stair training, DME instructions, and physical performance testing  PLAN FOR NEXT SESSION:  check hip ext standing ROM, aerobic step lateral at sink, LE strengthening- leg press, LAQ weighted, HSC weighted. Continue standing balance activities, work on turning fluidity for reduction of falls    Ashleyanne Hemmingway, Shanora Christensen, Student-PT 04/21/2023, 11:09 AM  This entire session of physical therapy was performed under the direct supervision of PT signing evaluation /treatment. PT reviewed note and agrees.   Vladimir Faster, PT, DPT 04/21/2023, 3:02 PM   Date of referral: 12/22/2022 Referring provider: Aldean Baker, MD Referring diagnosis? W29.562 (ICD-10-CM) - Hx of AKA (above knee amputation), left  Treatment diagnosis? (if different than referring diagnosis)  Other abnormalities of gait and mobility  ICD-10-CM: R26.89    Unsteadiness on feet  ICD-10-CM: R26.81    Muscle weakness (generalized)  ICD-10-CM: M62.81    Chronic pain of right knee  M25.561 & G89.29   History of falling  Z91.81  Abnormal Posture  R29.3   What was this (referring dx) caused by? Surgery (Type: AKA)   Nature of Condition: Initial Onset (within last 3 months) for prosthesis delivery              Laterality: Lt   Current Functional Measure Score: Other 04/21/2023 Sharlene Motts Balance 28/56  03/02/2023: Sharlene Motts Balance 22/56  at eval Berg was 15/56   Objective measurements identify impairments when they are compared to normal values, the uninvolved extremity, and prior level of function.             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  She has improved wear & care with PT education.  Her balance and gait continue to require skilled PT instruction for safety and function.    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     Average pain intensity:             Last 24 hours: 1-5/10 (higher due to recent fall with contusion)             Past week: 1-5/10   How often does the pt experience symptoms? Constantly   How much have the symptoms interfered with usual daily activities? Extremely   How has condition changed since care began at this facility? Better   In general, how is the patients overall health? Very Good     BACK PAIN (STarT Back Screening Tool) No

## 2023-05-03 ENCOUNTER — Encounter: Payer: Self-pay | Admitting: Physical Therapy

## 2023-05-03 ENCOUNTER — Ambulatory Visit (INDEPENDENT_AMBULATORY_CARE_PROVIDER_SITE_OTHER): Admitting: Physical Therapy

## 2023-05-03 DIAGNOSIS — M6281 Muscle weakness (generalized): Secondary | ICD-10-CM | POA: Diagnosis not present

## 2023-05-03 DIAGNOSIS — G8929 Other chronic pain: Secondary | ICD-10-CM

## 2023-05-03 DIAGNOSIS — R2689 Other abnormalities of gait and mobility: Secondary | ICD-10-CM | POA: Diagnosis not present

## 2023-05-03 DIAGNOSIS — Z9181 History of falling: Secondary | ICD-10-CM | POA: Diagnosis not present

## 2023-05-03 DIAGNOSIS — R2681 Unsteadiness on feet: Secondary | ICD-10-CM

## 2023-05-03 DIAGNOSIS — R293 Abnormal posture: Secondary | ICD-10-CM

## 2023-05-03 DIAGNOSIS — M25561 Pain in right knee: Secondary | ICD-10-CM

## 2023-05-03 NOTE — Therapy (Cosign Needed)
 OUTPATIENT PHYSICAL THERAPY PROSTHETIC TREATMENT   Patient Name: Tammy [name] MRN: [id number] DOB:[birth year], 68 y.o., female Today's Date: 05/03/2023  END OF SESSION:  PT End of Session - 05/03/23 1511     Visit Number 18    Number of Visits 44    Date for PT Re-Evaluation 07/20/23    Authorization Type [employer] Medicare    Authorization Time Period [facility] MEDICARE APPROVED 4 PT VISITS 3/12-05/19/23    Authorization - Visit Number 1    Authorization - Number of Visits 4    Progress Note Due on Visit 27    PT Start Time 1512    PT Stop Time 1600    PT Time Calculation (min) 48 min    Equipment Utilized During Treatment Gait belt    Activity Tolerance Patient tolerated treatment well    Behavior During Therapy WFL for tasks assessed/performed                  Past Medical History:  Diagnosis Date   Anemia    hx of   Anxiety    on meds   Arthritis    back/bilateral ankles   Bipolar disorder (HCC)    Breast cancer (HCC) 2022   LEFT breast-radiation   Depression    on meds   Family history of breast cancer 07/10/2020   Family history of colon cancer 07/10/2020   Family history of ovarian cancer 07/10/2020   GERD (gastroesophageal reflux disease)    on meds   Heart murmur    dx by PCP   Hepatitis C    HTN (hypertension)    on meds   Hx of radiation therapy    Liver cirrhosis (HCC)    from hep c   Neuromuscular disorder (HCC)    Osteopenia    Personal history of radiation therapy    Stroke (HCC) 09/2012   slurred speech and LEFT sided weakness/notes 10/04/2012   Past Surgical History:  Procedure Laterality Date   AMPUTATION Left 11/04/2022   Procedure: LEFT ABOVE KNEE AMPUTATION;  Surgeon: Nadara Mustard, MD;  Location: [facility] OR;  Service: Orthopedics;  Laterality: Left;   ANKLE FRACTURE SURGERY Left 2018   APPLICATION OF WOUND VAC Left 06/15/2022   Procedure: APPLICATION OF WOUND VAC;  Surgeon: Peggye Form, DO;  Location: [facility];  Service:  Plastics;  Laterality: Left;   APPLICATION OF WOUND VAC Left 09/14/2022   Procedure: APPLICATION OF WOUND VAC;  Surgeon: Peggye Form, DO;  Location: [facility] OR;  Service: Plastics;  Laterality: Left;   BREAST LUMPECTOMY Left 07/2020   BREAST LUMPECTOMY WITH RADIOACTIVE SEED AND SENTINEL LYMPH NODE BIOPSY Left 07/26/2020   Procedure: LEFT BREAST LUMPECTOMY WITH RADIOACTIVE SEED AND SENTINEL LYMPH NODE BIOPSY;  Surgeon: Abigail Miyamoto, MD;  Location: [facility];  Service: General;  Laterality: Left;   CESAREAN SECTION  1986   COLONOSCOPY     DIAGNOSTIC LAPAROSCOPY  1985   ESOPHAGOGASTRODUODENOSCOPY N/A 02/21/2023   Procedure: ESOPHAGOGASTRODUODENOSCOPY (EGD);  Surgeon: Hilarie Fredrickson, MD;  Location: [facility];  Service: Gastroenterology;  Laterality: N/A;   HARDWARE REMOVAL Left 04/30/2022   Procedure: HARDWARE REMOVAL LEFT FEMUR;  Surgeon: Samson Frederic, MD;  Location: [facility];  Service: Orthopedics;  Laterality: Left;  150   HARDWARE REMOVAL Left 11/04/2022   Procedure: HARDWARE REMOVAL INFECTED LEFT TOTAL KNEE ARTHROPLASTY;  Surgeon: Nadara Mustard, MD;  Location: [facility] OR;  Service: Orthopedics;  Laterality: Left;   I &  D KNEE WITH POLY EXCHANGE Left 06/07/2022   Procedure: IRRIGATION AND DEBRIDEMENT KNEE WITH POLY EXCHANGE;  Surgeon: Samson Frederic, MD;  Location: [facility];  Service: Orthopedics;  Laterality: Left;   INCISION AND DRAINAGE OF WOUND Left 06/04/2022   Procedure: IRRIGATION AND DEBRIDEMENT KNEE;  Surgeon: Samson Frederic, MD;  Location: [facility];  Service: Orthopedics;  Laterality: Left;  60   INCISION AND DRAINAGE OF WOUND Left 06/15/2022   Procedure: excision of knee wound with Myriad;  Surgeon: Peggye Form, DO;  Location: [facility];  Service: Plastics;  Laterality: Left;   ORIF FEMUR FRACTURE Left 04/10/2021   Procedure: OPEN REDUCTION INTERNAL FIXATION (ORIF) DISTAL FEMUR FRACTURE;  Surgeon: Myrene Galas, MD;  Location: [facility] OR;  Service: Orthopedics;   Laterality: Left;   PARTIAL KNEE ARTHROPLASTY Left 04/30/2022   Procedure: DISTAL FEMUR REPLACEMENT;  Surgeon: Samson Frederic, MD;  Location: [facility];  Service: Orthopedics;  Laterality: Left;  150   Patient Active Problem List   Diagnosis Date Noted   Left femoral vein DVT (HCC) 02/20/2023   GERD (gastroesophageal reflux disease) 02/20/2023   Acute upper gastrointestinal bleeding 02/19/2023   Bipolar 1 disorder, depressed, partial remission (HCC) 11/18/2022   Left above-knee amputee (HCC) 11/13/2022   Infection of total knee replacement (HCC) 11/04/2022   S/P AKA (above knee amputation) unilateral, left (HCC) 11/04/2022   Chronic hepatitis C with cirrhosis (HCC) 06/06/2022   Surgical wound dehiscence, initial encounter 06/04/2022   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, left, with nonunion, subsequent encounter 04/30/2022   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 04/30/2022   Hypokalemia 04/16/2021   Hip fracture requiring operative repair (HCC) 04/16/2021   Vitamin D deficiency 04/12/2021   Hyponatremia 04/11/2021   Acute postoperative anemia due to expected blood loss 04/11/2021   Class 1 obesity 04/11/2021   Left comminuted and displaced fracture of the distal femur metaphysis 04/08/2021   History of CVA (cerebrovascular accident) 04/08/2021   Genetic testing 07/17/2020   Family history of breast cancer 07/10/2020   Family history of ovarian cancer 07/10/2020   Family history of colon cancer 07/10/2020   Malignant neoplasm of upper-outer quadrant of left breast in female, estrogen receptor positive (HCC) 07/09/2020   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   Chronic pain syndrome 10/06/2012   Nicotine dependence 10/06/2012   Anxiety state 10/05/2012   Bipolar disorder, unspecified (HCC) 10/05/2012   Slurred speech 10/04/2012   Left-sided weakness 10/04/2012   Hyperkalemia 10/04/2012   Acute CVA  (cerebrovascular accident) (HCC) 10/04/2012   Essential hypertension     PCP: Quitman Livings, MD  REFERRING PROVIDER: Aldean Baker, MD  ONSET DATE: 01/25/2023 prosthesis delivery  REFERRING DIAG: G95.621 (ICD-10-CM) - Hx of AKA (above knee amputation), left   THERAPY DIAG:  Other abnormalities of gait and mobility  Unsteadiness on feet  Muscle weakness (generalized)  History of falling  Chronic pain of right knee  Abnormal posture  Rationale for Evaluation and Treatment: Rehabilitation  SUBJECTIVE:   SUBJECTIVE STATEMENT: Arrived in transport chair which patient enjoys. Has been trying to walk with step through pattern at home with RW.   Pt accompanied by: [name], significant other  PERTINENT HISTORY: AKA, anemia, anxiety, arthritis, Bipolar disorder, Breast CA left with radiation, HTN, Hep C, liver cirrhosis, Neuromuscular disorder, osteopenia, stroke Left hemiparesis 2014  PAIN:  Are you having pain? Yes, 1/10 in left leg, 3/10 on Right side after fall early March Pain location: residual limb Pain  description: soreness Aggravating factors: standing or walking too much, or twisting motion Relieving factors: resting  PRECAUTIONS: Fall  WEIGHT BEARING RESTRICTIONS: No  FALLS: Has patient fallen in last 6 months? Yes. Number of falls 1 no injuries  LIVING ENVIRONMENT: Lives with: lives with their spouse Lives in: Mobile home Home Access: Ramped entrance or 4 steps with double rail to [employer] layout: One level Has following equipment at home: Single point cane, Environmental consultant - 2 wheeled, Wheelchair (manual), Shower bench, bed side commode, Grab bars, and Ramped entry  OCCUPATION: retired from nursing  PLOF: Independent prior to Fracture 04/10/2021.  Since fracture was able to get to cane for community until hardware broke.    PATIENT GOALS:  walk independently in community, travel, drive, take care of 4yo great granddaughter  OBJECTIVE:  COGNITION: Evaluation on  01/25/2023:  Overall cognitive status: Within functional limits for tasks assessed  POSTURE:  04/21/2023:  standing with rounded shoulders, forward head, flexed trunk , and weight shift right  Evaluation on 01/25/2023: rounded shoulders, forward head, flexed trunk , and weight shift right  LOWER EXTREMITY ROM:  ROM P:passive  A:active Right eval Left eval  Hip flexion    Hip extension  12deg  Hip abduction    Hip adduction    Hip internal rotation    Hip external rotation    Knee flexion    Knee extension    Ankle dorsiflexion    Ankle plantarflexion    Ankle inversion    Ankle eversion     (Blank rows = not tested)  LOWER EXTREMITY MMT:  MMT Right eval Left eval  Hip flexion    Hip extension    Hip abduction    Hip adduction    Hip internal rotation    Hip external rotation    Knee flexion    Knee extension    Ankle dorsiflexion    Ankle plantarflexion    Ankle inversion    Ankle eversion    At Evaluation all strength testing is grossly seated and functionally standing / gait. (Blank rows = not tested)  TRANSFERS: 04/21/2023:  Sit to stand: SBA w/c to RW requires without armrests pushing off w/c seat and stabilization without UE support but RW close for safety.   Stand to sit: SBA RW to position feet, then able to sit down using armrests on w/c to control descent.  Evaluation on 01/25/2023:  Sit to stand: SBA w/c to RW requires armrests on w/c and stabilization with RW.   Stand to sit: SBA RW to w/c requires armrests on w/c and stabilization with RW.    FUNCTIONAL TESTs:  04/21/2023: TUG with RW: 58.28sec Berg balance: 28/56    03/02/2023: Sharlene Motts Balance 22/56  Eval: Berg Balance Scale: 15/56   GAIT: 04/20/2023: gait velocity with RW: 0.51 ft/sec Pt able to amb with RW & Transfemoral prosthesis with supervision / requiring verbal cues for gait deviations.    03/18/2023: -Patient ambulated 163ft and performing a 180deg turn CGA for patient safety with  intermittent SBA  01/26/2023: Patient ambulated 10 feet with rolling walker with minA but required constant cueing for technique and safety.  PALPATION: 03/18/2023: patient verbalized seeing areas of discoloration on residual limb a few days prior. Therapist palpation and examination of area, did not see any noticeable areas with patient stating that the areas must have gone away without issue.    CURRENT PROSTHETIC WEAR ASSESSMENT: 04/21/2023:  Patient is independent with: skin check, residual limb care, care  of non-amputated limb, prosthetic cleaning, ply sock cleaning, correct ply sock adjustment, proper wear schedule/adjustment, and proper weight-bearing schedule/adjustment Donning prosthesis: modified independent Doffing prosthesis: modified independent Prosthetic wear tolerance: tolerated wear of prosthesis daily 6-7 hours but has decreased over last few days due to limb pain with recent fall.  Prosthetic weight bearing: pt tolerated standing 10 min for Berg Balance with partial weight on prosthesis.   Evaluation on 01/25/2023:  Patient is dependent with: skin check, residual limb care, care of non-amputated limb, prosthetic cleaning, ply sock cleaning, correct ply sock adjustment, proper wear schedule/adjustment, and proper weight-bearing schedule/adjustment Donning prosthesis: Min A Doffing prosthesis: SBA Prosthetic wear tolerance: tolerated wear 1 hour prior to PT evaluation.  Prosthetic weight bearing tolerance: 5 minutes with BUE support on RW with limited wear on prosthesis.  Edema: pitting Prosthetic description: Ischial Containment socket with flexible inner socket, silicon liner with velcro strap suspension.    TODAY'S  TREATMENT:                                                                                                                             DATE:  05/03/2023: Prosthetic Training with Transfemoral Prosthesis: -Ambulated 56ft with RW CGA to SBA with cues for  proper sequencing, weight shift and step length -Stand>sit CGA requiring 2 attempts to rise, therapist intervention via vc to position LE correctly before attempt to rise. -Side step ups to right on 4" step at sink vc for placement of LLE; required vc tc and visual aid to properly place LLE slightly behind to reduce chance of knee buckling. Seated rest break required -360* turns at sink with chair back & sink support x2ea direction CW / CCW; vc for sequencing and keeping weight over prosthesis when turning.  -patient lost balance requiring therapist intervention to prevent fall due to patient attempting to turn with cloth in hand on sink. Educated on decreasing instances where ambulating or turning with items in hand so she is able to properly catch herself and prevent falls. Pt verbalized understanding.  Therex: Leg press: 50lb 2x10; 1st bout with head elevated 2nd bout with back flat to increase hip extension range; VC and tc via tapping posteriorly on socket for LLE activation within socket to assist with activity. Rest break between sets.    TREATMENT:  DATE:  04/21/2023: Physical performance testing: See above for results    TREATMENT:                                                                                                                             DATE:  04/20/2023: Prosthetic Training with Transfemoral Prosthesis: Ambulated 50ft with turn with lofstrand crutches modAx2 for safety and cuing of patient; required heavy cues for sequencing as well as demo.   STS with lofstrand crutches vc for sequencing modA  Patient ambulated 175ft with RW CGA with intermittent SBA; heavy cuing for progression of walker, swing through with RLE, and increased stance time on LLE. Patient progressed stance time on LLE as well as step through pattern.  STS from 18in chair without  armrest using RW with minA   TREATMENT:                                                                                                                             DATE:  04/12/2023: Prosthetic Training with Transfemoral Prosthesis: Ambulated 20' & 40' with RW CGA to SBA as patient demonstrated increased safety.   Therapeutic Activities: Patient was educated with demo & verbal cues on floor transfer with safety for instances of falls via demo and walk through of sequencing. Patient performed transfer with BUEs on 18" chair seat to half kneeling with PT maxA.  Pt had limitation WB on prosthetic knee due left hip ext ROM and weakness in BLEs.    Neuromuscular Re-education working on posture and balance: Hip flexor stretch supine hooklying 2x30sec hold Hip flexor stretch supine with knee to chest 2x30sec hold; vc for sequencing and maintaining knee to chest before lowering LLE Seated hamstring stretch 2x30sec; vc for proper positioning Standing counter stretch 2x30sec; vc and education for proper posture Upright stance at doorway x2 ea UE with 2 breaths then BUE x2 breaths with RW in front of patient for safety; vc for sequencing to achieve LE positioning before backing up to door frame  Patient educated on updated HEP verbalizing understanding   HOME EXERCISE PROGRAM: Access Code: [email or web address] URL: [email or web address] Date: 04/12/2023 Prepared by: Vladimir Faster   Exercises - Seated Table Hamstring Stretch  - 1-3 x daily - 7 x weekly - 1 sets - 2-3 reps - 20-30 seconds hold - Modified Thomas Stretch  - 2 x daily - 7 x weekly - 1 sets - 2-3 reps -  30 seconds hold - Hip Flexor Stretch at Edge of Bed  - 2 x daily - 7 x weekly - 1 sets - 2-3 reps - 30 seconds hold - Standing posture with back to counter  - 2 x daily - 7 x weekly - 1 sets - 1 reps - 5-10 minutes hold - Upright Stance at Door Frame Single Arm  - 3-6 x daily - 7 x weekly - 1 sets - 2 reps - 2 deep breathes hold -  Upright Stance at Door Frame with Both Arms  - 3-6 x daily - 7 x weekly - 1 sets - 2 reps - 2 deep breathes hold [place]  Patient Portal      Do each exercise 1-2  times per day Do each exercise 5-10 repetitions Hold each exercise for 2 seconds to feel your location  AT SINK FIND YOUR MIDLINE POSITION AND PLACE FEET EQUAL DISTANCE FROM THE MIDLINE.  Try to find this position when standing still for activities.   USE TAPE ON FLOOR TO MARK THE MIDLINE POSITION which is even with middle of sink.  You also should try to feel with your limb pressure in socket.  You are trying to feel with limb what you used to feel with the bottom of your foot.  Side to Side Shift: Moving your hips only (not shoulders): move weight onto your left leg, HOLD/FEEL pressure in socket.  Move back to equal weight on each leg, HOLD/FEEL pressure in socket. Move weight onto your right leg, HOLD/FEEL pressure in socket. Move back to equal weight on each leg, HOLD/FEEL pressure in socket. Repeat.  Start with both hands on sink, progress to hand on prosthetic side only, then no hands.  Front to Back Shift: Moving your hips only (not shoulders): move your weight forward onto your toes, HOLD/FEEL pressure in socket. Move your weight back to equal Flat Foot on both legs, HOLD/FEEL  pressure in socket. Move your weight back onto your heels, HOLD/FEEL  pressure in socket. Move your weight back to equal on both legs, HOLD/FEEL  pressure in socket. Repeat.  Start with both hands on sink, progress to hand on prosthetic side only, then no hands.  Moving Cones / Cups: With equal weight on each leg: Hold on with one hand the first time, then progress to no hand supports. Move cups from one side of sink to the other. Place cups ~2" out of your reach, progress to 10" beyond reach.  Place one hand in middle of sink and reach with other hand. Do both arms.  Then hover one hand and move cups with other hand.  Overhead/Upward Reaching:  alternated reaching up to top cabinets or ceiling if no cabinets present. Keep equal weight on each leg. Start with one hand support on counter while other hand reaches and progress to no hand support with reaching.  ace one hand in middle of sink and reach with other hand. Do both arms.  Then hover one hand and move cups with other hand.  5.   Looking Over Shoulders: With equal weight on each leg: alternate turning to look over your shoulders with one hand support on counter as needed.  Start with head motions only to look in front of shoulder, then even with shoulder and progress to looking behind you. To look to side, move head /eyes, then shoulder on side looking pulls back, shift more weight to side looking and pull hip back. Place one hand in middle  of sink and let go with other hand so your shoulder can pull back. Switch hands to look other way.   Then hover one hand and look over shoulder. If looking right, use left hand at sink. If looking left, use right hand at sink. 6.  Stepping with leg that is not amputated:  Move items under cabinet out of your way. Shift your hips/pelvis so weight on prosthesis. Tighten muscles in hip on prosthetic side.  SLOWLY step other leg so front of foot is in cabinet. Then step back to floor.    ASSESSMENT:  CLINICAL IMPRESSION: Patient did well with activity however once fatigue sets in along with patient attempting to do activity while holding towel in hand, patient became unsafe requiring therapist intervention and education. Demonstrates good weight shift however requires heavy vc for sequencing especially when prosthesis leg has to leave floor when turning. Patient will benefit from continued skilled physical therapy to address deficits and improve overall function of prosthesis.   OBJECTIVE IMPAIRMENTS: Abnormal gait, decreased activity tolerance, decreased balance, decreased endurance, decreased knowledge of condition, decreased knowledge of use of DME,  decreased mobility, difficulty walking, decreased ROM, decreased strength, increased edema, impaired flexibility, postural dysfunction, prosthetic dependency , obesity, and pain.   ACTIVITY LIMITATIONS: carrying, lifting, bending, sitting, standing, stairs, transfers, and locomotion level  PARTICIPATION LIMITATIONS: meal prep, cleaning, and community activity  PERSONAL FACTORS: Fitness, Time since onset of injury/illness/exacerbation, and 3+ comorbidities: see PMH  are also affecting patient's functional outcome.   REHAB POTENTIAL: Good  CLINICAL DECISION MAKING: Evolving/moderate complexity  EVALUATION COMPLEXITY: Moderate   GOALS: Goals reviewed with patient? Yes  SHORT TERM GOALS: Target date: 05/20/2023  Patient reports no falls for 2 week time period.  Baseline: SEE OBJECTIVE DATA Goal status: Ongoing  05/03/2023 2.  Patient reports tolerating prosthesis >12 hrs total /day without skin issues or limb pain after standing. Baseline: SEE OBJECTIVE DATA Goal status: Ongoing  05/03/2023  3.  Berg Balance >/= 32/56 Baseline: SEE OBJECTIVE DATA Goal status: Ongoing  05/03/2023  4. Patient ambulates 150' with RW & prosthesis with supervision / verbal cues only for gait deviations. Baseline: SEE OBJECTIVE DATA Goal status: Ongoing  05/03/2023  5. Patient negotiates ramps & curbs with RW & prosthesis with supervision. Baseline: SEE OBJECTIVE DATA Goal status: Ongoing  05/03/2023   UPDATED LONG TERM GOALS: Target date: 07/20/2023  Patient demonstrates & verbalized understanding of prosthetic care to enable safe utilization of prosthesis. Baseline: SEE OBJECTIVE DATA Goal status: Ongoing  05/03/2023  Patient tolerates prosthesis wear >90% of awake hours without skin or residual limb pain issues. Baseline: SEE OBJECTIVE DATA Goal status: Ongoing  05/03/2023  Berg Balance >36/56 to indicate lower fall risk Baseline: SEE OBJECTIVE DATA Goal status: Ongoing  05/03/2023  Patient  ambulates >300' with prosthesis and LRAD modified independent. Baseline: SEE OBJECTIVE DATA Goal status: Ongoing  05/03/2023  Patient negotiates ramps, curbs & stairs with single rail with prosthesis  and LRAD modified independent. Baseline: SEE OBJECTIVE DATA Goal status: Ongoing  05/03/2023   Patient reports right knee pain </= 2/10 with standing and gait activities.   Baseline: SEE OBJECTIVE DATA Goal status:   Ongoing  05/03/2023  PLAN:  PT FREQUENCY:  2x/wk  PT DURATION: 90 days / 13 weeks  PLANNED INTERVENTIONS: 97164- PT Re-evaluation, 97110-Therapeutic exercises, 97530- Therapeutic activity, 97112- Neuromuscular re-education, [phone]- Self Care, 56213- Gait training, [phone]- Prosthetic training, Patient/Family education, Balance training, Stair training, DME instructions, and physical performance testing  PLAN  FOR NEXT SESSION:  work towards STGs,  aerobic step lateral at sink, LE strengthening- leg press, LAQ weighted, HSC weighted. Continue standing balance activities, work on turning fluidity for reduction of falls     Jonell Brumbaugh, Steed Kanaan, Student-PT 05/03/2023, 5:03 PM  This entire session of physical therapy was performed under the direct supervision of PT signing evaluation /treatment. PT reviewed note and agrees.   Vladimir Faster, PT, DPT 05/04/2023, 11:16 AM  Date of referral: 12/22/2022 Referring provider: Aldean Baker, MD Referring diagnosis? I69.629 (ICD-10-CM) - Hx of AKA (above knee amputation), left  Treatment diagnosis? (if different than referring diagnosis)  Other abnormalities of gait and mobility  ICD-10-CM: R26.89    Unsteadiness on feet  ICD-10-CM: R26.81    Muscle weakness (generalized)  ICD-10-CM: M62.81    Chronic pain of right knee  M25.561 & G89.29   History of falling Z91.81  Abnormal Posture  R29.3   What was this (referring dx) caused by? Surgery (Type: AKA)   Nature of Condition: Initial Onset (within last 3 months) for prosthesis  delivery              Laterality: Lt   Current Functional Measure Score: Other 04/21/2023 Sharlene Motts Balance 28/56  03/02/2023: Sharlene Motts Balance 22/56  at eval Berg was 15/56   Objective measurements identify impairments when they are compared to normal values, the uninvolved extremity, and prior level of function.             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  She has improved wear & care with PT education.  Her balance and gait continue to require skilled PT instruction for safety and function.    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     Average pain intensity:             Last 24 hours: 1-5/10 (higher due to recent fall with contusion)             Past week: 1-5/10   How often does the pt experience symptoms? Constantly   How much have the symptoms interfered with usual daily activities? Extremely   How has condition changed since care began at this facility? Better   In general, how is the patients overall health? Very Good     BACK PAIN (STarT Back Screening Tool) No

## 2023-05-05 ENCOUNTER — Ambulatory Visit (INDEPENDENT_AMBULATORY_CARE_PROVIDER_SITE_OTHER): Admitting: Physical Therapy

## 2023-05-05 ENCOUNTER — Encounter: Payer: Self-pay | Admitting: Physical Therapy

## 2023-05-05 DIAGNOSIS — R2689 Other abnormalities of gait and mobility: Secondary | ICD-10-CM

## 2023-05-05 DIAGNOSIS — R2681 Unsteadiness on feet: Secondary | ICD-10-CM

## 2023-05-05 DIAGNOSIS — G8929 Other chronic pain: Secondary | ICD-10-CM

## 2023-05-05 DIAGNOSIS — Z9181 History of falling: Secondary | ICD-10-CM

## 2023-05-05 DIAGNOSIS — M6281 Muscle weakness (generalized): Secondary | ICD-10-CM

## 2023-05-05 DIAGNOSIS — M25561 Pain in right knee: Secondary | ICD-10-CM

## 2023-05-05 DIAGNOSIS — R262 Difficulty in walking, not elsewhere classified: Secondary | ICD-10-CM

## 2023-05-05 DIAGNOSIS — R293 Abnormal posture: Secondary | ICD-10-CM

## 2023-05-05 NOTE — Therapy (Cosign Needed)
 OUTPATIENT PHYSICAL THERAPY PROSTHETIC TREATMENT   Patient Name: Tammy [name] MRN: [id number] DOB:1955-08-02, 68 y.o., female Today's Date: 05/06/2023  END OF SESSION:  PT End of Session - 05/05/23 1518     Visit Number 19    Number of Visits 44    Date for PT Re-Evaluation 07/20/23    Authorization Type [employer] Medicare    Authorization Time Period  [facility] MEDICARE APPROVED 4 PT VISITS 3/12-05/19/23    Authorization - Number of Visits 4    Progress Note Due on Visit 27    PT Start Time 1515    PT Stop Time 1600    PT Time Calculation (min) 45 min    Equipment Utilized During Treatment Gait belt    Activity Tolerance Patient tolerated treatment well    Behavior During Therapy WFL for tasks assessed/performed                   Past Medical History:  Diagnosis Date   Anemia    hx of   Anxiety    on meds   Arthritis    back/bilateral ankles   Bipolar disorder (HCC)    Breast cancer (HCC) 2022   LEFT breast-radiation   Depression    on meds   Family history of breast cancer 07/10/2020   Family history of colon cancer 07/10/2020   Family history of ovarian cancer 07/10/2020   GERD (gastroesophageal reflux disease)    on meds   Heart murmur    dx by PCP   Hepatitis C    HTN (hypertension)    on meds   Hx of radiation therapy    Liver cirrhosis (HCC)    from hep c   Neuromuscular disorder (HCC)    Osteopenia    Personal history of radiation therapy    Stroke (HCC) 09/2012   slurred speech and LEFT sided weakness/notes 10/04/2012   Past Surgical History:  Procedure Laterality Date   AMPUTATION Left 11/04/2022   Procedure: LEFT ABOVE KNEE AMPUTATION;  Surgeon: Nadara Mustard, MD;  Location: [facility] OR;  Service: Orthopedics;  Laterality: Left;   ANKLE FRACTURE SURGERY Left 2018   APPLICATION OF WOUND VAC Left 06/15/2022   Procedure: APPLICATION OF WOUND VAC;  Surgeon: Peggye Form, DO;  Location: [facility];  Service: Plastics;  Laterality: Left;    APPLICATION OF WOUND VAC Left 09/14/2022   Procedure: APPLICATION OF WOUND VAC;  Surgeon: Peggye Form, DO;  Location: [facility] OR;  Service: Plastics;  Laterality: Left;   BREAST LUMPECTOMY Left 07/2020   BREAST LUMPECTOMY WITH RADIOACTIVE SEED AND SENTINEL LYMPH NODE BIOPSY Left 07/26/2020   Procedure: LEFT BREAST LUMPECTOMY WITH RADIOACTIVE SEED AND SENTINEL LYMPH NODE BIOPSY;  Surgeon: Abigail Miyamoto, MD;  Location: [facility];  Service: General;  Laterality: Left;   CESAREAN SECTION  1986   COLONOSCOPY     DIAGNOSTIC LAPAROSCOPY  1985   ESOPHAGOGASTRODUODENOSCOPY N/A 02/21/2023   Procedure: ESOPHAGOGASTRODUODENOSCOPY (EGD);  Surgeon: Hilarie Fredrickson, MD;  Location: [facility];  Service: Gastroenterology;  Laterality: N/A;   HARDWARE REMOVAL Left 04/30/2022   Procedure: HARDWARE REMOVAL LEFT FEMUR;  Surgeon: Samson Frederic, MD;  Location: [facility];  Service: Orthopedics;  Laterality: Left;  150   HARDWARE REMOVAL Left 11/04/2022   Procedure: HARDWARE REMOVAL INFECTED LEFT TOTAL KNEE ARTHROPLASTY;  Surgeon: Nadara Mustard, MD;  Location: [facility] OR;  Service: Orthopedics;  Laterality: Left;   I & D KNEE WITH POLY EXCHANGE Left 06/07/2022  Procedure: IRRIGATION AND DEBRIDEMENT KNEE WITH POLY EXCHANGE;  Surgeon: Samson Frederic, MD;  Location: [facility];  Service: Orthopedics;  Laterality: Left;   INCISION AND DRAINAGE OF WOUND Left 06/04/2022   Procedure: IRRIGATION AND DEBRIDEMENT KNEE;  Surgeon: Samson Frederic, MD;  Location: [facility];  Service: Orthopedics;  Laterality: Left;  60   INCISION AND DRAINAGE OF WOUND Left 06/15/2022   Procedure: excision of knee wound with Myriad;  Surgeon: Peggye Form, DO;  Location: [facility];  Service: Plastics;  Laterality: Left;   ORIF FEMUR FRACTURE Left 04/10/2021   Procedure: OPEN REDUCTION INTERNAL FIXATION (ORIF) DISTAL FEMUR FRACTURE;  Surgeon: Myrene Galas, MD;  Location: [facility] OR;  Service: Orthopedics;  Laterality: Left;   PARTIAL  KNEE ARTHROPLASTY Left 04/30/2022   Procedure: DISTAL FEMUR REPLACEMENT;  Surgeon: Samson Frederic, MD;  Location: [facility];  Service: Orthopedics;  Laterality: Left;  150   Patient Active Problem List   Diagnosis Date Noted   Left femoral vein DVT (HCC) 02/20/2023   GERD (gastroesophageal reflux disease) 02/20/2023   Acute upper gastrointestinal bleeding 02/19/2023   Bipolar 1 disorder, depressed, partial remission (HCC) 11/18/2022   Left above-knee amputee (HCC) 11/13/2022   Infection of total knee replacement (HCC) 11/04/2022   S/P AKA (above knee amputation) unilateral, left (HCC) 11/04/2022   Chronic hepatitis C with cirrhosis (HCC) 06/06/2022   Surgical wound dehiscence, initial encounter 06/04/2022   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, left, with nonunion, subsequent encounter 04/30/2022   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 04/30/2022   Hypokalemia 04/16/2021   Hip fracture requiring operative repair (HCC) 04/16/2021   Vitamin D deficiency 04/12/2021   Hyponatremia 04/11/2021   Acute postoperative anemia due to expected blood loss 04/11/2021   Class 1 obesity 04/11/2021   Left comminuted and displaced fracture of the distal femur metaphysis 04/08/2021   History of CVA (cerebrovascular accident) 04/08/2021   Genetic testing 07/17/2020   Family history of breast cancer 07/10/2020   Family history of ovarian cancer 07/10/2020   Family history of colon cancer 07/10/2020   Malignant neoplasm of upper-outer quadrant of left breast in female, estrogen receptor positive (HCC) 07/09/2020   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   Chronic pain syndrome 10/06/2012   Nicotine dependence 10/06/2012   Anxiety state 10/05/2012   Bipolar disorder, unspecified (HCC) 10/05/2012   Slurred speech 10/04/2012   Left-sided weakness 10/04/2012   Hyperkalemia 10/04/2012   Acute CVA (cerebrovascular accident) (HCC)  10/04/2012   Essential hypertension     PCP: Quitman Livings, MD  REFERRING PROVIDER: Aldean Baker, MD  ONSET DATE: 01/25/2023 prosthesis delivery  REFERRING DIAG: L24.401 (ICD-10-CM) - Hx of AKA (above knee amputation), left   THERAPY DIAG:  Other abnormalities of gait and mobility  Unsteadiness on feet  Muscle weakness (generalized)  History of falling  Chronic pain of right knee  Abnormal posture  Rationale for Evaluation and Treatment: Rehabilitation  SUBJECTIVE:   SUBJECTIVE STATEMENT: States [facility] and got her residual limb recast as the [facility] determined her socket is too big. Has been doing HEP of stretches and posture at home.   Pt accompanied by: [name], significant other  PERTINENT HISTORY: AKA, anemia, anxiety, arthritis, Bipolar disorder, Breast CA left with radiation, HTN, Hep C, liver cirrhosis, Neuromuscular disorder, osteopenia, stroke Left hemiparesis 2014  PAIN:  Are you having pain? No pain Pain location: residual limb Pain description: soreness Aggravating factors: standing or walking too much,  or twisting motion Relieving factors: resting  PRECAUTIONS: Fall  WEIGHT BEARING RESTRICTIONS: No  FALLS: Has patient fallen in last 6 months? Yes. Number of falls 1 no injuries  LIVING ENVIRONMENT: Lives with: lives with their spouse Lives in: Mobile home Home Access: Ramped entrance or 4 steps with double rail to [employer] layout: One level Has following equipment at home: Single point cane, Environmental consultant - 2 wheeled, Wheelchair (manual), Shower bench, bed side commode, Grab bars, and Ramped entry  OCCUPATION: retired from nursing  PLOF: Independent prior to Fracture 04/10/2021.  Since fracture was able to get to cane for community until hardware broke.    PATIENT GOALS:  walk independently in community, travel, drive, take care of 4yo great granddaughter  OBJECTIVE:  COGNITION: Evaluation on 01/25/2023:  Overall cognitive status: Within  functional limits for tasks assessed  POSTURE:  04/21/2023:  standing with rounded shoulders, forward head, flexed trunk , and weight shift right  Evaluation on 01/25/2023: rounded shoulders, forward head, flexed trunk , and weight shift right  LOWER EXTREMITY ROM:  ROM P:passive  A:active Right eval Left eval  Hip flexion    Hip extension  12deg  Hip abduction    Hip adduction    Hip internal rotation    Hip external rotation    Knee flexion    Knee extension    Ankle dorsiflexion    Ankle plantarflexion    Ankle inversion    Ankle eversion     (Blank rows = not tested)  LOWER EXTREMITY MMT:  MMT Right eval Left eval  Hip flexion    Hip extension    Hip abduction    Hip adduction    Hip internal rotation    Hip external rotation    Knee flexion    Knee extension    Ankle dorsiflexion    Ankle plantarflexion    Ankle inversion    Ankle eversion    At Evaluation all strength testing is grossly seated and functionally standing / gait. (Blank rows = not tested)  TRANSFERS: 04/21/2023:  Sit to stand: SBA w/c to RW requires without armrests pushing off w/c seat and stabilization without UE support but RW close for safety.   Stand to sit: SBA RW to position feet, then able to sit down using armrests on w/c to control descent.  Evaluation on 01/25/2023:  Sit to stand: SBA w/c to RW requires armrests on w/c and stabilization with RW.   Stand to sit: SBA RW to w/c requires armrests on w/c and stabilization with RW.    FUNCTIONAL TESTs:  04/21/2023: TUG with RW: 58.28sec Berg balance: 28/56    03/02/2023: Sharlene Motts Balance 22/56  Eval: Berg Balance Scale: 15/56   GAIT: 04/20/2023: gait velocity with RW: 0.51 ft/sec Pt able to amb with RW & Transfemoral prosthesis with supervision / requiring verbal cues for gait deviations.    03/18/2023: -Patient ambulated 161ft and performing a 180deg turn CGA for patient safety with intermittent SBA  01/26/2023: Patient  ambulated 10 feet with rolling walker with minA but required constant cueing for technique and safety.  PALPATION: 03/18/2023: patient verbalized seeing areas of discoloration on residual limb a few days prior. Therapist palpation and examination of area, did not see any noticeable areas with patient stating that the areas must have gone away without issue.    CURRENT PROSTHETIC WEAR ASSESSMENT: 04/21/2023:  Patient is independent with: skin check, residual limb care, care of non-amputated limb, prosthetic cleaning, ply sock cleaning, correct  ply sock adjustment, proper wear schedule/adjustment, and proper weight-bearing schedule/adjustment Donning prosthesis: modified independent Doffing prosthesis: modified independent Prosthetic wear tolerance: tolerated wear of prosthesis daily 6-7 hours but has decreased over last few days due to limb pain with recent fall.  Prosthetic weight bearing: pt tolerated standing 10 min for Berg Balance with partial weight on prosthesis.   Evaluation on 01/25/2023:  Patient is dependent with: skin check, residual limb care, care of non-amputated limb, prosthetic cleaning, ply sock cleaning, correct ply sock adjustment, proper wear schedule/adjustment, and proper weight-bearing schedule/adjustment Donning prosthesis: Min A Doffing prosthesis: SBA Prosthetic wear tolerance: tolerated wear 1 hour prior to PT evaluation.  Prosthetic weight bearing tolerance: 5 minutes with BUE support on RW with limited wear on prosthesis.  Edema: pitting Prosthetic description: Ischial Containment socket with flexible inner socket, silicon liner with velcro strap suspension.    TODAY'S TREATMENT:                                                                                                                             DATE:  05/05/2023: Therex: Knee ext machine RLE 5lb 2x10 Knee flexion machine RLE 10lb 2x10  Prosthetic Training with Transfemoral Prosthesis: -Ambulated CGA with  RW 49ft with minA & verbal / tactile cues on weight shift over prosthesis and upright trunk; -After use of machine, patients leg was sliding off during ambulation causing circumduction and poor control in swing & stance. Her prosthetic liner was 4-6" off limb upon inspection.  She amb 10' to private room with RW with maxA for safety due to significant instability of prosthesis. -when pt donned liner, she did not fully invert the liner leaving air pocket in distal area.  PT demo & verbal cues on proper donning of liner as her current technique can lead to blisters at distal area and poor control of prosthesis.  She had a ~11mm blister at medial aspect of scar with no signs of infection.  Patient educated verbally & HO on knowing when residual limb is sweating with need to dry both limb & liner; proper drying of liner & prosthesis; skin care including lotion only at night cleaning off limb in morning prior to donning & antiperspirant on limb to decrease amount of sweating. Patient returned demo and verbalized understanding but will probably need. Patient provided with handout for sweat and proper donning. -pt amb 68' with RW to w/c with minA & verbal cues as above. PT pointed out difference in control of prosthesis with proper donning.    TREATMENT:  DATE:  05/03/2023: Prosthetic Training with Transfemoral Prosthesis: -Ambulated 87ft with RW CGA to SBA with cues for proper sequencing, weight shift and step length -Stand>sit CGA requiring 2 attempts to rise, therapist intervention via vc to position LE correctly before attempt to rise. -Side step ups to right on 4" step at sink vc for placement of LLE; required vc tc and visual aid to properly place LLE slightly behind to reduce chance of knee buckling. Seated rest break required -360* turns at sink with chair back & sink support x2ea  direction CW / CCW; vc for sequencing and keeping weight over prosthesis when turning.  -patient lost balance requiring therapist intervention to prevent fall due to patient attempting to turn with cloth in hand on sink. Educated on decreasing instances where ambulating or turning with items in hand so she is able to properly catch herself and prevent falls. Pt verbalized understanding.  Therex: Leg press: 50lb 2x10; 1st bout with head elevated 2nd bout with back flat to increase hip extension range; VC and tc via tapping posteriorly on socket for LLE activation within socket to assist with activity. Rest break between sets.    TREATMENT:                                                                                                                             DATE:  04/21/2023: Physical performance testing: See above for results    TREATMENT:                                                                                                                             DATE:  04/20/2023: Prosthetic Training with Transfemoral Prosthesis: Ambulated 26ft with turn with lofstrand crutches modAx2 for safety and cuing of patient; required heavy cues for sequencing as well as demo.   STS with lofstrand crutches vc for sequencing modA  Patient ambulated 174ft with RW CGA with intermittent SBA; heavy cuing for progression of walker, swing through with RLE, and increased stance time on LLE. Patient progressed stance time on LLE as well as step through pattern.  STS from 18in chair without armrest using RW with minA   TREATMENT:  DATE:  04/12/2023: Prosthetic Training with Transfemoral Prosthesis: Ambulated 20' & 18' with RW CGA to SBA as patient demonstrated increased safety.   Therapeutic Activities: Patient was educated with demo & verbal cues on floor transfer with safety for  instances of falls via demo and walk through of sequencing. Patient performed transfer with BUEs on 18" chair seat to half kneeling with PT maxA.  Pt had limitation WB on prosthetic knee due left hip ext ROM and weakness in BLEs.    Neuromuscular Re-education working on posture and balance: Hip flexor stretch supine hooklying 2x30sec hold Hip flexor stretch supine with knee to chest 2x30sec hold; vc for sequencing and maintaining knee to chest before lowering LLE Seated hamstring stretch 2x30sec; vc for proper positioning Standing counter stretch 2x30sec; vc and education for proper posture Upright stance at doorway x2 ea UE with 2 breaths then BUE x2 breaths with RW in front of patient for safety; vc for sequencing to achieve LE positioning before backing up to door frame  Patient educated on updated HEP verbalizing understanding   HOME EXERCISE PROGRAM: Access Code: [email or web address] URL: [email or web address] Date: 04/12/2023 Prepared by: Vladimir Faster   Exercises - Seated Table Hamstring Stretch  - 1-3 x daily - 7 x weekly - 1 sets - 2-3 reps - 20-30 seconds hold - Modified Thomas Stretch  - 2 x daily - 7 x weekly - 1 sets - 2-3 reps - 30 seconds hold - Hip Flexor Stretch at [place] of Bed  - 2 x daily - 7 x weekly - 1 sets - 2-3 reps - 30 seconds hold - Standing posture with back to counter  - 2 x daily - 7 x weekly - 1 sets - 1 reps - 5-10 minutes hold - Upright Stance at Door Frame Single Arm  - 3-6 x daily - 7 x weekly - 1 sets - 2 reps - 2 deep breathes hold - Upright Stance at Door Frame with Both Arms  - 3-6 x daily - 7 x weekly - 1 sets - 2 reps - 2 deep breathes hold [place]  Patient Portal      Do each exercise 1-2  times per day Do each exercise 5-10 repetitions Hold each exercise for 2 seconds to feel your location  AT SINK FIND YOUR MIDLINE POSITION AND PLACE FEET EQUAL DISTANCE FROM THE MIDLINE.  Try to find this position when standing still for activities.    USE TAPE ON FLOOR TO MARK THE MIDLINE POSITION which is even with middle of sink.  You also should try to feel with your limb pressure in socket.  You are trying to feel with limb what you used to feel with the bottom of your foot.  Side to Side Shift: Moving your hips only (not shoulders): move weight onto your left leg, HOLD/FEEL pressure in socket.  Move back to equal weight on each leg, HOLD/FEEL pressure in socket. Move weight onto your right leg, HOLD/FEEL pressure in socket. Move back to equal weight on each leg, HOLD/FEEL pressure in socket. Repeat.  Start with both hands on sink, progress to hand on prosthetic side only, then no hands.  Front to Back Shift: Moving your hips only (not shoulders): move your weight forward onto your toes, HOLD/FEEL pressure in socket. Move your weight back to equal Flat Foot on both legs, HOLD/FEEL  pressure in socket. Move your weight back onto your heels, HOLD/FEEL  pressure in socket. Move your weight back to equal on both  legs, HOLD/FEEL  pressure in socket. Repeat.  Start with both hands on sink, progress to hand on prosthetic side only, then no hands.  Moving Cones / Cups: With equal weight on each leg: Hold on with one hand the first time, then progress to no hand supports. Move cups from one side of sink to the other. Place cups ~2" out of your reach, progress to 10" beyond reach.  Place one hand in middle of sink and reach with other hand. Do both arms.  Then hover one hand and move cups with other hand.  Overhead/Upward Reaching: alternated reaching up to top cabinets or ceiling if no cabinets present. Keep equal weight on each leg. Start with one hand support on counter while other hand reaches and progress to no hand support with reaching.  ace one hand in middle of sink and reach with other hand. Do both arms.  Then hover one hand and move cups with other hand.  5.   Looking Over Shoulders: With equal weight on each leg: alternate turning to look over  your shoulders with one hand support on counter as needed.  Start with head motions only to look in front of shoulder, then even with shoulder and progress to looking behind you. To look to side, move head /eyes, then shoulder on side looking pulls back, shift more weight to side looking and pull hip back. Place one hand in middle of sink and let go with other hand so your shoulder can pull back. Switch hands to look other way.   Then hover one hand and look over shoulder. If looking right, use left hand at sink. If looking left, use right hand at sink. 6.  Stepping with leg that is not amputated:  Move items under cabinet out of your way. Shift your hips/pelvis so weight on prosthesis. Tighten muscles in hip on prosthetic side.  SLOWLY step other leg so front of foot is in cabinet. Then step back to floor.    ASSESSMENT:  CLINICAL IMPRESSION: Patient did well with weighted activity targeted at strengthening LE for improved endurance and gait mechanics. Patient required reeducation on proper donning of liner and prosthetic leg as she was very unsafe with prosthetic liner & prosthesis sliding off her limb. She appears to understand but will probably further review.  Patient will benefit from continued skilled physical therapy to address deficits and improve overall function of prosthesis.   OBJECTIVE IMPAIRMENTS: Abnormal gait, decreased activity tolerance, decreased balance, decreased endurance, decreased knowledge of condition, decreased knowledge of use of DME, decreased mobility, difficulty walking, decreased ROM, decreased strength, increased edema, impaired flexibility, postural dysfunction, prosthetic dependency , obesity, and pain.   ACTIVITY LIMITATIONS: carrying, lifting, bending, sitting, standing, stairs, transfers, and locomotion level  PARTICIPATION LIMITATIONS: meal prep, cleaning, and community activity  PERSONAL FACTORS: Fitness, Time since onset of injury/illness/exacerbation, and 3+  comorbidities: see PMH  are also affecting patient's functional outcome.   REHAB POTENTIAL: Good  CLINICAL DECISION MAKING: Evolving/moderate complexity  EVALUATION COMPLEXITY: Moderate   GOALS: Goals reviewed with patient? Yes  SHORT TERM GOALS: Target date: 05/20/2023  Patient reports no falls for 2 week time period.  Baseline: SEE OBJECTIVE DATA Goal status: Ongoing  05/03/2023 2.  Patient reports tolerating prosthesis >12 hrs total /day without skin issues or limb pain after standing. Baseline: SEE OBJECTIVE DATA Goal status: Ongoing  05/03/2023  3.  Berg Balance >/= 32/56 Baseline: SEE OBJECTIVE DATA Goal status: Ongoing  05/03/2023  4. Patient ambulates  150' with RW & prosthesis with supervision / verbal cues only for gait deviations. Baseline: SEE OBJECTIVE DATA Goal status: Ongoing  05/03/2023  5. Patient negotiates ramps & curbs with RW & prosthesis with supervision. Baseline: SEE OBJECTIVE DATA Goal status: Ongoing  05/03/2023   UPDATED LONG TERM GOALS: Target date: 07/20/2023  Patient demonstrates & verbalized understanding of prosthetic care to enable safe utilization of prosthesis. Baseline: SEE OBJECTIVE DATA Goal status: Ongoing  05/03/2023  Patient tolerates prosthesis wear >90% of awake hours without skin or residual limb pain issues. Baseline: SEE OBJECTIVE DATA Goal status: Ongoing  05/03/2023  Berg Balance >36/56 to indicate lower fall risk Baseline: SEE OBJECTIVE DATA Goal status: Ongoing  05/03/2023  Patient ambulates >300' with prosthesis and LRAD modified independent. Baseline: SEE OBJECTIVE DATA Goal status: Ongoing  05/03/2023  Patient negotiates ramps, curbs & stairs with single rail with prosthesis  and LRAD modified independent. Baseline: SEE OBJECTIVE DATA Goal status: Ongoing  05/03/2023   Patient reports right knee pain </= 2/10 with standing and gait activities.   Baseline: SEE OBJECTIVE DATA Goal status:   Ongoing   05/03/2023  PLAN:  PT FREQUENCY:  2x/wk  PT DURATION: 90 days / 13 weeks  PLANNED INTERVENTIONS: 97164- PT Re-evaluation, 97110-Therapeutic exercises, 97530- Therapeutic activity, 97112- Neuromuscular re-education, [phone]- Self Care, 34742- Gait training, [phone]- Prosthetic training, Patient/Family education, Balance training, Stair training, DME instructions, and physical performance testing  PLAN FOR NEXT SESSION:  check on donning, work towards LTGs, continue standing balance activities, and work on turning fluidity for reduction of falls     Cuong Moorman, Ronnell Clinger, Student-PT 05/05/2023, 4:03 PM  This entire session of physical therapy was performed under the direct supervision of PT signing evaluation /treatment. PT reviewed note and agrees.   Vladimir Faster, PT, DPT 05/06/2023, 7:16 AM   Date of referral: 12/22/2022 Referring provider: Aldean Baker, MD Referring diagnosis? O75.643 (ICD-10-CM) - Hx of AKA (above knee amputation), left  Treatment diagnosis? (if different than referring diagnosis)  Other abnormalities of gait and mobility  ICD-10-CM: R26.89    Unsteadiness on feet  ICD-10-CM: R26.81    Muscle weakness (generalized)  ICD-10-CM: M62.81    Chronic pain of right knee  M25.561 & G89.29   History of falling Z91.81  Abnormal Posture  R29.3   What was this (referring dx) caused by? Surgery (Type: AKA)   Nature of Condition: Initial Onset (within last 3 months) for prosthesis delivery              Laterality: Lt   Current Functional Measure Score: Other 04/21/2023 Sharlene Motts Balance 28/56  03/02/2023: Sharlene Motts Balance 22/56  at eval [facility] was 15/56   Objective measurements identify impairments when they are compared to normal values, the uninvolved extremity, and prior level of function.             [x]  Yes             []  No   Objective assessment of functional ability: Moderate functional limitations              Briefly describe symptoms: patient received prosthesis  01/25/2023 and was dependent in use, care and functional activities.  She has improved wear & care with PT education.  Her balance and gait continue to require skilled PT instruction for safety and function.    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     Average pain intensity:  Last 24 hours: 1-5/10 (higher due to recent fall with contusion)             Past week: 1-5/10   How often does the pt experience symptoms? Constantly   How much have the symptoms interfered with usual daily activities? Extremely   How has condition changed since care began at this facility? Better   In general, how is the patients overall health? Very Good     BACK PAIN (STarT Back Screening Tool) No

## 2023-05-05 NOTE — Patient Instructions (Signed)
 Sweating increases with an amputation. Your body is trying to regulate your temperature & without an extremity, you sweat greater amount & quicker to cool off. Also prosthetic material like liners do not breath and add hot layers which causes even more sweating. With time your body typically will accommodate to prosthesis and your sweat level will come closer to level with amputation but not pre-amputation level.   You need to pat your limb & liner dry when you notice sweating. If you leave sweat trapped inside your liner, then it can result in a blister.   Signs of sweating in your liner: You are sweating elsewhere on your body or you notice sweat running / dripping.  Take note of how high your liner comes up on your limb when you first put your liner on your limb. If you notice that your liner has slipped down, then you probably have sweat inside your liner. A good time to check for liner slippage is when toileting.  You feel air bubbles inside your liner. When you liner slips, then air is allowed in bottom. As you put weight on prosthesis, the air is burp or pushed out. You feel something crawling or moving inside your liner. When sweat runs inside the closed system of liner, it often feels like a bug or something crawling inside your liner.  If any of above symptoms are noted, you need to remove your prosthesis & liner to pat your limb & liner dry. This is permanent need as leaving sweat or water trapped can result in a blister or wound.      If wearing long pants, put pants on prosthesis first. Feed prosthesis thru left pant leg. You may have to take the shoe off.   1. Turn liner inside out. Make sure label is on top so strap is in front of leg. Fold liner along seams and place circle against bottom of your leg with fold level. Roll liner up your leg. You may need to scoot forward and turn to your right to clear your leg from w/c bottom.  2.  Feed strap thru slit in your prosthesis. Make sure  strap is not twisted. Tighten strap when sitting so greater than 2 inches of rough and soft velcro overlap.  3. Stand up to a walker or counter. Wiggle your leg down into the prosthesis. Pull strap out of hole first then disconnect velcro & tighten as much as possible. 4. Pick left leg up 5-10 times putting weight on & off prosthesis. Readjust, pull strap out of hole first then disconnect velcro & tighten as much as possible. 5. Step left leg out & back 5-10 times putting weight on & off prosthesis. Readjust, pull strap out of hole first then disconnect velcro & tighten as much as possible.    6. Check strap only when standing. Good to check when finish toileting when standing before you pull your pants back up.

## 2023-05-10 ENCOUNTER — Ambulatory Visit (INDEPENDENT_AMBULATORY_CARE_PROVIDER_SITE_OTHER): Admitting: Physical Therapy

## 2023-05-10 ENCOUNTER — Encounter: Payer: Self-pay | Admitting: Physical Therapy

## 2023-05-10 DIAGNOSIS — R293 Abnormal posture: Secondary | ICD-10-CM

## 2023-05-10 DIAGNOSIS — R2681 Unsteadiness on feet: Secondary | ICD-10-CM

## 2023-05-10 DIAGNOSIS — M6281 Muscle weakness (generalized): Secondary | ICD-10-CM

## 2023-05-10 DIAGNOSIS — R2689 Other abnormalities of gait and mobility: Secondary | ICD-10-CM

## 2023-05-10 DIAGNOSIS — Z9181 History of falling: Secondary | ICD-10-CM

## 2023-05-10 DIAGNOSIS — M25561 Pain in right knee: Secondary | ICD-10-CM

## 2023-05-10 DIAGNOSIS — G8929 Other chronic pain: Secondary | ICD-10-CM

## 2023-05-10 NOTE — Therapy (Signed)
 OUTPATIENT PHYSICAL THERAPY PROSTHETIC TREATMENT   Patient Name: Tammy [name] MRN: [id number] DOB:[birth year], 68 y.o., female Today's Date: 05/10/2023  END OF SESSION:  PT End of Session - 05/10/23 1515     Visit Number 20    Number of Visits 44    Date for PT Re-Evaluation 07/20/23    Authorization Type [employer] Medicare    Authorization Time Period [facility] MEDICARE APPROVED 4 PT VISITS 3/12-05/19/23    Authorization - Visit Number 3    Authorization - Number of Visits 4    Progress Note Due on Visit 27    PT Start Time 1516    PT Stop Time 1558    PT Time Calculation (min) 42 min    Equipment Utilized During Treatment Gait belt    Activity Tolerance Patient tolerated treatment well    Behavior During Therapy WFL for tasks assessed/performed                    Past Medical History:  Diagnosis Date   Anemia    hx of   Anxiety    on meds   Arthritis    back/bilateral ankles   Bipolar disorder (HCC)    Breast cancer (HCC) 2022   LEFT breast-radiation   Depression    on meds   Family history of breast cancer 07/10/2020   Family history of colon cancer 07/10/2020   Family history of ovarian cancer 07/10/2020   GERD (gastroesophageal reflux disease)    on meds   Heart murmur    dx by PCP   Hepatitis C    HTN (hypertension)    on meds   Hx of radiation therapy    Liver cirrhosis (HCC)    from hep c   Neuromuscular disorder (HCC)    Osteopenia    Personal history of radiation therapy    Stroke (HCC) 09/2012   slurred speech and LEFT sided weakness/notes 10/04/2012   Past Surgical History:  Procedure Laterality Date   AMPUTATION Left 11/04/2022   Procedure: LEFT ABOVE KNEE AMPUTATION;  Surgeon: Nadara Mustard, MD;  Location: [facility] OR;  Service: Orthopedics;  Laterality: Left;   ANKLE FRACTURE SURGERY Left 2018   APPLICATION OF WOUND VAC Left 06/15/2022   Procedure: APPLICATION OF WOUND VAC;  Surgeon: Peggye Form, DO;  Location: [facility];   Service: Plastics;  Laterality: Left;   APPLICATION OF WOUND VAC Left 09/14/2022   Procedure: APPLICATION OF WOUND VAC;  Surgeon: Peggye Form, DO;  Location: [facility] OR;  Service: Plastics;  Laterality: Left;   BREAST LUMPECTOMY Left 07/2020   BREAST LUMPECTOMY WITH RADIOACTIVE SEED AND SENTINEL LYMPH NODE BIOPSY Left 07/26/2020   Procedure: LEFT BREAST LUMPECTOMY WITH RADIOACTIVE SEED AND SENTINEL LYMPH NODE BIOPSY;  Surgeon: Abigail Miyamoto, MD;  Location: [facility];  Service: General;  Laterality: Left;   CESAREAN SECTION  1986   COLONOSCOPY     DIAGNOSTIC LAPAROSCOPY  1985   ESOPHAGOGASTRODUODENOSCOPY N/A 02/21/2023   Procedure: ESOPHAGOGASTRODUODENOSCOPY (EGD);  Surgeon: Hilarie Fredrickson, MD;  Location: [facility];  Service: Gastroenterology;  Laterality: N/A;   HARDWARE REMOVAL Left 04/30/2022   Procedure: HARDWARE REMOVAL LEFT FEMUR;  Surgeon: Samson Frederic, MD;  Location: [facility];  Service: Orthopedics;  Laterality: Left;  150   HARDWARE REMOVAL Left 11/04/2022   Procedure: HARDWARE REMOVAL INFECTED LEFT TOTAL KNEE ARTHROPLASTY;  Surgeon: Nadara Mustard, MD;  Location: [facility] OR;  Service: Orthopedics;  Laterality: Left;  I & D KNEE WITH POLY EXCHANGE Left 06/07/2022   Procedure: IRRIGATION AND DEBRIDEMENT KNEE WITH POLY EXCHANGE;  Surgeon: Samson Frederic, MD;  Location: [facility];  Service: Orthopedics;  Laterality: Left;   INCISION AND DRAINAGE OF WOUND Left 06/04/2022   Procedure: IRRIGATION AND DEBRIDEMENT KNEE;  Surgeon: Samson Frederic, MD;  Location: [facility];  Service: Orthopedics;  Laterality: Left;  60   INCISION AND DRAINAGE OF WOUND Left 06/15/2022   Procedure: excision of knee wound with Myriad;  Surgeon: Peggye Form, DO;  Location: [facility];  Service: Plastics;  Laterality: Left;   ORIF FEMUR FRACTURE Left 04/10/2021   Procedure: OPEN REDUCTION INTERNAL FIXATION (ORIF) DISTAL FEMUR FRACTURE;  Surgeon: Myrene Galas, MD;  Location: [facility];  Service:  Orthopedics;  Laterality: Left;   PARTIAL KNEE ARTHROPLASTY Left 04/30/2022   Procedure: DISTAL FEMUR REPLACEMENT;  Surgeon: Samson Frederic, MD;  Location: [facility];  Service: Orthopedics;  Laterality: Left;  150   Patient Active Problem List   Diagnosis Date Noted   Left femoral vein DVT (HCC) 02/20/2023   GERD (gastroesophageal reflux disease) 02/20/2023   Acute upper gastrointestinal bleeding 02/19/2023   Bipolar 1 disorder, depressed, partial remission (HCC) 11/18/2022   Left above-knee amputee (HCC) 11/13/2022   Infection of total knee replacement (HCC) 11/04/2022   S/P AKA (above knee amputation) unilateral, left (HCC) 11/04/2022   Chronic hepatitis C with cirrhosis (HCC) 06/06/2022   Surgical wound dehiscence, initial encounter 06/04/2022   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, left, with nonunion, subsequent encounter 04/30/2022   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 04/30/2022   Hypokalemia 04/16/2021   Hip fracture requiring operative repair (HCC) 04/16/2021   Vitamin D deficiency 04/12/2021   Hyponatremia 04/11/2021   Acute postoperative anemia due to expected blood loss 04/11/2021   Class 1 obesity 04/11/2021   Left comminuted and displaced fracture of the distal femur metaphysis 04/08/2021   History of CVA (cerebrovascular accident) 04/08/2021   Genetic testing 07/17/2020   Family history of breast cancer 07/10/2020   Family history of ovarian cancer 07/10/2020   Family history of colon cancer 07/10/2020   Malignant neoplasm of upper-outer quadrant of left breast in female, estrogen receptor positive (HCC) 07/09/2020   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   Chronic pain syndrome 10/06/2012   Nicotine dependence 10/06/2012   Anxiety state 10/05/2012   Bipolar disorder, unspecified (HCC) 10/05/2012   Slurred speech 10/04/2012   Left-sided weakness 10/04/2012   Hyperkalemia 10/04/2012   Acute  CVA (cerebrovascular accident) (HCC) 10/04/2012   Essential hypertension     PCP: Quitman Livings, MD  REFERRING PROVIDER: Aldean Baker, MD  ONSET DATE: 01/25/2023 prosthesis delivery  REFERRING DIAG: Q46.962 (ICD-10-CM) - Hx of AKA (above knee amputation), left   THERAPY DIAG:  Other abnormalities of gait and mobility  Unsteadiness on feet  Muscle weakness (generalized)  History of falling  Chronic pain of right knee  Abnormal posture  Rationale for Evaluation and Treatment: Rehabilitation  SUBJECTIVE:   SUBJECTIVE STATEMENT: She is putting the prosthesis on correctly and is more aware of sweating.  She walked with walker a lot.   Pt accompanied by: [name], significant other  PERTINENT HISTORY: AKA, anemia, anxiety, arthritis, Bipolar disorder, Breast CA left with radiation, HTN, Hep C, liver cirrhosis, Neuromuscular disorder, osteopenia, stroke Left hemiparesis 2014  PAIN:  Are you having pain? No pain Pain location: residual limb Pain description: soreness Aggravating factors: standing or walking too much,  or twisting motion Relieving factors: resting  PRECAUTIONS: Fall  WEIGHT BEARING RESTRICTIONS: No  FALLS: Has patient fallen in last 6 months? Yes. Number of falls 1 no injuries  LIVING ENVIRONMENT: Lives with: lives with their spouse Lives in: Mobile home Home Access: Ramped entrance or 4 steps with double rail to [employer] layout: One level Has following equipment at home: Single point cane, Environmental consultant - 2 wheeled, Wheelchair (manual), Shower bench, bed side commode, Grab bars, and Ramped entry  OCCUPATION: retired from nursing  PLOF: Independent prior to Fracture 04/10/2021.  Since fracture was able to get to cane for community until hardware broke.    PATIENT GOALS:  walk independently in community, travel, drive, take care of 4yo great granddaughter  OBJECTIVE:  COGNITION: Evaluation on 01/25/2023:  Overall cognitive status: Within functional limits for  tasks assessed  POSTURE:  04/21/2023:  standing with rounded shoulders, forward head, flexed trunk , and weight shift right  Evaluation on 01/25/2023: rounded shoulders, forward head, flexed trunk , and weight shift right  LOWER EXTREMITY ROM:  ROM P:passive  A:active Right eval Left eval  Hip flexion    Hip extension  12deg  Hip abduction    Hip adduction    Hip internal rotation    Hip external rotation    Knee flexion    Knee extension    Ankle dorsiflexion    Ankle plantarflexion    Ankle inversion    Ankle eversion     (Blank rows = not tested)  LOWER EXTREMITY MMT:  MMT Right eval Left eval  Hip flexion    Hip extension    Hip abduction    Hip adduction    Hip internal rotation    Hip external rotation    Knee flexion    Knee extension    Ankle dorsiflexion    Ankle plantarflexion    Ankle inversion    Ankle eversion    At Evaluation all strength testing is grossly seated and functionally standing / gait. (Blank rows = not tested)  TRANSFERS: 04/21/2023:  Sit to stand: SBA w/c to RW requires without armrests pushing off w/c seat and stabilization without UE support but RW close for safety.   Stand to sit: SBA RW to position feet, then able to sit down using armrests on w/c to control descent.  Evaluation on 01/25/2023:  Sit to stand: SBA w/c to RW requires armrests on w/c and stabilization with RW.   Stand to sit: SBA RW to w/c requires armrests on w/c and stabilization with RW.    FUNCTIONAL TESTs:  04/21/2023: TUG with RW: 58.28sec Berg balance: 28/56    03/02/2023: Sharlene Motts Balance 22/56  Eval: Berg Balance Scale: 15/56   GAIT: 05/10/2023:    Max tolerable distance: Patient ambulated 155' with rolling walker with close supervision.  04/20/2023: gait velocity with RW: 0.51 ft/sec Pt able to amb with RW & Transfemoral prosthesis with supervision / requiring verbal cues for gait deviations.    03/18/2023: -Patient ambulated 178ft and performing a  180deg turn CGA for patient safety with intermittent SBA  01/26/2023: Patient ambulated 10 feet with rolling walker with minA but required constant cueing for technique and safety.  PALPATION: 03/18/2023: patient verbalized seeing areas of discoloration on residual limb a few days prior. Therapist palpation and examination of area, did not see any noticeable areas with patient stating that the areas must have gone away without issue.    CURRENT PROSTHETIC WEAR ASSESSMENT: 04/21/2023:  Patient is  independent with: skin check, residual limb care, care of non-amputated limb, prosthetic cleaning, ply sock cleaning, correct ply sock adjustment, proper wear schedule/adjustment, and proper weight-bearing schedule/adjustment Donning prosthesis: modified independent Doffing prosthesis: modified independent Prosthetic wear tolerance: tolerated wear of prosthesis daily 6-7 hours but has decreased over last few days due to limb pain with recent fall.  Prosthetic weight bearing: pt tolerated standing 10 min for Berg Balance with partial weight on prosthesis.   Evaluation on 01/25/2023:  Patient is dependent with: skin check, residual limb care, care of non-amputated limb, prosthetic cleaning, ply sock cleaning, correct ply sock adjustment, proper wear schedule/adjustment, and proper weight-bearing schedule/adjustment Donning prosthesis: Min A Doffing prosthesis: SBA Prosthetic wear tolerance: tolerated wear 1 hour prior to PT evaluation.  Prosthetic weight bearing tolerance: 5 minutes with BUE support on RW with limited wear on prosthesis.  Edema: pitting Prosthetic description: Ischial Containment socket with flexible inner socket, silicon liner with velcro strap suspension.    TODAY'S TREATMENT:                                                                                                                             DATE:  05/10/2023: Prosthetic Training with Transfemoral Prosthesis: Patient  ambulated 155' with rolling walker with PT close supervision.  She had 1 mild loss of balance requiring min assist to stabilize.  PT instructed patient's fianc how to follow with the wheelchair and provide appropriate supervision.  He verbalized understanding. PT instructed patient and fianc in short (room to room) with goal to increase frequency throughout the day until she is not using the wheelchair in the home; long (her max tolerable distance) 1-2 times per day with her fianc following with the wheelchair; medium (a distance longer than short but not max or her long distance) goal is 4 or more per day.  This is designed to improve her activity tolerance and progress how far she can walk her activities. Patient able to go sit to and from stand using armrest on her transport wheelchair without touching the rolling walker for external support 5 reps. PT demo how to move a chair like sliding a chair under the dining room table including turning the chair.  Patient able to return demonstration scooting the chair forward 3 reps, backwards 3 reps and turning the chair 90 degrees.  Patient reports that she feels she can do this at home to begin eating at the dining room table without sitting in the wheelchair. Patient able to position prosthesis in stable stationary position with prosthetic foot back about 2 inches from sound foot.  She was able to manage her pants simulate what she would do in the bathroom.  PT recommended with fianc supervision that she began to stand to manage her close for toileting and washing her hands to exit the bathroom.  Patient and fianc verbalized understanding   TREATMENT:  DATE:  05/05/2023: Therex: Knee ext machine RLE 5lb 2x10 Knee flexion machine RLE 10lb 2x10  Prosthetic Training with Transfemoral Prosthesis: -Ambulated CGA with RW 61ft with minA  & verbal / tactile cues on weight shift over prosthesis and upright trunk; -After use of machine, patients leg was sliding off during ambulation causing circumduction and poor control in swing & stance. Her prosthetic liner was 4-6" off limb upon inspection.  She amb 10' to private room with RW with maxA for safety due to significant instability of prosthesis. -when pt donned liner, she did not fully invert the liner leaving air pocket in distal area.  PT demo & verbal cues on proper donning of liner as her current technique can lead to blisters at distal area and poor control of prosthesis.  She had a ~71mm blister at medial aspect of scar with no signs of infection.  Patient educated verbally & HO on knowing when residual limb is sweating with need to dry both limb & liner; proper drying of liner & prosthesis; skin care including lotion only at night cleaning off limb in morning prior to donning & antiperspirant on limb to decrease amount of sweating. Patient returned demo and verbalized understanding but will probably need. Patient provided with handout for sweat and proper donning. -pt amb 67' with RW to w/c with minA & verbal cues as above. PT pointed out difference in control of prosthesis with proper donning.    TREATMENT:                                                                                                                             DATE:  05/03/2023: Prosthetic Training with Transfemoral Prosthesis: -Ambulated 76ft with RW CGA to SBA with cues for proper sequencing, weight shift and step length -Stand>sit CGA requiring 2 attempts to rise, therapist intervention via vc to position LE correctly before attempt to rise. -Side step ups to right on 4" step at sink vc for placement of LLE; required vc tc and visual aid to properly place LLE slightly behind to reduce chance of knee buckling. Seated rest break required -360* turns at sink with chair back & sink support x2ea direction CW / CCW;  vc for sequencing and keeping weight over prosthesis when turning.  -patient lost balance requiring therapist intervention to prevent fall due to patient attempting to turn with cloth in hand on sink. Educated on decreasing instances where ambulating or turning with items in hand so she is able to properly catch herself and prevent falls. Pt verbalized understanding.  Therex: Leg press: 50lb 2x10; 1st bout with head elevated 2nd bout with back flat to increase hip extension range; VC and tc via tapping posteriorly on socket for LLE activation within socket to assist with activity. Rest break between sets.    HOME EXERCISE PROGRAM: Access Code: [email or web address] URL: [email or web address] Date: 04/12/2023 Prepared by: Vladimir Faster   Exercises - Seated Table Hamstring Stretch  - 1-3 x  daily - 7 x weekly - 1 sets - 2-3 reps - 20-30 seconds hold - Modified Thomas Stretch  - 2 x daily - 7 x weekly - 1 sets - 2-3 reps - 30 seconds hold - Hip Flexor Stretch at [employer] of Bed  - 2 x daily - 7 x weekly - 1 sets - 2-3 reps - 30 seconds hold - Standing posture with back to counter  - 2 x daily - 7 x weekly - 1 sets - 1 reps - 5-10 minutes hold - Upright Stance at Door Frame Single Arm  - 3-6 x daily - 7 x weekly - 1 sets - 2 reps - 2 deep breathes hold - Upright Stance at Door Frame with Both Arms  - 3-6 x daily - 7 x weekly - 1 sets - 2 reps - 2 deep breathes hold Startup  Patient Portal      Do each exercise 1-2  times per day Do each exercise 5-10 repetitions Hold each exercise for 2 seconds to feel your location  AT SINK FIND YOUR MIDLINE POSITION AND PLACE FEET EQUAL DISTANCE FROM THE MIDLINE.  Try to find this position when standing still for activities.   USE TAPE ON FLOOR TO MARK THE MIDLINE POSITION which is even with middle of sink.  You also should try to feel with your limb pressure in socket.  You are trying to feel with limb what you used to feel with the bottom of your  foot.  Side to Side Shift: Moving your hips only (not shoulders): move weight onto your left leg, HOLD/FEEL pressure in socket.  Move back to equal weight on each leg, HOLD/FEEL pressure in socket. Move weight onto your right leg, HOLD/FEEL pressure in socket. Move back to equal weight on each leg, HOLD/FEEL pressure in socket. Repeat.  Start with both hands on sink, progress to hand on prosthetic side only, then no hands.  Front to Back Shift: Moving your hips only (not shoulders): move your weight forward onto your toes, HOLD/FEEL pressure in socket. Move your weight back to equal Flat Foot on both legs, HOLD/FEEL  pressure in socket. Move your weight back onto your heels, HOLD/FEEL  pressure in socket. Move your weight back to equal on both legs, HOLD/FEEL  pressure in socket. Repeat.  Start with both hands on sink, progress to hand on prosthetic side only, then no hands.  Moving Cones / Cups: With equal weight on each leg: Hold on with one hand the first time, then progress to no hand supports. Move cups from one side of sink to the other. Place cups ~2" out of your reach, progress to 10" beyond reach.  Place one hand in middle of sink and reach with other hand. Do both arms.  Then hover one hand and move cups with other hand.  Overhead/Upward Reaching: alternated reaching up to top cabinets or ceiling if no cabinets present. Keep equal weight on each leg. Start with one hand support on counter while other hand reaches and progress to no hand support with reaching.  ace one hand in middle of sink and reach with other hand. Do both arms.  Then hover one hand and move cups with other hand.  5.   Looking Over Shoulders: With equal weight on each leg: alternate turning to look over your shoulders with one hand support on counter as needed.  Start with head motions only to look in front of shoulder, then even with shoulder and  progress to looking behind you. To look to side, move head /eyes, then shoulder on  side looking pulls back, shift more weight to side looking and pull hip back. Place one hand in middle of sink and let go with other hand so your shoulder can pull back. Switch hands to look other way.   Then hover one hand and look over shoulder. If looking right, use left hand at sink. If looking left, use right hand at sink. 6.  Stepping with leg that is not amputated:  Move items under cabinet out of your way. Shift your hips/pelvis so weight on prosthesis. Tighten muscles in hip on prosthetic side.  SLOWLY step other leg so front of foot is in cabinet. Then step back to floor.    ASSESSMENT:  CLINICAL IMPRESSION: Patient appears to understand recommendations for increase in her activity level.  Patient improved her ability to perform standing ADLs with supervision to work on functional balance.  Patient will benefit from continued skilled physical therapy to address deficits and improve overall function of prosthesis.   OBJECTIVE IMPAIRMENTS: Abnormal gait, decreased activity tolerance, decreased balance, decreased endurance, decreased knowledge of condition, decreased knowledge of use of DME, decreased mobility, difficulty walking, decreased ROM, decreased strength, increased edema, impaired flexibility, postural dysfunction, prosthetic dependency , obesity, and pain.   ACTIVITY LIMITATIONS: carrying, lifting, bending, sitting, standing, stairs, transfers, and locomotion level  PARTICIPATION LIMITATIONS: meal prep, cleaning, and community activity  PERSONAL FACTORS: Fitness, Time since onset of injury/illness/exacerbation, and 3+ comorbidities: see PMH  are also affecting patient's functional outcome.   REHAB POTENTIAL: Good  CLINICAL DECISION MAKING: Evolving/moderate complexity  EVALUATION COMPLEXITY: Moderate   GOALS: Goals reviewed with patient? Yes  SHORT TERM GOALS: Target date: 05/20/2023  Patient reports no falls for 2 week time period.  Baseline: SEE OBJECTIVE DATA Goal  status: Ongoing  05/10/2023 2.  Patient reports tolerating prosthesis >12 hrs total /day without skin issues or limb pain after standing. Baseline: SEE OBJECTIVE DATA Goal status: Ongoing  05/10/2023  3.  Berg Balance >/= 32/56 Baseline: SEE OBJECTIVE DATA Goal status: Ongoing  05/10/2023  4. Patient ambulates 150' with RW & prosthesis with supervision / verbal cues only for gait deviations. Baseline: SEE OBJECTIVE DATA Goal status: Ongoing  05/10/2023  5. Patient negotiates ramps & curbs with RW & prosthesis with supervision. Baseline: SEE OBJECTIVE DATA Goal status: Ongoing  05/10/2023   UPDATED LONG TERM GOALS: Target date: 07/20/2023  Patient demonstrates & verbalized understanding of prosthetic care to enable safe utilization of prosthesis. Baseline: SEE OBJECTIVE DATA Goal status: Ongoing  05/10/2023  Patient tolerates prosthesis wear >90% of awake hours without skin or residual limb pain issues. Baseline: SEE OBJECTIVE DATA Goal status: Ongoing  05/10/2023  Berg Balance >36/56 to indicate lower fall risk Baseline: SEE OBJECTIVE DATA Goal status: Ongoing  05/10/2023  Patient ambulates >300' with prosthesis and LRAD modified independent. Baseline: SEE OBJECTIVE DATA Goal status: Ongoing  05/10/2023  Patient negotiates ramps, curbs & stairs with single rail with prosthesis  and LRAD modified independent. Baseline: SEE OBJECTIVE DATA Goal status: Ongoing  05/10/2023   Patient reports right knee pain </= 2/10 with standing and gait activities.   Baseline: SEE OBJECTIVE DATA Goal status:   Ongoing  05/10/2023  PLAN:  PT FREQUENCY:  2x/wk  PT DURATION: 90 days / 13 weeks  PLANNED INTERVENTIONS: 97164- PT Re-evaluation, 97110-Therapeutic exercises, 97530- Therapeutic activity, 97112- Neuromuscular re-education, 97535- Self Care, 69629- Gait training, [id number]- Prosthetic training, Patient/Family education,  Balance training, Stair training, DME instructions, and physical performance  testing  PLAN FOR NEXT SESSION: Submit fo [employer] Medicare reauthorization, Berg balance test, continue standing balance activities, and work on turning fluidity for reduction of falls    Vladimir Faster, PT, DPT 05/10/2023, 4:23 PM   Date of referral: 12/22/2022 Referring provider: Aldean Baker, MD Referring diagnosis? Z61.096 (ICD-10-CM) - Hx of AKA (above knee amputation), left  Treatment diagnosis? (if different than referring diagnosis)  Other abnormalities of gait and mobility  ICD-10-CM: R26.89    Unsteadiness on feet  ICD-10-CM: R26.81    Muscle weakness (generalized)  ICD-10-CM: M62.81    Chronic pain of right knee  M25.561 & G89.29   History of falling Z91.81  Abnormal Posture  R29.3   What was this (referring dx) caused by? Surgery (Type: AKA)   Nature of Condition: Initial Onset (within last 3 months) for prosthesis delivery              Laterality: Lt   Current Functional Measure Score: Other 04/21/2023 Sharlene Motts Balance 28/56  03/02/2023: Sharlene Motts Balance 22/56  at eval Berg was 15/56   Objective measurements identify impairments when they are compared to normal values, the uninvolved extremity, and prior level of function.             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  She has improved wear & care with PT education.  Her balance and gait continue to require skilled PT instruction for safety and function.    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     Average pain intensity:             Last 24 hours: 1-5/10 (higher due to recent fall with contusion)             Past week: 1-5/10   How often does the pt experience symptoms? Constantly   How much have the symptoms interfered with usual daily activities? Extremely   How has condition  changed since care began at this facility? Better   In general, how is the patients overall health? Very Good     BACK PAIN (STarT Back Screening Tool) No

## 2023-05-12 ENCOUNTER — Ambulatory Visit (INDEPENDENT_AMBULATORY_CARE_PROVIDER_SITE_OTHER): Admitting: Physical Therapy

## 2023-05-12 ENCOUNTER — Encounter: Payer: Self-pay | Admitting: Physical Therapy

## 2023-05-12 DIAGNOSIS — M6281 Muscle weakness (generalized): Secondary | ICD-10-CM | POA: Diagnosis not present

## 2023-05-12 DIAGNOSIS — G8929 Other chronic pain: Secondary | ICD-10-CM

## 2023-05-12 DIAGNOSIS — M25561 Pain in right knee: Secondary | ICD-10-CM

## 2023-05-12 DIAGNOSIS — Z9181 History of falling: Secondary | ICD-10-CM | POA: Diagnosis not present

## 2023-05-12 DIAGNOSIS — R2681 Unsteadiness on feet: Secondary | ICD-10-CM

## 2023-05-12 DIAGNOSIS — R2689 Other abnormalities of gait and mobility: Secondary | ICD-10-CM

## 2023-05-12 NOTE — Therapy (Signed)
 OUTPATIENT PHYSICAL THERAPY PROSTHETIC TREATMENT   Patient Name: Tammy [name] MRN: [id number] DOB:[birth year], 68 y.o., female Today's Date: 05/12/2023  END OF SESSION:  PT End of Session - 05/12/23 1515     Visit Number 21    Number of Visits 44    Date for PT Re-Evaluation 07/20/23    Authorization Type [employer] Medicare    Authorization Time Period [facility] MEDICARE APPROVED 4 PT VISITS 3/12-05/19/23    Authorization - Visit Number 4    Authorization - Number of Visits 4    Progress Note Due on Visit 27    PT Start Time 1515    PT Stop Time 1603    PT Time Calculation (min) 48 min    Equipment Utilized During Treatment Gait belt    Activity Tolerance Patient tolerated treatment well    Behavior During Therapy WFL for tasks assessed/performed                    Past Medical History:  Diagnosis Date   Anemia    hx of   Anxiety    on meds   Arthritis    back/bilateral ankles   Bipolar disorder (HCC)    Breast cancer (HCC) 2022   LEFT breast-radiation   Depression    on meds   Family history of breast cancer 07/10/2020   Family history of colon cancer 07/10/2020   Family history of ovarian cancer 07/10/2020   GERD (gastroesophageal reflux disease)    on meds   Heart murmur    dx by PCP   Hepatitis C    HTN (hypertension)    on meds   Hx of radiation therapy    Liver cirrhosis (HCC)    from hep c   Neuromuscular disorder (HCC)    Osteopenia    Personal history of radiation therapy    Stroke (HCC) 09/2012   slurred speech and LEFT sided weakness/notes 10/04/2012   Past Surgical History:  Procedure Laterality Date   AMPUTATION Left 11/04/2022   Procedure: LEFT ABOVE KNEE AMPUTATION;  Surgeon: Nadara Mustard, MD;  Location: [facility] OR;  Service: Orthopedics;  Laterality: Left;   ANKLE FRACTURE SURGERY Left 2018   APPLICATION OF WOUND VAC Left 06/15/2022   Procedure: APPLICATION OF WOUND VAC;  Surgeon: Peggye Form, DO;  Location: [facility];   Service: Plastics;  Laterality: Left;   APPLICATION OF WOUND VAC Left 09/14/2022   Procedure: APPLICATION OF WOUND VAC;  Surgeon: Peggye Form, DO;  Location: [facility] OR;  Service: Plastics;  Laterality: Left;   BREAST LUMPECTOMY Left 07/2020   BREAST LUMPECTOMY WITH RADIOACTIVE SEED AND SENTINEL LYMPH NODE BIOPSY Left 07/26/2020   Procedure: LEFT BREAST LUMPECTOMY WITH RADIOACTIVE SEED AND SENTINEL LYMPH NODE BIOPSY;  Surgeon: Abigail Miyamoto, MD;  Location: [facility];  Service: General;  Laterality: Left;   CESAREAN SECTION  1986   COLONOSCOPY     DIAGNOSTIC LAPAROSCOPY  1985   ESOPHAGOGASTRODUODENOSCOPY N/A 02/21/2023   Procedure: ESOPHAGOGASTRODUODENOSCOPY (EGD);  Surgeon: Hilarie Fredrickson, MD;  Location: [facility];  Service: Gastroenterology;  Laterality: N/A;   HARDWARE REMOVAL Left 04/30/2022   Procedure: HARDWARE REMOVAL LEFT FEMUR;  Surgeon: Samson Frederic, MD;  Location: [facility];  Service: Orthopedics;  Laterality: Left;  150   HARDWARE REMOVAL Left 11/04/2022   Procedure: HARDWARE REMOVAL INFECTED LEFT TOTAL KNEE ARTHROPLASTY;  Surgeon: Nadara Mustard, MD;  Location: [facility] OR;  Service: Orthopedics;  Laterality: Left;  I & D KNEE WITH POLY EXCHANGE Left 06/07/2022   Procedure: IRRIGATION AND DEBRIDEMENT KNEE WITH POLY EXCHANGE;  Surgeon: Samson Frederic, MD;  Location: [facility];  Service: Orthopedics;  Laterality: Left;   INCISION AND DRAINAGE OF WOUND Left 06/04/2022   Procedure: IRRIGATION AND DEBRIDEMENT KNEE;  Surgeon: Samson Frederic, MD;  Location: [facility];  Service: Orthopedics;  Laterality: Left;  60   INCISION AND DRAINAGE OF WOUND Left 06/15/2022   Procedure: excision of knee wound with Myriad;  Surgeon: Peggye Form, DO;  Location: [facility];  Service: Plastics;  Laterality: Left;   ORIF FEMUR FRACTURE Left 04/10/2021   Procedure: OPEN REDUCTION INTERNAL FIXATION (ORIF) DISTAL FEMUR FRACTURE;  Surgeon: Myrene Galas, MD;  Location: [facility] OR;  Service:  Orthopedics;  Laterality: Left;   PARTIAL KNEE ARTHROPLASTY Left 04/30/2022   Procedure: DISTAL FEMUR REPLACEMENT;  Surgeon: Samson Frederic, MD;  Location: [facility];  Service: Orthopedics;  Laterality: Left;  150   Patient Active Problem List   Diagnosis Date Noted   Left femoral vein DVT (HCC) 02/20/2023   GERD (gastroesophageal reflux disease) 02/20/2023   Acute upper gastrointestinal bleeding 02/19/2023   Bipolar 1 disorder, depressed, partial remission (HCC) 11/18/2022   Left above-knee amputee (HCC) 11/13/2022   Infection of total knee replacement (HCC) 11/04/2022   S/P AKA (above knee amputation) unilateral, left (HCC) 11/04/2022   Chronic hepatitis C with cirrhosis (HCC) 06/06/2022   Surgical wound dehiscence, initial encounter 06/04/2022   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, left, with nonunion, subsequent encounter 04/30/2022   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 04/30/2022   Hypokalemia 04/16/2021   Hip fracture requiring operative repair (HCC) 04/16/2021   Vitamin D deficiency 04/12/2021   Hyponatremia 04/11/2021   Acute postoperative anemia due to expected blood loss 04/11/2021   Class 1 obesity 04/11/2021   Left comminuted and displaced fracture of the distal femur metaphysis 04/08/2021   History of CVA (cerebrovascular accident) 04/08/2021   Genetic testing 07/17/2020   Family history of breast cancer 07/10/2020   Family history of ovarian cancer 07/10/2020   Family history of colon cancer 07/10/2020   Malignant neoplasm of upper-outer quadrant of left breast in female, estrogen receptor positive (HCC) 07/09/2020   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   Chronic pain syndrome 10/06/2012   Nicotine dependence 10/06/2012   Anxiety state 10/05/2012   Bipolar disorder, unspecified (HCC) 10/05/2012   Slurred speech 10/04/2012   Left-sided weakness 10/04/2012   Hyperkalemia 10/04/2012   Acute  CVA (cerebrovascular accident) (HCC) 10/04/2012   Essential hypertension     PCP: Quitman Livings, MD  REFERRING PROVIDER: Aldean Baker, MD  ONSET DATE: 01/25/2023 prosthesis delivery  REFERRING DIAG: Z61.096 (ICD-10-CM) - Hx of AKA (above knee amputation), left   THERAPY DIAG:  Other abnormalities of gait and mobility  Unsteadiness on feet  History of falling  Muscle weakness (generalized)  Chronic pain of right knee  Rationale for Evaluation and Treatment: Rehabilitation  SUBJECTIVE:   SUBJECTIVE STATEMENT: She has improved getting prosthetic liner on limb but can not get strap as tight.  She donned prosthesis within 30 min of arising & wore it for 5 hours.  She took it off to change clothes then redonned.    Pt accompanied by: [name], significant other  PERTINENT HISTORY: AKA, anemia, anxiety, arthritis, Bipolar disorder, Breast CA left with radiation, HTN, Hep C, liver cirrhosis, Neuromuscular disorder, osteopenia, stroke Left hemiparesis 2014  PAIN:  Are you  having pain? No pain Pain location: residual limb Pain description: soreness Aggravating factors: standing or walking too much, or twisting motion Relieving factors: resting  PRECAUTIONS: Fall  WEIGHT BEARING RESTRICTIONS: No  FALLS: Has patient fallen in last 6 months? Yes. Number of falls 1 no injuries  LIVING ENVIRONMENT: Lives with: lives with their spouse Lives in: Mobile home Home Access: Ramped entrance or 4 steps with double rail to [employer] layout: One level Has following equipment at home: Single point cane, Environmental consultant - 2 wheeled, Wheelchair (manual), Shower bench, bed side commode, Grab bars, and Ramped entry  OCCUPATION: retired from nursing  PLOF: Independent prior to Fracture 04/10/2021.  Since fracture was able to get to cane for community until hardware broke.    PATIENT GOALS:  walk independently in community, travel, drive, take care of 4yo great granddaughter  OBJECTIVE:   COGNITION: Evaluation on 01/25/2023:  Overall cognitive status: Within functional limits for tasks assessed  POSTURE:  04/21/2023:  standing with rounded shoulders, forward head, flexed trunk , and weight shift right  Evaluation on 01/25/2023: rounded shoulders, forward head, flexed trunk , and weight shift right  LOWER EXTREMITY ROM:  ROM P:passive  A:active Right eval Left eval  Hip flexion    Hip extension  12deg  Hip abduction    Hip adduction    Hip internal rotation    Hip external rotation    Knee flexion    Knee extension    Ankle dorsiflexion    Ankle plantarflexion    Ankle inversion    Ankle eversion     (Blank rows = not tested)  LOWER EXTREMITY MMT:  MMT Right eval Left eval  Hip flexion    Hip extension    Hip abduction    Hip adduction    Hip internal rotation    Hip external rotation    Knee flexion    Knee extension    Ankle dorsiflexion    Ankle plantarflexion    Ankle inversion    Ankle eversion    At Evaluation all strength testing is grossly seated and functionally standing / gait. (Blank rows = not tested)  TRANSFERS: 04/21/2023:  Sit to stand: SBA w/c to RW requires without armrests pushing off w/c seat and stabilization without UE support but RW close for safety.   Stand to sit: SBA RW to position feet, then able to sit down using armrests on w/c to control descent.  Evaluation on 01/25/2023:  Sit to stand: SBA w/c to RW requires armrests on w/c and stabilization with RW.   Stand to sit: SBA RW to w/c requires armrests on w/c and stabilization with RW.    FUNCTIONAL TESTs:  05/12/2023: Sharlene Motts balance 28/56  04/21/2023: TUG with RW: 58.28sec Berg balance: 28/56   03/02/2023: Sharlene Motts Balance 22/56  Eval: Berg Balance Scale: 15/56   GAIT: 05/10/2023:    Max tolerable distance: Patient ambulated 155' with rolling walker with close supervision.  04/20/2023: gait velocity with RW: 0.51 ft/sec Pt able to amb with RW & Transfemoral  prosthesis with supervision / requiring verbal cues for gait deviations.    03/18/2023: -Patient ambulated 157ft and performing a 180deg turn CGA for patient safety with intermittent SBA  01/26/2023: Patient ambulated 10 feet with rolling walker with minA but required constant cueing for technique and safety.  PALPATION: 03/18/2023: patient verbalized seeing areas of discoloration on residual limb a few days prior. Therapist palpation and examination of area, did not see any noticeable areas with  patient stating that the areas must have gone away without issue.    CURRENT PROSTHETIC WEAR ASSESSMENT: 04/21/2023:  Patient is independent with: skin check, residual limb care, care of non-amputated limb, prosthetic cleaning, ply sock cleaning, correct ply sock adjustment, proper wear schedule/adjustment, and proper weight-bearing schedule/adjustment Donning prosthesis: modified independent Doffing prosthesis: modified independent Prosthetic wear tolerance: tolerated wear of prosthesis daily 6-7 hours but has decreased over last few days due to limb pain with recent fall.  Prosthetic weight bearing: pt tolerated standing 10 min for Berg Balance with partial weight on prosthesis.   Evaluation on 01/25/2023:  Patient is dependent with: skin check, residual limb care, care of non-amputated limb, prosthetic cleaning, ply sock cleaning, correct ply sock adjustment, proper wear schedule/adjustment, and proper weight-bearing schedule/adjustment Donning prosthesis: Min A Doffing prosthesis: SBA Prosthetic wear tolerance: tolerated wear 1 hour prior to PT evaluation.  Prosthetic weight bearing tolerance: 5 minutes with BUE support on RW with limited wear on prosthesis.  Edema: pitting Prosthetic description: Ischial Containment socket with flexible inner socket, silicon liner with velcro strap suspension.    TODAY'S TREATMENT:                                                                                                                              DATE:  05/12/2023: Prosthetic Training with Transfemoral Prosthesis: PT demo and verbal cues on use of rollator walker including rationale the rivet improved her ability to rest for community activities and transport items from one location to another.  Patient instructed in right management for safety, technique for walking, sit to / from stand from a chair and from rollator seat, negotiating ramp and negotiating a curb. Patient ambulated 50 feet and 10' x 2 with rollator walker with contact-guard assist.  Patient improved fluency as she progressed through the session. Patient able to turn to sit on and off the rollator seat with contact-guard assist and cues. Patient able to perform sit to/from stand from 18" chair without armrest using BUEs on chair seat to lock rollator walker with supervision. Patient negotiated 6.5" curb with rollator walker with minA and verbal cues. Patient negotiated 12*incline with rollator walker with min a and verbal cues  Physical performance test See Berg balance test in objective data  TREATMENT:  DATE:  05/10/2023: Prosthetic Training with Transfemoral Prosthesis: PT reviewed donning again.  Her liner was only about 1/2 inch lower than it should be.  She requires verbal and minA for fully tightening the suspension strap.  PT reviewed adjusting ply socks as patient is using more socks than necessary thinking that his padding.  PT instructed that is more based on making sure her limb is seated the correct depth down into the socket based on limb size.  Patient verbalized a general understanding but will require further education to fully get this concept. Patient ambulated 155' with rolling walker with PT close supervision.  She had 1 mild loss of balance requiring min assist to stabilize.  PT instructed  patient's fianc how to follow with the wheelchair and provide appropriate supervision.  He verbalized understanding. PT instructed patient and fianc in short (room to room) with goal to increase frequency throughout the day until she is not using the wheelchair in the home; long (her max tolerable distance) 1-2 times per day with her fianc following with the wheelchair; medium (a distance longer than short but not max or her long distance) goal is 4 or more per day.  This is designed to improve her activity tolerance and progress how far she can walk her activities. Patient able to go sit to and from stand using armrest on her transport wheelchair without touching the rolling walker for external support 5 reps. PT demo how to move a chair like sliding a chair under the dining room table including turning the chair.  Patient able to return demonstration scooting the chair forward 3 reps, backwards 3 reps and turning the chair 90 degrees.  Patient reports that she feels she can do this at home to begin eating at the dining room table without sitting in the wheelchair. Patient able to position prosthesis in stable stationary position with prosthetic foot back about 2 inches from sound foot.  She was able to manage her pants simulate what she would do in the bathroom.  PT recommended with fianc supervision that she began to stand to manage her close for toileting and washing her hands to exit the bathroom.  Patient and fianc verbalized understanding   TREATMENT:                                                                                                                             DATE:  05/05/2023: Therex: Knee ext machine RLE 5lb 2x10 Knee flexion machine RLE 10lb 2x10  Prosthetic Training with Transfemoral Prosthesis: -Ambulated CGA with RW 27ft with minA & verbal / tactile cues on weight shift over prosthesis and upright trunk; -After use of machine, patients leg was sliding off during  ambulation causing circumduction and poor control in swing & stance. Her prosthetic liner was 4-6" off limb upon inspection.  She amb 10' to private room with RW with maxA for safety due to significant instability of prosthesis. -when pt donned liner,  she did not fully invert the liner leaving air pocket in distal area.  PT demo & verbal cues on proper donning of liner as her current technique can lead to blisters at distal area and poor control of prosthesis.  She had a ~67mm blister at medial aspect of scar with no signs of infection.  Patient educated verbally & HO on knowing when residual limb is sweating with need to dry both limb & liner; proper drying of liner & prosthesis; skin care including lotion only at night cleaning off limb in morning prior to donning & antiperspirant on limb to decrease amount of sweating. Patient returned demo and verbalized understanding but will probably need. Patient provided with handout for sweat and proper donning. -pt amb 66' with RW to w/c with minA & verbal cues as above. PT pointed out difference in control of prosthesis with proper donning.    TREATMENT:                                                                                                                             DATE:  05/03/2023: Prosthetic Training with Transfemoral Prosthesis: -Ambulated 62ft with RW CGA to SBA with cues for proper sequencing, weight shift and step length -Stand>sit CGA requiring 2 attempts to rise, therapist intervention via vc to position LE correctly before attempt to rise. -Side step ups to right on 4" step at sink vc for placement of LLE; required vc tc and visual aid to properly place LLE slightly behind to reduce chance of knee buckling. Seated rest break required -360* turns at sink with chair back & sink support x2ea direction CW / CCW; vc for sequencing and keeping weight over prosthesis when turning.  -patient lost balance requiring therapist intervention to prevent  fall due to patient attempting to turn with cloth in hand on sink. Educated on decreasing instances where ambulating or turning with items in hand so she is able to properly catch herself and prevent falls. Pt verbalized understanding.  Therex: Leg press: 50lb 2x10; 1st bout with head elevated 2nd bout with back flat to increase hip extension range; VC and tc via tapping posteriorly on socket for LLE activation within socket to assist with activity. Rest break between sets.    HOME EXERCISE PROGRAM: Access Code: [email or web address] URL: [email or web address] Date: 04/12/2023 Prepared by: Vladimir Faster   Exercises - Seated Table Hamstring Stretch  - 1-3 x daily - 7 x weekly - 1 sets - 2-3 reps - 20-30 seconds hold - Modified Thomas Stretch  - 2 x daily - 7 x weekly - 1 sets - 2-3 reps - 30 seconds hold - Hip Flexor Stretch at [place] of Bed  - 2 x daily - 7 x weekly - 1 sets - 2-3 reps - 30 seconds hold - Standing posture with back to counter  - 2 x daily - 7 x weekly - 1 sets - 1 reps - 5-10 minutes hold - Upright Stance at  Door Frame Single Arm  - 3-6 x daily - 7 x weekly - 1 sets - 2 reps - 2 deep breathes hold - Upright Stance at Door Frame with Both Arms  - 3-6 x daily - 7 x weekly - 1 sets - 2 reps - 2 deep breathes hold [place]  Patient Portal      Do each exercise 1-2  times per day Do each exercise 5-10 repetitions Hold each exercise for 2 seconds to feel your location  AT SINK FIND YOUR MIDLINE POSITION AND PLACE FEET EQUAL DISTANCE FROM THE MIDLINE.  Try to find this position when standing still for activities.   USE TAPE ON FLOOR TO MARK THE MIDLINE POSITION which is even with middle of sink.  You also should try to feel with your limb pressure in socket.  You are trying to feel with limb what you used to feel with the bottom of your foot.  Side to Side Shift: Moving your hips only (not shoulders): move weight onto your left leg, HOLD/FEEL pressure in socket.  Move  back to equal weight on each leg, HOLD/FEEL pressure in socket. Move weight onto your right leg, HOLD/FEEL pressure in socket. Move back to equal weight on each leg, HOLD/FEEL pressure in socket. Repeat.  Start with both hands on sink, progress to hand on prosthetic side only, then no hands.  Front to Back Shift: Moving your hips only (not shoulders): move your weight forward onto your toes, HOLD/FEEL pressure in socket. Move your weight back to equal Flat Foot on both legs, HOLD/FEEL  pressure in socket. Move your weight back onto your heels, HOLD/FEEL  pressure in socket. Move your weight back to equal on both legs, HOLD/FEEL  pressure in socket. Repeat.  Start with both hands on sink, progress to hand on prosthetic side only, then no hands.  Moving Cones / Cups: With equal weight on each leg: Hold on with one hand the first time, then progress to no hand supports. Move cups from one side of sink to the other. Place cups ~2" out of your reach, progress to 10" beyond reach.  Place one hand in middle of sink and reach with other hand. Do both arms.  Then hover one hand and move cups with other hand.  Overhead/Upward Reaching: alternated reaching up to top cabinets or ceiling if no cabinets present. Keep equal weight on each leg. Start with one hand support on counter while other hand reaches and progress to no hand support with reaching.  ace one hand in middle of sink and reach with other hand. Do both arms.  Then hover one hand and move cups with other hand.  5.   Looking Over Shoulders: With equal weight on each leg: alternate turning to look over your shoulders with one hand support on counter as needed.  Start with head motions only to look in front of shoulder, then even with shoulder and progress to looking behind you. To look to side, move head /eyes, then shoulder on side looking pulls back, shift more weight to side looking and pull hip back. Place one hand in middle of sink and let go with other hand  so your shoulder can pull back. Switch hands to look other way.   Then hover one hand and look over shoulder. If looking right, use left hand at sink. If looking left, use right hand at sink. 6.  Stepping with leg that is not amputated:  Move items under cabinet  out of your way. Shift your hips/pelvis so weight on prosthesis. Tighten muscles in hip on prosthetic side.  SLOWLY step other leg so front of foot is in cabinet. Then step back to floor.    ASSESSMENT:  CLINICAL IMPRESSION: Patient requires lots of repetition for education for carryover.  Patient continues to be high risk of falls but is slowly improving with repetition of education.  PT introduced rollator walker today as patient would be more mobile in the community given the ability to rest as needed and it also would allow her to transport items from one area to another in a safer manner than she has been attempting with the standard rolling walker walker.  Patient will benefit from continued skilled physical therapy to address deficits and improve overall function of prosthesis.   OBJECTIVE IMPAIRMENTS: Abnormal gait, decreased activity tolerance, decreased balance, decreased endurance, decreased knowledge of condition, decreased knowledge of use of DME, decreased mobility, difficulty walking, decreased ROM, decreased strength, increased edema, impaired flexibility, postural dysfunction, prosthetic dependency , obesity, and pain.   ACTIVITY LIMITATIONS: carrying, lifting, bending, sitting, standing, stairs, transfers, and locomotion level  PARTICIPATION LIMITATIONS: meal prep, cleaning, and community activity  PERSONAL FACTORS: Fitness, Time since onset of injury/illness/exacerbation, and 3+ comorbidities: see PMH  are also affecting patient's functional outcome.   REHAB POTENTIAL: Good  CLINICAL DECISION MAKING: Evolving/moderate complexity  EVALUATION COMPLEXITY: Moderate   GOALS: Goals reviewed with patient? Yes  SHORT  TERM GOALS: Target date: 05/20/2023  Patient reports no falls for 2 week time period.  Baseline: SEE OBJECTIVE DATA Goal status: Ongoing  05/10/2023 2.  Patient reports tolerating prosthesis >12 hrs total /day without skin issues or limb pain after standing. Baseline: SEE OBJECTIVE DATA Goal status: Ongoing  05/10/2023  3.  Berg Balance >/= 32/56 Baseline: SEE OBJECTIVE DATA Goal status: Ongoing  05/10/2023  4. Patient ambulates 150' with RW & prosthesis with supervision / verbal cues only for gait deviations. Baseline: SEE OBJECTIVE DATA Goal status: Ongoing  05/10/2023  5. Patient negotiates ramps & curbs with RW & prosthesis with supervision. Baseline: SEE OBJECTIVE DATA Goal status: Ongoing  05/10/2023   UPDATED LONG TERM GOALS: Target date: 07/20/2023  Patient demonstrates & verbalized understanding of prosthetic care to enable safe utilization of prosthesis. Baseline: SEE OBJECTIVE DATA Goal status: Ongoing  05/10/2023  Patient tolerates prosthesis wear >90% of awake hours without skin or residual limb pain issues. Baseline: SEE OBJECTIVE DATA Goal status: Ongoing  05/10/2023  Berg Balance >36/56 to indicate lower fall risk Baseline: SEE OBJECTIVE DATA Goal status: Ongoing  05/10/2023  Patient ambulates >300' with prosthesis and LRAD modified independent. Baseline: SEE OBJECTIVE DATA Goal status: Ongoing  05/10/2023  Patient negotiates ramps, curbs & stairs with single rail with prosthesis  and LRAD modified independent. Baseline: SEE OBJECTIVE DATA Goal status: Ongoing  05/10/2023   Patient reports right knee pain </= 2/10 with standing and gait activities.   Baseline: SEE OBJECTIVE DATA Goal status:   Ongoing  05/10/2023  PLAN:  PT FREQUENCY:  2x/wk  PT DURATION: 90 days / 13 weeks  PLANNED INTERVENTIONS: 97164- PT Re-evaluation, 97110-Therapeutic exercises, 97530- Therapeutic activity, 97112- Neuromuscular re-education, [phone]- Self Care, 60454- Gait training, [phone]-  Prosthetic training, Patient/Family education, Balance training, Stair training, DME instructions, and physical performance testing  PLAN FOR NEXT SESSION: Continue work with rollator walker to improve safety and community mobility.  Balance activities.  High repetition of education for improved retention.   Vladimir Faster, PT, DPT 05/12/2023,  4:18 PM   Date of referral: 12/22/2022 Referring provider: Aldean Baker, MD Referring diagnosis? W09.811 (ICD-10-CM) - Hx of AKA (above knee amputation), left  Treatment diagnosis? (if different than referring diagnosis)  Other abnormalities of gait and mobility  ICD-10-CM: R26.89    Unsteadiness on feet  ICD-10-CM: R26.81    Muscle weakness (generalized)  ICD-10-CM: M62.81    Chronic pain of right knee  M25.561 & G89.29   History of falling Z91.81  Abnormal Posture  R29.3   What was this (referring dx) caused by? Surgery (Type: AKA)   Nature of Condition: Initial Onset (within last 3 months) for prosthesis delivery              Laterality: Lt   Current Functional Measure Score: Other 05/12/2023 & 04/21/2023 Sharlene Motts Balance 28/56  1/21/2025Sharlene Motts Balance 22/56  at eval Berg was 15/56;     Objective measurements identify impairments when they are compared to normal values, the uninvolved extremity, and prior level of function.             [x]  Yes             []  No   Objective assessment of functional ability: Moderate functional limitations              Briefly describe symptoms: patient requires lots of repetition for education and safety with use of her prosthesis.  Patient has had repetitive falls but these have decreased with PT educating patient.  PT began educating patient on use of rollator walker as this transition should improve her ability for community mobility allowing rest and transporting items.  Patient requires more education to safely utilize a rollator walker.   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     Average pain intensity:             Last 24 hours: 1-5/10 (higher due to recent fall with contusion)             Past week: 1-5/10   How often does the pt experience symptoms? Constantly   How much have the symptoms interfered with usual daily activities? Extremely   How has condition changed since care began at this facility? Better   In general, how is the patients overall health? Very Good     BACK PAIN (STarT Back Screening Tool) No

## 2023-05-17 ENCOUNTER — Encounter: Payer: Self-pay | Admitting: Physical Therapy

## 2023-05-17 ENCOUNTER — Ambulatory Visit (INDEPENDENT_AMBULATORY_CARE_PROVIDER_SITE_OTHER): Admitting: Physical Therapy

## 2023-05-17 DIAGNOSIS — Z9181 History of falling: Secondary | ICD-10-CM | POA: Diagnosis not present

## 2023-05-17 DIAGNOSIS — M6281 Muscle weakness (generalized): Secondary | ICD-10-CM | POA: Diagnosis not present

## 2023-05-17 DIAGNOSIS — M25561 Pain in right knee: Secondary | ICD-10-CM

## 2023-05-17 DIAGNOSIS — R2681 Unsteadiness on feet: Secondary | ICD-10-CM | POA: Diagnosis not present

## 2023-05-17 DIAGNOSIS — R2689 Other abnormalities of gait and mobility: Secondary | ICD-10-CM

## 2023-05-17 DIAGNOSIS — R293 Abnormal posture: Secondary | ICD-10-CM

## 2023-05-17 DIAGNOSIS — G8929 Other chronic pain: Secondary | ICD-10-CM

## 2023-05-17 NOTE — Therapy (Signed)
 OUTPATIENT PHYSICAL THERAPY PROSTHETIC TREATMENT   Patient Name: Tammy [name] MRN: [id number] DOB:[birth year], 68 y.o., female Today's Date: 05/17/2023  END OF SESSION:  PT End of Session - 05/17/23 1508     Visit Number 22    Number of Visits 44    Date for PT Re-Evaluation 07/20/23    Authorization Type [employer] Medicare    Authorization Time Period [facility] MEDICARE APPROVED 4 PT VISITS 05/12/23-06/09/2023    Authorization - Visit Number 1    Authorization - Number of Visits 4    Progress Note Due on Visit 27    PT Start Time 1510    PT Stop Time 1603    PT Time Calculation (min) 53 min    Equipment Utilized During Treatment Gait belt    Activity Tolerance Patient tolerated treatment well    Behavior During Therapy WFL for tasks assessed/performed                     Past Medical History:  Diagnosis Date   Anemia    hx of   Anxiety    on meds   Arthritis    back/bilateral ankles   Bipolar disorder (HCC)    Breast cancer (HCC) 2022   LEFT breast-radiation   Depression    on meds   Family history of breast cancer 07/10/2020   Family history of colon cancer 07/10/2020   Family history of ovarian cancer 07/10/2020   GERD (gastroesophageal reflux disease)    on meds   Heart murmur    dx by PCP   Hepatitis C    HTN (hypertension)    on meds   Hx of radiation therapy    Liver cirrhosis (HCC)    from hep c   Neuromuscular disorder (HCC)    Osteopenia    Personal history of radiation therapy    Stroke (HCC) 09/2012   slurred speech and LEFT sided weakness/notes 10/04/2012   Past Surgical History:  Procedure Laterality Date   AMPUTATION Left 11/04/2022   Procedure: LEFT ABOVE KNEE AMPUTATION;  Surgeon: Nadara Mustard, MD;  Location: [facility] OR;  Service: Orthopedics;  Laterality: Left;   ANKLE FRACTURE SURGERY Left 2018   APPLICATION OF WOUND VAC Left 06/15/2022   Procedure: APPLICATION OF WOUND VAC;  Surgeon: Peggye Form, DO;  Location: [facility];   Service: Plastics;  Laterality: Left;   APPLICATION OF WOUND VAC Left 09/14/2022   Procedure: APPLICATION OF WOUND VAC;  Surgeon: Peggye Form, DO;  Location: [facility] OR;  Service: Plastics;  Laterality: Left;   BREAST LUMPECTOMY Left 07/2020   BREAST LUMPECTOMY WITH RADIOACTIVE SEED AND SENTINEL LYMPH NODE BIOPSY Left 07/26/2020   Procedure: LEFT BREAST LUMPECTOMY WITH RADIOACTIVE SEED AND SENTINEL LYMPH NODE BIOPSY;  Surgeon: Abigail Miyamoto, MD;  Location: [facility];  Service: General;  Laterality: Left;   CESAREAN SECTION  1986   COLONOSCOPY     DIAGNOSTIC LAPAROSCOPY  1985   ESOPHAGOGASTRODUODENOSCOPY N/A 02/21/2023   Procedure: ESOPHAGOGASTRODUODENOSCOPY (EGD);  Surgeon: Hilarie Fredrickson, MD;  Location: [facility];  Service: Gastroenterology;  Laterality: N/A;   HARDWARE REMOVAL Left 04/30/2022   Procedure: HARDWARE REMOVAL LEFT FEMUR;  Surgeon: Samson Frederic, MD;  Location: [facility];  Service: Orthopedics;  Laterality: Left;  150   HARDWARE REMOVAL Left 11/04/2022   Procedure: HARDWARE REMOVAL INFECTED LEFT TOTAL KNEE ARTHROPLASTY;  Surgeon: Nadara Mustard, MD;  Location: [facility] OR;  Service: Orthopedics;  Laterality: Left;  I & D KNEE WITH POLY EXCHANGE Left 06/07/2022   Procedure: IRRIGATION AND DEBRIDEMENT KNEE WITH POLY EXCHANGE;  Surgeon: Samson Frederic, MD;  Location: [facility];  Service: Orthopedics;  Laterality: Left;   INCISION AND DRAINAGE OF WOUND Left 06/04/2022   Procedure: IRRIGATION AND DEBRIDEMENT KNEE;  Surgeon: Samson Frederic, MD;  Location: [facility];  Service: Orthopedics;  Laterality: Left;  60   INCISION AND DRAINAGE OF WOUND Left 06/15/2022   Procedure: excision of knee wound with Myriad;  Surgeon: Peggye Form, DO;  Location: [facility];  Service: Plastics;  Laterality: Left;   ORIF FEMUR FRACTURE Left 04/10/2021   Procedure: OPEN REDUCTION INTERNAL FIXATION (ORIF) DISTAL FEMUR FRACTURE;  Surgeon: Myrene Galas, MD;  Location: [facility] OR;  Service:  Orthopedics;  Laterality: Left;   PARTIAL KNEE ARTHROPLASTY Left 04/30/2022   Procedure: DISTAL FEMUR REPLACEMENT;  Surgeon: Samson Frederic, MD;  Location: [facility];  Service: Orthopedics;  Laterality: Left;  150   Patient Active Problem List   Diagnosis Date Noted   Left femoral vein DVT (HCC) 02/20/2023   GERD (gastroesophageal reflux disease) 02/20/2023   Acute upper gastrointestinal bleeding 02/19/2023   Bipolar 1 disorder, depressed, partial remission (HCC) 11/18/2022   Left above-knee amputee (HCC) 11/13/2022   Infection of total knee replacement (HCC) 11/04/2022   S/P AKA (above knee amputation) unilateral, left (HCC) 11/04/2022   Chronic hepatitis C with cirrhosis (HCC) 06/06/2022   Surgical wound dehiscence, initial encounter 06/04/2022   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, left, with nonunion, subsequent encounter 04/30/2022   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 04/30/2022   Hypokalemia 04/16/2021   Hip fracture requiring operative repair (HCC) 04/16/2021   Vitamin D deficiency 04/12/2021   Hyponatremia 04/11/2021   Acute postoperative anemia due to expected blood loss 04/11/2021   Class 1 obesity 04/11/2021   Left comminuted and displaced fracture of the distal femur metaphysis 04/08/2021   History of CVA (cerebrovascular accident) 04/08/2021   Genetic testing 07/17/2020   Family history of breast cancer 07/10/2020   Family history of ovarian cancer 07/10/2020   Family history of colon cancer 07/10/2020   Malignant neoplasm of upper-outer quadrant of left breast in female, estrogen receptor positive (HCC) 07/09/2020   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   Chronic pain syndrome 10/06/2012   Nicotine dependence 10/06/2012   Anxiety state 10/05/2012   Bipolar disorder, unspecified (HCC) 10/05/2012   Slurred speech 10/04/2012   Left-sided weakness 10/04/2012   Hyperkalemia 10/04/2012   Acute  CVA (cerebrovascular accident) (HCC) 10/04/2012   Essential hypertension     PCP: Quitman Livings, MD  REFERRING PROVIDER: Aldean Baker, MD  ONSET DATE: 01/25/2023 prosthesis delivery  REFERRING DIAG: U44.034 (ICD-10-CM) - Hx of AKA (above knee amputation), left   THERAPY DIAG:  Other abnormalities of gait and mobility  Unsteadiness on feet  History of falling  Muscle weakness (generalized)  Chronic pain of right knee  Abnormal posture  Rationale for Evaluation and Treatment: Rehabilitation  SUBJECTIVE:   SUBJECTIVE STATEMENT: She is still only wearing prosthesis only up to 6 hours.  No falls in last 2 weeks.  Pt accompanied by: [name], significant other  PERTINENT HISTORY: AKA, anemia, anxiety, arthritis, Bipolar disorder, Breast CA left with radiation, HTN, Hep C, liver cirrhosis, Neuromuscular disorder, osteopenia, stroke Left hemiparesis 2014  PAIN:  Are you having pain? No pain Pain location: residual limb Pain description: soreness Aggravating factors: standing or walking too much, or twisting motion  Relieving factors: resting  PRECAUTIONS: Fall  WEIGHT BEARING RESTRICTIONS: No  FALLS: Has patient fallen in last 6 months? Yes. Number of falls 1 no injuries  LIVING ENVIRONMENT: Lives with: lives with their spouse Lives in: Mobile home Home Access: Ramped entrance or 4 steps with double rail to [employer] layout: One level Has following equipment at home: Single point cane, Environmental consultant - 2 wheeled, Wheelchair (manual), Shower bench, bed side commode, Grab bars, and Ramped entry  OCCUPATION: retired from nursing  PLOF: Independent prior to Fracture 04/10/2021.  Since fracture was able to get to cane for community until hardware broke.    PATIENT GOALS:  walk independently in community, travel, drive, take care of 4yo great granddaughter  OBJECTIVE:  COGNITION: Evaluation on 01/25/2023:  Overall cognitive status: Within functional limits for tasks  assessed  POSTURE:  04/21/2023:  standing with rounded shoulders, forward head, flexed trunk , and weight shift right  Evaluation on 01/25/2023: rounded shoulders, forward head, flexed trunk , and weight shift right  LOWER EXTREMITY ROM:  ROM P:passive  A:active Right eval Left eval  Hip flexion    Hip extension  12deg  Hip abduction    Hip adduction    Hip internal rotation    Hip external rotation    Knee flexion    Knee extension    Ankle dorsiflexion    Ankle plantarflexion    Ankle inversion    Ankle eversion     (Blank rows = not tested)  LOWER EXTREMITY MMT:  MMT Right eval Left eval  Hip flexion    Hip extension    Hip abduction    Hip adduction    Hip internal rotation    Hip external rotation    Knee flexion    Knee extension    Ankle dorsiflexion    Ankle plantarflexion    Ankle inversion    Ankle eversion    At Evaluation all strength testing is grossly seated and functionally standing / gait. (Blank rows = not tested)  TRANSFERS: 04/21/2023:  Sit to stand: SBA w/c to RW requires without armrests pushing off w/c seat and stabilization without UE support but RW close for safety.   Stand to sit: SBA RW to position feet, then able to sit down using armrests on w/c to control descent.  Evaluation on 01/25/2023:  Sit to stand: SBA w/c to RW requires armrests on w/c and stabilization with RW.   Stand to sit: SBA RW to w/c requires armrests on w/c and stabilization with RW.    FUNCTIONAL TESTs:  05/12/2023: Sharlene Motts balance 28/56  04/21/2023: TUG with RW: 58.28sec Berg balance: 28/56   03/02/2023: Sharlene Motts Balance 22/56  Eval: Berg Balance Scale: 15/56   GAIT: 05/10/2023:    Max tolerable distance: Patient ambulated 155' with rolling walker with close supervision.  04/20/2023: gait velocity with RW: 0.51 ft/sec Pt able to amb with RW & Transfemoral prosthesis with supervision / requiring verbal cues for gait deviations.    03/18/2023: -Patient ambulated  117ft and performing a 180deg turn CGA for patient safety with intermittent SBA  01/26/2023: Patient ambulated 10 feet with rolling walker with minA but required constant cueing for technique and safety.  PALPATION: 03/18/2023: patient verbalized seeing areas of discoloration on residual limb a few days prior. Therapist palpation and examination of area, did not see any noticeable areas with patient stating that the areas must have gone away without issue.    CURRENT PROSTHETIC WEAR ASSESSMENT: 04/21/2023:  Patient  is independent with: skin check, residual limb care, care of non-amputated limb, prosthetic cleaning, ply sock cleaning, correct ply sock adjustment, proper wear schedule/adjustment, and proper weight-bearing schedule/adjustment Donning prosthesis: modified independent Doffing prosthesis: modified independent Prosthetic wear tolerance: tolerated wear of prosthesis daily 6-7 hours but has decreased over last few days due to limb pain with recent fall.  Prosthetic weight bearing: pt tolerated standing 10 min for Berg Balance with partial weight on prosthesis.   Evaluation on 01/25/2023:  Patient is dependent with: skin check, residual limb care, care of non-amputated limb, prosthetic cleaning, ply sock cleaning, correct ply sock adjustment, proper wear schedule/adjustment, and proper weight-bearing schedule/adjustment Donning prosthesis: Min A Doffing prosthesis: SBA Prosthetic wear tolerance: tolerated wear 1 hour prior to PT evaluation.  Prosthetic weight bearing tolerance: 5 minutes with BUE support on RW with limited wear on prosthesis.  Edema: pitting Prosthetic description: Ischial Containment socket with flexible inner socket, silicon liner with velcro strap suspension.    TODAY'S TREATMENT:                                                                                                                             DATE:  05/17/2023: Prosthetic Training with Transfemoral  Prosthesis: Pt donned prosthesis and tightened suspension strap correctly. PT cued on need to be measured when requesting new liners to ensure they are proper size. PT reviewed higher fall risk when prosthesis is off. So recommend donning upon arising if not getting dressed yet.  Removing prosthesis for 1-2 hours during her midday (~2-3:00) then putting it back on limb for remainder of her day.   Pt verbalized understanding.   PT reviewed position of feet with stationary activities to facilitate prosthetic knee stability.  Pt performed throughout session with occasional reminders.  PT demo standing at counter with abdomen against counter.  She was able to wash & dry hands (no hand support) and reach into overhead counter handing water bottle to her fiance safely.   Pt amb 120' with rollator walker including neg around furniture with supervision.  Sit to/from stand rollator seat 3 reps with verbal cues only.  Pt neg 6.5" curb & 12* incline with rollator walker with supervision / verbal cues.     TREATMENT:  DATE:  05/12/2023: Prosthetic Training with Transfemoral Prosthesis: PT demo and verbal cues on use of rollator walker including rationale the rivet improved her ability to rest for community activities and transport items from one location to another.  Patient instructed in right management for safety, technique for walking, sit to / from stand from a chair and from rollator seat, negotiating ramp and negotiating a curb. Patient ambulated 50 feet and 10' x 2 with rollator walker with contact-guard assist.  Patient improved fluency as she progressed through the session. Patient able to turn to sit on and off the rollator seat with contact-guard assist and cues. Patient able to perform sit to/from stand from 18" chair without armrest using BUEs on chair seat to lock rollator walker  with supervision. Patient negotiated 6.5" curb with rollator walker with minA and verbal cues. Patient negotiated 12*incline with rollator walker with min a and verbal cues  Physical performance test See Berg balance test in objective data   TREATMENT:                                                                                                                             DATE:  05/10/2023: Prosthetic Training with Transfemoral Prosthesis: PT reviewed donning again.  Her liner was only about 1/2 inch lower than it should be.  She requires verbal and minA for fully tightening the suspension strap.  PT reviewed adjusting ply socks as patient is using more socks than necessary thinking that his padding.  PT instructed that is more based on making sure her limb is seated the correct depth down into the socket based on limb size.  Patient verbalized a general understanding but will require further education to fully get this concept. Patient ambulated 155' with rolling walker with PT close supervision.  She had 1 mild loss of balance requiring min assist to stabilize.  PT instructed patient's fianc how to follow with the wheelchair and provide appropriate supervision.  He verbalized understanding. PT instructed patient and fianc in short (room to room) with goal to increase frequency throughout the day until she is not using the wheelchair in the home; long (her max tolerable distance) 1-2 times per day with her fianc following with the wheelchair; medium (a distance longer than short but not max or her long distance) goal is 4 or more per day.  This is designed to improve her activity tolerance and progress how far she can walk her activities. Patient able to go sit to and from stand using armrest on her transport wheelchair without touching the rolling walker for external support 5 reps. PT demo how to move a chair like sliding a chair under the dining room table including turning the chair.  Patient  able to return demonstration scooting the chair forward 3 reps, backwards 3 reps and turning the chair 90 degrees.  Patient reports that she feels she can do this at home to begin eating at the dining room  table without sitting in the wheelchair. Patient able to position prosthesis in stable stationary position with prosthetic foot back about 2 inches from sound foot.  She was able to manage her pants simulate what she would do in the bathroom.  PT recommended with fianc supervision that she began to stand to manage her close for toileting and washing her hands to exit the bathroom.  Patient and fianc verbalized understanding     HOME EXERCISE PROGRAM: Access Code: [email or web address] URL: [email or web address] Date: 04/12/2023 Prepared by: Vladimir Faster   Exercises - Seated Table Hamstring Stretch  - 1-3 x daily - 7 x weekly - 1 sets - 2-3 reps - 20-30 seconds hold - Modified Thomas Stretch  - 2 x daily - 7 x weekly - 1 sets - 2-3 reps - 30 seconds hold - Hip Flexor Stretch at [place] of Bed  - 2 x daily - 7 x weekly - 1 sets - 2-3 reps - 30 seconds hold - Standing posture with back to counter  - 2 x daily - 7 x weekly - 1 sets - 1 reps - 5-10 minutes hold - Upright Stance at Door Frame Single Arm  - 3-6 x daily - 7 x weekly - 1 sets - 2 reps - 2 deep breathes hold - Upright Stance at Door Frame with Both Arms  - 3-6 x daily - 7 x weekly - 1 sets - 2 reps - 2 deep breathes hold [place]  Patient Portal      Do each exercise 1-2  times per day Do each exercise 5-10 repetitions Hold each exercise for 2 seconds to feel your location  AT SINK FIND YOUR MIDLINE POSITION AND PLACE FEET EQUAL DISTANCE FROM THE MIDLINE.  Try to find this position when standing still for activities.   USE TAPE ON FLOOR TO MARK THE MIDLINE POSITION which is even with middle of sink.  You also should try to feel with your limb pressure in socket.  You are trying to feel with limb what you used to feel with  the bottom of your foot.  Side to Side Shift: Moving your hips only (not shoulders): move weight onto your left leg, HOLD/FEEL pressure in socket.  Move back to equal weight on each leg, HOLD/FEEL pressure in socket. Move weight onto your right leg, HOLD/FEEL pressure in socket. Move back to equal weight on each leg, HOLD/FEEL pressure in socket. Repeat.  Start with both hands on sink, progress to hand on prosthetic side only, then no hands.  Front to Back Shift: Moving your hips only (not shoulders): move your weight forward onto your toes, HOLD/FEEL pressure in socket. Move your weight back to equal Flat Foot on both legs, HOLD/FEEL  pressure in socket. Move your weight back onto your heels, HOLD/FEEL  pressure in socket. Move your weight back to equal on both legs, HOLD/FEEL  pressure in socket. Repeat.  Start with both hands on sink, progress to hand on prosthetic side only, then no hands.  Moving Cones / Cups: With equal weight on each leg: Hold on with one hand the first time, then progress to no hand supports. Move cups from one side of sink to the other. Place cups ~2" out of your reach, progress to 10" beyond reach.  Place one hand in middle of sink and reach with other hand. Do both arms.  Then hover one hand and move cups with other hand.  Overhead/Upward Reaching: alternated reaching up to top cabinets or  ceiling if no cabinets present. Keep equal weight on each leg. Start with one hand support on counter while other hand reaches and progress to no hand support with reaching.  ace one hand in middle of sink and reach with other hand. Do both arms.  Then hover one hand and move cups with other hand.  5.   Looking Over Shoulders: With equal weight on each leg: alternate turning to look over your shoulders with one hand support on counter as needed.  Start with head motions only to look in front of shoulder, then even with shoulder and progress to looking behind you. To look to side, move head /eyes,  then shoulder on side looking pulls back, shift more weight to side looking and pull hip back. Place one hand in middle of sink and let go with other hand so your shoulder can pull back. Switch hands to look other way.   Then hover one hand and look over shoulder. If looking right, use left hand at sink. If looking left, use right hand at sink. 6.  Stepping with leg that is not amputated:  Move items under cabinet out of your way. Shift your hips/pelvis so weight on prosthesis. Tighten muscles in hip on prosthetic side.  SLOWLY step other leg so front of foot is in cabinet. Then step back to floor.    ASSESSMENT:  CLINICAL IMPRESSION: Patient appears will be safe using rollator walker and will be more functional.  She needs one more session working on rollator with PT.  Patient will benefit from continued skilled physical therapy to address deficits and improve overall function of prosthesis.   OBJECTIVE IMPAIRMENTS: Abnormal gait, decreased activity tolerance, decreased balance, decreased endurance, decreased knowledge of condition, decreased knowledge of use of DME, decreased mobility, difficulty walking, decreased ROM, decreased strength, increased edema, impaired flexibility, postural dysfunction, prosthetic dependency , obesity, and pain.   ACTIVITY LIMITATIONS: carrying, lifting, bending, sitting, standing, stairs, transfers, and locomotion level  PARTICIPATION LIMITATIONS: meal prep, cleaning, and community activity  PERSONAL FACTORS: Fitness, Time since onset of injury/illness/exacerbation, and 3+ comorbidities: see PMH  are also affecting patient's functional outcome.   REHAB POTENTIAL: Good  CLINICAL DECISION MAKING: Evolving/moderate complexity  EVALUATION COMPLEXITY: Moderate   GOALS: Goals reviewed with patient? Yes  SHORT TERM GOALS: Target date: 05/20/2023  Patient reports no falls for 2 week time period.  Baseline: SEE OBJECTIVE DATA Goal status: MET 05/17/2023 2.   Patient reports tolerating prosthesis >12 hrs total /day without skin issues or limb pain after standing. Baseline: SEE OBJECTIVE DATA Goal status: Ongoing    05/17/2023  3.  Berg Balance >/= 32/56 Baseline: SEE OBJECTIVE DATA Goal status: Ongoing    05/17/2023  4. Patient ambulates 150' with RW & prosthesis with supervision / verbal cues only for gait deviations. Baseline: SEE OBJECTIVE DATA Goal status: Ongoing   05/17/2023  5. Patient negotiates ramps & curbs with RW & prosthesis with supervision. Baseline: SEE OBJECTIVE DATA Goal status: MET  05/17/2023   UPDATED LONG TERM GOALS: Target date: 07/20/2023  Patient demonstrates & verbalized understanding of prosthetic care to enable safe utilization of prosthesis. Baseline: SEE OBJECTIVE DATA Goal status: Ongoing    05/17/2023  Patient tolerates prosthesis wear >90% of awake hours without skin or residual limb pain issues. Baseline: SEE OBJECTIVE DATA Goal status: Ongoing    05/17/2023  Berg Balance >36/56 to indicate lower fall risk Baseline: SEE OBJECTIVE DATA Goal status: Ongoing    05/17/2023  Patient ambulates >  300' with prosthesis and LRAD modified independent. Baseline: SEE OBJECTIVE DATA Goal status: Ongoing   05/17/2023  Patient negotiates ramps, curbs & stairs with single rail with prosthesis  and LRAD modified independent. Baseline: SEE OBJECTIVE DATA Goal status: Ongoing  05/17/2023   Patient reports right knee pain </= 2/10 with standing and gait activities.   Baseline: SEE OBJECTIVE DATA Goal status:   Ongoing  05/17/2023  PLAN:  PT FREQUENCY:  2x/wk  PT DURATION: 90 days / 13 weeks  PLANNED INTERVENTIONS: 97164- PT Re-evaluation, 97110-Therapeutic exercises, 97530- Therapeutic activity, 97112- Neuromuscular re-education, [phone]- Self Care, 96295- Gait training, [phone]- Prosthetic training, Patient/Family education, Balance training, Stair training, DME instructions, and physical performance testing  PLAN FOR NEXT SESSION:    Continue work with rollator walker to improve safety and community mobility.  Balance activities.  High repetition of education for improved retention.   Vladimir Faster, PT, DPT 05/17/2023, 4:15 PM   Date of referral: 12/22/2022 Referring provider: Aldean Baker, MD Referring diagnosis? K44.010 (ICD-10-CM) - Hx of AKA (above knee amputation), left  Treatment diagnosis? (if different than referring diagnosis)  Other abnormalities of gait and mobility  ICD-10-CM: R26.89    Unsteadiness on feet  ICD-10-CM: R26.81    Muscle weakness (generalized)  ICD-10-CM: M62.81    Chronic pain of right knee  M25.561 & G89.29   History of falling Z91.81  Abnormal Posture  R29.3   What was this (referring dx) caused by? Surgery (Type: AKA)   Nature of Condition: Initial Onset (within last 3 months) for prosthesis delivery              Laterality: Lt   Current Functional Measure Score: Other 05/12/2023 & 04/21/2023 Sharlene Motts Balance 28/56  1/21/2025Sharlene Motts Balance 22/56  at eval Berg was 15/56;     Objective measurements identify impairments when they are compared to normal values, the uninvolved extremity, and prior level of function.             [x]  Yes             []  No   Objective assessment of functional ability: Moderate functional limitations              Briefly describe symptoms: patient requires lots of repetition for education and safety with use of her prosthesis.  Patient has had repetitive falls but these have decreased with PT educating patient.  PT began educating patient on use of rollator walker as this transition should improve her ability for community mobility allowing rest and transporting items.  Patient requires more education to safely utilize a rollator walker.   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     Average pain intensity:             Last 24 hours: 1-5/10 (higher due to recent fall with contusion)             Past week: 1-5/10   How often  does the pt experience symptoms? Constantly   How much have the symptoms interfered with usual daily activities? Extremely   How has condition changed since care began at this facility? Better   In general, how is the patients overall health? Very Good     BACK PAIN (STarT Back Screening Tool) No

## 2023-05-19 ENCOUNTER — Encounter: Payer: Self-pay | Admitting: Physical Therapy

## 2023-05-19 ENCOUNTER — Ambulatory Visit (INDEPENDENT_AMBULATORY_CARE_PROVIDER_SITE_OTHER): Admitting: Physical Therapy

## 2023-05-19 DIAGNOSIS — G8929 Other chronic pain: Secondary | ICD-10-CM

## 2023-05-19 DIAGNOSIS — R2689 Other abnormalities of gait and mobility: Secondary | ICD-10-CM

## 2023-05-19 DIAGNOSIS — M25561 Pain in right knee: Secondary | ICD-10-CM

## 2023-05-19 DIAGNOSIS — R2681 Unsteadiness on feet: Secondary | ICD-10-CM | POA: Diagnosis not present

## 2023-05-19 DIAGNOSIS — Z9181 History of falling: Secondary | ICD-10-CM | POA: Diagnosis not present

## 2023-05-19 DIAGNOSIS — M6281 Muscle weakness (generalized): Secondary | ICD-10-CM | POA: Diagnosis not present

## 2023-05-19 DIAGNOSIS — R293 Abnormal posture: Secondary | ICD-10-CM

## 2023-05-19 NOTE — Therapy (Signed)
 OUTPATIENT PHYSICAL THERAPY PROSTHETIC TREATMENT   Patient Name: Tammy [name] MRN: [id number] DOB:09-Feb-1956, 68 y.o., female Today's Date: 05/19/2023  END OF SESSION:  PT End of Session - 05/19/23 1520     Visit Number 23    Number of Visits 44    Date for PT Re-Evaluation 07/20/23    Authorization Type [employer] Medicare    Authorization Time Period [facility] MEDICARE APPROVED 4 PT VISITS 05/12/23-06/09/2023    Authorization - Visit Number 2    Authorization - Number of Visits 4    Progress Note Due on Visit 27    PT Start Time 1517    PT Stop Time 1600    PT Time Calculation (min) 43 min    Equipment Utilized During Treatment Gait belt    Activity Tolerance Patient tolerated treatment well    Behavior During Therapy WFL for tasks assessed/performed             Past Medical History:  Diagnosis Date   Anemia    hx of   Anxiety    on meds   Arthritis    back/bilateral ankles   Bipolar disorder (HCC)    Breast cancer (HCC) 2022   LEFT breast-radiation   Depression    on meds   Family history of breast cancer 07/10/2020   Family history of colon cancer 07/10/2020   Family history of ovarian cancer 07/10/2020   GERD (gastroesophageal reflux disease)    on meds   Heart murmur    dx by PCP   Hepatitis C    HTN (hypertension)    on meds   Hx of radiation therapy    Liver cirrhosis (HCC)    from hep c   Neuromuscular disorder (HCC)    Osteopenia    Personal history of radiation therapy    Stroke (HCC) 09/2012   slurred speech and LEFT sided weakness/notes 10/04/2012   Past Surgical History:  Procedure Laterality Date   AMPUTATION Left 11/04/2022   Procedure: LEFT ABOVE KNEE AMPUTATION;  Surgeon: Nadara Mustard, MD;  Location: [facility];  Service: Orthopedics;  Laterality: Left;   ANKLE FRACTURE SURGERY Left 2018   APPLICATION OF WOUND VAC Left 06/15/2022   Procedure: APPLICATION OF WOUND VAC;  Surgeon: Peggye Form, DO;  Location: [facility];  Service:  Plastics;  Laterality: Left;   APPLICATION OF WOUND VAC Left 09/14/2022   Procedure: APPLICATION OF WOUND VAC;  Surgeon: Peggye Form, DO;  Location: [facility] OR;  Service: Plastics;  Laterality: Left;   BREAST LUMPECTOMY Left 07/2020   BREAST LUMPECTOMY WITH RADIOACTIVE SEED AND SENTINEL LYMPH NODE BIOPSY Left 07/26/2020   Procedure: LEFT BREAST LUMPECTOMY WITH RADIOACTIVE SEED AND SENTINEL LYMPH NODE BIOPSY;  Surgeon: Abigail Miyamoto, MD;  Location:  SURGERY CENTER;  Service: General;  Laterality: Left;   CESAREAN SECTION  1986   COLONOSCOPY     DIAGNOSTIC LAPAROSCOPY  1985   ESOPHAGOGASTRODUODENOSCOPY N/A 02/21/2023   Procedure: ESOPHAGOGASTRODUODENOSCOPY (EGD);  Surgeon: Hilarie Fredrickson, MD;  Location: [facility];  Service: Gastroenterology;  Laterality: N/A;   HARDWARE REMOVAL Left 04/30/2022   Procedure: HARDWARE REMOVAL LEFT FEMUR;  Surgeon: Samson Frederic, MD;  Location: [facility];  Service: Orthopedics;  Laterality: Left;  150   HARDWARE REMOVAL Left 11/04/2022   Procedure: HARDWARE REMOVAL INFECTED LEFT TOTAL KNEE ARTHROPLASTY;  Surgeon: Nadara Mustard, MD;  Location: [facility] OR;  Service: Orthopedics;  Laterality: Left;   I & D KNEE WITH POLY EXCHANGE  Left 06/07/2022   Procedure: IRRIGATION AND DEBRIDEMENT KNEE WITH POLY EXCHANGE;  Surgeon: Samson Frederic, MD;  Location: [facility];  Service: Orthopedics;  Laterality: Left;   INCISION AND DRAINAGE OF WOUND Left 06/04/2022   Procedure: IRRIGATION AND DEBRIDEMENT KNEE;  Surgeon: Samson Frederic, MD;  Location: [facility];  Service: Orthopedics;  Laterality: Left;  60   INCISION AND DRAINAGE OF WOUND Left 06/15/2022   Procedure: excision of knee wound with Myriad;  Surgeon: Peggye Form, DO;  Location: [facility];  Service: Plastics;  Laterality: Left;   ORIF FEMUR FRACTURE Left 04/10/2021   Procedure: OPEN REDUCTION INTERNAL FIXATION (ORIF) DISTAL FEMUR FRACTURE;  Surgeon: Myrene Galas, MD;  Location: [facility] OR;  Service: Orthopedics;   Laterality: Left;   PARTIAL KNEE ARTHROPLASTY Left 04/30/2022   Procedure: DISTAL FEMUR REPLACEMENT;  Surgeon: Samson Frederic, MD;  Location: [facility];  Service: Orthopedics;  Laterality: Left;  150   Patient Active Problem List   Diagnosis Date Noted   Left femoral vein DVT (HCC) 02/20/2023   GERD (gastroesophageal reflux disease) 02/20/2023   Acute upper gastrointestinal bleeding 02/19/2023   Bipolar 1 disorder, depressed, partial remission (HCC) 11/18/2022   Left above-knee amputee (HCC) 11/13/2022   Infection of total knee replacement (HCC) 11/04/2022   S/P AKA (above knee amputation) unilateral, left (HCC) 11/04/2022   Chronic hepatitis C with cirrhosis (HCC) 06/06/2022   Surgical wound dehiscence, initial encounter 06/04/2022   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, left, with nonunion, subsequent encounter 04/30/2022   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 04/30/2022   Hypokalemia 04/16/2021   Hip fracture requiring operative repair (HCC) 04/16/2021   Vitamin D deficiency 04/12/2021   Hyponatremia 04/11/2021   Acute postoperative anemia due to expected blood loss 04/11/2021   Class 1 obesity 04/11/2021   Left comminuted and displaced fracture of the distal femur metaphysis 04/08/2021   History of CVA (cerebrovascular accident) 04/08/2021   Genetic testing 07/17/2020   Family history of breast cancer 07/10/2020   Family history of ovarian cancer 07/10/2020   Family history of colon cancer 07/10/2020   Malignant neoplasm of upper-outer quadrant of left breast in female, estrogen receptor positive (HCC) 07/09/2020   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   Chronic pain syndrome 10/06/2012   Nicotine dependence 10/06/2012   Anxiety state 10/05/2012   Bipolar disorder, unspecified (HCC) 10/05/2012   Slurred speech 10/04/2012   Left-sided weakness 10/04/2012   Hyperkalemia 10/04/2012   Acute CVA  (cerebrovascular accident) (HCC) 10/04/2012   Essential hypertension     PCP: Quitman Livings, MD  REFERRING PROVIDER: Aldean Baker, MD  ONSET DATE: 01/25/2023 prosthesis delivery  REFERRING DIAG: Z61.096 (ICD-10-CM) - Hx of AKA (above knee amputation), left   THERAPY DIAG:  Other abnormalities of gait and mobility  Unsteadiness on feet  History of falling  Muscle weakness (generalized)  Chronic pain of right knee  Abnormal posture  Rationale for Evaluation and Treatment: Rehabilitation  SUBJECTIVE:   SUBJECTIVE STATEMENT: She ordered an ultralight weight rollator walker with 10" wheels and folds side to side.  She saw prosthetist who is starting smaller socket.   Pt accompanied by: [name], significant other  PERTINENT HISTORY: AKA, anemia, anxiety, arthritis, Bipolar disorder, Breast CA left with radiation, HTN, Hep C, liver cirrhosis, Neuromuscular disorder, osteopenia, stroke Left hemiparesis 2014  PAIN:  Are you having pain? No pain Pain location: residual limb Pain description: soreness Aggravating factors: standing or walking too much, or twisting motion  Relieving factors: resting  PRECAUTIONS: Fall  WEIGHT BEARING RESTRICTIONS: No  FALLS: Has patient fallen in last 6 months? Yes. Number of falls 1 no injuries  LIVING ENVIRONMENT: Lives with: lives with their spouse Lives in: Mobile home Home Access: Ramped entrance or 4 steps with double rail to [employer] layout: One level Has following equipment at home: Single point cane, Environmental consultant - 2 wheeled, Wheelchair (manual), Shower bench, bed side commode, Grab bars, and Ramped entry  OCCUPATION: retired from nursing  PLOF: Independent prior to Fracture 04/10/2021.  Since fracture was able to get to cane for community until hardware broke.    PATIENT GOALS:  walk independently in community, travel, drive, take care of 4yo great granddaughter  OBJECTIVE:  COGNITION: Evaluation on 01/25/2023:  Overall cognitive  status: Within functional limits for tasks assessed  POSTURE:  04/21/2023:  standing with rounded shoulders, forward head, flexed trunk , and weight shift right  Evaluation on 01/25/2023: rounded shoulders, forward head, flexed trunk , and weight shift right  LOWER EXTREMITY ROM:  ROM P:passive  A:active Right eval Left eval  Hip flexion    Hip extension  12deg  Hip abduction    Hip adduction    Hip internal rotation    Hip external rotation    Knee flexion    Knee extension    Ankle dorsiflexion    Ankle plantarflexion    Ankle inversion    Ankle eversion     (Blank rows = not tested)  LOWER EXTREMITY MMT:  MMT Right eval Left eval  Hip flexion    Hip extension    Hip abduction    Hip adduction    Hip internal rotation    Hip external rotation    Knee flexion    Knee extension    Ankle dorsiflexion    Ankle plantarflexion    Ankle inversion    Ankle eversion    At Evaluation all strength testing is grossly seated and functionally standing / gait. (Blank rows = not tested)  TRANSFERS: 04/21/2023:  Sit to stand: SBA w/c to RW requires without armrests pushing off w/c seat and stabilization without UE support but RW close for safety.   Stand to sit: SBA RW to position feet, then able to sit down using armrests on w/c to control descent.  Evaluation on 01/25/2023:  Sit to stand: SBA w/c to RW requires armrests on w/c and stabilization with RW.   Stand to sit: SBA RW to w/c requires armrests on w/c and stabilization with RW.    FUNCTIONAL TESTs:  05/12/2023: Sharlene Motts balance 28/56  04/21/2023: TUG with RW: 58.28sec Berg balance: 28/56   03/02/2023: Sharlene Motts Balance 22/56  Eval: Berg Balance Scale: 15/56   GAIT: 05/10/2023:    Max tolerable distance: Patient ambulated 155' with rolling walker with close supervision.  04/20/2023: gait velocity with RW: 0.51 ft/sec Pt able to amb with RW & Transfemoral prosthesis with supervision / requiring verbal cues for gait  deviations.    03/18/2023: -Patient ambulated 137ft and performing a 180deg turn CGA for patient safety with intermittent SBA  01/26/2023: Patient ambulated 10 feet with rolling walker with minA but required constant cueing for technique and safety.  PALPATION: 03/18/2023: patient verbalized seeing areas of discoloration on residual limb a few days prior. Therapist palpation and examination of area, did not see any noticeable areas with patient stating that the areas must have gone away without issue.    CURRENT PROSTHETIC WEAR ASSESSMENT: 04/21/2023:  Patient  is independent with: skin check, residual limb care, care of non-amputated limb, prosthetic cleaning, ply sock cleaning, correct ply sock adjustment, proper wear schedule/adjustment, and proper weight-bearing schedule/adjustment Donning prosthesis: modified independent Doffing prosthesis: modified independent Prosthetic wear tolerance: tolerated wear of prosthesis daily 6-7 hours but has decreased over last few days due to limb pain with recent fall.  Prosthetic weight bearing: pt tolerated standing 10 min for Berg Balance with partial weight on prosthesis.   Evaluation on 01/25/2023:  Patient is dependent with: skin check, residual limb care, care of non-amputated limb, prosthetic cleaning, ply sock cleaning, correct ply sock adjustment, proper wear schedule/adjustment, and proper weight-bearing schedule/adjustment Donning prosthesis: Min A Doffing prosthesis: SBA Prosthetic wear tolerance: tolerated wear 1 hour prior to PT evaluation.  Prosthetic weight bearing tolerance: 5 minutes with BUE support on RW with limited wear on prosthesis.  Edema: pitting Prosthetic description: Ischial Containment socket with flexible inner socket, silicon liner with velcro strap suspension.    TODAY'S TREATMENT:                                                                                                                             DATE:   05/19/2023: Prosthetic Training with Transfemoral Prosthesis: Patient able to stand without UE support with locked rollator walker close for safety and manage her pants safely. Patient ambulated 120' & 1' with a rollator walker safely.  She had 1 episode of decreased prosthetic knee control that PT had to provide min a to prevent a fall. Patient able to perform sit to from stand rollator seat safely with supervision and light verbal cues. Patient negotiated 6.5" curb with locked rollator walker with contact-guard assist and verbal cues. Patient associated 12*inclined with locked rollator walker with contact-guard assist and verbal cues. PT demo picking up an object off the floor keeping prosthetic knee extended so it can aid with balance.  Patient places left upper extremity on rollator seat and reaches to the floor with right upper extremity with prosthetic leg in an abducted position.  Patient performed 2 reps with supervision. Patient negotiated 4 steps with BUEs on left rail sidestepping technique with min assistance.  PT demo verbal cues on technique including moving rollator walker to the top or bottom of steps prior to negotiating them.  Patient and her fianc verbalized understanding.    TREATMENT:  DATE:  05/17/2023: Prosthetic Training with Transfemoral Prosthesis: Pt donned prosthesis and tightened suspension strap correctly. PT cued on need to be measured when requesting new liners to ensure they are proper size. PT reviewed higher fall risk when prosthesis is off. So recommend donning upon arising if not getting dressed yet.  Removing prosthesis for 1-2 hours during her midday (~2-3:00) then putting it back on limb for remainder of her day.   Pt verbalized understanding.   PT reviewed position of feet with stationary activities to facilitate prosthetic knee stability.   Pt performed throughout session with occasional reminders.  PT demo standing at counter with abdomen against counter.  She was able to wash & dry hands (no hand support) and reach into overhead counter handing water bottle to her fiance safely.   Pt amb 120' with rollator walker including neg around furniture with supervision.  Sit to/from stand rollator seat 3 reps with verbal cues only.  Pt neg 6.5" curb & 12* incline with rollator walker with supervision / verbal cues.     TREATMENT:                                                                                                                             DATE:  05/12/2023: Prosthetic Training with Transfemoral Prosthesis: PT demo and verbal cues on use of rollator walker including rationale the rivet improved her ability to rest for community activities and transport items from one location to another.  Patient instructed in right management for safety, technique for walking, sit to / from stand from a chair and from rollator seat, negotiating ramp and negotiating a curb. Patient ambulated 50 feet and 10' x 2 with rollator walker with contact-guard assist.  Patient improved fluency as she progressed through the session. Patient able to turn to sit on and off the rollator seat with contact-guard assist and cues. Patient able to perform sit to/from stand from 18" chair without armrest using BUEs on chair seat to lock rollator walker with supervision. Patient negotiated 6.5" curb with rollator walker with minA and verbal cues. Patient negotiated 12*incline with rollator walker with min a and verbal cues  Physical performance test See Berg balance test in objective data    HOME EXERCISE PROGRAM: Access Code: [email or web address] URL: [email or web address] Date: 04/12/2023 Prepared by: Vladimir Faster   Exercises - Seated Table Hamstring Stretch  - 1-3 x daily - 7 x weekly - 1 sets - 2-3 reps - 20-30 seconds hold - Modified Thomas Stretch   - 2 x daily - 7 x weekly - 1 sets - 2-3 reps - 30 seconds hold - Hip Flexor Stretch at [employer] of Bed  - 2 x daily - 7 x weekly - 1 sets - 2-3 reps - 30 seconds hold - Standing posture with back to counter  - 2 x daily - 7 x weekly - 1 sets - 1 reps - 5-10 minutes hold - Upright Stance at Door [employer]  Single Arm  - 3-6 x daily - 7 x weekly - 1 sets - 2 reps - 2 deep breathes hold - Upright Stance at Door Frame with Both Arms  - 3-6 x daily - 7 x weekly - 1 sets - 2 reps - 2 deep breathes hold [facility]  Patient Portal      Do each exercise 1-2  times per day Do each exercise 5-10 repetitions Hold each exercise for 2 seconds to feel your location  AT SINK FIND YOUR MIDLINE POSITION AND PLACE FEET EQUAL DISTANCE FROM THE MIDLINE.  Try to find this position when standing still for activities.   USE TAPE ON FLOOR TO MARK THE MIDLINE POSITION which is even with middle of sink.  You also should try to feel with your limb pressure in socket.  You are trying to feel with limb what you used to feel with the bottom of your foot.  Side to Side Shift: Moving your hips only (not shoulders): move weight onto your left leg, HOLD/FEEL pressure in socket.  Move back to equal weight on each leg, HOLD/FEEL pressure in socket. Move weight onto your right leg, HOLD/FEEL pressure in socket. Move back to equal weight on each leg, HOLD/FEEL pressure in socket. Repeat.  Start with both hands on sink, progress to hand on prosthetic side only, then no hands.  Front to Back Shift: Moving your hips only (not shoulders): move your weight forward onto your toes, HOLD/FEEL pressure in socket. Move your weight back to equal Flat Foot on both legs, HOLD/FEEL  pressure in socket. Move your weight back onto your heels, HOLD/FEEL  pressure in socket. Move your weight back to equal on both legs, HOLD/FEEL  pressure in socket. Repeat.  Start with both hands on sink, progress to hand on prosthetic side only, then no hands.  Moving Cones  / Cups: With equal weight on each leg: Hold on with one hand the first time, then progress to no hand supports. Move cups from one side of sink to the other. Place cups ~2" out of your reach, progress to 10" beyond reach.  Place one hand in middle of sink and reach with other hand. Do both arms.  Then hover one hand and move cups with other hand.  Overhead/Upward Reaching: alternated reaching up to top cabinets or ceiling if no cabinets present. Keep equal weight on each leg. Start with one hand support on counter while other hand reaches and progress to no hand support with reaching.  ace one hand in middle of sink and reach with other hand. Do both arms.  Then hover one hand and move cups with other hand.  5.   Looking Over Shoulders: With equal weight on each leg: alternate turning to look over your shoulders with one hand support on counter as needed.  Start with head motions only to look in front of shoulder, then even with shoulder and progress to looking behind you. To look to side, move head /eyes, then shoulder on side looking pulls back, shift more weight to side looking and pull hip back. Place one hand in middle of sink and let go with other hand so your shoulder can pull back. Switch hands to look other way.   Then hover one hand and look over shoulder. If looking right, use left hand at sink. If looking left, use right hand at sink. 6.  Stepping with leg that is not amputated:  Move items under cabinet out of your  way. Shift your hips/pelvis so weight on prosthesis. Tighten muscles in hip on prosthetic side.  SLOWLY step other leg so front of foot is in cabinet. Then step back to floor.    ASSESSMENT:  CLINICAL IMPRESSION: Patient's mobility with rollator walker appears significantly safer.  Patient had improved control of prosthetic knee with mobility today.  Patient and her fianc appear to understand sidestepping on stairs to increase access to her home and others.  Patient will benefit  from continued skilled physical therapy to address deficits and improve overall function of prosthesis.   OBJECTIVE IMPAIRMENTS: Abnormal gait, decreased activity tolerance, decreased balance, decreased endurance, decreased knowledge of condition, decreased knowledge of use of DME, decreased mobility, difficulty walking, decreased ROM, decreased strength, increased edema, impaired flexibility, postural dysfunction, prosthetic dependency , obesity, and pain.   ACTIVITY LIMITATIONS: carrying, lifting, bending, sitting, standing, stairs, transfers, and locomotion level  PARTICIPATION LIMITATIONS: meal prep, cleaning, and community activity  PERSONAL FACTORS: Fitness, Time since onset of injury/illness/exacerbation, and 3+ comorbidities: see PMH  are also affecting patient's functional outcome.   REHAB POTENTIAL: Good  CLINICAL DECISION MAKING: Evolving/moderate complexity  EVALUATION COMPLEXITY: Moderate   GOALS: Goals reviewed with patient? Yes  SHORT TERM GOALS: Target date: 05/20/2023  Patient reports no falls for 2 week time period.  Baseline: SEE OBJECTIVE DATA Goal status: MET 05/17/2023 2.  Patient reports tolerating prosthesis >12 hrs total /day without skin issues or limb pain after standing. Baseline: SEE OBJECTIVE DATA Goal status: Ongoing    05/19/2023  3.  Berg Balance >/= 32/56 Baseline: SEE OBJECTIVE DATA Goal status: Ongoing    05/19/2023  4. Patient ambulates 150' with RW & prosthesis with supervision / verbal cues only for gait deviations. Baseline: SEE OBJECTIVE DATA Goal status: Ongoing   05/19/2023  5. Patient negotiates ramps & curbs with RW & prosthesis with supervision. Baseline: SEE OBJECTIVE DATA Goal status: MET  05/17/2023   UPDATED LONG TERM GOALS: Target date: 07/20/2023  Patient demonstrates & verbalized understanding of prosthetic care to enable safe utilization of prosthesis. Baseline: SEE OBJECTIVE DATA Goal status: Ongoing    05/17/2023  Patient  tolerates prosthesis wear >90% of awake hours without skin or residual limb pain issues. Baseline: SEE OBJECTIVE DATA Goal status: Ongoing    05/17/2023  Berg Balance >36/56 to indicate lower fall risk Baseline: SEE OBJECTIVE DATA Goal status: Ongoing    05/17/2023  Patient ambulates >300' with prosthesis and LRAD modified independent. Baseline: SEE OBJECTIVE DATA Goal status: Ongoing   05/17/2023  Patient negotiates ramps, curbs & stairs with single rail with prosthesis  and LRAD modified independent. Baseline: SEE OBJECTIVE DATA Goal status: Ongoing  05/17/2023   Patient reports right knee pain </= 2/10 with standing and gait activities.   Baseline: SEE OBJECTIVE DATA Goal status:   Ongoing  05/17/2023  PLAN:  PT FREQUENCY:  2x/wk  PT DURATION: 90 days / 13 weeks  PLANNED INTERVENTIONS: 97164- PT Re-evaluation, 97110-Therapeutic exercises, 97530- Therapeutic activity, 97112- Neuromuscular re-education, [phone]- Self Care, 60454- Gait training, [phone]- Prosthetic training, Patient/Family education, Balance training, Stair training, DME instructions, and physical performance testing  PLAN FOR NEXT SESSION: Reduce frequency to 1 time per week, check her rollator walker with gait.  Set up HEP to work on balance activities.  High repetition of education for improved retention.   Vladimir Faster, PT, DPT 05/19/2023, 4:27 PM   Date of referral: 12/22/2022 Referring provider: Aldean Baker, MD Referring diagnosis? B14.782 (ICD-10-CM) - Hx of AKA (above knee  amputation), left  Treatment diagnosis? (if different than referring diagnosis)  Other abnormalities of gait and mobility  ICD-10-CM: R26.89    Unsteadiness on feet  ICD-10-CM: R26.81    Muscle weakness (generalized)  ICD-10-CM: M62.81    Chronic pain of right knee  M25.561 & G89.29   History of falling Z91.81  Abnormal Posture  R29.3   What was this (referring dx) caused by? Surgery (Type: AKA)   Nature of Condition: Initial Onset  (within last 3 months) for prosthesis delivery              Laterality: Lt   Current Functional Measure Score: Other 05/12/2023 & 04/21/2023 Sharlene Motts Balance 28/56  1/21/2025Sharlene Motts Balance 22/56  at eval Berg was 15/56;     Objective measurements identify impairments when they are compared to normal values, the uninvolved extremity, and prior level of function.             [x]  Yes             []  No   Objective assessment of functional ability: Moderate functional limitations              Briefly describe symptoms: patient requires lots of repetition for education and safety with use of her prosthesis.  Patient has had repetitive falls but these have decreased with PT educating patient.  PT began educating patient on use of rollator walker as this transition should improve her ability for community mobility allowing rest and transporting items.  Patient requires more education to safely utilize a rollator walker.   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     Average pain intensity:             Last 24 hours: 1-5/10 (higher due to recent fall with contusion)             Past week: 1-5/10   How often does the pt experience symptoms? Constantly   How much have the symptoms interfered with usual daily activities? Extremely   How has condition changed since care began at this facility? Better   In general, how is the patients overall health? Very Good     BACK PAIN (STarT Back Screening Tool) No

## 2023-05-24 ENCOUNTER — Telehealth: Payer: Self-pay

## 2023-05-24 ENCOUNTER — Encounter: Admitting: Physical Therapy

## 2023-05-24 MED ORDER — DULOXETINE HCL 20 MG PO CPEP: 20.0000 mg | ORAL_CAPSULE | Freq: Every day | ORAL | 3 refills | Status: DC

## 2023-05-24 MED ORDER — DULOXETINE HCL 30 MG PO CPEP: 30.0000 mg | ORAL_CAPSULE | Freq: Every day | ORAL | 3 refills | Status: DC

## 2023-05-24 NOTE — Telephone Encounter (Signed)
 Requesting increase in Cymbalta

## 2023-05-26 ENCOUNTER — Ambulatory Visit: Admitting: Physical Therapy

## 2023-05-26 ENCOUNTER — Encounter: Payer: Self-pay | Admitting: Physical Therapy

## 2023-05-26 DIAGNOSIS — R2681 Unsteadiness on feet: Secondary | ICD-10-CM

## 2023-05-26 DIAGNOSIS — R2689 Other abnormalities of gait and mobility: Secondary | ICD-10-CM | POA: Diagnosis not present

## 2023-05-26 DIAGNOSIS — M6281 Muscle weakness (generalized): Secondary | ICD-10-CM

## 2023-05-26 DIAGNOSIS — Z9181 History of falling: Secondary | ICD-10-CM | POA: Diagnosis not present

## 2023-05-26 DIAGNOSIS — G8929 Other chronic pain: Secondary | ICD-10-CM

## 2023-05-26 DIAGNOSIS — R293 Abnormal posture: Secondary | ICD-10-CM

## 2023-05-26 DIAGNOSIS — M25561 Pain in right knee: Secondary | ICD-10-CM

## 2023-05-26 NOTE — Therapy (Signed)
 OUTPATIENT PHYSICAL THERAPY PROSTHETIC TREATMENT   Patient Name: Tammy [name] MRN: [id number] DOB:01/05/1956, 68 y.o., female Today's Date: 05/26/2023  END OF SESSION:  PT End of Session - 05/26/23 1519     Visit Number 24    Number of Visits 44    Date for PT Re-Evaluation 07/20/23    Authorization Type [employer] Medicare    Authorization Time Period [facility] MEDICARE APPROVED 4 PT VISITS 05/12/23-06/09/2023    Authorization - Visit Number 3    Authorization - Number of Visits 4    Progress Note Due on Visit 27    PT Start Time 1515    PT Stop Time 1556    PT Time Calculation (min) 41 min    Equipment Utilized During Treatment Gait belt    Activity Tolerance Patient tolerated treatment well    Behavior During Therapy WFL for tasks assessed/performed              Past Medical History:  Diagnosis Date   Anemia    hx of   Anxiety    on meds   Arthritis    back/bilateral ankles   Bipolar disorder (HCC)    Breast cancer (HCC) 2022   LEFT breast-radiation   Depression    on meds   Family history of breast cancer 07/10/2020   Family history of colon cancer 07/10/2020   Family history of ovarian cancer 07/10/2020   GERD (gastroesophageal reflux disease)    on meds   Heart murmur    dx by PCP   Hepatitis C    HTN (hypertension)    on meds   Hx of radiation therapy    Liver cirrhosis (HCC)    from hep c   Neuromuscular disorder (HCC)    Osteopenia    Personal history of radiation therapy    Stroke (HCC) 09/2012   slurred speech and LEFT sided weakness/notes 10/04/2012   Past Surgical History:  Procedure Laterality Date   AMPUTATION Left 11/04/2022   Procedure: LEFT ABOVE KNEE AMPUTATION;  Surgeon: Nadara Mustard, MD;  Location: [facility];  Service: Orthopedics;  Laterality: Left;   ANKLE FRACTURE SURGERY Left 2018   APPLICATION OF WOUND VAC Left 06/15/2022   Procedure: APPLICATION OF WOUND VAC;  Surgeon: Peggye Form, DO;  Location: [facility];  Service:  Plastics;  Laterality: Left;   APPLICATION OF WOUND VAC Left 09/14/2022   Procedure: APPLICATION OF WOUND VAC;  Surgeon: Peggye Form, DO;  Location: [facility];  Service: Plastics;  Laterality: Left;   BREAST LUMPECTOMY Left 07/2020   BREAST LUMPECTOMY WITH RADIOACTIVE SEED AND SENTINEL LYMPH NODE BIOPSY Left 07/26/2020   Procedure: LEFT BREAST LUMPECTOMY WITH RADIOACTIVE SEED AND SENTINEL LYMPH NODE BIOPSY;  Surgeon: Abigail Miyamoto, MD;  Location: [facility];  Service: General;  Laterality: Left;   CESAREAN SECTION  1986   COLONOSCOPY     DIAGNOSTIC LAPAROSCOPY  1985   ESOPHAGOGASTRODUODENOSCOPY N/A 02/21/2023   Procedure: ESOPHAGOGASTRODUODENOSCOPY (EGD);  Surgeon: Hilarie Fredrickson, MD;  Location: [facility];  Service: Gastroenterology;  Laterality: N/A;   HARDWARE REMOVAL Left 04/30/2022   Procedure: HARDWARE REMOVAL LEFT FEMUR;  Surgeon: Samson Frederic, MD;  Location: [facility];  Service: Orthopedics;  Laterality: Left;  150   HARDWARE REMOVAL Left 11/04/2022   Procedure: HARDWARE REMOVAL INFECTED LEFT TOTAL KNEE ARTHROPLASTY;  Surgeon: Nadara Mustard, MD;  Location: [facility];  Service: Orthopedics;  Laterality: Left;   I & D KNEE WITH POLY  EXCHANGE Left 06/07/2022   Procedure: IRRIGATION AND DEBRIDEMENT KNEE WITH POLY EXCHANGE;  Surgeon: Adonica Hoose, MD;  Location: [facility];  Service: Orthopedics;  Laterality: Left;   INCISION AND DRAINAGE OF WOUND Left 06/04/2022   Procedure: IRRIGATION AND DEBRIDEMENT KNEE;  Surgeon: Adonica Hoose, MD;  Location: [facility];  Service: Orthopedics;  Laterality: Left;  60   INCISION AND DRAINAGE OF WOUND Left 06/15/2022   Procedure: excision of knee wound with Myriad;  Surgeon: Thornell Flirt, DO;  Location: [facility];  Service: Plastics;  Laterality: Left;   ORIF FEMUR FRACTURE Left 04/10/2021   Procedure: OPEN REDUCTION INTERNAL FIXATION (ORIF) DISTAL FEMUR FRACTURE;  Surgeon: Hardy Lia, MD;  Location: [facility];  Service: Orthopedics;   Laterality: Left;   PARTIAL KNEE ARTHROPLASTY Left 04/30/2022   Procedure: DISTAL FEMUR REPLACEMENT;  Surgeon: Adonica Hoose, MD;  Location: [facility];  Service: Orthopedics;  Laterality: Left;  150   Patient Active Problem List   Diagnosis Date Noted   Left femoral vein DVT (HCC) 02/20/2023   GERD (gastroesophageal reflux disease) 02/20/2023   Acute upper gastrointestinal bleeding 02/19/2023   Bipolar 1 disorder, depressed, partial remission (HCC) 11/18/2022   Left above-knee amputee (HCC) 11/13/2022   Infection of total knee replacement (HCC) 11/04/2022   S/P AKA (above knee amputation) unilateral, left (HCC) 11/04/2022   Chronic hepatitis C with cirrhosis (HCC) 06/06/2022   Surgical wound dehiscence, initial encounter 06/04/2022   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, left, with nonunion, subsequent encounter 04/30/2022   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 04/30/2022   Hypokalemia 04/16/2021   Hip fracture requiring operative repair (HCC) 04/16/2021   Vitamin D deficiency 04/12/2021   Hyponatremia 04/11/2021   Acute postoperative anemia due to expected blood loss 04/11/2021   Class 1 obesity 04/11/2021   Left comminuted and displaced fracture of the distal femur metaphysis 04/08/2021   History of CVA (cerebrovascular accident) 04/08/2021   Genetic testing 07/17/2020   Family history of breast cancer 07/10/2020   Family history of ovarian cancer 07/10/2020   Family history of colon cancer 07/10/2020   Malignant neoplasm of upper-outer quadrant of left breast in female, estrogen receptor positive (HCC) 07/09/2020   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   Chronic pain syndrome 10/06/2012   Nicotine dependence 10/06/2012   Anxiety state 10/05/2012   Bipolar disorder, unspecified (HCC) 10/05/2012   Slurred speech 10/04/2012   Left-sided weakness 10/04/2012   Hyperkalemia 10/04/2012   Acute CVA  (cerebrovascular accident) (HCC) 10/04/2012   Essential hypertension     PCP: Wright Heal, MD  REFERRING PROVIDER: Gearldean Keepers, MD  ONSET DATE: 01/25/2023 prosthesis delivery  REFERRING DIAG: V40.981 (ICD-10-CM) - Hx of AKA (above knee amputation), left   THERAPY DIAG:  Other abnormalities of gait and mobility  Unsteadiness on feet  History of falling  Muscle weakness (generalized)  Chronic pain of right knee  Abnormal posture  Rationale for Evaluation and Treatment: Rehabilitation  SUBJECTIVE:   SUBJECTIVE STATEMENT: She got her rollator walker and has been trying to use it.  She fell this morning going to bathroom first thing this morning prior to putting the prosthesis on limb.  She is now able to help get herself up.  She saw prosthetist who is working on socket.  She is wearing prosthesis 3-4 hours 3x/day so it is on limb most of awake hours.    Pt accompanied by: Royston Cornea, significant other  PERTINENT HISTORY: AKA, anemia, anxiety, arthritis,  Bipolar disorder, Breast CA left with radiation, HTN, Hep C, liver cirrhosis, Neuromuscular disorder, osteopenia, stroke Left hemiparesis 2014  PAIN:  Are you having pain? No pain Pain location: residual limb Pain description: soreness Aggravating factors: standing or walking too much, or twisting motion Relieving factors: resting  PRECAUTIONS: Fall  WEIGHT BEARING RESTRICTIONS: No  FALLS: Has patient fallen in last 6 months? Yes. Number of falls 1 no injuries  LIVING ENVIRONMENT: Lives with: lives with their spouse Lives in: Mobile home Home Access: Ramped entrance or 4 steps with double rail to [employer] layout: One level Has following equipment at home: Single point cane, Environmental consultant - 2 wheeled, Wheelchair (manual), Shower bench, bed side commode, Grab bars, and Ramped entry  OCCUPATION: retired from nursing  PLOF: Independent prior to Fracture 04/10/2021.  Since fracture was able to get to cane for community until  hardware broke.    PATIENT GOALS:  walk independently in community, travel, drive, take care of 4yo great granddaughter  OBJECTIVE:  COGNITION: Evaluation on 01/25/2023:  Overall cognitive status: Within functional limits for tasks assessed  POSTURE:  04/21/2023:  standing with rounded shoulders, forward head, flexed trunk , and weight shift right  Evaluation on 01/25/2023: rounded shoulders, forward head, flexed trunk , and weight shift right  LOWER EXTREMITY ROM:  ROM P:passive  A:active Right eval Left eval  Hip flexion    Hip extension  12deg  Hip abduction    Hip adduction    Hip internal rotation    Hip external rotation    Knee flexion    Knee extension    Ankle dorsiflexion    Ankle plantarflexion    Ankle inversion    Ankle eversion     (Blank rows = not tested)  LOWER EXTREMITY MMT:  MMT Right eval Left eval  Hip flexion    Hip extension    Hip abduction    Hip adduction    Hip internal rotation    Hip external rotation    Knee flexion    Knee extension    Ankle dorsiflexion    Ankle plantarflexion    Ankle inversion    Ankle eversion    At Evaluation all strength testing is grossly seated and functionally standing / gait. (Blank rows = not tested)  TRANSFERS: 04/21/2023:  Sit to stand: SBA w/c to RW requires without armrests pushing off w/c seat and stabilization without UE support but RW close for safety.   Stand to sit: SBA RW to position feet, then able to sit down using armrests on w/c to control descent.  Evaluation on 01/25/2023:  Sit to stand: SBA w/c to RW requires armrests on w/c and stabilization with RW.   Stand to sit: SBA RW to w/c requires armrests on w/c and stabilization with RW.    FUNCTIONAL TESTs:  05/12/2023: Randye Buttner balance 28/56  04/21/2023: TUG with RW: 58.28sec Berg balance: 28/56   03/02/2023: Randye Buttner Balance 22/56  Eval: Berg Balance Scale: 15/56   GAIT: 05/10/2023:    Max tolerable distance: Patient ambulated 155'  with rolling walker with close supervision.  04/20/2023: gait velocity with RW: 0.51 ft/sec Pt able to amb with RW & Transfemoral prosthesis with supervision / requiring verbal cues for gait deviations.    03/18/2023: -Patient ambulated 169ft and performing a 180deg turn CGA for patient safety with intermittent SBA  01/26/2023: Patient ambulated 10 feet with rolling walker with minA but required constant cueing for technique and safety.  PALPATION: 03/18/2023: patient verbalized  seeing areas of discoloration on residual limb a few days prior. Therapist palpation and examination of area, did not see any noticeable areas with patient stating that the areas must have gone away without issue.    CURRENT PROSTHETIC WEAR ASSESSMENT: 04/21/2023:  Patient is independent with: skin check, residual limb care, care of non-amputated limb, prosthetic cleaning, ply sock cleaning, correct ply sock adjustment, proper wear schedule/adjustment, and proper weight-bearing schedule/adjustment Donning prosthesis: modified independent Doffing prosthesis: modified independent Prosthetic wear tolerance: tolerated wear of prosthesis daily 6-7 hours but has decreased over last few days due to limb pain with recent fall.  Prosthetic weight bearing: pt tolerated standing 10 min for Berg Balance with partial weight on prosthesis.   Evaluation on 01/25/2023:  Patient is dependent with: skin check, residual limb care, care of non-amputated limb, prosthetic cleaning, ply sock cleaning, correct ply sock adjustment, proper wear schedule/adjustment, and proper weight-bearing schedule/adjustment Donning prosthesis: Min A Doffing prosthesis: SBA Prosthetic wear tolerance: tolerated wear 1 hour prior to PT evaluation.  Prosthetic weight bearing tolerance: 5 minutes with BUE support on RW with limited wear on prosthesis.  Edema: pitting Prosthetic description: Ischial Containment socket with flexible inner socket, silicon liner with  velcro strap suspension.    TODAY'S  TREATMENT:                                                                                                                             DATE:  05/26/2023: Prosthetic Training with Transfemoral Prosthesis: PT assessed patient's new rollator walker that she just purchased.  The walker is set for the proper height and the brakes appeared to be working correctly. Patient ambulated 100' with rollator walker safely including turning to sit on the seat for rest. Patient negotiated 12*ramp and 6.5" curb with rollator walker safely. PT reviewed walking program to improve her activity tolerance with short, medium and long walks.  Patient verbalized understanding  Therapeutic activities: Patient reports fall this morning was more controlled with no bruising or injury.  All falls recently appear to be when the prosthesis wears off. PT demo and verbal cues on a scoot forward transfer from wheelchair to toilet where she would be facing backwards on the toilet.  When she finished using the bathroom then she could scoot backwards directly onto the wheelchair.  This would decrease her chance of falling during the transfer without the prosthesis first thing in the morning.  Patient and fianc verbalized understanding.  Both report they feel like this will decrease her chance of falling significantly.    TREATMENT:  DATE:  05/19/2023: Prosthetic Training with Transfemoral Prosthesis: Patient able to stand without UE support with locked rollator walker close for safety and manage her pants safely. Patient ambulated 120' & 62' with a rollator walker safely.  She had 1 episode of decreased prosthetic knee control that PT had to provide min a to prevent a fall. Patient able to perform sit to from stand rollator seat safely with supervision and light verbal  cues. Patient negotiated 6.5" curb with locked rollator walker with contact-guard assist and verbal cues. Patient associated 12*inclined with locked rollator walker with contact-guard assist and verbal cues. PT demo picking up an object off the floor keeping prosthetic knee extended so it can aid with balance.  Patient places left upper extremity on rollator seat and reaches to the floor with right upper extremity with prosthetic leg in an abducted position.  Patient performed 2 reps with supervision. Patient negotiated 4 steps with BUEs on left rail sidestepping technique with min assistance.  PT demo verbal cues on technique including moving rollator walker to the top or bottom of steps prior to negotiating them.  Patient and her fianc verbalized understanding.    TREATMENT:                                                                                                                             DATE:  05/17/2023: Prosthetic Training with Transfemoral Prosthesis: Pt donned prosthesis and tightened suspension strap correctly. PT cued on need to be measured when requesting new liners to ensure they are proper size. PT reviewed higher fall risk when prosthesis is off. So recommend donning upon arising if not getting dressed yet.  Removing prosthesis for 1-2 hours during her midday (~2-3:00) then putting it back on limb for remainder of her day.   Pt verbalized understanding.   PT reviewed position of feet with stationary activities to facilitate prosthetic knee stability.  Pt performed throughout session with occasional reminders.  PT demo standing at counter with abdomen against counter.  She was able to wash & dry hands (no hand support) and reach into overhead counter handing water bottle to her fiance safely.   Pt amb 120' with rollator walker including neg around furniture with supervision.  Sit to/from stand rollator seat 3 reps with verbal cues only.  Pt neg 6.5" curb & 12* incline with rollator  walker with supervision / verbal cues.     HOME EXERCISE PROGRAM: Access Code: [email or web address] URL: [email or web address] Date: 04/12/2023 Prepared by: Lorie Rook   Exercises - Seated Table Hamstring Stretch  - 1-3 x daily - 7 x weekly - 1 sets - 2-3 reps - 20-30 seconds hold - Modified Thomas Stretch  - 2 x daily - 7 x weekly - 1 sets - 2-3 reps - 30 seconds hold - Hip Flexor Stretch at [employer] of Bed  - 2 x daily - 7 x weekly - 1 sets - 2-3 reps - 30 seconds hold - Standing  posture with back to counter  - 2 x daily - 7 x weekly - 1 sets - 1 reps - 5-10 minutes hold - Upright Stance at Door Frame Single Arm  - 3-6 x daily - 7 x weekly - 1 sets - 2 reps - 2 deep breathes hold - Upright Stance at Door Frame with Both Arms  - 3-6 x daily - 7 x weekly - 1 sets - 2 reps - 2 deep breathes hold [place]  Patient Portal      Do each exercise 1-2  times per day Do each exercise 5-10 repetitions Hold each exercise for 2 seconds to feel your location  AT SINK FIND YOUR MIDLINE POSITION AND PLACE FEET EQUAL DISTANCE FROM THE MIDLINE.  Try to find this position when standing still for activities.   USE TAPE ON FLOOR TO MARK THE MIDLINE POSITION which is even with middle of sink.  You also should try to feel with your limb pressure in socket.  You are trying to feel with limb what you used to feel with the bottom of your foot.  Side to Side Shift: Moving your hips only (not shoulders): move weight onto your left leg, HOLD/FEEL pressure in socket.  Move back to equal weight on each leg, HOLD/FEEL pressure in socket. Move weight onto your right leg, HOLD/FEEL pressure in socket. Move back to equal weight on each leg, HOLD/FEEL pressure in socket. Repeat.  Start with both hands on sink, progress to hand on prosthetic side only, then no hands.  Front to Back Shift: Moving your hips only (not shoulders): move your weight forward onto your toes, HOLD/FEEL pressure in socket. Move your weight  back to equal Flat Foot on both legs, HOLD/FEEL  pressure in socket. Move your weight back onto your heels, HOLD/FEEL  pressure in socket. Move your weight back to equal on both legs, HOLD/FEEL  pressure in socket. Repeat.  Start with both hands on sink, progress to hand on prosthetic side only, then no hands.  Moving Cones / Cups: With equal weight on each leg: Hold on with one hand the first time, then progress to no hand supports. Move cups from one side of sink to the other. Place cups ~2" out of your reach, progress to 10" beyond reach.  Place one hand in middle of sink and reach with other hand. Do both arms.  Then hover one hand and move cups with other hand.  Overhead/Upward Reaching: alternated reaching up to top cabinets or ceiling if no cabinets present. Keep equal weight on each leg. Start with one hand support on counter while other hand reaches and progress to no hand support with reaching.  ace one hand in middle of sink and reach with other hand. Do both arms.  Then hover one hand and move cups with other hand.  5.   Looking Over Shoulders: With equal weight on each leg: alternate turning to look over your shoulders with one hand support on counter as needed.  Start with head motions only to look in front of shoulder, then even with shoulder and progress to looking behind you. To look to side, move head /eyes, then shoulder on side looking pulls back, shift more weight to side looking and pull hip back. Place one hand in middle of sink and let go with other hand so your shoulder can pull back. Switch hands to look other way.   Then hover one hand and look over shoulder. If looking right,  use left hand at sink. If looking left, use right hand at sink. 6.  Stepping with leg that is not amputated:  Move items under cabinet out of your way. Shift your hips/pelvis so weight on prosthesis. Tighten muscles in hip on prosthetic side.  SLOWLY step other leg so front of foot is in cabinet. Then step back  to floor.    ASSESSMENT:  CLINICAL IMPRESSION: Patient appears to understand the scooting forward and backwards wheelchair to toilet transfer that was instructed today.  This should decrease her chance of falling during transfers when the prosthesis is off.  Patient has significantly improved her wear which is improved her mobility.  Patient appears to be safe ambulating with her rollator walker.  Patient will benefit from continued skilled physical therapy to address deficits and improve overall function of prosthesis.   OBJECTIVE IMPAIRMENTS: Abnormal gait, decreased activity tolerance, decreased balance, decreased endurance, decreased knowledge of condition, decreased knowledge of use of DME, decreased mobility, difficulty walking, decreased ROM, decreased strength, increased edema, impaired flexibility, postural dysfunction, prosthetic dependency , obesity, and pain.   ACTIVITY LIMITATIONS: carrying, lifting, bending, sitting, standing, stairs, transfers, and locomotion level  PARTICIPATION LIMITATIONS: meal prep, cleaning, and community activity  PERSONAL FACTORS: Fitness, Time since onset of injury/illness/exacerbation, and 3+ comorbidities: see PMH  are also affecting patient's functional outcome.   REHAB POTENTIAL: Good  CLINICAL DECISION MAKING: Evolving/moderate complexity  EVALUATION COMPLEXITY: Moderate   GOALS: Goals reviewed with patient? Yes  SHORT TERM GOALS: Target date: 05/20/2023  Patient reports no falls for 2 week time period.  Baseline: SEE OBJECTIVE DATA Goal status: MET 05/17/2023 2.  Patient reports tolerating prosthesis >12 hrs total /day without skin issues or limb pain after standing. Baseline: SEE OBJECTIVE DATA Goal status: Ongoing    05/19/2023  3.  Berg Balance >/= 32/56 Baseline: SEE OBJECTIVE DATA Goal status: Ongoing    05/19/2023  4. Patient ambulates 150' with RW & prosthesis with supervision / verbal cues only for gait deviations. Baseline: SEE  OBJECTIVE DATA Goal status: Ongoing   05/19/2023  5. Patient negotiates ramps & curbs with RW & prosthesis with supervision. Baseline: SEE OBJECTIVE DATA Goal status: MET  05/17/2023   UPDATED LONG TERM GOALS: Target date: 07/20/2023  Patient demonstrates & verbalized understanding of prosthetic care to enable safe utilization of prosthesis. Baseline: SEE OBJECTIVE DATA Goal status: Ongoing   05/26/2023  Patient tolerates prosthesis wear >90% of awake hours without skin or residual limb pain issues. Baseline: SEE OBJECTIVE DATA Goal status: Ongoing    05/26/2023  Berg Balance >36/56 to indicate lower fall risk Baseline: SEE OBJECTIVE DATA Goal status: Ongoing   05/26/2023  Patient ambulates >300' with prosthesis and LRAD modified independent. Baseline: SEE OBJECTIVE DATA Goal status: Ongoing    05/26/2023  Patient negotiates ramps, curbs & stairs with single rail with prosthesis  and LRAD modified independent. Baseline: SEE OBJECTIVE DATA Goal status: Ongoing  05/26/2023   Patient reports right knee pain </= 2/10 with standing and gait activities.   Baseline: SEE OBJECTIVE DATA Goal status:   Ongoing  05/26/2023  PLAN:  PT FREQUENCY:  2x/wk  PT DURATION: 90 days / 13 weeks  PLANNED INTERVENTIONS: 97164- PT Re-evaluation, 97110-Therapeutic exercises, 97530- Therapeutic activity, 97112- Neuromuscular re-education, [phone]- Self Care, 19147- Gait training, [phone]- Prosthetic training, Patient/Family education, Balance training, Stair training, DME instructions, and physical performance testing  PLAN FOR NEXT SESSION: Request additional visits from [facility] Medicare, continue with frequency 1 time per week, check  how toilet transfers are doing based on instruction given today.  Set up HEP to work on balance activities.  High repetition of education for improved retention.   Lorie Rook, PT, DPT 05/26/2023, 4:31 PM   Date of referral: 12/22/2022 Referring provider: Gearldean Keepers,  MD Referring diagnosis? Z61.096 (ICD-10-CM) - Hx of AKA (above knee amputation), left  Treatment diagnosis? (if different than referring diagnosis)  Other abnormalities of gait and mobility  ICD-10-CM: R26.89    Unsteadiness on feet  ICD-10-CM: R26.81    Muscle weakness (generalized)  ICD-10-CM: M62.81    Chronic pain of right knee  M25.561 & G89.29   History of falling Z91.81  Abnormal Posture  R29.3   What was this (referring dx) caused by? Surgery (Type: AKA)   Nature of Condition: Initial Onset (within last 3 months) for prosthesis delivery              Laterality: Lt   Current Functional Measure Score: Other 05/12/2023 & 04/21/2023 Randye Buttner Balance 28/56  1/21/2025Randye Buttner Balance 22/56  at eval Berg was 15/56;     Objective measurements identify impairments when they are compared to normal values, the uninvolved extremity, and prior level of function.             [x]  Yes             []  No   Objective assessment of functional ability: Moderate functional limitations              Briefly describe symptoms: patient requires lots of repetition for education and safety with use of her prosthesis.  Patient has had repetitive falls but these have decreased with PT educating patient.  PT began educating patient on use of rollator walker as this transition should improve her ability for community mobility allowing rest and transporting items.  Patient requires more education to safely utilize a rollator walker.   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     Average pain intensity:             Last 24 hours: 1-5/10 (higher due to recent fall with contusion)             Past week: 1-5/10   How often does the pt experience symptoms? Constantly   How much have the symptoms interfered with usual daily activities? Extremely   How has condition changed since care began at this facility? Better   In general, how is the patients overall health? Very Good      BACK PAIN (STarT Back Screening Tool) No

## 2023-05-31 ENCOUNTER — Encounter: Admitting: Physical Therapy

## 2023-06-02 ENCOUNTER — Encounter: Admitting: Physical Therapy

## 2023-06-07 ENCOUNTER — Encounter: Admitting: Physical Therapy

## 2023-06-09 ENCOUNTER — Encounter: Payer: Self-pay | Admitting: Physical Therapy

## 2023-06-09 ENCOUNTER — Ambulatory Visit (INDEPENDENT_AMBULATORY_CARE_PROVIDER_SITE_OTHER): Admitting: Physical Therapy

## 2023-06-09 DIAGNOSIS — G8929 Other chronic pain: Secondary | ICD-10-CM

## 2023-06-09 DIAGNOSIS — R2681 Unsteadiness on feet: Secondary | ICD-10-CM

## 2023-06-09 DIAGNOSIS — R2689 Other abnormalities of gait and mobility: Secondary | ICD-10-CM

## 2023-06-09 DIAGNOSIS — M25561 Pain in right knee: Secondary | ICD-10-CM

## 2023-06-09 DIAGNOSIS — M6281 Muscle weakness (generalized): Secondary | ICD-10-CM

## 2023-06-09 DIAGNOSIS — Z9181 History of falling: Secondary | ICD-10-CM | POA: Diagnosis not present

## 2023-06-09 DIAGNOSIS — R293 Abnormal posture: Secondary | ICD-10-CM

## 2023-06-09 NOTE — Therapy (Signed)
 OUTPATIENT PHYSICAL THERAPY PROSTHETIC TREATMENT   Patient Name: Tammy [name] MRN: [id number] DOB:1955/09/26, 68 y.o., female Today's Date: 06/09/2023  END OF SESSION:  PT End of Session - 06/09/23 1519     Visit Number 25    Number of Visits 44    Date for PT Re-Evaluation 07/20/23    Authorization Type [employer] Medicare    Authorization Time Period [facility] MEDICARE APPROVED 4 PT VISITS 05/12/23-06/09/2023    Authorization - Visit Number 4    Authorization - Number of Visits 4    Progress Note Due on Visit 27    PT Start Time 1515    PT Stop Time 1540    PT Time Calculation (min) 25 min    Equipment Utilized During Treatment Gait belt    Activity Tolerance Patient tolerated treatment well    Behavior During Therapy WFL for tasks assessed/performed               Past Medical History:  Diagnosis Date   Anemia    hx of   Anxiety    on meds   Arthritis    back/bilateral ankles   Bipolar disorder (HCC)    Breast cancer (HCC) 2022   LEFT breast-radiation   Depression    on meds   Family history of breast cancer 07/10/2020   Family history of colon cancer 07/10/2020   Family history of ovarian cancer 07/10/2020   GERD (gastroesophageal reflux disease)    on meds   Heart murmur    dx by PCP   Hepatitis C    HTN (hypertension)    on meds   Hx of radiation therapy    Liver cirrhosis (HCC)    from hep c   Neuromuscular disorder (HCC)    Osteopenia    Personal history of radiation therapy    Stroke (HCC) 09/2012   slurred speech and LEFT sided weakness/notes 10/04/2012   Past Surgical History:  Procedure Laterality Date   AMPUTATION Left 11/04/2022   Procedure: LEFT ABOVE KNEE AMPUTATION;  Surgeon: Timothy Ford, MD;  Location: [facility] OR;  Service: Orthopedics;  Laterality: Left;   ANKLE FRACTURE SURGERY Left 2018   APPLICATION OF WOUND VAC Left 06/15/2022   Procedure: APPLICATION OF WOUND VAC;  Surgeon: Thornell Flirt, DO;  Location: [facility];  Service:  Plastics;  Laterality: Left;   APPLICATION OF WOUND VAC Left 09/14/2022   Procedure: APPLICATION OF WOUND VAC;  Surgeon: Thornell Flirt, DO;  Location: [facility] OR;  Service: Plastics;  Laterality: Left;   BREAST LUMPECTOMY Left 07/2020   BREAST LUMPECTOMY WITH RADIOACTIVE SEED AND SENTINEL LYMPH NODE BIOPSY Left 07/26/2020   Procedure: LEFT BREAST LUMPECTOMY WITH RADIOACTIVE SEED AND SENTINEL LYMPH NODE BIOPSY;  Surgeon: Oza Blumenthal, MD;  Location: [facility];  Service: General;  Laterality: Left;   CESAREAN SECTION  1986   COLONOSCOPY     DIAGNOSTIC LAPAROSCOPY  1985   ESOPHAGOGASTRODUODENOSCOPY N/A 02/21/2023   Procedure: ESOPHAGOGASTRODUODENOSCOPY (EGD);  Surgeon: Tobin Forts, MD;  Location: [facility];  Service: Gastroenterology;  Laterality: N/A;   HARDWARE REMOVAL Left 04/30/2022   Procedure: HARDWARE REMOVAL LEFT FEMUR;  Surgeon: Adonica Hoose, MD;  Location: [facility];  Service: Orthopedics;  Laterality: Left;  150   HARDWARE REMOVAL Left 11/04/2022   Procedure: HARDWARE REMOVAL INFECTED LEFT TOTAL KNEE ARTHROPLASTY;  Surgeon: Timothy Ford, MD;  Location: [facility] OR;  Service: Orthopedics;  Laterality: Left;   I & D KNEE WITH  POLY EXCHANGE Left 06/07/2022   Procedure: IRRIGATION AND DEBRIDEMENT KNEE WITH POLY EXCHANGE;  Surgeon: Adonica Hoose, MD;  Location: [facility];  Service: Orthopedics;  Laterality: Left;   INCISION AND DRAINAGE OF WOUND Left 06/04/2022   Procedure: IRRIGATION AND DEBRIDEMENT KNEE;  Surgeon: Adonica Hoose, MD;  Location: [facility];  Service: Orthopedics;  Laterality: Left;  60   INCISION AND DRAINAGE OF WOUND Left 06/15/2022   Procedure: excision of knee wound with Myriad;  Surgeon: Thornell Flirt, DO;  Location: [facility];  Service: Plastics;  Laterality: Left;   ORIF FEMUR FRACTURE Left 04/10/2021   Procedure: OPEN REDUCTION INTERNAL FIXATION (ORIF) DISTAL FEMUR FRACTURE;  Surgeon: Hardy Lia, MD;  Location: [facility] OR;  Service: Orthopedics;   Laterality: Left;   PARTIAL KNEE ARTHROPLASTY Left 04/30/2022   Procedure: DISTAL FEMUR REPLACEMENT;  Surgeon: Adonica Hoose, MD;  Location: [facility];  Service: Orthopedics;  Laterality: Left;  150   Patient Active Problem List   Diagnosis Date Noted   Left femoral vein DVT (HCC) 02/20/2023   GERD (gastroesophageal reflux disease) 02/20/2023   Acute upper gastrointestinal bleeding 02/19/2023   Bipolar 1 disorder, depressed, partial remission (HCC) 11/18/2022   Left above-knee amputee (HCC) 11/13/2022   Infection of total knee replacement (HCC) 11/04/2022   S/P AKA (above knee amputation) unilateral, left (HCC) 11/04/2022   Chronic hepatitis C with cirrhosis (HCC) 06/06/2022   Surgical wound dehiscence, initial encounter 06/04/2022   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, left, with nonunion, subsequent encounter 04/30/2022   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 04/30/2022   Hypokalemia 04/16/2021   Hip fracture requiring operative repair (HCC) 04/16/2021   Vitamin D  deficiency 04/12/2021   Hyponatremia 04/11/2021   Acute postoperative anemia due to expected blood loss 04/11/2021   Class 1 obesity 04/11/2021   Left comminuted and displaced fracture of the distal femur metaphysis 04/08/2021   History of CVA (cerebrovascular accident) 04/08/2021   Genetic testing 07/17/2020   Family history of breast cancer 07/10/2020   Family history of ovarian cancer 07/10/2020   Family history of colon cancer 07/10/2020   Malignant neoplasm of upper-outer quadrant of left breast in female, estrogen receptor positive (HCC) 07/09/2020   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   Chronic pain syndrome 10/06/2012   Nicotine  dependence 10/06/2012   Anxiety state 10/05/2012   Bipolar disorder, unspecified (HCC) 10/05/2012   Slurred speech 10/04/2012   Left-sided weakness 10/04/2012   Hyperkalemia 10/04/2012   Acute CVA  (cerebrovascular accident) (HCC) 10/04/2012   Essential hypertension     PCP: Wright Heal, MD  REFERRING PROVIDER: Gearldean Keepers, MD  ONSET DATE: 01/25/2023 prosthesis delivery  REFERRING DIAG: R60.454 (ICD-10-CM) - Hx of AKA (above knee amputation), left   THERAPY DIAG:  Other abnormalities of gait and mobility  Unsteadiness on feet  History of falling  Muscle weakness (generalized)  Chronic pain of right knee  Abnormal posture  Rationale for Evaluation and Treatment: Rehabilitation  SUBJECTIVE:   SUBJECTIVE STATEMENT: She had some GI problems last week and it stopped on Saturday.  She had coffee ground emesis again but did not want to go to hospital.  Pt reports none in last 4 days.  Her w/c will not fit in front of toilet and the raised toilet seat is in the way for transfer discussed last time.  Pt accompanied by: Royston Cornea, significant other  PERTINENT HISTORY: AKA, anemia, anxiety, arthritis, Bipolar disorder, Breast CA left with radiation, HTN, Hep  C, liver cirrhosis, Neuromuscular disorder, osteopenia, stroke Left hemiparesis 2014  PAIN:  Are you having pain? No pain Pain location: residual limb Pain description: soreness Aggravating factors: standing or walking too much, or twisting motion Relieving factors: resting  PRECAUTIONS: Fall  WEIGHT BEARING RESTRICTIONS: No  FALLS: Has patient fallen in last 6 months? Yes. Number of falls 1 no injuries  LIVING ENVIRONMENT: Lives with: lives with their spouse Lives in: Mobile home Home Access: Ramped entrance or 4 steps with double rail to [employer]OR layout: One level Has following equipment at home: Single point cane, Environmental consultant - 2 wheeled, Wheelchair (manual), Shower bench, bed side commode, Grab bars, and Ramped entry  OCCUPATION: retired from nursing  PLOF: Independent prior to Fracture 04/10/2021.  Since fracture was able to get to cane for community until hardware broke.    PATIENT GOALS:  walk independently in  community, travel, drive, take care of 4yo great granddaughter  OBJECTIVE:  COGNITION: Evaluation on 01/25/2023:  Overall cognitive status: Within functional limits for tasks assessed  POSTURE:  04/21/2023:  standing with rounded shoulders, forward head, flexed trunk , and weight shift right  Evaluation on 01/25/2023: rounded shoulders, forward head, flexed trunk , and weight shift right  LOWER EXTREMITY ROM:  ROM P:passive  A:active Right eval Left eval  Hip flexion    Hip extension  12deg  Hip abduction    Hip adduction    Hip internal rotation    Hip external rotation    Knee flexion    Knee extension    Ankle dorsiflexion    Ankle plantarflexion    Ankle inversion    Ankle eversion     (Blank rows = not tested)  LOWER EXTREMITY MMT:  MMT Right eval Left eval  Hip flexion    Hip extension    Hip abduction    Hip adduction    Hip internal rotation    Hip external rotation    Knee flexion    Knee extension    Ankle dorsiflexion    Ankle plantarflexion    Ankle inversion    Ankle eversion    At Evaluation all strength testing is grossly seated and functionally standing / gait. (Blank rows = not tested)  TRANSFERS: 04/21/2023:  Sit to stand: SBA w/c to RW requires without armrests pushing off w/c seat and stabilization without UE support but RW close for safety.   Stand to sit: SBA RW to position feet, then able to sit down using armrests on w/c to control descent.  Evaluation on 01/25/2023:  Sit to stand: SBA w/c to RW requires armrests on w/c and stabilization with RW.   Stand to sit: SBA RW to w/c requires armrests on w/c and stabilization with RW.    FUNCTIONAL TESTs:  05/12/2023: Randye Buttner balance 28/56  04/21/2023: TUG with RW: 58.28sec Berg balance: 28/56   03/02/2023: Randye Buttner Balance 22/56  Eval: Berg Balance Scale: 15/56   GAIT: 05/10/2023:    Max tolerable distance: Patient ambulated 155' with rolling walker with close supervision.  04/20/2023:  gait velocity with RW: 0.51 ft/sec Pt able to amb with RW & Transfemoral prosthesis with supervision / requiring verbal cues for gait deviations.    03/18/2023: -Patient ambulated 135ft and performing a 180deg turn CGA for patient safety with intermittent SBA  01/26/2023: Patient ambulated 10 feet with rolling walker with minA but required constant cueing for technique and safety.  PALPATION: 03/18/2023: patient verbalized seeing areas of discoloration on residual limb a few  days prior. Therapist palpation and examination of area, did not see any noticeable areas with patient stating that the areas must have gone away without issue.    CURRENT PROSTHETIC WEAR ASSESSMENT: 04/21/2023:  Patient is independent with: skin check, residual limb care, care of non-amputated limb, prosthetic cleaning, ply sock cleaning, correct ply sock adjustment, proper wear schedule/adjustment, and proper weight-bearing schedule/adjustment Donning prosthesis: modified independent Doffing prosthesis: modified independent Prosthetic wear tolerance: tolerated wear of prosthesis daily 6-7 hours but has decreased over last few days due to limb pain with recent fall.  Prosthetic weight bearing: pt tolerated standing 10 min for Berg Balance with partial weight on prosthesis.   Evaluation on 01/25/2023:  Patient is dependent with: skin check, residual limb care, care of non-amputated limb, prosthetic cleaning, ply sock cleaning, correct ply sock adjustment, proper wear schedule/adjustment, and proper weight-bearing schedule/adjustment Donning prosthesis: Min A Doffing prosthesis: SBA Prosthetic wear tolerance: tolerated wear 1 hour prior to PT evaluation.  Prosthetic weight bearing tolerance: 5 minutes with BUE support on RW with limited wear on prosthesis.  Edema: pitting Prosthetic description: Ischial Containment socket with flexible inner socket, silicon liner with velcro strap suspension.    TODAY'S  TREATMENT:                                                                                                                              DATE:  06/09/2023: Therapeutic Activities: PT strongly recommended going to ED if she has any more coffee ground emesis as is indication of serious issue.  Pt & fianc verbalized understanding. Patient reports that she has 2 bathrooms in her home.  1 bathroom is off the master bedroom and the second 1 is in the hall.  Patient reported having difficulty positioning the wheelchair at the head of the toilet for the scooting transfer discussed last session.  PT demo with similar set up of a bathroom (toilet on left side) and demonstrated a side scooting transfer facing backwards on the toilet when she is seated on the toilet.  Patient able to return demonstration using grab bar that she indicates she has at the home.  Patient reports this did not bother her knee nor did she feel like she was anywhere at risk of falling.  PT also did not note any risk for a fall with this type transfer.  Patient's fianc is to attempt using rollator walker in the second bathroom of the home with the patient observing.  They indicated that they would have to remove the door in order to use the wheelchair in that bathroom but feel like the rollator may fit in there or the standard walker.  If either walker with it into the second bathroom PT is recommending to switch the raised toilet seat to that bathroom and use it during the day when she is wearing the prosthesis.  Then she can use the primary bathroom of the master bedroom when she needs to transfer without  the prosthesis.  All recent falls have been when patient was trying to transfer with the prosthesis off early in the morning or late at night.  PT is working on this set up and functional activity to decrease patient's risk of fall and potential injury.  Patient verbalized understanding and will attempt what was discussed today at home. Patient reports she was too  fatigued to continue further with the PT session.  PT and patient read on prosthesis including tightening suspension strap.  Session ended early due to patient being fatigued.    TREATMENT:                                                                                                                             DATE:  05/26/2023: Prosthetic Training with Transfemoral Prosthesis: PT assessed patient's new rollator walker that she just purchased.  The walker is set for the proper height and the brakes appeared to be working correctly. Patient ambulated 100' with rollator walker safely including turning to sit on the seat for rest. Patient negotiated 12*ramp and 6.5" curb with rollator walker safely. PT reviewed walking program to improve her activity tolerance with short, medium and long walks.  Patient verbalized understanding  Therapeutic activities: Patient reports fall this morning was more controlled with no bruising or injury.  All falls recently appear to be when the prosthesis wears off. PT demo and verbal cues on a scoot forward transfer from wheelchair to toilet where she would be facing backwards on the toilet.  When she finished using the bathroom then she could scoot backwards directly onto the wheelchair.  This would decrease her chance of falling during the transfer without the prosthesis first thing in the morning.  Patient and fianc verbalized understanding.  Both report they feel like this will decrease her chance of falling significantly.    TREATMENT:                                                                                                                             DATE:  05/19/2023: Prosthetic Training with Transfemoral Prosthesis: Patient able to stand without UE support with locked rollator walker close for safety and manage her pants safely. Patient ambulated 120' & 45' with a rollator walker safely.  She had 1 episode of decreased prosthetic knee control that PT  had to provide min a to prevent a fall. Patient able to perform sit to  from stand rollator seat safely with supervision and light verbal cues. Patient negotiated 6.5" curb with locked rollator walker with contact-guard assist and verbal cues. Patient associated 12*inclined with locked rollator walker with contact-guard assist and verbal cues. PT demo picking up an object off the floor keeping prosthetic knee extended so it can aid with balance.  Patient places left upper extremity on rollator seat and reaches to the floor with right upper extremity with prosthetic leg in an abducted position.  Patient performed 2 reps with supervision. Patient negotiated 4 steps with BUEs on left rail sidestepping technique with min assistance.  PT demo verbal cues on technique including moving rollator walker to the top or bottom of steps prior to negotiating them.  Patient and her fianc verbalized understanding.    HOME EXERCISE PROGRAM: Access Code: [email or web address] URL: [email or web address] Date: 04/12/2023 Prepared by: Lorie Rook   Exercises - Seated Table Hamstring Stretch  - 1-3 x daily - 7 x weekly - 1 sets - 2-3 reps - 20-30 seconds hold - Modified Thomas Stretch  - 2 x daily - 7 x weekly - 1 sets - 2-3 reps - 30 seconds hold - Hip Flexor Stretch at [place] of Bed  - 2 x daily - 7 x weekly - 1 sets - 2-3 reps - 30 seconds hold - Standing posture with back to counter  - 2 x daily - 7 x weekly - 1 sets - 1 reps - 5-10 minutes hold - Upright Stance at Door Frame Single Arm  - 3-6 x daily - 7 x weekly - 1 sets - 2 reps - 2 deep breathes hold - Upright Stance at Door Frame with Both Arms  - 3-6 x daily - 7 x weekly - 1 sets - 2 reps - 2 deep breathes hold [place]  Patient Portal      Do each exercise 1-2  times per day Do each exercise 5-10 repetitions Hold each exercise for 2 seconds to feel your location  AT SINK FIND YOUR MIDLINE POSITION AND PLACE FEET EQUAL DISTANCE FROM THE  MIDLINE.  Try to find this position when standing still for activities.   USE TAPE ON FLOOR TO MARK THE MIDLINE POSITION which is even with middle of sink.  You also should try to feel with your limb pressure in socket.  You are trying to feel with limb what you used to feel with the bottom of your foot.  Side to Side Shift: Moving your hips only (not shoulders): move weight onto your left leg, HOLD/FEEL pressure in socket.  Move back to equal weight on each leg, HOLD/FEEL pressure in socket. Move weight onto your right leg, HOLD/FEEL pressure in socket. Move back to equal weight on each leg, HOLD/FEEL pressure in socket. Repeat.  Start with both hands on sink, progress to hand on prosthetic side only, then no hands.  Front to Back Shift: Moving your hips only (not shoulders): move your weight forward onto your toes, HOLD/FEEL pressure in socket. Move your weight back to equal Flat Foot on both legs, HOLD/FEEL  pressure in socket. Move your weight back onto your heels, HOLD/FEEL  pressure in socket. Move your weight back to equal on both legs, HOLD/FEEL  pressure in socket. Repeat.  Start with both hands on sink, progress to hand on prosthetic side only, then no hands.  Moving Cones / Cups: With equal weight on each leg: Hold on with one hand the first time, then progress to no hand supports.  Move cups from one side of sink to the other. Place cups ~2" out of your reach, progress to 10" beyond reach.  Place one hand in middle of sink and reach with other hand. Do both arms.  Then hover one hand and move cups with other hand.  Overhead/Upward Reaching: alternated reaching up to top cabinets or ceiling if no cabinets present. Keep equal weight on each leg. Start with one hand support on counter while other hand reaches and progress to no hand support with reaching.  ace one hand in middle of sink and reach with other hand. Do both arms.  Then hover one hand and move cups with other hand.  5.   Looking Over  Shoulders: With equal weight on each leg: alternate turning to look over your shoulders with one hand support on counter as needed.  Start with head motions only to look in front of shoulder, then even with shoulder and progress to looking behind you. To look to side, move head /eyes, then shoulder on side looking pulls back, shift more weight to side looking and pull hip back. Place one hand in middle of sink and let go with other hand so your shoulder can pull back. Switch hands to look other way.   Then hover one hand and look over shoulder. If looking right, use left hand at sink. If looking left, use right hand at sink. 6.  Stepping with leg that is not amputated:  Move items under cabinet out of your way. Shift your hips/pelvis so weight on prosthesis. Tighten muscles in hip on prosthetic side.  SLOWLY step other leg so front of foot is in cabinet. Then step back to floor.    ASSESSMENT:  CLINICAL IMPRESSION: PT addressed transfers without the prosthesis for early morning and late evening that have been resulting in falls.  Patient appears to have a understanding of the modification based on patient feedback.  Patient continues to benefit from skilled PT and needs additional PT to improve overall function and safety with the prosthesis.    OBJECTIVE IMPAIRMENTS: Abnormal gait, decreased activity tolerance, decreased balance, decreased endurance, decreased knowledge of condition, decreased knowledge of use of DME, decreased mobility, difficulty walking, decreased ROM, decreased strength, increased edema, impaired flexibility, postural dysfunction, prosthetic dependency , obesity, and pain.   ACTIVITY LIMITATIONS: carrying, lifting, bending, sitting, standing, stairs, transfers, and locomotion level  PARTICIPATION LIMITATIONS: meal prep, cleaning, and community activity  PERSONAL FACTORS: Fitness, Time since onset of injury/illness/exacerbation, and 3+ comorbidities: see PMH  are also affecting  patient's functional outcome.   REHAB POTENTIAL: Good  CLINICAL DECISION MAKING: Evolving/moderate complexity  EVALUATION COMPLEXITY: Moderate   GOALS: Goals reviewed with patient? Yes  SHORT TERM GOALS: Target date: 05/20/2023  Patient reports no falls for 2 week time period.  Baseline: SEE OBJECTIVE DATA Goal status: MET 05/17/2023 2.  Patient reports tolerating prosthesis >12 hrs total /day without skin issues or limb pain after standing. Baseline: SEE OBJECTIVE DATA Goal status: Ongoing    05/19/2023  3.  Berg Balance >/= 32/56 Baseline: SEE OBJECTIVE DATA Goal status: Ongoing    05/19/2023  4. Patient ambulates 150' with RW & prosthesis with supervision / verbal cues only for gait deviations. Baseline: SEE OBJECTIVE DATA Goal status: Ongoing   05/19/2023  5. Patient negotiates ramps & curbs with RW & prosthesis with supervision. Baseline: SEE OBJECTIVE DATA Goal status: MET  05/17/2023   UPDATED LONG TERM GOALS: Target date: 07/20/2023  Patient demonstrates & verbalized  understanding of prosthetic care to enable safe utilization of prosthesis. Baseline: SEE OBJECTIVE DATA Goal status: Ongoing   06/09/2023  Patient tolerates prosthesis wear >90% of awake hours without skin or residual limb pain issues. Baseline: SEE OBJECTIVE DATA Goal status: Ongoing    06/09/2023  Berg Balance >36/56 to indicate lower fall risk Baseline: SEE OBJECTIVE DATA Goal status: Ongoing    06/09/2023  Patient ambulates >300' with prosthesis and LRAD modified independent. Baseline: SEE OBJECTIVE DATA Goal status: Ongoing   06/09/2023  Patient negotiates ramps, curbs & stairs with single rail with prosthesis  and LRAD modified independent. Baseline: SEE OBJECTIVE DATA Goal status: Ongoing   06/09/2023   Patient reports right knee pain </= 2/10 with standing and gait activities.   Baseline: SEE OBJECTIVE DATA Goal status:   Ongoing   06/09/2023  PLAN:  PT FREQUENCY:  2x/wk  PT DURATION: 90 days  / 13 weeks  PLANNED INTERVENTIONS: 97164- PT Re-evaluation, 97110-Therapeutic exercises, 97530- Therapeutic activity, 97112- Neuromuscular re-education, [phone]- Self Care, 84696- Gait training, [phone]- Prosthetic training, Patient/Family education, Balance training, Stair training, DME instructions, and physical performance testing  PLAN FOR NEXT SESSION: continue with frequency 1 time per week, check how modifications for toilet transfers are doing based on instruction given today.  Set up HEP to work on balance activities.  High repetition of education for improved retention.   Lorie Rook, PT, DPT 06/09/2023, 3:53 PM   Date of referral: 12/22/2022 Referring provider: Gearldean Keepers, MD Referring diagnosis? U13.244 (ICD-10-CM) - Hx of AKA (above knee amputation), left  Treatment diagnosis? (if different than referring diagnosis)  Other abnormalities of gait and mobility  ICD-10-CM: R26.89    Unsteadiness on feet  ICD-10-CM: R26.81    Muscle weakness (generalized)  ICD-10-CM: M62.81    Chronic pain of right knee  M25.561 & G89.29   History of falling Z91.81  Abnormal Posture  R29.3   What was this (referring dx) caused by? Surgery (Type: AKA)   Nature of Condition: Initial Onset (within last 3 months) for prosthesis delivery              Laterality: Lt   Current Functional Measure Score: Other 05/12/2023 & 04/21/2023 Randye Buttner Balance 28/56  1/21/2025Randye Buttner Balance 22/56  at eval [facility] was 15/56;     Objective measurements identify impairments when they are compared to normal values, the uninvolved extremity, and prior level of function.             [x]  Yes             []  No   Objective assessment of functional ability: Moderate functional limitations              Briefly describe symptoms: patient requires lots of repetition for education and safety with use of her prosthesis.  Patient has had repetitive falls but these have decreased with PT educating patient.  All of most recent falls  have been early in the morning or late in the evening when the prosthesis has been removed.  PT has been addressing those transfers which patient has improved.  Patient has improved her function with a rollator walker but continues to need instruction for prosthetic control and progression of activities.   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     Average pain intensity:             Last 24 hours: 0/10  Past week: 1/10   How often does the pt experience symptoms? Constantly   How much have the symptoms interfered with usual daily activities? Extremely   How has condition changed since care began at this facility? Better   In general, how is the patients overall health? Very Good     BACK PAIN (STarT Back Screening Tool) No

## 2023-06-11 ENCOUNTER — Observation Stay (HOSPITAL_COMMUNITY)
Admission: EM | Admit: 2023-06-11 | Discharge: 2023-06-12 | Disposition: A | Discharge status: Home / Self Care | Attending: Internal Medicine | Admitting: Internal Medicine

## 2023-06-11 ENCOUNTER — Other Ambulatory Visit: Payer: Self-pay

## 2023-06-11 ENCOUNTER — Encounter (HOSPITAL_COMMUNITY): Payer: Self-pay | Admitting: Emergency Medicine

## 2023-06-11 DIAGNOSIS — Z87891 Personal history of nicotine dependence: Secondary | ICD-10-CM | POA: Insufficient documentation

## 2023-06-11 DIAGNOSIS — K219 Gastro-esophageal reflux disease without esophagitis: Secondary | ICD-10-CM | POA: Diagnosis not present

## 2023-06-11 DIAGNOSIS — Z8673 Personal history of transient ischemic attack (TIA), and cerebral infarction without residual deficits: Secondary | ICD-10-CM | POA: Insufficient documentation

## 2023-06-11 DIAGNOSIS — D62 Acute posthemorrhagic anemia: Secondary | ICD-10-CM | POA: Diagnosis present

## 2023-06-11 DIAGNOSIS — K921 Melena: Principal | ICD-10-CM | POA: Diagnosis present

## 2023-06-11 DIAGNOSIS — E876 Hypokalemia: Secondary | ICD-10-CM | POA: Diagnosis not present

## 2023-06-11 DIAGNOSIS — K922 Gastrointestinal hemorrhage, unspecified: Secondary | ICD-10-CM | POA: Diagnosis not present

## 2023-06-11 DIAGNOSIS — K920 Hematemesis: Secondary | ICD-10-CM | POA: Diagnosis present

## 2023-06-11 DIAGNOSIS — B182 Chronic viral hepatitis C: Secondary | ICD-10-CM | POA: Diagnosis not present

## 2023-06-11 DIAGNOSIS — Z79899 Other long term (current) drug therapy: Secondary | ICD-10-CM | POA: Diagnosis not present

## 2023-06-11 DIAGNOSIS — Z89612 Acquired absence of left leg above knee: Secondary | ICD-10-CM | POA: Diagnosis not present

## 2023-06-11 DIAGNOSIS — I10 Essential (primary) hypertension: Secondary | ICD-10-CM | POA: Diagnosis not present

## 2023-06-11 DIAGNOSIS — G894 Chronic pain syndrome: Secondary | ICD-10-CM | POA: Diagnosis present

## 2023-06-11 LAB — TYPE AND SCREEN
ABO/RH(D): AB POS
Antibody Screen: NEGATIVE

## 2023-06-11 LAB — COMPREHENSIVE METABOLIC PANEL WITH GFR
ALT: 16 U/L (ref 0–44)
AST: 18 U/L (ref 15–41)
Albumin: 4.2 g/dL (ref 3.5–5.0)
Alkaline Phosphatase: 78 U/L (ref 38–126)
Anion gap: 11 (ref 5–15)
BUN: 10 mg/dL (ref 8–23)
CO2: 28 mmol/L (ref 22–32)
Calcium: 11.7 mg/dL — ABNORMAL HIGH (ref 8.9–10.3)
Chloride: 97 mmol/L — ABNORMAL LOW (ref 98–111)
Creatinine, Ser: 0.67 mg/dL (ref 0.44–1.00)
GFR, Estimated: >60 mL/min (ref >60)
Glucose, Bld: 115 mg/dL — ABNORMAL HIGH (ref 70–99)
Potassium: 3.1 mmol/L — ABNORMAL LOW (ref 3.5–5.1)
Sodium: 136 mmol/L (ref 135–145)
Total Bilirubin: 0.2 mg/dL (ref 0.0–1.2)
Total Protein: 8 g/dL (ref 6.5–8.1)

## 2023-06-11 LAB — HEMOGLOBIN AND HEMATOCRIT, BLOOD
HCT: 36.1 % (ref 36.0–46.0)
HCT: 32.6 % — ABNORMAL LOW (ref 36.0–46.0)
Hemoglobin: 11.4 g/dL — ABNORMAL LOW (ref 12.0–15.0)
Hemoglobin: 10.2 g/dL — ABNORMAL LOW (ref 12.0–15.0)

## 2023-06-11 LAB — PROTIME-INR
INR: 1 (ref 0.8–1.2)
Prothrombin Time: 13.8 s (ref 11.4–15.2)

## 2023-06-11 LAB — CBC
HCT: 37.4 % (ref 36.0–46.0)
Hemoglobin: 11.9 g/dL — ABNORMAL LOW (ref 12.0–15.0)
MCH: 27.9 pg (ref 26.0–34.0)
MCHC: 31.8 g/dL (ref 30.0–36.0)
MCV: 87.8 fL (ref 80.0–100.0)
Platelets: 308 10*3/uL (ref 150–400)
RBC: 4.26 MIL/uL (ref 3.87–5.11)
RDW: 15.1 % (ref 11.5–15.5)
WBC: 5 10*3/uL (ref 4.0–10.5)
nRBC: 0 % (ref 0.0–0.2)

## 2023-06-11 LAB — POC OCCULT BLOOD, ED: Fecal Occult Bld: POSITIVE — AB

## 2023-06-11 LAB — MAGNESIUM: Magnesium: 1.7 mg/dL (ref 1.7–2.4)

## 2023-06-11 LAB — PHOSPHORUS: Phosphorus: 3.1 mg/dL (ref 2.5–4.6)

## 2023-06-11 LAB — LIPASE, BLOOD: Lipase: 26 U/L (ref 11–51)

## 2023-06-11 MED ORDER — HYDROMORPHONE HCL 1 MG/ML IJ SOLN
1.0000 mg | INTRAMUSCULAR | Status: DC | PRN
  Administered 2023-06-11: 1 mg via INTRAVENOUS
  Filled 2023-06-11: qty 1

## 2023-06-11 MED ORDER — ONDANSETRON HCL 4 MG PO TABS
4.0000 mg | ORAL_TABLET | Freq: Four times a day (QID) | ORAL | Status: DC | PRN
Start: 2023-06-11 — End: 2023-06-12

## 2023-06-11 MED ORDER — ONDANSETRON HCL 4 MG/2ML IJ SOLN
4.0000 mg | Freq: Four times a day (QID) | INTRAMUSCULAR | Status: DC | PRN
Start: 2023-06-11 — End: 2023-06-12

## 2023-06-11 MED ORDER — PANTOPRAZOLE SODIUM 40 MG IV SOLR
40.0000 mg | Freq: Once | INTRAVENOUS | Status: DC
  Filled 2023-06-11: qty 10

## 2023-06-11 MED ORDER — PANTOPRAZOLE SODIUM 40 MG IV SOLR
40.0000 mg | Freq: Three times a day (TID) | INTRAVENOUS | Status: DC
  Administered 2023-06-11 – 2023-06-12 (×3): 40 mg via INTRAVENOUS
  Filled 2023-06-11 (×3): qty 10

## 2023-06-11 MED ORDER — POTASSIUM CHLORIDE 10 MEQ/100ML IV SOLN
10.0000 meq | INTRAVENOUS | Status: AC
  Administered 2023-06-11 (×3): 10 meq via INTRAVENOUS
  Filled 2023-06-11 (×4): qty 100

## 2023-06-11 MED ORDER — ACETAMINOPHEN 500 MG PO TABS
500.0000 mg | ORAL_TABLET | Freq: Four times a day (QID) | ORAL | Status: DC | PRN
  Administered 2023-06-11: 500 mg via ORAL
  Filled 2023-06-11: qty 1

## 2023-06-11 MED ORDER — PANTOPRAZOLE SODIUM 40 MG IV SOLR: 40.0000 mg | Freq: Two times a day (BID) | INTRAVENOUS | Status: DC

## 2023-06-11 MED ORDER — MAGNESIUM SULFATE 2 GM/50ML IV SOLN
2.0000 g | Freq: Once | INTRAVENOUS | Status: AC
  Administered 2023-06-11: 2 g via INTRAVENOUS
  Filled 2023-06-11: qty 50

## 2023-06-11 MED ORDER — POTASSIUM CHLORIDE 10 MEQ/100ML IV SOLN
10.0000 meq | Freq: Once | INTRAVENOUS | Status: AC
  Administered 2023-06-11: 10 meq via INTRAVENOUS
  Filled 2023-06-11: qty 100

## 2023-06-11 MED ORDER — HYDRALAZINE HCL 20 MG/ML IJ SOLN
10.0000 mg | INTRAMUSCULAR | Status: DC | PRN
  Administered 2023-06-11: 10 mg via INTRAVENOUS
  Filled 2023-06-11: qty 1

## 2023-06-11 MED ORDER — ACETAMINOPHEN 650 MG RE SUPP: 650.0000 mg | Freq: Three times a day (TID) | RECTAL | Status: DC | PRN

## 2023-06-11 MED ORDER — PANTOPRAZOLE SODIUM 40 MG IV SOLR
40.0000 mg | Freq: Once | INTRAVENOUS | Status: AC
  Administered 2023-06-11: 40 mg via INTRAVENOUS

## 2023-06-11 NOTE — ED Triage Notes (Signed)
 Pt reports 2 weeks ago she had dark tarry stools and dark emesis. Pt reports that it stopped on Saturday. Pt reports that the dark emesis started back this morning. Denies blood thinner use.

## 2023-06-11 NOTE — ED Provider Notes (Signed)
  EMERGENCY DEPARTMENT AT [facility] Provider Note   CSN: [id number] Arrival date & time: 06/11/23  1048     History  Chief Complaint  Patient presents with   Hematemesis    Tammy [name] is a 68 y.o. female.  HPI   Patient has a history of arthritis depression acid reflux hypertension bipolar disorder stroke, GI bleeding.  Patient states she does not take NSAIDs and is no longer on anticoagulation.  She noticed some dark stools last week and had an episode of coffee-ground emesis.  Patient states those symptoms resolved.  Last evening however she had another episode of vomiting and noted some burning discomfort in her abdomen.  She also had an episode of dark stools.  Patient is currently not having any pain.  No fevers.  She has not vomited today.  Home Medications Prior to Admission medications   Medication Sig Start Date End Date Taking? Authorizing Provider  amLODipine  (NORVASC ) 10 MG tablet Take 1 tablet (10 mg total) by mouth daily. 04/22/21   Setzer, Sandra J, PA-C  anastrozole  (ARIMIDEX ) 1 MG tablet Take 1 tablet (1 mg total) by mouth daily. 12/11/22   Sonja Ramos, MD  Ascorbic Acid  (VITAMIN C ) 1000 MG tablet Take 1,000 mg by mouth daily.    [provider]  buPROPion  (WELLBUTRIN  SR) 150 MG 12 hr tablet Take 150 mg by mouth 2 (two) times daily. 10/04/21   [provider]  calcium  carbonate (OS-CAL - DOSED IN MG OF ELEMENTAL CALCIUM ) 1250 (500 Ca) MG tablet Take 1 tablet by mouth daily.    [provider]  Cyanocobalamin  (VITAMIN B-12 PO) Take 2,000 mcg by mouth daily.    [provider]  DULoxetine  (CYMBALTA ) 30 MG capsule Take 1 capsule (30 mg total) by mouth daily. 05/24/23 05/23/24  Lylia Sand, MD  gabapentin  (NEURONTIN ) 300 MG capsule Take 1 capsule (300 mg total) by mouth at bedtime. 03/26/23   Boscia, Heather E, NP  hydrALAZINE  (APRESOLINE ) 25 MG tablet Take 1 tablet (25 mg total) by mouth 2 (two) times  daily. 02/21/23   Shalhoub, Merrill Abide, MD  HYDROcodone -acetaminophen  (NORCO) 10-325 MG tablet Take 1 tablet by mouth every 4 (four) hours as needed (for pain).    [provider]  losartan  (COZAAR ) 50 MG tablet Take 1 tablet (50 mg total) by mouth daily. 02/22/23   Shalhoub, Merrill Abide, MD  Menthol , Topical Analgesic, (BENGAY EX) Apply 1 Application topically daily as needed (pain).    [provider]  pantoprazole  (PROTONIX ) 40 MG tablet Take 1 tablet (40 mg total) by mouth daily. 02/21/23   Shalhoub, Merrill Abide, MD  potassium chloride  (KLOR-CON ) 10 MEQ tablet Take 10 mEq by mouth daily. 04/07/22   [provider]  Prenatal Vit-Fe Fumarate-FA (PRENATAL PO) Take 1 tablet by mouth daily.    [provider]  promethazine  (PHENERGAN ) 25 MG tablet Take 1 tablet (25 mg total) by mouth every 6 (six) hours as needed for nausea or vomiting. 02/21/23   Shalhoub, Merrill Abide, MD  zinc  gluconate 50 MG tablet Take 50 mg by mouth daily.    [provider]      Allergies    Lisinopril and Lyrica  [pregabalin ]    Review of Systems   Review of Systems  Physical Exam Updated Vital Signs BP (!) 161/94   Pulse 67   Temp 98.9 F (37.2 C) (Oral)   Resp 18   SpO2 100%  Physical Exam Vitals and nursing note  reviewed.  Constitutional:      General: She is not in acute distress.    Appearance: She is well-developed.  HENT:     Head: Normocephalic and atraumatic.     Right Ear: External ear normal.     Left Ear: External ear normal.  Eyes:     General: No scleral icterus.       Right eye: No discharge.        Left eye: No discharge.     Conjunctiva/sclera: Conjunctivae normal.  Neck:     Trachea: No tracheal deviation.  Cardiovascular:     Rate and Rhythm: Normal rate and regular rhythm.  Pulmonary:     Effort: Pulmonary effort is normal. No respiratory distress.     Breath sounds: Normal breath sounds. No stridor. No wheezing or rales.  Abdominal:     General:  Bowel sounds are normal. There is no distension.     Palpations: Abdomen is soft.     Tenderness: There is no abdominal tenderness. There is no guarding or rebound.  Genitourinary:    Comments: No melena noted on rectal exam, tan-colored stool Musculoskeletal:        General: No tenderness or deformity.     Cervical back: Neck supple.  Skin:    General: Skin is warm and dry.     Findings: No rash.  Neurological:     General: No focal deficit present.     Mental Status: She is alert.     Cranial Nerves: No cranial nerve deficit, dysarthria or facial asymmetry.     Sensory: No sensory deficit.     Motor: No abnormal muscle tone or seizure activity.     Coordination: Coordination normal.  Psychiatric:        Mood and Affect: Mood normal.     ED Results / Procedures / Treatments   Labs (all labs ordered are listed, but only abnormal results are displayed) Labs Reviewed  CBC - Abnormal; Notable for the following components:      Result Value   Hemoglobin 11.9 (*)    All other components within normal limits  COMPREHENSIVE METABOLIC PANEL WITH GFR - Abnormal; Notable for the following components:   Potassium 3.1 (*)    Chloride 97 (*)    Glucose, Bld 115 (*)    Calcium  11.7 (*)    All other components within normal limits  POC OCCULT BLOOD, ED - Abnormal; Notable for the following components:   Fecal Occult Bld POSITIVE (*)    All other components within normal limits  LIPASE, BLOOD  PROTIME-INR  TYPE AND SCREEN    EKG None  Radiology No results found.  Procedures Procedures    Medications Ordered in ED Medications  pantoprazole  (PROTONIX ) injection 40 mg (has no administration in time range)    ED Course/ Medical Decision Making/ A&P Clinical Course as of 06/11/23 1719  Fri Jun 11, 2023  1216 POC occult blood, ED(!) Hemoccult positive. [JK]  1216 CBC(!) Hemoglobin stable compared to previous [JK]  1216 Comprehensive metabolic panel(!) Potassium level  decreased [JK]  1249 Case discussed with Dr  Bridgett Camps.  Recommends admission. GI will see patient [JK]  61 CAse discussed with Dr Bonita Bussing [JK]    Clinical Course User Index [JK] Trish Furl, MD                                 Medical Decision Making Problems  Addressed: Gastrointestinal hemorrhage, unspecified gastrointestinal hemorrhage type: acute illness or injury that poses a threat to life or bodily functions  Amount and/or Complexity of Data Reviewed Labs: ordered. Decision-making details documented in ED Course.  Risk Prescription drug management. Decision regarding hospitalization.   Patient presented to ED with complaints of having dark stools as well as coffee-ground emesis.  Patient does have history of prior GI bleeding.  She also has history of chronic hepatitis C with cirrhosis.  Patient is not having any active hematemesis here.  She is hemodynamically stable.  No melena on rectal exam but she is guaiac positive.  With her prior history of GI bleeding will plan on admission to the hospital for observation.          Final Clinical Impression(s) / ED Diagnoses Final diagnoses:  Gastrointestinal hemorrhage, unspecified gastrointestinal hemorrhage type    Rx / DC Orders ED Discharge Orders     None         Trish Furl, MD 06/11/23 1719

## 2023-06-11 NOTE — Consult Note (Addendum)
 Referring Provider: ? EDP Primary Care Physician:  Wright Heal, MD Primary Gastroenterologist:  Dr. Elvin Hammer  Reason for Consultation:  CGE, dark/heme positive stool  HPI: Tammy [name] is a 68 y.o. female with past medical history as listed below, but history of breast cancer, bipolar disorder, CVA/strokes previously on anticoagulation but no longer on that, hepatitis C for which she follows with [facility], left AKA.  She presented to [facility] with complaints of dark-colored emesis.  She tells me that about 3 weeks ago she started having dark-colored emesis and dark tarry stools.  This was occurring daily, unable to tolerate much by mouth other than sipping on ginger ale.  She says then everything resolved last Saturday and she felt better, stools returned to normal.  Then she had recurrence of the dark-colored emesis last night, multiple occasions.  Last vomiting episode was at 4 AM today.  She tells me she had a bowel movement 8 AM this morning that was normal in color.  She reports a burning sensation in her mid epigastrium area, above the umbilicus.  She tells me that she was taking ibuprofen  dual action on a daily basis for a few weeks for ongoing pain.  Says her Norco was not helping the pain.  She stopped the ibuprofen  about a week or so ago.  Was taking her pantoprazole  regularly at home.  Had a similar episode in January, but was on anticoagulation with Eliquis  at that time.  Had EGD as below.  Hemoglobin is stable/normal 11.9 g.  BUN is normal.  Potassium slightly low at 3.1.  Hemoccult positive.  INR is pending.  EGD January 2025:  1. Incidental benign caliber esophageal ringlike stricture 2. No varices 3. Moderately large hiatal hernia 4. Few diminutive gastric polyps 5. Otherwise unremarkable exam. No significant pathology identified. Suspect that she had coffee- ground emesis on the basis of transient retching gastropathy while anticoagulated.  Fortunately, the exam currently is quite benign.   Past Medical History:  Diagnosis Date   Anemia    hx of   Anxiety    on meds   Arthritis    back/bilateral ankles   Bipolar disorder (HCC)    Breast cancer (HCC) 2022   LEFT breast-radiation   Depression    on meds   Family history of breast cancer 07/10/2020   Family history of colon cancer 07/10/2020   Family history of ovarian cancer 07/10/2020   GERD (gastroesophageal reflux disease)    on meds   Heart murmur    dx by PCP   Hepatitis C    HTN (hypertension)    on meds   Hx of radiation therapy    Liver cirrhosis (HCC)    from hep c   Neuromuscular disorder (HCC)    Osteopenia    Personal history of radiation therapy    Stroke (HCC) 09/2012   slurred speech and LEFT sided weakness/notes 10/04/2012    Past Surgical History:  Procedure Laterality Date   AMPUTATION Left 11/04/2022   Procedure: LEFT ABOVE KNEE AMPUTATION;  Surgeon: Timothy Ford, MD;  Location: [facility] OR;  Service: Orthopedics;  Laterality: Left;   ANKLE FRACTURE SURGERY Left 2018   APPLICATION OF WOUND VAC Left 06/15/2022   Procedure: APPLICATION OF WOUND VAC;  Surgeon: Thornell Flirt, DO;  Location: [facility];  Service: Plastics;  Laterality: Left;   APPLICATION OF WOUND VAC Left 09/14/2022   Procedure: APPLICATION OF WOUND VAC;  Surgeon: Thornell Flirt,  DO;  Location: [facility] OR;  Service: Plastics;  Laterality: Left;   BREAST LUMPECTOMY Left 07/2020   BREAST LUMPECTOMY WITH RADIOACTIVE SEED AND SENTINEL LYMPH NODE BIOPSY Left 07/26/2020   Procedure: LEFT BREAST LUMPECTOMY WITH RADIOACTIVE SEED AND SENTINEL LYMPH NODE BIOPSY;  Surgeon: Oza Blumenthal, MD;  Location:  SURGERY CENTER;  Service: General;  Laterality: Left;   CESAREAN SECTION  1986   COLONOSCOPY     DIAGNOSTIC LAPAROSCOPY  1985   ESOPHAGOGASTRODUODENOSCOPY N/A 02/21/2023   Procedure: ESOPHAGOGASTRODUODENOSCOPY (EGD);  Surgeon: Tobin Forts, MD;  Location: [facility];   Service: Gastroenterology;  Laterality: N/A;   HARDWARE REMOVAL Left 04/30/2022   Procedure: HARDWARE REMOVAL LEFT FEMUR;  Surgeon: Adonica Hoose, MD;  Location: [facility];  Service: Orthopedics;  Laterality: Left;  150   HARDWARE REMOVAL Left 11/04/2022   Procedure: HARDWARE REMOVAL INFECTED LEFT TOTAL KNEE ARTHROPLASTY;  Surgeon: Timothy Ford, MD;  Location: [facility] OR;  Service: Orthopedics;  Laterality: Left;   I & D KNEE WITH POLY EXCHANGE Left 06/07/2022   Procedure: IRRIGATION AND DEBRIDEMENT KNEE WITH POLY EXCHANGE;  Surgeon: Adonica Hoose, MD;  Location: [facility];  Service: Orthopedics;  Laterality: Left;   INCISION AND DRAINAGE OF WOUND Left 06/04/2022   Procedure: IRRIGATION AND DEBRIDEMENT KNEE;  Surgeon: Adonica Hoose, MD;  Location: [facility];  Service: Orthopedics;  Laterality: Left;  60   INCISION AND DRAINAGE OF WOUND Left 06/15/2022   Procedure: excision of knee wound with Myriad;  Surgeon: Thornell Flirt, DO;  Location: [facility];  Service: Plastics;  Laterality: Left;   ORIF FEMUR FRACTURE Left 04/10/2021   Procedure: OPEN REDUCTION INTERNAL FIXATION (ORIF) DISTAL FEMUR FRACTURE;  Surgeon: Hardy Lia, MD;  Location: [facility] OR;  Service: Orthopedics;  Laterality: Left;   PARTIAL KNEE ARTHROPLASTY Left 04/30/2022   Procedure: DISTAL FEMUR REPLACEMENT;  Surgeon: Adonica Hoose, MD;  Location: [facility];  Service: Orthopedics;  Laterality: Left;  150    Prior to Admission medications   Medication Sig Start Date End Date Taking? Authorizing Provider  amLODipine  (NORVASC ) 10 MG tablet Take 1 tablet (10 mg total) by mouth daily. 04/22/21   Setzer, Sandra J, PA-C  anastrozole  (ARIMIDEX ) 1 MG tablet Take 1 tablet (1 mg total) by mouth daily. 12/11/22   Sonja [place], MD  Ascorbic Acid  (VITAMIN C ) 1000 MG tablet Take 1,000 mg by mouth daily.    [provider]  buPROPion  (WELLBUTRIN  SR) 150 MG 12 hr tablet Take 150 mg by mouth 2 (two) times daily. 10/04/21   [provider]   calcium  carbonate (OS-CAL - DOSED IN MG OF ELEMENTAL CALCIUM ) 1250 (500 Ca) MG tablet Take 1 tablet by mouth daily.    [provider]  Cyanocobalamin  (VITAMIN B-12 PO) Take 2,000 mcg by mouth daily.    [provider]  DULoxetine  (CYMBALTA ) 30 MG capsule Take 1 capsule (30 mg total) by mouth daily. 05/24/23 05/23/24  Lylia Sand, MD  gabapentin  (NEURONTIN ) 300 MG capsule Take 1 capsule (300 mg total) by mouth at bedtime. 03/26/23   Boscia, Heather E, NP  hydrALAZINE  (APRESOLINE ) 25 MG tablet Take 1 tablet (25 mg total) by mouth 2 (two) times daily. 02/21/23   Shalhoub, Merrill Abide, MD  HYDROcodone -acetaminophen  (NORCO) 10-325 MG tablet Take 1 tablet by mouth every 4 (four) hours as needed (for pain).    [provider]  losartan  (COZAAR ) 50 MG tablet Take 1 tablet (50 mg total) by mouth daily. 02/22/23   Shalhoub, Merrill Abide,  MD  Menthol , Topical Analgesic, (BENGAY EX) Apply 1 Application topically daily as needed (pain).    [provider]  pantoprazole  (PROTONIX ) 40 MG tablet Take 1 tablet (40 mg total) by mouth daily. 02/21/23   Shalhoub, Merrill Abide, MD  potassium chloride  (KLOR-CON ) 10 MEQ tablet Take 10 mEq by mouth daily. 04/07/22   [provider]  Prenatal Vit-Fe Fumarate-FA (PRENATAL PO) Take 1 tablet by mouth daily.    [provider]  promethazine  (PHENERGAN ) 25 MG tablet Take 1 tablet (25 mg total) by mouth every 6 (six) hours as needed for nausea or vomiting. 02/21/23   Shalhoub, Merrill Abide, MD  zinc  gluconate 50 MG tablet Take 50 mg by mouth daily.    [provider]    Current Facility-Administered Medications  Medication Dose Route Frequency Provider Last Rate Last Admin   pantoprazole  (PROTONIX ) injection 40 mg  40 mg Intravenous Once Knapp, Jon, MD       Current Outpatient Medications  Medication Sig Dispense Refill   amLODipine  (NORVASC ) 10 MG tablet Take 1 tablet (10 mg total) by mouth daily. 30 tablet 0   anastrozole   (ARIMIDEX ) 1 MG tablet Take 1 tablet (1 mg total) by mouth daily. 90 tablet 3   Ascorbic Acid  (VITAMIN C ) 1000 MG tablet Take 1,000 mg by mouth daily.     buPROPion  (WELLBUTRIN  SR) 150 MG 12 hr tablet Take 150 mg by mouth 2 (two) times daily.     calcium  carbonate (OS-CAL - DOSED IN MG OF ELEMENTAL CALCIUM ) 1250 (500 Ca) MG tablet Take 1 tablet by mouth daily.     Cyanocobalamin  (VITAMIN B-12 PO) Take 2,000 mcg by mouth daily.     DULoxetine  (CYMBALTA ) 30 MG capsule Take 1 capsule (30 mg total) by mouth daily. 30 capsule 3   gabapentin  (NEURONTIN ) 300 MG capsule Take 1 capsule (300 mg total) by mouth at bedtime. 30 capsule 1   hydrALAZINE  (APRESOLINE ) 25 MG tablet Take 1 tablet (25 mg total) by mouth 2 (two) times daily. 60 tablet 2   HYDROcodone -acetaminophen  (NORCO) 10-325 MG tablet Take 1 tablet by mouth every 4 (four) hours as needed (for pain).     losartan  (COZAAR ) 50 MG tablet Take 1 tablet (50 mg total) by mouth daily. 30 tablet 2   Menthol , Topical Analgesic, (BENGAY EX) Apply 1 Application topically daily as needed (pain).     pantoprazole  (PROTONIX ) 40 MG tablet Take 1 tablet (40 mg total) by mouth daily. 30 tablet 2   potassium chloride  (KLOR-CON ) 10 MEQ tablet Take 10 mEq by mouth daily.     Prenatal Vit-Fe Fumarate-FA (PRENATAL PO) Take 1 tablet by mouth daily.     promethazine  (PHENERGAN ) 25 MG tablet Take 1 tablet (25 mg total) by mouth every 6 (six) hours as needed for nausea or vomiting. 15 tablet 0   zinc  gluconate 50 MG tablet Take 50 mg by mouth daily.      Allergies as of 06/11/2023 - Review Complete 06/11/2023  Allergen Reaction Noted   Lisinopril Cough 04/09/2021   Lyrica  [pregabalin ]  04/08/2023    Family History  Problem Relation Age of Onset   Breast cancer Mother    Other Mother        blood clots   Diabetes Sister    Neuropathy Sister        diabetic neuropathy   Breast cancer Maternal Aunt        22   Colon cancer Maternal Aunt 8   Diabetes  Paternal  Aunt    Diabetes Brother    Neuropathy Brother        diabetic neuropathy   Breast cancer Other        PGM's sister; dx mid 73s   Colon cancer Other        PGM's brother; dx 37s   Ovarian cancer Other        PGM's mother; dx unknown age   Esophageal cancer Neg Hx    Rectal cancer Neg Hx    Stomach cancer Neg Hx    Colon polyps Neg Hx     Social History   Socioeconomic History   Marital status: Significant Other    Spouse name: Not on file   Number of children: 2   Years of education: Not on file   Highest education level: Not on file  Occupational History   Occupation: diabled  Tobacco Use   Smoking status: Former    Current packs/day: 1.50    Average packs/day: 1.5 packs/day for 40.0 years (60.0 ttl pk-yrs)    Types: Cigarettes   Smokeless tobacco: Never   Tobacco comments:    has used e-cigarettes, no vaping   Vaping Use   Vaping status: Never Used  Substance and Sexual Activity   Alcohol  use: Not Currently   Drug use: No   Sexual activity: Not Currently  Other Topics Concern   Not on file  Social History Narrative   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   Caffeine: 1 cup in the morning    Social Drivers of Health   Financial Resource Strain: Not on file  Food Insecurity: No Food Insecurity (02/19/2023)   Hunger Vital Sign    Worried About Running Out of Food in the Last Year: Never true    Ran Out of Food in the Last Year: Never true  Transportation Needs: No Transportation Needs (02/19/2023)   PRAPARE - Administrator, Civil Service (Medical): No    Lack of Transportation (Non-Medical): No  Physical Activity: Not on file  Stress: Not on file  Social Connections: Socially Integrated (02/19/2023)   Social Connection and Isolation Panel [NHANES]    Frequency of Communication with Friends and Family: More than three times a week    Frequency of Social Gatherings with Friends and Family: Three times a week    Attends Religious  Services: More than 4 times per year    Active Member of Clubs or Organizations: Yes    Attends Banker Meetings: More than 4 times per year    Marital Status: Living with partner  Intimate Partner Violence: Not At Risk (02/19/2023)   Humiliation, Afraid, Rape, and Kick questionnaire    Fear of Current or Ex-Partner: No    Emotionally Abused: No    Physically Abused: No    Sexually Abused: No    Review of Systems: ROS is O/W negative except as mentioned in HPI.  Physical Exam: Vital signs in last 24 hours: Temp:  [98.9 F (37.2 C)] 98.9 F (37.2 C) (05/02 1054) Pulse Rate:  [67-71] 67 (05/02 1200) Resp:  [18] 18 (05/02 1054) BP: (158-161)/(94-107) 161/94 (05/02 1200) SpO2:  [98 %-100 %] 100 % (05/02 1200)   General:  Alert, Well-developed, well-nourished, pleasant and cooperative in NAD Head:  Normocephalic and atraumatic. Eyes:  Sclera clear, no icterus.  Conjunctiva pink. Ears:  Normal auditory acuity. Mouth:  No deformity or lesions.   Lungs:  Clear  throughout to auscultation.  No wheezes, crackles, or rhonchi.  Heart:  Regular rate and rhythm; no murmurs, clicks, rubs, or gallops. Abdomen:  Soft, non-distended.  BS present.  Non-tender.   Rectal:  Deferred.  Heme positive by the EDP.  Extremities: Left AKA. Neurologic:  Alert and oriented x 4;  grossly normal neurologically. Skin:  Intact without significant lesions or rashes. Psych:  Alert and cooperative. Normal mood and affect.  Lab Results: Recent Labs    06/11/23 1108  WBC 5.0  HGB 11.9*  HCT 37.4  PLT 308   BMET Recent Labs    06/11/23 1108  NA 136  K 3.1*  CL 97*  CO2 28  GLUCOSE 115*  BUN 10  CREATININE 0.67  CALCIUM  11.7*   LFT Recent Labs    06/11/23 1108  PROT 8.0  ALBUMIN 4.2  AST 18  ALT 16  ALKPHOS 78  BILITOT 0.2   IMPRESSION:  1.  Coffee-ground emesis and dark stools/heme positive.  Symptoms started about 3 weeks ago, lasted a couple of weeks consistently and  then resolved about a week ago.  Recurred again last night, but this time just with the dark emesis, stool this morning appeared normal in color cording to the patient.  Was heme positive here in the ED.  Hemoglobin stable/normal.  BUN normal.  Was using NSAIDs in the form of ibuprofen  dual action for about 3 weeks. 2.  History of hepatitis C without evidence of portal hypertension on noninvasive testing and laboratories.  No varices, etc. seen on EGD in January 2025.  Follows with [facility]. 3.  Multiple medical problems 4.  History of CVA previously on Eliquis , no longer on anticoagulation 5.  Hypokalemia with potassium of 3.1  PLAN: - Monitor hemoglobin and transfuse if needed. - Protonix  40 mg IV twice daily. - Probably okay for ice chips and sips with meds as no vomiting since 4 AM. - Any plans for endoscopic evaluation per Dr. Elvin Hammer, but will likely not occur today. - Likely needs to avoid NSAIDs.  Will need better/different pain control by her PCP.  Martina Sledge. Zehr  06/11/2023, 1:19 PM  GI ATTENDING  History, laboratories, x-rays reviewed.  Agree with comprehensive consultation note as outlined.  Patient known to me from January admission where I performed upper endoscopy for similar complaints of coffee-ground emesis.  No significant findings.  The patient is stable.  Hemoglobin stable.  Hypokalemia noted.  At this point I favor observation with PPI therapy given recent endoscopy findings and current stable status.  If doing well tomorrow without evidence of bleeding, sent home on PPI.  We will follow.  Dr. Cherryl Corona on-call this weekend.  Murel [place]. Willey Harrier., M.D. [facility] Division of Gastroenterology

## 2023-06-11 NOTE — H&P (Signed)
 History and Physical    Patient: Tammy [name] ZOX:[id number] DOB: 01-Sep-1955 DOA: 06/11/2023 DOS: the patient was seen and examined on 06/11/2023 PCP: Wright Heal, MD  Patient coming from: Home  Chief Complaint:  Chief Complaint  Patient presents with   Hematemesis   HPI: Tammy [name] is a 68 y.o. female with medical history significant of anemia, anxiety, depression, bipolar disorder, osteoarthritis, left breast cancer, history of radiotherapy, GERD, heart murmur, hep C, history of hypertension, liver cirrhosis, osteopenia, history of CVA with left-sided deficit who presented to the emergency department with complaints of on and off episodes of hematemesis associated with melena in the last few weeks after she started taking ibuprofen  for joint pain as she says that her hydrocodone /acetaminophen  tablets were not being effective enough.  She stopped ibuprofen  last week after she had an episode of both hematemesis.  She has had some weight loss as she has only been drinking ginger ale.  Earlier today, she had an episode of hematemesis and later in the morning had another episode of melena so she decided to come to the emergency department.  She denied cigarette smoking, alcohol , caffeine or spicy food consumption.  She has been taking her pantoprazole .  She denied fever, chills, rhinorrhea, sore throat, wheezing or hemoptysis.  No chest pain, palpitations, diaphoresis, PND, orthopnea or pitting edema of the lower extremities.  No flank pain, dysuria, frequency or hematuria.  No polyuria, polydipsia, polyphagia or blurred vision.   Lab work: Fecal occult blood was positive.  CBC showed white count of 5.0, hemoglobin 11.9 g/dL and platelets 811.  PT 13.8/INR 1.0.  Lipase was normal.  CMP showed potassium of 3.1 mmol/L and chloride of 97 mmol/L.  Glucose 115 and calcium  11.7 mg/dL.  The rest of the electrolytes, hepatic and renal function were normal.  ED course: Initial vital signs  were temperature 99.9 F, pulse 71, respiration 19, BP 158/107 mmHg O2 sat 98% on room air.  The patient received Protonix  40 mg IVP.   Review of Systems: As mentioned in the history of present illness. All other systems reviewed and are negative. Past Medical History:  Diagnosis Date   Anemia    hx of   Anxiety    on meds   Arthritis    back/bilateral ankles   Bipolar disorder (HCC)    Breast cancer (HCC) 2022   LEFT breast-radiation   Depression    on meds   Family history of breast cancer 07/10/2020   Family history of colon cancer 07/10/2020   Family history of ovarian cancer 07/10/2020   GERD (gastroesophageal reflux disease)    on meds   Heart murmur    dx by PCP   Hepatitis C    HTN (hypertension)    on meds   Hx of radiation therapy    Liver cirrhosis (HCC)    from hep c   Neuromuscular disorder (HCC)    Osteopenia    Personal history of radiation therapy    Stroke (HCC) 09/2012   slurred speech and LEFT sided weakness/notes 10/04/2012   Past Surgical History:  Procedure Laterality Date   AMPUTATION Left 11/04/2022   Procedure: LEFT ABOVE KNEE AMPUTATION;  Surgeon: Timothy Ford, MD;  Location: [facility] OR;  Service: Orthopedics;  Laterality: Left;   ANKLE FRACTURE SURGERY Left 2018   APPLICATION OF WOUND VAC Left 06/15/2022   Procedure: APPLICATION OF WOUND VAC;  Surgeon: Thornell Flirt, DO;  Location: [facility]S;  Service: Plastics;  Laterality: Left;   APPLICATION OF WOUND VAC Left 09/14/2022   Procedure: APPLICATION OF WOUND VAC;  Surgeon: Thornell Flirt, DO;  Location: [facility] OR;  Service: Plastics;  Laterality: Left;   BREAST LUMPECTOMY Left 07/2020   BREAST LUMPECTOMY WITH RADIOACTIVE SEED AND SENTINEL LYMPH NODE BIOPSY Left 07/26/2020   Procedure: LEFT BREAST LUMPECTOMY WITH RADIOACTIVE SEED AND SENTINEL LYMPH NODE BIOPSY;  Surgeon: Oza Blumenthal, MD;  Location: [facility];  Service: General;  Laterality: Left;   CESAREAN SECTION  1986    COLONOSCOPY     DIAGNOSTIC LAPAROSCOPY  1985   ESOPHAGOGASTRODUODENOSCOPY N/A 02/21/2023   Procedure: ESOPHAGOGASTRODUODENOSCOPY (EGD);  Surgeon: Tobin Forts, MD;  Location: [facility];  Service: Gastroenterology;  Laterality: N/A;   HARDWARE REMOVAL Left 04/30/2022   Procedure: HARDWARE REMOVAL LEFT FEMUR;  Surgeon: Adonica Hoose, MD;  Location: [facility];  Service: Orthopedics;  Laterality: Left;  150   HARDWARE REMOVAL Left 11/04/2022   Procedure: HARDWARE REMOVAL INFECTED LEFT TOTAL KNEE ARTHROPLASTY;  Surgeon: Timothy Ford, MD;  Location: [facility] OR;  Service: Orthopedics;  Laterality: Left;   I & D KNEE WITH POLY EXCHANGE Left 06/07/2022   Procedure: IRRIGATION AND DEBRIDEMENT KNEE WITH POLY EXCHANGE;  Surgeon: Adonica Hoose, MD;  Location: [facility];  Service: Orthopedics;  Laterality: Left;   INCISION AND DRAINAGE OF WOUND Left 06/04/2022   Procedure: IRRIGATION AND DEBRIDEMENT KNEE;  Surgeon: Adonica Hoose, MD;  Location: [facility];  Service: Orthopedics;  Laterality: Left;  60   INCISION AND DRAINAGE OF WOUND Left 06/15/2022   Procedure: excision of knee wound with Myriad;  Surgeon: Thornell Flirt, DO;  Location: [facility];  Service: Plastics;  Laterality: Left;   ORIF FEMUR FRACTURE Left 04/10/2021   Procedure: OPEN REDUCTION INTERNAL FIXATION (ORIF) DISTAL FEMUR FRACTURE;  Surgeon: Hardy Lia, MD;  Location: [facility] OR;  Service: Orthopedics;  Laterality: Left;   PARTIAL KNEE ARTHROPLASTY Left 04/30/2022   Procedure: DISTAL FEMUR REPLACEMENT;  Surgeon: Adonica Hoose, MD;  Location: [facility];  Service: Orthopedics;  Laterality: Left;  150   Social History:  reports that she has quit smoking. Her smoking use included cigarettes. She has a 60 pack-year smoking history. She has never used smokeless tobacco. She reports that she does not currently use alcohol . She reports that she does not use drugs.  Allergies  Allergen Reactions   Lisinopril Cough   Lyrica  [Pregabalin ]     sedation     Family History  Problem Relation Age of Onset   Breast cancer Mother    Other Mother        blood clots   Diabetes Sister    Neuropathy Sister        diabetic neuropathy   Breast cancer Maternal Aunt        61   Colon cancer Maternal Aunt 40   Diabetes Paternal Aunt    Diabetes Brother    Neuropathy Brother        diabetic neuropathy   Breast cancer Other        PGM's sister; dx mid 59s   Colon cancer Other        PGM's brother; dx 77s   Ovarian cancer Other        PGM's mother; dx unknown age   Esophageal cancer Neg Hx    Rectal cancer Neg Hx    Stomach cancer Neg Hx    Colon polyps Neg Hx     Prior to Admission medications  Medication Sig Start Date End Date Taking? Authorizing Provider  amLODipine  (NORVASC ) 10 MG tablet Take 1 tablet (10 mg total) by mouth daily. 04/22/21   Setzer, Sandra J, PA-C  anastrozole  (ARIMIDEX ) 1 MG tablet Take 1 tablet (1 mg total) by mouth daily. 12/11/22   Sonja Hills and Dales, MD  Ascorbic Acid  (VITAMIN C ) 1000 MG tablet Take 1,000 mg by mouth daily.    [provider]  buPROPion  (WELLBUTRIN  SR) 150 MG 12 hr tablet Take 150 mg by mouth 2 (two) times daily. 10/04/21   [provider]  calcium  carbonate (OS-CAL - DOSED IN MG OF ELEMENTAL CALCIUM ) 1250 (500 Ca) MG tablet Take 1 tablet by mouth daily.    [provider]  Cyanocobalamin  (VITAMIN B-12 PO) Take 2,000 mcg by mouth daily.    [provider]  DULoxetine  (CYMBALTA ) 30 MG capsule Take 1 capsule (30 mg total) by mouth daily. 05/24/23 05/23/24  Lylia Sand, MD  gabapentin  (NEURONTIN ) 300 MG capsule Take 1 capsule (300 mg total) by mouth at bedtime. 03/26/23   Boscia, Heather E, NP  hydrALAZINE  (APRESOLINE ) 25 MG tablet Take 1 tablet (25 mg total) by mouth 2 (two) times daily. 02/21/23   Shalhoub, Merrill Abide, MD  HYDROcodone -acetaminophen  (NORCO) 10-325 MG tablet Take 1 tablet by mouth every 4 (four) hours as needed (for pain).    [provider]  losartan   (COZAAR ) 50 MG tablet Take 1 tablet (50 mg total) by mouth daily. 02/22/23   Shalhoub, Merrill Abide, MD  Menthol , Topical Analgesic, (BENGAY EX) Apply 1 Application topically daily as needed (pain).    [provider]  pantoprazole  (PROTONIX ) 40 MG tablet Take 1 tablet (40 mg total) by mouth daily. 02/21/23   Shalhoub, Merrill Abide, MD  potassium chloride  (KLOR-CON ) 10 MEQ tablet Take 10 mEq by mouth daily. 04/07/22   [provider]  Prenatal Vit-Fe Fumarate-FA (PRENATAL PO) Take 1 tablet by mouth daily.    [provider]  promethazine  (PHENERGAN ) 25 MG tablet Take 1 tablet (25 mg total) by mouth every 6 (six) hours as needed for nausea or vomiting. 02/21/23   Shalhoub, Merrill Abide, MD  zinc  gluconate 50 MG tablet Take 50 mg by mouth daily.    [provider]    Physical Exam: Vitals:   06/11/23 1054 06/11/23 1200  BP: (!) 158/107 (!) 161/94  Pulse: 71 67  Resp: 18   Temp: 98.9 F (37.2 C)   TempSrc: Oral   SpO2: 98% 100%   Physical Exam Vitals reviewed.  Constitutional:      General: She is awake. She is not in acute distress.    Appearance: She is ill-appearing.  HENT:     Head: Normocephalic.     Nose: No rhinorrhea.     Mouth/Throat:     Mouth: Mucous membranes are moist.  Eyes:     General: No scleral icterus.    Pupils: Pupils are equal, round, and reactive to light.  Neck:     Vascular: No JVD.  Cardiovascular:     Rate and Rhythm: Normal rate and regular rhythm.     Heart sounds: S1 normal and S2 normal.  Pulmonary:     Effort: Pulmonary effort is normal.     Breath sounds: Normal breath sounds.  Abdominal:     General: Bowel sounds are normal. There is no distension.     Palpations: Abdomen is soft.     Tenderness: There is no abdominal tenderness. There is no guarding.  Musculoskeletal:     Cervical back: Neck supple.     Right lower leg: No edema.     Left lower leg: No edema.     Left Lower Extremity: Left leg is amputated above  knee.  Skin:    General: Skin is warm and dry.     Coloration: Skin is pale.  Neurological:     General: No focal deficit present.     Mental Status: She is alert and oriented to person, place, and time.  Psychiatric:        Mood and Affect: Mood normal.        Behavior: Behavior normal. Behavior is cooperative.     Data Reviewed:  Results are pending, will review when available.  Assessment and Plan: Principal Problem:   Melena With associated:   ABLA (acute blood loss anemia) Admit to PCU/inpatient. Keep NPO for now. Continue IV fluids. Continue pantoprazole  40 mg every 8 hours. Monitor H&H. Transfuse as needed. GI consult appreciated.  Active Problems:   Chronic pain syndrome Currently taking Norco 10/325 mg tablets. She is n.p.o. so we will substitute with IV hydromorphone .    Essential hypertension Currently NPO.    Hypokalemia Replacing. Follow potassium level.    Chronic hepatitis C with cirrhosis (HCC) Monitor LFTs.    S/P AKA (above knee amputation) unilateral, left (HCC) Supportive care. Analgesics as needed.    GERD (gastroesophageal reflux disease) On parenteral PPI.    Hypercalcemia High calcium  intake with current symptoms? Will recheck in the morning. Further workup depending on results.    Advance Care Planning:   Code Status: Full Code   Consults: [facility].  Family Communication:   Severity of Illness: The appropriate patient status for this patient is INPATIENT. Inpatient status is judged to be reasonable and necessary in order to provide the required intensity of service to ensure the patient's safety. The patient's presenting symptoms, physical exam findings, and initial radiographic and laboratory data in the context of their chronic comorbidities is felt to place them at high risk for further clinical deterioration. Furthermore, it is not anticipated that the patient will be medically stable for discharge from the  hospital within 2 midnights of admission.   * I certify that at the point of admission it is my clinical judgment that the patient will require inpatient hospital care spanning beyond 2 midnights from the point of admission due to high intensity of service, high risk for further deterioration and high frequency of surveillance required.*  Author: Danice Dural, MD 06/11/2023 1:26 PM  For on call review [email or web address].   This document was prepared using Dragon voice recognition software and may contain some unintended transcription errors.

## 2023-06-11 NOTE — Plan of Care (Signed)
   Problem: Education: Goal: Knowledge of General Education information will improve Description Including pain rating scale, medication(s)/side effects and non-pharmacologic comfort measures Outcome: Progressing   Problem: Health Behavior/Discharge Planning: Goal: Ability to manage health-related needs will improve Outcome: Progressing

## 2023-06-12 ENCOUNTER — Other Ambulatory Visit (HOSPITAL_COMMUNITY): Payer: Self-pay

## 2023-06-12 DIAGNOSIS — K920 Hematemesis: Secondary | ICD-10-CM | POA: Diagnosis not present

## 2023-06-12 DIAGNOSIS — K7460 Unspecified cirrhosis of liver: Secondary | ICD-10-CM

## 2023-06-12 DIAGNOSIS — D649 Anemia, unspecified: Secondary | ICD-10-CM | POA: Diagnosis not present

## 2023-06-12 DIAGNOSIS — Z89612 Acquired absence of left leg above knee: Secondary | ICD-10-CM

## 2023-06-12 DIAGNOSIS — E876 Hypokalemia: Secondary | ICD-10-CM | POA: Diagnosis not present

## 2023-06-12 DIAGNOSIS — K922 Gastrointestinal hemorrhage, unspecified: Secondary | ICD-10-CM | POA: Diagnosis present

## 2023-06-12 DIAGNOSIS — K921 Melena: Secondary | ICD-10-CM | POA: Diagnosis not present

## 2023-06-12 DIAGNOSIS — I10 Essential (primary) hypertension: Secondary | ICD-10-CM

## 2023-06-12 DIAGNOSIS — B182 Chronic viral hepatitis C: Secondary | ICD-10-CM | POA: Diagnosis not present

## 2023-06-12 DIAGNOSIS — G894 Chronic pain syndrome: Secondary | ICD-10-CM

## 2023-06-12 LAB — CBC
HCT: 32.4 % — ABNORMAL LOW (ref 36.0–46.0)
Hemoglobin: 10.2 g/dL — ABNORMAL LOW (ref 12.0–15.0)
MCH: 28.2 pg (ref 26.0–34.0)
MCHC: 31.5 g/dL (ref 30.0–36.0)
MCV: 89.5 fL (ref 80.0–100.0)
Platelets: 242 10*3/uL (ref 150–400)
RBC: 3.62 MIL/uL — ABNORMAL LOW (ref 3.87–5.11)
RDW: 15.1 % (ref 11.5–15.5)
WBC: 5.4 10*3/uL (ref 4.0–10.5)
nRBC: 0 % (ref 0.0–0.2)

## 2023-06-12 LAB — COMPREHENSIVE METABOLIC PANEL WITH GFR
ALT: 12 U/L (ref 0–44)
AST: 16 U/L (ref 15–41)
Albumin: 3.5 g/dL (ref 3.5–5.0)
Alkaline Phosphatase: 68 U/L (ref 38–126)
Anion gap: 10 (ref 5–15)
BUN: 11 mg/dL (ref 8–23)
CO2: 26 mmol/L (ref 22–32)
Calcium: 10 mg/dL (ref 8.9–10.3)
Chloride: 101 mmol/L (ref 98–111)
Creatinine, Ser: 0.57 mg/dL (ref 0.44–1.00)
GFR, Estimated: >60 mL/min (ref >60)
Glucose, Bld: 91 mg/dL (ref 70–99)
Potassium: 3.2 mmol/L — ABNORMAL LOW (ref 3.5–5.1)
Sodium: 137 mmol/L (ref 135–145)
Total Bilirubin: 0.4 mg/dL (ref 0.0–1.2)
Total Protein: 6.9 g/dL (ref 6.5–8.1)

## 2023-06-12 MED ORDER — LIDOCAINE 5 % EX PTCH
1.0000 | MEDICATED_PATCH | CUTANEOUS | Status: DC
  Administered 2023-06-12: 1 via TRANSDERMAL
  Filled 2023-06-12: qty 1

## 2023-06-12 MED ORDER — LIDOCAINE 5 % EX PTCH
1.0000 | MEDICATED_PATCH | CUTANEOUS | 2 refills | Status: AC
  Filled 2023-06-12: qty 30, 30d supply, fill #0

## 2023-06-12 MED ORDER — PANTOPRAZOLE SODIUM 40 MG PO TBEC
40.0000 mg | DELAYED_RELEASE_TABLET | Freq: Two times a day (BID) | ORAL | 0 refills | Status: AC
  Filled 2023-06-12: qty 60, 30d supply, fill #0

## 2023-06-12 MED ORDER — HYDROCODONE-ACETAMINOPHEN 10-325 MG PO TABS
1.0000 | ORAL_TABLET | Freq: Four times a day (QID) | ORAL | Status: DC | PRN
  Administered 2023-06-12: 1 via ORAL
  Filled 2023-06-12: qty 1

## 2023-06-12 NOTE — Hospital Course (Signed)
 68 y.o. 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 Julio Ohm (circumstances surrounding this are summarized below  in italics), left femoral vein DVT diagnosed 11/14/2022 (eliquis  discontinued 02/2023), anemia, anxiety, bipolar disorder, left breast cancer, depression, GERD, hypertension, history of CVA with residual left-sided hemiparesis (2014), history of hep C liver cirrhosis who presented to [facility] with complaints of epigastric abdominal pain melena and hematemesis.    Upon evaluation in the emergency department fecal occult blood was positive.  Hemoglobin was stable at 11.9.  Patient was initiated on intravenous Protonix  and intravenous fluids.  The hospitalist group was called to assess the patient for admission the hospital.  Gastroenterology was consulted.  It was identified that patient had been taking ibuprofen  as of late which was thought to be the culprit.  Patient was advised to stop using this medication immediately.  GI reviewed the patient's EGD from 02/2023 and felt that a repeat EGD was not necessary at this time.    Patient was managed conservatively and monitored closely throughout the hospitalization with no further evidence of bleeding.  Hemoglobin did drop slightly but this was thought to be due to hemodilution.  Over the course of the patient's observation, patient clinically felt improved and diet was advanced successfully.  Patient was discharged home on 5/3 in improved and stable condition on a 4-week course of twice daily Protonix  followed by 40 mg by mouth once daily.

## 2023-06-12 NOTE — Care Management CC44 (Signed)
 Condition Code 44 Documentation Completed  Patient Details  Name: Tammy [name] MRN: [id number] Date of Birth: Dec 12, 1955   Condition Code 44 given:  Yes Patient signature on Condition Code 44 notice:  Yes Documentation of 2 MD's agreement:  Yes Code 44 added to claim:  Yes    Covey Baller M Callaway Hailes, LCSW 06/12/2023, 4:26 PM

## 2023-06-12 NOTE — Discharge Instructions (Addendum)
 Please take all prescribed medications exactly as instructed including taking Protonix  40mg  by mouth twice daily for one month followed by taking it once daily.  Please consume a low sodium diet Please increase your physical activity as tolerated using an assistive device. Please maintain all outpatient follow-up appointments including follow-up with your primary care provider. Please return to the emergency department if you develop recurrent black stools, bloody vomiting, abdominal pain or fever.

## 2023-06-12 NOTE — Progress Notes (Signed)
 [facility] ROUNDING NOTE   Subjective: Feeling better this am.  No nausea.  No vomiting since Thursday evening.  No melena since last Saturday.  Hgb down a little, likely dilutional   Objective: Vital signs in last 24 hours: Temp:  [98.1 F (36.7 C)-100.6 F (38.1 C)] 98.4 F (36.9 C) (05/03 0524) Pulse Rate:  [63-80] 73 (05/03 0524) Resp:  [16-18] 18 (05/03 0524) BP: (146-174)/(73-107) 149/80 (05/03 0524) SpO2:  [96 %-100 %] 100 % (05/03 0524) Weight:  [16 kg] 64 kg (05/02 2243) Last BM Date : 06/11/23 General: NAD, pleasant African-American female Lungs:  CTA b/l, no w/r/r Heart:  RRR, no m/r/g Abdomen:  Soft, NT, ND, +BS Ext:  No c/c/e, s/p L AKA    Intake/Output from previous day: 05/02 0701 - 05/03 0700 In: 0  Out: 300 [Urine:300] Intake/Output this shift: No intake/output data recorded.   Lab Results: Recent Labs    06/11/23 1108 06/11/23 1423 06/11/23 2033 06/12/23 0409  WBC 5.0  --   --  5.4  HGB 11.9* 11.4* 10.2* 10.2*  PLT 308  --   --  242  MCV 87.8  --   --  89.5   BMET Recent Labs    06/11/23 1108 06/12/23 0409  NA 136 137  K 3.1* 3.2*  CL 97* 101  CO2 28 26  GLUCOSE 115* 91  BUN 10 11  CREATININE 0.67 0.57  CALCIUM  11.7* 10.0   LFT Recent Labs    06/11/23 1108 06/12/23 0409  PROT 8.0 6.9  ALBUMIN 4.2 3.5  AST 18 16  ALT 16 12  ALKPHOS 78 68  BILITOT 0.2 0.4   PT/INR Recent Labs    06/11/23 1423  INR 1.0      Imaging/Other results: No results found.    Assessment and Plan:  68 year old female with a history of HCV cirrhosis, prior stroke, breast cancer and bipolar disorder, admitted with coffee-ground emesis.  She had a similar presentation in January of this year with no high risk bleeding sources.  Her hemoglobin dropped slightly from admission, but her BUN is not elevated and she has not had any further evidence of bleeding.  I suspect this mild drop in hemoglobin is likely dilutional. Patient is  feeling well today without any nausea or abdominal pain.  She would like to advance diet. Low concern for high risk of bleeding in this patient.  Okay with discharge home today.  Coffee-ground emesis/questionable upper GI bleed - Advance to full liquid diet this morning - Tentatively plan for discharge if tolerating full liquids - Recommend twice daily PPI for 4 weeks, then transitioning to once daily - Recommend indefinite avoidance of NSAIDs, which was the likely precipitant of her presentation. - No plans for endoscopic examination    [name], MD  06/12/2023, 8:47 AM [facility]

## 2023-06-12 NOTE — Care Management Obs Status (Signed)
 MEDICARE OBSERVATION STATUS NOTIFICATION   Patient Details  Name: Tammy [name] MRN: [id number] Date of Birth: Oct 29, 1955   Medicare Observation Status Notification Given:  Yes    Gertha Ku, LCSW 06/12/2023, 4:26 PM

## 2023-06-12 NOTE — Progress Notes (Signed)
 Mobility Specialist - Progress Note   06/12/23 1008  Mobility  Activity Transferred from bed to chair  Level of Assistance Standby assist, set-up cues, supervision of patient - no hands on  Assistive Device None  Range of Motion/Exercises Active  Activity Response Tolerated well  Mobility Referral Yes  Mobility visit 1 Mobility  Mobility Specialist Start Time (ACUTE ONLY) [id number]  Mobility Specialist Stop Time (ACUTE ONLY) 1008  Mobility Specialist Time Calculation (min) (ACUTE ONLY) 10 min   Pt was found in bed and wanting to transfer to recliner chair. Supervised transfer to recliner chair and was left with all needs met. Call bell in reach. Pt requesting pain meds and RN notified.  Lorna Rose Mobility Specialist

## 2023-06-13 NOTE — Discharge Summary (Signed)
 Physician Discharge Summary   Patient: Tammy [name] MRN: [id number] DOB: 09/27/1955  Admit date:     06/11/2023  Discharge date: 06/12/2023  Discharge Physician: True Fuss   PCP: Wright Heal, MD   Recommendations at discharge:   Please take all prescribed medications exactly as instructed including taking Protonix  40mg  by mouth twice daily for one month followed by taking it once daily.  Please consume a low sodium diet Please increase your physical activity as tolerated using an assistive device. Please maintain all outpatient follow-up appointments including follow-up with your primary care provider. Please return to the emergency department if you develop recurrent black stools, bloody vomiting, abdominal pain or fever.   Discharge Diagnoses: Principal Problem:   Melena Active Problems:   Essential hypertension   Hypokalemia   Chronic hepatitis C with cirrhosis (HCC)   Chronic pain syndrome   S/P AKA (above knee amputation) unilateral, left (HCC)   GERD (gastroesophageal reflux disease)   Hypercalcemia   ABLA (acute blood loss anemia)   Acute upper GI bleed  Resolved Problems:   * No resolved hospital problems. *   Hospital Course: 68 y.o. 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 Julio Ohm (circumstances surrounding this are summarized below  in italics), left femoral vein DVT diagnosed 11/14/2022 (eliquis  discontinued 02/2023), anemia, anxiety, bipolar disorder, left breast cancer, depression, GERD, hypertension, history of CVA with residual left-sided hemiparesis (2014), history of hep C liver cirrhosis who presented to [facility] with complaints of epigastric abdominal pain melena and hematemesis.    Upon evaluation in the emergency department fecal occult blood was positive.  Hemoglobin was stable at 11.9.  Patient was initiated on intravenous Protonix  and intravenous fluids.  The hospitalist group was called to assess the patient for  admission the hospital.  Gastroenterology was consulted.  It was identified that patient had been taking ibuprofen  as of late which was thought to be the culprit.  Patient was advised to stop using this medication immediately.  GI reviewed the patient's EGD from 02/2023 and felt that a repeat EGD was not necessary at this time.    Patient was managed conservatively and monitored closely throughout the hospitalization with no further evidence of bleeding.  Hemoglobin did drop slightly but this was thought to be due to hemodilution.  Over the course of the patient's observation, patient clinically felt improved and diet was advanced successfully.  Patient was discharged home on 5/3 in improved and stable condition on a 4-week course of twice daily Protonix  followed by 40 mg by mouth once daily.    Pain control - [place]  Controlled Substance Reporting System database was reviewed. and patient was instructed, not to drive, operate heavy machinery, perform activities at heights, swimming or participation in water  activities or provide baby-sitting services while on Pain, Sleep and Anxiety Medications; until their outpatient Physician has advised to do so again. Also recommended to not to take more than prescribed Pain, Sleep and Anxiety Medications.   Consultants: Dr. Elvin Hammer with Gastroenterology Procedures performed: none  Disposition: Home Diet recommendation:  Discharge Diet Orders (From admission, onward)     Start     Ordered   06/12/23 0000  Diet - low sodium heart healthy        06/12/23 1514           Cardiac diet  DISCHARGE MEDICATION: Allergies as of 06/12/2023       Reactions   Lisinopril Cough   Pregabalin   Other (See Comments)   Sedation         Medication List     STOP taking these medications    amLODipine  10 MG tablet Commonly known as: NORVASC    hydrALAZINE  25 MG tablet Commonly known as: APRESOLINE    meloxicam  15 MG tablet Commonly known as: MOBIC         TAKE these medications    anastrozole  1 MG tablet Commonly known as: ARIMIDEX  Take 1 tablet (1 mg total) by mouth daily.   buPROPion  150 MG 12 hr tablet Commonly known as: WELLBUTRIN  SR Take 150 mg by mouth 2 (two) times daily.   calcium  carbonate 1250 (500 Ca) MG tablet Commonly known as: OS-CAL - dosed in mg of elemental calcium  Take 1 tablet by mouth daily.   DULoxetine  30 MG capsule Commonly known as: Cymbalta  Take 1 capsule (30 mg total) by mouth daily.   gabapentin  300 MG capsule Commonly known as: NEURONTIN  Take 1 capsule (300 mg total) by mouth at bedtime.   HYDROcodone -acetaminophen  10-325 MG tablet Commonly known as: NORCO Take 1 tablet by mouth See admin instructions. Take 1 tablet by mouth four times a day and an additional 1 tablet up to two times a day as needed for pain   lidocaine  5 % Commonly known as: LIDODERM  Place 1 patch onto the skin daily. Apply to painful area of left leg stump   losartan  50 MG tablet Commonly known as: COZAAR  Take 1 tablet (50 mg total) by mouth daily.   pantoprazole  40 MG tablet Commonly known as: PROTONIX  Take 1 tablet (40 mg total) by mouth 2 (two) times daily. What changed: when to take this   potassium chloride  10 MEQ tablet Commonly known as: KLOR-CON  Take 10 mEq by mouth daily.   PRENATAL PO Take 1 tablet by mouth daily with breakfast.   promethazine  25 MG tablet Commonly known as: PHENERGAN  Take 1 tablet (25 mg total) by mouth every 6 (six) hours as needed for nausea or vomiting.   VITAMIN B-12 PO Take 2,000 mcg by mouth daily.   vitamin C  1000 MG tablet Take 1,000 mg by mouth daily.   Voltaren 1 % Gel Generic drug: diclofenac Sodium Apply 2-4 g topically 4 (four) times daily as needed (for pain- right knee or other affected area).   zinc  gluconate 50 MG tablet Take 50 mg by mouth daily.        Follow-up Information     Wright Heal, MD. Schedule an appointment as soon as possible for a  visit in 1 week(s).   Specialty: Internal Medicine Contact information: [address] [place] [place] [address] [phone]                 Discharge Exam: Cleavon Curls Weights   06/11/23 2243  Weight: 64 kg    Constitutional: Awake alert and oriented x3, no associated distress.   Respiratory: clear to auscultation bilaterally, no wheezing, no crackles. Normal respiratory effort. No accessory muscle use.  Cardiovascular: Regular rate and rhythm, no murmurs / rubs / gallops. No extremity edema. 2+ pedal pulses. No carotid bruits.  Abdomen: Abdomen is soft and nontender.  No evidence of intra-abdominal masses.  Positive bowel sounds noted in all quadrants.   Musculoskeletal: Left AKA  Condition at discharge: fair  The results of significant diagnostics from this hospitalization (including imaging, microbiology, ancillary and laboratory) are listed below for reference.   Imaging Studies: No results found.  Microbiology: Results for orders placed or performed during the  hospital encounter of 11/04/22  Aerobic/Anaerobic Culture w Gram Stain (surgical/deep wound)     Status: None   Collection Time: 11/04/22 10:24 AM   Specimen: PATH Amputaion Arm/Leg; Tissue  Result Value Ref Range Status   Specimen Description TISSUE AMP ARM LEG LEFT  Final   Special Requests A  Final   Gram Stain NO WBC SEEN NO ORGANISMS SEEN   Final   Culture   Final    No growth aerobically or anaerobically. Performed at [facility], [address] [place], [place] [address]    Report Status 11/09/2022 FINAL  Final  Aerobic/Anaerobic Culture w Gram Stain (surgical/deep wound)     Status: None   Collection Time: 11/04/22 10:32 AM   Specimen: Bone; Tissue  Result Value Ref Range Status   Specimen Description BONE LEFT LEG ABOVE KNEE  Final   Special Requests B  Final   Gram Stain NO WBC SEEN NO ORGANISMS SEEN   Final   Culture   Final    No growth aerobically or anaerobically. Performed  at [facility], [address], [place], [place] [address]    Report Status 11/09/2022 FINAL  Final  Surgical pcr screen     Status: None   Collection Time: 11/04/22  5:15 PM   Specimen: Nasal Mucosa; Nasal Swab  Result Value Ref Range Status   MRSA, PCR NEGATIVE NEGATIVE Final   Staphylococcus aureus NEGATIVE NEGATIVE Final    Comment: (NOTE) The Xpert SA Assay (FDA approved for NASAL specimens in patients 31 years of age and older), is one component of a comprehensive surveillance program. It is not intended to diagnose infection nor to guide or monitor treatment. Performed at [facility], [address] [place], [place] [address]     Labs: CBC: Recent Labs  Lab 06/11/23 1108 06/11/23 1423 06/11/23 2033 06/12/23 0409  WBC 5.0  --   --  5.4  HGB 11.9* 11.4* 10.2* 10.2*  HCT 37.4 36.1 32.6* 32.4*  MCV 87.8  --   --  89.5  PLT 308  --   --  242   Basic Metabolic Panel: Recent Labs  Lab 06/11/23 1108 06/11/23 1558 06/12/23 0409  NA 136  --  137  K 3.1*  --  3.2*  CL 97*  --  101  CO2 28  --  26  GLUCOSE 115*  --  91  BUN 10  --  11  CREATININE 0.67  --  0.57  CALCIUM  11.7*  --  10.0  MG  --  1.7  --   PHOS  --  3.1  --    Liver Function Tests: Recent Labs  Lab 06/11/23 1108 06/12/23 0409  AST 18 16  ALT 16 12  ALKPHOS 78 68  BILITOT 0.2 0.4  PROT 8.0 6.9  ALBUMIN 4.2 3.5   CBG: No results for input(s): "GLUCAP" in the last 168 hours.  Discharge time spent: greater than 30 minutes.  Signed: True Fuss, MD [facility] 06/13/2023

## 2023-06-14 ENCOUNTER — Encounter: Admitting: Physical Therapy

## 2023-06-24 ENCOUNTER — Ambulatory Visit: Payer: Medicare Other | Admitting: Orthopedic Surgery

## 2023-06-24 DIAGNOSIS — Z89612 Acquired absence of left leg above knee: Secondary | ICD-10-CM | POA: Diagnosis not present

## 2023-06-25 ENCOUNTER — Encounter: Payer: Self-pay | Admitting: Orthopedic Surgery

## 2023-06-25 NOTE — Progress Notes (Signed)
 Office Visit Note   Patient: Tammy [name]           Date of Birth: 05-21-1955           MRN: [id number] Visit Date: 06/24/2023              Requested by: Wright Heal, MD [address] [place],  [place] [address] PCP: Wright Heal, MD  Chief Complaint  Patient presents with   Left Leg - Follow-up    Hx left AKA      HPI: Patient is a 68 year old woman who is seen in follow-up for left above-knee amputation.  Patient states she is having unusual sensations in her leg.  She did not wear her prosthesis today.  She is currently in physical therapy with Robin.  Assessment & Plan: Visit Diagnoses:  1. Hx of AKA (above knee amputation), left (HCC)     Plan: Recommended wearing her shrinker around-the-clock to help decrease the lymphedema.  Follow-Up Instructions: Return if symptoms worsen or fail to improve.   Ortho Exam  Patient is alert, oriented, no adenopathy, well-dressed, normal affect, normal respiratory effort. Examination patient has no open ulcers no cellulitis.  She does have increased swelling with lymphedema.  Imaging: No results found. No images are attached to the encounter.  Labs: Lab Results  Component Value Date   HGBA1C 5.6 03/17/2022   HGBA1C 5.6 12/01/2018   HGBA1C 5.9 (H) 05/11/2015   ESRSEDRATE 38 (H) 03/17/2022   CRP 9.6 (H) 11/03/2022   CRP 68.5 (H) 09/22/2022   CRP 47.9 (H) 08/25/2022   REPTSTATUS 11/09/2022 FINAL 11/04/2022   GRAMSTAIN NO WBC SEEN NO ORGANISMS SEEN  11/04/2022   CULT  11/04/2022    No growth aerobically or anaerobically. Performed at [facility], [address], [place], [place] [address]    [facility] ENTEROCOCCUS FAECALIS 06/07/2022     Lab Results  Component Value Date   ALBUMIN 3.5 06/12/2023   ALBUMIN 4.2 06/11/2023   ALBUMIN 3.2 (L) 02/21/2023    Lab Results  Component Value Date   MG 1.7 06/11/2023   MG 2.0 02/21/2023   MG 1.7 02/20/2023   Lab Results  Component Value Date    VD25OH 10.88 (L) 04/11/2021    No results found for: "PREALBUMIN"    Latest Ref Rng & Units 06/12/2023    4:09 AM 06/11/2023    8:33 PM 06/11/2023    2:23 PM  CBC EXTENDED  WBC 4.0 - 10.5 K/uL 5.4     RBC 3.87 - 5.11 MIL/uL 3.62     Hemoglobin 12.0 - 15.0 g/dL 82.9  56.2  13.0   HCT 36.0 - 46.0 % 32.4  32.6  36.1   Platelets 150 - 400 K/uL 242        There is no height or weight on file to calculate BMI.  Orders:  No orders of the defined types were placed in this encounter.  No orders of the defined types were placed in this encounter.    Procedures: No procedures performed  Clinical Data: No additional findings.  ROS:  All other systems negative, except as noted in the HPI. Review of Systems  Objective: Vital Signs: There were no vitals taken for this visit.  Specialty Comments:  No specialty comments available.  PMFS History: Patient Active Problem List   Diagnosis Date Noted   Acute upper GI bleed 06/12/2023   Melena 06/11/2023   Hypercalcemia 06/11/2023   ABLA (acute blood  loss anemia) 06/11/2023   Left femoral vein DVT (HCC) 02/20/2023   GERD (gastroesophageal reflux disease) 02/20/2023   Acute upper gastrointestinal bleeding 02/19/2023   Bipolar 1 disorder, depressed, partial remission (HCC) 11/18/2022   Left above-knee amputee (HCC) 11/13/2022   Infection of total knee replacement (HCC) 11/04/2022   S/P AKA (above knee amputation) unilateral, left (HCC) 11/04/2022   Chronic hepatitis C with cirrhosis (HCC) 06/06/2022   Surgical wound dehiscence, initial encounter 06/04/2022   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, left, with nonunion, subsequent encounter 04/30/2022   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 04/30/2022   Hypokalemia 04/16/2021   Hip fracture requiring operative repair (HCC) 04/16/2021   Vitamin D  deficiency 04/12/2021   Hyponatremia 04/11/2021   Acute postoperative  anemia due to expected blood loss 04/11/2021   Class 1 obesity 04/11/2021   Left comminuted and displaced fracture of the distal femur metaphysis 04/08/2021   History of CVA (cerebrovascular accident) 04/08/2021   Other cirrhosis of liver (HCC) 01/01/2021   Genetic testing 07/17/2020   Family history of breast cancer 07/10/2020   Family history of ovarian cancer 07/10/2020   Family history of colon cancer 07/10/2020   Malignant neoplasm of upper-outer quadrant of left breast in female, estrogen receptor positive (HCC) 07/09/2020   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   Chronic pain syndrome 10/06/2012   Nicotine  dependence 10/06/2012   Anxiety state 10/05/2012   Bipolar disorder, unspecified (HCC) 10/05/2012   Slurred speech 10/04/2012   Left-sided weakness 10/04/2012   Hyperkalemia 10/04/2012   Acute CVA (cerebrovascular accident) (HCC) 10/04/2012   Essential hypertension    Past Medical History:  Diagnosis Date   Anemia    hx of   Anxiety    on meds   Arthritis    back/bilateral ankles   Bipolar disorder (HCC)    Breast cancer (HCC) 2022   LEFT breast-radiation   Depression    on meds   Family history of breast cancer 07/10/2020   Family history of colon cancer 07/10/2020   Family history of ovarian cancer 07/10/2020   GERD (gastroesophageal reflux disease)    on meds   Heart murmur    dx by PCP   Hepatitis C    HTN (hypertension)    on meds   Hx of radiation therapy    Liver cirrhosis (HCC)    from hep c   Neuromuscular disorder (HCC)    Osteopenia    Personal history of radiation therapy    Stroke (HCC) 09/2012   slurred speech and LEFT sided weakness/notes 10/04/2012    Family History  Problem Relation Age of Onset   Breast cancer Mother    Other Mother        blood clots   Diabetes Sister    Neuropathy Sister        diabetic neuropathy   Breast cancer Maternal Aunt        50   Colon cancer Maternal Aunt 69   Diabetes Paternal Aunt     Diabetes Brother    Neuropathy Brother        diabetic neuropathy   Breast cancer Other        PGM's sister; dx mid 57s   Colon cancer Other        PGM's brother; dx 9s   Ovarian cancer Other        PGM's mother; dx unknown age   Esophageal cancer Neg Hx    Rectal  cancer Neg Hx    Stomach cancer Neg Hx    Colon polyps Neg Hx     Past Surgical History:  Procedure Laterality Date   AMPUTATION Left 11/04/2022   Procedure: LEFT ABOVE KNEE AMPUTATION;  Surgeon: Timothy Ford, MD;  Location: [facility] OR;  Service: Orthopedics;  Laterality: Left;   ANKLE FRACTURE SURGERY Left 2018   APPLICATION OF WOUND VAC Left 06/15/2022   Procedure: APPLICATION OF WOUND VAC;  Surgeon: Thornell Flirt, DO;  Location: [facility]S;  Service: Plastics;  Laterality: Left;   APPLICATION OF WOUND VAC Left 09/14/2022   Procedure: APPLICATION OF WOUND VAC;  Surgeon: Thornell Flirt, DO;  Location: [facility] OR;  Service: Plastics;  Laterality: Left;   BREAST LUMPECTOMY Left 07/2020   BREAST LUMPECTOMY WITH RADIOACTIVE SEED AND SENTINEL LYMPH NODE BIOPSY Left 07/26/2020   Procedure: LEFT BREAST LUMPECTOMY WITH RADIOACTIVE SEED AND SENTINEL LYMPH NODE BIOPSY;  Surgeon: Oza Blumenthal, MD;  Location: [facility];  Service: General;  Laterality: Left;   CESAREAN SECTION  1986   COLONOSCOPY     DIAGNOSTIC LAPAROSCOPY  1985   ESOPHAGOGASTRODUODENOSCOPY N/A 02/21/2023   Procedure: ESOPHAGOGASTRODUODENOSCOPY (EGD);  Surgeon: Tobin Forts, MD;  Location: [facility];  Service: Gastroenterology;  Laterality: N/A;   HARDWARE REMOVAL Left 04/30/2022   Procedure: HARDWARE REMOVAL LEFT FEMUR;  Surgeon: Adonica Hoose, MD;  Location: [facility];  Service: Orthopedics;  Laterality: Left;  150   HARDWARE REMOVAL Left 11/04/2022   Procedure: HARDWARE REMOVAL INFECTED LEFT TOTAL KNEE ARTHROPLASTY;  Surgeon: Timothy Ford, MD;  Location: [facility] OR;  Service: Orthopedics;  Laterality: Left;   I & D KNEE WITH POLY EXCHANGE Left  06/07/2022   Procedure: IRRIGATION AND DEBRIDEMENT KNEE WITH POLY EXCHANGE;  Surgeon: Adonica Hoose, MD;  Location: [facility];  Service: Orthopedics;  Laterality: Left;   INCISION AND DRAINAGE OF WOUND Left 06/04/2022   Procedure: IRRIGATION AND DEBRIDEMENT KNEE;  Surgeon: Adonica Hoose, MD;  Location: [facility];  Service: Orthopedics;  Laterality: Left;  60   INCISION AND DRAINAGE OF WOUND Left 06/15/2022   Procedure: excision of knee wound with Myriad;  Surgeon: Thornell Flirt, DO;  Location: [facility];  Service: Plastics;  Laterality: Left;   ORIF FEMUR FRACTURE Left 04/10/2021   Procedure: OPEN REDUCTION INTERNAL FIXATION (ORIF) DISTAL FEMUR FRACTURE;  Surgeon: Hardy Lia, MD;  Location: [facility] OR;  Service: Orthopedics;  Laterality: Left;   PARTIAL KNEE ARTHROPLASTY Left 04/30/2022   Procedure: DISTAL FEMUR REPLACEMENT;  Surgeon: Adonica Hoose, MD;  Location: [facility];  Service: Orthopedics;  Laterality: Left;  150   Social History   Occupational History   Occupation: diabled  Tobacco Use   Smoking status: Former    Current packs/day: 1.50    Average packs/day: 1.5 packs/day for 40.0 years (60.0 ttl pk-yrs)    Types: Cigarettes   Smokeless tobacco: Never   Tobacco comments:    has used e-cigarettes, no vaping   Vaping Use   Vaping status: Never Used  Substance and Sexual Activity   Alcohol  use: Not Currently   Drug use: No   Sexual activity: Not Currently

## 2023-07-02 ENCOUNTER — Telehealth: Payer: Self-pay | Admitting: Hematology

## 2023-07-06 ENCOUNTER — Encounter: Admitting: Physical Therapy

## 2023-07-06 NOTE — Assessment & Plan Note (Deleted)
-  T1bN0M0, Stage IA, G1 ductal carcinoma, ER+/PR+/HER2-,  diagnosed in 06/2020 -S/P lumpectomy and sentinel lymph node biopsy in June 2022 -Oncotype recurrence score 16 -She received adjuvant radiation -She has been on adjuvant anastrozole since September 2022.  She is tolerating well, plan for 5-7 years -Patient is clinically doing well, exam unremarkable, lab reviewed, there is no clinical concern for recurrence. -Will continue cancer surveillance

## 2023-07-07 ENCOUNTER — Telehealth: Payer: Self-pay | Admitting: Hematology

## 2023-07-07 ENCOUNTER — Inpatient Hospital Stay: Admitting: Hematology

## 2023-07-07 ENCOUNTER — Telehealth: Payer: Self-pay

## 2023-07-07 DIAGNOSIS — Z170 Estrogen receptor positive status [ER+]: Secondary | ICD-10-CM

## 2023-07-07 NOTE — Telephone Encounter (Signed)
 Patient did not arrive to her 10:00am office visit.  Attempted to contact the patient via telephone call.  Unable to reach the patient. Left voicemail to contact the facility.

## 2023-07-09 ENCOUNTER — Inpatient Hospital Stay: Attending: Hematology | Admitting: Hematology

## 2023-07-09 VITALS — BP 160/82 | HR 67 | Temp 97.7°F | Resp 15 | Ht 62.0 in | Wt 144.0 lb

## 2023-07-09 DIAGNOSIS — Z89612 Acquired absence of left leg above knee: Secondary | ICD-10-CM | POA: Insufficient documentation

## 2023-07-09 DIAGNOSIS — C50412 Malignant neoplasm of upper-outer quadrant of left female breast: Secondary | ICD-10-CM | POA: Diagnosis not present

## 2023-07-09 DIAGNOSIS — Z1231 Encounter for screening mammogram for malignant neoplasm of breast: Secondary | ICD-10-CM

## 2023-07-09 DIAGNOSIS — Z170 Estrogen receptor positive status [ER+]: Secondary | ICD-10-CM | POA: Insufficient documentation

## 2023-07-09 DIAGNOSIS — Z1732 Human epidermal growth factor receptor 2 negative status: Secondary | ICD-10-CM | POA: Diagnosis not present

## 2023-07-09 DIAGNOSIS — E2839 Other primary ovarian failure: Secondary | ICD-10-CM

## 2023-07-09 DIAGNOSIS — M8580 Other specified disorders of bone density and structure, unspecified site: Secondary | ICD-10-CM | POA: Diagnosis not present

## 2023-07-09 DIAGNOSIS — Z803 Family history of malignant neoplasm of breast: Secondary | ICD-10-CM | POA: Insufficient documentation

## 2023-07-09 DIAGNOSIS — Z1721 Progesterone receptor positive status: Secondary | ICD-10-CM | POA: Diagnosis not present

## 2023-07-09 DIAGNOSIS — Z800 Family history of malignant neoplasm of digestive organs: Secondary | ICD-10-CM | POA: Insufficient documentation

## 2023-07-09 DIAGNOSIS — Z8041 Family history of malignant neoplasm of ovary: Secondary | ICD-10-CM | POA: Diagnosis not present

## 2023-07-09 DIAGNOSIS — Z79811 Long term (current) use of aromatase inhibitors: Secondary | ICD-10-CM | POA: Diagnosis not present

## 2023-07-09 DIAGNOSIS — R232 Flushing: Secondary | ICD-10-CM | POA: Insufficient documentation

## 2023-07-09 DIAGNOSIS — Z923 Personal history of irradiation: Secondary | ICD-10-CM | POA: Diagnosis not present

## 2023-07-09 DIAGNOSIS — N951 Menopausal and female climacteric states: Secondary | ICD-10-CM | POA: Diagnosis not present

## 2023-07-09 MED ORDER — GABAPENTIN 100 MG PO CAPS: 100.0000 mg | ORAL_CAPSULE | Freq: Every day | ORAL | 0 refills | Status: DC

## 2023-07-09 MED ORDER — ANASTROZOLE 1 MG PO TABS: 1.0000 mg | ORAL_TABLET | Freq: Every day | ORAL | 3 refills | Status: DC

## 2023-07-09 NOTE — Assessment & Plan Note (Signed)
-  T1bN0M0, Stage IA, G1 ductal carcinoma, ER+/PR+/HER2-,  diagnosed in 06/2020 -S/P lumpectomy and sentinel lymph node biopsy in June 2022 -Oncotype recurrence score 16 -She received adjuvant radiation -She has been on adjuvant anastrozole since September 2022.  She is tolerating well, plan for 5-7 years -Patient is clinically doing well, exam unremarkable, lab reviewed, there is no clinical concern for recurrence. -Will continue cancer surveillance

## 2023-07-09 NOTE — Progress Notes (Signed)
 [facility]   [phone]) [phone] [phone]   Clinic Follow up Note   Patient Care Team: Wright Heal, MD as PCP - General (Internal Medicine) Oza Blumenthal, MD as Consulting Physician (General Surgery) Johna Myers, MD as Consulting Physician (Radiation Oncology) Glory Larsen, MD as Consulting Physician (Neurology) Debbie Fails, Laura Polio, NP as Nurse Practitioner (Hematology and Oncology) Sonja [place], MD as Consulting Physician (Hematology)  Date of Service:  07/09/2023  CHIEF COMPLAINT: f/u of breast cancer   CURRENT THERAPY:  Anastrozole    Oncology History   Malignant neoplasm of upper-outer quadrant of left breast in female, estrogen receptor positive (HCC) -T1bN0M0, Stage IA, G1 ductal carcinoma, ER+/PR+/HER2-,  diagnosed in 06/2020 -S/P lumpectomy and sentinel lymph node biopsy in June 2022 -Oncotype recurrence score 16 -She received adjuvant radiation -She has been on adjuvant anastrozole  since September 2022.  She is tolerating well, plan for 5-7 years -Patient is clinically doing well, exam unremarkable, lab reviewed, there is no clinical concern for recurrence. -Will continue cancer surveillance  Assessment & Plan Breast cancer Breast cancer status post-surgery in September 2024. Currently on anastrozole  therapy with good tolerance except for hot flashes. No new lumps or significant findings on physical exam. Due for a screening mammogram in September 2025. - Continue anastrozole  therapy for 5 to 7 years. - Order screening mammogram in September 2025. - Refill anastrozole  prescription through [facility].  Hot flashes due to medication Hot flashes attributed to anastrozole  therapy. Managed with gabapentin  300 mg at night. Hot flashes more prominent at night, with some during the day. No morning drowsiness reported. Plan to titrate gabapentin  dose before considering alternative medications. - Prescribe additional gabapentin   100 mg capsules. - Instruct to add one capsule at night, and increase by one capsule per week up to three capsules if needed. - Advise to contact if a 600 mg dose is required.  Osteopenia Osteopenia with previous bone density scan in May 2023. Anastrozole  may impact bone density, necessitating close monitoring. - Order bone density scan in September 2025, ideally on the same day as the mammogram at the breast center.  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 Currently undergoing physical therapy and working on ambulation with a prosthetic leg. No pain or discomfort reported.  Plan -she is clinically doing well, no concerns for cancer recurrence -continue anastrozole , refilled  -MM and DEXA in 10/2023   SUMMARY OF ONCOLOGIC HISTORY: Oncology History Overview Note   Cancer Staging  Malignant neoplasm of upper-outer quadrant of left breast in female, estrogen receptor positive (HCC) Staging form: Breast, AJCC 8th Edition - Clinical stage from 07/10/2020: Stage IA (cT1b, cN0, cM0, G1, ER+, PR+, HER2-) - Signed by Willy Harvest, MD on 07/10/2020 Stage prefix: Initial diagnosis Histologic grading system: 3 grade system - Pathologic: Stage IA (pT1b, pN0, cM0, G1, ER+, PR+, HER2-) - Signed by Percival Brace, NP on 12/04/2020 Histologic grading system: 3 grade system     Malignant neoplasm of upper-outer quadrant of left breast in female, estrogen receptor positive (HCC)  07/02/2020 Initial Diagnosis   status post left breast upper outer quadrant biopsy 07/02/2020 for a clinical T1b N0, stage IA invasive ductal carcinoma, grade 1, estrogen and progesterone receptor positive, HER2 not amplified, with an MIB-1 of 5%   07/10/2020 Cancer Staging   Staging form: Breast, AJCC 8th Edition - Clinical stage from 07/10/2020: Stage IA (cT1b, cN0, cM0, G1, ER+, PR+, HER2-) -  Signed by Willy Harvest, MD on  07/10/2020 Stage prefix: Initial diagnosis Histologic grading system: 3 grade system   07/17/2020 Genetic Testing   Negative hereditary cancer genetic testing: no pathogenic variants detected in [facility] BRCAPlus Panel or [facility] CancerNext-Expanded +RNAinsight.  The report dates are July 17, 2020 and July 22, 2020, respectively.   The BRCAplus panel offered by [employer] and includes sequencing and deletion/duplication analysis for the following 8 genes: ATM, BRCA1, BRCA2, CDH1, CHEK2, PALB2, PTEN, and TP53.  The CancerNext-Expanded gene panel offered by [facility] and includes sequencing, rearrangement, and RNA analysis for the following 77 genes: AIP, ALK, APC, ATM, AXIN2, BAP1, BARD1, BLM, BMPR1A, BRCA1, BRCA2, BRIP1, CDC73, CDH1, CDK4, CDKN1B, CDKN2A, CHEK2, CTNNA1, DICER1, FANCC, FH, FLCN, GALNT12, KIF1B, LZTR1, MAX, MEN1, MET, MLH1, MSH2, MSH3, MSH6, MUTYH, NBN, NF1, NF2, NTHL1, PALB2, PHOX2B, PMS2, POT1, PRKAR1A, PTCH1, PTEN, RAD51C, RAD51D, RB1, RECQL, RET, SDHA, SDHAF2, SDHB, SDHC, SDHD, SMAD4, SMARCA4, SMARCB1, SMARCE1, STK11, SUFU, TMEM127, TP53, TSC1, TSC2, VHL and XRCC2 (sequencing and deletion/duplication); EGFR, EGLN1, HOXB13, KIT, MITF, PDGFRA, POLD1, and POLE (sequencing only); EPCAM and GREM1 (deletion/duplication only).    07/26/2020 Surgery   left lumpectomy and sentinel lymph node sampling 07/26/2020 showed a pT1b pN0, stage IA invasive ductal carcinoma, grade 1, with negative margins.             (A) a total of 3 left axilla lymph nodes were removed   07/26/2020 Oncotype testing   Oncotype score of 16 predicts a risk of recurrence outside the breast within the next 9 years of 4% if the patient's only systemic therapy is an antiestrogen for 5 years.  It also predicts no benefit from chemotherapy   08/28/2020 - 09/25/2020 Radiation Therapy   adjuvant radiation 08/28/2020 through 09/25/2020 Site Technique Total Dose (Gy) Dose per Fx (Gy) Completed Fx Beam Energies  Breast, Left:  Breast_Lt 3D 42.56/42.56 2.66 16/16 6XFFF, 10XFFF  Breast, Left: Breast_Lt_Bst 3D 8/8 2 4/4 6X, 10X     10/21/2020 -  Anti-estrogen oral therapy   Anastrozole     12/04/2020 Cancer Staging   Staging form: Breast, AJCC 8th Edition - Pathologic: Stage IA (pT1b, pN0, cM0, G1, ER+, PR+, HER2-) - Signed by Percival Brace, NP on 12/04/2020 Histologic grading system: 3 grade system      Discussed the use of AI scribe software for clinical note transcription with the patient, who gave verbal consent to proceed.  History of Present Illness Saddie Jadee Golebiewski is a 68 year old female with breast cancer who presents for follow-up. She is accompanied by Royston Cornea, her fianc.  She underwent breast surgery in September 2024, complicated by an infection leading to a lower limb amputation. She is currently undergoing physical therapy and is working on walking with a prosthetic leg. No pain or discomfort is present at the amputation site.  She is on anastrozole  for breast cancer treatment and requires a prescription refill. She experiences hot flashes, particularly at night, despite taking 300 mg of gabapentin . There is no morning drowsiness.  In January 2025, she experienced significant anemia requiring hospitalization due to bleeding. An endoscopy was performed, but no source of bleeding was identified. Recent blood tests show near resolution of anemia.  She is due for a mammogram and a bone density scan in September 2025. She has osteopenia. She resides in [place].     All other systems were reviewed with the patient and are negative.  MEDICAL HISTORY:  Past Medical History:  Diagnosis Date  Anemia    hx of   Anxiety    on meds   Arthritis    back/bilateral ankles   Bipolar disorder (HCC)    Breast cancer (HCC) 2022   LEFT breast-radiation   Depression    on meds   Family history of breast cancer 07/10/2020   Family history of colon cancer 07/10/2020   Family history  of ovarian cancer 07/10/2020   GERD (gastroesophageal reflux disease)    on meds   Heart murmur    dx by PCP   Hepatitis C    HTN (hypertension)    on meds   Hx of radiation therapy    Liver cirrhosis (HCC)    from hep c   Neuromuscular disorder (HCC)    Osteopenia    Personal history of radiation therapy    Stroke (HCC) 09/2012   slurred speech and LEFT sided weakness/notes 10/04/2012    SURGICAL HISTORY: Past Surgical History:  Procedure Laterality Date   AMPUTATION Left 11/04/2022   Procedure: LEFT ABOVE KNEE AMPUTATION;  Surgeon: Timothy Ford, MD;  Location: [facility] OR;  Service: Orthopedics;  Laterality: Left;   ANKLE FRACTURE SURGERY Left 2018   APPLICATION OF WOUND VAC Left 06/15/2022   Procedure: APPLICATION OF WOUND VAC;  Surgeon: Thornell Flirt, DO;  Location: [facility];  Service: Plastics;  Laterality: Left;   APPLICATION OF WOUND VAC Left 09/14/2022   Procedure: APPLICATION OF WOUND VAC;  Surgeon: Thornell Flirt, DO;  Location: [facility] OR;  Service: Plastics;  Laterality: Left;   BREAST LUMPECTOMY Left 07/2020   BREAST LUMPECTOMY WITH RADIOACTIVE SEED AND SENTINEL LYMPH NODE BIOPSY Left 07/26/2020   Procedure: LEFT BREAST LUMPECTOMY WITH RADIOACTIVE SEED AND SENTINEL LYMPH NODE BIOPSY;  Surgeon: Oza Blumenthal, MD;  Location: [facility];  Service: General;  Laterality: Left;   CESAREAN SECTION  1986   COLONOSCOPY     DIAGNOSTIC LAPAROSCOPY  1985   ESOPHAGOGASTRODUODENOSCOPY N/A 02/21/2023   Procedure: ESOPHAGOGASTRODUODENOSCOPY (EGD);  Surgeon: Tobin Forts, MD;  Location: [facility];  Service: Gastroenterology;  Laterality: N/A;   HARDWARE REMOVAL Left 04/30/2022   Procedure: HARDWARE REMOVAL LEFT FEMUR;  Surgeon: Adonica Hoose, MD;  Location: [facility];  Service: Orthopedics;  Laterality: Left;  150   HARDWARE REMOVAL Left 11/04/2022   Procedure: HARDWARE REMOVAL INFECTED LEFT TOTAL KNEE ARTHROPLASTY;  Surgeon: Timothy Ford, MD;  Location: [facility]  OR;  Service: Orthopedics;  Laterality: Left;   I & D KNEE WITH POLY EXCHANGE Left 06/07/2022   Procedure: IRRIGATION AND DEBRIDEMENT KNEE WITH POLY EXCHANGE;  Surgeon: Adonica Hoose, MD;  Location: [facility];  Service: Orthopedics;  Laterality: Left;   INCISION AND DRAINAGE OF WOUND Left 06/04/2022   Procedure: IRRIGATION AND DEBRIDEMENT KNEE;  Surgeon: Adonica Hoose, MD;  Location: [facility];  Service: Orthopedics;  Laterality: Left;  60   INCISION AND DRAINAGE OF WOUND Left 06/15/2022   Procedure: excision of knee wound with Myriad;  Surgeon: Thornell Flirt, DO;  Location: [facility];  Service: Plastics;  Laterality: Left;   ORIF FEMUR FRACTURE Left 04/10/2021   Procedure: OPEN REDUCTION INTERNAL FIXATION (ORIF) DISTAL FEMUR FRACTURE;  Surgeon: Hardy Lia, MD;  Location: [facility] OR;  Service: Orthopedics;  Laterality: Left;   PARTIAL KNEE ARTHROPLASTY Left 04/30/2022   Procedure: DISTAL FEMUR REPLACEMENT;  Surgeon: Adonica Hoose, MD;  Location: [facility];  Service: Orthopedics;  Laterality: Left;  150    I have reviewed the social history and family history  with the patient and they are unchanged from previous note.  ALLERGIES:  is allergic to lisinopril and pregabalin .  MEDICATIONS:  Current Outpatient Medications  Medication Sig Dispense Refill   Ascorbic Acid  (VITAMIN C ) 1000 MG tablet Take 1,000 mg by mouth daily.     buPROPion  (WELLBUTRIN  SR) 150 MG 12 hr tablet Take 150 mg by mouth 2 (two) times daily.     calcium  carbonate (OS-CAL - DOSED IN MG OF ELEMENTAL CALCIUM ) 1250 (500 Ca) MG tablet Take 1 tablet by mouth daily.     Cyanocobalamin  (VITAMIN B-12 PO) Take 2,000 mcg by mouth daily.     DULoxetine  (CYMBALTA ) 30 MG capsule Take 1 capsule (30 mg total) by mouth daily. 30 capsule 3   gabapentin  (NEURONTIN ) 100 MG capsule Take 1 capsule (100 mg total) by mouth at bedtime. Take in addition to 300mg  dose. OK to increase by 1 capsule per week until 3 cap at night for hot flushes. 90  capsule 0   gabapentin  (NEURONTIN ) 300 MG capsule Take 1 capsule (300 mg total) by mouth at bedtime. 30 capsule 1   HYDROcodone -acetaminophen  (NORCO) 10-325 MG tablet Take 1 tablet by mouth See admin instructions. Take 1 tablet by mouth four times a day and an additional 1 tablet up to two times a day as needed for pain     lidocaine  (LIDODERM ) 5 % Place 1 patch onto the skin daily. Apply to painful area of left leg stump 30 patch 2   losartan  (COZAAR ) 50 MG tablet Take 1 tablet (50 mg total) by mouth daily. 30 tablet 2   pantoprazole  (PROTONIX ) 40 MG tablet Take 1 tablet (40 mg total) by mouth 2 (two) times daily. 60 tablet 0   potassium chloride  (KLOR-CON ) 10 MEQ tablet Take 10 mEq by mouth daily.     Prenatal Vit-Fe Fumarate-FA (PRENATAL PO) Take 1 tablet by mouth daily with breakfast.     promethazine  (PHENERGAN ) 25 MG tablet Take 1 tablet (25 mg total) by mouth every 6 (six) hours as needed for nausea or vomiting. 15 tablet 0   VOLTAREN 1 % GEL Apply 2-4 g topically 4 (four) times daily as needed (for pain- right knee or other affected area).     zinc  gluconate 50 MG tablet Take 50 mg by mouth daily.     anastrozole  (ARIMIDEX ) 1 MG tablet Take 1 tablet (1 mg total) by mouth daily. 90 tablet 3   No current facility-administered medications for this visit.    PHYSICAL EXAMINATION: ECOG PERFORMANCE STATUS: 1 - Symptomatic but completely ambulatory  Vitals:   07/09/23 1023  BP: (!) 160/82  Pulse: 67  Resp: 15  Temp: 97.7 F (36.5 C)  SpO2: 98%   Wt Readings from Last 3 Encounters:  07/09/23 144 lb (65.3 kg)  06/11/23 141 lb (64 kg)  04/08/23 153 lb (69.4 kg)     GENERAL:alert, no distress and comfortable SKIN: skin color, texture, turgor are normal, no rashes or significant lesions EYES: normal, Conjunctiva are pink and non-injected, sclera clear NECK: supple, thyroid normal size, non-tender, without nodularity LYMPH:  no palpable lymphadenopathy in the cervical, axillary   LUNGS: clear to auscultation and percussion with normal breathing effort HEART: regular rate & rhythm and no murmurs and no lower extremity edema ABDOMEN:abdomen soft, non-tender and normal bowel sounds Musculoskeletal:no cyanosis of digits and no clubbing  NEURO: alert & oriented x 3 with fluent speech, no focal motor/sensory deficits BREAST: Left breast with mild scar tissue post-surgery. Right breast  soft, normal. Skin under breast slightly moist. Physical Exam   LABORATORY DATA:  I have reviewed the data as listed    Latest Ref Rng & Units 06/12/2023    4:09 AM 06/11/2023    8:33 PM 06/11/2023    2:23 PM  CBC  WBC 4.0 - 10.5 K/uL 5.4     Hemoglobin 12.0 - 15.0 g/dL 16.1  09.6  04.5   Hematocrit 36.0 - 46.0 % 32.4  32.6  36.1   Platelets 150 - 400 K/uL 242           Latest Ref Rng & Units 06/12/2023    4:09 AM 06/11/2023   11:08 AM 02/21/2023    3:36 AM  CMP  Glucose 70 - 99 mg/dL 91  409  96   BUN 8 - 23 mg/dL 11  10  6    Creatinine 0.44 - 1.00 mg/dL 8.11  9.14  7.82   Sodium 135 - 145 mmol/L 137  136  139   Potassium 3.5 - 5.1 mmol/L 3.2  3.1  3.8   Chloride 98 - 111 mmol/L 101  97  106   CO2 22 - 32 mmol/L 26  28  23    Calcium  8.9 - 10.3 mg/dL 95.6  21.3  9.4   Total Protein 6.5 - 8.1 g/dL 6.9  8.0  7.0   Total Bilirubin 0.0 - 1.2 mg/dL 0.4  0.2  0.7   Alkaline Phos 38 - 126 U/L 68  78  70   AST 15 - 41 U/L 16  18  13    ALT 0 - 44 U/L 12  16  10        RADIOGRAPHIC STUDIES: I have personally reviewed the radiological images as listed and agreed with the findings in the report. No results found.    Orders Placed This Encounter  Procedures   MM Digital Screening    Standing Status:   Future    Expected Date:   10/30/2023    Expiration Date:   07/08/2024    Reason for Exam (SYMPTOM  OR DIAGNOSIS REQUIRED):   screening    Preferred [facility]   DG Bone Density    Standing Status:   Future    Expected Date:   10/30/2023    Expiration Date:    07/08/2024    Reason for Exam (SYMPTOM  OR DIAGNOSIS REQUIRED):   screening    Preferred imaging location?:   [facility]   All questions were answered. The patient knows to call the clinic with any problems, questions or concerns. No barriers to learning was detected. The total time spent in the appointment was 25 minutes, including review of chart and various tests results, discussions about plan of care and coordination of care plan     Sonja [place], MD 07/09/2023

## 2023-07-14 ENCOUNTER — Encounter: Payer: Self-pay | Admitting: Physical Therapy

## 2023-07-14 ENCOUNTER — Ambulatory Visit (INDEPENDENT_AMBULATORY_CARE_PROVIDER_SITE_OTHER): Admitting: Physical Therapy

## 2023-07-14 DIAGNOSIS — R2689 Other abnormalities of gait and mobility: Secondary | ICD-10-CM

## 2023-07-14 DIAGNOSIS — M6281 Muscle weakness (generalized): Secondary | ICD-10-CM

## 2023-07-14 DIAGNOSIS — M25561 Pain in right knee: Secondary | ICD-10-CM

## 2023-07-14 DIAGNOSIS — R2681 Unsteadiness on feet: Secondary | ICD-10-CM

## 2023-07-14 DIAGNOSIS — R293 Abnormal posture: Secondary | ICD-10-CM

## 2023-07-14 DIAGNOSIS — G8929 Other chronic pain: Secondary | ICD-10-CM

## 2023-07-14 DIAGNOSIS — Z9181 History of falling: Secondary | ICD-10-CM

## 2023-07-14 NOTE — Therapy (Signed)
 OUTPATIENT PHYSICAL THERAPY PROSTHETIC TREATMENT   Patient Name: Tammy [name] MRN: [id number] DOB:[birth year], 68 y.o., female Today's Date: 07/14/2023  END OF SESSION:  PT End of Session - 07/14/23 1438     Visit Number 26    Number of Visits 44    Date for PT Re-Evaluation 07/20/23    Authorization Type [employer] Medicare    Authorization Time Period [facility] MEDICARE APPROVED 4 PT VISITS 05/12/23-06/09/2023    Authorization - Visit Number 1    Authorization - Number of Visits 5    Progress Note Due on Visit 27    PT Start Time 1435    PT Stop Time 1517    PT Time Calculation (min) 42 min    Equipment Utilized During Treatment Gait belt    Activity Tolerance Patient tolerated treatment well    Behavior During Therapy WFL for tasks assessed/performed                Past Medical History:  Diagnosis Date   Anemia    hx of   Anxiety    on meds   Arthritis    back/bilateral ankles   Bipolar disorder (HCC)    Breast cancer (HCC) 2022   LEFT breast-radiation   Depression    on meds   Family history of breast cancer 07/10/2020   Family history of colon cancer 07/10/2020   Family history of ovarian cancer 07/10/2020   GERD (gastroesophageal reflux disease)    on meds   Heart murmur    dx by PCP   Hepatitis C    HTN (hypertension)    on meds   Hx of radiation therapy    Liver cirrhosis (HCC)    from hep c   Neuromuscular disorder (HCC)    Osteopenia    Personal history of radiation therapy    Stroke (HCC) 09/2012   slurred speech and LEFT sided weakness/notes 10/04/2012   Past Surgical History:  Procedure Laterality Date   AMPUTATION Left 11/04/2022   Procedure: LEFT ABOVE KNEE AMPUTATION;  Surgeon: Timothy Ford, MD;  Location: [facility] OR;  Service: Orthopedics;  Laterality: Left;   ANKLE FRACTURE SURGERY Left 2018   APPLICATION OF WOUND VAC Left 06/15/2022   Procedure: APPLICATION OF WOUND VAC;  Surgeon: Thornell Flirt, DO;  Location: [facility];  Service:  Plastics;  Laterality: Left;   APPLICATION OF WOUND VAC Left 09/14/2022   Procedure: APPLICATION OF WOUND VAC;  Surgeon: Thornell Flirt, DO;  Location: [facility] OR;  Service: Plastics;  Laterality: Left;   BREAST LUMPECTOMY Left 07/2020   BREAST LUMPECTOMY WITH RADIOACTIVE SEED AND SENTINEL LYMPH NODE BIOPSY Left 07/26/2020   Procedure: LEFT BREAST LUMPECTOMY WITH RADIOACTIVE SEED AND SENTINEL LYMPH NODE BIOPSY;  Surgeon: Oza Blumenthal, MD;  Location: [facility];  Service: General;  Laterality: Left;   CESAREAN SECTION  1986   COLONOSCOPY     DIAGNOSTIC LAPAROSCOPY  1985   ESOPHAGOGASTRODUODENOSCOPY N/A 02/21/2023   Procedure: ESOPHAGOGASTRODUODENOSCOPY (EGD);  Surgeon: Tobin Forts, MD;  Location: [facility];  Service: Gastroenterology;  Laterality: N/A;   HARDWARE REMOVAL Left 04/30/2022   Procedure: HARDWARE REMOVAL LEFT FEMUR;  Surgeon: Adonica Hoose, MD;  Location: [facility];  Service: Orthopedics;  Laterality: Left;  150   HARDWARE REMOVAL Left 11/04/2022   Procedure: HARDWARE REMOVAL INFECTED LEFT TOTAL KNEE ARTHROPLASTY;  Surgeon: Timothy Ford, MD;  Location: [facility] OR;  Service: Orthopedics;  Laterality: Left;   I & D KNEE  WITH POLY EXCHANGE Left 06/07/2022   Procedure: IRRIGATION AND DEBRIDEMENT KNEE WITH POLY EXCHANGE;  Surgeon: Adonica Hoose, MD;  Location: [facility];  Service: Orthopedics;  Laterality: Left;   INCISION AND DRAINAGE OF WOUND Left 06/04/2022   Procedure: IRRIGATION AND DEBRIDEMENT KNEE;  Surgeon: Adonica Hoose, MD;  Location: [facility];  Service: Orthopedics;  Laterality: Left;  60   INCISION AND DRAINAGE OF WOUND Left 06/15/2022   Procedure: excision of knee wound with Myriad;  Surgeon: Thornell Flirt, DO;  Location: [facility];  Service: Plastics;  Laterality: Left;   ORIF FEMUR FRACTURE Left 04/10/2021   Procedure: OPEN REDUCTION INTERNAL FIXATION (ORIF) DISTAL FEMUR FRACTURE;  Surgeon: Hardy Lia, MD;  Location: [facility] OR;  Service: Orthopedics;   Laterality: Left;   PARTIAL KNEE ARTHROPLASTY Left 04/30/2022   Procedure: DISTAL FEMUR REPLACEMENT;  Surgeon: Adonica Hoose, MD;  Location: [facility];  Service: Orthopedics;  Laterality: Left;  150   Patient Active Problem List   Diagnosis Date Noted   Acute upper GI bleed 06/12/2023   Melena 06/11/2023   Hypercalcemia 06/11/2023   ABLA (acute blood loss anemia) 06/11/2023   Left femoral vein DVT (HCC) 02/20/2023   GERD (gastroesophageal reflux disease) 02/20/2023   Acute upper gastrointestinal bleeding 02/19/2023   Bipolar 1 disorder, depressed, partial remission (HCC) 11/18/2022   Left above-knee amputee (HCC) 11/13/2022   Infection of total knee replacement (HCC) 11/04/2022   S/P AKA (above knee amputation) unilateral, left (HCC) 11/04/2022   Chronic hepatitis C with cirrhosis (HCC) 06/06/2022   Surgical wound dehiscence, initial encounter 06/04/2022   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, left, with nonunion, subsequent encounter 04/30/2022   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 04/30/2022   Hypokalemia 04/16/2021   Hip fracture requiring operative repair (HCC) 04/16/2021   Vitamin D  deficiency 04/12/2021   Hyponatremia 04/11/2021   Acute postoperative anemia due to expected blood loss 04/11/2021   Class 1 obesity 04/11/2021   Left comminuted and displaced fracture of the distal femur metaphysis 04/08/2021   History of CVA (cerebrovascular accident) 04/08/2021   Other cirrhosis of liver (HCC) 01/01/2021   Genetic testing 07/17/2020   Family history of breast cancer 07/10/2020   Family history of ovarian cancer 07/10/2020   Family history of colon cancer 07/10/2020   Malignant neoplasm of upper-outer quadrant of left breast in female, estrogen receptor positive (HCC) 07/09/2020   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   Chronic pain syndrome 10/06/2012   Nicotine  dependence 10/06/2012   Anxiety state  10/05/2012   Bipolar disorder, unspecified (HCC) 10/05/2012   Slurred speech 10/04/2012   Left-sided weakness 10/04/2012   Hyperkalemia 10/04/2012   Acute CVA (cerebrovascular accident) (HCC) 10/04/2012   Essential hypertension     PCP: Wright Heal, MD  REFERRING PROVIDER: Gearldean Keepers, MD  ONSET DATE: 01/25/2023 prosthesis delivery  REFERRING DIAG: Z61.096 (ICD-10-CM) - Hx of AKA (above knee amputation), left   THERAPY DIAG:  Other abnormalities of gait and mobility  History of falling  Unsteadiness on feet  Muscle weakness (generalized)  Chronic pain of right knee  Abnormal posture  Rationale for Evaluation and Treatment: Rehabilitation  SUBJECTIVE:   SUBJECTIVE STATEMENT: Patient was hospitalized 06/11/23 - 06/12/23 with GI bleed.  Check up with oncologist was good.  She got new socket with suction ring suspension on 06/18/2023. No falls since new socket.  She outset knee on 07/12/23.  She is wearing prosthesis daily most of awake hours.  Pt  accompanied by: Royston Cornea, significant other  PERTINENT HISTORY: AKA, anemia, anxiety, arthritis, Bipolar disorder, Breast CA left with radiation, HTN, Hep C, liver cirrhosis, Neuromuscular disorder, osteopenia, stroke Left hemiparesis 2014  PAIN:  Are you having pain? No pain Pain location: residual limb Pain description: soreness Aggravating factors: standing or walking too much, or twisting motion Relieving factors: resting  PRECAUTIONS: Fall  WEIGHT BEARING RESTRICTIONS: No  FALLS: Has patient fallen in last 6 months? Yes. Number of falls 1 no injuries  LIVING ENVIRONMENT: Lives with: lives with their spouse Lives in: Mobile home Home Access: Ramped entrance or 4 steps with double rail to [employer] layout: One level Has following equipment at home: Single point cane, Environmental consultant - 2 wheeled, Wheelchair (manual), Shower bench, bed side commode, Grab bars, and Ramped entry  OCCUPATION: retired from nursing  PLOF: Independent  prior to Fracture 04/10/2021.  Since fracture was able to get to cane for community until hardware broke.    PATIENT GOALS:  walk independently in community, travel, drive, take care of 4yo great granddaughter  OBJECTIVE:  COGNITION: Evaluation on 01/25/2023:  Overall cognitive status: Within functional limits for tasks assessed  POSTURE:  04/21/2023:  standing with rounded shoulders, forward head, flexed trunk , and weight shift right  Evaluation on 01/25/2023: rounded shoulders, forward head, flexed trunk , and weight shift right  LOWER EXTREMITY ROM:  ROM P:passive  A:active Right eval Left eval  Hip flexion    Hip extension  12deg  Hip abduction    Hip adduction    Hip internal rotation    Hip external rotation    Knee flexion    Knee extension    Ankle dorsiflexion    Ankle plantarflexion    Ankle inversion    Ankle eversion     (Blank rows = not tested)  LOWER EXTREMITY MMT:  MMT Right eval Left eval  Hip flexion    Hip extension    Hip abduction    Hip adduction    Hip internal rotation    Hip external rotation    Knee flexion    Knee extension    Ankle dorsiflexion    Ankle plantarflexion    Ankle inversion    Ankle eversion    At Evaluation all strength testing is grossly seated and functionally standing / gait. (Blank rows = not tested)  TRANSFERS: 04/21/2023:  Sit to stand: SBA w/c to RW requires without armrests pushing off w/c seat and stabilization without UE support but RW close for safety.   Stand to sit: SBA RW to position feet, then able to sit down using armrests on w/c to control descent.  Evaluation on 01/25/2023:  Sit to stand: SBA w/c to RW requires armrests on w/c and stabilization with RW.   Stand to sit: SBA RW to w/c requires armrests on w/c and stabilization with RW.    FUNCTIONAL TESTs:  05/12/2023: Randye Buttner balance 28/56  04/21/2023: TUG with RW: 58.28sec Berg balance: 28/56   03/02/2023: Randye Buttner Balance 22/56  Eval: Berg Balance  Scale: 15/56   GAIT: 07/14/2023:  Gait velocity with rollator walker 1.02 ft/sec   05/10/2023:    Max tolerable distance: Patient ambulated 155' with rolling walker with close supervision.  04/20/2023: gait velocity with RW: 0.51 ft/sec Pt able to amb with RW & Transfemoral prosthesis with supervision / requiring verbal cues for gait deviations.    03/18/2023: -Patient ambulated 129ft and performing a 180deg turn CGA for patient safety with intermittent SBA  01/26/2023: Patient ambulated 10 feet with rolling walker with minA but required constant cueing for technique and safety.  PALPATION: 03/18/2023: patient verbalized seeing areas of discoloration on residual limb a few days prior. Therapist palpation and examination of area, did not see any noticeable areas with patient stating that the areas must have gone away without issue.    CURRENT PROSTHETIC WEAR ASSESSMENT: 04/21/2023:  Patient is independent with: skin check, residual limb care, care of non-amputated limb, prosthetic cleaning, ply sock cleaning, correct ply sock adjustment, proper wear schedule/adjustment, and proper weight-bearing schedule/adjustment Donning prosthesis: modified independent Doffing prosthesis: modified independent Prosthetic wear tolerance: tolerated wear of prosthesis daily 6-7 hours but has decreased over last few days due to limb pain with recent fall.  Prosthetic weight bearing: pt tolerated standing 10 min for Berg Balance with partial weight on prosthesis.   Evaluation on 01/25/2023:  Patient is dependent with: skin check, residual limb care, care of non-amputated limb, prosthetic cleaning, ply sock cleaning, correct ply sock adjustment, proper wear schedule/adjustment, and proper weight-bearing schedule/adjustment Donning prosthesis: Min A Doffing prosthesis: SBA Prosthetic wear tolerance: tolerated wear 1 hour prior to PT evaluation.  Prosthetic weight bearing tolerance: 5 minutes with BUE support on  RW with limited wear on prosthesis.  Edema: pitting Prosthetic description: Ischial Containment socket with flexible inner socket, silicon liner with velcro strap suspension.    TODAY'S  TREATMENT:                                                                                                                             DATE:  07/14/2023: Therapeutic Activities: PT demo & verbal cues on donning liner & prosthesis with suction ring suspension.  Also use of release button to increase air exchange only with weight bearing. She should press this button when standing up after sitting for a while.  Her shorts can not be under the liner or inside socket, this will make going to bathroom difficult.   Pt reviewed foot position for stationary activities to facilitate prosthetic knee stability. PT also reviewed need to use folded towel or pillow to level her pelvis in sitting.  Pt & fiance verbalized understanding.   Pt amb 150' with rollator walker with visual cue on rollator seat for step width.  Pt was adducting prosthetic foot which causes hip instability in stance.   Pt neg 6.5" curb with rollator walker with CGA & verbal cues.      TREATMENT:  DATE:  06/09/2023: Therapeutic Activities: PT strongly recommended going to ED if she has any more coffee ground emesis as is indication of serious issue.  Pt & fianc verbalized understanding. Patient reports that she has 2 bathrooms in her home.  1 bathroom is off the master bedroom and the second 1 is in the hall.  Patient reported having difficulty positioning the wheelchair at the head of the toilet for the scooting transfer discussed last session.  PT demo with similar set up of a bathroom (toilet on left side) and demonstrated a side scooting transfer facing backwards on the toilet when she is seated on the toilet.  Patient able to return  demonstration using grab bar that she indicates she has at the home.  Patient reports this did not bother her knee nor did she feel like she was anywhere at risk of falling.  PT also did not note any risk for a fall with this type transfer.  Patient's fianc is to attempt using rollator walker in the second bathroom of the home with the patient observing.  They indicated that they would have to remove the door in order to use the wheelchair in that bathroom but feel like the rollator may fit in there or the standard walker.  If either walker with it into the second bathroom PT is recommending to switch the raised toilet seat to that bathroom and use it during the day when she is wearing the prosthesis.  Then she can use the primary bathroom of the master bedroom when she needs to transfer without the prosthesis.  All recent falls have been when patient was trying to transfer with the prosthesis off early in the morning or late at night.  PT is working on this set up and functional activity to decrease patient's risk of fall and potential injury.  Patient verbalized understanding and will attempt what was discussed today at home. Patient reports she was too fatigued to continue further with the PT session.  PT and patient read on prosthesis including tightening suspension strap.  Session ended early due to patient being fatigued.    TREATMENT:                                                                                                                             DATE:  05/26/2023: Prosthetic Training with Transfemoral Prosthesis: PT assessed patient's new rollator walker that she just purchased.  The walker is set for the proper height and the brakes appeared to be working correctly. Patient ambulated 100' with rollator walker safely including turning to sit on the seat for rest. Patient negotiated 12*ramp and 6.5" curb with rollator walker safely. PT reviewed walking program to improve her activity  tolerance with short, medium and long walks.  Patient verbalized understanding  Therapeutic activities: Patient reports fall this morning was more controlled with no bruising or injury.  All falls recently appear to be when the prosthesis wears off. PT demo  and verbal cues on a scoot forward transfer from wheelchair to toilet where she would be facing backwards on the toilet.  When she finished using the bathroom then she could scoot backwards directly onto the wheelchair.  This would decrease her chance of falling during the transfer without the prosthesis first thing in the morning.  Patient and fianc verbalized understanding.  Both report they feel like this will decrease her chance of falling significantly.    HOME EXERCISE PROGRAM: Access Code: [email or web address] URL: [email or web address] Date: 04/12/2023 Prepared by: Lorie Rook   Exercises - Seated Table Hamstring Stretch  - 1-3 x daily - 7 x weekly - 1 sets - 2-3 reps - 20-30 seconds hold - Modified Thomas Stretch  - 2 x daily - 7 x weekly - 1 sets - 2-3 reps - 30 seconds hold - Hip Flexor Stretch at [employer] of Bed  - 2 x daily - 7 x weekly - 1 sets - 2-3 reps - 30 seconds hold - Standing posture with back to counter  - 2 x daily - 7 x weekly - 1 sets - 1 reps - 5-10 minutes hold - Upright Stance at Door Frame Single Arm  - 3-6 x daily - 7 x weekly - 1 sets - 2 reps - 2 deep breathes hold - Upright Stance at Door Frame with Both Arms  - 3-6 x daily - 7 x weekly - 1 sets - 2 reps - 2 deep breathes hold [place]  Patient Portal      Do each exercise 1-2  times per day Do each exercise 5-10 repetitions Hold each exercise for 2 seconds to feel your location  AT SINK FIND YOUR MIDLINE POSITION AND PLACE FEET EQUAL DISTANCE FROM THE MIDLINE.  Try to find this position when standing still for activities.   USE TAPE ON FLOOR TO MARK THE MIDLINE POSITION which is even with middle of sink.  You also should try to feel with  your limb pressure in socket.  You are trying to feel with limb what you used to feel with the bottom of your foot.  Side to Side Shift: Moving your hips only (not shoulders): move weight onto your left leg, HOLD/FEEL pressure in socket.  Move back to equal weight on each leg, HOLD/FEEL pressure in socket. Move weight onto your right leg, HOLD/FEEL pressure in socket. Move back to equal weight on each leg, HOLD/FEEL pressure in socket. Repeat.  Start with both hands on sink, progress to hand on prosthetic side only, then no hands.  Front to Back Shift: Moving your hips only (not shoulders): move your weight forward onto your toes, HOLD/FEEL pressure in socket. Move your weight back to equal Flat Foot on both legs, HOLD/FEEL  pressure in socket. Move your weight back onto your heels, HOLD/FEEL  pressure in socket. Move your weight back to equal on both legs, HOLD/FEEL  pressure in socket. Repeat.  Start with both hands on sink, progress to hand on prosthetic side only, then no hands.  Moving Cones / Cups: With equal weight on each leg: Hold on with one hand the first time, then progress to no hand supports. Move cups from one side of sink to the other. Place cups ~2" out of your reach, progress to 10" beyond reach.  Place one hand in middle of sink and reach with other hand. Do both arms.  Then hover one hand and move cups with other hand.  Overhead/Upward Reaching: alternated reaching up to  top cabinets or ceiling if no cabinets present. Keep equal weight on each leg. Start with one hand support on counter while other hand reaches and progress to no hand support with reaching.  ace one hand in middle of sink and reach with other hand. Do both arms.  Then hover one hand and move cups with other hand.  5.   Looking Over Shoulders: With equal weight on each leg: alternate turning to look over your shoulders with one hand support on counter as needed.  Start with head motions only to look in front of shoulder,  then even with shoulder and progress to looking behind you. To look to side, move head /eyes, then shoulder on side looking pulls back, shift more weight to side looking and pull hip back. Place one hand in middle of sink and let go with other hand so your shoulder can pull back. Switch hands to look other way.   Then hover one hand and look over shoulder. If looking right, use left hand at sink. If looking left, use right hand at sink. 6.  Stepping with leg that is not amputated:  Move items under cabinet out of your way. Shift your hips/pelvis so weight on prosthesis. Tighten muscles in hip on prosthetic side.  SLOWLY step other leg so front of foot is in cabinet. Then step back to floor.    ASSESSMENT:  CLINICAL IMPRESSION: PT instructed pt in donning new socket with suction ring and reviewed prosthetic care.  Pt improved gait with visual cue for step width.   Patient continues to benefit from skilled PT and needs additional PT to improve overall function and safety with the prosthesis.    OBJECTIVE IMPAIRMENTS: Abnormal gait, decreased activity tolerance, decreased balance, decreased endurance, decreased knowledge of condition, decreased knowledge of use of DME, decreased mobility, difficulty walking, decreased ROM, decreased strength, increased edema, impaired flexibility, postural dysfunction, prosthetic dependency , obesity, and pain.   ACTIVITY LIMITATIONS: carrying, lifting, bending, sitting, standing, stairs, transfers, and locomotion level  PARTICIPATION LIMITATIONS: meal prep, cleaning, and community activity  PERSONAL FACTORS: Fitness, Time since onset of injury/illness/exacerbation, and 3+ comorbidities: see PMH are also affecting patient's functional outcome.   REHAB POTENTIAL: Good  CLINICAL DECISION MAKING: Evolving/moderate complexity  EVALUATION COMPLEXITY: Moderate   GOALS: Goals reviewed with patient? Yes  SHORT TERM GOALS: Target date: 05/20/2023  Patient reports no  falls for 2 week time period.  Baseline: SEE OBJECTIVE DATA Goal status: MET 05/17/2023 2.  Patient reports tolerating prosthesis >12 hrs total /day without skin issues or limb pain after standing. Baseline: SEE OBJECTIVE DATA Goal status: Met    07/14/2023  3.  Berg Balance >/= 32/56 Baseline: SEE OBJECTIVE DATA Goal status: Ongoing    07/14/2023  4. Patient ambulates 150' with RW & prosthesis with supervision / verbal cues only for gait deviations. Baseline: SEE OBJECTIVE DATA Goal status: Met 07/14/2023  5. Patient negotiates ramps & curbs with RW & prosthesis with supervision. Baseline: SEE OBJECTIVE DATA Goal status: MET  05/17/2023   UPDATED LONG TERM GOALS: Target date: 07/20/2023  Patient demonstrates & verbalized understanding of prosthetic care to enable safe utilization of prosthesis. Baseline: SEE OBJECTIVE DATA Goal status: Ongoing   07/14/2023  Patient tolerates prosthesis wear >90% of awake hours without skin or residual limb pain issues. Baseline: SEE OBJECTIVE DATA Goal status: Ongoing    07/14/2023  Berg Balance >36/56 to indicate lower fall risk Baseline: SEE OBJECTIVE DATA Goal status: Ongoing  07/14/2023  Patient ambulates >300' with prosthesis and LRAD modified independent. Baseline: SEE OBJECTIVE DATA Goal status: Ongoing  07/14/2023  Patient negotiates ramps, curbs & stairs with single rail with prosthesis  and LRAD modified independent. Baseline: SEE OBJECTIVE DATA Goal status: Ongoing  07/14/2023   Patient reports right knee pain </= 2/10 with standing and gait activities.   Baseline: SEE OBJECTIVE DATA Goal status:   Ongoing   07/14/2023  PLAN:  PT FREQUENCY:  2x/wk  PT DURATION: 90 days / 13 weeks  PLANNED INTERVENTIONS: 97164- PT Re-evaluation, 97110-Therapeutic exercises, 97530- Therapeutic activity, 97112- Neuromuscular re-education, [phone]- Self Care, 19147- Gait training, [phone]- Prosthetic training, Patient/Family education, Balance training, Stair  training, DME instructions, and physical performance testing  PLAN FOR NEXT SESSION:  2x/wk thru 6/10 working towards LTGs.    Lorie Rook, PT, DPT 07/14/2023, 4:25 PM   Date of referral: 12/22/2022 Referring provider: Gearldean Keepers, MD Referring diagnosis? O13.086 (ICD-10-CM) - Hx of AKA (above knee amputation), left  Treatment diagnosis? (if different than referring diagnosis)  Other abnormalities of gait and mobility  ICD-10-CM: R26.89    Unsteadiness on feet  ICD-10-CM: R26.81    Muscle weakness (generalized)  ICD-10-CM: M62.81    Chronic pain of right knee  M25.561 & G89.29   History of falling Z91.81  Abnormal Posture  R29.3   What was this (referring dx) caused by? Surgery (Type: AKA)   Nature of Condition: Initial Onset (within last 3 months) for prosthesis delivery              Laterality: Lt   Current Functional Measure Score: Other 05/12/2023 & 04/21/2023 Randye Buttner Balance 28/56  1/21/2025Randye Buttner Balance 22/56  at eval Berg was 15/56;     Objective measurements identify impairments when they are compared to normal values, the uninvolved extremity, and prior level of function.             [x]  Yes             []  No   Objective assessment of functional ability: Moderate functional limitations              Briefly describe symptoms: patient requires lots of repetition for education and safety with use of her prosthesis.  Patient has had repetitive falls but these have decreased with PT educating patient.  All of most recent falls have been early in the morning or late in the evening when the prosthesis has been removed.  PT has been addressing those transfers which patient has improved.  Patient has improved her function with a rollator walker but continues to need instruction for prosthetic control and progression of activities.   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     Average pain intensity:             Last 24 hours: 0/10              Past week: 1/10   How often does the pt experience symptoms? Constantly   How much have the symptoms interfered with usual daily activities? Extremely   How has condition changed since care began at this facility? Better   In general, how is the patients overall health? Very Good     BACK PAIN (STarT Back Screening Tool) No

## 2023-07-15 ENCOUNTER — Encounter: Payer: Self-pay | Admitting: Physical Therapy

## 2023-07-15 ENCOUNTER — Ambulatory Visit (INDEPENDENT_AMBULATORY_CARE_PROVIDER_SITE_OTHER): Admitting: Physical Therapy

## 2023-07-15 DIAGNOSIS — R2681 Unsteadiness on feet: Secondary | ICD-10-CM

## 2023-07-15 DIAGNOSIS — R293 Abnormal posture: Secondary | ICD-10-CM

## 2023-07-15 DIAGNOSIS — R2689 Other abnormalities of gait and mobility: Secondary | ICD-10-CM

## 2023-07-15 DIAGNOSIS — M6281 Muscle weakness (generalized): Secondary | ICD-10-CM

## 2023-07-15 DIAGNOSIS — G8929 Other chronic pain: Secondary | ICD-10-CM

## 2023-07-15 DIAGNOSIS — M25561 Pain in right knee: Secondary | ICD-10-CM

## 2023-07-15 DIAGNOSIS — Z9181 History of falling: Secondary | ICD-10-CM

## 2023-07-15 NOTE — Therapy (Signed)
 OUTPATIENT PHYSICAL THERAPY PROSTHETIC TREATMENT   Patient Name: Tammy [name] MRN: [id number] DOB:03-06-1955, 68 y.o., female Today's Date: 07/15/2023  END OF SESSION:  PT End of Session - 07/15/23 1603     Visit Number 27    Number of Visits 44    Date for PT Re-Evaluation 07/20/23    Authorization Type [employer] Medicare    Authorization Time Period [facility] MEDICARE APPROVED 4 PT VISITS 05/12/23-06/09/2023    Authorization - Visit Number 2    Authorization - Number of Visits 5    Progress Note Due on Visit 27    PT Start Time 1600    PT Stop Time 1638    PT Time Calculation (min) 38 min    Equipment Utilized During Treatment Gait belt    Activity Tolerance Patient tolerated treatment well    Behavior During Therapy WFL for tasks assessed/performed                 Past Medical History:  Diagnosis Date   Anemia    hx of   Anxiety    on meds   Arthritis    back/bilateral ankles   Bipolar disorder (HCC)    Breast cancer (HCC) 2022   LEFT breast-radiation   Depression    on meds   Family history of breast cancer 07/10/2020   Family history of colon cancer 07/10/2020   Family history of ovarian cancer 07/10/2020   GERD (gastroesophageal reflux disease)    on meds   Heart murmur    dx by PCP   Hepatitis C    HTN (hypertension)    on meds   Hx of radiation therapy    Liver cirrhosis (HCC)    from hep c   Neuromuscular disorder (HCC)    Osteopenia    Personal history of radiation therapy    Stroke (HCC) 09/2012   slurred speech and LEFT sided weakness/notes 10/04/2012   Past Surgical History:  Procedure Laterality Date   AMPUTATION Left 11/04/2022   Procedure: LEFT ABOVE KNEE AMPUTATION;  Surgeon: Timothy Ford, MD;  Location: [facility] OR;  Service: Orthopedics;  Laterality: Left;   ANKLE FRACTURE SURGERY Left 2018   APPLICATION OF WOUND VAC Left 06/15/2022   Procedure: APPLICATION OF WOUND VAC;  Surgeon: Thornell Flirt, DO;  Location: [facility];  Service:  Plastics;  Laterality: Left;   APPLICATION OF WOUND VAC Left 09/14/2022   Procedure: APPLICATION OF WOUND VAC;  Surgeon: Thornell Flirt, DO;  Location: [facility] OR;  Service: Plastics;  Laterality: Left;   BREAST LUMPECTOMY Left 07/2020   BREAST LUMPECTOMY WITH RADIOACTIVE SEED AND SENTINEL LYMPH NODE BIOPSY Left 07/26/2020   Procedure: LEFT BREAST LUMPECTOMY WITH RADIOACTIVE SEED AND SENTINEL LYMPH NODE BIOPSY;  Surgeon: Oza Blumenthal, MD;  Location: [facility];  Service: General;  Laterality: Left;   CESAREAN SECTION  1986   COLONOSCOPY     DIAGNOSTIC LAPAROSCOPY  1985   ESOPHAGOGASTRODUODENOSCOPY N/A 02/21/2023   Procedure: ESOPHAGOGASTRODUODENOSCOPY (EGD);  Surgeon: Tobin Forts, MD;  Location: [facility];  Service: Gastroenterology;  Laterality: N/A;   HARDWARE REMOVAL Left 04/30/2022   Procedure: HARDWARE REMOVAL LEFT FEMUR;  Surgeon: Adonica Hoose, MD;  Location: [facility];  Service: Orthopedics;  Laterality: Left;  150   HARDWARE REMOVAL Left 11/04/2022   Procedure: HARDWARE REMOVAL INFECTED LEFT TOTAL KNEE ARTHROPLASTY;  Surgeon: Timothy Ford, MD;  Location: [facility] OR;  Service: Orthopedics;  Laterality: Left;   I & D  KNEE WITH POLY EXCHANGE Left 06/07/2022   Procedure: IRRIGATION AND DEBRIDEMENT KNEE WITH POLY EXCHANGE;  Surgeon: Adonica Hoose, MD;  Location: [facility];  Service: Orthopedics;  Laterality: Left;   INCISION AND DRAINAGE OF WOUND Left 06/04/2022   Procedure: IRRIGATION AND DEBRIDEMENT KNEE;  Surgeon: Adonica Hoose, MD;  Location: [facility];  Service: Orthopedics;  Laterality: Left;  60   INCISION AND DRAINAGE OF WOUND Left 06/15/2022   Procedure: excision of knee wound with Myriad;  Surgeon: Thornell Flirt, DO;  Location: [facility];  Service: Plastics;  Laterality: Left;   ORIF FEMUR FRACTURE Left 04/10/2021   Procedure: OPEN REDUCTION INTERNAL FIXATION (ORIF) DISTAL FEMUR FRACTURE;  Surgeon: Hardy Lia, MD;  Location: [facility] OR;  Service: Orthopedics;   Laterality: Left;   PARTIAL KNEE ARTHROPLASTY Left 04/30/2022   Procedure: DISTAL FEMUR REPLACEMENT;  Surgeon: Adonica Hoose, MD;  Location: [facility];  Service: Orthopedics;  Laterality: Left;  150   Patient Active Problem List   Diagnosis Date Noted   Acute upper GI bleed 06/12/2023   Melena 06/11/2023   Hypercalcemia 06/11/2023   ABLA (acute blood loss anemia) 06/11/2023   Left femoral vein DVT (HCC) 02/20/2023   GERD (gastroesophageal reflux disease) 02/20/2023   Acute upper gastrointestinal bleeding 02/19/2023   Bipolar 1 disorder, depressed, partial remission (HCC) 11/18/2022   Left above-knee amputee (HCC) 11/13/2022   Infection of total knee replacement (HCC) 11/04/2022   S/P AKA (above knee amputation) unilateral, left (HCC) 11/04/2022   Chronic hepatitis C with cirrhosis (HCC) 06/06/2022   Surgical wound dehiscence, initial encounter 06/04/2022   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, left, with nonunion, subsequent encounter 04/30/2022   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 04/30/2022   Hypokalemia 04/16/2021   Hip fracture requiring operative repair (HCC) 04/16/2021   Vitamin D  deficiency 04/12/2021   Hyponatremia 04/11/2021   Acute postoperative anemia due to expected blood loss 04/11/2021   Class 1 obesity 04/11/2021   Left comminuted and displaced fracture of the distal femur metaphysis 04/08/2021   History of CVA (cerebrovascular accident) 04/08/2021   Other cirrhosis of liver (HCC) 01/01/2021   Genetic testing 07/17/2020   Family history of breast cancer 07/10/2020   Family history of ovarian cancer 07/10/2020   Family history of colon cancer 07/10/2020   Malignant neoplasm of upper-outer quadrant of left breast in female, estrogen receptor positive (HCC) 07/09/2020   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   Chronic pain syndrome 10/06/2012   Nicotine  dependence 10/06/2012   Anxiety state  10/05/2012   Bipolar disorder, unspecified (HCC) 10/05/2012   Slurred speech 10/04/2012   Left-sided weakness 10/04/2012   Hyperkalemia 10/04/2012   Acute CVA (cerebrovascular accident) (HCC) 10/04/2012   Essential hypertension     PCP: Wright Heal, MD  REFERRING PROVIDER: Gearldean Keepers, MD  ONSET DATE: 01/25/2023 prosthesis delivery  REFERRING DIAG: Z61.096 (ICD-10-CM) - Hx of AKA (above knee amputation), left   THERAPY DIAG:  Other abnormalities of gait and mobility  History of falling  Unsteadiness on feet  Muscle weakness (generalized)  Chronic pain of right knee  Abnormal posture  Rationale for Evaluation and Treatment: Rehabilitation  SUBJECTIVE:   SUBJECTIVE STATEMENT: She is using w/c today due heavy rain.   Pt accompanied by: Royston Cornea, significant other  PERTINENT HISTORY: AKA, anemia, anxiety, arthritis, Bipolar disorder, Breast CA left with radiation, HTN, Hep C, liver cirrhosis, Neuromuscular disorder, osteopenia, stroke Left hemiparesis 2014  PAIN:  Are you having pain?  No pain Pain location: residual limb Pain description: soreness Aggravating factors: standing or walking too much, or twisting motion Relieving factors: resting  PRECAUTIONS: Fall  WEIGHT BEARING RESTRICTIONS: No  FALLS: Has patient fallen in last 6 months? Yes. Number of falls 1 no injuries  LIVING ENVIRONMENT: Lives with: lives with their spouse Lives in: Mobile home Home Access: Ramped entrance or 4 steps with double rail to [employer] layout: One level Has following equipment at home: Single point cane, Environmental consultant - 2 wheeled, Wheelchair (manual), Shower bench, bed side commode, Grab bars, and Ramped entry  OCCUPATION: retired from nursing  PLOF: Independent prior to Fracture 04/10/2021.  Since fracture was able to get to cane for community until hardware broke.    PATIENT GOALS:  walk independently in community, travel, drive, take care of 4yo great granddaughter  OBJECTIVE:   COGNITION: Evaluation on 01/25/2023:  Overall cognitive status: Within functional limits for tasks assessed  POSTURE:  04/21/2023:  standing with rounded shoulders, forward head, flexed trunk , and weight shift right  Evaluation on 01/25/2023: rounded shoulders, forward head, flexed trunk , and weight shift right  LOWER EXTREMITY ROM:  ROM P:passive  A:active Right eval Left eval  Hip flexion    Hip extension  12deg  Hip abduction    Hip adduction    Hip internal rotation    Hip external rotation    Knee flexion    Knee extension    Ankle dorsiflexion    Ankle plantarflexion    Ankle inversion    Ankle eversion     (Blank rows = not tested)  LOWER EXTREMITY MMT:  MMT Right eval Left eval  Hip flexion    Hip extension    Hip abduction    Hip adduction    Hip internal rotation    Hip external rotation    Knee flexion    Knee extension    Ankle dorsiflexion    Ankle plantarflexion    Ankle inversion    Ankle eversion    At Evaluation all strength testing is grossly seated and functionally standing / gait. (Blank rows = not tested)  TRANSFERS: 04/21/2023:  Sit to stand: SBA w/c to RW requires without armrests pushing off w/c seat and stabilization without UE support but RW close for safety.   Stand to sit: SBA RW to position feet, then able to sit down using armrests on w/c to control descent.  Evaluation on 01/25/2023:  Sit to stand: SBA w/c to RW requires armrests on w/c and stabilization with RW.   Stand to sit: SBA RW to w/c requires armrests on w/c and stabilization with RW.    FUNCTIONAL TESTs:  05/12/2023: Randye Buttner balance 28/56  04/21/2023: TUG with RW: 58.28sec Berg balance: 28/56   03/02/2023: Randye Buttner Balance 22/56  Eval: Berg Balance Scale: 15/56   GAIT: 07/14/2023:  Gait velocity with rollator walker 1.02 ft/sec   05/10/2023:    Max tolerable distance: Patient ambulated 155' with rolling walker with close supervision.  04/20/2023: gait velocity  with RW: 0.51 ft/sec Pt able to amb with RW & Transfemoral prosthesis with supervision / requiring verbal cues for gait deviations.    03/18/2023: -Patient ambulated 164ft and performing a 180deg turn CGA for patient safety with intermittent SBA  01/26/2023: Patient ambulated 10 feet with rolling walker with minA but required constant cueing for technique and safety.  PALPATION: 03/18/2023: patient verbalized seeing areas of discoloration on residual limb a few days prior. Therapist palpation and examination  of area, did not see any noticeable areas with patient stating that the areas must have gone away without issue.    CURRENT PROSTHETIC WEAR ASSESSMENT: 04/21/2023:  Patient is independent with: skin check, residual limb care, care of non-amputated limb, prosthetic cleaning, ply sock cleaning, correct ply sock adjustment, proper wear schedule/adjustment, and proper weight-bearing schedule/adjustment Donning prosthesis: modified independent Doffing prosthesis: modified independent Prosthetic wear tolerance: tolerated wear of prosthesis daily 6-7 hours but has decreased over last few days due to limb pain with recent fall.  Prosthetic weight bearing: pt tolerated standing 10 min for Berg Balance with partial weight on prosthesis.   Evaluation on 01/25/2023:  Patient is dependent with: skin check, residual limb care, care of non-amputated limb, prosthetic cleaning, ply sock cleaning, correct ply sock adjustment, proper wear schedule/adjustment, and proper weight-bearing schedule/adjustment Donning prosthesis: Min A Doffing prosthesis: SBA Prosthetic wear tolerance: tolerated wear 1 hour prior to PT evaluation.  Prosthetic weight bearing tolerance: 5 minutes with BUE support on RW with limited wear on prosthesis.  Edema: pitting Prosthetic description: Ischial Containment socket with flexible inner socket, silicon liner with velcro strap suspension.    TODAY'S  TREATMENT:                                                                                                                              DATE:  07/14/2023: Prosthetic Training with Transfemoral Prosthesis: Pre-gait in //bars using mirror, PT demo & verbal cues on step width with prosthetic knee outset to facilitate hip stability in stance and weight shift over the prosthesis in stance by focusing on right heel rolling up prior to stepping the right foot forward.  Patient improved with PT instruction and repetition.  Patient verbalized understanding the need to do this with gait with a walker in order to create that hip stability.  Neuromuscular reeducation: Standing balance activities (prosthetic toes 2 inches posterior to sound foot toes to facilitate prosthetic knee stability and stationary stance)  On floor eyes open head movements right/left, up/down and diagonals with only 2 light touch is for stability On floor eyes closed head movements right/left, up/down and diagonals with intermittent touch. PT demo and verbal cues on how to perform this HEP in corner with a chair back in front of her including how to get in position.  Patient and fianc verbalized understanding. Standing with posterior pelvis against doorframe with feet under pelvis and facilitating shoulders over hips for upright posture.  Reaching single upper extremity overhead for 2 deep breaths 2 reps each and bilateral upper extremities overhead for 2 deep breaths 2 reps.  PT recommending doing this during the day when exiting the bathroom to work on upright posture.  Patient verbalized understanding.     TREATMENT:  DATE:  07/14/2023: Prosthetic Training with Transfemoral Prosthesis: PT demo & verbal cues on donning liner & prosthesis with suction ring suspension.  Also use of release button to increase air exchange only with weight bearing. She  should press this button when standing up after sitting for a while.  Her shorts can not be under the liner or inside socket, this will make going to bathroom difficult.   Pt reviewed foot position for stationary activities to facilitate prosthetic knee stability. PT also reviewed need to use folded towel or pillow to level her pelvis in sitting.  Pt & fiance verbalized understanding.   Pt amb 150' with rollator walker with visual cue on rollator seat for step width.  Pt was adducting prosthetic foot which causes hip instability in stance.   Pt neg 6.5" curb with rollator walker with CGA & verbal cues.      TREATMENT:                                                                                                                             DATE:  06/09/2023: Prosthetic Training with Transfemoral Prosthesis: PT strongly recommended going to ED if she has any more coffee ground emesis as is indication of serious issue.  Pt & fianc verbalized understanding. Patient reports that she has 2 bathrooms in her home.  1 bathroom is off the master bedroom and the second 1 is in the hall.  Patient reported having difficulty positioning the wheelchair at the head of the toilet for the scooting transfer discussed last session.  PT demo with similar set up of a bathroom (toilet on left side) and demonstrated a side scooting transfer facing backwards on the toilet when she is seated on the toilet.  Patient able to return demonstration using grab bar that she indicates she has at the home.  Patient reports this did not bother her knee nor did she feel like she was anywhere at risk of falling.  PT also did not note any risk for a fall with this type transfer.  Patient's fianc is to attempt using rollator walker in the second bathroom of the home with the patient observing.  They indicated that they would have to remove the door in order to use the wheelchair in that bathroom but feel like the rollator may fit in there or  the standard walker.  If either walker with it into the second bathroom PT is recommending to switch the raised toilet seat to that bathroom and use it during the day when she is wearing the prosthesis.  Then she can use the primary bathroom of the master bedroom when she needs to transfer without the prosthesis.  All recent falls have been when patient was trying to transfer with the prosthesis off early in the morning or late at night.  PT is working on this set up and functional activity to decrease patient's risk of fall and potential injury.  Patient verbalized understanding  and will attempt what was discussed today at home. Patient reports she was too fatigued to continue further with the PT session.  PT and patient read on prosthesis including tightening suspension strap.  Session ended early due to patient being fatigued.      HOME EXERCISE PROGRAM: Access Code: [email or web address] URL: [email or web address] Date: 04/12/2023 Prepared by: Lorie Rook   Exercises - Seated Table Hamstring Stretch  - 1-3 x daily - 7 x weekly - 1 sets - 2-3 reps - 20-30 seconds hold - Modified Thomas Stretch  - 2 x daily - 7 x weekly - 1 sets - 2-3 reps - 30 seconds hold - Hip Flexor Stretch at [employer] of Bed  - 2 x daily - 7 x weekly - 1 sets - 2-3 reps - 30 seconds hold - Standing posture with back to counter  - 2 x daily - 7 x weekly - 1 sets - 1 reps - 5-10 minutes hold - Upright Stance at Door Frame Single Arm  - 3-6 x daily - 7 x weekly - 1 sets - 2 reps - 2 deep breathes hold - Upright Stance at Door Frame with Both Arms  - 3-6 x daily - 7 x weekly - 1 sets - 2 reps - 2 deep breathes hold [facility]  Patient Portal      Do each exercise 1-2  times per day Do each exercise 5-10 repetitions Hold each exercise for 2 seconds to feel your location  AT SINK FIND YOUR MIDLINE POSITION AND PLACE FEET EQUAL DISTANCE FROM THE MIDLINE.  Try to find this position when standing still for activities.    USE TAPE ON FLOOR TO MARK THE MIDLINE POSITION which is even with middle of sink.  You also should try to feel with your limb pressure in socket.  You are trying to feel with limb what you used to feel with the bottom of your foot.  Side to Side Shift: Moving your hips only (not shoulders): move weight onto your left leg, HOLD/FEEL pressure in socket.  Move back to equal weight on each leg, HOLD/FEEL pressure in socket. Move weight onto your right leg, HOLD/FEEL pressure in socket. Move back to equal weight on each leg, HOLD/FEEL pressure in socket. Repeat.  Start with both hands on sink, progress to hand on prosthetic side only, then no hands.  Front to Back Shift: Moving your hips only (not shoulders): move your weight forward onto your toes, HOLD/FEEL pressure in socket. Move your weight back to equal Flat Foot on both legs, HOLD/FEEL  pressure in socket. Move your weight back onto your heels, HOLD/FEEL  pressure in socket. Move your weight back to equal on both legs, HOLD/FEEL  pressure in socket. Repeat.  Start with both hands on sink, progress to hand on prosthetic side only, then no hands.  Moving Cones / Cups: With equal weight on each leg: Hold on with one hand the first time, then progress to no hand supports. Move cups from one side of sink to the other. Place cups ~2" out of your reach, progress to 10" beyond reach.  Place one hand in middle of sink and reach with other hand. Do both arms.  Then hover one hand and move cups with other hand.  Overhead/Upward Reaching: alternated reaching up to top cabinets or ceiling if no cabinets present. Keep equal weight on each leg. Start with one hand support on counter while other hand reaches and progress to no hand support with  reaching.  ace one hand in middle of sink and reach with other hand. Do both arms.  Then hover one hand and move cups with other hand.  5.   Looking Over Shoulders: With equal weight on each leg: alternate turning to look over  your shoulders with one hand support on counter as needed.  Start with head motions only to look in front of shoulder, then even with shoulder and progress to looking behind you. To look to side, move head /eyes, then shoulder on side looking pulls back, shift more weight to side looking and pull hip back. Place one hand in middle of sink and let go with other hand so your shoulder can pull back. Switch hands to look other way.   Then hover one hand and look over shoulder. If looking right, use left hand at sink. If looking left, use right hand at sink. 6.  Stepping with leg that is not amputated:  Move items under cabinet out of your way. Shift your hips/pelvis so weight on prosthesis. Tighten muscles in hip on prosthetic side.  SLOWLY step other leg so front of foot is in cabinet. Then step back to floor.    ASSESSMENT:  CLINICAL IMPRESSION: Patient was able to improve left hip stability in stance with pre-gait activities.  Patient and her fianc appear to understand HEP to work on balance and upright posture.  Patient continues to benefit from skilled PT and needs additional PT to improve overall function and safety with the prosthesis.    OBJECTIVE IMPAIRMENTS: Abnormal gait, decreased activity tolerance, decreased balance, decreased endurance, decreased knowledge of condition, decreased knowledge of use of DME, decreased mobility, difficulty walking, decreased ROM, decreased strength, increased edema, impaired flexibility, postural dysfunction, prosthetic dependency , obesity, and pain.   ACTIVITY LIMITATIONS: carrying, lifting, bending, sitting, standing, stairs, transfers, and locomotion level  PARTICIPATION LIMITATIONS: meal prep, cleaning, and community activity  PERSONAL FACTORS: Fitness, Time since onset of injury/illness/exacerbation, and 3+ comorbidities: see PMH are also affecting patient's functional outcome.   REHAB POTENTIAL: Good  CLINICAL DECISION MAKING: Evolving/moderate  complexity  EVALUATION COMPLEXITY: Moderate   GOALS: Goals reviewed with patient? Yes  SHORT TERM GOALS: Target date: 05/20/2023  Patient reports no falls for 2 week time period.  Baseline: SEE OBJECTIVE DATA Goal status: MET 05/17/2023 2.  Patient reports tolerating prosthesis >12 hrs total /day without skin issues or limb pain after standing. Baseline: SEE OBJECTIVE DATA Goal status: Met    07/14/2023  3.  Berg Balance >/= 32/56 Baseline: SEE OBJECTIVE DATA Goal status: Ongoing    07/14/2023  4. Patient ambulates 150' with RW & prosthesis with supervision / verbal cues only for gait deviations. Baseline: SEE OBJECTIVE DATA Goal status: Met 07/14/2023  5. Patient negotiates ramps & curbs with RW & prosthesis with supervision. Baseline: SEE OBJECTIVE DATA Goal status: MET  05/17/2023   UPDATED LONG TERM GOALS: Target date: 07/20/2023  Patient demonstrates & verbalized understanding of prosthetic care to enable safe utilization of prosthesis. Baseline: SEE OBJECTIVE DATA Goal status: Ongoing   07/14/2023  Patient tolerates prosthesis wear >90% of awake hours without skin or residual limb pain issues. Baseline: SEE OBJECTIVE DATA Goal status: Ongoing    07/14/2023  Berg Balance >36/56 to indicate lower fall risk Baseline: SEE OBJECTIVE DATA Goal status: Ongoing    07/14/2023  Patient ambulates >300' with prosthesis and LRAD modified independent. Baseline: SEE OBJECTIVE DATA Goal status: Ongoing  07/14/2023  Patient negotiates ramps, curbs & stairs with  single rail with prosthesis  and LRAD modified independent. Baseline: SEE OBJECTIVE DATA Goal status: Ongoing  07/14/2023   Patient reports right knee pain </= 2/10 with standing and gait activities.   Baseline: SEE OBJECTIVE DATA Goal status:   Ongoing   07/14/2023  PLAN:  PT FREQUENCY:  2x/wk  PT DURATION: 90 days / 13 weeks  PLANNED INTERVENTIONS: 97164- PT Re-evaluation, 97110-Therapeutic exercises, 97530- Therapeutic activity,  97112- Neuromuscular re-education, [phone]- Self Care, 60454- Gait training, [phone]- Prosthetic training, Patient/Family education, Balance training, Stair training, DME instructions, and physical performance testing  PLAN FOR NEXT SESSION: Check long-term goals as the end of authorization for insurance.   Lorie Rook, PT, DPT 07/15/2023, 4:46 PM   Date of referral: 12/22/2022 Referring provider: Gearldean Keepers, MD Referring diagnosis? B14.782 (ICD-10-CM) - Hx of AKA (above knee amputation), left  Treatment diagnosis? (if different than referring diagnosis)  Other abnormalities of gait and mobility  ICD-10-CM: R26.89    Unsteadiness on feet  ICD-10-CM: R26.81    Muscle weakness (generalized)  ICD-10-CM: M62.81    Chronic pain of right knee  M25.561 & G89.29   History of falling Z91.81  Abnormal Posture  R29.3   What was this (referring dx) caused by? Surgery (Type: AKA)   Nature of Condition: Initial Onset (within last 3 months) for prosthesis delivery              Laterality: Lt   Current Functional Measure Score: Other 05/12/2023 & 04/21/2023 Randye Buttner Balance 28/56  1/21/2025Randye Buttner Balance 22/56  at eval [facility] was 15/56;     Objective measurements identify impairments when they are compared to normal values, the uninvolved extremity, and prior level of function.             [x]  Yes             []  No   Objective assessment of functional ability: Moderate functional limitations              Briefly describe symptoms: patient requires lots of repetition for education and safety with use of her prosthesis.  Patient has had repetitive falls but these have decreased with PT educating patient.  All of most recent falls have been early in the morning or late in the evening when the prosthesis has been removed.  PT has been addressing those transfers which patient has improved.  Patient has improved her function with a rollator walker but continues to need instruction for prosthetic control and  progression of activities.   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     Average pain intensity:             Last 24 hours: 0/10             Past week: 1/10   How often does the pt experience symptoms? Constantly   How much have the symptoms interfered with usual daily activities? Extremely   How has condition changed since care began at this facility? Better   In general, how is the patients overall health? Very Good     BACK PAIN (STarT Back Screening Tool) No

## 2023-07-20 ENCOUNTER — Encounter: Payer: Self-pay | Admitting: Physical Therapy

## 2023-07-20 ENCOUNTER — Ambulatory Visit (INDEPENDENT_AMBULATORY_CARE_PROVIDER_SITE_OTHER): Admitting: Physical Therapy

## 2023-07-20 DIAGNOSIS — M6281 Muscle weakness (generalized): Secondary | ICD-10-CM

## 2023-07-20 DIAGNOSIS — M25561 Pain in right knee: Secondary | ICD-10-CM

## 2023-07-20 DIAGNOSIS — R2681 Unsteadiness on feet: Secondary | ICD-10-CM | POA: Diagnosis not present

## 2023-07-20 DIAGNOSIS — R2689 Other abnormalities of gait and mobility: Secondary | ICD-10-CM | POA: Diagnosis not present

## 2023-07-20 DIAGNOSIS — G8929 Other chronic pain: Secondary | ICD-10-CM

## 2023-07-20 DIAGNOSIS — R293 Abnormal posture: Secondary | ICD-10-CM

## 2023-07-20 DIAGNOSIS — Z9181 History of falling: Secondary | ICD-10-CM | POA: Diagnosis not present

## 2023-07-20 NOTE — Therapy (Signed)
 OUTPATIENT PHYSICAL THERAPY PROSTHETIC TREATMENT & DISCHARGE SUMMARY   Patient Name: Tammy [name] MRN: [id number] DOB:08-04-1955, 68 y.o., female Today's Date: 07/20/2023  PHYSICAL THERAPY DISCHARGE SUMMARY  Visits from Start of Care: 28  Current functional level related to goals / functional outcomes: See below   Remaining deficits: See below   Education / Equipment: Patient was educated in prosthetic care and HEP which she appears to understand both.  Patient purchased a rollator walker and transport wheelchair.  Patient agrees to discharge. Patient goals were partially met. Patient is being discharged due to insurance authorization limitations.   END OF SESSION:  PT End of Session - 07/20/23 1348     Visit Number 28    Number of Visits 44    Date for PT Re-Evaluation 07/20/23    Authorization Type [employer] Medicare    Authorization Time Period [facility] MEDICARE APPROVED 4 PT VISITS 05/12/23-06/09/2023    Authorization - Visit Number 3    Authorization - Number of Visits 5    Progress Note Due on Visit 27    PT Start Time 1345    PT Stop Time 1423    PT Time Calculation (min) 38 min    Equipment Utilized During Treatment Gait belt    Activity Tolerance Patient tolerated treatment well    Behavior During Therapy WFL for tasks assessed/performed                  Past Medical History:  Diagnosis Date   Anemia    hx of   Anxiety    on meds   Arthritis    back/bilateral ankles   Bipolar disorder (HCC)    Breast cancer (HCC) 2022   LEFT breast-radiation   Depression    on meds   Family history of breast cancer 07/10/2020   Family history of colon cancer 07/10/2020   Family history of ovarian cancer 07/10/2020   GERD (gastroesophageal reflux disease)    on meds   Heart murmur    dx by PCP   Hepatitis C    HTN (hypertension)    on meds   Hx of radiation therapy    Liver cirrhosis (HCC)    from hep c   Neuromuscular disorder (HCC)    Osteopenia     Personal history of radiation therapy    Stroke (HCC) 09/2012   slurred speech and LEFT sided weakness/notes 10/04/2012   Past Surgical History:  Procedure Laterality Date   AMPUTATION Left 11/04/2022   Procedure: LEFT ABOVE KNEE AMPUTATION;  Surgeon: Timothy Ford, MD;  Location: [facility] OR;  Service: Orthopedics;  Laterality: Left;   ANKLE FRACTURE SURGERY Left 2018   APPLICATION OF WOUND VAC Left 06/15/2022   Procedure: APPLICATION OF WOUND VAC;  Surgeon: Thornell Flirt, DO;  Location: [facility];  Service: Plastics;  Laterality: Left;   APPLICATION OF WOUND VAC Left 09/14/2022   Procedure: APPLICATION OF WOUND VAC;  Surgeon: Thornell Flirt, DO;  Location: [facility] OR;  Service: Plastics;  Laterality: Left;   BREAST LUMPECTOMY Left 07/2020   BREAST LUMPECTOMY WITH RADIOACTIVE SEED AND SENTINEL LYMPH NODE BIOPSY Left 07/26/2020   Procedure: LEFT BREAST LUMPECTOMY WITH RADIOACTIVE SEED AND SENTINEL LYMPH NODE BIOPSY;  Surgeon: Oza Blumenthal, MD;  Location: [facility];  Service: General;  Laterality: Left;   CESAREAN SECTION  1986   COLONOSCOPY     DIAGNOSTIC LAPAROSCOPY  1985   ESOPHAGOGASTRODUODENOSCOPY N/A 02/21/2023   Procedure: ESOPHAGOGASTRODUODENOSCOPY (EGD);  Surgeon: Tobin Forts, MD;  Location: [facility];  Service: Gastroenterology;  Laterality: N/A;   HARDWARE REMOVAL Left 04/30/2022   Procedure: HARDWARE REMOVAL LEFT FEMUR;  Surgeon: Adonica Hoose, MD;  Location: [facility];  Service: Orthopedics;  Laterality: Left;  150   HARDWARE REMOVAL Left 11/04/2022   Procedure: HARDWARE REMOVAL INFECTED LEFT TOTAL KNEE ARTHROPLASTY;  Surgeon: Timothy Ford, MD;  Location: [facility] OR;  Service: Orthopedics;  Laterality: Left;   I & D KNEE WITH POLY EXCHANGE Left 06/07/2022   Procedure: IRRIGATION AND DEBRIDEMENT KNEE WITH POLY EXCHANGE;  Surgeon: Adonica Hoose, MD;  Location: [facility];  Service: Orthopedics;  Laterality: Left;   INCISION AND DRAINAGE OF WOUND Left 06/04/2022    Procedure: IRRIGATION AND DEBRIDEMENT KNEE;  Surgeon: Adonica Hoose, MD;  Location: [facility];  Service: Orthopedics;  Laterality: Left;  60   INCISION AND DRAINAGE OF WOUND Left 06/15/2022   Procedure: excision of knee wound with Myriad;  Surgeon: Thornell Flirt, DO;  Location: [facility];  Service: Plastics;  Laterality: Left;   ORIF FEMUR FRACTURE Left 04/10/2021   Procedure: OPEN REDUCTION INTERNAL FIXATION (ORIF) DISTAL FEMUR FRACTURE;  Surgeon: Hardy Lia, MD;  Location: [facility] OR;  Service: Orthopedics;  Laterality: Left;   PARTIAL KNEE ARTHROPLASTY Left 04/30/2022   Procedure: DISTAL FEMUR REPLACEMENT;  Surgeon: Adonica Hoose, MD;  Location: [facility];  Service: Orthopedics;  Laterality: Left;  150   Patient Active Problem List   Diagnosis Date Noted   Acute upper GI bleed 06/12/2023   Melena 06/11/2023   Hypercalcemia 06/11/2023   ABLA (acute blood loss anemia) 06/11/2023   Left femoral vein DVT (HCC) 02/20/2023   GERD (gastroesophageal reflux disease) 02/20/2023   Acute upper gastrointestinal bleeding 02/19/2023   Bipolar 1 disorder, depressed, partial remission (HCC) 11/18/2022   Left above-knee amputee (HCC) 11/13/2022   Infection of total knee replacement (HCC) 11/04/2022   S/P AKA (above knee amputation) unilateral, left (HCC) 11/04/2022   Chronic hepatitis C with cirrhosis (HCC) 06/06/2022   Surgical wound dehiscence, initial encounter 06/04/2022   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, left, with nonunion, subsequent encounter 04/30/2022   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 04/30/2022   Hypokalemia 04/16/2021   Hip fracture requiring operative repair (HCC) 04/16/2021   Vitamin D  deficiency 04/12/2021   Hyponatremia 04/11/2021   Acute postoperative anemia due to expected blood loss 04/11/2021   Class 1 obesity 04/11/2021   Left comminuted and displaced fracture of the distal femur metaphysis  04/08/2021   History of CVA (cerebrovascular accident) 04/08/2021   Other cirrhosis of liver (HCC) 01/01/2021   Genetic testing 07/17/2020   Family history of breast cancer 07/10/2020   Family history of ovarian cancer 07/10/2020   Family history of colon cancer 07/10/2020   Malignant neoplasm of upper-outer quadrant of left breast in female, estrogen receptor positive (HCC) 07/09/2020   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   Chronic pain syndrome 10/06/2012   Nicotine  dependence 10/06/2012   Anxiety state 10/05/2012   Bipolar disorder, unspecified (HCC) 10/05/2012   Slurred speech 10/04/2012   Left-sided weakness 10/04/2012   Hyperkalemia 10/04/2012   Acute CVA (cerebrovascular accident) (HCC) 10/04/2012   Essential hypertension     PCP: Wright Heal, MD  REFERRING PROVIDER: Gearldean Keepers, MD  ONSET DATE: 01/25/2023 prosthesis delivery  REFERRING DIAG: W09.811 (ICD-10-CM) - Hx of AKA (above knee amputation), left   THERAPY DIAG:  Other abnormalities of gait and mobility  History of falling  Unsteadiness on feet  Muscle weakness (generalized)  Abnormal posture  Chronic pain of right knee  Rationale for Evaluation and Treatment: Rehabilitation  SUBJECTIVE:   SUBJECTIVE STATEMENT: She has been doing her balance exercises in corner.    Pt accompanied by: Royston Cornea, significant other  PERTINENT HISTORY: AKA, anemia, anxiety, arthritis, Bipolar disorder, Breast CA left with radiation, HTN, Hep C, liver cirrhosis, Neuromuscular disorder, osteopenia, stroke Left hemiparesis 2014  PAIN:  Are you having pain? No pain Pain location: residual limb Pain description: soreness Aggravating factors: standing or walking too much, or twisting motion Relieving factors: resting  Patient reports no right knee pain at time of PT discharge on 07/20/2023  PRECAUTIONS: Fall  WEIGHT BEARING RESTRICTIONS: No  FALLS: Has patient fallen in last 6 months? Yes. Number of falls 1 no  injuries  LIVING ENVIRONMENT: Lives with: lives with their spouse Lives in: Mobile home Home Access: Ramped entrance or 4 steps with double rail to [employer] layout: One level Has following equipment at home: Single point cane, Environmental consultant - 2 wheeled, Wheelchair (manual), Shower bench, bed side commode, Grab bars, and Ramped entry  OCCUPATION: retired from nursing  PLOF: Independent prior to Fracture 04/10/2021.  Since fracture was able to get to cane for community until hardware broke.    PATIENT GOALS:  walk independently in community, travel, drive, take care of 4yo great granddaughter  OBJECTIVE:  COGNITION: Evaluation on 01/25/2023:  Overall cognitive status: Within functional limits for tasks assessed  POSTURE:  04/21/2023:  standing with rounded shoulders, forward head, flexed trunk , and weight shift right  Evaluation on 01/25/2023: rounded shoulders, forward head, flexed trunk , and weight shift right  LOWER EXTREMITY ROM:  ROM P:passive  A:active Right eval Left eval  Hip flexion    Hip extension  12deg  Hip abduction    Hip adduction    Hip internal rotation    Hip external rotation    Knee flexion    Knee extension    Ankle dorsiflexion    Ankle plantarflexion    Ankle inversion    Ankle eversion     (Blank rows = not tested)  LOWER EXTREMITY MMT:  MMT Right eval Left eval  Hip flexion    Hip extension    Hip abduction    Hip adduction    Hip internal rotation    Hip external rotation    Knee flexion    Knee extension    Ankle dorsiflexion    Ankle plantarflexion    Ankle inversion    Ankle eversion    At Evaluation all strength testing is grossly seated and functionally standing / gait. (Blank rows = not tested)  TRANSFERS: 04/21/2023:  Sit to stand: SBA w/c to RW requires without armrests pushing off w/c seat and stabilization without UE support but RW close for safety.   Stand to sit: SBA RW to position feet, then able to sit down using  armrests on w/c to control descent.  Evaluation on 01/25/2023:  Sit to stand: SBA w/c to RW requires armrests on w/c and stabilization with RW.   Stand to sit: SBA RW to w/c requires armrests on w/c and stabilization with RW.    FUNCTIONAL TESTs:  07/20/2023:  Randye Buttner Balance 32/56  [facility] PT Assessment - 07/20/23 1345       Standardized Balance Assessment   Standardized Balance Assessment Berg Balance Test      Berg Balance Test   Sit to  Stand Able to stand  independently using hands    Standing Unsupported Able to stand safely 2 minutes    Sitting with Back Unsupported but Feet Supported on Floor or Stool Able to sit safely and securely 2 minutes    Stand to Sit Controls descent by using hands    Transfers Able to transfer safely, minor use of hands    Standing Unsupported with Eyes Closed Able to stand 3 seconds    Standing Unsupported with Feet Together Able to place feet together independently and stand for 1 minute with supervision    From Standing, Reach Forward with Outstretched Arm Can reach forward >12 cm safely (5")    From Standing Position, Pick up Object from Floor Able to pick up shoe, needs supervision    From Standing Position, Turn to Look Behind Over each Shoulder Turn sideways only but maintains balance    Turn 360 Degrees Needs assistance while turning    Standing Unsupported, Alternately Place Feet on Step/Stool Needs assistance to keep from falling or unable to try    Standing Unsupported, One Foot in Front Needs help to step but can hold 15 seconds    Standing on One Leg Unable to try or needs assist to prevent fall    Total Score 32    Berg comment: Group Information    BERG  < 36 high risk for falls (close to 100%) 46-51 moderate (>50%)   37-45 significant (>80%) 52-55 lower (> 25%)            05/12/2023: Berg balance 28/56  04/21/2023: TUG with RW: 58.28sec Berg balance: 28/56   03/02/2023: Randye Buttner Balance 22/56  Eval: Berg Balance Scale:  15/56   GAIT: 07/20/2023: Pt amb 200' with rollator walker safely modified independent.  Pt neg ramp & curb with rollator walker safely with supervision.   07/14/2023:  Gait velocity with rollator walker 1.02 ft/sec   05/10/2023:    Max tolerable distance: Patient ambulated 155' with rolling walker with close supervision.  04/20/2023: gait velocity with RW: 0.51 ft/sec Pt able to amb with RW & Transfemoral prosthesis with supervision / requiring verbal cues for gait deviations.    03/18/2023: -Patient ambulated 17ft and performing a 180deg turn CGA for patient safety with intermittent SBA  01/26/2023: Patient ambulated 10 feet with rolling walker with minA but required constant cueing for technique and safety.  PALPATION: 03/18/2023: patient verbalized seeing areas of discoloration on residual limb a few days prior. Therapist palpation and examination of area, did not see any noticeable areas with patient stating that the areas must have gone away without issue.    CURRENT PROSTHETIC WEAR ASSESSMENT: 07/20/2023: Patient is independent with: skin check, residual limb care, care of non-amputated limb, prosthetic cleaning, ply sock cleaning, correct ply sock adjustment, proper wear schedule/adjustment, and proper weight-bearing schedule/adjustment Donning prosthesis: modified independent Doffing prosthesis: modified independent Prosthetic wear tolerance: tolerated wear of prosthesis >90% of awake hours.   04/21/2023:  Patient is independent with: skin check, residual limb care, care of non-amputated limb, prosthetic cleaning, ply sock cleaning, correct ply sock adjustment, proper wear schedule/adjustment, and proper weight-bearing schedule/adjustment Donning prosthesis: modified independent Doffing prosthesis: modified independent Prosthetic wear tolerance: tolerated wear of prosthesis daily 6-7 hours but has decreased over last few days due to limb pain with recent fall.  Prosthetic weight  bearing: pt tolerated standing 10 min for Berg Balance with partial weight on prosthesis.   Evaluation on 01/25/2023:  Patient is dependent with: skin  check, residual limb care, care of non-amputated limb, prosthetic cleaning, ply sock cleaning, correct ply sock adjustment, proper wear schedule/adjustment, and proper weight-bearing schedule/adjustment Donning prosthesis: Min A Doffing prosthesis: SBA Prosthetic wear tolerance: tolerated wear 1 hour prior to PT evaluation.  Prosthetic weight bearing tolerance: 5 minutes with BUE support on RW with limited wear on prosthesis.  Edema: pitting Prosthetic description: Ischial Containment socket with flexible inner socket, silicon liner with velcro strap suspension.    TODAY'S  TREATMENT:                                                                                                                             DATE:  07/20/2023: Prosthetic Training with Transfemoral Prosthesis: See Objective Data  Patient was sitting on seat of her rollator walker in the waiting room.  She was able to stand up and turn to face rollator walker but PT noted direction of turn increased risk of prosthetic knee instability.  PT demo and verbal cues that turning over her left shoulder counterclockwise facilitates prosthetic knee extension with little patient functional assistance.  And turning over her right shoulder clockwise makes patient have to actively extend prosthetic knee for stabilization.  Patient return demonstration understanding turning both directions with improved safety turning counterclockwise over her left shoulder.    TREATMENT:                                                                                                                             DATE:  07/14/2023: Prosthetic Training with Transfemoral Prosthesis: Pre-gait in //bars using mirror, PT demo & verbal cues on step width with prosthetic knee outset to facilitate hip stability in stance and  weight shift over the prosthesis in stance by focusing on right heel rolling up prior to stepping the right foot forward.  Patient improved with PT instruction and repetition.  Patient verbalized understanding the need to do this with gait with a walker in order to create that hip stability.  Neuromuscular reeducation: Standing balance activities (prosthetic toes 2 inches posterior to sound foot toes to facilitate prosthetic knee stability and stationary stance)  On floor eyes open head movements right/left, up/down and diagonals with only 2 light touch is for stability On floor eyes closed head movements right/left, up/down and diagonals with intermittent touch. PT demo and verbal cues on how to perform this HEP in corner with a chair back in front of  her including how to get in position.  Patient and fianc verbalized understanding. Standing with posterior pelvis against doorframe with feet under pelvis and facilitating shoulders over hips for upright posture.  Reaching single upper extremity overhead for 2 deep breaths 2 reps each and bilateral upper extremities overhead for 2 deep breaths 2 reps.  PT recommending doing this during the day when exiting the bathroom to work on upright posture.  Patient verbalized understanding.     TREATMENT:                                                                                                                             DATE:  07/14/2023: Prosthetic Training with Transfemoral Prosthesis: PT demo & verbal cues on donning liner & prosthesis with suction ring suspension.  Also use of release button to increase air exchange only with weight bearing. She should press this button when standing up after sitting for a while.  Her shorts can not be under the liner or inside socket, this will make going to bathroom difficult.   Pt reviewed foot position for stationary activities to facilitate prosthetic knee stability. PT also reviewed need to use folded towel or  pillow to level her pelvis in sitting.  Pt & fiance verbalized understanding.   Pt amb 150' with rollator walker with visual cue on rollator seat for step width.  Pt was adducting prosthetic foot which causes hip instability in stance.   Pt neg 6.5" curb with rollator walker with CGA & verbal cues.       HOME EXERCISE PROGRAM: Access Code: [email or web address] URL: [email or web address] Date: 04/12/2023 Prepared by: Lorie Rook   Exercises - Seated Table Hamstring Stretch  - 1-3 x daily - 7 x weekly - 1 sets - 2-3 reps - 20-30 seconds hold - Modified Thomas Stretch  - 2 x daily - 7 x weekly - 1 sets - 2-3 reps - 30 seconds hold - Hip Flexor Stretch at [place] of Bed  - 2 x daily - 7 x weekly - 1 sets - 2-3 reps - 30 seconds hold - Standing posture with back to counter  - 2 x daily - 7 x weekly - 1 sets - 1 reps - 5-10 minutes hold - Upright Stance at Door Frame Single Arm  - 3-6 x daily - 7 x weekly - 1 sets - 2 reps - 2 deep breathes hold - Upright Stance at Door Frame with Both Arms  - 3-6 x daily - 7 x weekly - 1 sets - 2 reps - 2 deep breathes hold [facility]  Patient Portal      Do each exercise 1-2  times per day Do each exercise 5-10 repetitions Hold each exercise for 2 seconds to feel your location  AT SINK FIND YOUR MIDLINE POSITION AND PLACE FEET EQUAL DISTANCE FROM THE MIDLINE.  Try to find this position when standing still for activities.   USE TAPE ON FLOOR TO MARK  THE MIDLINE POSITION which is even with middle of sink.  You also should try to feel with your limb pressure in socket.  You are trying to feel with limb what you used to feel with the bottom of your foot.  Side to Side Shift: Moving your hips only (not shoulders): move weight onto your left leg, HOLD/FEEL pressure in socket.  Move back to equal weight on each leg, HOLD/FEEL pressure in socket. Move weight onto your right leg, HOLD/FEEL pressure in socket. Move back to equal weight on each leg, HOLD/FEEL  pressure in socket. Repeat.  Start with both hands on sink, progress to hand on prosthetic side only, then no hands.  Front to Back Shift: Moving your hips only (not shoulders): move your weight forward onto your toes, HOLD/FEEL pressure in socket. Move your weight back to equal Flat Foot on both legs, HOLD/FEEL  pressure in socket. Move your weight back onto your heels, HOLD/FEEL  pressure in socket. Move your weight back to equal on both legs, HOLD/FEEL  pressure in socket. Repeat.  Start with both hands on sink, progress to hand on prosthetic side only, then no hands.  Moving Cones / Cups: With equal weight on each leg: Hold on with one hand the first time, then progress to no hand supports. Move cups from one side of sink to the other. Place cups ~2" out of your reach, progress to 10" beyond reach.  Place one hand in middle of sink and reach with other hand. Do both arms.  Then hover one hand and move cups with other hand.  Overhead/Upward Reaching: alternated reaching up to top cabinets or ceiling if no cabinets present. Keep equal weight on each leg. Start with one hand support on counter while other hand reaches and progress to no hand support with reaching.  ace one hand in middle of sink and reach with other hand. Do both arms.  Then hover one hand and move cups with other hand.  5.   Looking Over Shoulders: With equal weight on each leg: alternate turning to look over your shoulders with one hand support on counter as needed.  Start with head motions only to look in front of shoulder, then even with shoulder and progress to looking behind you. To look to side, move head /eyes, then shoulder on side looking pulls back, shift more weight to side looking and pull hip back. Place one hand in middle of sink and let go with other hand so your shoulder can pull back. Switch hands to look other way.   Then hover one hand and look over shoulder. If looking right, use left hand at sink. If looking left, use  right hand at sink. 6.  Stepping with leg that is not amputated:  Move items under cabinet out of your way. Shift your hips/pelvis so weight on prosthesis. Tighten muscles in hip on prosthetic side.  SLOWLY step other leg so front of foot is in cabinet. Then step back to floor.    ASSESSMENT:  CLINICAL IMPRESSION: Patient improved her mobility significantly with her prosthesis with PT instruction.  She is dependent on rollator walker for safety.  Her balance improved so that she can perform standing ADLs without constant upper extremity support.  However Berg balance does place her at high risk of falls still so PT is recommending that there is always support close that she can touch to stabilize though she does not need constant support at this point.  Patient appears  to understand PT recommendations.  Patient is wearing prosthesis most of awake hours without difficulty.  However her socket is too large due to shrinkage over the 6 months that she has had this prosthetic socket and she would greatly benefit from a socket revision.  OBJECTIVE IMPAIRMENTS: Abnormal gait, decreased activity tolerance, decreased balance, decreased endurance, decreased knowledge of condition, decreased knowledge of use of DME, decreased mobility, difficulty walking, decreased ROM, decreased strength, increased edema, impaired flexibility, postural dysfunction, prosthetic dependency , obesity, and pain.   ACTIVITY LIMITATIONS: carrying, lifting, bending, sitting, standing, stairs, transfers, and locomotion level  PARTICIPATION LIMITATIONS: meal prep, cleaning, and community activity  PERSONAL FACTORS: Fitness, Time since onset of injury/illness/exacerbation, and 3+ comorbidities: see PMH are also affecting patient's functional outcome.   REHAB POTENTIAL: Good  CLINICAL DECISION MAKING: Evolving/moderate complexity  EVALUATION COMPLEXITY: Moderate   GOALS: Goals reviewed with patient? Yes  SHORT TERM GOALS:  Target date: 05/20/2023  Patient reports no falls for 2 week time period.  Baseline: SEE OBJECTIVE DATA Goal status: MET 05/17/2023 2.  Patient reports tolerating prosthesis >12 hrs total /day without skin issues or limb pain after standing. Baseline: SEE OBJECTIVE DATA Goal status: Met    07/14/2023  3.  Berg Balance >/= 32/56 Baseline: SEE OBJECTIVE DATA Goal status: Met   07/20/2023  4. Patient ambulates 150' with RW & prosthesis with supervision / verbal cues only for gait deviations. Baseline: SEE OBJECTIVE DATA Goal status: Met 07/14/2023  5. Patient negotiates ramps & curbs with RW & prosthesis with supervision. Baseline: SEE OBJECTIVE DATA Goal status: MET  05/17/2023   UPDATED LONG TERM GOALS: Target date: 07/20/2023  Patient demonstrates & verbalized understanding of prosthetic care to enable safe utilization of prosthesis. Baseline: SEE OBJECTIVE DATA Goal status: Met 07/20/2023  Patient tolerates prosthesis wear >90% of awake hours without skin or residual limb pain issues. Baseline: SEE OBJECTIVE DATA Goal status: Met 07/20/2023  Berg Balance >36/56 to indicate lower fall risk Baseline: SEE OBJECTIVE DATA Goal status: Progress to 32/56 but not fully met 07/20/2023  Patient ambulates >300' with prosthesis and LRAD modified independent. Baseline: SEE OBJECTIVE DATA -patient reports able to ambulate up to 300' with her rollator walker but her fianc provides supervision. Goal status: Not met 07/20/2023  Patient negotiates ramps, curbs & stairs with single rail with prosthesis  and LRAD modified independent. Baseline: SEE OBJECTIVE DATA patient requires supervision to negotiate ramp and curb with rollator walker. Goal status: Progress but not met 07/20/2023   Patient reports right knee pain </= 2/10 with standing and gait activities.   Baseline: SEE OBJECTIVE DATA Goal status:   Met 07/20/2023  PLAN:  PT FREQUENCY:  2x/wk  PT DURATION: 90 days / 13 weeks  PLANNED  INTERVENTIONS: 97164- PT Re-evaluation, 97110-Therapeutic exercises, 97530- Therapeutic activity, 97112- Neuromuscular re-education, [phone]- Self Care, 19147- Gait training, [phone]- Prosthetic training, Patient/Family education, Balance training, Stair training, DME instructions, and physical performance testing  PLAN FOR NEXT SESSION: Discharge PT   Lorie Rook, PT, DPT 07/20/2023, 5:20 PM   Date of referral: 12/22/2022 Referring provider: Gearldean Keepers, MD Referring diagnosis? O13.086 (ICD-10-CM) - Hx of AKA (above knee amputation), left  Treatment diagnosis? (if different than referring diagnosis)  Other abnormalities of gait and mobility  ICD-10-CM: R26.89    Unsteadiness on feet  ICD-10-CM: R26.81    Muscle weakness (generalized)  ICD-10-CM: M62.81    Chronic pain of right knee  M25.561 & G89.29   History of falling Z91.81  Abnormal  Posture  R29.3   What was this (referring dx) caused by? Surgery (Type: AKA)   Nature of Condition: Initial Onset (within last 3 months) for prosthesis delivery              Laterality: Lt   Current Functional Measure Score: Other 05/12/2023 & 04/21/2023 Randye Buttner Balance 28/56  1/21/2025Randye Buttner Balance 22/56  at eval Berg was 15/56;     Objective measurements identify impairments when they are compared to normal values, the uninvolved extremity, and prior level of function.             [x]  Yes             []  No   Objective assessment of functional ability: Moderate functional limitations              Briefly describe symptoms: patient requires lots of repetition for education and safety with use of her prosthesis.  Patient has had repetitive falls but these have decreased with PT educating patient.  All of most recent falls have been early in the morning or late in the evening when the prosthesis has been removed.  PT has been addressing those transfers which patient has improved.  Patient has improved her function with a rollator walker but continues to  need instruction for prosthetic control and progression of activities.   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     Average pain intensity:             Last 24 hours: 0/10             Past week: 1/10   How often does the pt experience symptoms? Constantly   How much have the symptoms interfered with usual daily activities? Extremely   How has condition changed since care began at this facility? Better   In general, how is the patients overall health? Very Good     BACK PAIN (STarT Back Screening Tool) No

## 2023-07-27 ENCOUNTER — Encounter: Admitting: Physical Therapy

## 2023-08-02 ENCOUNTER — Encounter: Admitting: Physical Therapy

## 2023-08-03 ENCOUNTER — Other Ambulatory Visit: Payer: Self-pay | Admitting: Nurse Practitioner

## 2023-08-03 DIAGNOSIS — K7469 Other cirrhosis of liver: Secondary | ICD-10-CM

## 2023-08-10 ENCOUNTER — Other Ambulatory Visit

## 2023-08-10 ENCOUNTER — Other Ambulatory Visit: Payer: Self-pay | Admitting: Nurse Practitioner

## 2023-08-10 ENCOUNTER — Telehealth: Payer: Self-pay

## 2023-08-10 ENCOUNTER — Other Ambulatory Visit: Payer: Self-pay

## 2023-08-10 MED ORDER — GABAPENTIN 300 MG PO CAPS: 300.0000 mg | ORAL_CAPSULE | Freq: Every day | ORAL | 1 refills | Status: DC

## 2023-08-10 NOTE — Telephone Encounter (Signed)
 Pt called stating she would prefer a refill for Gabapentin  300mg  tab instead of the 100mg  tabs.  Pt stated she's taking 3 ea of the 100mg  tabs at bedtime and would prefer to just take 1ea 300mg  tab instead.  Pt has a prescription for both and would like the refill to be sent in on the 300mg  to her preferred pharmacy on file.  Stated this nurse will notify Dr. Lanny and her Team.

## 2023-08-26 ENCOUNTER — Ambulatory Visit (INDEPENDENT_AMBULATORY_CARE_PROVIDER_SITE_OTHER): Admitting: Orthopedic Surgery

## 2023-08-26 DIAGNOSIS — Z89612 Acquired absence of left leg above knee: Secondary | ICD-10-CM

## 2023-08-29 ENCOUNTER — Encounter: Payer: Self-pay | Admitting: Orthopedic Surgery

## 2023-08-29 NOTE — Progress Notes (Addendum)
 Office Visit Note   Patient: Tammy [name]           Date of Birth: 1955-12-10           MRN: [id number] Visit Date: 08/26/2023              Requested by: Norval Kettle, MD [address] [place],  [place] [address] PCP: Norval Kettle, MD  Chief Complaint  Patient presents with   Left Leg - Follow-up      HPI: Patient is a 68 year old woman with a left above-knee amputation.  Patient has significant loss of residual volume and is in bearing in her prosthesis.  She has completed physical therapy.  Assessment & Plan: Visit Diagnoses:  1. Hx of AKA (above knee amputation), left (HCC)     Plan: Prescription was provided for new socket and liner on the left.  Follow-Up Instructions: No follow-ups on file.   Ortho Exam  Patient is alert, oriented, no adenopathy, well-dressed, normal affect, normal respiratory effort. Examination patient has no open ulcers no cellulitis she has significant loss and residual volume.  Patient is an existing left transfemoral amputee.  Patient's current comorbidities are not expected to impact the ability to function with the prescribed prosthesis. Patient verbally communicates a strong desire to use a prosthesis. Patient currently requires mobility aids to ambulate without a prosthesis.  Expects not to use mobility aids with a new prosthesis. Patient is expected to resume or reach their K Level within 6 months. Patient was active before the amputation and independent with stairs, uneven terrain, varying cadence, and a community ambulator.  Patient is a K3 level ambulator that spends a lot of time walking around on uneven terrain over obstacles, up and down stairs, and ambulates with a variable cadence.  The patient will benefit from an MPK knee because they frequently encounter uneven terrain, stairs, and sometimes have to walk backwards. The stumble recovery and intuitive stance features will reduce fall risk and allow the  patient to walk down stairs step over step.        Imaging: No results found. No images are attached to the encounter.  Labs: Lab Results  Component Value Date   HGBA1C 5.6 03/17/2022   HGBA1C 5.6 12/01/2018   HGBA1C 5.9 (H) 05/11/2015   ESRSEDRATE 38 (H) 03/17/2022   CRP 9.6 (H) 11/03/2022   CRP 68.5 (H) 09/22/2022   CRP 47.9 (H) 08/25/2022   REPTSTATUS 11/09/2022 FINAL 11/04/2022   GRAMSTAIN NO WBC SEEN NO ORGANISMS SEEN  11/04/2022   CULT  11/04/2022    No growth aerobically or anaerobically. Performed at [facility], [address] [place], [place] [address]    [facility] ENTEROCOCCUS FAECALIS 06/07/2022     Lab Results  Component Value Date   ALBUMIN 3.5 06/12/2023   ALBUMIN 4.2 06/11/2023   ALBUMIN 3.2 (L) 02/21/2023    Lab Results  Component Value Date   MG 1.7 06/11/2023   MG 2.0 02/21/2023   MG 1.7 02/20/2023   Lab Results  Component Value Date   VD25OH 10.88 (L) 04/11/2021    No results found for: PREALBUMIN    Latest Ref Rng & Units 06/12/2023    4:09 AM 06/11/2023    8:33 PM 06/11/2023    2:23 PM  CBC EXTENDED  WBC 4.0 - 10.5 K/uL 5.4     RBC 3.87 - 5.11 MIL/uL 3.62     Hemoglobin 12.0 - 15.0 g/dL 89.7  89.7  11.4   HCT 36.0 - 46.0 % 32.4  32.6  36.1   Platelets 150 - 400 K/uL 242        There is no height or weight on file to calculate BMI.  Orders:  No orders of the defined types were placed in this encounter.  No orders of the defined types were placed in this encounter.    Procedures: No procedures performed  Clinical Data: No additional findings.  ROS:  All other systems negative, except as noted in the HPI. Review of Systems  Objective: Vital Signs: There were no vitals taken for this visit.  Specialty Comments:  No specialty comments available.  PMFS History: Patient Active Problem List   Diagnosis Date Noted   Acute upper GI bleed 06/12/2023   Melena 06/11/2023   Hypercalcemia 06/11/2023   ABLA  (acute blood loss anemia) 06/11/2023   Left femoral vein DVT (HCC) 02/20/2023   GERD (gastroesophageal reflux disease) 02/20/2023   Acute upper gastrointestinal bleeding 02/19/2023   Bipolar 1 disorder, depressed, partial remission (HCC) 11/18/2022   Left above-knee amputee (HCC) 11/13/2022   Infection of total knee replacement (HCC) 11/04/2022   S/P AKA (above knee amputation) unilateral, left (HCC) 11/04/2022   Chronic hepatitis C with cirrhosis (HCC) 06/06/2022   Surgical wound dehiscence, initial encounter 06/04/2022   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, left, with nonunion, subsequent encounter 04/30/2022   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 04/30/2022   Hypokalemia 04/16/2021   Hip fracture requiring operative repair (HCC) 04/16/2021   Vitamin D  deficiency 04/12/2021   Hyponatremia 04/11/2021   Acute postoperative anemia due to expected blood loss 04/11/2021   Class 1 obesity 04/11/2021   Left comminuted and displaced fracture of the distal femur metaphysis 04/08/2021   History of CVA (cerebrovascular accident) 04/08/2021   Other cirrhosis of liver (HCC) 01/01/2021   Genetic testing 07/17/2020   Family history of breast cancer 07/10/2020   Family history of ovarian cancer 07/10/2020   Family history of colon cancer 07/10/2020   Malignant neoplasm of upper-outer quadrant of left breast in female, estrogen receptor positive (HCC) 07/09/2020   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   Chronic pain syndrome 10/06/2012   Nicotine  dependence 10/06/2012   Anxiety state 10/05/2012   Bipolar disorder, unspecified (HCC) 10/05/2012   Slurred speech 10/04/2012   Left-sided weakness 10/04/2012   Hyperkalemia 10/04/2012   Acute CVA (cerebrovascular accident) (HCC) 10/04/2012   Essential hypertension    Past Medical History:  Diagnosis Date   Anemia    hx of   Anxiety    on meds   Arthritis     back/bilateral ankles   Bipolar disorder (HCC)    Breast cancer (HCC) 2022   LEFT breast-radiation   Depression    on meds   Family history of breast cancer 07/10/2020   Family history of colon cancer 07/10/2020   Family history of ovarian cancer 07/10/2020   GERD (gastroesophageal reflux disease)    on meds   Heart murmur    dx by PCP   Hepatitis C    HTN (hypertension)    on meds   Hx of radiation therapy    Liver cirrhosis (HCC)    from hep c   Neuromuscular disorder (HCC)    Osteopenia    Personal history of radiation therapy    Stroke (HCC) 09/2012   slurred speech and LEFT sided weakness/notes 10/04/2012    Family History  Problem  Relation Age of Onset   Breast cancer Mother    Other Mother        blood clots   Diabetes Sister    Neuropathy Sister        diabetic neuropathy   Breast cancer Maternal Aunt        53   Colon cancer Maternal Aunt 48   Diabetes Paternal Aunt    Diabetes Brother    Neuropathy Brother        diabetic neuropathy   Breast cancer Other        PGM's sister; dx mid 27s   Colon cancer Other        PGM's brother; dx 62s   Ovarian cancer Other        PGM's mother; dx unknown age   Esophageal cancer Neg Hx    Rectal cancer Neg Hx    Stomach cancer Neg Hx    Colon polyps Neg Hx     Past Surgical History:  Procedure Laterality Date   AMPUTATION Left 11/04/2022   Procedure: LEFT ABOVE KNEE AMPUTATION;  Surgeon: Harden Jerona GAILS, MD;  Location: [facility];  Service: Orthopedics;  Laterality: Left;   ANKLE FRACTURE SURGERY Left 2018   APPLICATION OF WOUND VAC Left 06/15/2022   Procedure: APPLICATION OF WOUND VAC;  Surgeon: Lowery Estefana RAMAN, DO;  Location: [facility];  Service: Plastics;  Laterality: Left;   APPLICATION OF WOUND VAC Left 09/14/2022   Procedure: APPLICATION OF WOUND VAC;  Surgeon: Lowery Estefana RAMAN, DO;  Location: [facility];  Service: Plastics;  Laterality: Left;   BREAST LUMPECTOMY Left 07/2020   BREAST LUMPECTOMY WITH RADIOACTIVE  SEED AND SENTINEL LYMPH NODE BIOPSY Left 07/26/2020   Procedure: LEFT BREAST LUMPECTOMY WITH RADIOACTIVE SEED AND SENTINEL LYMPH NODE BIOPSY;  Surgeon: Vernetta Berg, MD;  Location: [facility];  Service: General;  Laterality: Left;   CESAREAN SECTION  1986   COLONOSCOPY     DIAGNOSTIC LAPAROSCOPY  1985   ESOPHAGOGASTRODUODENOSCOPY N/A 02/21/2023   Procedure: ESOPHAGOGASTRODUODENOSCOPY (EGD);  Surgeon: Abran Norleen SAILOR, MD;  Location: [facility];  Service: Gastroenterology;  Laterality: N/A;   HARDWARE REMOVAL Left 04/30/2022   Procedure: HARDWARE REMOVAL LEFT FEMUR;  Surgeon: Fidel Rogue, MD;  Location: [facility];  Service: Orthopedics;  Laterality: Left;  150   HARDWARE REMOVAL Left 11/04/2022   Procedure: HARDWARE REMOVAL INFECTED LEFT TOTAL KNEE ARTHROPLASTY;  Surgeon: Harden Jerona GAILS, MD;  [facility];  Service: Orthopedics;  Laterality: Left;   I & D KNEE WITH POLY EXCHANGE Left 06/07/2022   Procedure: IRRIGATION AND DEBRIDEMENT KNEE WITH POLY EXCHANGE;  Surgeon: Fidel Rogue, MD;  Location: [facility];  Service: Orthopedics;  Laterality: Left;   INCISION AND DRAINAGE OF WOUND Left 06/04/2022   Procedure: IRRIGATION AND DEBRIDEMENT KNEE;  Surgeon: Fidel Rogue, MD;  Location: [facility];  Service: Orthopedics;  Laterality: Left;  60   INCISION AND DRAINAGE OF WOUND Left 06/15/2022   Procedure: excision of knee wound with Myriad;  Surgeon: Lowery Estefana RAMAN, DO;  Location: [facility];  Service: Plastics;  Laterality: Left;   ORIF FEMUR FRACTURE Left 04/10/2021   Procedure: OPEN REDUCTION INTERNAL FIXATION (ORIF) DISTAL FEMUR FRACTURE;  Surgeon: Celena Sharper, MD;  Location: [facility];  Service: Orthopedics;  Laterality: Left;   PARTIAL KNEE ARTHROPLASTY Left 04/30/2022   Procedure: DISTAL FEMUR REPLACEMENT;  Surgeon: Fidel Rogue, MD;  Location: [facility];  Service: Orthopedics;  Laterality: Left;  150   Social History  Occupational History   Occupation: diabled  Tobacco  Use   Smoking status: Former    Current packs/day: 1.50    Average packs/day: 1.5 packs/day for 40.0 years (60.0 ttl pk-yrs)    Types: Cigarettes   Smokeless tobacco: Never   Tobacco comments:    has used e-cigarettes, no vaping   Vaping Use   Vaping status: Never Used  Substance and Sexual Activity   Alcohol  use: Not Currently   Drug use: No   Sexual activity: Not Currently

## 2023-10-05 NOTE — Addendum Note (Signed)
 Addended by: HARDEN LAME on: 10/05/2023 08:36 AM   Modules accepted: Orders

## 2023-10-07 ENCOUNTER — Encounter: Payer: Medicare Other | Attending: Physical Medicine & Rehabilitation | Admitting: Physical Medicine & Rehabilitation

## 2023-10-18 ENCOUNTER — Ambulatory Visit

## 2023-10-18 ENCOUNTER — Ambulatory Visit
Admission: RE | Admit: 2023-10-18 | Discharge: 2023-10-18 | Disposition: A | Discharge status: Home / Self Care | Source: Ambulatory Visit | Attending: Hematology

## 2023-10-18 ENCOUNTER — Ambulatory Visit (INDEPENDENT_AMBULATORY_CARE_PROVIDER_SITE_OTHER): Admitting: Physical Therapy

## 2023-10-18 ENCOUNTER — Encounter: Payer: Self-pay | Admitting: Physical Therapy

## 2023-10-18 DIAGNOSIS — R2681 Unsteadiness on feet: Secondary | ICD-10-CM | POA: Diagnosis not present

## 2023-10-18 DIAGNOSIS — R2689 Other abnormalities of gait and mobility: Secondary | ICD-10-CM | POA: Diagnosis not present

## 2023-10-18 DIAGNOSIS — Z1231 Encounter for screening mammogram for malignant neoplasm of breast: Secondary | ICD-10-CM

## 2023-10-18 NOTE — Therapy (Signed)
 OUTPATIENT PHYSICAL THERAPY PROSTHETICS EVALUATION   Patient Name: Tammy [name] MRN: [id number] DOB:09-Jul-1955, 68 y.o., female Today's Date: 10/18/2023  END OF SESSION:  PT End of Session - 10/18/23 1505     Visit Number 1    Number of Visits 2    Date for PT Re-Evaluation 11/04/23    Authorization Type [employer]    Authorization Time Period $20 copay    PT Start Time 1309    PT Stop Time 1345    PT Time Calculation (min) 36 min    Equipment Utilized During Treatment Gait belt    Activity Tolerance Patient tolerated treatment well    Behavior During Therapy WFL for tasks assessed/performed          Past Medical History:  Diagnosis Date   Anemia    hx of   Anxiety    on meds   Arthritis    back/bilateral ankles   Bipolar disorder (HCC)    Breast cancer (HCC) 2022   LEFT breast-radiation   Depression    on meds   Family history of breast cancer 07/10/2020   Family history of colon cancer 07/10/2020   Family history of ovarian cancer 07/10/2020   GERD (gastroesophageal reflux disease)    on meds   Heart murmur    dx by PCP   Hepatitis C    HTN (hypertension)    on meds   Hx of radiation therapy    Liver cirrhosis (HCC)    from hep c   Neuromuscular disorder (HCC)    Osteopenia    Personal history of radiation therapy    Stroke (HCC) 09/2012   slurred speech and LEFT sided weakness/notes 10/04/2012   Past Surgical History:  Procedure Laterality Date   AMPUTATION Left 11/04/2022   Procedure: LEFT ABOVE KNEE AMPUTATION;  Surgeon: Harden Jerona GAILS, MD;  Location: [facility] OR;  Service: Orthopedics;  Laterality: Left;   ANKLE FRACTURE SURGERY Left 2018   APPLICATION OF WOUND VAC Left 06/15/2022   Procedure: APPLICATION OF WOUND VAC;  Surgeon: Lowery Estefana RAMAN, DO;  Location: [facility];  Service: Plastics;  Laterality: Left;   APPLICATION OF WOUND VAC Left 09/14/2022   Procedure: APPLICATION OF WOUND VAC;  Surgeon: Lowery Estefana RAMAN, DO;  Location:  [facility] OR;  Service: Plastics;  Laterality: Left;   BREAST LUMPECTOMY Left 07/2020   BREAST LUMPECTOMY WITH RADIOACTIVE SEED AND SENTINEL LYMPH NODE BIOPSY Left 07/26/2020   Procedure: LEFT BREAST LUMPECTOMY WITH RADIOACTIVE SEED AND SENTINEL LYMPH NODE BIOPSY;  Surgeon: Vernetta Berg, MD;  Location: [facility];  Service: General;  Laterality: Left;   CESAREAN SECTION  1986   COLONOSCOPY     DIAGNOSTIC LAPAROSCOPY  1985   ESOPHAGOGASTRODUODENOSCOPY N/A 02/21/2023   Procedure: ESOPHAGOGASTRODUODENOSCOPY (EGD);  Surgeon: Abran Norleen SAILOR, MD;  Location: [facility];  Service: Gastroenterology;  Laterality: N/A;   HARDWARE REMOVAL Left 04/30/2022   Procedure: HARDWARE REMOVAL LEFT FEMUR;  Surgeon: Fidel Rogue, MD;  Location: [facility];  Service: Orthopedics;  Laterality: Left;  150   HARDWARE REMOVAL Left 11/04/2022   Procedure: HARDWARE REMOVAL INFECTED LEFT TOTAL KNEE ARTHROPLASTY;  Surgeon: Harden Jerona GAILS, MD;  Location: [facility] OR;  Service: Orthopedics;  Laterality: Left;   I & D KNEE WITH POLY EXCHANGE Left 06/07/2022   Procedure: IRRIGATION AND DEBRIDEMENT KNEE WITH POLY EXCHANGE;  Surgeon: Fidel Rogue, MD;  Location: [facility];  Service: Orthopedics;  Laterality: Left;   INCISION AND DRAINAGE OF WOUND  Left 06/04/2022   Procedure: IRRIGATION AND DEBRIDEMENT KNEE;  Surgeon: Fidel Rogue, MD;  Location: [facility];  Service: Orthopedics;  Laterality: Left;  60   INCISION AND DRAINAGE OF WOUND Left 06/15/2022   Procedure: excision of knee wound with Myriad;  Surgeon: Lowery Estefana RAMAN, DO;  Location: [facility];  Service: Plastics;  Laterality: Left;   ORIF FEMUR FRACTURE Left 04/10/2021   Procedure: OPEN REDUCTION INTERNAL FIXATION (ORIF) DISTAL FEMUR FRACTURE;  Surgeon: Celena Sharper, MD;  Location: [facility] OR;  Service: Orthopedics;  Laterality: Left;   PARTIAL KNEE ARTHROPLASTY Left 04/30/2022   Procedure: DISTAL FEMUR REPLACEMENT;  Surgeon: Fidel Rogue, MD;  Location: [facility];   Service: Orthopedics;  Laterality: Left;  150   Patient Active Problem List   Diagnosis Date Noted   Acute upper GI bleed 06/12/2023   Melena 06/11/2023   Hypercalcemia 06/11/2023   ABLA (acute blood loss anemia) 06/11/2023   Left femoral vein DVT (HCC) 02/20/2023   GERD (gastroesophageal reflux disease) 02/20/2023   Acute upper gastrointestinal bleeding 02/19/2023   Bipolar 1 disorder, depressed, partial remission (HCC) 11/18/2022   Left above-knee amputee (HCC) 11/13/2022   Infection of total knee replacement (HCC) 11/04/2022   S/P AKA (above knee amputation) unilateral, left (HCC) 11/04/2022   Chronic hepatitis C with cirrhosis (HCC) 06/06/2022   Surgical wound dehiscence, initial encounter 06/04/2022   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, left, with nonunion, subsequent encounter 04/30/2022   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 04/30/2022   Hypokalemia 04/16/2021   Hip fracture requiring operative repair (HCC) 04/16/2021   Vitamin D  deficiency 04/12/2021   Hyponatremia 04/11/2021   Acute postoperative anemia due to expected blood loss 04/11/2021   Class 1 obesity 04/11/2021   Left comminuted and displaced fracture of the distal femur metaphysis 04/08/2021   History of CVA (cerebrovascular accident) 04/08/2021   Other cirrhosis of liver (HCC) 01/01/2021   Genetic testing 07/17/2020   Family history of breast cancer 07/10/2020   Family history of ovarian cancer 07/10/2020   Family history of colon cancer 07/10/2020   Malignant neoplasm of upper-outer quadrant of left breast in female, estrogen receptor positive (HCC) 07/09/2020   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   Chronic pain syndrome 10/06/2012   Nicotine  dependence 10/06/2012   Anxiety state 10/05/2012   Bipolar disorder, unspecified (HCC) 10/05/2012   Slurred speech 10/04/2012   Left-sided weakness 10/04/2012   Hyperkalemia 10/04/2012    Acute CVA (cerebrovascular accident) (HCC) 10/04/2012   Essential hypertension    PCP: Norval Kettle, MD  REFERRING PROVIDER: Harden Jerona GAILS, MD  ONSET DATE:  10/05/2023  MD referral to PT   REFERRING DIAG: Z89.612 (ICD-10-CM) - Hx of AKA (above knee amputation), left   THERAPY DIAG:  Other abnormalities of gait and mobility  Unsteadiness on feet  Rationale for Evaluation and Treatment: Rehabilitation  SUBJECTIVE:   SUBJECTIVE STATEMENT: This 68yo female received her first prosthesis on 01/25/2023. She has been walking with rollator walker.  Dr. Harden wants her assessed for Microprocessor Knee Prosthesis (MPK).   Pt accompanied by: significant other  PERTINENT HISTORY: Left TFA 11/04/22, anemia, anxiety, arthritis, Bipolar disorder, Breast CA left with radiation, HTN, Hep C, liver cirrhosis, Neuromuscular disorder, osteopenia, stroke Left hemiparesis 2014   PAIN:  Are you having pain? No  PRECAUTIONS: Fall  WEIGHT BEARING RESTRICTIONS: No  FALLS: Has patient fallen in last 6 months? Yes. Number of falls 1 no injuries  LIVING ENVIRONMENT: Lives  with: lives with their spouse Lives in: Mobile home Home Access: Ramped entrance Home layout: One level  OCCUPATION: on disability  PLOF: Independent  PATIENT GOALS: want a new prosthesis that will allow you to do more.   OBJECTIVE:  COGNITION: Overall cognitive status: Within functional limits for tasks assessed  POSTURE: rounded shoulders, forward head, and flexed trunk   CARDIOVASCULAR RESPONSE: 10/18/2023 - Evaluation with current prosthesis: Functional activity: balance and gait functional outcome measures Pre-activity vitals: HR: 80 SpO2: 96% Post-activity vitals: HR: 112 SpO2: 95% Modified Borg scale for dyspnea: 2: mild shortness of breath  Physiological Cost Index:  walking heart rate - resting heart rate  /   gait velocity  10/18/2023 with her current prosthesis  112-80 (beats/min)  / 82.8 (ft/min) =   50.8%  TRANSFERS: 10/18/2023 - Evaluation with current prosthesis: Sit to stand: Modified independence from 18 chair with armrests to locked rollator walker Stand to sit: Modified independence from locked rollator walker to 18 chair with armrests  FUNCTIONAL TESTs:  10/18/2023 - Evaluation with current prosthesis: Timed Up & Go:  standard 46.07 sec with rollator walker; cognitive TUG (naming food A-Z) 50.93 sec  With cane stand alone tip with CGA - 75.94 sec  GAIT: 10/18/2023 - Evaluation with current prosthesis: Gait pattern: step through pattern, decreased arm swing- Right, decreased step length- Right, decreased stance time- Left, and trunk flexed Distance walked:  300' rollator walker and 25' X 2 cane stand alone tip Assistive device utilized: Single point cane, Walker - 4 wheeled, and TFA prosthesis Level of assistance: Modified independence with rollator walker and Min A with cane Gait velocity: self-selected comfortable pace 1.38 ft/sec; fast pace 1.54  ft/sec (3 steps had difficulty advancing prosthesis so knee ext at initial contact due to prosthetic knee)  AMPUTEE MOBILITY PREDICTOR ASSESSMENT TOOL Initial instructions: Client is seated in a hard chair with arms. The following manoeuvres are tested with or without the use of the prosthesis.  Advise the person of each task or group of tasks prior to performance.  Please avoid unnecessary chatter throughout the test.  Safety First, no task should be performed if either the tester or client is uncertain of a safe outcome.  TASK SCORING GUIDELINES SCORE OPTIONS EVALUATION WITH CURRENT PROSTHESIS 10/18/2023 EVALUATION WITH MICROPROSCESSOR PROSTHESIS  1. Sitting Balance: Sit forward in a chair with arms folded across chest for 60s. Cannot sit upright independently for 60s Can sit upright independently for 60s = 0 = 1  1   2. Sitting reach:  Reach forwards and grasp the ruler.  (Tester holds ruler 12in beyond extended arms midline to the  sternum) Does not attempt Cannot grasp or requires arm support Reaches forward and successfully grasps item.  = 0 = 1  = 2    2   3. Chair to chair transfer: 2 chairs at 90. Pt. may choose direction and use their upper limbs. Cannot do or requires physical assistance Performs independently, but appears unsteady Performs independently, appears to be steady and safe = 0  = 1 = 2  2   4. Arises from a chair: Ask pt. to fold arms across chest and stand. If unable, use arms or assistive device. Unable without help (physical assistance) Able, uses arms/assist device to help Able, without using arms = 0  = 1 = 2   1   5. Attempts to arise from a chair: (stopwatch ready) If attempt in no. 4. was without arms then ignore and allow another  attempt without penalty. Unable without help (physical assistance) Able requires >1 attempt Able to rise one attempt = 0  = 1 = 2   2   6. Immediate Standing Balance: (first 5s) Begin timing immediately. Unsteady (staggers, moves foot, sways ) Steady using walking aid or other support Steady without walker or other support = 0 = 1  = 2  1   7. Standing Balance (30s): (stopwatch ready) For item no.'s 7 & 8, first attempt is without assistive device.  If support is required allow after first attempt Unsteady Steady but uses walking aid or other support Standing without support = 0  = 1 = 2    2   8. Single limb standing balance: (stopwatch ready) Time the duration of single limb standing on both the sound and prosthetic limb up to 30s.   Grade the quality, not the time.  *Eliminate item 8 for AMPnoPRO* 1 Non-prosthetic side Unsteady Steady but uses walking aid or other support for 30s Single-limb standing without support for 30s  Prosthetic Side Unsteady Steady but uses walking aid or other support for 30s Single-limb standing without support for 30s  = 0 = 1  = 2    = 0  = 1  = 2    1     1    9. Standing  reach: Reach forward and grasp the ruler.  (Tester holds ruler 12in beyond extended arm(s) midline to the sternum) Does not attempt Cannot grasp or requires arm support on assistive device Reaches forward and successfully grasps item no support = 0  = 1  = 2   1   10. Nudge test: With feet as close together as possible, examiner pushes lightly on pt.'s sternum with palm of hand 3 times (toes should rise) Begins to fall Staggers, grabs, catches self ore uses assistive device Steady = 0  = 1 = 2   2   11. Eyes Closed: (at maximum position #7) If support is required grade as unsteady. Unsteady or grips assistive device Steady without any use of assistive device = 0  = 1  1     12. Pick up objects off the floor: Pick up a pencil off the floor placed midline 12in in front of foot. Unable to pick up object and return to standing Performs with some help (table, chair, walking aid etc) Performs independently (without help) = 0  = 1   = 2  1   13. Sitting down:  Ask pt. to fold arms across chest and sit. If unable, use arm or assistive device. Unsafe (misjudged distance, falls into chair ) Uses arms, assistive device or not a smooth motion Safe, smooth motion = 0  = 1 = 2   1   14. Initiation of gait: (immediately after told to "go") Any hesitancy or multiple attempts to start No hesitancy = 0 = 1  1   15. Step length and height: Walk a measured distance of 45ft twice (up and back). Four scores are required or two scores (a. & b.) for each leg. "Marked deviation" is defined as extreme substitute movements to avoid clearing the floor. a. Swing Foot Does not advance a minimum of 12in Advances a minimum of 12in  b. Foot Clearance Foot does not completely clear floor without deviation Foot completely clears floor without marked deviation  = 0  = 1   = 0 = 1 Prosthesis  1   1 Sound  1  1 Prosthesis      Sound         16. Step Continuity  Stopping or discontinuity between steps (stop & go gait) Steps appear continuous = 0  = 1  0   17. Turning:  180 degree turn when returning to chair. Unable to turn, requires intervention to prevent falling Greater than three steps but completes task without intervention No more than three continuous steps with or without assistive aid = 0  = 1  = 2    1   18. Variable cadence:  Walk a distance of 70ft fast as possible safely 4 times.  (Speeds may vary from slow to fast and fast to slow varying cadence) Unable to vary cadence in a controlled manner Asymmetrical increase in cadence controlled manner Symmetrical increase in speed in a controlled manner  = 0 = 1 = 2      0   19. Stepping over an obstacle: Place a movable box of 4in in height in the walking path. Cannot step over the box Catches foot, interrupts stride Steps over without interrupting stride = 0 = 1 = 2   0   20. Stairs (must have at least 2 steps):  Try to go up and down these stairs without holding on to the railing.  Don't hesitate to permit pt. to hold on to rail.  Safety First, if examiner feels that any risk in involved omit and score as 0.  Ascending Unsteady, cannot do One step at a time, or must hold on to railing or device Step over step, does not hold onto the railing or device  Descending Unsteady, cannot do One step at a time, or must hold on to railing or device Step over step, does not hold onto the railing or device  = 0  = 1 = 2    = 0  = 1 = 2   1     1    21. Assistive device selection:  Add points for the use of an assistive device if used for two or more items.  If testing without prosthesis use of appropriate assistive device is mandatory.   Bed bound Wheelchair / Parallel Bars Walker Crutches (axillary or forearm) Cane (straight or quad) None = 0 = 1 = 2 = 3 = 4 = 5    2     Total Score                                AMPPRO       29 /47 AMPPRO       /47    K LEVEL (converted from AMP score)  AMPPRO    K1 = (15-26)      K2 = (27-36)     K3 = (37-42)     K4 = (43-47)    CURRENT PROSTHETIC WEAR ASSESSMENT: 10/18/2023 - Evaluation with current prosthesis: Patient is independent with: skin check, residual limb care, prosthetic cleaning, ply sock cleaning, and correct ply sock adjustment Donning prosthesis: Modified independence Doffing prosthesis: Modified independence Prosthetic wear tolerance: >90% awake hours, 7 days/week Prosthetic weight bearing tolerance: 8 minutes Residual limb condition: reports no issues Prosthetic description: weight activated single axis knee and single axis foot.    TODAY'S TREATMENT:  DATE:  10/18/2023: PT explained process to assess with current prosthesis compared to Microprocessor knee.  Pt verbalized understanding.     ASSESSMENT:  CLINICAL IMPRESSION: Patient is 68 y.o. who was seen today for physical therapy evaluation for potential to utilize a Microprocessor Knee (MPK) Prosthesis.  PT performed multiple functional outcome tests with current prosthesis today.  Patient is currently functioning at K2 basic community with fixed cadence and is dependent on Rollator walker for community activities. She reports using cane in home some.  Patient has POTENTIAL with a Microprocessor Knee to function at K3 fuller community with variable cadence.  Plan is to put a loaner MPK on her socket for >3 days to enable patient decreased times and safey improve, then he should have improved mobility and safety with training how to maximize functions of MPK.  A MPK would enable stumble recovery to decrease falls and improve knee stability in balance & gait with less UE support.  OBJECTIVE IMPAIRMENTS: Abnormal gait and decreased balance.   ACTIVITY LIMITATIONS: standing and locomotion level  PARTICIPATION  LIMITATIONS: meal prep, cleaning, and community activity  PERSONAL FACTORS: Fitness, Time since onset of injury/illness/exacerbation, and 3+ comorbidities: see PMH are also affecting patient's functional outcome.   REHAB POTENTIAL: Good  CLINICAL DECISION MAKING: Evolving/moderate complexity  EVALUATION COMPLEXITY: Moderate  GOALS: Goals reviewed with patient? Yes  LONG TERM GOALS: Target date: 11/04/2023  Patient demonstrates & verbalized understanding of prosthetic recommendations. Baseline: SEE OBJECTIVE DATA Goal status: INITIAL   PLAN:  PT FREQUENCY: 1x/week  PT DURATION: 2 weeks  PLANNED INTERVENTIONS: 02835- PT Re-evaluation, 97750- Physical Performance Testing, 02238- Prosthetic Initial , and [id number]- Orthotic/Prosthetic subsequent  PLAN FOR NEXT SESSION:   perform above functional outcome measures with prosthesis with loaner microprocessor knee (>3 days for basic accommodation) with comparison to numbers.    Simmone Cape, PT, DPT 10/18/2023, 3:51 PM

## 2023-11-02 ENCOUNTER — Ambulatory Visit (INDEPENDENT_AMBULATORY_CARE_PROVIDER_SITE_OTHER): Admitting: Physical Therapy

## 2023-11-02 ENCOUNTER — Encounter: Payer: Self-pay | Admitting: Physical Therapy

## 2023-11-02 DIAGNOSIS — R293 Abnormal posture: Secondary | ICD-10-CM

## 2023-11-02 DIAGNOSIS — Z9181 History of falling: Secondary | ICD-10-CM | POA: Diagnosis not present

## 2023-11-02 DIAGNOSIS — R2681 Unsteadiness on feet: Secondary | ICD-10-CM

## 2023-11-02 DIAGNOSIS — R2689 Other abnormalities of gait and mobility: Secondary | ICD-10-CM | POA: Diagnosis not present

## 2023-11-02 DIAGNOSIS — M6281 Muscle weakness (generalized): Secondary | ICD-10-CM | POA: Diagnosis not present

## 2023-11-02 NOTE — Therapy (Addendum)
 OUTPATIENT PHYSICAL THERAPY PROSTHETICS TREATMENT & DISCHARGE SUMMARY   Patient Name: Tammy [name] MRN: [id number] DOB:06-08-1955, 68 y.o., female Today's Date: 11/02/2023  PHYSICAL THERAPY DISCHARGE SUMMARY  Visits from Start of Care: 2  Current functional level related to goals / functional outcomes: See below   Remaining deficits: See below   Education / Equipment: Microprocessor Knee recommendation.    Patient agrees to discharge. Patient goals were met. Patient is being discharged due to meeting the stated rehab goals.  END OF SESSION:  PT End of Session - 11/02/23 1305     Visit Number 2    Number of Visits 2    Date for Recertification  11/04/23    Authorization Type [employer]    Authorization Time Period $20 copay    PT Start Time 1300    PT Stop Time 1339    PT Time Calculation (min) 39 min    Equipment Utilized During Treatment Gait belt    Activity Tolerance Patient tolerated treatment well    Behavior During Therapy WFL for tasks assessed/performed           Past Medical History:  Diagnosis Date   Anemia    hx of   Anxiety    on meds   Arthritis    back/bilateral ankles   Bipolar disorder (HCC)    Breast cancer (HCC) 2022   LEFT breast-radiation   Depression    on meds   Family history of breast cancer 07/10/2020   Family history of colon cancer 07/10/2020   Family history of ovarian cancer 07/10/2020   GERD (gastroesophageal reflux disease)    on meds   Heart murmur    dx by PCP   Hepatitis C    HTN (hypertension)    on meds   Hx of radiation therapy    Liver cirrhosis (HCC)    from hep c   Neuromuscular disorder (HCC)    Osteopenia    Personal history of radiation therapy    Stroke (HCC) 09/2012   slurred speech and LEFT sided weakness/notes 10/04/2012   Past Surgical History:  Procedure Laterality Date   AMPUTATION Left 11/04/2022   Procedure: LEFT ABOVE KNEE AMPUTATION;  Surgeon: Harden Jerona GAILS, MD;  Location:  [facility] OR;  Service: Orthopedics;  Laterality: Left;   ANKLE FRACTURE SURGERY Left 2018   APPLICATION OF WOUND VAC Left 06/15/2022   Procedure: APPLICATION OF WOUND VAC;  Surgeon: Lowery Estefana RAMAN, DO;  Location: [facility]S;  Service: Plastics;  Laterality: Left;   APPLICATION OF WOUND VAC Left 09/14/2022   Procedure: APPLICATION OF WOUND VAC;  Surgeon: Lowery Estefana RAMAN, DO;  Location: [facility] OR;  Service: Plastics;  Laterality: Left;   BREAST LUMPECTOMY Left 07/2020   BREAST LUMPECTOMY WITH RADIOACTIVE SEED AND SENTINEL LYMPH NODE BIOPSY Left 07/26/2020   Procedure: LEFT BREAST LUMPECTOMY WITH RADIOACTIVE SEED AND SENTINEL LYMPH NODE BIOPSY;  Surgeon: Vernetta Berg, MD;  Location: [facility];  Service: General;  Laterality: Left;   CESAREAN SECTION  1986   COLONOSCOPY     DIAGNOSTIC LAPAROSCOPY  1985   ESOPHAGOGASTRODUODENOSCOPY N/A 02/21/2023   Procedure: ESOPHAGOGASTRODUODENOSCOPY (EGD);  Surgeon: Abran Norleen SAILOR, MD;  Location: [facility];  Service: Gastroenterology;  Laterality: N/A;   HARDWARE REMOVAL Left 04/30/2022   Procedure: HARDWARE REMOVAL LEFT FEMUR;  Surgeon: Fidel Rogue, MD;  Location: [facility];  Service: Orthopedics;  Laterality: Left;  150   HARDWARE REMOVAL Left 11/04/2022   Procedure: HARDWARE REMOVAL  INFECTED LEFT TOTAL KNEE ARTHROPLASTY;  Surgeon: Harden Jerona GAILS, MD;  Location: [facility] OR;  Service: Orthopedics;  Laterality: Left;   I & D KNEE WITH POLY EXCHANGE Left 06/07/2022   Procedure: IRRIGATION AND DEBRIDEMENT KNEE WITH POLY EXCHANGE;  Surgeon: Fidel Rogue, MD;  Location: [facility];  Service: Orthopedics;  Laterality: Left;   INCISION AND DRAINAGE OF WOUND Left 06/04/2022   Procedure: IRRIGATION AND DEBRIDEMENT KNEE;  Surgeon: Fidel Rogue, MD;  Location: [facility];  Service: Orthopedics;  Laterality: Left;  60   INCISION AND DRAINAGE OF WOUND Left 06/15/2022   Procedure: excision of knee wound with Myriad;  Surgeon: Lowery Estefana RAMAN, DO;  Location:  [facility];  Service: Plastics;  Laterality: Left;   ORIF FEMUR FRACTURE Left 04/10/2021   Procedure: OPEN REDUCTION INTERNAL FIXATION (ORIF) DISTAL FEMUR FRACTURE;  Surgeon: Celena Sharper, MD;  Location: [facility] OR;  Service: Orthopedics;  Laterality: Left;   PARTIAL KNEE ARTHROPLASTY Left 04/30/2022   Procedure: DISTAL FEMUR REPLACEMENT;  Surgeon: Fidel Rogue, MD;  Location: [facility];  Service: Orthopedics;  Laterality: Left;  150   Patient Active Problem List   Diagnosis Date Noted   Acute upper GI bleed 06/12/2023   Melena 06/11/2023   Hypercalcemia 06/11/2023   ABLA (acute blood loss anemia) 06/11/2023   Left femoral vein DVT (HCC) 02/20/2023   GERD (gastroesophageal reflux disease) 02/20/2023   Acute upper gastrointestinal bleeding 02/19/2023   Bipolar 1 disorder, depressed, partial remission (HCC) 11/18/2022   Left above-knee amputee (HCC) 11/13/2022   Infection of total knee replacement 11/04/2022   S/P AKA (above knee amputation) unilateral, left (HCC) 11/04/2022   Chronic hepatitis C with cirrhosis (HCC) 06/06/2022   Surgical wound dehiscence, initial encounter 06/04/2022   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, left, with nonunion, subsequent encounter 04/30/2022   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 04/30/2022   Hypokalemia 04/16/2021   Hip fracture requiring operative repair (HCC) 04/16/2021   Vitamin D  deficiency 04/12/2021   Hyponatremia 04/11/2021   Acute postoperative anemia due to expected blood loss 04/11/2021   Class 1 obesity 04/11/2021   Left comminuted and displaced fracture of the distal femur metaphysis 04/08/2021   History of CVA (cerebrovascular accident) 04/08/2021   Other cirrhosis of liver (HCC) 01/01/2021   Genetic testing 07/17/2020   Family history of breast cancer 07/10/2020   Family history of ovarian cancer 07/10/2020   Family history of colon cancer 07/10/2020   Malignant neoplasm  of upper-outer quadrant of left breast in female, estrogen receptor positive (HCC) 07/09/2020   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   Chronic pain syndrome 10/06/2012   Nicotine  dependence 10/06/2012   Anxiety state 10/05/2012   Bipolar disorder, unspecified (HCC) 10/05/2012   Slurred speech 10/04/2012   Left-sided weakness 10/04/2012   Hyperkalemia 10/04/2012   Acute CVA (cerebrovascular accident) (HCC) 10/04/2012   Essential hypertension    PCP: Norval Kettle, MD  REFERRING PROVIDER: Harden Jerona GAILS, MD  ONSET DATE:  10/05/2023  MD referral to PT   REFERRING DIAG: Z89.612 (ICD-10-CM) - Hx of AKA (above knee amputation), left   THERAPY DIAG:  Other abnormalities of gait and mobility  Unsteadiness on feet  History of falling  Muscle weakness (generalized)  Abnormal posture  Rationale for Evaluation and Treatment: Rehabilitation  SUBJECTIVE:   SUBJECTIVE STATEMENT: Prosthetist put a loaner Microprocessor Knee on her Prosthesis (MPK) on 10/27/23 ( 6 days ago).  She feels like it makes her safer, quicker  and more stable.  No falls since PT saw her.   Pt accompanied by: significant other  PERTINENT HISTORY: Left TFA 11/04/22, anemia, anxiety, arthritis, Bipolar disorder, Breast CA left with radiation, HTN, Hep C, liver cirrhosis, Neuromuscular disorder, osteopenia, stroke Left hemiparesis 2014   PAIN:  Are you having pain? No  PRECAUTIONS: Fall  WEIGHT BEARING RESTRICTIONS: No  FALLS: Has patient fallen in last 6 months? Yes. Number of falls 1 no injuries  LIVING ENVIRONMENT: Lives with: lives with their spouse Lives in: Mobile home Home Access: Ramped entrance Home layout: One level  OCCUPATION: on disability  PLOF: Independent  PATIENT GOALS: want a new prosthesis that will allow you to do more.   OBJECTIVE:  COGNITION: Overall cognitive status: Within functional limits for tasks assessed  POSTURE: rounded shoulders, forward head, and flexed trunk    CARDIOVASCULAR RESPONSE: 11/02/2023 Evaluation with Microprocessor Knee Prosthesis (MPK): Functional activity: balance and gait functional outcome measures Pre-activity vitals: HR: 75 SpO2: 99% Post-activity vitals: HR: 104 SpO2: 95% Modified Borg scale for dyspnea: 1: very mild shortness of breath  Physiological Cost Index:  walking heart rate - resting heart rate  /   gait velocity  11/02/2023 with loaner MPK prosthesis  104-75 (beats/min)  / 126.6 (ft/min) =  22.9%    10/18/2023 - Evaluation with current prosthesis: Functional activity: balance and gait functional outcome measures Pre-activity vitals: HR: 80 SpO2: 96% Post-activity vitals: HR: 112 SpO2: 95% Modified Borg scale for dyspnea: 2: mild shortness of breath  Physiological Cost Index:  walking heart rate - resting heart rate  /   gait velocity  10/18/2023 with her current prosthesis  112-80 (beats/min)  / 82.8 (ft/min) =  50.8%  TRANSFERS: 11/02/2023 Evaluation with Microprocessor Knee Prosthesis (MPK): Sit to stand: Modified independence from 18 chair with armrests without having to touch external support to stabilize Stand to sit: Modified independence to 18 chair with armrests without having external support for stability to prepare to sit down.    10/18/2023 - Evaluation with current prosthesis: Sit to stand: Modified independence from 18 chair with armrests to locked rollator walker Stand to sit: Modified independence from locked rollator walker to 18 chair with armrests  FUNCTIONAL TESTs:  11/02/2023 Evaluation with Microprocessor Knee Prosthesis (MPK): Timed Up & Go:  standard 28.41 sec with rollator walker; cognitive TUG (naming food A-Z) 29.13 sec  With cane stand alone tip with CGA - 32.19 sec   10/18/2023 - Evaluation with current prosthesis: Timed Up & Go:  standard 46.07 sec with rollator walker; cognitive TUG (naming food A-Z) 50.93 sec  With cane stand alone tip with CGA - 75.94  sec  GAIT: 11/02/2023 Evaluation with Microprocessor Knee Prosthesis (MPK): Gait pattern: patient had improved fluency moving prosthesis between swing and stance.  She was able to utilize stumble recovery 2x during evaluation.  Distance walked:  400' rollator walker and 25' X 3 cane stand alone tip Assistive device utilized: Single point cane, Walker - 4 wheeled, and TFA prosthesis Level of assistance: Modified independence with rollator walker and Min A with cane Gait velocity with rollator walker: self-selected comfortable pace 2.11 ft/sec; fast pace 2.29  ft/sec   10/18/2023 - Evaluation with current prosthesis: Gait pattern: step through pattern, decreased arm swing- Right, decreased step length- Right, decreased stance time- Left, and trunk flexed Distance walked:  300' rollator walker and 25' X 2 cane stand alone tip Assistive device utilized: Single point cane, Walker - 4 wheeled, and TFA prosthesis Level  of assistance: Modified independence with rollator walker and Min A with cane Gait velocity with rollator walker: self-selected comfortable pace 1.38 ft/sec; fast pace 1.54  ft/sec (3 steps had difficulty advancing prosthesis so knee ext at initial contact due to prosthetic knee)  AMPUTEE MOBILITY PREDICTOR ASSESSMENT TOOL Initial instructions: Client is seated in a hard chair with arms. The following manoeuvres are tested with or without the use of the prosthesis.  Advise the person of each task or group of tasks prior to performance.  Please avoid unnecessary chatter throughout the test.  Safety First, no task should be performed if either the tester or client is uncertain of a safe outcome.  TASK SCORING GUIDELINES SCORE OPTIONS EVALUATION WITH CURRENT PROSTHESIS 10/18/2023 EVALUATION WITH MICROPROSCESSOR PROSTHESIS 11/02/2023  1. Sitting Balance: Sit forward in a chair with arms folded across chest for 60s. Cannot sit upright independently for 60s Can sit upright independently for 60s  = 0 = 1  1 1   2. Sitting reach:  Reach forwards and grasp the ruler.  (Tester holds ruler 12in beyond extended arms midline to the sternum) Does not attempt Cannot grasp or requires arm support Reaches forward and successfully grasps item.  = 0 = 1  = 2    2 2   3. Chair to chair transfer: 2 chairs at 90. Pt. may choose direction and use their upper limbs. Cannot do or requires physical assistance Performs independently, but appears unsteady Performs independently, appears to be steady and safe = 0  = 1 = 2  2 2   4. Arises from a chair: Ask pt. to fold arms across chest and stand. If unable, use arms or assistive device. Unable without help (physical assistance) Able, uses arms/assist device to help Able, without using arms = 0  = 1 = 2   1 1   5. Attempts to arise from a chair: (stopwatch ready) If attempt in no. 4. was without arms then ignore and allow another attempt without penalty. Unable without help (physical assistance) Able requires >1 attempt Able to rise one attempt = 0  = 1 = 2   2 2   6. Immediate Standing Balance: (first 5s) Begin timing immediately. Unsteady (staggers, moves foot, sways ) Steady using walking aid or other support Steady without walker or other support = 0 = 1  = 2  1 2   7. Standing Balance (30s): (stopwatch ready) For item no.'s 7 & 8, first attempt is without assistive device.  If support is required allow after first attempt Unsteady Steady but uses walking aid or other support Standing without support = 0  = 1 = 2    2 2   8. Single limb standing balance: (stopwatch ready) Time the duration of single limb standing on both the sound and prosthetic limb up to 30s.   Grade the quality, not the time.  *Eliminate item 8 for AMPnoPRO* 1 Non-prosthetic side Unsteady Steady but uses walking aid or other support for 30s Single-limb standing without support for 30s  Prosthetic Side Unsteady Steady but uses walking aid or  other support for 30s Single-limb standing without support for 30s  = 0 = 1  = 2    = 0  = 1  = 2    1     1 1      1   9. Standing reach: Reach forward and grasp the ruler.  (Tester holds ruler 12in beyond extended arm(s) midline to the sternum) Does not attempt Cannot grasp or requires  arm support on assistive device Reaches forward and successfully grasps item no support = 0  = 1  = 2   1 2   10. Nudge test: With feet as close together as possible, examiner pushes lightly on pt.'s sternum with palm of hand 3 times (toes should rise) Begins to fall Staggers, grabs, catches self ore uses assistive device Steady = 0  = 1 = 2   2 2   11. Eyes Closed: (at maximum position #7) If support is required grade as unsteady. Unsteady or grips assistive device Steady without any use of assistive device = 0  = 1  1  1    12. Pick up objects off the floor: Pick up a pencil off the floor placed midline 12in in front of foot. Unable to pick up object and return to standing Performs with some help (table, chair, walking aid etc) Performs independently (without help) = 0  = 1   = 2  1 1   13. Sitting down:  Ask pt. to fold arms across chest and sit. If unable, use arm or assistive device. Unsafe (misjudged distance, falls into chair ) Uses arms, assistive device or not a smooth motion Safe, smooth motion = 0  = 1 = 2   1 1   14. Initiation of gait: (immediately after told to "go") Any hesitancy or multiple attempts to start No hesitancy = 0 = 1  1 1   15. Step length and height: Walk a measured distance of 53ft twice (up and back). Four scores are required or two scores (a. & b.) for each leg. "Marked deviation" is defined as extreme substitute movements to avoid clearing the floor. a. Swing Foot Does not advance a minimum of 12in Advances a minimum of 12in  b. Foot Clearance Foot does not completely clear floor without deviation Foot completely clears  floor without marked deviation  = 0  = 1   = 0 = 1 Prosthesis  1   1 Sound  1   1 Prosthesis  1   1    Sound  1   1       16. Step Continuity Stopping or discontinuity between steps (stop & go gait) Steps appear continuous = 0  = 1  0 1  17. Turning:  180 degree turn when returning to chair. Unable to turn, requires intervention to prevent falling Greater than three steps but completes task without intervention No more than three continuous steps with or without assistive aid = 0  = 1  = 2    1 1   18. Variable cadence:  Walk a distance of 61ft fast as possible safely 4 times.  (Speeds may vary from slow to fast and fast to slow varying cadence) Unable to vary cadence in a controlled manner Asymmetrical increase in cadence controlled manner Symmetrical increase in speed in a controlled manner  = 0 = 1 = 2      0 1  19. Stepping over an obstacle: Place a movable box of 4in in height in the walking path. Cannot step over the box Catches foot, interrupts stride Steps over without interrupting stride = 0 = 1 = 2   0 1  20. Stairs (must have at least 2 steps):  Try to go up and down these stairs without holding on to the railing.  Don't hesitate to permit pt. to hold on to rail.  Safety First, if examiner feels that any risk in involved omit and score as 0.  Ascending Unsteady, cannot do One step at a time, or must hold on to railing or device Step over step, does not hold onto the railing or device  Descending Unsteady, cannot do One step at a time, or must hold on to railing or device Step over step, does not hold onto the railing or device  = 0  = 1 = 2    = 0  = 1 = 2   1     1 1     1   21. Assistive device selection:  Add points for the use of an assistive device if used for two or more items.  If testing without prosthesis use of appropriate assistive device is mandatory.   Bed bound Wheelchair / Parallel  Bars Walker Crutches (axillary or forearm) Cane (straight or quad) None = 0 = 1 = 2 = 3 = 4 = 5    2 2     Total Score                                AMPPRO       29 /47 AMPPRO      34 /47   K LEVEL (converted from AMP score)  AMPPRO    K1 = (15-26)      K2 = (27-36)     K3 = (37-42)     K4 = (43-47)    CURRENT PROSTHETIC WEAR ASSESSMENT: 11/02/2023 - Evaluation with current prosthesis: Patient is independent with: skin check, residual limb care, prosthetic cleaning, ply sock cleaning, and correct ply sock adjustment Donning prosthesis: Modified independence Doffing prosthesis: Modified independence Prosthetic wear tolerance: >90% awake hours, 7 days/week Prosthetic weight bearing tolerance: 8 minutes Residual limb condition: reports no issues Prosthetic description: weight activated single axis knee and single axis foot.    TODAY'S TREATMENT:                                                                                                                             DATE:  11/02/2023: Physical Performance Testing:  See objective data for functional testing with Microprocessor Knee on her socket.     TREATMENT:                                                                                                                             DATE:  10/18/2023: PT explained process to assess with current prosthesis compared to Microprocessor knee.  Pt verbalized understanding.     ASSESSMENT:  CLINICAL IMPRESSION: Amoura appears would benefit from a microprocessor Knee (MPK)  prosthesis and with PT prosthetic training would expect greater changes.  She had improved significantly with Timed Up & Go tests, Gait Velocity and AmpPro score.  The Physiological Cost Index for gait activities to 22.9% (using loaner MPK)  from 50.8% (using her mechanical functioning prosthetic knee).  The MPK will improve the patient's functional health outcomes (e.g. fall reduction, injury prevention, lower  energy expenditure, help her accomplish standing ADLs and have a stumble recovery benefit. The patient is able to use a product that requires daily charging and is able to understand error alerts & alarms related to problems with the unit with training.  She is currently functioning at K2 level (basic community) but has potential to improve to K3 (fuller community with variable cadence) with MPK and prosthetic training.    A MPK would enable stumble recovery to decrease falls and improve knee stability in balance & gait with less UE support.  OBJECTIVE IMPAIRMENTS: Abnormal gait and decreased balance.   ACTIVITY LIMITATIONS: standing and locomotion level  PARTICIPATION LIMITATIONS: meal prep, cleaning, and community activity  PERSONAL FACTORS: Fitness, Time since onset of injury/illness/exacerbation, and 3+ comorbidities: see PMH are also affecting patient's functional outcome.   REHAB POTENTIAL: Good  CLINICAL DECISION MAKING: Evolving/moderate complexity  EVALUATION COMPLEXITY: Moderate  GOALS: Goals reviewed with patient? Yes  LONG TERM GOALS: Target date: 11/04/2023  Patient demonstrates & verbalized understanding of prosthetic recommendations. Baseline: SEE OBJECTIVE DATA Goal status: met 11/02/2023   PLAN:  PT FREQUENCY: 1x/week  PT DURATION: 2 weeks  PLANNED INTERVENTIONS: [id number]- PT Re-evaluation, 97750- Physical Performance Testing, 02238- Prosthetic Initial , and [id number]- Orthotic/Prosthetic subsequent  PLAN FOR NEXT SESSION:  recommend Microprocessor Knee prosthesis.     Jenaya Saar, PT, DPT 11/02/2023, 4:32 PM

## 2023-11-16 ENCOUNTER — Other Ambulatory Visit: Payer: Self-pay | Admitting: Physical Medicine & Rehabilitation

## 2023-11-23 ENCOUNTER — Other Ambulatory Visit: Payer: Self-pay | Admitting: Hematology

## 2023-12-13 ENCOUNTER — Encounter: Payer: Self-pay | Admitting: Radiology

## 2023-12-29 ENCOUNTER — Other Ambulatory Visit: Payer: Self-pay

## 2023-12-29 DIAGNOSIS — C50412 Malignant neoplasm of upper-outer quadrant of left female breast: Secondary | ICD-10-CM

## 2023-12-30 ENCOUNTER — Inpatient Hospital Stay: Attending: Nurse Practitioner

## 2023-12-30 ENCOUNTER — Inpatient Hospital Stay: Admitting: Nurse Practitioner

## 2024-01-05 IMAGING — US US ABDOMEN LIMITED
1 series · 14 of 25 positions shown · non-contrast
Comparison: 03/06/2021

CLINICAL DATA: Cirrhosis.

EXAM:
ULTRASOUND ABDOMEN LIMITED RIGHT UPPER QUADRANT

[Series 1: us abdomen limited · 0.17mm/px · 14 of 49 slices shown]
[im 1/49]
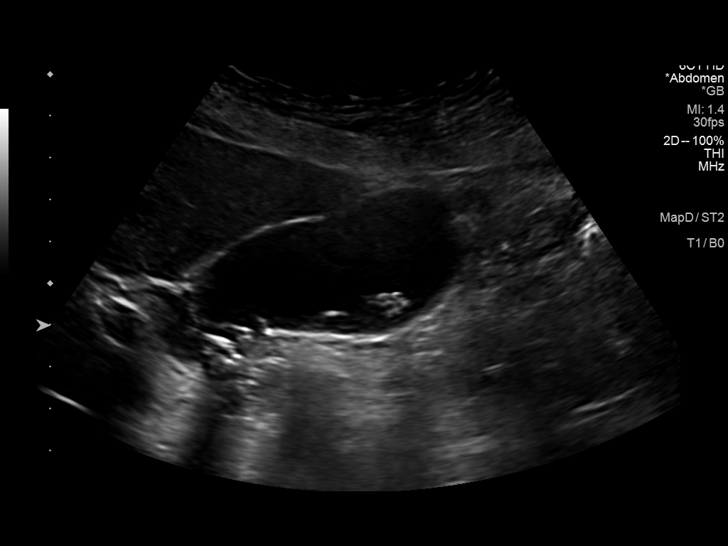
[im 5/49]
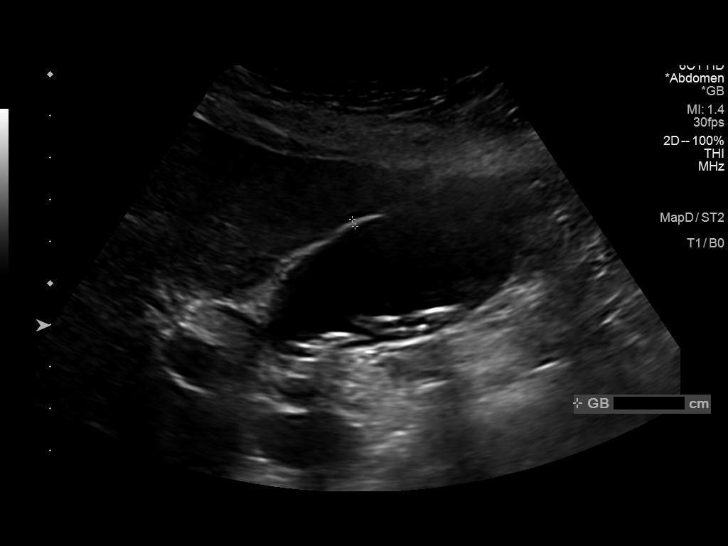
[im 9/49]
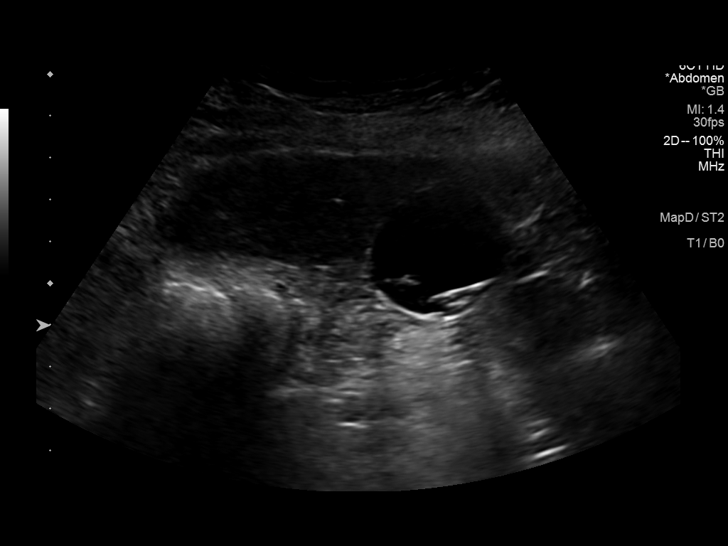
[im 13/49]
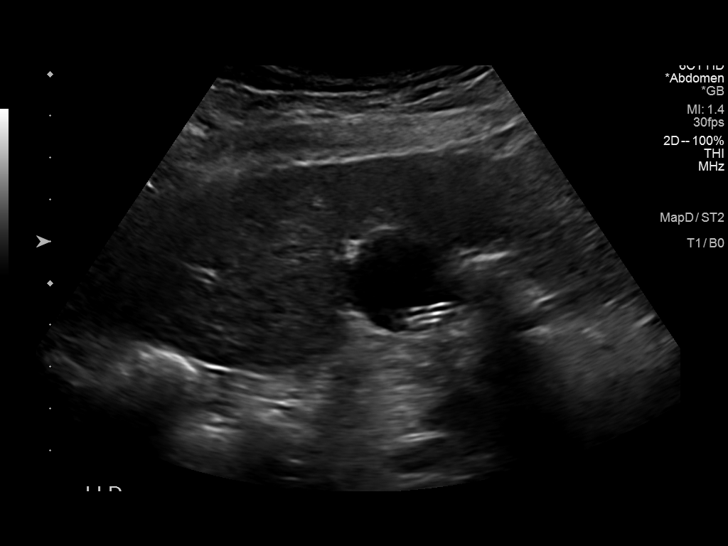
[im 17/49]
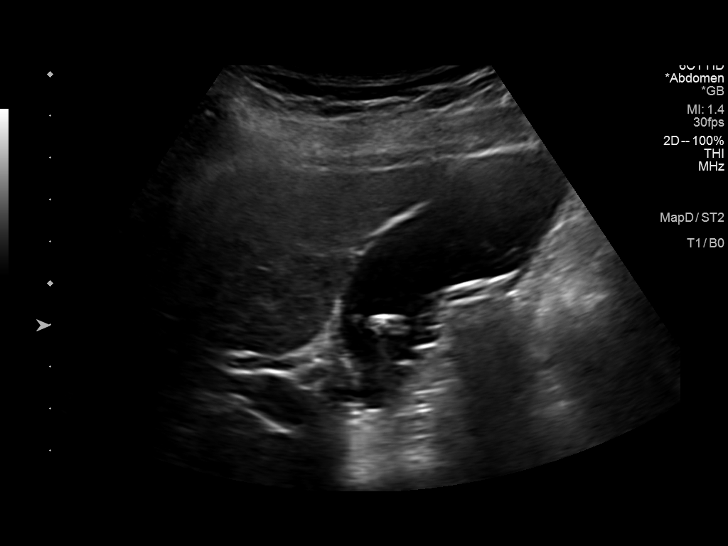
[im 19/49]
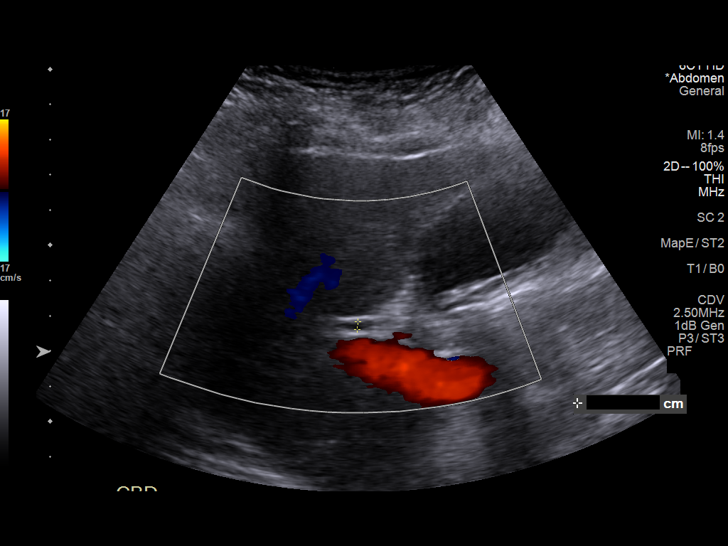
[im 23/49]
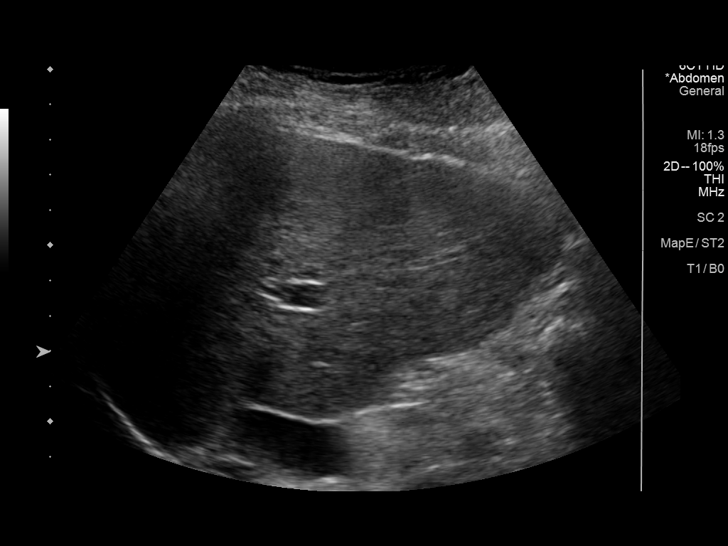
[im 27/49]
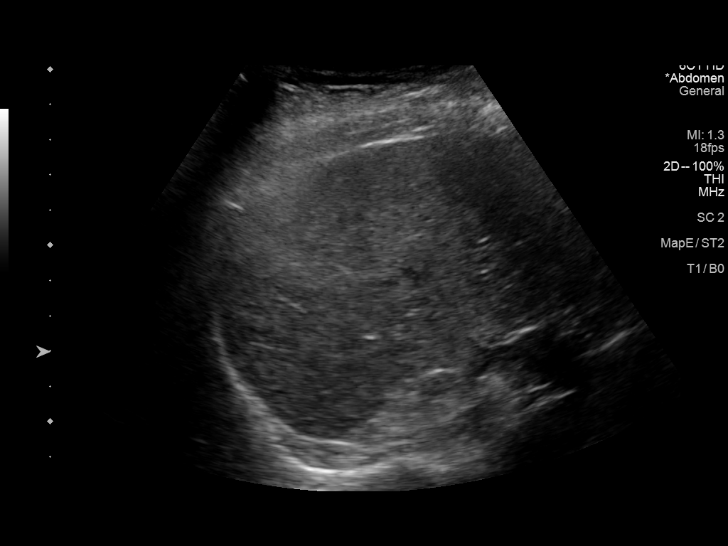
[im 31/49]
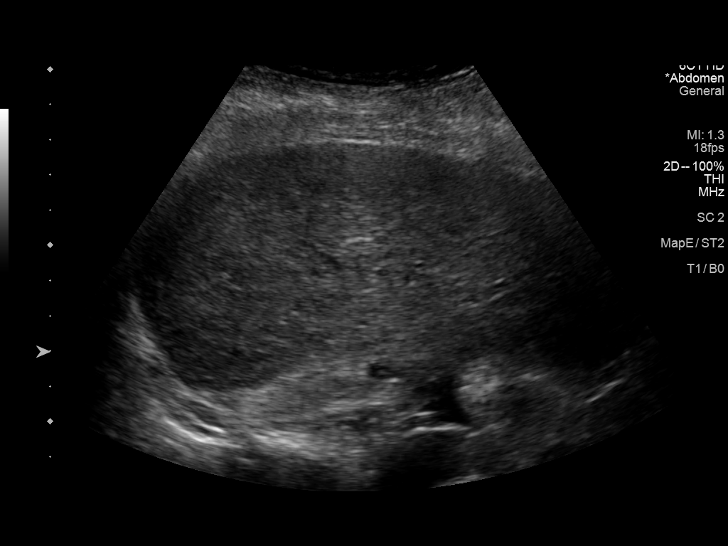
[im 33/49]
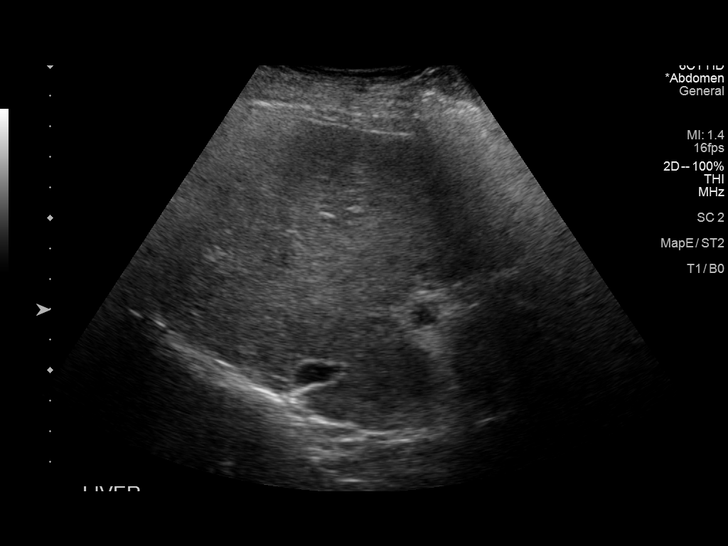
[im 37/49]
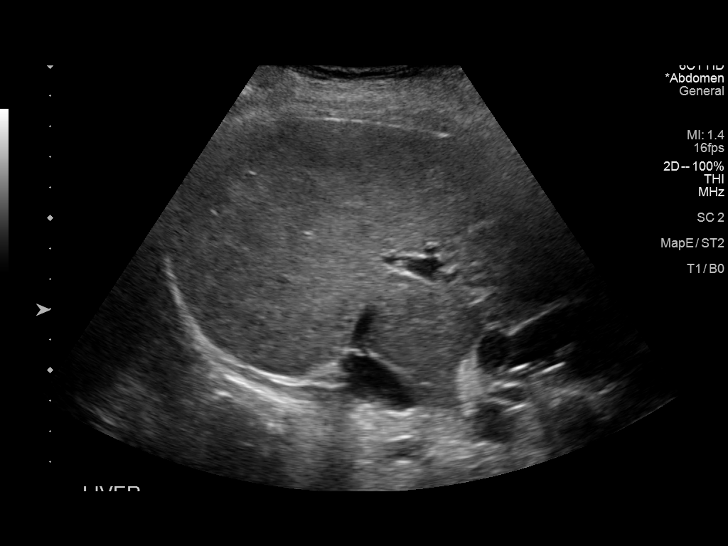
[im 41/49]
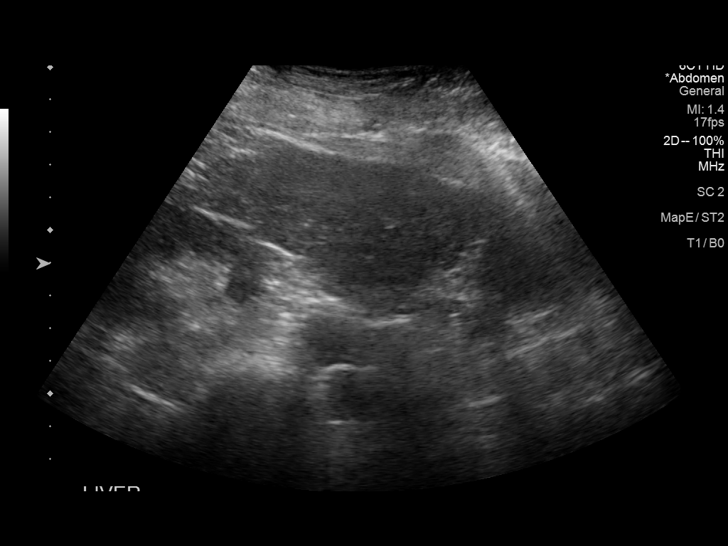
[im 45/49]
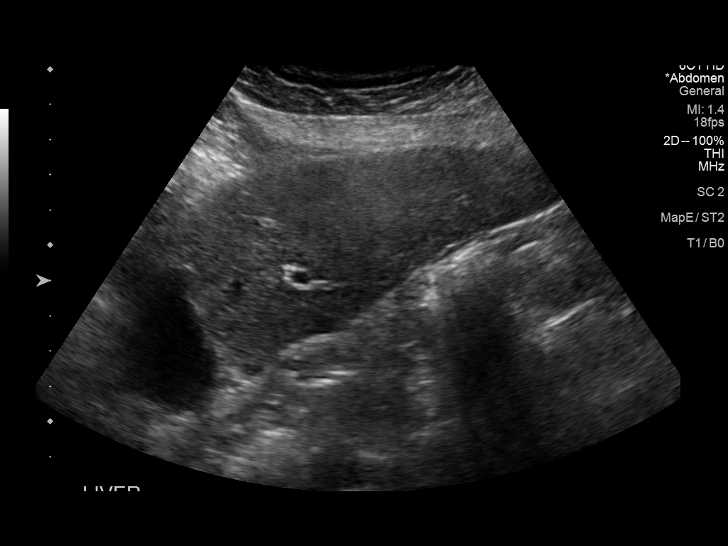
[im 49/49]
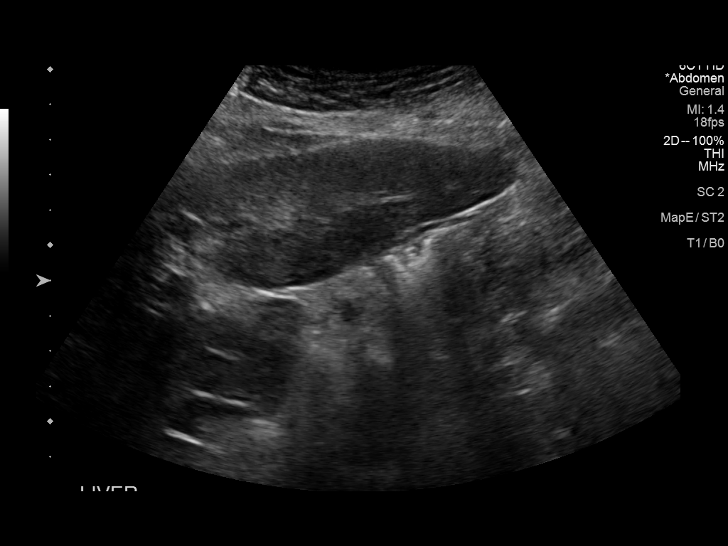

[14 of 25 positions shown; findings below may reference images not displayed]

FINDINGS: Gallbladder:

Multiple gallstones of up to 7 mm. No wall thickening or
pericholecystic fluid. Sonographic Murphy's sign was not elicited.

Common bile duct:

Diameter: Normal, 2 mm.

Liver:

Heterogeneously mildly increased hepatic echogenicity, nonspecific.
No focal liver lesion. Portal vein is patent on color Doppler
imaging with normal direction of blood flow towards the liver.

Other: No ascites.
IMPRESSION: No evidence of hepatocellular carcinoma.

Cholelithiasis.

## 2024-02-15 ENCOUNTER — Telehealth: Payer: Self-pay | Admitting: Hematology

## 2024-02-15 NOTE — Telephone Encounter (Signed)
Could not leave VM

## 2024-02-16 ENCOUNTER — Other Ambulatory Visit: Payer: Self-pay | Admitting: Nurse Practitioner

## 2024-02-16 DIAGNOSIS — K7469 Other cirrhosis of liver: Secondary | ICD-10-CM

## 2024-02-23 ENCOUNTER — Inpatient Hospital Stay: Admission: RE | Admit: 2024-02-23 | Discharge: 2024-02-23 | Attending: Nurse Practitioner

## 2024-02-23 DIAGNOSIS — K7469 Other cirrhosis of liver: Secondary | ICD-10-CM

## 2024-03-01 NOTE — Assessment & Plan Note (Signed)
-  T1bN0M0, Stage IA, G1 ductal carcinoma, ER+/PR+/HER2-,  diagnosed in 06/2020 -S/P lumpectomy and sentinel lymph node biopsy in June 2022 -Oncotype recurrence score 16 -She received adjuvant radiation -She has been on adjuvant anastrozole since September 2022.  She is tolerating well, plan for 5-7 years -Patient is clinically doing well, exam unremarkable, lab reviewed, there is no clinical concern for recurrence. -Will continue cancer surveillance

## 2024-03-01 NOTE — Progress Notes (Unsigned)
 " Patient Care Team: Norval Kettle, MD as PCP - General (Internal Medicine) Vernetta Berg, MD as Consulting Physician (General Surgery) Dewey Rush, MD as Consulting Physician (Radiation Oncology) Ines Onetha NOVAK, MD as Consulting Physician (Neurology) Crawford, Morna Pickle, NP as Nurse Practitioner (Hematology and Oncology) Lanny Callander, MD as Consulting Physician (Hematology)  Clinic Day:  03/01/2024  Referring physician: Norval Kettle, MD  ASSESSMENT & PLAN:   Assessment & Plan: Malignant neoplasm of upper-outer quadrant of left breast in female, estrogen receptor positive (HCC) -T1bN0M0, Stage IA, G1 ductal carcinoma, ER+/PR+/HER2-,  diagnosed in 06/2020 -S/P lumpectomy and sentinel lymph node biopsy in June 2022 -Oncotype recurrence score 16 -She received adjuvant radiation -She has been on adjuvant anastrozole  since September 2022.  She is tolerating well, plan for 5-7 years -Patient is clinically doing well, exam unremarkable, lab reviewed, there is no clinical concern for recurrence. -Will continue cancer surveillance   Bone health Most recent DEXA scan done on 06/17/2021.  She has osteopenia/low bone mass.  FRAX 10-year probability of fracture is 4.0% for major osteoporotic fracture and 0.5% for hip fracture.  She does take daily calcium  supplement.  She is due for new DEXA scan.  The patient understands the plans discussed today and is in agreement with them.  She knows to contact our office if she develops concerns prior to her next appointment.  I provided *** minutes of face-to-face time during this encounter and > 50% was spent counseling as documented under my assessment and plan.    Powell FORBES Lessen, NP  [facility] - A DEPT OF [facility] [address] [place] [address] Dept: [phone] Dept Fax: [phone]   No orders of the defined types were placed in this encounter.     CHIEF  COMPLAINT:  CC: Left breast cancer, ER +  Current Treatment: Adjuvant anastrozole  (started 10/2020)  INTERVAL HISTORY:  Tammy [name] is here today for repeat clinical assessment.  She was last seen by Dr. Lanny on 07/09/2023.  She had bilateral screening mammogram on 10/18/2023.  She has breast density category C.  Results were overall benign.  She denies fevers or chills. She denies pain. Her appetite is good. Her weight {Weight change:10426}.  I have reviewed the past medical history, past surgical history, social history and family history with the patient and they are unchanged from previous note.  ALLERGIES:  is allergic to lisinopril and pregabalin .  MEDICATIONS:  Current Outpatient Medications  Medication Sig Dispense Refill   anastrozole  (ARIMIDEX ) 1 MG tablet TAKE 1 TABLET BY MOUTH ONCE DAILY *NEW PRESCRIPTION REQUEST* 90 tablet 11   Ascorbic Acid  (VITAMIN C ) 1000 MG tablet Take 1,000 mg by mouth daily.     buPROPion  (WELLBUTRIN  SR) 150 MG 12 hr tablet Take 150 mg by mouth 2 (two) times daily.     calcium  carbonate (OS-CAL - DOSED IN MG OF ELEMENTAL CALCIUM ) 1250 (500 Ca) MG tablet Take 1 tablet by mouth daily.     Cyanocobalamin  (VITAMIN B-12 PO) Take 2,000 mcg by mouth daily.     DULoxetine  (CYMBALTA ) 30 MG capsule TAKE 1 CAPSULE(30 MG) BY MOUTH DAILY 30 capsule 0   gabapentin  (NEURONTIN ) 100 MG capsule Take 1 capsule (100 mg total) by mouth at bedtime. Take in addition to 300mg  dose. OK to increase by 1 capsule per week until 3 cap at night for hot flushes. 90 capsule 0   gabapentin  (NEURONTIN ) 300 MG capsule Take  1 capsule (300 mg total) by mouth at bedtime. 30 capsule 1   HYDROcodone -acetaminophen  (NORCO) 10-325 MG tablet Take 1 tablet by mouth See admin instructions. Take 1 tablet by mouth four times a day and an additional 1 tablet up to two times a day as needed for pain     lidocaine  (LIDODERM ) 5 % Place 1 patch onto the skin daily. Apply to painful area of left leg stump 30 patch 2    losartan  (COZAAR ) 50 MG tablet Take 1 tablet (50 mg total) by mouth daily. 30 tablet 2   pantoprazole  (PROTONIX ) 40 MG tablet Take 1 tablet (40 mg total) by mouth 2 (two) times daily. 60 tablet 0   potassium chloride  (KLOR-CON ) 10 MEQ tablet Take 10 mEq by mouth daily.     Prenatal Vit-Fe Fumarate-FA (PRENATAL PO) Take 1 tablet by mouth daily with breakfast.     promethazine  (PHENERGAN ) 25 MG tablet Take 1 tablet (25 mg total) by mouth every 6 (six) hours as needed for nausea or vomiting. 15 tablet 0   VOLTAREN 1 % GEL Apply 2-4 g topically 4 (four) times daily as needed (for pain- right knee or other affected area).     zinc  gluconate 50 MG tablet Take 50 mg by mouth daily.     No current facility-administered medications for this visit.    HISTORY OF PRESENT ILLNESS:   Oncology History Overview Note   Cancer Staging  Malignant neoplasm of upper-outer quadrant of left breast in female, estrogen receptor positive (HCC) Staging form: Breast, AJCC 8th Edition - Clinical stage from 07/10/2020: Stage IA (cT1b, cN0, cM0, G1, ER+, PR+, HER2-) - Signed by Layla Sandria BROCKS, MD on 07/10/2020 Stage prefix: Initial diagnosis Histologic grading system: 3 grade system - Pathologic: Stage IA (pT1b, pN0, cM0, G1, ER+, PR+, HER2-) - Signed by Crawford Morna Pickle, NP on 12/04/2020 Histologic grading system: 3 grade system     Malignant neoplasm of upper-outer quadrant of left breast in female, estrogen receptor positive (HCC)  07/02/2020 Initial Diagnosis   status post left breast upper outer quadrant biopsy 07/02/2020 for a clinical T1b N0, stage IA invasive ductal carcinoma, grade 1, estrogen and progesterone receptor positive, HER2 not amplified, with an MIB-1 of 5%   07/10/2020 Cancer Staging   Staging form: Breast, AJCC 8th Edition - Clinical stage from 07/10/2020: Stage IA (cT1b, cN0, cM0, G1, ER+, PR+, HER2-) - Signed by Layla Sandria BROCKS, MD on 07/10/2020 Stage prefix: Initial  diagnosis Histologic grading system: 3 grade system   07/17/2020 Genetic Testing   Negative hereditary cancer genetic testing: no pathogenic variants detected in [facility] BRCAPlus Panel or [facility] CancerNext-Expanded +RNAinsight.  The report dates are July 17, 2020 and July 22, 2020, respectively.   The BRCAplus panel offered by [employer] and includes sequencing and deletion/duplication analysis for the following 8 genes: ATM, BRCA1, BRCA2, CDH1, CHEK2, PALB2, PTEN, and TP53.  The CancerNext-Expanded gene panel offered by [facility] and includes sequencing, rearrangement, and RNA analysis for the following 77 genes: AIP, ALK, APC, ATM, AXIN2, BAP1, BARD1, BLM, BMPR1A, BRCA1, BRCA2, BRIP1, CDC73, CDH1, CDK4, CDKN1B, CDKN2A, CHEK2, CTNNA1, DICER1, FANCC, FH, FLCN, GALNT12, KIF1B, LZTR1, MAX, MEN1, MET, MLH1, MSH2, MSH3, MSH6, MUTYH, NBN, NF1, NF2, NTHL1, PALB2, PHOX2B, PMS2, POT1, PRKAR1A, PTCH1, PTEN, RAD51C, RAD51D, RB1, RECQL, RET, SDHA, SDHAF2, SDHB, SDHC, SDHD, SMAD4, SMARCA4, SMARCB1, SMARCE1, STK11, SUFU, TMEM127, TP53, TSC1, TSC2, VHL and XRCC2 (sequencing and deletion/duplication); EGFR, EGLN1, HOXB13, KIT, MITF, PDGFRA, POLD1, and POLE (sequencing  only); EPCAM and GREM1 (deletion/duplication only).    07/26/2020 Surgery   left lumpectomy and sentinel lymph node sampling 07/26/2020 showed a pT1b pN0, stage IA invasive ductal carcinoma, grade 1, with negative margins.             (A) a total of 3 left axilla lymph nodes were removed   07/26/2020 Oncotype testing   Oncotype score of 16 predicts a risk of recurrence outside the breast within the next 9 years of 4% if the patient's only systemic therapy is an antiestrogen for 5 years.  It also predicts no benefit from chemotherapy   08/28/2020 - 09/25/2020 Radiation Therapy   adjuvant radiation 08/28/2020 through 09/25/2020 Site Technique Total Dose (Gy) Dose per Fx (Gy) Completed Fx Beam Energies  Breast, Left: Breast_Lt 3D 42.56/42.56 2.66 16/16  6XFFF, 10XFFF  Breast, Left: Breast_Lt_Bst 3D 8/8 2 4/4 6X, 10X     10/21/2020 -  Anti-estrogen oral therapy   Anastrozole     12/04/2020 Cancer Staging   Staging form: Breast, AJCC 8th Edition - Pathologic: Stage IA (pT1b, pN0, cM0, G1, ER+, PR+, HER2-) - Signed by Crawford Morna Pickle, NP on 12/04/2020 Histologic grading system: 3 grade system       REVIEW OF SYSTEMS:   Constitutional: Denies fevers, chills or abnormal weight loss Eyes: Denies blurriness of vision Ears, nose, mouth, throat, and face: Denies mucositis or sore throat Respiratory: Denies cough, dyspnea or wheezes Cardiovascular: Denies palpitation, chest discomfort or lower extremity swelling Gastrointestinal:  Denies nausea, heartburn or change in bowel habits Skin: Denies abnormal skin rashes Lymphatics: Denies new lymphadenopathy or easy bruising Neurological:Denies numbness, tingling or new weaknesses Behavioral/Psych: Mood is stable, no new changes  All other systems were reviewed with the patient and are negative.   VITALS:  There were no vitals taken for this visit.  Wt Readings from Last 3 Encounters:  07/09/23 144 lb (65.3 kg)  06/11/23 141 lb (64 kg)  04/08/23 153 lb (69.4 kg)    There is no height or weight on file to calculate BMI.  Performance status (ECOG): {CHL ONC [id number]  PHYSICAL EXAM:   GENERAL:alert, no distress and comfortable SKIN: skin color, texture, turgor are normal, no rashes or significant lesions EYES: normal, Conjunctiva are pink and non-injected, sclera clear OROPHARYNX:no exudate, no erythema and lips, buccal mucosa, and tongue normal  NECK: supple, thyroid normal size, non-tender, without nodularity LYMPH:  no palpable lymphadenopathy in the cervical, axillary or inguinal LUNGS: clear to auscultation and percussion with normal breathing effort HEART: regular rate & rhythm and no murmurs and no lower extremity edema ABDOMEN:abdomen soft, non-tender and normal  bowel sounds Musculoskeletal:no cyanosis of digits and no clubbing  NEURO: alert & oriented x 3 with fluent speech, no focal motor/sensory deficits  LABORATORY DATA:  I have reviewed the data as listed    Component Value Date/Time   NA 137 06/12/2023 0409   K 3.2 (L) 06/12/2023 0409   CL 101 06/12/2023 0409   CO2 26 06/12/2023 0409   GLUCOSE 91 06/12/2023 0409   BUN 11 06/12/2023 0409   CREATININE 0.57 06/12/2023 0409   CREATININE 0.80 11/03/2022 1502   CALCIUM  10.0 06/12/2023 0409   PROT 6.9 06/12/2023 0409   PROT 6.8 12/01/2018 1511   ALBUMIN 3.5 06/12/2023 0409   AST 16 06/12/2023 0409   AST 14 (L) 10/13/2022 0901   ALT 12 06/12/2023 0409   ALT 11 10/13/2022 0901   ALKPHOS 68 06/12/2023 0409   BILITOT 0.4 06/12/2023  0409   BILITOT 0.2 (L) 10/13/2022 0901   GFRNONAA >60 06/12/2023 0409   GFRNONAA >60 10/13/2022 0901   GFRAA >90 10/04/2012 1310    No results found for: SPEP, UPEP  Lab Results  Component Value Date   WBC 5.4 06/12/2023   NEUTROABS 3.7 02/21/2023   HGB 10.2 (L) 06/12/2023   HCT 32.4 (L) 06/12/2023   MCV 89.5 06/12/2023   PLT 242 06/12/2023      Chemistry      Component Value Date/Time   NA 137 06/12/2023 0409   K 3.2 (L) 06/12/2023 0409   CL 101 06/12/2023 0409   CO2 26 06/12/2023 0409   BUN 11 06/12/2023 0409   CREATININE 0.57 06/12/2023 0409   CREATININE 0.80 11/03/2022 1502      Component Value Date/Time   CALCIUM  10.0 06/12/2023 0409   ALKPHOS 68 06/12/2023 0409   AST 16 06/12/2023 0409   AST 14 (L) 10/13/2022 0901   ALT 12 06/12/2023 0409   ALT 11 10/13/2022 0901   BILITOT 0.4 06/12/2023 0409   BILITOT 0.2 (L) 10/13/2022 0901       RADIOGRAPHIC STUDIES: I have personally reviewed the radiological images as listed and agreed with the findings in the report. US  Abdomen Limited RUQ (LIVER/GB) Result Date: 02/24/2024 CLINICAL DATA:  SCREEN FOR HCC AND EVALUATE LIVER MORPHOLOGY. History of hepatitis-C EXAM: ULTRASOUND ABDOMEN  LIMITED RIGHT UPPER QUADRANT COMPARISON:  December 18, 2022 FINDINGS: Gallbladder: Multiple mobile gallstones. No wall thickening visualized. No sonographic Murphy sign noted by sonographer. Common bile duct: Diameter: Visualized portion measures 5 mm, within normal limits. Liver: No focal lesion identified. Heterogeneous, mildly increased and mildly coarsened in parenchymal echogenicity. Portal vein is patent on color Doppler imaging with normal direction of blood flow towards the liver. Other: None. IMPRESSION: 1. Heterogeneous, mildly increased and mildly coarsened parenchymal echogenicity of the liver. Findings are nonspecific but can be seen in the setting of hepatic steatosis and/or underlying liver disease. No focal lesion identified. 2. Cholelithiasis without sonographic evidence of acute cholecystitis. Electronically Signed   By: Corean Salter M.D.   On: 02/24/2024 12:50   "

## 2024-03-02 ENCOUNTER — Inpatient Hospital Stay

## 2024-03-02 ENCOUNTER — Inpatient Hospital Stay: Admitting: Nurse Practitioner

## 2024-03-02 ENCOUNTER — Telehealth: Payer: Self-pay | Admitting: *Deleted

## 2024-03-02 DIAGNOSIS — C50412 Malignant neoplasm of upper-outer quadrant of left female breast: Secondary | ICD-10-CM

## 2024-03-07 NOTE — Telephone Encounter (Signed)
 Open in error

## 2024-03-07 NOTE — Progress Notes (Signed)
 22 Patient Care Team: Norval Kettle, MD as PCP - General (Internal Medicine) Vernetta Berg, MD as Consulting Physician (General Surgery) Dewey Rush, MD as Consulting Physician (Radiation Oncology) Ines Onetha NOVAK, MD as Consulting Physician (Neurology) Crawford, Morna Pickle, NP as Nurse Practitioner (Hematology and Oncology) Lanny Callander, MD as Consulting Physician (Hematology)  Clinic Day:  03/09/2024  Referring physician: Norval Kettle, MD  ASSESSMENT & PLAN:   Assessment & Plan: Malignant neoplasm of upper-outer quadrant of left breast in female, estrogen receptor positive (HCC) -T1bN0M0, Stage IA, G1 ductal carcinoma, ER+/PR+/HER2-,  diagnosed in 06/2020 -S/P lumpectomy and sentinel lymph node biopsy in June 2022 -Oncotype recurrence score 16 -She received adjuvant radiation -She has been on adjuvant anastrozole  since September 2022.  She is tolerating well, plan for 5-7 years -Patient is clinically doing well, exam unremarkable, lab reviewed, there is no clinical concern for recurrence. - Continue anastrozole  daily. -Plan for labs and follow-up in 6 months, sooner if needed.    Bone health Most recent DEXA scan done on 06/17/2021.  She has osteopenia/low bone mass.  FRAX 10-year probability of fracture is 4.0% for major osteoporotic fracture and 0.5% for hip fracture.  She does take daily calcium  supplement.  She is due for new DEXA scan.  Hypokalemia Potassium today is 2.9. She does not take oral potassium supplement.  She denies any recent GI illness or episode of dehydration. Will start oral potassium 20 mEq twice daily.  Have BMP rechecked at next appointment with primary care.  Will recheck in CMP here in 6 months.  Increased hot flashes Currently taking gabapentin  300 mg at night to help with hot flash management.  Increase gabapentin  300 mg to twice daily.  Reassess at next visit.  Plan  Labs reviewed. - Stable anemia. - Potassium 2.9.  Start oral potassium 20 mEq  twice daily. 3D screening mammogram from September 2025 reviewed. Recall in September 2026. Continue anastrozole  daily. DEXA scan ordered today. Plans for labs and follow-up in 6 months, sooner if needed.   The patient understands the plans discussed today and is in agreement with them.  She knows to contact our office if she develops concerns prior to her next appointment.  I provided 25 minutes of face-to-face time during this encounter and > 50% was spent counseling as documented under my assessment and plan.    Powell FORBES Lessen, NP  [facility] - A DEPT OF [facility] [address] [place] [address] Dept: [phone] Dept Fax: [phone]   Orders Placed This Encounter  Procedures   DG Bone Density    Standing Status:   Future    Expected Date:   04/08/2024    Expiration Date:   03/09/2025    Reason for Exam (SYMPTOM  OR DIAGNOSIS REQUIRED):   estrogen deficiency, history of breast cancer, on anastrozole     Preferred imaging location?:   [facility]      CHIEF COMPLAINT:  CC: Left breast cancer, ER +  Current Treatment: Adjuvant anastrozole  (started 10/2020)  INTERVAL HISTORY:  Alverta is here today for repeat clinical assessment.  She was last seen by Dr. Lanny on 07/09/2023.  She had bilateral screening mammogram on 10/18/2023.  She has breast density category C.  Results were overall benign.  She reports increased hot flashes. Would like to increase dose of gabapentin . This medication has been helpful in the past. She is otherwise tolerating anastrozole  well. She denies  masses, lumps, or other  changes in either breast. She denies chest pain, chest pressure, or shortness of breath. She denies headaches or visual disturbances. She denies abdominal pain, nausea, vomiting, or changes in bowel or bladder habits.  She denies fevers or chills. She denies pain. Her appetite is good. Her weight has  increased 23 pounds over last 7 months.  I have reviewed the past medical history, past surgical history, social history and family history with the patient and they are unchanged from previous note.  ALLERGIES:  is allergic to lisinopril and pregabalin .  MEDICATIONS:  Current Outpatient Medications  Medication Sig Dispense Refill   anastrozole  (ARIMIDEX ) 1 MG tablet TAKE 1 TABLET BY MOUTH ONCE DAILY *NEW PRESCRIPTION REQUEST* 90 tablet 11   bisacodyl  (DULCOLAX) 5 MG EC tablet Take 5 mg by mouth daily as needed.     buPROPion  (WELLBUTRIN  SR) 150 MG 12 hr tablet Take 150 mg by mouth 2 (two) times daily.     DULoxetine  (CYMBALTA ) 30 MG capsule TAKE 1 CAPSULE(30 MG) BY MOUTH DAILY 30 capsule 0   HYDROcodone -acetaminophen  (NORCO) 10-325 MG tablet Take 1 tablet by mouth See admin instructions. Take 1 tablet by mouth four times a day and an additional 1 tablet up to two times a day as needed for pain     losartan  (COZAAR ) 50 MG tablet Take 1 tablet (50 mg total) by mouth daily. 30 tablet 2   Multiple Vitamin (QUINTABS) TABS Take 1 tablet by mouth daily.     pantoprazole  (PROTONIX ) 40 MG tablet Take 1 tablet (40 mg total) by mouth 2 (two) times daily. 60 tablet 0   potassium chloride  SA (KLOR-CON  M) 20 MEQ tablet Take 1 tablet (20 mEq total) by mouth 2 (two) times daily. 60 tablet 0   Prenatal Vit-Fe Fumarate-FA (PRENATAL PO) Take 1 tablet by mouth daily with breakfast.     promethazine  (PHENERGAN ) 25 MG tablet Take 1 tablet (25 mg total) by mouth every 6 (six) hours as needed for nausea or vomiting. 15 tablet 0   VOLTAREN 1 % GEL Apply 2-4 g topically 4 (four) times daily as needed (for pain- right knee or other affected area).     zinc  gluconate 50 MG tablet Take 50 mg by mouth daily.     Ascorbic Acid  (VITAMIN C ) 1000 MG tablet Take 1,000 mg by mouth daily. (Patient not taking: Reported on 03/09/2024)     calcium  carbonate (OS-CAL - DOSED IN MG OF ELEMENTAL CALCIUM ) 1250 (500 Ca) MG tablet Take 1  tablet by mouth daily. (Patient not taking: Reported on 03/09/2024)     Cyanocobalamin  (VITAMIN B-12 PO) Take 2,000 mcg by mouth daily. (Patient not taking: Reported on 03/09/2024)     gabapentin  (NEURONTIN ) 300 MG capsule Take 1 capsule (300 mg total) by mouth 2 (two) times daily. 60 capsule 1   lidocaine  (LIDODERM ) 5 % Place 1 patch onto the skin daily. Apply to painful area of left leg stump (Patient not taking: Reported on 03/09/2024) 30 patch 2   potassium chloride  (KLOR-CON ) 10 MEQ tablet Take 10 mEq by mouth daily. (Patient not taking: Reported on 03/09/2024)     No current facility-administered medications for this visit.    HISTORY OF PRESENT ILLNESS:   Oncology History Overview Note   Cancer Staging  Malignant neoplasm of upper-outer quadrant of left breast in female, estrogen receptor positive (HCC) Staging form: Breast, AJCC 8th Edition - Clinical stage from 07/10/2020: Stage IA (cT1b, cN0, cM0, G1, ER+, PR+,  HER2-) - Signed by Layla Sandria BROCKS, MD on 07/10/2020 Stage prefix: Initial diagnosis Histologic grading system: 3 grade system - Pathologic: Stage IA (pT1b, pN0, cM0, G1, ER+, PR+, HER2-) - Signed by Crawford Morna Pickle, NP on 12/04/2020 Histologic grading system: 3 grade system     Malignant neoplasm of upper-outer quadrant of left breast in female, estrogen receptor positive (HCC)  07/02/2020 Initial Diagnosis   status post left breast upper outer quadrant biopsy 07/02/2020 for a clinical T1b N0, stage IA invasive ductal carcinoma, grade 1, estrogen and progesterone receptor positive, HER2 not amplified, with an MIB-1 of 5%   07/10/2020 Cancer Staging   Staging form: Breast, AJCC 8th Edition - Clinical stage from 07/10/2020: Stage IA (cT1b, cN0, cM0, G1, ER+, PR+, HER2-) - Signed by Layla Sandria BROCKS, MD on 07/10/2020 Stage prefix: Initial diagnosis Histologic grading system: 3 grade system   07/17/2020 Genetic Testing   Negative hereditary cancer genetic testing: no  pathogenic variants detected in [facility] BRCAPlus Panel or [facility] CancerNext-Expanded +RNAinsight.  The report dates are July 17, 2020 and July 22, 2020, respectively.   The BRCAplus panel offered by [employer] and includes sequencing and deletion/duplication analysis for the following 8 genes: ATM, BRCA1, BRCA2, CDH1, CHEK2, PALB2, PTEN, and TP53.  The CancerNext-Expanded gene panel offered by [facility] and includes sequencing, rearrangement, and RNA analysis for the following 77 genes: AIP, ALK, APC, ATM, AXIN2, BAP1, BARD1, BLM, BMPR1A, BRCA1, BRCA2, BRIP1, CDC73, CDH1, CDK4, CDKN1B, CDKN2A, CHEK2, CTNNA1, DICER1, FANCC, FH, FLCN, GALNT12, KIF1B, LZTR1, MAX, MEN1, MET, MLH1, MSH2, MSH3, MSH6, MUTYH, NBN, NF1, NF2, NTHL1, PALB2, PHOX2B, PMS2, POT1, PRKAR1A, PTCH1, PTEN, RAD51C, RAD51D, RB1, RECQL, RET, SDHA, SDHAF2, SDHB, SDHC, SDHD, SMAD4, SMARCA4, SMARCB1, SMARCE1, STK11, SUFU, TMEM127, TP53, TSC1, TSC2, VHL and XRCC2 (sequencing and deletion/duplication); EGFR, EGLN1, HOXB13, KIT, MITF, PDGFRA, POLD1, and POLE (sequencing only); EPCAM and GREM1 (deletion/duplication only).    07/26/2020 Surgery   left lumpectomy and sentinel lymph node sampling 07/26/2020 showed a pT1b pN0, stage IA invasive ductal carcinoma, grade 1, with negative margins.             (A) a total of 3 left axilla lymph nodes were removed   07/26/2020 Oncotype testing   Oncotype score of 16 predicts a risk of recurrence outside the breast within the next 9 years of 4% if the patient's only systemic therapy is an antiestrogen for 5 years.  It also predicts no benefit from chemotherapy   08/28/2020 - 09/25/2020 Radiation Therapy   adjuvant radiation 08/28/2020 through 09/25/2020 Site Technique Total Dose (Gy) Dose per Fx (Gy) Completed Fx Beam Energies  Breast, Left: Breast_Lt 3D 42.56/42.56 2.66 16/16 6XFFF, 10XFFF  Breast, Left: Breast_Lt_Bst 3D 8/8 2 4/4 6X, 10X     10/21/2020 -  Anti-estrogen oral therapy   Anastrozole      12/04/2020 Cancer Staging   Staging form: Breast, AJCC 8th Edition - Pathologic: Stage IA (pT1b, pN0, cM0, G1, ER+, PR+, HER2-) - Signed by Crawford Morna Pickle, NP on 12/04/2020 Histologic grading system: 3 grade system       REVIEW OF SYSTEMS:   Constitutional: Denies fevers, chills or abnormal weight loss. She has had weight gain.  Eyes: Denies blurriness of vision Ears, nose, mouth, throat, and face: Denies mucositis or sore throat Respiratory: Denies cough, dyspnea or wheezes Cardiovascular: Denies palpitation, chest discomfort or lower extremity swelling Gastrointestinal:  Denies nausea, heartburn or change in bowel habits Skin: Denies abnormal skin rashes Lymphatics: Denies new lymphadenopathy or  easy bruising Neurological:Denies numbness, tingling or new weaknesses Behavioral/Psych: Mood is stable, no new changes  All other systems were reviewed with the patient and are negative.   VITALS:   Today's Vitals   03/09/24 1100 03/09/24 1144  BP: (!) 148/70 139/85  Pulse: 66   Resp: 18   Temp: (!) 97.3 F (36.3 C)   TempSrc: Temporal   SpO2: 100%   Weight: 167 lb 14.4 oz (76.2 kg)   PainSc: 0-No pain    Body mass index is 30.71 kg/m.   Wt Readings from Last 3 Encounters:  03/09/24 167 lb 14.4 oz (76.2 kg)  07/09/23 144 lb (65.3 kg)  06/11/23 141 lb (64 kg)    Body mass index is 30.71 kg/m.  Performance status (ECOG): 2 - Symptomatic, <50% confined to bed  PHYSICAL EXAM:   GENERAL:alert, no distress and comfortable SKIN: skin color, texture, turgor are normal, no rashes or significant lesions EYES: normal, Conjunctiva are pink and non-injected, sclera clear OROPHARYNX:no exudate, no erythema and lips, buccal mucosa, and tongue normal  NECK: supple, thyroid normal size, non-tender, without nodularity LYMPH:  no palpable lymphadenopathy in the cervical, axillary or inguinal LUNGS: clear to auscultation and percussion with normal breathing effort HEART:  regular rate & rhythm and no murmurs and no lower extremity edema ABDOMEN:abdomen soft, non-tender and normal bowel sounds Musculoskeletal:no cyanosis of digits and no clubbing  NEURO: alert & oriented x 3 with fluent speech, no focal motor/sensory deficits BREAST: She has deep surgical scar in the outer upper quadrant of the left breast.  There is some fibrous tissue noted adjacent to the scar. There are no other palpable lumps or masses in the left breast.  Expected skin changes related to radiation are noted.  There is no nipple inversion or nipple discharge.  There is no axillary lymphadenopathy.  There are no palpable lumps or masses in the right breast.  There is no nipple inversion or nipple discharge.  There is no axillary lymphadenopathy on the right.    LABORATORY DATA:  I have reviewed the data as listed    Component Value Date/Time   NA 142 03/09/2024 1108   K 2.9 (L) 03/09/2024 1108   CL 100 03/09/2024 1108   CO2 30 03/09/2024 1108   GLUCOSE 108 (H) 03/09/2024 1108   BUN 13 03/09/2024 1108   CREATININE 0.79 03/09/2024 1108   CREATININE 0.80 11/03/2022 1502   CALCIUM  10.3 03/09/2024 1108   PROT 7.8 03/09/2024 1108   PROT 6.8 12/01/2018 1511   ALBUMIN 4.4 03/09/2024 1108   AST 20 03/09/2024 1108   ALT 9 03/09/2024 1108   ALKPHOS 99 03/09/2024 1108   BILITOT <0.2 03/09/2024 1108   GFRNONAA >60 03/09/2024 1108   GFRAA >90 10/04/2012 1310      Lab Results  Component Value Date   WBC 5.8 03/09/2024   NEUTROABS 4.1 03/09/2024   HGB 11.0 (L) 03/09/2024   HCT 35.1 (L) 03/09/2024   MCV 84.2 03/09/2024   PLT 266 03/09/2024      RADIOGRAPHIC STUDIES: US  Abdomen Limited RUQ (LIVER/GB) Result Date: 02/24/2024 CLINICAL DATA:  SCREEN FOR HCC AND EVALUATE LIVER MORPHOLOGY. History of hepatitis-C EXAM: ULTRASOUND ABDOMEN LIMITED RIGHT UPPER QUADRANT COMPARISON:  December 18, 2022 FINDINGS: Gallbladder: Multiple mobile gallstones. No wall thickening visualized. No sonographic  Murphy sign noted by sonographer. Common bile duct: Diameter: Visualized portion measures 5 mm, within normal limits. Liver: No focal lesion identified. Heterogeneous, mildly increased and mildly coarsened in parenchymal echogenicity.  Portal vein is patent on color Doppler imaging with normal direction of blood flow towards the liver. Other: None. IMPRESSION: 1. Heterogeneous, mildly increased and mildly coarsened parenchymal echogenicity of the liver. Findings are nonspecific but can be seen in the setting of hepatic steatosis and/or underlying liver disease. No focal lesion identified. 2. Cholelithiasis without sonographic evidence of acute cholecystitis. Electronically Signed   By: Corean Salter M.D.   On: 02/24/2024 12:50

## 2024-03-07 NOTE — Assessment & Plan Note (Addendum)
-  T1bN0M0, Stage IA, G1 ductal carcinoma, ER+/PR+/HER2-,  diagnosed in 06/2020 -S/P lumpectomy and sentinel lymph node biopsy in June 2022 -Oncotype recurrence score 16 -She received adjuvant radiation -She has been on adjuvant anastrozole  since September 2022.  She is tolerating well, plan for 5-7 years -Patient is clinically doing well, exam unremarkable, lab reviewed, there is no clinical concern for recurrence. - Continue anastrozole  daily. -Plan for labs and follow-up in 6 months, sooner if needed.

## 2024-03-09 ENCOUNTER — Inpatient Hospital Stay: Admitting: Nurse Practitioner

## 2024-03-09 ENCOUNTER — Inpatient Hospital Stay: Attending: Nurse Practitioner

## 2024-03-09 VITALS — BP 139/85 | HR 66 | Temp 97.3°F | Resp 18 | Wt 167.9 lb

## 2024-03-09 DIAGNOSIS — C50412 Malignant neoplasm of upper-outer quadrant of left female breast: Secondary | ICD-10-CM | POA: Diagnosis present

## 2024-03-09 DIAGNOSIS — Z170 Estrogen receptor positive status [ER+]: Secondary | ICD-10-CM | POA: Diagnosis not present

## 2024-03-09 DIAGNOSIS — M8580 Other specified disorders of bone density and structure, unspecified site: Secondary | ICD-10-CM | POA: Insufficient documentation

## 2024-03-09 DIAGNOSIS — Z1721 Progesterone receptor positive status: Secondary | ICD-10-CM | POA: Diagnosis not present

## 2024-03-09 DIAGNOSIS — Z79899 Other long term (current) drug therapy: Secondary | ICD-10-CM | POA: Diagnosis not present

## 2024-03-09 DIAGNOSIS — Z79811 Long term (current) use of aromatase inhibitors: Secondary | ICD-10-CM | POA: Diagnosis not present

## 2024-03-09 DIAGNOSIS — D649 Anemia, unspecified: Secondary | ICD-10-CM | POA: Insufficient documentation

## 2024-03-09 DIAGNOSIS — Z923 Personal history of irradiation: Secondary | ICD-10-CM | POA: Diagnosis not present

## 2024-03-09 DIAGNOSIS — Z1732 Human epidermal growth factor receptor 2 negative status: Secondary | ICD-10-CM | POA: Diagnosis not present

## 2024-03-09 DIAGNOSIS — E2839 Other primary ovarian failure: Secondary | ICD-10-CM | POA: Diagnosis not present

## 2024-03-09 DIAGNOSIS — R232 Flushing: Secondary | ICD-10-CM | POA: Insufficient documentation

## 2024-03-09 DIAGNOSIS — E876 Hypokalemia: Secondary | ICD-10-CM | POA: Insufficient documentation

## 2024-03-09 LAB — CBC WITH DIFFERENTIAL (CANCER CENTER ONLY)
Abs Immature Granulocytes: 0.02 10*3/uL (ref 0.00–0.07)
Basophils Absolute: 0 10*3/uL (ref 0.0–0.1)
Basophils Relative: 0 %
Eosinophils Absolute: 0.1 10*3/uL (ref 0.0–0.5)
Eosinophils Relative: 2 %
HCT: 35.1 % — ABNORMAL LOW (ref 36.0–46.0)
Hemoglobin: 11 g/dL — ABNORMAL LOW (ref 12.0–15.0)
Immature Granulocytes: 0 %
Lymphocytes Relative: 23 %
Lymphs Abs: 1.3 10*3/uL (ref 0.7–4.0)
MCH: 26.4 pg (ref 26.0–34.0)
MCHC: 31.3 g/dL (ref 30.0–36.0)
MCV: 84.2 fL (ref 80.0–100.0)
Monocytes Absolute: 0.3 10*3/uL (ref 0.1–1.0)
Monocytes Relative: 5 %
Neutro Abs: 4.1 10*3/uL (ref 1.7–7.7)
Neutrophils Relative %: 70 %
Platelet Count: 266 10*3/uL (ref 150–400)
RBC: 4.17 MIL/uL (ref 3.87–5.11)
RDW: 16.4 % — ABNORMAL HIGH (ref 11.5–15.5)
WBC Count: 5.8 10*3/uL (ref 4.0–10.5)
nRBC: 0 % (ref 0.0–0.2)

## 2024-03-09 LAB — CMP (CANCER CENTER ONLY)
ALT: 9 U/L (ref 0–44)
AST: 20 U/L (ref 15–41)
Albumin: 4.4 g/dL (ref 3.5–5.0)
Alkaline Phosphatase: 99 U/L (ref 38–126)
Anion gap: 12 (ref 5–15)
BUN: 13 mg/dL (ref 8–23)
CO2: 30 mmol/L (ref 22–32)
Calcium: 10.3 mg/dL (ref 8.9–10.3)
Chloride: 100 mmol/L (ref 98–111)
Creatinine: 0.79 mg/dL (ref 0.44–1.00)
GFR, Estimated: >60 mL/min
Glucose, Bld: 108 mg/dL — ABNORMAL HIGH (ref 70–99)
Potassium: 2.9 mmol/L — ABNORMAL LOW (ref 3.5–5.1)
Sodium: 142 mmol/L (ref 135–145)
Total Bilirubin: <0.2 mg/dL (ref 0.0–1.2)
Total Protein: 7.8 g/dL (ref 6.5–8.1)

## 2024-03-09 MED ORDER — POTASSIUM CHLORIDE CRYS ER 20 MEQ PO TBCR: 20.0000 meq | EXTENDED_RELEASE_TABLET | Freq: Two times a day (BID) | ORAL | 0 refills | Status: AC

## 2024-03-09 MED ORDER — GABAPENTIN 300 MG PO CAPS: 300.0000 mg | ORAL_CAPSULE | Freq: Two times a day (BID) | ORAL | 1 refills | Status: AC

## 2024-03-12 ENCOUNTER — Encounter: Payer: Self-pay | Admitting: Nurse Practitioner

## 2024-09-07 ENCOUNTER — Inpatient Hospital Stay: Attending: Nurse Practitioner

## 2024-09-07 ENCOUNTER — Inpatient Hospital Stay: Admitting: Nurse Practitioner
